# Patient Record
Sex: Male | Born: 1945
Health system: Southern US, Community
[De-identification: ages and names within clinical notes are randomized; demographics above are authoritative.]

## PROBLEM LIST (undated history)

## (undated) DIAGNOSIS — N200 Calculus of kidney: Secondary | ICD-10-CM

## (undated) DIAGNOSIS — N1330 Unspecified hydronephrosis: Secondary | ICD-10-CM

## (undated) DIAGNOSIS — M1990 Unspecified osteoarthritis, unspecified site: Secondary | ICD-10-CM

## (undated) DIAGNOSIS — I219 Acute myocardial infarction, unspecified: Secondary | ICD-10-CM

## (undated) DIAGNOSIS — J45909 Unspecified asthma, uncomplicated: Secondary | ICD-10-CM

## (undated) DIAGNOSIS — E119 Type 2 diabetes mellitus without complications: Secondary | ICD-10-CM

## (undated) DIAGNOSIS — Z965 Presence of tooth-root and mandibular implants: Secondary | ICD-10-CM

## (undated) DIAGNOSIS — I10 Essential (primary) hypertension: Secondary | ICD-10-CM

## (undated) DIAGNOSIS — I252 Old myocardial infarction: Secondary | ICD-10-CM

## (undated) DIAGNOSIS — G709 Myoneural disorder, unspecified: Secondary | ICD-10-CM

## (undated) DIAGNOSIS — R0602 Shortness of breath: Secondary | ICD-10-CM

## (undated) DIAGNOSIS — K219 Gastro-esophageal reflux disease without esophagitis: Secondary | ICD-10-CM

## (undated) DIAGNOSIS — C61 Malignant neoplasm of prostate: Secondary | ICD-10-CM

## (undated) DIAGNOSIS — I2510 Atherosclerotic heart disease of native coronary artery without angina pectoris: Secondary | ICD-10-CM

## (undated) HISTORY — PX: HERNIA REPAIR: SHX51

## (undated) HISTORY — PX: PROSTATE BIOPSY: SHX241

## (undated) HISTORY — PX: BUNIONECTOMY: SHX129

---

## 1997-12-18 ENCOUNTER — Emergency Department (HOSPITAL_COMMUNITY): Admission: EM | Admit: 1997-12-18 | Discharge: 1997-12-18 | Payer: Self-pay | Admitting: Emergency Medicine

## 1998-12-12 ENCOUNTER — Emergency Department (HOSPITAL_COMMUNITY): Admission: EM | Admit: 1998-12-12 | Discharge: 1998-12-12 | Payer: Self-pay | Admitting: Emergency Medicine

## 1998-12-12 ENCOUNTER — Encounter: Payer: Self-pay | Admitting: Emergency Medicine

## 1999-05-23 ENCOUNTER — Encounter: Payer: Self-pay | Admitting: Cardiology

## 1999-05-23 ENCOUNTER — Observation Stay (HOSPITAL_COMMUNITY): Admission: EM | Admit: 1999-05-23 | Discharge: 1999-05-25 | Payer: Self-pay | Admitting: Emergency Medicine

## 1999-05-23 ENCOUNTER — Encounter: Payer: Self-pay | Admitting: Emergency Medicine

## 1999-05-24 ENCOUNTER — Encounter: Payer: Self-pay | Admitting: Cardiology

## 2003-04-28 ENCOUNTER — Emergency Department (HOSPITAL_COMMUNITY): Admission: EM | Admit: 2003-04-28 | Discharge: 2003-04-28 | Payer: Self-pay | Admitting: Emergency Medicine

## 2003-12-05 ENCOUNTER — Ambulatory Visit (HOSPITAL_COMMUNITY): Admission: RE | Admit: 2003-12-05 | Discharge: 2003-12-05 | Payer: Self-pay | Admitting: *Deleted

## 2007-05-16 ENCOUNTER — Encounter: Admission: RE | Admit: 2007-05-16 | Discharge: 2007-05-16 | Payer: Self-pay | Admitting: Cardiology

## 2008-05-16 ENCOUNTER — Inpatient Hospital Stay (HOSPITAL_COMMUNITY): Admission: RE | Admit: 2008-05-16 | Discharge: 2008-05-19 | Payer: Self-pay | Admitting: Orthopedic Surgery

## 2008-05-16 HISTORY — PX: TOTAL KNEE ARTHROPLASTY: SHX125

## 2009-03-20 ENCOUNTER — Inpatient Hospital Stay (HOSPITAL_COMMUNITY): Admission: RE | Admit: 2009-03-20 | Discharge: 2009-03-23 | Payer: Self-pay | Admitting: Orthopedic Surgery

## 2009-03-20 HISTORY — PX: TOTAL HIP ARTHROPLASTY: SHX124

## 2009-09-01 ENCOUNTER — Ambulatory Visit: Payer: Self-pay | Admitting: Internal Medicine

## 2009-09-01 ENCOUNTER — Inpatient Hospital Stay (HOSPITAL_COMMUNITY): Admission: EM | Admit: 2009-09-01 | Discharge: 2009-09-05 | Payer: Self-pay | Admitting: Emergency Medicine

## 2009-09-02 ENCOUNTER — Ambulatory Visit: Payer: Self-pay | Admitting: Infectious Diseases

## 2009-09-04 ENCOUNTER — Encounter (INDEPENDENT_AMBULATORY_CARE_PROVIDER_SITE_OTHER): Payer: Self-pay | Admitting: Cardiology

## 2009-09-18 ENCOUNTER — Encounter: Admission: RE | Admit: 2009-09-18 | Discharge: 2009-09-18 | Payer: Self-pay | Admitting: Cardiology

## 2010-06-28 LAB — COMPREHENSIVE METABOLIC PANEL
ALT: 96 U/L — ABNORMAL HIGH (ref 0–53)
BUN: 15 mg/dL (ref 6–23)
CO2: 22 mEq/L (ref 19–32)
Calcium: 8.6 mg/dL (ref 8.4–10.5)
Creatinine, Ser: 1.39 mg/dL (ref 0.4–1.5)
GFR calc non Af Amer: 51 mL/min — ABNORMAL LOW (ref >60)
Glucose, Bld: 154 mg/dL — ABNORMAL HIGH (ref 70–99)
Total Protein: 7.6 g/dL (ref 6.0–8.3)

## 2010-06-28 LAB — CBC
HCT: 30.8 % — ABNORMAL LOW (ref 39.0–52.0)
HCT: 35.5 % — ABNORMAL LOW (ref 39.0–52.0)
Hemoglobin: 12.2 g/dL — ABNORMAL LOW (ref 13.0–17.0)
MCHC: 34.4 g/dL (ref 30.0–36.0)
MCV: 92.6 fL (ref 78.0–100.0)
MCV: 94 fL (ref 78.0–100.0)
Platelets: 342 10*3/uL (ref 150–400)
RBC: 3.78 MIL/uL — ABNORMAL LOW (ref 4.22–5.81)
RDW: 14 % (ref 11.5–15.5)
RDW: 14.4 % (ref 11.5–15.5)

## 2010-06-28 LAB — CULTURE, BLOOD (ROUTINE X 2)
Culture: NO GROWTH
Culture: NO GROWTH

## 2010-06-28 LAB — BASIC METABOLIC PANEL
BUN: 13 mg/dL (ref 6–23)
BUN: 15 mg/dL (ref 6–23)
Chloride: 102 mEq/L (ref 96–112)
Chloride: 103 mEq/L (ref 96–112)
Creatinine, Ser: 1.32 mg/dL (ref 0.4–1.5)
GFR calc non Af Amer: 55 mL/min — ABNORMAL LOW (ref >60)
Glucose, Bld: 141 mg/dL — ABNORMAL HIGH (ref 70–99)
Glucose, Bld: 156 mg/dL — ABNORMAL HIGH (ref 70–99)
Potassium: 3.5 mEq/L (ref 3.5–5.1)
Potassium: 3.6 mEq/L (ref 3.5–5.1)

## 2010-06-28 LAB — URINALYSIS, ROUTINE W REFLEX MICROSCOPIC
Bilirubin Urine: NEGATIVE
Hgb urine dipstick: NEGATIVE
Nitrite: NEGATIVE
Protein, ur: 100 mg/dL — AB
Specific Gravity, Urine: 1.022 (ref 1.005–1.030)
Urobilinogen, UA: 1 mg/dL (ref 0.0–1.0)

## 2010-06-28 LAB — CARDIAC PANEL(CRET KIN+CKTOT+MB+TROPI)
CK, MB: 0.3 ng/mL (ref 0.3–4.0)
CK, MB: 0.7 ng/mL (ref 0.3–4.0)
CK, MB: 1 ng/mL (ref 0.3–4.0)
Total CK: 68 U/L (ref 7–232)

## 2010-06-28 LAB — HEMOGLOBIN A1C: Hgb A1c MFr Bld: 7.8 % — ABNORMAL HIGH (ref <5.7)

## 2010-06-28 LAB — GLUCOSE, CAPILLARY
Glucose-Capillary: 144 mg/dL — ABNORMAL HIGH (ref 70–99)
Glucose-Capillary: 145 mg/dL — ABNORMAL HIGH (ref 70–99)
Glucose-Capillary: 163 mg/dL — ABNORMAL HIGH (ref 70–99)
Glucose-Capillary: 164 mg/dL — ABNORMAL HIGH (ref 70–99)
Glucose-Capillary: 173 mg/dL — ABNORMAL HIGH (ref 70–99)
Glucose-Capillary: 177 mg/dL — ABNORMAL HIGH (ref 70–99)
Glucose-Capillary: 187 mg/dL — ABNORMAL HIGH (ref 70–99)
Glucose-Capillary: 197 mg/dL — ABNORMAL HIGH (ref 70–99)
Glucose-Capillary: 226 mg/dL — ABNORMAL HIGH (ref 70–99)

## 2010-06-28 LAB — IRON AND TIBC
Saturation Ratios: 17 % — ABNORMAL LOW (ref 20–55)
TIBC: 184 ug/dL — ABNORMAL LOW (ref 215–435)

## 2010-06-28 LAB — URINE MICROSCOPIC-ADD ON

## 2010-06-28 LAB — DIFFERENTIAL
Eosinophils Absolute: 0 10*3/uL (ref 0.0–0.7)
Lymphocytes Relative: 11 % — ABNORMAL LOW (ref 12–46)
Lymphs Abs: 0.9 10*3/uL (ref 0.7–4.0)
Monocytes Relative: 10 % (ref 3–12)
Neutro Abs: 6.5 10*3/uL (ref 1.7–7.7)
Neutrophils Relative %: 80 % — ABNORMAL HIGH (ref 43–77)

## 2010-06-28 LAB — PROTIME-INR
INR: 1.02 (ref 0.00–1.49)
INR: 1.08 (ref 0.00–1.49)
Prothrombin Time: 14.4 seconds (ref 11.6–15.2)

## 2010-06-28 LAB — FERRITIN: Ferritin: 948 ng/mL — ABNORMAL HIGH (ref 22–322)

## 2010-06-28 LAB — RETICULOCYTES: Retic Ct Pct: 0.4 % (ref 0.4–3.1)

## 2010-07-13 LAB — COMPREHENSIVE METABOLIC PANEL
ALT: 18 U/L (ref 0–53)
AST: 19 U/L (ref 0–37)
Albumin: 4.1 g/dL (ref 3.5–5.2)
CO2: 28 mEq/L (ref 19–32)
Calcium: 9.2 mg/dL (ref 8.4–10.5)
GFR calc Af Amer: >60 mL/min (ref >60)
Sodium: 140 mEq/L (ref 135–145)
Total Protein: 7.6 g/dL (ref 6.0–8.3)

## 2010-07-13 LAB — GLUCOSE, CAPILLARY
Glucose-Capillary: 113 mg/dL — ABNORMAL HIGH (ref 70–99)
Glucose-Capillary: 117 mg/dL — ABNORMAL HIGH (ref 70–99)
Glucose-Capillary: 126 mg/dL — ABNORMAL HIGH (ref 70–99)
Glucose-Capillary: 133 mg/dL — ABNORMAL HIGH (ref 70–99)
Glucose-Capillary: 135 mg/dL — ABNORMAL HIGH (ref 70–99)
Glucose-Capillary: 136 mg/dL — ABNORMAL HIGH (ref 70–99)
Glucose-Capillary: 137 mg/dL — ABNORMAL HIGH (ref 70–99)
Glucose-Capillary: 145 mg/dL — ABNORMAL HIGH (ref 70–99)
Glucose-Capillary: 146 mg/dL — ABNORMAL HIGH (ref 70–99)
Glucose-Capillary: 148 mg/dL — ABNORMAL HIGH (ref 70–99)
Glucose-Capillary: 159 mg/dL — ABNORMAL HIGH (ref 70–99)
Glucose-Capillary: 164 mg/dL — ABNORMAL HIGH (ref 70–99)

## 2010-07-13 LAB — TYPE AND SCREEN: ABO/RH(D): A POS

## 2010-07-13 LAB — BASIC METABOLIC PANEL WITH GFR
BUN: 5 mg/dL — ABNORMAL LOW (ref 6–23)
CO2: 30 meq/L (ref 19–32)
Calcium: 8.5 mg/dL (ref 8.4–10.5)
Chloride: 101 meq/L (ref 96–112)
Creatinine, Ser: 0.74 mg/dL (ref 0.4–1.5)
GFR calc non Af Amer: >60 mL/min
Glucose, Bld: 162 mg/dL — ABNORMAL HIGH (ref 70–99)
Potassium: 3.7 meq/L (ref 3.5–5.1)
Sodium: 136 meq/L (ref 135–145)

## 2010-07-13 LAB — PROTIME-INR
INR: 1.46 (ref 0.00–1.49)
Prothrombin Time: 17.6 s — ABNORMAL HIGH (ref 11.6–15.2)

## 2010-07-13 LAB — CBC
HCT: 27.5 % — ABNORMAL LOW (ref 39.0–52.0)
HCT: 29.7 % — ABNORMAL LOW (ref 39.0–52.0)
HCT: 30.5 % — ABNORMAL LOW (ref 39.0–52.0)
Hemoglobin: 10 g/dL — ABNORMAL LOW (ref 13.0–17.0)
Hemoglobin: 10.4 g/dL — ABNORMAL LOW (ref 13.0–17.0)
MCHC: 33.7 g/dL (ref 30.0–36.0)
MCHC: 33.8 g/dL (ref 30.0–36.0)
MCHC: 34.1 g/dL (ref 30.0–36.0)
MCV: 96.3 fL (ref 78.0–100.0)
MCV: 96.4 fL (ref 78.0–100.0)
MCV: 96.4 fL (ref 78.0–100.0)
Platelets: 330 10*3/uL (ref 150–400)
Platelets: 361 K/uL (ref 150–400)
Platelets: 447 10*3/uL — ABNORMAL HIGH (ref 150–400)
RBC: 3.09 MIL/uL — ABNORMAL LOW (ref 4.22–5.81)
RBC: 3.85 MIL/uL — ABNORMAL LOW (ref 4.22–5.81)
RDW: 13.2 % (ref 11.5–15.5)
RDW: 13.3 % (ref 11.5–15.5)
RDW: 13.3 % (ref 11.5–15.5)
WBC: 5.1 K/uL (ref 4.0–10.5)
WBC: 5.7 10*3/uL (ref 4.0–10.5)

## 2010-07-13 LAB — URINALYSIS, ROUTINE W REFLEX MICROSCOPIC
Bilirubin Urine: NEGATIVE
Glucose, UA: NEGATIVE mg/dL
Hgb urine dipstick: NEGATIVE
Ketones, ur: NEGATIVE mg/dL
Nitrite: NEGATIVE
Protein, ur: NEGATIVE mg/dL
Specific Gravity, Urine: 1.023 (ref 1.005–1.030)
Urobilinogen, UA: 1 mg/dL (ref 0.0–1.0)
pH: 6 (ref 5.0–8.0)

## 2010-07-13 LAB — BASIC METABOLIC PANEL
BUN: 5 mg/dL — ABNORMAL LOW (ref 6–23)
CO2: 29 mEq/L (ref 19–32)
Chloride: 102 mEq/L (ref 96–112)
Glucose, Bld: 149 mg/dL — ABNORMAL HIGH (ref 70–99)
Potassium: 3.5 mEq/L (ref 3.5–5.1)

## 2010-07-13 LAB — APTT: aPTT: 27 s (ref 24–37)

## 2010-07-27 LAB — COMPREHENSIVE METABOLIC PANEL
ALT: 33 U/L (ref 0–53)
AST: 32 U/L (ref 0–37)
Albumin: 4 g/dL (ref 3.5–5.2)
Alkaline Phosphatase: 77 U/L (ref 39–117)
Chloride: 106 mEq/L (ref 96–112)
GFR calc Af Amer: >60 mL/min (ref >60)
Potassium: 3.4 mEq/L — ABNORMAL LOW (ref 3.5–5.1)
Total Bilirubin: 0.7 mg/dL (ref 0.3–1.2)

## 2010-07-27 LAB — BASIC METABOLIC PANEL
BUN: 4 mg/dL — ABNORMAL LOW (ref 6–23)
CO2: 27 mEq/L (ref 19–32)
CO2: 28 mEq/L (ref 19–32)
Calcium: 8.5 mg/dL (ref 8.4–10.5)
Chloride: 99 mEq/L (ref 96–112)
Chloride: 98 mEq/L (ref 96–112)
Creatinine, Ser: 0.76 mg/dL (ref 0.4–1.5)
GFR calc Af Amer: >60 mL/min (ref >60)
Glucose, Bld: 137 mg/dL — ABNORMAL HIGH (ref 70–99)
Sodium: 135 mEq/L (ref 135–145)

## 2010-07-27 LAB — URINALYSIS, ROUTINE W REFLEX MICROSCOPIC
Nitrite: NEGATIVE
Specific Gravity, Urine: 1.019 (ref 1.005–1.030)
pH: 5.5 (ref 5.0–8.0)

## 2010-07-27 LAB — CBC
HCT: 41 % (ref 39.0–52.0)
HCT: 32.9 % — ABNORMAL LOW (ref 39.0–52.0)
Hemoglobin: 11.5 g/dL — ABNORMAL LOW (ref 13.0–17.0)
MCHC: 34.5 g/dL (ref 30.0–36.0)
MCHC: 34.8 g/dL (ref 30.0–36.0)
MCV: 94.9 fL (ref 78.0–100.0)
Platelets: 377 10*3/uL (ref 150–400)
Platelets: 329 10*3/uL (ref 150–400)
Platelets: 348 10*3/uL (ref 150–400)
RBC: 3.14 MIL/uL — ABNORMAL LOW (ref 4.22–5.81)
RBC: 3.47 MIL/uL — ABNORMAL LOW (ref 4.22–5.81)
RDW: 13.2 % (ref 11.5–15.5)
WBC: 3.8 10*3/uL — ABNORMAL LOW (ref 4.0–10.5)
WBC: 5.8 10*3/uL (ref 4.0–10.5)
WBC: 6.5 10*3/uL (ref 4.0–10.5)

## 2010-07-27 LAB — GLUCOSE, CAPILLARY
Glucose-Capillary: 126 mg/dL — ABNORMAL HIGH (ref 70–99)
Glucose-Capillary: 140 mg/dL — ABNORMAL HIGH (ref 70–99)
Glucose-Capillary: 137 mg/dL — ABNORMAL HIGH (ref 70–99)
Glucose-Capillary: 138 mg/dL — ABNORMAL HIGH (ref 70–99)
Glucose-Capillary: 158 mg/dL — ABNORMAL HIGH (ref 70–99)
Glucose-Capillary: 162 mg/dL — ABNORMAL HIGH (ref 70–99)

## 2010-07-27 LAB — PROTIME-INR
INR: 1.2 (ref 0.00–1.49)
Prothrombin Time: 19.8 seconds — ABNORMAL HIGH (ref 11.6–15.2)

## 2010-07-27 LAB — TYPE AND SCREEN

## 2010-08-24 NOTE — Op Note (Signed)
NAMEPRESTON, [name] NO.:  [id number]   MEDICAL RECORD NO.:  [id number]          PATIENT TYPE:  INP   LOCATION:  0003                         FACILITY:  [facility]   PHYSICIAN:  Ollen Gross, M.D.    DATE OF BIRTH:  1946/03/27   DATE OF PROCEDURE:  05/16/2008  DATE OF DISCHARGE:                               OPERATIVE REPORT   PREOPERATIVE DIAGNOSIS:  Osteoarthritis left knee.   POSTOPERATIVE DIAGNOSIS:  Osteoarthritis left knee.   PROCEDURE:  Left total knee arthroplasty.   SURGEON:  Ollen Gross, M.D.   ASSISTANT:  Avel Peace PA-C.   ANESTHESIA:  Spinal.   ESTIMATED BLOOD LOSS:  Minimal.   DRAIN:  None.   TOURNIQUET TIME:  Forty minutes at 300 mmHg.   COMPLICATIONS:  None.   CONDITION:  Stable to recovery.   BRIEF CLINICAL NOTE:  [name] is a 65 year old male with end-stage  arthritis of the left knee with progressively worsening pain and  dysfunction.  He has failed nonoperative management and presents now for  left total knee arthroplasty.   PROCEDURE IN DETAIL:  After the successful administration of spinal  anesthetic, a tourniquet is placed on his left thigh and his left lower  extremity is prepped and draped in the usual sterile fashion.  The  extremity is wrapped in Esmarch, knee flexed, tourniquet inflated to 300  mmHg.  A midline incision is made with a 10-blade through subcutaneous  tissue to the level of the extensor mechanism.  A fresh blade is used  make a medial parapatellar arthrotomy.  Soft tissue over the proximal  and medial tibia is subperiosteally elevated to the joint line with the  knife and into the semimembranosus bursa with a Cobb elevator.  Soft  tissue laterally is elevated with attention being paid to avoiding the  patellar tendon on the tibial tubercle.  Patella is subluxed laterally,  knee flexed 90 degrees in ACL and PCL and removed.  A drill was used to  create a starting hole in the distal femur and the canal  was thoroughly  irrigated.  The 5 degree left valgus alignment guide is placed and  referencing off the posterior condyle rotation is marked with a black  pen to remove 10-mm off the distal femur.  Distal femoral resection is  made an oscillating saw.  A sizing block is placed, size 4 is most  appropriate.  Rotations marked off the epicondylar axis.  A size 4  cutting block is placed and the anterior, posterior and chamfer cuts  made.   Tibia subluxed forward and menisci removed.  Extramedullary tibial  alignment guide is placed referencing proximally at the medial aspect of  the tibial tubercle and distally along the second metatarsal axis and  tibial crest.  Blocks pinned to remove 10-mm off the non-deficient  lateral side.  Tibial resection is made with an oscillating saw.  The  size 4 is the most appropriate tibial component and the proximal tibia  is prepared with the modular drill and keel punch for the size 4.  Femoral preparation is completed with the intercondylar  cut for the 4.   A size 4 mobile bearing tibial trial, size 4 posterior stabilized  femoral trial and a 10-mm posterior stabilized rotating platform insert  trial are placed.  With a 10, there was slight hyperextension, with a  12.5 which allowed for full extension with excellent varus-valgus  anterior and posterior balance throughout full range of motion.  Patella  was everted, thickness measured to be 25-mm.  Freehand resection was  taken to 13-mm, a 41 template is placed, lug holes are drilled, trial  patella is placed and it tracks normally.  Osteophytes removed off of  the posterior femur with the trial in place.  All trials were removed  and a cut bone surface is prepared with pulsatile lavage.  Cement is  mixed and once ready for implantation the size 4 mobile-bearing tibia,  size 4 posterior stabilized femur and 41 patella are cemented into  place, patella was held with a clamp.  Trial 12.5-mm inserts  placed,  knee held in full extension and all extruded cement removed.  When the  cement was fully hardened, then the permanent 12.5-mm posterior  stabilized rotating platform insert is placed into the tibial tray care.  The wound was copiously irrigated with saline solution.  FloSeal was  injected on the posterior capsule in medial and lateral gutters and  suprapatellar area.  Moist sponge is placed and tourniquet released with  a total time of 4 minutes.  Sponge is held for 2 minutes then removed.  Minimal bleeding is encountered.  The bleeding edges encountered stopped  by electrocautery.  The wound is again irrigated and the arthrotomy  closed with interrupted #1 PDS.  Flexion against gravity is 135 degrees.  Subcu is closed with interrupted 2-0 Vicryl and subcuticular with  running 4-0 Monocryl.  The incision is then cleaned and dried and Steri-  Strips and a bulky sterile dressing are applied.  He is then placed into  a knee immobilizer, awakened and transported to recovery in stable  condition.      Ollen Gross, M.D.  Electronically Signed     FA/MEDQ  D:  05/16/2008  T:  05/16/2008  Job:  [id number]

## 2010-08-27 NOTE — Discharge Summary (Signed)
NAMETAREEK, SABO              ACCOUNT NO.:  [id number]   MEDICAL RECORD NO.:  [id number]          PATIENT TYPE:  INP   LOCATION:  1616                         FACILITY:  [facility]   PHYSICIAN:  Ollen Gross, M.D.    DATE OF BIRTH:  February 25, 1946   DATE OF ADMISSION:  05/16/2008  DATE OF DISCHARGE:  05/19/2008                               DISCHARGE SUMMARY   ADMISSION DIAGNOSES:  1. Osteoarthritis left knee.  2. Hypertension.  3. Coronary arterial disease.  4. Hypercholesterolemia.  5. History of myocardial infarction.  6. Diabetes.  7. Arthritis.   DISCHARGE DIAGNOSES:  1. Osteoarthritis left knee status post left total knee replacement      arthroplasty.  2. Mild postoperative acute blood loss anemia.  3. Mild hyponatremia.  4. Hypertension.  5. Coronary arterial disease.  6. Hypercholesterolemia.  7. History of myocardial infarction.  8. Diabetes.  9. Arthritis.   PROCEDURE:  May 16, 2008, left total knee.   SURGEON:  Ollen Gross, M.D.   ASSISTANT:  Alexzandrew L. Perkins, P.A.C.   ANESTHESIA:  Spinal anesthesia with Duramorph added.  Tourniquet time 40  minutes.   CONSULTS:  None.   BRIEF HISTORY:  Mr. Dusenbery is a 65 year old male with end-stage arthritis  of the left knee, progressive worsening pain and dysfunction, failed  operative management and now presents for a total knee arthroplasty.   LABORATORY DATA:  Preoperative CBC:  Hemoglobin 13.9, hematocrit 41.0,  white cell count 3.8, platelets 377, hemoglobin 11.5 and drifted down to  11.4.  Last known H and H 10.3 and 30.0.  PT/PTT preoperative 13.2 and  25 respectively.  INR 1.0.  Serial pro-times followed.  Last known  PT/INR 23.6 and 2.0.  Chem panel on admission:  Minimally low potassium  at 3.4, but did come back up to a normal level of 4.  Remaining chem  panel within normal limits.  Sodium did drop from 135 to 132.  Preop UA  positive glucose, otherwise negative.  Blood group type A+.   DIAGNOSTICS:  Previous EKG on the chart dated March 17, 2008:  Normal  sinus rhythm within normal limits.  History of coronary arterial  disease, confirmed.   HOSPITAL COURSE:  The patient was admitted to [facility],  taken to the OR and underwent the above stated procedure without  complication.  The patient tolerated the procedure well.  Later  transferred to the recovery room on the orthopedic floor.  Started on  PCA and p.o. analgesic for pain control following surgery.  Given 24  hours postoperative IV antibiotics. Given Coumadin for DVT prophylaxis.  Actually the patient did very well and was sitting up on the side of  bed.  On day 1, started walking about 160 feet.  Weaned over to p.o.  meds by day 2.  PCA was discontinued.  Dressing changed and incision  looked good.  Hemoglobin was stable.  No complaints.  By day 3, the  patient had met all of his goals, walking over 145 feet.  Weaned over to  p.o. meds.  Hemoglobin was stable at 10.3, tolerating  his meds and  discharged home.   DISPOSITION:  The patient was discharged home on May 19, 2008.   DISCHARGE MEDICATIONS:  1. Percocet.  2. Robaxin.  3. Coumadin.   DIET:  Heart-healthy cardiac diet.   ACTIVITY:  Weightbearing as tolerated total knee protocol.  Home health  PT and home health nursing.   FOLLOW UP:  Followup in 2 weeks.   DISPOSITION:  Home.   CONDITION ON DISCHARGE:  Improved.      Alexzandrew L. Perkins, P.A.C.      Ollen Gross, M.D.  Electronically Signed    ALP/MEDQ  D:  06/19/2008  T:  06/19/2008  Job:  [id number]   cc:   Ollen Gross, M.D.  Fax: [phone]   Lindaann Slough, MD   Karie Soda Joseph Art, M.D.  Fax: [phone]

## 2010-08-27 NOTE — H&P (Signed)
NAMEPARV, MANTHEY NO.:  [id number]   MEDICAL RECORD NO.:  [id number]          PATIENT TYPE:  INP   LOCATION:  0003                         FACILITY:  [facility]   PHYSICIAN:  Ollen Gross, M.D.    DATE OF BIRTH:  1945/04/20   DATE OF ADMISSION:  05/16/2008  DATE OF DISCHARGE:                              HISTORY & PHYSICAL   CHIEF COMPLAINT:  Left knee pain.   HISTORY OF PRESENT ILLNESS:  The patient is a 65 year old male who has  been seeing Dr. Lequita Halt for ongoing left knee pain.  He has been treated  conservatively in the past for significant arthritis, had multiple  cortisone injections, also treated by viscosupplementation;  unfortunately he has not gotten good benefit.  He is known to have  advanced arthritis and felt he has reached the point where he could  benefit from undergoing surgical intervention.  Risks and benefits have  been discussed, he elected to proceed with surgery.  He has been seen  preoperatively by Dr. Nile Riggs, recommended that we check his blood  sugars and watch his blood pressure but otherwise cleared for surgery.   ALLERGIES:  NO KNOWN DOCUMENTED ALLERGIES.   CURRENT MEDICATIONS:  Caduet, carvedilol, Avalide, metformin,  diclofenac.   PAST MEDICAL HISTORY:  1. Hypertension.  2. Coronary arterial disease/heart disease.  3. Hypercholesterolemia.  4. History of myocardial infarction.  5. Diabetes.  6. Arthritis.   PAST SURGICAL HISTORY:  Undergone bypass surgery and bunion extraction.   FAMILY HISTORY:  Father with history of cancer, heart disease, diabetes,  congestive heart failure.  Mother with history of cancer and also broken  hip.   SOCIAL HISTORY:  Widowed, retired, current smoker, 12 ounces of beer a  day, lives alone.   REVIEW OF SYSTEMS:  GENERAL:  No fevers, chills (although he does have  occasional night sweats).  NEURO:  Sometimes ringing in his ears, no  seizures, syncope or paralysis.  RESPIRATORY:  A little  bit of shortness  of breath on exertion but no shortness of breath at rest.  No productive  cough or hemoptysis.  CARDIOVASCULAR:  No chest pain or orthopnea.  GI:  No nausea, vomiting or constipation.  GU:  A little bit of nocturia, no  dysuria or hematuria.  MUSCULOSKELETAL:  Joint pain and stiffness.   PHYSICAL EXAMINATION:  VITAL SIGNS:  Pulse 72, respirations 12, blood  pressure 160/80.  GENERAL:  65 year old African American male, tall frame, well-nourished,  well-developed, no acute distress.  HEENT:  Normocephalic/atraumatic, pupils are round and reactive, EOMs  intact, noted to wear full upper dentures.  NECK:  Supple.  CHEST:  Clear.  HEART:  Regular rhythm, mo murmur, S1-S2 noted.  ABDOMEN:  Soft, nontender, bowel sounds present.  Rectal, breasts, genitalia not done, not pertinent to present illness.  EXTREMITIES:  Left knee marked crepitus, range of motion 5 to 125, no  instability.   IMPRESSION:  Osteoarthritis left knee.   PLAN:  The patient admitted to [facility] to undergo a left  total knee replacement arthroplasty.  Surgery will be performed by Dr.  Homero Fellers Aluisio.      Alexzandrew L. Perkins, P.A.C.      Ollen Gross, M.D.  Electronically Signed    ALP/MEDQ  D:  05/15/2008  T:  05/16/2008  Job:  [id number]   cc:   Dr. Ralph Dowdy. Joseph Art, M.D.  Fax: [phone]   Ollen Gross, M.D.  Fax: [phone]

## 2011-10-10 ENCOUNTER — Encounter (HOSPITAL_COMMUNITY): Payer: Self-pay | Admitting: *Deleted

## 2011-10-10 ENCOUNTER — Emergency Department (HOSPITAL_COMMUNITY)
Admission: EM | Admit: 2011-10-10 | Discharge: 2011-10-10 | Disposition: A | Discharge status: Home / Self Care | Payer: Medicare PPO | Attending: Emergency Medicine | Admitting: Emergency Medicine

## 2011-10-10 DIAGNOSIS — E119 Type 2 diabetes mellitus without complications: Secondary | ICD-10-CM | POA: Insufficient documentation

## 2011-10-10 DIAGNOSIS — I10 Essential (primary) hypertension: Secondary | ICD-10-CM | POA: Insufficient documentation

## 2011-10-10 DIAGNOSIS — L290 Pruritus ani: Secondary | ICD-10-CM | POA: Insufficient documentation

## 2011-10-10 HISTORY — DX: Essential (primary) hypertension: I10

## 2011-10-10 HISTORY — DX: Unspecified osteoarthritis, unspecified site: M19.90

## 2011-10-10 MED ORDER — MEBENDAZOLE 100 MG PO CHEW: 100.0000 mg | CHEWABLE_TABLET | Freq: Once | ORAL | Status: AC

## 2011-10-10 MED ORDER — HYDROCORTISONE ACETATE 25 MG RE SUPP: 25.0000 mg | Freq: Two times a day (BID) | RECTAL | Status: AC

## 2011-10-10 NOTE — ED Provider Notes (Signed)
History     CSN: [id number]  Arrival date & time 10/10/11  1648   First MD Initiated Contact with Patient 10/10/11 1759      Chief Complaint  Patient presents with  . Anal Itching    (Consider location/radiation/quality/duration/timing/severity/associated sxs/prior treatment) Patient is a 66 y.o. male presenting with vaginal itching. The history is provided by the patient.  Vaginal Itching The current episode started more than 1 month ago. The problem occurs daily. The problem has been unchanged. Pertinent negatives include no abdominal pain, change in bowel habit, nausea or rash. The treatment provided no relief.   66 y/o male INAD cc/o rectal itching x1 month. Denies pain with defecation, and change in bowel habits, or any blood in stool or dark stool. Itching is significantly worse at night. Pt say PCP  Today who referred him to GI for colonoscopy but provided no symptomatic relief.  Pt States PCP performed DRE and stated that Pt's hemorrhoids are swollen.   Past Medical History  Diagnosis Date  . Hypertension   . Diabetes mellitus   . Arthritis     Past Surgical History  Procedure Date  . Total hip arthroplasty   . Total knee arthroplasty     No family history on file.  History  Substance Use Topics  . Smoking status: Never Smoker   . Smokeless tobacco: Not on file  . Alcohol Use: Yes     socially      Review of Systems  Gastrointestinal: Negative for nausea, abdominal pain, diarrhea, constipation, blood in stool, anal bleeding, rectal pain and change in bowel habit.  Skin: Negative for rash.  All other systems reviewed and are negative.    Allergies  Review of patient's allergies indicates no known allergies.  Home Medications   Current Outpatient Rx  Name Route Sig Dispense Refill  . AMLODIPINE BESYLATE 10 MG PO TABS Oral Take 10 mg by mouth daily.    . ATORVASTATIN CALCIUM 20 MG PO TABS Oral Take 20 mg by mouth daily.    Marland Kitchen CARVEDILOL 25 MG PO TABS  Oral Take 25 mg by mouth 2 (two) times daily with a meal.    . GLIMEPIRIDE 4 MG PO TABS Oral Take 4 mg by mouth daily before breakfast.    . METFORMIN HCL 500 MG PO TABS Oral Take 1,000 mg by mouth daily.    Marland Kitchen HYDROCORTISONE ACETATE 25 MG RE SUPP Rectal Place 1 suppository (25 mg total) rectally 2 (two) times daily. 12 suppository 0  . MEBENDAZOLE 100 MG PO CHEW Oral Chew 1 tablet (100 mg total) by mouth once. You make take the second tablet in 14 days if itching dose not subside. 1 tablet 1    BP 166/79  Pulse 79  Temp 98.4 F (36.9 C) (Oral)  Resp 24  SpO2 98%  Physical Exam  Vitals reviewed. Constitutional: He is oriented to person, place, and time. He appears well-developed and well-nourished. No distress.  HENT:  Head: Normocephalic.  Eyes: Conjunctivae and EOM are normal.  Cardiovascular: Normal rate, regular rhythm and normal heart sounds.   Pulmonary/Chest: Effort normal.  Abdominal: Soft. Bowel sounds are normal.  Musculoskeletal: Normal range of motion.  Neurological: He is alert and oriented to person, place, and time.  Psychiatric: He has a normal mood and affect.    ED Course  Procedures (including critical care time)  Labs Reviewed - No data to display No results found.   1. Rectal itching  MDM  66 y/o with rectal itching x4 weeks, significantly worse at night. Pt has h/o hemorrhoids. Denies and rectal pain, change in bowel habits or blood in stool. Pt had colonoscopy scheduled for August but would like symptomatic relief. Will treat for both pinworm and hemorrhoids. Explained that Dx is not proven and Pt opted for treatment for both.  Pt verbalized understanding and agrees with care plan. Outpatient follow-up and return precautions given.          Wynetta Emery, PA-C 10/10/11 1832

## 2011-10-10 NOTE — Discharge Instructions (Signed)
Follow with Dr. Lona Kettle for colonoscopy in August.   Anal Itching Itching around the anus is a common problem. It is usually not dangerous. It often is caused by skin irritation from stool, moisture, soaps, or clothing. Other causes are pinworms, especially if the itching is worse at night. In adults, the itching may be due to hemorrhoids. In some cases, the cause is unknown. Itching usually can be controlled by keeping the anal area clean and dry. CAUSES   Loose or sticky stool from diarrhea or rectal leakage.   Hemorrhoids. They allow stool to stick to the rectal area.   Certain foods. Be sure to discuss your diet with your caregiver.   Dry skin or skin diseases.   Infections such as a local yeast infection or certain sexually transmitted diseases (STDs).   Worms (parasites).   Diseases of the anus. These include abscesses, fissures, fistulas or cancer.   Sometimes a cause cannot be found.  DIAGNOSIS   Your caregiver will take your history and examine you. A careful exam of the anus is important. Your caregiver will inspect the outer area of your anus and will do a rectal exam.   Sometimes your caregiver will need to look inside the anus. This is a simple procedure that may be a little uncomfortable but usually does not require anesthesia.   If abnormalities are found, a biopsy might be done or you may be referred to a specialist.  TREATMENT  The treatment of your condition will depend on the cause.   Your caregiver will advise you on treatment of any disease found.   If you have rectal leakage or loose stools, a diet high in fiber or a fiber supplement should improve your condition.   Avoid foods or substances that might be causing your itching.   Gentle care of your anal area is important to avoid worsening the irritation.  HOME CARE INSTRUCTIONS  Do not rub or scratch the area. This makes the itching worse. It could worsen conditions such as parasite infections.   After  every bowel movement and at bedtime, gently clean the anal area. Bathe or use moistened tissue or soft wash cloth. You also may use pre-moistened anal cleansing pads or tissues made for cleaning up babies. Do not use soap. Gently pat the area dry.   Wear underwear made of cotton or with a cotton crotch. Do not wear tight fitting clothes or underwear that keep moisture in.   Avoid foods and beverages that may cause anal itching. Examples are beer, tea, coffee, milk, cola, tomatoes, citrus fruits, nuts, chocolate, and spicy foods.   Be sure you have enough fiber in your diet.   Do not use products that may irritate the anal skin. These include perfumed or colored toilet paper, deodorant sprays, and perfumed soaps.   Do not use any medication on the anal area unless advised. Some products may make itching worse.   It may take a few weeks for things to fully improve.  SEEK MEDICAL CARE IF:   The itching is not better in 3 to 4 days or is getting worse.   The skin around the anus becomes red or tender. This may be a sign of infection.   You have pain in the anus, especially with bowel movement.  SEEK IMMEDIATE MEDICAL CARE IF:  You have increasing pain in the anus or in the abdomen.   You have blood coming from the anus.   You have pus or other discharge  from the anus.   You develop a fever.  Document Released: 03/25/2000 Document Revised: 03/17/2011 Document Reviewed: 04/01/2008 [facility] Patient Information 2012 [place], [place].

## 2011-10-10 NOTE — Progress Notes (Signed)
Pt confirms [name] as pcp updated EPIC

## 2011-10-10 NOTE — ED Notes (Signed)
Pharm D (Mia) called because Vermox is no longer available at [facility] or any other pharmacy she called.  I spoke with Benn Moulder PA and she changed the order to Albenza 400 mg tab x 1 dose and then repeat in 2 weeks.  This was called back to Mia at [facility] at [phone]

## 2011-10-10 NOTE — ED Notes (Signed)
Pt reports anal itching x1 month. States he saw pcp today, told pt he could possibly have hemorrhoids or pin-worms. States he has used powder to treat itching, thought rectum was "chapped".  Denies pain

## 2011-10-12 NOTE — ED Provider Notes (Signed)
Medical screening examination/treatment/procedure(s) were performed by non-physician practitioner and as supervising physician I was immediately available for consultation/collaboration.  Suzi Roots, MD 10/12/11 [phone]

## 2012-02-23 ENCOUNTER — Other Ambulatory Visit: Payer: Self-pay | Admitting: Orthopedic Surgery

## 2012-02-23 MED ORDER — DEXAMETHASONE SODIUM PHOSPHATE 10 MG/ML IJ SOLN: 10.0000 mg | Freq: Once | INTRAMUSCULAR | Status: DC

## 2012-02-23 MED ORDER — BUPIVACAINE LIPOSOME 1.3 % IJ SUSP
20.0000 mL | Freq: Once | INTRAMUSCULAR | Status: DC
Start: 2012-02-23 — End: 2012-02-23

## 2012-02-23 NOTE — Progress Notes (Signed)
Preoperative surgical orders have been place into the Epic hospital system for BEAUDEN TREMONT on 02/23/2012, 11:05 AM  by Patrica Duel for surgery on 04/09/12.  Preop Total Hip orders including Experel Injecion, IV Tylenol, and IV Decadron as long as there are no contraindications to the above medications. Avel Peace, PA-C

## 2012-04-02 ENCOUNTER — Encounter (HOSPITAL_COMMUNITY)
Admission: RE | Admit: 2012-04-02 | Discharge: 2012-04-02 | Disposition: A | Discharge status: Home / Self Care | Payer: Medicare PPO | Source: Ambulatory Visit | Attending: Orthopedic Surgery | Admitting: Orthopedic Surgery

## 2012-04-02 ENCOUNTER — Ambulatory Visit (HOSPITAL_COMMUNITY)
Admission: RE | Admit: 2012-04-02 | Discharge: 2012-04-02 | Disposition: A | Discharge status: Home / Self Care | Payer: Medicare PPO | Source: Ambulatory Visit | Attending: Orthopedic Surgery | Admitting: Orthopedic Surgery

## 2012-04-02 ENCOUNTER — Encounter (HOSPITAL_COMMUNITY): Payer: Self-pay

## 2012-04-02 DIAGNOSIS — Z01818 Encounter for other preprocedural examination: Secondary | ICD-10-CM | POA: Insufficient documentation

## 2012-04-02 HISTORY — DX: Acute myocardial infarction, unspecified: I21.9

## 2012-04-02 HISTORY — DX: Shortness of breath: R06.02

## 2012-04-02 HISTORY — DX: Myoneural disorder, unspecified: G70.9

## 2012-04-02 LAB — COMPREHENSIVE METABOLIC PANEL
ALT: 18 U/L (ref 0–53)
AST: 20 U/L (ref 0–37)
Albumin: 3.9 g/dL (ref 3.5–5.2)
Alkaline Phosphatase: 76 U/L (ref 39–117)
BUN: 14 mg/dL (ref 6–23)
CO2: 25 mEq/L (ref 19–32)
Calcium: 9.2 mg/dL (ref 8.4–10.5)
Chloride: 103 mEq/L (ref 96–112)
Creatinine, Ser: 0.95 mg/dL (ref 0.50–1.35)
GFR calc Af Amer: >90 mL/min (ref >90)
GFR calc non Af Amer: 85 mL/min — ABNORMAL LOW (ref >90)
Glucose, Bld: 94 mg/dL (ref 70–99)
Potassium: 4 mEq/L (ref 3.5–5.1)
Sodium: 137 mEq/L (ref 135–145)
Total Bilirubin: 0.3 mg/dL (ref 0.3–1.2)
Total Protein: 7.8 g/dL (ref 6.0–8.3)

## 2012-04-02 LAB — URINALYSIS, ROUTINE W REFLEX MICROSCOPIC
Bilirubin Urine: NEGATIVE
Glucose, UA: NEGATIVE mg/dL
Hgb urine dipstick: NEGATIVE
Ketones, ur: NEGATIVE mg/dL
Leukocytes, UA: NEGATIVE
Nitrite: NEGATIVE
Protein, ur: NEGATIVE mg/dL
Specific Gravity, Urine: 1.018 (ref 1.005–1.030)
Urobilinogen, UA: 1 mg/dL (ref 0.0–1.0)
pH: 7.5 (ref 5.0–8.0)

## 2012-04-02 LAB — PROTIME-INR
INR: 0.94 (ref 0.00–1.49)
Prothrombin Time: 12.5 seconds (ref 11.6–15.2)

## 2012-04-02 LAB — CBC
Platelets: 413 10*3/uL — ABNORMAL HIGH (ref 150–400)
RBC: 4.16 MIL/uL — ABNORMAL LOW (ref 4.22–5.81)
RDW: 14 % (ref 11.5–15.5)
WBC: 5.1 10*3/uL (ref 4.0–10.5)

## 2012-04-02 LAB — APTT: aPTT: 29 seconds (ref 24–37)

## 2012-04-02 NOTE — Patient Instructions (Addendum)
JAILEN COWARD  04/02/2012                           YOUR PROCEDURE IS SCHEDULED ON: 04/09/12               PLEASE REPORT TO SHORT STAY CENTER AT :  1:15 PM               CALL THIS NUMBER IF ANY PROBLEMS THE DAY OF SURGERY :               [phone]                      REMEMBER:   Do not eat food or drink liquids AFTER MIDNIGHT  May have clear liquids UNTIL 6 HOURS BEFORE SURGERY (9:45 AM)  Clear liquids include soda, tea, black coffee, apple or grape juice, broth.  Take these medicines the morning of surgery with A SIP OF WATER:  AMLODIPINE / LIPITOR / CARVEDILOL / OXYCODONE IF NEEDED   Do not wear jewelry, make-up   Do not wear lotions, powders, or perfumes.   Do not shave legs or underarms 12 hrs. before surgery (men may shave face)  Do not bring valuables to the hospital.  Contacts, dentures or bridgework may not be worn into surgery.  Leave suitcase in the car. After surgery it may be brought to your room.  For patients admitted to the hospital more than one night, checkout time is 11:00                          The day of discharge.   Patients discharged the day of surgery will not be allowed to drive home                             If going home same day of surgery, must have someone stay with you first                           24 hrs at home and arrange for some one to drive you home from hospital.    Special Instructions:   Please read over the following fact sheets that you were given:               1. MRSA  INFORMATION                      2. [place] PREPARING FOR SURGERY SHEET               3. INCENTIVE SPIROMETER                                                X_____________________________________________________________________        Failure to follow these instructions may result in cancellation of your surgery

## 2012-04-02 NOTE — Progress Notes (Signed)
I spoke with Dr. Simonne Come concerning pts elevated WBC / temp  / pulse Dr. Simonne Come request repeat WBC to be done day of surgery - CBC order placed into EPIC

## 2012-04-08 ENCOUNTER — Other Ambulatory Visit: Payer: Self-pay | Admitting: Orthopedic Surgery

## 2012-04-08 NOTE — H&P (Signed)
[name]  DOB: 10/03/1945 Widowed / Language: English / Race: Black or African American Male  Date of Admission:  04/09/2012  Chief Complaint:  Right Hip Pain  History of Present Illness The patient is a 66 year old male who comes in for a preoperative History and Physical. The patient is scheduled for a right total hip arthroplasty to be performed by Dr. Frank V. Aluisio, MD at [facility] on 04/09/2012. He right hip which has known arthritis. He feels like the right hip is getting a lot worse. It's becoming more activity limiting. Generally he does not have much pain at rest but has a lot of pain with any weight bearing or any activity. He feels like the left hip and left knee are doing extremely well. The pain from his right hip does travel down the leg at times.  He is ready to get the right hip fixed. They have been treated conservatively in the past for the above stated problem and despite conservative measures, they continue to have progressive pain and severe functional limitations and dysfunction. They have failed non-operative management including home exercise, medications. It is felt that they would benefit from undergoing total joint replacement. Risks and benefits of the procedure have been discussed with the patient and they elect to proceed with surgery. There are no active contraindications to surgery such as ongoing infection or rapidly progressive neurological disease.   Problem List Osteoarthritis, Hip (715.35)  Allergies No Known Drug Allergies   Family History Heart disease in male family member before age 65 Heart Disease. father and sister Hypertension. mother, father, sister and brother Heart disease in male family member before age 55 Congestive Heart Failure. father Cerebrovascular Accident. child Drug / Alcohol Addiction. brother Diabetes Mellitus. father Osteoarthritis. mother and father Cancer. mother and  father   Social History Exercise. Exercises daily; does running / walking and other Drug/Alcohol Rehab (Previously). no Living situation. live alone Illicit drug use. no Children. 4 Alcohol use. current drinker; drinks hard liquor; less than 5 per week Drug/Alcohol Rehab (Currently). no Current work status. retired Tobacco use. former smoker Number of flights of stairs before winded. 1 Marital status. widowed Tobacco / smoke exposure. yes outdoors only Pain Contract. no   Medication History AmLODIPine Besylate (10MG Tablet, Oral) Active. MetFORMIN HCl (500MG Tablet, Oral) Active. Glimepiride ( Oral) Specific dose unknown - Active. Diovan HCT (320-12.5MG Tablet, Oral) Active. Atorvastatin Calcium ( Oral) Specific dose unknown - Active. Carvedilol (12.5MG Tablet, Oral) Active.   Past Surgical History Total Hip Replacement. left Total Knee Replacement. left   Medical History High blood pressure Diabetes Mellitus, Type II Myocardial infarction Hypercholesterolemia   Review of Systems General:Not Present- Chills, Fever, Night Sweats, Fatigue, Weight Gain, Weight Loss and Memory Loss. Skin:Not Present- Hives, Itching, Rash, Eczema and Lesions. HEENT:Not Present- Tinnitus, Headache, Double Vision, Visual Loss, Hearing Loss and Dentures. Respiratory:Not Present- Shortness of breath with exertion, Shortness of breath at rest, Allergies, Coughing up blood and Chronic Cough. Cardiovascular:Not Present- Chest Pain, Racing/skipping heartbeats, Difficulty Breathing Lying Down, Murmur, Swelling and Palpitations. Gastrointestinal:Not Present- Bloody Stool, Heartburn, Abdominal Pain, Vomiting, Nausea, Constipation, Diarrhea, Difficulty Swallowing, Jaundice and Loss of appetitie. Male Genitourinary:Not Present- Urinary frequency, Blood in Urine, Weak urinary stream, Discharge, Flank Pain, Incontinence, Painful Urination, Urgency, Urinary Retention and  Urinating at Night. Musculoskeletal:Not Present- Muscle Weakness, Muscle Pain, Joint Swelling, Joint Pain, Back Pain, Morning Stiffness and Spasms. Neurological:Not Present- Tremor, Dizziness, Blackout spells, Paralysis, Difficulty with balance and Weakness. Psychiatric:Not   Present- Insomnia.   Vitals Weight: 230 lb Height: 71 in Body Surface Area: 2.29 m Body Mass Index: 32.08 kg/m Pulse: 76 (Regular) Resp.: 12 (Unlabored) BP: 138/62 (Sitting, Right Arm, Standard)    Physical Exam The physical exam findings are as follows:  Note: Patient is a 66 year old male with continued hip pain.   General Mental Status - Alert, cooperative and good historian. General Appearance- pleasant. Not in acute distress. Orientation- Oriented X3. Build & Nutrition- Well nourished and Well developed.   Head and Neck Head- normocephalic, atraumatic . Neck Global Assessment- supple. no bruit auscultated on the right and no bruit auscultated on the left.   Eye Vision- Wears corrective lenses. Pupil- Bilateral- Regular and Round. Motion- Bilateral- EOMI.   Chest and Lung Exam Auscultation: Breath sounds:- clear at anterior chest wall and - clear at posterior chest wall. Adventitious sounds:- No Adventitious sounds.   Cardiovascular Auscultation:Rhythm- Regular rate and rhythm. Heart Sounds- S1 WNL and S2 WNL. Murmurs & Other Heart Sounds:Auscultation of the heart reveals - No Murmurs.   Abdomen Inspection:Contour- Generalized mild distention. Palpation/Percussion:Tenderness- Abdomen is non-tender to palpation. Rigidity (guarding)- Abdomen is soft. Auscultation:Auscultation of the abdomen reveals - Bowel sounds normal.   Male Genitourinary Not done, not pertinent to present illness  Musculoskeletal On exam, he's alert and oriented, in no apparent distress. His left hip flexion is 120, rotation in 30, out 40, abduction 40 without  discomfort. Right hip flexion is to about 90. No internal rotation, about 5 external rotation, 5 abduction. Pulses, sensation and motor are intact, both lower extremities. His left knee looks fantastic. Range is 0-125. No swelling, tenderness or instability.  RADIOGRAPHS: AP of both knees, lateral of the left show the prosthesis on the left in excellent position with no periprosthetic abnormalities. There are some degenerative changes on the right but not end stage. Left total hip is doing well in regards to that. AP pelvis, AP and lateral show the prosthesis in excellent position with no abnormalities.  Assessment & Plan Osteoarthritis, Hip (715.35) Impression: Right Hip  Note: Plan is for a Right Total Hip Replacement by Dr. Aluisio.  Plan is to go home.  PCP - Dr. Spruill  Signed electronically by DREW L Josejuan Hoaglin, PA-C 

## 2012-04-09 ENCOUNTER — Inpatient Hospital Stay (HOSPITAL_COMMUNITY): Payer: Medicare PPO

## 2012-04-09 ENCOUNTER — Encounter (HOSPITAL_COMMUNITY)
Admission: RE | Disposition: A | Discharge status: Home Health | Payer: Self-pay | Source: Ambulatory Visit | Attending: Orthopedic Surgery

## 2012-04-09 ENCOUNTER — Encounter (HOSPITAL_COMMUNITY): Payer: Self-pay | Admitting: *Deleted

## 2012-04-09 ENCOUNTER — Encounter (HOSPITAL_COMMUNITY): Payer: Self-pay | Admitting: Anesthesiology

## 2012-04-09 ENCOUNTER — Inpatient Hospital Stay (HOSPITAL_COMMUNITY)
Admission: RE | Admit: 2012-04-09 | Discharge: 2012-04-11 | DRG: 470 | Disposition: A | Discharge status: Home Health | Payer: Medicare PPO | Source: Ambulatory Visit | Attending: Orthopedic Surgery | Admitting: Orthopedic Surgery

## 2012-04-09 ENCOUNTER — Inpatient Hospital Stay (HOSPITAL_COMMUNITY): Payer: Medicare PPO | Admitting: Anesthesiology

## 2012-04-09 DIAGNOSIS — E7800 Pure hypercholesterolemia, unspecified: Secondary | ICD-10-CM | POA: Diagnosis present

## 2012-04-09 DIAGNOSIS — M1610 Unilateral primary osteoarthritis, unspecified hip: Principal | ICD-10-CM | POA: Diagnosis present

## 2012-04-09 DIAGNOSIS — E871 Hypo-osmolality and hyponatremia: Secondary | ICD-10-CM

## 2012-04-09 DIAGNOSIS — I252 Old myocardial infarction: Secondary | ICD-10-CM

## 2012-04-09 DIAGNOSIS — D62 Acute posthemorrhagic anemia: Secondary | ICD-10-CM

## 2012-04-09 DIAGNOSIS — M169 Osteoarthritis of hip, unspecified: Secondary | ICD-10-CM | POA: Diagnosis present

## 2012-04-09 DIAGNOSIS — I10 Essential (primary) hypertension: Secondary | ICD-10-CM | POA: Diagnosis present

## 2012-04-09 DIAGNOSIS — E119 Type 2 diabetes mellitus without complications: Secondary | ICD-10-CM | POA: Diagnosis present

## 2012-04-09 HISTORY — PX: TOTAL HIP ARTHROPLASTY: SHX124

## 2012-04-09 LAB — CBC
HCT: 36.8 % — ABNORMAL LOW (ref 39.0–52.0)
Hemoglobin: 13.1 g/dL (ref 13.0–17.0)
Hemoglobin: 11.5 g/dL — ABNORMAL LOW (ref 13.0–17.0)
MCH: 31.4 pg (ref 26.0–34.0)
MCV: 88.3 fL (ref 78.0–100.0)
Platelets: 345 10*3/uL (ref 150–400)
RBC: 4.17 MIL/uL — ABNORMAL LOW (ref 4.22–5.81)
RBC: 3.66 MIL/uL — ABNORMAL LOW (ref 4.22–5.81)
WBC: 8.4 10*3/uL (ref 4.0–10.5)

## 2012-04-09 LAB — GLUCOSE, CAPILLARY
Glucose-Capillary: 100 mg/dL — ABNORMAL HIGH (ref 70–99)
Glucose-Capillary: 102 mg/dL — ABNORMAL HIGH (ref 70–99)
Glucose-Capillary: 169 mg/dL — ABNORMAL HIGH (ref 70–99)
Glucose-Capillary: 181 mg/dL — ABNORMAL HIGH (ref 70–99)

## 2012-04-09 LAB — CREATININE, SERUM
Creatinine, Ser: 0.87 mg/dL (ref 0.50–1.35)
GFR calc Af Amer: >90 mL/min (ref >90)

## 2012-04-09 LAB — TYPE AND SCREEN
ABO/RH(D): A POS
Antibody Screen: NEGATIVE

## 2012-04-09 SURGERY — ARTHROPLASTY, HIP, TOTAL,POSTERIOR APPROACH
Anesthesia: General | Site: Hip | Laterality: Right | Wound class: Clean

## 2012-04-09 MED ORDER — SODIUM CHLORIDE 0.9 % IJ SOLN
INTRAMUSCULAR | Status: DC | PRN
  Administered 2012-04-09: 50 mL

## 2012-04-09 MED ORDER — GLYCOPYRROLATE 0.2 MG/ML IJ SOLN
INTRAMUSCULAR | Status: DC | PRN
  Administered 2012-04-09: .8 mg via INTRAVENOUS

## 2012-04-09 MED ORDER — FLEET ENEMA 7-19 GM/118ML RE ENEM: 1.0000 | ENEMA | Freq: Once | RECTAL | Status: AC | PRN

## 2012-04-09 MED ORDER — OXYCODONE HCL 5 MG PO TABS
5.0000 mg | ORAL_TABLET | ORAL | Status: DC | PRN
  Administered 2012-04-09 – 2012-04-11 (×10): 10 mg via ORAL
  Filled 2012-04-09 (×10): qty 2

## 2012-04-09 MED ORDER — CEFAZOLIN SODIUM-DEXTROSE 2-3 GM-% IV SOLR
2.0000 g | Freq: Four times a day (QID) | INTRAVENOUS | Status: AC
  Administered 2012-04-09 – 2012-04-10 (×2): 2 g via INTRAVENOUS
  Filled 2012-04-09 (×2): qty 50

## 2012-04-09 MED ORDER — LIDOCAINE HCL (CARDIAC) 20 MG/ML IV SOLN
INTRAVENOUS | Status: DC | PRN
  Administered 2012-04-09: 80 mg via INTRAVENOUS

## 2012-04-09 MED ORDER — ROCURONIUM BROMIDE 100 MG/10ML IV SOLN
INTRAVENOUS | Status: DC | PRN
  Administered 2012-04-09: 50 mg via INTRAVENOUS

## 2012-04-09 MED ORDER — METHOCARBAMOL 100 MG/ML IJ SOLN
500.0000 mg | Freq: Four times a day (QID) | INTRAVENOUS | Status: DC | PRN
  Administered 2012-04-09: 500 mg via INTRAVENOUS
  Filled 2012-04-09: qty 5

## 2012-04-09 MED ORDER — 0.9 % SODIUM CHLORIDE (POUR BTL) OPTIME
TOPICAL | Status: DC | PRN
  Administered 2012-04-09: 1000 mL

## 2012-04-09 MED ORDER — IRBESARTAN 300 MG PO TABS
300.0000 mg | ORAL_TABLET | Freq: Every day | ORAL | Status: DC
  Administered 2012-04-10 – 2012-04-11 (×2): 300 mg via ORAL
  Filled 2012-04-09 (×2): qty 1

## 2012-04-09 MED ORDER — CHLORHEXIDINE GLUCONATE 4 % EX LIQD: 60.0000 mL | Freq: Once | CUTANEOUS | Status: DC

## 2012-04-09 MED ORDER — HYDROMORPHONE HCL PF 1 MG/ML IJ SOLN
INTRAMUSCULAR | Status: DC | PRN
  Administered 2012-04-09: 1 mg via INTRAVENOUS

## 2012-04-09 MED ORDER — BUPIVACAINE LIPOSOME 1.3 % IJ SUSP
INTRAMUSCULAR | Status: DC | PRN
  Administered 2012-04-09: 20 mL

## 2012-04-09 MED ORDER — RIVAROXABAN 10 MG PO TABS
10.0000 mg | ORAL_TABLET | Freq: Every day | ORAL | Status: DC
  Administered 2012-04-10 – 2012-04-11 (×2): 10 mg via ORAL
  Filled 2012-04-09 (×4): qty 1

## 2012-04-09 MED ORDER — HYDROCHLOROTHIAZIDE 12.5 MG PO CAPS
12.5000 mg | ORAL_CAPSULE | Freq: Every day | ORAL | Status: DC
  Administered 2012-04-10 – 2012-04-11 (×2): 12.5 mg via ORAL
  Filled 2012-04-09 (×2): qty 1

## 2012-04-09 MED ORDER — TRAMADOL HCL 50 MG PO TABS: 50.0000 mg | ORAL_TABLET | Freq: Four times a day (QID) | ORAL | Status: DC | PRN

## 2012-04-09 MED ORDER — INSULIN ASPART 100 UNIT/ML ~~LOC~~ SOLN
0.0000 [IU] | Freq: Three times a day (TID) | SUBCUTANEOUS | Status: DC
  Administered 2012-04-09: 3 [IU] via SUBCUTANEOUS

## 2012-04-09 MED ORDER — CARVEDILOL 25 MG PO TABS
25.0000 mg | ORAL_TABLET | Freq: Two times a day (BID) | ORAL | Status: DC
  Administered 2012-04-10 – 2012-04-11 (×3): 25 mg via ORAL
  Filled 2012-04-09 (×6): qty 1

## 2012-04-09 MED ORDER — HYDROMORPHONE HCL PF 1 MG/ML IJ SOLN
0.2500 mg | INTRAMUSCULAR | Status: DC | PRN
  Administered 2012-04-09 (×3): 0.5 mg via INTRAVENOUS

## 2012-04-09 MED ORDER — ACETAMINOPHEN 325 MG PO TABS: 650.0000 mg | ORAL_TABLET | Freq: Four times a day (QID) | ORAL | Status: DC | PRN

## 2012-04-09 MED ORDER — NEOSTIGMINE METHYLSULFATE 1 MG/ML IJ SOLN
INTRAMUSCULAR | Status: DC | PRN
  Administered 2012-04-09: 5 mg via INTRAVENOUS

## 2012-04-09 MED ORDER — GLIMEPIRIDE 4 MG PO TABS
4.0000 mg | ORAL_TABLET | Freq: Every day | ORAL | Status: DC
  Administered 2012-04-10 – 2012-04-11 (×2): 4 mg via ORAL
  Filled 2012-04-09 (×5): qty 1

## 2012-04-09 MED ORDER — ACETAMINOPHEN 10 MG/ML IV SOLN
1000.0000 mg | Freq: Once | INTRAVENOUS | Status: AC
  Administered 2012-04-09: 1000 mg via INTRAVENOUS

## 2012-04-09 MED ORDER — METHOCARBAMOL 500 MG PO TABS
500.0000 mg | ORAL_TABLET | Freq: Four times a day (QID) | ORAL | Status: DC | PRN
  Administered 2012-04-10 – 2012-04-11 (×2): 500 mg via ORAL
  Filled 2012-04-09 (×2): qty 1

## 2012-04-09 MED ORDER — ONDANSETRON HCL 4 MG/2ML IJ SOLN: 4.0000 mg | Freq: Four times a day (QID) | INTRAMUSCULAR | Status: DC | PRN

## 2012-04-09 MED ORDER — ONDANSETRON HCL 4 MG PO TABS: 4.0000 mg | ORAL_TABLET | Freq: Four times a day (QID) | ORAL | Status: DC | PRN

## 2012-04-09 MED ORDER — METFORMIN HCL 500 MG PO TABS
500.0000 mg | ORAL_TABLET | Freq: Three times a day (TID) | ORAL | Status: DC
  Filled 2012-04-09 (×5): qty 1

## 2012-04-09 MED ORDER — MORPHINE SULFATE 2 MG/ML IJ SOLN
1.0000 mg | INTRAMUSCULAR | Status: DC | PRN
  Administered 2012-04-09: 1 mg via INTRAVENOUS
  Administered 2012-04-09 – 2012-04-10 (×2): 2 mg via INTRAVENOUS
  Filled 2012-04-09 (×3): qty 1

## 2012-04-09 MED ORDER — ACETAMINOPHEN 10 MG/ML IV SOLN
1000.0000 mg | Freq: Four times a day (QID) | INTRAVENOUS | Status: AC
  Administered 2012-04-09 – 2012-04-10 (×4): 1000 mg via INTRAVENOUS
  Filled 2012-04-09 (×7): qty 100

## 2012-04-09 MED ORDER — POLYETHYLENE GLYCOL 3350 17 G PO PACK
17.0000 g | PACK | Freq: Every day | ORAL | Status: DC | PRN
  Administered 2012-04-10: 17 g via ORAL

## 2012-04-09 MED ORDER — BUPIVACAINE LIPOSOME 1.3 % IJ SUSP
20.0000 mL | Freq: Once | INTRAMUSCULAR | Status: DC
  Filled 2012-04-09: qty 20

## 2012-04-09 MED ORDER — SODIUM CHLORIDE 0.9 % IV SOLN
INTRAVENOUS | Status: DC
  Administered 2012-04-09: 18:00:00 via INTRAVENOUS

## 2012-04-09 MED ORDER — STERILE WATER FOR IRRIGATION IR SOLN
Status: DC | PRN
  Administered 2012-04-09: 3000 mL

## 2012-04-09 MED ORDER — DOCUSATE SODIUM 100 MG PO CAPS
100.0000 mg | ORAL_CAPSULE | Freq: Two times a day (BID) | ORAL | Status: DC
  Administered 2012-04-09 – 2012-04-11 (×4): 100 mg via ORAL

## 2012-04-09 MED ORDER — MENTHOL 3 MG MT LOZG
1.0000 | LOZENGE | OROMUCOSAL | Status: DC | PRN
  Filled 2012-04-09: qty 9

## 2012-04-09 MED ORDER — PROPOFOL 10 MG/ML IV BOLUS
INTRAVENOUS | Status: DC | PRN
  Administered 2012-04-09: 180 mg via INTRAVENOUS

## 2012-04-09 MED ORDER — METOCLOPRAMIDE HCL 10 MG PO TABS: 5.0000 mg | ORAL_TABLET | Freq: Three times a day (TID) | ORAL | Status: DC | PRN

## 2012-04-09 MED ORDER — ATORVASTATIN CALCIUM 20 MG PO TABS
20.0000 mg | ORAL_TABLET | Freq: Every day | ORAL | Status: DC
  Administered 2012-04-10 – 2012-04-11 (×2): 20 mg via ORAL
  Filled 2012-04-09 (×2): qty 1

## 2012-04-09 MED ORDER — SODIUM CHLORIDE 0.9 % IV SOLN: INTRAVENOUS | Status: DC

## 2012-04-09 MED ORDER — LACTATED RINGERS IV SOLN
INTRAVENOUS | Status: DC
  Administered 2012-04-09: 1000 mL via INTRAVENOUS

## 2012-04-09 MED ORDER — ONDANSETRON HCL 4 MG/2ML IJ SOLN
INTRAMUSCULAR | Status: DC | PRN
  Administered 2012-04-09: 4 mg via INTRAVENOUS

## 2012-04-09 MED ORDER — PHENOL 1.4 % MT LIQD
1.0000 | OROMUCOSAL | Status: DC | PRN
  Filled 2012-04-09: qty 177

## 2012-04-09 MED ORDER — DEXAMETHASONE SODIUM PHOSPHATE 10 MG/ML IJ SOLN: 10.0000 mg | Freq: Once | INTRAMUSCULAR | Status: AC

## 2012-04-09 MED ORDER — ACETAMINOPHEN 650 MG RE SUPP: 650.0000 mg | Freq: Four times a day (QID) | RECTAL | Status: DC | PRN

## 2012-04-09 MED ORDER — CEFAZOLIN SODIUM-DEXTROSE 2-3 GM-% IV SOLR
2.0000 g | INTRAVENOUS | Status: AC
  Administered 2012-04-09: 2 g via INTRAVENOUS

## 2012-04-09 MED ORDER — PROMETHAZINE HCL 25 MG/ML IJ SOLN: 6.2500 mg | INTRAMUSCULAR | Status: DC | PRN

## 2012-04-09 MED ORDER — BISACODYL 10 MG RE SUPP: 10.0000 mg | Freq: Every day | RECTAL | Status: DC | PRN

## 2012-04-09 MED ORDER — METOCLOPRAMIDE HCL 5 MG/ML IJ SOLN: 5.0000 mg | Freq: Three times a day (TID) | INTRAMUSCULAR | Status: DC | PRN

## 2012-04-09 MED ORDER — INSULIN ASPART 100 UNIT/ML ~~LOC~~ SOLN: 0.0000 [IU] | Freq: Three times a day (TID) | SUBCUTANEOUS | Status: DC

## 2012-04-09 MED ORDER — MIDAZOLAM HCL 5 MG/5ML IJ SOLN
INTRAMUSCULAR | Status: DC | PRN
  Administered 2012-04-09: 2 mg via INTRAVENOUS

## 2012-04-09 MED ORDER — FENTANYL CITRATE 0.05 MG/ML IJ SOLN
INTRAMUSCULAR | Status: DC | PRN
  Administered 2012-04-09 (×5): 50 ug via INTRAVENOUS

## 2012-04-09 MED ORDER — DIPHENHYDRAMINE HCL 12.5 MG/5ML PO ELIX: 12.5000 mg | ORAL_SOLUTION | ORAL | Status: DC | PRN

## 2012-04-09 MED ORDER — VALSARTAN-HYDROCHLOROTHIAZIDE 320-12.5 MG PO TABS: 1.0000 | ORAL_TABLET | Freq: Every morning | ORAL | Status: DC

## 2012-04-09 MED ORDER — DEXAMETHASONE 4 MG PO TABS
10.0000 mg | ORAL_TABLET | Freq: Once | ORAL | Status: AC
  Administered 2012-04-10: 10 mg via ORAL
  Filled 2012-04-09: qty 2.5

## 2012-04-09 MED ORDER — AMLODIPINE BESYLATE 10 MG PO TABS
10.0000 mg | ORAL_TABLET | Freq: Every day | ORAL | Status: DC
  Administered 2012-04-10 – 2012-04-11 (×2): 10 mg via ORAL
  Filled 2012-04-09 (×2): qty 1

## 2012-04-09 SURGICAL SUPPLY — 50 items
BAG ZIPLOCK 12X15 (MISCELLANEOUS) ×2 IMPLANT
BIT DRILL 2.8X128 (BIT) ×2 IMPLANT
BLADE EXTENDED COATED 6.5IN (ELECTRODE) ×2 IMPLANT
BLADE SAW SAG 73X25 THK (BLADE) ×1
BLADE SAW SGTL 73X25 THK (BLADE) ×1 IMPLANT
CLOTH BEACON ORANGE TIMEOUT ST (SAFETY) ×2 IMPLANT
DECANTER SPIKE VIAL GLASS SM (MISCELLANEOUS) IMPLANT
DRAPE INCISE IOBAN 66X45 STRL (DRAPES) ×2 IMPLANT
DRAPE ORTHO SPLIT 77X108 STRL (DRAPES) ×2
DRAPE POUCH INSTRU U-SHP 10X18 (DRAPES) ×2 IMPLANT
DRAPE SURG ORHT 6 SPLT 77X108 (DRAPES) ×2 IMPLANT
DRAPE U-SHAPE 47X51 STRL (DRAPES) ×2 IMPLANT
DRSG ADAPTIC 3X8 NADH LF (GAUZE/BANDAGES/DRESSINGS) IMPLANT
DRSG MEPILEX BORDER 4X4 (GAUZE/BANDAGES/DRESSINGS) IMPLANT
DRSG MEPILEX BORDER 4X8 (GAUZE/BANDAGES/DRESSINGS) IMPLANT
DURAPREP 26ML APPLICATOR (WOUND CARE) ×2 IMPLANT
ELECT REM PT RETURN 9FT ADLT (ELECTROSURGICAL) ×2
ELECTRODE REM PT RTRN 9FT ADLT (ELECTROSURGICAL) ×1 IMPLANT
EVACUATOR 1/8 PVC DRAIN (DRAIN) ×2 IMPLANT
FACESHIELD LNG OPTICON STERILE (SAFETY) ×8 IMPLANT
GAUZE SPONGE 4X4 16PLY XRAY LF (GAUZE/BANDAGES/DRESSINGS) ×2 IMPLANT
GLOVE BIO SURGEON STRL SZ8 (GLOVE) ×2 IMPLANT
GLOVE BIOGEL PI IND STRL 8 (GLOVE) ×2 IMPLANT
GLOVE BIOGEL PI INDICATOR 8 (GLOVE) ×2
GLOVE ECLIPSE 8.0 STRL XLNG CF (GLOVE) ×2 IMPLANT
GLOVE SURG SS PI 6.5 STRL IVOR (GLOVE) ×4 IMPLANT
GOWN STRL NON-REIN LRG LVL3 (GOWN DISPOSABLE) ×6 IMPLANT
GOWN STRL REIN XL XLG (GOWN DISPOSABLE) ×2 IMPLANT
IMMOBILIZER KNEE 20 (SOFTGOODS) ×2
IMMOBILIZER KNEE 20 THIGH 36 (SOFTGOODS) ×1 IMPLANT
KIT BASIN OR (CUSTOM PROCEDURE TRAY) ×2 IMPLANT
MANIFOLD NEPTUNE II (INSTRUMENTS) ×2 IMPLANT
NDL SAFETY ECLIPSE 18X1.5 (NEEDLE) ×1 IMPLANT
NEEDLE HYPO 18GX1.5 SHARP (NEEDLE) ×1
NS IRRIG 1000ML POUR BTL (IV SOLUTION) ×2 IMPLANT
PACK TOTAL JOINT (CUSTOM PROCEDURE TRAY) ×2 IMPLANT
PASSER SUT SWANSON 36MM LOOP (INSTRUMENTS) ×2 IMPLANT
POSITIONER SURGICAL ARM (MISCELLANEOUS) ×2 IMPLANT
SPONGE GAUZE 4X4 12PLY (GAUZE/BANDAGES/DRESSINGS) ×2 IMPLANT
STRIP CLOSURE SKIN 1/2X4 (GAUZE/BANDAGES/DRESSINGS) ×4 IMPLANT
SUT ETHIBOND NAB CT1 #1 30IN (SUTURE) ×4 IMPLANT
SUT MNCRL AB 4-0 PS2 18 (SUTURE) ×2 IMPLANT
SUT VIC AB 2-0 CT1 27 (SUTURE) ×3
SUT VIC AB 2-0 CT1 TAPERPNT 27 (SUTURE) ×3 IMPLANT
SUT VLOC 180 0 24IN GS25 (SUTURE) ×4 IMPLANT
SYR 50ML LL SCALE MARK (SYRINGE) ×2 IMPLANT
TOWEL OR 17X26 10 PK STRL BLUE (TOWEL DISPOSABLE) ×4 IMPLANT
TOWEL OR NON WOVEN STRL DISP B (DISPOSABLE) ×2 IMPLANT
TRAY FOLEY CATH 14FRSI W/METER (CATHETERS) ×2 IMPLANT
WATER STERILE IRR 1500ML POUR (IV SOLUTION) ×4 IMPLANT

## 2012-04-09 NOTE — Plan of Care (Signed)
Problem: Consults Goal: Diagnosis- Total Joint Replacement Outcome: Completed/Met Date Met:  04/09/12 Primary Total Hip

## 2012-04-09 NOTE — Op Note (Signed)
Pre-operative diagnosis- Osteoarthritis Right hip  Post-operative diagnosis- Osteoarthritis  Right hip  Procedure-  RightTotal Hip Arthroplasty  Surgeon- Aaron Rankin. Gunnard Dorrance, MD  Assistant- Dimitri Ped, PA-C   Anesthesia  General  EBL- 300   Drain Hemovac   Complication- None  Condition-PACU - hemodynamically stable.   Brief Clinical Note- [name] is a 66 y.o. male with end stage arthritis of his right hip with progressively worsening pain and dysfunction. Pain occurs with activity and rest including pain at night. He has tried analgesics, protected weight bearing and rest without benefit. Pain is too severe to attempt physical therapy. Radiographs demonstrate bone on bone arthritis with subchondral cyst formation. He presents now for right THA.  Procedure in detail-   The patient is brought into the operating room and placed on the operating table. After successful administration of General  anesthesia, the patient is placed in the  Left lateral decubitus position with the  Right side up and held in place with the hip positioner. The lower extremity is isolated from the perineum with plastic drapes and time-out is performed by the surgical team. The lower extremity is then prepped and draped in the usual sterile fashion. A short posterolateral incision is made with a ten blade through the subcutaneous tissue to the level of the fascia lata which is incised in line with the skin incision. The sciatic nerve is palpated and protected and the short external rotators and capsule are isolated from the femur. The hip is then dislocated and the center of the femoral head is marked. A trial prosthesis is placed such that the trial head corresponds to the center of the patients' native femoral head. The resection level is marked on the femoral neck and the resection is made with an oscillating saw. The femoral head is removed and femoral retractors placed to gain access to the femoral  canal.      The canal finder is passed into the femoral canal and the canal is thoroughly irrigated with sterile saline to remove the fatty contents. Axial reaming is performed to 15.5  mm, proximal reaming to 33F  and the sleeve machined to a large. A 33F large trial sleeve is placed into the proximal femur.      The femur is then retracted anteriorly to gain acetabular exposure. Acetabular retractors are placed and the labrum and osteophytes are removed, Acetabular reaming is performed to 57  mm and a 58  mm Pinnacle acetabular shell is placed in anatomic position with excellent purchase. Additional dome screws were not needed. An apex hole eliminator is placed and the permanent 36 mm neutral + 4 Marathon liner is placed into the acetabular shell.      The trial femur is then placed into the femoral canal. The size is 20 x 15  stem with a 36 + 8  neck and a 36 + 3 head with the neck version matching  the patients' native anteversion. The hip is reduced with excellent stability with full extension and full external rotation, 70 degrees flexion with 40 degrees adduction and 90 degrees internal rotation and 90 degrees of flexion with 70 degrees of internal rotation. The operative leg is placed on top of the non-operative leg and the leg lengths are found to be equal. The trials are then removed and the permanent implant of the same size is impacted into the femoral canal. The  Ceramic femoral head of the same size as the trial is placed and the hip  is reduced with the same stability parameters. The operative leg is again placed on top of the non-operative leg and the leg lengths are found to be equal.      The wound is then copiously irrigated with saline solution and the capsule and short external rotators are re-attached to the femur through drill holes with Ethibond suture. The fascia lata is closed over a hemovac drain with #1 vicryl suture and the fascia lata, gluteal muscles and subcutaneous tissues are  injected with Exparel 20ml diluted with saline 50ml. The subcutaneous tissues are closed with #1 and2-0 vicryl and the subcuticular layer closed with running 4-0 Monocryl. The drain is hooked to suction, incision cleaned and dried, and steri-srips and a bulky sterile dressing applied. The limb is placed into a knee immobilizer and the patient is awakened and transported to recovery in stable condition.      Please note that a surgical assistant was a medical necessity for this procedure in order to perform it in a safe and expeditious manner. The assistant was necessary to provide retraction to the vital neurovascular structures and to retract and position the limb to allow for anatomic placement of the prosthetic components.  Aaron Rankin Melaney Tellefsen, MD    04/09/2012, 4:19 PM

## 2012-04-09 NOTE — H&P (View-Only) (Signed)
[name]  DOB: Mar 04, 1946 Widowed / Language: English / Race: Black or African American Male  Date of Admission:  04/09/2012  Chief Complaint:  Right Hip Pain  History of Present Illness The patient is a 66 year old male who comes in for a preoperative History and Physical. The patient is scheduled for a right total hip arthroplasty to be performed by Dr. Gus Rankin. Aluisio, MD at [facility] on 04/09/2012. He right hip which has known arthritis. He feels like the right hip is getting a lot worse. It's becoming more activity limiting. Generally he does not have much pain at rest but has a lot of pain with any weight bearing or any activity. He feels like the left hip and left knee are doing extremely well. The pain from his right hip does travel down the leg at times.  He is ready to get the right hip fixed. They have been treated conservatively in the past for the above stated problem and despite conservative measures, they continue to have progressive pain and severe functional limitations and dysfunction. They have failed non-operative management including home exercise, medications. It is felt that they would benefit from undergoing total joint replacement. Risks and benefits of the procedure have been discussed with the patient and they elect to proceed with surgery. There are no active contraindications to surgery such as ongoing infection or rapidly progressive neurological disease.   Problem List Osteoarthritis, Hip (715.35)  Allergies No Known Drug Allergies   Family History Heart disease in male family member before age 71 Heart Disease. father and sister Hypertension. mother, father, sister and brother Heart disease in male family member before age 101 Congestive Heart Failure. father Cerebrovascular Accident. child Drug / Alcohol Addiction. brother Diabetes Mellitus. father Osteoarthritis. mother and father Cancer. mother and  father   Social History Exercise. Exercises daily; does running / walking and other Drug/Alcohol Rehab (Previously). no Living situation. live alone Illicit drug use. no Children. 4 Alcohol use. current drinker; drinks hard liquor; less than 5 per week Drug/Alcohol Rehab (Currently). no Current work status. retired Tobacco use. former smoker Number of flights of stairs before winded. 1 Marital status. widowed Tobacco / smoke exposure. yes outdoors only Pain Contract. no   Medication History AmLODIPine Besylate (10MG  Tablet, Oral) Active. MetFORMIN HCl (500MG  Tablet, Oral) Active. Glimepiride ( Oral) Specific dose unknown - Active. Diovan HCT (320-12.5MG  Tablet, Oral) Active. Atorvastatin Calcium ( Oral) Specific dose unknown - Active. Carvedilol (12.5MG  Tablet, Oral) Active.   Past Surgical History Total Hip Replacement. left Total Knee Replacement. left   Medical History High blood pressure Diabetes Mellitus, Type II Myocardial infarction Hypercholesterolemia   Review of Systems General:Not Present- Chills, Fever, Night Sweats, Fatigue, Weight Gain, Weight Loss and Memory Loss. Skin:Not Present- Hives, Itching, Rash, Eczema and Lesions. HEENT:Not Present- Tinnitus, Headache, Double Vision, Visual Loss, Hearing Loss and Dentures. Respiratory:Not Present- Shortness of breath with exertion, Shortness of breath at rest, Allergies, Coughing up blood and Chronic Cough. Cardiovascular:Not Present- Chest Pain, Racing/skipping heartbeats, Difficulty Breathing Lying Down, Murmur, Swelling and Palpitations. Gastrointestinal:Not Present- Bloody Stool, Heartburn, Abdominal Pain, Vomiting, Nausea, Constipation, Diarrhea, Difficulty Swallowing, Jaundice and Loss of appetitie. Male Genitourinary:Not Present- Urinary frequency, Blood in Urine, Weak urinary stream, Discharge, Flank Pain, Incontinence, Painful Urination, Urgency, Urinary Retention and  Urinating at Night. Musculoskeletal:Not Present- Muscle Weakness, Muscle Pain, Joint Swelling, Joint Pain, Back Pain, Morning Stiffness and Spasms. Neurological:Not Present- Tremor, Dizziness, Blackout spells, Paralysis, Difficulty with balance and Weakness. Psychiatric:Not  Present- Insomnia.   Vitals Weight: 230 lb Height: 71 in Body Surface Area: 2.29 m Body Mass Index: 32.08 kg/m Pulse: 76 (Regular) Resp.: 12 (Unlabored) BP: 138/62 (Sitting, Right Arm, Standard)    Physical Exam The physical exam findings are as follows:  Note: Patient is a 66 year old male with continued hip pain.   General Mental Status - Alert, cooperative and good historian. General Appearance- pleasant. Not in acute distress. Orientation- Oriented X3. Build & Nutrition- Well nourished and Well developed.   Head and Neck Head- normocephalic, atraumatic . Neck Global Assessment- supple. no bruit auscultated on the right and no bruit auscultated on the left.   Eye Vision- Wears corrective lenses. Pupil- Bilateral- Regular and Round. Motion- Bilateral- EOMI.   Chest and Lung Exam Auscultation: Breath sounds:- clear at anterior chest wall and - clear at posterior chest wall. Adventitious sounds:- No Adventitious sounds.   Cardiovascular Auscultation:Rhythm- Regular rate and rhythm. Heart Sounds- S1 WNL and S2 WNL. Murmurs & Other Heart Sounds:Auscultation of the heart reveals - No Murmurs.   Abdomen Inspection:Contour- Generalized mild distention. Palpation/Percussion:Tenderness- Abdomen is non-tender to palpation. Rigidity (guarding)- Abdomen is soft. Auscultation:Auscultation of the abdomen reveals - Bowel sounds normal.   Male Genitourinary Not done, not pertinent to present illness  Musculoskeletal On exam, he's alert and oriented, in no apparent distress. His left hip flexion is 120, rotation in 30, out 40, abduction 40 without  discomfort. Right hip flexion is to about 90. No internal rotation, about 5 external rotation, 5 abduction. Pulses, sensation and motor are intact, both lower extremities. His left knee looks fantastic. Range is 0-125. No swelling, tenderness or instability.  RADIOGRAPHS: AP of both knees, lateral of the left show the prosthesis on the left in excellent position with no periprosthetic abnormalities. There are some degenerative changes on the right but not end stage. Left total hip is doing well in regards to that. AP pelvis, AP and lateral show the prosthesis in excellent position with no abnormalities.  Assessment & Plan Osteoarthritis, Hip (715.35) Impression: Right Hip  Note: Plan is for a Right Total Hip Replacement by Dr. Lequita Halt.  Plan is to go home.  PCP - Dr. Shana Chute  Signed electronically by Roberts Gaudy, PA-C

## 2012-04-09 NOTE — Anesthesia Preprocedure Evaluation (Addendum)
Anesthesia Evaluation  Patient identified by MRN, date of birth, ID band Patient awake    Reviewed: Allergy & Precautions, H&P , NPO status , Patient's Chart, lab work & pertinent test results  History of Anesthesia Complications (+) AWARENESS UNDER ANESTHESIA  Airway Mallampati: II TM Distance: >3 FB Neck ROM: Full    Dental No notable dental hx.    Pulmonary neg pulmonary ROS,  breath sounds clear to auscultation  Pulmonary exam normal       Cardiovascular hypertension, Pt. on medications + Past MI Rhythm:Regular Rate:Normal     Neuro/Psych negative neurological ROS  negative psych ROS   GI/Hepatic negative GI ROS, Neg liver ROS,   Endo/Other  diabetes, Oral Hypoglycemic Agents  Renal/GU negative Renal ROS  negative genitourinary   Musculoskeletal negative musculoskeletal ROS (+)   Abdominal   Peds negative pediatric ROS (+)  Hematology negative hematology ROS (+)   Anesthesia Other Findings   Reproductive/Obstetrics negative OB ROS                          Anesthesia Physical Anesthesia Plan  ASA: III  Anesthesia Plan: General   Post-op Pain Management:    Induction: Intravenous  Airway Management Planned: Oral ETT  Additional Equipment:   Intra-op Plan:   Post-operative Plan: Extubation in OR  Informed Consent: I have reviewed the patients History and Physical, chart, labs and discussed the procedure including the risks, benefits and alternatives for the proposed anesthesia with the patient or authorized representative who has indicated his/her understanding and acceptance.   Dental advisory given  Plan Discussed with: CRNA and Surgeon  Anesthesia Plan Comments:         Anesthesia Quick Evaluation

## 2012-04-09 NOTE — Progress Notes (Signed)
Received report from Rayfield Citizen stated she will complete admission hx. Iokepa Geffre RN

## 2012-04-09 NOTE — Anesthesia Postprocedure Evaluation (Signed)
  Anesthesia Post-op Note  Patient: [name]  Procedure(s) Performed: Procedure(s) (LRB): TOTAL HIP ARTHROPLASTY (Right)  Patient Location: PACU  Anesthesia Type: General  Level of Consciousness: awake and alert   Airway and Oxygen Therapy: Patient Spontanous Breathing  Post-op Pain: mild  Post-op Assessment: Post-op Vital signs reviewed, Patient's Cardiovascular Status Stable, Respiratory Function Stable, Patent Airway and No signs of Nausea or vomiting  Last Vitals: There were no vitals filed for this visit.  Post-op Vital Signs: stable   Complications: No apparent anesthesia complications

## 2012-04-09 NOTE — Interval H&P Note (Signed)
History and Physical Interval Note:  04/09/2012 2:56 PM  [name]  has presented today for surgery, with the diagnosis of osteoarthritis of the Right Hip  The various methods of treatment have been discussed with the patient and family. After consideration of risks, benefits and other options for treatment, the patient has consented to  Procedure(s) (LRB) with comments: TOTAL HIP ARTHROPLASTY (Right) as a surgical intervention .  The patient's history has been reviewed, patient examined, no change in status, stable for surgery.  I have reviewed the patient's chart and labs.  Questions were answered to the patient's satisfaction.     Loanne Drilling

## 2012-04-09 NOTE — Progress Notes (Signed)
Dr. Okey Dupre was informed that patient ate a slice of cheese at 0830

## 2012-04-09 NOTE — Transfer of Care (Signed)
Immediate Anesthesia Transfer of Care Note  Patient: [name]  Procedure(s) Performed: Procedure(s) (LRB) with comments: TOTAL HIP ARTHROPLASTY (Right)  Patient Location: PACU  Anesthesia Type:General  Level of Consciousness: awake, alert , oriented and patient cooperative  Airway & Oxygen Therapy: Patient Spontanous Breathing and Patient connected to face mask oxygen  Post-op Assessment: Report given to PACU RN, Post -op Vital signs reviewed and stable and Patient moving all extremities  Post vital signs: Reviewed and stable  Complications: No apparent anesthesia complications

## 2012-04-09 NOTE — Preoperative (Signed)
Beta Blockers   Reason not to administer Beta Blockers:coreg taken at 0730 04-09-12

## 2012-04-10 ENCOUNTER — Encounter (HOSPITAL_COMMUNITY): Payer: Self-pay | Admitting: Orthopedic Surgery

## 2012-04-10 LAB — BASIC METABOLIC PANEL
CO2: 23 mEq/L (ref 19–32)
Calcium: 8.4 mg/dL (ref 8.4–10.5)
Chloride: 101 mEq/L (ref 96–112)
Glucose, Bld: 182 mg/dL — ABNORMAL HIGH (ref 70–99)
Potassium: 3.9 mEq/L (ref 3.5–5.1)
Sodium: 134 mEq/L — ABNORMAL LOW (ref 135–145)

## 2012-04-10 LAB — GLUCOSE, CAPILLARY: Glucose-Capillary: 173 mg/dL — ABNORMAL HIGH (ref 70–99)

## 2012-04-10 LAB — CBC
Hemoglobin: 10.7 g/dL — ABNORMAL LOW (ref 13.0–17.0)
MCH: 30.8 pg (ref 26.0–34.0)
Platelets: 309 10*3/uL (ref 150–400)
RBC: 3.47 MIL/uL — ABNORMAL LOW (ref 4.22–5.81)
WBC: 6.9 10*3/uL (ref 4.0–10.5)

## 2012-04-10 MED ORDER — INSULIN ASPART 100 UNIT/ML ~~LOC~~ SOLN
0.0000 [IU] | Freq: Three times a day (TID) | SUBCUTANEOUS | Status: DC
  Administered 2012-04-10: 2 [IU] via SUBCUTANEOUS
  Administered 2012-04-10 (×2): 3 [IU] via SUBCUTANEOUS
  Administered 2012-04-11: 08:00:00 via SUBCUTANEOUS

## 2012-04-10 NOTE — Care Management Note (Unsigned)
    Page 1 of 2   04/10/2012     4:31:15 PM   CARE MANAGEMENT NOTE 04/10/2012  Patient:  [name], [name]   Account Number:  [id number]  Date Initiated:  04/10/2012  Documentation initiated by:  Colleen Can  Subjective/Objective Assessment:   DX OSTEOARTHRITIS RIGHT HIP; TOTAL HIP REPLACEMNT     Action/Plan:   CM SPOKE WITH PATIENT. pLANS ARE FOR PATIENT TO RETURN TO HIS HOME IN [place] WHERE HE WILL HAVE CAREGIVERS. ALREADY HAS DME WHICH INCLUDES RW. WANTS HH AGENCY THAT IS IN NETWORK.   Anticipated DC Date:  04/13/2011   Anticipated DC Plan:  HOME W HOME HEALTH SERVICES  In-house referral  NA      DC Planning Services  CM consult      [facility] Choice  HOME HEALTH   Choice offered to / List presented to:  C-1 Patient   DME arranged  NA      DME agency  NA        Status of service:  In process, will continue to follow Medicare Important Message given?   (If response is "NO", the following Medicare IM given date fields will be blank) Date Medicare IM given:   Date Additional Medicare IM given:    Discharge Disposition:    Per UR Regulation:    If discussed at Long Length of Stay Meetings, dates discussed:    Comments:  04/10/2012 [name] bsn rn ccm [phone] 4:00 PT HAD HH SERVICES IN 2010 BUT CAN NOT REMEMBER NAME OF AGENCY. PER RECORD RESEARCH PT WAS SET UP WITH [facility]. TCT CALL TO [facility]-PER ANSWERING SERVICE SHE WILL HAVE ON CALL PERSON CALL BACK. cm will await call back.

## 2012-04-10 NOTE — Progress Notes (Signed)
   Subjective: 1 Day Post-Op Procedure(s) (LRB): TOTAL HIP ARTHROPLASTY (Right) Patient reports pain as mild.   Patient seen in rounds with Dr. Lequita Halt. Patient is well, and has had no acute complaints or problems We will start therapy today.  Plan is to go Home after hospital stay.  Objective: Vital signs in last 24 hours: Temp:  [98 F (36.7 C)-98.7 F (37.1 C)] 98.3 F (36.8 C) (12/31 0533) Pulse Rate:  [61-88] 78  (12/31 0533) Resp:  [12-18] 16  (12/31 0533) BP: (121-174)/(67-91) 172/80 mmHg (12/31 0533) SpO2:  [94 %-100 %] 95 % (12/31 0533) Weight:  [110.224 kg (243 lb)] 110.224 kg (243 lb) (12/30 1815)  Intake/Output from previous day:  Intake/Output Summary (Last 24 hours) at 04/10/12 0939 Last data filed at 04/10/12 4782  Gross per 24 hour  Intake   3805 ml  Output   2100 ml  Net   1705 ml    Intake/Output this shift:    Labs:  Basename 04/10/12 0443 04/09/12 1857 04/09/12 1240  HGB 10.7* 11.5* 13.1    Basename 04/10/12 0443 04/09/12 1857  WBC 6.9 8.4  RBC 3.47* 3.66*  HCT 31.1* 32.3*  PLT 309 345    Basename 04/10/12 0443 04/09/12 1857  NA 134* --  K 3.9 --  CL 101 --  CO2 23 --  BUN 16 --  CREATININE 0.95 0.87  GLUCOSE 182* --  CALCIUM 8.4 --   No results found for this basename: LABPT:2,INR:2 in the last 72 hours  EXAM General - Patient is Alert, Appropriate and Oriented Extremity - Neurovascular intact Sensation intact distally Dorsiflexion/Plantar flexion intact Dressing - dressing C/D/I Motor Function - intact, moving foot and toes well on exam.  Hemovac pulled without difficulty.  Past Medical History  Diagnosis Date  . Hypertension   . Diabetes mellitus   . Arthritis   . Myocardial infarction 1985    FOLLOWED BY DR. Shana Chute  . Shortness of breath     WITH EXERTION  . Neuromuscular disorder     TINGLING IN BOTH HANDS    Assessment/Plan: 1 Day Post-Op Procedure(s) (LRB): TOTAL HIP ARTHROPLASTY (Right) Principal  Problem:  *OA (osteoarthritis) of hip  Estimated Body mass index is 32.06 kg/(m^2) as calculated from the following:   Height as of this encounter: 6\' 1" (1.854 m).   Weight as of this encounter: 243 lb(110.224 kg). Advance diet Up with therapy Plan for discharge tomorrow Discharge home with home health  DVT Prophylaxis - Xarelto Weight Bearing As Tolerated right Leg D/C Knee Immobilizer Hemovac Pulled Begin Therapy Hip Preacutions No vaccines.  [name] 04/10/2012, 9:39 AM

## 2012-04-10 NOTE — Progress Notes (Signed)
Utilization review completed.  

## 2012-04-10 NOTE — Progress Notes (Signed)
Physical Therapy Treatment Patient Details Name: [name] MRN: [id number] DOB: 1945-04-23 Today's Date: 04/10/2012 Time: 4098-1191 PT Time Calculation (min): 26 min  PT Assessment / Plan / Recommendation Comments on Treatment Session  Progressing well. Plan is for d/c home tomorrow. Will plan to practice steps.     Follow Up Recommendations  Home health PT;Supervision/Assistance - 24 hour     Does the patient have the potential to tolerate intense rehabilitation     Barriers to Discharge        Equipment Recommendations  None recommended by PT    Recommendations for Other Services OT consult  Frequency 7X/week   Plan Discharge plan remains appropriate    Precautions / Restrictions Precautions Precautions: Posterior Hip Restrictions Weight Bearing Restrictions: No RLE Weight Bearing: Weight bearing as tolerated   Pertinent Vitals/Pain 8/10 R hip with activity; 0/10 at rest    Mobility  Bed Mobility Bed Mobility: Not assessed Transfers Transfers: Sit to Stand;Stand to Sit Sit to Stand: 4: Min assist;From chair/3-in-1;With armrests;With upper extremity assist Stand to Sit: 4: Min assist;To chair/3-in-1;With armrests;With upper extremity assist Details for Transfer Assistance: Assist to rise. min vcs for ue/le placement  Ambulation/Gait Ambulation/Gait Assistance: 4: Min guard Ambulation Distance (Feet): 110 Feet Assistive device: Rolling walker Ambulation/Gait Assistance Details: VCs safety, technique, sequence. Slow gait speed Gait Pattern: Step-to pattern;Decreased stride length;Decreased step length - right;Antalgic;Trunk flexed    Exercises Total Joint Exercises Ankle Circles/Pumps: AROM;Both;10 reps;Seated Quad Sets: AROM;Both;10 reps;Seated Heel Slides: AAROM;Right;10 reps;Supine Hip ABduction/ADduction: AAROM;Right;10 reps;Supine   PT Diagnosis:    PT Problem List:   PT Treatment Interventions:     PT Goals Acute Rehab PT Goals Pt will go Sit  to Stand: with supervision PT Goal: Sit to Stand - Progress: Progressing toward goal Pt will Ambulate: 51 - 150 feet;with supervision;with rolling walker PT Goal: Ambulate - Progress: Progressing toward goal Pt will Perform Home Exercise Program: with supervision, verbal cues required/provided PT Goal: Perform Home Exercise Program - Progress: Progressing toward goal  Visit Information  Last PT Received On: 04/10/12 Assistance Needed: +1    Subjective Data  Subjective: "I'm in no hurry to get back in that bed" Patient Stated Goal: home   Cognition  Overall Cognitive Status: Appears within functional limits for tasks assessed/performed Arousal/Alertness: Awake/alert Orientation Level: Appears intact for tasks assessed Behavior During Session: [name] for tasks performed    Balance     End of Session PT - End of Session Equipment Utilized During Treatment: Gait belt Activity Tolerance: Patient tolerated treatment well Patient left: in chair;with call bell/phone within reach   GP     Rebeca Alert [place] 04/10/2012, 2:27 PM [phone]

## 2012-04-10 NOTE — Evaluation (Signed)
Occupational Therapy Evaluation Patient Details Name: [name] MRN: [id number] DOB: [birth year] Today's Date: 04/10/2012 Time: 4098-1191 and 1223 - 1233 OT Time Calculation (min):21 min  OT Assessment / Plan / Recommendation Clinical Impression  This 66 year old man was admitted for R THA.  He has posterior THPs.  He will benefit from skilled OT to increase safety and independence with adls.  Goals in acute are min A level.      OT Assessment  Patient needs continued OT Services    Follow Up Recommendations  No OT follow up (likely);Supervision/Assistance - 24 hour    Barriers to Discharge      Equipment Recommendations  None recommended by OT    Recommendations for Other Services    Frequency  Min 2X/week    Precautions / Restrictions Precautions Precautions: Fall;Posterior Hip Precaution Comments: Verbally reviewed  and demonstrated post precautions and WB status Restrictions Weight Bearing Restrictions: No RLE Weight Bearing: Weight bearing as tolerated   Pertinent Vitals/Pain No pain sitting, 8/10 R hip with weightbearing    ADL  Lower Body Bathing: Simulated;Minimal assistance (mod A for sit to stand, min A bathing with reacher) Where Assessed - Lower Body Bathing: Supported sit to stand Lower Body Dressing: Simulated;Moderate assistance (pants min A with reacher, mod A sit to stand) Toilet Transfer: Performed (mod A sit to stand, min A steps) Toilet Transfer Method: Stand pivot Acupuncturist: Bedside commode Toileting - Clothing Manipulation and Hygiene: Simulated;Minimal assistance (mod sit to stand) Transfers/Ambulation Related to ADLs: SPT only.  Saw pt in two sessions due to pain.  After premedication, he was still 8/10 R hip with weightbearing ADL Comments: Reviewed AE and ADLs  Pt states he will have 24/7 with family    OT Diagnosis: Generalized weakness  OT Problem List: Decreased activity tolerance;Pain;Decreased knowledge of use of DME  or AE;Decreased knowledge of precautions;Decreased strength OT Treatment Interventions: Self-care/ADL training;DME and/or AE instruction;Patient/family education   OT Goals Acute Rehab OT Goals OT Goal Formulation: With patient Time For Goal Achievement: 04/17/12 Potential to Achieve Goals: Good ADL Goals Pt Will Perform Lower Body Bathing: with min assist;Sit to stand from chair;with adaptive equipment (min guard, sit to stand min A) ADL Goal: Lower Body Bathing - Progress: Goal set today Pt Will Perform Lower Body Dressing: with min assist;Sit to stand from chair;with adaptive equipment (pants only) ADL Goal: Lower Body Dressing - Progress: Goal set today Pt Will Transfer to Toilet: with min assist;3-in-1;Maintaining hip precautions;Ambulation (min A sit to stand, min guard steps) ADL Goal: Toilet Transfer - Progress: Goal set today Pt Will Perform Toileting - Hygiene: with supervision;Standing at 3-in-1/toilet (and clothes management) ADL Goal: Toileting - Hygiene - Progress: Goal set today Pt Will Perform Tub/Shower Transfer: Shower transfer;with min assist;Ambulation (min guard walking, min A sit to stand) ADL Goal: Tub/Shower Transfer - Progress: Goal set today Miscellaneous OT Goals Miscellaneous OT Goal #1: Pt will recall 3/3 thps OT Goal: Miscellaneous Goal #1 - Progress: Goal set today  Visit Information  Last OT Received On: 04/10/12 Assistance Needed: +1    Subjective Data  Subjective: I'm starting to have some pain   Prior Functioning     Home Living Home Layout: Two level;Bed/bath upstairs Bathroom Shower/Tub: Health visitor: Standard Home Adaptive Equipment: Walker - rolling;Bedside commode/3-in-1;Crutches;Shower chair without back Additional Comments: has reacher Prior Function Level of Independence: Independent Able to Take Stairs?: Yes Driving: Yes 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) Communication Communication: No  difficulties         Vision/Perception     Cognition  Overall Cognitive Status: Appears within functional limits for tasks assessed/performed Arousal/Alertness: Awake/alert Orientation Level: Appears intact for tasks assessed Behavior During Session: [facility] for tasks performed    Extremity/Trunk Assessment Right Upper Extremity Assessment RUE ROM/Strength/Tone: Within functional levels Left Upper Extremity Assessment LUE ROM/Strength/Tone: Within functional levels l     Mobility Mod A for sit to stand from 3:1 and recliner, min cues for ue/le placement     Shoulder Instructions     Exercise     Balance     End of Session OT - End of Session Activity Tolerance: Patient limited by pain Patient left: in chair;with call bell/phone within reach  GO     [name] 04/10/2012, 1:18 PM Marica Otter, OTR/L [phone] 04/10/2012

## 2012-04-10 NOTE — Evaluation (Signed)
Physical Therapy Evaluation Patient Details Name: [name] MRN: [id number] DOB: 08-22-1945 Today's Date: 04/10/2012 Time: 1191-4782 PT Time Calculation (min): 24 min  PT Assessment / Plan / Recommendation Clinical Impression  66 yo male s/p R THA. Mobilizing slowly but fairly well. Pt plans to d/c home-lives alone but states he has plenty of family/friends to assist as needed. Recommend HHPT and 24 hour supervision/assist initially at discharge.     PT Assessment  Patient needs continued PT services    Follow Up Recommendations  Home health PT;Supervision/Assistance - 24 hour    Does the patient have the potential to tolerate intense rehabilitation      Barriers to Discharge        Equipment Recommendations  None recommended by PT    Recommendations for Other Services OT consult   Frequency 7X/week    Precautions / Restrictions Precautions Precautions: Fall;Posterior Hip Precaution Comments: Verbally reviewed  and demonstrated post precautions and WB status Restrictions Weight Bearing Restrictions: No RLE Weight Bearing: Weight bearing as tolerated   Pertinent Vitals/Pain R hip-unrated "sore...only when I move it"      Mobility  Bed Mobility Bed Mobility: Supine to Sit Supine to Sit: 4: Min assist Details for Bed Mobility Assistance: Assist for R LE off bed. VCs safety, technique, hand placement.  Transfers Transfers: Sit to Stand;Stand to Sit Sit to Stand: 3: Mod assist;From bed;From elevated surface;With upper extremity assist Stand to Sit: 4: Min assist;To chair/3-in-1;With armrests;With upper extremity assist Details for Transfer Assistance: VCs safety, technique, hand placement. Assist to rise, stabilize, control descent.  Ambulation/Gait Ambulation/Gait Assistance: 4: Min guard Ambulation Distance (Feet): 50 Feet Assistive device: Rolling walker Ambulation/Gait Assistance Details: VCs safety, technique, sequence. Slow gait speed. Fatigues fairly  easily.  Gait Pattern: Step-to pattern;Decreased stride length;Decreased step length - right;Antalgic;Trunk flexed    Shoulder Instructions     Exercises     PT Diagnosis: Difficulty walking;Abnormality of gait;Acute pain  PT Problem List: Decreased strength;Decreased range of motion;Decreased activity tolerance;Decreased mobility;Pain;Decreased knowledge of precautions;Decreased knowledge of use of DME PT Treatment Interventions: DME instruction;Gait training;Stair training;Functional mobility training;Therapeutic activities;Therapeutic exercise   PT Goals Acute Rehab PT Goals PT Goal Formulation: With patient Time For Goal Achievement: 04/17/12 Potential to Achieve Goals: Good Pt will go Supine/Side to Sit: with supervision PT Goal: Supine/Side to Sit - Progress: Goal set today Pt will go Sit to Supine/Side: with supervision PT Goal: Sit to Supine/Side - Progress: Goal set today Pt will go Sit to Stand: with supervision PT Goal: Sit to Stand - Progress: Goal set today Pt will Ambulate: 51 - 150 feet;with supervision;with rolling walker PT Goal: Ambulate - Progress: Goal set today Pt will Go Up / Down Stairs: 6-9 stairs;with min assist;with rail(s);with least restrictive assistive device PT Goal: Up/Down Stairs - Progress: Goal set today Pt will Perform Home Exercise Program: with supervision, verbal cues required/provided PT Goal: Perform Home Exercise Program - Progress: Goal set today  Visit Information  Last PT Received On: 04/10/12 Assistance Needed: +1    Subjective Data  Subjective: "It (leg) doesn't want to go" Patient Stated Goal: Home   Prior Functioning  Home Living Lives With: Alone Available Help at Discharge: Family Type of Home: House Home Access: Stairs to enter Secretary/administrator of Steps: 3 Entrance Stairs-Rails: None Home Layout: Two level;Bed/bath upstairs Alternate Level Stairs-Number of Steps: 1 flight Alternate Level Stairs-Rails:  Left Bathroom Shower/Tub: Health visitor: Standard Home Adaptive Equipment: Walker - rolling;Bedside commode/3-in-1;Crutches Prior  Function Level of Independence: Independent Able to Take Stairs?: Yes Driving: Yes Communication Communication: No difficulties    Cognition  Overall Cognitive Status: Appears within functional limits for tasks assessed/performed Arousal/Alertness: Awake/alert Orientation Level: Appears intact for tasks assessed Behavior During Session: [facility] for tasks performed    Extremity/Trunk Assessment Right Lower Extremity Assessment RLE ROM/Strength/Tone: Deficits RLE ROM/Strength/Tone Deficits: hip flex 2/5, moves ankle well, hip adduction 2/5 RLE Sensation: WFL - Light Touch Left Lower Extremity Assessment LLE ROM/Strength/Tone: WFL for tasks assessed Trunk Assessment Trunk Assessment: Normal   Balance    End of Session PT - End of Session Equipment Utilized During Treatment: Gait belt Activity Tolerance: Patient limited by fatigue Patient left: in chair;with call bell/phone within reach  GP     Rebeca Alert [facility] 04/10/2012, 9:25 AM [phone]

## 2012-04-11 LAB — CBC
Hemoglobin: 10.4 g/dL — ABNORMAL LOW (ref 13.0–17.0)
MCHC: 34.7 g/dL (ref 30.0–36.0)
Platelets: 291 10*3/uL (ref 150–400)
RDW: 13.9 % (ref 11.5–15.5)

## 2012-04-11 LAB — BASIC METABOLIC PANEL
GFR calc Af Amer: >90 mL/min (ref >90)
GFR calc non Af Amer: 84 mL/min — ABNORMAL LOW (ref >90)
Glucose, Bld: 171 mg/dL — ABNORMAL HIGH (ref 70–99)
Potassium: 4.2 mEq/L (ref 3.5–5.1)
Sodium: 130 mEq/L — ABNORMAL LOW (ref 135–145)

## 2012-04-11 MED ORDER — METHOCARBAMOL 500 MG PO TABS: 500.0000 mg | ORAL_TABLET | Freq: Four times a day (QID) | ORAL | Status: DC | PRN

## 2012-04-11 MED ORDER — RIVAROXABAN 10 MG PO TABS: 10.0000 mg | ORAL_TABLET | Freq: Every day | ORAL | Status: DC

## 2012-04-11 MED ORDER — OXYCODONE-ACETAMINOPHEN 5-325 MG PO TABS: 1.0000 | ORAL_TABLET | ORAL | Status: DC | PRN

## 2012-04-11 NOTE — Progress Notes (Signed)
Occupational Therapy Treatment Patient Details Name: [name] MRN: [id number] DOB: 03-09-1946 Today's Date: 04/11/2012 Time: 7829-5621 OT Time Calculation (min): 12 min  OT Assessment / Plan / Recommendation Comments on Treatment Session Pt is doing well Educated on shower transfer and reviewed THPs. Pt supposed to d/c today.    Follow Up Recommendations  No OT follow up;Supervision/Assistance - 24 hour    Barriers to Discharge       Equipment Recommendations  None recommended by OT    Recommendations for Other Services    Frequency Min 2X/week   Plan Discharge plan remains appropriate    Precautions / Restrictions Precautions Precautions: Posterior Hip Precaution Comments: pt able to state 2/3 precautions. Reviewed all with pt Restrictions Weight Bearing Restrictions: No RLE Weight Bearing: Weight bearing as tolerated        ADL  Tub/Shower Transfer: Simulated;Minimal assistance Equipment Used: Rolling walker ADL Comments: Pt states he has AE for LB dressing and he doesnt feel he needs to practice further. He states someone will be able to assist with showering PRN. Discussed safety with placement of shower chair and use of RW to step in and out. Also discussed caregiver stabilizing RW as she steps in and out of shower. Reviewed THPs and pt diong well.     OT Diagnosis:    OT Problem List:   OT Treatment Interventions:     OT Goals ADL Goals ADL Goal: Tub/Shower Transfer - Progress: Met Miscellaneous OT Goals OT Goal: Miscellaneous Goal #1 - Progress: Progressing toward goals  Visit Information  Last OT Received On: 04/11/12 Assistance Needed: +1    Subjective Data  Subjective: hey Patient Stated Goal: none stated. pt agreeable to work with OT for d/c today   Prior Functioning       Cognition  Overall Cognitive Status: Appears within functional limits for tasks assessed/performed Arousal/Alertness: Awake/alert Orientation Level: Appears intact for  tasks assessed Behavior During Session: [facility] for tasks performed    Mobility  Shoulder Instructions Transfers Transfers: Sit to Stand;Stand to Sit Sit to Stand: 4: Min guard;With upper extremity assist;From chair/3-in-1 Stand to Sit: 4: Min guard;With upper extremity assist;To chair/3-in-1 Details for Transfer Assistance: min verbal cues for THPs       Exercises      Balance     End of Session OT - End of Session Activity Tolerance: Patient tolerated treatment well Patient left: in chair;with call bell/phone within reach  GO     [name] [phone] 04/11/2012, 9:44 AM

## 2012-04-11 NOTE — Progress Notes (Signed)
   Subjective: 2 Days Post-Op Procedure(s) (LRB): TOTAL HIP ARTHROPLASTY (Right) Patient reports pain as mild.   Patient is doing well and ready to go home Plan is to go Home after hospital stay.  Objective: Vital signs in last 24 hours: Temp:  [97.6 F (36.4 C)-98.9 F (37.2 C)] 97.6 F (36.4 C) (01/01 0637) Pulse Rate:  [75-80] 76  (01/01 0637) Resp:  [16] 16  (01/01 0637) BP: (115-164)/(66-77) 164/77 mmHg (01/01 0637) SpO2:  [94 %-96 %] 96 % (01/01 0637)  Intake/Output from previous day:  Intake/Output Summary (Last 24 hours) at 04/11/12 0758 Last data filed at 04/11/12 1610  Gross per 24 hour  Intake   1120 ml  Output   1675 ml  Net   -555 ml    Intake/Output this shift:    Labs:  Basename 04/11/12 0435 04/10/12 0443 04/09/12 1857 04/09/12 1240  HGB 10.4* 10.7* 11.5* 13.1    Basename 04/11/12 0435 04/10/12 0443  WBC 7.3 6.9  RBC 3.35* 3.47*  HCT 30.0* 31.1*  PLT 291 309    Basename 04/11/12 0435 04/10/12 0443  NA 130* 134*  K 4.2 3.9  CL 98 101  CO2 23 23  BUN 21 16  CREATININE 0.97 0.95  GLUCOSE 171* 182*  CALCIUM 8.6 8.4   No results found for this basename: LABPT:2,INR:2 in the last 72 hours  EXAM General - Patient is Alert, Appropriate and Oriented Extremity - Neurologically intact Neurovascular intact Incision: dressing C/D/I No cellulitis present Compartment soft Dressing/Incision - clean, dry, no drainage Motor Function - intact, moving foot and toes well on exam.   Past Medical History  Diagnosis Date  . Hypertension   . Diabetes mellitus   . Arthritis   . Myocardial infarction 1985    FOLLOWED BY DR. Shana Chute  . Shortness of breath     WITH EXERTION  . Neuromuscular disorder     TINGLING IN BOTH HANDS    Assessment/Plan: 2 Days Post-Op Procedure(s) (LRB): TOTAL HIP ARTHROPLASTY (Right) Principal Problem:  *OA (osteoarthritis) of hip   D/C IV fluids Discharge home with home health  DVT Prophylaxis - Xarelto Weight  Bearing As Tolerated right Leg  Carlissa Pesola V 04/11/2012, 7:58 AM

## 2012-04-11 NOTE — Progress Notes (Signed)
Pt to d/c home with home health. AVS reviewed and "My Chart" discussed with pt. Pt capable of verbalizing medications and follow-up appointments with Dr. Aluisio. Remains hemodynamically stable. No signs and symptoms of distress. Educated pt to return to ER in the case of SOB, dizziness, or chest pain.  

## 2012-04-11 NOTE — Progress Notes (Signed)
Physical Therapy Treatment Patient Details Name: [name] MRN: [id number] DOB: 09-Apr-1946 Today's Date: 04/11/2012 Time: 4098-1191 PT Time Calculation (min): 23 min  PT Assessment / Plan / Recommendation Comments on Treatment Session  Progressing well. Will practice steps this pm. plan is to d/c home today.    Follow Up Recommendations  Home health PT;Supervision/Assistance - 24 hour (initially)     Does the patient have the potential to tolerate intense rehabilitation     Barriers to Discharge        Equipment Recommendations  None recommended by PT    Recommendations for Other Services OT consult  Frequency 7X/week   Plan Discharge plan remains appropriate    Precautions / Restrictions Precautions Precautions: Posterior Hip Precaution Comments: Reviewed hip precautions. Pt able to recall 1/3.  Restrictions Weight Bearing Restrictions: No RLE Weight Bearing: Weight bearing as tolerated   Pertinent Vitals/Pain 0/10 pain     Mobility  Bed Mobility Bed Mobility: Not assessed Transfers Transfers: Sit to Stand;Stand to Sit Sit to Stand: 4: Min guard;From chair/3-in-1;With armrests;With upper extremity assist Stand to Sit: 4: Min guard;To chair/3-in-1;With armrests;With upper extremity assist Details for Transfer Assistance: VCs safety, technique, hand placement.  Ambulation/Gait Ambulation/Gait Assistance: 4: Min guard Ambulation Distance (Feet): 130 Feet Assistive device: Rolling walker Ambulation/Gait Assistance Details: VCs safety, posture, sequence, technique. Slow gait speed.  Gait Pattern: Step-to pattern;Trunk flexed;Decreased stride length;Decreased step length - right    Exercises Total Joint Exercises Ankle Circles/Pumps: AROM;Both;10 reps;Seated Quad Sets: AROM;Both;10 reps;Seated Heel Slides: AAROM;Right;10 reps;Supine Hip ABduction/ADduction: AAROM;Right;10 reps;Seated   PT Diagnosis:    PT Problem List:   PT Treatment Interventions:     PT  Goals Acute Rehab PT Goals Pt will go Sit to Stand: with supervision PT Goal: Sit to Stand - Progress: Progressing toward goal Pt will Ambulate: 51 - 150 feet;with supervision;with rolling walker PT Goal: Ambulate - Progress: Progressing toward goal Pt will Perform Home Exercise Program: with supervision, verbal cues required/provided PT Goal: Perform Home Exercise Program - Progress: Progressing toward goal  Visit Information  Last PT Received On: 04/11/12 Assistance Needed: +1    Subjective Data  Subjective: "I'm ready" Patient Stated Goal: home today   Cognition  Overall Cognitive Status: Appears within functional limits for tasks assessed/performed Arousal/Alertness: Awake/[name] Orientation Level: Appears intact for tasks assessed Behavior During Session: [facility] for tasks performed    Balance     End of Session PT - End of Session Equipment Utilized During Treatment: Gait belt Activity Tolerance: Patient tolerated treatment well Patient left: in chair;with call bell/phone within reach   GP     [name] [name] 04/11/2012, 10:43 AM [phone]

## 2012-04-11 NOTE — Progress Notes (Signed)
Physical Therapy Treatment Patient Details Name: [name] MRN: [id number] DOB: Jan 29, 1946 Today's Date: 04/11/2012 Time: 1012-1030 PT Time Calculation (min): 18 min  PT Assessment / Plan / Recommendation Comments on Treatment Session  2nd session. Practiced steps and reviewed car transfer. No further questions from pt. All education completed. ready to d/c home.     Follow Up Recommendations  Home health PT;Supervision/Assistance - 24 hour     Does the patient have the potential to tolerate intense rehabilitation     Barriers to Discharge        Equipment Recommendations  None recommended by PT    Recommendations for Other Services OT consult  Frequency 7X/week   Plan Discharge plan remains appropriate    Precautions / Restrictions Precautions Precautions: Posterior Hip Precaution Comments: Reviewed hip precautions. Pt able to recall 1/3.  Restrictions Weight Bearing Restrictions: No RLE Weight Bearing: Weight bearing as tolerated   Pertinent Vitals/Pain 10/10 R hip-RN made aware    Mobility  Bed Mobility Bed Mobility: Not assessed Transfers Transfers: Sit to Stand;Stand to Sit Sit to Stand: 4: Min guard;From chair/3-in-1;With armrests;With upper extremity assist Stand to Sit: 4: Min guard;To chair/3-in-1;With armrests;With upper extremity assist Details for Transfer Assistance: VCs safety, technique, hand placement.  Ambulation/Gait Stairs: Yes Stairs Assistance: 4: Min assist Stairs Assistance Details (indicate cue type and reason): VCs safety, technique, sequence. Practiced steps x 2-once with RW (to enter home), and once with 1crutch, 1rail (to get up to bedroom). Pt has flight up to bedroom but did not feel the need to practice -"Ive done this before." Stair Management Technique: No rails;With walker;Forwards;Backwards;Step to pattern;One rail Left Number of Stairs: 4     Exercises    PT Diagnosis:    PT Problem List:   PT Treatment Interventions:      PT Goals Acute Rehab PT Goals Pt will go Sit to Stand: with supervision PT Goal: Sit to Stand - Progress: Progressing toward goal Pt will Ambulate: 51 - 150 feet;with supervision;with rolling walker PT Goal: Ambulate - Progress: Progressing toward goal Pt will Go Up / Down Stairs: 6-9 stairs;with min assist;with rail(s) PT Goal: Up/Down Stairs - Progress: Partly met Pt will Perform Home Exercise Program: with supervision, verbal cues required/provided PT Goal: Perform Home Exercise Program - Progress: Progressing toward goal  Visit Information  Last PT Received On: 04/11/12 Assistance Needed: +1    Subjective Data  Subjective: "Ive done this before....many times" Patient Stated Goal: home   Cognition  Overall Cognitive Status: Appears within functional limits for tasks assessed/performed Arousal/Alertness: Awake/alert Orientation Level: Appears intact for tasks assessed Behavior During Session: [facility] for tasks performed    Balance     End of Session PT - End of Session Equipment Utilized During Treatment: Gait belt Activity Tolerance: Patient tolerated treatment well Patient left: in chair;with call bell/phone within reach   GP     Rebeca Alert [facility] 04/11/2012, 10:49 AM [phone]

## 2012-04-12 NOTE — Progress Notes (Signed)
CARE MANAGEMENT NOTE 04/12/2012  Patient:  HRISTOPHER, MISSILDINE   Account Number:  [id number]  Date Initiated:  04/10/2012  Documentation initiated by:  Colleen Can  Subjective/Objective Assessment:   DX OSTEOARTHRITIS RIGHT HIP; TOTAL HIP REPLACEMNT     Action/Plan:   CM SPOKE WITH PATIENT. pLANS ARE FOR PATIENT TO RETURN TO HIS HOME IN [place] WHERE HE WILL HAVE CAREGIVERS. ALREADY HAS DME WHICH INCLUDES RW. WANTS HH AGENCY THAT IS IN NETWORK.   Anticipated DC Date:  04/13/2011   Anticipated DC Plan:  HOME W HOME HEALTH SERVICES  In-house referral  NA      DC Planning Services  CM consult      [facility] Choice  HOME HEALTH   Choice offered to / List presented to:  C-1 Patient   DME arranged  NA      DME agency  NA     HH arranged  HH-2 PT      HH agency  [facility]   Status of service:  Completed, signed off Medicare Important Message given?  NA - LOS <3 / Initial given by admissions (If response is "NO", the following Medicare IM given date fields will be blank) Date Medicare IM given:   Date Additional Medicare IM given:    Discharge Disposition:  HOME W HOME HEALTH SERVICES  Per UR Regulation:    If discussed at Long Length of Stay Meetings, dates discussed:    Comments:  04/12/2012 Damaris Schooner RN CCM [facility] will provide HHpt services with start date 04/13/2012 . HHorders, face sheet, op note, H&P. PT notes faxed to [phone] with confirmation

## 2012-04-16 DIAGNOSIS — E871 Hypo-osmolality and hyponatremia: Secondary | ICD-10-CM

## 2012-04-16 DIAGNOSIS — D62 Acute posthemorrhagic anemia: Secondary | ICD-10-CM

## 2012-04-16 NOTE — Discharge Summary (Signed)
Physician Discharge Summary   Patient ID: [name] MRN: [id number] DOB/AGE: 1945-06-05 67 y.o.  Admit date: 04/09/2012 Discharge date: 04/16/2012  Primary Diagnosis: Osteoarthritis Right hip   Admission Diagnoses:  Past Medical History  Diagnosis Date  . Hypertension   . Diabetes mellitus   . Arthritis   . Myocardial infarction 1985    FOLLOWED BY DR. Shana Chute  . Shortness of breath     WITH EXERTION  . Neuromuscular disorder     TINGLING IN BOTH HANDS   Discharge Diagnoses:   Principal Problem:  *OA (osteoarthritis) of hip Active Problems:  Postop Hyponatremia  Postop Acute blood loss anemia  Estimated Body mass index is 32.06 kg/(m^2) as calculated from the following:   Height as of this encounter: 6\' 1" (1.854 m).   Weight as of this encounter: 243 lb(110.224 kg).  Classification of overweight in adults according to BMI (WHO, 1998)   Procedure: Procedure(s) (LRB): TOTAL HIP ARTHROPLASTY (Right)   Consults: None  HPI: [name] is a 67 y.o. male with end stage arthritis of his right hip with progressively worsening pain and dysfunction. Pain occurs with activity and rest including pain at night. He has tried analgesics, protected weight bearing and rest without benefit. Pain is too severe to attempt physical therapy. Radiographs demonstrate bone on bone arthritis with subchondral cyst formation. He presents now for right THA.  Laboratory Data: Admission on 04/09/2012, Discharged on 04/11/2012  Component Date Value Range Status  . WBC 04/09/2012 4.8  4.0 - 10.5 K/uL Final  . RBC 04/09/2012 4.17* 4.22 - 5.81 MIL/uL Final  . Hemoglobin 04/09/2012 13.1  13.0 - 17.0 g/dL Final  . HCT 57/84/6962 36.8* 39.0 - 52.0 % Final  . MCV 04/09/2012 88.2  78.0 - 100.0 fL Final  . MCH 04/09/2012 31.4  26.0 - 34.0 pg Final  . MCHC 04/09/2012 35.6  30.0 - 36.0 g/dL Final  . RDW 95/28/4132 13.7  11.5 - 15.5 % Final  . Platelets 04/09/2012 372  150 - 400 K/uL Final  .  ABO/RH(D) 04/09/2012 A POS   Final  . Antibody Screen 04/09/2012 NEG   Final  . Sample Expiration 04/09/2012 04/12/2012   Final  . Glucose-Capillary 04/09/2012 100* 70 - 99 mg/dL Final  . Comment 1 44/04/270 Documented in Chart   Final  . Glucose-Capillary 04/09/2012 102* 70 - 99 mg/dL Final  . Comment 1 53/66/4403 Documented in Chart   Final  . Comment 2 04/09/2012 Notify RN   Final  . Glucose-Capillary 04/09/2012 142* 70 - 99 mg/dL Final  . Comment 1 47/42/5956 Documented in Chart   Final  . WBC 04/10/2012 6.9  4.0 - 10.5 K/uL Final  . RBC 04/10/2012 3.47* 4.22 - 5.81 MIL/uL Final  . Hemoglobin 04/10/2012 10.7* 13.0 - 17.0 g/dL Final  . HCT 38/75/6433 31.1* 39.0 - 52.0 % Final  . MCV 04/10/2012 89.6  78.0 - 100.0 fL Final  . MCH 04/10/2012 30.8  26.0 - 34.0 pg Final  . MCHC 04/10/2012 34.4  30.0 - 36.0 g/dL Final  . RDW 29/51/8841 13.9  11.5 - 15.5 % Final  . Platelets 04/10/2012 309  150 - 400 K/uL Final  . Sodium 04/10/2012 134* 135 - 145 mEq/L Final  . Potassium 04/10/2012 3.9  3.5 - 5.1 mEq/L Final  . Chloride 04/10/2012 101  96 - 112 mEq/L Final  . CO2 04/10/2012 23  19 - 32 mEq/L Final  . Glucose, Bld 04/10/2012 182* 70 - 99 mg/dL  Final  . BUN 04/10/2012 16  6 - 23 mg/dL Final  . Creatinine, Ser 04/10/2012 0.95  0.50 - 1.35 mg/dL Final  . Calcium 16/01/9603 8.4  8.4 - 10.5 mg/dL Final  . GFR calc non Af Amer 04/10/2012 85* >90 mL/min Final  . GFR calc Af Amer 04/10/2012 >90  >90 mL/min Final   Comment:                                 The eGFR has been calculated                          using the CKD EPI equation.                          This calculation has not been                          validated in all clinical                          situations.                          eGFR's persistently                          <90 mL/min signify                          possible Chronic Kidney Disease.  . WBC 04/09/2012 8.4  4.0 - 10.5 K/uL Final  . RBC 04/09/2012 3.66*  4.22 - 5.81 MIL/uL Final  . Hemoglobin 04/09/2012 11.5* 13.0 - 17.0 g/dL Final  . HCT 54/12/8117 32.3* 39.0 - 52.0 % Final  . MCV 04/09/2012 88.3  78.0 - 100.0 fL Final  . MCH 04/09/2012 31.4  26.0 - 34.0 pg Final  . MCHC 04/09/2012 35.6  30.0 - 36.0 g/dL Final  . RDW 14/78/2956 13.7  11.5 - 15.5 % Final  . Platelets 04/09/2012 345  150 - 400 K/uL Final  . Creatinine, Ser 04/09/2012 0.87  0.50 - 1.35 mg/dL Final  . GFR calc non Af Amer 04/09/2012 88* >90 mL/min Final  . GFR calc Af Amer 04/09/2012 >90  >90 mL/min Final   Comment:                                 The eGFR has been calculated                          using the CKD EPI equation.                          This calculation has not been                          validated in all clinical                          situations.  eGFR's persistently                          <90 mL/min signify                          possible Chronic Kidney Disease.  . Glucose-Capillary 04/09/2012 169* 70 - 99 mg/dL Final  . Comment 1 14/78/2956 Notify RN   Final  . Comment 2 04/09/2012 Documented in Chart   Final  . Glucose-Capillary 04/09/2012 181* 70 - 99 mg/dL Final  . Glucose-Capillary 04/10/2012 147* 70 - 99 mg/dL Final  . Comment 1 21/30/8657 Notify RN   Final  . Comment 2 04/10/2012 Documented in Chart   Final  . Glucose-Capillary 04/10/2012 156* 70 - 99 mg/dL Final  . Comment 1 84/69/6295 Notify RN   Final  . Comment 2 04/10/2012 Documented in Chart   Final  . WBC 04/11/2012 7.3  4.0 - 10.5 K/uL Final  . RBC 04/11/2012 3.35* 4.22 - 5.81 MIL/uL Final  . Hemoglobin 04/11/2012 10.4* 13.0 - 17.0 g/dL Final  . HCT 28/41/3244 30.0* 39.0 - 52.0 % Final  . MCV 04/11/2012 89.6  78.0 - 100.0 fL Final  . MCH 04/11/2012 31.0  26.0 - 34.0 pg Final  . MCHC 04/11/2012 34.7  30.0 - 36.0 g/dL Final  . RDW 04/13/7251 13.9  11.5 - 15.5 % Final  . Platelets 04/11/2012 291  150 - 400 K/uL Final  . Sodium 04/11/2012 130* 135 - 145  mEq/L Final  . Potassium 04/11/2012 4.2  3.5 - 5.1 mEq/L Final  . Chloride 04/11/2012 98  96 - 112 mEq/L Final  . CO2 04/11/2012 23  19 - 32 mEq/L Final  . Glucose, Bld 04/11/2012 171* 70 - 99 mg/dL Final  . BUN 66/44/0347 21  6 - 23 mg/dL Final  . Creatinine, Ser 04/11/2012 0.97  0.50 - 1.35 mg/dL Final  . Calcium 42/59/5638 8.6  8.4 - 10.5 mg/dL Final  . GFR calc non Af Amer 04/11/2012 84* >90 mL/min Final  . GFR calc Af Amer 04/11/2012 >90  >90 mL/min Final   Comment:                                 The eGFR has been calculated                          using the CKD EPI equation.                          This calculation has not been                          validated in all clinical                          situations.                          eGFR's persistently                          <90 mL/min signify  possible Chronic Kidney Disease.  . Glucose-Capillary 04/10/2012 173* 70 - 99 mg/dL Final  . Comment 1 16/01/9603 Notify RN   Final  . Comment 2 04/10/2012 Documented in Chart   Final  . Glucose-Capillary 04/10/2012 196* 70 - 99 mg/dL Final  . Glucose-Capillary 04/11/2012 158* 70 - 99 mg/dL Final  . Comment 1 54/12/8117 Notify RN   Final  Hospital Outpatient Visit on 04/02/2012  Component Date Value Range Status  . MRSA, PCR 04/02/2012 NEGATIVE  NEGATIVE Final  . Staphylococcus aureus 04/02/2012 NEGATIVE  NEGATIVE Final   Comment:                                 The Xpert SA Assay (FDA                          approved for NASAL specimens                          in patients over 52 years of age),                          is one component of                          a comprehensive surveillance                          program.  Test performance has                          been validated by [employer] for patients greater                          than or equal to 26 year old.                          It is not intended                            to diagnose infection nor to                          guide or monitor treatment.  Marland Kitchen aPTT 04/02/2012 29  24 - 37 seconds Final  . WBC 04/02/2012 5.1  4.0 - 10.5 K/uL Final  . RBC 04/02/2012 4.16* 4.22 - 5.81 MIL/uL Final  . Hemoglobin 04/02/2012 12.9* 13.0 - 17.0 g/dL Final  . HCT 14/78/2956 37.1* 39.0 - 52.0 % Final  . MCV 04/02/2012 89.2  78.0 - 100.0 fL Final  . MCH 04/02/2012 31.0  26.0 - 34.0 pg Final  . MCHC 04/02/2012 34.8  30.0 - 36.0 g/dL Final  . RDW 21/30/8657 14.0  11.5 - 15.5 % Final  . Platelets 04/02/2012 413* 150 - 400 K/uL Final  . Sodium 04/02/2012 137  135 - 145 mEq/L Final  . Potassium 04/02/2012 4.0  3.5 - 5.1 mEq/L Final  . Chloride 04/02/2012 103  96 - 112  mEq/L Final  . CO2 04/02/2012 25  19 - 32 mEq/L Final  . Glucose, Bld 04/02/2012 94  70 - 99 mg/dL Final  . BUN 16/01/9603 14  6 - 23 mg/dL Final  . Creatinine, Ser 04/02/2012 0.95  0.50 - 1.35 mg/dL Final  . Calcium 54/12/8117 9.2  8.4 - 10.5 mg/dL Final  . Total Protein 04/02/2012 7.8  6.0 - 8.3 g/dL Final  . Albumin 14/78/2956 3.9  3.5 - 5.2 g/dL Final  . AST 21/30/8657 20  0 - 37 U/L Final  . ALT 04/02/2012 18  0 - 53 U/L Final  . Alkaline Phosphatase 04/02/2012 76  39 - 117 U/L Final  . Total Bilirubin 04/02/2012 0.3  0.3 - 1.2 mg/dL Final  . GFR calc non Af Amer 04/02/2012 85* >90 mL/min Final  . GFR calc Af Amer 04/02/2012 >90  >90 mL/min Final   Comment:                                 The eGFR has been calculated                          using the CKD EPI equation.                          This calculation has not been                          validated in all clinical                          situations.                          eGFR's persistently                          <90 mL/min signify                          possible Chronic Kidney Disease.  Marland Kitchen Prothrombin Time 04/02/2012 12.5  11.6 - 15.2 seconds Final  . INR 04/02/2012 0.94  0.00 - 1.49 Final  . Color, Urine  04/02/2012 YELLOW  YELLOW Final  . APPearance 04/02/2012 CLEAR  CLEAR Final  . Specific Gravity, Urine 04/02/2012 1.018  1.005 - 1.030 Final  . pH 04/02/2012 7.5  5.0 - 8.0 Final  . Glucose, UA 04/02/2012 NEGATIVE  NEGATIVE mg/dL Final  . Hgb urine dipstick 04/02/2012 NEGATIVE  NEGATIVE Final  . Bilirubin Urine 04/02/2012 NEGATIVE  NEGATIVE Final  . Ketones, ur 04/02/2012 NEGATIVE  NEGATIVE mg/dL Final  . Protein, ur 84/69/6295 NEGATIVE  NEGATIVE mg/dL Final  . Urobilinogen, UA 04/02/2012 1.0  0.0 - 1.0 mg/dL Final  . Nitrite 28/41/3244 NEGATIVE  NEGATIVE Final  . Leukocytes, UA 04/02/2012 NEGATIVE  NEGATIVE Final   MICROSCOPIC NOT DONE ON URINES WITH NEGATIVE PROTEIN, BLOOD, LEUKOCYTES, NITRITE, OR GLUCOSE <1000 mg/dL.     X-Rays:Dg Chest 2 View  04/02/2012  *RADIOLOGY REPORT*  Clinical Data: Preop hip replacement surgery  CHEST - 2 VIEW  Comparison: 09/18/2009  Findings: Spurring in the lower of thoracic spine. Lungs clear. Heart size and pulmonary vascularity normal.  No effusion.  IMPRESSION: No acute  disease   Original Report Authenticated By: D. Andria Rhein, MD    Dg Hip Complete Right  04/02/2012  *RADIOLOGY REPORT*  Clinical Data: Preop hip replacement  RIGHT HIP - COMPLETE 2+ VIEW  Comparison: 03/20/2009  Findings: Components of left hip arthroplasty are partially seen. There is advanced narrowing of the articular cartilage in the right hip with near bone on bone apposition at its cephalad margin. There is associated subchondral sclerosis and small subchondral cysts or geodes in the femoral head and superior acetabulum.  There is spurring from the femoral head and superior margin of the acetabulum.  Negative for fracture, dislocation, or other acute bony abnormality.  Degenerative spurring noted in the visualized lumbar spine.  IMPRESSION:  1.  Advanced right hip degenerative changes as above.   Original Report Authenticated By: D. Andria Rhein, MD    Dg Pelvis Portable  04/09/2012   *RADIOLOGY REPORT*  Clinical Data: Postop right total hip  PORTABLE PELVIS  Comparison: 04/02/2012  Findings: Interval right total hip arthroplasty in satisfactory position. Associated surgical drain.  Stable left total hip arthroplasty.  No fracture or dislocation is seen.  IMPRESSION: Interval right total hip arthroplasty in satisfactory position.  Stable left total hip arthroplasty.  No fracture or dislocation is seen.   Original Report Authenticated By: Charline Bills, M.D.    Dg Hip Portable 1 View Right  04/09/2012  *RADIOLOGY REPORT*  Clinical Data: Postop right hip  PORTABLE RIGHT HIP - 1 VIEW  Comparison: Right hip radiographs dated 04/02/2012  Findings: Right total hip arthroplasty in satisfactory position.  Mild irregularity of the greater trochanter, possibly degenerative. No definite fracture is seen.  Associated surgical drain.  IMPRESSION: Right total hip arthroplasty in satisfactory position.  Mild irregularity of the greater trochanter, possibly degenerative. No definite fracture is seen.   Original Report Authenticated By: Charline Bills, M.D.     EKG: Orders placed during the hospital encounter of 04/09/12  . EKG     Hospital Course: Patient was admitted to [facility] and taken to the OR and underwent the above state procedure without complications.  Patient tolerated the procedure well and was later transferred to the recovery room and then to the orthopaedic floor for postoperative care.  They were given PO and IV analgesics for pain control following their surgery.  They were given 24 hours of postoperative antibiotics of      Anti-infectives     Start     Dose/Rate Route Frequency Ordered Stop   04/09/12 2100   ceFAZolin (ANCEF) IVPB 2 g/50 mL premix        2 g 100 mL/hr over 30 Minutes Intravenous Every 6 hours 04/09/12 1821 04/10/12 0330   04/09/12 1409   ceFAZolin (ANCEF) IVPB 2 g/50 mL premix        2 g 100 mL/hr over 30 Minutes Intravenous 60 min  pre-op 04/09/12 1409 04/09/12 1512         and started on DVT prophylaxis in the form of Xarelto.   PT and OT were ordered for total hip protocol.  The patient was allowed to be WBAT with therapy. Discharge planning was consulted to help with postop disposition and equipment needs.  Patient had a decent night on the evening of surgery and started to get up OOB with therapy on day one and walked over 100 feet.  Hemovac drain was pulled without difficulty.  The knee immobilizer was removed and discontinued.  Continued to work with therapy into  day two.  Dressing was changed on day two and the incision was healing well. Patient was seen in rounds and was ready to go home.   Discharge Medications: Prior to Admission medications   Medication Sig Start Date End Date Taking? Authorizing Provider  amLODipine (NORVASC) 10 MG tablet Take 10 mg by mouth daily.   Yes Historical Provider, MD  atorvastatin (LIPITOR) 20 MG tablet Take 20 mg by mouth daily.   Yes Historical Provider, MD  carvedilol (COREG) 25 MG tablet Take 25 mg by mouth 2 (two) times daily with a meal.   Yes Historical Provider, MD  glimepiride (AMARYL) 4 MG tablet Take 4 mg by mouth daily before breakfast.   Yes Historical Provider, MD  metFORMIN (GLUCOPHAGE) 500 MG tablet Take 500 mg by mouth 3 (three) times daily. Pt takes 1 tablet twice daily and 1/2 tablet at lunch   Yes Historical Provider, MD  valsartan-hydrochlorothiazide (DIOVAN-HCT) 320-12.5 MG per tablet Take 1 tablet by mouth every morning.   Yes Historical Provider, MD  methocarbamol (ROBAXIN) 500 MG tablet Take 1 tablet (500 mg total) by mouth every 6 (six) hours as needed. 04/11/12   Loanne Drilling, MD  oxyCODONE-acetaminophen (ROXICET) 5-325 MG per tablet Take 1-2 tablets by mouth every 4 (four) hours as needed for pain. 04/11/12   Loanne Drilling, MD  rivaroxaban (XARELTO) 10 MG TABS tablet Take 1 tablet (10 mg total) by mouth daily with breakfast. 04/11/12   Loanne Drilling, MD     Diet: Cardiac diet and Diabetic diet Activity:WBAT No bending hip over 90 degrees- A "L" Angle Do not cross legs Do not let foot roll inward When turning these patients a pillow should be placed between the patient's legs to prevent crossing. Patients should have the affected knee fully extended when trying to sit or stand from all surfaces to prevent excessive hip flexion. When ambulating and turning toward the affected side the affected leg should have the toes turned out prior to moving the walker and the rest of patient's body as to prevent internal rotation/ turning in of the leg. Abduction pillows are the most effective way to prevent a patient from not crossing legs or turning toes in at rest. If an abduction pillow is not ordered placing a regular pillow length wise between the patient's legs is also an effective reminder. It is imperative that these precautions be maintained so that the surgical hip does not dislocate. Follow-up:in 2 weeks Disposition - Home Discharged Condition: good      Medication List     As of 04/16/2012  8:43 AM    STOP taking these medications         OXYCODONE HCL PO      TAKE these medications         amLODipine 10 MG tablet   Commonly known as: NORVASC   Take 10 mg by mouth daily.      atorvastatin 20 MG tablet   Commonly known as: LIPITOR   Take 20 mg by mouth daily.      carvedilol 25 MG tablet   Commonly known as: COREG   Take 25 mg by mouth 2 (two) times daily with a meal.      glimepiride 4 MG tablet   Commonly known as: AMARYL   Take 4 mg by mouth daily before breakfast.      metFORMIN 500 MG tablet   Commonly known as: GLUCOPHAGE   Take 500 mg by mouth 3 (three)  times daily. Pt takes 1 tablet twice daily and 1/2 tablet at lunch      methocarbamol 500 MG tablet   Commonly known as: ROBAXIN   Take 1 tablet (500 mg total) by mouth every 6 (six) hours as needed.      oxyCODONE-acetaminophen 5-325 MG per tablet   Commonly  known as: PERCOCET/ROXICET   Take 1-2 tablets by mouth every 4 (four) hours as needed for pain.      rivaroxaban 10 MG Tabs tablet   Commonly known as: XARELTO   Take 1 tablet (10 mg total) by mouth daily with breakfast.      valsartan-hydrochlorothiazide 320-12.5 MG per tablet   Commonly known as: DIOVAN-HCT   Take 1 tablet by mouth every morning.        Follow-up Information    Follow up with Loanne Drilling, MD. Schedule an appointment as soon as possible for a visit on 04/24/2012. (Call [phone] tomorrow to make the appointment)    Contact information:   [address], [address] [place] [address] [phone]          Signed: Patrica Duel 04/16/2012, 8:43 AM

## 2012-12-01 ENCOUNTER — Emergency Department (HOSPITAL_COMMUNITY)
Admission: EM | Admit: 2012-12-01 | Discharge: 2012-12-02 | Disposition: A | Discharge status: Home / Self Care | Payer: Medicare PPO | Attending: Emergency Medicine | Admitting: Emergency Medicine

## 2012-12-01 DIAGNOSIS — R748 Abnormal levels of other serum enzymes: Secondary | ICD-10-CM | POA: Insufficient documentation

## 2012-12-01 DIAGNOSIS — R109 Unspecified abdominal pain: Secondary | ICD-10-CM | POA: Insufficient documentation

## 2012-12-01 DIAGNOSIS — R112 Nausea with vomiting, unspecified: Secondary | ICD-10-CM | POA: Insufficient documentation

## 2012-12-01 DIAGNOSIS — R7989 Other specified abnormal findings of blood chemistry: Secondary | ICD-10-CM

## 2012-12-01 DIAGNOSIS — Z79899 Other long term (current) drug therapy: Secondary | ICD-10-CM | POA: Insufficient documentation

## 2012-12-01 DIAGNOSIS — Z87891 Personal history of nicotine dependence: Secondary | ICD-10-CM | POA: Insufficient documentation

## 2012-12-01 DIAGNOSIS — R630 Anorexia: Secondary | ICD-10-CM | POA: Insufficient documentation

## 2012-12-01 DIAGNOSIS — R142 Eructation: Secondary | ICD-10-CM | POA: Insufficient documentation

## 2012-12-01 DIAGNOSIS — I10 Essential (primary) hypertension: Secondary | ICD-10-CM | POA: Insufficient documentation

## 2012-12-01 DIAGNOSIS — R141 Gas pain: Secondary | ICD-10-CM | POA: Insufficient documentation

## 2012-12-01 DIAGNOSIS — N200 Calculus of kidney: Secondary | ICD-10-CM

## 2012-12-01 DIAGNOSIS — E119 Type 2 diabetes mellitus without complications: Secondary | ICD-10-CM | POA: Insufficient documentation

## 2012-12-01 NOTE — ED Notes (Signed)
Pt states that he feels bloated and nausea. Feels as if he can throw up but all he does is gag. Pt reports trying to go to the bathroom and nothing will not come out. Last bowel movement was today and was normal. Pt reports an increase in belching.

## 2012-12-02 ENCOUNTER — Emergency Department (HOSPITAL_COMMUNITY): Payer: Medicare PPO

## 2012-12-02 ENCOUNTER — Encounter (HOSPITAL_COMMUNITY): Payer: Self-pay

## 2012-12-02 LAB — CBC WITH DIFFERENTIAL/PLATELET
Basophils Absolute: 0 10*3/uL (ref 0.0–0.1)
Basophils Relative: 0 % (ref 0–1)
Eosinophils Relative: 1 % (ref 0–5)
HCT: 33.5 % — ABNORMAL LOW (ref 39.0–52.0)
MCH: 31.5 pg (ref 26.0–34.0)
MCHC: 35.5 g/dL (ref 30.0–36.0)
MCV: 88.6 fL (ref 78.0–100.0)
Monocytes Absolute: 0.4 10*3/uL (ref 0.1–1.0)
Monocytes Relative: 5 % (ref 3–12)
RDW: 14.2 % (ref 11.5–15.5)

## 2012-12-02 LAB — URINALYSIS, ROUTINE W REFLEX MICROSCOPIC
Glucose, UA: >1000 mg/dL — AB
Ketones, ur: NEGATIVE mg/dL
Leukocytes, UA: NEGATIVE
Nitrite: NEGATIVE
Protein, ur: NEGATIVE mg/dL
pH: 5 (ref 5.0–8.0)

## 2012-12-02 LAB — COMPREHENSIVE METABOLIC PANEL
AST: 15 U/L (ref 0–37)
Albumin: 3.8 g/dL (ref 3.5–5.2)
BUN: 22 mg/dL (ref 6–23)
Calcium: 8.9 mg/dL (ref 8.4–10.5)
Creatinine, Ser: 1.71 mg/dL — ABNORMAL HIGH (ref 0.50–1.35)
GFR calc non Af Amer: 40 mL/min — ABNORMAL LOW (ref >90)

## 2012-12-02 LAB — LIPASE, BLOOD: Lipase: 20 U/L (ref 11–59)

## 2012-12-02 LAB — URINE MICROSCOPIC-ADD ON

## 2012-12-02 MED ORDER — ONDANSETRON HCL 4 MG/2ML IJ SOLN
4.0000 mg | Freq: Once | INTRAMUSCULAR | Status: AC
  Administered 2012-12-02: 4 mg via INTRAVENOUS
  Filled 2012-12-02: qty 2

## 2012-12-02 MED ORDER — TAMSULOSIN HCL 0.4 MG PO CAPS: 0.4000 mg | ORAL_CAPSULE | Freq: Every day | ORAL | Status: DC

## 2012-12-02 MED ORDER — SODIUM CHLORIDE 0.9 % IV SOLN
1000.0000 mL | INTRAVENOUS | Status: DC
  Administered 2012-12-02: 1000 mL via INTRAVENOUS

## 2012-12-02 MED ORDER — HYDROMORPHONE HCL PF 1 MG/ML IJ SOLN
1.0000 mg | Freq: Once | INTRAMUSCULAR | Status: AC
  Administered 2012-12-02: 1 mg via INTRAVENOUS
  Filled 2012-12-02: qty 1

## 2012-12-02 MED ORDER — HYDROCODONE-ACETAMINOPHEN 5-325 MG PO TABS: 1.0000 | ORAL_TABLET | Freq: Four times a day (QID) | ORAL | Status: DC | PRN

## 2012-12-02 MED ORDER — IOHEXOL 300 MG/ML  SOLN
50.0000 mL | Freq: Once | INTRAMUSCULAR | Status: AC | PRN
  Administered 2012-12-02: 50 mL via ORAL

## 2012-12-02 MED ORDER — FAMOTIDINE IN NACL 20-0.9 MG/50ML-% IV SOLN
20.0000 mg | Freq: Once | INTRAVENOUS | Status: AC
  Administered 2012-12-02: 20 mg via INTRAVENOUS
  Filled 2012-12-02: qty 50

## 2012-12-02 NOTE — ED Provider Notes (Signed)
CSN: [id number]     Arrival date & time 12/01/12  2337 History     First MD Initiated Contact with Patient 12/02/12 [phone]     Chief Complaint  Patient presents with  . Abdominal Pain  . Bloated   (Consider location/radiation/quality/duration/timing/severity/associated sxs/prior Treatment) HPI Patient presents with 24 hours of abdominal distention, discomfort, nausea, anorexia, postprandial emesis. Prior to 24 hours ago the patient was in his usual state of health.  Since onset symptoms were initially severe, then improved spontaneously, and again became severe after the patient attempted to eat in the hours prior to ED arrival.  He notes that that second attempt to eat had an immediate postprandial emesis. There is no concurrent fevers, chills, chest pain, dyspnea. Patient is on antibiotics for a toe infection. He has notable history of abdominal surgery for hernia repair in the distant past.   Past Medical History  Diagnosis Date  . Hypertension   . Diabetes mellitus   . Arthritis   . Myocardial infarction 1985    FOLLOWED BY DR. Shana Chute  . Shortness of breath     WITH EXERTION  . 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 Total hip arthroplasty    . Total knee arthroplasty    . Bunionectomy    . Hernia repair    . Total hip arthroplasty  04/09/2012    Procedure: TOTAL HIP ARTHROPLASTY;  Surgeon: Loanne Drilling, MD;  Location: [facility];  Service: Orthopedics;  Laterality: Right;   History reviewed. No pertinent family history. History  Substance Use Topics  . Smoking status: Former Games developer  . Smokeless tobacco: Former Neurosurgeon    Quit date: 04/02/1985  . Alcohol Use: Yes     Comment: RARE    Review of Systems  Constitutional:       Per HPI, otherwise negative  HENT:       Per HPI, otherwise negative  Respiratory:       Per HPI, otherwise negative  Cardiovascular:       Per HPI, otherwise negative   Gastrointestinal: Positive for nausea, vomiting, abdominal pain and abdominal distention.       Last bowel movement earlier this morning  Endocrine:       Negative aside from HPI  Genitourinary:       Neg aside from HPI   Musculoskeletal:       Per HPI, otherwise negative  Skin: Negative.   Neurological: Negative for syncope.    Allergies  Review of patient's allergies indicates no known allergies.  Home Medications   Current Outpatient Rx  Name  Route  Sig  Dispense  Refill  . amLODipine (NORVASC) 10 MG tablet   Oral   Take 10 mg by mouth daily.         Marland Kitchen atorvastatin (LIPITOR) 20 MG tablet   Oral   Take 20 mg by mouth daily.         . carvedilol (COREG) 25 MG tablet   Oral   Take 25 mg by mouth 2 (two) times daily with a meal.         . glimepiride (AMARYL) 4 MG tablet   Oral   Take 4 mg by mouth daily before breakfast.         . metFORMIN (GLUCOPHAGE) 500 MG tablet   Oral   Take 500 mg by mouth 3 (three) times daily. Pt takes 1 tablet twice  daily and 1/2 tablet at lunch         . methocarbamol (ROBAXIN) 500 MG tablet   Oral   Take 1 tablet (500 mg total) by mouth every 6 (six) hours as needed.   60 tablet   1   . oxyCODONE-acetaminophen (ROXICET) 5-325 MG per tablet   Oral   Take 1-2 tablets by mouth every 4 (four) hours as needed for pain.   80 tablet   0   . rivaroxaban (XARELTO) 10 MG TABS tablet   Oral   Take 1 tablet (10 mg total) by mouth daily with breakfast.   18 tablet   0   . valsartan-hydrochlorothiazide (DIOVAN-HCT) 320-12.5 MG per tablet   Oral   Take 1 tablet by mouth every morning.          BP 150/68  Pulse 61  Temp(Src) 98.8 F (37.1 C) (Oral)  Resp 20  Wt 240 lb (108.863 kg)  BMI 31.67 kg/m2  SpO2 98% Physical Exam  Nursing note and vitals reviewed. Constitutional: He is oriented to person, place, and time. He appears well-developed. No distress.  HENT:  Head: Normocephalic and atraumatic.  Eyes: Conjunctivae  and EOM are normal.  Cardiovascular: Normal rate and regular rhythm.   Pulmonary/Chest: Effort normal. No stridor. No respiratory distress.  Abdominal: He exhibits no distension. There is tenderness.  Abdomen is soft, large, with no guarding, minimal tenderness to palpation  Musculoskeletal: He exhibits no edema.  Neurological: He is alert and oriented to person, place, and time.  Skin: Skin is warm and dry.  Psychiatric: He has a normal mood and affect.    ED Course   Procedures (including critical care time)  Labs Reviewed  CBC WITH DIFFERENTIAL  COMPREHENSIVE METABOLIC PANEL  LIPASE, BLOOD  URINALYSIS, ROUTINE W REFLEX MICROSCOPIC   No results found. No diagnosis found.  Update: Patient ambulatory.  No notable vital sign changes, no new complaints.  02, 99% room air normal   MDM  Patient presents with 2 days of abdominal bloating, pain, nausea.  With his history of prior surgery, or some suspicion for SBO.  Patient's evaluation is most consistent with kidney stone, obstructive.  Per the patient's elevated creatinine, he has had similar elevations previously.  There is no ongoing evidence of concurrent infection.  Patient was discharged with analgesics, Flomax, prompt urology followup.  Gerhard Munch, MD 12/02/12 [phone]

## 2013-07-25 ENCOUNTER — Emergency Department (HOSPITAL_COMMUNITY)
Admission: EM | Admit: 2013-07-25 | Discharge: 2013-07-25 | Disposition: A | Discharge status: Home / Self Care | Payer: Medicare PPO | Attending: Emergency Medicine | Admitting: Emergency Medicine

## 2013-07-25 ENCOUNTER — Emergency Department (HOSPITAL_COMMUNITY): Payer: Medicare PPO

## 2013-07-25 ENCOUNTER — Encounter (HOSPITAL_COMMUNITY): Payer: Self-pay | Admitting: Emergency Medicine

## 2013-07-25 DIAGNOSIS — N200 Calculus of kidney: Secondary | ICD-10-CM

## 2013-07-25 DIAGNOSIS — Z87891 Personal history of nicotine dependence: Secondary | ICD-10-CM | POA: Insufficient documentation

## 2013-07-25 DIAGNOSIS — I252 Old myocardial infarction: Secondary | ICD-10-CM | POA: Insufficient documentation

## 2013-07-25 DIAGNOSIS — N189 Chronic kidney disease, unspecified: Secondary | ICD-10-CM

## 2013-07-25 DIAGNOSIS — N1330 Unspecified hydronephrosis: Secondary | ICD-10-CM

## 2013-07-25 DIAGNOSIS — I129 Hypertensive chronic kidney disease with stage 1 through stage 4 chronic kidney disease, or unspecified chronic kidney disease: Secondary | ICD-10-CM | POA: Insufficient documentation

## 2013-07-25 DIAGNOSIS — R109 Unspecified abdominal pain: Secondary | ICD-10-CM

## 2013-07-25 DIAGNOSIS — Z8669 Personal history of other diseases of the nervous system and sense organs: Secondary | ICD-10-CM | POA: Insufficient documentation

## 2013-07-25 DIAGNOSIS — Z79899 Other long term (current) drug therapy: Secondary | ICD-10-CM | POA: Insufficient documentation

## 2013-07-25 DIAGNOSIS — E119 Type 2 diabetes mellitus without complications: Secondary | ICD-10-CM | POA: Insufficient documentation

## 2013-07-25 DIAGNOSIS — Z7982 Long term (current) use of aspirin: Secondary | ICD-10-CM | POA: Insufficient documentation

## 2013-07-25 DIAGNOSIS — M129 Arthropathy, unspecified: Secondary | ICD-10-CM | POA: Insufficient documentation

## 2013-07-25 HISTORY — DX: Unspecified asthma, uncomplicated: J45.909

## 2013-07-25 LAB — CBC WITH DIFFERENTIAL/PLATELET
BASOS PCT: 0 % (ref 0–1)
Basophils Absolute: 0 10*3/uL (ref 0.0–0.1)
EOS ABS: 0 10*3/uL (ref 0.0–0.7)
EOS PCT: 0 % (ref 0–5)
HEMATOCRIT: 34.1 % — AB (ref 39.0–52.0)
HEMOGLOBIN: 12.1 g/dL — AB (ref 13.0–17.0)
LYMPHS ABS: 0.9 10*3/uL (ref 0.7–4.0)
Lymphocytes Relative: 12 % (ref 12–46)
MCH: 31.3 pg (ref 26.0–34.0)
MCHC: 35.5 g/dL (ref 30.0–36.0)
MCV: 88.1 fL (ref 78.0–100.0)
MONO ABS: 0.8 10*3/uL (ref 0.1–1.0)
MONOS PCT: 11 % (ref 3–12)
Neutro Abs: 5.7 10*3/uL (ref 1.7–7.7)
Neutrophils Relative %: 77 % (ref 43–77)
Platelets: 351 10*3/uL (ref 150–400)
RBC: 3.87 MIL/uL — ABNORMAL LOW (ref 4.22–5.81)
RDW: 14.1 % (ref 11.5–15.5)
WBC: 7.4 10*3/uL (ref 4.0–10.5)

## 2013-07-25 LAB — URINALYSIS, ROUTINE W REFLEX MICROSCOPIC
BILIRUBIN URINE: NEGATIVE
Glucose, UA: >1000 mg/dL — AB
KETONES UR: NEGATIVE mg/dL
Leukocytes, UA: NEGATIVE
Nitrite: NEGATIVE
PH: 5 (ref 5.0–8.0)
Protein, ur: NEGATIVE mg/dL
SPECIFIC GRAVITY, URINE: 1.022 (ref 1.005–1.030)
Urobilinogen, UA: 0.2 mg/dL (ref 0.0–1.0)

## 2013-07-25 LAB — LIPASE, BLOOD: LIPASE: 21 U/L (ref 11–59)

## 2013-07-25 LAB — COMPREHENSIVE METABOLIC PANEL
ALBUMIN: 3.5 g/dL (ref 3.5–5.2)
ALT: 12 U/L (ref 0–53)
AST: 13 U/L (ref 0–37)
Alkaline Phosphatase: 74 U/L (ref 39–117)
BUN: 28 mg/dL — AB (ref 6–23)
CO2: 22 mEq/L (ref 19–32)
CREATININE: 1.73 mg/dL — AB (ref 0.50–1.35)
Calcium: 9 mg/dL (ref 8.4–10.5)
Chloride: 98 mEq/L (ref 96–112)
GFR calc non Af Amer: 39 mL/min — ABNORMAL LOW (ref >90)
GFR, EST AFRICAN AMERICAN: 45 mL/min — AB (ref >90)
GLUCOSE: 142 mg/dL — AB (ref 70–99)
Potassium: 3.8 mEq/L (ref 3.7–5.3)
Sodium: 134 mEq/L — ABNORMAL LOW (ref 137–147)
TOTAL PROTEIN: 7.5 g/dL (ref 6.0–8.3)
Total Bilirubin: 0.4 mg/dL (ref 0.3–1.2)

## 2013-07-25 LAB — URINE MICROSCOPIC-ADD ON

## 2013-07-25 LAB — I-STAT CG4 LACTIC ACID, ED: LACTIC ACID, VENOUS: 2.03 mmol/L (ref 0.5–2.2)

## 2013-07-25 MED ORDER — HYDROMORPHONE HCL PF 1 MG/ML IJ SOLN
1.0000 mg | Freq: Once | INTRAMUSCULAR | Status: AC
Start: 2013-07-25 — End: 2013-07-25
  Administered 2013-07-25: 1 mg via INTRAVENOUS
  Filled 2013-07-25: qty 1

## 2013-07-25 MED ORDER — ONDANSETRON HCL 4 MG/2ML IJ SOLN
4.0000 mg | Freq: Once | INTRAMUSCULAR | Status: AC
  Administered 2013-07-25: 4 mg via INTRAVENOUS
  Filled 2013-07-25: qty 2

## 2013-07-25 MED ORDER — SODIUM CHLORIDE 0.9 % IV BOLUS (SEPSIS)
500.0000 mL | Freq: Once | INTRAVENOUS | Status: AC
  Administered 2013-07-25: 500 mL via INTRAVENOUS

## 2013-07-25 MED ORDER — IOHEXOL 300 MG/ML  SOLN
100.0000 mL | Freq: Once | INTRAMUSCULAR | Status: AC | PRN
  Administered 2013-07-25: 100 mL via INTRAVENOUS

## 2013-07-25 MED ORDER — HYDROCODONE-ACETAMINOPHEN 5-325 MG PO TABS: 2.0000 | ORAL_TABLET | ORAL | Status: DC | PRN

## 2013-07-25 MED ORDER — ONDANSETRON 4 MG PO TBDP: ORAL_TABLET | ORAL | Status: DC

## 2013-07-25 MED ORDER — HYDROCODONE-ACETAMINOPHEN 5-325 MG PO TABS
2.0000 | ORAL_TABLET | Freq: Once | ORAL | Status: AC
Start: 2013-07-25 — End: 2013-07-25
  Administered 2013-07-25: 2 via ORAL
  Filled 2013-07-25: qty 2

## 2013-07-25 MED ORDER — IOHEXOL 300 MG/ML  SOLN
50.0000 mL | Freq: Once | INTRAMUSCULAR | Status: AC | PRN
  Administered 2013-07-25: 50 mL via ORAL

## 2013-07-25 MED ORDER — FENTANYL CITRATE 0.05 MG/ML IJ SOLN
50.0000 ug | INTRAMUSCULAR | Status: DC | PRN
  Administered 2013-07-25 (×2): 50 ug via INTRAVENOUS
  Filled 2013-07-25 (×2): qty 2

## 2013-07-25 NOTE — Discharge Instructions (Signed)
For severe pain take norco or vicodin however realize they have the potential for addiction and it can make you sleepy and has tylenol in it.  No operating machinery while taking. If you were given medicines take as directed.  If you are on coumadin or contraceptives realize their levels and effectiveness is altered by many different medicines.  If you have any reaction (rash, tongues swelling, other) to the medicines stop taking and see a physician.   Please follow up as directed and return to the ER or see a physician for new or worsening symptoms.  Thank you. Filed Vitals:   07/25/13 0759 07/25/13 1040 07/25/13 1145  BP: 146/64 152/65   Pulse: 81 61 59  Temp: 98 F (36.7 C) 98.4 F (36.9 C)   TempSrc: Oral Oral   Resp: 20 16   SpO2: 96% 97% 93%

## 2013-07-25 NOTE — ED Notes (Signed)
Pt reports intermittent abdominal pain for 2 days with vomiting up until yesterday. Pt denies diarrhea. Pt denies dysuria or other symptoms. Pt reports last BM on Monday. Bowel sounds audible and pt able to pass gas. Pt denies pain at present time.

## 2013-07-25 NOTE — ED Notes (Addendum)
Pt c/o of intermittent abd pain x 2 days. States he has vomited last episode yesterday afternoon, states he took milk of magnesia and that stopped the vomiting. Denies pain at the moment.

## 2013-07-25 NOTE — ED Notes (Signed)
Pt transported to CT ?

## 2013-07-25 NOTE — ED Notes (Signed)
Pt returned for CT. Pt reports severe nausea and pain in LLQ 4/10. Zavitz MD notified.

## 2013-07-25 NOTE — ED Provider Notes (Signed)
CSN: [id number]     Arrival date & time 07/25/13  5361 History   First MD Initiated Contact with Patient 07/25/13 [phone]     Chief Complaint  Patient presents with  . Abdominal Pain     (Consider location/radiation/quality/duration/timing/severity/associated sxs/prior Treatment) HPI Comments: 68 year old male with anemia, arthritis, gallstone history presents with recurrent abdominal pain.  Pain is intermittent, sharp an ache sensation, initially started after eating cake Tuesday.  Patient has had nausea and did not eat anything yesterday.  Patient feels slightly similar to gallstone history however pain is located bilateral lower abdomen.  No atrial fibrillation history. No GI bleeding or fevers.  No abdominal surgery history. Pain improves with time. Patient has not passed gas recently. In denies history of bowel obstruction.  Patient is a 68 y.o. male presenting with abdominal pain. The history is provided by the patient.  Abdominal Pain Associated symptoms: nausea and vomiting   Associated symptoms: no chest pain, no chills, no dysuria, no fever and no shortness of breath     Past Medical History  Diagnosis Date  . Hypertension   . Diabetes mellitus   . Arthritis   . Myocardial infarction 1985    FOLLOWED BY DR. Montez Morita  . Shortness of breath     WITH EXERTION  . 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 Total hip arthroplasty    . Total knee arthroplasty    . Bunionectomy    . Hernia repair    . Total hip arthroplasty  04/09/2012    Procedure: TOTAL HIP ARTHROPLASTY;  Surgeon: [name], MD;  Location: [facility];  Service: Orthopedics;  Laterality: Right;   No family history on file. History  Substance Use Topics  . Smoking status: Former Research scientist (life sciences)  . Smokeless tobacco: Former Systems developer    Quit date: 04/02/1985  . Alcohol Use: Yes     Comment: RARE    Review of Systems  Constitutional: Positive for appetite  change. Negative for fever and chills.  HENT: Negative for congestion.   Eyes: Negative for visual disturbance.  Respiratory: Negative for shortness of breath.   Cardiovascular: Negative for chest pain.  Gastrointestinal: Positive for nausea, vomiting and abdominal pain. Negative for blood in stool.  Genitourinary: Negative for dysuria and flank pain.  Musculoskeletal: Positive for back pain. Negative for neck pain and neck stiffness.  Skin: Negative for rash.  Neurological: Negative for light-headedness and headaches.      Allergies  Review of patient's allergies indicates no known allergies.  Home Medications   Prior to Admission medications   Medication Sig Start Date End Date Taking? Authorizing Provider  amLODipine (NORVASC) 10 MG tablet Take 10 mg by mouth daily.    Historical Provider, MD  aspirin 325 MG tablet Take 325 mg by mouth daily.    Historical Provider, MD  atorvastatin (LIPITOR) 20 MG tablet Take 20 mg by mouth daily.    Historical Provider, MD  carvedilol (COREG) 25 MG tablet Take 25 mg by mouth 2 (two) times daily with a meal.    Historical Provider, MD  glimepiride (AMARYL) 4 MG tablet Take 4 mg by mouth daily before breakfast.    Historical Provider, MD  HYDROcodone-acetaminophen (NORCO/VICODIN) 5-325 MG per tablet Take 1 tablet by mouth every 6 (six) hours as needed for pain. 12/02/12   Carmin Muskrat, MD  metFORMIN (GLUCOPHAGE) 500 MG tablet Take 500 mg by mouth 3 (three)  times daily. Pt takes 1 tablet twice daily and 1/2 tablet at lunch    Historical Provider, MD  tamsulosin (FLOMAX) 0.4 MG CAPS capsule Take 1 capsule (0.4 mg total) by mouth daily. 12/02/12   Carmin Muskrat, MD  valsartan-hydrochlorothiazide (DIOVAN-HCT) 320-12.5 MG per tablet Take 1 tablet by mouth every morning.    Historical Provider, MD   BP 146/64  Pulse 81  Temp(Src) 98 F (36.7 C) (Oral)  Resp 20  SpO2 96% Physical Exam  Nursing note and vitals reviewed. Constitutional: He is  oriented to person, place, and time. He appears well-developed and well-nourished.  HENT:  Head: Normocephalic and atraumatic.  Mild dry mm  Eyes: Conjunctivae are normal. Right eye exhibits no discharge. Left eye exhibits no discharge.  Neck: Normal range of motion. Neck supple. No tracheal deviation present.  Cardiovascular: Normal rate and regular rhythm.   Pulmonary/Chest: Effort normal and breath sounds normal.  Abdominal: Soft. He exhibits distension (tympanic BS). There is tenderness (mild lower bilateral). There is no guarding.  Musculoskeletal: He exhibits no edema.  Neurological: He is alert and oriented to person, place, and time.  Skin: Skin is warm. No rash noted.  Psychiatric: He has a normal mood and affect.    ED Course  Procedures (including critical care time)  EMERGENCY DEPARTMENT BILIARY ULTRASOUND INTERPRETATION "Study: Limited Abdominal Ultrasound of the gallbladder and common bile duct."  INDICATIONS: Abdominal pain, Nausea and Back pain Indication: Multiple views of the gallbladder and common bile duct were obtained in real-time with a Multi-frequency probe." PERFORMED BY:  Myself IMAGES ARCHIVED?: Yes FINDINGS: Gallstones absent, Gallbladder wall normal in thickness and Sonographic Murphy's sign absent LIMITATIONS: Body Habitus and Bowel Gas INTERPRETATION: Normal   Labs Review Labs Reviewed  CBC WITH DIFFERENTIAL - Abnormal; Notable for the following:    RBC 3.87 (*)    Hemoglobin 12.1 (*)    HCT 34.1 (*)    All other components within normal limits  COMPREHENSIVE METABOLIC PANEL - Abnormal; Notable for the following:    Sodium 134 (*)    Glucose, Bld 142 (*)    BUN 28 (*)    Creatinine, Ser 1.73 (*)    GFR calc non Af Amer 39 (*)    GFR calc Af Amer 45 (*)    All other components within normal limits  URINALYSIS, ROUTINE W REFLEX MICROSCOPIC - Abnormal; Notable for the following:    Glucose, UA >1000 (*)    Hgb urine dipstick LARGE (*)    All  other components within normal limits  URINE MICROSCOPIC-ADD ON - Abnormal; Notable for the following:    Crystals CA OXALATE CRYSTALS (*)    All other components within normal limits  LIPASE, BLOOD  I-STAT CG4 LACTIC ACID, ED    Imaging Review Ct Abdomen Pelvis W Contrast  07/25/2013   CLINICAL DATA:  Left lower quadrant pain.  EXAM: CT ABDOMEN AND PELVIS WITH CONTRAST  TECHNIQUE: Multidetector CT imaging of the abdomen and pelvis was performed using the standard protocol following bolus administration of intravenous contrast.  CONTRAST:  168mL OMNIPAQUE IOHEXOL 300 MG/ML  SOLN  COMPARISON:  12/02/2012  FINDINGS: Bilateral hip prostheses obscure visualization of the inferior pelvis.  There is mild left hydronephrosis with left perinephric stranding. The left ureter is mildly dilated proximally to its midportion. Near the aortic bifurcation, there is inflammatory type change which lies along the anterior margins of the common iliac arteries, more on the left, and continues along the left pelvic sidewall  and anterior to the left external iliac vessels. Small densities are noted in the lower pole of the left kidney consistent with nonobstructing stones. These were present previously, and are without significant change. No other intrarenal stones. No right intrarenal stones. No right hydronephrosis. No renal masses. Enhancement of and excretion from the left kidney is delayed compared to the right. Left hydronephrosis is similar to the prior exam as is left perinephric stranding. Inflammatory type change adjacent to the mid to distal left ureter at iliac vessels is new.  There is no ureteral stone that the distal ureter is not well seen due to the hip prosthesis artifact. Bladder is also partly obscured. No bladder mass or stone is seen.  Clear lung bases.  Heart is normal in size.  Normal liver. There is a low-density area in the spleen measuring 2 cm. This is without significant change. This is likely a  cyst or hemangioma. Spleen otherwise unremarkable. Normal gallbladder. Normal pancreas. No bile duct dilation. No adrenal masses. No pathologically enlarged lymph nodes. No abnormal fluid collections. Bowel is unremarkable. A normal appendix is visualized.  Bilateral hip prostheses are partly imaged. There are well-seated and aligned. There are degenerative changes throughout the visualized spine.  IMPRESSION: 1. Left hydronephrosis and hydroureter. No ureteral stone is seen. The degree of obstruction is similar to what was present on the prior exam. There is new inflammatory change along the lower retroperitoneum, which is likely causing the partial obstruction. This could reflect retroperitoneal fibrosis. 2. Two small nonobstructing stones in the lower pole of the left kidney, relatively stable from the prior study. 3. No other acute abnormality. 4. Normal appendix visualized.   Electronically Signed   By: Lajean Manes M.D.   On: 07/25/2013 10:01     EKG Interpretation None      MDM   Final diagnoses:  Left nephrolithiasis  Left flank pain  CRF (chronic renal failure)  Hydronephrosis, left   With age and worsening abdominal pain broad differential including bowel obstruction, diverticulitis, vascular related, gallstones, kidney stones, other. Bedside ultrasound done to assess diameter of abdominal aorta however minimal views to to body habitus and abdominal distention.  I was able to view the gallbladder and no gallbladder wall thickening or obvious gallstone seen. Plan for labs, pain meds, fluids and CT abdomen for further delineation of pain.  CT scan reviewed showing left hydronephrosis and hydroureter with no kidney stone in the ureter. Mild nonspecific inflammation. Pain improved in the ER. Plan for close followup with urology and primary Dr.  Results and differential diagnosis were discussed with the patient. Close follow up outpatient was discussed, patient comfortable with the  plan.   Filed Vitals:   07/25/13 0759 07/25/13 1040  BP: 146/64 152/65  Pulse: 81 61  Temp: 98 F (36.7 C) 98.4 F (36.9 C)  TempSrc: Oral Oral  Resp: 20 16  SpO2: 96% 97%         Mariea Clonts, MD 07/25/13 1232

## 2013-07-26 LAB — URINE CULTURE
COLONY COUNT: NO GROWTH
Culture: NO GROWTH

## 2014-02-27 ENCOUNTER — Other Ambulatory Visit: Payer: Self-pay | Admitting: Urology

## 2014-03-05 ENCOUNTER — Encounter (HOSPITAL_BASED_OUTPATIENT_CLINIC_OR_DEPARTMENT_OTHER): Payer: Self-pay | Admitting: *Deleted

## 2014-03-10 ENCOUNTER — Encounter (HOSPITAL_BASED_OUTPATIENT_CLINIC_OR_DEPARTMENT_OTHER): Payer: Self-pay | Admitting: *Deleted

## 2014-03-10 NOTE — Progress Notes (Signed)
Tried unsuccessfully to obtain most recent ekg, last office note from cardiologist.  The office is closed .  Will try tomorrow.

## 2014-03-10 NOTE — Progress Notes (Signed)
Pt instructed npo p mn 12/3 x norvasc, and coreg w sip of water. To [facility] 12/4 @ 1045.  Needs istat, ekg on arrival.  cxr in chart. Marland Kitchen

## 2014-03-14 ENCOUNTER — Other Ambulatory Visit: Payer: Self-pay

## 2014-03-14 ENCOUNTER — Ambulatory Visit (HOSPITAL_BASED_OUTPATIENT_CLINIC_OR_DEPARTMENT_OTHER): Payer: Medicare PPO | Admitting: Anesthesiology

## 2014-03-14 ENCOUNTER — Encounter (HOSPITAL_BASED_OUTPATIENT_CLINIC_OR_DEPARTMENT_OTHER)
Admission: RE | Disposition: A | Discharge status: Home / Self Care | Payer: Self-pay | Source: Ambulatory Visit | Attending: Urology

## 2014-03-14 ENCOUNTER — Encounter (HOSPITAL_BASED_OUTPATIENT_CLINIC_OR_DEPARTMENT_OTHER): Payer: Self-pay | Admitting: *Deleted

## 2014-03-14 ENCOUNTER — Ambulatory Visit (HOSPITAL_BASED_OUTPATIENT_CLINIC_OR_DEPARTMENT_OTHER)
Admission: RE | Admit: 2014-03-14 | Discharge: 2014-03-14 | Disposition: A | Discharge status: Home / Self Care | Payer: Medicare PPO | Source: Ambulatory Visit | Attending: Urology | Admitting: Urology

## 2014-03-14 DIAGNOSIS — F1590 Other stimulant use, unspecified, uncomplicated: Secondary | ICD-10-CM | POA: Diagnosis not present

## 2014-03-14 DIAGNOSIS — E119 Type 2 diabetes mellitus without complications: Secondary | ICD-10-CM | POA: Insufficient documentation

## 2014-03-14 DIAGNOSIS — M1990 Unspecified osteoarthritis, unspecified site: Secondary | ICD-10-CM | POA: Insufficient documentation

## 2014-03-14 DIAGNOSIS — Z809 Family history of malignant neoplasm, unspecified: Secondary | ICD-10-CM | POA: Diagnosis not present

## 2014-03-14 DIAGNOSIS — I10 Essential (primary) hypertension: Secondary | ICD-10-CM | POA: Diagnosis not present

## 2014-03-14 DIAGNOSIS — Z91013 Allergy to seafood: Secondary | ICD-10-CM | POA: Insufficient documentation

## 2014-03-14 DIAGNOSIS — N529 Male erectile dysfunction, unspecified: Secondary | ICD-10-CM | POA: Diagnosis not present

## 2014-03-14 DIAGNOSIS — J45909 Unspecified asthma, uncomplicated: Secondary | ICD-10-CM | POA: Insufficient documentation

## 2014-03-14 DIAGNOSIS — F1099 Alcohol use, unspecified with unspecified alcohol-induced disorder: Secondary | ICD-10-CM | POA: Diagnosis not present

## 2014-03-14 DIAGNOSIS — Z96659 Presence of unspecified artificial knee joint: Secondary | ICD-10-CM | POA: Insufficient documentation

## 2014-03-14 DIAGNOSIS — K219 Gastro-esophageal reflux disease without esophagitis: Secondary | ICD-10-CM | POA: Insufficient documentation

## 2014-03-14 DIAGNOSIS — N132 Hydronephrosis with renal and ureteral calculous obstruction: Secondary | ICD-10-CM | POA: Diagnosis present

## 2014-03-14 DIAGNOSIS — Z8249 Family history of ischemic heart disease and other diseases of the circulatory system: Secondary | ICD-10-CM | POA: Diagnosis not present

## 2014-03-14 DIAGNOSIS — E669 Obesity, unspecified: Secondary | ICD-10-CM | POA: Diagnosis not present

## 2014-03-14 DIAGNOSIS — Z87891 Personal history of nicotine dependence: Secondary | ICD-10-CM | POA: Diagnosis not present

## 2014-03-14 DIAGNOSIS — Z6831 Body mass index (BMI) 31.0-31.9, adult: Secondary | ICD-10-CM | POA: Insufficient documentation

## 2014-03-14 DIAGNOSIS — Z8709 Personal history of other diseases of the respiratory system: Secondary | ICD-10-CM | POA: Insufficient documentation

## 2014-03-14 DIAGNOSIS — Z794 Long term (current) use of insulin: Secondary | ICD-10-CM | POA: Diagnosis not present

## 2014-03-14 DIAGNOSIS — Z8639 Personal history of other endocrine, nutritional and metabolic disease: Secondary | ICD-10-CM | POA: Diagnosis not present

## 2014-03-14 DIAGNOSIS — Z8679 Personal history of other diseases of the circulatory system: Secondary | ICD-10-CM | POA: Diagnosis not present

## 2014-03-14 DIAGNOSIS — I252 Old myocardial infarction: Secondary | ICD-10-CM | POA: Diagnosis not present

## 2014-03-14 DIAGNOSIS — Z96649 Presence of unspecified artificial hip joint: Secondary | ICD-10-CM | POA: Insufficient documentation

## 2014-03-14 DIAGNOSIS — E291 Testicular hypofunction: Secondary | ICD-10-CM | POA: Diagnosis not present

## 2014-03-14 HISTORY — DX: Atherosclerotic heart disease of native coronary artery without angina pectoris: I25.10

## 2014-03-14 HISTORY — DX: Unspecified hydronephrosis: N13.30

## 2014-03-14 HISTORY — DX: Presence of tooth-root and mandibular implants: Z96.5

## 2014-03-14 HISTORY — DX: Old myocardial infarction: I25.2

## 2014-03-14 HISTORY — DX: Calculus of kidney: N20.0

## 2014-03-14 HISTORY — DX: Gastro-esophageal reflux disease without esophagitis: K21.9

## 2014-03-14 HISTORY — PX: CYSTOSCOPY W/ RETROGRADES: SHX1426

## 2014-03-14 HISTORY — DX: Type 2 diabetes mellitus without complications: E11.9

## 2014-03-14 LAB — GLUCOSE, CAPILLARY: Glucose-Capillary: 87 mg/dL (ref 70–99)

## 2014-03-14 SURGERY — CYSTOSCOPY, WITH RETROGRADE PYELOGRAM
Anesthesia: General | Site: Ureter | Laterality: Left

## 2014-03-14 MED ORDER — MIDAZOLAM HCL 5 MG/5ML IJ SOLN
INTRAMUSCULAR | Status: DC | PRN
  Administered 2014-03-14: 2 mg via INTRAVENOUS

## 2014-03-14 MED ORDER — PROPOFOL INFUSION 10 MG/ML OPTIME
INTRAVENOUS | Status: DC | PRN
  Administered 2014-03-14: 200 mL via INTRAVENOUS

## 2014-03-14 MED ORDER — IOHEXOL 350 MG/ML SOLN
INTRAVENOUS | Status: DC | PRN
  Administered 2014-03-14: 28 mL

## 2014-03-14 MED ORDER — LIDOCAINE HCL (CARDIAC) 20 MG/ML IV SOLN
INTRAVENOUS | Status: DC | PRN
  Administered 2014-03-14: 80 mg via INTRAVENOUS

## 2014-03-14 MED ORDER — FENTANYL CITRATE 0.05 MG/ML IJ SOLN
INTRAMUSCULAR | Status: DC | PRN
  Administered 2014-03-14: 50 ug via INTRAVENOUS

## 2014-03-14 MED ORDER — FENTANYL CITRATE 0.05 MG/ML IJ SOLN
25.0000 ug | INTRAMUSCULAR | Status: DC | PRN
  Filled 2014-03-14: qty 1

## 2014-03-14 MED ORDER — ONDANSETRON HCL 4 MG/2ML IJ SOLN
INTRAMUSCULAR | Status: DC | PRN
Start: 2014-03-14 — End: 2014-03-14
  Administered 2014-03-14: 4 mg via INTRAVENOUS

## 2014-03-14 MED ORDER — PHENYLEPHRINE HCL 10 MG/ML IJ SOLN
INTRAMUSCULAR | Status: DC | PRN
  Administered 2014-03-14: 80 ug via INTRAVENOUS

## 2014-03-14 MED ORDER — MIDAZOLAM HCL 2 MG/2ML IJ SOLN
INTRAMUSCULAR | Status: AC
  Filled 2014-03-14: qty 2

## 2014-03-14 MED ORDER — LACTATED RINGERS IV SOLN
INTRAVENOUS | Status: DC
  Administered 2014-03-14 (×2): via INTRAVENOUS
  Filled 2014-03-14: qty 1000

## 2014-03-14 MED ORDER — DEXAMETHASONE SODIUM PHOSPHATE 10 MG/ML IJ SOLN
INTRAMUSCULAR | Status: DC | PRN
  Administered 2014-03-14: 10 mg via INTRAVENOUS

## 2014-03-14 MED ORDER — FENTANYL CITRATE 0.05 MG/ML IJ SOLN
INTRAMUSCULAR | Status: AC
  Filled 2014-03-14: qty 4

## 2014-03-14 MED ORDER — PROMETHAZINE HCL 25 MG/ML IJ SOLN
6.2500 mg | INTRAMUSCULAR | Status: DC | PRN
  Filled 2014-03-14: qty 1

## 2014-03-14 MED ORDER — CEFAZOLIN SODIUM-DEXTROSE 2-3 GM-% IV SOLR
2.0000 g | INTRAVENOUS | Status: AC
  Administered 2014-03-14: 2 g via INTRAVENOUS
  Filled 2014-03-14: qty 50

## 2014-03-14 MED ORDER — CEFAZOLIN SODIUM 1-5 GM-% IV SOLN
1.0000 g | INTRAVENOUS | Status: DC
  Filled 2014-03-14: qty 50

## 2014-03-14 MED ORDER — SODIUM CHLORIDE 0.9 % IR SOLN
Status: DC | PRN
  Administered 2014-03-14: 3000 mL via INTRAVESICAL

## 2014-03-14 MED ORDER — CEFAZOLIN SODIUM-DEXTROSE 2-3 GM-% IV SOLR
INTRAVENOUS | Status: AC
  Filled 2014-03-14: qty 50

## 2014-03-14 SURGICAL SUPPLY — 18 items
BAG DRAIN URO-CYSTO SKYTR STRL (DRAIN) ×3 IMPLANT
CANISTER SUCT LVC 12 LTR MEDI- (MISCELLANEOUS) ×3 IMPLANT
CATH INTERMIT  6FR 70CM (CATHETERS) ×3 IMPLANT
CATH URET 5FR 28IN CONE TIP (BALLOONS) ×2
CATH URET 5FR 70CM CONE TIP (BALLOONS) ×1 IMPLANT
CLOTH BEACON ORANGE TIMEOUT ST (SAFETY) ×3 IMPLANT
DRAPE CAMERA CLOSED 9X96 (DRAPES) ×3 IMPLANT
GLOVE BIO SURGEON STRL SZ7 (GLOVE) ×3 IMPLANT
GLOVE BIOGEL PI IND STRL 6.5 (GLOVE) ×2 IMPLANT
GLOVE BIOGEL PI INDICATOR 6.5 (GLOVE) ×4
GOWN STRL REUS W/TWL LRG LVL3 (GOWN DISPOSABLE) ×6 IMPLANT
GOWN XL W/COTTON TOWEL STD (GOWNS) ×3 IMPLANT
GUIDEWIRE ANG ZIPWIRE 038X150 (WIRE) ×3 IMPLANT
GUIDEWIRE STR DUAL SENSOR (WIRE) ×3 IMPLANT
NS IRRIG 500ML POUR BTL (IV SOLUTION) ×3 IMPLANT
PACK CYSTO (CUSTOM PROCEDURE TRAY) ×3 IMPLANT
STENT URET 6FRX24 CONTOUR (STENTS) ×3 IMPLANT
SYRINGE 10CC LL (SYRINGE) ×3 IMPLANT

## 2014-03-14 NOTE — Discharge Instructions (Signed)

## 2014-03-14 NOTE — Anesthesia Preprocedure Evaluation (Addendum)
Anesthesia Evaluation  Patient identified by MRN, date of birth, ID band Patient awake    Reviewed: Allergy & Precautions, NPO status , Patient's Chart, lab work & pertinent test results  Airway Mallampati: II  TM Distance: >3 FB Neck ROM: Full    Dental no notable dental hx.    Pulmonary shortness of breath, asthma , former smoker,  breath sounds clear to auscultation  Pulmonary exam normal       Cardiovascular Exercise Tolerance: Good hypertension, Pt. on medications and Pt. on home beta blockers + CAD and + Past MI Rhythm:Regular Rate:Normal  Saw Dr. Montez Morita 3 months ago. No current cardiopulmonary symptoms.   Neuro/Psych  Neuromuscular disease negative psych ROS   GI/Hepatic Neg liver ROS, GERD-  ,  Endo/Other  diabetes, Type 2, Oral Hypoglycemic Agents  Renal/GU Renal InsufficiencyRenal disease  negative genitourinary   Musculoskeletal  (+) Arthritis -,   Abdominal (+) + obese,   Peds negative pediatric ROS (+)  Hematology  (+) anemia ,   Anesthesia Other Findings   Reproductive/Obstetrics negative OB ROS                            Anesthesia Physical Anesthesia Plan  ASA: III  Anesthesia Plan: General   Post-op Pain Management:    Induction: Intravenous  Airway Management Planned: LMA  Additional Equipment:   Intra-op Plan:   Post-operative Plan: Extubation in OR  Informed Consent: I have reviewed the patients History and Physical, chart, labs and discussed the procedure including the risks, benefits and alternatives for the proposed anesthesia with the patient or authorized representative who has indicated his/her understanding and acceptance.   Dental advisory given  Plan Discussed with: CRNA  Anesthesia Plan Comments:         Anesthesia Quick Evaluation

## 2014-03-14 NOTE — Transfer of Care (Signed)
Immediate Anesthesia Transfer of Care Note  Patient: [name]  Procedure(s) Performed: Procedure(s): CYSTOSCOPY WITH LEFT RETROGRADE PYELOGRAM, Left Ureteroscopy, Lweft ureteral Stent No string (Left)  Patient Location: PACU  Anesthesia Type:General  Level of Consciousness: awake, alert , oriented and patient cooperative  Airway & Oxygen Therapy: Patient Spontanous Breathing and Patient connected to nasal cannula oxygen  Post-op Assessment: Report given to PACU RN and Post -op Vital signs reviewed and stable  Post vital signs: Reviewed and stable  Complications: No apparent anesthesia complications

## 2014-03-14 NOTE — H&P (Signed)
History of Present Illness     [name] returns for follow-up. He has non obstructing left renal calculi, moderate left hydronephrosis. Those findings were also present on the previous CT of August 2014 and are unchanged. There are some inflammatory changes of the mid and distal  left 1  ureter that could represent retroperitoneal fibrosis. There are no changes in his bowel habits. Repeat serum testosterone was 321 in May. FSH, LH, Prolactin levels are normal. His PSA was 3.19 in April.    Past Medical History Problems  1. History of Arthritis 2. History of asthma (Z87.09) 3. History of cardiac disorder (Z86.79) 4. History of cardiac murmur (Z86.79) 5. History of diabetes mellitus (Z86.39) 6. History of hypertension (Z86.79) 7. History of myocardial infarction (I25.2)  Surgical History Problems  1. History of Foot Surgery 2. History of Inguinal Hernia Repair 3. History of Knee Replacement 4. History of Total Hip Replacement  Current Meds 1. AmLODIPine Besylate 10 MG Oral Tablet;  Therapy: (Recorded:29May2015) to Recorded 2. Aspirin 325 MG Oral Tablet;  Therapy: (Recorded:10Jul2015) to Recorded 3. Atorvastatin Calcium 20 MG Oral Tablet;  Therapy: (Recorded:29May2015) to Recorded 4. Carvedilol 25 MG Oral Tablet;  Therapy: (Recorded:29May2015) to Recorded 5. Diovan HCT 320-12.5 MG Oral Tablet;  Therapy: (Recorded:29May2015) to Recorded 6. Glimepiride 4 MG Oral Tablet;  Therapy: (Recorded:29May2015) to Recorded 7. MetFORMIN HCl - 500 MG Oral Tablet;  Therapy: (Recorded:29May2015) to Recorded  Allergies Medication  1. No Known Drug Allergies  Family History Problems  1. Family history of Cancer : Mother 2. Family history of Congestive Heart Failure : Father 3. Family history of Death In The Family Child Daughter : Daughter   MS 4. Family history of Death In The Family Father : Father   CHFAge 23 5. Family history of Death In The Family Mother : Mother   CancerWas  in her 42's 6. Family history of Family Health Status Number Of Children   1 son4 daughters ( one deceased ) 30. Family history of Multiple Sclerosis : Daughter  Social History Problems  1. Alcohol Use 2. Caffeine Use   3 per day 3. Former smoker [phone]) 4. Four children 5. Marital History - Widowed 6. Retired From Work 7. Tobacco Use   Dips snuff  Review of Systems Genitourinary, constitutional, skin, eye, otolaryngeal, hematologic/lymphatic, cardiovascular, pulmonary, endocrine, musculoskeletal, gastrointestinal, neurological and psychiatric system(s) were reviewed and pertinent findings if present are noted and are otherwise negative.  Genitourinary: nocturia and erectile dysfunction.    Vitals Vital Signs [Data Includes: Last 1 Day]  Recorded: [id number] 03:09PM  Weight: 243 lb  BMI Calculated: 31.2 BSA Calculated: 2.36 Blood Pressure: 162 / 71 Heart Rate: 76 Respiration: 18  Physical Exam Constitutional: Well nourished and well developed . No acute distress.  ENT:. The ears and nose are normal in appearance.  Neck: The appearance of the neck is normal and no neck mass is present.  Pulmonary: No respiratory distress and normal respiratory rhythm and effort.  Cardiovascular: Heart rate and rhythm are normal . No peripheral edema.  Abdomen: The abdomen is soft and nontender. No masses are palpated. No CVA tenderness. No hernias are palpable. No hepatosplenomegaly noted.  Rectal: Rectal exam demonstrates normal sphincter tone, no tenderness and no masses. Estimated prostate size is 2+. The prostate has no nodularity and is not tender. The left seminal vesicle is nonpalpable. The right seminal vesicle is nonpalpable. The perineum is normal on inspection.  Genitourinary: Examination of the penis demonstrates no discharge, no masses,  no lesions and a normal meatus. The scrotum is without lesions. The right epididymis is palpably normal and non-tender. The left epididymis is  palpably normal and non-tender. The right testis is non-tender and without masses. The left testis is non-tender and without masses.  Lymphatics: The femoral and inguinal nodes are not enlarged or tender.  Skin: Normal skin turgor, no visible rash and no visible skin lesions.  Neuro/Psych:. Mood and affect are appropriate.    Results/Data Urine [Data Includes: Last 1 Day]   [id number]  COLOR YELLOW   APPEARANCE CLEAR   SPECIFIC GRAVITY 1.025   pH 5.5   GLUCOSE NEG mg/dL  BILIRUBIN NEG   KETONE NEG mg/dL  BLOOD NEG   PROTEIN NEG mg/dL  UROBILINOGEN 0.2 mg/dL  NITRITE NEG   LEUKOCYTE ESTERASE NEG    Assessment Assessed  1. Hypogonadism 2. Erectile dysfunction (N52.9) 3. Hydronephrosis, left (N13.30)  Plan Erectile dysfunction  1. Follow-up Schedule Surgery Office  Follow-up  Status: Complete  Done: [id number] 2. TESTOSTERONE PANEL; Status:In Progress - Specimen/Data Collected;   Done:  [id number] 3. VENIPUNCTURE; Status:Complete;   Done: [id number] Health Maintenance  4. UA With REFLEX; [Do Not Release]; Status:Complete;   Done: [id number] 02:36PM  He needs cystoscopy, left retrograde pyelogram for further evaluation. The procedure, risks, benefits were explained to the patient. The risks include but are not limited to hemorrhage, infection, ureteral injury. He understands and wishes to proceed.

## 2014-03-14 NOTE — Anesthesia Procedure Notes (Signed)
Procedure Name: LMA Insertion Date/Time: 03/14/2014 2:16 PM Performed by: Wanita Chamberlain Pre-anesthesia Checklist: Patient identified, Timeout performed, Emergency Drugs available, Suction available and Patient being monitored Patient Re-evaluated:Patient Re-evaluated prior to inductionOxygen Delivery Method: Circle system utilized Preoxygenation: Pre-oxygenation with 100% oxygen Intubation Type: IV induction Ventilation: Mask ventilation without difficulty LMA: LMA inserted LMA Size: 5.0 Number of attempts: 1 Placement Confirmation: CO2 detector and breath sounds checked- equal and bilateral Tube secured with: Tape Dental Injury: Teeth and Oropharynx as per pre-operative assessment

## 2014-03-14 NOTE — Op Note (Signed)
[name] is a 68 y.o.   03/14/2014  General  Preop diagnosis: Left renal calculi, left hydronephrosis, possible retroperitoneal fibrosis.  Postop diagnosis: Same  Procedure done: Cystoscopy, left retrograde pyelogram, ureteroscopy, insertion of double-J stent  Surgeon: Charlene Brooke. Ritchard Paragas  Anesthesia: Gen.  Indication: Patient is a 68 years old male was found on CT scan to have nonobstructing left renal calculi and moderate left hydronephrosis. There are also some inflammatory changes of the mid and distal left ureter suggestive of retroperitoneal fibrosis. He is scheduled today for cystoscopy, retrograde pyelogram.  Procedure: Patient was identified by his wrist band and proper timeout was taken.  Under general anesthesia he was prepped and draped and placed in the dorsolithotomy position. A panendoscope was inserted in the bladder. The anterior urethra is normal. He has moderate prostatic hypertrophy. The bladder mucosa is normal. There is no stone or tumor in the bladder. The ureteral orifices are in normal position and shape area  Left retrograde pyelogram:  A cone-tip catheter was passed through the cystoscope and the left ureteral orifice. Contrast was then injected through the cone-tip catheter. There is a J home deformity of the distal ureter. Otherwise appears normal. I could not fill the mid ureter with contrast. The cone-tip catheter was removed. I passed a sensor wire through a #6 [place] open-ended catheter. The sensor wire and open-ended catheter were passed through the cystoscope. I placed the end of the open-ended catheter at the ureteral orifice but the sensor wire would not go up the ureter. The sensor wire was removed and replaced with a glide wire. The Glidewire advanced without difficulty into the proximal ureter. I then passed the open-ended catheter over the Glidewire into the distal ureter. I injected contrast to the open-ended catheter. There is an area of narrowing of  the mid ureter. There is no filling defect in the ureter. The ureter proximal to the area of narrowing is moderately dilated. The open-ended catheter was advanced into the proximal ureter and contrast was injected. The proximal ureter and the calyces are moderately dilated. The sensor wire was then passed through the open-ended catheter into the renal pelvis and the open-ended catheter was removed.  I left the sensor wire in place as a safety wire. I removed the cystoscope. A semirigid ureteroscope was passed in the bladder and the ureter. There is no stone or tumor in the ureter. I was able to pass the ureteroscope into the proximal ureter without any difficulty. The ureteroscope was then removed. The sensor wire was then backloaded into the cystoscope and a #6 French-[id number] double-J stent was passed over the sensor wire. The proximal curl of the double-J stent is in the collecting system. The distal curl is in the bladder. The bladder was then emptied and the cystoscope and sensor wire were removed.  Patient tolerated the procedure well and left the OR in satisfactory condition to postanesthesia care unit

## 2014-03-14 NOTE — Anesthesia Postprocedure Evaluation (Signed)
  Anesthesia Post-op Note  Patient: [name]  Procedure(s) Performed: Procedure(s) (LRB): CYSTOSCOPY WITH LEFT RETROGRADE PYELOGRAM, Left Ureteroscopy, Lweft ureteral Stent No string (Left)  Patient Location: PACU  Anesthesia Type: General  Level of Consciousness: awake and alert   Airway and Oxygen Therapy: Patient Spontanous Breathing  Post-op Pain: mild  Post-op Assessment: Post-op Vital signs reviewed, Patient's Cardiovascular Status Stable, Respiratory Function Stable, Patent Airway and No signs of Nausea or vomiting  Last Vitals:  Filed Vitals:   03/14/14 1515  BP: 175/77  Pulse: 59  Temp:   Resp: 15    Post-op Vital Signs: stable   Complications: No apparent anesthesia complications

## 2014-03-17 ENCOUNTER — Encounter (HOSPITAL_BASED_OUTPATIENT_CLINIC_OR_DEPARTMENT_OTHER): Payer: Self-pay | Admitting: Urology

## 2014-03-17 LAB — POCT I-STAT 4, (NA,K, GLUC, HGB,HCT)
Glucose, Bld: 115 mg/dL — ABNORMAL HIGH (ref 70–99)
HCT: 38 % — ABNORMAL LOW (ref 39.0–52.0)
HEMOGLOBIN: 12.9 g/dL — AB (ref 13.0–17.0)
Potassium: 4.4 mEq/L (ref 3.7–5.3)
Sodium: 140 mEq/L (ref 137–147)

## 2014-10-21 ENCOUNTER — Other Ambulatory Visit (HOSPITAL_COMMUNITY): Payer: Self-pay | Admitting: Respiratory Therapy

## 2014-10-21 DIAGNOSIS — R0600 Dyspnea, unspecified: Secondary | ICD-10-CM

## 2014-10-21 DIAGNOSIS — J441 Chronic obstructive pulmonary disease with (acute) exacerbation: Secondary | ICD-10-CM

## 2014-10-24 ENCOUNTER — Other Ambulatory Visit (HOSPITAL_COMMUNITY)
Admission: RE | Admit: 2014-10-24 | Discharge: 2014-10-24 | Disposition: A | Discharge status: Home / Self Care | Payer: Medicare HMO | Source: Ambulatory Visit | Attending: Cardiology | Admitting: Cardiology

## 2014-10-24 ENCOUNTER — Ambulatory Visit (HOSPITAL_COMMUNITY)
Admission: RE | Admit: 2014-10-24 | Discharge: 2014-10-24 | Disposition: A | Discharge status: Home / Self Care | Payer: Medicare HMO | Source: Ambulatory Visit | Attending: Cardiology | Admitting: Cardiology

## 2014-10-24 ENCOUNTER — Encounter (HOSPITAL_COMMUNITY): Payer: Medicare PPO

## 2014-10-24 DIAGNOSIS — J441 Chronic obstructive pulmonary disease with (acute) exacerbation: Secondary | ICD-10-CM | POA: Diagnosis not present

## 2014-10-24 LAB — URINALYSIS, ROUTINE W REFLEX MICROSCOPIC
Bilirubin Urine: NEGATIVE
Glucose, UA: NEGATIVE mg/dL
Hgb urine dipstick: NEGATIVE
KETONES UR: NEGATIVE mg/dL
Leukocytes, UA: NEGATIVE
NITRITE: NEGATIVE
PH: 5 (ref 5.0–8.0)
Protein, ur: NEGATIVE mg/dL
Specific Gravity, Urine: 1.021 (ref 1.005–1.030)
Urobilinogen, UA: 0.2 mg/dL (ref 0.0–1.0)

## 2014-10-24 LAB — LIPID PANEL
Cholesterol: 141 mg/dL (ref 0–200)
HDL: 31 mg/dL — ABNORMAL LOW (ref >40)
LDL CALC: 69 mg/dL (ref 0–99)
Total CHOL/HDL Ratio: 4.5 RATIO
Triglycerides: 205 mg/dL — ABNORMAL HIGH (ref <150)
VLDL: 41 mg/dL — ABNORMAL HIGH (ref 0–40)

## 2014-10-24 LAB — CBC
HEMATOCRIT: 35.5 % — AB (ref 39.0–52.0)
Hemoglobin: 12.1 g/dL — ABNORMAL LOW (ref 13.0–17.0)
MCH: 30.9 pg (ref 26.0–34.0)
MCHC: 34.1 g/dL (ref 30.0–36.0)
MCV: 90.6 fL (ref 78.0–100.0)
PLATELETS: 362 10*3/uL (ref 150–400)
RBC: 3.92 MIL/uL — ABNORMAL LOW (ref 4.22–5.81)
RDW: 13.5 % (ref 11.5–15.5)
WBC: 4.4 10*3/uL (ref 4.0–10.5)

## 2014-10-24 LAB — COMPREHENSIVE METABOLIC PANEL
ALBUMIN: 4.2 g/dL (ref 3.5–5.0)
ALT: 16 U/L — ABNORMAL LOW (ref 17–63)
AST: 14 U/L — AB (ref 15–41)
Alkaline Phosphatase: 76 U/L (ref 38–126)
Anion gap: 4 — ABNORMAL LOW (ref 5–15)
BUN: 25 mg/dL — ABNORMAL HIGH (ref 6–20)
CALCIUM: 9 mg/dL (ref 8.9–10.3)
CHLORIDE: 112 mmol/L — AB (ref 101–111)
CO2: 23 mmol/L (ref 22–32)
Creatinine, Ser: 1.17 mg/dL (ref 0.61–1.24)
GFR calc Af Amer: >60 mL/min (ref >60)
GLUCOSE: 107 mg/dL — AB (ref 65–99)
POTASSIUM: 4.8 mmol/L (ref 3.5–5.1)
SODIUM: 139 mmol/L (ref 135–145)
Total Bilirubin: 0.4 mg/dL (ref 0.3–1.2)
Total Protein: 8.3 g/dL — ABNORMAL HIGH (ref 6.5–8.1)

## 2014-10-25 LAB — MICROALBUMIN, URINE: MICROALB UR: 5.4 ug/mL — AB

## 2014-10-25 LAB — HEMOGLOBIN A1C
Hgb A1c MFr Bld: 7.2 % — ABNORMAL HIGH (ref 4.8–5.6)
MEAN PLASMA GLUCOSE: 160 mg/dL

## 2014-10-27 LAB — PULMONARY FUNCTION TEST
DL/VA % PRED: 89 %
DL/VA: 4.29 ml/min/mmHg/L
DLCO UNC: 28.51 ml/min/mmHg
DLCO unc % pred: 75 %
FEF 25-75 Pre: 2.86 L/sec
FEF2575-%Pred-Pre: 98 %
FEV1-%Pred-Pre: 91 %
FEV1-PRE: 3.1 L
FEV1FVC-%Pred-Pre: 102 %
FEV6-%Pred-Pre: 89 %
FEV6-PRE: 3.83 L
FEV6FVC-%Pred-Pre: 102 %
FVC-%Pred-Pre: 87 %
FVC-Pre: 3.91 L
Pre FEV1/FVC ratio: 79 %
Pre FEV6/FVC Ratio: 98 %
RV % pred: 130 %
RV: 3.46 L
TLC % pred: 94 %
TLC: 7.38 L

## 2015-03-02 ENCOUNTER — Ambulatory Visit: Payer: Medicare HMO | Admitting: Podiatry

## 2015-04-11 ENCOUNTER — Emergency Department (HOSPITAL_COMMUNITY)
Admission: EM | Admit: 2015-04-11 | Discharge: 2015-04-11 | Disposition: A | Discharge status: Home / Self Care | Payer: Medicare HMO | Attending: Emergency Medicine | Admitting: Emergency Medicine

## 2015-04-11 ENCOUNTER — Encounter (HOSPITAL_COMMUNITY): Payer: Self-pay

## 2015-04-11 ENCOUNTER — Emergency Department (HOSPITAL_COMMUNITY): Payer: Medicare HMO

## 2015-04-11 DIAGNOSIS — Z8719 Personal history of other diseases of the digestive system: Secondary | ICD-10-CM | POA: Insufficient documentation

## 2015-04-11 DIAGNOSIS — Z79899 Other long term (current) drug therapy: Secondary | ICD-10-CM | POA: Diagnosis not present

## 2015-04-11 DIAGNOSIS — Y9289 Other specified places as the place of occurrence of the external cause: Secondary | ICD-10-CM | POA: Diagnosis not present

## 2015-04-11 DIAGNOSIS — Y998 Other external cause status: Secondary | ICD-10-CM | POA: Diagnosis not present

## 2015-04-11 DIAGNOSIS — Z87891 Personal history of nicotine dependence: Secondary | ICD-10-CM | POA: Insufficient documentation

## 2015-04-11 DIAGNOSIS — Z23 Encounter for immunization: Secondary | ICD-10-CM | POA: Diagnosis not present

## 2015-04-11 DIAGNOSIS — I2510 Atherosclerotic heart disease of native coronary artery without angina pectoris: Secondary | ICD-10-CM | POA: Insufficient documentation

## 2015-04-11 DIAGNOSIS — S62635B Displaced fracture of distal phalanx of left ring finger, initial encounter for open fracture: Secondary | ICD-10-CM | POA: Insufficient documentation

## 2015-04-11 DIAGNOSIS — S61319A Laceration without foreign body of unspecified finger with damage to nail, initial encounter: Secondary | ICD-10-CM

## 2015-04-11 DIAGNOSIS — I252 Old myocardial infarction: Secondary | ICD-10-CM | POA: Diagnosis not present

## 2015-04-11 DIAGNOSIS — Y9389 Activity, other specified: Secondary | ICD-10-CM | POA: Insufficient documentation

## 2015-04-11 DIAGNOSIS — E119 Type 2 diabetes mellitus without complications: Secondary | ICD-10-CM | POA: Insufficient documentation

## 2015-04-11 DIAGNOSIS — M1990 Unspecified osteoarthritis, unspecified site: Secondary | ICD-10-CM | POA: Diagnosis not present

## 2015-04-11 DIAGNOSIS — I10 Essential (primary) hypertension: Secondary | ICD-10-CM | POA: Insufficient documentation

## 2015-04-11 DIAGNOSIS — IMO0001 Reserved for inherently not codable concepts without codable children: Secondary | ICD-10-CM

## 2015-04-11 DIAGNOSIS — Z8669 Personal history of other diseases of the nervous system and sense organs: Secondary | ICD-10-CM | POA: Diagnosis not present

## 2015-04-11 DIAGNOSIS — S6992XA Unspecified injury of left wrist, hand and finger(s), initial encounter: Secondary | ICD-10-CM | POA: Diagnosis present

## 2015-04-11 DIAGNOSIS — J45909 Unspecified asthma, uncomplicated: Secondary | ICD-10-CM | POA: Insufficient documentation

## 2015-04-11 DIAGNOSIS — Z792 Long term (current) use of antibiotics: Secondary | ICD-10-CM | POA: Insufficient documentation

## 2015-04-11 DIAGNOSIS — W231XXA Caught, crushed, jammed, or pinched between stationary objects, initial encounter: Secondary | ICD-10-CM | POA: Insufficient documentation

## 2015-04-11 DIAGNOSIS — Z87448 Personal history of other diseases of urinary system: Secondary | ICD-10-CM | POA: Diagnosis not present

## 2015-04-11 DIAGNOSIS — Z7982 Long term (current) use of aspirin: Secondary | ICD-10-CM | POA: Diagnosis not present

## 2015-04-11 DIAGNOSIS — Z87442 Personal history of urinary calculi: Secondary | ICD-10-CM | POA: Diagnosis not present

## 2015-04-11 MED ORDER — HYDROCODONE-ACETAMINOPHEN 5-325 MG PO TABS: 1.0000 | ORAL_TABLET | ORAL | Status: DC | PRN

## 2015-04-11 MED ORDER — TETANUS-DIPHTH-ACELL PERTUSSIS 5-2.5-18.5 LF-MCG/0.5 IM SUSP
0.5000 mL | Freq: Once | INTRAMUSCULAR | Status: AC
  Administered 2015-04-11: 0.5 mL via INTRAMUSCULAR
  Filled 2015-04-11: qty 0.5

## 2015-04-11 MED ORDER — CEPHALEXIN 500 MG PO CAPS: 500.0000 mg | ORAL_CAPSULE | Freq: Four times a day (QID) | ORAL | Status: DC

## 2015-04-11 MED ORDER — ACETAMINOPHEN 325 MG PO TABS
650.0000 mg | ORAL_TABLET | Freq: Once | ORAL | Status: AC
  Administered 2015-04-11: 650 mg via ORAL
  Filled 2015-04-11: qty 2

## 2015-04-11 MED ORDER — LIDOCAINE HCL (PF) 1 % IJ SOLN
30.0000 mL | Freq: Once | INTRAMUSCULAR | Status: DC
  Filled 2015-04-11: qty 30

## 2015-04-11 NOTE — ED Notes (Signed)
Pt smashed left ring finger between wood and wood cutter.  Finger nail loose and laceration to left lateral ring finger. Bleeding moderate.  Bandage applied.  Pt on daily 325 mg aspirin

## 2015-04-11 NOTE — ED Provider Notes (Signed)
CSN: [id number]     Arrival date & time 04/11/15  1250 History  By signing my name below, I, Eustaquio Maize, attest that this documentation has been prepared under the direction and in the presence of Gloriann Loan, PA-C. Electronically Signed: Eustaquio Maize, ED Scribe. 04/11/2015. 1:49 PM.   Chief Complaint  Patient presents with  . Finger Injury   The history is provided by the patient. No language interpreter was used.     HPI Comments: [name] is a 69 y.o. male who presents to the Emergency Department complaining of sudden onset, constant, mild, left 4th digit pain after smashing his finger in between wood and a wood cutter just PTA. There is a laceration to the lateral aspect of the finger. Pt also complains of mild intermittent tingling to the finger. Bleeding is controlled. Denies numbness, weakness, or any other associated symptoms. Pt is on 325 mg Aspirin every day.   Past Medical History  Diagnosis Date  . Hypertension   . Arthritis   . Shortness of breath     WITH EXERTION  . Neuromuscular disorder (HCC)     TINGLING IN BOTH HANDS  . Asthma     no inhaler  . History of MI (myocardial infarction)     1985  . Type 2 diabetes mellitus ([place])   . Hydronephrosis, left   . Nephrolithiasis     left  . Myocardial infarction ([place])   . GERD (gastroesophageal reflux disease)   . Presence of tooth-root and mandibular implants     lower dental implants  . Coronary artery disease     cardiologist-  dr spruill; last visit 3 mos ago per pt   Past Surgical History  Procedure Laterality Date  . Total hip arthroplasty Left 03-20-2009  . Total knee arthroplasty Left 05-16-2008  . Bunionectomy    . Hernia repair    . Total hip arthroplasty  04/09/2012    Procedure: TOTAL HIP ARTHROPLASTY;  Surgeon: [name], MD;  Location: [facility];  Service: Orthopedics;  Laterality: Right;  . Cystoscopy w/ retrogrades Left 03/14/2014    Procedure: CYSTOSCOPY WITH LEFT RETROGRADE  PYELOGRAM, Left Ureteroscopy, Lweft ureteral Stent No string;  Surgeon: [name], MD;  Location: [facility];  Service: Urology;  Laterality: Left;   History reviewed. No pertinent family history. Social History  Substance Use Topics  . Smoking status: Former Research scientist (life sciences)  . Smokeless tobacco: Former Systems developer    Quit date: 04/02/1985  . Alcohol Use: Yes     Comment: RARE    Review of Systems  Skin: Positive for wound.  Neurological: Negative for weakness and numbness.       + paresthesia to left 4th finger  All other systems reviewed and are negative.  Allergies  Shellfish allergy  Home Medications   Prior to Admission medications   Medication Sig Start Date End Date Taking? Authorizing Provider  amLODipine (NORVASC) 10 MG tablet Take 10 mg by mouth daily.   Yes Historical Provider, MD  aspirin 325 MG tablet Take 325 mg by mouth daily.   Yes Historical Provider, MD  atorvastatin (LIPITOR) 20 MG tablet Take 20 mg by mouth daily.   Yes Historical Provider, MD  carvedilol (COREG) 25 MG tablet Take 25 mg by mouth 2 (two) times daily with a meal.   Yes Historical Provider, MD  colchicine 0.6 MG tablet Take 0.6 mg by mouth 2 (two) times daily. 02/27/15  Yes Historical Provider, MD  glimepiride (  AMARYL) 4 MG tablet Take 4 mg by mouth daily before breakfast.   Yes Historical Provider, MD  metFORMIN (GLUCOPHAGE) 500 MG tablet Take 500 mg by mouth 3 (three) times daily. Pt takes 1 tablet twice daily and 1/2 tablet at lunch   Yes Historical Provider, MD  ofloxacin (OCUFLOX) 0.3 % ophthalmic solution Place 1 drop into both eyes 2 (two) times daily. 03/11/15  Yes Historical Provider, MD  PROLENSA 0.07 % SOLN Place 1 drop into both eyes every morning. 03/19/15  Yes Historical Provider, MD  valsartan-hydrochlorothiazide (DIOVAN-HCT) 320-12.5 MG per tablet Take 1 tablet by mouth every morning.   Yes Historical Provider, MD  cephALEXin (KEFLEX) 500 MG capsule Take 1 capsule (500 mg total) by  mouth 4 (four) times daily. Take 1 capsule (500 mg total) PO QID x 10 days. 04/11/15   Gloriann Loan, PA-C  HYDROcodone-acetaminophen (NORCO/VICODIN) 5-325 MG tablet Take 1-2 tablets by mouth every 4 (four) hours as needed. 04/11/15   Gloriann Loan, PA-C   Triage Vitals: BP 140/73 mmHg  Pulse 80  Temp(Src) 97.7 F (36.5 C) (Oral)  Resp 18  SpO2 97%   Physical Exam  Constitutional: He is oriented to person, place, and time. He appears well-developed and well-nourished. No distress.  HENT:  Head: Normocephalic and atraumatic.  Eyes: Conjunctivae and EOM are normal.  Neck: Neck supple. No tracheal deviation present.  Cardiovascular: Normal rate.   Capillary refill less than 3 seconds.   Pulmonary/Chest: Effort normal. No respiratory distress.  Musculoskeletal: Normal range of motion.  Neurological: He is alert and oriented to person, place, and time.  Strength and sensation intact distal to injury.  Skin: Skin is warm and dry. Laceration noted.  Laceration to lateral left ring finger extending into nail bed. Nail intact.   See photos below.   Psychiatric: He has a normal mood and affect. His behavior is normal.  Nursing note and vitals reviewed.       ED Course  Procedures (including critical care time)  DIAGNOSTIC STUDIES: Oxygen Saturation is 97% on RA, normal by my interpretation.    COORDINATION OF CARE: 1:46 PM-Discussed treatment plan which includes consulting with attending physician with pt at bedside and pt agreed to plan.   Labs Review Labs Reviewed - No data to display  Imaging Review Dg Finger Ring Left  04/11/2015  CLINICAL DATA:  Initial encounter for Pt states his finger was smashed between wood and a wood chipper. *Pt unable to remove ring from finger due to cast previously applied. EXAM: LEFT RING FINGER 2+V COMPARISON:  None. FINDINGS: overlying bandage, obscuring bony detail. Comminuted fracture of the tuft of the ring finger. No intra-articular extension. No  radiopaque foreign object. IMPRESSION: Comminuted fracture of the tuft of the fourth digit. Electronically Signed   By: Abigail Miyamoto M.D.   On: 04/11/2015 13:38   I have personally reviewed and evaluated these images as part of my medical decision-making.   EKG Interpretation None      MDM   Final diagnoses:  Open fracture of distal phalanx of fourth finger of left hand, initial encounter  Laceration of nail bed of finger, initial encounter   Patient presents with laceration to right ring finger just PTA.  Tetanus status unknown.  VSS, NAD.  On exam, NVI.  Laceration to lateral aspect of left ring finger that appears to involve nail bed, concerning for nail bed laceration.  Nail intact.  Plain films show comminuted fracture of the tuft of the 4th  digit.  Concern for open fracture with nail bed laceration.  Will consult hand.  3:30 PM: Dr. Amedeo Plenty will perform repair.   5:20 PM: Dr. Amedeo Plenty completed procedure.  Per Dr. Amedeo Plenty, will d/c home with keflex QID x 10 days and Norco 5-325 mg Q4H (40 tabs).  Follow up per his instructions.  Evaluation does not show pathology requiring ongoing emergent intervention or admission. Pt is hemodynamically stable and mentating appropriately. Discussed findings/results and plan with patient/guardian, who agrees with plan. All questions answered. Return precautions discussed and outpatient follow up given.   Case has been discussed with and3 seen by Dr. Rex Kras who agrees with the above plan for discharge.  I personally performed the services described in this documentation, which was scribed in my presence. The recorded information has been reviewed and is accurate.      Gloriann Loan, PA-C 04/11/15 [place], MD 04/27/15 [phone]

## 2015-04-11 NOTE — Consult Note (Signed)
Reason for Consult: Left ring finger wood splitting injury Referring Physician: ER physician  [name] is an 69 y.o. male.  HPI: 69 year old male status post wood splitting injury with open fracture nailbed and nail plate injury. The a patient has significant pain. He was seen and triaged by the emergency room department and I was asked to take over his care.  He denies other injury. He denies neck back or chest pain. This was an isolated event. He is here today with his family. I reviewed his chart at length.  Past Medical History  Diagnosis Date  . Hypertension   . Arthritis   . Shortness of breath     WITH EXERTION  . Neuromuscular disorder (HCC)     TINGLING IN BOTH HANDS  . Asthma     no inhaler  . History of MI (myocardial infarction)     1985  . Type 2 diabetes mellitus ([place])   . Hydronephrosis, left   . Nephrolithiasis     left  . Myocardial infarction ([place])   . GERD (gastroesophageal reflux disease)   . Presence of tooth-root and mandibular implants     lower dental implants  . Coronary artery disease     cardiologist-  dr spruill; last visit 3 mos ago per pt    Past Surgical History  Procedure Laterality Date  . Total hip arthroplasty Left 03-20-2009  . Total knee arthroplasty Left 05-16-2008  . Bunionectomy    . Hernia repair    . Total hip arthroplasty  04/09/2012    Procedure: TOTAL HIP ARTHROPLASTY;  Surgeon: [name], MD;  Location: [facility];  Service: Orthopedics;  Laterality: Right;  . Cystoscopy w/ retrogrades Left 03/14/2014    Procedure: CYSTOSCOPY WITH LEFT RETROGRADE PYELOGRAM, Left Ureteroscopy, [place] ureteral Stent No string;  Surgeon: [name], MD;  Location: [facility];  Service: Urology;  Laterality: Left;    History reviewed. No pertinent family history.  Social History:  reports that he has quit smoking. He quit smokeless tobacco use about 30 years ago. He reports that he drinks alcohol. He reports that he does  not use illicit drugs.  Allergies:  Allergies  Allergen Reactions  . Shellfish Allergy Rash    Medications: I have reviewed the patient's current medications.  No results found for this or any previous visit (from the past 48 hour(s)).  Dg Finger Ring Left  04/11/2015  CLINICAL DATA:  Initial encounter for Pt states his finger was smashed between wood and a wood chipper. *Pt unable to remove ring from finger due to cast previously applied. EXAM: LEFT RING FINGER 2+V COMPARISON:  None. FINDINGS: overlying bandage, obscuring bony detail. Comminuted fracture of the tuft of the ring finger. No intra-articular extension. No radiopaque foreign object. IMPRESSION: Comminuted fracture of the tuft of the fourth digit. Electronically Signed   By: Abigail Miyamoto M.D.   On: 04/11/2015 13:38    Review of Systems  Respiratory: Negative.   Gastrointestinal: Negative.   Genitourinary: Negative.   Musculoskeletal:       History of joint replacements by Dr. Elmyra Ricks  Neurological: Negative.   Psychiatric/Behavioral: Negative.    Blood pressure 140/73, pulse 80, temperature 97.7 F (36.5 C), temperature source Oral, resp. rate 18, SpO2 97 %. Physical Exam Open fracture left ring finger with the 3 fracture nailbed and nail plate disarray. He has open fracture and contamination.  He notes no other injury to the hand. Neck and back are  nontender his elbow wrist and forearm on the left side are nontender the adjacent digits are nontender and stable.  I reviewed his x-rays which show a comminuted fracture of the distal phalanx.  The patient is alert and oriented in no acute distress. The patient complains of pain in the affected upper extremity.  The patient is noted to have a normal HEENT exam. Lung fields show equal chest expansion and no shortness of breath. Abdomen exam is nontender without distention. Lower extremity examination does not show any fracture dislocation or blood clot symptoms. Pelvis  is stable and the neck and back are stable and nontender. Assessment/Plan: Nailbed injury with associated open fracture left ring finger after a wood splitting injury. I have consented the patient for repair reconstruction. We are planning surgery for your upper extremity. The risk and benefits of surgery to include risk of bleeding, infection, anesthesia,  damage to normal structures and failure of the surgery to accomplish its intended goals of relieving symptoms and restoring function have been discussed in detail. With this in mind we plan to proceed. I have specifically discussed with the patient the pre-and postoperative regime and the dos and don'ts and risk and benefits in great detail. Risk and benefits of surgery also include risk of dystrophy(CRPS), chronic nerve pain, failure of the healing process to go onto completion and other inherent risks of surgery The relavent the pathophysiology of the disease/injury process, as well as the alternatives for treatment and postoperative course of action has been discussed in great detail with the patient who desires to proceed.  We will do everything in our power to help you (the patient) restore function to the upper extremity. It is a pleasure to see this patient today.    04/11/2015  5:26 PM  PATIENT:  [name]    PRE-OPERATIVE DIAGNOSIS:  Left ring finger open fracture with NBI  POST-OPERATIVE DIAGNOSIS:  Same  PROCEDURE:  I&D,Open treatment P3 Fx, NBR, NPR, soft tissue reconstruction  SURGEON:  Paulene Floor, MD  PHYSICIAN ASSISTANT: none  ANESTHESIA:   block  PREOPERATIVE INDICATIONS:  [name] is a  69 y.o. male with a diagnosis of   The risks benefits and alternatives were discussed with the patient preoperatively including but not limited to the risks of infection, bleeding, nerve injury, cardiopulmonary complications, the need for revision surgery, among others, and the patient was willing to  proceed.   OPERATIVE PROCEDURE: Patient was seen sterilely prepped and draped in the sterile fashion after a intermetacarpal block was performed with lidocaine without epinephrine. Patient then had nail plate removed with Desiree Hane. Following this the patient went irrigation debridement and open fracture about the distal phalanx. This was an excisional debridement of a open fracture performed with curette knife blade and scissor. Following this patient underwent setting technique of the distal phalanx with open treatment of distal phalanx fracture. Following this the patient underwent meticulous repair of the nail bed with 4-0 loupe magnification and 6-0 chromic suture for the complex repair. Next the lateral nail fold was repaired. Following this Adaptic was placed under the eponychial fold to prevent nailbed adherent and the patient was dressed sterilely.  Thus the patient underwent irrigation and debridement of an open fracture no plate removal, nailbed repair, open treatment of distal phalanx fracture.     Patient will be on Keflex 500 mg 1 by mouth 4 times a day 10 days. He will follow-up in my office in 10 days. We  will carefully remove his bandage and make a fingertip protector for him.  I discussed with patient Norco use when necessary pain dispensed #40 one to 2 tablets every 4 hours  He understands all these and does. I verbalized all instructions with the family.  We recommend that you to take vitamin C 1000 mg a day to promote healing. We also recommend that if you require  pain medicine that you take a stool softener to prevent constipation as most pain medicines will have constipation side effects. We recommend either Peri-Colace or Senokot and recommend that you also consider adding MiraLAX to prevent the constipation affects from pain medicine if you are required to use them. These medicines are over the counter and maybe purchased at a local pharmacy. A cup of  yogurt and a probiotic can also be helpful during the recovery process as the medicines can disrupt your intestinal environment. Keep bandage clean and dry.  Call for any problems.  No smoking.  Criteria for driving a car: you should be off your pain medicine for 7-8 hours, able to drive one handed(confident), thinking clearly and feeling able in your judgement to drive. Continue elevation as it will decrease swelling.  If instructed by MD move your fingers within the confines of the bandage/splint.  Use ice if instructed by your MD. Call immediately for any sudden loss of feeling in your hand/arm or change in functional abilities of the extremity.  Paulene Floor 04/11/2015, 5:23 PM

## 2015-04-11 NOTE — ED Notes (Signed)
Bed: WA27 Expected date:  Expected time:  Means of arrival:  Comments: TR 2

## 2015-04-11 NOTE — Discharge Instructions (Signed)
Finger Fracture Fractures of fingers are breaks in the bones of the fingers. There are many types of fractures. There are different ways of treating these fractures. Your health care provider will discuss the best way to treat your fracture. CAUSES Traumatic injury is the main cause of broken fingers. These include:  Injuries while playing sports.  Workplace injuries.  Falls. RISK FACTORS Activities that can increase your risk of finger fractures include:  Sports.  Workplace activities that involve machinery.  A condition called osteoporosis, which can make your bones less dense and cause them to fracture more easily. SIGNS AND SYMPTOMS The main symptoms of a broken finger are pain and swelling within 15 minutes after the injury. Other symptoms include:  Bruising of your finger.  Stiffness of your finger.  Numbness of your finger.  Exposed bones (compound fracture) if the fracture is severe. DIAGNOSIS  The best way to diagnose a broken bone is with X-ray imaging. Additionally, your health care provider will use this X-ray image to evaluate the position of the broken finger bones.  TREATMENT  Finger fractures can be treated with:   Nonreduction--This means the bones are in place. The finger is splinted without changing the positions of the bone pieces. The splint is usually left on for about a week to 10 days. This will depend on your fracture and what your health care provider thinks.  Closed reduction--The bones are put back into position without using surgery. The finger is then splinted.  Open reduction and internal fixation--The fracture site is opened. Then the bone pieces are fixed into place with pins or some type of hardware. This is seldom required. It depends on the severity of the fracture. HOME CARE INSTRUCTIONS   Follow your health care provider's instructions regarding activities, exercises, and physical therapy.  Only take over-the-counter or prescription  medicines for pain, discomfort, or fever as directed by your health care provider. SEEK MEDICAL CARE IF: You have pain or swelling that limits the motion or use of your fingers. SEEK IMMEDIATE MEDICAL CARE IF:  Your finger becomes numb. MAKE SURE YOU:   Understand these instructions.  Will watch your condition.  Will get help right away if you are not doing well or get worse.   This information is not intended to replace advice given to you by your health care provider. Make sure you discuss any questions you have with your health care provider.   Document Released: 07/10/2000 Document Revised: 01/16/2013 Document Reviewed: 11/07/2012 Elsevier Interactive Patient Education 2016 [place].  Nail Bed Injury  The nail bed is the soft tissue under a fingernail or toenail that is the origin for new nail growth. Various types of injuries can occur at the nail bed. These injuries may involve bruising or bleeding under the nail, cuts (lacerations) in the nail or nail bed, or loss of a part of the nail or the whole nail (avulsion). In some cases, a nail bed injury accompanies another injury, such as a break (fracture) of the bone at the tip of the finger or toe. Nail bed injuries are common in people who have jobs that require performing manual tasks with their hands, such as carpenters and landscapers.  The nail bed includes the growth center of the nail. If this growth center is damaged, the injured nail may not grow back normally if at all. The regrown nail might have an abnormal shape or appearance. It can take several months for a damaged or torn-off nail to regrow.  Depending on the nature and extent of the nail bed injury, there may be a permanent disruption of normal nail growth.  CAUSES  Damage to the nail bed area is usually caused by crushing, pinching, cutting, or tearing injuries of the fingertip or toe. For example, these injuries may occur when a fingertip gets caught in a door, hit by  a hammer, or damaged in accidents involving electrical tools or power machinery.  SYMPTOMS  Symptoms vary depending on the nature of the injury. Symptoms may include:  Pain in the injured area.  Bleeding.  Swelling.  Discoloration.  Collection of blood under the nail (hematoma).  Deformed or split nail.  Loose nail (not stuck to the nail bed).  Loss of all or part of the nail. DIAGNOSIS  Your caregiver will take a medical history and examine the injured area. You will be asked to describe how the injury occurred. X-rays may be done to see if you have a fracture. Your caregiver might also check for conditions that may affect healing, such as diabetes, nerve problems, or poor circulation.  TREATMENT  Treatment depends on the type of injury.  The injury may not require any special treatment other than keeping the area clean and free of infection.  Your caregiver may drain the collection of blood from under the nail. This can be done by making a small hole in the nail.  Your caregiver may remove all or part of your nail. This might be necessary to stitch (suture) any laceration in the nail bed. Before doing this, the caregiver will likely give you medication to numb the nail area (local anesthetic). In some cases, the caregiver may choose to numb the entire finger or toe (digital nerve block). Depending on the location and size of the nail bed injury, an avulsed nail is sometimes stitched back in place to provide temporary protection to the nail bed until the new nail grows in.  Your caregiver may apply bandages (dressings) or splints to the area.  You might be prescribed antibiotic medication to help prevent infection.  For certain injuries, your caregiver may direct you to see a hand or foot specialist.  You may need a tetanus shot if:  You cannot remember when you had your last tetanus shot.  You have never had a tetanus shot.  The injury broke your skin. If you get a tetanus shot, your arm  may swell, get red, and feel warm to the touch. This is common and not a problem. If you need a tetanus shot and you choose not to have one, there is a rare chance of getting tetanus. Sickness from tetanus can be serious.  HOME CARE INSTRUCTIONS  Keep your hand or foot raised (elevated) to relieve pain and swelling.  For an injured toenail, lie in bed or on a couch with your leg on pillows. You can also sit in a recliner with your leg up. Avoid walking or letting your leg dangle. When you walk, wear an open-toe shoe.  For an injured fingernail, keep your hand above the level of your heart. Use pillows on a table or on the arm of your chair while sitting. Use them on your bed while sleeping.  Keep your injury protected with dressings or splints as directed by your caregiver.  Keep any dressings clean and dry. Change or remove your dressings as directed by your caregiver.  Only take over-the-counter or prescription medications as directed by your caregiver. If you were prescribed antibiotics, take  them as directed. Finish them even if you start to feel better.  Follow up with your caregiver as directed.  SEEK MEDICAL CARE IF:  You have pain that is not controlled with medication.  You have any problems caring for your injury.  SEEK IMMEDIATE MEDICAL CARE IF:  You have increased pain, drainage, or bleeding in the injured area.  You have redness, soreness, and swelling (inflammation) in the injured area.  You have a fever or persistent symptoms for more than 2-3 days.  You have a fever and your symptoms suddenly get worse.  You have swelling that spreads from your finger into your hand or from your toe into your foot.  MAKE SURE YOU:  Understand these instructions.  Will watch your condition.  Will get help right away if you are not doing well or get worse. This information is not intended to replace advice given to you by your health care provider. Make sure you discuss any questions you have with  your health care provider.  Document Released: 05/05/2004 Document Revised: 07/23/2012 Document Reviewed: 04/19/2012  Elsevier Interactive Patient Education 2016 [place] bandage clean and dry.  Call for any problems.  No smoking.  Criteria for driving a car: you should be off your pain medicine for 7-8 hours, able to drive one handed(confident), thinking clearly and feeling able in your judgement to drive. Continue elevation as it will decrease swelling.  If instructed by MD move your fingers within the confines of the bandage/splint.  Use ice if instructed by your MD. Call immediately for any sudden loss of feeling in your hand/arm or change in functional abilities of the extremity.We recommend that you to take vitamin C 1000 mg a day to promote healing. We also recommend that if you require  pain medicine that you take a stool softener to prevent constipation as most pain medicines will have constipation side effects. We recommend either Peri-Colace or Senokot and recommend that you also consider adding MiraLAX to prevent the constipation affects from pain medicine if you are required to use them. These medicines are over the counter and maybe purchased at a local pharmacy. A cup of yogurt and a probiotic can also be helpful during the recovery process as the medicines can disrupt your intestinal environment.

## 2015-06-18 ENCOUNTER — Other Ambulatory Visit (HOSPITAL_COMMUNITY)
Admission: RE | Admit: 2015-06-18 | Discharge: 2015-06-18 | Disposition: A | Discharge status: Home / Self Care | Payer: Medicare HMO | Source: Ambulatory Visit | Attending: Cardiology | Admitting: Cardiology

## 2015-06-18 DIAGNOSIS — E119 Type 2 diabetes mellitus without complications: Secondary | ICD-10-CM | POA: Insufficient documentation

## 2015-06-18 LAB — URINALYSIS, ROUTINE W REFLEX MICROSCOPIC
Bilirubin Urine: NEGATIVE
GLUCOSE, UA: 250 mg/dL — AB
HGB URINE DIPSTICK: NEGATIVE
Ketones, ur: NEGATIVE mg/dL
LEUKOCYTES UA: NEGATIVE
Nitrite: NEGATIVE
Protein, ur: NEGATIVE mg/dL
Specific Gravity, Urine: 1.018 (ref 1.005–1.030)
pH: 6.5 (ref 5.0–8.0)

## 2015-06-18 LAB — CBC
HEMATOCRIT: 38.7 % — AB (ref 39.0–52.0)
Hemoglobin: 12.9 g/dL — ABNORMAL LOW (ref 13.0–17.0)
MCH: 31.1 pg (ref 26.0–34.0)
MCHC: 33.3 g/dL (ref 30.0–36.0)
MCV: 93.3 fL (ref 78.0–100.0)
Platelets: 388 10*3/uL (ref 150–400)
RBC: 4.15 MIL/uL — ABNORMAL LOW (ref 4.22–5.81)
RDW: 14.3 % (ref 11.5–15.5)
WBC: 4.5 10*3/uL (ref 4.0–10.5)

## 2015-06-18 LAB — LIPID PANEL
Cholesterol: 155 mg/dL (ref 0–200)
HDL: 35 mg/dL — AB (ref >40)
LDL CALC: 88 mg/dL (ref 0–99)
Total CHOL/HDL Ratio: 4.4 RATIO
Triglycerides: 160 mg/dL — ABNORMAL HIGH (ref <150)
VLDL: 32 mg/dL (ref 0–40)

## 2015-06-18 LAB — COMPREHENSIVE METABOLIC PANEL
ALT: 18 U/L (ref 17–63)
AST: 15 U/L (ref 15–41)
Albumin: 4.7 g/dL (ref 3.5–5.0)
Alkaline Phosphatase: 81 U/L (ref 38–126)
Anion gap: 9 (ref 5–15)
BILIRUBIN TOTAL: 0.7 mg/dL (ref 0.3–1.2)
BUN: 18 mg/dL (ref 6–20)
CO2: 26 mmol/L (ref 22–32)
Calcium: 9.5 mg/dL (ref 8.9–10.3)
Chloride: 107 mmol/L (ref 101–111)
Creatinine, Ser: 1.09 mg/dL (ref 0.61–1.24)
GFR calc Af Amer: >60 mL/min (ref >60)
GFR calc non Af Amer: >60 mL/min (ref >60)
GLUCOSE: 151 mg/dL — AB (ref 65–99)
POTASSIUM: 4.4 mmol/L (ref 3.5–5.1)
SODIUM: 142 mmol/L (ref 135–145)
TOTAL PROTEIN: 8.7 g/dL — AB (ref 6.5–8.1)

## 2015-06-18 LAB — IRON AND TIBC
Iron: 102 ug/dL (ref 45–182)
Saturation Ratios: 31 % (ref 17.9–39.5)
TIBC: 326 ug/dL (ref 250–450)
UIBC: 224 ug/dL

## 2015-06-19 LAB — MICROALBUMIN, URINE: MICROALB UR: 41.3 ug/mL — AB

## 2015-06-19 LAB — HEMOGLOBIN A1C
HEMOGLOBIN A1C: 7.9 % — AB (ref 4.8–5.6)
MEAN PLASMA GLUCOSE: 180 mg/dL

## 2015-06-26 ENCOUNTER — Ambulatory Visit (INDEPENDENT_AMBULATORY_CARE_PROVIDER_SITE_OTHER): Payer: Medicare HMO | Admitting: Podiatry

## 2015-06-26 ENCOUNTER — Encounter: Payer: Self-pay | Admitting: Podiatry

## 2015-06-26 ENCOUNTER — Ambulatory Visit (INDEPENDENT_AMBULATORY_CARE_PROVIDER_SITE_OTHER): Payer: Medicare HMO

## 2015-06-26 VITALS — BP 135/67 | HR 71 | Resp 16 | Ht 74.0 in | Wt 240.0 lb

## 2015-06-26 DIAGNOSIS — M21619 Bunion of unspecified foot: Secondary | ICD-10-CM | POA: Diagnosis not present

## 2015-06-26 DIAGNOSIS — M779 Enthesopathy, unspecified: Secondary | ICD-10-CM

## 2015-06-26 DIAGNOSIS — M205X1 Other deformities of toe(s) (acquired), right foot: Secondary | ICD-10-CM | POA: Diagnosis not present

## 2015-06-26 MED ORDER — TRIAMCINOLONE ACETONIDE 10 MG/ML IJ SUSP
10.0000 mg | Freq: Once | INTRAMUSCULAR | Status: AC
  Administered 2015-06-26: 10 mg

## 2015-06-26 NOTE — Progress Notes (Signed)
   Subjective:    Patient ID: [name], male    DOB: [birth year], 70 y.o.   MRN: [id number]  HPI Patient presents with foot pain in their right foot; bunion. Pt stated, "Was told had gout from his primary care doctor last year; no lab work was taken".  Patient diabetic type 2; sugar=did not take today; A1C=7.9  Review of Systems  HENT: Positive for sinus pressure.   All other systems reviewed and are negative.      Objective:   Physical Exam        Assessment & Plan:

## 2015-06-26 NOTE — Progress Notes (Signed)
Subjective:     Patient ID: [name], male   DOB: 1945-06-07, 70 y.o.   MRN: [id number]  HPI patient stated that she he had his left bunion fixed years ago by another physician and the right one has been worse and he admits she should've done this along time ago. It's tender at different times and at this time it is very inflamed   Review of Systems  All other systems reviewed and are negative.      Objective:   Physical Exam  Constitutional: He is oriented to person, place, and time.  Cardiovascular: Intact distal pulses.   Musculoskeletal: Normal range of motion.  Neurological: He is oriented to person, place, and time.  Skin: Skin is warm.  Nursing note and vitals reviewed.  neurovascular status intact muscle strength adequate range of motion within normal limits with patient found to have quite a bit of swelling around the first MPJ right foot with fluid buildup around the joint and states that it hurts him occasionally and seems to flareup at different times well-healed surgical site left first metatarsal with good range of motion and no range of motion of the first MPJ right noted. Patient also has good digital perfusion well oriented 3     Assessment:     Severe hallux limitus deformity right with bad structural bunion and inflammatory capsulitis    Plan:     H&P and x-rays reviewed with patient. Today I went ahead and I injected around the joint surface 3 mg Kenalog 5 mill grams Xylocaine and instructed on physical therapy. Reappoint for [place] to recheck again require hallux metatarsal fusion which I reviewed with him and caregiver and explained would be a difficult surgery  X-ray report indicate severe arthritis of the first MPJ right with severe structural bunion deformity and inflamed tissue

## 2015-10-22 ENCOUNTER — Other Ambulatory Visit: Payer: Self-pay | Admitting: Cardiology

## 2015-10-22 ENCOUNTER — Ambulatory Visit
Admission: RE | Admit: 2015-10-22 | Discharge: 2015-10-22 | Disposition: A | Discharge status: Home / Self Care | Payer: Medicare HMO | Source: Ambulatory Visit | Attending: Cardiology | Admitting: Cardiology

## 2015-10-22 DIAGNOSIS — M79672 Pain in left foot: Secondary | ICD-10-CM

## 2015-12-08 ENCOUNTER — Telehealth: Payer: Self-pay | Admitting: Radiation Oncology

## 2015-12-08 NOTE — Telephone Encounter (Signed)
Originally patient scheduled for a consult appointment with Dr. Tammi Klippel on Thursday, August 31. Per physician order instructed to reschedule appointment. Appointment moved to September 18th, at 0930. Arrive at [facility]. Phoned patient's home and mobile number. Left message on mobile phone for patient. Phoned patient's daughter, Wilburn Cornelia. Confirmed Sept 18th appointment with her.

## 2015-12-10 ENCOUNTER — Ambulatory Visit: Payer: Medicare HMO

## 2015-12-10 ENCOUNTER — Ambulatory Visit: Payer: Medicare HMO | Admitting: Radiation Oncology

## 2015-12-28 ENCOUNTER — Ambulatory Visit
Admission: RE | Admit: 2015-12-28 | Discharge: 2015-12-28 | Disposition: A | Discharge status: Home / Self Care | Payer: Medicare HMO | Source: Ambulatory Visit | Attending: Radiation Oncology | Admitting: Radiation Oncology

## 2015-12-28 ENCOUNTER — Encounter: Payer: Self-pay | Admitting: Radiation Oncology

## 2015-12-28 VITALS — BP 144/73 | HR 66 | Resp 16 | Ht 74.0 in | Wt 246.4 lb

## 2015-12-28 DIAGNOSIS — Z87891 Personal history of nicotine dependence: Secondary | ICD-10-CM | POA: Diagnosis not present

## 2015-12-28 DIAGNOSIS — C61 Malignant neoplasm of prostate: Secondary | ICD-10-CM | POA: Diagnosis present

## 2015-12-28 DIAGNOSIS — M1990 Unspecified osteoarthritis, unspecified site: Secondary | ICD-10-CM | POA: Insufficient documentation

## 2015-12-28 DIAGNOSIS — G709 Myoneural disorder, unspecified: Secondary | ICD-10-CM | POA: Diagnosis not present

## 2015-12-28 DIAGNOSIS — Z510 Encounter for antineoplastic radiation therapy: Secondary | ICD-10-CM | POA: Diagnosis not present

## 2015-12-28 DIAGNOSIS — K219 Gastro-esophageal reflux disease without esophagitis: Secondary | ICD-10-CM | POA: Insufficient documentation

## 2015-12-28 DIAGNOSIS — I10 Essential (primary) hypertension: Secondary | ICD-10-CM | POA: Diagnosis not present

## 2015-12-28 DIAGNOSIS — I252 Old myocardial infarction: Secondary | ICD-10-CM | POA: Insufficient documentation

## 2015-12-28 DIAGNOSIS — N1330 Unspecified hydronephrosis: Secondary | ICD-10-CM | POA: Insufficient documentation

## 2015-12-28 DIAGNOSIS — Z96652 Presence of left artificial knee joint: Secondary | ICD-10-CM | POA: Insufficient documentation

## 2015-12-28 DIAGNOSIS — Z96642 Presence of left artificial hip joint: Secondary | ICD-10-CM | POA: Insufficient documentation

## 2015-12-28 DIAGNOSIS — J45909 Unspecified asthma, uncomplicated: Secondary | ICD-10-CM | POA: Diagnosis not present

## 2015-12-28 DIAGNOSIS — I2510 Atherosclerotic heart disease of native coronary artery without angina pectoris: Secondary | ICD-10-CM | POA: Diagnosis not present

## 2015-12-28 HISTORY — DX: Malignant neoplasm of prostate: C61

## 2015-12-28 NOTE — Progress Notes (Signed)
Radiation Oncology         [phone] ________________________________  Initial outpatient Consultation  Name: [name] MRN: [id number]  Date: 12/28/2015  DOB: [birth year]  [id number] Jenetta Downer, MD  Festus Aloe, MD   REFERRING PHYSICIAN: Festus Aloe, MD  DIAGNOSIS: The encounter diagnosis was Malignant neoplasm of prostate [facility]).    ICD-9-CM ICD-10-CM   1. Malignant neoplasm of prostate ([place]) 185 C61     HISTORY OF PRESENT ILLNESS: [name] is a 70 y.o. male seen at the request of Dr. Junious Silk for a new diagnosis of prostate cancer. He was noted to have an elevated PSA of 4.42 in February 2016 by his primary care physician, Dr. Montez Morita and he was seen by Dr. Janice Norrie at that time.  His PSA was rechecked and was 9.18 in May 2017. Accordingly, he was seen by Dr. Junious Silk on 08/11/15,  digital rectal examination was performed at that time revealing no prostatic nodules.  The patient proceeded to transrectal ultrasound with 12 biopsies of the prostate on 10/28/15.  The prostate volume measured 45.92 cc.  Out of 12 core biopsies,6 were positive. The maximum Gleason score was 3+4, and this was seen in the right mid base.  The patient reviewed the biopsy results with his urologist and he has kindly been referred today for discussion of potential radiation treatment options.   PREVIOUS RADIATION THERAPY: No  PAST MEDICAL HISTORY:  Past Medical History:  Diagnosis Date  . Arthritis   . Asthma    no inhaler  . Coronary artery disease    cardiologist-  dr spruill; last visit 3 mos ago per pt  . GERD (gastroesophageal reflux disease)   . History of MI (myocardial infarction)    1985  . Hydronephrosis, left   . Hypertension   . Myocardial infarction ([place])   . Nephrolithiasis    left  . 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 Presence of tooth-root and mandibular implants    lower dental implants  . Prostate cancer ([place])   . Shortness of breath     WITH EXERTION  . Type 2 diabetes mellitus ([place])       PAST SURGICAL HISTORY: Past Surgical History:  Procedure Laterality Date  . BUNIONECTOMY    . CYSTOSCOPY W/ RETROGRADES Left 03/14/2014   Procedure: CYSTOSCOPY WITH LEFT RETROGRADE PYELOGRAM, Left Ureteroscopy, [place] ureteral Stent No string;  Surgeon: [name], MD;  Location: [facility];  Service: Urology;  Laterality: Left;  . HERNIA REPAIR    . PROSTATE BIOPSY    . TOTAL HIP ARTHROPLASTY Left 03-20-2009  . TOTAL HIP ARTHROPLASTY  04/09/2012   Procedure: TOTAL HIP ARTHROPLASTY;  Surgeon: [name], MD;  Location: [facility];  Service: Orthopedics;  Laterality: Right;  . TOTAL KNEE ARTHROPLASTY Left 05-16-2008    FAMILY HISTORY:  Family History  Problem Relation Age of Onset  . Cancer Mother     breast  . Multiple sclerosis Daughter   . Cancer Father     prostate  . Cancer Maternal Uncle     bone    SOCIAL HISTORY:  Social History   Social History  . Marital status: Widowed    Spouse name: N/A  . Number of children: N/A  . Years of education: N/A   Occupational History  . Not on file.   Social History Main Topics  . Smoking status: Former Smoker    Packs/day: 1.00    Years:  25.00    Types: Cigarettes    Quit date: 04/11/1984  . Smokeless tobacco: Former Systems developer    Quit date: 04/02/1985  . Alcohol use Yes     Comment: RARE  . Drug use: No  . Sexual activity: Not Currently   Other Topics Concern  . Not on file   Social History Narrative  . No narrative on file    ALLERGIES: Shellfish allergy  MEDICATIONS:  Current Outpatient Prescriptions  Medication Sig Dispense Refill  . amLODipine (NORVASC) 10 MG tablet Take 10 mg by mouth daily.    Marland Kitchen aspirin 325 MG tablet Take 325 mg by mouth daily.    Marland Kitchen atorvastatin (LIPITOR) 20 MG tablet Take 20 mg by mouth daily.    . carvedilol (COREG) 25 MG tablet Take 25 mg by mouth 2 (two) times daily with a meal.    . FLUZONE HIGH-DOSE 0.5 ML SUSY  TO BE ADMINISTERED BY PHARMACIST FOR IMMUNIZATION  0  . glimepiride (AMARYL) 4 MG tablet Take 4 mg by mouth daily before breakfast.    . metFORMIN (GLUCOPHAGE) 500 MG tablet Take 500 mg by mouth 3 (three) times daily. Pt takes 1 tablet twice daily and 1/2 tablet at lunch    . ofloxacin (OCUFLOX) 0.3 % ophthalmic solution Place 1 drop into both eyes 2 (two) times daily.  0  . valsartan-hydrochlorothiazide (DIOVAN-HCT) 320-12.5 MG per tablet Take 1 tablet by mouth every morning.    . colchicine 0.6 MG tablet Take 0.6 mg by mouth 2 (two) times daily.  6  . PROLENSA 0.07 % SOLN Place 1 drop into both eyes every morning.  1   No current facility-administered medications for this encounter.     REVIEW OF SYSTEMS:  On review of systems, the patient reports that he is doing well overall. He denies any chest pain, shortness of breath, cough, fevers, chills,  unintended weight changes. He experiences night sweats at times. He has difficulty postponing urination. He has nocturia x 4, and ED, and his IPSS is 5, with 4 points represent nocturia. He indicated difficulty with He denies any bowel disturbances, and denies abdominal pain, nausea or vomiting. He denies any new musculoskeletal or joint aches or pains. A complete review of systems is obtained and is otherwise negative.    PHYSICAL EXAM:  height is 6\' 2"  (1.88 m) and weight is 246 lb 6.4 oz (111.8 kg). His blood pressure is 144/73 (abnormal) and his pulse is 66. His respiration is 16 and oxygen saturation is 100%.   Pain Scale 0/10 In general this is a well appearing african [place] male in no acute distress. He is alert and oriented x4 and appropriate throughout the examination. HEENT reveals that the patient is normocephalic, atraumatic. EOMs are intact. PERRLA. Skin is intact without any evidence of gross lesions. Cardiovascular exam reveals a regular rate and rhythm, no clicks rubs or murmurs are auscultated. Chest is clear to auscultation  bilaterally. Lymphatic assessment is performed and does not reveal any adenopathy in the cervical, supraclavicular, axillary, or inguinal chains. Abdomen has active bowel sounds in all quadrants and is intact. Patient has a small palpable umbilical hernia that easily reduces, it is about 1 cm. The abdomen is soft, non tender, non distended. Lower extremities are negative for pretibial pitting edema, deep calf tenderness, cyanosis or clubbing.   KPS = 100  100 - Normal; no complaints; no evidence of disease. 90   - Able to carry on normal activity; minor signs or  symptoms of disease. 80   - Normal activity with effort; some signs or symptoms of disease. 66   - Cares for self; unable to carry on normal activity or to do active work. 60   - Requires occasional assistance, but is able to care for most of his personal needs. 50   - Requires considerable assistance and frequent medical care. 11   - Disabled; requires special care and assistance. 42   - Severely disabled; hospital admission is indicated although death not imminent. 7   - Very sick; hospital admission necessary; active supportive treatment necessary. 10   - Moribund; fatal processes progressing rapidly. 0     - Dead  Karnofsky DA, Abelmann Orrtanna, Craver LS and Burchenal [facility] [phone]) The use of the nitrogen mustards in the palliative treatment of carcinoma: with particular reference to bronchogenic carcinoma Cancer 1 634-56  LABORATORY DATA:  Lab Results  Component Value Date   WBC 4.5 06/18/2015   HGB 12.9 (L) 06/18/2015   HCT 38.7 (L) 06/18/2015   MCV 93.3 06/18/2015   PLT 388 06/18/2015   Lab Results  Component Value Date   NA 142 06/18/2015   K 4.4 06/18/2015   CL 107 06/18/2015   CO2 26 06/18/2015   Lab Results  Component Value Date   ALT 18 06/18/2015   AST 15 06/18/2015   ALKPHOS 81 06/18/2015   BILITOT 0.7 06/18/2015     RADIOGRAPHY: No results found.    IMPRESSION/PLAN: 1. 70 y.o. gentleman with intermediate  risk T1c adenocarcinoma of the prostate with a Gleason Score of 3+4, and a PSA of 9.18. Dr. Tammi Klippel discusses the findings from the patient's pathology and PSAs to date. He reviews the options for treatment of intermediate risk cancer. Given the patient's gland size he would be a candidate for brachytherapy versus external radiotherapy. We discussed the logistics and delivery as well as the risks, benefits, short and long term effects of radiotherapy and contrasted these over each modality of radiation. He is not interested in prostatectomy at this time, as well. The patient expressed interest in external radiotherapy. We will set him up for an evaluation with Dr. Junious Silk for gold fiducial markers, and see him back at least 3 days after placement to move forward with simulation. He states agreement and understanding of this plan. 2. Possible genetic predisposition for prostate cancer. Given his personal and family history we will refer him for genetic counseling.   The above documentation reflects my direct findings during this shared patient visit. Please see the separate note by Dr. Tammi Klippel on this date for the remainder of the patient's plan of care.   Carola Rhine, PAC  This document serves as a record of services personally performed by Tyler Pita, MD. It was created on his behalf by Bethann Humble, a trained medical scribe. The creation of this record is based on the scribe's personal observations and the provider's statements to them. This document has been checked and approved by the attending provider.

## 2015-12-28 NOTE — Progress Notes (Signed)
See progress note under physician encounter. 

## 2015-12-28 NOTE — Progress Notes (Signed)
GU Location of Tumor / Histology: prostatic adenocarcinoma  If Prostate Cancer, Gleason Score is (3 + 4) and PSA is (9.18)  [name] was formerly seen by Dr. Janice Norrie for management of left hydronephrosis, low testosterone, and ED. Patient presented to Dr. Junious Silk 08/11/15 to follow up about elevated PSA. Patient had a PSA of 0.8 the, 1.67 and 4.42 (september 2016).   Biopsies of prostate (if applicable) revealed:    Past/Anticipated interventions by urology, if any: biopsy, discussion about treatment options to include ADT, referral to Dr. Tammi Klippel to discuss IMRT or brachytherapy  Past/Anticipated interventions by medical oncology, if any: no  Weight changes, if any: no  Bowel/Bladder complaints, if any: IPSS 5 with frequency and nocturia x 4. Denies leakage or incontinence. Denies dysuria or hematuria. Denies any bowel complaints. Reports scrotal pain intermittently since biopsy.  Nausea/Vomiting, if any: no  Pain issues, if any:  no  SAFETY ISSUES:  Prior radiation? no  Pacemaker/ICD? no  Possible current pregnancy? no  Is the patient on methotrexate? no  Current Complaints / other details:  70 year old male. Widowed with three children (1 child passed away). Retired from work. Prostate volume 45.92 grams. In addition, patient reports night sweats, leg swelling and SOB.

## 2016-01-18 ENCOUNTER — Telehealth: Payer: Self-pay | Admitting: *Deleted

## 2016-01-18 NOTE — Telephone Encounter (Signed)
CALLED PATIENT TO INFORM OF GOLD SEED PLACEMENT ON 02/02/16 - ARRIVAL TIME - 3 PM @ DR. Lyndal Rainbow OFFICE AND HIS SIM ON 02-05-16 @ 2 PM @ DR. MANNING'S OFFICE, LVM FOR A RETURN CALL

## 2016-01-29 NOTE — Progress Notes (Signed)
  Radiation Oncology         [phone] ________________________________  Name: [name] MRN: [id number]  Date: 02/05/2016  DOB: [birth year]  SIMULATION AND TREATMENT PLANNING NOTE    ICD-9-CM ICD-10-CM   1. Malignant neoplasm of prostate ([place]) 185 C61     DIAGNOSIS:  70 y.o. gentleman with intermediate risk T1c adenocarcinoma of the prostate with a Gleason Score of 3+4, and a PSA of 9.18  NARRATIVE:  The patient was brought to the [facility].  Identity was confirmed.  All relevant records and images related to the planned course of therapy were reviewed.  The patient freely provided informed written consent to proceed with treatment after reviewing the details related to the planned course of therapy. The consent form was witnessed and verified by the simulation staff.  Then, the patient was set-up in a stable reproducible supine position for radiation therapy.  A vacuum lock pillow device was custom fabricated to position his legs in a reproducible immobilized position.  Then, I performed a urethrogram under sterile conditions to identify the prostatic apex.  CT images were obtained.  Surface markings were placed.  The CT images were loaded into the planning software.  Then the prostate target and avoidance structures including the rectum, bladder, bowel and hips were contoured.  Treatment planning then occurred.  The radiation prescription was entered and confirmed.  A total of one complex treatment device was fabricated. I have requested : Intensity Modulated Radiotherapy (IMRT) is medically necessary for this case for the following reason:  Rectal sparing.Marland Kitchen  PLAN:  The patient will receive 78 Gy in 40 fractions.  ________________________________  Sheral Apley Tammi Klippel, M.D.

## 2016-02-02 ENCOUNTER — Other Ambulatory Visit: Payer: Medicare HMO

## 2016-02-02 ENCOUNTER — Encounter: Payer: Medicare HMO | Admitting: Genetic Counselor

## 2016-02-05 ENCOUNTER — Ambulatory Visit
Admission: RE | Admit: 2016-02-05 | Discharge: 2016-02-05 | Disposition: A | Discharge status: Home / Self Care | Payer: Medicare HMO | Source: Ambulatory Visit | Attending: Radiation Oncology | Admitting: Radiation Oncology

## 2016-02-05 DIAGNOSIS — C61 Malignant neoplasm of prostate: Secondary | ICD-10-CM

## 2016-02-05 DIAGNOSIS — Z510 Encounter for antineoplastic radiation therapy: Secondary | ICD-10-CM | POA: Diagnosis not present

## 2016-02-11 DIAGNOSIS — Z510 Encounter for antineoplastic radiation therapy: Secondary | ICD-10-CM | POA: Diagnosis not present

## 2016-02-15 DIAGNOSIS — Z510 Encounter for antineoplastic radiation therapy: Secondary | ICD-10-CM | POA: Diagnosis not present

## 2016-02-16 ENCOUNTER — Ambulatory Visit
Admission: RE | Admit: 2016-02-16 | Discharge: 2016-02-16 | Disposition: A | Discharge status: Home / Self Care | Payer: Medicare HMO | Source: Ambulatory Visit | Attending: Radiation Oncology | Admitting: Radiation Oncology

## 2016-02-16 ENCOUNTER — Encounter: Payer: Self-pay | Admitting: Medical Oncology

## 2016-02-16 DIAGNOSIS — Z510 Encounter for antineoplastic radiation therapy: Secondary | ICD-10-CM | POA: Diagnosis not present

## 2016-02-17 ENCOUNTER — Ambulatory Visit
Admission: RE | Admit: 2016-02-17 | Discharge: 2016-02-17 | Disposition: A | Discharge status: Home / Self Care | Payer: Medicare HMO | Source: Ambulatory Visit | Attending: Radiation Oncology | Admitting: Radiation Oncology

## 2016-02-17 DIAGNOSIS — Z510 Encounter for antineoplastic radiation therapy: Secondary | ICD-10-CM | POA: Diagnosis not present

## 2016-02-18 ENCOUNTER — Ambulatory Visit
Admission: RE | Admit: 2016-02-18 | Discharge: 2016-02-18 | Disposition: A | Discharge status: Home / Self Care | Payer: Medicare HMO | Source: Ambulatory Visit | Attending: Radiation Oncology | Admitting: Radiation Oncology

## 2016-02-18 DIAGNOSIS — Z510 Encounter for antineoplastic radiation therapy: Secondary | ICD-10-CM | POA: Diagnosis not present

## 2016-02-19 ENCOUNTER — Ambulatory Visit
Admission: RE | Admit: 2016-02-19 | Discharge: 2016-02-19 | Disposition: A | Discharge status: Home / Self Care | Payer: Medicare HMO | Source: Ambulatory Visit | Attending: Radiation Oncology | Admitting: Radiation Oncology

## 2016-02-19 VITALS — BP 146/79 | HR 64 | Wt 244.8 lb

## 2016-02-19 DIAGNOSIS — C61 Malignant neoplasm of prostate: Secondary | ICD-10-CM

## 2016-02-19 DIAGNOSIS — Z510 Encounter for antineoplastic radiation therapy: Secondary | ICD-10-CM | POA: Diagnosis not present

## 2016-02-19 NOTE — Progress Notes (Signed)
Weight and vitals stable. Denies pain. Reports nocturia x 4. Denies dysuria or hematuria. Describes his urine stream as strong and steady with rare difficulty emptying his bladder. Denies urgency or frequency. Reports on rare occasions he leak urine if he hold his bladder too long. Denies any bowel complaints. Reports mild fatigue.   BP (!) 146/79 (BP Location: Left Arm, Patient Position: Sitting, Cuff Size: Large)   Pulse 64   Wt 244 lb 12.8 oz (111 kg)   BMI 31.43 kg/m  Wt Readings from Last 3 Encounters:  02/19/16 244 lb 12.8 oz (111 kg)  12/28/15 246 lb 6.4 oz (111.8 kg)  06/26/15 240 lb (108.9 kg)

## 2016-02-19 NOTE — Progress Notes (Signed)
  Radiation Oncology         [phone]   Name: [name] MRN: [id number]   Date: 02/19/2016  DOB: 1945/12/18   Weekly Radiation Therapy Management    ICD-9-CM ICD-10-CM   1. Malignant neoplasm of prostate ([facility]) 185 C61     Current Dose: 7.8 Gy  Planned Dose:  78 Gy  Narrative The patient presents for routine under treatment assessment.  Denies pain. Reports nocturia x 4. Denies dysuria or hematuria. Describes his urine stream as strong and steady with rare difficulty emptying his bladder. Denies urgency or frequency. Reports on rare occasions he leak urine if he hold his bladder too long. Denies any bowel complaints. Reports mild fatigue. He reports lower back and leg pain when he gets off the treatment table. He reports some mild testicular pain.  Set-up films were reviewed. The chart was checked.  Physical Findings  weight is 244 lb 12.8 oz (111 kg). His blood pressure is 146/79 (abnormal) and his pulse is 64. . Weight essentially stable.  No significant changes.  Impression The patient is tolerating radiation. I advised the patient to take Tylenol for his pain.  Plan Continue treatment as planned.         Sheral Apley Tammi Klippel, M.D.  This document serves as a record of services personally performed by Tyler Pita, MD. It was created on his behalf by Darcus Austin, a trained medical scribe. The creation of this record is based on the scribe's personal observations and the provider's statements to them. This document has been checked and approved by the attending provider.

## 2016-02-22 ENCOUNTER — Ambulatory Visit
Admission: RE | Admit: 2016-02-22 | Discharge: 2016-02-22 | Disposition: A | Discharge status: Home / Self Care | Payer: Medicare HMO | Source: Ambulatory Visit | Attending: Radiation Oncology | Admitting: Radiation Oncology

## 2016-02-22 DIAGNOSIS — Z510 Encounter for antineoplastic radiation therapy: Secondary | ICD-10-CM | POA: Diagnosis not present

## 2016-02-23 ENCOUNTER — Ambulatory Visit
Admission: RE | Admit: 2016-02-23 | Discharge: 2016-02-23 | Disposition: A | Discharge status: Home / Self Care | Payer: Medicare HMO | Source: Ambulatory Visit | Attending: Radiation Oncology | Admitting: Radiation Oncology

## 2016-02-23 DIAGNOSIS — Z510 Encounter for antineoplastic radiation therapy: Secondary | ICD-10-CM | POA: Diagnosis not present

## 2016-02-24 ENCOUNTER — Ambulatory Visit
Admission: RE | Admit: 2016-02-24 | Discharge: 2016-02-24 | Disposition: A | Discharge status: Home / Self Care | Payer: Medicare HMO | Source: Ambulatory Visit | Attending: Radiation Oncology | Admitting: Radiation Oncology

## 2016-02-24 DIAGNOSIS — Z510 Encounter for antineoplastic radiation therapy: Secondary | ICD-10-CM | POA: Diagnosis not present

## 2016-02-25 ENCOUNTER — Ambulatory Visit
Admission: RE | Admit: 2016-02-25 | Discharge: 2016-02-25 | Disposition: A | Discharge status: Home / Self Care | Payer: Medicare HMO | Source: Ambulatory Visit | Attending: Radiation Oncology | Admitting: Radiation Oncology

## 2016-02-25 ENCOUNTER — Encounter: Payer: Self-pay | Admitting: Radiation Oncology

## 2016-02-25 VITALS — BP 150/80 | HR 64 | Temp 98.6°F | Ht 74.0 in | Wt 254.5 lb

## 2016-02-25 DIAGNOSIS — Z510 Encounter for antineoplastic radiation therapy: Secondary | ICD-10-CM | POA: Diagnosis not present

## 2016-02-25 DIAGNOSIS — C61 Malignant neoplasm of prostate: Secondary | ICD-10-CM

## 2016-02-25 NOTE — Progress Notes (Signed)
  Radiation Oncology         [phone]   Name: [name] MRN: [id number]   Date: 02/25/2016  DOB: 02/25/1946   Weekly Radiation Therapy Management    ICD-9-CM ICD-10-CM   1. Malignant neoplasm of prostate ([facility]) 185 C61     Current Dose: 15.6 Gy  Planned Dose:  78 Gy  Narrative The patient presents for routine under treatment assessment.  [name] presents for his 8th fraction of radiation to his prostate. He denies pain, hematuria, or dysuria. He reports fatigue at times after radiation treatment, urinary frequency which has not changed since starting radiation, nocturia x5-6 which has increased since beginning radiation, and daily normal bowel movements.  Set-up films were reviewed. The chart was checked.  Physical Findings  height is 6\' 2"  (1.88 m) and weight is 254 lb 8 oz (115.4 kg). His temperature is 98.6 F (37 C). His blood pressure is 150/80 (abnormal) and his pulse is 64. His oxygen saturation is 99%. . The patient has gained 10 lbs since 02/19/16. No significant changes.  Impression The patient is tolerating radiation.  Plan Continue treatment as planned. I advised the patient to discontinue drinking water in the evening to reduce nocturia.         Sheral Apley Tammi Klippel, M.D.  This document serves as a record of services personally performed by Tyler Pita, MD. It was created on his behalf by Darcus Austin, a trained medical scribe. The creation of this record is based on the scribe's personal observations and the provider's statements to them. This document has been checked and approved by the attending provider.

## 2016-02-25 NOTE — Progress Notes (Signed)
[name] presents for his 8th fraction of radiation to his Prostate. He denies pain. He reports fatigue at times after radiation treatment. He denies hematuria or burning with urination. He reports urinary frequency which has not changed since starting radiation. He reports urinating 5-6 times during the night, which has increased since beginning radiation. He reports daily normal bowel movements.   BP (!) 150/80   Pulse 64   Temp 98.6 F (37 C)   Ht 6\' 2"  (1.88 m)   Wt 254 lb 8 oz (115.4 kg)   SpO2 99% Comment: room air  BMI 32.68 kg/m    Wt Readings from Last 3 Encounters:  02/25/16 254 lb 8 oz (115.4 kg)  02/19/16 244 lb 12.8 oz (111 kg)  12/28/15 246 lb 6.4 oz (111.8 kg)

## 2016-02-26 ENCOUNTER — Ambulatory Visit
Admission: RE | Admit: 2016-02-26 | Discharge: 2016-02-26 | Disposition: A | Discharge status: Home / Self Care | Payer: Medicare HMO | Source: Ambulatory Visit | Attending: Radiation Oncology | Admitting: Radiation Oncology

## 2016-02-26 DIAGNOSIS — Z510 Encounter for antineoplastic radiation therapy: Secondary | ICD-10-CM | POA: Diagnosis not present

## 2016-02-28 ENCOUNTER — Ambulatory Visit
Admission: RE | Admit: 2016-02-28 | Discharge: 2016-02-28 | Disposition: A | Discharge status: Home / Self Care | Payer: Medicare HMO | Source: Ambulatory Visit | Attending: Radiation Oncology | Admitting: Radiation Oncology

## 2016-02-28 DIAGNOSIS — Z510 Encounter for antineoplastic radiation therapy: Secondary | ICD-10-CM | POA: Diagnosis not present

## 2016-02-29 ENCOUNTER — Ambulatory Visit
Admission: RE | Admit: 2016-02-29 | Discharge: 2016-02-29 | Disposition: A | Discharge status: Home / Self Care | Payer: Medicare HMO | Source: Ambulatory Visit | Attending: Radiation Oncology | Admitting: Radiation Oncology

## 2016-02-29 DIAGNOSIS — Z510 Encounter for antineoplastic radiation therapy: Secondary | ICD-10-CM | POA: Diagnosis not present

## 2016-03-01 ENCOUNTER — Ambulatory Visit
Admission: RE | Admit: 2016-03-01 | Discharge: 2016-03-01 | Disposition: A | Discharge status: Home / Self Care | Payer: Medicare HMO | Source: Ambulatory Visit | Attending: Radiation Oncology | Admitting: Radiation Oncology

## 2016-03-01 DIAGNOSIS — Z510 Encounter for antineoplastic radiation therapy: Secondary | ICD-10-CM | POA: Diagnosis not present

## 2016-03-02 ENCOUNTER — Encounter: Payer: Self-pay | Admitting: Radiation Oncology

## 2016-03-02 ENCOUNTER — Ambulatory Visit
Admission: RE | Admit: 2016-03-02 | Discharge: 2016-03-02 | Disposition: A | Discharge status: Home / Self Care | Payer: Medicare HMO | Source: Ambulatory Visit | Attending: Radiation Oncology | Admitting: Radiation Oncology

## 2016-03-02 VITALS — BP 153/67 | HR 66 | Temp 97.9°F | Ht 74.0 in | Wt 244.0 lb

## 2016-03-02 DIAGNOSIS — C61 Malignant neoplasm of prostate: Secondary | ICD-10-CM

## 2016-03-02 DIAGNOSIS — Z510 Encounter for antineoplastic radiation therapy: Secondary | ICD-10-CM | POA: Diagnosis not present

## 2016-03-02 NOTE — Progress Notes (Signed)
  Radiation Oncology         [phone]   Name: [name] MRN: [id number]   Date: 03/02/2016  DOB: Apr 17, 1945   Weekly Radiation Therapy Management    ICD-9-CM ICD-10-CM   1. Malignant neoplasm of prostate ([facility]) 185 C61     Current Dose: 25.35 Gy  Planned Dose:  78 Gy  Narrative The patient presents for routine under treatment assessment.  [name] has completed 13 fractions to his prostate.  He denies having pain, dysuria orhematuria,   He reports his urinary stream is "slower" and "weaker."  He said he has nocturia 5-7 times.  He reports having occasional diarrhea and said it depends on what he eats.  He reports having fatigue  Set-up films were reviewed. The chart was checked.  Physical Findings  height is 6\' 2"  (1.88 m) and weight is 244 lb (110.7 kg). His oral temperature is 97.9 F (36.6 C). His blood pressure is 153/67 (abnormal) and his pulse is 66. His oxygen saturation is 98%. .  No significant changes.  Impression The patient is tolerating radiation.  Plan Continue treatment as planned.         Sheral Apley Tammi Klippel, M.D.  This document serves as a record of services personally performed by Tyler Pita, MD. It was created on his behalf by Arlyce Harman, a trained medical scribe. The creation of this record is based on the scribe's personal observations and the provider's statements to them. This document has been checked and approved by the attending provider.

## 2016-03-02 NOTE — Progress Notes (Addendum)
[name] has completed 13 fractions to his prostate.  He denies having pain, dysuria orhematuria,   He reports his urinary stream is "slower" and "weaker."  He said he has nocturia 5-7 times.  He reports having occasional diarrhea and said it depends on what he eats.  He reports having fatigue.  BP (!) 153/67 (BP Location: Right Arm, Patient Position: Sitting)   Pulse 66   Temp 97.9 F (36.6 C) (Oral)   Ht 6\' 2"  (1.88 m)   Wt 244 lb (110.7 kg)   SpO2 98%   BMI 31.33 kg/m    Wt Readings from Last 3 Encounters:  03/02/16 244 lb (110.7 kg)  02/25/16 254 lb 8 oz (115.4 kg)  02/19/16 244 lb 12.8 oz (111 kg)

## 2016-03-04 ENCOUNTER — Ambulatory Visit: Payer: Medicare HMO

## 2016-03-07 ENCOUNTER — Ambulatory Visit
Admission: RE | Admit: 2016-03-07 | Discharge: 2016-03-07 | Disposition: A | Discharge status: Home / Self Care | Payer: Medicare HMO | Source: Ambulatory Visit | Attending: Radiation Oncology | Admitting: Radiation Oncology

## 2016-03-07 DIAGNOSIS — Z510 Encounter for antineoplastic radiation therapy: Secondary | ICD-10-CM | POA: Diagnosis not present

## 2016-03-08 ENCOUNTER — Ambulatory Visit
Admission: RE | Admit: 2016-03-08 | Discharge: 2016-03-08 | Disposition: A | Discharge status: Home / Self Care | Payer: Medicare HMO | Source: Ambulatory Visit | Attending: Radiation Oncology | Admitting: Radiation Oncology

## 2016-03-08 DIAGNOSIS — Z510 Encounter for antineoplastic radiation therapy: Secondary | ICD-10-CM | POA: Diagnosis not present

## 2016-03-09 ENCOUNTER — Ambulatory Visit
Admission: RE | Admit: 2016-03-09 | Discharge: 2016-03-09 | Disposition: A | Discharge status: Home / Self Care | Payer: Medicare HMO | Source: Ambulatory Visit | Attending: Radiation Oncology | Admitting: Radiation Oncology

## 2016-03-09 DIAGNOSIS — Z510 Encounter for antineoplastic radiation therapy: Secondary | ICD-10-CM | POA: Diagnosis not present

## 2016-03-10 ENCOUNTER — Ambulatory Visit
Admission: RE | Admit: 2016-03-10 | Discharge: 2016-03-10 | Disposition: A | Discharge status: Home / Self Care | Payer: Medicare HMO | Source: Ambulatory Visit | Attending: Radiation Oncology | Admitting: Radiation Oncology

## 2016-03-10 DIAGNOSIS — Z510 Encounter for antineoplastic radiation therapy: Secondary | ICD-10-CM | POA: Diagnosis not present

## 2016-03-11 ENCOUNTER — Ambulatory Visit
Admission: RE | Admit: 2016-03-11 | Discharge: 2016-03-11 | Disposition: A | Discharge status: Home / Self Care | Payer: Medicare HMO | Source: Ambulatory Visit | Attending: Radiation Oncology | Admitting: Radiation Oncology

## 2016-03-11 ENCOUNTER — Encounter: Payer: Self-pay | Admitting: Radiation Oncology

## 2016-03-11 VITALS — BP 124/72 | HR 73 | Temp 98.1°F | Resp 20 | Wt 245.8 lb

## 2016-03-11 DIAGNOSIS — Z510 Encounter for antineoplastic radiation therapy: Secondary | ICD-10-CM | POA: Diagnosis not present

## 2016-03-11 DIAGNOSIS — C61 Malignant neoplasm of prostate: Secondary | ICD-10-CM

## 2016-03-11 NOTE — Progress Notes (Signed)
  Radiation Oncology         [phone]   Name: [name] MRN: [id number]   Date: 03/11/2016  DOB: [birth year]   Weekly Radiation Therapy Management    ICD-9-CM ICD-10-CM   1. Malignant neoplasm of prostate ([facility]) 185 C61     Current Dose: 35.1 Gy  Planned Dose:  78 Gy  Narrative The patient presents for routine under treatment assessment.  Weekly rad txs prostate 18/40 completed. Denies hematuria or dysuria. Some urgency at times. Has a full stream occasionally, but doesn't feel like he empties completely. Nocturia x4-5, 1-2 bowel movements daily. The bowel movements are regular. Good appetite. Mild fatigue. C/o left side leg/hip aching.  Set-up films were reviewed. The chart was checked.  Physical Findings  weight is 245 lb 12.8 oz (111.5 kg). His oral temperature is 98.1 F (36.7 C). His blood pressure is 124/72 and his pulse is 73. His respiration is 20. Marland Kitchen  No significant changes.  Impression The patient is tolerating radiation.  Plan Continue treatment as planned.         Sheral Apley Tammi Klippel, M.D.  This document serves as a record of services personally performed by Aaron Pita, MD. It was created on his behalf by Darcus Austin, a trained medical scribe. The creation of this record is based on the scribe's personal observations and the provider's statements to them. This document has been checked and approved by the attending provider.

## 2016-03-11 NOTE — Progress Notes (Signed)
Weekly rad txs prostate 18/40 completed,  Denies hematuria,  Comes with full bladder, some urgency at times, no dysuria, has full stream occasionally doesn't feel like he empty's completely,  Nocturia 4-5x, 1-2 bowel movements  Daily, regular , appetite good, mild fatigue, c/o left side leg/hip aching ,  BP 124/72 (BP Location: Left Arm, Patient Position: Sitting, Cuff Size: Normal)   Pulse 73   Temp 98.1 F (36.7 C) (Oral)   Resp 20   Wt 245 lb 12.8 oz (111.5 kg)   BMI 31.56 kg/m   Wt Readings from Last 3 Encounters:  03/11/16 245 lb 12.8 oz (111.5 kg)  03/02/16 244 lb (110.7 kg)  02/25/16 254 lb 8 oz (115.4 kg)

## 2016-03-14 ENCOUNTER — Ambulatory Visit
Admission: RE | Admit: 2016-03-14 | Discharge: 2016-03-14 | Disposition: A | Discharge status: Home / Self Care | Payer: Medicare HMO | Source: Ambulatory Visit | Attending: Radiation Oncology | Admitting: Radiation Oncology

## 2016-03-14 DIAGNOSIS — Z510 Encounter for antineoplastic radiation therapy: Secondary | ICD-10-CM | POA: Diagnosis not present

## 2016-03-15 ENCOUNTER — Ambulatory Visit
Admission: RE | Admit: 2016-03-15 | Discharge: 2016-03-15 | Disposition: A | Discharge status: Home / Self Care | Payer: Medicare HMO | Source: Ambulatory Visit | Attending: Radiation Oncology | Admitting: Radiation Oncology

## 2016-03-15 DIAGNOSIS — G709 Myoneural disorder, unspecified: Secondary | ICD-10-CM | POA: Diagnosis not present

## 2016-03-15 DIAGNOSIS — J45909 Unspecified asthma, uncomplicated: Secondary | ICD-10-CM | POA: Diagnosis not present

## 2016-03-15 DIAGNOSIS — Z87891 Personal history of nicotine dependence: Secondary | ICD-10-CM | POA: Diagnosis not present

## 2016-03-15 DIAGNOSIS — C61 Malignant neoplasm of prostate: Secondary | ICD-10-CM | POA: Diagnosis present

## 2016-03-15 DIAGNOSIS — M1990 Unspecified osteoarthritis, unspecified site: Secondary | ICD-10-CM | POA: Diagnosis not present

## 2016-03-15 DIAGNOSIS — I10 Essential (primary) hypertension: Secondary | ICD-10-CM | POA: Diagnosis not present

## 2016-03-15 DIAGNOSIS — N1330 Unspecified hydronephrosis: Secondary | ICD-10-CM | POA: Diagnosis not present

## 2016-03-15 DIAGNOSIS — K219 Gastro-esophageal reflux disease without esophagitis: Secondary | ICD-10-CM | POA: Diagnosis not present

## 2016-03-15 DIAGNOSIS — Z96652 Presence of left artificial knee joint: Secondary | ICD-10-CM | POA: Diagnosis not present

## 2016-03-15 DIAGNOSIS — I2510 Atherosclerotic heart disease of native coronary artery without angina pectoris: Secondary | ICD-10-CM | POA: Diagnosis not present

## 2016-03-15 DIAGNOSIS — Z510 Encounter for antineoplastic radiation therapy: Secondary | ICD-10-CM | POA: Diagnosis not present

## 2016-03-15 DIAGNOSIS — Z96642 Presence of left artificial hip joint: Secondary | ICD-10-CM | POA: Diagnosis not present

## 2016-03-15 DIAGNOSIS — I252 Old myocardial infarction: Secondary | ICD-10-CM | POA: Diagnosis not present

## 2016-03-16 ENCOUNTER — Ambulatory Visit
Admission: RE | Admit: 2016-03-16 | Discharge: 2016-03-16 | Disposition: A | Discharge status: Home / Self Care | Payer: Medicare HMO | Source: Ambulatory Visit | Attending: Radiation Oncology | Admitting: Radiation Oncology

## 2016-03-16 DIAGNOSIS — Z510 Encounter for antineoplastic radiation therapy: Secondary | ICD-10-CM | POA: Diagnosis not present

## 2016-03-17 ENCOUNTER — Ambulatory Visit
Admission: RE | Admit: 2016-03-17 | Discharge: 2016-03-17 | Disposition: A | Discharge status: Home / Self Care | Payer: Medicare HMO | Source: Ambulatory Visit | Attending: Radiation Oncology | Admitting: Radiation Oncology

## 2016-03-17 DIAGNOSIS — Z510 Encounter for antineoplastic radiation therapy: Secondary | ICD-10-CM | POA: Diagnosis not present

## 2016-03-18 ENCOUNTER — Ambulatory Visit
Admission: RE | Admit: 2016-03-18 | Discharge: 2016-03-18 | Disposition: A | Discharge status: Home / Self Care | Payer: Medicare HMO | Source: Ambulatory Visit | Attending: Radiation Oncology | Admitting: Radiation Oncology

## 2016-03-18 VITALS — BP 157/62 | HR 79 | Resp 18 | Wt 249.2 lb

## 2016-03-18 DIAGNOSIS — Z510 Encounter for antineoplastic radiation therapy: Secondary | ICD-10-CM | POA: Diagnosis not present

## 2016-03-18 DIAGNOSIS — C61 Malignant neoplasm of prostate: Secondary | ICD-10-CM

## 2016-03-18 NOTE — Progress Notes (Signed)
  Radiation Oncology         [phone]   Name: [name] MRN: [id number]   Date: 03/18/2016  DOB: Oct 12, 1945   Weekly Radiation Therapy Management    ICD-9-CM ICD-10-CM   1. Malignant neoplasm of prostate ([facility]) 185 C61     Current Dose: 44.85 Gy  Planned Dose:  78 Gy  Narrative The patient presents for routine under treatment assessment.  Denies pain, hematuria, or dysuria. Reports intermittent urgency continues, a strong steady stream with occasional difficulty emptying, nocturia x 5, fatigue, and on occasion he will experience a a burning sensation in his penis during the night. Reports diarrhea only after drinking egg nog. The patient complains of hot flashes and he is on hormone therapy.  Set-up films were reviewed. The chart was checked.  Physical Findings  weight is 249 lb 3.2 oz (113 kg). His blood pressure is 157/62 (abnormal) and his pulse is 79. His respiration is 18 and oxygen saturation is 100%. .  No significant changes.  Impression The patient is tolerating radiation. The burning sensation of his penis could be from irritation from the radiation and should resolve after radiation is complete.  Plan Continue treatment as planned.         Sheral Apley Tammi Klippel, M.D.  This document serves as a record of services personally performed by Tyler Pita, MD. It was created on his behalf by Darcus Austin, a trained medical scribe. The creation of this record is based on the scribe's personal observations and the provider's statements to them. This document has been checked and approved by the attending provider.

## 2016-03-18 NOTE — Progress Notes (Signed)
Weight and vitals stable. Denies pain. Reports intermittent urgency continues. Denies hematuria. Describes a strong steady stream with occasional difficulty emptying. Reports nocturia x 5. Denies dysuria. Reports on occasion he will experience a during in his penis during the night. Repolrts diarrhea only after drinking egg nog.Reports fatigue.   BP (!) 157/62 (BP Location: Left Arm, Patient Position: Sitting, Cuff Size: Normal)   Pulse 79   Resp 18   Wt 249 lb 3.2 oz (113 kg)   SpO2 100%   BMI 32.00 kg/m  Wt Readings from Last 3 Encounters:  03/18/16 249 lb 3.2 oz (113 kg)  03/11/16 245 lb 12.8 oz (111.5 kg)  03/02/16 244 lb (110.7 kg)

## 2016-03-21 ENCOUNTER — Ambulatory Visit
Admission: RE | Admit: 2016-03-21 | Discharge: 2016-03-21 | Disposition: A | Discharge status: Home / Self Care | Payer: Medicare HMO | Source: Ambulatory Visit | Attending: Radiation Oncology | Admitting: Radiation Oncology

## 2016-03-21 DIAGNOSIS — Z510 Encounter for antineoplastic radiation therapy: Secondary | ICD-10-CM | POA: Diagnosis not present

## 2016-03-22 ENCOUNTER — Ambulatory Visit
Admission: RE | Admit: 2016-03-22 | Discharge: 2016-03-22 | Disposition: A | Discharge status: Home / Self Care | Payer: Medicare HMO | Source: Ambulatory Visit | Attending: Radiation Oncology | Admitting: Radiation Oncology

## 2016-03-22 DIAGNOSIS — Z510 Encounter for antineoplastic radiation therapy: Secondary | ICD-10-CM | POA: Diagnosis not present

## 2016-03-23 ENCOUNTER — Ambulatory Visit
Admission: RE | Admit: 2016-03-23 | Discharge: 2016-03-23 | Disposition: A | Discharge status: Home / Self Care | Payer: Medicare HMO | Source: Ambulatory Visit | Attending: Radiation Oncology | Admitting: Radiation Oncology

## 2016-03-23 DIAGNOSIS — Z510 Encounter for antineoplastic radiation therapy: Secondary | ICD-10-CM | POA: Diagnosis not present

## 2016-03-24 ENCOUNTER — Ambulatory Visit
Admission: RE | Admit: 2016-03-24 | Discharge: 2016-03-24 | Disposition: A | Discharge status: Home / Self Care | Payer: Medicare HMO | Source: Ambulatory Visit | Attending: Radiation Oncology | Admitting: Radiation Oncology

## 2016-03-24 DIAGNOSIS — Z510 Encounter for antineoplastic radiation therapy: Secondary | ICD-10-CM | POA: Diagnosis not present

## 2016-03-25 ENCOUNTER — Encounter: Payer: Self-pay | Admitting: Radiation Oncology

## 2016-03-25 ENCOUNTER — Ambulatory Visit
Admission: RE | Admit: 2016-03-25 | Discharge: 2016-03-25 | Disposition: A | Discharge status: Home / Self Care | Payer: Medicare HMO | Source: Ambulatory Visit | Attending: Radiation Oncology | Admitting: Radiation Oncology

## 2016-03-25 VITALS — BP 147/76 | HR 71 | Temp 98.4°F | Resp 20 | Ht 74.0 in | Wt 243.8 lb

## 2016-03-25 DIAGNOSIS — Z510 Encounter for antineoplastic radiation therapy: Secondary | ICD-10-CM | POA: Diagnosis not present

## 2016-03-25 DIAGNOSIS — C61 Malignant neoplasm of prostate: Secondary | ICD-10-CM

## 2016-03-25 NOTE — Progress Notes (Signed)
  Radiation Oncology         [phone]   Name: [name] MRN: [id number]   Date: 03/25/2016  DOB: 10/21/1945   Weekly Radiation Therapy Management    ICD-9-CM ICD-10-CM   1. Malignant neoplasm of prostate (HCC) 185 C61     Current Dose: 54.6 Gy  Planned Dose:  78 Gy  Narrative The patient presents for routine under treatment assessment.  Weight and vitals stable. Denies pain, hematuria, or dysuria. Reports intermittent urgency continues. Describes a strong steady stream with difficulty emptying bladder completely. Reports nocturia x 5-6. Reports fatigue and hot flashes. Reports that he has to urinate every time he has hot flashes.  Set-up films were reviewed. The chart was checked.  Physical Findings  height is 6\' 2"  (1.88 m) and weight is 243 lb 12.8 oz (110.6 kg). His oral temperature is 98.4 F (36.9 C). His blood pressure is 147/76 (abnormal) and his pulse is 71. His respiration is 20 and oxygen saturation is 99%. .  No significant changes.  Impression The patient is tolerating radiation.  Plan Continue treatment as planned.         Sheral Apley Tammi Klippel, M.D.  This document serves as a record of services personally performed by Tyler Pita, MD. It was created on his behalf by Darcus Austin, a trained medical scribe. The creation of this record is based on the scribe's personal observations and the provider's statements to them. This document has been checked and approved by the attending provider.

## 2016-03-25 NOTE — Progress Notes (Signed)
Weight and vitals stable. Denies pain. Reports intermittent urgency continues. Denies hematuria. Describes a strong steady stream with difficulty emptying bladder completly. Reports nocturia x 5-6. Denies dysuria.  Reports fatigue and hot flashes. Wt Readings from Last 3 Encounters:  03/25/16 243 lb 12.8 oz (110.6 kg)  03/18/16 249 lb 3.2 oz (113 kg)  03/11/16 245 lb 12.8 oz (111.5 kg)  BP (!) 147/76   Pulse 71   Temp 98.4 F (36.9 C) (Oral)   Resp 20   Ht 6\' 2"  (1.88 m)   Wt 243 lb 12.8 oz (110.6 kg)   SpO2 99%   BMI 31.30 kg/m

## 2016-03-28 ENCOUNTER — Ambulatory Visit
Admission: RE | Admit: 2016-03-28 | Discharge: 2016-03-28 | Disposition: A | Discharge status: Home / Self Care | Payer: Medicare HMO | Source: Ambulatory Visit | Attending: Radiation Oncology | Admitting: Radiation Oncology

## 2016-03-28 DIAGNOSIS — R351 Nocturia: Secondary | ICD-10-CM | POA: Insufficient documentation

## 2016-03-28 DIAGNOSIS — R3915 Urgency of urination: Secondary | ICD-10-CM | POA: Diagnosis not present

## 2016-03-28 DIAGNOSIS — Z510 Encounter for antineoplastic radiation therapy: Secondary | ICD-10-CM | POA: Diagnosis not present

## 2016-03-28 DIAGNOSIS — C61 Malignant neoplasm of prostate: Secondary | ICD-10-CM | POA: Insufficient documentation

## 2016-03-29 ENCOUNTER — Ambulatory Visit
Admission: RE | Admit: 2016-03-29 | Discharge: 2016-03-29 | Disposition: A | Discharge status: Home / Self Care | Payer: Medicare HMO | Source: Ambulatory Visit | Attending: Radiation Oncology | Admitting: Radiation Oncology

## 2016-03-29 DIAGNOSIS — Z510 Encounter for antineoplastic radiation therapy: Secondary | ICD-10-CM | POA: Diagnosis not present

## 2016-03-30 ENCOUNTER — Ambulatory Visit
Admission: RE | Admit: 2016-03-30 | Discharge: 2016-03-30 | Disposition: A | Discharge status: Home / Self Care | Payer: Medicare HMO | Source: Ambulatory Visit | Attending: Radiation Oncology | Admitting: Radiation Oncology

## 2016-03-30 DIAGNOSIS — Z510 Encounter for antineoplastic radiation therapy: Secondary | ICD-10-CM | POA: Diagnosis not present

## 2016-03-31 ENCOUNTER — Ambulatory Visit
Admission: RE | Admit: 2016-03-31 | Discharge: 2016-03-31 | Disposition: A | Discharge status: Home / Self Care | Payer: Medicare HMO | Source: Ambulatory Visit | Attending: Radiation Oncology | Admitting: Radiation Oncology

## 2016-03-31 DIAGNOSIS — Z510 Encounter for antineoplastic radiation therapy: Secondary | ICD-10-CM | POA: Diagnosis not present

## 2016-04-01 ENCOUNTER — Ambulatory Visit
Admission: RE | Admit: 2016-04-01 | Discharge: 2016-04-01 | Disposition: A | Discharge status: Home / Self Care | Payer: Medicare HMO | Source: Ambulatory Visit | Attending: Radiation Oncology | Admitting: Radiation Oncology

## 2016-04-01 VITALS — BP 141/71 | HR 68 | Resp 18 | Wt 247.0 lb

## 2016-04-01 DIAGNOSIS — Z510 Encounter for antineoplastic radiation therapy: Secondary | ICD-10-CM | POA: Diagnosis not present

## 2016-04-01 DIAGNOSIS — C61 Malignant neoplasm of prostate: Secondary | ICD-10-CM

## 2016-04-01 NOTE — Progress Notes (Signed)
  Radiation Oncology         [phone]   Name: [name] MRN: [id number]   Date: 04/01/2016  DOB: 18-Nov-1945   Weekly Radiation Therapy Management    ICD-9-CM ICD-10-CM   1. Malignant neoplasm of prostate (HCC) 185 C61     Current Dose: 64.35 Gy  Planned Dose:  78 Gy  Narrative The patient presents for routine under treatment assessment.  Weight and vitals stable. Denies pain. Reports hot flashes related to ADT continue. Reports fatigue. Reports occasional urinary urgency. Describes a strong steady urine stream without difficulty emptying his bladder. Denies leakage or incontinence. Reports nocturia x 5. Denies hematuria or dysuria.  Set-up films were reviewed. The chart was checked.  Physical Findings  weight is 247 lb (112 kg). His blood pressure is 141/71 (abnormal) and his pulse is 68. His respiration is 18. .  No significant changes.  Impression The patient is tolerating radiation.  Plan Continue treatment as planned.         Sheral Apley Tammi Klippel, M.D.  This document serves as a record of services personally performed by Tyler Pita, MD and Shona Simpson, PA. It was created on his behalf by Maryla Morrow, a trained medical scribe. The creation of this record is based on the scribe's personal observations and the provider's statements to them. This document has been checked and approved by the attending provider.

## 2016-04-01 NOTE — Progress Notes (Signed)
Weight and vitals stable. Denies pain. Reports hot flashes related to ADT continue. Reports fatigue. Reports occasional urinary urgency. Describes a strong steady urine stream without difficulty emptying his bladder. Denies leakage or incontinence. Reports nocturia x 5. Denies hematuria or dysuria.  BP (!) 141/71   Pulse 68   Resp 18   Wt 247 lb (112 kg)   BMI 31.71 kg/m  Wt Readings from Last 3 Encounters:  04/01/16 247 lb (112 kg)  03/25/16 243 lb 12.8 oz (110.6 kg)  03/18/16 249 lb 3.2 oz (113 kg)

## 2016-04-05 ENCOUNTER — Ambulatory Visit
Admission: RE | Admit: 2016-04-05 | Discharge: 2016-04-05 | Disposition: A | Discharge status: Home / Self Care | Payer: Medicare HMO | Source: Ambulatory Visit | Attending: Radiation Oncology | Admitting: Radiation Oncology

## 2016-04-05 DIAGNOSIS — Z510 Encounter for antineoplastic radiation therapy: Secondary | ICD-10-CM | POA: Diagnosis not present

## 2016-04-06 ENCOUNTER — Ambulatory Visit
Admission: RE | Admit: 2016-04-06 | Discharge: 2016-04-06 | Disposition: A | Discharge status: Home / Self Care | Payer: Medicare HMO | Source: Ambulatory Visit | Attending: Radiation Oncology | Admitting: Radiation Oncology

## 2016-04-06 DIAGNOSIS — Z510 Encounter for antineoplastic radiation therapy: Secondary | ICD-10-CM | POA: Diagnosis not present

## 2016-04-07 ENCOUNTER — Ambulatory Visit
Admission: RE | Admit: 2016-04-07 | Discharge: 2016-04-07 | Disposition: A | Discharge status: Home / Self Care | Payer: Medicare HMO | Source: Ambulatory Visit | Attending: Radiation Oncology | Admitting: Radiation Oncology

## 2016-04-07 DIAGNOSIS — Z510 Encounter for antineoplastic radiation therapy: Secondary | ICD-10-CM | POA: Diagnosis not present

## 2016-04-08 ENCOUNTER — Ambulatory Visit
Admission: RE | Admit: 2016-04-08 | Discharge: 2016-04-08 | Disposition: A | Discharge status: Home / Self Care | Payer: Medicare HMO | Source: Ambulatory Visit | Attending: Radiation Oncology | Admitting: Radiation Oncology

## 2016-04-08 VITALS — BP 144/55 | HR 66 | Resp 18 | Wt 252.8 lb

## 2016-04-08 DIAGNOSIS — C61 Malignant neoplasm of prostate: Secondary | ICD-10-CM

## 2016-04-08 DIAGNOSIS — Z510 Encounter for antineoplastic radiation therapy: Secondary | ICD-10-CM | POA: Diagnosis not present

## 2016-04-08 NOTE — Progress Notes (Signed)
  Radiation Oncology         [phone]   Name: [name] MRN: [id number]   Date: 04/08/2016  DOB: November 09, 1945   Weekly Radiation Therapy Management    ICD-9-CM ICD-10-CM   1. Malignant neoplasm of prostate (HCC) 185 C61     Current Dose: 72.15 Gy  Planned Dose:  78 Gy  Narrative The patient presents for routine under treatment assessment.  Weight and vitals stable. Denies pain. Reports increased nocturia. Reports nocturia x 10 last night. Reports urine stream seems weaker. Denies dysuria, hematuria, leakage or incontinence. Reports continued urinary urgency. Reports continues hot flashes related to effects of hot flashes. Denies any bowel complaints.   Set-up films were reviewed. The chart was checked.  Physical Findings  weight is 252 lb 12.8 oz (114.7 kg). His blood pressure is 144/55 (abnormal) and his pulse is 66. His respiration is 18 and oxygen saturation is 100%. .  No significant changes.  Impression The patient is tolerating radiation.  Plan Continue treatment as planned. The patient will return for follow up in 1 month. He knows to contact us in the interim with any questions or concerns that may arise.        ------------------------------------------------  Jodelle Gross, MD, PhD  This document serves as a record of services personally performed by Kyung Rudd, MD. It was created on his behalf by Maryla Morrow, a trained medical scribe. The creation of this record is based on the scribe's personal observations and the provider's statements to them. This document has been checked and approved by the attending provider.

## 2016-04-08 NOTE — Progress Notes (Addendum)
Weight and vitals stable. Denies pain. Reports increased nocturia. Reports nocturia x 10 last night. Reports urine stream seems weaker. Denies dysuria, hematuria, leakage or incontinence. Reports continued urinary urgency. Reports continues hot flashes related to effects of hot flashes. Denies any bowel complaints. One moth follow up appointment card given.  BP (!) 144/55 (BP Location: Right Arm, Patient Position: Sitting, Cuff Size: Large)   Pulse 66   Resp 18   Wt 252 lb 12.8 oz (114.7 kg)   SpO2 100%   BMI 32.46 kg/m  Wt Readings from Last 3 Encounters:  04/08/16 252 lb 12.8 oz (114.7 kg)  04/01/16 247 lb (112 kg)  03/25/16 243 lb 12.8 oz (110.6 kg)

## 2016-04-12 ENCOUNTER — Ambulatory Visit
Admission: RE | Admit: 2016-04-12 | Discharge: 2016-04-12 | Disposition: A | Discharge status: Home / Self Care | Payer: Medicare HMO | Source: Ambulatory Visit | Attending: Radiation Oncology | Admitting: Radiation Oncology

## 2016-04-12 DIAGNOSIS — Z510 Encounter for antineoplastic radiation therapy: Secondary | ICD-10-CM | POA: Diagnosis not present

## 2016-04-12 DIAGNOSIS — R351 Nocturia: Secondary | ICD-10-CM | POA: Diagnosis not present

## 2016-04-12 DIAGNOSIS — C61 Malignant neoplasm of prostate: Secondary | ICD-10-CM | POA: Diagnosis not present

## 2016-04-12 DIAGNOSIS — R3915 Urgency of urination: Secondary | ICD-10-CM | POA: Diagnosis not present

## 2016-04-13 ENCOUNTER — Ambulatory Visit
Admission: RE | Admit: 2016-04-13 | Discharge: 2016-04-13 | Disposition: A | Discharge status: Home / Self Care | Payer: Medicare HMO | Source: Ambulatory Visit | Attending: Radiation Oncology | Admitting: Radiation Oncology

## 2016-04-13 DIAGNOSIS — Z510 Encounter for antineoplastic radiation therapy: Secondary | ICD-10-CM | POA: Diagnosis not present

## 2016-04-14 ENCOUNTER — Ambulatory Visit
Admission: RE | Admit: 2016-04-14 | Discharge: 2016-04-14 | Disposition: A | Discharge status: Home / Self Care | Payer: Medicare HMO | Source: Ambulatory Visit | Attending: Radiation Oncology | Admitting: Radiation Oncology

## 2016-04-14 ENCOUNTER — Encounter: Payer: Self-pay | Admitting: Medical Oncology

## 2016-04-14 DIAGNOSIS — Z510 Encounter for antineoplastic radiation therapy: Secondary | ICD-10-CM | POA: Diagnosis not present

## 2016-04-15 ENCOUNTER — Encounter: Payer: Self-pay | Admitting: Radiation Oncology

## 2016-04-15 NOTE — Progress Notes (Signed)
  Radiation Oncology         ([phone]) [phone] ________________________________  Name: [name] MRN: [id number]  Date: 04/15/2016  DOB: [birth year]  End of Treatment Note  Diagnosis:   Malignant neoplasm of prostate     Indication for treatment:  Curative, Definitive Radiotherapy       Radiation treatment dates:   02/16/16 - 04/14/16  Site/dose:   The prostate was treated to 78 Gy in 40 fractions of 1.95 Gy  Beams/energy:   The patient was treated with IMRT using volumetric arc therapy delivering 6 MV X-rays to clockwise and counterclockwise circumferential arcs with a 90 degree collimator offset to avoid dose scalloping.  Image guidance was performed with daily cone beam CT prior to each fraction to align to gold markers in the prostate and assure proper bladder and rectal fill volumes.  Immobilization was achieved with BodyFix custom mold.  Narrative: The patient tolerated radiation treatment relatively well.   The patient experienced some minor urinary irritation and modest fatigue. He also experienced hot flashes due to related effects of hormone injections.  Plan: The patient has completed radiation treatment. He will return to radiation oncology clinic for routine followup in one month. I advised him to call or return sooner if he has any questions or concerns related to his recovery or treatment. ________________________________  Sheral Apley. Tammi Klippel, M.D.  This document serves as a record of services personally performed by Tyler Pita, MD. It was created on his behalf by Maryla Morrow, a trained medical scribe. The creation of this record is based on the scribe's personal observations and the provider's statements to them. This document has been checked and approved by the attending provider.

## 2016-06-09 NOTE — Progress Notes (Signed)
[name] 71 y.o. man with Malignant neoplasm of prostate radiation completed 04-14-16 one month FU.  Pain: He is currently     URINARY: He denies reports urinary frequency,urinary urgency,having incontinence and dribbling,hematuria, it is hard to postpone urination,has a stop and start pattern,hesistency, urinary retention.  Reports having incomplete emptying of his bladder or stress incontinence. Pt states he gets up to urinate    times per night. BOWEL: He reports a  bowel movement everyday/everyother day  diarrhea, normal bowel movements Fatigue: Appetite: Weight: Urologist: Lupron injection every  months: Last  dose                                                              PSA:

## 2016-06-13 ENCOUNTER — Ambulatory Visit: Payer: Medicare HMO | Admitting: Radiation Oncology

## 2016-06-13 ENCOUNTER — Ambulatory Visit
Admission: RE | Admit: 2016-06-13 | Discharge: 2016-06-13 | Disposition: A | Discharge status: Home / Self Care | Payer: Medicare HMO | Source: Ambulatory Visit | Attending: Radiation Oncology | Admitting: Radiation Oncology

## 2016-06-13 ENCOUNTER — Encounter: Payer: Self-pay | Admitting: Radiation Oncology

## 2016-06-13 VITALS — BP 160/89 | HR 74 | Temp 99.6°F | Resp 20 | Ht 74.0 in | Wt 254.6 lb

## 2016-06-13 DIAGNOSIS — Z79899 Other long term (current) drug therapy: Secondary | ICD-10-CM | POA: Diagnosis not present

## 2016-06-13 DIAGNOSIS — Z7982 Long term (current) use of aspirin: Secondary | ICD-10-CM | POA: Diagnosis not present

## 2016-06-13 DIAGNOSIS — Z91013 Allergy to seafood: Secondary | ICD-10-CM | POA: Diagnosis not present

## 2016-06-13 DIAGNOSIS — Z923 Personal history of irradiation: Secondary | ICD-10-CM | POA: Diagnosis not present

## 2016-06-13 DIAGNOSIS — C61 Malignant neoplasm of prostate: Secondary | ICD-10-CM | POA: Diagnosis not present

## 2016-06-13 DIAGNOSIS — Z7984 Long term (current) use of oral hypoglycemic drugs: Secondary | ICD-10-CM | POA: Diagnosis not present

## 2016-06-13 NOTE — Progress Notes (Addendum)
[name] 71 y.o. man with Malignant neoplasm of prostate radiation completed 04-14-16 one month FU.  Pain: He is currently denies having pain.    URINARY: He denies reports urinary frequency,urinary.  Denies hematuria.    Reports he is emptying his bladder. Pt states he gets up to urinate 4-6  times per night. BOWEL: He reports a  bowel movement everyday normal bowel movements.  Denies diarrhea. Fatigue:Having fatigue with SOB with hot flashes at times. Appetite:Good eating three meals per day. Weight:  Wt Readings from Last 3 Encounters:  06/13/16 254 lb 9.6 oz (115.5 kg)  04/08/16 252 lb 12.8 oz (114.7 kg)  04/01/16 247 lb (112 kg)  : Urologist:Dr. Junious Silk Lupron injection every  months: Last  dose   Had on injection after biopsy 12-04-15                                                           PSA:  BP (!) 182/84   Pulse 72   Temp 99.6 F (37.6 C) (Oral)   Resp 20   Ht 6\' 2"  (1.88 m)   Wt 254 lb 9.6 oz (115.5 kg)   SpO2 98%   BMI 32.69 kg/m Right arm BP (!) 160/89   Pulse 74   Temp 99.6 F (37.6 C) (Oral)   Resp 20   Ht 6\' 2"  (1.88 m)   Wt 254 lb 9.6 oz (115.5 kg)   SpO2 98%   BMI 32.69 kg/m  Right arm

## 2016-06-14 ENCOUNTER — Ambulatory Visit
Admission: RE | Admit: 2016-06-14 | Discharge: 2016-06-14 | Disposition: A | Discharge status: Home / Self Care | Payer: Medicare HMO | Source: Ambulatory Visit | Attending: Radiation Oncology | Admitting: Radiation Oncology

## 2016-06-14 ENCOUNTER — Ambulatory Visit: Admission: RE | Admit: 2016-06-14 | Payer: Medicare HMO | Source: Ambulatory Visit | Admitting: Radiation Oncology

## 2016-06-16 NOTE — Progress Notes (Signed)
Radiation Oncology         (336) [phone] ________________________________  Name: [name] MRN: [id number]  Date: 06/13/2016  DOB: 03-31-1946  Post Treatment Note  CC: Patricia Nettle, MD  Wallene Huh, MD  Diagnosis:  71 y.o. gentleman with intermediate risk T1c adenocarcinoma of the prostate with a Gleason Score of 3+4, and a PSA of 9.18.  Interval Since Last Radiation:  8 weeks   02/16/16 - 04/14/16: The prostate was treated to 78 Gy in 40 fractions of 1.95 Gy  Narrative:  The patient returns today for routine follow-up.  The patient has done well in tolerating radiotherapy without significant complaints. He received his last Lupron injection in August 2017.                             On review of systems, the patient states he is doing well. He continues to note episodes of nocturia and occasional frequency. He does note hot flashes and sweats particularly at night. He is not interested in taking any medication for this at this time. No other complaints are noted.  ALLERGIES:  is allergic to shellfish allergy.  Meds: Current Outpatient Prescriptions  Medication Sig Dispense Refill  . amLODipine (NORVASC) 10 MG tablet Take 10 mg by mouth daily.    Marland Kitchen aspirin 325 MG tablet Take 325 mg by mouth daily.    Marland Kitchen atorvastatin (LIPITOR) 20 MG tablet Take 20 mg by mouth daily.    . carvedilol (COREG) 25 MG tablet Take 25 mg by mouth 2 (two) times daily with a meal.    . colchicine 0.6 MG tablet Take 0.6 mg by mouth 2 (two) times daily.  6  . glimepiride (AMARYL) 4 MG tablet Take 4 mg by mouth daily before breakfast.    . metFORMIN (GLUCOPHAGE) 500 MG tablet Take 500 mg by mouth 3 (three) times daily. Pt takes 1 tablet twice daily and 1/2 tablet at lunch    . valsartan-hydrochlorothiazide (DIOVAN-HCT) 320-12.5 MG per tablet Take 1 tablet by mouth every morning.    Marland Kitchen ofloxacin (OCUFLOX) 0.3 % ophthalmic solution Place 1 drop into both eyes 2 (two) times daily.  0  . PROLENSA 0.07 %  SOLN Place 1 drop into both eyes every morning.  1   No current facility-administered medications for this encounter.     Physical Findings:  height is 6\' 2"  (1.88 m) and weight is 254 lb 9.6 oz (115.5 kg). His oral temperature is 99.6 F (37.6 C). His blood pressure is 160/89 (abnormal) and his pulse is 74. His respiration is 20 and oxygen saturation is 98%.  Pain Assessment Pain Score: 0-No pain/10 In general this is a well appearing African American male in no acute distress. He's alert and oriented x4 and appropriate throughout the examination. Cardiopulmonary assessment is negative for acute distress and he exhibits normal effort.   Lab Findings: Lab Results  Component Value Date   WBC 4.5 06/18/2015   HGB 12.9 (L) 06/18/2015   HCT 38.7 (L) 06/18/2015   MCV 93.3 06/18/2015   PLT 388 06/18/2015     Radiographic Findings: No results found.  Impression/Plan: 1. 71 y.o. gentleman with intermediate risk T1c adenocarcinoma of the prostate with a Gleason Score of 3+4, and a PSA of 9.18. The patient appears to be doing well overall since completing his radiotherapy. He does have some anticipated side effects of ADT. He will follow up with Dr. Junious Silk in  about 1 month, and discuss this further. Hopefully he does not require any additional ADT based on his original staging. We would be happy to see him back if he has any questions or concerns, but will continue surveillance following treatment with urology.     Carola Rhine, PAC

## 2016-06-24 ENCOUNTER — Emergency Department (HOSPITAL_COMMUNITY): Payer: Medicare HMO

## 2016-06-24 ENCOUNTER — Observation Stay (HOSPITAL_COMMUNITY)
Admission: EM | Admit: 2016-06-24 | Discharge: 2016-06-26 | Disposition: A | Discharge status: Home / Self Care | Payer: Medicare HMO | Attending: Internal Medicine | Admitting: Internal Medicine

## 2016-06-24 ENCOUNTER — Encounter (HOSPITAL_COMMUNITY): Payer: Self-pay | Admitting: Emergency Medicine

## 2016-06-24 ENCOUNTER — Observation Stay (HOSPITAL_COMMUNITY): Payer: Medicare HMO

## 2016-06-24 DIAGNOSIS — M1990 Unspecified osteoarthritis, unspecified site: Secondary | ICD-10-CM | POA: Diagnosis not present

## 2016-06-24 DIAGNOSIS — Z7982 Long term (current) use of aspirin: Secondary | ICD-10-CM | POA: Diagnosis not present

## 2016-06-24 DIAGNOSIS — Z96643 Presence of artificial hip joint, bilateral: Secondary | ICD-10-CM | POA: Diagnosis not present

## 2016-06-24 DIAGNOSIS — E1169 Type 2 diabetes mellitus with other specified complication: Secondary | ICD-10-CM

## 2016-06-24 DIAGNOSIS — Z91013 Allergy to seafood: Secondary | ICD-10-CM | POA: Diagnosis not present

## 2016-06-24 DIAGNOSIS — E1165 Type 2 diabetes mellitus with hyperglycemia: Secondary | ICD-10-CM | POA: Diagnosis not present

## 2016-06-24 DIAGNOSIS — D500 Iron deficiency anemia secondary to blood loss (chronic): Secondary | ICD-10-CM | POA: Diagnosis not present

## 2016-06-24 DIAGNOSIS — I10 Essential (primary) hypertension: Secondary | ICD-10-CM

## 2016-06-24 DIAGNOSIS — J45909 Unspecified asthma, uncomplicated: Secondary | ICD-10-CM

## 2016-06-24 DIAGNOSIS — I25118 Atherosclerotic heart disease of native coronary artery with other forms of angina pectoris: Principal | ICD-10-CM | POA: Insufficient documentation

## 2016-06-24 DIAGNOSIS — R0789 Other chest pain: Secondary | ICD-10-CM | POA: Diagnosis not present

## 2016-06-24 DIAGNOSIS — E119 Type 2 diabetes mellitus without complications: Secondary | ICD-10-CM

## 2016-06-24 DIAGNOSIS — I209 Angina pectoris, unspecified: Secondary | ICD-10-CM

## 2016-06-24 DIAGNOSIS — I252 Old myocardial infarction: Secondary | ICD-10-CM | POA: Insufficient documentation

## 2016-06-24 DIAGNOSIS — E1122 Type 2 diabetes mellitus with diabetic chronic kidney disease: Secondary | ICD-10-CM

## 2016-06-24 DIAGNOSIS — R079 Chest pain, unspecified: Secondary | ICD-10-CM | POA: Diagnosis present

## 2016-06-24 DIAGNOSIS — K219 Gastro-esophageal reflux disease without esophagitis: Secondary | ICD-10-CM | POA: Insufficient documentation

## 2016-06-24 DIAGNOSIS — Z8546 Personal history of malignant neoplasm of prostate: Secondary | ICD-10-CM | POA: Insufficient documentation

## 2016-06-24 DIAGNOSIS — Z96652 Presence of left artificial knee joint: Secondary | ICD-10-CM | POA: Diagnosis not present

## 2016-06-24 DIAGNOSIS — Z79899 Other long term (current) drug therapy: Secondary | ICD-10-CM | POA: Diagnosis not present

## 2016-06-24 DIAGNOSIS — E785 Hyperlipidemia, unspecified: Secondary | ICD-10-CM | POA: Diagnosis not present

## 2016-06-24 DIAGNOSIS — I2510 Atherosclerotic heart disease of native coronary artery without angina pectoris: Secondary | ICD-10-CM

## 2016-06-24 DIAGNOSIS — Z923 Personal history of irradiation: Secondary | ICD-10-CM | POA: Insufficient documentation

## 2016-06-24 DIAGNOSIS — Z7984 Long term (current) use of oral hypoglycemic drugs: Secondary | ICD-10-CM | POA: Insufficient documentation

## 2016-06-24 DIAGNOSIS — Z794 Long term (current) use of insulin: Secondary | ICD-10-CM

## 2016-06-24 DIAGNOSIS — Z87891 Personal history of nicotine dependence: Secondary | ICD-10-CM | POA: Diagnosis not present

## 2016-06-24 DIAGNOSIS — R42 Dizziness and giddiness: Secondary | ICD-10-CM | POA: Diagnosis not present

## 2016-06-24 DIAGNOSIS — C61 Malignant neoplasm of prostate: Secondary | ICD-10-CM | POA: Diagnosis present

## 2016-06-24 DIAGNOSIS — E11649 Type 2 diabetes mellitus with hypoglycemia without coma: Secondary | ICD-10-CM

## 2016-06-24 LAB — URINALYSIS, ROUTINE W REFLEX MICROSCOPIC
Bacteria, UA: NONE SEEN
Bilirubin Urine: NEGATIVE
Glucose, UA: >=500 mg/dL — AB
Hgb urine dipstick: NEGATIVE
Ketones, ur: NEGATIVE mg/dL
Leukocytes, UA: NEGATIVE
Nitrite: NEGATIVE
Protein, ur: NEGATIVE mg/dL
Specific Gravity, Urine: 1.016 (ref 1.005–1.030)
Squamous Epithelial / LPF: NONE SEEN
pH: 6 (ref 5.0–8.0)

## 2016-06-24 LAB — CBC WITH DIFFERENTIAL/PLATELET
Basophils Absolute: 0 10*3/uL (ref 0.0–0.1)
Basophils Relative: 1 %
Eosinophils Absolute: 0.2 10*3/uL (ref 0.0–0.7)
Eosinophils Relative: 6 %
HCT: 25.6 % — ABNORMAL LOW (ref 39.0–52.0)
Hemoglobin: 8.9 g/dL — ABNORMAL LOW (ref 13.0–17.0)
Lymphocytes Relative: 22 %
Lymphs Abs: 0.9 10*3/uL (ref 0.7–4.0)
MCH: 31.3 pg (ref 26.0–34.0)
MCHC: 34.8 g/dL (ref 30.0–36.0)
MCV: 90.1 fL (ref 78.0–100.0)
Monocytes Absolute: 0.4 10*3/uL (ref 0.1–1.0)
Monocytes Relative: 9 %
Neutro Abs: 2.5 10*3/uL (ref 1.7–7.7)
Neutrophils Relative %: 62 %
Platelets: 283 10*3/uL (ref 150–400)
RBC: 2.84 MIL/uL — ABNORMAL LOW (ref 4.22–5.81)
RDW: 13.4 % (ref 11.5–15.5)
WBC: 4 10*3/uL (ref 4.0–10.5)

## 2016-06-24 LAB — IRON AND TIBC
Iron: 87 ug/dL (ref 45–182)
SATURATION RATIOS: 33 % (ref 17.9–39.5)
TIBC: 262 ug/dL (ref 250–450)
UIBC: 175 ug/dL

## 2016-06-24 LAB — BASIC METABOLIC PANEL
Anion gap: 6 (ref 5–15)
BUN: 23 mg/dL — ABNORMAL HIGH (ref 6–20)
CO2: 26 mmol/L (ref 22–32)
Calcium: 9.3 mg/dL (ref 8.9–10.3)
Chloride: 106 mmol/L (ref 101–111)
Creatinine, Ser: 1.08 mg/dL (ref 0.61–1.24)
GFR calc Af Amer: >60 mL/min (ref >60)
GFR calc non Af Amer: >60 mL/min (ref >60)
Glucose, Bld: 269 mg/dL — ABNORMAL HIGH (ref 65–99)
Potassium: 4.6 mmol/L (ref 3.5–5.1)
Sodium: 138 mmol/L (ref 135–145)

## 2016-06-24 LAB — TROPONIN I: Troponin I: 0.03 ng/mL (ref <0.03)

## 2016-06-24 LAB — RETICULOCYTES
RBC.: 2.78 MIL/uL — AB (ref 4.22–5.81)
RETIC CT PCT: 1.9 % (ref 0.4–3.1)
Retic Count, Absolute: 52.8 10*3/uL (ref 19.0–186.0)

## 2016-06-24 LAB — MRSA PCR SCREENING: MRSA by PCR: NEGATIVE

## 2016-06-24 LAB — VITAMIN B12: VITAMIN B 12: 325 pg/mL (ref 180–914)

## 2016-06-24 LAB — HEPARIN LEVEL (UNFRACTIONATED): Heparin Unfractionated: 0.31 IU/mL (ref 0.30–0.70)

## 2016-06-24 LAB — TSH: TSH: 1.307 u[IU]/mL (ref 0.350–4.500)

## 2016-06-24 LAB — FERRITIN: Ferritin: 131 ng/mL (ref 24–336)

## 2016-06-24 LAB — FOLATE: Folate: 23.5 ng/mL (ref >5.9)

## 2016-06-24 MED ORDER — GI COCKTAIL ~~LOC~~
30.0000 mL | Freq: Three times a day (TID) | ORAL | Status: DC | PRN
  Administered 2016-06-24: 30 mL via ORAL
  Filled 2016-06-24: qty 30

## 2016-06-24 MED ORDER — ASPIRIN EC 81 MG PO TBEC
81.0000 mg | DELAYED_RELEASE_TABLET | Freq: Every day | ORAL | Status: DC
  Administered 2016-06-24 – 2016-06-26 (×2): 81 mg via ORAL
  Filled 2016-06-24 (×2): qty 1

## 2016-06-24 MED ORDER — SODIUM CHLORIDE 0.9 % IV SOLN
INTRAVENOUS | Status: DC
  Administered 2016-06-24 – 2016-06-25 (×3): via INTRAVENOUS

## 2016-06-24 MED ORDER — HEPARIN (PORCINE) IN NACL 100-0.45 UNIT/ML-% IJ SOLN
1300.0000 [IU]/h | INTRAMUSCULAR | Status: DC
  Administered 2016-06-24: 1300 [IU]/h via INTRAVENOUS
  Filled 2016-06-24: qty 250

## 2016-06-24 MED ORDER — ATORVASTATIN CALCIUM 10 MG PO TABS
20.0000 mg | ORAL_TABLET | Freq: Every day | ORAL | Status: DC
  Administered 2016-06-24 – 2016-06-25 (×2): 20 mg via ORAL
  Filled 2016-06-24 (×2): qty 2

## 2016-06-24 MED ORDER — CARVEDILOL 12.5 MG PO TABS
25.0000 mg | ORAL_TABLET | Freq: Two times a day (BID) | ORAL | Status: DC
  Administered 2016-06-24 – 2016-06-26 (×3): 25 mg via ORAL
  Filled 2016-06-24 (×3): qty 2

## 2016-06-24 MED ORDER — PANTOPRAZOLE SODIUM 40 MG PO TBEC
40.0000 mg | DELAYED_RELEASE_TABLET | Freq: Every day | ORAL | Status: DC
  Administered 2016-06-24: 40 mg via ORAL
  Filled 2016-06-24: qty 1

## 2016-06-24 MED ORDER — HEPARIN (PORCINE) IN NACL 100-0.45 UNIT/ML-% IJ SOLN
1550.0000 [IU]/h | INTRAMUSCULAR | Status: DC
  Administered 2016-06-25: 1350 [IU]/h via INTRAVENOUS
  Filled 2016-06-24 (×3): qty 250

## 2016-06-24 MED ORDER — ENOXAPARIN SODIUM 40 MG/0.4ML ~~LOC~~ SOLN: 40.0000 mg | SUBCUTANEOUS | Status: DC

## 2016-06-24 MED ORDER — ASPIRIN 325 MG PO TABS: 325.0000 mg | ORAL_TABLET | Freq: Every day | ORAL | Status: DC

## 2016-06-24 MED ORDER — SODIUM CHLORIDE 0.9% FLUSH
3.0000 mL | Freq: Two times a day (BID) | INTRAVENOUS | Status: DC
  Administered 2016-06-24 – 2016-06-25 (×2): 3 mL via INTRAVENOUS
  Administered 2016-06-26: 10 mL via INTRAVENOUS

## 2016-06-24 MED ORDER — NITROGLYCERIN IN D5W 200-5 MCG/ML-% IV SOLN
2.0000 ug/min | INTRAVENOUS | Status: DC
  Administered 2016-06-24: 5 ug/min via INTRAVENOUS
  Filled 2016-06-24: qty 250

## 2016-06-24 MED ORDER — HEPARIN BOLUS VIA INFUSION
4000.0000 [IU] | Freq: Once | INTRAVENOUS | Status: AC
  Administered 2016-06-24: 4000 [IU] via INTRAVENOUS
  Filled 2016-06-24: qty 4000

## 2016-06-24 NOTE — ED Triage Notes (Signed)
Patient reports that this morning while he was up getting ready for the day he got dizzy,. Patient reports that he gets indigestion when he goes up stairs and gets a lot of gas.

## 2016-06-24 NOTE — Progress Notes (Signed)
  Echocardiogram 2D Echocardiogram has been performed.  Tresa Res 06/24/2016, 12:45 PM

## 2016-06-24 NOTE — Consult Note (Signed)
Reason for Consult:chest pain Referring Physician: [facility] hospitalist  [name] is an 71 y.o. male.  JQB:HALPFXT is 71 year old male with past medical history significant for coronary artery disease, history of MI in 1985, hypertension, diabetesmellitus,hyperlipidemia, GERD, history of bronchial asthma, history of carcinoma of prostate, was admitted earlier this morning because of retrosternal burning chest pain with exertion.  States while climbing steps or exercising and [employer] develops burning chest pain associated with shortness of breath, relieved with rest, off and on for the last 2 weeks.  States also had an episode of diaphoresis 3 days ago. Complains of occasional dizziness Denies any palpitation, lightheadedness or syncope.  Denies PND, orthopnea leg swelling.  Denies any recent cardiac workup.  Patient states he has been taking Indocin for gout last 2 months.  EKG done in the ED showed nonspecific ST-T wave changes in inferior leads and was noted to have minimally elevated troponin I of 0.03 and significant drop in hemoglobin to 8.9 from prior hemoglobin.  Past Medical History:  Diagnosis Date  . Arthritis   . Asthma    no inhaler  . Coronary artery disease    cardiologist-  dr spruill; last visit 3 mos ago per pt  . GERD (gastroesophageal reflux disease)   . History of MI (myocardial infarction)    1985  . Hydronephrosis, left   . Hypertension   . Myocardial infarction   . Nephrolithiasis    left  . 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 Presence of tooth-root and mandibular implants    lower dental implants  . Prostate cancer ([place])   . Shortness of breath    WITH EXERTION  . Type 2 diabetes mellitus ([place])     Past Surgical History:  Procedure Laterality Date  . BUNIONECTOMY    . CYSTOSCOPY W/ RETROGRADES Left 03/14/2014   Procedure: CYSTOSCOPY WITH LEFT RETROGRADE PYELOGRAM, Left Ureteroscopy, [place] ureteral Stent No string;  Surgeon: [name], MD;  Location: [facility];  Service: Urology;  Laterality: Left;  . HERNIA REPAIR    . PROSTATE BIOPSY    . TOTAL HIP ARTHROPLASTY Left 03-20-2009  . TOTAL HIP ARTHROPLASTY  04/09/2012   Procedure: TOTAL HIP ARTHROPLASTY;  Surgeon: [name], MD;  Location: [facility];  Service: Orthopedics;  Laterality: Right;  . TOTAL KNEE ARTHROPLASTY Left 05-16-2008    Family History  Problem Relation Age of Onset  . Cancer Mother     breast  . Multiple sclerosis Daughter   . Cancer Father     prostate  . Cancer Maternal Uncle     bone    Social History:  reports that he quit smoking about 32 years ago. His smoking use included Cigarettes. He has a 25.00 pack-year smoking history. He quit smokeless tobacco use about 31 years ago. He reports that he drinks alcohol. He reports that he does not use drugs.  Allergies:  Allergies  Allergen Reactions  . Shellfish Allergy Rash    Medications: I have reviewed the patient's current medications.  Results for orders placed or performed during the hospital encounter of 06/24/16 (from the past 48 hour(s))  Basic metabolic panel     Status: Abnormal   Collection Time: 06/24/16  8:10 AM  Result Value Ref Range   Sodium 138 135 - 145 mmol/L   Potassium 4.6 3.5 - 5.1 mmol/L   Chloride 106 101 - 111 mmol/L   CO2 26 22 - 32 mmol/L  Glucose, Bld 269 (H) 65 - 99 mg/dL   BUN 23 (H) 6 - 20 mg/dL   Creatinine, Ser 1.08 0.61 - 1.24 mg/dL   Calcium 9.3 8.9 - 10.3 mg/dL   GFR calc non Af Amer >60 >60 mL/min   GFR calc Af Amer >60 >60 mL/min    Comment: (NOTE) The eGFR has been calculated using the CKD EPI equation. This calculation has not been validated in all clinical situations. eGFR's persistently <60 mL/min signify possible Chronic Kidney Disease.    Anion gap 6 5 - 15  CBC with Differential     Status: Abnormal   Collection Time: 06/24/16  8:10 AM  Result Value Ref Range   WBC 4.0 4.0 - 10.5 K/uL   RBC 2.84 (L) 4.22 -  5.81 MIL/uL   Hemoglobin 8.9 (L) 13.0 - 17.0 g/dL   HCT 25.6 (L) 39.0 - 52.0 %   MCV 90.1 78.0 - 100.0 fL   MCH 31.3 26.0 - 34.0 pg   MCHC 34.8 30.0 - 36.0 g/dL   RDW 13.4 11.5 - 15.5 %   Platelets 283 150 - 400 K/uL   Neutrophils Relative % 62 %   Neutro Abs 2.5 1.7 - 7.7 K/uL   Lymphocytes Relative 22 %   Lymphs Abs 0.9 0.7 - 4.0 K/uL   Monocytes Relative 9 %   Monocytes Absolute 0.4 0.1 - 1.0 K/uL   Eosinophils Relative 6 %   Eosinophils Absolute 0.2 0.0 - 0.7 K/uL   Basophils Relative 1 %   Basophils Absolute 0.0 0.0 - 0.1 K/uL  Troponin I     Status: Abnormal   Collection Time: 06/24/16  8:10 AM  Result Value Ref Range   Troponin I 0.03 (HH) <0.03 ng/mL    Comment: CRITICAL RESULT CALLED TO, READ BACK BY AND VERIFIED WITH: KALLAM,L. RN AT 0177 06/24/16 MULLINS,T   Vitamin B12     Status: None   Collection Time: 06/24/16  8:10 AM  Result Value Ref Range   Vitamin B-12 325 180 - 914 pg/mL    Comment: (NOTE) This assay is not validated for testing neonatal or myeloproliferative syndrome specimens for Vitamin B12 levels. Performed at [facility], [place] [address], [place], [place] [address]   Iron and TIBC     Status: None   Collection Time: 06/24/16  8:10 AM  Result Value Ref Range   Iron 87 45 - 182 ug/dL   TIBC 262 250 - 450 ug/dL   Saturation Ratios 33 17.9 - 39.5 %   UIBC 175 ug/dL    Comment: Performed at [facility], [place] [address] [place], [place] [address]  Ferritin     Status: None   Collection Time: 06/24/16  8:10 AM  Result Value Ref Range   Ferritin 131 24 - 336 ng/mL    Comment: Performed at [facility], [place] [address], [place], [place] [address]  Reticulocytes     Status: Abnormal   Collection Time: 06/24/16  8:10 AM  Result Value Ref Range   Retic Ct Pct 1.9 0.4 - 3.1 %   RBC. 2.78 (L) 4.22 - 5.81 MIL/uL   Retic Count, Manual 52.8 19.0 - 186.0 K/uL  Folate     Status: None   Collection Time: 06/24/16  8:10 AM  Result Value  Ref Range   Folate 23.5 >5.9 ng/mL    Comment: Performed at [facility], [place] [address], [place], [place] [address]  Urinalysis, Routine w reflex microscopic     Status: Abnormal   Collection Time: 06/24/16  9:15 AM  Result Value Ref Range   Color, Urine STRAW (A) YELLOW   APPearance CLEAR CLEAR   Specific Gravity, Urine 1.016 1.005 - 1.030   pH 6.0 5.0 - 8.0   Glucose, UA >=500 (A) NEGATIVE mg/dL   Hgb urine dipstick NEGATIVE NEGATIVE   Bilirubin Urine NEGATIVE NEGATIVE   Ketones, ur NEGATIVE NEGATIVE mg/dL   Protein, ur NEGATIVE NEGATIVE mg/dL   Nitrite NEGATIVE NEGATIVE   Leukocytes, UA NEGATIVE NEGATIVE   RBC / HPF 0-5 0 - 5 RBC/hpf   WBC, UA 0-5 0 - 5 WBC/hpf   Bacteria, UA NONE SEEN NONE SEEN   Squamous Epithelial / LPF NONE SEEN NONE SEEN   Mucous PRESENT    Hyaline Casts, UA PRESENT     Dg Chest 2 View  Result Date: 06/24/2016 CLINICAL DATA:  Short of breath and cough for 2 weeks.  Indigestion. EXAM: CHEST  2 VIEW COMPARISON:  10/02/2012 FINDINGS: The heart is moderately enlarged. Normal vascularity. Clear lungs. No pneumothorax or pleural effusion. IMPRESSION: No active cardiopulmonary disease. Electronically Signed   By: Marybelle Killings M.D.   On: 06/24/2016 08:59    Review of Systems  Constitutional: Positive for diaphoresis. Negative for fever.  HENT: Negative for hearing loss and nosebleeds.   Eyes: Negative for blurred vision.  Respiratory: Positive for shortness of breath. Negative for cough.   Cardiovascular: Positive for chest pain. Negative for palpitations and claudication.  Gastrointestinal: Negative for abdominal pain, blood in stool, nausea and vomiting.  Genitourinary: Negative for dysuria.  Neurological: Positive for dizziness.   Blood pressure (!) 154/79, pulse 74, temperature 98.2 F (36.8 C), temperature source Oral, resp. rate 20, height '6\' 2"'  (1.88 m), weight 251 lb 5.2 oz (114 kg), SpO2 95 %. Physical Exam  Constitutional: He is oriented to  person, place, and time.  HENT:  Head: Normocephalic and atraumatic.  Eyes: Conjunctivae are normal. Pupils are equal, round, and reactive to light. Left eye exhibits no discharge. No scleral icterus.  Neck: Normal range of motion. Neck supple. No JVD present. No tracheal deviation present. No thyromegaly present.  Cardiovascular: Normal rate and regular rhythm.   Murmur (soft systolic murmur noted.  No S3 gallop) heard. Respiratory: Effort normal. No respiratory distress. He has no wheezes. He has no rales.  GI: Soft. Bowel sounds are normal. He exhibits no distension. There is tenderness (Mild epigastric tenderness noted). There is no rebound and no guarding.  Musculoskeletal: He exhibits no edema, tenderness or deformity.  Neurological: He is alert and oriented to person, place, and time.    Assessment/Plan: Acute coronary syndrome. Coronary artery disease, history of MI in the past. Hypertension. Diabetes mellitus. Hyperlipidemia. Degenerative joint disease. Acute on chronic anemia, rule out GI loss. GERD. History of carcinoma of prostate. History of bronchial asthma. Plan Check serial enzymes and EKG, lipid panel Check 2-D echo Reduce aspirin to 81 mg daily. Anemia workup per primary team. Check labs in a.m. Discussed with patient regarding various options of treatment, I.e., invasive versus noninvasive stress testing first.  His risk and benefits and agrees for nuclear stress testing first.  Charolette Forward 06/24/2016, 1:42 PM

## 2016-06-24 NOTE — Progress Notes (Signed)
Pharmacy - IV heparin  Assessment:    Please see note from Arlyn Dunning, PharmD earlier today for full details.  Briefly, 71 y.o. male on IV heparin for ACS   Most recent heparin level therapeutic at 0.31 on 1300 units/hr  Noted level timed for 1800 was drawn at 1630  Plan:   Increase heparin to 1350 units/hr to ensure remains therapeutic  Recheck heparin level with AM labs  Reuel Boom, PharmD, BCPS Pager: [phone] 06/24/2016, 5:17 PM

## 2016-06-24 NOTE — ED Notes (Signed)
Critical lab value given to PA Gerald Stabs)

## 2016-06-24 NOTE — ED Notes (Addendum)
This writer unable to provide enough blood for tube to run test. RN have been made aware

## 2016-06-24 NOTE — H&P (Signed)
History and Physical    [name] [id number]:[id number] DOB: 22-Nov-1945 DOA: 06/24/2016  PCP: Patricia Nettle, MD  Patient coming from: Home.   I have personally briefly reviewed patient's old medical records in [place]  Chief Complaint: burning sensation in the chest since two weeks.   HPI: [name] is a 71 y.o. male with medical history significant of hypertension, MI in 1980's, CAD, GERD,  Asthma, type 2 DM, presents to ED from home with complaints of substernal burning on exertion since two weeks. Patient reports when he goes up the stairs or does exertion like walking, he has burning sensation in the substernal area, and resolves after resting. He denies any chest pain , sob or cough, fever or chills,. He reports some nausea and diaphoresis but not sure if it comes with exertion. He also reports symptoms first started when he had some spaghetti. Currently he is chest pain free. He denies PND or orthopnea, or syncopal episodes. He denies headache, blurry vision. He reports some dizziness earlier this morning .  On arrival to ED, he underwent lab work significant for hemoglobin of 8.9 and troponin of 0.03. EKG does nto show any significant ischemic changes. He was referred to medical service for admission.   Review of Systems: As per HPI otherwise 10 point review of systems negative.    Past Medical History:  Diagnosis Date  . Arthritis   . Asthma    no inhaler  . Coronary artery disease    cardiologist-  dr spruill; last visit 3 mos ago per pt  . GERD (gastroesophageal reflux disease)   . History of MI (myocardial infarction)    1985  . Hydronephrosis, left   . Hypertension   . Myocardial infarction   . Nephrolithiasis    left  . 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 Presence of tooth-root and mandibular implants    lower dental implants  . Prostate cancer ([place])   . Shortness of breath    WITH EXERTION  . Type 2 diabetes mellitus ([place])      Past Surgical History:  Procedure Laterality Date  . BUNIONECTOMY    . CYSTOSCOPY W/ RETROGRADES Left 03/14/2014   Procedure: CYSTOSCOPY WITH LEFT RETROGRADE PYELOGRAM, Left Ureteroscopy, [place] ureteral Stent No string;  Surgeon: [name], MD;  Location: [facility];  Service: Urology;  Laterality: Left;  . HERNIA REPAIR    . PROSTATE BIOPSY    . TOTAL HIP ARTHROPLASTY Left 03-20-2009  . TOTAL HIP ARTHROPLASTY  04/09/2012   Procedure: TOTAL HIP ARTHROPLASTY;  Surgeon: [name], MD;  Location: [facility];  Service: Orthopedics;  Laterality: Right;  . TOTAL KNEE ARTHROPLASTY Left 05-16-2008     reports that he quit smoking about 32 years ago. His smoking use included Cigarettes. He has a 25.00 pack-year smoking history. He quit smokeless tobacco use about 31 years ago. He reports that he drinks alcohol. He reports that he does not use drugs.  Allergies  Allergen Reactions  . Shellfish Allergy Rash    Family History  Problem Relation Age of Onset  . Cancer Mother     breast  . Multiple sclerosis Daughter   . Cancer Father     prostate  . Cancer Maternal Uncle     bone   reviewed.   Prior to Admission medications   Medication Sig Start Date End Date Taking? Authorizing Provider  amLODipine (NORVASC) 10 MG tablet Take  10 mg by mouth daily.   Yes Historical Provider, MD  aspirin 325 MG tablet Take 325 mg by mouth daily.   Yes Historical Provider, MD  atorvastatin (LIPITOR) 20 MG tablet Take 20 mg by mouth daily.   Yes Historical Provider, MD  calcium-vitamin D (OSCAL WITH D) 500-200 MG-UNIT tablet Take 1 tablet by mouth daily with breakfast.   Yes Historical Provider, MD  carvedilol (COREG) 25 MG tablet Take 25 mg by mouth 2 (two) times daily with a meal.   Yes Historical Provider, MD  co-enzyme Q-10 30 MG capsule Take 30 mg by mouth daily.   Yes Historical Provider, MD  glimepiride (AMARYL) 4 MG tablet Take 4 mg by mouth daily before breakfast.   Yes  Historical Provider, MD  indomethacin (INDOCIN) 25 MG capsule Take 25 mg by mouth 2 (two) times daily with a meal.   Yes Historical Provider, MD  metFORMIN (GLUCOPHAGE) 500 MG tablet Take 250-500 mg by mouth 3 (three) times daily. Pt takes 1 tablet in the morning 1/2 at lunch and 1 tablet in the afternoon   Yes Historical Provider, MD  Multiple Vitamin (MULTIVITAMIN WITH MINERALS) TABS tablet Take 1 tablet by mouth daily.   Yes Historical Provider, MD  valsartan-hydrochlorothiazide (DIOVAN-HCT) 320-12.5 MG per tablet Take 1 tablet by mouth every morning.   Yes Historical Provider, MD    Physical Exam: Vitals:   06/24/16 0719 06/24/16 0721 06/24/16 1007  BP:  130/68 (!) 115/59  Pulse: 75 78 72  Resp:  16 16  Temp:  97.8 F (36.6 C)   TempSrc:  Oral   SpO2:  100% 98%    Constitutional: NAD, calm, comfortable Vitals:   06/24/16 0719 06/24/16 0721 06/24/16 1007  BP:  130/68 (!) 115/59  Pulse: 75 78 72  Resp:  16 16  Temp:  97.8 F (36.6 C)   TempSrc:  Oral   SpO2:  100% 98%   Eyes: PERRL, lids and conjunctivae normal ENMT: Mucous membranes are moist. Posterior pharynx clear of any exudate or lesions.Normal dentition.  Neck: normal, supple, no masses, no thyromegaly Respiratory: clear to auscultation bilaterally, no wheezing, no crackles. Normal respiratory effort. No accessory muscle use.  Cardiovascular: Regular rate and rhythm, no murmurs / rubs / gallops. No extremity edema. 2+ pedal pulses. No carotid bruits.  Abdomen: no tenderness, no masses palpated. No hepatosplenomegaly. Bowel sounds positive.  Musculoskeletal: no clubbing / cyanosis. No joint deformity upper and lower extremities. Good ROM, no contractures. Normal muscle tone.  Skin: no rashes, lesions, ulcers. No induration Neurologic: CN 2-12 grossly intact. Sensation intact, DTR normal. Strength 5/5 in all 4.  Psychiatric: Normal judgment and insight. Alert and oriented x 3. Normal mood.     Labs on Admission: I  have personally reviewed following labs and imaging studies  CBC:  Recent Labs Lab 06/24/16 0810  WBC 4.0  NEUTROABS 2.5  HGB 8.9*  HCT 25.6*  MCV 90.1  PLT 627   Basic Metabolic Panel:  Recent Labs Lab 06/24/16 0810  NA 138  K 4.6  CL 106  CO2 26  GLUCOSE 269*  BUN 23*  CREATININE 1.08  CALCIUM 9.3   GFR: Estimated Creatinine Clearance: 86 mL/min (by C-G formula based on SCr of 1.08 mg/dL). Liver Function Tests: No results for input(s): AST, ALT, ALKPHOS, BILITOT, PROT, ALBUMIN in the last 168 hours. No results for input(s): LIPASE, AMYLASE in the last 168 hours. No results for input(s): AMMONIA in the last 168 hours. Coagulation  Profile: No results for input(s): INR, PROTIME in the last 168 hours. Cardiac Enzymes:  Recent Labs Lab 06/24/16 0810  TROPONINI 0.03*   BNP (last 3 results) No results for input(s): PROBNP in the last 8760 hours. HbA1C: No results for input(s): HGBA1C in the last 72 hours. CBG: No results for input(s): GLUCAP in the last 168 hours. Lipid Profile: No results for input(s): CHOL, HDL, LDLCALC, TRIG, CHOLHDL, LDLDIRECT in the last 72 hours. Thyroid Function Tests: No results for input(s): TSH, T4TOTAL, FREET4, T3FREE, THYROIDAB in the last 72 hours. Anemia Panel: No results for input(s): VITAMINB12, FOLATE, FERRITIN, TIBC, IRON, RETICCTPCT in the last 72 hours. Urine analysis:    Component Value Date/Time   COLORURINE STRAW (A) 06/24/2016 0915   APPEARANCEUR CLEAR 06/24/2016 0915   LABSPEC 1.016 06/24/2016 0915   PHURINE 6.0 06/24/2016 0915   GLUCOSEU >=500 (A) 06/24/2016 0915   HGBUR NEGATIVE 06/24/2016 0915   BILIRUBINUR NEGATIVE 06/24/2016 0915   KETONESUR NEGATIVE 06/24/2016 0915   PROTEINUR NEGATIVE 06/24/2016 0915   UROBILINOGEN 0.2 10/24/2014 1035   NITRITE NEGATIVE 06/24/2016 0915   LEUKOCYTESUR NEGATIVE 06/24/2016 0915    Radiological Exams on Admission: Dg Chest 2 View  Result Date: 06/24/2016 CLINICAL DATA:   Short of breath and cough for 2 weeks.  Indigestion. EXAM: CHEST  2 VIEW COMPARISON:  10/02/2012 FINDINGS: The heart is moderately enlarged. Normal vascularity. Clear lungs. No pneumothorax or pleural effusion. IMPRESSION: No active cardiopulmonary disease. Electronically Signed   By: Marybelle Killings M.D.   On: 06/24/2016 08:59    EKG: Independently reviewed.   Assessment/Plan Active Problems:   Chest pain   Chest pain: has both typical and atypical symptoms.  Admit to telemetry to rule out ACS.  Serial troponins and repeat EKG in am.  Echocardiogram ordered. Cardiology consulted  And Dr [name] will see the patient in consult.  Heparin and nitro gtt ordered.  Meanwhile, gi cocktail and oral protonix ordered.   Prostate Cancer: completed radiation treatment, follows up with Dr Junious Silk.   Diabetes mellitus:  hgba1c ordered. SSI.  Holding oral meds at this time.    Hypertension:  Sub optimal. Resume coreg.   H/O CAD;  Get lipid panel. Resume aspirin 325 mg daily.   Anemia: normocytic. Baseline hemoglobin around 12. His hemoglobin is 8.9 today.  Anemia panel will be sent and stool for occult blood will be ordered.  Monitor counts.     DVT prophylaxis: heparin gtt Code Status: full code.  Family Communication: family at bedside.  Disposition Plan: pending further eval.  Consults called: Dr [name] Admission status: obs tele   Shaliah Wann MD [facility] Pager [phone]   If 7PM-7AM, please contact night-coverage [email or web address] Password [facility]  06/24/2016, 10:34 AM

## 2016-06-24 NOTE — Progress Notes (Signed)
ANTICOAGULATION CONSULT NOTE - Initial Consult  Pharmacy Consult for Heparin Indication: chest pain/ACS  Allergies  Allergen Reactions  . Shellfish Allergy Rash    Patient Measurements:   Heparin Dosing Weight: 106.6 kg  Vital Signs: Temp: 97.8 F (36.6 C) (03/16 0721) Temp Source: Oral (03/16 0721) BP: 115/59 (03/16 1007) Pulse Rate: 72 (03/16 1007)  Labs:  Recent Labs  06/24/16 0810  HGB 8.9*  HCT 25.6*  PLT 283  CREATININE 1.08  TROPONINI 0.03*    Estimated Creatinine Clearance: 86 mL/min (by C-G formula based on SCr of 1.08 mg/dL).   Medical History: Past Medical History:  Diagnosis Date  . Arthritis   . Asthma    no inhaler  . Coronary artery disease    cardiologist-  dr spruill; last visit 3 mos ago per pt  . GERD (gastroesophageal reflux disease)   . History of MI (myocardial infarction)    1985  . Hydronephrosis, left   . Hypertension   . Myocardial infarction   . Nephrolithiasis    left  . 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 Presence of tooth-root and mandibular implants    lower dental implants  . Prostate cancer ([place])   . Shortness of breath    WITH EXERTION  . Type 2 diabetes mellitus (HCC)     Medications:  Scheduled:  . pantoprazole  40 mg Oral Q0600   Infusions:  . sodium chloride    . nitroGLYCERIN      Assessment: 71 yo male presented to ER with burning in chest to start IV heparin per pharmacy dosing for possible ACS. Baseline labs drawn.   Goal of Therapy:  Heparin level 0.3-0.7 units/ml Monitor platelets by anticoagulation protocol: Yes   Plan:  1) IV heparin 4000 unit bolus then 2) IV heparin infusion rate of 1300 units/hr 3) Check heparin level 6 hours after start of IV heparin 4) Daily heparin level and CBC   Adrian Saran, PharmD, BCPS Pager [phone] 06/24/2016 11:08 AM

## 2016-06-25 ENCOUNTER — Ambulatory Visit (HOSPITAL_COMMUNITY)
Admit: 2016-06-25 | Discharge: 2016-06-25 | Disposition: A | Discharge status: Home / Self Care | Payer: Medicare HMO | Attending: Cardiology | Admitting: Cardiology

## 2016-06-25 DIAGNOSIS — E119 Type 2 diabetes mellitus without complications: Secondary | ICD-10-CM | POA: Diagnosis not present

## 2016-06-25 DIAGNOSIS — Z794 Long term (current) use of insulin: Secondary | ICD-10-CM

## 2016-06-25 DIAGNOSIS — R079 Chest pain, unspecified: Secondary | ICD-10-CM

## 2016-06-25 DIAGNOSIS — I10 Essential (primary) hypertension: Secondary | ICD-10-CM | POA: Diagnosis not present

## 2016-06-25 LAB — LIPID PANEL
Cholesterol: 186 mg/dL (ref 0–200)
HDL: 36 mg/dL — AB (ref >40)
LDL Cholesterol: 82 mg/dL (ref 0–99)
Total CHOL/HDL Ratio: 5.2 RATIO
Triglycerides: 338 mg/dL — ABNORMAL HIGH (ref <150)
VLDL: 68 mg/dL — ABNORMAL HIGH (ref 0–40)

## 2016-06-25 LAB — CBC
HEMATOCRIT: 23.5 % — AB (ref 39.0–52.0)
Hemoglobin: 8.4 g/dL — ABNORMAL LOW (ref 13.0–17.0)
MCH: 32.9 pg (ref 26.0–34.0)
MCHC: 35.7 g/dL (ref 30.0–36.0)
MCV: 92.2 fL (ref 78.0–100.0)
Platelets: 262 10*3/uL (ref 150–400)
RBC: 2.55 MIL/uL — AB (ref 4.22–5.81)
RDW: 13.5 % (ref 11.5–15.5)
WBC: 4.6 10*3/uL (ref 4.0–10.5)

## 2016-06-25 LAB — HEPARIN LEVEL (UNFRACTIONATED): Heparin Unfractionated: 0.21 IU/mL — ABNORMAL LOW (ref 0.30–0.70)

## 2016-06-25 LAB — TROPONIN I: Troponin I: <0.03 ng/mL (ref <0.03)

## 2016-06-25 LAB — GLUCOSE, CAPILLARY
GLUCOSE-CAPILLARY: 171 mg/dL — AB (ref 65–99)
Glucose-Capillary: 148 mg/dL — ABNORMAL HIGH (ref 65–99)

## 2016-06-25 LAB — HEMOGLOBIN A1C
Hgb A1c MFr Bld: 8.7 % — ABNORMAL HIGH (ref 4.8–5.6)
Mean Plasma Glucose: 203 mg/dL

## 2016-06-25 LAB — ECHOCARDIOGRAM COMPLETE

## 2016-06-25 MED ORDER — INSULIN ASPART 100 UNIT/ML ~~LOC~~ SOLN
0.0000 [IU] | Freq: Three times a day (TID) | SUBCUTANEOUS | Status: DC
  Administered 2016-06-25: 2 [IU] via SUBCUTANEOUS
  Administered 2016-06-25: 1 [IU] via SUBCUTANEOUS
  Administered 2016-06-26 (×2): 2 [IU] via SUBCUTANEOUS

## 2016-06-25 MED ORDER — NITROGLYCERIN 0.4 MG/HR TD PT24
0.4000 mg | MEDICATED_PATCH | Freq: Every day | TRANSDERMAL | Status: DC
  Administered 2016-06-25 – 2016-06-26 (×2): 0.4 mg via TRANSDERMAL
  Filled 2016-06-25 (×2): qty 1

## 2016-06-25 MED ORDER — TECHNETIUM TC 99M TETROFOSMIN IV KIT
30.0000 | PACK | Freq: Once | INTRAVENOUS | Status: AC | PRN
  Administered 2016-06-25: 30 via INTRAVENOUS

## 2016-06-25 MED ORDER — REGADENOSON 0.4 MG/5ML IV SOLN
INTRAVENOUS | Status: AC
  Filled 2016-06-25: qty 5

## 2016-06-25 MED ORDER — AMLODIPINE BESYLATE 5 MG PO TABS
5.0000 mg | ORAL_TABLET | Freq: Every day | ORAL | Status: DC
  Administered 2016-06-26: 5 mg via ORAL
  Filled 2016-06-25: qty 1

## 2016-06-25 MED ORDER — REGADENOSON 0.4 MG/5ML IV SOLN
0.4000 mg | Freq: Once | INTRAVENOUS | Status: AC
  Administered 2016-06-25: 0.4 mg via INTRAVENOUS

## 2016-06-25 MED ORDER — TECHNETIUM TC 99M TETROFOSMIN IV KIT
10.0000 | PACK | Freq: Once | INTRAVENOUS | Status: AC | PRN
  Administered 2016-06-25: 10 via INTRAVENOUS

## 2016-06-25 MED ORDER — PANTOPRAZOLE SODIUM 40 MG IV SOLR
40.0000 mg | Freq: Two times a day (BID) | INTRAVENOUS | Status: DC
  Administered 2016-06-25 – 2016-06-26 (×2): 40 mg via INTRAVENOUS
  Filled 2016-06-25 (×2): qty 40

## 2016-06-25 NOTE — Care Management Obs Status (Signed)
[facility] NOTIFICATION   Patient Details  Name: MONTAY VANVOORHIS MRN: [id number] Date of Birth: 1945/12/01   Medicare Observation Status Notification Given:  Yes    Erenest Rasher, RN 06/25/2016, 6:22 PM

## 2016-06-25 NOTE — Progress Notes (Signed)
[facility] PROGRESS NOTE  [name] YYQ:[id number] DOB: 03-Feb-1946 DOA: 06/24/2016 PCP: Patricia Nettle, MD  Assessment/Plan: 71 y/o male with PMH of HTN, CAD MI (1985), HPL, DM presented with substernal chest pain for two weeks.   Chest pain r/o ACS unstable angina. On iv heparin, NTG, aspirin, BB, statin,  scheduled for stress test per cardiology.   HTN. Resume coreg, amlodipine, prn hydralazine  Anemia. No s/s of acute bleeding. Check iron profile. Check stool occult blood  DM. ha1c-8.7. On metformin, glimepiride at home. monitor on ISS for now GERD. Started PPI  Code Status: full Family Communication: d/w patient, rn (indicate person spoken with, relationship, and if by phone, the number) Disposition Plan: home 24-48 hrs. Pend further evaluation    Consultants:  Cardiology   Procedures:  Pend echo, stress test   Antibiotics:  none (indicate start date, and stop date if known)  HPI/Subjective: Alert, no acute pains   Objective: Vitals:   06/25/16 0730 06/25/16 0800  BP: (!) 154/66   Pulse: 70 62  Resp: (!) 22 19  Temp:      Intake/Output Summary (Last 24 hours) at 06/25/16 0808 Last data filed at 06/25/16 0800  Gross per 24 hour  Intake          1670.38 ml  Output              850 ml  Net           820.38 ml   Filed Weights   06/24/16 1322  Weight: 114 kg (251 lb 5.2 oz)    Exam:   General:  No distress  Cardiovascular: s1,s2 rrr  Respiratory: CTA BL  Abdomen: soft, nt, nd   Musculoskeletal: no leg edema   Data Reviewed: Basic Metabolic Panel:  Recent Labs Lab 06/24/16 0810  NA 138  K 4.6  CL 106  CO2 26  GLUCOSE 269*  BUN 23*  CREATININE 1.08  CALCIUM 9.3   Liver Function Tests: No results for input(s): AST, ALT, ALKPHOS, BILITOT, PROT, ALBUMIN in the last 168 hours. No results for input(s): LIPASE, AMYLASE in the last 168 hours. No results for input(s): AMMONIA in the last 168 hours. CBC:  Recent Labs Lab  06/24/16 0810 06/25/16 0201  WBC 4.0 4.6  NEUTROABS 2.5  --   HGB 8.9* 8.4*  HCT 25.6* 23.5*  MCV 90.1 92.2  PLT 283 262   Cardiac Enzymes:  Recent Labs Lab 06/24/16 0810 06/24/16 1404 06/24/16 2022 06/25/16 0201  TROPONINI 0.03* <0.03 <0.03 <0.03   BNP (last 3 results) No results for input(s): BNP in the last 8760 hours.  ProBNP (last 3 results) No results for input(s): PROBNP in the last 8760 hours.  CBG: No results for input(s): GLUCAP in the last 168 hours.  Recent Results (from the past 240 hour(s))  MRSA PCR Screening     Status: None   Collection Time: 06/24/16  7:14 AM  Result Value Ref Range Status   MRSA by PCR NEGATIVE NEGATIVE Final    Comment:        The GeneXpert MRSA Assay (FDA approved for NASAL specimens only), is one component of a comprehensive MRSA colonization surveillance program. It is not intended to diagnose MRSA infection nor to guide or monitor treatment for MRSA infections.      Studies: Dg Chest 2 View  Result Date: 06/24/2016 CLINICAL DATA:  Short of breath and cough for 2 weeks.  Indigestion. EXAM: CHEST  2 VIEW COMPARISON:  10/02/2012 FINDINGS: The heart is moderately enlarged. Normal vascularity. Clear lungs. No pneumothorax or pleural effusion. IMPRESSION: No active cardiopulmonary disease. Electronically Signed   By: Marybelle Killings M.D.   On: 06/24/2016 08:59    Scheduled Meds: . aspirin EC  81 mg Oral Daily  . atorvastatin  20 mg Oral q1800  . carvedilol  25 mg Oral BID WC  . pantoprazole  40 mg Oral Q0600  . sodium chloride flush  3 mL Intravenous Q12H   Continuous Infusions: . sodium chloride 75 mL/hr at 06/25/16 0800  . heparin 1,550 Units/hr (06/25/16 0800)  . nitroGLYCERIN 10 mcg/min (06/25/16 0800)    Active Problems:   Malignant neoplasm of prostate (HCC)   Chest pain   Type 2 diabetes mellitus without complication, with long-term current use of insulin (HCC)   Essential hypertension   CAD (coronary artery  disease)   Asthma    Time spent: >35 minutes     Kinnie Feil  [facility] Pager [phone]. If 7PM-7AM, please contact night-coverage at [email or web address], password [facility] 06/25/2016, 8:08 AM  LOS: 0 days

## 2016-06-25 NOTE — Progress Notes (Signed)
Subjective:  Patient denies any further chest pain or burning. Denies shortness of breath. 3 sets of cardiac enzymes are negative. Tolerated stress portion of the nuclear study okay Lexiscan Myoview results are pending  Objective:  Vital Signs in the last 24 hours: Temp:  [98.2 F (36.8 C)-98.6 F (37 C)] 98.2 F (36.8 C) (03/16 2000) Pulse Rate:  [57-83] 75 (03/17 1056) Resp:  [0-25] 18 (03/17 0815) BP: (119-172)/(45-114) 172/85 (03/17 1056) SpO2:  [92 %-99 %] 96 % (03/17 0815) Weight:  [251 lb 5.2 oz (114 kg)] 251 lb 5.2 oz (114 kg) (03/16 1322)  Intake/Output from previous day: 03/16 0701 - 03/17 0700 In: 1387.5 [I.V.:1387.5] Out: 850 [Urine:850] Intake/Output from this shift: Total I/O In: 532.4 [I.V.:532.4] Out: 300 [Urine:300]  Physical Exam: Neck: no adenopathy, no carotid bruit, no JVD and supple, symmetrical, trachea midline Lungs: clear to auscultation bilaterally Heart: regular rate and rhythm, S1, S2 normal and Soft systolic murmur noted Abdomen: soft, non-tender; bowel sounds normal; no masses,  no organomegaly Extremities: extremities normal, atraumatic, no cyanosis or edema  Lab Results:  Recent Labs  06/24/16 0810 06/25/16 0201  WBC 4.0 4.6  HGB 8.9* 8.4*  PLT 283 262    Recent Labs  06/24/16 0810  NA 138  K 4.6  CL 106  CO2 26  GLUCOSE 269*  BUN 23*  CREATININE 1.08    Recent Labs  06/25/16 0201 06/25/16 0745  TROPONINI <0.03 <0.03   Hepatic Function Panel No results for input(s): PROT, ALBUMIN, AST, ALT, ALKPHOS, BILITOT, BILIDIR, IBILI in the last 72 hours.  Recent Labs  06/25/16 0201  CHOL 186   No results for input(s): PROTIME in the last 72 hours.  Imaging: Imaging results have been reviewed and Dg Chest 2 View  Result Date: 06/24/2016 CLINICAL DATA:  Short of breath and cough for 2 weeks.  Indigestion. EXAM: CHEST  2 VIEW COMPARISON:  10/02/2012 FINDINGS: The heart is moderately enlarged. Normal vascularity. Clear lungs.  No pneumothorax or pleural effusion. IMPRESSION: No active cardiopulmonary disease. Electronically Signed   By: Marybelle Killings M.D.   On: 06/24/2016 08:59    Cardiac Studies:  Assessment/Plan:  Stable angina MI ruled out Coronary artery disease, history of MI in the past. Hypertension. Diabetes mellitus. Hyperlipidemia. Degenerative joint disease. Acute on chronic anemia, rule out GI loss. GERD. History of carcinoma of prostate. History of bronchial asthma. Plan DC heparin change IV nitroglycerin to Nitro-Dur patch as per orders Check Lexiscan Myoview result Avoid NSAIDs Consider GI evaluation  LOS: 0 days    Charolette Forward 06/25/2016, 11:34 AM

## 2016-06-25 NOTE — Discharge Instructions (Signed)
Food Choices for Gastroesophageal Reflux Disease, Adult When you have gastroesophageal reflux disease (GERD), the foods you eat and your eating habits are very important. Choosing the right foods can help ease the discomfort of GERD. Consider working with a diet and nutrition specialist (dietitian) to help you make healthy food choices. What general guidelines should I follow? Eating plan  Choose healthy foods low in fat, such as fruits, vegetables, whole grains, low-fat dairy products, and lean meat, fish, and poultry.  Eat frequent, small meals instead of three large meals each day. Eat your meals slowly, in a relaxed setting. Avoid bending over or lying down until 2-3 hours after eating.  Limit high-fat foods such as fatty meats or fried foods.  Limit your intake of oils, butter, and shortening to less than 8 teaspoons each day.  Avoid the following: ? Foods that cause symptoms. These may be different for different people. Keep a food diary to keep track of foods that cause symptoms. ? Alcohol. ? Drinking large amounts of liquid with meals. ? Eating meals during the 2-3 hours before bed.  Cook foods using methods other than frying. This may include baking, grilling, or broiling. Lifestyle   Maintain a healthy weight. Ask your health care provider what weight is healthy for you. If you need to lose weight, work with your health care provider to do so safely.  Exercise for at least 30 minutes on 5 or more days each week, or as told by your health care provider.  Avoid wearing clothes that fit tightly around your waist and chest.  Do not use any products that contain nicotine or tobacco, such as cigarettes and e-cigarettes. If you need help quitting, ask your health care provider.  Sleep with the head of your bed raised. Use a wedge under the mattress or blocks under the bed frame to raise the head of the bed. What foods are not recommended? The items listed may not be a complete  list. Talk with your dietitian about what dietary choices are best for you. Grains Pastries or quick breads with added fat. French toast. Vegetables Deep fried vegetables. French fries. Any vegetables prepared with added fat. Any vegetables that cause symptoms. For some people this may include tomatoes and tomato products, chili peppers, onions and garlic, and horseradish. Fruits Any fruits prepared with added fat. Any fruits that cause symptoms. For some people this may include citrus fruits, such as oranges, grapefruit, pineapple, and lemons. Meats and other protein foods High-fat meats, such as fatty beef or pork, hot dogs, ribs, ham, sausage, salami and bacon. Fried meat or protein, including fried fish and fried chicken. Nuts and nut butters. Dairy Whole milk and chocolate milk. Sour cream. Cream. Ice cream. Cream cheese. Milk shakes. Beverages Coffee and tea, with or without caffeine. Carbonated beverages. Sodas. Energy drinks. Fruit juice made with acidic fruits (such as orange or grapefruit). Tomato juice. Alcoholic drinks. Fats and oils Butter. Margarine. Shortening. Ghee. Sweets and desserts Chocolate and cocoa. Donuts. Seasoning and other foods Pepper. Peppermint and spearmint. Any condiments, herbs, or seasonings that cause symptoms. For some people, this may include curry, hot sauce, or vinegar-based salad dressings. Summary  When you have gastroesophageal reflux disease (GERD), food and lifestyle choices are very important to help ease the discomfort of GERD.  Eat frequent, small meals instead of three large meals each day. Eat your meals slowly, in a relaxed setting. Avoid bending over or lying down until 2-3 hours after eating.  Limit high-fat   foods such as fatty meat or fried foods. This information is not intended to replace advice given to you by your health care provider. Make sure you discuss any questions you have with your health care provider. Document Released:  03/28/2005 Document Revised: 03/29/2016 Document Reviewed: 03/29/2016 Elsevier Interactive Patient Education  2017 [employer]  

## 2016-06-25 NOTE — Progress Notes (Signed)
ANTICOAGULATION CONSULT NOTE - Follow Up Consult  Pharmacy Consult for Heparin Indication: ACS  Allergies  Allergen Reactions  . Shellfish Allergy Rash    Patient Measurements: Height: 6\' 2"  (188 cm) Weight: 251 lb 5.2 oz (114 kg) IBW/kg (Calculated) : 82.2 Heparin Dosing Weight:   Vital Signs: Temp: 98.2 F (36.8 C) (03/16 2000) Temp Source: Oral (03/16 2000) BP: 148/58 (03/17 0200) Pulse Rate: 65 (03/17 0200)  Labs:  Recent Labs  06/24/16 0810 06/24/16 1404 06/24/16 1632 06/24/16 2022 06/25/16 0201  HGB 8.9*  --   --   --  8.4*  HCT 25.6*  --   --   --  23.5*  PLT 283  --   --   --  262  HEPARINUNFRC  --   --  0.31  --  0.21*  CREATININE 1.08  --   --   --   --   TROPONINI 0.03* <0.03  --  <0.03 <0.03    Estimated Creatinine Clearance: 85.4 mL/min (by C-G formula based on SCr of 1.08 mg/dL).   Medications:  Infusions:  . sodium chloride 75 mL/hr at 06/25/16 0359  . heparin 1,350 Units/hr (06/25/16 0359)  . nitroGLYCERIN 10 mcg/min (06/25/16 0102)    Assessment: Patient with low heparin level.  No heparin issues per RN.  Goal of Therapy:  Heparin level 0.3-0.7 units/ml Monitor platelets by anticoagulation protocol: Yes   Plan:  Increase heparin to 1550 units/hr Recheck level at [address] [place] 06/25/2016,5:32 AM

## 2016-06-26 DIAGNOSIS — E785 Hyperlipidemia, unspecified: Secondary | ICD-10-CM | POA: Diagnosis not present

## 2016-06-26 DIAGNOSIS — I10 Essential (primary) hypertension: Secondary | ICD-10-CM

## 2016-06-26 DIAGNOSIS — D500 Iron deficiency anemia secondary to blood loss (chronic): Secondary | ICD-10-CM

## 2016-06-26 DIAGNOSIS — I209 Angina pectoris, unspecified: Secondary | ICD-10-CM | POA: Diagnosis not present

## 2016-06-26 DIAGNOSIS — E1165 Type 2 diabetes mellitus with hyperglycemia: Secondary | ICD-10-CM

## 2016-06-26 DIAGNOSIS — E1169 Type 2 diabetes mellitus with other specified complication: Secondary | ICD-10-CM

## 2016-06-26 LAB — CBC
HEMATOCRIT: 24.4 % — AB (ref 39.0–52.0)
Hemoglobin: 8.5 g/dL — ABNORMAL LOW (ref 13.0–17.0)
MCH: 32.2 pg (ref 26.0–34.0)
MCHC: 34.8 g/dL (ref 30.0–36.0)
MCV: 92.4 fL (ref 78.0–100.0)
PLATELETS: 285 10*3/uL (ref 150–400)
RBC: 2.64 MIL/uL — ABNORMAL LOW (ref 4.22–5.81)
RDW: 13.5 % (ref 11.5–15.5)
WBC: 4.7 10*3/uL (ref 4.0–10.5)

## 2016-06-26 LAB — GLUCOSE, CAPILLARY
GLUCOSE-CAPILLARY: 180 mg/dL — AB (ref 65–99)
Glucose-Capillary: 167 mg/dL — ABNORMAL HIGH (ref 65–99)

## 2016-06-26 MED ORDER — ATORVASTATIN CALCIUM 40 MG PO TABS: 40.0000 mg | ORAL_TABLET | Freq: Every day | ORAL | 0 refills | Status: DC

## 2016-06-26 MED ORDER — METFORMIN HCL 1000 MG PO TABS: 1000.0000 mg | ORAL_TABLET | Freq: Two times a day (BID) | ORAL | 0 refills | Status: DC

## 2016-06-26 MED ORDER — OMEPRAZOLE 40 MG PO CPDR: 40.0000 mg | DELAYED_RELEASE_CAPSULE | Freq: Two times a day (BID) | ORAL | 1 refills | Status: DC

## 2016-06-26 MED ORDER — SITAGLIPTIN PHOSPHATE 50 MG PO TABS: 50.0000 mg | ORAL_TABLET | Freq: Every day | ORAL | 0 refills | Status: DC

## 2016-06-26 NOTE — Discharge Summary (Signed)
Physician Discharge Summary  [name] OBS:[id number] DOB: Mar 15, 1946 DOA: 06/24/2016  PCP: Patricia Nettle, MD  Admit date: 06/24/2016 Discharge date: 06/26/2016  Admitted From: Home Disposition:  Home  Recommendations for Outpatient Follow-up:  1. Follow up with PCP in 1-2 weeks 2. Please obtain BMP/CBC in one week 3. Please follow up on the following pending results:  Home Health: No Equipment/Devices:N/A  Discharge Condition: Stable CODE STATUS: FULL Diet recommendation: Heart Healthy / Carb Modified   Brief/Interim Summary: 71 year old male with a history of hypertension, diabetes mellitus, GERD presented with 2 weeks of intermittent substernal chest burning. He states that he had worsening of his chest discomfort walking up steps and while carrying heavy loads. There was concern for unstable angina at the time of admission, and the patient was started on intravenous heparin and nitroglycerin. Cardiology saw the patient and ordered nuclear medicine stress test which was intermediate risk, but negative for reversible ischemia. EF was 51%. Echocardiogram showed EF 50-55%, grade 1 DD, inadequate motion assessment. Intravenous heparin was discontinued. IV nitroglycerin was transitioned to nitroglycerin patch. The patient was also noted to have had a drop of his hemoglobin from 12.9 to 8.9 from 1 year ago. He endorsed using Aleve and indomethacin extensively in the recent past. However he did not have any hematochezia or melena. His hemoglobin remained stable throughout the hospitalization. He remained hemodynamically stable. There were no overt signs of active bleeding. He stated that he has had a previous colonoscopy performed by Dr. Wilford Corner about  1 and 1/2 years ago which only showed polyps, but was lost to followup before having an EGD done.  The patient's chest discomfort resolved. He remained hemodynamically stable throughout the hospitalization. He will need to follow  up with GI, [name] for EGD in outpt setting.  He was started on PPI bid x 1 month, then once daily.  He was instructed to avoid NSAIDS  Discharge Diagnoses:  Angina -Patient cardiology evaluation -Nuclear medicine stress test--EF 51%, intermediate risk, but no reversible ischemia -Continue aspirin -Continue carvedilol 25 mg twice a day -LDL 82 -increase lipitor to 40 mg daily   hypertension  -Increase amlodipine to 10 mg daily  -Continue carvedilol 25 mg twice a day  -Restart valsartan/HCTZ    diabetes mellitus type 2, poorly controlled -The patient was poorly compliant with his medications -06/24/2016 hemoglobin A1c 8.7 -Continue Amaryl 4 mg daily, metformin -Increase metformin to 1000 mg twice a day -add sitagliptin  Chronic Blood loss anemia -Likely secondary to peptic ulcer disease and chronic NSAID use  -discussed discontinuation of all NSAIDs with patient -Follow up with GI, [name], outpt for EGD -PPI bid x 1 month, then q day   Hyperlipidemia  -LDL 82  -Increase Lipitor to 40 times daily    Discharge Instructions  Discharge Instructions    Diet - low sodium heart healthy    Complete by:  As directed    Increase activity slowly    Complete by:  As directed      Allergies as of 06/26/2016      Reactions   Shellfish Allergy Rash      Medication List    STOP taking these medications   indomethacin 25 MG capsule Commonly known as:  INDOCIN     TAKE these medications   amLODipine 10 MG tablet Commonly known as:  NORVASC Take 10 mg by mouth daily.   aspirin 325 MG tablet Take 325 mg by mouth daily.   atorvastatin 40  MG tablet Commonly known as:  LIPITOR Take 1 tablet (40 mg total) by mouth daily. What changed:  medication strength  how much to take   calcium-vitamin D 500-200 MG-UNIT tablet Commonly known as:  OSCAL WITH D Take 1 tablet by mouth daily with breakfast.   carvedilol 25 MG tablet Commonly known as:  COREG Take 25 mg  by mouth 2 (two) times daily with a meal.   co-enzyme Q-10 30 MG capsule Take 30 mg by mouth daily.   glimepiride 4 MG tablet Commonly known as:  AMARYL Take 4 mg by mouth daily before breakfast.   metFORMIN 1000 MG tablet Commonly known as:  GLUCOPHAGE Take 1 tablet (1,000 mg total) by mouth 2 (two) times daily with a meal. What changed:  medication strength  how much to take  when to take this  additional instructions   multivitamin with minerals Tabs tablet Take 1 tablet by mouth daily.   omeprazole 40 MG capsule Commonly known as:  PRILOSEC Take 1 capsule (40 mg total) by mouth 2 (two) times daily. X 1 month, then once daily   sitaGLIPtin 50 MG tablet Commonly known as:  JANUVIA Take 1 tablet (50 mg total) by mouth daily.   valsartan-hydrochlorothiazide 320-12.5 MG tablet Commonly known as:  DIOVAN-HCT Take 1 tablet by mouth every morning.       Allergies  Allergen Reactions  . Shellfish Allergy Rash    Consultations:  Cardiology--Harwani   Procedures/Studies: Dg Chest 2 View  Result Date: 06/24/2016 CLINICAL DATA:  Short of breath and cough for 2 weeks.  Indigestion. EXAM: CHEST  2 VIEW COMPARISON:  10/02/2012 FINDINGS: The heart is moderately enlarged. Normal vascularity. Clear lungs. No pneumothorax or pleural effusion. IMPRESSION: No active cardiopulmonary disease. Electronically Signed   By: Marybelle Killings M.D.   On: 06/24/2016 08:59   Nm Myocar Multi W/spect W/wall Motion / Ef  Result Date: 06/25/2016 CLINICAL DATA:  71 year old male with chest pain EXAM: MYOCARDIAL IMAGING WITH SPECT (REST AND PHARMACOLOGIC-STRESS) GATED LEFT VENTRICULAR WALL MOTION STUDY LEFT VENTRICULAR EJECTION FRACTION TECHNIQUE: Standard myocardial SPECT imaging was performed after resting intravenous injection of 10 mCi Tc-45m tetrofosmin. Subsequently, intravenous infusion of Lexiscan was performed under the supervision of the Cardiology staff. At peak effect of the drug, 30  mCi Tc-79m tetrofosmin was injected intravenously and standard myocardial SPECT imaging was performed. Quantitative gated imaging was also performed to evaluate left ventricular wall motion, and estimate left ventricular ejection fraction. COMPARISON:  Chest x-ray 06/24/2016 FINDINGS: Perfusion: Moderately large fixed defect in the inferior wall extending from the ventricular base to the apex with associated mild hypokinesis. Findings are most consistent with a region of prior infarct/ scarring. Wall Motion: Mild left ventricular dilatation and inferior wall hypokinesis. Left Ventricular Ejection Fraction: 51 % End diastolic volume 161 ml End systolic volume 77 ml IMPRESSION: 1. No evidence of reversible ischemia. There is a moderately large fixed defect along the inferior wall from the ventricular base to the apex with associated mild hypokinesis consistent with a region of prior infarct/ scarring. 2. Mild left ventricular dilatation and inferior wall hypokinesis. 3. Left ventricular ejection fraction 51% 4. Non invasive risk stratification*: Intermediate *2012 Appropriate Use Criteria for Coronary Revascularization Focused Update: J Am Coll Cardiol. 0960;45(4):098-119. [email or web address] Electronically Signed   By: Jacqulynn Cadet M.D.   On: 06/25/2016 14:13        Discharge Exam: Vitals:   06/26/16 0600 06/26/16 0800  BP: (!) 166/68 (!) 144/65  Pulse: 63  72  Resp: 14 19  Temp:     Vitals:   06/26/16 0400 06/26/16 0500 06/26/16 0600 06/26/16 0800  BP: (!) 153/73  (!) 166/68 (!) 144/65  Pulse: (!) 56 60 63 72  Resp: 12 13 14 19   Temp: 98.6 F (37 C)     TempSrc: Oral     SpO2: 95% 95% 98% 98%  Weight:      Height:        General: Pt is alert, awake, not in acute distress Cardiovascular: RRR, S1/S2 +, no rubs, no gallops Respiratory: CTA bilaterally, no wheezing, no rhonchi Abdominal: Soft, NT, ND, bowel sounds + Extremities: no edema, no  cyanosis   The results of significant diagnostics from this hospitalization (including imaging, microbiology, ancillary and laboratory) are listed below for reference.    Significant Diagnostic Studies: Dg Chest 2 View  Result Date: 06/24/2016 CLINICAL DATA:  Short of breath and cough for 2 weeks.  Indigestion. EXAM: CHEST  2 VIEW COMPARISON:  10/02/2012 FINDINGS: The heart is moderately enlarged. Normal vascularity. Clear lungs. No pneumothorax or pleural effusion. IMPRESSION: No active cardiopulmonary disease. Electronically Signed   By: Marybelle Killings M.D.   On: 06/24/2016 08:59   Nm Myocar Multi W/spect W/wall Motion / Ef  Result Date: 06/25/2016 CLINICAL DATA:  71 year old male with chest pain EXAM: MYOCARDIAL IMAGING WITH SPECT (REST AND PHARMACOLOGIC-STRESS) GATED LEFT VENTRICULAR WALL MOTION STUDY LEFT VENTRICULAR EJECTION FRACTION TECHNIQUE: Standard myocardial SPECT imaging was performed after resting intravenous injection of 10 mCi Tc-64m tetrofosmin. Subsequently, intravenous infusion of Lexiscan was performed under the supervision of the Cardiology staff. At peak effect of the drug, 30 mCi Tc-10m tetrofosmin was injected intravenously and standard myocardial SPECT imaging was performed. Quantitative gated imaging was also performed to evaluate left ventricular wall motion, and estimate left ventricular ejection fraction. COMPARISON:  Chest x-ray 06/24/2016 FINDINGS: Perfusion: Moderately large fixed defect in the inferior wall extending from the ventricular base to the apex with associated mild hypokinesis. Findings are most consistent with a region of prior infarct/ scarring. Wall Motion: Mild left ventricular dilatation and inferior wall hypokinesis. Left Ventricular Ejection Fraction: 51 % End diastolic volume 433 ml End systolic volume 77 ml IMPRESSION: 1. No evidence of reversible ischemia. There is a moderately large fixed defect along the inferior wall from the ventricular base to the  apex with associated mild hypokinesis consistent with a region of prior infarct/ scarring. 2. Mild left ventricular dilatation and inferior wall hypokinesis. 3. Left ventricular ejection fraction 51% 4. Non invasive risk stratification*: Intermediate *2012 Appropriate Use Criteria for Coronary Revascularization Focused Update: J Am Coll Cardiol. 2951;88(4):166-063. http://[email or web address] Electronically Signed   By: Jacqulynn Cadet M.D.   On: 06/25/2016 14:13     Microbiology: Recent Results (from the past 240 hour(s))  MRSA PCR Screening     Status: None   Collection Time: 06/24/16  7:14 AM  Result Value Ref Range Status   MRSA by PCR NEGATIVE NEGATIVE Final    Comment:        The GeneXpert MRSA Assay (FDA approved for NASAL specimens only), is one component of a comprehensive MRSA colonization surveillance program. It is not intended to diagnose MRSA infection nor to guide or monitor treatment for MRSA infections.      Labs: Basic Metabolic Panel:  Recent Labs Lab 06/24/16 0810  NA 138  K 4.6  CL 106  CO2 26  GLUCOSE 269*  BUN 23*  CREATININE 1.08  CALCIUM 9.3  Liver Function Tests: No results for input(s): AST, ALT, ALKPHOS, BILITOT, PROT, ALBUMIN in the last 168 hours. No results for input(s): LIPASE, AMYLASE in the last 168 hours. No results for input(s): AMMONIA in the last 168 hours. CBC:  Recent Labs Lab 06/24/16 0810 06/25/16 0201 06/26/16 0307  WBC 4.0 4.6 4.7  NEUTROABS 2.5  --   --   HGB 8.9* 8.4* 8.5*  HCT 25.6* 23.5* 24.4*  MCV 90.1 92.2 92.4  PLT 283 262 285   Cardiac Enzymes:  Recent Labs Lab 06/24/16 0810 06/24/16 1404 06/24/16 2022 06/25/16 0201 06/25/16 0745  TROPONINI 0.03* <0.03 <0.03 <0.03 <0.03   BNP: Invalid input(s): POCBNP CBG:  Recent Labs Lab 06/25/16 1240 06/25/16 1614 06/26/16 0856 06/26/16 1122  GLUCAP 171* 148* 167* 180*    Time coordinating discharge:  Greater than  30 minutes  Signed:  Iverna Hammac, DO [facility] Pager: [phone] 06/26/2016, 12:25 PM

## 2016-06-26 NOTE — Progress Notes (Signed)
Subjective:  Doing well denies any further chest pain or burning. Nuclear stress test showed no evidence of reversible ischemia with inferior wall scarring EF of 51 percent.  Objective:  Vital Signs in the last 24 hours: Temp:  [98 F (36.7 C)-98.7 F (37.1 C)] 98.6 F (37 C) (03/18 0400) Pulse Rate:  [56-73] 72 (03/18 0800) Resp:  [12-22] 19 (03/18 0800) BP: (135-178)/(54-84) 144/65 (03/18 0800) SpO2:  [90 %-99 %] 98 % (03/18 0800)  Intake/Output from previous day: 03/17 0701 - 03/18 0700 In: 1882.4 [I.V.:1882.4] Out: 2150 [Urine:2150] Intake/Output from this shift: Total I/O In: 375 [I.V.:375] Out: 350 [Urine:350]  Physical Exam: Neck: no adenopathy, no carotid bruit, no JVD and supple, symmetrical, trachea midline Lungs: clear to auscultation bilaterally Heart: regular rate and rhythm, S1, S2 normal and Soft systolic murmur noted Abdomen: soft, non-tender; bowel sounds normal; no masses,  no organomegaly Extremities: extremities normal, atraumatic, no cyanosis or edema  Lab Results:  Recent Labs  06/25/16 0201 06/26/16 0307  WBC 4.6 4.7  HGB 8.4* 8.5*  PLT 262 285    Recent Labs  06/24/16 0810  NA 138  K 4.6  CL 106  CO2 26  GLUCOSE 269*  BUN 23*  CREATININE 1.08    Recent Labs  06/25/16 0201 06/25/16 0745  TROPONINI <0.03 <0.03   Hepatic Function Panel No results for input(s): PROT, ALBUMIN, AST, ALT, ALKPHOS, BILITOT, BILIDIR, IBILI in the last 72 hours.  Recent Labs  06/25/16 0201  CHOL 186   No results for input(s): PROTIME in the last 72 hours.  Imaging: Imaging results have been reviewed and Nm Myocar Multi W/spect W/wall Motion / Ef  Result Date: 06/25/2016 CLINICAL DATA:  71 year old male with chest pain EXAM: MYOCARDIAL IMAGING WITH SPECT (REST AND PHARMACOLOGIC-STRESS) GATED LEFT VENTRICULAR WALL MOTION STUDY LEFT VENTRICULAR EJECTION FRACTION TECHNIQUE: Standard myocardial SPECT imaging was performed after resting intravenous  injection of 10 mCi Tc-76m tetrofosmin. Subsequently, intravenous infusion of Lexiscan was performed under the supervision of the Cardiology staff. At peak effect of the drug, 30 mCi Tc-47m tetrofosmin was injected intravenously and standard myocardial SPECT imaging was performed. Quantitative gated imaging was also performed to evaluate left ventricular wall motion, and estimate left ventricular ejection fraction. COMPARISON:  Chest x-ray 06/24/2016 FINDINGS: Perfusion: Moderately large fixed defect in the inferior wall extending from the ventricular base to the apex with associated mild hypokinesis. Findings are most consistent with a region of prior infarct/ scarring. Wall Motion: Mild left ventricular dilatation and inferior wall hypokinesis. Left Ventricular Ejection Fraction: 51 % End diastolic volume 147 ml End systolic volume 77 ml IMPRESSION: 1. No evidence of reversible ischemia. There is a moderately large fixed defect along the inferior wall from the ventricular base to the apex with associated mild hypokinesis consistent with a region of prior infarct/ scarring. 2. Mild left ventricular dilatation and inferior wall hypokinesis. 3. Left ventricular ejection fraction 51% 4. Non invasive risk stratification*: Intermediate *2012 Appropriate Use Criteria for Coronary Revascularization Focused Update: J Am Coll Cardiol. 8295;62(1):308-657. http://[email or web address] Electronically Signed   By: Jacqulynn Cadet M.D.   On: 06/25/2016 14:13    Cardiac Studies:  Assessment/Plan:  Stable angina MI ruled out negative nuclear stress test Coronary artery disease, history of MI in the past. Hypertension. Diabetes mellitus. Hyperlipidemia. Degenerative joint disease. Acute on chronic anemia, rule out GI loss. GERD. History of carcinoma of prostate. History of bronchial asthma. Plan Okay to discharge from cardiac point of view Follow-up with Dr. Montez Morita  in  2 weeks   LOS: 0 days    Charolette Forward 06/26/2016, 11:09 AM

## 2016-06-27 NOTE — ED Provider Notes (Signed)
[facility] DEPT Provider Note   CSN: [id number] Arrival date & time: 06/24/16  0705     History   Chief Complaint Chief Complaint  Patient presents with  . Dizziness  . Heartburn    HPI [name] is a 71 y.o. male.  HPI Patient presents to the emergency department with chest discomfort on exertion over the last few weeks.  Patient states today he got up and felt dizzy.  Patient states that he has had a cardiac history.  He does have diabetes.  The patient states that other than exertion.  Nothing seemed to make the condition worse. The patient denies shortness of breath, headache,blurred vision, neck pain, fever, cough, weakness, numbness, anorexia, edema, abdominal pain, nausea, vomiting, diarrhea, rash, back pain, dysuria, hematemesis, bloody stool, near syncope, or syncope. Past Medical History:  Diagnosis Date  . Arthritis   . Asthma    no inhaler  . Coronary artery disease    cardiologist-  dr spruill; last visit 3 mos ago per pt  . GERD (gastroesophageal reflux disease)   . History of MI (myocardial infarction)    1985  . Hydronephrosis, left   . Hypertension   . Myocardial infarction   . Nephrolithiasis    left  . 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 Presence of tooth-root and mandibular implants    lower dental implants  . Prostate cancer ([place])   . Shortness of breath    WITH EXERTION  . Type 2 diabetes mellitus [facility])     Patient Active Problem List   Diagnosis Date Noted  . Uncontrolled type 2 diabetes mellitus with hyperglycemia, without long-term current use of insulin ([place]) 06/26/2016  . Hyperlipidemia with target LDL less than 70 06/26/2016  . Anemia due to blood loss, chronic 06/26/2016  . Angina pectoris ([place]) 06/26/2016  . Chest pain 06/24/2016  . Type 2 diabetes mellitus without complication, with long-term current use of insulin ([place]) 06/24/2016  . Essential hypertension 06/24/2016  . CAD (coronary artery  disease) 06/24/2016  . Asthma 06/24/2016  . Malignant neoplasm of prostate ([place]) 12/28/2015  . Postop Hyponatremia 04/16/2012  . Postop Acute blood loss anemia 04/16/2012  . OA (osteoarthritis) of hip 04/09/2012    Past Surgical History:  Procedure Laterality Date  . BUNIONECTOMY    . CYSTOSCOPY W/ RETROGRADES Left 03/14/2014   Procedure: CYSTOSCOPY WITH LEFT RETROGRADE PYELOGRAM, Left Ureteroscopy, Lweft ureteral Stent No string;  Surgeon: [name], MD;  Location: [facility];  Service: Urology;  Laterality: Left;  . HERNIA REPAIR    . PROSTATE BIOPSY    . TOTAL HIP ARTHROPLASTY Left 03-20-2009  . TOTAL HIP ARTHROPLASTY  04/09/2012   Procedure: TOTAL HIP ARTHROPLASTY;  Surgeon: [name], MD;  Location: [facility];  Service: Orthopedics;  Laterality: Right;  . TOTAL KNEE ARTHROPLASTY Left 05-16-2008       Home Medications    Prior to Admission medications   Medication Sig Start Date End Date Taking? Authorizing Provider  amLODipine (NORVASC) 10 MG tablet Take 10 mg by mouth daily.   Yes Historical Provider, MD  aspirin 325 MG tablet Take 325 mg by mouth daily.   Yes Historical Provider, MD  calcium-vitamin D (OSCAL WITH D) 500-200 MG-UNIT tablet Take 1 tablet by mouth daily with breakfast.   Yes Historical Provider, MD  carvedilol (COREG) 25 MG tablet Take 25 mg by mouth 2 (two) times daily with a meal.   Yes  Historical Provider, MD  co-enzyme Q-10 30 MG capsule Take 30 mg by mouth daily.   Yes Historical Provider, MD  glimepiride (AMARYL) 4 MG tablet Take 4 mg by mouth daily before breakfast.   Yes Historical Provider, MD  Multiple Vitamin (MULTIVITAMIN WITH MINERALS) TABS tablet Take 1 tablet by mouth daily.   Yes Historical Provider, MD  valsartan-hydrochlorothiazide (DIOVAN-HCT) 320-12.5 MG per tablet Take 1 tablet by mouth every morning.   Yes Historical Provider, MD  atorvastatin (LIPITOR) 40 MG tablet Take 1 tablet (40 mg total) by mouth daily. 06/26/16    Orson Eva, MD  metFORMIN (GLUCOPHAGE) 1000 MG tablet Take 1 tablet (1,000 mg total) by mouth 2 (two) times daily with a meal. 06/26/16   Orson Eva, MD  omeprazole (PRILOSEC) 40 MG capsule Take 1 capsule (40 mg total) by mouth 2 (two) times daily. X 1 month, then once daily 06/26/16   Orson Eva, MD  sitaGLIPtin (JANUVIA) 50 MG tablet Take 1 tablet (50 mg total) by mouth daily. 06/26/16   Orson Eva, MD    Family History Family History  Problem Relation Age of Onset  . Cancer Mother     breast  . Multiple sclerosis Daughter   . Cancer Father     prostate  . Cancer Maternal Uncle     bone    Social History Social History  Substance Use Topics  . Smoking status: Former Smoker    Packs/day: 1.00    Years: 25.00    Types: Cigarettes    Quit date: 04/11/1984  . Smokeless tobacco: Former Systems developer    Quit date: 04/02/1985  . Alcohol use Yes     Comment: RARE     Allergies   Shellfish allergy   Review of Systems Review of Systems All other systems negative except as documented in the HPI. All pertinent positives and negatives as reviewed in the HPI.  Physical Exam Updated Vital Signs BP (!) 144/65 (BP Location: Right Arm)   Pulse 72   Temp 98.6 F (37 C) (Oral)   Resp 19   Ht 6\' 2"  (1.88 m)   Wt 114 kg   SpO2 98%   BMI 32.27 kg/m   Physical Exam  Constitutional: He is oriented to person, place, and time. He appears well-developed and well-nourished. No distress.  HENT:  Head: Normocephalic and atraumatic.  Mouth/Throat: Oropharynx is clear and moist.  Eyes: Pupils are equal, round, and reactive to light.  Neck: Normal range of motion. Neck supple.  Cardiovascular: Normal rate, regular rhythm and normal heart sounds.  Exam reveals no gallop and no friction rub.   No murmur heard. Pulmonary/Chest: Effort normal and breath sounds normal. No respiratory distress. He has no wheezes.  Abdominal: Soft. Bowel sounds are normal. He exhibits no distension. There is no tenderness.    Neurological: He is alert and oriented to person, place, and time. He exhibits normal muscle tone. Coordination normal.  Skin: Skin is warm and dry. Capillary refill takes less than 2 seconds. No rash noted. No erythema.  Psychiatric: He has a normal mood and affect. His behavior is normal.  Nursing note and vitals reviewed.    ED Treatments / Results  Labs (all labs ordered are listed, but only abnormal results are displayed) Labs Reviewed  BASIC METABOLIC PANEL - Abnormal; Notable for the following:       Result Value   Glucose, Bld 269 (*)    BUN 23 (*)    All other components within  normal limits  CBC WITH DIFFERENTIAL/PLATELET - Abnormal; Notable for the following:    RBC 2.84 (*)    Hemoglobin 8.9 (*)    HCT 25.6 (*)    All other components within normal limits  URINALYSIS, ROUTINE W REFLEX MICROSCOPIC - Abnormal; Notable for the following:    Color, Urine STRAW (*)    Glucose, UA >=500 (*)    All other components within normal limits  TROPONIN I - Abnormal; Notable for the following:    Troponin I 0.03 (*)    All other components within normal limits  RETICULOCYTES - Abnormal; Notable for the following:    RBC. 2.78 (*)    All other components within normal limits  HEMOGLOBIN A1C - Abnormal; Notable for the following:    Hgb A1c MFr Bld 8.7 (*)    All other components within normal limits  LIPID PANEL - Abnormal; Notable for the following:    Triglycerides 338 (*)    HDL 36 (*)    VLDL 68 (*)    All other components within normal limits  CBC - Abnormal; Notable for the following:    RBC 2.55 (*)    Hemoglobin 8.4 (*)    HCT 23.5 (*)    All other components within normal limits  HEPARIN LEVEL (UNFRACTIONATED) - Abnormal; Notable for the following:    Heparin Unfractionated 0.21 (*)    All other components within normal limits  GLUCOSE, CAPILLARY - Abnormal; Notable for the following:    Glucose-Capillary 171 (*)    All other components within normal limits   GLUCOSE, CAPILLARY - Abnormal; Notable for the following:    Glucose-Capillary 148 (*)    All other components within normal limits  CBC - Abnormal; Notable for the following:    RBC 2.64 (*)    Hemoglobin 8.5 (*)    HCT 24.4 (*)    All other components within normal limits  GLUCOSE, CAPILLARY - Abnormal; Notable for the following:    Glucose-Capillary 167 (*)    All other components within normal limits  GLUCOSE, CAPILLARY - Abnormal; Notable for the following:    Glucose-Capillary 180 (*)    All other components within normal limits  MRSA PCR SCREENING  TROPONIN I  VITAMIN B12  IRON AND TIBC  FERRITIN  FOLATE  HEPARIN LEVEL (UNFRACTIONATED)  TSH  TROPONIN I  TROPONIN I  TROPONIN I    EKG  EKG Interpretation  Date/Time:  Friday June 24 2016 08:18:50 EDT Ventricular Rate:  66 PR Interval:    QRS Duration: 98 QT Interval:  410 QTC Calculation: 430 R Axis:   32 Text Interpretation:  Sinus rhythm Prolonged PR interval Borderline T abnormalities, inferior leads NO STEMI Confirmed by LONG MD, JOSHUA ([id number]) on 06/26/2016 3:24:45 PM       Radiology No results found.  Procedures Procedures (including critical care time)  Medications Ordered in ED Medications  heparin bolus via infusion 4,000 Units (4,000 Units Intravenous Bolus from Bag 06/24/16 1210)     Initial Impression / Assessment and Plan / ED Course  I have reviewed the triage vital signs and the nursing notes.  Pertinent labs & imaging results that were available during my care of the patient were reviewed by me and considered in my medical decision making (see chart for details).     Patient has an elevated troponin with his exertional symptoms of feeling these be admitted to the hospital and evaluated further for this chest discomfort.  Patient is  advised plan and all questions were answered  Final Clinical Impressions(s) / ED Diagnoses   Final diagnoses:  Other chest pain    New  Prescriptions Discharge Medication List as of 06/26/2016  1:01 PM    START taking these medications   Details  omeprazole (PRILOSEC) 40 MG capsule Take 1 capsule (40 mg total) by mouth 2 (two) times daily. X 1 month, then once daily, Starting Sun 06/26/2016, Normal    sitaGLIPtin (JANUVIA) 50 MG tablet Take 1 tablet (50 mg total) by mouth daily., Starting Sun 06/26/2016, Normal         Dalia Heading, PA-C 06/27/16 [place], MD 06/28/16 [phone]

## 2016-08-11 DIAGNOSIS — Z961 Presence of intraocular lens: Secondary | ICD-10-CM | POA: Insufficient documentation

## 2016-11-15 DIAGNOSIS — D649 Anemia, unspecified: Secondary | ICD-10-CM | POA: Diagnosis not present

## 2016-11-15 DIAGNOSIS — I10 Essential (primary) hypertension: Secondary | ICD-10-CM | POA: Diagnosis not present

## 2016-11-15 DIAGNOSIS — J45909 Unspecified asthma, uncomplicated: Secondary | ICD-10-CM | POA: Diagnosis not present

## 2016-11-15 DIAGNOSIS — E119 Type 2 diabetes mellitus without complications: Secondary | ICD-10-CM | POA: Diagnosis not present

## 2016-11-15 DIAGNOSIS — E785 Hyperlipidemia, unspecified: Secondary | ICD-10-CM | POA: Diagnosis not present

## 2016-11-15 DIAGNOSIS — I208 Other forms of angina pectoris: Secondary | ICD-10-CM | POA: Diagnosis not present

## 2016-11-22 ENCOUNTER — Encounter (HOSPITAL_COMMUNITY): Payer: Self-pay | Admitting: Emergency Medicine

## 2016-11-22 ENCOUNTER — Emergency Department (HOSPITAL_COMMUNITY): Payer: Medicare HMO

## 2016-11-22 ENCOUNTER — Emergency Department (HOSPITAL_COMMUNITY)
Admission: EM | Admit: 2016-11-22 | Discharge: 2016-11-22 | Disposition: A | Discharge status: Home / Self Care | Payer: Medicare HMO | Attending: Emergency Medicine | Admitting: Emergency Medicine

## 2016-11-22 DIAGNOSIS — S40012A Contusion of left shoulder, initial encounter: Secondary | ICD-10-CM | POA: Diagnosis not present

## 2016-11-22 DIAGNOSIS — W109XXA Fall (on) (from) unspecified stairs and steps, initial encounter: Secondary | ICD-10-CM | POA: Insufficient documentation

## 2016-11-22 DIAGNOSIS — M25512 Pain in left shoulder: Secondary | ICD-10-CM | POA: Diagnosis not present

## 2016-11-22 DIAGNOSIS — M7502 Adhesive capsulitis of left shoulder: Secondary | ICD-10-CM | POA: Diagnosis not present

## 2016-11-22 DIAGNOSIS — Y929 Unspecified place or not applicable: Secondary | ICD-10-CM | POA: Diagnosis not present

## 2016-11-22 DIAGNOSIS — E119 Type 2 diabetes mellitus without complications: Secondary | ICD-10-CM | POA: Diagnosis not present

## 2016-11-22 DIAGNOSIS — I2510 Atherosclerotic heart disease of native coronary artery without angina pectoris: Secondary | ICD-10-CM | POA: Insufficient documentation

## 2016-11-22 DIAGNOSIS — J45909 Unspecified asthma, uncomplicated: Secondary | ICD-10-CM | POA: Diagnosis not present

## 2016-11-22 DIAGNOSIS — Z7982 Long term (current) use of aspirin: Secondary | ICD-10-CM | POA: Insufficient documentation

## 2016-11-22 DIAGNOSIS — Z87891 Personal history of nicotine dependence: Secondary | ICD-10-CM | POA: Insufficient documentation

## 2016-11-22 DIAGNOSIS — Y9301 Activity, walking, marching and hiking: Secondary | ICD-10-CM | POA: Diagnosis not present

## 2016-11-22 DIAGNOSIS — Y999 Unspecified external cause status: Secondary | ICD-10-CM | POA: Diagnosis not present

## 2016-11-22 DIAGNOSIS — I10 Essential (primary) hypertension: Secondary | ICD-10-CM | POA: Insufficient documentation

## 2016-11-22 DIAGNOSIS — Z96643 Presence of artificial hip joint, bilateral: Secondary | ICD-10-CM | POA: Diagnosis not present

## 2016-11-22 DIAGNOSIS — Z7984 Long term (current) use of oral hypoglycemic drugs: Secondary | ICD-10-CM | POA: Insufficient documentation

## 2016-11-22 DIAGNOSIS — S4992XA Unspecified injury of left shoulder and upper arm, initial encounter: Secondary | ICD-10-CM | POA: Diagnosis not present

## 2016-11-22 NOTE — ED Provider Notes (Signed)
[facility] DEPT Provider Note   CSN: [id number] Arrival date & time: 11/22/16  0413     History   Chief Complaint Chief Complaint  Patient presents with  . Shoulder Injury    HPI VANDER KUEKER is a 71 y.o. male.  The history is provided by the patient.  5 days ago, he tripped going up some stairs, landing on his left shoulder. He has had pain in the shoulder since then. Pain is worse when he abducts his arm, but is not present with forward flexion and backward flexion or internal and external rotation. He rates pain at 5/10. He has applied ice, but tried no other treatments. He denies other injury.  Past Medical History:  Diagnosis Date  . Arthritis   . Asthma    no inhaler  . Coronary artery disease    cardiologist-  dr spruill; last visit 3 mos ago per pt  . GERD (gastroesophageal reflux disease)   . History of MI (myocardial infarction)    1985  . Hydronephrosis, left   . Hypertension   . Myocardial infarction ([place])   . Nephrolithiasis    left  . 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 Presence of tooth-root and mandibular implants    lower dental implants  . Prostate cancer ([place])   . Shortness of breath    WITH EXERTION  . Type 2 diabetes mellitus [facility])     Patient Active Problem List   Diagnosis Date Noted  . Uncontrolled type 2 diabetes mellitus with hyperglycemia, without long-term current use of insulin ([place]) 06/26/2016  . Hyperlipidemia with target LDL less than 70 06/26/2016  . Anemia due to blood loss, chronic 06/26/2016  . Angina pectoris ([place]) 06/26/2016  . Chest pain 06/24/2016  . Type 2 diabetes mellitus without complication, with long-term current use of insulin ([place]) 06/24/2016  . Essential hypertension 06/24/2016  . CAD (coronary artery disease) 06/24/2016  . Asthma 06/24/2016  . Malignant neoplasm of prostate ([place]) 12/28/2015  . Postop Hyponatremia 04/16/2012  . Postop Acute blood loss anemia 04/16/2012  . OA  (osteoarthritis) of hip 04/09/2012    Past Surgical History:  Procedure Laterality Date  . BUNIONECTOMY    . CYSTOSCOPY W/ RETROGRADES Left 03/14/2014   Procedure: CYSTOSCOPY WITH LEFT RETROGRADE PYELOGRAM, Left Ureteroscopy, Lweft ureteral Stent No string;  Surgeon: [name], MD;  Location: [facility];  Service: Urology;  Laterality: Left;  . HERNIA REPAIR    . PROSTATE BIOPSY    . TOTAL HIP ARTHROPLASTY Left 03-20-2009  . TOTAL HIP ARTHROPLASTY  04/09/2012   Procedure: TOTAL HIP ARTHROPLASTY;  Surgeon: [name], MD;  Location: [facility];  Service: Orthopedics;  Laterality: Right;  . TOTAL KNEE ARTHROPLASTY Left 05-16-2008       Home Medications    Prior to Admission medications   Medication Sig Start Date End Date Taking? Authorizing Provider  amLODipine (NORVASC) 10 MG tablet Take 10 mg by mouth daily.    [provider]  aspirin 325 MG tablet Take 325 mg by mouth daily.    [provider]  atorvastatin (LIPITOR) 40 MG tablet Take 1 tablet (40 mg total) by mouth daily. 06/26/16   Orson Eva, MD  calcium-vitamin D (OSCAL WITH D) 500-200 MG-UNIT tablet Take 1 tablet by mouth daily with breakfast.    [provider]  carvedilol (COREG) 25 MG tablet Take 25 mg by mouth 2 (two) times daily with a meal.  [provider]  co-enzyme Q-10 30 MG capsule Take 30 mg by mouth daily.    [provider]  glimepiride (AMARYL) 4 MG tablet Take 4 mg by mouth daily before breakfast.    [provider]  metFORMIN (GLUCOPHAGE) 1000 MG tablet Take 1 tablet (1,000 mg total) by mouth 2 (two) times daily with a meal. 06/26/16   Tat, Shanon Brow, MD  Multiple Vitamin (MULTIVITAMIN WITH MINERALS) TABS tablet Take 1 tablet by mouth daily.    [provider]  omeprazole (PRILOSEC) 40 MG capsule Take 1 capsule (40 mg total) by mouth 2 (two) times daily. X 1 month, then once daily 06/26/16   Tat, Shanon Brow, MD  sitaGLIPtin (JANUVIA) 50 MG  tablet Take 1 tablet (50 mg total) by mouth daily. 06/26/16   Orson Eva, MD  valsartan-hydrochlorothiazide (DIOVAN-HCT) 320-12.5 MG per tablet Take 1 tablet by mouth every morning.    [provider]    Family History Family History  Problem Relation Age of Onset  . Cancer Mother        breast  . Multiple sclerosis Daughter   . Cancer Father        prostate  . Cancer Maternal Uncle        bone    Social History Social History  Substance Use Topics  . Smoking status: Former Smoker    Packs/day: 1.00    Years: 25.00    Types: Cigarettes    Quit date: 04/11/1984  . Smokeless tobacco: Former Systems developer    Quit date: 04/02/1985  . Alcohol use Yes     Comment: RARE     Allergies   Shellfish allergy   Review of Systems Review of Systems  All other systems reviewed and are negative.    Physical Exam Updated Vital Signs BP (!) 143/83 (BP Location: Right Arm)   Pulse 80   Temp 97.8 F (36.6 C) (Oral)   Resp 16   Ht 6' (1.829 m)   Wt 113.4 kg (250 lb)   SpO2 96%   BMI 33.91 kg/m   Physical Exam  Nursing note and vitals reviewed.  71 year old male, resting comfortably and in no acute distress. Vital signs are significant for borderline hypertension. Oxygen saturation is 96%, which is normal. Head is normocephalic and atraumatic. PERRLA, EOMI. Oropharynx is clear. Neck is nontender and supple without adenopathy or JVD. Back is nontender and there is no CVA tenderness. Lungs are clear without rales, wheezes, or rhonchi. Chest is nontender. Heart has regular rate and rhythm without murmur. Abdomen is soft, flat, nontender without masses or hepatosplenomegaly and peristalsis is normoactive. Extremities: There is no swelling or deformity. There is minimal tenderness to palpation over the superior aspect of the left shoulder. Range of motion of the left shoulder is restricted to about 45 of abduction. There is normal forward flexion, backward flexion, internal  rotation, external rotation.. Skin is warm and dry without rash. Neurologic: Mental status is normal, cranial nerves are intact, there are no motor or sensory deficits.  ED Treatments / Results   Radiology Dg Shoulder Left  Result Date: 11/22/2016 CLINICAL DATA:  Status post fall on stairs, with left shoulder pain. Initial encounter. EXAM: LEFT SHOULDER - 2+ VIEW COMPARISON:  None. FINDINGS: There is no evidence of fracture or dislocation. The left humeral head is seated within the glenoid fossa. Degenerative change is noted at the left acromioclavicular joint. No significant soft tissue abnormalities are seen. The visualized portions of the left  lung are clear. IMPRESSION: No evidence of fracture or dislocation. Electronically Signed   By: Garald Balding M.D.   On: 11/22/2016 04:46    Procedures Procedures (including critical care time)  Medications Ordered in ED Medications - No data to display   Initial Impression / Assessment and Plan / ED Course  I have reviewed the triage vital signs and the nursing notes.  Pertinent imaging results that were available during my care of the patient were reviewed by me and considered in my medical decision making (see chart for details).  Left shoulder pain following fall. Physical findings strongly suggestive of adhesive capsulitis. This is explained to the patient. He is discharged in by [facility] over-the-counter NSAIDs, follow up with orthopedics for further evaluation.  Final Clinical Impressions(s) / ED Diagnoses   Final diagnoses:  Fall on or from stairs or steps, initial encounter  Contusion of left shoulder, initial encounter  Adhesive capsulitis of left shoulder    New Prescriptions New Prescriptions   No medications on file     Delora Fuel, MD 35/82/51 [phone]

## 2016-11-22 NOTE — ED Triage Notes (Signed)
Pt reports falling on 11/17/16 and landed on left shoulder. Pt denies any LOC or head injury. Pt reports worsening pain over the last several days. Pt able to lift left arm up to chest level before experiencing pain.

## 2016-11-22 NOTE — Discharge Instructions (Signed)
Apply ice several times a day. Take ibuprofen or naproxen for pain. Please follow up with the orthopedic surgeon - you may benefit from a course of physical therapy.

## 2016-12-21 DIAGNOSIS — E785 Hyperlipidemia, unspecified: Secondary | ICD-10-CM | POA: Diagnosis not present

## 2016-12-21 DIAGNOSIS — E119 Type 2 diabetes mellitus without complications: Secondary | ICD-10-CM | POA: Diagnosis not present

## 2016-12-21 DIAGNOSIS — C61 Malignant neoplasm of prostate: Secondary | ICD-10-CM | POA: Diagnosis not present

## 2016-12-21 DIAGNOSIS — I10 Essential (primary) hypertension: Secondary | ICD-10-CM | POA: Diagnosis not present

## 2016-12-28 DIAGNOSIS — C61 Malignant neoplasm of prostate: Secondary | ICD-10-CM | POA: Diagnosis not present

## 2017-01-13 DIAGNOSIS — J45909 Unspecified asthma, uncomplicated: Secondary | ICD-10-CM | POA: Diagnosis not present

## 2017-01-13 DIAGNOSIS — E119 Type 2 diabetes mellitus without complications: Secondary | ICD-10-CM | POA: Diagnosis not present

## 2017-01-13 DIAGNOSIS — I252 Old myocardial infarction: Secondary | ICD-10-CM | POA: Diagnosis not present

## 2017-01-13 DIAGNOSIS — I25118 Atherosclerotic heart disease of native coronary artery with other forms of angina pectoris: Secondary | ICD-10-CM | POA: Diagnosis not present

## 2017-01-13 DIAGNOSIS — E785 Hyperlipidemia, unspecified: Secondary | ICD-10-CM | POA: Diagnosis not present

## 2017-01-13 DIAGNOSIS — D649 Anemia, unspecified: Secondary | ICD-10-CM | POA: Diagnosis not present

## 2017-01-13 DIAGNOSIS — I10 Essential (primary) hypertension: Secondary | ICD-10-CM | POA: Diagnosis not present

## 2017-01-18 DIAGNOSIS — I252 Old myocardial infarction: Secondary | ICD-10-CM | POA: Diagnosis not present

## 2017-01-18 DIAGNOSIS — J45909 Unspecified asthma, uncomplicated: Secondary | ICD-10-CM | POA: Diagnosis not present

## 2017-01-18 DIAGNOSIS — E119 Type 2 diabetes mellitus without complications: Secondary | ICD-10-CM | POA: Diagnosis not present

## 2017-01-18 DIAGNOSIS — I10 Essential (primary) hypertension: Secondary | ICD-10-CM | POA: Diagnosis not present

## 2017-01-18 DIAGNOSIS — I25118 Atherosclerotic heart disease of native coronary artery with other forms of angina pectoris: Secondary | ICD-10-CM | POA: Diagnosis not present

## 2017-01-18 DIAGNOSIS — E785 Hyperlipidemia, unspecified: Secondary | ICD-10-CM | POA: Diagnosis not present

## 2017-01-18 DIAGNOSIS — D649 Anemia, unspecified: Secondary | ICD-10-CM | POA: Diagnosis not present

## 2017-02-03 DIAGNOSIS — D649 Anemia, unspecified: Secondary | ICD-10-CM | POA: Diagnosis not present

## 2017-02-03 DIAGNOSIS — I10 Essential (primary) hypertension: Secondary | ICD-10-CM | POA: Diagnosis not present

## 2017-03-07 DIAGNOSIS — M25562 Pain in left knee: Secondary | ICD-10-CM | POA: Diagnosis not present

## 2017-03-07 DIAGNOSIS — M25552 Pain in left hip: Secondary | ICD-10-CM | POA: Diagnosis not present

## 2017-04-17 DIAGNOSIS — E785 Hyperlipidemia, unspecified: Secondary | ICD-10-CM | POA: Diagnosis not present

## 2017-04-17 DIAGNOSIS — D649 Anemia, unspecified: Secondary | ICD-10-CM | POA: Diagnosis not present

## 2017-04-17 DIAGNOSIS — E119 Type 2 diabetes mellitus without complications: Secondary | ICD-10-CM | POA: Diagnosis not present

## 2017-04-17 DIAGNOSIS — I10 Essential (primary) hypertension: Secondary | ICD-10-CM | POA: Diagnosis not present

## 2017-04-17 DIAGNOSIS — I25118 Atherosclerotic heart disease of native coronary artery with other forms of angina pectoris: Secondary | ICD-10-CM | POA: Diagnosis not present

## 2017-04-17 DIAGNOSIS — I252 Old myocardial infarction: Secondary | ICD-10-CM | POA: Diagnosis not present

## 2017-04-17 DIAGNOSIS — J45909 Unspecified asthma, uncomplicated: Secondary | ICD-10-CM | POA: Diagnosis not present

## 2017-05-31 ENCOUNTER — Ambulatory Visit: Payer: Medicare HMO | Admitting: Podiatry

## 2017-05-31 ENCOUNTER — Encounter: Payer: Self-pay | Admitting: Podiatry

## 2017-05-31 DIAGNOSIS — M205X1 Other deformities of toe(s) (acquired), right foot: Secondary | ICD-10-CM

## 2017-05-31 DIAGNOSIS — L600 Ingrowing nail: Secondary | ICD-10-CM

## 2017-05-31 NOTE — Patient Instructions (Addendum)

## 2017-06-02 NOTE — Progress Notes (Signed)
Subjective:   Patient ID: [name], male   DOB: 72 y.o.   MRN: [id number]   HPI Patient presents stating he has had a very painful ingrown toenail of his left big toe and is not noted drainage or redness but it sore and he tries to cut on himself and is worried the skin to cause a problem with his diabetes.  Patient states that it makes it hard to sleep at night and she is touching it or irritated and also has problems with his big toe joint right even though that is been under relative control.   ROS      Objective:  Physical Exam  Neurovascular status intact was noted with patient found to have good digital perfusion well perfused feet.  His sugar has been reasonably stable even though his last A1c was 9 but he does state he has been doing a better job of controlling it over the last few months and he presents with an incurvated left hallux medial border that is painful when pressed and diminished motion of the first MPJ right with inflammation around the joint surface     Assessment:  Ingrown toenail deformity left hallux medial border with pain along with hallux limitus deformity right     Plan:  H&P condition reviewed and discussed.  At this point I do think the nail needs to be corrected due to the pain he is in and I explained the risk of procedure and the risk of healing with his diabetes.  Patient wants procedure is willing to accept risk and signed consent form and today I infiltrated the left hallux 60 mg Xylocaine Marcaine mixture and after sterile prep of the toe using sterile instrumentation I removed the medial border.  I exposed matrix and applied phenol 3 applications 30 seconds followed by alcohol lavage and gave instructions on soaks.  I discussed that if it were to start throbbing to take the dressing off later today but if it remains pain-free to leave the dressing on for 24 hours.  Patient was instructed on soaks and will be seen back to recheck and I also discussed  the right foot and do not recommend injection given that we may need to do this in the near future

## 2017-06-12 DIAGNOSIS — C61 Malignant neoplasm of prostate: Secondary | ICD-10-CM | POA: Diagnosis not present

## 2017-06-20 DIAGNOSIS — N5201 Erectile dysfunction due to arterial insufficiency: Secondary | ICD-10-CM | POA: Diagnosis not present

## 2017-06-20 DIAGNOSIS — C61 Malignant neoplasm of prostate: Secondary | ICD-10-CM | POA: Diagnosis not present

## 2017-07-17 DIAGNOSIS — E119 Type 2 diabetes mellitus without complications: Secondary | ICD-10-CM | POA: Diagnosis not present

## 2017-07-17 DIAGNOSIS — E785 Hyperlipidemia, unspecified: Secondary | ICD-10-CM | POA: Diagnosis not present

## 2017-07-17 DIAGNOSIS — I10 Essential (primary) hypertension: Secondary | ICD-10-CM | POA: Diagnosis not present

## 2017-07-17 DIAGNOSIS — I252 Old myocardial infarction: Secondary | ICD-10-CM | POA: Diagnosis not present

## 2017-07-17 DIAGNOSIS — D649 Anemia, unspecified: Secondary | ICD-10-CM | POA: Diagnosis not present

## 2017-07-17 DIAGNOSIS — J45909 Unspecified asthma, uncomplicated: Secondary | ICD-10-CM | POA: Diagnosis not present

## 2017-07-17 DIAGNOSIS — I25118 Atherosclerotic heart disease of native coronary artery with other forms of angina pectoris: Secondary | ICD-10-CM | POA: Diagnosis not present

## 2017-07-18 DIAGNOSIS — I10 Essential (primary) hypertension: Secondary | ICD-10-CM | POA: Diagnosis not present

## 2017-07-18 DIAGNOSIS — E119 Type 2 diabetes mellitus without complications: Secondary | ICD-10-CM | POA: Diagnosis not present

## 2017-07-18 DIAGNOSIS — I2510 Atherosclerotic heart disease of native coronary artery without angina pectoris: Secondary | ICD-10-CM | POA: Diagnosis not present

## 2017-07-18 DIAGNOSIS — E785 Hyperlipidemia, unspecified: Secondary | ICD-10-CM | POA: Diagnosis not present

## 2017-08-17 DIAGNOSIS — H524 Presbyopia: Secondary | ICD-10-CM | POA: Diagnosis not present

## 2017-08-17 DIAGNOSIS — Z961 Presence of intraocular lens: Secondary | ICD-10-CM | POA: Diagnosis not present

## 2017-08-17 DIAGNOSIS — E119 Type 2 diabetes mellitus without complications: Secondary | ICD-10-CM | POA: Diagnosis not present

## 2017-10-16 DIAGNOSIS — I252 Old myocardial infarction: Secondary | ICD-10-CM | POA: Diagnosis not present

## 2017-10-16 DIAGNOSIS — J45909 Unspecified asthma, uncomplicated: Secondary | ICD-10-CM | POA: Diagnosis not present

## 2017-10-16 DIAGNOSIS — E119 Type 2 diabetes mellitus without complications: Secondary | ICD-10-CM | POA: Diagnosis not present

## 2017-10-16 DIAGNOSIS — D649 Anemia, unspecified: Secondary | ICD-10-CM | POA: Diagnosis not present

## 2017-10-16 DIAGNOSIS — I25118 Atherosclerotic heart disease of native coronary artery with other forms of angina pectoris: Secondary | ICD-10-CM | POA: Diagnosis not present

## 2017-10-16 DIAGNOSIS — E785 Hyperlipidemia, unspecified: Secondary | ICD-10-CM | POA: Diagnosis not present

## 2017-10-16 DIAGNOSIS — I10 Essential (primary) hypertension: Secondary | ICD-10-CM | POA: Diagnosis not present

## 2017-10-26 ENCOUNTER — Encounter: Payer: Self-pay | Admitting: Podiatry

## 2017-10-26 ENCOUNTER — Ambulatory Visit: Payer: Medicare HMO | Admitting: Podiatry

## 2017-10-26 DIAGNOSIS — L03032 Cellulitis of left toe: Secondary | ICD-10-CM | POA: Diagnosis not present

## 2017-10-27 NOTE — Progress Notes (Signed)
Subjective:   Patient ID: [name], male   DOB: 72 y.o.   MRN: [id number]   HPI Patient presents stating that he feels like his toenail may have gotten infected and he just wants to get it checked   ROS      Objective:  Physical Exam  Neurovascular status unchanged with patient found to have slight changes in the left hallux nail with crusted tissue slight amount of redness but localized with no proximal edema erythema or drainage noted     Assessment:  Possibility for a very mild paronychia infection but it appears to be localized to the area     Plan:  Cleaned out crusted tissue with sterile limitations instructed on continued soaks bandage usage and it should heal uneventfully and let us know if any redness were to occur drainage or increase in swelling

## 2017-12-19 DIAGNOSIS — I252 Old myocardial infarction: Secondary | ICD-10-CM | POA: Diagnosis not present

## 2017-12-19 DIAGNOSIS — E119 Type 2 diabetes mellitus without complications: Secondary | ICD-10-CM | POA: Diagnosis not present

## 2017-12-19 DIAGNOSIS — E785 Hyperlipidemia, unspecified: Secondary | ICD-10-CM | POA: Diagnosis not present

## 2017-12-19 DIAGNOSIS — I25118 Atherosclerotic heart disease of native coronary artery with other forms of angina pectoris: Secondary | ICD-10-CM | POA: Diagnosis not present

## 2017-12-19 DIAGNOSIS — I10 Essential (primary) hypertension: Secondary | ICD-10-CM | POA: Diagnosis not present

## 2017-12-19 DIAGNOSIS — J45909 Unspecified asthma, uncomplicated: Secondary | ICD-10-CM | POA: Diagnosis not present

## 2017-12-19 DIAGNOSIS — D649 Anemia, unspecified: Secondary | ICD-10-CM | POA: Diagnosis not present

## 2018-01-26 ENCOUNTER — Emergency Department (HOSPITAL_COMMUNITY)
Admission: EM | Admit: 2018-01-26 | Discharge: 2018-01-26 | Disposition: A | Discharge status: Home / Self Care | Payer: Medicare HMO | Attending: Emergency Medicine | Admitting: Emergency Medicine

## 2018-01-26 ENCOUNTER — Emergency Department (HOSPITAL_COMMUNITY): Payer: Medicare HMO

## 2018-01-26 DIAGNOSIS — E119 Type 2 diabetes mellitus without complications: Secondary | ICD-10-CM | POA: Diagnosis not present

## 2018-01-26 DIAGNOSIS — R059 Cough, unspecified: Secondary | ICD-10-CM

## 2018-01-26 DIAGNOSIS — Z79899 Other long term (current) drug therapy: Secondary | ICD-10-CM | POA: Insufficient documentation

## 2018-01-26 DIAGNOSIS — I10 Essential (primary) hypertension: Secondary | ICD-10-CM | POA: Diagnosis not present

## 2018-01-26 DIAGNOSIS — R05 Cough: Secondary | ICD-10-CM

## 2018-01-26 DIAGNOSIS — I2510 Atherosclerotic heart disease of native coronary artery without angina pectoris: Secondary | ICD-10-CM | POA: Insufficient documentation

## 2018-01-26 DIAGNOSIS — Z87891 Personal history of nicotine dependence: Secondary | ICD-10-CM | POA: Diagnosis not present

## 2018-01-26 DIAGNOSIS — Z7984 Long term (current) use of oral hypoglycemic drugs: Secondary | ICD-10-CM | POA: Insufficient documentation

## 2018-01-26 DIAGNOSIS — I252 Old myocardial infarction: Secondary | ICD-10-CM | POA: Diagnosis not present

## 2018-01-26 DIAGNOSIS — J40 Bronchitis, not specified as acute or chronic: Secondary | ICD-10-CM | POA: Insufficient documentation

## 2018-01-26 MED ORDER — AEROCHAMBER PLUS FLO-VU MEDIUM MISC
1.0000 | Freq: Once | Status: AC
  Administered 2018-01-26: 1
  Filled 2018-01-26: qty 1

## 2018-01-26 MED ORDER — PREDNISONE 20 MG PO TABS: 20.0000 mg | ORAL_TABLET | Freq: Every day | ORAL | 0 refills | Status: DC

## 2018-01-26 MED ORDER — GUAIFENESIN ER 1200 MG PO TB12: 1.0000 | ORAL_TABLET | Freq: Two times a day (BID) | ORAL | 0 refills | Status: DC

## 2018-01-26 MED ORDER — IPRATROPIUM-ALBUTEROL 0.5-2.5 (3) MG/3ML IN SOLN
3.0000 mL | Freq: Once | RESPIRATORY_TRACT | Status: AC
  Administered 2018-01-26: 3 mL via RESPIRATORY_TRACT
  Filled 2018-01-26: qty 3

## 2018-01-26 MED ORDER — ALBUTEROL SULFATE HFA 108 (90 BASE) MCG/ACT IN AERS
2.0000 | INHALATION_SPRAY | RESPIRATORY_TRACT | Status: DC | PRN
  Administered 2018-01-26: 2 via RESPIRATORY_TRACT
  Filled 2018-01-26: qty 6.7

## 2018-01-26 MED ORDER — BENZONATATE 100 MG PO CAPS: 100.0000 mg | ORAL_CAPSULE | Freq: Three times a day (TID) | ORAL | 0 refills | Status: DC

## 2018-01-26 NOTE — ED Triage Notes (Signed)
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 Pt took antibiotics about a week ago for this. Pt states symptoms are worse in the morning and cough is productive with yellow/clear sputum. Pt is afebrile and is not tachycardic. pt has clear lungs

## 2018-01-26 NOTE — Discharge Instructions (Signed)
Return here as needed. Follow up with your doctor. Your x-ray was normal no signs of pneumonia. Increase your water intake.

## 2018-01-26 NOTE — ED Notes (Signed)
Respiratory at bedside to do duoneb

## 2018-01-26 NOTE — ED Provider Notes (Signed)
[facility] DEPT Provider Note   CSN: [id number] Arrival date & time: 01/26/18  5284     History   Chief Complaint Chief Complaint  Patient presents with  . Cough    HPI [name] is a 72 y.o. male.  HPI Patient presents to the emergency department with cough that is been ongoing since then of August.  The patient states that he does landscaping work and he was out cutting a bunch of brushing debris and since it is been so dry out the material was very dusty.  Patient states that later that evening in the next day started having more cough.  He states cough is been persistent.  He states he was placed on a 7-day course of antibiotics last week per his doctor.  Patient states he did not take any other medications other than cough drops for his symptoms.  The patient denies chest pain,  headache,blurred vision, neck pain, fever, cough, weakness, numbness, dizziness, anorexia, edema, abdominal pain, nausea, vomiting, diarrhea, rash, back pain, dysuria, hematemesis, bloody stool, near syncope, or syncope. Past Medical History:  Diagnosis Date  . Arthritis   . Asthma    no inhaler  . Coronary artery disease    cardiologist-  dr spruill; last visit 3 mos ago per pt  . GERD (gastroesophageal reflux disease)   . History of MI (myocardial infarction)    1985  . Hydronephrosis, left   . Hypertension   . Myocardial infarction ([place])   . Nephrolithiasis    left  . 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 Presence of tooth-root and mandibular implants    lower dental implants  . Prostate cancer ([place])   . Shortness of breath    WITH EXERTION  . Type 2 diabetes mellitus [facility])     Patient Active Problem List   Diagnosis Date Noted  . Pseudophakia 08/11/2016  . Uncontrolled type 2 diabetes mellitus with hyperglycemia, without long-term current use of insulin ([place]) 06/26/2016  . Hyperlipidemia with target LDL less than 70  06/26/2016  . Anemia due to blood loss, chronic 06/26/2016  . Angina pectoris ([place]) 06/26/2016  . Chest pain 06/24/2016  . Type 2 diabetes mellitus without complication, with long-term current use of insulin ([place]) 06/24/2016  . Essential hypertension 06/24/2016  . CAD (coronary artery disease) 06/24/2016  . Asthma 06/24/2016  . Malignant neoplasm of prostate ([place]) 12/28/2015  . Postop Hyponatremia 04/16/2012  . Postop Acute blood loss anemia 04/16/2012  . OA (osteoarthritis) of hip 04/09/2012    Past Surgical History:  Procedure Laterality Date  . BUNIONECTOMY    . CYSTOSCOPY W/ RETROGRADES Left 03/14/2014   Procedure: CYSTOSCOPY WITH LEFT RETROGRADE PYELOGRAM, Left Ureteroscopy, Lweft ureteral Stent No string;  Surgeon: [name], MD;  Location: [facility];  Service: Urology;  Laterality: Left;  . HERNIA REPAIR    . PROSTATE BIOPSY    . TOTAL HIP ARTHROPLASTY Left 03-20-2009  . TOTAL HIP ARTHROPLASTY  04/09/2012   Procedure: TOTAL HIP ARTHROPLASTY;  Surgeon: [name], MD;  Location: [facility];  Service: Orthopedics;  Laterality: Right;  . TOTAL KNEE ARTHROPLASTY Left 05-16-2008        Home Medications    Prior to Admission medications   Medication Sig Start Date End Date Taking? Authorizing Provider  amLODipine (NORVASC) 10 MG tablet Take 10 mg by mouth daily.   Yes [provider]  aspirin 81 MG chewable tablet  Chew 81 mg by mouth daily.   Yes [provider]  atorvastatin (LIPITOR) 40 MG tablet Take 1 tablet (40 mg total) by mouth daily. 06/26/16  Yes Tat, Shanon Brow, MD  carvedilol (COREG) 25 MG tablet Take 25 mg by mouth 2 (two) times daily with a meal.   Yes [provider]  co-enzyme Q-10 30 MG capsule Take 30 mg by mouth daily.   Yes [provider]  glimepiride (AMARYL) 4 MG tablet Take 4 mg by mouth daily before breakfast.   Yes [provider]  IRON PO Take 65 mg by mouth 2 (two) times daily.    Yes  [provider]  losartan (COZAAR) 100 MG tablet Take 100 mg by mouth daily.  05/13/17  Yes [provider]  omeprazole (PRILOSEC) 40 MG capsule Take 1 capsule (40 mg total) by mouth 2 (two) times daily. X 1 month, then once daily 06/26/16  Yes Tat, David, MD  sitaGLIPtin (JANUVIA) 50 MG tablet Take 1 tablet (50 mg total) by mouth daily. 06/26/16  Yes Tat, Shanon Brow, MD  spironolactone (ALDACTONE) 25 MG tablet Take 25 mg by mouth daily.   Yes [provider]  metFORMIN (GLUCOPHAGE) 1000 MG tablet Take 1 tablet (1,000 mg total) by mouth 2 (two) times daily with a meal. Patient not taking: Reported on 01/26/2018 06/26/16   Orson Eva, MD    Family History Family History  Problem Relation Age of Onset  . Cancer Mother        breast  . Multiple sclerosis Daughter   . Cancer Father        prostate  . Cancer Maternal Uncle        bone    Social History Social History   Tobacco Use  . Smoking status: Former Smoker    Packs/day: 1.00    Years: 25.00    Pack years: 25.00    Types: Cigarettes    Last attempt to quit: 04/11/1984    Years since quitting: 33.8  . Smokeless tobacco: Former Systems developer    Quit date: 04/02/1985  Substance Use Topics  . Alcohol use: Yes    Comment: RARE  . Drug use: No     Allergies   Shellfish allergy   Review of Systems Review of Systems All other systems negative except as documented in the HPI. All pertinent positives and negatives as reviewed in the HPI.  Physical Exam Updated Vital Signs BP (!) 181/95 (BP Location: Left Arm)   Pulse 75   Temp 98 F (36.7 C) (Oral)   Resp 16   SpO2 97%   Physical Exam  Constitutional: He is oriented to person, place, and time. He appears well-developed and well-nourished. No distress.  HENT:  Head: Normocephalic and atraumatic.  Mouth/Throat: Oropharynx is clear and moist.  Eyes: Pupils are equal, round, and reactive to light.  Neck: Normal range of motion. Neck supple.  Cardiovascular:  Normal rate, regular rhythm and normal heart sounds. Exam reveals no gallop and no friction rub.  No murmur heard. Pulmonary/Chest: Effort normal. No stridor. No respiratory distress. He has wheezes in the right upper field, the right middle field, the left upper field and the left middle field. He has no rhonchi.  Abdominal: Soft. Bowel sounds are normal. He exhibits no distension. There is no tenderness.  Neurological: He is alert and oriented to person, place, and time. He exhibits normal muscle tone. Coordination normal.  Skin: Skin is warm and dry. Capillary refill takes less  than 2 seconds. No rash noted. No erythema.  Psychiatric: He has a normal mood and affect. His behavior is normal.  Nursing note and vitals reviewed.    ED Treatments / Results  Labs (all labs ordered are listed, but only abnormal results are displayed) Labs Reviewed - No data to display  EKG None  Radiology Dg Chest 2 View  Result Date: 01/26/2018 CLINICAL DATA:  Cough for 2 months. EXAM: CHEST - 2 VIEW COMPARISON:  Radiograph 06/24/2016 FINDINGS: The cardiomediastinal contours are unchanged with stable cardiomegaly. The lungs are clear. Pulmonary vasculature is normal. No consolidation, pleural effusion, or pneumothorax. No acute osseous abnormalities are seen. Degenerative change in the spine. IMPRESSION: Stable cardiomegaly without acute abnormality. Electronically Signed   By: Keith Rake M.D.   On: 01/26/2018 06:38    Procedures Procedures (including critical care time)  Medications Ordered in ED Medications  ipratropium-albuterol (DUONEB) 0.5-2.5 (3) MG/3ML nebulizer solution 3 mL (3 mLs Nebulization Given 01/26/18 0657)     Initial Impression / Assessment and Plan / ED Course  I have reviewed the triage vital signs and the nursing notes.  Pertinent labs & imaging results that were available during my care of the patient were reviewed by me and considered in my medical decision making (see  chart for details).     Patient does have a history of some asthma in the past and I feel like this is been exacerbated by the irritant cutting the debris.  I feel that this is a bronchitis/bronchial irritation.  Have advised the patient of the plan and all questions were answered.  The patient is maintaining good oxygenation here in the emergency room.  Final Clinical Impressions(s) / ED Diagnoses   Final diagnoses:  None    ED Discharge Orders    None       Dalia Heading, PA-C 01/26/18 [place]    Jola Schmidt, MD 01/26/18 [phone]

## 2018-02-07 DIAGNOSIS — J45909 Unspecified asthma, uncomplicated: Secondary | ICD-10-CM | POA: Diagnosis not present

## 2018-02-07 DIAGNOSIS — I25118 Atherosclerotic heart disease of native coronary artery with other forms of angina pectoris: Secondary | ICD-10-CM | POA: Diagnosis not present

## 2018-02-07 DIAGNOSIS — I10 Essential (primary) hypertension: Secondary | ICD-10-CM | POA: Diagnosis not present

## 2018-02-07 DIAGNOSIS — E119 Type 2 diabetes mellitus without complications: Secondary | ICD-10-CM | POA: Diagnosis not present

## 2018-02-20 DIAGNOSIS — F17210 Nicotine dependence, cigarettes, uncomplicated: Secondary | ICD-10-CM | POA: Diagnosis not present

## 2018-02-20 DIAGNOSIS — J410 Simple chronic bronchitis: Secondary | ICD-10-CM | POA: Diagnosis not present

## 2018-02-20 DIAGNOSIS — E119 Type 2 diabetes mellitus without complications: Secondary | ICD-10-CM | POA: Diagnosis not present

## 2018-02-20 DIAGNOSIS — J4520 Mild intermittent asthma, uncomplicated: Secondary | ICD-10-CM | POA: Diagnosis not present

## 2018-05-07 DIAGNOSIS — I10 Essential (primary) hypertension: Secondary | ICD-10-CM | POA: Diagnosis not present

## 2018-05-07 DIAGNOSIS — E785 Hyperlipidemia, unspecified: Secondary | ICD-10-CM | POA: Diagnosis not present

## 2018-05-07 DIAGNOSIS — J45909 Unspecified asthma, uncomplicated: Secondary | ICD-10-CM | POA: Diagnosis not present

## 2018-05-07 DIAGNOSIS — I25118 Atherosclerotic heart disease of native coronary artery with other forms of angina pectoris: Secondary | ICD-10-CM | POA: Diagnosis not present

## 2018-05-07 DIAGNOSIS — I252 Old myocardial infarction: Secondary | ICD-10-CM | POA: Diagnosis not present

## 2018-05-07 DIAGNOSIS — E119 Type 2 diabetes mellitus without complications: Secondary | ICD-10-CM | POA: Diagnosis not present

## 2018-05-11 DIAGNOSIS — I10 Essential (primary) hypertension: Secondary | ICD-10-CM | POA: Diagnosis not present

## 2018-05-11 DIAGNOSIS — E785 Hyperlipidemia, unspecified: Secondary | ICD-10-CM | POA: Diagnosis not present

## 2018-05-11 DIAGNOSIS — E119 Type 2 diabetes mellitus without complications: Secondary | ICD-10-CM | POA: Diagnosis not present

## 2018-08-06 DIAGNOSIS — I10 Essential (primary) hypertension: Secondary | ICD-10-CM | POA: Diagnosis not present

## 2018-08-06 DIAGNOSIS — J45909 Unspecified asthma, uncomplicated: Secondary | ICD-10-CM | POA: Diagnosis not present

## 2018-08-06 DIAGNOSIS — E119 Type 2 diabetes mellitus without complications: Secondary | ICD-10-CM | POA: Diagnosis not present

## 2018-08-06 DIAGNOSIS — I25118 Atherosclerotic heart disease of native coronary artery with other forms of angina pectoris: Secondary | ICD-10-CM | POA: Diagnosis not present

## 2018-08-20 DIAGNOSIS — E119 Type 2 diabetes mellitus without complications: Secondary | ICD-10-CM | POA: Diagnosis not present

## 2018-08-20 DIAGNOSIS — Z961 Presence of intraocular lens: Secondary | ICD-10-CM | POA: Diagnosis not present

## 2018-08-20 DIAGNOSIS — Z7984 Long term (current) use of oral hypoglycemic drugs: Secondary | ICD-10-CM | POA: Diagnosis not present

## 2018-08-20 DIAGNOSIS — H52203 Unspecified astigmatism, bilateral: Secondary | ICD-10-CM | POA: Diagnosis not present

## 2018-08-20 DIAGNOSIS — H5212 Myopia, left eye: Secondary | ICD-10-CM | POA: Diagnosis not present

## 2018-08-20 DIAGNOSIS — H524 Presbyopia: Secondary | ICD-10-CM | POA: Diagnosis not present

## 2018-08-27 ENCOUNTER — Other Ambulatory Visit: Payer: Self-pay

## 2018-08-27 NOTE — Patient Outreach (Signed)
  [place] [facility]) Care Management Chronic Special Needs Program  08/27/2018  Name: [name] DOB: 1946/03/24  MRN: [id number]  [name] is enrolled in a chronic special needs plan for Diabetes. Chronic Care Management Coordinator telephoned client to review health risk assessment and to develop individualized care plan.  Introduced the chronic care management program, importance of client participation, and taking their care plan to all provider appointments and inpatient facilities.  Reviewed the transition of care process and possible referral to community care management.  Subjective: client reports history of DM, HD,MI, HTN. Client reports he has been seeing Dr. [name] cardiologist for primary care because he provider retired. And states Dr. [name] has been managing his diabetes. Client has an upcoming appointment in July, with Dr. Luetta Nutting, but has not established care with him yet. Client expressed concern that he may fall into the "donut hole" with being on Januvia. Client is receptive to pharmacy referral to discuss. Checks blood sugar monthly, but states he will start checking more often. He states his blood sugar does not fall below 70. Client reports he will follow up with healthcare concierge regarding glucose meter benefit questions.   Goals Addressed            This Visit's Progress   .  Acknowledge receipt of Programme researcher, broadcasting/film/video      . Advanced Care Planning complete as directed by client within the next 6 months.      . Client understands the importance of follow-up with providers by attending scheduled visits      . Client will report no worsening of symptoms related to heart disease within the next 6 months.      . Client will use Assistive Devices as needed and verbalize understanding of device use      . Client will verbalize knowledge of self management of Hypertension as evidences by BP reading of 140/90 or less; or as defined by  provider      . HEMOGLOBIN A1C < 7.0      . Maintain timely refills of diabetic medication as prescribed within the year .      Marland Kitchen Obtain annual  Lipid Profile, LDL-C      . COMPLETED: Obtain Annual Eye (retinal)  Exam        Completed 08/20/2018.    Marland Kitchen Obtain Annual Foot Exam      . Obtain annual screen for micro albuminuria (urine) , nephropathy (kidney problems)      . Obtain Hemoglobin A1C at least 2 times per year      . Visit Primary Care Provider or Endocrinologist at least 2 times per year         Covid 19 precautions discussed. Client encouraged to call 24 hour nurse advice line if needed. Client also encouraged to call Healthcare concierge for benefits questions. Client encouraged to call RNCM as needed.  Plan:  Send successful outreach letter with a copy of their individualized care plan, Send individual care plan to provider and Send educational material. Chronic care management coordination- will outreach in: 3 months after client establishes care with primary care. Will refer client to:  Pharmacy  Thea Silversmith, RN, MSN, [place] [phone]

## 2018-08-29 ENCOUNTER — Other Ambulatory Visit: Payer: Self-pay | Admitting: Pharmacist

## 2018-08-29 NOTE — Patient Outreach (Signed)
[place] [facility]) Care Management  08/29/2018  [name] [birth year] [id number]   Patient was called regarding medication assistance per referral. Unfortunately, the patient did not answer the phone. HIPAA compliant message was left on the patient's voicemail.   Plan: Call patient back in 1-2 weeks due to upcoming [place].   Elayne Guerin, PharmD, [place] Clinical Pharmacist [phone]

## 2018-09-04 ENCOUNTER — Ambulatory Visit: Payer: Self-pay | Admitting: Pharmacist

## 2018-09-04 ENCOUNTER — Other Ambulatory Visit: Payer: Self-pay | Admitting: Pharmacist

## 2018-09-04 NOTE — Patient Outreach (Signed)
[place] [facility]) Care Management  09/04/2018  ANDONI BUSCH [birth year] [id number]  Called patient regarding CSNP medication review and medication assistance evaluation. Unfortunately, he did not answer his phone. The phone rang >20 times with out a voice mail. A message could not be left.  Unsuccessful contact letter was sent 08/29/2018.  Plan: Call patient back in 1 month.   Elayne Guerin, PharmD, [place] Clinical Pharmacist [phone]

## 2018-09-28 ENCOUNTER — Other Ambulatory Visit: Payer: Self-pay | Admitting: Pharmacist

## 2018-09-28 NOTE — Patient Outreach (Signed)
[name]) Care Management  09/28/2018  [name] [birth year] [id number]  Patient was called regarding CSNP med review and medication assistance evaluation. HIPAA identifiers were obtained. Patient said he was on his way out at the time of my call and asked me to call him back next week.  Plan:  Patient will be called back next week.   Elayne Guerin, PharmD, [place] Clinical Pharmacist [phone]

## 2018-10-01 ENCOUNTER — Ambulatory Visit: Payer: Self-pay | Admitting: Pharmacist

## 2018-10-05 ENCOUNTER — Ambulatory Visit: Payer: Self-pay | Admitting: Pharmacist

## 2018-10-05 ENCOUNTER — Other Ambulatory Visit: Payer: Self-pay | Admitting: Pharmacist

## 2018-10-05 NOTE — Patient Outreach (Addendum)
[place] [facility]) Care Management  10/05/2018  GARRELL FLAGG 05/24/1945 [id number]   Patient was called regarding medication assistance and review as he is part of HTA CSNP.  Unfortunately, he did not answer the phone. After > 30 rings, the phone disconnected. Today's call was the 4th unsuccessful phone call.  Plan: Call patient back in 1 month.   Elayne Guerin, PharmD, [place] Clinical Pharmacist [phone]

## 2018-10-23 ENCOUNTER — Ambulatory Visit (INDEPENDENT_AMBULATORY_CARE_PROVIDER_SITE_OTHER): Payer: HMO | Admitting: Family Medicine

## 2018-10-23 VITALS — BP 142/76 | HR 75 | Temp 98.3°F | Resp 18 | Ht 72.0 in | Wt 246.0 lb

## 2018-10-23 DIAGNOSIS — D500 Iron deficiency anemia secondary to blood loss (chronic): Secondary | ICD-10-CM | POA: Diagnosis not present

## 2018-10-23 DIAGNOSIS — K219 Gastro-esophageal reflux disease without esophagitis: Secondary | ICD-10-CM | POA: Diagnosis not present

## 2018-10-23 DIAGNOSIS — E1165 Type 2 diabetes mellitus with hyperglycemia: Secondary | ICD-10-CM

## 2018-10-23 DIAGNOSIS — E119 Type 2 diabetes mellitus without complications: Secondary | ICD-10-CM

## 2018-10-23 DIAGNOSIS — J4520 Mild intermittent asthma, uncomplicated: Secondary | ICD-10-CM

## 2018-10-23 DIAGNOSIS — I2510 Atherosclerotic heart disease of native coronary artery without angina pectoris: Secondary | ICD-10-CM

## 2018-10-23 DIAGNOSIS — C61 Malignant neoplasm of prostate: Secondary | ICD-10-CM

## 2018-10-23 DIAGNOSIS — Z23 Encounter for immunization: Secondary | ICD-10-CM

## 2018-10-23 DIAGNOSIS — I119 Hypertensive heart disease without heart failure: Secondary | ICD-10-CM

## 2018-10-23 DIAGNOSIS — E785 Hyperlipidemia, unspecified: Secondary | ICD-10-CM

## 2018-10-23 DIAGNOSIS — Z1159 Encounter for screening for other viral diseases: Secondary | ICD-10-CM

## 2018-10-23 DIAGNOSIS — Z794 Long term (current) use of insulin: Secondary | ICD-10-CM | POA: Diagnosis not present

## 2018-10-23 DIAGNOSIS — I10 Essential (primary) hypertension: Secondary | ICD-10-CM

## 2018-10-23 LAB — LIPID PANEL
Cholesterol: 144 mg/dL (ref 0–200)
HDL: 29.5 mg/dL — ABNORMAL LOW (ref >39.00)
LDL Cholesterol: 80 mg/dL (ref 0–99)
NonHDL: 114.27
Total CHOL/HDL Ratio: 5
Triglycerides: 173 mg/dL — ABNORMAL HIGH (ref 0.0–149.0)
VLDL: 34.6 mg/dL (ref 0.0–40.0)

## 2018-10-23 LAB — COMPREHENSIVE METABOLIC PANEL
ALT: 8 U/L (ref 0–53)
AST: 9 U/L (ref 0–37)
Albumin: 4.2 g/dL (ref 3.5–5.2)
Alkaline Phosphatase: 76 U/L (ref 39–117)
BUN: 24 mg/dL — ABNORMAL HIGH (ref 6–23)
CO2: 21 mEq/L (ref 19–32)
Calcium: 8.6 mg/dL (ref 8.4–10.5)
Chloride: 110 mEq/L (ref 96–112)
Creatinine, Ser: 1.33 mg/dL (ref 0.40–1.50)
GFR: 63.74 mL/min (ref >60.00)
Glucose, Bld: 150 mg/dL — ABNORMAL HIGH (ref 70–99)
Potassium: 4.7 mEq/L (ref 3.5–5.1)
Sodium: 139 mEq/L (ref 135–145)
Total Bilirubin: 0.4 mg/dL (ref 0.2–1.2)
Total Protein: 7.3 g/dL (ref 6.0–8.3)

## 2018-10-23 LAB — CBC WITH DIFFERENTIAL/PLATELET
Basophils Absolute: 0 10*3/uL (ref 0.0–0.1)
Basophils Relative: 1 % (ref 0.0–3.0)
Eosinophils Absolute: 0.3 10*3/uL (ref 0.0–0.7)
Eosinophils Relative: 6.4 % — ABNORMAL HIGH (ref 0.0–5.0)
HCT: 31.6 % — ABNORMAL LOW (ref 39.0–52.0)
Hemoglobin: 10.9 g/dL — ABNORMAL LOW (ref 13.0–17.0)
Lymphocytes Relative: 23.1 % (ref 12.0–46.0)
Lymphs Abs: 1 10*3/uL (ref 0.7–4.0)
MCHC: 34.4 g/dL (ref 30.0–36.0)
MCV: 94.6 fl (ref 78.0–100.0)
Monocytes Absolute: 0.5 10*3/uL (ref 0.1–1.0)
Monocytes Relative: 11.9 % (ref 3.0–12.0)
Neutro Abs: 2.4 10*3/uL (ref 1.4–7.7)
Neutrophils Relative %: 57.6 % (ref 43.0–77.0)
Platelets: 337 10*3/uL (ref 150.0–400.0)
RBC: 3.34 Mil/uL — ABNORMAL LOW (ref 4.22–5.81)
RDW: 13.4 % (ref 11.5–15.5)
WBC: 4.1 10*3/uL (ref 4.0–10.5)

## 2018-10-23 LAB — TSH: TSH: 1.72 u[IU]/mL (ref 0.35–4.50)

## 2018-10-23 LAB — MICROALBUMIN / CREATININE URINE RATIO
Creatinine,U: 114.1 mg/dL
Microalb Creat Ratio: 3.7 mg/g (ref 0.0–30.0)
Microalb, Ur: 4.2 mg/dL — ABNORMAL HIGH (ref 0.0–1.9)

## 2018-10-23 LAB — PSA: PSA: 0.14 ng/mL (ref 0.10–4.00)

## 2018-10-23 LAB — HEMOGLOBIN A1C: Hgb A1c MFr Bld: 7.1 % — ABNORMAL HIGH (ref 4.6–6.5)

## 2018-10-23 MED ORDER — ALBUTEROL SULFATE HFA 108 (90 BASE) MCG/ACT IN AERS: 2.0000 | INHALATION_SPRAY | Freq: Four times a day (QID) | RESPIRATORY_TRACT | 2 refills | Status: DC | PRN

## 2018-10-23 NOTE — Assessment & Plan Note (Signed)
-  Denies anginal symptoms at this time.  He will continue to follow with Dr. [name] for this.

## 2018-10-23 NOTE — Progress Notes (Signed)
[name] - 73 y.o. male MRN [id number]  Date of birth: [birth year]  Subjective Chief Complaint  Patient presents with  . Establish Care    NP - DM/HTN/Anemia/Asthma    HPI [name] is a 73 y.o. male wit history of CAD s/p MI in 1985, HTN, asthma, prostate cancer, T2DM here today for initial visit with new pcp.    -HTN/CAD: Current treatment with amlodipine, coreg, aldactone, losartan and carvedilol.  He follows with cardiology as well.  BP has been fairly well controlled with current medications.  He remains fairly active.  Denies added salt or high salt foods.  He denies anginal symptoms, headache, palpitations or vision changes.   -T2DM: Current treatment with januvia and glimepiride.  He was previously on metformin however this was discontinued due to concerns regarding his kidneys.  He is unsure of last a1c.  He does check blood sugars at home but doesn't like to due to continuing need stick fingers.    -GERD:  Current treatment with omeprazole.  This is working well for him and he denies any symptoms at this time.   -Asthma:  Reports intermittent wheezing and/or shortness of breath when in dusty environments.  Has used albuterol previously with relief but current inhaler is expired. Denies increased symptoms or night time cough.    -Prostate cancer:  History of prostate cancer.  He is unsure of the type of treatment he had for this.  He is unsure if he has had a recent PSA.  He denies any urinary symptoms at this time.    ROS:  A comprehensive ROS was completed and negative except as noted per HPI  Allergies  Allergen Reactions  . Shellfish Allergy Rash    Past Medical History:  Diagnosis Date  . Arthritis   . Asthma    no inhaler  . Coronary artery disease    cardiologist-  dr spruill; last visit 3 mos ago per pt  . GERD (gastroesophageal reflux disease)   . History of MI (myocardial infarction)    1985  . Hydronephrosis, left   . Hypertension   .  Myocardial infarction ([place])   . Nephrolithiasis    left  . 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 Presence of tooth-root and mandibular implants    lower dental implants  . Prostate cancer ([place])   . Shortness of breath    WITH EXERTION  . Type 2 diabetes mellitus ([place])     Past Surgical History:  Procedure Laterality Date  . BUNIONECTOMY    . CYSTOSCOPY W/ RETROGRADES Left 03/14/2014   Procedure: CYSTOSCOPY WITH LEFT RETROGRADE PYELOGRAM, Left Ureteroscopy, Lweft ureteral Stent No string;  Surgeon: [name], MD;  Location: [facility];  Service: Urology;  Laterality: Left;  . HERNIA REPAIR    . PROSTATE BIOPSY    . TOTAL HIP ARTHROPLASTY Left 03-20-2009  . TOTAL HIP ARTHROPLASTY  04/09/2012   Procedure: TOTAL HIP ARTHROPLASTY;  Surgeon: [name], MD;  Location: [facility];  Service: Orthopedics;  Laterality: Right;  . TOTAL KNEE ARTHROPLASTY Left 05-16-2008    Social History   Socioeconomic History  . Marital status: Widowed    Spouse name: Not on file  . Number of children: Not on file  . Years of education: Not on file  . Highest education level: Not on file  Occupational History  . Not on file  Social Needs  . Financial resource strain: Not  hard at all  . Food insecurity    Worry: Never true    Inability: Never true  . Transportation needs    Medical: No    Non-medical: No  Tobacco Use  . Smoking status: Former Smoker    Packs/day: 1.00    Years: 25.00    Pack years: 25.00    Types: Cigarettes    Quit date: 04/11/1984    Years since quitting: 34.5  . Smokeless tobacco: Former Systems developer    Quit date: 04/02/1985  Substance and Sexual Activity  . Alcohol use: Yes    Comment: RARE  . Drug use: No  . Sexual activity: Not Currently  Lifestyle  . Physical activity    Days per week: Not on file    Minutes per session: Not on file  . Stress: To some extent  Relationships  . Social Herbalist on phone: Not on file     Gets together: Not on file    Attends religious service: Not on file    Active member of club or organization: Not on file    Attends meetings of clubs or organizations: Not on file    Relationship status: Not on file  Other Topics Concern  . Not on file  Social History Narrative  . Not on file    Family History  Problem Relation Age of Onset  . Cancer Mother        breast  . Multiple sclerosis Daughter   . Cancer Father        prostate  . Cancer Maternal Uncle        bone    Health Maintenance  Topic Date Due  . Hepatitis C Screening  [birth year]  . FOOT EXAM  08/06/1955  . OPHTHALMOLOGY EXAM  08/06/1955  . COLONOSCOPY  08/06/1995  . PNA vac Low Risk Adult (1 of 2 - PCV13) 08/06/2010  . HEMOGLOBIN A1C  12/25/2016  . INFLUENZA VACCINE  11/10/2018  . TETANUS/TDAP  04/10/2025    ----------------------------------------------------------------------------------------------------------------------------------------------------------------------------------------------------------------- Physical Exam BP (!) 142/76   Pulse 75   Temp 98.3 F (36.8 C) (Oral)   Resp 18   Ht 6' (1.829 m)   Wt 246 lb (111.6 kg)   SpO2 97%   BMI 33.36 kg/m   Physical Exam Constitutional:      Appearance: Normal appearance.  HENT:     Head: Normocephalic and atraumatic.     Mouth/Throat:     Mouth: Mucous membranes are moist.  Eyes:     General: No scleral icterus. Neck:     Musculoskeletal: Neck supple.  Cardiovascular:     Rate and Rhythm: Normal rate and regular rhythm.  Pulmonary:     Effort: Pulmonary effort is normal.     Breath sounds: Normal breath sounds.  Musculoskeletal:     Right lower leg: No edema.     Left lower leg: No edema.  Skin:    General: Skin is warm and dry.  Neurological:     General: No focal deficit present.     Mental Status: He is alert.  Psychiatric:        Mood and Affect: Mood normal.        Behavior: Behavior normal.      ------------------------------------------------------------------------------------------------------------------------------------------------------------------------------------------------------------------- Assessment and Plan  Essential hypertension -BP fairly well controlled at this time.   -Recommend continuing current medications.  -Follow low salt diet.   CAD (coronary artery disease) -Denies anginal symptoms at this time.  He will  continue to follow with Dr. [name] for this.   Asthma Mild, intermittent symptoms.  He will continue albuterol as needed, rx renewed.   Uncontrolled type 2 diabetes mellitus with hyperglycemia, without long-term current use of insulin (HCC) -Update a1c -Recommend low carb diet.  -He will continue glimepiride and januvia for now.  Will make adjustment if needed once labs return.  -Prevnar 13 vaccine ordered today however pneumovax 23 was administered.  Will plan to give prevnar next year.      Malignant neoplasm of prostate ([place]) -Update psa  Anemia due to blood loss, chronic Update cbc today.   GERD (gastroesophageal reflux disease) -GERD is stable with omeprazole.

## 2018-10-23 NOTE — Assessment & Plan Note (Addendum)
-  Update a1c -Recommend low carb diet.  -He will continue glimepiride and januvia for now.  Will make adjustment if needed once labs return.  -Prevnar 13 vaccine ordered today however pneumovax 23 was administered.  Will plan to give prevnar next year.

## 2018-10-23 NOTE — Assessment & Plan Note (Signed)
Mild, intermittent symptoms.  He will continue albuterol as needed, rx renewed.

## 2018-10-23 NOTE — Assessment & Plan Note (Signed)
-  BP fairly well controlled at this time.   -Recommend continuing current medications.  -Follow low salt diet.

## 2018-10-23 NOTE — Assessment & Plan Note (Signed)
-  GERD is stable with omeprazole.

## 2018-10-23 NOTE — Assessment & Plan Note (Signed)
-  Update psa

## 2018-10-23 NOTE — Assessment & Plan Note (Signed)
Update cbc today.

## 2018-10-23 NOTE — Patient Instructions (Signed)
Great to meet you! Continue current medications for now We'll be in touch with lab results.

## 2018-10-24 ENCOUNTER — Telehealth: Payer: Self-pay | Admitting: Family Medicine

## 2018-10-24 LAB — HEPATITIS C ANTIBODY
Hepatitis C Ab: NONREACTIVE
SIGNAL TO CUT-OFF: 0.01 (ref <1.00)

## 2018-10-24 NOTE — Telephone Encounter (Signed)
Called patient x2 to inform him that he received pneumovax 23 instead of prevnar 13 which was ordered for him yesterday.  Will continue to try and reach him.

## 2018-10-26 ENCOUNTER — Other Ambulatory Visit: Payer: Self-pay | Admitting: Family Medicine

## 2018-10-26 ENCOUNTER — Telehealth: Payer: Self-pay | Admitting: Family Medicine

## 2018-10-26 MED ORDER — VENTOLIN HFA 108 (90 BASE) MCG/ACT IN AERS: 2.0000 | INHALATION_SPRAY | Freq: Four times a day (QID) | RESPIRATORY_TRACT | 2 refills | Status: DC | PRN

## 2018-10-26 NOTE — Telephone Encounter (Signed)
Called x3 on 7/16 with no answer and x2 on 7/17 without answer and no option for VM.  Will mail letter to have him contact our office.

## 2018-10-26 NOTE — Telephone Encounter (Signed)
Updated to Ventolin HFA, written DAW

## 2018-10-26 NOTE — Telephone Encounter (Signed)
[facility] called in stating that Rx came through as pro-air instead of VENTOLIN HFA. They are stating that the Rx need to be updated to Ventolin and also pt insurance requires a 90 day supply. They are asking how many inhalers to dispense to pt?   CBLoma Sousa : [phone] - secure vm   If leaving a vm please be sure to leave First and Last name

## 2018-10-28 NOTE — Progress Notes (Signed)
-  A1c looks ok, recommend that he continue current diabetes medications -Continue current cholesterol medications.  -He does have some mild anemia but has improved from previous reading we have on file from 2 years ago.   -PSA is normal.

## 2018-11-05 DIAGNOSIS — E119 Type 2 diabetes mellitus without complications: Secondary | ICD-10-CM | POA: Diagnosis not present

## 2018-11-05 DIAGNOSIS — I25118 Atherosclerotic heart disease of native coronary artery with other forms of angina pectoris: Secondary | ICD-10-CM | POA: Diagnosis not present

## 2018-11-05 DIAGNOSIS — I10 Essential (primary) hypertension: Secondary | ICD-10-CM | POA: Diagnosis not present

## 2018-11-05 DIAGNOSIS — E785 Hyperlipidemia, unspecified: Secondary | ICD-10-CM | POA: Diagnosis not present

## 2018-11-06 ENCOUNTER — Other Ambulatory Visit: Payer: Self-pay | Admitting: Pharmacist

## 2018-11-06 ENCOUNTER — Ambulatory Visit: Payer: Self-pay | Admitting: Pharmacist

## 2018-11-06 NOTE — Patient Outreach (Signed)
[place] [facility]) Care Management  11/06/2018  QUINLIN CONANT 08-19-1945 [id number]  Patient was called regarding medication assistance and review as he is part of [facility] CSNP.  Unfortunately, he did not answer the phone after >20 rings.  Today's call was the 5th unsuccessful phone call.  Patient was referred as part of CSNP. He has an appointment with his CSNP Nurse 11/12/2018.  Plan: Call patient back in 3 months.   Elayne Guerin, PharmD, [place] Clinical Pharmacist [phone]

## 2018-11-07 ENCOUNTER — Other Ambulatory Visit: Payer: Self-pay | Admitting: Family Medicine

## 2018-11-09 ENCOUNTER — Other Ambulatory Visit: Payer: Self-pay | Admitting: Family Medicine

## 2018-11-09 ENCOUNTER — Telehealth: Payer: Self-pay

## 2018-11-09 MED ORDER — PROAIR HFA 108 (90 BASE) MCG/ACT IN AERS: 2.0000 | INHALATION_SPRAY | Freq: Four times a day (QID) | RESPIRATORY_TRACT | 3 refills | Status: DC | PRN

## 2018-11-09 NOTE — Telephone Encounter (Signed)
Received PA denial on Ventolin PA. Insurance will cover Proair or [place].

## 2018-11-09 NOTE — Telephone Encounter (Signed)
Ok, will send in as proair again although they had called and requested it be changed to ventolin.

## 2018-11-12 ENCOUNTER — Ambulatory Visit: Payer: Self-pay

## 2018-11-16 ENCOUNTER — Other Ambulatory Visit: Payer: Self-pay

## 2018-11-16 DIAGNOSIS — K640 First degree hemorrhoids: Secondary | ICD-10-CM | POA: Diagnosis not present

## 2018-11-16 DIAGNOSIS — Z86010 Personal history of colonic polyps: Secondary | ICD-10-CM | POA: Diagnosis not present

## 2018-11-16 DIAGNOSIS — K5730 Diverticulosis of large intestine without perforation or abscess without bleeding: Secondary | ICD-10-CM | POA: Diagnosis not present

## 2018-11-16 NOTE — Patient Outreach (Signed)
  [place] [facility]) Care Management Chronic Special Needs Program    11/16/2018  Name: [name], DOB: 1946/02/13  MRN: [id number]   [name] is enrolled in a chronic special needs plan for Diabetes. RNCM called to follow up. No answer. Unable to leave message.  Plan: RNCM will outreach in 1-2 weeks.  Thea Silversmith, RN, MSN, [place] [phone]

## 2018-11-19 ENCOUNTER — Telehealth: Payer: Self-pay | Admitting: Family Medicine

## 2018-11-19 NOTE — Telephone Encounter (Signed)
Pt call stating his insurance change when he was in the office of 10/23/2018 with Dr. Zigmund Daniel. Pt was wondering if we can change the insurance, bill his lab/ov for that day again. Please give pt a call once this is done or with any questions. Cell phone # [phone]  Pt has [employer]age ID [id number] Group # [phone]

## 2018-11-20 ENCOUNTER — Other Ambulatory Visit: Payer: Self-pay

## 2018-11-20 NOTE — Patient Outreach (Signed)
  [place] [facility]) Care Management Chronic Special Needs Program    11/20/2018  Name: [name], DOB: Dec 01, 1945  MRN: [id number]   [name] is enrolled in a chronic special needs plan for Diabetes. RNCM called to follow up and review individualized care plan. No answer. Unable to leave message. 2nd outreach attempt.  Plan: RNCM will outreach within 2 weeks.  Thea Silversmith, RN, MSN, [place] [phone]

## 2018-11-20 NOTE — Telephone Encounter (Signed)
I called and left message on patient voicemail that insurance has been updated to [facility] Advantage and they can contact billing if they receive a bill to resend to [facility].

## 2018-11-23 DIAGNOSIS — H04123 Dry eye syndrome of bilateral lacrimal glands: Secondary | ICD-10-CM | POA: Diagnosis not present

## 2018-11-23 DIAGNOSIS — Z961 Presence of intraocular lens: Secondary | ICD-10-CM | POA: Diagnosis not present

## 2019-01-17 ENCOUNTER — Other Ambulatory Visit: Payer: Self-pay

## 2019-01-17 NOTE — Patient Outreach (Addendum)
  [place] [facility]) Care Management Chronic Special Needs Program  01/17/2019  Name: [name] DOB: 1946/01/25  MRN: [id number]  Mr. Savan Carreau is enrolled in a chronic special needs plan.  RNCM called to follow up. Person answering phone states client was not in. HIPAA compliant message left. Client  Called back prior to [facility] completing note.   Subjective: Client states he has established care with primary care and has an upcoming appointment next week. Client reports he is in the donut hole and is unable to afford Januvia. He states he has samples from his provider. Last A1C 7.1 on 10/23/2018. Client with questions regarding glucose meter covered by plan. Client also has questions regarding a bill he received.    Reviewed and updated care plan.   Goals Addressed            This Visit's Progress   .  Acknowledge receipt of Advanced Directive package   On track   . Advanced Care Planning complete as directed by client within the next 6 months.   On track   . COMPLETED: Client understands the importance of follow-up with providers by attending scheduled visits       Voiced importance of following up with providers.    . COMPLETED: Client will report no worsening of symptoms related to heart disease within the next 6 months.       Denies any worsening symptoms.    . COMPLETED: Client will use Assistive Devices as needed and verbalize understanding of device use       Please take glucose meter to provider office so they can assist with recommendations regarding your blood sugar.     . Client will verbalize knowledge of self management of Hypertension as evidences by BP reading of 140/90 or less; or as defined by provider   On track   . Maintain timely refills of diabetic medication as prescribed within the year .   Not on track   . COMPLETED: Obtain annual  Lipid Profile, LDL-C       Done 10/23/2018    . Obtain Annual Foot Exam   On track   . Obtain annual  screen for micro albuminuria (urine) , nephropathy (kidney problems)   On track   . Obtain Hemoglobin A1C at least 2 times per year   On track    Done 10/23/2018    . Visit Primary Care Provider or Endocrinologist at least 2 times per year    On track     Client warm transferred to healthcare concierge regarding benefits questions. Client encouraged to call RNCM as needed.   RNCM will follow up with client within the next 1-2 months. RNCM will update [facility] pharmacist.  Thea Silversmith, RN, MSN, [place] [phone]  Addendum: client expressed difficulty with checking blood sugar stating it is hard for him to get the blood out of his finger. RNCM reviewed techniques for checking blood sugar including running warm water over fingers. Checking setting on lancet device for depth, holding fingers down to allow blood to flow into fingers. Client encouraged to take meter into provider at next appointment(next week). Also encouraged client to get updated meter. Educational material sent.  Thea Silversmith, RN, MSN, [place] [phone]     .

## 2019-01-18 ENCOUNTER — Telehealth: Payer: Self-pay | Admitting: Pharmacist

## 2019-01-18 NOTE — Patient Outreach (Addendum)
[place] [facility]) Care Management  [place]   01/18/2019  [name] 1945/06/26 [id number]  Reason for referral: medication assistane   Referral source: CSNP Nurse Referral medication(s): Januvia Current insurance:HTA /SCNP  HPI:  Patient is a 73 year old male with a medical history significant for:  Anemia, asthma, CAD, GERD, hyperlipidemia, osteoarthritis of hip, and type 2 diabetes.  Objective: Allergies  Allergen Reactions  . Shellfish Allergy Rash    Medications Reviewed Today    Reviewed by Elayne Guerin, [facility] (Pharmacist) on 01/18/19 at 1345  Med List Status: <None>  Medication Order Taking? Sig Documenting Provider Last Dose Status Informant  amLODipine (NORVASC) 10 MG tablet [id number] Yes Take 10 mg by mouth daily. [provider] Taking Active Self  aspirin 81 MG chewable tablet [id number] Yes Chew 81 mg by mouth daily. [provider] Taking Active Self           Med Note Anselm Lis Aug 27, 2018  5:23 PM) Client reports enteric coated.  atorvastatin (LIPITOR) 40 MG tablet [id number] Yes Take 1 tablet (40 mg total) by mouth daily. Orson Eva, MD Taking Active Self  carvedilol (COREG) 25 MG tablet [id number] Yes Take 25 mg by mouth 2 (two) times daily with a meal. [provider] Taking Active Self           Med Note (MASSIE, AMANDA R   Thu Jul 25, 2013  8:45 AM) .  co-enzyme Q-10 30 MG capsule [id number] Yes Take 30 mg by mouth daily. [provider] Taking Active Self  glimepiride (AMARYL) 4 MG tablet [id number] Yes Take 4 mg by mouth daily before breakfast. [provider] Taking Active Self        Discontinued 01/18/19 1343 (Completed Course)   hydrochlorothiazide (HYDRODIURIL) 25 MG tablet [id number] Yes Take 25 mg by mouth daily.  [provider] Taking Active   IRON PO [id number] Yes Take 65 mg by mouth daily.  [provider] Taking Active Self  losartan (COZAAR) 100 MG  tablet [id number] Yes Take 100 mg by mouth daily.  [provider] Taking Active Self  omeprazole (PRILOSEC) 40 MG capsule [id number] Yes Take 1 capsule (40 mg total) by mouth 2 (two) times daily. X 1 month, then once daily Tat, David, MD Taking Active Self  PROAIR HFA [phone] Base) MCG/ACT inhaler [id number] Yes Inhale 2 puffs into the lungs every 6 (six) hours as needed for wheezing or shortness of breath. Luetta Nutting, DO Taking Active   sitaGLIPtin (JANUVIA) 50 MG tablet [id number] Yes Take 1 tablet (50 mg total) by mouth daily. Orson Eva, MD Taking Active Self  spironolactone (ALDACTONE) 25 MG tablet [id number] Yes Take 25 mg by mouth daily. [provider] Taking Active Self          Assessment:  Drugs sorted by system:  Cardiovascular: Amlodipine, Aspirin, Atorvastatin, Carvedilol, HCTZ, Losartan, Spironolactone  Pulmonary/Allergy: ProAir HFA  Gastrointestinal: Omeprazole  Endocrine: Glimepiride. Stiagliptin  Vitamins/Minerals/Supplements: CoEnzyme Q-10, Iron  Medication Review Findings:  . HgA1c -7.1% - On statin (Atorvastatin last filled 12/26/2018 for a 90 day supply) o LDL 80--10/23/18  Medication Assistance Findings:  Medication assistance needs identified: Januvia   Over Income for LIS/Extra Help  Patient reported being right on the edge of the income cut off for [facility] Patient Assistance Program. He said he will consult his most recent tax return but was driving at the time of our call  Patient will  be sent a [employer] Application just in case.   Additional medication assistance options reviewed with patient as warranted:  No other options identified  Plan: I will route patient assistance letter to [place] technician who will coordinate patient assistance program application process for medications listed above.  [facility] pharmacy technician will assist with obtaining all required documents from both patient and provider(s) and submit  application(s) once completed.   Follow up with the patient in 2 weeks about income.  Elayne Guerin, PharmD, [place] Clinical Pharmacist [phone]

## 2019-01-21 ENCOUNTER — Telehealth: Payer: Self-pay | Admitting: Family Medicine

## 2019-01-21 NOTE — Telephone Encounter (Signed)

## 2019-01-22 ENCOUNTER — Encounter: Payer: Self-pay | Admitting: Family Medicine

## 2019-01-22 ENCOUNTER — Ambulatory Visit (INDEPENDENT_AMBULATORY_CARE_PROVIDER_SITE_OTHER): Payer: HMO | Admitting: Family Medicine

## 2019-01-22 ENCOUNTER — Other Ambulatory Visit: Payer: Self-pay

## 2019-01-22 ENCOUNTER — Other Ambulatory Visit: Payer: Self-pay | Admitting: Pharmacy Technician

## 2019-01-22 VITALS — BP 140/72 | HR 68 | Temp 98.2°F | Ht 72.0 in | Wt 241.6 lb

## 2019-01-22 DIAGNOSIS — E1165 Type 2 diabetes mellitus with hyperglycemia: Secondary | ICD-10-CM | POA: Diagnosis not present

## 2019-01-22 DIAGNOSIS — I2510 Atherosclerotic heart disease of native coronary artery without angina pectoris: Secondary | ICD-10-CM

## 2019-01-22 DIAGNOSIS — I739 Peripheral vascular disease, unspecified: Secondary | ICD-10-CM

## 2019-01-22 DIAGNOSIS — I10 Essential (primary) hypertension: Secondary | ICD-10-CM

## 2019-01-22 DIAGNOSIS — Z23 Encounter for immunization: Secondary | ICD-10-CM | POA: Diagnosis not present

## 2019-01-22 LAB — BASIC METABOLIC PANEL
BUN: 28 mg/dL — ABNORMAL HIGH (ref 6–23)
CO2: 22 mEq/L (ref 19–32)
Calcium: 8.9 mg/dL (ref 8.4–10.5)
Chloride: 109 mEq/L (ref 96–112)
Creatinine, Ser: 1.3 mg/dL (ref 0.40–1.50)
GFR: 65.39 mL/min (ref >60.00)
Glucose, Bld: 139 mg/dL — ABNORMAL HIGH (ref 70–99)
Potassium: 5 mEq/L (ref 3.5–5.1)
Sodium: 138 mEq/L (ref 135–145)

## 2019-01-22 LAB — HEMOGLOBIN A1C: Hgb A1c MFr Bld: 7.1 % — ABNORMAL HIGH (ref 4.6–6.5)

## 2019-01-22 NOTE — Patient Instructions (Signed)
Diabetes Mellitus and Exercise Exercising regularly is important for your overall health, especially when you have diabetes (diabetes mellitus). Exercising is not only about losing weight. It has many other health benefits, such as increasing muscle strength and bone density and reducing body fat and stress. This leads to improved fitness, flexibility, and endurance, all of which result in better overall health. Exercise has additional benefits for people with diabetes, including:  Reducing appetite.  Helping to lower and control blood glucose.  Lowering blood pressure.  Helping to control amounts of fatty substances (lipids) in the blood, such as cholesterol and triglycerides.  Helping the body to respond better to insulin (improving insulin sensitivity).  Reducing how much insulin the body needs.  Decreasing the risk for heart disease by: ? Lowering cholesterol and triglyceride levels. ? Increasing the levels of good cholesterol. ? Lowering blood glucose levels. What is my activity plan? Your health care provider or certified diabetes educator can help you make a plan for the type and frequency of exercise (activity plan) that works for you. Make sure that you:  Do at least 150 minutes of moderate-intensity or vigorous-intensity exercise each week. This could be brisk walking, biking, or water aerobics. ? Do stretching and strength exercises, such as yoga or weightlifting, at least 2 times a week. ? Spread out your activity over at least 3 days of the week.  Get some form of physical activity every day. ? Do not go more than 2 days in a row without some kind of physical activity. ? Avoid being inactive for more than 30 minutes at a time. Take frequent breaks to walk or stretch.  Choose a type of exercise or activity that you enjoy, and set realistic goals.  Start slowly, and gradually increase the intensity of your exercise over time. What do I need to know about managing my  diabetes?   Check your blood glucose before and after exercising. ? If your blood glucose is 240 mg/dL (13.3 mmol/L) or higher before you exercise, check your urine for ketones. If you have ketones in your urine, do not exercise until your blood glucose returns to normal. ? If your blood glucose is 100 mg/dL (5.6 mmol/L) or lower, eat a snack containing 15-20 grams of carbohydrate. Check your blood glucose 15 minutes after the snack to make sure that your level is above 100 mg/dL (5.6 mmol/L) before you start your exercise.  Know the symptoms of low blood glucose (hypoglycemia) and how to treat it. Your risk for hypoglycemia increases during and after exercise. Common symptoms of hypoglycemia can include: ? Hunger. ? Anxiety. ? Sweating and feeling clammy. ? Confusion. ? Dizziness or feeling light-headed. ? Increased heart rate or palpitations. ? Blurry vision. ? Tingling or numbness around the mouth, lips, or tongue. ? Tremors or shakes. ? Irritability.  Keep a rapid-acting carbohydrate snack available before, during, and after exercise to help prevent or treat hypoglycemia.  Avoid injecting insulin into areas of the body that are going to be exercised. For example, avoid injecting insulin into: ? The arms, when playing tennis. ? The legs, when jogging.  Keep records of your exercise habits. Doing this can help you and your health care provider adjust your diabetes management plan as needed. Write down: ? Food that you eat before and after you exercise. ? Blood glucose levels before and after you exercise. ? The type and amount of exercise you have done. ? When your insulin is expected to peak, if you use   insulin. Avoid exercising at times when your insulin is peaking.  When you start a new exercise or activity, work with your health care provider to make sure the activity is safe for you, and to adjust your insulin, medicines, or food intake as needed.  Drink plenty of water while  you exercise to prevent dehydration or heat stroke. Drink enough fluid to keep your urine clear or pale yellow. Summary  Exercising regularly is important for your overall health, especially when you have diabetes (diabetes mellitus).  Exercising has many health benefits, such as increasing muscle strength and bone density and reducing body fat and stress.  Your health care provider or certified diabetes educator can help you make a plan for the type and frequency of exercise (activity plan) that works for you.  When you start a new exercise or activity, work with your health care provider to make sure the activity is safe for you, and to adjust your insulin, medicines, or food intake as needed. This information is not intended to replace advice given to you by your health care provider. Make sure you discuss any questions you have with your health care provider. Document Released: 06/18/2003 Document Revised: 10/20/2016 Document Reviewed: 09/07/2015 Elsevier Patient Education  2020 Elsevier Inc.  

## 2019-01-22 NOTE — Assessment & Plan Note (Signed)
-  BP is well controlled at this time, continue current medications.

## 2019-01-22 NOTE — Patient Outreach (Signed)
[facility]) Care Management  01/22/2019  RUBEL BLONDIN [birth year] [id number]                                       Medication Assistance Referral  Referral From: [place]  Medication/Company: Celesta Gentile / [employer]ck Patient application portion:  Education officer, museum portion: Magazine features editor to [employer] verified via: [employer]    Follow up:  Will follow up with patient in 5-10 business days to confirm application(s) have been received.  Terissa Haffey P. Willo Yoon, [place] Management [phone]

## 2019-01-22 NOTE — Assessment & Plan Note (Signed)
-  Stable, denies anginal symptoms.  He continues to follow with Dr. [name].

## 2019-01-22 NOTE — Assessment & Plan Note (Signed)
-  ABI ordered for bilateral LE.   -encouraged to walk as much as possible.

## 2019-01-22 NOTE — Progress Notes (Signed)
[name] - 73 y.o. male MRN [id number]  Date of birth: 1945-11-28  Subjective Chief Complaint  Patient presents with  . Follow-up    Follow up on DM,HTN, & HDL. Pt says both of his legs are weak and always feel tired.    HPI [name] is a 73 y.o. male with history of HTN, HLD, CAD, and T2DM here today for follow up.   -T2DM:  Current management with [place] and amaryl.  Last a1c of 7.1%.  Reports he is doing well at this time.  Blood sugars are consistently between 115-150.  He denies symptoms of hypoglycemia.    -HTN/HLD/CAD:  He is followed by Dr. [name] for monitoring of CAD.  His BP has remained well controlled with current medications.  He denies side effects including symptoms of hypotension.  He is compliant with atorvastatin and denies myalgias with this.  He had not had any anginal symptoms. He does reports increased fatigue feeling and cramping sensation in legs, especially after walking for a long period of time.  He denies low back pain, numbness or tingling.  He reports good sensation in feet.    ROS:  A comprehensive ROS was completed and negative except as noted per HPI  Allergies  Allergen Reactions  . Shellfish Allergy Rash    Past Medical History:  Diagnosis Date  . Arthritis   . Asthma    no inhaler  . Coronary artery disease    cardiologist-  dr spruill; last visit 3 mos ago per pt  . GERD (gastroesophageal reflux disease)   . History of MI (myocardial infarction)    1985  . Hydronephrosis, left   . Hypertension   . Myocardial infarction ([place])   . Nephrolithiasis    left  . 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 Presence of tooth-root and mandibular implants    lower dental implants  . Prostate cancer ([place])   . Shortness of breath    WITH EXERTION  . Type 2 diabetes mellitus ([place])     Past Surgical History:  Procedure Laterality Date  . BUNIONECTOMY    . CYSTOSCOPY W/ RETROGRADES Left 03/14/2014   Procedure:  CYSTOSCOPY WITH LEFT RETROGRADE PYELOGRAM, Left Ureteroscopy, [place] ureteral Stent No string;  Surgeon: [name], MD;  Location: [facility];  Service: Urology;  Laterality: Left;  . HERNIA REPAIR    . PROSTATE BIOPSY    . TOTAL HIP ARTHROPLASTY Left 03-20-2009  . TOTAL HIP ARTHROPLASTY  04/09/2012   Procedure: TOTAL HIP ARTHROPLASTY;  Surgeon: [name], MD;  Location: [facility];  Service: Orthopedics;  Laterality: Right;  . TOTAL KNEE ARTHROPLASTY Left 05-16-2008    Social History   Socioeconomic History  . Marital status: Widowed    Spouse name: Not on file  . Number of children: Not on file  . Years of education: Not on file  . Highest education level: Not on file  Occupational History  . Not on file  Social Needs  . Financial resource strain: Not hard at all  . Food insecurity    Worry: Never true    Inability: Never true  . Transportation needs    Medical: No    Non-medical: No  Tobacco Use  . Smoking status: Former Smoker    Packs/day: 1.00    Years: 25.00    Pack years: 25.00    Types: Cigarettes    Quit date: 04/11/1984  Years since quitting: 34.8  . Smokeless tobacco: Former Systems developer    Quit date: 04/02/1985  Substance and Sexual Activity  . Alcohol use: Yes    Comment: RARE  . Drug use: No  . Sexual activity: Not Currently  Lifestyle  . Physical activity    Days per week: Not on file    Minutes per session: Not on file  . Stress: To some extent  Relationships  . Social Herbalist on phone: Not on file    Gets together: Not on file    Attends religious service: Not on file    Active member of club or organization: Not on file    Attends meetings of clubs or organizations: Not on file    Relationship status: Not on file  Other Topics Concern  . Not on file  Social History Narrative  . Not on file    Family History  Problem Relation Age of Onset  . Cancer Mother        breast  . Multiple sclerosis Daughter   . Cancer  Father        prostate  . Cancer Maternal Uncle        bone    Health Maintenance  Topic Date Due  . FOOT EXAM  08/06/1955  . OPHTHALMOLOGY EXAM  08/06/1955  . COLONOSCOPY  08/06/1995  . INFLUENZA VACCINE  11/10/2018  . HEMOGLOBIN A1C  04/25/2019  . PNA vac Low Risk Adult (2 of 2 - PCV13) 10/23/2019  . TETANUS/TDAP  04/10/2025  . Hepatitis C Screening  Completed    ----------------------------------------------------------------------------------------------------------------------------------------------------------------------------------------------------------------- Physical Exam BP 140/72   Pulse 68   Temp 98.2 F (36.8 C)   Ht 6' (1.829 m)   Wt 241 lb 9.6 oz (109.6 kg)   SpO2 99%   BMI 32.77 kg/m   Physical Exam Constitutional:      Appearance: Normal appearance.  HENT:     Head: Normocephalic and atraumatic.     Mouth/Throat:     Mouth: Mucous membranes are moist.  Eyes:     General: No scleral icterus. Neck:     Musculoskeletal: Neck supple.  Cardiovascular:     Rate and Rhythm: Normal rate and regular rhythm.     Comments: Diminished PT pulses bilaterally.   Pulmonary:     Effort: Pulmonary effort is normal.     Breath sounds: Normal breath sounds.  Musculoskeletal:     Right lower leg: No edema.     Left lower leg: No edema.  Skin:    General: Skin is warm and dry.  Neurological:     General: No focal deficit present.     Mental Status: He is alert.  Psychiatric:        Mood and Affect: Mood normal.        Behavior: Behavior normal.    Diabetic Foot Exam - Simple   Simple Foot Form Diabetic Foot exam was performed with the following findings: Yes 01/22/2019  9:05 AM  Visual Inspection See comments: Yes Sensation Testing Intact to touch and monofilament testing bilaterally: Yes Pulse Check See comments: Yes Comments Feet dry without ulcerations or significant calluses.   Diminished PT pulse b/l       ------------------------------------------------------------------------------------------------------------------------------------------------------------------------------------------------------------------- Assessment and Plan  Essential hypertension -BP is well controlled at this time, continue current medications.  CAD (coronary artery disease) -Stable, denies anginal symptoms.  He continues to follow with Dr. [name].    Uncontrolled type 2 diabetes mellitus with  hyperglycemia, without long-term current use of insulin (HCC) -Last a1c improved.  Will update today and make adjustments to medications as needed.  -Recommend that he follow a low carb diet with regular exercise.   Intermittent claudication (HCC) -ABI ordered for bilateral LE.   -encouraged to walk as much as possible.    Discussed with him that he received incorrect vaccine at previous appt.  Should have received prevnar but received pneumovax.  Discussed that no harm should come of this but should be aware that he should receive prevnar next year instead of pneumovax.  He expresses understanding and has no questions.   >45 minutes spent with patient with 50% of time spent providing counseling and coordination of care as outlined above.

## 2019-01-22 NOTE — Assessment & Plan Note (Signed)
-  Last a1c improved.  Will update today and make adjustments to medications as needed.  -Recommend that he follow a low carb diet with regular exercise.

## 2019-01-29 NOTE — Progress Notes (Signed)
A1c is stable.  I recommend he continue current medications.

## 2019-01-30 ENCOUNTER — Other Ambulatory Visit: Payer: Self-pay | Admitting: Pharmacy Technician

## 2019-01-30 NOTE — Patient Outreach (Signed)
[place] [facility]) Care Management  01/30/2019  DAEKWON FOUGHT 1945/04/19 [id number]    Successful outreach call placed to patient in regards to [place] application for [place].  Spoke to patient, HIPAA identifers verified.  Patient informed he thinks he has received the application. He has not filled it out as he informed he will not qualify as he makes more the patient assistance company requirement for the program.  He inquired as to how the doughnut hole and processes work. Discussed with patient the 2020 coverage for Medicare D enrollees and the different coverage stages. Patient also informed he may need to change insurances for 2021. Informed patient that Medicare D open enrollment ha started. Provided patient the [facility] resource with the [employer] as a means to discuss his different options for Medicare D. Patient informed he would outreach his provider for samples to get him through the remainder of the year. Patient verbalized understanding and was thankful for the information provided.  Will route note to [place] that patient has decided not to participate in the patient assistance process. Will remove myself form care team.  [name]. Teegan Guinther, [place] Management [phone]

## 2019-02-01 ENCOUNTER — Telehealth (HOSPITAL_COMMUNITY): Payer: Self-pay

## 2019-02-01 NOTE — Telephone Encounter (Signed)

## 2019-02-04 ENCOUNTER — Ambulatory Visit (HOSPITAL_COMMUNITY)
Admission: RE | Admit: 2019-02-04 | Discharge: 2019-02-04 | Disposition: A | Discharge status: Home / Self Care | Payer: HMO | Source: Ambulatory Visit | Attending: Family | Admitting: Family

## 2019-02-04 ENCOUNTER — Encounter: Payer: Self-pay | Admitting: Pharmacist

## 2019-02-04 ENCOUNTER — Other Ambulatory Visit: Payer: Self-pay

## 2019-02-04 DIAGNOSIS — I739 Peripheral vascular disease, unspecified: Secondary | ICD-10-CM | POA: Diagnosis not present

## 2019-02-04 DIAGNOSIS — I10 Essential (primary) hypertension: Secondary | ICD-10-CM | POA: Diagnosis not present

## 2019-02-04 DIAGNOSIS — I25119 Atherosclerotic heart disease of native coronary artery with unspecified angina pectoris: Secondary | ICD-10-CM | POA: Diagnosis not present

## 2019-02-04 DIAGNOSIS — E119 Type 2 diabetes mellitus without complications: Secondary | ICD-10-CM | POA: Diagnosis not present

## 2019-02-04 DIAGNOSIS — E785 Hyperlipidemia, unspecified: Secondary | ICD-10-CM | POA: Diagnosis not present

## 2019-02-05 ENCOUNTER — Ambulatory Visit: Payer: Self-pay | Admitting: Pharmacist

## 2019-02-28 ENCOUNTER — Other Ambulatory Visit: Payer: Self-pay

## 2019-02-28 NOTE — Patient Outreach (Signed)
  [facility]) Care Management Chronic Special Needs Program  02/28/2019  Name: [name] DOB: [birth year]  MRN: [id number]  [name] is enrolled in a chronic special needs plan. RNCM called to follow up.   Subjective: client reports he has his medications and is taking his medications as prescribed. Client states he does not have a glucose meter and is interested in a meter that you don't have to stick yourself as much. Client states he will call his primary care to request. Last A1C 7.1 on 01/22/2019.   Goals Addressed            This Visit's Progress   .  Acknowledge receipt of Advanced Directive package   On track   . Advanced Care Planning complete as directed by client within the next 6 months.   On track   . Client will verbalize knowledge of self management of Hypertension as evidences by BP reading of 140/90 or less; or as defined by provider   On track   . Maintain timely refills of diabetic medication as prescribed within the year .   On track   . COMPLETED: Obtain Annual Foot Exam       Done 01/22/2019    . COMPLETED: Obtain annual screen for micro albuminuria (urine) , nephropathy (kidney problems)       Done 10/23/2018    . COMPLETED: Obtain Hemoglobin A1C at least 2 times per year       Done 10/23/2018 and 01/22/2019    . Visit Primary Care Provider or Endocrinologist at least 2 times per year    On track     Covid 19 precautions reviewed. Reinforced availability of the 24 hour nurse advice line and health care concierge for benefits questions. RNCM encouraged client to call as needed.  RNCM discussed importance of checking his blood sugar. Client verbalized understanding.   Plan: RNCM will send updated care plan to client and primary care provider. RNCM will follow up with client within the next 3-4 months.   Thea Silversmith, RN, MSN, [place] [phone]   .

## 2019-03-13 ENCOUNTER — Ambulatory Visit: Payer: Self-pay | Admitting: Pharmacist

## 2019-04-23 ENCOUNTER — Telehealth: Payer: Self-pay

## 2019-04-23 NOTE — Telephone Encounter (Signed)
Questions for Screening COVID-19    Travel or Contacts: no  During this illness, did/does the patient experience any of the following symptoms? Fever >100.45F []   Yes [x]   No []   Unknown Subjective fever (felt feverish) []   Yes [x]   No []   Unknown Chills []   Yes [x]   No []   Unknown Muscle aches (myalgia) []   Yes [x]   No []   Unknown Runny nose (rhinorrhea) []   Yes [x]   No []   Unknown Sore throat []   Yes [x]   No []   Unknown Cough (new onset or worsening of chronic cough) []   Yes [x]   No []   Unknown Shortness of breath (dyspnea) []   Yes [x]   No []   Unknown Nausea or vomiting []   Yes [x]   No []   Unknown Headache []   Yes [x]   No []   Unknown Abdominal pain  []   Yes [x]   No []   Unknown Diarrhea (?3 loose/looser than normal stools/24hr period) []   Yes [x]   No []   Unknown

## 2019-04-24 ENCOUNTER — Ambulatory Visit (INDEPENDENT_AMBULATORY_CARE_PROVIDER_SITE_OTHER): Payer: HMO | Admitting: Family Medicine

## 2019-04-24 ENCOUNTER — Encounter: Payer: Self-pay | Admitting: Family Medicine

## 2019-04-24 ENCOUNTER — Other Ambulatory Visit: Payer: Self-pay

## 2019-04-24 VITALS — BP 138/64 | HR 73 | Temp 97.8°F | Ht 73.0 in | Wt 239.6 lb

## 2019-04-24 DIAGNOSIS — E1165 Type 2 diabetes mellitus with hyperglycemia: Secondary | ICD-10-CM

## 2019-04-24 DIAGNOSIS — I10 Essential (primary) hypertension: Secondary | ICD-10-CM | POA: Diagnosis not present

## 2019-04-24 DIAGNOSIS — Z23 Encounter for immunization: Secondary | ICD-10-CM

## 2019-04-24 DIAGNOSIS — Z1211 Encounter for screening for malignant neoplasm of colon: Secondary | ICD-10-CM | POA: Diagnosis not present

## 2019-04-24 DIAGNOSIS — J4520 Mild intermittent asthma, uncomplicated: Secondary | ICD-10-CM

## 2019-04-24 DIAGNOSIS — I2510 Atherosclerotic heart disease of native coronary artery without angina pectoris: Secondary | ICD-10-CM | POA: Diagnosis not present

## 2019-04-24 NOTE — Patient Instructions (Signed)
COVID-19 Vaccine Information can be found at: [email or web address] For questions related to vaccine distribution or appointments, please email [email or web address] or call [phone].   Follow up with me in about 3 months.

## 2019-04-25 LAB — HEMOGLOBIN A1C: Hgb A1c MFr Bld: 7 % — ABNORMAL HIGH (ref 4.6–6.5)

## 2019-04-26 ENCOUNTER — Telehealth: Payer: Self-pay | Admitting: Family Medicine

## 2019-04-26 NOTE — Telephone Encounter (Signed)
Gregary Signs is calling and stated that she received a medical release form and needed to know how far they need to go back for records. CB is [phone]

## 2019-04-26 NOTE — Telephone Encounter (Signed)
Returned Julia's call and informed her of our time frame that we usually request. She understood and states she will get these faxed to [place]. Nothing further is needed

## 2019-04-28 ENCOUNTER — Encounter: Payer: Self-pay | Admitting: Family Medicine

## 2019-04-28 DIAGNOSIS — Z1211 Encounter for screening for malignant neoplasm of colon: Secondary | ICD-10-CM | POA: Insufficient documentation

## 2019-04-28 NOTE — Assessment & Plan Note (Signed)
Stable, continue pro-air prn.

## 2019-04-28 NOTE — Assessment & Plan Note (Signed)
BP is well controlled at this time, continue current medications.  Recommend low salt diet.

## 2019-04-28 NOTE — Assessment & Plan Note (Signed)
It appear that he was seen by Dr. Nelida Meuse 11/2018 however note is not available through epic.  Will have him sign release for records.

## 2019-04-28 NOTE — Assessment & Plan Note (Signed)
Blood sugars at home are well controlled, a1c at 7.0%.  Continue current medications.

## 2019-04-28 NOTE — Assessment & Plan Note (Signed)
Denies anginal symptoms at this time.  Continue statin and asa.  Keep blood sugar and blood pressure under good control.

## 2019-04-28 NOTE — Progress Notes (Signed)
[name] - 74 y.o. male MRN [id number]  Date of birth: 04/11/1946  Subjective Chief Complaint  Patient presents with  . Follow-up    3 month follow up on diabetes, pt would like to discuss his colonoscopy with you, states that he is due for second pneumonia vaccine.     HPI [name] is a 74 y.o. male with history of T2DM, HTN and CAD here today for routine follow up.   -HTN/CAD:  History of remote MI and HTN.  He is doing well with current medications.  He is compliant with asa and statin.  Last LDL 80 in 10/2018.   He denies anginal symptoms, shortness of breath, palpitations, headache or vision changes.    -T2DM:  Current management with [place] and amaryl.  Last a1c 7.0%.  She denies symptoms of hypoglycemia.  He is not having increased thirst or urination. States that he feels well.  He does limit carbohydrate intake.   -Asthma:  Pro-air as needed.  Denies any worsening of respiratory symptoms.   -Colon cancer screening: He thinks he may be due for updated colon cancer screening based on results of previous exam.  His last test was [facility] at [name].   ROS:  A comprehensive ROS was completed and negative except as noted per HPI    Allergies  Allergen Reactions  . Shellfish Allergy Rash    Past Medical History:  Diagnosis Date  . Arthritis   . Asthma    no inhaler  . Coronary artery disease    cardiologist-  dr spruill; last visit 3 mos ago per pt  . GERD (gastroesophageal reflux disease)   . History of MI (myocardial infarction)    1985  . Hydronephrosis, left   . Hypertension   . Myocardial infarction ([place])   . Nephrolithiasis    left  . 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 Presence of tooth-root and mandibular implants    lower dental implants  . Prostate cancer ([place])   . Shortness of breath    WITH EXERTION  . Type 2 diabetes mellitus ([place])     Past Surgical History:  Procedure Laterality Date  .  BUNIONECTOMY    . CYSTOSCOPY W/ RETROGRADES Left 03/14/2014   Procedure: CYSTOSCOPY WITH LEFT RETROGRADE PYELOGRAM, Left Ureteroscopy, [place] ureteral Stent No string;  Surgeon: Aaron Persons, MD;  Location: [name];  Service: Urology;  Laterality: Left;  . HERNIA REPAIR    . PROSTATE BIOPSY    . TOTAL HIP ARTHROPLASTY Left 03-20-2009  . TOTAL HIP ARTHROPLASTY  04/09/2012   Procedure: TOTAL HIP ARTHROPLASTY;  Surgeon: Aaron Alf, MD;  Location: [facility];  Service: Orthopedics;  Laterality: Right;  . TOTAL KNEE ARTHROPLASTY Left 05-16-2008    Social History   Socioeconomic History  . Marital status: Widowed    Spouse name: Not on file  . Number of children: Not on file  . Years of education: Not on file  . Highest education level: Not on file  Occupational History  . Not on file  Tobacco Use  . Smoking status: Former Smoker    Packs/day: 1.00    Years: 25.00    Pack years: 25.00    Types: Cigarettes    Quit date: 04/11/1984    Years since quitting: 35.0  . Smokeless tobacco: Former Systems developer    Quit date: 04/02/1985  Substance and Sexual Activity  . Alcohol use: Yes  Comment: RARE  . Drug use: No  . Sexual activity: Not Currently  Other Topics Concern  . Not on file  Social History Narrative  . Not on file   Social Determinants of Health   Financial Resource Strain: Low Risk   . Difficulty of Paying Living Expenses: Not hard at all  Food Insecurity: No Food Insecurity  . Worried About Charity fundraiser in the Last Year: Never true  . Ran Out of Food in the Last Year: Never true  Transportation Needs: No Transportation Needs  . Lack of Transportation (Medical): No  . Lack of Transportation (Non-Medical): No  Physical Activity:   . Days of Exercise per Week: Not on file  . Minutes of Exercise per Session: Not on file  Stress: Stress Concern Present  . Feeling of Stress : To some extent  Social Connections:   . Frequency of Communication with  Friends and Family: Not on file  . Frequency of Social Gatherings with Friends and Family: Not on file  . Attends Religious Services: Not on file  . Active Member of Clubs or Organizations: Not on file  . Attends Archivist Meetings: Not on file  . Marital Status: Not on file    Family History  Problem Relation Age of Onset  . Cancer Mother        breast  . Multiple sclerosis Daughter   . Cancer Father        prostate  . Cancer Maternal Uncle        bone    Health Maintenance  Topic Date Due  . OPHTHALMOLOGY EXAM  08/06/1955  . COLONOSCOPY  08/06/1995  . HEMOGLOBIN A1C  10/22/2019  . FOOT EXAM  01/22/2020  . TETANUS/TDAP  04/10/2025  . INFLUENZA VACCINE  Completed  . Hepatitis C Screening  Completed  . PNA vac Low Risk Adult  Completed    ----------------------------------------------------------------------------------------------------------------------------------------------------------------------------------------------------------------- Physical Exam BP 138/64   Pulse 73   Temp 97.8 F (36.6 C) (Tympanic)   Ht 6\' 1"  (1.854 m)   Wt 239 lb 9.6 oz (108.7 kg)   SpO2 97%   BMI 31.61 kg/m   Physical Exam Constitutional:      Appearance: Normal appearance.  Eyes:     General: No scleral icterus. Cardiovascular:     Rate and Rhythm: Normal rate and regular rhythm.  Pulmonary:     Effort: Pulmonary effort is normal.     Breath sounds: Normal breath sounds.  Musculoskeletal:     Cervical back: Neck supple.  Skin:    General: Skin is warm and dry.  Neurological:     General: No focal deficit present.     Mental Status: He is alert.  Psychiatric:        Mood and Affect: Mood normal.        Behavior: Behavior normal.     ------------------------------------------------------------------------------------------------------------------------------------------------------------------------------------------------------------------- Assessment and  Plan  Essential hypertension BP is well controlled at this time, continue current medications.  Recommend low salt diet.   CAD (coronary artery disease) Denies anginal symptoms at this time.  Continue statin and asa.  Keep blood sugar and blood pressure under good control.    Asthma Stable, continue pro-air prn.   Uncontrolled type 2 diabetes mellitus with hyperglycemia, without long-term current use of insulin (HCC) Blood sugars at home are well controlled, a1c at 7.0%.  Continue current medications.   Colon cancer screening It appear that he was seen by Dr. Nelida Meuse 11/2018 however note  is not available through epic.  Will have him sign release for records.     This visit occurred during the SARS-CoV-2 public health emergency.  Safety protocols were in place, including screening questions prior to the visit, additional usage of staff PPE, and extensive cleaning of exam room while observing appropriate contact time as indicated for disinfecting solutions.

## 2019-05-02 ENCOUNTER — Ambulatory Visit: Payer: Self-pay | Admitting: Pharmacist

## 2019-05-02 ENCOUNTER — Other Ambulatory Visit: Payer: Self-pay | Admitting: Pharmacist

## 2019-05-02 NOTE — Patient Outreach (Addendum)
[facility]) Care Management  05/02/2019  [name] 1946/01/28 [id number]   Patient was called for [place] Follow up. Unfortunately, patient did not answer his phone. HIPAA compliant message was left on his voicemail.  Patient declined medication assistance last year.  Plan: Send patient an unsuccessful outreach letter. Call patient back in 3-4 months.  ADDENDUM   Patient called me back.  HIPAA identifiers were obtained.  Patient is a 74 year old male with multiple medical conditions including but not limited to:  CAD, hypertension, GERD, hyperlipidemia, intermittent claudication, osteoarthritis of the hip, and type 2 diabetes.  Patient has HTA-CSNP.  He opted out of patient assistance last year but said he would be interested this year because he made less money last year.  Reviewed patient's medications over the phone: Medications Reviewed Today    Reviewed by Elayne Guerin, [facility] (Pharmacist) on 05/02/19 at 1330  Med List Status: <None>  Medication Order Taking? Sig Documenting Provider Last Dose Status Informant  amLODipine (NORVASC) 10 MG tablet [id number] Yes Take 10 mg by mouth daily. [provider] Taking Active Self  aspirin 81 MG chewable tablet [id number] Yes Chew 81 mg by mouth daily. [provider] Taking Active Self           Med Note Anselm Lis Aug 27, 2018  5:23 PM) Client reports enteric coated.  atorvastatin (LIPITOR) 40 MG tablet [id number] Yes Take 1 tablet (40 mg total) by mouth daily. Orson Eva, MD Taking Active Self  carvedilol (COREG) 25 MG tablet [id number] Yes Take 25 mg by mouth 2 (two) times daily with a meal. [provider] Taking Active Self           Med Note (MASSIE, AMANDA R   Thu Jul 25, 2013  8:45 AM) .  co-enzyme Q-10 30 MG capsule [id number] Yes Take 30 mg by mouth daily. [provider] Taking Active Self  glimepiride (AMARYL) 4 MG tablet [id number] Yes Take 4 mg by mouth  daily before breakfast. [provider] Taking Active Self  hydrochlorothiazide (HYDRODIURIL) 25 MG tablet [id number] Yes Take 25 mg by mouth daily.  [provider] Taking Active   IRON PO [id number] Yes Take 65 mg by mouth daily.  [provider] Taking Active Self  losartan (COZAAR) 100 MG tablet [id number] Yes Take 100 mg by mouth daily.  [provider] Taking Active Self  omeprazole (PRILOSEC) 40 MG capsule [id number] No Take 1 capsule (40 mg total) by mouth 2 (two) times daily. X 1 month, then once daily Tat, David, MD Unknown Active Self  PROAIR HFA 108 [phone] Base) MCG/ACT inhaler [id number] Yes Inhale 2 puffs into the lungs every 6 (six) hours as needed for wheezing or shortness of breath. Luetta Nutting, DO Taking Active   sitaGLIPtin (JANUVIA) 50 MG tablet [id number] Yes Take 1 tablet (50 mg total) by mouth daily. Orson Eva, MD Taking Active Self  spironolactone (ALDACTONE) 25 MG tablet [id number] Yes Take 25 mg by mouth daily. [provider] Taking Active Self          Medication Findings: HgA1c- 7%  On statin-atorvastatin Admitted to not taking Januvia all the time due to cost. Patient said he does not check his blood sugar because he does not like to prick his finger. He wondered if he could get a [employer]. The patient's insurance will pay for a FreeStyle.  (note will be sent to patient's provider)  Medication  Assistance: Patient is over-income for LIS/Extra Help Will send application for Januvia and Proventil HFA   Plan: I will route patient assistance letter to [place] technician who will coordinate patient assistance program application process for medications listed above.  [facility] pharmacy technician will assist with obtaining all required documents from both patient and provider(s) and submit application(s) once completed.    Contact Dr. Zigmund Daniel about FreeStyle Libre  Follow up with the patient in 4-6  weeks.  Elayne Guerin, PharmD, BCACP [facility] Clinical Pharmacist [phone]   Elayne Guerin, PharmD, [facility] Clinical Pharmacist [phone]

## 2019-05-06 DIAGNOSIS — I10 Essential (primary) hypertension: Secondary | ICD-10-CM | POA: Diagnosis not present

## 2019-05-06 DIAGNOSIS — I25118 Atherosclerotic heart disease of native coronary artery with other forms of angina pectoris: Secondary | ICD-10-CM | POA: Diagnosis not present

## 2019-05-06 DIAGNOSIS — E119 Type 2 diabetes mellitus without complications: Secondary | ICD-10-CM | POA: Diagnosis not present

## 2019-05-06 DIAGNOSIS — E785 Hyperlipidemia, unspecified: Secondary | ICD-10-CM | POA: Diagnosis not present

## 2019-05-08 ENCOUNTER — Ambulatory Visit: Payer: Self-pay

## 2019-05-17 ENCOUNTER — Other Ambulatory Visit: Payer: Self-pay

## 2019-05-17 NOTE — Patient Outreach (Signed)
[facility]) Care Management Chronic Special Needs Program  05/17/2019  Name: [name] DOB: [birth year]  MRN: [id number]  [name] is enrolled in a chronic special needs plan for Diabetes. Chronic Care Management Coordinator telephoned client to review health risk assessment and to develop individualized care plan.  Introduced the chronic care management program, importance of client participation, and taking their care plan to all provider appointments and inpatient facilities.    Subjective: client reports he is doing well. He states he states he does not have a blood glucose meter and spoke with primary care at last visit on 04/24/2019 about it. Client states he is interested in getting "a meter where you don't have to stick yourself". 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 Client denies any medication needs at this time and states he has all his medications. But voices that he does not always have to money to pay for medications. He reports he will get samples of Januvia from his doctor. Client denies any issues with transportation.  Goals Addressed            This Visit's Progress   .  Acknowledge receipt of Advanced Directive package   On track    Mailed advanced directive packet. Mailed education article:"Advanced directives". Please review and call if you have any questions.    . COMPLETED: Advanced Care Planning complete as directed by client within the next 6 months.       Client reports has not completed, unsure of where packet is. Is receptive to receiving another packet to be filled out per his time-frame.    . Client verbalizes knowledge of Heart Attack self management skills within the next 6 months.   On track    Mailed education article-"Heart AttacK" Please review and call if you have any questions. Take medications as prescribed Follow up with providers as scheduled.       . Client will report no worsening of  symptoms related to heart disease within the next 6 months.   On track    Attend provider visits as scheduled Education Mailed:"Heart disease in diabetics" Please review and call if you have any questions.    . Client will verbalize knowledge of self management of Hypertension as evidences by BP reading of 140/90 or less; or as defined by provider   On track    Client reports blood pressure less than 140/90. Per primary care note on blood pressure controlled.  Follow up with your doctor as scheduled. Ask you doctor, "what is my target blood pressure range?" Continue to take your medications as prescribed. Mailed high blood pressure education article:"Diabetes and blood pressure". Please review and call if you have any questions.      . Maintain timely refills of diabetic medication as prescribed within the year .   On track    Client reports he has medication changes due to cost, but states he has the insulin he needs and Januvia.  Continue to follow up with [facility] network pharmacist for medication assistance needs. It is important to notify your doctor, the [facility] pharmacist that has been helping you with medication assistance and/or your RN nurse care manager if you are not able to get your medications.    . Obtain annual  Lipid Profile, LDL-C   On track    Done 10/23/2018    . Obtain Annual Eye (retinal)  Exam    On track  Completed 08/20/2018.    [place] Kitchen Obtain Annual Foot Exam   On track    Done 01/22/2019    . Obtain annual screen for micro albuminuria (urine) , nephropathy (kidney problems)   On track    Done 10/23/2018    . Obtain Hemoglobin A1C at least 2 times per year   On track    Done 10/23/2018 and 01/22/2019    . Patient Stated: obtain glucose meter and begin checking blood sugar within the next 3 months.       Follow up with your provider your request for blood sugar meter. Discuss with your doctor how often he would like for you to check  your blood sugar. RN case manager called primary care to request glucose meter. Mailed education: "Diabetes: why check blood sugar?".    . Visit Primary Care Provider or Endocrinologist at least 2 times per year    On track    Primary care seen 01/22/2019;  Last seen 04/24/2019 It is important for you to follow up with your providers as scheduled to get your recommended labs/procedures.      Covid 19 precautions discussed. RNCM encouraged client to call health care concierge for benefits questions. RNCM encouraged client to call RNCM as needed and confirmed client has the contact number.  Care coordination-spoke with [facility] pharmacist, K. Luciana Axe regarding client's request for [employer]. She reports she has sent this request in her notes to primary care. RNCM called provider office, but provider not currently at office. RNCM will follow up next week to request glucose meter.  Plan: RNCM will send care plan to client; send care plan to primary care; send educational material; continue care coordination with Abbey Chatters, RPh as indicated. Chronic care management coordination will follow up in 1-2 months to see if client has obtained glucose meter.     Thea Silversmith, RN, MSN, [place] [phone]

## 2019-05-23 ENCOUNTER — Other Ambulatory Visit: Payer: Self-pay

## 2019-05-23 ENCOUNTER — Other Ambulatory Visit: Payer: Self-pay | Admitting: Family Medicine

## 2019-05-23 MED ORDER — FREESTYLE LIBRE 14 DAY SENSOR MISC: 1.0000 | 1 refills | Status: DC

## 2019-05-23 MED ORDER — FREESTYLE LIBRE 14 DAY READER DEVI: 1.0000 | Freq: Once | 0 refills | Status: AC

## 2019-05-23 NOTE — Patient Outreach (Signed)
  [facility]) Care Management Chronic Special Needs Program    05/23/2019  Name: [name], DOB: Feb 10, 1946  MRN: [id number]   [name] is enrolled in a chronic special needs plan.   Care Coordination: RNCM sent communication to primary requesting Freestyle Libre prescription.  Plan: RNCM will follow up as scheduled.  Thea Silversmith, RN, MSN, [place] [phone]

## 2019-06-06 ENCOUNTER — Ambulatory Visit: Payer: Self-pay | Admitting: Pharmacist

## 2019-06-06 ENCOUNTER — Encounter: Payer: Self-pay | Admitting: Pharmacist

## 2019-06-07 ENCOUNTER — Other Ambulatory Visit: Payer: Self-pay | Admitting: Pharmacy Technician

## 2019-06-07 NOTE — Patient Outreach (Signed)
[place] [facility]) Care Management  06/07/2019  YARO THONE [birth year] [id number]                                        Medication Assistance Referral  Referral From: [place]  Medication/Company: Celesta Gentile and Proventil HFA / [employer] Patient application portion:  Education officer, museum portion: Interoffice Mailed to Dr. Luetta Nutting Provider address/fax verified via: Office website    Follow up:  Will follow up with patient in 5-15 business days to confirm application(s) have been received.  Annelle Behrendt P. Kamalani Mastro, [place] Management [phone]

## 2019-06-18 ENCOUNTER — Other Ambulatory Visit: Payer: Self-pay | Admitting: Family Medicine

## 2019-06-19 ENCOUNTER — Other Ambulatory Visit: Payer: Self-pay

## 2019-06-19 NOTE — Patient Outreach (Signed)
  [facility]) Care Management Chronic Special Needs Program  06/19/2019  Name: [name] DOB: 1946/01/30  MRN: [id number]  [name] is enrolled in a chronic special needs plan for Diabetes. Reviewed and updated care plan.  Subjective: client reports he has picked up his meter(Freestyle libre), "but I gotta learn how to work it". Client states his granddaughter works in pharmacy and is going to show him how to work it". Client states he should have an upcoming appointment with primary care in April, but will have to call to get on his schedule. He also reports he has an appointment with his cardiologist in April. Client states has not been checking his blood sugar because he did not want to stick himself, but states the [employer] is less sticks and he will use the [employer].  Goals Addressed            This Visit's Progress   . Patient Stated: obtain glucose meter and begin checking blood sugar within the next 3 months.       If you have access to a computer or a smart phone, you can view you-tube videos for instructions for how to use.  Your pharmacy may also be able to help you get started using your [facility]. Follow up with your provider if you need additional assistance.       . Visit Primary Care Provider or Endocrinologist at least 2 times per year    On track    It is important for you to follow up with your providers as scheduled to get your recommended labs/procedures. Please call to schedule your next appointment with your primary care provider.      RNCM encouraged client to call as needed.  Plan: RNCM will outreach per Tier level in 6 months.  Send updated care plan to client; send updated care plan to primary care provider.   Thea Silversmith, RN, MSN, [place] [phone]

## 2019-06-28 ENCOUNTER — Other Ambulatory Visit: Payer: Self-pay | Admitting: Pharmacy Technician

## 2019-06-28 ENCOUNTER — Other Ambulatory Visit: Payer: Self-pay | Admitting: Pharmacist

## 2019-06-28 NOTE — Patient Outreach (Signed)
[place] [facility]) Care Management  06/28/2019  [name] [birth year] [id number]   Successful call placed to patient regarding patient assistance application(s) for Januvia and Proventil HFA with [employer] , HIPAA identifiers verified.   Patient informed he was not interested in applying and would not be sending back the application.  Follow up:  Will route note to [place] that patient assistance case is being closed and will remove myself from care team.  [name]. [name], [place]  [phone]

## 2019-06-28 NOTE — Patient Outreach (Signed)
[facility]) Care Management  06/28/2019  [name] [birth year] [id number]   Patient's case will be closed as he told [name], he was not interested in completing the patient assistance process.  Elayne Guerin, PharmD, [place] Clinical Pharmacist [phone]

## 2019-07-08 ENCOUNTER — Ambulatory Visit: Payer: Self-pay | Admitting: Pharmacist

## 2019-07-18 ENCOUNTER — Ambulatory Visit: Payer: Self-pay | Admitting: Pharmacist

## 2019-07-22 ENCOUNTER — Ambulatory Visit (INDEPENDENT_AMBULATORY_CARE_PROVIDER_SITE_OTHER): Payer: HMO | Admitting: Family Medicine

## 2019-07-22 ENCOUNTER — Other Ambulatory Visit: Payer: Self-pay

## 2019-07-22 ENCOUNTER — Encounter: Payer: Self-pay | Admitting: Family Medicine

## 2019-07-22 DIAGNOSIS — I10 Essential (primary) hypertension: Secondary | ICD-10-CM | POA: Diagnosis not present

## 2019-07-22 DIAGNOSIS — E785 Hyperlipidemia, unspecified: Secondary | ICD-10-CM | POA: Diagnosis not present

## 2019-07-22 DIAGNOSIS — E1165 Type 2 diabetes mellitus with hyperglycemia: Secondary | ICD-10-CM

## 2019-07-22 NOTE — Patient Instructions (Signed)
Great to see you today! Continue current medications. See me again in about 3 months.

## 2019-07-22 NOTE — Assessment & Plan Note (Signed)
Blood pressure is at goal at for age and co-morbidities.  I recommend that he continue current medications.  In addition they were instructed to follow a low sodium diet with regular exercise to help to maintain adequate control of blood pressure.   

## 2019-07-22 NOTE — Assessment & Plan Note (Signed)
Tolerating atorvastatin well, continue at current strength.   

## 2019-07-22 NOTE — Assessment & Plan Note (Signed)
Diabetes has been much better controlled recently.  Freestyle libre should help him monitor glucose more closely as well.  Continue current medications.

## 2019-07-22 NOTE — Progress Notes (Signed)
[name] - 74 y.o. male MRN [id number]  Date of birth: [birth year]  Subjective Chief Complaint  Patient presents with  . Diabetes    HPI [name] is a 74 y.o. male with history of HTN, T2DM, CAD, HLD here today for follow up visit.   -T2DM:  Recently picked up freestyle libre system to monitor glucose but has not tried yet.  Instructed on use and sensor placed today.  He reports that he continues to do well with glimepiride and [place].  He denies any symptoms of hypoglycemia.    -HTN:  Current management with amlodipine, aldactone, losartan, coreg and hctz.  BP has been well controlled at home.  He is followed by Dr. [name] for history of CAD.  He denies anginal symptoms, headache, or vision changes.  -HLD:  He is doing well with atorvastatin.  He denies myalgias.   Lab Results  Component Value Date   LDLCALC 80 10/23/2018    Allergies  Allergen Reactions  . Shellfish Allergy Rash    Past Medical History:  Diagnosis Date  . Arthritis   . Asthma    no inhaler  . Coronary artery disease    cardiologist-  dr spruill; last visit 3 mos ago per pt  . GERD (gastroesophageal reflux disease)   . History of MI (myocardial infarction)    1985  . Hydronephrosis, left   . Hypertension   . Myocardial infarction ([place])   . Nephrolithiasis    left  . 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 Presence of tooth-root and mandibular implants    lower dental implants  . Prostate cancer ([place])   . Shortness of breath    WITH EXERTION  . Type 2 diabetes mellitus ([place])     Past Surgical History:  Procedure Laterality Date  . BUNIONECTOMY    . CYSTOSCOPY W/ RETROGRADES Left 03/14/2014   Procedure: CYSTOSCOPY WITH LEFT RETROGRADE PYELOGRAM, Left Ureteroscopy, Lweft ureteral Stent No string;  Surgeon: [name], MD;  Location: [facility];  Service: Urology;  Laterality: Left;  . HERNIA REPAIR    . PROSTATE BIOPSY    . TOTAL HIP  ARTHROPLASTY Left 03-20-2009  . TOTAL HIP ARTHROPLASTY  04/09/2012   Procedure: TOTAL HIP ARTHROPLASTY;  Surgeon: [name], MD;  Location: [facility];  Service: Orthopedics;  Laterality: Right;  . TOTAL KNEE ARTHROPLASTY Left 05-16-2008    Social History   Socioeconomic History  . Marital status: Widowed    Spouse name: Not on file  . Number of children: Not on file  . Years of education: Not on file  . Highest education level: Not on file  Occupational History  . Not on file  Tobacco Use  . Smoking status: Former Smoker    Packs/day: 1.00    Years: 25.00    Pack years: 25.00    Types: Cigarettes    Quit date: 04/11/1984    Years since quitting: 35.3  . Smokeless tobacco: Former Systems developer    Quit date: 04/02/1985  Substance and Sexual Activity  . Alcohol use: Yes    Comment: RARE  . Drug use: No  . Sexual activity: Not Currently  Other Topics Concern  . Not on file  Social History Narrative  . Not on file   Social Determinants of Health   Financial Resource Strain: Low Risk   . Difficulty of Paying Living Expenses: Not hard at all  Food Insecurity: No Food  Insecurity  . Worried About Charity fundraiser in the Last Year: Never true  . Ran Out of Food in the Last Year: Never true  Transportation Needs: No Transportation Needs  . Lack of Transportation (Medical): No  . Lack of Transportation (Non-Medical): No  Physical Activity:   . Days of Exercise per Week:   . Minutes of Exercise per Session:   Stress: Stress Concern Present  . Feeling of Stress : To some extent  Social Connections:   . Frequency of Communication with Friends and Family:   . Frequency of Social Gatherings with Friends and Family:   . Attends Religious Services:   . Active Member of Clubs or Organizations:   . Attends Archivist Meetings:   Marland Kitchen Marital Status:     Family History  Problem Relation Age of Onset  . Cancer Mother        breast  . Multiple sclerosis Daughter   . Cancer  Father        prostate  . Cancer Maternal Uncle        bone    Health Maintenance  Topic Date Due  . OPHTHALMOLOGY EXAM  Never done  . HEMOGLOBIN A1C  10/22/2019  . INFLUENZA VACCINE  11/10/2019  . FOOT EXAM  01/22/2020  . TETANUS/TDAP  04/10/2025  . COLONOSCOPY  11/15/2028  . Hepatitis C Screening  Completed  . PNA vac Low Risk Adult  Completed     ----------------------------------------------------------------------------------------------------------------------------------------------------------------------------------------------------------------- Physical Exam BP (!) 160/48   Pulse 76   Temp 98.6 F (37 C) (Oral)   Ht 6\' 1"  (1.854 m)   Wt 244 lb (110.7 kg)   BMI 32.19 kg/m   Physical Exam Constitutional:      Appearance: Normal appearance.  HENT:     Head: Normocephalic and atraumatic.  Cardiovascular:     Rate and Rhythm: Normal rate and regular rhythm.  Pulmonary:     Effort: Pulmonary effort is normal.     Breath sounds: Normal breath sounds.  Skin:    General: Skin is warm and dry.  Neurological:     General: No focal deficit present.     Mental Status: He is alert.  Psychiatric:        Mood and Affect: Mood normal.        Behavior: Behavior normal.     ------------------------------------------------------------------------------------------------------------------------------------------------------------------------------------------------------------------- Assessment and Plan  Essential hypertension Blood pressure is at goal at for age and co-morbidities.  I recommend that he continue current medications.  In addition they were instructed to follow a low sodium diet with regular exercise to help to maintain adequate control of blood pressure.    Uncontrolled type 2 diabetes mellitus with hyperglycemia, without long-term current use of insulin ([place]) Diabetes has been much better controlled recently.  Freestyle libre should help him monitor  glucose more closely as well.  Continue current medications.    Hyperlipidemia with target LDL less than 70 Tolerating atorvastatin well, continue at current strength.    No orders of the defined types were placed in this encounter.   Return in about 3 months (around 10/21/2019) for DM/HTN.    This visit occurred during the SARS-CoV-2 public health emergency.  Safety protocols were in place, including screening questions prior to the visit, additional usage of staff PPE, and extensive cleaning of exam room while observing appropriate contact time as indicated for disinfecting solutions.

## 2019-08-05 DIAGNOSIS — E785 Hyperlipidemia, unspecified: Secondary | ICD-10-CM | POA: Diagnosis not present

## 2019-08-05 DIAGNOSIS — I25118 Atherosclerotic heart disease of native coronary artery with other forms of angina pectoris: Secondary | ICD-10-CM | POA: Diagnosis not present

## 2019-08-05 DIAGNOSIS — E119 Type 2 diabetes mellitus without complications: Secondary | ICD-10-CM | POA: Diagnosis not present

## 2019-08-05 DIAGNOSIS — I10 Essential (primary) hypertension: Secondary | ICD-10-CM | POA: Diagnosis not present

## 2019-08-07 ENCOUNTER — Telehealth: Payer: Self-pay

## 2019-08-07 NOTE — Telephone Encounter (Signed)
Changed inhaler from Ventolin to ProAir due to insurance.

## 2019-08-09 ENCOUNTER — Other Ambulatory Visit: Payer: Self-pay

## 2019-08-09 ENCOUNTER — Telehealth: Payer: Self-pay

## 2019-08-09 DIAGNOSIS — J4520 Mild intermittent asthma, uncomplicated: Secondary | ICD-10-CM

## 2019-08-09 MED ORDER — ALBUTEROL SULFATE HFA 108 (90 BASE) MCG/ACT IN AERS: 2.0000 | INHALATION_SPRAY | Freq: Four times a day (QID) | RESPIRATORY_TRACT | 6 refills | Status: DC | PRN

## 2019-08-09 NOTE — Telephone Encounter (Signed)
Changed Rx for inhaler due to insurance.

## 2019-09-23 DIAGNOSIS — H524 Presbyopia: Secondary | ICD-10-CM | POA: Diagnosis not present

## 2019-09-23 DIAGNOSIS — E119 Type 2 diabetes mellitus without complications: Secondary | ICD-10-CM | POA: Diagnosis not present

## 2019-09-23 DIAGNOSIS — H52203 Unspecified astigmatism, bilateral: Secondary | ICD-10-CM | POA: Diagnosis not present

## 2019-09-23 DIAGNOSIS — Z961 Presence of intraocular lens: Secondary | ICD-10-CM | POA: Diagnosis not present

## 2019-09-23 DIAGNOSIS — H04123 Dry eye syndrome of bilateral lacrimal glands: Secondary | ICD-10-CM | POA: Diagnosis not present

## 2019-09-23 LAB — HM DIABETES EYE EXAM

## 2019-10-01 DIAGNOSIS — E785 Hyperlipidemia, unspecified: Secondary | ICD-10-CM | POA: Diagnosis not present

## 2019-10-01 DIAGNOSIS — I10 Essential (primary) hypertension: Secondary | ICD-10-CM | POA: Diagnosis not present

## 2019-10-01 DIAGNOSIS — E119 Type 2 diabetes mellitus without complications: Secondary | ICD-10-CM | POA: Diagnosis not present

## 2019-10-01 LAB — HEPATIC FUNCTION PANEL
ALT: 19 (ref 10–40)
AST: 12 — AB (ref 14–40)
Bilirubin, Direct: 0.2 (ref 0.01–0.4)
Bilirubin, Total: 0.4

## 2019-10-01 LAB — COMPREHENSIVE METABOLIC PANEL
Albumin: 4.1 (ref 3.5–5.0)
Calcium: 8.9 (ref 8.7–10.7)
GFR calc Af Amer: 54
GFR calc non Af Amer: 46
Globulin: 3.4

## 2019-10-01 LAB — BASIC METABOLIC PANEL
BUN: 27 — AB (ref 4–21)
CO2: 22 (ref 13–22)
Chloride: 109 — AB (ref 99–108)
Creatinine: 1.5 — AB (ref 0.6–1.3)
Glucose: 145
Potassium: 4.7 (ref 3.4–5.3)
Sodium: 138 (ref 137–147)

## 2019-10-01 LAB — LIPID PANEL
Cholesterol: 154 (ref 0–200)
HDL: 34 — AB (ref 35–70)
LDL Cholesterol: 99
Triglycerides: 116 (ref 40–160)

## 2019-10-01 LAB — HEMOGLOBIN A1C: Hemoglobin A1C: 7.2

## 2019-10-09 ENCOUNTER — Encounter: Payer: Self-pay | Admitting: Family Medicine

## 2019-10-21 ENCOUNTER — Ambulatory Visit (INDEPENDENT_AMBULATORY_CARE_PROVIDER_SITE_OTHER): Payer: HMO | Admitting: Family Medicine

## 2019-10-21 ENCOUNTER — Encounter: Payer: Self-pay | Admitting: Family Medicine

## 2019-10-21 VITALS — BP 138/57 | HR 70 | Ht 72.84 in | Wt 246.7 lb

## 2019-10-21 DIAGNOSIS — E785 Hyperlipidemia, unspecified: Secondary | ICD-10-CM

## 2019-10-21 DIAGNOSIS — I10 Essential (primary) hypertension: Secondary | ICD-10-CM

## 2019-10-21 DIAGNOSIS — E1165 Type 2 diabetes mellitus with hyperglycemia: Secondary | ICD-10-CM

## 2019-10-21 DIAGNOSIS — I739 Peripheral vascular disease, unspecified: Secondary | ICD-10-CM | POA: Diagnosis not present

## 2019-10-21 MED ORDER — GABAPENTIN 300 MG PO CAPS
300.0000 mg | ORAL_CAPSULE | Freq: Three times a day (TID) | ORAL | 3 refills | Status: DC
Start: 2019-10-21 — End: 2020-06-15

## 2019-10-21 NOTE — Assessment & Plan Note (Signed)
Previous ABI normal. I think likely neurogenic claudication. Will provide trial of gabapentin.  Consider MRI if symptoms persist.

## 2019-10-21 NOTE — Assessment & Plan Note (Signed)
Blood sugars fairly well controlled at home.  Having some difficulty with freestyle libre, recommend that he bring device in so that we can be sure he is applying correctly.   Update a1c today.

## 2019-10-21 NOTE — Progress Notes (Signed)
[name] - 74 y.o. male MRN [id number]  Date of birth: 09-01-1945  Subjective Chief Complaint  Patient presents with  . Hypertension  . Diabetes    HPI [name] is a 74 y.o. male here today for follow up of HTN and T2DM.    -HTN:  Current management with amlodipine, losartan, hctz, coreg and aldactone.  He reports that he is compliant with current medications.  He denies chest pain, shortness of breath, palpitations, headache or vision changes.   -T2DM:  Managed with [place] and amaryl.  He has maintained pretty good control with this.  He denies episodes of hypoglycemia.  He does have libre CGM but has had problems with getting it to stay attached.  He thinks he is applying correctly.    -Leg pain:  Continues to have bilateral leg pain.  Worse when walking longer distances.  Better with rest.  ABI's completed previously were normal.  He does have som chronic low back pain.  He denies numbness/tingling.    ROS:  A comprehensive ROS was completed and negative except as noted per HPI  Allergies  Allergen Reactions  . Shellfish Allergy Rash    Past Medical History:  Diagnosis Date  . Arthritis   . Asthma    no inhaler  . Coronary artery disease    cardiologist-  dr spruill; last visit 3 mos ago per pt  . GERD (gastroesophageal reflux disease)   . History of MI (myocardial infarction)    1985  . Hydronephrosis, left   . Hypertension   . Myocardial infarction ([place])   . Nephrolithiasis    left  . 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 Presence of tooth-root and mandibular implants    lower dental implants  . Prostate cancer ([place])   . Shortness of breath    WITH EXERTION  . Type 2 diabetes mellitus ([place])     Past Surgical History:  Procedure Laterality Date  . BUNIONECTOMY    . CYSTOSCOPY W/ RETROGRADES Left 03/14/2014   Procedure: CYSTOSCOPY WITH LEFT RETROGRADE PYELOGRAM, Left Ureteroscopy, Lweft ureteral Stent No string;  Surgeon:  [name], MD;  Location: [facility];  Service: Urology;  Laterality: Left;  . HERNIA REPAIR    . PROSTATE BIOPSY    . TOTAL HIP ARTHROPLASTY Left 03-20-2009  . TOTAL HIP ARTHROPLASTY  04/09/2012   Procedure: TOTAL HIP ARTHROPLASTY;  Surgeon: [name], MD;  Location: [facility];  Service: Orthopedics;  Laterality: Right;  . TOTAL KNEE ARTHROPLASTY Left 05-16-2008    Social History   Socioeconomic History  . Marital status: Widowed    Spouse name: Not on file  . Number of children: Not on file  . Years of education: Not on file  . Highest education level: Not on file  Occupational History  . Not on file  Tobacco Use  . Smoking status: Former Smoker    Packs/day: 1.00    Years: 25.00    Pack years: 25.00    Types: Cigarettes    Quit date: 04/11/1984    Years since quitting: 35.5  . Smokeless tobacco: Former Systems developer    Quit date: 04/02/1985  Vaping Use  . Vaping Use: Never used  Substance and Sexual Activity  . Alcohol use: Yes    Comment: RARE  . Drug use: No  . Sexual activity: Not Currently  Other Topics Concern  . Not on file  Social History Narrative  .  Not on file   Social Determinants of Health   Financial Resource Strain: Low Risk   . Difficulty of Paying Living Expenses: Not hard at all  Food Insecurity: No Food Insecurity  . Worried About Charity fundraiser in the Last Year: Never true  . Ran Out of Food in the Last Year: Never true  Transportation Needs: No Transportation Needs  . Lack of Transportation (Medical): No  . Lack of Transportation (Non-Medical): No  Physical Activity:   . Days of Exercise per Week:   . Minutes of Exercise per Session:   Stress: Stress Concern Present  . Feeling of Stress : To some extent  Social Connections:   . Frequency of Communication with Friends and Family:   . Frequency of Social Gatherings with Friends and Family:   . Attends Religious Services:   . Active Member of Clubs or Organizations:   .  Attends Archivist Meetings:   Marland Kitchen Marital Status:     Family History  Problem Relation Age of Onset  . Cancer Mother        breast  . Multiple sclerosis Daughter   . Cancer Father        prostate  . Cancer Maternal Uncle        bone    Health Maintenance  Topic Date Due  . COVID-19 Vaccine (1) Never done  . HEMOGLOBIN A1C  10/22/2019  . INFLUENZA VACCINE  11/10/2019  . FOOT EXAM  01/22/2020  . OPHTHALMOLOGY EXAM  09/22/2020  . TETANUS/TDAP  04/10/2025  . COLONOSCOPY  11/15/2028  . Hepatitis C Screening  Completed  . PNA vac Low Risk Adult  Completed     ----------------------------------------------------------------------------------------------------------------------------------------------------------------------------------------------------------------- Physical Exam BP (!) 138/57 (BP Location: Left Arm, Patient Position: Sitting, Cuff Size: Large)   Pulse 70   Ht 6' 0.84" (1.85 m)   Wt 246 lb 11.2 oz (111.9 kg)   SpO2 96%   BMI 32.70 kg/m   Physical Exam Constitutional:      Appearance: Normal appearance.  Eyes:     General: No scleral icterus. Cardiovascular:     Rate and Rhythm: Normal rate and regular rhythm.  Pulmonary:     Effort: Pulmonary effort is normal.     Breath sounds: Normal breath sounds.  Musculoskeletal:     Cervical back: Neck supple.  Skin:    General: Skin is warm and dry.  Neurological:     General: No focal deficit present.     Mental Status: He is alert.  Psychiatric:        Mood and Affect: Mood normal.        Behavior: Behavior normal.     ------------------------------------------------------------------------------------------------------------------------------------------------------------------------------------------------------------------- Assessment and Plan  Intermittent claudication (HCC) Previous ABI normal. I think likely neurogenic claudication. Will provide trial of gabapentin.  Consider MRI if  symptoms persist.   Uncontrolled type 2 diabetes mellitus with hyperglycemia, without long-term current use of insulin (HCC) Blood sugars fairly well controlled at home.  Having some difficulty with freestyle libre, recommend that he bring device in so that we can be sure he is applying correctly.   Update a1c today.   Essential hypertension Blood pressure is at goal at for age and co-morbidities.  I recommend he continue current medications.  In addition they were instructed to follow a low sodium diet with regular exercise to help to maintain adequate control of blood pressure.     Meds ordered this encounter  Medications  . gabapentin (NEURONTIN)  300 MG capsule    Sig: Take 1 capsule (300 mg total) by mouth 3 (three) times daily. Start 300mg  at bedtime x1 week then may increase to three times per day.    Dispense:  90 capsule    Refill:  3    Return in about 3 months (around 01/21/2020) for HTN/DM.    This visit occurred during the SARS-CoV-2 public health emergency.  Safety protocols were in place, including screening questions prior to the visit, additional usage of staff PPE, and extensive cleaning of exam room while observing appropriate contact time as indicated for disinfecting solutions.

## 2019-10-21 NOTE — Patient Instructions (Signed)
Nice to see you today! Have labs completed.  Let's try gabapentin for pain in legs.  See me again in 3 months.

## 2019-10-21 NOTE — Assessment & Plan Note (Signed)
Blood pressure is at goal at for age and co-morbidities.  I recommend he continue current medications.  In addition they were instructed to follow a low sodium diet with regular exercise to help to maintain adequate control of blood pressure.   

## 2019-10-24 ENCOUNTER — Telehealth: Payer: Self-pay

## 2019-10-24 DIAGNOSIS — E785 Hyperlipidemia, unspecified: Secondary | ICD-10-CM | POA: Diagnosis not present

## 2019-10-24 DIAGNOSIS — E1165 Type 2 diabetes mellitus with hyperglycemia: Secondary | ICD-10-CM | POA: Diagnosis not present

## 2019-10-24 NOTE — Telephone Encounter (Signed)
Received phone call from [facility] stating [name] had Hgb A1C labs drawn 3 weeks prior by Dr. [name].   [facility] has contacted the office of Dr. [name] and they are to fax results to Dr. Zigmund Daniel.

## 2019-10-25 LAB — COMPLETE METABOLIC PANEL WITH GFR
AG Ratio: 1.2 (calc) (ref 1.0–2.5)
ALT: 14 U/L (ref 9–46)
AST: 10 U/L (ref 10–35)
Albumin: 4.1 g/dL (ref 3.6–5.1)
Alkaline phosphatase (APISO): 87 U/L (ref 35–144)
BUN/Creatinine Ratio: 20 (calc) (ref 6–22)
BUN: 33 mg/dL — ABNORMAL HIGH (ref 7–25)
CO2: 18 mmol/L — ABNORMAL LOW (ref 20–32)
Calcium: 9 mg/dL (ref 8.6–10.3)
Chloride: 109 mmol/L (ref 98–110)
Creat: 1.62 mg/dL — ABNORMAL HIGH (ref 0.70–1.18)
GFR, Est African American: 48 mL/min/{1.73_m2} — ABNORMAL LOW (ref >=60)
GFR, Est Non African American: 41 mL/min/{1.73_m2} — ABNORMAL LOW (ref >=60)
Globulin: 3.3 g/dL (calc) (ref 1.9–3.7)
Glucose, Bld: 145 mg/dL — ABNORMAL HIGH (ref 65–99)
Potassium: 4.8 mmol/L (ref 3.5–5.3)
Sodium: 135 mmol/L (ref 135–146)
Total Bilirubin: 0.2 mg/dL (ref 0.2–1.2)
Total Protein: 7.4 g/dL (ref 6.1–8.1)

## 2019-10-25 LAB — LDL CHOLESTEROL, DIRECT: Direct LDL: 132 mg/dL — ABNORMAL HIGH (ref <100)

## 2019-10-30 ENCOUNTER — Encounter: Payer: Self-pay | Admitting: Family Medicine

## 2019-11-11 DIAGNOSIS — I25118 Atherosclerotic heart disease of native coronary artery with other forms of angina pectoris: Secondary | ICD-10-CM | POA: Diagnosis not present

## 2019-11-11 DIAGNOSIS — I10 Essential (primary) hypertension: Secondary | ICD-10-CM | POA: Diagnosis not present

## 2019-11-11 DIAGNOSIS — E785 Hyperlipidemia, unspecified: Secondary | ICD-10-CM | POA: Diagnosis not present

## 2019-11-11 DIAGNOSIS — J45909 Unspecified asthma, uncomplicated: Secondary | ICD-10-CM | POA: Diagnosis not present

## 2019-12-05 ENCOUNTER — Other Ambulatory Visit: Payer: Self-pay

## 2019-12-05 ENCOUNTER — Ambulatory Visit (INDEPENDENT_AMBULATORY_CARE_PROVIDER_SITE_OTHER): Payer: HMO

## 2019-12-05 ENCOUNTER — Encounter: Payer: Self-pay | Admitting: Medical-Surgical

## 2019-12-05 ENCOUNTER — Ambulatory Visit (INDEPENDENT_AMBULATORY_CARE_PROVIDER_SITE_OTHER): Payer: HMO | Admitting: Medical-Surgical

## 2019-12-05 VITALS — BP 117/73 | HR 67 | Temp 98.1°F | Ht 73.0 in | Wt 241.9 lb

## 2019-12-05 DIAGNOSIS — R109 Unspecified abdominal pain: Secondary | ICD-10-CM | POA: Diagnosis not present

## 2019-12-05 DIAGNOSIS — R1031 Right lower quadrant pain: Secondary | ICD-10-CM

## 2019-12-05 DIAGNOSIS — N1832 Chronic kidney disease, stage 3b: Secondary | ICD-10-CM

## 2019-12-05 NOTE — Patient Outreach (Signed)
  [facility]) Care Management Chronic Special Needs Program    12/05/2019  Name: [name], DOB: 09/04/1945  MRN: [id number]   [name] is enrolled in a chronic special needs plan. RNCM called to follow up and update care plan. Client answered, two client identifiers confirmed. Client states this is not a good time to talk and request return call next week.  Plan: RNCM rescheduled call for next week.  Thea Silversmith, RN, MSN, [place] [phone]

## 2019-12-05 NOTE — Progress Notes (Signed)
Subjective:    CC: abdominal pain  HPI: Pleasant 74 year old male presenting today with complaints of abdominal pain.  Reports his daughter brought him [employer] approximately 6 days ago.  He ate the hamburger without a problem.  Later on that day he ate part of the chili, and finished the rest approximately 1 hour later.  He woke the next morning with nausea, vomiting, and diarrhea accompanied by abdominal cramping.  The next day he awoke feeling hot as if he had a fever with a continuation of symptoms.  Today, he reports the nausea, vomiting, and diarrhea have resolved but he is left with a residual right lower quadrant abdominal pain radiating to the right flank as well as some gassiness.  He has also noticed some dizziness, most notable first thing in the morning when getting out of bed.  Dizziness lasts for approximately 2 minutes.  He also had one dizzy spell after working outside for several hours in the heat and then sitting down at the table.  Admits to drinking quite a bit of ginger beer lately as it is his favorite but when the right lower quadrant pain started, he began increasing his water and decreasing the ginger beer intake.  Stools have returned to normal, no melena or hematochezia.  No further fever or chills.  Tried taking alliance OTC which helps some with bloating but has not helped his abdominal pain.  Describes the pain as throbbing, mild, not interfering with regular activities.  Has found that drinking more water tends to help resolve the pain.  Expresses concern regarding his kidney function today.  I reviewed the past medical history, family history, social history, surgical history, and allergies today and no changes were needed.  Please see the problem list section below in epic for further details.  Past Medical History: Past Medical History:  Diagnosis Date  . Arthritis   . Asthma    no inhaler  . Coronary artery disease    cardiologist-  dr spruill; last visit 3 mos  ago per pt  . GERD (gastroesophageal reflux disease)   . History of MI (myocardial infarction)    1985  . Hydronephrosis, left   . Hypertension   . Myocardial infarction ([place])   . Nephrolithiasis    left  . 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 Presence of tooth-root and mandibular implants    lower dental implants  . Prostate cancer ([place])   . Shortness of breath    WITH EXERTION  . Type 2 diabetes mellitus ([place])    Past Surgical History: Past Surgical History:  Procedure Laterality Date  . BUNIONECTOMY    . CYSTOSCOPY W/ RETROGRADES Left 03/14/2014   Procedure: CYSTOSCOPY WITH LEFT RETROGRADE PYELOGRAM, Left Ureteroscopy, Lweft ureteral Stent No string;  Surgeon: [name], MD;  Location[facility];  Service: Urology;  Laterality: Left;  . HERNIA REPAIR    . PROSTATE BIOPSY    . TOTAL HIP ARTHROPLASTY Left 03-20-2009  . TOTAL HIP ARTHROPLASTY  04/09/2012   Procedure: TOTAL HIP ARTHROPLASTY;  Surgeon: [name], MD;  Location: [facility];  Service: Orthopedics;  Laterality: Right;  . TOTAL KNEE ARTHROPLASTY Left 05-16-2008   Social History: Social History   Socioeconomic History  . Marital status: Widowed    Spouse name: Not on file  . Number of children: Not on file  . Years of education: Not on file  . Highest education level: Not on file  Occupational History  . Not on file  Tobacco Use  . Smoking status: Former Smoker    Packs/day: 1.00    Years: 25.00    Pack years: 25.00    Types: Cigarettes    Quit date: 04/11/1984    Years since quitting: 35.6  . Smokeless tobacco: Former Systems developer    Quit date: 04/02/1985  Vaping Use  . Vaping Use: Never used  Substance and Sexual Activity  . Alcohol use: Yes    Comment: RARE  . Drug use: No  . Sexual activity: Not Currently  Other Topics Concern  . Not on file  Social History Narrative  . Not on file   Social Determinants of Health   Financial Resource Strain:   . Difficulty  of Paying Living Expenses: Not on file  Food Insecurity: No Food Insecurity  . Worried About Charity fundraiser in the Last Year: Never true  . Ran Out of Food in the Last Year: Never true  Transportation Needs: No Transportation Needs  . Lack of Transportation (Medical): No  . Lack of Transportation (Non-Medical): No  Physical Activity:   . Days of Exercise per Week: Not on file  . Minutes of Exercise per Session: Not on file  Stress:   . Feeling of Stress : Not on file  Social Connections:   . Frequency of Communication with Friends and Family: Not on file  . Frequency of Social Gatherings with Friends and Family: Not on file  . Attends Religious Services: Not on file  . Active Member of Clubs or Organizations: Not on file  . Attends Archivist Meetings: Not on file  . Marital Status: Not on file   Family History: Family History  Problem Relation Age of Onset  . Cancer Mother        breast  . Multiple sclerosis Daughter   . Cancer Father        prostate  . Cancer Maternal Uncle        bone   Allergies: Allergies  Allergen Reactions  . Shellfish Allergy Rash   Medications: See med rec.  Review of Systems: No fevers, chills, night sweats, weight loss, chest pain, or shortness of breath.   Objective:    General: Well Developed, well nourished, and in no acute distress.  Neuro: Alert and oriented x3.  HEENT: Normocephalic, atraumatic.  Skin: Warm and dry. Cardiac: Regular rate and rhythm, no murmurs rubs or gallops, no lower extremity edema.  Respiratory: Clear to auscultation bilaterally. Not using accessory muscles, speaking in full sentences. Abdomen: Soft, rounded, nondistended, nontender.  Unable to reproduce pain/discomfort in office.  Bowel sounds mildly hyperactive x4 quadrants.  No HSM appreciated.    Impression and Recommendations:    1. Right lower quadrant abdominal pain/flank discomfort Rechecking CBC with differential and CMP after severe  nausea/vomiting/diarrhea for couple of days.  No known history of diverticulitis/diverticulosis but low suspicion at this time.  We will go ahead and get a KUB to make sure there are no overt abnormalities.  Advised patient to increase rest, focus on drinking no less than 64 ounces of water per day, limit ginger beer intake.  Suspect dizziness first thing in the morning may be related to dehydration in conjunction with very quick rising from lying to standing.  Advised patient to rise slowly from bed to allow his body to adjust to the position changes. - CBC w/Diff/Platelet - COMPLETE METABOLIC PANEL WITH GFR - DG Abd 1 View; Future  Return if symptoms worsen or fail to improve. ___________________________________________ Clearnce Sorrel, DNP, APRN, FNP-BC [facility] and [place]

## 2019-12-06 DIAGNOSIS — R1031 Right lower quadrant pain: Secondary | ICD-10-CM | POA: Diagnosis not present

## 2019-12-06 DIAGNOSIS — R109 Unspecified abdominal pain: Secondary | ICD-10-CM | POA: Diagnosis not present

## 2019-12-07 LAB — COMPLETE METABOLIC PANEL WITH GFR
AG Ratio: 1.3 (calc) (ref 1.0–2.5)
ALT: 9 U/L (ref 9–46)
AST: 9 U/L — ABNORMAL LOW (ref 10–35)
Albumin: 4.4 g/dL (ref 3.6–5.1)
Alkaline phosphatase (APISO): 82 U/L (ref 35–144)
BUN/Creatinine Ratio: 21 (calc) (ref 6–22)
BUN: 43 mg/dL — ABNORMAL HIGH (ref 7–25)
CO2: 21 mmol/L (ref 20–32)
Calcium: 9 mg/dL (ref 8.6–10.3)
Chloride: 109 mmol/L (ref 98–110)
Creat: 2.02 mg/dL — ABNORMAL HIGH (ref 0.70–1.18)
GFR, Est African American: 37 mL/min/{1.73_m2} — ABNORMAL LOW (ref >=60)
GFR, Est Non African American: 32 mL/min/{1.73_m2} — ABNORMAL LOW (ref >=60)
Globulin: 3.5 g/dL (calc) (ref 1.9–3.7)
Glucose, Bld: 152 mg/dL — ABNORMAL HIGH (ref 65–99)
Potassium: 5.4 mmol/L — ABNORMAL HIGH (ref 3.5–5.3)
Sodium: 137 mmol/L (ref 135–146)
Total Bilirubin: 0.3 mg/dL (ref 0.2–1.2)
Total Protein: 7.9 g/dL (ref 6.1–8.1)

## 2019-12-07 LAB — CBC WITH DIFFERENTIAL/PLATELET
Absolute Monocytes: 576 cells/uL (ref 200–950)
Basophils Absolute: 32 cells/uL (ref 0–200)
Basophils Relative: 0.7 %
Eosinophils Absolute: 297 cells/uL (ref 15–500)
Eosinophils Relative: 6.6 %
HCT: 31.9 % — ABNORMAL LOW (ref 38.5–50.0)
Hemoglobin: 10.7 g/dL — ABNORMAL LOW (ref 13.2–17.1)
Lymphs Abs: 972 cells/uL (ref 850–3900)
MCH: 31.7 pg (ref 27.0–33.0)
MCHC: 33.5 g/dL (ref 32.0–36.0)
MCV: 94.4 fL (ref 80.0–100.0)
MPV: 9.3 fL (ref 7.5–12.5)
Monocytes Relative: 12.8 %
Neutro Abs: 2624 cells/uL (ref 1500–7800)
Neutrophils Relative %: 58.3 %
Platelets: 350 10*3/uL (ref 140–400)
RBC: 3.38 10*6/uL — ABNORMAL LOW (ref 4.20–5.80)
RDW: 13.4 % (ref 11.0–15.0)
Total Lymphocyte: 21.6 %
WBC: 4.5 10*3/uL (ref 3.8–10.8)

## 2019-12-09 NOTE — Addendum Note (Signed)
Addended bySamuel Bouche on: 12/09/2019 07:47 AM   Modules accepted: Orders

## 2019-12-10 ENCOUNTER — Other Ambulatory Visit: Payer: Self-pay

## 2019-12-10 NOTE — Patient Outreach (Signed)
  [facility]) Care Management Chronic Special Needs Program    12/10/2019  Name: [name], DOB: 28-Feb-1946  MRN: [id number]   [name] is enrolled in a chronic special needs plan for Diabetes. RNCM called to follow up and review individualized care plan. No answer. HIPAA compliant message left. 2nd outreach attempt.  Plan: Chronic care management coordinator will attempt outreach within 1-2 weeks.  Thea Silversmith, RN, MSN, [place] [phone]

## 2019-12-10 NOTE — Patient Outreach (Signed)
  [facility]) Care Management Chronic Special Needs Program    12/10/2019  Name: [name], DOB: Jan 11, 1946  MRN: [id number]   Mr. Beacher Every is enrolled in a chronic special needs plan. RNCM returned call to client. No answer. HIPPA complaint message left.  Plan: await return call and attempt outreach within the next 1-2 weeks.  Thea Silversmith, RN, MSN, [place] [phone]

## 2019-12-12 ENCOUNTER — Other Ambulatory Visit: Payer: Self-pay

## 2019-12-12 NOTE — Patient Outreach (Signed)
[facility]) Care Management Chronic Special Needs Program  12/12/2019  Name: [name] DOB: February 17, 1946  MRN: [id number]  [name] is enrolled in a chronic special needs plan for.  RNCM called to follow up, assess and review updated care plan.  Subjective: Client reports he is doing well. Client states he now has a glucose meter, Freestyle libre. Client denies any questions regarding use of meter. Client reports blood sugar this morning was 135. Last A1C 7.2 on 10/01/19. Client states he attends provider visits as scheduled and reports he has all prescribed medications and takes medicines as prescribed. Last office visit with provider on 12/05/19. Client states, although he has slowed down on his outside yard work, he remains active.   Goals Addressed            This Visit's Progress   . COMPLETED:  Acknowledge receipt of Advanced Directive package       Request another copy    . COMPLETED: Client verbalizes knowledge of Heart Attack self management skills within the next 6 months.       Client reports he continues to take medications as prescribed, attend provider visits and remains active. Client is without signs/symptoms.       . COMPLETED: Client will report no worsening of symptoms related to heart disease within the next 6 months.       Client reports no issues or concerns.    . COMPLETED: Client will verbalize knowledge of self management of Hypertension as evidences by BP reading of 140/90 or less; or as defined by provider       12/10/19 Blood pressure 117/73; 10/21/19 Blood Pressure 138/57; 07/22/19 Blood Pressure 138/78.   Continue to: Follow up with your doctor as scheduled. Continue to take your medications as prescribed. Take your blood pressure periodically and call your doctor with any questions or concerns.      . COMPLETED: Maintain timely refills of diabetic medication as prescribed within the year .       Client denies any  difficulty obtaining medications. It is important to notify your doctor and/or your RN nurse care coordinator if you are not able to get your medications.    . COMPLETED: Obtain annual  Lipid Profile, LDL-C       Done 10/24/19    . COMPLETED: Obtain Annual Eye (retinal)  Exam        Completed 09/23/2019.    . Obtain Annual Foot Exam   On track    Last noted documentation 01/22/2019  Diabetes can affect the nerves inyour feet, causing decreased feeling or numbness. Emmi education provided, "Foot care for diabetics". Please review and call if you have any questions.     . Obtain annual screen for micro albuminuria (urine) , nephropathy (kidney problems)   On track    Per chart, last noted done on 10/23/2018. Diabetes can affect your kidneys. This test shows how your kidneys are working. Emmi education provided, "urine albumin test". Please review and call if you have any questions.    . COMPLETED: Obtain Hemoglobin A1C at least 2 times per year       Done 10/01/19 A1C 7.2 and 04/24/19 A1C 7.0.    . COMPLETED: Patient Stated: obtain glucose meter and begin checking blood sugar within the next 3 months.       Reports he has received Freestyle libre glucometer and is using meter. Client denies any questions concerning meter at this time.      Marland Kitchen  COMPLETED: Visit Primary Care Provider or Endocrinologist at least 2 times per year        Provider seen 12/05/19; 10/21/19, 07/22/19 and 04/24/19.      Plan: Client with a question involving a claim-warm transfer to health care concierge completed. Updated care plan send to client; updated care plan send to primary care provider. Education provided. Next outreach per tier level within 6 months.     Thea Silversmith, RN, MSN, [place] [phone]

## 2019-12-20 DIAGNOSIS — E1165 Type 2 diabetes mellitus with hyperglycemia: Secondary | ICD-10-CM | POA: Diagnosis not present

## 2019-12-20 DIAGNOSIS — E785 Hyperlipidemia, unspecified: Secondary | ICD-10-CM | POA: Diagnosis not present

## 2019-12-21 LAB — HEMOGLOBIN A1C
Hgb A1c MFr Bld: 7.1 % of total Hgb — ABNORMAL HIGH (ref <5.7)
Mean Plasma Glucose: 157 (calc)
eAG (mmol/L): 8.7 (calc)

## 2020-01-02 ENCOUNTER — Other Ambulatory Visit: Payer: Self-pay

## 2020-01-02 DIAGNOSIS — N1832 Chronic kidney disease, stage 3b: Secondary | ICD-10-CM

## 2020-01-06 DIAGNOSIS — N1832 Chronic kidney disease, stage 3b: Secondary | ICD-10-CM | POA: Diagnosis not present

## 2020-01-06 LAB — BASIC METABOLIC PANEL WITH GFR
BUN/Creatinine Ratio: 18 (calc) (ref 6–22)
BUN: 55 mg/dL — ABNORMAL HIGH (ref 7–25)
CO2: 20 mmol/L (ref 20–32)
Calcium: 9 mg/dL (ref 8.6–10.3)
Chloride: 108 mmol/L (ref 98–110)
Creat: 3.11 mg/dL — ABNORMAL HIGH (ref 0.70–1.18)
GFR, Est African American: 22 mL/min/{1.73_m2} — ABNORMAL LOW (ref >=60)
GFR, Est Non African American: 19 mL/min/{1.73_m2} — ABNORMAL LOW (ref >=60)
Glucose, Bld: 107 mg/dL — ABNORMAL HIGH (ref 65–99)
Potassium: 5.3 mmol/L (ref 3.5–5.3)
Sodium: 136 mmol/L (ref 135–146)

## 2020-01-07 ENCOUNTER — Other Ambulatory Visit: Payer: Self-pay | Admitting: Medical-Surgical

## 2020-01-07 DIAGNOSIS — N1832 Chronic kidney disease, stage 3b: Secondary | ICD-10-CM

## 2020-01-07 DIAGNOSIS — R944 Abnormal results of kidney function studies: Secondary | ICD-10-CM

## 2020-01-07 DIAGNOSIS — C61 Malignant neoplasm of prostate: Secondary | ICD-10-CM

## 2020-01-07 DIAGNOSIS — R7989 Other specified abnormal findings of blood chemistry: Secondary | ICD-10-CM

## 2020-01-13 DIAGNOSIS — R944 Abnormal results of kidney function studies: Secondary | ICD-10-CM | POA: Diagnosis not present

## 2020-01-13 DIAGNOSIS — R7989 Other specified abnormal findings of blood chemistry: Secondary | ICD-10-CM | POA: Diagnosis not present

## 2020-01-13 DIAGNOSIS — N1832 Chronic kidney disease, stage 3b: Secondary | ICD-10-CM | POA: Diagnosis not present

## 2020-01-14 ENCOUNTER — Inpatient Hospital Stay (HOSPITAL_COMMUNITY)
Admission: EM | Admit: 2020-01-14 | Discharge: 2020-01-17 | DRG: 660 | Disposition: A | Discharge status: Home / Self Care | Payer: HMO | Source: Ambulatory Visit | Attending: Internal Medicine | Admitting: Internal Medicine

## 2020-01-14 ENCOUNTER — Encounter (HOSPITAL_COMMUNITY): Payer: Self-pay

## 2020-01-14 ENCOUNTER — Inpatient Hospital Stay (HOSPITAL_COMMUNITY): Payer: HMO

## 2020-01-14 ENCOUNTER — Emergency Department (HOSPITAL_COMMUNITY): Payer: HMO

## 2020-01-14 ENCOUNTER — Other Ambulatory Visit: Payer: Self-pay

## 2020-01-14 DIAGNOSIS — I10 Essential (primary) hypertension: Secondary | ICD-10-CM | POA: Diagnosis present

## 2020-01-14 DIAGNOSIS — E785 Hyperlipidemia, unspecified: Secondary | ICD-10-CM | POA: Diagnosis present

## 2020-01-14 DIAGNOSIS — I252 Old myocardial infarction: Secondary | ICD-10-CM

## 2020-01-14 DIAGNOSIS — N132 Hydronephrosis with renal and ureteral calculous obstruction: Secondary | ICD-10-CM | POA: Diagnosis not present

## 2020-01-14 DIAGNOSIS — Z803 Family history of malignant neoplasm of breast: Secondary | ICD-10-CM

## 2020-01-14 DIAGNOSIS — M1990 Unspecified osteoarthritis, unspecified site: Secondary | ICD-10-CM | POA: Diagnosis not present

## 2020-01-14 DIAGNOSIS — E871 Hypo-osmolality and hyponatremia: Secondary | ICD-10-CM | POA: Diagnosis not present

## 2020-01-14 DIAGNOSIS — E1169 Type 2 diabetes mellitus with other specified complication: Secondary | ICD-10-CM | POA: Diagnosis present

## 2020-01-14 DIAGNOSIS — Z79899 Other long term (current) drug therapy: Secondary | ICD-10-CM

## 2020-01-14 DIAGNOSIS — Z96652 Presence of left artificial knee joint: Secondary | ICD-10-CM | POA: Diagnosis not present

## 2020-01-14 DIAGNOSIS — N179 Acute kidney failure, unspecified: Secondary | ICD-10-CM | POA: Diagnosis not present

## 2020-01-14 DIAGNOSIS — Z7982 Long term (current) use of aspirin: Secondary | ICD-10-CM

## 2020-01-14 DIAGNOSIS — Z87891 Personal history of nicotine dependence: Secondary | ICD-10-CM | POA: Diagnosis not present

## 2020-01-14 DIAGNOSIS — E1122 Type 2 diabetes mellitus with diabetic chronic kidney disease: Secondary | ICD-10-CM

## 2020-01-14 DIAGNOSIS — C61 Malignant neoplasm of prostate: Secondary | ICD-10-CM | POA: Diagnosis present

## 2020-01-14 DIAGNOSIS — Z96642 Presence of left artificial hip joint: Secondary | ICD-10-CM | POA: Diagnosis present

## 2020-01-14 DIAGNOSIS — N1330 Unspecified hydronephrosis: Secondary | ICD-10-CM | POA: Diagnosis not present

## 2020-01-14 DIAGNOSIS — Z808 Family history of malignant neoplasm of other organs or systems: Secondary | ICD-10-CM | POA: Diagnosis not present

## 2020-01-14 DIAGNOSIS — I2510 Atherosclerotic heart disease of native coronary artery without angina pectoris: Secondary | ICD-10-CM | POA: Diagnosis present

## 2020-01-14 DIAGNOSIS — I129 Hypertensive chronic kidney disease with stage 1 through stage 4 chronic kidney disease, or unspecified chronic kidney disease: Secondary | ICD-10-CM | POA: Diagnosis not present

## 2020-01-14 DIAGNOSIS — K5900 Constipation, unspecified: Secondary | ICD-10-CM | POA: Diagnosis present

## 2020-01-14 DIAGNOSIS — Z820 Family history of epilepsy and other diseases of the nervous system: Secondary | ICD-10-CM | POA: Diagnosis not present

## 2020-01-14 DIAGNOSIS — N1831 Chronic kidney disease, stage 3a: Secondary | ICD-10-CM | POA: Diagnosis not present

## 2020-01-14 DIAGNOSIS — E860 Dehydration: Secondary | ICD-10-CM | POA: Diagnosis present

## 2020-01-14 DIAGNOSIS — K219 Gastro-esophageal reflux disease without esophagitis: Secondary | ICD-10-CM | POA: Diagnosis not present

## 2020-01-14 DIAGNOSIS — Z923 Personal history of irradiation: Secondary | ICD-10-CM

## 2020-01-14 DIAGNOSIS — E875 Hyperkalemia: Secondary | ICD-10-CM | POA: Diagnosis not present

## 2020-01-14 DIAGNOSIS — E11649 Type 2 diabetes mellitus with hypoglycemia without coma: Secondary | ICD-10-CM | POA: Diagnosis present

## 2020-01-14 DIAGNOSIS — Z20822 Contact with and (suspected) exposure to covid-19: Secondary | ICD-10-CM | POA: Diagnosis not present

## 2020-01-14 DIAGNOSIS — D631 Anemia in chronic kidney disease: Secondary | ICD-10-CM | POA: Diagnosis present

## 2020-01-14 DIAGNOSIS — Z8546 Personal history of malignant neoplasm of prostate: Secondary | ICD-10-CM | POA: Diagnosis not present

## 2020-01-14 DIAGNOSIS — Z8042 Family history of malignant neoplasm of prostate: Secondary | ICD-10-CM

## 2020-01-14 DIAGNOSIS — Z7984 Long term (current) use of oral hypoglycemic drugs: Secondary | ICD-10-CM

## 2020-01-14 DIAGNOSIS — Z91013 Allergy to seafood: Secondary | ICD-10-CM | POA: Diagnosis not present

## 2020-01-14 DIAGNOSIS — I700 Atherosclerosis of aorta: Secondary | ICD-10-CM | POA: Diagnosis not present

## 2020-01-14 DIAGNOSIS — J45909 Unspecified asthma, uncomplicated: Secondary | ICD-10-CM | POA: Diagnosis present

## 2020-01-14 DIAGNOSIS — R202 Paresthesia of skin: Secondary | ICD-10-CM | POA: Diagnosis present

## 2020-01-14 LAB — COMPREHENSIVE METABOLIC PANEL
ALT: 12 U/L (ref 0–44)
AST: 10 U/L — ABNORMAL LOW (ref 15–41)
Albumin: 4 g/dL (ref 3.5–5.0)
Alkaline Phosphatase: 70 U/L (ref 38–126)
Anion gap: 12 (ref 5–15)
BUN: 55 mg/dL — ABNORMAL HIGH (ref 8–23)
CO2: 16 mmol/L — ABNORMAL LOW (ref 22–32)
Calcium: 8.8 mg/dL — ABNORMAL LOW (ref 8.9–10.3)
Chloride: 111 mmol/L (ref 98–111)
Creatinine, Ser: 3.2 mg/dL — ABNORMAL HIGH (ref 0.61–1.24)
GFR calc non Af Amer: 18 mL/min — ABNORMAL LOW (ref >60)
Glucose, Bld: 61 mg/dL — ABNORMAL LOW (ref 70–99)
Potassium: 6.3 mmol/L (ref 3.5–5.1)
Sodium: 139 mmol/L (ref 135–145)
Total Bilirubin: 0.6 mg/dL (ref 0.3–1.2)
Total Protein: 8.9 g/dL — ABNORMAL HIGH (ref 6.5–8.1)

## 2020-01-14 LAB — CBC
HCT: 26 % — ABNORMAL LOW (ref 39.0–52.0)
Hemoglobin: 8.7 g/dL — ABNORMAL LOW (ref 13.0–17.0)
MCH: 31.4 pg (ref 26.0–34.0)
MCHC: 33.5 g/dL (ref 30.0–36.0)
MCV: 93.9 fL (ref 80.0–100.0)
Platelets: 379 10*3/uL (ref 150–400)
RBC: 2.77 MIL/uL — ABNORMAL LOW (ref 4.22–5.81)
RDW: 12.9 % (ref 11.5–15.5)
WBC: 7.8 10*3/uL (ref 4.0–10.5)
nRBC: 0 % (ref 0.0–0.2)

## 2020-01-14 LAB — BASIC METABOLIC PANEL
Anion gap: 9 (ref 5–15)
BUN: 50 mg/dL — ABNORMAL HIGH (ref 8–23)
CO2: 13 mmol/L — ABNORMAL LOW (ref 22–32)
Calcium: 8.2 mg/dL — ABNORMAL LOW (ref 8.9–10.3)
Chloride: 110 mmol/L (ref 98–111)
Creatinine, Ser: 2.85 mg/dL — ABNORMAL HIGH (ref 0.61–1.24)
GFR calc non Af Amer: 21 mL/min — ABNORMAL LOW (ref >60)
Glucose, Bld: 171 mg/dL — ABNORMAL HIGH (ref 70–99)
Potassium: 5.5 mmol/L — ABNORMAL HIGH (ref 3.5–5.1)
Sodium: 132 mmol/L — ABNORMAL LOW (ref 135–145)

## 2020-01-14 LAB — BASIC METABOLIC PANEL WITH GFR
BUN/Creatinine Ratio: 16 (calc) (ref 6–22)
BUN: 52 mg/dL — ABNORMAL HIGH (ref 7–25)
CO2: 19 mmol/L — ABNORMAL LOW (ref 20–32)
Calcium: 8.9 mg/dL (ref 8.6–10.3)
Chloride: 110 mmol/L (ref 98–110)
Creat: 3.31 mg/dL — ABNORMAL HIGH (ref 0.70–1.18)
GFR, Est African American: 20 mL/min/{1.73_m2} — ABNORMAL LOW (ref >=60)
GFR, Est Non African American: 17 mL/min/{1.73_m2} — ABNORMAL LOW (ref >=60)
Glucose, Bld: 58 mg/dL — ABNORMAL LOW (ref 65–99)
Potassium: 6.4 mmol/L (ref 3.5–5.3)
Sodium: 136 mmol/L (ref 135–146)

## 2020-01-14 LAB — CBC WITH DIFFERENTIAL/PLATELET
Abs Immature Granulocytes: 0.03 10*3/uL (ref 0.00–0.07)
Basophils Absolute: 0 10*3/uL (ref 0.0–0.1)
Basophils Relative: 0 %
Eosinophils Absolute: 0.3 10*3/uL (ref 0.0–0.5)
Eosinophils Relative: 3 %
HCT: 28.6 % — ABNORMAL LOW (ref 39.0–52.0)
Hemoglobin: 9.6 g/dL — ABNORMAL LOW (ref 13.0–17.0)
Immature Granulocytes: 0 %
Lymphocytes Relative: 11 %
Lymphs Abs: 1 10*3/uL (ref 0.7–4.0)
MCH: 31.7 pg (ref 26.0–34.0)
MCHC: 33.6 g/dL (ref 30.0–36.0)
MCV: 94.4 fL (ref 80.0–100.0)
Monocytes Absolute: 0.7 10*3/uL (ref 0.1–1.0)
Monocytes Relative: 8 %
Neutro Abs: 7.3 10*3/uL (ref 1.7–7.7)
Neutrophils Relative %: 78 %
Platelets: 453 10*3/uL — ABNORMAL HIGH (ref 150–400)
RBC: 3.03 MIL/uL — ABNORMAL LOW (ref 4.22–5.81)
RDW: 13.1 % (ref 11.5–15.5)
WBC: 9.4 10*3/uL (ref 4.0–10.5)
nRBC: 0 % (ref 0.0–0.2)

## 2020-01-14 LAB — URINALYSIS, ROUTINE W REFLEX MICROSCOPIC
Bilirubin Urine: NEGATIVE
Glucose, UA: NEGATIVE mg/dL
Hgb urine dipstick: NEGATIVE
Ketones, ur: NEGATIVE mg/dL
Leukocytes,Ua: NEGATIVE
Nitrite: NEGATIVE
Protein, ur: NEGATIVE mg/dL
Specific Gravity, Urine: 1.013 (ref 1.005–1.030)
pH: 5 (ref 5.0–8.0)

## 2020-01-14 LAB — RESPIRATORY PANEL BY RT PCR (FLU A&B, COVID)
Influenza A by PCR: NEGATIVE
Influenza B by PCR: NEGATIVE
SARS Coronavirus 2 by RT PCR: NEGATIVE

## 2020-01-14 LAB — CBG MONITORING, ED
Glucose-Capillary: 52 mg/dL — ABNORMAL LOW (ref 70–99)
Glucose-Capillary: 162 mg/dL — ABNORMAL HIGH (ref 70–99)

## 2020-01-14 MED ORDER — DEXTROSE 50 % IV SOLN
1.0000 | Freq: Once | INTRAVENOUS | Status: AC
  Administered 2020-01-14: 50 mL via INTRAVENOUS
  Filled 2020-01-14: qty 50

## 2020-01-14 MED ORDER — SODIUM CHLORIDE 0.9% FLUSH
3.0000 mL | Freq: Two times a day (BID) | INTRAVENOUS | Status: DC
  Administered 2020-01-15 (×2): 3 mL via INTRAVENOUS

## 2020-01-14 MED ORDER — IOHEXOL 9 MG/ML PO SOLN
ORAL | Status: AC
  Administered 2020-01-14: 500 mL via ORAL
  Filled 2020-01-14: qty 1000

## 2020-01-14 MED ORDER — SODIUM CHLORIDE 0.9 % IV SOLN: INTRAVENOUS | Status: DC

## 2020-01-14 MED ORDER — ACETAMINOPHEN 325 MG PO TABS: 650.0000 mg | ORAL_TABLET | Freq: Four times a day (QID) | ORAL | Status: DC | PRN

## 2020-01-14 MED ORDER — IOHEXOL 9 MG/ML PO SOLN
500.0000 mL | ORAL | Status: AC
  Administered 2020-01-14: 500 mL via ORAL

## 2020-01-14 MED ORDER — AMLODIPINE BESYLATE 10 MG PO TABS
10.0000 mg | ORAL_TABLET | Freq: Every day | ORAL | Status: DC
  Administered 2020-01-14 – 2020-01-17 (×4): 10 mg via ORAL
  Filled 2020-01-14: qty 2
  Filled 2020-01-14: qty 1
  Filled 2020-01-14 (×2): qty 2
  Filled 2020-01-14: qty 1

## 2020-01-14 MED ORDER — ASPIRIN 81 MG PO CHEW
81.0000 mg | CHEWABLE_TABLET | Freq: Every day | ORAL | Status: DC
  Administered 2020-01-15 – 2020-01-17 (×3): 81 mg via ORAL
  Filled 2020-01-14 (×2): qty 1

## 2020-01-14 MED ORDER — HEPARIN SODIUM (PORCINE) 5000 UNIT/ML IJ SOLN
5000.0000 [IU] | Freq: Three times a day (TID) | INTRAMUSCULAR | Status: DC
  Administered 2020-01-14 – 2020-01-17 (×8): 5000 [IU] via SUBCUTANEOUS
  Filled 2020-01-14 (×8): qty 1

## 2020-01-14 MED ORDER — CALCIUM GLUCONATE-NACL 1-0.675 GM/50ML-% IV SOLN
1.0000 g | Freq: Once | INTRAVENOUS | Status: AC
  Administered 2020-01-14: 1000 mg via INTRAVENOUS
  Filled 2020-01-14: qty 50

## 2020-01-14 MED ORDER — POLYETHYLENE GLYCOL 3350 17 G PO PACK: 17.0000 g | PACK | Freq: Every day | ORAL | Status: DC | PRN

## 2020-01-14 MED ORDER — INSULIN ASPART 100 UNIT/ML ~~LOC~~ SOLN
0.0000 [IU] | Freq: Three times a day (TID) | SUBCUTANEOUS | Status: DC
  Administered 2020-01-15 – 2020-01-16 (×3): 1 [IU] via SUBCUTANEOUS
  Administered 2020-01-16: 2 [IU] via SUBCUTANEOUS
  Administered 2020-01-16 – 2020-01-17 (×3): 1 [IU] via SUBCUTANEOUS
  Filled 2020-01-14: qty 0.09

## 2020-01-14 MED ORDER — DOCUSATE SODIUM 100 MG PO CAPS: 100.0000 mg | ORAL_CAPSULE | Freq: Every day | ORAL | Status: DC | PRN

## 2020-01-14 MED ORDER — CARVEDILOL 25 MG PO TABS
25.0000 mg | ORAL_TABLET | Freq: Two times a day (BID) | ORAL | Status: DC
  Administered 2020-01-14 – 2020-01-17 (×6): 25 mg via ORAL
  Filled 2020-01-14: qty 1
  Filled 2020-01-14: qty 2
  Filled 2020-01-14 (×3): qty 1

## 2020-01-14 MED ORDER — HYDRALAZINE HCL 20 MG/ML IJ SOLN: 5.0000 mg | Freq: Four times a day (QID) | INTRAMUSCULAR | Status: DC | PRN

## 2020-01-14 MED ORDER — CARVEDILOL 12.5 MG PO TABS
25.0000 mg | ORAL_TABLET | Freq: Two times a day (BID) | ORAL | Status: DC
  Filled 2020-01-14: qty 2

## 2020-01-14 MED ORDER — SODIUM ZIRCONIUM CYCLOSILICATE 10 G PO PACK
10.0000 g | PACK | Freq: Three times a day (TID) | ORAL | Status: AC
  Administered 2020-01-14 – 2020-01-15 (×3): 10 g via ORAL
  Filled 2020-01-14 (×3): qty 1

## 2020-01-14 MED ORDER — INSULIN ASPART 100 UNIT/ML ~~LOC~~ SOLN
5.0000 [IU] | Freq: Once | SUBCUTANEOUS | Status: AC
  Administered 2020-01-14: 5 [IU] via INTRAVENOUS
  Filled 2020-01-14: qty 0.05

## 2020-01-14 MED ORDER — ACETAMINOPHEN 650 MG RE SUPP: 650.0000 mg | Freq: Four times a day (QID) | RECTAL | Status: DC | PRN

## 2020-01-14 MED ORDER — SODIUM ZIRCONIUM CYCLOSILICATE 10 G PO PACK
10.0000 g | PACK | Freq: Once | ORAL | Status: AC
  Administered 2020-01-14: 10 g via ORAL
  Filled 2020-01-14: qty 1

## 2020-01-14 MED ORDER — FERROUS SULFATE 325 (65 FE) MG PO TABS
325.0000 mg | ORAL_TABLET | Freq: Every day | ORAL | Status: DC
  Administered 2020-01-15 – 2020-01-17 (×3): 325 mg via ORAL
  Filled 2020-01-14 (×3): qty 1

## 2020-01-14 MED ORDER — HYDROCODONE-ACETAMINOPHEN 5-325 MG PO TABS: 1.0000 | ORAL_TABLET | Freq: Four times a day (QID) | ORAL | Status: DC | PRN

## 2020-01-14 MED ORDER — ATORVASTATIN CALCIUM 40 MG PO TABS
40.0000 mg | ORAL_TABLET | Freq: Every day | ORAL | Status: DC
  Administered 2020-01-14 – 2020-01-17 (×4): 40 mg via ORAL
  Filled 2020-01-14 (×4): qty 1

## 2020-01-14 MED ORDER — AMLODIPINE BESYLATE 5 MG PO TABS: 10.0000 mg | ORAL_TABLET | Freq: Every day | ORAL | Status: DC

## 2020-01-14 MED ORDER — HYDROMORPHONE HCL 1 MG/ML IJ SOLN: 0.5000 mg | INTRAMUSCULAR | Status: DC | PRN

## 2020-01-14 NOTE — ED Notes (Signed)
PA made aware of CBG.  Patient given OJ, sandwich, and crackers.

## 2020-01-14 NOTE — Consult Note (Addendum)
Renal Service Consult Note [facility]  [name] 01/14/2020 [place] Requesting Physician:  Dr Neysa Bonito, Lenna Sciara.   Reason for Consult:  Renal failure HPI: The patient is a 74 y.o. year-old w/ hx of DM2, prostate cancer, MI, HTN, CAD, hx left hydronephrosis. Pt presented to ED sent by his PCP for abnormal renal labs w/ K + 6.4, BUN 52 and creat 3.31.  EKG showed slight peaking of T"waves. He rec'd IV insulin, Ca gluc and lokelma. Abd CT showed severe L hydro and no hydro on the R.  Baseline creat was 1.3- 1.6. We are called for renal failure.   Pt seen in ED, denies hx of kidney problems/ kidney failure, heart failure or liver disease. +DM and HTN.  No voiding issues. +hx prostate cancer.    ROS  denies CP  no joint pain   no HA  no blurry vision  no rash  no diarrhea  no nausea/ vomiting  no dysuria  no difficulty voiding  no change in urine color    Past Medical History  Past Medical History:  Diagnosis Date   Arthritis    Asthma    no inhaler   Coronary artery disease    cardiologist-  dr spruill; last visit 3 mos ago per pt   GERD (gastroesophageal reflux disease)    History of MI (myocardial infarction)    1985   Hydronephrosis, left    Hypertension    Myocardial infarction ([place])    Nephrolithiasis    left   Neuromuscular disorder (HCC)    TINGLING IN BOTH HANDS   Presence of tooth-root and mandibular implants    lower dental implants   Prostate cancer ([place])    Shortness of breath    WITH EXERTION   Type 2 diabetes mellitus ([place])    Past Surgical History  Past Surgical History:  Procedure Laterality Date   BUNIONECTOMY     CYSTOSCOPY W/ RETROGRADES Left 03/14/2014   Procedure: CYSTOSCOPY WITH LEFT RETROGRADE PYELOGRAM, Left Ureteroscopy, Lweft ureteral Stent No string;  Surgeon: [name], MD;  Location: [facility];  Service: Urology;  Laterality: Left;   HERNIA REPAIR     PROSTATE BIOPSY      TOTAL HIP ARTHROPLASTY Left 03-20-2009   TOTAL HIP ARTHROPLASTY  04/09/2012   Procedure: TOTAL HIP ARTHROPLASTY;  Surgeon: [name], MD;  Location: [facility];  Service: Orthopedics;  Laterality: Right;   TOTAL KNEE ARTHROPLASTY Left 05-16-2008   Family History  Family History  Problem Relation Age of Onset   Cancer Mother        breast   Multiple sclerosis Daughter    Cancer Father        prostate   Cancer Maternal Uncle        bone   Social History  reports that he quit smoking about 35 years ago. His smoking use included cigarettes. He has a 25.00 pack-year smoking history. He quit smokeless tobacco use about 34 years ago. He reports current alcohol use. He reports that he does not use drugs. Allergies  Allergies  Allergen Reactions   Shellfish Allergy Rash   Home medications Prior to Admission medications   Medication Sig Start Date End Date Taking? Authorizing Provider  albuterol (VENTOLIN HFA) 108 (90 Base) MCG/ACT inhaler Inhale 2 puffs into the lungs every 6 (six) hours as needed for wheezing or shortness of breath. 08/09/19 01/14/20 Yes Luetta Nutting, DO  amLODipine (NORVASC) 10 MG tablet  Take 10 mg by mouth daily.   Yes [provider]  Aspirin 81 MG CAPS Take 81 mg by mouth daily.    Yes [provider]  atorvastatin (LIPITOR) 40 MG tablet Take 1 tablet (40 mg total) by mouth daily. 06/26/16  Yes Tat, Shanon Brow, MD  carvedilol (COREG) 25 MG tablet Take 25 mg by mouth 2 (two) times daily with a meal.   Yes [provider]  co-enzyme Q-10 30 MG capsule Take 30 mg by mouth daily.   Yes [provider]  cycloSPORINE (RESTASIS) 0.05 % ophthalmic emulsion Place 1 drop into both eyes 2 (two) times daily.  09/23/19  Yes [provider]  docusate sodium (COLACE) 100 MG capsule Take 100 mg by mouth daily as needed for mild constipation.   Yes [provider]  ferrous sulfate 325 (65 FE) MG tablet Take 325 mg by mouth daily  with breakfast.   Yes [provider]  gabapentin (NEURONTIN) 300 MG capsule Take 1 capsule (300 mg total) by mouth 3 (three) times daily. Start 329m at bedtime x1 week then may increase to three times per day. Patient taking differently: Take 300 mg by mouth See admin instructions. 3081mat bedtime  30062mid prn neuropathy 10/21/19  Yes MatZigmund Danielody, DO  glimepiride (AMARYL) 4 MG tablet Take 4 mg by mouth daily before breakfast.   Yes [provider]  hydrochlorothiazide (HYDRODIURIL) 25 MG tablet Take 25 mg by mouth daily.  10/09/18  Yes [provider]  losartan (COZAAR) 100 MG tablet Take 100 mg by mouth daily.  05/13/17  Yes [provider]  omeprazole (PRILOSEC) 40 MG capsule Take 1 capsule (40 mg total) by mouth 2 (two) times daily. X 1 month, then once daily Patient taking differently: Take 40 mg by mouth daily.  06/26/16  Yes Tat, DavShanon BrowD  sitaGLIPtin (JANUVIA) 50 MG tablet Take 1 tablet (50 mg total) by mouth daily. 06/26/16  Yes Tat, DavShanon BrowD  spironolactone (ALDACTONE) 25 MG tablet Take 25 mg by mouth daily.   Yes [provider]  Continuous Blood Gluc Receiver (FREESTYLE LIBRE 14 DAY READER) DEVBrunie admin instructions. 05/23/19   [provider]  Continuous Blood Gluc Sensor (FREESTYLE LIBRE 14 DAY SENSOR) MISC 1 Device by Does not apply route every 14 (fourteen) days. 05/23/19   MatLuetta NuttingO     Vitals:   01/14/20 1515 01/14/20 1603 01/14/20 1610 01/14/20 1630  BP: (!) 161/61     Pulse: 64 65 63 61  Resp: _0 Temp:      TempSrc:      SpO2: 100% 100% 100% 100%  Weight:      Height:       Exam Gen WD WN in no distress No rash, cyanosis or gangrene, dry skin in LE's Sclera anicteric, throat clear No jvd or bruits Chest clear bilat to bases no rales or wheezin RRR no MRG, distant HS Abd soft ntnd no mass or ascites +bs GU normal male MS no joint effusions or deformity Ext no leg or UE edema, no wounds  or ulcers Neuro is alert, Ox 3 , nf, no asterixis      Date  Creat  eGFR  2010- 2013 0.74- 0.97  11/2012 1.71  2015  1.73  2016  1.17  >60  2017  1.09  2018  1.08  10/2018 1.33    01/2019 1.30  10/01/19 1.5  54  10/24/19 1.62  48  12/06/19 2.02  37  9/27/ 21 3.11  37  01/13/20 3.31  22  10/05 /21  3.21  20   Home meds:  - norvasc 10/ asa 81/ lipitor 40/ coreg 25 bid/ hctz 25 qd/ losartan 100 qd/ aldactone 25 qd  - januvia 50 qd/ glimepiride 4 am  - neurontin 300 tid/ prilosec 40 qd  - prn's/ vitamins/ supplements   UA 10/5 - negative   Abd CT 01/14/20 - The left kidney demonstrates moderate to marked hydroureteronephrosis down to an obstructing 7.5 mm calculus in the distal left ureter along the upper margin of the left acetabulum. No right-sided ureteral calculi or hydronephrosis. No obvious bladder calculi or mass without contrast. The bladder is moderately obscured by artifact from bilateral hip prostheses.  Assessment/ Plan: 1. AoCKD 3a - b/l creat from 2020 and early 2021 is 1.3- 1.6, eGFR 48- 4m/min.  Creat worsening over last few mos to 2.0 in Aug , 3.1 in Sept and 3.3 here now. UA is negative. CT abd shows severe L hydro w/ nonobstructing 579mstones in bilat lower poles. Both kidneys by CT are somewhat atrophied. Being seen by urology, may need procedure per the patient. Is not uremic. Will start IVF"s, get renal USKoreand urine lytes.  Has room for volume. Will follow. 2. Hyperkalemia - tid lokelma and renal diet, holding aldactone/ ARB appropriately  3. Hx prostate cancer - in 2018 rx w/ radiation 4. HTN - taking ARB, BB and norvasc at home. Hold ARB for now, can use others as needed.  5. DM2 - per pmd      RoKelly SplinterMD 01/14/2020, 4:52 PM  Recent Labs  Lab 01/14/20 1027  WBC 9.4  HGB 9.6*   Recent Labs  Lab 01/13/20 0716 01/14/20 1027  K 6.4* 6.3*  BUN 52* 55*  CREATININE 3.31* 3.20*  CALCIUM 8.9 8.8*

## 2020-01-14 NOTE — Consult Note (Signed)
Reason for Consult: Left Distal Ureteral Stone, Acute Renal Failure with Hyperkalemia, Moderate Risk Prostate Cancer  Referring Physician: Marva Panda MD  [name] is an 74 y.o. male.   HPI:   1 - Left Distal Ureteral Stone - 36mm left distal stone with moderate hydro and bilateral lower pole about 32mm non-obstructing stones by ER CT on eval acute renal failure. H/o one prior episde years ago manged by ureteroscopy by [place].  2 - Acute Renal Failure with Hyperkalemia - Cr 3's with K 6.4 by ER labs 10/5. Baseline Cr <1.5 x several. Given kayexalate and havig copious BM's.    3 -  Moderate Risk Prostate Cancer- s/p radiation 2018 for moderate risk cancer. PSA most recently 0.2.   PMH sig for DM2 (a1c 7s).  Today " [name] " is seen in consultation for above.   Past Medical History:  Diagnosis Date  . Arthritis   . Asthma    no inhaler  . Coronary artery disease    cardiologist-  dr spruill; last visit 3 mos ago per pt  . GERD (gastroesophageal reflux disease)   . History of MI (myocardial infarction)    1985  . Hydronephrosis, left   . Hypertension   . Myocardial infarction ([place])   . Nephrolithiasis    left  . 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 Presence of tooth-root and mandibular implants    lower dental implants  . Prostate cancer ([place])   . Shortness of breath    WITH EXERTION  . Type 2 diabetes mellitus ([place])     Past Surgical History:  Procedure Laterality Date  . BUNIONECTOMY    . CYSTOSCOPY W/ RETROGRADES Left 03/14/2014   Procedure: CYSTOSCOPY WITH LEFT RETROGRADE PYELOGRAM, Left Ureteroscopy, Lweft ureteral Stent No string;  Surgeon: [name], MD;  Location: [facility];  Service: Urology;  Laterality: Left;  . HERNIA REPAIR    . PROSTATE BIOPSY    . TOTAL HIP ARTHROPLASTY Left 03-20-2009  . TOTAL HIP ARTHROPLASTY  04/09/2012   Procedure: TOTAL HIP ARTHROPLASTY;  Surgeon: [name], MD;  [facility];   Service: Orthopedics;  Laterality: Right;  . TOTAL KNEE ARTHROPLASTY Left 05-16-2008    Family History  Problem Relation Age of Onset  . Cancer Mother        breast  . Multiple sclerosis Daughter   . Cancer Father        prostate  . Cancer Maternal Uncle        bone    Social History:  reports that he quit smoking about 35 years ago. His smoking use included cigarettes. He has a 25.00 pack-year smoking history. He quit smokeless tobacco use about 34 years ago. He reports current alcohol use. He reports that he does not use drugs.  Allergies:  Allergies  Allergen Reactions  . Shellfish Allergy Rash    Medications: I have reviewed the patient's current medications.  Results for orders placed or performed during the hospital encounter of 01/14/20 (from the past 48 hour(s))  CBC with Differential     Status: Abnormal   Collection Time: 01/14/20 10:27 AM  Result Value Ref Range   WBC 9.4 4.0 - 10.5 K/uL   RBC 3.03 (L) 4.22 - 5.81 MIL/uL   Hemoglobin 9.6 (L) 13.0 - 17.0 g/dL   HCT 28.6 (L) 39 - 52 %   MCV 94.4 80.0 - 100.0 fL   MCH 31.7 26.0 -  34.0 pg   MCHC 33.6 30.0 - 36.0 g/dL   RDW 13.1 11.5 - 15.5 %   Platelets 453 (H) 150 - 400 K/uL   nRBC 0.0 0.0 - 0.2 %   Neutrophils Relative % 78 %   Neutro Abs 7.3 1.7 - 7.7 K/uL   Lymphocytes Relative 11 %   Lymphs Abs 1.0 0.7 - 4.0 K/uL   Monocytes Relative 8 %   Monocytes Absolute 0.7 0 - 1 K/uL   Eosinophils Relative 3 %   Eosinophils Absolute 0.3 0 - 0 K/uL   Basophils Relative 0 %   Basophils Absolute 0.0 0 - 0 K/uL   Immature Granulocytes 0 %   Abs Immature Granulocytes 0.03 0.00 - 0.07 K/uL    Comment: Performed at [facility], [place] [address], [place], [place] [address]  Comprehensive metabolic panel     Status: Abnormal   Collection Time: 01/14/20 10:27 AM  Result Value Ref Range   Sodium 139 135 - 145 mmol/L   Potassium 6.3 (HH) 3.5 - 5.1 mmol/L    Comment: CRITICAL RESULT CALLED TO, READ BACK  BY AND VERIFIED WITH: HODGES, K. 10.05.2021 @ 1120 by MECIAL J. NO VISIBLE HEMOLYSIS    Chloride 111 98 - 111 mmol/L   CO2 16 (L) 22 - 32 mmol/L   Glucose, Bld 61 (L) 70 - 99 mg/dL    Comment: Glucose reference range applies only to samples taken after fasting for at least 8 hours.   BUN 55 (H) 8 - 23 mg/dL   Creatinine, Ser 3.20 (H) 0.61 - 1.24 mg/dL   Calcium 8.8 (L) 8.9 - 10.3 mg/dL   Total Protein 8.9 (H) 6.5 - 8.1 g/dL   Albumin 4.0 3.5 - 5.0 g/dL   AST 10 (L) 15 - 41 U/L   ALT 12 0 - 44 U/L   Alkaline Phosphatase 70 38 - 126 U/L   Total Bilirubin 0.6 0.3 - 1.2 mg/dL   GFR calc non Af Amer 18 (L) >60 mL/min   Anion gap 12 5 - 15    Comment: Performed at [facility], [place] [address] [place], [place] [address]  Respiratory Panel by RT PCR (Flu A&B, Covid) - Nasopharyngeal Swab     Status: None   Collection Time: 01/14/20 11:10 AM   Specimen: Nasopharyngeal Swab  Result Value Ref Range   SARS Coronavirus 2 by RT PCR NEGATIVE NEGATIVE    Comment: (NOTE) SARS-CoV-2 target nucleic acids are NOT DETECTED.  The SARS-CoV-2 RNA is generally detectable in upper respiratoy specimens during the acute phase of infection. The lowest concentration of SARS-CoV-2 viral copies this assay can detect is 131 copies/mL. A negative result does not preclude SARS-Cov-2 infection and should not be used as the sole basis for treatment or other patient management decisions. A negative result may occur with  improper specimen collection/handling, submission of specimen other than nasopharyngeal swab, presence of viral mutation(s) within the areas targeted by this assay, and inadequate number of viral copies (<131 copies/mL). A negative result must be combined with clinical observations, patient history, and epidemiological information. The expected result is Negative.  Fact Sheet for Patients:  PinkCheek.be  Fact Sheet for Healthcare Providers:   GravelBags.it  This test is no t yet approved or cleared by the [place] FDA and  has been authorized for detection and/or diagnosis of SARS-CoV-2 by FDA under an Emergency Use Authorization (EUA). This EUA will remain  in effect (meaning this test  can be used) for the duration of the COVID-19 declaration under Section 564(b)(1) of the Act, 21 U.S.C. section 360bbb-3(b)(1), unless the authorization is terminated or revoked sooner.     Influenza A by PCR NEGATIVE NEGATIVE   Influenza B by PCR NEGATIVE NEGATIVE    Comment: (NOTE) The Xpert Xpress SARS-CoV-2/FLU/RSV assay is intended as an aid in  the diagnosis of influenza from Nasopharyngeal swab specimens and  should not be used as a sole basis for treatment. Nasal washings and  aspirates are unacceptable for Xpert Xpress SARS-CoV-2/FLU/RSV  testing.  Fact Sheet for Patients: PinkCheek.be  Fact Sheet for Healthcare Providers: GravelBags.it  This test is not yet approved or cleared by the [place] FDA and  has been authorized for detection and/or diagnosis of SARS-CoV-2 by  FDA under an Emergency Use Authorization (EUA). This EUA will remain  in effect (meaning this test can be used) for the duration of the  Covid-19 declaration under Section 564(b)(1) of the Act, 21  U.S.C. section 360bbb-3(b)(1), unless the authorization is  terminated or revoked. Performed at [facility], [place] [address], [place], [place] [address]   Urinalysis, Routine w reflex microscopic Urine, Clean Catch     Status: Abnormal   Collection Time: 01/14/20  3:44 PM  Result Value Ref Range   Color, Urine STRAW (A) YELLOW   APPearance CLEAR CLEAR   Specific Gravity, Urine 1.013 1.005 - 1.030   pH 5.0 5.0 - 8.0   Glucose, UA NEGATIVE NEGATIVE mg/dL   Hgb urine dipstick NEGATIVE NEGATIVE   Bilirubin Urine NEGATIVE NEGATIVE   Ketones, ur  NEGATIVE NEGATIVE mg/dL   Protein, ur NEGATIVE NEGATIVE mg/dL   Nitrite NEGATIVE NEGATIVE   Leukocytes,Ua NEGATIVE NEGATIVE    Comment: Performed at [place] [address], [place], [place] [address]  POC CBG, ED     Status: Abnormal   Collection Time: 01/14/20  3:56 PM  Result Value Ref Range   Glucose-Capillary 52 (L) 70 - 99 mg/dL    Comment: Glucose reference range applies only to samples taken after fasting for at least 8 hours.    CT ABDOMEN PELVIS WO CONTRAST  Result Date: 01/14/2020 CLINICAL DATA:  Abdominal distension.  Elevated BUN and creatinine. EXAM: CT ABDOMEN AND PELVIS WITHOUT CONTRAST TECHNIQUE: Multidetector CT imaging of the abdomen and pelvis was performed following the standard protocol without IV contrast. COMPARISON:  07/25/2013 FINDINGS: Lower chest: The lung bases are clear of an acute process. No pleural effusion or pulmonary lesions. The heart is within normal limits in size for age. No pericardial effusion. Scattered aortic and coronary artery calcifications are noted. The distal esophagus is grossly normal. Hepatobiliary: No hepatic lesions are identified without contrast. The gallbladder appears normal. No common bile duct dilatation. Pancreas: No mass, inflammation or ductal dilatation. Spleen: Normal size. There is a stable low-attenuation lesion consistent with a benign cyst. Adrenals/Urinary Tract: The adrenal glands are unremarkable. Bilateral renal calculi are noted. The left kidney demonstrates moderate to marked hydroureteronephrosis down to an obstructing 7.5 mm calculus in the distal left ureter along the upper margin of the left acetabulum. No right-sided ureteral calculi or hydronephrosis. No obvious bladder calculi or mass without contrast. The bladder is moderately obscured by artifact from bilateral hip prostheses. Stomach/Bowel: The stomach, duodenum, small bowel and colon are unremarkable. No acute inflammatory changes, mass  lesions or obstructive findings. Vascular/Lymphatic: Advanced vascular calcifications but no aneurysm. No mesenteric or retroperitoneal mass or adenopathy. Reproductive: The  prostate gland and seminal vesicles are grossly normal. There appear to be scattered brachytherapy seeds in the prostate gland. Other: Left inguinal hernia containing fat is noted. No inguinal adenopathy. Small periumbilical abdominal wall hernia containing fat. Musculoskeletal: No significant bony findings. IMPRESSION: 1. 7.5 mm distal left ureteral calculus causing moderate to marked left-sided hydroureteronephrosis. 2. Bilateral renal calculi. 3. No other significant abdominal/pelvic findings, mass lesions or adenopathy. 4. Advanced vascular calcifications. 5. Aortic atherosclerosis. Aortic Atherosclerosis (ICD10-I70.0). Electronically Signed   By: Marijo Sanes M.D.   On: 01/14/2020 15:37    Review of Systems  Constitutional: Negative for chills and fever.  Genitourinary: Negative for difficulty urinating and flank pain.  All other systems reviewed and are negative.  Blood pressure (!) 161/61, pulse 63, temperature 98.2 F (36.8 C), temperature source Oral, resp. rate 14, height 6\' 1"  (1.854 m), weight 108.9 kg, SpO2 100 %. Physical Exam Vitals reviewed.  Constitutional:      Comments: Family at bedside.   HENT:     Head: Normocephalic.     Nose: Nose normal.  Eyes:     Pupils: Pupils are equal, round, and reactive to light.  Cardiovascular:     Rate and Rhythm: Normal rate.  Pulmonary:     Effort: Pulmonary effort is normal.  Abdominal:     Comments: Moderate truncal obesity.   Genitourinary:    Comments: No CVAT Neurological:     General: No focal deficit present.     Mental Status: He is alert.  Psychiatric:        Mood and Affect: Mood normal.     Assessment/Plan:   1 - Left Distal Ureteral Stone - likely part of factor driving ARF. Rec maneuvers to address hyperkalemia medically (in progress), then  bilateral stent placement tomorrow (should be safer as not anuric now and be best when K trending down), then bilateral ureteroscopy in elective setting in few weeks.  NPO p MN for likely surgery for bilateral stetns tomorrow afternoon.   2 - Acute Renal Failure with Hyperkalemia - from unilateral obstruction + poor PO intake. Appreciate hospitalist and nephrology comanagement. Plan for renal decompression as per above.   3 -  Moderate Risk Prostate Cancer- great biochemical control.   Alexis Frock 01/14/2020, 4:38 PM

## 2020-01-14 NOTE — ED Triage Notes (Signed)
Patient states he was called by his PCP office today for abnormal lab. K+-6.4, BUN-52, and Creat-3.31

## 2020-01-14 NOTE — ED Provider Notes (Addendum)
Care handoff received from [facility] PA-C at shift change please see previous provider note for full details of visit.  In short 74 year old male presented for hyperkalemia and AKI from PCPs office.  Reports abdominal distention but no urinary symptoms.  Creatinine 3.2, BUN 35, potassium 6.3.  EKG shows some slight peaking of T waves.  He was treated with insulin, calcium gluconate, Lokelma.  Nephrology was consulted, previous team spoke with Dr. Jonnie Finner who will see patient in consult.  Plan of care is to await CT abdomen pelvis and then call hospitalist for admission. Physical Exam  BP (!) 165/65   Pulse 67   Temp 98.2 F (36.8 C) (Oral)   Resp 18   Ht 6\' 1"  (1.854 m)   Wt 108.9 kg   SpO2 100%   BMI 31.66 kg/m   Physical Exam Constitutional:      General: He is not in acute distress.    Appearance: Normal appearance. He is well-developed. He is not ill-appearing or diaphoretic.  HENT:     Head: Normocephalic and atraumatic.  Eyes:     General: Vision grossly intact. Gaze aligned appropriately.     Pupils: Pupils are equal, round, and reactive to light.  Neck:     Trachea: Trachea and phonation normal.  Pulmonary:     Effort: Pulmonary effort is normal. No respiratory distress.  Musculoskeletal:        General: Normal range of motion.     Cervical back: Normal range of motion.  Skin:    General: Skin is warm and dry.  Neurological:     Mental Status: He is alert.     GCS: GCS eye subscore is 4. GCS verbal subscore is 5. GCS motor subscore is 6.     Comments: Speech is clear and goal oriented, follows commands Major Cranial nerves without deficit, no facial droop Moves extremities without ataxia, coordination intact  Psychiatric:        Behavior: Behavior normal.     ED Course/Procedures   Clinical Course as of Jan 14 1612  Tue Jan 14, 2020  1542 Dr. Tresa Moore   [BM]  1551 Dr. Raiford Noble   [BM]    Clinical Course User Index [BM] Nuala Alpha A, PA-C     Procedures  MDM  CT AP:  IMPRESSION:  1. 7.5 mm distal left ureteral calculus causing moderate to marked  left-sided hydroureteronephrosis.  2. Bilateral renal calculi.  3. No other significant abdominal/pelvic findings, mass lesions or  adenopathy.  4. Advanced vascular calcifications.  5. Aortic atherosclerosis.    Aortic Atherosclerosis (ICD10-I70.0).  - 3:39 PM: Patient reevaluated he is resting comfortably in bed no acute distress reports he is feeling well.  He was surprised to learn he had a left-sided kidney stone today.  He is agreeable for admission.  Urinalysis collected and in process. - 3:42 PM: Discussed case with urologist Dr. Tresa Moore, advises they will be by to see patient later on tonight, possible intervention tomorrow. - 3:51 PM: Discussed case with Dr. Neysa Bonito, patient accepted to hospitalist service for admission. - CBG rechecked, 52.  Patient reassessed he is asymptomatic, normal mentation.  Patient given orange juice and an amp D50 ordered.  RN to recheck.  Case was discussed with Dr. Melina Copa during this visit who agrees with treatment and admission.  Note: Portions of this report may have been transcribed using voice recognition software. Every effort was made to ensure accuracy; however, inadvertent computerized transcription errors may still be present.  Deliah Boston, PA-C 01/14/20 1616    Deliah Boston, PA-C 01/14/20 1616    Hayden Rasmussen, MD 01/15/20 1053

## 2020-01-14 NOTE — ED Notes (Addendum)
Date and time results received: 01/14/20 11:29  Test: Potassium Critical Value: 6.3   Schlossman, MD aware

## 2020-01-14 NOTE — ED Provider Notes (Signed)
[facility] DEPT Provider Note   CSN: [id number] Arrival date & time: 01/14/20  1443     History Chief Complaint  Patient presents with  . Abnormal Lab    [name] is a 74 y.o. male with a past medical history of hypertension, diabetes, CAD presenting to the ED for abnormal lab work.  Patient was sent to have blood work done yesterday by his PCP who he was told is checking his kidney function.  He was told to come immediately to the ER due to elevation in potassium.  Dates that over the past few weeks has been feeling bloated.  He was given a new medication several weeks ago for leg pain but does not member the name of, has not been taking it recently.  Denies any chest pain, shortness of breath, vomiting, hemoptysis.  Has been having intermittent constipation which improved with the laxative.  Denies any urinary symptoms.  HPI     Past Medical History:  Diagnosis Date  . Arthritis   . Asthma    no inhaler  . Coronary artery disease    cardiologist-  dr spruill; last visit 3 mos ago per pt  . GERD (gastroesophageal reflux disease)   . History of MI (myocardial infarction)    1985  . Hydronephrosis, left   . Hypertension   . Myocardial infarction ([place])   . Nephrolithiasis    left  . 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 Presence of tooth-root and mandibular implants    lower dental implants  . Prostate cancer ([place])   . Shortness of breath    WITH EXERTION  . Type 2 diabetes mellitus [facility])     Patient Active Problem List   Diagnosis Date Noted  . Colon cancer screening 04/28/2019  . Intermittent claudication ([place]) 01/22/2019  . GERD (gastroesophageal reflux disease) 10/23/2018  . Pseudophakia 08/11/2016  . Uncontrolled type 2 diabetes mellitus with hyperglycemia, without long-term current use of insulin ([place]) 06/26/2016  . Hyperlipidemia with target LDL less than 70 06/26/2016  . Angina pectoris ([place])  06/26/2016  . Essential hypertension 06/24/2016  . CAD (coronary artery disease) 06/24/2016  . Asthma 06/24/2016  . Malignant neoplasm of prostate ([place]) 12/28/2015  . OA (osteoarthritis) of hip 04/09/2012    Past Surgical History:  Procedure Laterality Date  . BUNIONECTOMY    . CYSTOSCOPY W/ RETROGRADES Left 03/14/2014   Procedure: CYSTOSCOPY WITH LEFT RETROGRADE PYELOGRAM, Left Ureteroscopy, [place] ureteral Stent No string;  Surgeon: [name], MD;  Location: [facility];  Service: Urology;  Laterality: Left;  . HERNIA REPAIR    . PROSTATE BIOPSY    . TOTAL HIP ARTHROPLASTY Left 03-20-2009  . TOTAL HIP ARTHROPLASTY  04/09/2012   Procedure: TOTAL HIP ARTHROPLASTY;  Surgeon: [name], MD;  Location: [facility];  Service: Orthopedics;  Laterality: Right;  . TOTAL KNEE ARTHROPLASTY Left 05-16-2008       Family History  Problem Relation Age of Onset  . Cancer Mother        breast  . Multiple sclerosis Daughter   . Cancer Father        prostate  . Cancer Maternal Uncle        bone    Social History   Tobacco Use  . Smoking status: Former Smoker    Packs/day: 1.00    Years: 25.00    Pack years: 25.00    Types: Cigarettes  Quit date: 04/11/1984    Years since quitting: 35.7  . Smokeless tobacco: Former Systems developer    Quit date: 04/02/1985  Vaping Use  . Vaping Use: Never used  Substance Use Topics  . Alcohol use: Yes    Comment: RARE  . Drug use: No    Home Medications Prior to Admission medications   Medication Sig Start Date End Date Taking? Authorizing Provider  albuterol (VENTOLIN HFA) 108 (90 Base) MCG/ACT inhaler Inhale 2 puffs into the lungs every 6 (six) hours as needed for wheezing or shortness of breath. 08/09/19 01/14/20 Yes Luetta Nutting, DO  amLODipine (NORVASC) 10 MG tablet Take 10 mg by mouth daily.   Yes [provider]  Aspirin 81 MG CAPS Take 81 mg by mouth daily.    Yes [provider]  atorvastatin (LIPITOR) 40 MG  tablet Take 1 tablet (40 mg total) by mouth daily. 06/26/16  Yes Tat, Shanon Brow, MD  carvedilol (COREG) 25 MG tablet Take 25 mg by mouth 2 (two) times daily with a meal.   Yes [provider]  co-enzyme Q-10 30 MG capsule Take 30 mg by mouth daily.   Yes [provider]  cycloSPORINE (RESTASIS) 0.05 % ophthalmic emulsion Place 1 drop into both eyes 2 (two) times daily.  09/23/19  Yes [provider]  docusate sodium (COLACE) 100 MG capsule Take 100 mg by mouth daily as needed for mild constipation.   Yes [provider]  ferrous sulfate 325 (65 FE) MG tablet Take 325 mg by mouth daily with breakfast.   Yes [provider]  gabapentin (NEURONTIN) 300 MG capsule Take 1 capsule (300 mg total) by mouth 3 (three) times daily. Start 300mg  at bedtime x1 week then may increase to three times per day. Patient taking differently: Take 300 mg by mouth See admin instructions. 300mg  at bedtime  300mg  bid prn neuropathy 10/21/19  Yes Luetta Nutting, DO  glimepiride (AMARYL) 4 MG tablet Take 4 mg by mouth daily before breakfast.   Yes [provider]  hydrochlorothiazide (HYDRODIURIL) 25 MG tablet Take 25 mg by mouth daily.  10/09/18  Yes [provider]  losartan (COZAAR) 100 MG tablet Take 100 mg by mouth daily.  05/13/17  Yes [provider]  omeprazole (PRILOSEC) 40 MG capsule Take 1 capsule (40 mg total) by mouth 2 (two) times daily. X 1 month, then once daily Patient taking differently: Take 40 mg by mouth daily.  06/26/16  Yes Tat, Shanon Brow, MD  sitaGLIPtin (JANUVIA) 50 MG tablet Take 1 tablet (50 mg total) by mouth daily. 06/26/16  Yes Tat, Shanon Brow, MD  spironolactone (ALDACTONE) 25 MG tablet Take 25 mg by mouth daily.   Yes [provider]  Continuous Blood Gluc Receiver (FREESTYLE LIBRE 14 DAY READER) [place] See admin instructions. 05/23/19   [provider]  Continuous Blood Gluc Sensor (FREESTYLE LIBRE 14 DAY SENSOR) MISC 1 Device by  Does not apply route every 14 (fourteen) days. 05/23/19   Luetta Nutting, DO    Allergies    Shellfish allergy  Review of Systems   Review of Systems  Constitutional: Negative for appetite change, chills and fever.  HENT: Negative for ear pain, rhinorrhea, sneezing and sore throat.   Eyes: Negative for photophobia and visual disturbance.  Respiratory: Negative for cough, chest tightness, shortness of breath and wheezing.   Cardiovascular: Negative for chest pain and palpitations.  Gastrointestinal: Positive for constipation. Negative for abdominal pain, blood in stool, diarrhea, nausea and vomiting.  Genitourinary: Negative for dysuria, hematuria and urgency.  Musculoskeletal: Negative for myalgias.  Skin: Negative for rash.  Neurological: Negative for dizziness, weakness and light-headedness.    Physical Exam Updated Vital Signs BP (!) 165/65   Pulse 67   Temp 98.2 F (36.8 C) (Oral)   Resp 18   Ht 6\' 1"  (1.854 m)   Wt 108.9 kg   SpO2 100%   BMI 31.66 kg/m   Physical Exam Vitals and nursing note reviewed.  Constitutional:      General: He is not in acute distress.    Appearance: He is well-developed. He is obese.  HENT:     Head: Normocephalic and atraumatic.     Nose: Nose normal.  Eyes:     General: No scleral icterus.       Right eye: No discharge.        Left eye: No discharge.     Conjunctiva/sclera: Conjunctivae normal.  Cardiovascular:     Rate and Rhythm: Normal rate and regular rhythm.     Heart sounds: Normal heart sounds. No murmur heard.  No friction rub. No gallop.   Pulmonary:     Effort: Pulmonary effort is normal. No respiratory distress.     Breath sounds: Normal breath sounds.  Abdominal:     General: Bowel sounds are normal. There is distension.     Palpations: Abdomen is soft.     Tenderness: There is no abdominal tenderness. There is no guarding.  Musculoskeletal:        General: Normal range of motion.     Cervical back: Normal range of  motion and neck supple.  Skin:    General: Skin is warm and dry.     Findings: No rash.  Neurological:     Mental Status: He is alert.     Motor: No abnormal muscle tone.     Coordination: Coordination normal.     ED Results / Procedures / Treatments   Labs (all labs ordered are listed, but only abnormal results are displayed) Labs Reviewed  CBC WITH DIFFERENTIAL/PLATELET - Abnormal; Notable for the following components:      Result Value   RBC 3.03 (*)    Hemoglobin 9.6 (*)    HCT 28.6 (*)    Platelets 453 (*)    All other components within normal limits  COMPREHENSIVE METABOLIC PANEL - Abnormal; Notable for the following components:   Potassium 6.3 (*)    CO2 16 (*)    Glucose, Bld 61 (*)    BUN 55 (*)    Creatinine, Ser 3.20 (*)    Calcium 8.8 (*)    Total Protein 8.9 (*)    AST 10 (*)    GFR calc non Af Amer 18 (*)    All other components within normal limits  RESPIRATORY PANEL BY RT PCR (FLU A&B, COVID)  URINE CULTURE  URINALYSIS, ROUTINE W REFLEX MICROSCOPIC    EKG EKG Interpretation  Date/Time:  Tuesday January 14 2020 10:27:34 EDT Ventricular Rate:  65 PR Interval:    QRS Duration: 98 QT Interval:  398 QTC Calculation: 414 R Axis:   41 Text Interpretation: Sinus rhythm Prolonged PR interval Abnormal R-wave progression, early transition Nonspecific TW changes, slightly more peaked TW in comparison to prior Confirmed by Gareth Morgan [phone]) on 01/14/2020 10:45:27 AM   Radiology No results found.  Procedures .Critical Care Performed by: Delia Heady, PA-C Authorized by: Delia Heady, PA-C   Critical care provider statement:  Critical care time (minutes):  35   Critical care was necessary to treat or prevent imminent or life-threatening deterioration of the following conditions:  Metabolic crisis and renal failure   Critical care was time spent personally by me on the following activities:  Development of treatment plan with patient or surrogate,  obtaining history from patient or surrogate, discussions with consultants, evaluation of patient's response to treatment, examination of patient, ordering and performing treatments and interventions, pulse oximetry, re-evaluation of patient's condition, review of old charts and ordering and review of laboratory studies   I assumed direction of critical care for this patient from another provider in my specialty: no     (including critical care time)  Medications Ordered in ED Medications  calcium gluconate 1 g/ 50 mL sodium chloride IVPB (0 g Intravenous Stopped 01/14/20 1257)  insulin aspart (novoLOG) injection 5 Units (5 Units Intravenous Given 01/14/20 1115)  dextrose 50 % solution 50 mL (50 mLs Intravenous Given 01/14/20 1110)  sodium zirconium cyclosilicate (LOKELMA) packet 10 g (10 g Oral Given 01/14/20 1158)  iohexol (OMNIPAQUE) 9 MG/ML oral solution 500 mL (500 mLs Oral Contrast Given 01/14/20 1512)    ED Course  I have reviewed the triage vital signs and the nursing notes.  Pertinent labs & imaging results that were available during my care of the patient were reviewed by me and considered in my medical decision making (see chart for details).    MDM Rules/Calculators/A&P                          74 year old male presenting to the ED for abnormal lab work.  Was sent by his PCP for hyperkalemia and AKI.  Chart review shows that back in August, he had slight elevation in creatinine to 2.  This was thought to be secondary to recent GI illness and dehydration.  He had repeat blood work done which showed creatinine of 3.  Yesterday potassium was elevated at 6.4.  Reports abdominal distention but denies any urinary symptoms.  Abdomen is distended on today's exam.  Recheck of lab work does show creatinine of 3.2, BUN of 55 and hyperkalemia of 6.3.  CBC with hemoglobin at baseline.  EKG with slightly more peaked T waves than prior.  Patient given insulin, calcium gluconate and Lokelma to help with  elevated potassium.   Will need CT of the abdomen pelvis without contrast to evaluate for cause of distention versus obstructive cause.  Spoke to Dr. Jonnie Finner, nephrology who will see the patient in consult.  3:18 PM Care handed off to oncoming team pending CT, UA. Patient will need admission for his hyperkalemia and AKI at the very least. Patient discussed with and seen by the attending, Dr. Billy Fischer.  Portions of this note were generated with [employer]. Dictation errors may occur despite best attempts at proofreading.  Final Clinical Impression(s) / ED Diagnoses Final diagnoses:  AKI (acute kidney injury) ([place])  Hyperkalemia    Rx / DC Orders ED Discharge Orders    None       Delia Heady, PA-C 01/14/20 1521    Gareth Morgan, MD 02/20/20 [phone]

## 2020-01-14 NOTE — H&P (Signed)
History and Physical        Hospital Admission Note Date: 01/14/2020  Patient name: [name] Medical record number: [id number] Date of birth: 16-Oct-1945 Age: 74 y.o. Gender: male  PCP: Luetta Nutting, DO  Patient coming from: Home Lives with: Wife At baseline, ambulates: Independently  Chief Complaint    Chief Complaint  Patient presents with  . Abnormal Lab      HPI:   This is a 74 year old male with past medical history of hypertension, diabetes, CAD, MI who presented to the ED with abnormal lab work.  He had lab work done yesterday by his PCP to follow-up on his renal function.  Upon chart review the patient had elevated creatinine to 2 in August which was thought to be secondary to a recent GI illness and dehydration however repeat blood work since that time showed an increased creatinine of 3.  He was told to immediately come to the ER due to elevation in potassium.  He has been feeling bloated over the past several weeks and constipated for 2 weeks.  Per ED provider he was given a new medication for leg pain but does not know the name of it and has not been taking it recently.  No shortness of breath, chest pain, vomiting, hemoptysis.  No other issues.  ED Course: Afebrile, hypertensive and otherwise stable on room air.  Notable labs: K6.3, CO2 16, glucose 61, BUN 55, creatinine 3.2, Hb 9.6, COVID-19 negative, UA unremarkable.  CT abdomen pelvis ordered due to abdominal distention: 7.5 mm distal left ureteral calculus causing moderate to marked left-sided hydro with bilateral renal calculi and advanced vascular calcifications.  He was given NovoLog 5 units, Lokelma, calcium gluconate and 1 amp of D50.  EKG showed first-degree AV block with slight peaked T waves on personal read.  ED physician consulted nephrology and urology.  Vitals:   01/14/20 1610 01/14/20 1630  BP:     Pulse: 63 61  Resp: 14 19  Temp:    SpO2: 100% 100%     Review of Systems:  Review of Systems  Constitutional: Negative for chills and fever.  Respiratory: Negative for shortness of breath and wheezing.   Cardiovascular: Negative for chest pain and palpitations.  Gastrointestinal: Positive for constipation. Negative for nausea and vomiting.  Genitourinary: Negative for dysuria.  Musculoskeletal: Negative for myalgias.       Chronic low back pain  Neurological: Negative.   Psychiatric/Behavioral: Negative.   All other systems reviewed and are negative.   Medical/Social/Family History   Past Medical History: Past Medical History:  Diagnosis Date  . Arthritis   . Asthma    no inhaler  . Coronary artery disease    cardiologist-  dr spruill; last visit 3 mos ago per pt  . GERD (gastroesophageal reflux disease)   . History of MI (myocardial infarction)    1985  . Hydronephrosis, left   . Hypertension   . Myocardial infarction ([place])   . Nephrolithiasis    left  . 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 Presence of tooth-root and mandibular implants    lower dental implants  . Prostate cancer ([place])   . Shortness  of breath    WITH EXERTION  . Type 2 diabetes mellitus ([place])     Past Surgical History:  Procedure Laterality Date  . BUNIONECTOMY    . CYSTOSCOPY W/ RETROGRADES Left 03/14/2014   Procedure: CYSTOSCOPY WITH LEFT RETROGRADE PYELOGRAM, Left Ureteroscopy, [place] ureteral Stent No string;  Surgeon: [name], MD;  Location: [facility];  Service: Urology;  Laterality: Left;  . HERNIA REPAIR    . PROSTATE BIOPSY    . TOTAL HIP ARTHROPLASTY Left 03-20-2009  . TOTAL HIP ARTHROPLASTY  04/09/2012   Procedure: TOTAL HIP ARTHROPLASTY;  Surgeon: [name], MD;  Location: [facility];  Service: Orthopedics;  Laterality: Right;  . TOTAL KNEE ARTHROPLASTY Left 05-16-2008    Medications: Prior to Admission medications   Medication  Sig Start Date End Date Taking? Authorizing Provider  albuterol (VENTOLIN HFA) 108 (90 Base) MCG/ACT inhaler Inhale 2 puffs into the lungs every 6 (six) hours as needed for wheezing or shortness of breath. 08/09/19 01/14/20 Yes Luetta Nutting, DO  amLODipine (NORVASC) 10 MG tablet Take 10 mg by mouth daily.   Yes [provider]  Aspirin 81 MG CAPS Take 81 mg by mouth daily.    Yes [provider]  atorvastatin (LIPITOR) 40 MG tablet Take 1 tablet (40 mg total) by mouth daily. 06/26/16  Yes Tat, Shanon Brow, MD  carvedilol (COREG) 25 MG tablet Take 25 mg by mouth 2 (two) times daily with a meal.   Yes [provider]  co-enzyme Q-10 30 MG capsule Take 30 mg by mouth daily.   Yes [provider]  cycloSPORINE (RESTASIS) 0.05 % ophthalmic emulsion Place 1 drop into both eyes 2 (two) times daily.  09/23/19  Yes [provider]  docusate sodium (COLACE) 100 MG capsule Take 100 mg by mouth daily as needed for mild constipation.   Yes [provider]  ferrous sulfate 325 (65 FE) MG tablet Take 325 mg by mouth daily with breakfast.   Yes [provider]  gabapentin (NEURONTIN) 300 MG capsule Take 1 capsule (300 mg total) by mouth 3 (three) times daily. Start 300mg  at bedtime x1 week then may increase to three times per day. Patient taking differently: Take 300 mg by mouth See admin instructions. 300mg  at bedtime  300mg  bid prn neuropathy 10/21/19  Yes Zigmund Daniel, Cody, DO  glimepiride (AMARYL) 4 MG tablet Take 4 mg by mouth daily before breakfast.   Yes [provider]  hydrochlorothiazide (HYDRODIURIL) 25 MG tablet Take 25 mg by mouth daily.  10/09/18  Yes [provider]  losartan (COZAAR) 100 MG tablet Take 100 mg by mouth daily.  05/13/17  Yes [provider]  omeprazole (PRILOSEC) 40 MG capsule Take 1 capsule (40 mg total) by mouth 2 (two) times daily. X 1 month, then once daily Patient taking differently: Take 40 mg by mouth  daily.  06/26/16  Yes Tat, Shanon Brow, MD  sitaGLIPtin (JANUVIA) 50 MG tablet Take 1 tablet (50 mg total) by mouth daily. 06/26/16  Yes Tat, Shanon Brow, MD  spironolactone (ALDACTONE) 25 MG tablet Take 25 mg by mouth daily.   Yes [provider]  Continuous Blood Gluc Receiver (FREESTYLE LIBRE 14 DAY READER) [place] See admin instructions. 05/23/19   [provider]  Continuous Blood Gluc Sensor (FREESTYLE LIBRE 14 DAY SENSOR) MISC 1 Device by Does not apply route every 14 (fourteen) days. 05/23/19   Luetta Nutting, DO    Allergies:   Allergies  Allergen Reactions  .  Shellfish Allergy Rash    Social History:  reports that he quit smoking about 35 years ago. His smoking use included cigarettes. He has a 25.00 pack-year smoking history. He quit smokeless tobacco use about 34 years ago. He reports current alcohol use. He reports that he does not use drugs.  Family History: Family History  Problem Relation Age of Onset  . Cancer Mother        breast  . Multiple sclerosis Daughter   . Cancer Father        prostate  . Cancer Maternal Uncle        bone     Objective   Physical Exam: Blood pressure (!) 161/61, pulse 61, temperature 98.2 F (36.8 C), temperature source Oral, resp. rate 19, height 6\' 1"  (1.854 m), weight 108.9 kg, SpO2 100 %.  Physical Exam Vitals and nursing note reviewed.  Constitutional:      Appearance: Normal appearance.  HENT:     Head: Normocephalic and atraumatic.  Eyes:     Conjunctiva/sclera: Conjunctivae normal.  Cardiovascular:     Rate and Rhythm: Normal rate and regular rhythm.  Pulmonary:     Effort: Pulmonary effort is normal.     Breath sounds: Normal breath sounds.  Abdominal:     General: There is distension.     Tenderness: There is no abdominal tenderness.  Musculoskeletal:        General: No swelling or tenderness.     Comments: Negative CVA tenderness  Skin:    Coloration: Skin is not jaundiced or pale.  Neurological:     Mental  Status: He is alert. Mental status is at baseline.  Psychiatric:        Mood and Affect: Mood normal.        Behavior: Behavior normal.     LABS on Admission: I have personally reviewed all the labs and imaging below    Basic Metabolic Panel: Recent Labs  Lab 01/13/20 0716 01/14/20 1027  NA 136 139  K 6.4* 6.3*  CL 110 111  CO2 19* 16*  GLUCOSE 58* 61*  BUN 52* 55*  CREATININE 3.31* 3.20*  CALCIUM 8.9 8.8*   Liver Function Tests: Recent Labs  Lab 01/14/20 1027  AST 10*  ALT 12  ALKPHOS 70  BILITOT 0.6  PROT 8.9*  ALBUMIN 4.0   No results for input(s): LIPASE, AMYLASE in the last 168 hours. No results for input(s): AMMONIA in the last 168 hours. CBC: Recent Labs  Lab 01/14/20 1027  WBC 9.4  NEUTROABS 7.3  HGB 9.6*  HCT 28.6*  MCV 94.4  PLT 453*   Cardiac Enzymes: No results for input(s): CKTOTAL, CKMB, CKMBINDEX, TROPONINI in the last 168 hours. BNP: Invalid input(s): POCBNP CBG: Recent Labs  Lab 01/14/20 1556 01/14/20 1656  GLUCAP 52* 162*    Radiological Exams on Admission:  CT ABDOMEN PELVIS WO CONTRAST  Result Date: 01/14/2020 CLINICAL DATA:  Abdominal distension.  Elevated BUN and creatinine. EXAM: CT ABDOMEN AND PELVIS WITHOUT CONTRAST TECHNIQUE: Multidetector CT imaging of the abdomen and pelvis was performed following the standard protocol without IV contrast. COMPARISON:  07/25/2013 FINDINGS: Lower chest: The lung bases are clear of an acute process. No pleural effusion or pulmonary lesions. The heart is within normal limits in size for age. No pericardial effusion. Scattered aortic and coronary artery calcifications are noted. The distal esophagus is grossly normal. Hepatobiliary: No hepatic lesions are identified without contrast. The gallbladder appears normal. No common bile duct dilatation.  Pancreas: No mass, inflammation or ductal dilatation. Spleen: Normal size. There is a stable low-attenuation lesion consistent with a benign cyst.  Adrenals/Urinary Tract: The adrenal glands are unremarkable. Bilateral renal calculi are noted. The left kidney demonstrates moderate to marked hydroureteronephrosis down to an obstructing 7.5 mm calculus in the distal left ureter along the upper margin of the left acetabulum. No right-sided ureteral calculi or hydronephrosis. No obvious bladder calculi or mass without contrast. The bladder is moderately obscured by artifact from bilateral hip prostheses. Stomach/Bowel: The stomach, duodenum, small bowel and colon are unremarkable. No acute inflammatory changes, mass lesions or obstructive findings. Vascular/Lymphatic: Advanced vascular calcifications but no aneurysm. No mesenteric or retroperitoneal mass or adenopathy. Reproductive: The prostate gland and seminal vesicles are grossly normal. There appear to be scattered brachytherapy seeds in the prostate gland. Other: Left inguinal hernia containing fat is noted. No inguinal adenopathy. Small periumbilical abdominal wall hernia containing fat. Musculoskeletal: No significant bony findings. IMPRESSION: 1. 7.5 mm distal left ureteral calculus causing moderate to marked left-sided hydroureteronephrosis. 2. Bilateral renal calculi. 3. No other significant abdominal/pelvic findings, mass lesions or adenopathy. 4. Advanced vascular calcifications. 5. Aortic atherosclerosis. Aortic Atherosclerosis (ICD10-I70.0). Electronically Signed   By: Marijo Sanes M.D.   On: 01/14/2020 15:37      EKG: Independently reviewed.    A & P   Principal Problem:   Hyperkalemia Active Problems:   Type 2 diabetes mellitus with hypoglycemia without coma (HCC)   Essential hypertension   CAD (coronary artery disease)   Hyperlipidemia with target LDL less than 70   AKI (acute kidney injury) (HCC)   Hydroureteronephrosis   1. Asymptomatic hyperkalemia a. Had slight peaked T waves on personal read of EKG b. given NovoLog 5 units, Lokelma, calcium gluconate c. Holding  spironolactone, losartan and HCTZ d. Repeat BMP this evening e. Nephrology consulted  2. 7.5 mm left ureteral calculus with moderate to marked left hydroureteronephrosis a. Urology consulted, discussed with Dr. Tresa Moore at bedside b. Plan for urologic procedure tomorrow, n.p.o. after midnight  3. AKI on CKD 3 a a. Baseline creatinine from 2020 in early 2021 is 1.3-1.6 and has been worsening over the past several months, currently 3.3 b. UA negative c. Severe left hydro as above d. Holding spironolactone, losartan and HCTZ as above e. Nephrology consulted  4. Hypoglycemia with diabetes a. Glucose in the 60s on admission b. Received D50 in the ED after getting insulin for hyperkalemia and hypoglycemia resolved c. Holding home glimepiride and sitagliptin d. Sensitive sliding scale in setting of renal dysfunction  5. Hypertension a. Continue home amlodipine and carvedilol and holding meds as above b. As needed hydralazine  6. Hyperlipidemia  CAD a. Continue home beta-blocker, aspirin and statin  7. Constipation a. Laxatives   DVT prophylaxis: Heparin   Code Status: Prior  Diet: Renal diet/carb modified, n.p.o. after midnight Family Communication: Admission, patients condition and plan of care including tests being ordered have been discussed with the patient who indicates understanding and agrees with the plan and Code Status. Patient's Wife l was updated  Disposition Plan: The appropriate patient status for this patient is INPATIENT. Inpatient status is judged to be reasonable and necessary in order to provide the required intensity of service to ensure the patient's safety. The patient's presenting symptoms, physical exam findings, and initial radiographic and laboratory data in the context of their chronic comorbidities is felt to place them at high risk for further clinical deterioration. Furthermore, it is not anticipated that the  patient will be medically stable for discharge from  the hospital within 2 midnights of admission. The following factors support the patient status of inpatient.   " The patient's presenting symptoms include abnormal labs. " The worrisome physical exam findings include abdominal bloating. " The initial radiographic and laboratory data are worrisome because of left ureteral calculus with hydroureteronephrosis, hyperkalemia, AKI, hyperglycemia. " The chronic co-morbidities include CAD and MI, hypertension, hyperlipidemia.   * I certify that at the point of admission it is my clinical judgment that the patient will require inpatient hospital care spanning beyond 2 midnights from the point of admission due to high intensity of service, high risk for further deterioration and high frequency of surveillance required.*   Status is: Inpatient  Remains inpatient appropriate because:Persistent severe electrolyte disturbances, IV treatments appropriate due to intensity of illness or inability to take PO and Inpatient level of care appropriate due to severity of illness   Dispo: The patient is from: Home              Anticipated d/c is to: Home              Anticipated d/c date is: 2 days              Patient currently is not medically stable to d/c.         The medical decision making on this patient was of high complexity and the patient is at high risk for clinical deterioration, therefore this is a level 3  admission.  Consultants  . Urology . Nephrology  Procedures  . None  Time Spent on Admission: 75 minutes    Harold Hedge, DO [facility]  01/14/2020, 5:34 PM

## 2020-01-15 ENCOUNTER — Inpatient Hospital Stay (HOSPITAL_COMMUNITY): Payer: HMO | Admitting: Anesthesiology

## 2020-01-15 ENCOUNTER — Inpatient Hospital Stay (HOSPITAL_COMMUNITY): Payer: HMO

## 2020-01-15 ENCOUNTER — Encounter (HOSPITAL_COMMUNITY): Payer: Self-pay | Admitting: Internal Medicine

## 2020-01-15 ENCOUNTER — Encounter (HOSPITAL_COMMUNITY)
Admission: EM | Disposition: A | Discharge status: Home / Self Care | Payer: Self-pay | Source: Ambulatory Visit | Attending: Internal Medicine

## 2020-01-15 HISTORY — PX: CYSTOSCOPY W/ URETERAL STENT PLACEMENT: SHX1429

## 2020-01-15 LAB — CBC
HCT: 24.9 % — ABNORMAL LOW (ref 39.0–52.0)
Hemoglobin: 8.3 g/dL — ABNORMAL LOW (ref 13.0–17.0)
MCH: 31.3 pg (ref 26.0–34.0)
MCHC: 33.3 g/dL (ref 30.0–36.0)
MCV: 94 fL (ref 80.0–100.0)
Platelets: 363 10*3/uL (ref 150–400)
RBC: 2.65 MIL/uL — ABNORMAL LOW (ref 4.22–5.81)
RDW: 12.9 % (ref 11.5–15.5)
WBC: 7 10*3/uL (ref 4.0–10.5)
nRBC: 0 % (ref 0.0–0.2)

## 2020-01-15 LAB — URINE CULTURE: Culture: NO GROWTH

## 2020-01-15 LAB — GLUCOSE, CAPILLARY
Glucose-Capillary: 85 mg/dL (ref 70–99)
Glucose-Capillary: 97 mg/dL (ref 70–99)

## 2020-01-15 LAB — RENAL FUNCTION PANEL
Albumin: 3.3 g/dL — ABNORMAL LOW (ref 3.5–5.0)
Anion gap: 4 — ABNORMAL LOW (ref 5–15)
BUN: 49 mg/dL — ABNORMAL HIGH (ref 8–23)
CO2: 18 mmol/L — ABNORMAL LOW (ref 22–32)
Calcium: 8.3 mg/dL — ABNORMAL LOW (ref 8.9–10.3)
Chloride: 109 mmol/L (ref 98–111)
Creatinine, Ser: 2.63 mg/dL — ABNORMAL HIGH (ref 0.61–1.24)
GFR calc non Af Amer: 23 mL/min — ABNORMAL LOW (ref >60)
Glucose, Bld: 157 mg/dL — ABNORMAL HIGH (ref 70–99)
Phosphorus: 4.3 mg/dL (ref 2.5–4.6)
Potassium: 6.2 mmol/L — ABNORMAL HIGH (ref 3.5–5.1)
Sodium: 131 mmol/L — ABNORMAL LOW (ref 135–145)

## 2020-01-15 LAB — CREATININE, URINE, RANDOM: Creatinine, Urine: 119.16 mg/dL

## 2020-01-15 LAB — CBG MONITORING, ED
Glucose-Capillary: 137 mg/dL — ABNORMAL HIGH (ref 70–99)
Glucose-Capillary: 132 mg/dL — ABNORMAL HIGH (ref 70–99)

## 2020-01-15 LAB — SODIUM, URINE, RANDOM: Sodium, Ur: 121 mmol/L

## 2020-01-15 SURGERY — CYSTOSCOPY, WITH RETROGRADE PYELOGRAM AND URETERAL STENT INSERTION
Anesthesia: General | Laterality: Bilateral

## 2020-01-15 MED ORDER — FENTANYL CITRATE (PF) 100 MCG/2ML IJ SOLN
INTRAMUSCULAR | Status: DC | PRN
  Administered 2020-01-15 (×2): 50 ug via INTRAVENOUS

## 2020-01-15 MED ORDER — ONDANSETRON HCL 4 MG/2ML IJ SOLN
INTRAMUSCULAR | Status: DC | PRN
  Administered 2020-01-15: 4 mg via INTRAVENOUS

## 2020-01-15 MED ORDER — PHENYLEPHRINE HCL (PRESSORS) 10 MG/ML IV SOLN
INTRAVENOUS | Status: AC
  Filled 2020-01-15: qty 1

## 2020-01-15 MED ORDER — FENTANYL CITRATE (PF) 100 MCG/2ML IJ SOLN
INTRAMUSCULAR | Status: AC
  Filled 2020-01-15: qty 2

## 2020-01-15 MED ORDER — FENTANYL CITRATE (PF) 100 MCG/2ML IJ SOLN: 25.0000 ug | INTRAMUSCULAR | Status: DC | PRN

## 2020-01-15 MED ORDER — PROPOFOL 10 MG/ML IV BOLUS
INTRAVENOUS | Status: AC
  Filled 2020-01-15: qty 20

## 2020-01-15 MED ORDER — DEXTROSE 50 % IV SOLN
1.0000 | Freq: Once | INTRAVENOUS | Status: AC
  Administered 2020-01-15: 50 mL via INTRAVENOUS
  Filled 2020-01-15: qty 50

## 2020-01-15 MED ORDER — IOHEXOL 300 MG/ML  SOLN
INTRAMUSCULAR | Status: DC | PRN
  Administered 2020-01-15: 15 mL via URETHRAL

## 2020-01-15 MED ORDER — SUGAMMADEX SODIUM 500 MG/5ML IV SOLN
INTRAVENOUS | Status: DC | PRN
  Administered 2020-01-15: 400 mg via INTRAVENOUS
  Administered 2020-01-15: 300 mg via INTRAVENOUS

## 2020-01-15 MED ORDER — CHLORHEXIDINE GLUCONATE CLOTH 2 % EX PADS
6.0000 | MEDICATED_PAD | Freq: Every day | CUTANEOUS | Status: DC
  Administered 2020-01-15 – 2020-01-17 (×3): 6 via TOPICAL

## 2020-01-15 MED ORDER — DEXAMETHASONE SODIUM PHOSPHATE 10 MG/ML IJ SOLN
INTRAMUSCULAR | Status: DC | PRN
  Administered 2020-01-15: 5 mg via INTRAVENOUS

## 2020-01-15 MED ORDER — ONDANSETRON HCL 4 MG/2ML IJ SOLN
INTRAMUSCULAR | Status: AC
  Filled 2020-01-15: qty 2

## 2020-01-15 MED ORDER — INSULIN ASPART 100 UNIT/ML ~~LOC~~ SOLN
5.0000 [IU] | Freq: Once | SUBCUTANEOUS | Status: AC
  Administered 2020-01-15: 5 [IU] via INTRAVENOUS
  Filled 2020-01-15: qty 0.05

## 2020-01-15 MED ORDER — CEFAZOLIN SODIUM-DEXTROSE 2-4 GM/100ML-% IV SOLN
INTRAVENOUS | Status: AC
  Filled 2020-01-15: qty 100

## 2020-01-15 MED ORDER — CEFAZOLIN SODIUM-DEXTROSE 2-3 GM-%(50ML) IV SOLR
INTRAVENOUS | Status: DC | PRN
  Administered 2020-01-15: 2 g via INTRAVENOUS

## 2020-01-15 MED ORDER — SODIUM CHLORIDE 0.9 % IV SOLN: INTRAVENOUS | Status: DC | PRN

## 2020-01-15 MED ORDER — PHENOL 1.4 % MT LIQD: 1.0000 | OROMUCOSAL | Status: DC | PRN

## 2020-01-15 MED ORDER — SODIUM CHLORIDE 0.9 % IR SOLN
Status: DC | PRN
  Administered 2020-01-15: 3000 mL via INTRAVESICAL

## 2020-01-15 MED ORDER — LIDOCAINE 2% (20 MG/ML) 5 ML SYRINGE
INTRAMUSCULAR | Status: AC
  Filled 2020-01-15: qty 5

## 2020-01-15 MED ORDER — LACTATED RINGERS IV SOLN: INTRAVENOUS | Status: DC

## 2020-01-15 MED ORDER — LIDOCAINE 2% (20 MG/ML) 5 ML SYRINGE
INTRAMUSCULAR | Status: DC | PRN
  Administered 2020-01-15: 40 mg via INTRAVENOUS

## 2020-01-15 MED ORDER — ROCURONIUM BROMIDE 10 MG/ML (PF) SYRINGE
PREFILLED_SYRINGE | INTRAVENOUS | Status: DC | PRN
  Administered 2020-01-15: 70 mg via INTRAVENOUS

## 2020-01-15 MED ORDER — DEXAMETHASONE SODIUM PHOSPHATE 10 MG/ML IJ SOLN
INTRAMUSCULAR | Status: AC
  Filled 2020-01-15: qty 1

## 2020-01-15 MED ORDER — PROPOFOL 10 MG/ML IV BOLUS
INTRAVENOUS | Status: DC | PRN
  Administered 2020-01-15: 30 mg via INTRAVENOUS
  Administered 2020-01-15: 140 mg via INTRAVENOUS
  Administered 2020-01-15: 30 mg via INTRAVENOUS

## 2020-01-15 SURGICAL SUPPLY — 15 items
BAG URO CATCHER STRL LF (MISCELLANEOUS) ×3 IMPLANT
BASKET ZERO TIP NITINOL 2.4FR (BASKET) IMPLANT
CATH INTERMIT  6FR 70CM (CATHETERS) ×3 IMPLANT
CLOTH BEACON ORANGE TIMEOUT ST (SAFETY) ×3 IMPLANT
GLOVE BIOGEL M STRL SZ7.5 (GLOVE) ×9 IMPLANT
GOWN STRL REUS W/TWL LRG LVL3 (GOWN DISPOSABLE) ×6 IMPLANT
GUIDEWIRE ANG ZIPWIRE 038X150 (WIRE) ×3 IMPLANT
GUIDEWIRE STR DUAL SENSOR (WIRE) IMPLANT
KIT TURNOVER KIT A (KITS) ×3 IMPLANT
MANIFOLD NEPTUNE II (INSTRUMENTS) ×3 IMPLANT
PACK CYSTO (CUSTOM PROCEDURE TRAY) ×3 IMPLANT
STENT POLARIS 5FRX26 (STENTS) ×6 IMPLANT
TUBING CONNECTING 10 (TUBING) ×2 IMPLANT
TUBING CONNECTING 10' (TUBING) ×1
TUBING UROLOGY SET (TUBING) IMPLANT

## 2020-01-15 NOTE — Op Note (Signed)
NAME: [name], [name] MEDICAL RECORD [id number] ACCOUNT [id number] DATE OF BIRTH:[birth year] FACILITY: [facility] LOCATION: [facility] PHYSICIAN:Tauriel Scronce Tresa Moore, MD  OPERATIVE REPORT  DATE OF PROCEDURE:  01/15/2020  SURGEON:  Alexis Frock, MD  PREOPERATIVE DIAGNOSES:   1.  Left ureteral stone.   2.  Hyperkalemia with acute renal failure.  PROCEDURE: 1.  Cystoscopy, bilateral retrograde pyelograms, interpretation. 2.  Insertion of bilateral ureteral stents, 5 x 26 Polaris, no tether.  ESTIMATED BLOOD LOSS:  Nil.  COMPLICATIONS:  None.  SPECIMENS:  None.  FINDINGS: 1.  Moderate left hydronephrosis with left distal ureteral stone. 2.  Successful placement of left ureteral stent, proximal end in the renal pelvis, distal end in urinary bladder with copious urine flow after stenting. 3.  Successful placement of right ureteral stent, proximal end in renal pelvis, distal end in urinary bladder. 4.  Unremarkable retrograde pyelogram.  INDICATIONS:  The patient is a pleasant 74 year old man with a remote history of urolithiasis years ago.  He was found on workup of malaise, fatigue and acute renal failure to have significant hyperkalemia greater than 6, as well as a left distal  ureteral stone that was essentially asymptomatic.  He did have some significant hydronephrosis on the left side too, as well as bilateral intrarenal stones.  He initially underwent a trial of aggressive medical management with insulin, glucose and  Kayexalate to lower his potassium in preparation for renal decompression and this was marginally successful.  He was reevaluated today.  Potassium was only mildly trending down and it was clearly felt that continued path of renal decompression would be  warranted to correct the underlying cause with stenting being preferred, addressing stones in a staged fashion ____ some renal recovery and improvement of his metabolic derangement and he wished to proceed.  Informed consent  was obtained and placed in  the medical record.  DESCRIPTION OF PROCEDURE:  The patient being identified, the procedure being bilateral ureteral stent placement was confirmed.  Procedure timeout was performed.  Intravenous antibiotics administered.  General anesthesia induced.  The patient was placed  into a low lithotomy position.  A sterile field was created, prepped and draped base of the penis, perineum and proximal thighs using iodine.  Cystourethroscopy was performed using 21-French rigid cystoscope with offset lens.  Inspection of the anterior  and posterior urethra unremarkable.  Inspection of bladder revealed no diverticula, calcifications, papillary lesions.  There was some pallor of the prostatic urethra and bladder neck, consistent with prior radiation.  There was mild trabeculation.  The  left ureteral orifice was cannulated with a 6-French renal catheter and left retrograde pyelogram was obtained.  Left retrograde pyelogram demonstrated a single left ureter, single system left kidney.  There was a filling defect in distal ureter consistent with known stone.  There was moderate hydronephrosis above this.  A 0.03 ZIPwire was advanced to lower pole  and set aside, over which a new 5 x 26 Polaris-type stent was placed using fluoroscopic guidance.  Good proximal and distal planes were noted.  Repeat cystoscopic exam confirmed good placement and there was efflux of copious hydronephrotic drip  of urine  around and through the distal end of the stent.  This was quite proteinaceous appearing.  Next, the right ureteral orifice was cannulated with a 6-French renal catheter and right retrograde pyelogram was obtained.  Right retrograde pyelogram demonstrated a single right ureter, single system right kidney.  No filling defects, narrowing or hydronephrosis noted.  The ZIPwire was once again advanced to  the upper pole and a separate 5 x 26 Polaris-type stent was placed  using fluoroscopic guidance.   Good proximal and distal planes were noted.  Given his acute renal failure with persistent hyperkalemia and to ensure maximal renal drainage, a 16-French Foley catheter was placed free to straight drain, 10 mL around the  balloon and the procedure was terminated.  The patient tolerated the procedure well.  No immediate periprocedural complications.  The patient was taken to postanesthesia care unit in stable condition with plan for continued inpatient admission and  metabolic monitoring.  VN/NUANCE  D:01/15/2020 T:01/15/2020 JOB:[id number]

## 2020-01-15 NOTE — Transfer of Care (Signed)
Immediate Anesthesia Transfer of Care Note  Patient: CAID RADIN  Procedure(s) Performed: CYSTOSCOPY WITH RETROGRADE PYELOGRAM/URETERAL STENT PLACEMENT (Bilateral )  Patient Location: PACU  Anesthesia Type:General  Level of Consciousness: awake and alert   Airway & Oxygen Therapy: Patient Spontanous Breathing and Patient connected to face mask oxygen  Post-op Assessment: Report given to RN and Post -op Vital signs reviewed and stable  Post vital signs: Reviewed and stable  Last Vitals:  Vitals Value Taken Time  BP 147/100 01/15/20 1700  Temp    Pulse 70 01/15/20 1705  Resp 19 01/15/20 1705  SpO2 100 % 01/15/20 1705  Vitals shown include unvalidated device data.  Last Pain:  Vitals:   01/14/20 0913  TempSrc: Oral  PainSc:          Complications: No complications documented.

## 2020-01-15 NOTE — Progress Notes (Signed)
[facility] Progress Note  Subjective: seen in preop, no c/o. 800 cc uop recorded yest. 1.2 L in. No sob, abd pain or CP.   Vitals:   01/15/20 1007 01/15/20 1324 01/15/20 1400 01/15/20 1506  BP: (!) 161/58 (!) 146/75 (!) 154/65 (!) 159/84  Pulse: 67 67 (!) 59 72  Resp: '18 18  16  ' Temp:      TempSrc:      SpO2: 99% 99% 100% 100%  Weight:      Height:        Exam: Gen WD WN in no distress No rash, cyanosis or gangrene, dry skin in LE's Sclera anicteric, throat clear No jvd or bruits Chest clear bilat to bases no rales or wheezin RRR no MRG, distant HS Abd soft ntnd no mass or ascites +bs GU normal male MS no joint effusions or deformity Ext no leg or UE edema, no wounds or ulcers Neuro is alert, Ox 3 , nf, no asterixis      Date                Creat               eGFR  2010- 2013     0.74- 0.97  11/2012            1.71  2015               1.73  2016               1.17                 >60  2017               1.09  2018               1.08  10/2018            1.33                   01/2019          1.30  10/01/19           1.5                   54  10/24/19           1.62                 48  12/06/19           2.02                 37  9/27/ 21          3.11                 37  01/13/20         3.31                 22  10/05 /21        3.21                 20   Home meds:  - norvasc 10/ asa 81/ lipitor 40/ coreg 25 bid/ hctz 25 qd/ losartan 100 qd/ aldactone 25 qd  - januvia 50 qd/ glimepiride 4 am  - neurontin 300 tid/ prilosec 40 qd  - prn's/ vitamins/ supplements   UA 10/5 - negative   Abd CT 01/14/20 - The left kidney demonstrates moderate to marked hydroureteronephrosis  down to an obstructing 7.5 mm calculus in the distal left ureter along the upper margin of the left acetabulum. No right-sided ureteral calculi or hydronephrosis. No obvious bladder calculi or mass without contrast. The bladder is moderately obscured by artifact from bilateral hip  prostheses.  Assessment/ Plan: 1. AoCKD 3a - b/l creat 1.3- 1.6, eGFR 48- 16m/min.  Creat worsening over last few mos to 2.0 in Aug , 3.1 in Sept, up to 3.3 on admit here. UA negative. CT > severe L hydro w/ nonobstructing 524mstones in bilat lower poles. Both kidneys by CT are somewhat atrophied. Creat here 3.3 > 2.6 today, good UOP.  Pt is not uremic. Getting IVF"s and going to OR today for possible stenting.  Will follow.  2. Hyperkalemia - tid lokelma and renal diet, holding aldactone/ ARB appropriately 3. Hx prostate cancer - in 2018 rx w/ radiation 4. HTN - taking ARB, BB and norvasc at home. Holding losartan/ ARB for now, can use others as needed.  5. DM2 - per pmd       Rob Ilyaas Musto 01/15/2020, 4:03 PM   Recent Labs  Lab 01/14/20 2020 01/15/20 0448  K 5.5* 6.2*  BUN 50* 49*  CREATININE 2.85* 2.63*  CALCIUM 8.2* 8.3*  PHOS  --  4.3  HGB 8.7* 8.3*   Inpatient medications: . [MAR Hold] amLODipine  10 mg Oral Daily  . [MAR Hold] aspirin  81 mg Oral Daily  . [MAR Hold] atorvastatin  40 mg Oral Daily  . [MAR Hold] carvedilol  25 mg Oral BID WC  . [MAR Hold] ferrous sulfate  325 mg Oral Q breakfast  . [MAR Hold] heparin  5,000 Units Subcutaneous Q8H  . [MAR Hold] insulin aspart  0-9 Units Subcutaneous TID WC  . [MAR Hold] sodium chloride flush  3 mL Intravenous Q12H   . sodium chloride 75 mL/hr at 01/15/20 0930  . ceFAZolin    . lactated ringers Stopped (01/15/20 1531)   [MAR Hold] acetaminophen **OR** [MAR Hold] acetaminophen, [MAR Hold] docusate sodium, [MAR Hold] hydrALAZINE, [MAR Hold] HYDROcodone-acetaminophen, [MAR Hold]  HYDROmorphone (DILAUDID) injection, [MAR Hold] polyethylene glycol, sodium chloride irrigation

## 2020-01-15 NOTE — Brief Op Note (Signed)
01/15/2020  4:37 PM  PATIENT:  [name]  74 y.o. male  PRE-OPERATIVE DIAGNOSIS:  bilateral renal stones and renal failure  POST-OPERATIVE DIAGNOSIS:  bilateral renal stones and renal failure  PROCEDURE:  Procedure(s): CYSTOSCOPY WITH RETROGRADE PYELOGRAM/URETERAL STENT PLACEMENT (Bilateral)  SURGEON:  Surgeon(s) and Role:    * Alexis Frock, MD - Primary  PHYSICIAN ASSISTANT:   ASSISTANTS: none   ANESTHESIA:   general  EBL:  0 mL   BLOOD ADMINISTERED:none  DRAINS: foley to gravity   LOCAL MEDICATIONS USED:  NONE  SPECIMEN:  No Specimen  DISPOSITION OF SPECIMEN:  N/A  COUNTS:  YES  TOURNIQUET:  * No tourniquets in log *  DICTATION: .Other Dictation: Dictation Number [id number]  PLAN OF CARE: Admit to inpatient   PATIENT DISPOSITION:  PACU - hemodynamically stable.   Delay start of Pharmacological VTE agent (>24hrs) due to surgical blood loss or risk of bleeding: not applicable

## 2020-01-15 NOTE — ED Notes (Signed)
Placed on hospital bed

## 2020-01-15 NOTE — Anesthesia Preprocedure Evaluation (Addendum)
Anesthesia Evaluation  Patient identified by MRN, date of birth, ID band Patient awake    Reviewed: Allergy & Precautions, NPO status , Patient's Chart, lab work & pertinent test results  Airway Mallampati: I  TM Distance: >3 FB Neck ROM: Full    Dental  (+) Edentulous Upper, Partial Lower   Pulmonary asthma , former smoker,     + decreased breath sounds      Cardiovascular hypertension, Pt. on medications and Pt. on home beta blockers + CAD   Rhythm:Regular Rate:Normal     Neuro/Psych negative psych ROS   GI/Hepatic Neg liver ROS, GERD  Medicated,  Endo/Other  diabetes, Type 2, Oral Hypoglycemic Agents  Renal/GU CRFRenal disease     Musculoskeletal   Abdominal (+) + obese,   Peds  Hematology   Anesthesia Other Findings   Reproductive/Obstetrics                            Anesthesia Physical Anesthesia Plan  ASA: III and emergent  Anesthesia Plan: General   Post-op Pain Management:    Induction: Intravenous, Rapid sequence and Cricoid pressure planned  PONV Risk Score and Plan: 3 and Ondansetron and Dexamethasone  Airway Management Planned: Oral ETT  Additional Equipment: None  Intra-op Plan:   Post-operative Plan: Extubation in OR  Informed Consent: I have reviewed the patients History and Physical, chart, labs and discussed the procedure including the risks, benefits and alternatives for the proposed anesthesia with the patient or authorized representative who has indicated his/her understanding and acceptance.     Dental advisory given  Plan Discussed with: CRNA  Anesthesia Plan Comments:       Anesthesia Quick Evaluation

## 2020-01-15 NOTE — Anesthesia Procedure Notes (Signed)
Procedure Name: Intubation Date/Time: 01/15/2020 4:18 PM Performed by: Cynda Familia, CRNA Pre-anesthesia Checklist: Patient identified, Emergency Drugs available, Suction available and Patient being monitored Patient Re-evaluated:Patient Re-evaluated prior to induction Oxygen Delivery Method: Circle System Utilized Preoxygenation: Pre-oxygenation with 100% oxygen Induction Type: IV induction Ventilation: Mask ventilation without difficulty Laryngoscope Size: Miller and 2 Grade View: Grade I Tube type: Oral Tube size: 7.5 mm Number of attempts: 1 Airway Equipment and Method: Stylet Placement Confirmation: ETT inserted through vocal cords under direct vision,  positive ETCO2 and breath sounds checked- equal and bilateral Secured at: 22 cm Tube secured with: Tape Dental Injury: Teeth and Oropharynx as per pre-operative assessment  Comments: Smooth induction Hollis-- intubation AM CRNA atraumatic  - pt with no teeth on top and 2 on bottom-- unchanged with laryngoscopy-- bilat BS

## 2020-01-15 NOTE — Progress Notes (Signed)
Subjective/Chief Complaint:  1 - Left Distal Ureteral Stone - 59mm left distal stone with moderate hydro and bilateral lower pole about 6mm non-obstructing stones by ER CT on eval acute renal failure. H/o one prior episde years ago manged by ureteroscopy by [facility].  2 - Acute Renal Failure with Hyperkalemia - Cr 3's with K 6.4 by ER labs 10/5. Baseline Cr <1.5 x several. Given kayexalate and havig copious BM's.  Holidng ARB and aldactone.   3 -  Moderate Risk Prostate Cancer- s/p radiation 2018 for moderate risk cancer. PSA most recently 0.2.   PMH sig for DM2 (a1c 7s), HTN.   Today " [name] " is stable K stil high but trending down some. GFR slightly improved with hydration.    Objective: Vital signs in last 24 hours: Temp:  [98.2 F (36.8 C)-98.3 F (36.8 C)] 98.2 F (36.8 C) (10/05 0913) Pulse Rate:  [59-85] 65 (10/06 0553) Resp:  [12-22] 15 (10/06 0553) BP: (138-182)/(61-135) 153/74 (10/06 0553) SpO2:  [95 %-100 %] 96 % (10/06 0553) Weight:  [108.9 kg] 108.9 kg (10/05 0844)    Intake/Output from previous day: 10/05 0701 - 10/06 0700 In: 44.8 [IV Piggyback:44.8] Out: -  Intake/Output this shift: No intake/output data recorded.  Constitutional:      Comments: NAD, very pleasant  HENT:     Head: Normocephalic.     Nose: Nose normal.  Eyes:     Pupils: Pupils are equal, round, and reactive to light.  Cardiovascular:     Rate and Rhythm: Normal rate.  Pulmonary:     Effort: Pulmonary effort is normal.  Abdominal:     Comments: Moderate truncal obesity.   Genitourinary:    Comments: No CVAT Neurological:     General: No focal deficit present.     Mental Status: He is alert.  Psychiatric:        Mood and Affect: Mood normal.    Lab Results:  Recent Labs    01/14/20 2020 01/15/20 0448  WBC 7.8 7.0  HGB 8.7* 8.3*  HCT 26.0* 24.9*  PLT 379 363   BMET Recent Labs    01/14/20 2020 01/15/20 0448  NA 132* 131*  K 5.5* 6.2*  CL 110 109  CO2 13*  18*  GLUCOSE 171* 157*  BUN 50* 49*  CREATININE 2.85* 2.63*  CALCIUM 8.2* 8.3*   PT/INR No results for input(s): LABPROT, INR in the last 72 hours. ABG No results for input(s): PHART, HCO3 in the last 72 hours.  Invalid input(s): PCO2, PO2  Studies/Results: CT ABDOMEN PELVIS WO CONTRAST  Result Date: 01/14/2020 CLINICAL DATA:  Abdominal distension.  Elevated BUN and creatinine. EXAM: CT ABDOMEN AND PELVIS WITHOUT CONTRAST TECHNIQUE: Multidetector CT imaging of the abdomen and pelvis was performed following the standard protocol without IV contrast. COMPARISON:  07/25/2013 FINDINGS: Lower chest: The lung bases are clear of an acute process. No pleural effusion or pulmonary lesions. The heart is within normal limits in size for age. No pericardial effusion. Scattered aortic and coronary artery calcifications are noted. The distal esophagus is grossly normal. Hepatobiliary: No hepatic lesions are identified without contrast. The gallbladder appears normal. No common bile duct dilatation. Pancreas: No mass, inflammation or ductal dilatation. Spleen: Normal size. There is a stable low-attenuation lesion consistent with a benign cyst. Adrenals/Urinary Tract: The adrenal glands are unremarkable. Bilateral renal calculi are noted. The left kidney demonstrates moderate to marked hydroureteronephrosis down to an obstructing 7.5 mm calculus in the distal left ureter  along the upper margin of the left acetabulum. No right-sided ureteral calculi or hydronephrosis. No obvious bladder calculi or mass without contrast. The bladder is moderately obscured by artifact from bilateral hip prostheses. Stomach/Bowel: The stomach, duodenum, small bowel and colon are unremarkable. No acute inflammatory changes, mass lesions or obstructive findings. Vascular/Lymphatic: Advanced vascular calcifications but no aneurysm. No mesenteric or retroperitoneal mass or adenopathy. Reproductive: The prostate gland and seminal vesicles  are grossly normal. There appear to be scattered brachytherapy seeds in the prostate gland. Other: Left inguinal hernia containing fat is noted. No inguinal adenopathy. Small periumbilical abdominal wall hernia containing fat. Musculoskeletal: No significant bony findings. IMPRESSION: 1. 7.5 mm distal left ureteral calculus causing moderate to marked left-sided hydroureteronephrosis. 2. Bilateral renal calculi. 3. No other significant abdominal/pelvic findings, mass lesions or adenopathy. 4. Advanced vascular calcifications. 5. Aortic atherosclerosis. Aortic Atherosclerosis (ICD10-I70.0). Electronically Signed   By: Marijo Sanes M.D.   On: 01/14/2020 15:37   US RENAL  Result Date: 01/14/2020 CLINICAL DATA:  Initial evaluation for acute on chronic renal failure. EXAM: RENAL / URINARY TRACT ULTRASOUND COMPLETE COMPARISON:  Prior CT from 01/14/2020. FINDINGS: Right Kidney: Renal measurements: 11.0 x 6.4 x 6.4 cm = volume: 232.1 mL. Increased echogenicity seen within the renal parenchyma. No visible nephrolithiasis evident by sonography. No hydronephrosis. No focal renal mass. Left Kidney: Renal measurements: 13.4 x 6.3 x 7.3 cm = volume: 321.3 mL. Mildly increased echogenicity seen within the renal parenchyma. 6 mm nonobstructive calculus present at the lower pole. Moderate hydronephrosis, corresponding with abnormality seen on prior CT. No focal renal mass. Bladder: Appears normal for degree of bladder distention. Neither ureteral jet visualized. Other: None. IMPRESSION: 1. Moderate left-sided hydronephrosis, corresponding with abnormality seen on prior CT. 2. Additional 6 mm nonobstructive left renal nephrolithiasis. 3. Mildly increased echogenicity within the renal parenchyma, suggesting underlying medical renal disease. Electronically Signed   By: Jeannine Boga M.D.   On: 01/14/2020 18:42    Anti-infectives: Anti-infectives (From admission, onward)   None      Assessment/Plan:  1 - Left  Distal Ureteral Stone - likely part of factor driving ARF. Proceed as planned today with BILATERAL stent placement, then likely bilateral URS in elective setting once over metabolic derangement. He understands there is some risk of surgery with his hyperkalemia, though, the goal is maneuvers to correct underlying problem. This is certainly urgent / necessary.   2 - Acute Renal Failure with Hyperkalemia - from unilateral obstruction + poor PO intake. Appreciate hospitalist and nephrology comanagement. Plan for renal decompression as per above.   3 -  Moderate Risk Prostate Cancer- great biochemical control.   Alexis Frock 01/15/2020

## 2020-01-15 NOTE — Progress Notes (Signed)
PROGRESS NOTE    [name]  QGB:[id number] DOB: May 08, 1945 DOA: 01/14/2020 PCP: Luetta Nutting, DO     Brief Narrative:  [name] is a 74 year old male with past medical history of hypertension, diabetes, CAD, MI who presented to the ED with abnormal lab work.  He had lab work done yesterday by his PCP to follow-up on his renal function.  Upon chart review the patient had elevated creatinine to 2 in August which was thought to be secondary to a recent GI illness and dehydration however repeat blood work since that time showed an increased creatinine of 3.  He was told to immediately come to the ER due to elevation in potassium.  He has been feeling bloated over the past several weeks and constipated for 2 weeks.  Per ED provider he was given a new medication for leg pain but does not know the name of it and has not been taking it recently.  No shortness of breath, chest pain, vomiting, hemoptysis.  No other issues.  CT abdomen pelvis revealed 7.5 mm distal left ureteral calculus causing moderate to marked left-sided hydronephrosis with bilateral renal calculi and advanced vascular calcifications.  Patient was treated for hyperkalemia.  Urology and nephrology were consulted.  New events last 24 hours / Subjective: Patient seen in the emergency department.  He had no complaints on examination.  Awaiting OR plan for this afternoon for bilateral stent placement.  Assessment & Plan:   Principal Problem:   Hyperkalemia Active Problems:   Type 2 diabetes mellitus with hypoglycemia without coma (HCC)   Essential hypertension   CAD (coronary artery disease)   Hyperlipidemia with target LDL less than 70   AKI (acute kidney injury) ([place])   Hydroureteronephrosis   AKI on CKD stage III -Baseline creatinine 1.3-1.6 -Secondary to severe left hydroureteronephrosis -Nephrology following -IV fluid  Hyperkalemia -Patient was given NovoLog/dextrose, Lokelma, calcium gluconate -Continue  to hold spironolactone, losartan -Nephrology following  Left ureteral calculus with moderate to marked left hydroureteronephrosis -Urology following, planning for OR this afternoon  Diabetes mellitus type 2 -Continue to hold home glimepiride, sitagliptin -Sliding scale insulin  Essential hypertension -Continue amlodipine, Coreg  Hyperlipidemia -Continue Lipitor  CAD -Continue aspirin, Coreg   DVT prophylaxis:  heparin injection 5,000 Units Start: 01/14/20 2200  Code Status: DNI Family Communication: None at bedside Disposition Plan:  Status is: Inpatient  Remains inpatient appropriate because:Persistent severe electrolyte disturbances and Ongoing diagnostic testing needed not appropriate for outpatient work up   Dispo: The patient is from: Home              Anticipated d/c is to: Home              Anticipated d/c date is: 2 days              Patient currently is not medically stable to d/c.  Undergoing bilateral stent placement in the OR this afternoon   Consultants:   Nephrology  Urology   Antimicrobials:  Anti-infectives (From admission, onward)   Start     Dose/Rate Route Frequency Ordered Stop   01/15/20 1448  ceFAZolin (ANCEF) 2-4 GM/100ML-% IVPB       Note to Pharmacy: Charmayne Sheer   : cabinet override      01/15/20 1448 01/16/20 0259        Objective: Vitals:   01/15/20 1007 01/15/20 1324 01/15/20 1400 01/15/20 1506  BP: (!) 161/58 (!) 146/75 (!) 154/65 (!) 159/84  Pulse: 67 67 (!)  59 72  Resp: 18 18  16   Temp:      TempSrc:      SpO2: 99% 99% 100% 100%  Weight:      Height:        Intake/Output Summary (Last 24 hours) at 01/15/2020 1537 Last data filed at 01/15/2020 0930 Gross per 24 hour  Intake 1145.31 ml  Output 800 ml  Net 345.31 ml   Filed Weights   01/14/20 0844  Weight: 108.9 kg    Examination:  General exam: Appears calm and comfortable  Respiratory system: Clear to auscultation. Respiratory effort normal. No  respiratory distress. No conversational dyspnea.  Cardiovascular system: S1 & S2 heard, RRR. No murmurs. No pedal edema. Gastrointestinal system: Abdomen is mildly distended, soft and nontender. Normal bowel sounds heard. Central nervous system: Alert and oriented. No focal neurological deficits. Speech clear.  Extremities: Symmetric in appearance  Skin: No rashes, lesions or ulcers on exposed skin  Psychiatry: Judgement and insight appear normal. Mood & affect appropriate.   Data Reviewed: I have personally reviewed following labs and imaging studies  CBC: Recent Labs  Lab 01/14/20 1027 01/14/20 2020 01/15/20 0448  WBC 9.4 7.8 7.0  NEUTROABS 7.3  --   --   HGB 9.6* 8.7* 8.3*  HCT 28.6* 26.0* 24.9*  MCV 94.4 93.9 94.0  PLT 453* 379 867   Basic Metabolic Panel: Recent Labs  Lab 01/13/20 0716 01/14/20 1027 01/14/20 2020 01/15/20 0448  NA 136 139 132* 131*  K 6.4* 6.3* 5.5* 6.2*  CL 110 111 110 109  CO2 19* 16* 13* 18*  GLUCOSE 58* 61* 171* 157*  BUN 52* 55* 50* 49*  CREATININE 3.31* 3.20* 2.85* 2.63*  CALCIUM 8.9 8.8* 8.2* 8.3*  PHOS  --   --   --  4.3   GFR: Estimated Creatinine Clearance: 31.9 mL/min (A) (by C-G formula based on SCr of 2.63 mg/dL (H)). Liver Function Tests: Recent Labs  Lab 01/14/20 1027 01/15/20 0448  AST 10*  --   ALT 12  --   ALKPHOS 70  --   BILITOT 0.6  --   PROT 8.9*  --   ALBUMIN 4.0 3.3*   No results for input(s): LIPASE, AMYLASE in the last 168 hours. No results for input(s): AMMONIA in the last 168 hours. Coagulation Profile: No results for input(s): INR, PROTIME in the last 168 hours. Cardiac Enzymes: No results for input(s): CKTOTAL, CKMB, CKMBINDEX, TROPONINI in the last 168 hours. BNP (last 3 results) No results for input(s): PROBNP in the last 8760 hours. HbA1C: No results for input(s): HGBA1C in the last 72 hours. CBG: Recent Labs  Lab 01/14/20 1556 01/14/20 1656 01/15/20 0748 01/15/20 1239 01/15/20 1507  GLUCAP  52* 162* 137* 132* 85   Lipid Profile: No results for input(s): CHOL, HDL, LDLCALC, TRIG, CHOLHDL, LDLDIRECT in the last 72 hours. Thyroid Function Tests: No results for input(s): TSH, T4TOTAL, FREET4, T3FREE, THYROIDAB in the last 72 hours. Anemia Panel: No results for input(s): VITAMINB12, FOLATE, FERRITIN, TIBC, IRON, RETICCTPCT in the last 72 hours. Sepsis Labs: No results for input(s): PROCALCITON, LATICACIDVEN in the last 168 hours.  Recent Results (from the past 240 hour(s))  Respiratory Panel by RT PCR (Flu A&B, Covid) - Nasopharyngeal Swab     Status: None   Collection Time: 01/14/20 11:10 AM   Specimen: Nasopharyngeal Swab  Result Value Ref Range Status   SARS Coronavirus 2 by RT PCR NEGATIVE NEGATIVE Final    Comment: (NOTE) SARS-CoV-2  target nucleic acids are NOT DETECTED.  The SARS-CoV-2 RNA is generally detectable in upper respiratoy specimens during the acute phase of infection. The lowest concentration of SARS-CoV-2 viral copies this assay can detect is 131 copies/mL. A negative result does not preclude SARS-Cov-2 infection and should not be used as the sole basis for treatment or other patient management decisions. A negative result may occur with  improper specimen collection/handling, submission of specimen other than nasopharyngeal swab, presence of viral mutation(s) within the areas targeted by this assay, and inadequate number of viral copies (<131 copies/mL). A negative result must be combined with clinical observations, patient history, and epidemiological information. The expected result is Negative.  Fact Sheet for Patients:  PinkCheek.be  Fact Sheet for Healthcare Providers:  GravelBags.it  This test is no t yet approved or cleared by the [place] FDA and  has been authorized for detection and/or diagnosis of SARS-CoV-2 by FDA under an Emergency Use Authorization (EUA). This EUA will  remain  in effect (meaning this test can be used) for the duration of the COVID-19 declaration under Section 564(b)(1) of the Act, 21 U.S.C. section 360bbb-3(b)(1), unless the authorization is terminated or revoked sooner.     Influenza A by PCR NEGATIVE NEGATIVE Final   Influenza B by PCR NEGATIVE NEGATIVE Final    Comment: (NOTE) The Xpert Xpress SARS-CoV-2/FLU/RSV assay is intended as an aid in  the diagnosis of influenza from Nasopharyngeal swab specimens and  should not be used as a sole basis for treatment. Nasal washings and  aspirates are unacceptable for Xpert Xpress SARS-CoV-2/FLU/RSV  testing.  Fact Sheet for Patients: PinkCheek.be  Fact Sheet for Healthcare Providers: GravelBags.it  This test is not yet approved or cleared by the [place] FDA and  has been authorized for detection and/or diagnosis of SARS-CoV-2 by  FDA under an Emergency Use Authorization (EUA). This EUA will remain  in effect (meaning this test can be used) for the duration of the  Covid-19 declaration under Section 564(b)(1) of the Act, 21  U.S.C. section 360bbb-3(b)(1), unless the authorization is  terminated or revoked. Performed at [facility], [place] [address], [place], [place] [address]   Urine culture     Status: None   Collection Time: 01/14/20  3:44 PM   Specimen: Urine, Random  Result Value Ref Range Status   Specimen Description   Final    URINE, RANDOM Performed at [facility] [address], [place], [place] [address]    Special Requests   Final    NONE Performed at [facility], [place] [address], [place], [place] [address]    Culture   Final    NO GROWTH Performed at [facility], [place] [address] [place], [place] [address]    Report Status 01/15/2020 FINAL  Final      Radiology Studies: CT ABDOMEN PELVIS WO CONTRAST  Result Date:  01/14/2020 CLINICAL DATA:  Abdominal distension.  Elevated BUN and creatinine. EXAM: CT ABDOMEN AND PELVIS WITHOUT CONTRAST TECHNIQUE: Multidetector CT imaging of the abdomen and pelvis was performed following the standard protocol without IV contrast. COMPARISON:  07/25/2013 FINDINGS: Lower chest: The lung bases are clear of an acute process. No pleural effusion or pulmonary lesions. The heart is within normal limits in size for age. No pericardial effusion. Scattered aortic and coronary artery calcifications are noted. The distal esophagus is grossly normal. Hepatobiliary: No hepatic lesions are identified without contrast. The gallbladder appears normal. No  common bile duct dilatation. Pancreas: No mass, inflammation or ductal dilatation. Spleen: Normal size. There is a stable low-attenuation lesion consistent with a benign cyst. Adrenals/Urinary Tract: The adrenal glands are unremarkable. Bilateral renal calculi are noted. The left kidney demonstrates moderate to marked hydroureteronephrosis down to an obstructing 7.5 mm calculus in the distal left ureter along the upper margin of the left acetabulum. No right-sided ureteral calculi or hydronephrosis. No obvious bladder calculi or mass without contrast. The bladder is moderately obscured by artifact from bilateral hip prostheses. Stomach/Bowel: The stomach, duodenum, small bowel and colon are unremarkable. No acute inflammatory changes, mass lesions or obstructive findings. Vascular/Lymphatic: Advanced vascular calcifications but no aneurysm. No mesenteric or retroperitoneal mass or adenopathy. Reproductive: The prostate gland and seminal vesicles are grossly normal. There appear to be scattered brachytherapy seeds in the prostate gland. Other: Left inguinal hernia containing fat is noted. No inguinal adenopathy. Small periumbilical abdominal wall hernia containing fat. Musculoskeletal: No significant bony findings. IMPRESSION: 1. 7.5 mm distal left ureteral  calculus causing moderate to marked left-sided hydroureteronephrosis. 2. Bilateral renal calculi. 3. No other significant abdominal/pelvic findings, mass lesions or adenopathy. 4. Advanced vascular calcifications. 5. Aortic atherosclerosis. Aortic Atherosclerosis (ICD10-I70.0). Electronically Signed   By: Marijo Sanes M.D.   On: 01/14/2020 15:37   US RENAL  Result Date: 01/14/2020 CLINICAL DATA:  Initial evaluation for acute on chronic renal failure. EXAM: RENAL / URINARY TRACT ULTRASOUND COMPLETE COMPARISON:  Prior CT from 01/14/2020. FINDINGS: Right Kidney: Renal measurements: 11.0 x 6.4 x 6.4 cm = volume: 232.1 mL. Increased echogenicity seen within the renal parenchyma. No visible nephrolithiasis evident by sonography. No hydronephrosis. No focal renal mass. Left Kidney: Renal measurements: 13.4 x 6.3 x 7.3 cm = volume: 321.3 mL. Mildly increased echogenicity seen within the renal parenchyma. 6 mm nonobstructive calculus present at the lower pole. Moderate hydronephrosis, corresponding with abnormality seen on prior CT. No focal renal mass. Bladder: Appears normal for degree of bladder distention. Neither ureteral jet visualized. Other: None. IMPRESSION: 1. Moderate left-sided hydronephrosis, corresponding with abnormality seen on prior CT. 2. Additional 6 mm nonobstructive left renal nephrolithiasis. 3. Mildly increased echogenicity within the renal parenchyma, suggesting underlying medical renal disease. Electronically Signed   By: Jeannine Boga M.D.   On: 01/14/2020 18:42      Scheduled Meds:  [MAR Hold] amLODipine  10 mg Oral Daily   [MAR Hold] aspirin  81 mg Oral Daily   [MAR Hold] atorvastatin  40 mg Oral Daily   [MAR Hold] carvedilol  25 mg Oral BID WC   [MAR Hold] ferrous sulfate  325 mg Oral Q breakfast   [MAR Hold] heparin  5,000 Units Subcutaneous Q8H   [MAR Hold] insulin aspart  0-9 Units Subcutaneous TID WC   [MAR Hold] sodium chloride flush  3 mL Intravenous Q12H    Continuous Infusions:  sodium chloride 75 mL/hr at 01/15/20 0930   ceFAZolin     lactated ringers 200 mL/hr at 01/15/20 1531     LOS: 1 day      Time spent: 40 minutes   Dessa Phi, DO [facility] 01/15/2020, 3:37 PM   Available via Epic secure chat 7am-7pm After these hours, please refer to coverage provider listed on [email or web address]

## 2020-01-15 NOTE — Anesthesia Procedure Notes (Signed)
Date/Time: 01/15/2020 4:50 PM Performed by: Cynda Familia, CRNA Oxygen Delivery Method: Simple face mask Placement Confirmation: breath sounds checked- equal and bilateral and positive ETCO2 Dental Injury: Teeth and Oropharynx as per pre-operative assessment

## 2020-01-16 ENCOUNTER — Other Ambulatory Visit: Payer: Self-pay

## 2020-01-16 ENCOUNTER — Encounter (HOSPITAL_COMMUNITY): Payer: Self-pay | Admitting: Urology

## 2020-01-16 LAB — CBC
HCT: 27.8 % — ABNORMAL LOW (ref 39.0–52.0)
Hemoglobin: 9.2 g/dL — ABNORMAL LOW (ref 13.0–17.0)
MCH: 31.5 pg (ref 26.0–34.0)
MCHC: 33.1 g/dL (ref 30.0–36.0)
MCV: 95.2 fL (ref 80.0–100.0)
Platelets: 422 10*3/uL — ABNORMAL HIGH (ref 150–400)
RBC: 2.92 MIL/uL — ABNORMAL LOW (ref 4.22–5.81)
RDW: 13 % (ref 11.5–15.5)
WBC: 8.1 10*3/uL (ref 4.0–10.5)
nRBC: 0 % (ref 0.0–0.2)

## 2020-01-16 LAB — RENAL FUNCTION PANEL
Albumin: 3.6 g/dL (ref 3.5–5.0)
Anion gap: 8 (ref 5–15)
BUN: 47 mg/dL — ABNORMAL HIGH (ref 8–23)
CO2: 17 mmol/L — ABNORMAL LOW (ref 22–32)
Calcium: 8.9 mg/dL (ref 8.9–10.3)
Chloride: 113 mmol/L — ABNORMAL HIGH (ref 98–111)
Creatinine, Ser: 2.38 mg/dL — ABNORMAL HIGH (ref 0.61–1.24)
GFR calc non Af Amer: 26 mL/min — ABNORMAL LOW (ref >60)
Glucose, Bld: 171 mg/dL — ABNORMAL HIGH (ref 70–99)
Phosphorus: 3.8 mg/dL (ref 2.5–4.6)
Potassium: 5.6 mmol/L — ABNORMAL HIGH (ref 3.5–5.1)
Sodium: 138 mmol/L (ref 135–145)

## 2020-01-16 LAB — GLUCOSE, CAPILLARY
Glucose-Capillary: 143 mg/dL — ABNORMAL HIGH (ref 70–99)
Glucose-Capillary: 157 mg/dL — ABNORMAL HIGH (ref 70–99)
Glucose-Capillary: 149 mg/dL — ABNORMAL HIGH (ref 70–99)
Glucose-Capillary: 122 mg/dL — ABNORMAL HIGH (ref 70–99)

## 2020-01-16 MED ORDER — SODIUM ZIRCONIUM CYCLOSILICATE 10 G PO PACK
10.0000 g | PACK | Freq: Once | ORAL | Status: AC
  Administered 2020-01-16: 10 g via ORAL
  Filled 2020-01-16: qty 1

## 2020-01-16 NOTE — Patient Outreach (Signed)
  [facility]) Care Management Chronic Special Needs Program    01/16/2020  Name: [name], DOB: March 15, 1946  MRN: [id number]   [name] is enrolled in a chronic special needs plan for Diabetes.   RNCM received notification that client was admitted to hospital due to Acute kidney injury, hyperkalemia. Care Plan sent to [facility] health utilization management team.  Plan: [facility] Case Management Team will follow member moving forward with assessments, care plans and documentation.   Thea Silversmith, RN, MSN, [place] [phone]

## 2020-01-16 NOTE — Progress Notes (Signed)
[facility] Progress Note  Subjective: bilat ureteral stents placed, hydro was on the L only, ureteral stone not removed yet. 3 L UOP yest. Creat down 2.3 today.   Vitals:   01/15/20 1749 01/15/20 2134 01/16/20 0547 01/16/20 1216  BP: (!) 159/63 (!) 170/77 (!) 152/69 (!) 149/69  Pulse: 64 67 66 69  Resp: (!) '22 18 18 18  ' Temp: 98.1 F (36.7 C) 98.2 F (36.8 C) 98.4 F (36.9 C) 98.2 F (36.8 C)  TempSrc: Oral Oral    SpO2: 99% 94% 99% 98%  Weight: 108.9 kg     Height: '6\' 2"'  (1.88 m)       Exam: Gen WD WN in no distress No rash, cyanosis or gangrene, dry skin in LE's Sclera anicteric, throat clear No jvd or bruits Chest clear bilat to bases no rales or wheezin RRR no MRG, distant HS Abd soft ntnd no mass or ascites +bs GU normal male MS no joint effusions or deformity Ext no leg or UE edema, no wounds or ulcers Neuro is alert, Ox 3 , nf, no asterixis      Date                Creat               eGFR  2010- 2013     0.74- 0.97  11/2012            1.71  2015               1.73  2016               1.17                 >60  2017               1.09  2018               1.08  10/2018            1.33                   01/2019          1.30  10/01/19           1.5                   54  10/24/19           1.62                 48  12/06/19           2.02                 37  9/27/ 21          3.11                 37  01/13/20         3.31                 22  10/05 /21        3.21                 20   Home meds:  - norvasc 10/ asa 81/ lipitor 40/ coreg 25 bid/ hctz 25 qd/ losartan 100 qd/ aldactone 25 qd  - januvia 50 qd/ glimepiride 4 am  - neurontin 300 tid/ prilosec 40 qd  - prn's/ vitamins/ supplements  UA 10/5 - negative   Abd CT 01/14/20 - The left kidney demonstrates moderate to marked hydroureteronephrosis down to an obstructing 7.5 mm calculus in the distal left ureter along the upper margin of the left acetabulum. No right-sided ureteral calculi or  hydronephrosis. No obvious bladder calculi or mass without contrast. The bladder is moderately obscured by artifact from bilateral hip prostheses.  Assessment/ Plan: 1. AoCKD 3a - b/l creat 1.3- 1.6, eGFR 48- 61m/min.  Creat worsening over last few mos. UA negative. CT > severe L hydro w/ nonobstructing 571mstones in bilat lower poles. Underwent bilat ureteral stenting, L ureteral stone was obstructing but not removed yet. Creat down 3.3 > 2.8 > 2.5 > 2.3 today. Pt doing well, euvolemic, BP's okay.  Expect further improvement. Would avoid aldactone and acei/ ARB for 1-2 mos. Will sign off.  2. Hyperkalemia - dc lokelma and renal diet when K+ < 5.0.   3. Hx prostate cancer - in 2018 rx w/ radiation 4. HTN - taking ARB, BB and norvasc at home. Holding losartan/ ARB for now, can use others as needed.  5. DM2 - per pmd       Rob Grisel Blumenstock 01/16/2020, 3:46 PM   Recent Labs  Lab 01/15/20 0448 01/16/20 0434  K 6.2* 5.6*  BUN 49* 47*  CREATININE 2.63* 2.38*  CALCIUM 8.3* 8.9  PHOS 4.3 3.8  HGB 8.3* 9.2*   Inpatient medications: . amLODipine  10 mg Oral Daily  . aspirin  81 mg Oral Daily  . atorvastatin  40 mg Oral Daily  . carvedilol  25 mg Oral BID WC  . Chlorhexidine Gluconate Cloth  6 each Topical Daily  . ferrous sulfate  325 mg Oral Q breakfast  . heparin  5,000 Units Subcutaneous Q8H  . insulin aspart  0-9 Units Subcutaneous TID WC  . sodium chloride flush  3 mL Intravenous Q12H   . sodium chloride 75 mL/hr at 01/16/20 1011   acetaminophen **OR** acetaminophen, docusate sodium, hydrALAZINE, HYDROcodone-acetaminophen, HYDROmorphone (DILAUDID) injection, phenol, polyethylene glycol

## 2020-01-16 NOTE — Progress Notes (Addendum)
PROGRESS NOTE    [name]  NKN:[id number] DOB: 1945/05/14 DOA: 01/14/2020 PCP: Luetta Nutting, DO     Brief Narrative:  [name] is a 74 year old male with past medical history of hypertension, diabetes, CAD, MI who presented to the ED with abnormal lab work.  He had lab work done yesterday by his PCP to follow-up on his renal function.  Upon chart review the patient had elevated creatinine to 2 in August which was thought to be secondary to a recent GI illness and dehydration however repeat blood work since that time showed an increased creatinine of 3.  He was told to immediately come to the ER due to elevation in potassium.  He has been feeling bloated over the past several weeks and constipated for 2 weeks.  Per ED provider he was given a new medication for leg pain but does not know the name of it and has not been taking it recently.  No shortness of breath, chest pain, vomiting, hemoptysis.  No other issues.  CT abdomen pelvis revealed 7.5 mm distal left ureteral calculus causing moderate to marked left-sided hydronephrosis with bilateral renal calculi and advanced vascular calcifications.  Patient was treated for hyperkalemia.  Urology and nephrology were consulted.  Patient underwent cystoscopy, bilateral ureteral stent placement by Dr. Tresa Moore 10/6.  New events last 24 hours / Subjective: Doing well overall, able to get up to the bathroom using a walker.  Foley catheter remains in place  Assessment & Plan:   Principal Problem:   Hyperkalemia Active Problems:   Type 2 diabetes mellitus with hypoglycemia without coma (HCC)   Essential hypertension   CAD (coronary artery disease)   Hyperlipidemia with target LDL less than 70   AKI (acute kidney injury) ([place])   Hydroureteronephrosis   AKI on CKD stage III -Baseline creatinine 1.3-1.6 -Secondary to severe left hydroureteronephrosis -Nephrology following -IV fluid -Slowly improving  Hyperkalemia -Patient was given  NovoLog/dextrose, Lokelma, calcium gluconate -Continue to hold spironolactone, losartan -Nephrology following -Give another dose of Lokelma today  Left ureteral calculus with moderate to marked left hydroureteronephrosis -Status post cystoscopy, bilateral ureteral stent placement by Dr. Tresa Moore 10/6  Diabetes mellitus type 2 -Continue to hold home glimepiride, sitagliptin -Sliding scale insulin  Essential hypertension -Continue amlodipine, Coreg  Hyperlipidemia -Continue Lipitor  CAD -Continue aspirin, Coreg  Mild hyponatremia -Monitor    DVT prophylaxis:  heparin injection 5,000 Units Start: 01/14/20 2200  Code Status: DNI Family Communication: None at bedside Disposition Plan:  Status is: Inpatient  Remains inpatient appropriate because:Persistent severe electrolyte disturbances and Ongoing diagnostic testing needed not appropriate for outpatient work up   Dispo: The patient is from: Home              Anticipated d/c is to: Home              Anticipated d/c date is: 1 day              Patient currently is not medically stable to d/c.  Continue to monitor hypokalemia and AKI closely.   Consultants:  Nephrology Urology   Antimicrobials:  Anti-infectives (From admission, onward)    Start     Dose/Rate Route Frequency Ordered Stop   01/15/20 1448  ceFAZolin (ANCEF) 2-4 GM/100ML-% IVPB       Note to Pharmacy: Charmayne Sheer   : cabinet override      01/15/20 1448 01/16/20 0259        Objective: Vitals:   01/15/20 1730  01/15/20 1749 01/15/20 2134 01/16/20 0547  BP: (!) 160/78 (!) 159/63 (!) 170/77 (!) 152/69  Pulse: 66 64 67 66  Resp: 14 (!) 22 18 18   Temp: 98 F (36.7 C) 98.1 F (36.7 C) 98.2 F (36.8 C) 98.4 F (36.9 C)  TempSrc:  Oral Oral   SpO2: 96% 99% 94% 99%  Weight:  108.9 kg    Height:  6\' 2"  (1.88 m)      Intake/Output Summary (Last 24 hours) at 01/16/2020 1158 Last data filed at 01/16/2020 0851 Gross per 24 hour  Intake 2003.14 ml    Output 3100 ml  Net -1096.86 ml   Filed Weights   01/14/20 0844 01/15/20 1749  Weight: 108.9 kg 108.9 kg    Examination: General exam: Appears calm and comfortable  Respiratory system: Clear to auscultation. Respiratory effort normal. Cardiovascular system: S1 & S2 heard, RRR. No pedal edema. Gastrointestinal system: Abdomen is nondistended, soft and nontender. Normal bowel sounds heard. GU: Foley catheter draining bloody urine Central nervous system: Alert and oriented. Non focal exam. Speech clear  Extremities: Symmetric in appearance bilaterally  Skin: No rashes, lesions or ulcers on exposed skin  Psychiatry: Judgement and insight appear stable. Mood & affect appropriate.    Data Reviewed: I have personally reviewed following labs and imaging studies  CBC: Recent Labs  Lab 01/14/20 1027 01/14/20 2020 01/15/20 0448 01/16/20 0434  WBC 9.4 7.8 7.0 8.1  NEUTROABS 7.3  --   --   --   HGB 9.6* 8.7* 8.3* 9.2*  HCT 28.6* 26.0* 24.9* 27.8*  MCV 94.4 93.9 94.0 95.2  PLT 453* 379 363 127*   Basic Metabolic Panel: Recent Labs  Lab 01/13/20 0716 01/14/20 1027 01/14/20 2020 01/15/20 0448 01/16/20 0434  NA 136 139 132* 131* 138  K 6.4* 6.3* 5.5* 6.2* 5.6*  CL 110 111 110 109 113*  CO2 19* 16* 13* 18* 17*  GLUCOSE 58* 61* 171* 157* 171*  BUN 52* 55* 50* 49* 47*  CREATININE 3.31* 3.20* 2.85* 2.63* 2.38*  CALCIUM 8.9 8.8* 8.2* 8.3* 8.9  PHOS  --   --   --  4.3 3.8   GFR: Estimated Creatinine Clearance: 35.8 mL/min (A) (by C-G formula based on SCr of 2.38 mg/dL (H)). Liver Function Tests: Recent Labs  Lab 01/14/20 1027 01/15/20 0448 01/16/20 0434  AST 10*  --   --   ALT 12  --   --   ALKPHOS 70  --   --   BILITOT 0.6  --   --   PROT 8.9*  --   --   ALBUMIN 4.0 3.3* 3.6   No results for input(s): LIPASE, AMYLASE in the last 168 hours. No results for input(s): AMMONIA in the last 168 hours. Coagulation Profile: No results for input(s): INR, PROTIME in the last  168 hours. Cardiac Enzymes: No results for input(s): CKTOTAL, CKMB, CKMBINDEX, TROPONINI in the last 168 hours. BNP (last 3 results) No results for input(s): PROBNP in the last 8760 hours. HbA1C: No results for input(s): HGBA1C in the last 72 hours. CBG: Recent Labs  Lab 01/15/20 0748 01/15/20 1239 01/15/20 1507 01/15/20 1730 01/16/20 0745  GLUCAP 137* 132* 85 97 143*   Lipid Profile: No results for input(s): CHOL, HDL, LDLCALC, TRIG, CHOLHDL, LDLDIRECT in the last 72 hours. Thyroid Function Tests: No results for input(s): TSH, T4TOTAL, FREET4, T3FREE, THYROIDAB in the last 72 hours. Anemia Panel: No results for input(s): VITAMINB12, FOLATE, FERRITIN, TIBC, IRON, RETICCTPCT in the  last 72 hours. Sepsis Labs: No results for input(s): PROCALCITON, LATICACIDVEN in the last 168 hours.  Recent Results (from the past 240 hour(s))  Respiratory Panel by RT PCR (Flu A&B, Covid) - Nasopharyngeal Swab     Status: None   Collection Time: 01/14/20 11:10 AM   Specimen: Nasopharyngeal Swab  Result Value Ref Range Status   SARS Coronavirus 2 by RT PCR NEGATIVE NEGATIVE Final    Comment: (NOTE) SARS-CoV-2 target nucleic acids are NOT DETECTED.  The SARS-CoV-2 RNA is generally detectable in upper respiratoy specimens during the acute phase of infection. The lowest concentration of SARS-CoV-2 viral copies this assay can detect is 131 copies/mL. A negative result does not preclude SARS-Cov-2 infection and should not be used as the sole basis for treatment or other patient management decisions. A negative result may occur with  improper specimen collection/handling, submission of specimen other than nasopharyngeal swab, presence of viral mutation(s) within the areas targeted by this assay, and inadequate number of viral copies (<131 copies/mL). A negative result must be combined with clinical observations, patient history, and epidemiological information. The expected result is  Negative.  Fact Sheet for Patients:  PinkCheek.be  Fact Sheet for Healthcare Providers:  GravelBags.it  This test is no t yet approved or cleared by the [place] FDA and  has been authorized for detection and/or diagnosis of SARS-CoV-2 by FDA under an Emergency Use Authorization (EUA). This EUA will remain  in effect (meaning this test can be used) for the duration of the COVID-19 declaration under Section 564(b)(1) of the Act, 21 U.S.C. section 360bbb-3(b)(1), unless the authorization is terminated or revoked sooner.     Influenza A by PCR NEGATIVE NEGATIVE Final   Influenza B by PCR NEGATIVE NEGATIVE Final    Comment: (NOTE) The Xpert Xpress SARS-CoV-2/FLU/RSV assay is intended as an aid in  the diagnosis of influenza from Nasopharyngeal swab specimens and  should not be used as a sole basis for treatment. Nasal washings and  aspirates are unacceptable for Xpert Xpress SARS-CoV-2/FLU/RSV  testing.  Fact Sheet for Patients: PinkCheek.be  Fact Sheet for Healthcare Providers: GravelBags.it  This test is not yet approved or cleared by the [place] FDA and  has been authorized for detection and/or diagnosis of SARS-CoV-2 by  FDA under an Emergency Use Authorization (EUA). This EUA will remain  in effect (meaning this test can be used) for the duration of the  Covid-19 declaration under Section 564(b)(1) of the Act, 21  U.S.C. section 360bbb-3(b)(1), unless the authorization is  terminated or revoked. Performed at [facility], [place] [address] [place], [place] [address]   Urine culture     Status: None   Collection Time: 01/14/20  3:44 PM   Specimen: Urine, Random  Result Value Ref Range Status   Specimen Description   Final    URINE, RANDOM Performed at [facility] [address], [place], [place] [address]     Special Requests   Final    NONE Performed at [facility], [place] [address] [place], [place] [address]    Culture   Final    NO GROWTH Performed at [facility], [place] [address] [place], [place] [address]    Report Status 01/15/2020 FINAL  Final      Radiology Studies: CT ABDOMEN PELVIS WO CONTRAST  Result Date: 01/14/2020 CLINICAL DATA:  Abdominal distension.  Elevated BUN and creatinine. EXAM: CT ABDOMEN AND PELVIS WITHOUT CONTRAST TECHNIQUE: Multidetector  CT imaging of the abdomen and pelvis was performed following the standard protocol without IV contrast. COMPARISON:  07/25/2013 FINDINGS: Lower chest: The lung bases are clear of an acute process. No pleural effusion or pulmonary lesions. The heart is within normal limits in size for age. No pericardial effusion. Scattered aortic and coronary artery calcifications are noted. The distal esophagus is grossly normal. Hepatobiliary: No hepatic lesions are identified without contrast. The gallbladder appears normal. No common bile duct dilatation. Pancreas: No mass, inflammation or ductal dilatation. Spleen: Normal size. There is a stable low-attenuation lesion consistent with a benign cyst. Adrenals/Urinary Tract: The adrenal glands are unremarkable. Bilateral renal calculi are noted. The left kidney demonstrates moderate to marked hydroureteronephrosis down to an obstructing 7.5 mm calculus in the distal left ureter along the upper margin of the left acetabulum. No right-sided ureteral calculi or hydronephrosis. No obvious bladder calculi or mass without contrast. The bladder is moderately obscured by artifact from bilateral hip prostheses. Stomach/Bowel: The stomach, duodenum, small bowel and colon are unremarkable. No acute inflammatory changes, mass lesions or obstructive findings. Vascular/Lymphatic: Advanced vascular calcifications but no aneurysm. No mesenteric or retroperitoneal mass or adenopathy. Reproductive: The  prostate gland and seminal vesicles are grossly normal. There appear to be scattered brachytherapy seeds in the prostate gland. Other: Left inguinal hernia containing fat is noted. No inguinal adenopathy. Small periumbilical abdominal wall hernia containing fat. Musculoskeletal: No significant bony findings. IMPRESSION: 1. 7.5 mm distal left ureteral calculus causing moderate to marked left-sided hydroureteronephrosis. 2. Bilateral renal calculi. 3. No other significant abdominal/pelvic findings, mass lesions or adenopathy. 4. Advanced vascular calcifications. 5. Aortic atherosclerosis. Aortic Atherosclerosis (ICD10-I70.0). Electronically Signed   By: Marijo Sanes M.D.   On: 01/14/2020 15:37   US RENAL  Result Date: 01/14/2020 CLINICAL DATA:  Initial evaluation for acute on chronic renal failure. EXAM: RENAL / URINARY TRACT ULTRASOUND COMPLETE COMPARISON:  Prior CT from 01/14/2020. FINDINGS: Right Kidney: Renal measurements: 11.0 x 6.4 x 6.4 cm = volume: 232.1 mL. Increased echogenicity seen within the renal parenchyma. No visible nephrolithiasis evident by sonography. No hydronephrosis. No focal renal mass. Left Kidney: Renal measurements: 13.4 x 6.3 x 7.3 cm = volume: 321.3 mL. Mildly increased echogenicity seen within the renal parenchyma. 6 mm nonobstructive calculus present at the lower pole. Moderate hydronephrosis, corresponding with abnormality seen on prior CT. No focal renal mass. Bladder: Appears normal for degree of bladder distention. Neither ureteral jet visualized. Other: None. IMPRESSION: 1. Moderate left-sided hydronephrosis, corresponding with abnormality seen on prior CT. 2. Additional 6 mm nonobstructive left renal nephrolithiasis. 3. Mildly increased echogenicity within the renal parenchyma, suggesting underlying medical renal disease. Electronically Signed   By: Jeannine Boga M.D.   On: 01/14/2020 18:42   DG C-Arm 1-60 Min-No Report  Result Date: 01/15/2020 Fluoroscopy was  utilized by the requesting physician.  No radiographic interpretation.      Scheduled Meds:  amLODipine  10 mg Oral Daily   aspirin  81 mg Oral Daily   atorvastatin  40 mg Oral Daily   carvedilol  25 mg Oral BID WC   Chlorhexidine Gluconate Cloth  6 each Topical Daily   ferrous sulfate  325 mg Oral Q breakfast   heparin  5,000 Units Subcutaneous Q8H   insulin aspart  0-9 Units Subcutaneous TID WC   sodium chloride flush  3 mL Intravenous Q12H   Continuous Infusions:  sodium chloride 75 mL/hr at 01/16/20 1011     LOS: 2 days  Time spent: 25 minutes   Dessa Phi, DO [facility] 01/16/2020, 11:58 AM   Available via Epic secure chat 7am-7pm After these hours, please refer to coverage provider listed on [email or web address]

## 2020-01-17 LAB — RENAL FUNCTION PANEL
Albumin: 3.8 g/dL (ref 3.5–5.0)
Anion gap: 8 (ref 5–15)
BUN: 49 mg/dL — ABNORMAL HIGH (ref 8–23)
CO2: 17 mmol/L — ABNORMAL LOW (ref 22–32)
Calcium: 8.9 mg/dL (ref 8.9–10.3)
Chloride: 114 mmol/L — ABNORMAL HIGH (ref 98–111)
Creatinine, Ser: 1.92 mg/dL — ABNORMAL HIGH (ref 0.61–1.24)
GFR calc non Af Amer: 34 mL/min — ABNORMAL LOW (ref >60)
Glucose, Bld: 146 mg/dL — ABNORMAL HIGH (ref 70–99)
Phosphorus: 3.4 mg/dL (ref 2.5–4.6)
Potassium: 4.8 mmol/L (ref 3.5–5.1)
Sodium: 139 mmol/L (ref 135–145)

## 2020-01-17 LAB — CBC
HCT: 27 % — ABNORMAL LOW (ref 39.0–52.0)
Hemoglobin: 8.7 g/dL — ABNORMAL LOW (ref 13.0–17.0)
MCH: 31 pg (ref 26.0–34.0)
MCHC: 32.2 g/dL (ref 30.0–36.0)
MCV: 96.1 fL (ref 80.0–100.0)
Platelets: 402 10*3/uL — ABNORMAL HIGH (ref 150–400)
RBC: 2.81 MIL/uL — ABNORMAL LOW (ref 4.22–5.81)
RDW: 13 % (ref 11.5–15.5)
WBC: 8.6 10*3/uL (ref 4.0–10.5)
nRBC: 0 % (ref 0.0–0.2)

## 2020-01-17 LAB — GLUCOSE, CAPILLARY
Glucose-Capillary: 140 mg/dL — ABNORMAL HIGH (ref 70–99)
Glucose-Capillary: 128 mg/dL — ABNORMAL HIGH (ref 70–99)

## 2020-01-17 NOTE — Care Management Important Message (Signed)
Important Message  Patient Details IM Letter given to the Patient Name: [name] MRN: [id number] Date of Birth: 09/23/1945   Medicare Important Message Given:  Yes     Kerin Salen 01/17/2020, 11:13 AM

## 2020-01-17 NOTE — Anesthesia Postprocedure Evaluation (Addendum)
Anesthesia Post Note  Patient: [name]  Procedure(s) Performed: CYSTOSCOPY WITH RETROGRADE PYELOGRAM/URETERAL STENT PLACEMENT (Bilateral )     Patient location during evaluation: PACU Anesthesia Type: General Level of consciousness: awake and alert Pain management: pain level controlled Vital Signs Assessment: post-procedure vital signs reviewed and stable Respiratory status: spontaneous breathing, nonlabored ventilation, respiratory function stable and patient connected to nasal cannula oxygen Cardiovascular status: blood pressure returned to baseline and stable Postop Assessment: no apparent nausea or vomiting Anesthetic complications: no   No complications documented.               Effie Berkshire

## 2020-01-17 NOTE — Discharge Summary (Signed)
Physician Discharge Summary  [name] XTG:[id number] DOB: 06-20-1945 DOA: 01/14/2020  PCP: Luetta Nutting, DO  Admit date: 01/14/2020 Discharge date: 01/17/2020  Admitted From: Home Disposition:  Home  Recommendations for Outpatient Follow-up:  1. Follow up with PCP in 1 week 2. Follow up with Urology  3. Please obtain BMP in 1 week to check kidney function. AKI improving during hospitalization.  Discharge Condition: Stable CODE STATUS: Partial code, DNI  Diet recommendation:  Diet Orders (From admission, onward)    Start     Ordered   01/14/20 1921  Diet renal/carb modified with fluid restriction Diet-HS Snack? Nothing; Fluid restriction: 1200 mL Fluid; Room service appropriate? Yes; Fluid consistency: Thin  Diet effective now       Question Answer Comment  Diet-HS Snack? Nothing   Fluid restriction: 1200 mL Fluid   Room service appropriate? Yes   Fluid consistency: Thin      01/14/20 1921         Brief/Interim Summary: [name] is a 74 year old male with past medical history of hypertension, diabetes, CAD, MIwho presented to the ED with abnormal lab work. He had lab work done yesterday by his PCP to follow-up onhis renal function. Upon chart review the patient had elevated creatinine to 2 in August which was thought to be secondary to a recent GI illness and dehydration however repeat blood work since that time showed an increased creatinine of 3. He was told to immediately come to the ER due to elevation in potassium. He has been feeling bloated over the past several weeks and constipated for 2 weeks. Per ED provider hewas given a new medication for leg pain but does not know the name of it and has not been taking it recently. No shortness of breath, chest pain, vomiting, hemoptysis.No other issues.  CT abdomen pelvis revealed 7.5 mm distal left ureteral calculus causing moderate to marked left-sided hydronephrosis with bilateral renal calculi and advanced  vascular calcifications.  Patient was treated for hyperkalemia.  Urology and nephrology were consulted.  Patient underwent cystoscopy, bilateral ureteral stent placement by Dr. Tresa Moore 10/6.  Discharge Diagnoses:  Principal Problem:   Hyperkalemia Active Problems:   Type 2 diabetes mellitus with hypoglycemia without coma (HCC)   Essential hypertension   CAD (coronary artery disease)   Hyperlipidemia with target LDL less than 70   AKI (acute kidney injury) ([place])   Hydroureteronephrosis   AKI on CKD stage III -Baseline creatinine 1.3-1.6 -Secondary to severe left hydroureteronephrosis -Appreciate Nephrology, AKI continues to improve  -Continue to hold spironolactone, losartan for 1-2 months and resume as indicated per PCP   Hyperkalemia -Patient was given NovoLog/dextrose, Lokelma, calcium gluconate -Continue to hold spironolactone, losartan -Resolved   Left ureteral calculus with moderate to marked left hydroureteronephrosis -Status post cystoscopy, bilateral ureteral stent placement by Dr. Tresa Moore 10/6  Diabetes mellitus type 2 -SSI during hospitalization   Essential hypertension -Continue amlodipine, Coreg  Hyperlipidemia -Continue Lipitor  CAD -Continue aspirin, Coreg  Mild hyponatremia -Resolved     Discharge Instructions  Discharge Instructions    Call MD for:  difficulty breathing, headache or visual disturbances   Complete by: As directed    Call MD for:  extreme fatigue   Complete by: As directed    Call MD for:  persistant dizziness or light-headedness   Complete by: As directed    Call MD for:  persistant nausea and vomiting   Complete by: As directed    Call MD for:  severe  uncontrolled pain   Complete by: As directed    Call MD for:  temperature >100.4   Complete by: As directed    Discharge instructions   Complete by: As directed    You were cared for by a hospitalist during your hospital stay. If you have any questions about your discharge  medications or the care you received while you were in the hospital after you are discharged, you can call the unit and ask to speak with the hospitalist on call if the hospitalist that took care of you is not available. Once you are discharged, your primary care physician will handle any further medical issues. Please note that NO REFILLS for any discharge medications will be authorized once you are discharged, as it is imperative that you return to your primary care physician (or establish a relationship with a primary care physician if you do not have one) for your aftercare needs so that they can reassess your need for medications and monitor your lab values.   Increase activity slowly   Complete by: As directed      Allergies as of 01/17/2020      Reactions   Shellfish Allergy Rash      Medication List    STOP taking these medications   hydrochlorothiazide 25 MG tablet Commonly known as: HYDRODIURIL   losartan 100 MG tablet Commonly known as: COZAAR   spironolactone 25 MG tablet Commonly known as: ALDACTONE     TAKE these medications   albuterol 108 (90 Base) MCG/ACT inhaler Commonly known as: VENTOLIN HFA Inhale 2 puffs into the lungs every 6 (six) hours as needed for wheezing or shortness of breath.   amLODipine 10 MG tablet Commonly known as: NORVASC Take 10 mg by mouth daily.   Aspirin 81 MG Caps Take 81 mg by mouth daily.   atorvastatin 40 MG tablet Commonly known as: LIPITOR Take 1 tablet (40 mg total) by mouth daily.   carvedilol 25 MG tablet Commonly known as: COREG Take 25 mg by mouth 2 (two) times daily with a meal.   co-enzyme Q-10 30 MG capsule Take 30 mg by mouth daily.   cycloSPORINE 0.05 % ophthalmic emulsion Commonly known as: RESTASIS Place 1 drop into both eyes 2 (two) times daily.   docusate sodium 100 MG capsule Commonly known as: COLACE Take 100 mg by mouth daily as needed for mild constipation.   ferrous sulfate 325 (65 FE) MG  tablet Take 325 mg by mouth daily with breakfast.   FreeStyle Libre 14 Day Reader Kerrin Mo See admin instructions.   FreeStyle Libre 14 Day Sensor Misc 1 Device by Does not apply route every 14 (fourteen) days.   gabapentin 300 MG capsule Commonly known as: NEURONTIN Take 1 capsule (300 mg total) by mouth 3 (three) times daily. Start 300mg  at bedtime x1 week then may increase to three times per day. What changed:   when to take this  additional instructions   glimepiride 4 MG tablet Commonly known as: AMARYL Take 4 mg by mouth daily before breakfast.   omeprazole 40 MG capsule Commonly known as: PriLOSEC Take 1 capsule (40 mg total) by mouth 2 (two) times daily. X 1 month, then once daily What changed:   when to take this  additional instructions   sitaGLIPtin 50 MG tablet Commonly known as: JANUVIA Take 1 tablet (50 mg total) by mouth daily.       Follow-up Information    Luetta Nutting, DO. Go on 01/21/2020.  Specialty: Family Medicine Contact information: [place] [address] [phone]        Alexis Frock, MD. Call.   Specialty: Urology Contact information: [address] [place] [address] [phone]              Allergies  Allergen Reactions  . Shellfish Allergy Rash    Consultations:  Urology  Nephrology    Procedures/Studies: CT ABDOMEN PELVIS WO CONTRAST  Result Date: 01/14/2020 CLINICAL DATA:  Abdominal distension.  Elevated BUN and creatinine. EXAM: CT ABDOMEN AND PELVIS WITHOUT CONTRAST TECHNIQUE: Multidetector CT imaging of the abdomen and pelvis was performed following the standard protocol without IV contrast. COMPARISON:  07/25/2013 FINDINGS: Lower chest: The lung bases are clear of an acute process. No pleural effusion or pulmonary lesions. The heart is within normal limits in size for age. No pericardial effusion. Scattered aortic and coronary artery calcifications are noted. The distal  esophagus is grossly normal. Hepatobiliary: No hepatic lesions are identified without contrast. The gallbladder appears normal. No common bile duct dilatation. Pancreas: No mass, inflammation or ductal dilatation. Spleen: Normal size. There is a stable low-attenuation lesion consistent with a benign cyst. Adrenals/Urinary Tract: The adrenal glands are unremarkable. Bilateral renal calculi are noted. The left kidney demonstrates moderate to marked hydroureteronephrosis down to an obstructing 7.5 mm calculus in the distal left ureter along the upper margin of the left acetabulum. No right-sided ureteral calculi or hydronephrosis. No obvious bladder calculi or mass without contrast. The bladder is moderately obscured by artifact from bilateral hip prostheses. Stomach/Bowel: The stomach, duodenum, small bowel and colon are unremarkable. No acute inflammatory changes, mass lesions or obstructive findings. Vascular/Lymphatic: Advanced vascular calcifications but no aneurysm. No mesenteric or retroperitoneal mass or adenopathy. Reproductive: The prostate gland and seminal vesicles are grossly normal. There appear to be scattered brachytherapy seeds in the prostate gland. Other: Left inguinal hernia containing fat is noted. No inguinal adenopathy. Small periumbilical abdominal wall hernia containing fat. Musculoskeletal: No significant bony findings. IMPRESSION: 1. 7.5 mm distal left ureteral calculus causing moderate to marked left-sided hydroureteronephrosis. 2. Bilateral renal calculi. 3. No other significant abdominal/pelvic findings, mass lesions or adenopathy. 4. Advanced vascular calcifications. 5. Aortic atherosclerosis. Aortic Atherosclerosis (ICD10-I70.0). Electronically Signed   By: Marijo Sanes M.D.   On: 01/14/2020 15:37   US RENAL  Result Date: 01/14/2020 CLINICAL DATA:  Initial evaluation for acute on chronic renal failure. EXAM: RENAL / URINARY TRACT ULTRASOUND COMPLETE COMPARISON:  Prior CT from  01/14/2020. FINDINGS: Right Kidney: Renal measurements: 11.0 x 6.4 x 6.4 cm = volume: 232.1 mL. Increased echogenicity seen within the renal parenchyma. No visible nephrolithiasis evident by sonography. No hydronephrosis. No focal renal mass. Left Kidney: Renal measurements: 13.4 x 6.3 x 7.3 cm = volume: 321.3 mL. Mildly increased echogenicity seen within the renal parenchyma. 6 mm nonobstructive calculus present at the lower pole. Moderate hydronephrosis, corresponding with abnormality seen on prior CT. No focal renal mass. Bladder: Appears normal for degree of bladder distention. Neither ureteral jet visualized. Other: None. IMPRESSION: 1. Moderate left-sided hydronephrosis, corresponding with abnormality seen on prior CT. 2. Additional 6 mm nonobstructive left renal nephrolithiasis. 3. Mildly increased echogenicity within the renal parenchyma, suggesting underlying medical renal disease. Electronically Signed   By: Jeannine Boga M.D.   On: 01/14/2020 18:42   DG C-Arm 1-60 Min-No Report  Result Date: 01/15/2020 Fluoroscopy was utilized by the requesting physician.  No radiographic interpretation.       Discharge Exam: Vitals:  01/16/20 2030 01/17/20 0541  BP: 130/64 127/70  Pulse: 64 71  Resp: 17 17  Temp: 98.1 F (36.7 C) 98.2 F (36.8 C)  SpO2: 99% 99%    General: Pt is alert, awake, not in acute distress Cardiovascular: RRR, S1/S2 +, no edema Respiratory: CTA bilaterally, no wheezing, no rhonchi, no respiratory distress, no conversational dyspnea  Abdominal: Soft, NT, ND, bowel sounds + Extremities: no edema, no cyanosis Psych: Normal mood and affect, stable judgement and insight     The results of significant diagnostics from this hospitalization (including imaging, microbiology, ancillary and laboratory) are listed below for reference.     Microbiology: Recent Results (from the past 240 hour(s))  Respiratory Panel by RT PCR (Flu A&B, Covid) - Nasopharyngeal Swab      Status: None   Collection Time: 01/14/20 11:10 AM   Specimen: Nasopharyngeal Swab  Result Value Ref Range Status   SARS Coronavirus 2 by RT PCR NEGATIVE NEGATIVE Final    Comment: (NOTE) SARS-CoV-2 target nucleic acids are NOT DETECTED.  The SARS-CoV-2 RNA is generally detectable in upper respiratoy specimens during the acute phase of infection. The lowest concentration of SARS-CoV-2 viral copies this assay can detect is 131 copies/mL. A negative result does not preclude SARS-Cov-2 infection and should not be used as the sole basis for treatment or other patient management decisions. A negative result may occur with  improper specimen collection/handling, submission of specimen other than nasopharyngeal swab, presence of viral mutation(s) within the areas targeted by this assay, and inadequate number of viral copies (<131 copies/mL). A negative result must be combined with clinical observations, patient history, and epidemiological information. The expected result is Negative.  Fact Sheet for Patients:  PinkCheek.be  Fact Sheet for Healthcare Providers:  GravelBags.it  This test is no t yet approved or cleared by the [place] FDA and  has been authorized for detection and/or diagnosis of SARS-CoV-2 by FDA under an Emergency Use Authorization (EUA). This EUA will remain  in effect (meaning this test can be used) for the duration of the COVID-19 declaration under Section 564(b)(1) of the Act, 21 U.S.C. section 360bbb-3(b)(1), unless the authorization is terminated or revoked sooner.     Influenza A by PCR NEGATIVE NEGATIVE Final   Influenza B by PCR NEGATIVE NEGATIVE Final    Comment: (NOTE) The Xpert Xpress SARS-CoV-2/FLU/RSV assay is intended as an aid in  the diagnosis of influenza from Nasopharyngeal swab specimens and  should not be used as a sole basis for treatment. Nasal washings and  aspirates are  unacceptable for Xpert Xpress SARS-CoV-2/FLU/RSV  testing.  Fact Sheet for Patients: PinkCheek.be  Fact Sheet for Healthcare Providers: GravelBags.it  This test is not yet approved or cleared by the [place] FDA and  has been authorized for detection and/or diagnosis of SARS-CoV-2 by  FDA under an Emergency Use Authorization (EUA). This EUA will remain  in effect (meaning this test can be used) for the duration of the  Covid-19 declaration under Section 564(b)(1) of the Act, 21  U.S.C. section 360bbb-3(b)(1), unless the authorization is  terminated or revoked. Performed at [facility], [place] [address], [place], [place] [address]   Urine culture     Status: None   Collection Time: 01/14/20  3:44 PM   Specimen: Urine, Random  Result Value Ref Range Status   Specimen Description   Final    URINE, RANDOM Performed at [facility] [address] [place], [place] [address]  Special Requests   Final    NONE Performed at [facility], [place] [address], [place], [place] [address]    Culture   Final    NO GROWTH Performed at [facility], [place] [address], [place], [place] [address]    Report Status 01/15/2020 FINAL  Final     Labs: BNP (last 3 results) No results for input(s): BNP in the last 8760 hours. Basic Metabolic Panel: Recent Labs  Lab 01/14/20 1027 01/14/20 2020 01/15/20 0448 01/16/20 0434 01/17/20 0444  NA 139 132* 131* 138 139  K 6.3* 5.5* 6.2* 5.6* 4.8  CL 111 110 109 113* 114*  CO2 16* 13* 18* 17* 17*  GLUCOSE 61* 171* 157* 171* 146*  BUN 55* 50* 49* 47* 49*  CREATININE 3.20* 2.85* 2.63* 2.38* 1.92*  CALCIUM 8.8* 8.2* 8.3* 8.9 8.9  PHOS  --   --  4.3 3.8 3.4   Liver Function Tests: Recent Labs  Lab 01/14/20 1027 01/15/20 0448 01/16/20 0434 01/17/20 0444  AST 10*  --   --   --   ALT 12  --   --   --   ALKPHOS 70  --   --    --   BILITOT 0.6  --   --   --   PROT 8.9*  --   --   --   ALBUMIN 4.0 3.3* 3.6 3.8   No results for input(s): LIPASE, AMYLASE in the last 168 hours. No results for input(s): AMMONIA in the last 168 hours. CBC: Recent Labs  Lab 01/14/20 1027 01/14/20 2020 01/15/20 0448 01/16/20 0434 01/17/20 0444  WBC 9.4 7.8 7.0 8.1 8.6  NEUTROABS 7.3  --   --   --   --   HGB 9.6* 8.7* 8.3* 9.2* 8.7*  HCT 28.6* 26.0* 24.9* 27.8* 27.0*  MCV 94.4 93.9 94.0 95.2 96.1  PLT 453* 379 363 422* 402*   Cardiac Enzymes: No results for input(s): CKTOTAL, CKMB, CKMBINDEX, TROPONINI in the last 168 hours. BNP: Invalid input(s): POCBNP CBG: Recent Labs  Lab 01/16/20 0745 01/16/20 1214 01/16/20 1639 01/16/20 2028 01/17/20 0726  GLUCAP 143* 157* 149* 122* 140*   D-Dimer No results for input(s): DDIMER in the last 72 hours. Hgb A1c No results for input(s): HGBA1C in the last 72 hours. Lipid Profile No results for input(s): CHOL, HDL, LDLCALC, TRIG, CHOLHDL, LDLDIRECT in the last 72 hours. Thyroid function studies No results for input(s): TSH, T4TOTAL, T3FREE, THYROIDAB in the last 72 hours.  Invalid input(s): FREET3 Anemia work up No results for input(s): VITAMINB12, FOLATE, FERRITIN, TIBC, IRON, RETICCTPCT in the last 72 hours. Urinalysis    Component Value Date/Time   COLORURINE STRAW (A) 01/14/2020 1544   APPEARANCEUR CLEAR 01/14/2020 1544   LABSPEC 1.013 01/14/2020 1544   PHURINE 5.0 01/14/2020 1544   GLUCOSEU NEGATIVE 01/14/2020 1544   HGBUR NEGATIVE 01/14/2020 [place] 01/14/2020 1544   KETONESUR NEGATIVE 01/14/2020 1544   PROTEINUR NEGATIVE 01/14/2020 1544   UROBILINOGEN 0.2 10/24/2014 1035   NITRITE NEGATIVE 01/14/2020 1544   LEUKOCYTESUR NEGATIVE 01/14/2020 1544   Sepsis Labs Invalid input(s): PROCALCITONIN,  WBC,  LACTICIDVEN Microbiology Recent Results (from the past 240 hour(s))  Respiratory Panel by RT PCR (Flu A&B, Covid) - Nasopharyngeal Swab      Status: None   Collection Time: 01/14/20 11:10 AM   Specimen: Nasopharyngeal Swab  Result Value Ref Range Status   SARS Coronavirus 2 by RT PCR NEGATIVE NEGATIVE  Final    Comment: (NOTE) SARS-CoV-2 target nucleic acids are NOT DETECTED.  The SARS-CoV-2 RNA is generally detectable in upper respiratoy specimens during the acute phase of infection. The lowest concentration of SARS-CoV-2 viral copies this assay can detect is 131 copies/mL. A negative result does not preclude SARS-Cov-2 infection and should not be used as the sole basis for treatment or other patient management decisions. A negative result may occur with  improper specimen collection/handling, submission of specimen other than nasopharyngeal swab, presence of viral mutation(s) within the areas targeted by this assay, and inadequate number of viral copies (<131 copies/mL). A negative result must be combined with clinical observations, patient history, and epidemiological information. The expected result is Negative.  Fact Sheet for Patients:  PinkCheek.be  Fact Sheet for Healthcare Providers:  GravelBags.it  This test is no t yet approved or cleared by the [place] FDA and  has been authorized for detection and/or diagnosis of SARS-CoV-2 by FDA under an Emergency Use Authorization (EUA). This EUA will remain  in effect (meaning this test can be used) for the duration of the COVID-19 declaration under Section 564(b)(1) of the Act, 21 U.S.C. section 360bbb-3(b)(1), unless the authorization is terminated or revoked sooner.     Influenza A by PCR NEGATIVE NEGATIVE Final   Influenza B by PCR NEGATIVE NEGATIVE Final    Comment: (NOTE) The Xpert Xpress SARS-CoV-2/FLU/RSV assay is intended as an aid in  the diagnosis of influenza from Nasopharyngeal swab specimens and  should not be used as a sole basis for treatment. Nasal washings and  aspirates are  unacceptable for Xpert Xpress SARS-CoV-2/FLU/RSV  testing.  Fact Sheet for Patients: PinkCheek.be  Fact Sheet for Healthcare Providers: GravelBags.it  This test is not yet approved or cleared by the [place] FDA and  has been authorized for detection and/or diagnosis of SARS-CoV-2 by  FDA under an Emergency Use Authorization (EUA). This EUA will remain  in effect (meaning this test can be used) for the duration of the  Covid-19 declaration under Section 564(b)(1) of the Act, 21  U.S.C. section 360bbb-3(b)(1), unless the authorization is  terminated or revoked. Performed at [facility], [place] [address], [place], [place] [address]   Urine culture     Status: None   Collection Time: 01/14/20  3:44 PM   Specimen: Urine, Random  Result Value Ref Range Status   Specimen Description   Final    URINE, RANDOM Performed at [facility] [address], [place], [place] [address]    Special Requests   Final    NONE Performed at [facility], [place] [address] [place], [place] [address]    Culture   Final    NO GROWTH Performed at [facility], [place] [address], [place],  [address]    Report Status 01/15/2020 FINAL  Final     Patient was seen and examined on the day of discharge and was found to be in stable condition. Time coordinating discharge: 25 minutes including assessment and coordination of care, as well as examination of the patient.   SIGNED:  Dessa Phi, DO [facility] 01/17/2020, 8:57 AM

## 2020-01-20 ENCOUNTER — Other Ambulatory Visit: Payer: Self-pay

## 2020-01-20 NOTE — Patient Outreach (Addendum)
°  [facility]) Care Management Chronic Special Needs Program    01/20/2020  Name: [name], DOB: 03-Dec-1945  MRN: [id number]   Mr. Mattheu Brodersen is enrolled in a chronic special needs plan for Diabetes. RNCM received notification that client discharged to home on 01/17/2020. Transition of care will be completed by [facility] care management team.  Goals Addressed            This Visit's Progress    General - Client will not be readmitted within 30 days (C-SNP)-discharged 01-17-20   On track    Please follow discharge instructions and call provider if you have any questions. Please attend all follow up appointments as scheduled. Please take your medications as prescribed. Please call 24 hour nurse advice line as needed. [phone]).        Plan: send updated care plan to client, send updated care plan to primary care provider.  [facility] Case Management Team will follow member moving forward with assessments, care plans and documentation.  Thea Silversmith, RN, MSN, [facility] [phone]

## 2020-01-21 ENCOUNTER — Ambulatory Visit (INDEPENDENT_AMBULATORY_CARE_PROVIDER_SITE_OTHER): Payer: HMO | Admitting: Family Medicine

## 2020-01-21 ENCOUNTER — Encounter: Payer: Self-pay | Admitting: Family Medicine

## 2020-01-21 ENCOUNTER — Other Ambulatory Visit: Payer: Self-pay

## 2020-01-21 VITALS — BP 154/74 | HR 75 | Wt 230.5 lb

## 2020-01-21 DIAGNOSIS — N1330 Unspecified hydronephrosis: Secondary | ICD-10-CM | POA: Diagnosis not present

## 2020-01-21 DIAGNOSIS — N179 Acute kidney failure, unspecified: Secondary | ICD-10-CM

## 2020-01-21 DIAGNOSIS — I10 Essential (primary) hypertension: Secondary | ICD-10-CM

## 2020-01-21 DIAGNOSIS — Z23 Encounter for immunization: Secondary | ICD-10-CM

## 2020-01-21 DIAGNOSIS — E1165 Type 2 diabetes mellitus with hyperglycemia: Secondary | ICD-10-CM

## 2020-01-21 DIAGNOSIS — E875 Hyperkalemia: Secondary | ICD-10-CM

## 2020-01-21 NOTE — Assessment & Plan Note (Signed)
Due to obstruction from ureteral stone.  Stents placed and has had improvement in renal function.  Holding nephrotoxic medications and will update renal function and potassium today.

## 2020-01-21 NOTE — Assessment & Plan Note (Signed)
Lab Results  Component Value Date   HGBA1C 7.1 (H) 12/20/2019  Blood sugars have been pretty well controlled.  Continue current medications.

## 2020-01-21 NOTE — Assessment & Plan Note (Signed)
BP elevated today but has not taken medications.  Readings at home have looked ok.   Continue current medications. F/u in 6-8 weeks if renal function remains stable we can consider adding additional antihypertensives on as needed.

## 2020-01-21 NOTE — Assessment & Plan Note (Signed)
2/2 to ureteral stone.  S/p b/l ureteral stents, has f/u with urology in 1-2 weeks.

## 2020-01-21 NOTE — Patient Instructions (Signed)
Great to see you! Have labs completed today.  Continue to hold losartan, spironolactone and hctz.   See me again in 6-8 weeks.

## 2020-01-21 NOTE — Progress Notes (Signed)
CROSS JORGE - 74 y.o. male MRN [id number]  Date of birth: [birth year]  Subjective No chief complaint on file.   HPI RAYQUON USELMAN is a 74 y.o. male here today for hospital follow up.  He was recently hospitalized due to AKI with hyperkalemia.  Obstructing ureteral stone found on CT with severe hydroureteronephrosis.  He had bilateral ureteral stent placement with Dr. Tresa Moore.  Aldactone, hctz  and losartan held.  Creatinine trending down and improved to 1.92 at time of discharge.  Potassium normalized at discharge.  Today he reports he is doing ok today.  He has follow up with urology to discuss laser lithotripsy in a week or so.    His blood pressure has been pretty well controlled at home.  He has not taken medications today because he though he needed to fast for lab work.  He denies chest pain, shortness of breath, chest pain, headache or vision changes.    Blood sugars remain pretty well controlled with current medications.  He was on SSI while in the hospital.    ROS:  A comprehensive ROS was completed and negative except as noted per HPI  Allergies  Allergen Reactions  . Shellfish Allergy Rash    Past Medical History:  Diagnosis Date  . Arthritis   . Asthma    no inhaler  . Coronary artery disease    cardiologist-  dr spruill; last visit 3 mos ago per pt  . GERD (gastroesophageal reflux disease)   . History of MI (myocardial infarction)    1985  . Hydronephrosis, left   . Hypertension   . Myocardial infarction ([place])   . Nephrolithiasis    left  . 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 Presence of tooth-root and mandibular implants    lower dental implants  . Prostate cancer ([place])   . Shortness of breath    WITH EXERTION  . Type 2 diabetes mellitus ([place])     Past Surgical History:  Procedure Laterality Date  . BUNIONECTOMY    . CYSTOSCOPY W/ RETROGRADES Left 03/14/2014   Procedure: CYSTOSCOPY WITH LEFT RETROGRADE PYELOGRAM, Left  Ureteroscopy, Lweft ureteral Stent No string;  Surgeon: [name], MD;  Location: [facility];  Service: Urology;  Laterality: Left;  . CYSTOSCOPY W/ URETERAL STENT PLACEMENT Bilateral 01/15/2020   Procedure: CYSTOSCOPY WITH RETROGRADE PYELOGRAM/URETERAL STENT PLACEMENT;  Surgeon: Alexis Frock, MD;  Location: [facility];  Service: Urology;  Laterality: Bilateral;  . HERNIA REPAIR    . PROSTATE BIOPSY    . TOTAL HIP ARTHROPLASTY Left 03-20-2009  . TOTAL HIP ARTHROPLASTY  04/09/2012   Procedure: TOTAL HIP ARTHROPLASTY;  Surgeon: [name], MD;  Location: [facility];  Service: Orthopedics;  Laterality: Right;  . TOTAL KNEE ARTHROPLASTY Left 05-16-2008    Social History   Socioeconomic History  . Marital status: Widowed    Spouse name: Not on file  . Number of children: Not on file  . Years of education: Not on file  . Highest education level: Not on file  Occupational History  . Not on file  Tobacco Use  . Smoking status: Former Smoker    Packs/day: 1.00    Years: 25.00    Pack years: 25.00    Types: Cigarettes    Quit date: 04/11/1984    Years since quitting: 35.8  . Smokeless tobacco: Former Systems developer    Quit date: 04/02/1985  Vaping Use  . Vaping Use: Never  used  Substance and Sexual Activity  . Alcohol use: Yes    Comment: RARE  . Drug use: No  . Sexual activity: Not Currently  Other Topics Concern  . Not on file  Social History Narrative  . Not on file   Social Determinants of Health   Financial Resource Strain:   . Difficulty of Paying Living Expenses: Not on file  Food Insecurity: No Food Insecurity  . Worried About Charity fundraiser in the Last Year: Never true  . Ran Out of Food in the Last Year: Never true  Transportation Needs: No Transportation Needs  . Lack of Transportation (Medical): No  . Lack of Transportation (Non-Medical): No  Physical Activity:   . Days of Exercise per Week: Not on file  . Minutes of Exercise per Session: Not on file   Stress:   . Feeling of Stress : Not on file  Social Connections:   . Frequency of Communication with Friends and Family: Not on file  . Frequency of Social Gatherings with Friends and Family: Not on file  . Attends Religious Services: Not on file  . Active Member of Clubs or Organizations: Not on file  . Attends Archivist Meetings: Not on file  . Marital Status: Not on file    Family History  Problem Relation Age of Onset  . Cancer Mother        breast  . Multiple sclerosis Daughter   . Cancer Father        prostate  . Cancer Maternal Uncle        bone    Health Maintenance  Topic Date Due  . COVID-19 Vaccine (1) Never done  . URINE MICROALBUMIN  10/23/2019  . INFLUENZA VACCINE  03/06/2020 (Originally 11/10/2019)  . FOOT EXAM  01/22/2020  . HEMOGLOBIN A1C  06/18/2020  . OPHTHALMOLOGY EXAM  09/22/2020  . TETANUS/TDAP  04/10/2025  . COLONOSCOPY  11/15/2028  . Hepatitis C Screening  Completed  . PNA vac Low Risk Adult  Completed     ----------------------------------------------------------------------------------------------------------------------------------------------------------------------------------------------------------------- Physical Exam BP (!) 154/74 (BP Location: Left Arm, Patient Position: Sitting, Cuff Size: Large)   Pulse 75   Wt 230 lb 8 oz (104.6 kg)   SpO2 97%   BMI 29.59 kg/m   Physical Exam Constitutional:      Appearance: Normal appearance.  HENT:     Head: Normocephalic and atraumatic.  Eyes:     General: No scleral icterus. Cardiovascular:     Rate and Rhythm: Normal rate and regular rhythm.  Pulmonary:     Effort: Pulmonary effort is normal.     Breath sounds: Normal breath sounds.  Abdominal:     Tenderness: There is no right CVA tenderness or left CVA tenderness.  Skin:    General: Skin is warm and dry.  Neurological:     General: No focal deficit present.     Mental Status: He is alert.  Psychiatric:        Mood  and Affect: Mood normal.        Behavior: Behavior normal.     ------------------------------------------------------------------------------------------------------------------------------------------------------------------------------------------------------------------- Assessment and Plan  Essential hypertension BP elevated today but has not taken medications.  Readings at home have looked ok.   Continue current medications. F/u in 6-8 weeks if renal function remains stable we can consider adding additional antihypertensives on as needed.   Uncontrolled type 2 diabetes mellitus with hyperglycemia, without long-term current use of insulin (HCC) Lab Results  Component Value  Date   HGBA1C 7.1 (H) 12/20/2019  Blood sugars have been pretty well controlled.  Continue current medications.   AKI (acute kidney injury) ([place]) Due to obstruction from ureteral stone.  Stents placed and has had improvement in renal function.  Holding nephrotoxic medications and will update renal function and potassium today.   Hydroureteronephrosis 2/2 to ureteral stone.  S/p b/l ureteral stents, has f/u with urology in 1-2 weeks.    No orders of the defined types were placed in this encounter.   No follow-ups on file.   35 minutes spent including pre visit preparation, review of prior notes and labs, encounter with patient via video visit and same day documentation.  This visit occurred during the SARS-CoV-2 public health emergency.  Safety protocols were in place, including screening questions prior to the visit, additional usage of staff PPE, and extensive cleaning of exam room while observing appropriate contact time as indicated for disinfecting solutions.

## 2020-01-22 ENCOUNTER — Telehealth: Payer: Self-pay

## 2020-01-22 LAB — BASIC METABOLIC PANEL
BUN/Creatinine Ratio: 18 (calc) (ref 6–22)
BUN: 29 mg/dL — ABNORMAL HIGH (ref 7–25)
CO2: 21 mmol/L (ref 20–32)
Calcium: 8.8 mg/dL (ref 8.6–10.3)
Chloride: 113 mmol/L — ABNORMAL HIGH (ref 98–110)
Creat: 1.63 mg/dL — ABNORMAL HIGH (ref 0.70–1.18)
Glucose, Bld: 117 mg/dL — ABNORMAL HIGH (ref 65–99)
Potassium: 5.1 mmol/L (ref 3.5–5.3)
Sodium: 142 mmol/L (ref 135–146)

## 2020-01-22 NOTE — Telephone Encounter (Signed)
Pt called stating if he can get his teeth cleaned by his dentist. Pt is due to have surgery in a few days and wanted to make sure it was okay for him to have dental cleaning. Pls advise, thanks.

## 2020-01-22 NOTE — Telephone Encounter (Signed)
Dental cleaning should be fine.

## 2020-01-31 NOTE — Telephone Encounter (Signed)
Left a vm msg for pt regarding provider's note. Direct call back info provided.

## 2020-02-06 DIAGNOSIS — I2510 Atherosclerotic heart disease of native coronary artery without angina pectoris: Secondary | ICD-10-CM | POA: Diagnosis not present

## 2020-02-06 DIAGNOSIS — N200 Calculus of kidney: Secondary | ICD-10-CM | POA: Diagnosis not present

## 2020-02-06 DIAGNOSIS — I252 Old myocardial infarction: Secondary | ICD-10-CM | POA: Diagnosis not present

## 2020-02-06 DIAGNOSIS — N1330 Unspecified hydronephrosis: Secondary | ICD-10-CM | POA: Diagnosis not present

## 2020-02-06 DIAGNOSIS — C61 Malignant neoplasm of prostate: Secondary | ICD-10-CM | POA: Diagnosis not present

## 2020-02-06 DIAGNOSIS — N1832 Chronic kidney disease, stage 3b: Secondary | ICD-10-CM | POA: Diagnosis not present

## 2020-02-06 DIAGNOSIS — E1122 Type 2 diabetes mellitus with diabetic chronic kidney disease: Secondary | ICD-10-CM | POA: Diagnosis not present

## 2020-02-06 DIAGNOSIS — N1831 Chronic kidney disease, stage 3a: Secondary | ICD-10-CM | POA: Diagnosis not present

## 2020-02-06 DIAGNOSIS — I129 Hypertensive chronic kidney disease with stage 1 through stage 4 chronic kidney disease, or unspecified chronic kidney disease: Secondary | ICD-10-CM | POA: Diagnosis not present

## 2020-02-06 DIAGNOSIS — M1990 Unspecified osteoarthritis, unspecified site: Secondary | ICD-10-CM | POA: Diagnosis not present

## 2020-02-10 DIAGNOSIS — E785 Hyperlipidemia, unspecified: Secondary | ICD-10-CM | POA: Diagnosis not present

## 2020-02-10 DIAGNOSIS — I10 Essential (primary) hypertension: Secondary | ICD-10-CM | POA: Diagnosis not present

## 2020-02-10 DIAGNOSIS — I25118 Atherosclerotic heart disease of native coronary artery with other forms of angina pectoris: Secondary | ICD-10-CM | POA: Diagnosis not present

## 2020-02-10 DIAGNOSIS — E119 Type 2 diabetes mellitus without complications: Secondary | ICD-10-CM | POA: Diagnosis not present

## 2020-02-18 ENCOUNTER — Other Ambulatory Visit: Payer: Self-pay

## 2020-02-18 NOTE — Patient Outreach (Signed)
  [facility]) Care Management Chronic Special Needs Program  02/18/2020  Name: [name] DOB: 1946-02-19  MRN: [id number]  [name] is enrolled in a chronic special needs plan for Diabetes. RNCM called to follow up, reviewed and updated care plan.  Subjective: client reports he is feeling, "a lot better".  He states he has followed up with primary care provider and has an upcoming appointment scheduled with urologist. Client reports blood sugar today was 125. He states blood sugar ranges 105-150. Client denies any questions or concerns at this time.  Goals Addressed            This Visit's Progress   . COMPLETED: General - Client will not be readmitted within 30 days (C-SNP)-discharged 01-17-20       No readmissions     . HEMOGLOBIN A1C < 7       A1C 7.1 on 12/20/19; 7.2 on 10/01/19 and 7.0 on 04/24/19.  Continue Diabetes self management actions:  Glucose monitoring per provider recommendations  Eat Healthy: eat low carbohydrate and low salt meals, watch portion sizes and avoid sugar sweetened drinks.  Visit provider every 3-6 months as directed  Hbg A1C level every 3-6 months.  Take Medications as prescribed.  Attend provider visits as prescribed.  Medications reviewed.    . Obtain Annual Foot Exam   On track    Last noted documentation 01/22/2019. Client reports will be done at upcoming "House visit".  Diabetes can affect the nerves inyour feet, causing decreased feeling or numbness. Please see provider as schedule for recommended annual exam.      . COMPLETED: Obtain annual screen for micro albuminuria (urine) , nephropathy (kidney problems)       Urine sodium/urine creatinine done 01/14/2020       Plan: send updated care plan to client; send updated care plan to primary care provider. [facility] [employer] Care Management Team will outreach to client per Tier level within the next 6 months. [facility]  will continue to provide services for this member through 04/10/2020. The [facility] Care Management Team will assume care 04/11/2020.   Thea Silversmith, RN, MSN, [facility] [phone]

## 2020-02-21 ENCOUNTER — Encounter: Payer: Self-pay | Admitting: Family Medicine

## 2020-02-21 ENCOUNTER — Other Ambulatory Visit: Payer: Self-pay

## 2020-02-21 ENCOUNTER — Ambulatory Visit (INDEPENDENT_AMBULATORY_CARE_PROVIDER_SITE_OTHER): Payer: HMO | Admitting: Family Medicine

## 2020-02-21 VITALS — BP 146/86 | HR 87 | Temp 98.3°F | Wt 243.2 lb

## 2020-02-21 DIAGNOSIS — N1330 Unspecified hydronephrosis: Secondary | ICD-10-CM

## 2020-02-21 DIAGNOSIS — R350 Frequency of micturition: Secondary | ICD-10-CM | POA: Diagnosis not present

## 2020-02-21 DIAGNOSIS — R8290 Unspecified abnormal findings in urine: Secondary | ICD-10-CM | POA: Diagnosis not present

## 2020-02-21 DIAGNOSIS — M10041 Idiopathic gout, right hand: Secondary | ICD-10-CM

## 2020-02-21 LAB — POCT URINALYSIS DIP (CLINITEK)
Bilirubin, UA: NEGATIVE
Glucose, UA: 100 mg/dL — AB
Ketones, POC UA: NEGATIVE mg/dL
Nitrite, UA: NEGATIVE
POC PROTEIN,UA: 100 — AB
Spec Grav, UA: 1.025 (ref 1.010–1.025)
Urobilinogen, UA: 0.2 E.U./dL
pH, UA: 5.5 (ref 5.0–8.0)

## 2020-02-21 MED ORDER — PREDNISONE 50 MG PO TABS: ORAL_TABLET | ORAL | 0 refills | Status: DC

## 2020-02-21 NOTE — Patient Instructions (Signed)

## 2020-02-23 DIAGNOSIS — M109 Gout, unspecified: Secondary | ICD-10-CM | POA: Insufficient documentation

## 2020-02-23 LAB — URINE CULTURE
MICRO NUMBER:: 11199685
SPECIMEN QUALITY:: ADEQUATE

## 2020-02-23 NOTE — Assessment & Plan Note (Addendum)
Will avoid NSAIDS and colchicine due to current renal function and obstruction .  Start prednisone 50mg  daily for up to 10 days.  Given handout on low purine diet.  Will plant to check uric acid once resolved.   Instructed to follow up if not improving.

## 2020-02-23 NOTE — Assessment & Plan Note (Signed)
Has upcoming appt with urology.  Continues to have urinary frequency.  UA with blood, sent for culture.

## 2020-02-23 NOTE — Progress Notes (Signed)
[name] - 74 y.o. male MRN [id number]  Date of birth: [birth year]  Subjective Chief Complaint  Patient presents with  . Gout    HPI [name] is a 74 y.o. male here today with complaint of R hand pain and swelling.  He has had similar episodes with gout flares.  There is some warmth that accompanies the swelling.  He denies fever or chills.   He has also had some urinary frequency/urgency.  He has appt with urology for follow up of stone/hydronephrosis next week but wants to have urine checked.  He denies dysuria or hematuria.    ROS:  A comprehensive ROS was completed and negative except as noted per HPI  Allergies  Allergen Reactions  . Shellfish Allergy Rash    Past Medical History:  Diagnosis Date  . Arthritis   . Asthma    no inhaler  . Coronary artery disease    cardiologist-  dr spruill; last visit 3 mos ago per pt  . GERD (gastroesophageal reflux disease)   . History of MI (myocardial infarction)    1985  . Hydronephrosis, left   . Hypertension   . Myocardial infarction ([place])   . Nephrolithiasis    left  . 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 Presence of tooth-root and mandibular implants    lower dental implants  . Prostate cancer ([place])   . Shortness of breath    WITH EXERTION  . Type 2 diabetes mellitus ([place])     Past Surgical History:  Procedure Laterality Date  . BUNIONECTOMY    . CYSTOSCOPY W/ RETROGRADES Left 03/14/2014   Procedure: CYSTOSCOPY WITH LEFT RETROGRADE PYELOGRAM, Left Ureteroscopy, Lweft ureteral Stent No string;  Surgeon: [name], MD;  Location: [facility];  Service: Urology;  Laterality: Left;  . CYSTOSCOPY W/ URETERAL STENT PLACEMENT Bilateral 01/15/2020   Procedure: CYSTOSCOPY WITH RETROGRADE PYELOGRAM/URETERAL STENT PLACEMENT;  Surgeon: Alexis Frock, MD;  Location: [facility];  Service: Urology;  Laterality: Bilateral;  . HERNIA REPAIR    . PROSTATE BIOPSY    . TOTAL HIP  ARTHROPLASTY Left 03-20-2009  . TOTAL HIP ARTHROPLASTY  04/09/2012   Procedure: TOTAL HIP ARTHROPLASTY;  Surgeon: [name], MD;  Location: [facility];  Service: Orthopedics;  Laterality: Right;  . TOTAL KNEE ARTHROPLASTY Left 05-16-2008    Social History   Socioeconomic History  . Marital status: Widowed    Spouse name: Not on file  . Number of children: Not on file  . Years of education: Not on file  . Highest education level: Not on file  Occupational History  . Not on file  Tobacco Use  . Smoking status: Former Smoker    Packs/day: 1.00    Years: 25.00    Pack years: 25.00    Types: Cigarettes    Quit date: 04/11/1984    Years since quitting: 35.8  . Smokeless tobacco: Former Systems developer    Quit date: 04/02/1985  Vaping Use  . Vaping Use: Never used  Substance and Sexual Activity  . Alcohol use: Yes    Comment: RARE  . Drug use: No  . Sexual activity: Not Currently  Other Topics Concern  . Not on file  Social History Narrative  . Not on file   Social Determinants of Health   Financial Resource Strain:   . Difficulty of Paying Living Expenses: Not on file  Food Insecurity: No Food Insecurity  . Worried About  Running Out of Food in the Last Year: Never true  . Ran Out of Food in the Last Year: Never true  Transportation Needs: No Transportation Needs  . Lack of Transportation (Medical): No  . Lack of Transportation (Non-Medical): No  Physical Activity:   . Days of Exercise per Week: Not on file  . Minutes of Exercise per Session: Not on file  Stress:   . Feeling of Stress : Not on file  Social Connections:   . Frequency of Communication with Friends and Family: Not on file  . Frequency of Social Gatherings with Friends and Family: Not on file  . Attends Religious Services: Not on file  . Active Member of Clubs or Organizations: Not on file  . Attends Archivist Meetings: Not on file  . Marital Status: Not on file    Family History  Problem Relation  Age of Onset  . Cancer Mother        breast  . Multiple sclerosis Daughter   . Cancer Father        prostate  . Cancer Maternal Uncle        bone    Health Maintenance  Topic Date Due  . COVID-19 Vaccine (2 - Moderna 2-dose series) 07/20/2019  . URINE MICROALBUMIN  10/23/2019  . FOOT EXAM  01/22/2020  . HEMOGLOBIN A1C  06/18/2020  . OPHTHALMOLOGY EXAM  09/22/2020  . TETANUS/TDAP  04/10/2025  . COLONOSCOPY  11/15/2028  . INFLUENZA VACCINE  Completed  . Hepatitis C Screening  Completed  . PNA vac Low Risk Adult  Completed     ----------------------------------------------------------------------------------------------------------------------------------------------------------------------------------------------------------------- Physical Exam BP (!) 146/86 (BP Location: Left Arm, Patient Position: Sitting, Cuff Size: Large)   Pulse 87   Temp 98.3 F (36.8 C)   Wt 243 lb 3.2 oz (110.3 kg)   SpO2 100%   BMI 31.23 kg/m   Physical Exam Constitutional:      Appearance: Normal appearance.  Musculoskeletal:     Comments: R hand with swelling, mild redness and warmth. Joints of hands tender.   Neurological:     General: No focal deficit present.     Mental Status: He is alert.  Psychiatric:        Mood and Affect: Mood normal.        Behavior: Behavior normal.     ------------------------------------------------------------------------------------------------------------------------------------------------------------------------------------------------------------------- Assessment and Plan  Hydroureteronephrosis Has upcoming appt with urology.  Continues to have urinary frequency.  UA with blood, sent for culture.   Gout of right hand Will avoid NSAIDS and colchicine due to current renal function and obstruction .  Start prednisone 50mg  daily for up to 10 days.  Given handout on low purine diet.  Will plant to check uric acid once resolved.   Instructed to  follow up if not improving.     Meds ordered this encounter  Medications  . predniSONE (DELTASONE) 50 MG tablet    Sig: Take daily until resolution of gout symptoms.    Dispense:  10 tablet    Refill:  0    No follow-ups on file.    This visit occurred during the SARS-CoV-2 public health emergency.  Safety protocols were in place, including screening questions prior to the visit, additional usage of staff PPE, and extensive cleaning of exam room while observing appropriate contact time as indicated for disinfecting solutions.

## 2020-02-24 DIAGNOSIS — Z8546 Personal history of malignant neoplasm of prostate: Secondary | ICD-10-CM | POA: Diagnosis not present

## 2020-02-24 DIAGNOSIS — N200 Calculus of kidney: Secondary | ICD-10-CM | POA: Diagnosis not present

## 2020-03-01 ENCOUNTER — Emergency Department (HOSPITAL_COMMUNITY)
Admission: EM | Admit: 2020-03-01 | Discharge: 2020-03-01 | Disposition: A | Discharge status: Home / Self Care | Payer: HMO | Source: Home / Self Care | Attending: Emergency Medicine | Admitting: Emergency Medicine

## 2020-03-01 ENCOUNTER — Emergency Department (HOSPITAL_COMMUNITY): Payer: HMO

## 2020-03-01 ENCOUNTER — Other Ambulatory Visit: Payer: Self-pay

## 2020-03-01 ENCOUNTER — Encounter (HOSPITAL_COMMUNITY): Payer: Self-pay | Admitting: Emergency Medicine

## 2020-03-01 DIAGNOSIS — C61 Malignant neoplasm of prostate: Secondary | ICD-10-CM | POA: Diagnosis not present

## 2020-03-01 DIAGNOSIS — E11649 Type 2 diabetes mellitus with hypoglycemia without coma: Secondary | ICD-10-CM | POA: Diagnosis not present

## 2020-03-01 DIAGNOSIS — R600 Localized edema: Secondary | ICD-10-CM | POA: Diagnosis not present

## 2020-03-01 DIAGNOSIS — Y831 Surgical operation with implant of artificial internal device as the cause of abnormal reaction of the patient, or of later complication, without mention of misadventure at the time of the procedure: Secondary | ICD-10-CM | POA: Diagnosis present

## 2020-03-01 DIAGNOSIS — Z820 Family history of epilepsy and other diseases of the nervous system: Secondary | ICD-10-CM | POA: Diagnosis not present

## 2020-03-01 DIAGNOSIS — I4892 Unspecified atrial flutter: Secondary | ICD-10-CM

## 2020-03-01 DIAGNOSIS — E1165 Type 2 diabetes mellitus with hyperglycemia: Secondary | ICD-10-CM | POA: Diagnosis not present

## 2020-03-01 DIAGNOSIS — T8389XA Other specified complication of genitourinary prosthetic devices, implants and grafts, initial encounter: Secondary | ICD-10-CM | POA: Diagnosis not present

## 2020-03-01 DIAGNOSIS — K567 Ileus, unspecified: Secondary | ICD-10-CM | POA: Diagnosis not present

## 2020-03-01 DIAGNOSIS — M7989 Other specified soft tissue disorders: Secondary | ICD-10-CM | POA: Diagnosis not present

## 2020-03-01 DIAGNOSIS — T8459XA Infection and inflammatory reaction due to other internal joint prosthesis, initial encounter: Secondary | ICD-10-CM | POA: Diagnosis not present

## 2020-03-01 DIAGNOSIS — K3189 Other diseases of stomach and duodenum: Secondary | ICD-10-CM | POA: Diagnosis not present

## 2020-03-01 DIAGNOSIS — R7881 Bacteremia: Secondary | ICD-10-CM | POA: Diagnosis not present

## 2020-03-01 DIAGNOSIS — Z96652 Presence of left artificial knee joint: Secondary | ICD-10-CM | POA: Insufficient documentation

## 2020-03-01 DIAGNOSIS — K219 Gastro-esophageal reflux disease without esophagitis: Secondary | ICD-10-CM | POA: Diagnosis present

## 2020-03-01 DIAGNOSIS — Z96643 Presence of artificial hip joint, bilateral: Secondary | ICD-10-CM | POA: Diagnosis present

## 2020-03-01 DIAGNOSIS — R609 Edema, unspecified: Secondary | ICD-10-CM | POA: Diagnosis not present

## 2020-03-01 DIAGNOSIS — Z7409 Other reduced mobility: Secondary | ICD-10-CM | POA: Diagnosis not present

## 2020-03-01 DIAGNOSIS — M25452 Effusion, left hip: Secondary | ICD-10-CM | POA: Diagnosis not present

## 2020-03-01 DIAGNOSIS — Z87891 Personal history of nicotine dependence: Secondary | ICD-10-CM | POA: Insufficient documentation

## 2020-03-01 DIAGNOSIS — N201 Calculus of ureter: Secondary | ICD-10-CM | POA: Diagnosis present

## 2020-03-01 DIAGNOSIS — N10 Acute tubulo-interstitial nephritis: Secondary | ICD-10-CM | POA: Diagnosis not present

## 2020-03-01 DIAGNOSIS — K429 Umbilical hernia without obstruction or gangrene: Secondary | ICD-10-CM | POA: Diagnosis not present

## 2020-03-01 DIAGNOSIS — M25559 Pain in unspecified hip: Secondary | ICD-10-CM | POA: Diagnosis not present

## 2020-03-01 DIAGNOSIS — Z7401 Bed confinement status: Secondary | ICD-10-CM | POA: Diagnosis not present

## 2020-03-01 DIAGNOSIS — N1832 Chronic kidney disease, stage 3b: Secondary | ICD-10-CM | POA: Diagnosis not present

## 2020-03-01 DIAGNOSIS — D500 Iron deficiency anemia secondary to blood loss (chronic): Secondary | ICD-10-CM | POA: Diagnosis not present

## 2020-03-01 DIAGNOSIS — Z87442 Personal history of urinary calculi: Secondary | ICD-10-CM | POA: Diagnosis not present

## 2020-03-01 DIAGNOSIS — N179 Acute kidney failure, unspecified: Secondary | ICD-10-CM | POA: Insufficient documentation

## 2020-03-01 DIAGNOSIS — E1122 Type 2 diabetes mellitus with diabetic chronic kidney disease: Secondary | ICD-10-CM | POA: Diagnosis not present

## 2020-03-01 DIAGNOSIS — M169 Osteoarthritis of hip, unspecified: Secondary | ICD-10-CM | POA: Diagnosis not present

## 2020-03-01 DIAGNOSIS — I480 Paroxysmal atrial fibrillation: Secondary | ICD-10-CM | POA: Diagnosis not present

## 2020-03-01 DIAGNOSIS — K6389 Other specified diseases of intestine: Secondary | ICD-10-CM | POA: Diagnosis not present

## 2020-03-01 DIAGNOSIS — Z452 Encounter for adjustment and management of vascular access device: Secondary | ICD-10-CM | POA: Diagnosis not present

## 2020-03-01 DIAGNOSIS — R278 Other lack of coordination: Secondary | ICD-10-CM | POA: Diagnosis not present

## 2020-03-01 DIAGNOSIS — I2510 Atherosclerotic heart disease of native coronary artery without angina pectoris: Secondary | ICD-10-CM | POA: Diagnosis not present

## 2020-03-01 DIAGNOSIS — Z20822 Contact with and (suspected) exposure to covid-19: Secondary | ICD-10-CM | POA: Insufficient documentation

## 2020-03-01 DIAGNOSIS — Z978 Presence of other specified devices: Secondary | ICD-10-CM | POA: Diagnosis not present

## 2020-03-01 DIAGNOSIS — Z96649 Presence of unspecified artificial hip joint: Secondary | ICD-10-CM | POA: Diagnosis not present

## 2020-03-01 DIAGNOSIS — J45909 Unspecified asthma, uncomplicated: Secondary | ICD-10-CM | POA: Insufficient documentation

## 2020-03-01 DIAGNOSIS — R2689 Other abnormalities of gait and mobility: Secondary | ICD-10-CM | POA: Diagnosis not present

## 2020-03-01 DIAGNOSIS — E872 Acidosis: Secondary | ICD-10-CM | POA: Diagnosis not present

## 2020-03-01 DIAGNOSIS — N132 Hydronephrosis with renal and ureteral calculous obstruction: Secondary | ICD-10-CM | POA: Diagnosis not present

## 2020-03-01 DIAGNOSIS — I10 Essential (primary) hypertension: Secondary | ICD-10-CM | POA: Diagnosis not present

## 2020-03-01 DIAGNOSIS — M25552 Pain in left hip: Secondary | ICD-10-CM | POA: Diagnosis present

## 2020-03-01 DIAGNOSIS — R5381 Other malaise: Secondary | ICD-10-CM | POA: Diagnosis not present

## 2020-03-01 DIAGNOSIS — Z7984 Long term (current) use of oral hypoglycemic drugs: Secondary | ICD-10-CM | POA: Diagnosis not present

## 2020-03-01 DIAGNOSIS — K56609 Unspecified intestinal obstruction, unspecified as to partial versus complete obstruction: Secondary | ICD-10-CM | POA: Diagnosis not present

## 2020-03-01 DIAGNOSIS — Z4682 Encounter for fitting and adjustment of non-vascular catheter: Secondary | ICD-10-CM | POA: Diagnosis not present

## 2020-03-01 DIAGNOSIS — T8451XA Infection and inflammatory reaction due to internal right hip prosthesis, initial encounter: Secondary | ICD-10-CM | POA: Diagnosis not present

## 2020-03-01 DIAGNOSIS — I4891 Unspecified atrial fibrillation: Secondary | ICD-10-CM | POA: Diagnosis not present

## 2020-03-01 DIAGNOSIS — I252 Old myocardial infarction: Secondary | ICD-10-CM | POA: Diagnosis not present

## 2020-03-01 DIAGNOSIS — R1111 Vomiting without nausea: Secondary | ICD-10-CM | POA: Diagnosis not present

## 2020-03-01 DIAGNOSIS — T8452XA Infection and inflammatory reaction due to internal left hip prosthesis, initial encounter: Secondary | ICD-10-CM | POA: Diagnosis present

## 2020-03-01 DIAGNOSIS — Z96642 Presence of left artificial hip joint: Secondary | ICD-10-CM | POA: Diagnosis not present

## 2020-03-01 DIAGNOSIS — M00852 Arthritis due to other bacteria, left hip: Secondary | ICD-10-CM | POA: Diagnosis not present

## 2020-03-01 DIAGNOSIS — N3000 Acute cystitis without hematuria: Secondary | ICD-10-CM | POA: Diagnosis not present

## 2020-03-01 DIAGNOSIS — Z79899 Other long term (current) drug therapy: Secondary | ICD-10-CM | POA: Insufficient documentation

## 2020-03-01 DIAGNOSIS — Z7982 Long term (current) use of aspirin: Secondary | ICD-10-CM | POA: Insufficient documentation

## 2020-03-01 DIAGNOSIS — Z8042 Family history of malignant neoplasm of prostate: Secondary | ICD-10-CM | POA: Diagnosis not present

## 2020-03-01 DIAGNOSIS — N183 Chronic kidney disease, stage 3 unspecified: Secondary | ICD-10-CM | POA: Diagnosis not present

## 2020-03-01 DIAGNOSIS — E785 Hyperlipidemia, unspecified: Secondary | ICD-10-CM | POA: Diagnosis present

## 2020-03-01 DIAGNOSIS — Z8546 Personal history of malignant neoplasm of prostate: Secondary | ICD-10-CM | POA: Diagnosis not present

## 2020-03-01 DIAGNOSIS — Z803 Family history of malignant neoplasm of breast: Secondary | ICD-10-CM | POA: Diagnosis not present

## 2020-03-01 DIAGNOSIS — E119 Type 2 diabetes mellitus without complications: Secondary | ICD-10-CM | POA: Diagnosis not present

## 2020-03-01 DIAGNOSIS — S7012XD Contusion of left thigh, subsequent encounter: Secondary | ICD-10-CM | POA: Diagnosis not present

## 2020-03-01 DIAGNOSIS — Z471 Aftercare following joint replacement surgery: Secondary | ICD-10-CM | POA: Diagnosis not present

## 2020-03-01 DIAGNOSIS — M47816 Spondylosis without myelopathy or radiculopathy, lumbar region: Secondary | ICD-10-CM | POA: Diagnosis not present

## 2020-03-01 DIAGNOSIS — K5669 Other partial intestinal obstruction: Secondary | ICD-10-CM | POA: Diagnosis not present

## 2020-03-01 DIAGNOSIS — T8459XD Infection and inflammatory reaction due to other internal joint prosthesis, subsequent encounter: Secondary | ICD-10-CM | POA: Diagnosis not present

## 2020-03-01 DIAGNOSIS — M2550 Pain in unspecified joint: Secondary | ICD-10-CM | POA: Diagnosis not present

## 2020-03-01 DIAGNOSIS — Z01818 Encounter for other preprocedural examination: Secondary | ICD-10-CM | POA: Diagnosis not present

## 2020-03-01 DIAGNOSIS — B9689 Other specified bacterial agents as the cause of diseases classified elsewhere: Secondary | ICD-10-CM | POA: Diagnosis not present

## 2020-03-01 DIAGNOSIS — D62 Acute posthemorrhagic anemia: Secondary | ICD-10-CM | POA: Diagnosis not present

## 2020-03-01 DIAGNOSIS — L02419 Cutaneous abscess of limb, unspecified: Secondary | ICD-10-CM | POA: Diagnosis not present

## 2020-03-01 DIAGNOSIS — B952 Enterococcus as the cause of diseases classified elsewhere: Secondary | ICD-10-CM | POA: Diagnosis not present

## 2020-03-01 DIAGNOSIS — L7632 Postprocedural hematoma of skin and subcutaneous tissue following other procedure: Secondary | ICD-10-CM | POA: Diagnosis not present

## 2020-03-01 DIAGNOSIS — R2242 Localized swelling, mass and lump, left lower limb: Secondary | ICD-10-CM | POA: Diagnosis not present

## 2020-03-01 DIAGNOSIS — Y839 Surgical procedure, unspecified as the cause of abnormal reaction of the patient, or of later complication, without mention of misadventure at the time of the procedure: Secondary | ICD-10-CM | POA: Diagnosis not present

## 2020-03-01 DIAGNOSIS — I129 Hypertensive chronic kidney disease with stage 1 through stage 4 chronic kidney disease, or unspecified chronic kidney disease: Secondary | ICD-10-CM | POA: Diagnosis present

## 2020-03-01 DIAGNOSIS — L02416 Cutaneous abscess of left lower limb: Secondary | ICD-10-CM | POA: Diagnosis present

## 2020-03-01 DIAGNOSIS — Z741 Need for assistance with personal care: Secondary | ICD-10-CM | POA: Diagnosis not present

## 2020-03-01 DIAGNOSIS — R140 Abdominal distension (gaseous): Secondary | ICD-10-CM | POA: Diagnosis not present

## 2020-03-01 DIAGNOSIS — M109 Gout, unspecified: Secondary | ICD-10-CM | POA: Diagnosis present

## 2020-03-01 DIAGNOSIS — K469 Unspecified abdominal hernia without obstruction or gangrene: Secondary | ICD-10-CM | POA: Diagnosis not present

## 2020-03-01 LAB — CBC WITH DIFFERENTIAL/PLATELET
Abs Immature Granulocytes: 0.08 10*3/uL — ABNORMAL HIGH (ref 0.00–0.07)
Basophils Absolute: 0 10*3/uL (ref 0.0–0.1)
Basophils Relative: 0 %
Eosinophils Absolute: 0 10*3/uL (ref 0.0–0.5)
Eosinophils Relative: 0 %
HCT: 26.3 % — ABNORMAL LOW (ref 39.0–52.0)
Hemoglobin: 8.6 g/dL — ABNORMAL LOW (ref 13.0–17.0)
Immature Granulocytes: 1 %
Lymphocytes Relative: 5 %
Lymphs Abs: 0.5 10*3/uL — ABNORMAL LOW (ref 0.7–4.0)
MCH: 30.4 pg (ref 26.0–34.0)
MCHC: 32.7 g/dL (ref 30.0–36.0)
MCV: 92.9 fL (ref 80.0–100.0)
Monocytes Absolute: 1.1 10*3/uL — ABNORMAL HIGH (ref 0.1–1.0)
Monocytes Relative: 11 %
Neutro Abs: 8.5 10*3/uL — ABNORMAL HIGH (ref 1.7–7.7)
Neutrophils Relative %: 83 %
Platelets: 414 10*3/uL — ABNORMAL HIGH (ref 150–400)
RBC: 2.83 MIL/uL — ABNORMAL LOW (ref 4.22–5.81)
RDW: 14.1 % (ref 11.5–15.5)
WBC: 10.1 10*3/uL (ref 4.0–10.5)
nRBC: 0 % (ref 0.0–0.2)

## 2020-03-01 LAB — RESP PANEL BY RT-PCR (FLU A&B, COVID) ARPGX2
Influenza A by PCR: NEGATIVE
Influenza B by PCR: NEGATIVE
SARS Coronavirus 2 by RT PCR: NEGATIVE

## 2020-03-01 LAB — BASIC METABOLIC PANEL
Anion gap: 9 (ref 5–15)
BUN: 59 mg/dL — ABNORMAL HIGH (ref 8–23)
CO2: 18 mmol/L — ABNORMAL LOW (ref 22–32)
Calcium: 7.9 mg/dL — ABNORMAL LOW (ref 8.9–10.3)
Chloride: 106 mmol/L (ref 98–111)
Creatinine, Ser: 2.17 mg/dL — ABNORMAL HIGH (ref 0.61–1.24)
GFR, Estimated: 31 mL/min — ABNORMAL LOW (ref >60)
Glucose, Bld: 209 mg/dL — ABNORMAL HIGH (ref 70–99)
Potassium: 4.1 mmol/L (ref 3.5–5.1)
Sodium: 133 mmol/L — ABNORMAL LOW (ref 135–145)

## 2020-03-01 LAB — SEDIMENTATION RATE: Sed Rate: >140 mm/hr — ABNORMAL HIGH (ref 0–16)

## 2020-03-01 LAB — C-REACTIVE PROTEIN: CRP: 21.3 mg/dL — ABNORMAL HIGH (ref <1.0)

## 2020-03-01 MED ORDER — KETOROLAC TROMETHAMINE 15 MG/ML IJ SOLN
15.0000 mg | Freq: Once | INTRAMUSCULAR | Status: AC
  Administered 2020-03-01: 15 mg via INTRAMUSCULAR
  Filled 2020-03-01: qty 1

## 2020-03-01 MED ORDER — ACETAMINOPHEN 500 MG PO TABS
1000.0000 mg | ORAL_TABLET | Freq: Once | ORAL | Status: AC
  Administered 2020-03-01: 1000 mg via ORAL
  Filled 2020-03-01: qty 2

## 2020-03-01 MED ORDER — DICLOFENAC SODIUM 1 % EX GEL: 4.0000 g | Freq: Four times a day (QID) | CUTANEOUS | 0 refills | Status: DC

## 2020-03-01 MED ORDER — OXYCODONE HCL 5 MG PO TABS
5.0000 mg | ORAL_TABLET | Freq: Once | ORAL | Status: AC
  Administered 2020-03-01: 5 mg via ORAL
  Filled 2020-03-01: qty 1

## 2020-03-01 MED ORDER — MORPHINE SULFATE 15 MG PO TABS
7.5000 mg | ORAL_TABLET | ORAL | 0 refills | Status: DC | PRN
Start: 2020-03-01 — End: 2020-03-21

## 2020-03-01 NOTE — ED Triage Notes (Signed)
Patient here from home reporting bilateral hip pain radiating down into knee that started Monday of this week. Denies injury.

## 2020-03-01 NOTE — ED Notes (Signed)
Pt noted to be in a-flutter on cardiac monitor, EDP Tyrone Nine made aware.  Verbal order for EKG given.

## 2020-03-01 NOTE — ED Provider Notes (Addendum)
[name] DEPT Provider Note   CSN: [id number] Arrival date & time: 03/01/20  0747     History Chief Complaint  Patient presents with  . Hip Pain  . Knee Pain    [name] is a 74 y.o. male.  74 yo M with a cc of L hip pain.  Going on for the past week.  Denies trauma.  Denies back pain, denies numbness on tingling to the leg.  Prior hip replacement.   The history is provided by the patient.  Hip Pain This is a new problem. The current episode started more than 2 days ago. The problem occurs constantly. The problem has not changed since onset.Pertinent negatives include no chest pain, no abdominal pain, no headaches and no shortness of breath. The symptoms are aggravated by bending and twisting. Nothing relieves the symptoms. He has tried nothing for the symptoms. The treatment provided no relief.  Knee Pain Associated symptoms: no fever        Past Medical History:  Diagnosis Date  . Arthritis   . Asthma    no inhaler  . Coronary artery disease    cardiologist-  dr spruill; last visit 3 mos ago per pt  . GERD (gastroesophageal reflux disease)   . History of MI (myocardial infarction)    1985  . Hydronephrosis, left   . Hypertension   . Myocardial infarction ([place])   . Nephrolithiasis    left  . 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 Presence of tooth-root and mandibular implants    lower dental implants  . Prostate cancer ([place])   . Shortness of breath    WITH EXERTION  . Type 2 diabetes mellitus [facility])     Patient Active Problem List   Diagnosis Date Noted  . Gout of right hand 02/23/2020  . AKI (acute kidney injury) ([place]) 01/14/2020  . Hyperkalemia 01/14/2020  . Hydroureteronephrosis 01/14/2020  . Colon cancer screening 04/28/2019  . Intermittent claudication ([place]) 01/22/2019  . GERD (gastroesophageal reflux disease) 10/23/2018  . Pseudophakia 08/11/2016  . Uncontrolled type 2 diabetes mellitus  with hyperglycemia, without long-term current use of insulin ([place]) 06/26/2016  . Hyperlipidemia with target LDL less than 70 06/26/2016  . Angina pectoris ([place]) 06/26/2016  . Type 2 diabetes mellitus with hypoglycemia without coma ([place]) 06/24/2016  . Essential hypertension 06/24/2016  . CAD (coronary artery disease) 06/24/2016  . Asthma 06/24/2016  . Malignant neoplasm of prostate ([place]) 12/28/2015  . OA (osteoarthritis) of hip 04/09/2012    Past Surgical History:  Procedure Laterality Date  . BUNIONECTOMY    . CYSTOSCOPY W/ RETROGRADES Left 03/14/2014   Procedure: CYSTOSCOPY WITH LEFT RETROGRADE PYELOGRAM, Left Ureteroscopy, [place] ureteral Stent No string;  Surgeon: [name], MD;  [facility];  Service: Urology;  Laterality: Left;  . CYSTOSCOPY W/ URETERAL STENT PLACEMENT Bilateral 01/15/2020   Procedure: CYSTOSCOPY WITH RETROGRADE PYELOGRAM/URETERAL STENT PLACEMENT;  Surgeon: Alexis Frock, MD;  Location: [facility];  Service: Urology;  Laterality: Bilateral;  . HERNIA REPAIR    . PROSTATE BIOPSY    . TOTAL HIP ARTHROPLASTY Left 03-20-2009  . TOTAL HIP ARTHROPLASTY  04/09/2012   Procedure: TOTAL HIP ARTHROPLASTY;  Surgeon: [name], MD;  Location: [facility];  Service: Orthopedics;  Laterality: Right;  . TOTAL KNEE ARTHROPLASTY Left 05-16-2008       Family History  Problem Relation Age of Onset  . Cancer Mother  breast  . Multiple sclerosis Daughter   . Cancer Father        prostate  . Cancer Maternal Uncle        bone    Social History   Tobacco Use  . Smoking status: Former Smoker    Packs/day: 1.00    Years: 25.00    Pack years: 25.00    Types: Cigarettes    Quit date: 04/11/1984    Years since quitting: 35.9  . Smokeless tobacco: Former Systems developer    Quit date: 04/02/1985  Vaping Use  . Vaping Use: Never used  Substance Use Topics  . Alcohol use: Yes    Comment: RARE  . Drug use: No    Home Medications Prior to Admission  medications   Medication Sig Start Date End Date Taking? Authorizing Provider  albuterol (VENTOLIN HFA) 108 (90 Base) MCG/ACT inhaler Inhale 2 puffs into the lungs every 6 (six) hours as needed for wheezing or shortness of breath. 08/09/19 03/01/20 Yes Luetta Nutting, DO  amLODipine (NORVASC) 10 MG tablet Take 10 mg by mouth daily.   Yes [provider]  Aspirin 81 MG CAPS Take 81 mg by mouth daily.    Yes [provider]  atorvastatin (LIPITOR) 40 MG tablet Take 1 tablet (40 mg total) by mouth daily. 06/26/16  Yes Tat, Shanon Brow, MD  carvedilol (COREG) 25 MG tablet Take 25 mg by mouth 2 (two) times daily with a meal.   Yes [provider]  co-enzyme Q-10 30 MG capsule Take 30 mg by mouth daily.   Yes [provider]  cycloSPORINE (RESTASIS) 0.05 % ophthalmic emulsion Place 1 drop into both eyes 2 (two) times daily.  09/23/19  Yes [provider]  docusate sodium (COLACE) 100 MG capsule Take 100 mg by mouth daily as needed for mild constipation.   Yes [provider]  ferrous sulfate 325 (65 FE) MG tablet Take 325 mg by mouth daily with breakfast.   Yes [provider]  gabapentin (NEURONTIN) 300 MG capsule Take 1 capsule (300 mg total) by mouth 3 (three) times daily. Start 300mg  at bedtime x1 week then may increase to three times per day. Patient taking differently: Take 300 mg by mouth See admin instructions. 300mg  at bedtime  300mg  bid prn neuropathy 10/21/19  Yes Luetta Nutting, DO  glimepiride (AMARYL) 4 MG tablet Take 4 mg by mouth daily before breakfast.   Yes [provider]  sitaGLIPtin (JANUVIA) 50 MG tablet Take 1 tablet (50 mg total) by mouth daily. 06/26/16  Yes Tat, Shanon Brow, MD  Continuous Blood Gluc Receiver (FREESTYLE LIBRE 14 DAY READER) [place] See admin instructions. 05/23/19   [provider]  Continuous Blood Gluc Sensor (FREESTYLE LIBRE 14 DAY SENSOR) MISC 1 Device by Does not apply route every 14 (fourteen)  days. 05/23/19   Luetta Nutting, DO  diclofenac Sodium (VOLTAREN) 1 % GEL Apply 4 g topically 4 (four) times daily. 03/01/20   Deno Etienne, DO  morphine (MSIR) 15 MG tablet Take 0.5 tablets (7.5 mg total) by mouth every 4 (four) hours as needed for severe pain. 03/01/20   Deno Etienne, DO  omeprazole (PRILOSEC) 40 MG capsule Take 1 capsule (40 mg total) by mouth 2 (two) times daily. X 1 month, then once daily Patient taking differently: Take 40 mg by mouth daily.  06/26/16   Orson Eva, MD  predniSONE (DELTASONE) 50 MG tablet Take daily until resolution of gout symptoms. 02/21/20   Luetta Nutting, DO  Allergies    Shellfish allergy  Review of Systems   Review of Systems  Constitutional: Negative for chills and fever.  HENT: Negative for congestion and facial swelling.   Eyes: Negative for discharge and visual disturbance.  Respiratory: Negative for cough and shortness of breath.   Cardiovascular: Negative for chest pain and palpitations.  Gastrointestinal: Negative for abdominal pain, diarrhea and vomiting.  Musculoskeletal: Negative for arthralgias and myalgias.  Skin: Negative for color change and rash.  Neurological: Negative for tremors, syncope and headaches.  Psychiatric/Behavioral: Negative for confusion and dysphoric mood.    Physical Exam Updated Vital Signs BP 124/64 (BP Location: Left Arm)   Pulse 96   Temp 98 F (36.7 C) (Oral)   Resp 15   SpO2 99%   Physical Exam Vitals and nursing note reviewed.  Constitutional:      Appearance: He is well-developed.  HENT:     Head: Normocephalic and atraumatic.  Eyes:     Pupils: Pupils are equal, round, and reactive to light.  Neck:     Vascular: No JVD.  Cardiovascular:     Rate and Rhythm: Normal rate and regular rhythm.     Heart sounds: No murmur heard.  No friction rub. No gallop.   Pulmonary:     Effort: No respiratory distress.     Breath sounds: No wheezing.  Abdominal:     General: There is no distension.      Tenderness: There is no abdominal tenderness. There is no guarding or rebound.  Musculoskeletal:        General: Swelling and tenderness present. Normal range of motion.     Cervical back: Normal range of motion and neck supple.     Comments: Tenderness and swelling to the L greater trochanter.    Pulse and motor intact distally.  No sensation along the lateral aspect of the foot.   Skin:    Coloration: Skin is not pale.     Findings: No rash.  Neurological:     Mental Status: He is alert and oriented to person, place, and time.  Psychiatric:        Behavior: Behavior normal.     ED Results / Procedures / Treatments   Labs (all labs ordered are listed, but only abnormal results are displayed) Labs Reviewed  CBC WITH DIFFERENTIAL/PLATELET - Abnormal; Notable for the following components:      Result Value   RBC 2.83 (*)    Hemoglobin 8.6 (*)    HCT 26.3 (*)    Platelets 414 (*)    Neutro Abs 8.5 (*)    Lymphs Abs 0.5 (*)    Monocytes Absolute 1.1 (*)    Abs Immature Granulocytes 0.08 (*)    All other components within normal limits  BASIC METABOLIC PANEL - Abnormal; Notable for the following components:   Sodium 133 (*)    CO2 18 (*)    Glucose, Bld 209 (*)    BUN 59 (*)    Creatinine, Ser 2.17 (*)    Calcium 7.9 (*)    GFR, Estimated 31 (*)    All other components within normal limits  SEDIMENTATION RATE - Abnormal; Notable for the following components:   Sed Rate >140 (*)    All other components within normal limits  C-REACTIVE PROTEIN - Abnormal; Notable for the following components:   CRP 21.3 (*)    All other components within normal limits  RESP PANEL BY RT-PCR (FLU A&B, COVID) ARPGX2  MISC  [facility] TEST (SEND OUT)  MISC [facility] TEST (SEND OUT)  I-STAT CHEM 8, ED    EKG EKG Interpretation  Date/Time:  Sunday March 01 2020 12:25:33 EST Ventricular Rate:  79 PR Interval:    QRS Duration: 77 QT Interval:  377 QTC Calculation: 433 R Axis:   55 Text  Interpretation: Atrial flutter with predominant 4:1 AV block Ventricular bigeminy ST elevation, consider inferior injury Otherwise no significant change Confirmed by Deno Etienne [phone]) on 03/01/2020 12:33:55 PM   Radiology CT HIP LEFT WO CONTRAST  Result Date: 03/01/2020 CLINICAL DATA:  Left hip pain. EXAM: CT OF THE LEFT HIP WITHOUT CONTRAST TECHNIQUE: Multidetector CT imaging of the left hip was performed according to the standard protocol. Multiplanar CT image reconstructions were also generated. COMPARISON:  X-ray 03/01/2020 FINDINGS: Bones/Joint/Cartilage Status post left total hip arthroplasty. Arthroplasty components are in their expected alignment. No evidence of periprosthetic fracture. Large fluid and air containing collection surrounding the proximal femoral component measuring approximately 8.7 cm in AP dimension (series 4, image 61) by 6.7 cm in craniocaudal dimension (series 9, image 94). Largest fluid component is along the lateral aspect of the proximal femur. Air and fluid track medially within the adductor muscle compartment extending to the level of the left pubic tubercle (series 4, images 74-79). Possible erosion along the anterior aspect of the proximal native femur with air seen within bone (series 3, image 68). Periprosthetic lucency along the posterior cortex of the proximal femoral stem extending distally by approximately 7.5 cm (series 8, image 59). Ligaments Suboptimally assessed by CT. Muscles and Tendons Poorly defined fluid and air collection within the left adductor muscular compartment. Soft tissues Remaining soft tissues within normal limits. IMPRESSION: 1. Status post left total hip arthroplasty. Large fluid and air containing collection surrounding the proximal femoral component measuring up to 8.7 cm, concerning for infection/abscess. Fluid and air collection track within the left adductor muscle compartment extending towards the symphysis. Orthopedic consultation and  arthrocentesis recommended. 2. Periprosthetic lucency along the posterior cortex of the proximal femoral stem extending distally by approximately 7.5 cm, concerning for septic loosening. 3. Additional area of lucency along the anterior aspect of the proximal native femur with air seen within bone, concerning for osteomyelitis. Electronically Signed   By: Davina Poke D.O.   On: 03/01/2020 11:10   DG Hip Unilat W or Wo Pelvis 2-3 Views Left  Result Date: 03/01/2020 CLINICAL DATA:  Left hip pain radiating down left leg to knee 1 week. EXAM: DG HIP (WITH OR WITHOUT PELVIS) 2-3V LEFT COMPARISON:  Abdominal films 12/05/2019 FINDINGS: Examination demonstrates bilateral hip arthroplasties intact and unchanged. Minimal air in the soft tissues lateral to the left hip. Known bilateral ureteral stents are present as inferior aspect of the stents come together in the midline of the pelvis extending into the region of the trigone. Moderate degenerate change of the spine. No acute fracture or dislocation. IMPRESSION: 1. Bilateral hip arthroplasties intact and unchanged. No acute fracture or dislocation. Suggestion of mild air over the soft tissues lateral to the left hip as recommend clinical correlation with recent intervention. Soft tissue infection could produce this appearance. 2. Known bilateral ureteral stents as described. Electronically Signed   By: Marin Olp M.D.   On: 03/01/2020 09:21    Procedures Procedures (including critical care time)  Medications Ordered in ED Medications  acetaminophen (TYLENOL) tablet 1,000 mg (1,000 mg Oral Given 03/01/20 0829)  ketorolac (TORADOL) 15 MG/ML injection 15 mg (15 mg Intramuscular Given  03/01/20 0829)  oxyCODONE (Oxy IR/ROXICODONE) immediate release tablet 5 mg (5 mg Oral Given 03/01/20 3532)    ED Course  I have reviewed the triage vital signs and the nursing notes.  Pertinent labs & imaging results that were available during my care of the patient  were reviewed by me and considered in my medical decision making (see chart for details).    MDM Rules/Calculators/A&P                          74 yo M with a cc of L hip pain.  No trauma.  Hip replacement prior.  Pain and swelling, will start with xray, pain meds.  Reassess.   Plain film viewed by me without dislocation or periprosthetic fracture. There was some irregularity to the soft tissue laterally, read by radiology as possible signs of soft tissue infection. CT scan consistent with a fairly large fluid collection. Will discuss with Ortho.  Discussed with Dr. 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  Recommended obtaining a serum chromium and cobalt level.  Recent a/c in sep 7.1.  Recommended following up in the office tomorrow.  incidentally noted to be in aflutter.  Asymptomatic, rate controlled.  On coreg.  Have follow up in afib clinic.   CHA2DS2/VAS Stroke Risk Points      N/A >= 2 Points: High Risk  1 - 1.99 Points: Medium Risk  0 Points: Low Risk    Last Change: N/A      This score determines the patient's risk of having a stroke if the  patient has atrial fibrillation.      This score is not applicable to this patient. Components are not  calculated.     1:38 PM:  I have discussed the diagnosis/risks/treatment options with the patient and believe the pt to be eligible for discharge home to follow-up with Ortho. We also discussed returning to the ED immediately if new or worsening sx occur. We discussed the sx which are most concerning (e.g., sudden worsening pain, fever, inability to tolerate by mouth) that necessitate immediate return. Medications administered to the patient during their visit and any new prescriptions provided to the patient are listed below.  Medications given during this visit Medications  acetaminophen (TYLENOL) tablet 1,000 mg (1,000 mg Oral Given 03/01/20 0829)  ketorolac (TORADOL) 15 MG/ML injection 15 mg  (15 mg Intramuscular Given 03/01/20 0829)  oxyCODONE (Oxy IR/ROXICODONE) immediate release tablet 5 mg (5 mg Oral Given 03/01/20 9924)     The patient appears reasonably screen and/or stabilized for discharge and I doubt any other medical condition or other [facility] requiring further screening, evaluation, or treatment in the ED at this time prior to discharge.     Final Clinical Impression(s) / ED Diagnoses Final diagnoses:  Effusion of left hip  Atrial flutter, unspecified type ([place])    Rx / DC Orders ED Discharge Orders         Ordered    diclofenac Sodium (VOLTAREN) 1 % GEL  4 times daily        03/01/20 1323    morphine (MSIR) 15 MG tablet  Every 4 hours PRN        03/01/20 1323           Deno Etienne, DO 03/01/20 [place], [place], DO 03/01/20 1338

## 2020-03-01 NOTE — Discharge Instructions (Signed)
Take tylenol 1000mg(2 extra strength) four times a day.  ° °Then take the pain medicine if you feel like you need it. Narcotics do not help with the pain, they only make you care about it less.  You can become addicted to this, people may break into your house to steal it.  It will constipate you.  If you drive under the influence of this medicine you can get a DUI.   ° °

## 2020-03-02 ENCOUNTER — Telehealth (HOSPITAL_COMMUNITY): Payer: Self-pay | Admitting: Nurse Practitioner

## 2020-03-02 NOTE — Telephone Encounter (Signed)
We received a Referral as patient was seen at [facility] on 03/01/20 with new diagnosis of aflutter.  Called patient to schedule appt and he states he is a patient of Dr. [name].  Pt declined appt at [facility] and states he will call Dr. [name] today to schedule an appt.

## 2020-03-03 ENCOUNTER — Inpatient Hospital Stay (HOSPITAL_COMMUNITY)
Admission: AD | Admit: 2020-03-03 | Discharge: 2020-03-21 | DRG: 467 | Disposition: A | Discharge status: Skilled Nursing Facility | Payer: HMO | Source: Ambulatory Visit | Attending: Internal Medicine | Admitting: Internal Medicine

## 2020-03-03 ENCOUNTER — Other Ambulatory Visit: Payer: Self-pay

## 2020-03-03 ENCOUNTER — Observation Stay (HOSPITAL_COMMUNITY): Payer: HMO

## 2020-03-03 ENCOUNTER — Encounter (HOSPITAL_COMMUNITY): Payer: Self-pay | Admitting: Internal Medicine

## 2020-03-03 DIAGNOSIS — Z808 Family history of malignant neoplasm of other organs or systems: Secondary | ICD-10-CM

## 2020-03-03 DIAGNOSIS — I4892 Unspecified atrial flutter: Secondary | ICD-10-CM | POA: Diagnosis present

## 2020-03-03 DIAGNOSIS — D62 Acute posthemorrhagic anemia: Secondary | ICD-10-CM | POA: Diagnosis not present

## 2020-03-03 DIAGNOSIS — Z8042 Family history of malignant neoplasm of prostate: Secondary | ICD-10-CM

## 2020-03-03 DIAGNOSIS — Z96642 Presence of left artificial hip joint: Secondary | ICD-10-CM | POA: Diagnosis not present

## 2020-03-03 DIAGNOSIS — B9689 Other specified bacterial agents as the cause of diseases classified elsewhere: Secondary | ICD-10-CM | POA: Diagnosis present

## 2020-03-03 DIAGNOSIS — L7632 Postprocedural hematoma of skin and subcutaneous tissue following other procedure: Secondary | ICD-10-CM | POA: Diagnosis not present

## 2020-03-03 DIAGNOSIS — E11649 Type 2 diabetes mellitus with hypoglycemia without coma: Secondary | ICD-10-CM | POA: Diagnosis present

## 2020-03-03 DIAGNOSIS — Z8546 Personal history of malignant neoplasm of prostate: Secondary | ICD-10-CM

## 2020-03-03 DIAGNOSIS — E872 Acidosis: Secondary | ICD-10-CM | POA: Diagnosis not present

## 2020-03-03 DIAGNOSIS — I252 Old myocardial infarction: Secondary | ICD-10-CM

## 2020-03-03 DIAGNOSIS — I480 Paroxysmal atrial fibrillation: Secondary | ICD-10-CM | POA: Diagnosis present

## 2020-03-03 DIAGNOSIS — T8452XA Infection and inflammatory reaction due to internal left hip prosthesis, initial encounter: Principal | ICD-10-CM | POA: Diagnosis present

## 2020-03-03 DIAGNOSIS — E669 Obesity, unspecified: Secondary | ICD-10-CM | POA: Diagnosis present

## 2020-03-03 DIAGNOSIS — M25452 Effusion, left hip: Secondary | ICD-10-CM

## 2020-03-03 DIAGNOSIS — Y839 Surgical procedure, unspecified as the cause of abnormal reaction of the patient, or of later complication, without mention of misadventure at the time of the procedure: Secondary | ICD-10-CM | POA: Diagnosis not present

## 2020-03-03 DIAGNOSIS — Z87442 Personal history of urinary calculi: Secondary | ICD-10-CM

## 2020-03-03 DIAGNOSIS — M169 Osteoarthritis of hip, unspecified: Secondary | ICD-10-CM | POA: Diagnosis present

## 2020-03-03 DIAGNOSIS — Z4659 Encounter for fitting and adjustment of other gastrointestinal appliance and device: Secondary | ICD-10-CM

## 2020-03-03 DIAGNOSIS — I10 Essential (primary) hypertension: Secondary | ICD-10-CM | POA: Diagnosis present

## 2020-03-03 DIAGNOSIS — I129 Hypertensive chronic kidney disease with stage 1 through stage 4 chronic kidney disease, or unspecified chronic kidney disease: Secondary | ICD-10-CM | POA: Diagnosis present

## 2020-03-03 DIAGNOSIS — R140 Abdominal distension (gaseous): Secondary | ICD-10-CM | POA: Diagnosis not present

## 2020-03-03 DIAGNOSIS — E1169 Type 2 diabetes mellitus with other specified complication: Secondary | ICD-10-CM | POA: Diagnosis present

## 2020-03-03 DIAGNOSIS — E785 Hyperlipidemia, unspecified: Secondary | ICD-10-CM | POA: Diagnosis present

## 2020-03-03 DIAGNOSIS — Z6834 Body mass index (BMI) 34.0-34.9, adult: Secondary | ICD-10-CM

## 2020-03-03 DIAGNOSIS — K567 Ileus, unspecified: Secondary | ICD-10-CM | POA: Diagnosis not present

## 2020-03-03 DIAGNOSIS — N179 Acute kidney failure, unspecified: Secondary | ICD-10-CM | POA: Diagnosis not present

## 2020-03-03 DIAGNOSIS — L02416 Cutaneous abscess of left lower limb: Secondary | ICD-10-CM | POA: Diagnosis present

## 2020-03-03 DIAGNOSIS — E1165 Type 2 diabetes mellitus with hyperglycemia: Secondary | ICD-10-CM | POA: Diagnosis not present

## 2020-03-03 DIAGNOSIS — N1832 Chronic kidney disease, stage 3b: Secondary | ICD-10-CM | POA: Diagnosis present

## 2020-03-03 DIAGNOSIS — Z803 Family history of malignant neoplasm of breast: Secondary | ICD-10-CM

## 2020-03-03 DIAGNOSIS — N201 Calculus of ureter: Secondary | ICD-10-CM | POA: Diagnosis present

## 2020-03-03 DIAGNOSIS — Z87891 Personal history of nicotine dependence: Secondary | ICD-10-CM

## 2020-03-03 DIAGNOSIS — Z923 Personal history of irradiation: Secondary | ICD-10-CM

## 2020-03-03 DIAGNOSIS — Z91013 Allergy to seafood: Secondary | ICD-10-CM

## 2020-03-03 DIAGNOSIS — Z96643 Presence of artificial hip joint, bilateral: Secondary | ICD-10-CM | POA: Diagnosis present

## 2020-03-03 DIAGNOSIS — K219 Gastro-esophageal reflux disease without esophagitis: Secondary | ICD-10-CM | POA: Diagnosis present

## 2020-03-03 DIAGNOSIS — E1122 Type 2 diabetes mellitus with diabetic chronic kidney disease: Secondary | ICD-10-CM | POA: Diagnosis present

## 2020-03-03 DIAGNOSIS — K56609 Unspecified intestinal obstruction, unspecified as to partial versus complete obstruction: Secondary | ICD-10-CM

## 2020-03-03 DIAGNOSIS — Y831 Surgical operation with implant of artificial internal device as the cause of abnormal reaction of the patient, or of later complication, without mention of misadventure at the time of the procedure: Secondary | ICD-10-CM | POA: Diagnosis present

## 2020-03-03 DIAGNOSIS — Z01810 Encounter for preprocedural cardiovascular examination: Secondary | ICD-10-CM

## 2020-03-03 DIAGNOSIS — M25559 Pain in unspecified hip: Secondary | ICD-10-CM | POA: Diagnosis present

## 2020-03-03 DIAGNOSIS — Z96652 Presence of left artificial knee joint: Secondary | ICD-10-CM | POA: Diagnosis present

## 2020-03-03 DIAGNOSIS — Z7984 Long term (current) use of oral hypoglycemic drugs: Secondary | ICD-10-CM

## 2020-03-03 DIAGNOSIS — I2510 Atherosclerotic heart disease of native coronary artery without angina pectoris: Secondary | ICD-10-CM | POA: Diagnosis present

## 2020-03-03 DIAGNOSIS — R7881 Bacteremia: Secondary | ICD-10-CM

## 2020-03-03 DIAGNOSIS — E876 Hypokalemia: Secondary | ICD-10-CM | POA: Diagnosis not present

## 2020-03-03 DIAGNOSIS — Z20822 Contact with and (suspected) exposure to covid-19: Secondary | ICD-10-CM | POA: Diagnosis present

## 2020-03-03 DIAGNOSIS — M109 Gout, unspecified: Secondary | ICD-10-CM | POA: Diagnosis present

## 2020-03-03 DIAGNOSIS — Z79899 Other long term (current) drug therapy: Secondary | ICD-10-CM

## 2020-03-03 DIAGNOSIS — L02419 Cutaneous abscess of limb, unspecified: Secondary | ICD-10-CM

## 2020-03-03 DIAGNOSIS — Z820 Family history of epilepsy and other diseases of the nervous system: Secondary | ICD-10-CM

## 2020-03-03 DIAGNOSIS — Z7982 Long term (current) use of aspirin: Secondary | ICD-10-CM

## 2020-03-03 DIAGNOSIS — D631 Anemia in chronic kidney disease: Secondary | ICD-10-CM | POA: Diagnosis present

## 2020-03-03 LAB — GLUCOSE, CAPILLARY: Glucose-Capillary: 225 mg/dL — ABNORMAL HIGH (ref 70–99)

## 2020-03-03 LAB — MISC LABCORP TEST (SEND OUT): Labcorp test code: 71522

## 2020-03-03 MED ORDER — GABAPENTIN 300 MG PO CAPS
300.0000 mg | ORAL_CAPSULE | Freq: Three times a day (TID) | ORAL | Status: DC
  Administered 2020-03-03 – 2020-03-20 (×40): 300 mg via ORAL
  Filled 2020-03-03 (×40): qty 1

## 2020-03-03 MED ORDER — ATORVASTATIN CALCIUM 40 MG PO TABS
40.0000 mg | ORAL_TABLET | Freq: Every day | ORAL | Status: DC
  Administered 2020-03-07 – 2020-03-20 (×13): 40 mg via ORAL
  Filled 2020-03-03 (×14): qty 1

## 2020-03-03 MED ORDER — MORPHINE SULFATE 15 MG PO TABS
7.5000 mg | ORAL_TABLET | ORAL | Status: DC | PRN
  Administered 2020-03-03 – 2020-03-08 (×5): 7.5 mg via ORAL
  Filled 2020-03-03 (×5): qty 1

## 2020-03-03 MED ORDER — HEPARIN SODIUM (PORCINE) 5000 UNIT/ML IJ SOLN
5000.0000 [IU] | Freq: Three times a day (TID) | INTRAMUSCULAR | Status: DC
  Administered 2020-03-03 – 2020-03-04 (×2): 5000 [IU] via SUBCUTANEOUS
  Filled 2020-03-03 (×2): qty 1

## 2020-03-03 MED ORDER — INSULIN ASPART 100 UNIT/ML ~~LOC~~ SOLN
0.0000 [IU] | Freq: Every day | SUBCUTANEOUS | Status: DC
  Administered 2020-03-03: 2 [IU] via SUBCUTANEOUS
  Administered 2020-03-18: 3 [IU] via SUBCUTANEOUS

## 2020-03-03 MED ORDER — INSULIN ASPART 100 UNIT/ML ~~LOC~~ SOLN
0.0000 [IU] | Freq: Three times a day (TID) | SUBCUTANEOUS | Status: DC
  Administered 2020-03-04: 3 [IU] via SUBCUTANEOUS
  Administered 2020-03-04: 5 [IU] via SUBCUTANEOUS
  Administered 2020-03-04 – 2020-03-05 (×4): 3 [IU] via SUBCUTANEOUS
  Administered 2020-03-06: 2 [IU] via SUBCUTANEOUS
  Administered 2020-03-06 (×2): 3 [IU] via SUBCUTANEOUS
  Administered 2020-03-07: 2 [IU] via SUBCUTANEOUS
  Administered 2020-03-07: 5 [IU] via SUBCUTANEOUS
  Administered 2020-03-07 – 2020-03-08 (×2): 3 [IU] via SUBCUTANEOUS
  Administered 2020-03-08: 2 [IU] via SUBCUTANEOUS
  Administered 2020-03-08: 3 [IU] via SUBCUTANEOUS
  Administered 2020-03-10: 5 [IU] via SUBCUTANEOUS
  Administered 2020-03-10 – 2020-03-11 (×5): 3 [IU] via SUBCUTANEOUS
  Administered 2020-03-12: 2 [IU] via SUBCUTANEOUS
  Administered 2020-03-12: 3 [IU] via SUBCUTANEOUS
  Administered 2020-03-12 – 2020-03-13 (×2): 2 [IU] via SUBCUTANEOUS
  Administered 2020-03-13 – 2020-03-14 (×2): 3 [IU] via SUBCUTANEOUS
  Administered 2020-03-14 – 2020-03-15 (×2): 2 [IU] via SUBCUTANEOUS
  Administered 2020-03-15 – 2020-03-16 (×2): 3 [IU] via SUBCUTANEOUS
  Administered 2020-03-18: 5 [IU] via SUBCUTANEOUS
  Administered 2020-03-18 (×2): 3 [IU] via SUBCUTANEOUS
  Administered 2020-03-19 (×2): 2 [IU] via SUBCUTANEOUS
  Administered 2020-03-19 – 2020-03-20 (×3): 3 [IU] via SUBCUTANEOUS
  Administered 2020-03-20: 2 [IU] via SUBCUTANEOUS

## 2020-03-03 MED ORDER — CARVEDILOL 25 MG PO TABS
25.0000 mg | ORAL_TABLET | Freq: Two times a day (BID) | ORAL | Status: DC
  Administered 2020-03-04: 25 mg via ORAL
  Filled 2020-03-03: qty 1

## 2020-03-03 MED ORDER — DOCUSATE SODIUM 100 MG PO CAPS
100.0000 mg | ORAL_CAPSULE | Freq: Every day | ORAL | Status: DC | PRN
  Administered 2020-03-03: 100 mg via ORAL
  Filled 2020-03-03: qty 1

## 2020-03-03 MED ORDER — ALBUTEROL SULFATE HFA 108 (90 BASE) MCG/ACT IN AERS: 2.0000 | INHALATION_SPRAY | Freq: Four times a day (QID) | RESPIRATORY_TRACT | Status: DC | PRN

## 2020-03-03 MED ORDER — DICLOFENAC SODIUM 1 % EX GEL
4.0000 g | Freq: Four times a day (QID) | CUTANEOUS | Status: DC
  Administered 2020-03-03 – 2020-03-09 (×22): 4 g via TOPICAL
  Filled 2020-03-03: qty 100

## 2020-03-03 MED ORDER — AMLODIPINE BESYLATE 10 MG PO TABS
10.0000 mg | ORAL_TABLET | Freq: Every day | ORAL | Status: DC
  Filled 2020-03-03: qty 1

## 2020-03-03 MED ORDER — FERROUS SULFATE 325 (65 FE) MG PO TABS
325.0000 mg | ORAL_TABLET | Freq: Every day | ORAL | Status: DC
  Administered 2020-03-08 – 2020-03-15 (×7): 325 mg via ORAL
  Filled 2020-03-03 (×8): qty 1

## 2020-03-03 MED ORDER — PANTOPRAZOLE SODIUM 40 MG PO TBEC
40.0000 mg | DELAYED_RELEASE_TABLET | Freq: Every day | ORAL | Status: DC
  Administered 2020-03-03 – 2020-03-20 (×14): 40 mg via ORAL
  Filled 2020-03-03 (×15): qty 1

## 2020-03-03 NOTE — Patient Outreach (Signed)
  [facility]) Care Management Chronic Special Needs Program    03/03/2020  Name: [name], DOB: 1945-07-05  MRN: [id number]   [name] is enrolled in a chronic special needs plan. RNCM received notification that client was admitted to the hospital 03/03/20. Per chart, Client with new diagnosis of atrial flutter on 03/01/20 at Emergency room visit. Care Plan sent to [facility] Utilization management team.  Plan: care coordinate with [facility] Liaison and inpatient case management as indicated. Continue to follow.  Thea Silversmith, RN, MSN, [place] [phone]

## 2020-03-03 NOTE — Plan of Care (Signed)
  Problem: Education: Goal: Knowledge of General Education information will improve Description: Including pain rating scale, medication(s)/side effects and non-pharmacologic comfort measures Outcome: Progressing   Problem: Clinical Measurements: Goal: Will remain free from infection Outcome: Progressing Goal: Diagnostic test results will improve Outcome: Progressing   Problem: Activity: Goal: Risk for activity intolerance will decrease Outcome: Progressing   Problem: Nutrition: Goal: Adequate nutrition will be maintained Outcome: Progressing   Problem: Pain Managment: Goal: General experience of comfort will improve Outcome: Progressing   Problem: Safety: Goal: Ability to remain free from injury will improve Outcome: Progressing   Problem: Skin Integrity: Goal: Risk for impaired skin integrity will decrease Outcome: Progressing

## 2020-03-03 NOTE — Plan of Care (Signed)
Initiated

## 2020-03-03 NOTE — H&P (Signed)
History and Physical    [name] EUM:[id number] DOB: 1945/06/18 DOA: 03/03/2020  PCP: Luetta Nutting, DO   Patient coming from: Home    Chief Complaint: Pain in the left hip  HPI: [name] is a 74 y.o. male with medical history significant of coronary artery disease, diabetes type 2, hypertension, anemia, CKD stage IIIb, osteoarthritis status post left hip arthroplasty, renal stone/hydronephrosis status post stents, prostate cancer ,paroxysmal atrial flutter not on anticoagulation who was sent for direct admission to [facility],Dr Aluisio.  Patient was seen in the emergency department on 03/01/2020 for left hip pain.  There was no report of injury.  He had CT left hip done which showed  fluid collection.  He was found to have elevated ESR and CRP.  He was discharged from the emergency department to follow-up with orthopedics as per orthopedics recommendation.   In the orthopedics office, he reported chills, nausea/diarrhea and increased left hip pain.  He was hardly able to walk.  He was afebrile.  Patient was requested for direct admission by orthopedics team for drainage or intervention of the left hip fluid collection.  Lab work also showed elevated ESR and CRP. Patient seen and examined at the bedside this evening.  During my evaluation, he was hemodynamically stable and overall comfortable.  He was hypertensive.  He complained of severe pain on his left hip and the left ear was found to be tender.  He denied any fever, chest pain, shortness of breath, cough, abdomen pain, nausea, vomiting, dysuria, hematochezia or melena.  He ives alone.   Review of Systems: As per HPI otherwise 10 point review of systems negative.    Past Medical History:  Diagnosis Date  . Arthritis   . Asthma    no inhaler  . Coronary artery disease    cardiologist-  dr spruill; last visit 3 mos ago per pt  . GERD (gastroesophageal reflux disease)   .  History of MI (myocardial infarction)    1985  . Hydronephrosis, left   . Hypertension   . Myocardial infarction ([place])   . Nephrolithiasis    left  . 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 Presence of tooth-root and mandibular implants    lower dental implants  . Prostate cancer ([place])   . Shortness of breath    WITH EXERTION  . Type 2 diabetes mellitus ([place])     Past Surgical History:  Procedure Laterality Date  . BUNIONECTOMY    . CYSTOSCOPY W/ RETROGRADES Left 03/14/2014   Procedure: CYSTOSCOPY WITH LEFT RETROGRADE PYELOGRAM, Left Ureteroscopy, [place] ureteral Stent No string;  Surgeon: [name], MD;  Location: [facility];  Service: Urology;  Laterality: Left;  . CYSTOSCOPY W/ URETERAL STENT PLACEMENT Bilateral 01/15/2020   Procedure: CYSTOSCOPY WITH RETROGRADE PYELOGRAM/URETERAL STENT PLACEMENT;  Surgeon: Alexis Frock, MD;  Location: [facility];  Service: Urology;  Laterality: Bilateral;  . HERNIA REPAIR    . PROSTATE BIOPSY    . TOTAL HIP ARTHROPLASTY Left 03-20-2009  . TOTAL HIP ARTHROPLASTY  04/09/2012   Procedure: TOTAL HIP ARTHROPLASTY;  Surgeon: [name], MD;  Location: [facility];  Service: Orthopedics;  Laterality: Right;  . TOTAL KNEE ARTHROPLASTY Left 05-16-2008     reports that he quit smoking about 35 years ago. His smoking use included cigarettes. He has a 25.00 pack-year smoking history. He quit smokeless tobacco use about 34 years ago. He reports  current alcohol use. He reports that he does not use drugs.  Allergies  Allergen Reactions  . Shellfish Allergy Rash    Family History  Problem Relation Age of Onset  . Cancer Mother        breast  . Multiple sclerosis Daughter   . Cancer Father        prostate  . Cancer Maternal Uncle        bone     Prior to Admission medications   Medication Sig Start Date End Date Taking? Authorizing Provider  albuterol (VENTOLIN HFA) 108 (90 Base) MCG/ACT inhaler Inhale 2 puffs  into the lungs every 6 (six) hours as needed for wheezing or shortness of breath. 08/09/19 03/01/20  Luetta Nutting, DO  amLODipine (NORVASC) 10 MG tablet Take 10 mg by mouth daily.    [provider]  Aspirin 81 MG CAPS Take 81 mg by mouth daily.     [provider]  atorvastatin (LIPITOR) 40 MG tablet Take 1 tablet (40 mg total) by mouth daily. 06/26/16   Orson Eva, MD  carvedilol (COREG) 25 MG tablet Take 25 mg by mouth 2 (two) times daily with a meal.    [provider]  co-enzyme Q-10 30 MG capsule Take 30 mg by mouth daily.    [provider]  Continuous Blood Gluc Receiver (FREESTYLE LIBRE 14 DAY READER) [place] See admin instructions. 05/23/19   [provider]  Continuous Blood Gluc Sensor (FREESTYLE LIBRE 14 DAY SENSOR) MISC 1 Device by Does not apply route every 14 (fourteen) days. 05/23/19   Luetta Nutting, DO  cycloSPORINE (RESTASIS) 0.05 % ophthalmic emulsion Place 1 drop into both eyes 2 (two) times daily.  09/23/19   [provider]  diclofenac Sodium (VOLTAREN) 1 % GEL Apply 4 g topically 4 (four) times daily. 03/01/20   Deno Etienne, DO  docusate sodium (COLACE) 100 MG capsule Take 100 mg by mouth daily as needed for mild constipation.    [provider]  ferrous sulfate 325 (65 FE) MG tablet Take 325 mg by mouth daily with breakfast.    [provider]  gabapentin (NEURONTIN) 300 MG capsule Take 1 capsule (300 mg total) by mouth 3 (three) times daily. Start 341m at bedtime x1 week then may increase to three times per day. Patient taking differently: Take 300 mg by mouth See admin instructions. 3034mat bedtime  30055mid prn neuropathy 10/21/19   MatLuetta NuttingO  glimepiride (AMARYL) 4 MG tablet Take 4 mg by mouth daily before breakfast.    [provider]  morphine (MSIR) 15 MG tablet Take 0.5 tablets (7.5 mg total) by mouth every 4 (four) hours as needed for severe pain. 03/01/20   FloDeno EtienneO   omeprazole (PRILOSEC) 40 MG capsule Take 1 capsule (40 mg total) by mouth 2 (two) times daily. X 1 month, then once daily Patient taking differently: Take 40 mg by mouth daily.  06/26/16   TatOrson EvaD  predniSONE (DELTASONE) 50 MG tablet Take daily until resolution of gout symptoms. 02/21/20   MatLuetta NuttingO  sitaGLIPtin (JANUVIA) 50 MG tablet Take 1 tablet (50 mg total) by mouth daily. 06/26/16   TatOrson EvaD    Physical Exam: Vitals:   03/03/20 1740  BP: (!) 162/136  Pulse: 79  Resp: (!) 23  Temp: 97.7 F (36.5 C)  TempSrc: Oral  SpO2: 96%    Constitutional: Not in distress, deconditioned/debilitated male Vitals:   03/03/20 1740  BP: (!) 162/136  Pulse: 79  Resp: (!) 23  Temp: 97.7 F (36.5 C)  TempSrc: Oral  SpO2: 96%   Eyes: PERRL, lids and conjunctivae normal ENMT: Mucous membranes are moist.  Neck: normal, supple, no masses, no thyromegaly Respiratory: clear to auscultation bilaterally, no wheezing, no crackles. Normal respiratory effort. No accessory muscle use.  Cardiovascular: Irregularly irregular rhythm, no murmurs / rubs / gallops. No extremity edema.  Abdomen: no tenderness, no masses palpated. No hepatosplenomegaly. Bowel sounds positive.  Distended abdomen Musculoskeletal: no clubbing / cyanosis. No joint deformity upper and lower extremities.  Severe tenderness on the left hip, no ulcers or drainage Skin: no rashes, lesions, ulcers. No induration Neurologic: CN 2-12 grossly intact.  Strength 5/5 in all 4.  Psychiatric: Normal judgment and insight. Alert and oriented x 3. Normal mood.   Foley Catheter:None  Labs on Admission: I have personally reviewed following labs and imaging studies  CBC: Recent Labs  Lab 03/01/20 0925  WBC 10.1  NEUTROABS 8.5*  HGB 8.6*  HCT 26.3*  MCV 92.9  PLT 716*   Basic Metabolic Panel: Recent Labs  Lab 03/01/20 0925  NA 133*  K 4.1  CL 106  CO2 18*  GLUCOSE 209*  BUN 59*  CREATININE 2.17*  CALCIUM  7.9*   GFR: Estimated Creatinine Clearance: 39.5 mL/min (A) (by C-G formula based on SCr of 2.17 mg/dL (H)). Liver Function Tests: No results for input(s): AST, ALT, ALKPHOS, BILITOT, PROT, ALBUMIN in the last 168 hours. No results for input(s): LIPASE, AMYLASE in the last 168 hours. No results for input(s): AMMONIA in the last 168 hours. Coagulation Profile: No results for input(s): INR, PROTIME in the last 168 hours. Cardiac Enzymes: No results for input(s): CKTOTAL, CKMB, CKMBINDEX, TROPONINI in the last 168 hours. BNP (last 3 results) No results for input(s): PROBNP in the last 8760 hours. HbA1C: No results for input(s): HGBA1C in the last 72 hours. CBG: No results for input(s): GLUCAP in the last 168 hours. Lipid Profile: No results for input(s): CHOL, HDL, LDLCALC, TRIG, CHOLHDL, LDLDIRECT in the last 72 hours. Thyroid Function Tests: No results for input(s): TSH, T4TOTAL, FREET4, T3FREE, THYROIDAB in the last 72 hours. Anemia Panel: No results for input(s): VITAMINB12, FOLATE, FERRITIN, TIBC, IRON, RETICCTPCT in the last 72 hours. Urine analysis:    Component Value Date/Time   COLORURINE STRAW (A) 01/14/2020 1544   APPEARANCEUR CLEAR 01/14/2020 1544   LABSPEC 1.013 01/14/2020 1544   PHURINE 5.0 01/14/2020 1544   GLUCOSEU NEGATIVE 01/14/2020 1544   HGBUR NEGATIVE 01/14/2020 1544   BILIRUBINUR negative 02/21/2020 1132   KETONESUR negative 02/21/2020 [place] 01/14/2020 1544   PROTEINUR NEGATIVE 01/14/2020 1544   UROBILINOGEN 0.2 02/21/2020 1132   UROBILINOGEN 0.2 10/24/2014 1035   NITRITE Negative 02/21/2020 1132   NITRITE NEGATIVE 01/14/2020 1544   LEUKOCYTESUR Moderate (2+) (A) 02/21/2020 1132   LEUKOCYTESUR NEGATIVE 01/14/2020 1544    Radiological Exams on Admission: No results found.   Assessment/Plan Active Problems:   OA (osteoarthritis) of hip   Type 2 diabetes mellitus with hypoglycemia without coma (HCC)   Essential hypertension    Hyperlipidemia with target LDL less than 70   Hip pain   Left hip collection/ pain: CT left hip showed  large fluid and air containing collection surrounding the proximal femoral component measuring up to 8.7 cm, concerning for infection/abscess. Fluid and air collection track within the left adductor muscle compartment extending towards the symphysis. Periprosthetic lucency along the  posterior cortex of the proximal femoral stem extending distally by approximately 7.5 cm, concerning for septic loosening,additional area of lucency along the anterior aspect of the proximal native femur with air seen within bone, concerning for osteomyelitis. Orthopedics aware about his patient's presence.  Planning for left CT hip aspiration tomorrow. We will get blood cultures, cultures from the left hip aspiration.  We will start antibiotics only from tomorrow .  Patient is currently not septic.Discussed the case with Dr Wynelle Link.  Paroxysmal A. fib/atrial flutter: Follows with Dr [name].  Not on any anticoagulation at home because of anemia.  Was on anticoagulation in the past..  On Coreg for rate control.  Currently rate is controlled.  Diabetes type 2: Monitor blood sugars.  Continue sliding scale insulin while during hospitalization.  He takes Januvia, glimepiride at home.  Hemoglobin A1c of 7.1 as per 9/21.  CKD stage IIIb: His kidney function has fluctuated over several months from 1.5-3.  Continue to monitor kidney function.  Avoid nephrotoxins.  He needs to follow-up with nephrology as an outpatient.  History of severe left hydroureteronephrosis/prostate cancer: Follows with urology.  He is status post bilateral ureteral stent placement by Dr. [name] 10/6.  Currently prostate cancer is in remission.  Normocytic anemia: Most likely associated  with chronic kidney disease, chronic medical problems.  Hemoglobin currently stable in the range of 8.  No report of hematochezia or melena.  Coronary artery  disease: Takes aspirin.  Currently held for tomorrow's procedure. Denies any chest pain.    Hypertension: Hypertensive during my evaluation.  Will restart his home medications amlodipine, carvedilol .monitor blood pressure  Hyperlipidemia: Takes Lipitor.  GERD: Continue Protonix  Left hip pain/debility: We will request for PT/OT evaluation after procedure.   Severity of Illness: The appropriate patient status for this patient is obs  DVT prophylaxis: Heparin subcu Code Status: Full code Family Communication: None at the bedside Consults called: Orthopedics.     Shelly Coss MD [facility]  03/03/2020, 6:02 PM

## 2020-03-03 NOTE — Progress Notes (Signed)
Pt direct admit to new unit, Dr. Tawanna Solo paged. States will be up to see pt shortly. Pt situated and placed on tele and stable at this time. Will continue to monitor.

## 2020-03-04 ENCOUNTER — Observation Stay (HOSPITAL_COMMUNITY): Payer: HMO

## 2020-03-04 ENCOUNTER — Inpatient Hospital Stay (HOSPITAL_COMMUNITY): Payer: HMO

## 2020-03-04 ENCOUNTER — Encounter (HOSPITAL_COMMUNITY): Payer: Self-pay | Admitting: Internal Medicine

## 2020-03-04 DIAGNOSIS — T8452XA Infection and inflammatory reaction due to internal left hip prosthesis, initial encounter: Secondary | ICD-10-CM | POA: Diagnosis present

## 2020-03-04 DIAGNOSIS — K219 Gastro-esophageal reflux disease without esophagitis: Secondary | ICD-10-CM | POA: Diagnosis present

## 2020-03-04 DIAGNOSIS — R609 Edema, unspecified: Secondary | ICD-10-CM | POA: Diagnosis not present

## 2020-03-04 DIAGNOSIS — I4891 Unspecified atrial fibrillation: Secondary | ICD-10-CM | POA: Diagnosis not present

## 2020-03-04 DIAGNOSIS — Y831 Surgical operation with implant of artificial internal device as the cause of abnormal reaction of the patient, or of later complication, without mention of misadventure at the time of the procedure: Secondary | ICD-10-CM | POA: Diagnosis present

## 2020-03-04 DIAGNOSIS — E11649 Type 2 diabetes mellitus with hypoglycemia without coma: Secondary | ICD-10-CM | POA: Diagnosis not present

## 2020-03-04 DIAGNOSIS — Y839 Surgical procedure, unspecified as the cause of abnormal reaction of the patient, or of later complication, without mention of misadventure at the time of the procedure: Secondary | ICD-10-CM | POA: Diagnosis not present

## 2020-03-04 DIAGNOSIS — L7632 Postprocedural hematoma of skin and subcutaneous tissue following other procedure: Secondary | ICD-10-CM | POA: Diagnosis not present

## 2020-03-04 DIAGNOSIS — Z20822 Contact with and (suspected) exposure to covid-19: Secondary | ICD-10-CM | POA: Diagnosis present

## 2020-03-04 DIAGNOSIS — Z96642 Presence of left artificial hip joint: Secondary | ICD-10-CM | POA: Diagnosis not present

## 2020-03-04 DIAGNOSIS — E872 Acidosis: Secondary | ICD-10-CM | POA: Diagnosis not present

## 2020-03-04 DIAGNOSIS — N201 Calculus of ureter: Secondary | ICD-10-CM | POA: Diagnosis present

## 2020-03-04 DIAGNOSIS — B9689 Other specified bacterial agents as the cause of diseases classified elsewhere: Secondary | ICD-10-CM | POA: Diagnosis not present

## 2020-03-04 DIAGNOSIS — Z820 Family history of epilepsy and other diseases of the nervous system: Secondary | ICD-10-CM | POA: Diagnosis not present

## 2020-03-04 DIAGNOSIS — K6389 Other specified diseases of intestine: Secondary | ICD-10-CM | POA: Diagnosis not present

## 2020-03-04 DIAGNOSIS — Z803 Family history of malignant neoplasm of breast: Secondary | ICD-10-CM | POA: Diagnosis not present

## 2020-03-04 DIAGNOSIS — M00852 Arthritis due to other bacteria, left hip: Secondary | ICD-10-CM | POA: Diagnosis not present

## 2020-03-04 DIAGNOSIS — M25552 Pain in left hip: Secondary | ICD-10-CM | POA: Diagnosis present

## 2020-03-04 DIAGNOSIS — M25452 Effusion, left hip: Secondary | ICD-10-CM | POA: Diagnosis not present

## 2020-03-04 DIAGNOSIS — K56609 Unspecified intestinal obstruction, unspecified as to partial versus complete obstruction: Secondary | ICD-10-CM | POA: Diagnosis not present

## 2020-03-04 DIAGNOSIS — I129 Hypertensive chronic kidney disease with stage 1 through stage 4 chronic kidney disease, or unspecified chronic kidney disease: Secondary | ICD-10-CM | POA: Diagnosis present

## 2020-03-04 DIAGNOSIS — Z87442 Personal history of urinary calculi: Secondary | ICD-10-CM | POA: Diagnosis not present

## 2020-03-04 DIAGNOSIS — Z96652 Presence of left artificial knee joint: Secondary | ICD-10-CM | POA: Diagnosis present

## 2020-03-04 DIAGNOSIS — N179 Acute kidney failure, unspecified: Secondary | ICD-10-CM | POA: Diagnosis not present

## 2020-03-04 DIAGNOSIS — E785 Hyperlipidemia, unspecified: Secondary | ICD-10-CM | POA: Diagnosis present

## 2020-03-04 DIAGNOSIS — L02416 Cutaneous abscess of left lower limb: Secondary | ICD-10-CM | POA: Diagnosis present

## 2020-03-04 DIAGNOSIS — R140 Abdominal distension (gaseous): Secondary | ICD-10-CM | POA: Diagnosis not present

## 2020-03-04 DIAGNOSIS — N3000 Acute cystitis without hematuria: Secondary | ICD-10-CM | POA: Diagnosis not present

## 2020-03-04 DIAGNOSIS — I252 Old myocardial infarction: Secondary | ICD-10-CM | POA: Diagnosis not present

## 2020-03-04 DIAGNOSIS — T8459XD Infection and inflammatory reaction due to other internal joint prosthesis, subsequent encounter: Secondary | ICD-10-CM | POA: Diagnosis not present

## 2020-03-04 DIAGNOSIS — M109 Gout, unspecified: Secondary | ICD-10-CM | POA: Diagnosis present

## 2020-03-04 DIAGNOSIS — I10 Essential (primary) hypertension: Secondary | ICD-10-CM | POA: Diagnosis not present

## 2020-03-04 DIAGNOSIS — K3189 Other diseases of stomach and duodenum: Secondary | ICD-10-CM | POA: Diagnosis not present

## 2020-03-04 DIAGNOSIS — M7989 Other specified soft tissue disorders: Secondary | ICD-10-CM | POA: Diagnosis not present

## 2020-03-04 DIAGNOSIS — B952 Enterococcus as the cause of diseases classified elsewhere: Secondary | ICD-10-CM | POA: Diagnosis not present

## 2020-03-04 DIAGNOSIS — N183 Chronic kidney disease, stage 3 unspecified: Secondary | ICD-10-CM | POA: Diagnosis not present

## 2020-03-04 DIAGNOSIS — E1122 Type 2 diabetes mellitus with diabetic chronic kidney disease: Secondary | ICD-10-CM | POA: Diagnosis present

## 2020-03-04 DIAGNOSIS — D62 Acute posthemorrhagic anemia: Secondary | ICD-10-CM | POA: Diagnosis not present

## 2020-03-04 DIAGNOSIS — I4892 Unspecified atrial flutter: Secondary | ICD-10-CM | POA: Diagnosis present

## 2020-03-04 DIAGNOSIS — T8451XA Infection and inflammatory reaction due to internal right hip prosthesis, initial encounter: Secondary | ICD-10-CM | POA: Diagnosis not present

## 2020-03-04 DIAGNOSIS — Z96643 Presence of artificial hip joint, bilateral: Secondary | ICD-10-CM | POA: Diagnosis present

## 2020-03-04 DIAGNOSIS — Z8546 Personal history of malignant neoplasm of prostate: Secondary | ICD-10-CM | POA: Diagnosis not present

## 2020-03-04 DIAGNOSIS — Z8042 Family history of malignant neoplasm of prostate: Secondary | ICD-10-CM | POA: Diagnosis not present

## 2020-03-04 DIAGNOSIS — R7881 Bacteremia: Secondary | ICD-10-CM | POA: Diagnosis not present

## 2020-03-04 DIAGNOSIS — I2510 Atherosclerotic heart disease of native coronary artery without angina pectoris: Secondary | ICD-10-CM | POA: Diagnosis present

## 2020-03-04 DIAGNOSIS — K567 Ileus, unspecified: Secondary | ICD-10-CM | POA: Diagnosis not present

## 2020-03-04 LAB — CBC
HCT: 30.9 % — ABNORMAL LOW (ref 39.0–52.0)
Hemoglobin: 9.8 g/dL — ABNORMAL LOW (ref 13.0–17.0)
MCH: 29.4 pg (ref 26.0–34.0)
MCHC: 31.7 g/dL (ref 30.0–36.0)
MCV: 92.8 fL (ref 80.0–100.0)
Platelets: 596 10*3/uL — ABNORMAL HIGH (ref 150–400)
RBC: 3.33 MIL/uL — ABNORMAL LOW (ref 4.22–5.81)
RDW: 14.5 % (ref 11.5–15.5)
WBC: 8.2 10*3/uL (ref 4.0–10.5)
nRBC: 0 % (ref 0.0–0.2)

## 2020-03-04 LAB — BASIC METABOLIC PANEL
Anion gap: 14 (ref 5–15)
BUN: 91 mg/dL — ABNORMAL HIGH (ref 8–23)
CO2: 18 mmol/L — ABNORMAL LOW (ref 22–32)
Calcium: 8.6 mg/dL — ABNORMAL LOW (ref 8.9–10.3)
Chloride: 103 mmol/L (ref 98–111)
Creatinine, Ser: 2.2 mg/dL — ABNORMAL HIGH (ref 0.61–1.24)
GFR, Estimated: 31 mL/min — ABNORMAL LOW (ref >60)
Glucose, Bld: 198 mg/dL — ABNORMAL HIGH (ref 70–99)
Potassium: 4.7 mmol/L (ref 3.5–5.1)
Sodium: 135 mmol/L (ref 135–145)

## 2020-03-04 LAB — GLUCOSE, CAPILLARY
Glucose-Capillary: 176 mg/dL — ABNORMAL HIGH (ref 70–99)
Glucose-Capillary: 213 mg/dL — ABNORMAL HIGH (ref 70–99)
Glucose-Capillary: 172 mg/dL — ABNORMAL HIGH (ref 70–99)
Glucose-Capillary: 144 mg/dL — ABNORMAL HIGH (ref 70–99)

## 2020-03-04 LAB — PROTIME-INR
INR: 1.1 (ref 0.8–1.2)
Prothrombin Time: 13.9 seconds (ref 11.4–15.2)

## 2020-03-04 MED ORDER — FENTANYL CITRATE (PF) 100 MCG/2ML IJ SOLN
INTRAMUSCULAR | Status: AC | PRN
  Administered 2020-03-04 (×2): 50 ug via INTRAVENOUS

## 2020-03-04 MED ORDER — FENTANYL CITRATE (PF) 100 MCG/2ML IJ SOLN
INTRAMUSCULAR | Status: AC
  Filled 2020-03-04: qty 2

## 2020-03-04 MED ORDER — ONDANSETRON HCL 4 MG/2ML IJ SOLN: 4.0000 mg | Freq: Four times a day (QID) | INTRAMUSCULAR | Status: DC | PRN

## 2020-03-04 MED ORDER — LABETALOL HCL 5 MG/ML IV SOLN: 10.0000 mg | INTRAVENOUS | Status: DC | PRN

## 2020-03-04 MED ORDER — LIDOCAINE-EPINEPHRINE 1 %-1:100000 IJ SOLN
INTRAMUSCULAR | Status: AC | PRN
  Administered 2020-03-04: 10 mL

## 2020-03-04 MED ORDER — LIDOCAINE-EPINEPHRINE (PF) 2 %-1:200000 IJ SOLN
INTRAMUSCULAR | Status: AC
  Filled 2020-03-04: qty 20

## 2020-03-04 MED ORDER — LACTATED RINGERS IV SOLN: INTRAVENOUS | Status: AC

## 2020-03-04 MED ORDER — MIDAZOLAM HCL 2 MG/2ML IJ SOLN
INTRAMUSCULAR | Status: AC
  Filled 2020-03-04: qty 4

## 2020-03-04 MED ORDER — PROMETHAZINE HCL 25 MG/ML IJ SOLN
12.5000 mg | Freq: Once | INTRAMUSCULAR | Status: AC
  Administered 2020-03-04: 12.5 mg via INTRAVENOUS
  Filled 2020-03-04: qty 1

## 2020-03-04 MED ORDER — HEPARIN SODIUM (PORCINE) 5000 UNIT/ML IJ SOLN
5000.0000 [IU] | Freq: Three times a day (TID) | INTRAMUSCULAR | Status: DC
  Administered 2020-03-04 – 2020-03-05 (×3): 5000 [IU] via SUBCUTANEOUS
  Filled 2020-03-04 (×3): qty 1

## 2020-03-04 MED ORDER — MIDAZOLAM HCL 2 MG/2ML IJ SOLN
INTRAMUSCULAR | Status: AC | PRN
  Administered 2020-03-04 (×2): 1 mg via INTRAVENOUS

## 2020-03-04 MED ORDER — METOPROLOL TARTRATE 5 MG/5ML IV SOLN
5.0000 mg | Freq: Four times a day (QID) | INTRAVENOUS | Status: DC
  Administered 2020-03-05 (×4): 5 mg via INTRAVENOUS
  Filled 2020-03-04 (×4): qty 5

## 2020-03-04 MED ORDER — DIATRIZOATE MEGLUMINE & SODIUM 66-10 % PO SOLN
90.0000 mL | Freq: Once | ORAL | Status: AC
  Administered 2020-03-05: 90 mL via NASOGASTRIC
  Filled 2020-03-04: qty 90

## 2020-03-04 NOTE — Consult Note (Signed)
[name] 1945-11-26  [id number].    Requesting MD: Dr. Florene Glen Chief Complaint/Reason for Consult: bowel obstruction with umbilical hernia  HPI:  [name] is a 74 yo male with a history of CKD, CAD, DM, and prior left hip arthroplasty who was admitted for a left hip abscess. He underwent percutaneous drain placement by IR early today. During his hospitalization he has had worsening abdominal distension, nausea and vomiting, for which an NG tube was placed last night. He has continued to have distension today. He endorses flatus but has not had a bowel movement in a week. He denies abdominal pain at the time of my exam. A CT scan was done and showed dilated proximal small bowel as well as a small umbilical hernia. General surgery was consulted.  Patient reports a history of right inguinal hernia repair but has not had any other abdominal surgeries.  ROS: Review of Systems  Constitutional: Negative for chills and fever.  HENT: Negative for hearing loss.   Respiratory: Negative for cough.   Cardiovascular: Negative for chest pain.  Gastrointestinal: Positive for abdominal pain, constipation and nausea.  Musculoskeletal:       Left hip abscess  Neurological: Negative for seizures and weakness.    Family History  Problem Relation Age of Onset  . Cancer Mother        breast  . Multiple sclerosis Daughter   . Cancer Father        prostate  . Cancer Maternal Uncle        bone    Past Medical History:  Diagnosis Date  . Arthritis   . Asthma    no inhaler  . Coronary artery disease    cardiologist-  dr spruill; last visit 3 mos ago per pt  . GERD (gastroesophageal reflux disease)   . History of MI (myocardial infarction)    1985  . Hydronephrosis, left   . Hypertension   . Myocardial infarction ([place])   . Nephrolithiasis    left  . 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 Presence of tooth-root and mandibular implants    lower dental implants    . Prostate cancer ([place])   . Shortness of breath    WITH EXERTION  . Type 2 diabetes mellitus ([place])     Past Surgical History:  Procedure Laterality Date  . BUNIONECTOMY    . CYSTOSCOPY W/ RETROGRADES Left 03/14/2014   Procedure: CYSTOSCOPY WITH LEFT RETROGRADE PYELOGRAM, Left Ureteroscopy, [place] ureteral Stent No string;  Surgeon: [name], MD;  Location: [facility];  Service: Urology;  Laterality: Left;  . CYSTOSCOPY W/ URETERAL STENT PLACEMENT Bilateral 01/15/2020   Procedure: CYSTOSCOPY WITH RETROGRADE PYELOGRAM/URETERAL STENT PLACEMENT;  Surgeon: Alexis Frock, MD;  Location: [facility];  Service: Urology;  Laterality: Bilateral;  . HERNIA REPAIR    . PROSTATE BIOPSY    . TOTAL HIP ARTHROPLASTY Left 03-20-2009  . TOTAL HIP ARTHROPLASTY  04/09/2012   Procedure: TOTAL HIP ARTHROPLASTY;  Surgeon: [name], MD;  Location: [facility];  Service: Orthopedics;  Laterality: Right;  . TOTAL KNEE ARTHROPLASTY Left 05-16-2008    Social History:  reports that he quit smoking about 35 years ago. His smoking use included cigarettes. He has a 25.00 pack-year smoking history. He quit smokeless tobacco use about 34 years ago. He reports current alcohol use. He reports that he does not use drugs.  Allergies:  Allergies  Allergen Reactions  .  Shellfish Allergy Rash    Medications Prior to Admission  Medication Sig Dispense Refill  . albuterol (VENTOLIN HFA) 108 (90 Base) MCG/ACT inhaler Inhale 2 puffs into the lungs every 6 (six) hours as needed for wheezing or shortness of breath. 6.7 g 6  . amLODipine (NORVASC) 10 MG tablet Take 10 mg by mouth daily.    . Aspirin 81 MG CAPS Take 81 mg by mouth daily.     Marland Kitchen atorvastatin (LIPITOR) 40 MG tablet Take 1 tablet (40 mg total) by mouth daily. 30 tablet 0  . carvedilol (COREG) 25 MG tablet Take 25 mg by mouth 2 (two) times daily with a meal.    . co-enzyme Q-10 30 MG capsule Take 30 mg by mouth daily.    . cycloSPORINE (RESTASIS)  0.05 % ophthalmic emulsion Place 1 drop into both eyes 2 (two) times daily.     . diclofenac Sodium (VOLTAREN) 1 % GEL Apply 4 g topically 4 (four) times daily. 100 g 0  . docusate sodium (COLACE) 100 MG capsule Take 100 mg by mouth daily as needed for mild constipation.    . ferrous sulfate 325 (65 FE) MG tablet Take 325 mg by mouth daily with breakfast.    . gabapentin (NEURONTIN) 300 MG capsule Take 1 capsule (300 mg total) by mouth 3 (three) times daily. Start 300mg  at bedtime x1 week then may increase to three times per day. (Patient taking differently: Take 300 mg by mouth in the morning, at noon, and at bedtime. ) 90 capsule 3  . glimepiride (AMARYL) 4 MG tablet Take 4 mg by mouth daily before breakfast.    . hydrochlorothiazide (HYDRODIURIL) 25 MG tablet Take 25 mg by mouth daily.    Marland Kitchen morphine (MSIR) 15 MG tablet Take 0.5 tablets (7.5 mg total) by mouth every 4 (four) hours as needed for severe pain. 5 tablet 0  . predniSONE (DELTASONE) 50 MG tablet Take daily until resolution of gout symptoms. (Patient taking differently: Take 50 mg by mouth daily. Take daily until resolution of gout symptoms.) 10 tablet 0  . sitaGLIPtin (JANUVIA) 50 MG tablet Take 1 tablet (50 mg total) by mouth daily. 30 tablet 0  . spironolactone (ALDACTONE) 25 MG tablet Take 25 mg by mouth daily.    . Continuous Blood Gluc Receiver (FREESTYLE LIBRE 14 DAY READER) DEVI See admin instructions.    . Continuous Blood Gluc Sensor (FREESTYLE LIBRE 14 DAY SENSOR) MISC 1 Device by Does not apply route every 14 (fourteen) days. 6 each 1  . omeprazole (PRILOSEC) 40 MG capsule Take 1 capsule (40 mg total) by mouth 2 (two) times daily. X 1 month, then once daily (Patient not taking: Reported on 03/03/2020) 60 capsule 1     Physical Exam: Blood pressure (!) 155/83, pulse 98, temperature 98.1 F (36.7 C), temperature source Oral, resp. rate 18, height 6\' 2"  (1.88 m), weight 110.3 kg, SpO2 98 %. General: resting comfortably,  appears stated age, no apparent distress Neurological: alert and oriented, no focal deficits, cranial nerves grossly in tact HEENT: normocephalic, no scleral icterus, NG in place draining gastric contents CV: extremities warm and well-perfused Respiratory: normal work of breathing, symmetric chest wall expansion Abdomen: Distended but soft and nontender to palpation. Small umbilical hernia, easily reducible and nontender to palpation with no overlying skin changes. Extremities: warm and well-perfused, no deformities, moving all extremities spontaneously Psychiatric: normal mood and affect Skin: warm and dry, no jaundice, no rashes or lesions   Results for  orders placed or performed during the hospital encounter of 03/03/20 (from the past 48 hour(s))  Culture, blood (routine x 2)     Status: None (Preliminary result)   Collection Time: 03/03/20  7:04 PM   Specimen: BLOOD LEFT HAND  Result Value Ref Range   Specimen Description      BLOOD LEFT HAND Performed at [facility] [address], [place], [place] [address]    Special Requests      BOTTLES DRAWN AEROBIC ONLY Blood Culture adequate volume Performed at [facility] [address], [place], [place] [address]    Culture      NO GROWTH < 12 HOURS Performed at [facility] [address] [place], [place] [address]    Report Status PENDING   Culture, blood (routine x 2)     Status: None (Preliminary result)   Collection Time: 03/03/20  7:06 PM   Specimen: BLOOD RIGHT HAND  Result Value Ref Range   Specimen Description      BLOOD RIGHT HAND Performed at [facility] [address] [place], [place] [address]    Special Requests      BOTTLES DRAWN AEROBIC ONLY Blood Culture adequate volume Performed at [facility] [address] [place], [place] [address]    Culture      NO GROWTH < 12 HOURS Performed at [facility]  [address], [place], [place] [address]    Report Status PENDING   Glucose, capillary     Status: Abnormal   Collection Time: 03/03/20  9:30 PM  Result Value Ref Range   Glucose-Capillary 225 (H) 70 - 99 mg/dL    Comment: Glucose reference range applies only to samples taken after fasting for at least 8 hours.  Basic metabolic panel     Status: Abnormal   Collection Time: 03/04/20  5:37 AM  Result Value Ref Range   Sodium 135 135 - 145 mmol/L   Potassium 4.7 3.5 - 5.1 mmol/L   Chloride 103 98 - 111 mmol/L   CO2 18 (L) 22 - 32 mmol/L   Glucose, Bld 198 (H) 70 - 99 mg/dL    Comment: Glucose reference range applies only to samples taken after fasting for at least 8 hours.   BUN 91 (H) 8 - 23 mg/dL   Creatinine, Ser 2.20 (H) 0.61 - 1.24 mg/dL   Calcium 8.6 (L) 8.9 - 10.3 mg/dL   GFR, Estimated 31 (L) >60 mL/min    Comment: (NOTE) Calculated using the CKD-EPI Creatinine Equation (2021)    Anion gap 14 5 - 15    Comment: Performed at [facility], [place] [address] [place], [place] [address]  CBC     Status: Abnormal   Collection Time: 03/04/20  5:37 AM  Result Value Ref Range   WBC 8.2 4.0 - 10.5 K/uL   RBC 3.33 (L) 4.22 - 5.81 MIL/uL   Hemoglobin 9.8 (L) 13.0 - 17.0 g/dL   HCT 30.9 (L) 39 - 52 %   MCV 92.8 80.0 - 100.0 fL   MCH 29.4 26.0 - 34.0 pg   MCHC 31.7 30.0 - 36.0 g/dL   RDW 14.5 11.5 - 15.5 %   Platelets 596 (H) 150 - 400 K/uL   nRBC 0.0 0.0 - 0.2 %    Comment: Performed at [facility], [place] [address] [place], [place] [address]  Glucose, capillary  Status: Abnormal   Collection Time: 03/04/20  7:27 AM  Result Value Ref Range   Glucose-Capillary 176 (H) 70 - 99 mg/dL    Comment: Glucose reference range applies only to samples taken after fasting for at least 8 hours.  Glucose, capillary     Status: Abnormal   Collection Time: 03/04/20 11:39 AM  Result Value Ref Range   Glucose-Capillary 213 (H) 70 - 99 mg/dL    Comment: Glucose  reference range applies only to samples taken after fasting for at least 8 hours.  Protime-INR     Status: None   Collection Time: 03/04/20 12:52 PM  Result Value Ref Range   Prothrombin Time 13.9 11.4 - 15.2 seconds   INR 1.1 0.8 - 1.2    Comment: (NOTE) INR goal varies based on device and disease states. Performed at [facility], [place] [address] [place], [place] [address]   Glucose, capillary     Status: Abnormal   Collection Time: 03/04/20  4:03 PM  Result Value Ref Range   Glucose-Capillary 172 (H) 70 - 99 mg/dL    Comment: Glucose reference range applies only to samples taken after fasting for at least 8 hours.  Glucose, capillary     Status: Abnormal   Collection Time: 03/04/20 10:10 PM  Result Value Ref Range   Glucose-Capillary 144 (H) 70 - 99 mg/dL    Comment: Glucose reference range applies only to samples taken after fasting for at least 8 hours.   CT ABDOMEN PELVIS WO CONTRAST  Result Date: 03/04/2020 CLINICAL DATA:  Nausea vomiting. EXAM: CT ABDOMEN AND PELVIS WITHOUT CONTRAST TECHNIQUE: Multidetector CT imaging of the abdomen and pelvis was performed following the standard protocol without IV contrast. COMPARISON:  CT abdomen pelvis 01/14/2020 FINDINGS: Lines and tubes: Enteric tube noted with tip within the gastric lumen and side port at the gastroesophageal junction. Bilateral ureteral stents with proximal pigtails in a superior pole calyx and distal pigtails not visualized due to streak artifact originating from bilateral femoral surgical hardware. Distal pigtails of the ureteral stents terminate in the expected region of the urinary bladder. Lower chest: Coronary artery calcifications. Trace pericardial effusion. No acute abnormality. Hepatobiliary: No focal liver abnormality. No gallstones, gallbladder wall thickening, or pericholecystic fluid. No biliary dilatation. Pancreas: No focal lesion. Normal pancreatic contour. No surrounding inflammatory  changes. No main pancreatic ductal dilatation. Spleen: Normal in size. There is a 1.6 cm fluid density lesion within the spleen likely represents a simple cyst. Otherwise no focal abnormality. Adrenals/Urinary Tract: No adrenal nodule bilaterally. Couple of tiny foci of gas noted within the collecting systems bilaterally. No nephrolithiasis, no hydronephrosis, and no contour-deforming renal mass. No ureterolithiasis or hydroureter. The urinary bladder is unremarkable. Stomach/Bowel: Stomach is within normal limits. The proximal mid small bowel is mildly distended with fluid of to 3.2 cm in caliber. No associated pneumatosis. There is suggestion of a possible transition point within the anterior abdomen at the level of the umbilicus. The distal small bowel is collapsed. No evidence of large bowel wall thickening or dilatation. Appendix appears normal. Vascular/Lymphatic: No abdominal aorta or iliac aneurysm. Severe atherosclerotic plaque of the aorta and its branches. No abdominal, pelvic, or inguinal lymphadenopathy. Reproductive: Not visualized due to streak artifact originating from bilateral its femoral surgical hardware. Other: No intraperitoneal free fluid. No intraperitoneal free gas. No organized fluid collection. Musculoskeletal: Tiny Richter hernia containing the anti mesenteric wall of a very short segment of small bowel (where the possible transition point  is identified). No suspicious lytic or blastic osseous lesions. No acute displaced fracture. Multilevel degenerative changes of the spine. Partially visualized bilateral total hip arthroplasties. IMPRESSION: 1. Tiny Richter hernia containing a very short loop of small bowel with an associated early/partial small bowel obstruction. 2. Couple of tiny foci of gas noted within the collecting systems bilaterally. Findings could be related to recent ureteral stent placement versus could represent emphysematous pyelitis. Recommend correlation with urinalysis  for urinary tract infection. 3. Enteric tube with tip within the gastric lumen and side port at the gastroesophageal junction. Consider advancing by 3-5cm. 4.  Aortic Atherosclerosis (ICD10-I70.0). Electronically Signed   By: Iven Finn M.D.   On: 03/04/2020 21:09   DG Abd 1 View  Result Date: 03/04/2020 CLINICAL DATA:  NG tube placement EXAM: ABDOMEN - 1 VIEW COMPARISON:  03/03/2020 FINDINGS: An enteric tube is present with tip in the left upper quadrant consistent with location in the upper stomach although the proximal side hole projects over the distal esophagus. Mild gaseous distention of the stomach. Gaseous distention of small bowel is demonstrated in the upper abdomen. IMPRESSION: Enteric tube tip projects over the upper stomach with proximal side hole over the distal esophagus. Electronically Signed   By: Lucienne Capers M.D.   On: 03/04/2020 02:02   DG Abd 1 View  Result Date: 03/03/2020 CLINICAL DATA:  Abdominal distension EXAM: ABDOMEN - 1 VIEW COMPARISON:  02/24/2020 FINDINGS: Diffusely gas distended bowel, including a loop of dilated small bowel in the left mid abdomen. Bilateral nephroureteral stents. IMPRESSION: Diffusely gas distended bowel, including a loop of dilated small bowel in the left mid abdomen. Electronically Signed   By: Ulyses Jarred M.D.   On: 03/03/2020 23:21   [place] FNA SOFT TISSUE  Result Date: 03/04/2020 INDICATION: History of left total hip replacement, now with concern for infection. Please perform ultrasound-guided aspiration and/or drainage catheter placement for infection source control purposes. EXAM: ULTRASOUND-GUIDED DRAINAGE CATHETER PLACEMENT INTO DOMINANT FLUID COLLECTION INVOLVING THE PROXIMAL LATERAL ASPECT THE LEFT THIGH COMPARISON:  Left hip CT-03/01/2020 MEDICATIONS: The patient is currently admitted to the hospital and receiving intravenous antibiotics. The antibiotics were administered within an appropriate time frame prior to the initiation of  the procedure. ANESTHESIA/SEDATION: Moderate (conscious) sedation was employed during this procedure. A total of Versed 2 mg and Fentanyl 100 mcg was administered intravenously. Moderate Sedation Time: 18 minutes. The patient's level of consciousness and vital signs were monitored continuously by radiology nursing throughout the procedure under my direct supervision. CONTRAST:  None COMPLICATIONS: None immediate. PROCEDURE: Informed written consent was obtained from the patient after a discussion of the risks, benefits and alternatives to treatment. The patient was placed supine on his hospital bed with ultrasound imaging demonstrating a large (at least 7.6 x 1.6 cm complex fluid collection with the subcutaneous tissues involving the lateral aspect of the proximal left thigh (representative image 2). The procedure was planned. A timeout was performed prior to the initiation of the procedure. The skin overlying the lateral aspect the left thigh was prepped and draped in the usual sterile fashion. The overlying soft tissues were anesthetized with 1% lidocaine with epinephrine. Utilizing a caudal to cranial approach, an 18 gauge trocar needle was advanced through near the entirety of the abscess/fluid collection within the left lateral thigh and a short Amplatz super stiff wire was coiled within the cranial component of the collection. Appropriate positioning was confirmed ultrasound imaging. The tract was serially dilated allowing placement of a 12 [place]  all-purpose drainage catheter. Appropriate positioning was confirmed with a limited postprocedural CT scan. Next, approximately 50 cc of purulent fluid was aspirated. The tube was connected to a JP bulb and sutured in place. A dressing was placed. The patient tolerated the procedure well without immediate post procedural complication. IMPRESSION: Successful CT guided placement of a 30 French all purpose drain catheter into the complex fluid collection involving the  proximal lateral aspect the left thigh with aspiration of 50 cc of purulent fluid. Samples were sent to the laboratory as requested by the ordering clinical team. Electronically Signed   By: Sandi Mariscal M.D.   On: 03/04/2020 15:43      Assessment/Plan 74 yo male admitted with a left hip abscess, with abdominal distension and small bowel distension. I reviewed his CT scan - there is dilation of the proximal small bowel with decompressed distal small bowel. There is a small loop of bowel within the umbilical hernia but this was present on a prior CT in Oct 2021 and the hernia is easily reducible on exam. He is nontoxic without abdominal tenderness so my suspicion for strangulated bowel is very low, and his obstruction is not related to the hernia. He endorses flatus so this seems to be a partial obstruction. Etiology unclear as his only prior surgery is an inguinal hernia repair, so adhesions are unlikely.  - Begin SBO protocol. Patient has had about 24 hours of NG decompression, proceed with oral contrast administration and obtain plain film in 8 hours. - Clamp NG for 1 hour after oral contrast, otherwise continue decompression with low intermittent suction - IV fluid hydration - General surgery will continue to follow  Michaelle Birks, [facility], Hepatobiliary and Pancreatic Surgery 03/04/20 10:30 PM

## 2020-03-04 NOTE — Consult Note (Signed)
   [facility] Inpatient Consult   03/04/2020  PRATT BRESS [birth year] [id number]   [facility] [ACO] Patient:  [employer] C-SNP member  Patient is currently active in the [facility] C-SNP program for chronic care management services.  Patient has been engaged by a [place] Coordinator.  Our community based plan of care has focused on disease management and community resource support.    Plan: Will follow for progress and post hospital disposition and needs and give updates to [facility] RN Chronic Care Coordinator. Inpatient Transition Of Care [TOC] team member has been made aware that patient is active with [place] Coordinator and following for progress.  Of note, [facility] Care Management services does not replace or interfere with any services that are needed or arranged by inpatient [facility] care management team.    For additional questions or referrals please contact:  Natividad Brood, RN BSN [facility] Liaison  [phone] business mobile phone Toll free office [phone]  Fax number: [phone] [email or web address]

## 2020-03-04 NOTE — Consult Note (Signed)
Patient ID: [name] MRN: [id number] DOB/AGE: [birth year] 74 y.o.  Admit date: 03/03/2020  Chief Complaint: Left hip pain  Subjective: [name], 74 year old male, presented to [facility] on 03/03/2020 following discharge from [facility] Emergency Department two days prior. Past medical history is significant for left total hip arthroplasty (2001), CKD, CAD and diabetes mellitus. Left hip pain began around a week prior while ambulating, prompting a trip to the ED. Workup was significnat for ESR > 140 and CRP of 21.3. Cobalt/chromimum results are still pending. WBC count was within normal limits. CT scan obtained at that time showed a large fluid collection around the femoral component with a fluid air level. There was potential loseening of the femoral component and questionable osteomyelitis and abscess.  His left hip pain worsened following discharge, at his office visit yesterday, he reported chills, diarrhea and occasional hot flashes. No history of fevers. Denies any recent infection. It was determined due to his multiple cardiac and renal issues, new onset diarrhea and the extent of his pain that he should be admitted to [facility]. He was admitted last night by the hospitalist service, with orthopedics consulting for management of the left hip.  [name] is resting comfortably in bed this AM. Reports his hip pain is much improved (on morphine 7.5 mg PO). Any movement of the left hip is painful.   Allergies: Allergies  Allergen Reactions  . Shellfish Allergy Rash     Medications: Medications Prior to Admission  Medication Sig Dispense Refill Last Dose  . albuterol (VENTOLIN HFA) 108 (90 Base) MCG/ACT inhaler Inhale 2 puffs into the lungs every 6 (six) hours as needed for wheezing or shortness of breath. 6.7 g 6 Past Month at Unknown time  . amLODipine (NORVASC) 10 MG tablet Take 10 mg by mouth daily.   03/03/2020 at Unknown time  . Aspirin 81 MG CAPS Take 81 mg  by mouth daily.    03/03/2020 at Unknown time  . atorvastatin (LIPITOR) 40 MG tablet Take 1 tablet (40 mg total) by mouth daily. 30 tablet 0 03/03/2020 at Unknown time  . carvedilol (COREG) 25 MG tablet Take 25 mg by mouth 2 (two) times daily with a meal.   03/03/2020 at 12 noon  . co-enzyme Q-10 30 MG capsule Take 30 mg by mouth daily.   03/03/2020 at Unknown time  . cycloSPORINE (RESTASIS) 0.05 % ophthalmic emulsion Place 1 drop into both eyes 2 (two) times daily.    Past Month at Unknown time  . diclofenac Sodium (VOLTAREN) 1 % GEL Apply 4 g topically 4 (four) times daily. 100 g 0 03/03/2020 at Unknown time  . docusate sodium (COLACE) 100 MG capsule Take 100 mg by mouth daily as needed for mild constipation.   02/18/2020  . ferrous sulfate 325 (65 FE) MG tablet Take 325 mg by mouth daily with breakfast.   03/03/2020 at Unknown time  . gabapentin (NEURONTIN) 300 MG capsule Take 1 capsule (300 mg total) by mouth 3 (three) times daily. Start 373m at bedtime x1 week then may increase to three times per day. (Patient taking differently: Take 300 mg by mouth in the morning, at noon, and at bedtime. ) 90 capsule 3 03/03/2020 at Unknown time  . glimepiride (AMARYL) 4 MG tablet Take 4 mg by mouth daily before breakfast.   03/03/2020 at Unknown time  . hydrochlorothiazide (HYDRODIURIL) 25 MG tablet Take 25 mg by mouth daily.   03/03/2020 at Unknown time  .  morphine (MSIR) 15 MG tablet Take 0.5 tablets (7.5 mg total) by mouth every 4 (four) hours as needed for severe pain. 5 tablet 0 03/03/2020 at Unknown time  . predniSONE (DELTASONE) 50 MG tablet Take daily until resolution of gout symptoms. (Patient taking differently: Take 50 mg by mouth daily. Take daily until resolution of gout symptoms.) 10 tablet 0 03/03/2020 at Unknown time  . sitaGLIPtin (JANUVIA) 50 MG tablet Take 1 tablet (50 mg total) by mouth daily. 30 tablet 0 03/03/2020 at Unknown time  . spironolactone (ALDACTONE) 25 MG tablet Take 25 mg by  mouth daily.   03/03/2020 at Unknown time  . Continuous Blood Gluc Receiver (FREESTYLE LIBRE 14 DAY READER) DEVI See admin instructions.     . Continuous Blood Gluc Sensor (FREESTYLE LIBRE 14 DAY SENSOR) MISC 1 Device by Does not apply route every 14 (fourteen) days. 6 each 1   . omeprazole (PRILOSEC) 40 MG capsule Take 1 capsule (40 mg total) by mouth 2 (two) times daily. X 1 month, then once daily (Patient not taking: Reported on 03/03/2020) 60 capsule 1 Not Taking at Unknown time    Past Medical History: Past Medical History:  Diagnosis Date  . Arthritis   . Asthma    no inhaler  . Coronary artery disease    cardiologist-  dr spruill; last visit 3 mos ago per pt  . GERD (gastroesophageal reflux disease)   . History of MI (myocardial infarction)    1985  . Hydronephrosis, left   . Hypertension   . Myocardial infarction ([place])   . Nephrolithiasis    left  . 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 Presence of tooth-root and mandibular implants    lower dental implants  . Prostate cancer ([place])   . Shortness of breath    WITH EXERTION  . Type 2 diabetes mellitus ([place])      Past Surgical History: Past Surgical History:  Procedure Laterality Date  . BUNIONECTOMY    . CYSTOSCOPY W/ RETROGRADES Left 03/14/2014   Procedure: CYSTOSCOPY WITH LEFT RETROGRADE PYELOGRAM, Left Ureteroscopy, [place] ureteral Stent No string;  Surgeon: [name], MD;  Location: [facility];  Service: Urology;  Laterality: Left;  . CYSTOSCOPY W/ URETERAL STENT PLACEMENT Bilateral 01/15/2020   Procedure: CYSTOSCOPY WITH RETROGRADE PYELOGRAM/URETERAL STENT PLACEMENT;  Surgeon: Alexis Frock, MD;  Location: [facility];  Service: Urology;  Laterality: Bilateral;  . HERNIA REPAIR    . PROSTATE BIOPSY    . TOTAL HIP ARTHROPLASTY Left 03-20-2009  . TOTAL HIP ARTHROPLASTY  04/09/2012   Procedure: TOTAL HIP ARTHROPLASTY;  Surgeon: [name], MD;  Location: [facility];  Service:  Orthopedics;  Laterality: Right;  . TOTAL KNEE ARTHROPLASTY Left 05-16-2008     Family History: Family History  Problem Relation Age of Onset  . Cancer Mother        breast  . Multiple sclerosis Daughter   . Cancer Father        prostate  . Cancer Maternal Uncle        bone    Social History: Social History   Tobacco Use  . Smoking status: Former Smoker    Packs/day: 1.00    Years: 25.00    Pack years: 25.00    Types: Cigarettes    Quit date: 04/11/1984    Years since quitting: 35.9  . Smokeless tobacco: Former Systems developer    Quit date: 04/02/1985  Substance Use Topics  .  Alcohol use: Yes    Comment: RARE     Review of Systems Constitutional: positive for chills Eyes: negative Respiratory: negative Cardiovascular: negative Gastrointestinal: positive for diarrhea Musculoskeletal:positive for left hip pain Neurological: negative Behavioral/Psych: negative  Physical Exam:  General: alert and no distress HENT:Head: Normocephalic, no lesions, without obvious abnormality. Neck:supple Chest:clear to auscultation, no wheezes, rales or rhonchi, symmetric air entry Heart:S1, S2 normal, no murmur, rub or gallop, regular rate and rhythm Abdomen: Distended Rectal/Breast/Genitalia: Not done, not pertinent to present illness.  Musculoskeletal:Neurologically intact Neurovascular intact Sensation intact distally Intact pulses distally No cellulitis present  Pain with any attempted passive movement of the left hip.    LABS: Results for orders placed or performed during the hospital encounter of 03/03/20  Culture, blood (routine x 2)   Specimen: BLOOD LEFT HAND  Result Value Ref Range   Specimen Description      BLOOD LEFT HAND Performed at [facility] [address], [place], [place] [address]    Special Requests      BOTTLES DRAWN AEROBIC ONLY Blood Culture adequate volume Performed at [facility] [address]  [place], [place] [address]    Culture      NO GROWTH < 12 HOURS Performed at [facility] [address], [place], [place] [address]    Report Status PENDING   Culture, blood (routine x 2)   Specimen: BLOOD RIGHT HAND  Result Value Ref Range   Specimen Description      BLOOD RIGHT HAND Performed at [facility] [address] [place], [place] [address]    Special Requests      BOTTLES DRAWN AEROBIC ONLY Blood Culture adequate volume Performed at [facility] [address], [place], [place] [address]    Culture      NO GROWTH < 12 HOURS Performed at [facility] [address], [place], [place] [address]    Report Status PENDING   Basic metabolic panel  Result Value Ref Range   Sodium 135 135 - 145 mmol/L   Potassium 4.7 3.5 - 5.1 mmol/L   Chloride 103 98 - 111 mmol/L   CO2 18 (L) 22 - 32 mmol/L   Glucose, Bld 198 (H) 70 - 99 mg/dL   BUN 91 (H) 8 - 23 mg/dL   Creatinine, Ser 2.20 (H) 0.61 - 1.24 mg/dL   Calcium 8.6 (L) 8.9 - 10.3 mg/dL   GFR, Estimated 31 (L) >60 mL/min   Anion gap 14 5 - 15  CBC  Result Value Ref Range   WBC 8.2 4.0 - 10.5 K/uL   RBC 3.33 (L) 4.22 - 5.81 MIL/uL   Hemoglobin 9.8 (L) 13.0 - 17.0 g/dL   HCT 30.9 (L) 39 - 52 %   MCV 92.8 80.0 - 100.0 fL   MCH 29.4 26.0 - 34.0 pg   MCHC 31.7 30.0 - 36.0 g/dL   RDW 14.5 11.5 - 15.5 %   Platelets 596 (H) 150 - 400 K/uL   nRBC 0.0 0.0 - 0.2 %  Glucose, capillary  Result Value Ref Range   Glucose-Capillary 225 (H) 70 - 99 mg/dL  Glucose, capillary  Result Value Ref Range   Glucose-Capillary 176 (H) 70 - 99 mg/dL   Recent Labs    03/01/20 0925 03/04/20 0537  HGB 8.6* 9.8*   Recent Labs    03/01/20 0925 03/04/20 0537  WBC 10.1 8.2  RBC 2.83* 3.33*  HCT 26.3* 30.9*  PLT 414* 596*   Recent Labs    03/01/20 0925 03/04/20 0537  NA 133* 135  K 4.1 4.7  CL 106 103  CO2 18* 18*  BUN 59* 91*  CREATININE 2.17* 2.20*  GLUCOSE 209* 198*    CALCIUM 7.9* 8.6*   No results for input(s): LABPT, INR in the last 72 hours.  Assessment/Plan: 1. Status post left total hip arthroplasty 2. Fluid collection, left hip   Plan for CT guided aspiration of the left hip and percutaneous catheter placement today with cell count, culture, and gram stain of the fluid. If positive, may warrant surgical intervention. This was discussed with the patient today and he elects to proceed with aspiration.   Theresa Duty, PA-C Orthopedic Surgery [facility] [place]

## 2020-03-04 NOTE — Progress Notes (Signed)
Pharmacy Brief Note - Post-IR Procedure Consult:  Pharmacy consulted to review orders for anticoagulants/antiplatelets and re-time appropriately post-IR procedure.   Assessment/Plan: -Procedure: CT guided placement of drainage catheter -Bleed risk: Standard -Anticoagulant orders: Heparin 5000 units subQ q8h -Timing of next dose: D0; at least 6 hour after procedure or at next standard dosing interval   Heparin 5000 units subQ q8h currently ordered to resume at 2200 this evening by IR. No changes needed.   Pharmacy to sign off.   Lenis Noon, PharmD 03/04/20 3:49 PM

## 2020-03-04 NOTE — Procedures (Signed)
Pre procedural Dx: Infection of left total hip replacement.  Post procedural Dx: Same  Technically successful CT guided placed of a 10 Fr drainage catheter placement into the abscess involving the lateral aspect of the left thight yielding 50 cc of purulent appearing fluid.   A representative aspirated sample was capped and sent to the laboratory for analysis.    EBL: Trace Complications: None immediate  Aaron Bacon, MD Pager #: [phone]

## 2020-03-04 NOTE — Progress Notes (Signed)
PROGRESS NOTE    [name]  AQT:[id number] DOB: [birth year] DOA: 03/03/2020 PCP: Aaron Nutting, DO   No chief complaint on file.  Brief Narrative:  [name] is [name] 74 y.o. male with medical history significant of coronary artery disease, diabetes type 2, hypertension, anemia, CKD stage IIIb, osteoarthritis status post left hip arthroplasty, renal stone/hydronephrosis status post stents, prostate cancer ,paroxysmal atrial flutter not on anticoagulation who was sent for direct admission to [facility] from [facility] office,Aaron [name].  Patient was seen in the emergency department on 03/01/2020 for left hip pain.  There was no report of injury.  He had CT left hip done which showed  fluid collection.  He was found to have elevated ESR and CRP.  He was discharged from the emergency department to follow-up with orthopedics as per orthopedics recommendation.   In the orthopedics office, he reported chills, nausea/diarrhea and increased left hip pain.  He was hardly able to walk.  He was afebrile.  Patient was requested for direct admission by orthopedics team for drainage or intervention of the left hip fluid collection.  Lab work also showed elevated ESR and CRP. Patient seen and examined at the bedside this evening.  During my evaluation, he was hemodynamically stable and overall comfortable.  He was hypertensive.  He complained of severe pain on his left hip and the left ear was found to be tender.  He denied any fever, chest pain, shortness of breath, cough, abdomen pain, nausea, vomiting, dysuria, hematochezia or melena.  He ives alone.  Assessment & Plan:   Active Problems:   OA (osteoarthritis) of hip   Type 2 diabetes mellitus with hypoglycemia without coma (HCC)   Essential hypertension   Hyperlipidemia with target LDL less than 70   Hip pain   Left hip collection  Pain  Concern for infected hardware: CT left hip showed large fluid and air containing  collection surroudning the proximal femoral component measuring up to 8.7 cm, concerning for infection/abscess.  Fluid and air collection track within the L adductor muscle compartment extending towards the symphysis.  Periprosthetic lucency along the posterior cortex of the proximal femoral stem extending distally by approximately 7.5 cm, concerning for septic loosening.  Area of lucency along the anterior aspect of the proximal native femur with air seen within bone, concerning for osteo. Orthopedic c/s, appreciate recs S/p CT guided placed 10 F drainage catheter placement  May need to start abx, ID consult pending cell count/cx results  Abdominal Distention: s/p NG tube placement for diffusely gas distended bowel Follow CT abdomen pelvis May need general surgery c/s Continue NGT to LIS NPO for now  Paroxysmal [name]. fib/atrial flutter: Follows with [name].  Not on any anticoagulation at home because of anemia.  Was on anticoagulation in the past..  On Coreg for rate control.  Currently rate is controlled.  Diabetes type 2:Hemoglobin A1c of 7.1 as per 9/21. SSI - hold home amaryl and januvia Adjust prn  CKD stage IIIb: His kidney function has fluctuated over several months from 1.5-3.  Continue to monitor kidney function.  Avoid nephrotoxins.  He needs to follow-up with nephrology as an outpatient.  History of severe left hydroureteronephrosis/prostate cancer: Follows with urology.  He is status post bilateral ureteral stent placement by Aaron. Jannette [name] 10/6.  Currently prostate cancer is in remission.  Normocytic anemia: Most likely associated  with chronic kidney disease, chronic medical problems.  Hemoglobin currently stable .  No report of hematochezia  or melena.  Coronary artery disease: Takes aspirin.  Currently held for tomorrow's procedure. Denies any chest pain.    Hypertension:  Oral meds on hold with above.  Prn labetalol.  Hyperlipidemia: Takes Lipitor.  GERD: Continue  Protonix  Left hip pain/debility: We will request for PT/OT evaluation after procedure.  DVT prophylaxis: heparin Code Status: full  Family Communication: none at bedside Disposition:   Status is: Observation  The patient will require care spanning > 2 midnights and should be moved to inpatient because: Inpatient level of care appropriate due to severity of illness  Dispo: The patient is from: Home              Anticipated d/c is to: pending              Anticipated d/c date is: > 3 days              Patient currently is not medically stable to d/c.   Consultants:   IR  Ortho  Procedures: IR IMPRESSION: Successful CT guided placement of [name] 12 French all purpose drain catheter into the complex fluid collection involving the proximal lateral aspect the left thigh with aspiration of 50 cc of purulent fluid. Samples were sent to the laboratory as requested by the ordering clinical team.  Antimicrobials: Anti-infectives (From admission, onward)   None      Subjective: No new complaints  Objective: Vitals:   03/04/20 1426 03/04/20 1435 03/04/20 1440 03/04/20 2023  BP: (!) 154/81 (!) 155/70 136/79 (!) 155/83  Pulse: (!) 115 (!) 117 (!) 115 98  Resp: _0 Temp:    98.1 F (36.7 C)  TempSrc:    Oral  SpO2: 100% 100% 100% 98%  Weight:      Height:        Intake/Output Summary (Last 24 hours) at 03/04/2020 2026 Last data filed at 03/04/2020 0956 Gross per 24 hour  Intake 120 ml  Output 900 ml  Net -780 ml   Filed Weights   03/03/20 1855  Weight: 110.3 kg    Examination:  General exam: Appears calm and comfortable  Respiratory system: Clear to auscultation. Respiratory effort normal. Cardiovascular system: S1 & S2 heard, RRR. Gastrointestinal system: Abdomen is distended, nontender, NGT in place Central nervous system: Alert and oriented. No focal neurological deficits. Extremities:LLE edema Skin: No rashes, lesions or ulcers Psychiatry:  Judgement and insight appear normal. Mood & affect appropriate.     Data Reviewed: I have personally reviewed following labs and imaging studies  CBC: Recent Labs  Lab 03/01/20 0925 03/04/20 0537  WBC 10.1 8.2  NEUTROABS 8.5*  --   HGB 8.6* 9.8*  HCT 26.3* 30.9*  MCV 92.9 92.8  PLT 414* 596*    Basic Metabolic Panel: Recent Labs  Lab 03/01/20 0925 03/04/20 0537  NA 133* 135  K 4.1 4.7  CL 106 103  CO2 18* 18*  GLUCOSE 209* 198*  BUN 59* 91*  CREATININE 2.17* 2.20*  CALCIUM 7.9* 8.6*    GFR: Estimated Creatinine Clearance: 38.9 mL/min (Kynzie Polgar) (by C-G formula based on SCr of 2.2 mg/dL (H)).  Liver Function Tests: No results for input(s): AST, ALT, ALKPHOS, BILITOT, PROT, ALBUMIN in the last 168 hours.  CBG: Recent Labs  Lab 03/03/20 2130 03/04/20 0727 03/04/20 1139 03/04/20 1603  GLUCAP 225* 176* 213* 172*     Recent Results (from the past 240 hour(s))  Resp Panel by RT-PCR (Flu Mckade Gurka&B, Covid) Nasopharyngeal Swab  Status: None   Collection Time: 03/01/20 12:20 PM   Specimen: Nasopharyngeal Swab; Nasopharyngeal(NP) swabs in vial transport medium  Result Value Ref Range Status   SARS Coronavirus 2 by RT PCR NEGATIVE NEGATIVE Final    Comment: (NOTE) SARS-CoV-2 target nucleic acids are NOT DETECTED.  The SARS-CoV-2 RNA is generally detectable in upper respiratory specimens during the acute phase of infection. The lowest concentration of SARS-CoV-2 viral copies this assay can detect is 138 copies/mL. Zakiah Beckerman negative result does not preclude SARS-Cov-2 infection and should not be used as the sole basis for treatment or other patient management decisions. Kamauri Kathol negative result may occur with  improper specimen collection/handling, submission of specimen other than nasopharyngeal swab, presence of viral mutation(s) within the areas targeted by this assay, and inadequate number of viral copies(<138 copies/mL). Harrol Novello negative result must be combined with clinical  observations, patient history, and epidemiological information. The expected result is Negative.  Fact Sheet for Patients:  EntrepreneurPulse.com.au  Fact Sheet for Healthcare Providers:  IncredibleEmployment.be  This test is no t yet approved or cleared by the [place] FDA and  has been authorized for detection and/or diagnosis of SARS-CoV-2 by FDA under an Emergency Use Authorization (EUA). This EUA will remain  in effect (meaning this test can be used) for the duration of the COVID-19 declaration under Section 564(b)(1) of the Act, 21 U.S.C.section 360bbb-3(b)(1), unless the authorization is terminated  or revoked sooner.       Influenza Anndrea Mihelich by PCR NEGATIVE NEGATIVE Final   Influenza B by PCR NEGATIVE NEGATIVE Final    Comment: (NOTE) The Xpert Xpress SARS-CoV-2/FLU/RSV plus assay is intended as an aid in the diagnosis of influenza from Nasopharyngeal swab specimens and should not be used as Crockett Rallo sole basis for treatment. Nasal washings and aspirates are unacceptable for Xpert Xpress SARS-CoV-2/FLU/RSV testing.  Fact Sheet for Patients: EntrepreneurPulse.com.au  Fact Sheet for Healthcare Providers: IncredibleEmployment.be  This test is not yet approved or cleared by the [place] FDA and has been authorized for detection and/or diagnosis of SARS-CoV-2 by FDA under an Emergency Use Authorization (EUA). This EUA will remain in effect (meaning this test can be used) for the duration of the COVID-19 declaration under Section 564(b)(1) of the Act, 21 U.S.C. section 360bbb-3(b)(1), unless the authorization is terminated or revoked.  Performed at [facility], [place] [address], [place], [place] [address]   Culture, blood (routine x 2)     Status: None (Preliminary result)   Collection Time: 03/03/20  7:04 PM   Specimen: BLOOD LEFT HAND  Result Value Ref Range Status   Specimen  Description   Final    BLOOD LEFT HAND Performed at [facility] [address] [place], [place] [address]    Special Requests   Final    BOTTLES DRAWN AEROBIC ONLY Blood Culture adequate volume Performed at [facility] [address], [place], [place] [address]    Culture   Final    NO GROWTH < 12 HOURS Performed at [facility] [address], [place], [place] [address]    Report Status PENDING  Incomplete  Culture, blood (routine x 2)     Status: None (Preliminary result)   Collection Time: 03/03/20  7:06 PM   Specimen: BLOOD RIGHT HAND  Result Value Ref Range Status   Specimen Description   Final    BLOOD RIGHT HAND Performed at [facility] [address] [place], [place] [address]  Special Requests   Final    BOTTLES DRAWN AEROBIC ONLY Blood Culture adequate volume Performed at [facility] [address] [place], [place] [address]    Culture   Final    NO GROWTH < 12 HOURS Performed at [facility] [address] [place], [place] [address]    Report Status PENDING  Incomplete         Radiology Studies: DG Abd 1 View  Result Date: 03/04/2020 CLINICAL DATA:  NG tube placement EXAM: ABDOMEN - 1 VIEW COMPARISON:  03/03/2020 FINDINGS: An enteric tube is present with tip in the left upper quadrant consistent with location in the upper stomach although the proximal side hole projects over the distal esophagus. Mild gaseous distention of the stomach. Gaseous distention of small bowel is demonstrated in the upper abdomen. IMPRESSION: Enteric tube tip projects over the upper stomach with proximal side hole over the distal esophagus. Electronically Signed   By: Lucienne Capers M.D.   On: 03/04/2020 02:02   DG Abd 1 View  Result Date: 03/03/2020 CLINICAL DATA:  Abdominal distension EXAM: ABDOMEN - 1 VIEW COMPARISON:  02/24/2020 FINDINGS: Diffusely gas distended bowel,  including Hussien Greenblatt loop of dilated small bowel in the left mid abdomen. Bilateral nephroureteral stents. IMPRESSION: Diffusely gas distended bowel, including Rosalena Mccorry loop of dilated small bowel in the left mid abdomen. Electronically Signed   By: Ulyses Jarred M.D.   On: 03/03/2020 23:21   [place] FNA SOFT TISSUE  Result Date: 03/04/2020 INDICATION: History of left total hip replacement, now with concern for infection. Please perform ultrasound-guided aspiration and/or drainage catheter placement for infection source control purposes. EXAM: ULTRASOUND-GUIDED DRAINAGE CATHETER PLACEMENT INTO DOMINANT FLUID COLLECTION INVOLVING THE PROXIMAL LATERAL ASPECT THE LEFT THIGH COMPARISON:  Left hip CT-03/01/2020 MEDICATIONS: The patient is currently admitted to the hospital and receiving intravenous antibiotics. The antibiotics were administered within an appropriate time frame prior to the initiation of the procedure. ANESTHESIA/SEDATION: Moderate (conscious) sedation was employed during this procedure. Overland Jon total of Versed 2 mg and Fentanyl 100 mcg was administered intravenously. Moderate Sedation Time: 18 minutes. The patient's level of consciousness and vital signs were monitored continuously by radiology nursing throughout the procedure under my direct supervision. CONTRAST:  None COMPLICATIONS: None immediate. PROCEDURE: Informed written consent was obtained from the patient after Amica Harron discussion of the risks, benefits and alternatives to treatment. The patient was placed supine on his hospital bed with ultrasound imaging demonstrating Yesennia Hirota large (at least 7.6 x 1.6 cm complex fluid collection with the subcutaneous tissues involving the lateral aspect of the proximal left thigh (representative image 2). The procedure was planned. Leinani Lisbon timeout was performed prior to the initiation of the procedure. The skin overlying the lateral aspect the left thigh was prepped and draped in the usual sterile fashion. The overlying soft tissues were  anesthetized with 1% lidocaine with epinephrine. Utilizing Lawren Sexson caudal to cranial approach, an 18 gauge trocar needle was advanced through near the entirety of the abscess/fluid collection within the left lateral thigh and [place] short Amplatz super stiff wire was coiled within the cranial component of the collection. Appropriate positioning was confirmed ultrasound imaging. The tract was serially dilated allowing placement of Luster Hechler 12 [place] all-purpose drainage catheter. Appropriate positioning was confirmed with Susanne Baumgarner limited postprocedural CT scan. Next, approximately 50 cc of purulent fluid was aspirated. The tube was connected to Merlen Gurry JP bulb and sutured in place. Kristol Almanzar dressing was placed. The patient tolerated the procedure well without immediate  post procedural complication. IMPRESSION: Successful CT guided placement of Ericia Moxley 68 French all purpose drain catheter into the complex fluid collection involving the proximal lateral aspect the left thigh with aspiration of 50 cc of purulent fluid. Samples were sent to the laboratory as requested by the ordering clinical team. Electronically Signed   By: Sandi Mariscal M.D.   On: 03/04/2020 15:43        Scheduled Meds: . amLODipine  10 mg Oral Daily  . atorvastatin  40 mg Oral Daily  . carvedilol  25 mg Oral BID WC  . diclofenac Sodium  4 g Topical QID  . fentaNYL      . ferrous sulfate  325 mg Oral Q breakfast  . gabapentin  300 mg Oral TID  . heparin  5,000 Units Subcutaneous Q8H  . insulin aspart  0-15 Units Subcutaneous TID WC  . insulin aspart  0-5 Units Subcutaneous QHS  . lidocaine-EPINEPHrine      . midazolam      . pantoprazole  40 mg Oral Daily   Continuous Infusions:   LOS: 1 day    Time spent: over 30 min    Fayrene Helper, MD [facility]   To contact the attending provider between 7A-7P or the covering provider during after hours 7P-7A, please log into the web site [email or web address] and access using universal [place] password for that web  site. If you do not have the password, please call the hospital operator.  03/04/2020, 8:26 PM

## 2020-03-04 NOTE — Progress Notes (Signed)
Advancement of NG tube about 3 cm done per Dr. Florene Glen order.

## 2020-03-04 NOTE — Plan of Care (Signed)
  Problem: Education: Goal: Knowledge of General Education information will improve Description: Including pain rating scale, medication(s)/side effects and non-pharmacologic comfort measures Outcome: Progressing   Problem: Clinical Measurements: Goal: Will remain free from infection Outcome: Progressing Goal: Diagnostic test results will improve Outcome: Progressing   Problem: Activity: Goal: Risk for activity intolerance will decrease Outcome: Progressing   Problem: Nutrition: Goal: Adequate nutrition will be maintained Outcome: Progressing   Problem: Pain Managment: Goal: General experience of comfort will improve Outcome: Progressing   Problem: Safety: Goal: Ability to remain free from injury will improve Outcome: Progressing   Problem: Skin Integrity: Goal: Risk for impaired skin integrity will decrease Outcome: Progressing

## 2020-03-04 NOTE — Progress Notes (Signed)
    BRIEF OVERNIGHT PROGRESS REPORT  SUBJECTIVE: Patient c/o abdominal pain associated with nausea and vomiting. Per RN, pt described symptoms as gas pains. Noted with clear vomitus approximately 200 cc.  OBJECTIVE: On exam, he was afebrile with blood pressure 152/74 mm Hg and pulse rate 81 beats/min. There were no focal neurological deficits; he was alert and oriented x4. Abdomen diffusely distended with hypoactive bowel sounds.    ASSESSMENT AND PLAN:  Abdominal pain -STAT KUB obtained and showed Diffusely gas distended bowel, including a loop of dilated small bowel in the left mid abdomen. -NGT placed for decompression ILS -Keep NPO -IVFS -Follow up with CT abdomen if with worsening symptoms to evaluated ileus/bowel obstruction. General surgery consult if appropriate or with worsening symptoms.     Rufina Falco, DNP, CCRN, FNP-C [facility] Nurse Practitioner Between 7pm to 7am - Pager [phone]  After 7am go to [email or web address] - password:[email or web address] select [facility]  [facility] [employer]  [phone]

## 2020-03-04 NOTE — Consult Note (Signed)
Chief Complaint: Patient was seen in consultation today for US guided aspiration/possible drainage of left hip fluid collection  Referring Physician(s): Aluisio,F  Supervising Physician: Sandi Mariscal  Patient Status: [facility] - In-pt  History of Present Illness: [name] is a 74 y.o. male with past medical history of osteoarthritis, anemia,  asthma, coronary artery disease with prior MI, GERD, nephrolithiasis, hypertension, PAF, chronic kidney disease, prostate cancer, diabetes and left total hip arthroplasty in 2001 who was admitted to [facility] 11/23  with persistent left hip pain, occasional chills, hot flashes.  He was seen in the ER at [facility] on 11/21 for left hip pain with CT showing large air/ fluid collection in region. He was also found to have elevated sed rate and CRP.  He was subsequently discharged from the ED, followed up with Ortho and subsequently referred back to [facility] ED for admission .  Imaging also revealed periprosthetic lucency along the posterior cortex of the proximal femoral stem extending distally approximately 7.5 cm concerning for septic loosening.  Also there was additional area of lucency along the anterior aspect of the proximal right femur with air seen within the bone concerning for osteomyelitis.  Request now received from Ortho for ultrasound-guided aspiration and possible drainage of the left hip air/fluid collection.  Past Medical History:  Diagnosis Date  . Arthritis   . Asthma    no inhaler  . Coronary artery disease    cardiologist-  dr spruill; last visit 3 mos ago per pt  . GERD (gastroesophageal reflux disease)   . History of MI (myocardial infarction)    1985  . Hydronephrosis, left   . Hypertension   . Myocardial infarction ([place])   . Nephrolithiasis    left  . 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 Presence of tooth-root and mandibular implants    lower dental implants  . Prostate cancer  ([place])   . Shortness of breath    WITH EXERTION  . Type 2 diabetes mellitus ([place])     Past Surgical History:  Procedure Laterality Date  . BUNIONECTOMY    . CYSTOSCOPY W/ RETROGRADES Left 03/14/2014   Procedure: CYSTOSCOPY WITH LEFT RETROGRADE PYELOGRAM, Left Ureteroscopy, [place] ureteral Stent No string;  Surgeon: [name], MD;  Location: [facility];  Service: Urology;  Laterality: Left;  . CYSTOSCOPY W/ URETERAL STENT PLACEMENT Bilateral 01/15/2020   Procedure: CYSTOSCOPY WITH RETROGRADE PYELOGRAM/URETERAL STENT PLACEMENT;  Surgeon: Alexis Frock, MD;  Location: [facility];  Service: Urology;  Laterality: Bilateral;  . HERNIA REPAIR    . PROSTATE BIOPSY    . TOTAL HIP ARTHROPLASTY Left 03-20-2009  . TOTAL HIP ARTHROPLASTY  04/09/2012   Procedure: TOTAL HIP ARTHROPLASTY;  Surgeon: [name], MD;  Location: [facility];  Service: Orthopedics;  Laterality: Right;  . TOTAL KNEE ARTHROPLASTY Left 05-16-2008    Allergies: Shellfish allergy  Medications: Prior to Admission medications   Medication Sig Start Date End Date Taking? Authorizing Provider  albuterol (VENTOLIN HFA) 108 (90 Base) MCG/ACT inhaler Inhale 2 puffs into the lungs every 6 (six) hours as needed for wheezing or shortness of breath. 08/09/19 03/03/20 Yes Luetta Nutting, DO  amLODipine (NORVASC) 10 MG tablet Take 10 mg by mouth daily.   Yes [provider]  Aspirin 81 MG CAPS Take 81 mg by mouth daily.    Yes [provider]  atorvastatin (LIPITOR) 40 MG tablet Take 1 tablet (40 mg total)  by mouth daily. 06/26/16  Yes Tat, Shanon Brow, MD  carvedilol (COREG) 25 MG tablet Take 25 mg by mouth 2 (two) times daily with a meal.   Yes [provider]  co-enzyme Q-10 30 MG capsule Take 30 mg by mouth daily.   Yes [provider]  cycloSPORINE (RESTASIS) 0.05 % ophthalmic emulsion Place 1 drop into both eyes 2 (two) times daily.  09/23/19  Yes [provider]  diclofenac Sodium  (VOLTAREN) 1 % GEL Apply 4 g topically 4 (four) times daily. 03/01/20  Yes Deno Etienne, DO  docusate sodium (COLACE) 100 MG capsule Take 100 mg by mouth daily as needed for mild constipation.   Yes [provider]  ferrous sulfate 325 (65 FE) MG tablet Take 325 mg by mouth daily with breakfast.   Yes [provider]  gabapentin (NEURONTIN) 300 MG capsule Take 1 capsule (300 mg total) by mouth 3 (three) times daily. Start 300mg  at bedtime x1 week then may increase to three times per day. Patient taking differently: Take 300 mg by mouth in the morning, at noon, and at bedtime.  10/21/19  Yes Luetta Nutting, DO  glimepiride (AMARYL) 4 MG tablet Take 4 mg by mouth daily before breakfast.   Yes [provider]  hydrochlorothiazide (HYDRODIURIL) 25 MG tablet Take 25 mg by mouth daily. 02/28/20  Yes [provider]  morphine (MSIR) 15 MG tablet Take 0.5 tablets (7.5 mg total) by mouth every 4 (four) hours as needed for severe pain. 03/01/20  Yes Deno Etienne, DO  predniSONE (DELTASONE) 50 MG tablet Take daily until resolution of gout symptoms. Patient taking differently: Take 50 mg by mouth daily. Take daily until resolution of gout symptoms. 02/21/20  Yes Luetta Nutting, DO  sitaGLIPtin (JANUVIA) 50 MG tablet Take 1 tablet (50 mg total) by mouth daily. 06/26/16  Yes Tat, Shanon Brow, MD  spironolactone (ALDACTONE) 25 MG tablet Take 25 mg by mouth daily. 02/28/20  Yes [provider]  Continuous Blood Gluc Receiver (FREESTYLE LIBRE 14 DAY READER) [place] See admin instructions. 05/23/19   [provider]  Continuous Blood Gluc Sensor (FREESTYLE LIBRE 14 DAY SENSOR) MISC 1 Device by Does not apply route every 14 (fourteen) days. 05/23/19   Luetta Nutting, DO  omeprazole (PRILOSEC) 40 MG capsule Take 1 capsule (40 mg total) by mouth 2 (two) times daily. X 1 month, then once daily Patient not taking: Reported on 03/03/2020 06/26/16   Orson Eva, MD     Family History    Problem Relation Age of Onset  . Cancer Mother        breast  . Multiple sclerosis Daughter   . Cancer Father        prostate  . Cancer Maternal Uncle        bone    Social History   Socioeconomic History  . Marital status: Widowed    Spouse name: Not on file  . Number of children: Not on file  . Years of education: Not on file  . Highest education level: Not on file  Occupational History  . Not on file  Tobacco Use  . Smoking status: Former Smoker    Packs/day: 1.00    Years: 25.00    Pack years: 25.00    Types: Cigarettes    Quit date: 04/11/1984    Years since quitting: 35.9  . Smokeless tobacco: Former Systems developer    Quit date: 04/02/1985  Vaping Use  . Vaping Use: Never used  Substance  and Sexual Activity  . Alcohol use: Yes    Comment: RARE  . Drug use: No  . Sexual activity: Not Currently  Other Topics Concern  . Not on file  Social History Narrative  . Not on file   Social Determinants of Health   Financial Resource Strain:   . Difficulty of Paying Living Expenses: Not on file  Food Insecurity: No Food Insecurity  . Worried About Charity fundraiser in the Last Year: Never true  . Ran Out of Food in the Last Year: Never true  Transportation Needs: No Transportation Needs  . Lack of Transportation (Medical): No  . Lack of Transportation (Non-Medical): No  Physical Activity:   . Days of Exercise per Week: Not on file  . Minutes of Exercise per Session: Not on file  Stress:   . Feeling of Stress : Not on file  Social Connections:   . Frequency of Communication with Friends and Family: Not on file  . Frequency of Social Gatherings with Friends and Family: Not on file  . Attends Religious Services: Not on file  . Active Member of Clubs or Organizations: Not on file  . Attends Archivist Meetings: Not on file  . Marital Status: Not on file      Review of Systems see above; currently denies fever, headache, chest pain, dyspnea, cough, abdominal  pain, nausea or bleeding.  Continues to have left hip pain and difficulty swallowing pills with NG in place  Vital Signs: BP (!) 154/67 (BP Location: Right Arm)   Pulse (!) 57   Temp (!) 97.5 F (36.4 C) (Oral)   Resp (!) 22   Ht 6\' 2"  (1.88 m)   Wt 243 lb 2.7 oz (110.3 kg)   SpO2 99%   BMI 31.22 kg/m   Physical Exam awake, alert.  NG tube in place.  Chest clear to auscultation bilaterally.  Heart with normal rate, irregular rhythm.  Abdomen distended, few bowel sounds, nontender.  No lower extremity edema.  Palpable tenderness left hip region.  Imaging: DG Abd 1 View  Result Date: 03/04/2020 CLINICAL DATA:  NG tube placement EXAM: ABDOMEN - 1 VIEW COMPARISON:  03/03/2020 FINDINGS: An enteric tube is present with tip in the left upper quadrant consistent with location in the upper stomach although the proximal side hole projects over the distal esophagus. Mild gaseous distention of the stomach. Gaseous distention of small bowel is demonstrated in the upper abdomen. IMPRESSION: Enteric tube tip projects over the upper stomach with proximal side hole over the distal esophagus. Electronically Signed   By: Lucienne Capers M.D.   On: 03/04/2020 02:02   DG Abd 1 View  Result Date: 03/03/2020 CLINICAL DATA:  Abdominal distension EXAM: ABDOMEN - 1 VIEW COMPARISON:  02/24/2020 FINDINGS: Diffusely gas distended bowel, including a loop of dilated small bowel in the left mid abdomen. Bilateral nephroureteral stents. IMPRESSION: Diffusely gas distended bowel, including a loop of dilated small bowel in the left mid abdomen. Electronically Signed   By: Ulyses Jarred M.D.   On: 03/03/2020 23:21   CT HIP LEFT WO CONTRAST  Result Date: 03/01/2020 CLINICAL DATA:  Left hip pain. EXAM: CT OF THE LEFT HIP WITHOUT CONTRAST TECHNIQUE: Multidetector CT imaging of the left hip was performed according to the standard protocol. Multiplanar CT image reconstructions were also generated. COMPARISON:  X-ray  03/01/2020 FINDINGS: Bones/Joint/Cartilage Status post left total hip arthroplasty. Arthroplasty components are in their expected alignment. No evidence of periprosthetic fracture.  Large fluid and air containing collection surrounding the proximal femoral component measuring approximately 8.7 cm in AP dimension (series 4, image 61) by 6.7 cm in craniocaudal dimension (series 9, image 94). Largest fluid component is along the lateral aspect of the proximal femur. Air and fluid track medially within the adductor muscle compartment extending to the level of the left pubic tubercle (series 4, images 74-79). Possible erosion along the anterior aspect of the proximal native femur with air seen within bone (series 3, image 68). Periprosthetic lucency along the posterior cortex of the proximal femoral stem extending distally by approximately 7.5 cm (series 8, image 59). Ligaments Suboptimally assessed by CT. Muscles and Tendons Poorly defined fluid and air collection within the left adductor muscular compartment. Soft tissues Remaining soft tissues within normal limits. IMPRESSION: 1. Status post left total hip arthroplasty. Large fluid and air containing collection surrounding the proximal femoral component measuring up to 8.7 cm, concerning for infection/abscess. Fluid and air collection track within the left adductor muscle compartment extending towards the symphysis. Orthopedic consultation and arthrocentesis recommended. 2. Periprosthetic lucency along the posterior cortex of the proximal femoral stem extending distally by approximately 7.5 cm, concerning for septic loosening. 3. Additional area of lucency along the anterior aspect of the proximal native femur with air seen within bone, concerning for osteomyelitis. Electronically Signed   By: Davina Poke D.O.   On: 03/01/2020 11:10   DG Hip Unilat W or Wo Pelvis 2-3 Views Left  Result Date: 03/01/2020 CLINICAL DATA:  Left hip pain radiating down left leg to  knee 1 week. EXAM: DG HIP (WITH OR WITHOUT PELVIS) 2-3V LEFT COMPARISON:  Abdominal films 12/05/2019 FINDINGS: Examination demonstrates bilateral hip arthroplasties intact and unchanged. Minimal air in the soft tissues lateral to the left hip. Known bilateral ureteral stents are present as inferior aspect of the stents come together in the midline of the pelvis extending into the region of the trigone. Moderate degenerate change of the spine. No acute fracture or dislocation. IMPRESSION: 1. Bilateral hip arthroplasties intact and unchanged. No acute fracture or dislocation. Suggestion of mild air over the soft tissues lateral to the left hip as recommend clinical correlation with recent intervention. Soft tissue infection could produce this appearance. 2. Known bilateral ureteral stents as described. Electronically Signed   By: Marin Olp M.D.   On: 03/01/2020 09:21    Labs:  CBC: Recent Labs    01/16/20 0434 01/17/20 0444 03/01/20 0925 03/04/20 0537  WBC 8.1 8.6 10.1 8.2  HGB 9.2* 8.7* 8.6* 9.8*  HCT 27.8* 27.0* 26.3* 30.9*  PLT 422* 402* 414* 596*    COAGS: No results for input(s): INR, APTT in the last 8760 hours.  BMP: Recent Labs    10/24/19 0807 10/24/19 0807 12/06/19 0736 12/06/19 0736 01/06/20 0751 01/06/20 0751 01/13/20 0716 01/14/20 1027 01/16/20 0434 01/16/20 0434 01/17/20 0444 01/21/20 0953 03/01/20 0925 03/04/20 0537  NA 135   < > 137   < > 136   < > 136   < > 138   < > 139 142 133* 135  K 4.8   < > 5.4*   < > 5.3   < > 6.4*   < > 5.6*   < > 4.8 5.1 4.1 4.7  CL 109   < > 109   < > 108   < > 110   < > 113*   < > 114* 113* 106 103  CO2 18*   < > 21   < >  20   < > 19*   < > 17*   < > 17* 21 18* 18*  GLUCOSE 145*   < > 152*   < > 107*   < > 58*   < > 171*   < > 146* 117* 209* 198*  BUN 33*   < > 43*   < > 55*   < > 52*   < > 47*   < > 49* 29* 59* 91*  CALCIUM 9.0   < > 9.0   < > 9.0   < > 8.9   < > 8.9   < > 8.9 8.8 7.9* 8.6*  CREATININE 1.62*   < > 2.02*   <  > 3.11*   < > 3.31*   < > 2.38*   < > 1.92* 1.63* 2.17* 2.20*  GFRNONAA 41*   < > 32*   < > 19*   < > 17*   < > 26*  --  34*  --  31* 31*  GFRAA 48*  --  37*  --  22*  --  20*  --   --   --   --   --   --   --    < > = values in this interval not displayed.    LIVER FUNCTION TESTS: Recent Labs    10/01/19 0803 10/01/19 0803 10/24/19 0807 12/06/19 0736 01/14/20 1027 01/15/20 0448 01/16/20 0434 01/17/20 0444  BILITOT  --   --  0.2 0.3 0.6  --   --   --   AST 12*  --  10 9* 10*  --   --   --   ALT 19  --  14 9 12   --   --   --   ALKPHOS  --   --   --   --  70  --   --   --   PROT  --   --  7.4 7.9 8.9*  --   --   --   ALBUMIN 4.1   < >  --   --  4.0 3.3* 3.6 3.8   < > = values in this interval not displayed.    TUMOR MARKERS: No results for input(s): AFPTM, CEA, CA199, CHROMGRNA in the last 8760 hours.  Assessment and Plan: 74 y.o. male with past medical history of osteoarthritis, anemia,  asthma, coronary artery disease with prior MI, GERD, nephrolithiasis, hypertension, PAF, chronic kidney disease, prostate cancer, diabetes and left total hip arthroplasty in 2001 who was admitted to [facility] 11/23  with persistent left hip pain, occasional chills, hot flashes.  He was seen in the ER at [facility] on 11/21 for left hip pain with CT showing large air/ fluid collection in region. He was also found to have elevated sed rate and CRP.  He was subsequently discharged from the ED, followed up with Ortho and subsequently referred back to [facility] ED for admission .  Imaging also revealed periprosthetic lucency along the posterior cortex of the proximal femoral stem extending distally approximately 7.5 cm concerning for septic loosening.  Also there was additional area of lucency along the anterior aspect of the proximal right femur with air seen within the bone concerning for osteomyelitis.  Request now received from Ortho for ultrasound-guided aspiration and possible drainage of  the left hip air/fluid collection.  WBC normal, hemoglobin 9.8, plts 596k.  Imaging studies were reviewed by Dr. Pascal Lux.  Details/risks of procedure,  including but not limited to, internal bleeding, infection, injury to adjacent structures, need for possible surgery discussed with patient with his understanding and consent.  Procedure is scheduled for today.   Thank you for this interesting consult.  I greatly enjoyed meeting THOMES BURAK and look forward to participating in their care.  A copy of this report was sent to the requesting provider on this date.  Electronically Signed: D. Rowe Robert, PA-C 03/04/2020, 12:12 PM   I spent a total of 30 minutes   in face to face in clinical consultation, greater than 50% of which was counseling/coordinating care for ultrasound-guided aspiration/possible drainage of left hip air/fluid collection

## 2020-03-04 NOTE — Progress Notes (Addendum)
03/03/20 abdomen distended,hypoactive bowel sounds on assessment, as per pt he has been having bloating and gases at home, C/o constipation x 1 week. Informed hospitalist, ordered KUB. 11/24: on NPO p MN,vomited twice clear,thick 200-300 ml, informed hospitalist E. Ouma NP, result of KUB showed * Diffusely gas distended bowel, including a loop of dilated small bowel in the left mid abdomen.Ordered NG tube for decompression,pt educated and consented the procedure, NG tube placed,noted gastric contents on insertion,KUB ordered to verify placement.   0230: KUB result in, hospitalist on call E.Ouma NP ordered to advance NG tube 2cm more and to start LIS, noted 300 ml of output upon starting the decompression.

## 2020-03-05 ENCOUNTER — Inpatient Hospital Stay (HOSPITAL_COMMUNITY): Payer: HMO

## 2020-03-05 DIAGNOSIS — E1122 Type 2 diabetes mellitus with diabetic chronic kidney disease: Secondary | ICD-10-CM

## 2020-03-05 DIAGNOSIS — M00852 Arthritis due to other bacteria, left hip: Secondary | ICD-10-CM

## 2020-03-05 DIAGNOSIS — I129 Hypertensive chronic kidney disease with stage 1 through stage 4 chronic kidney disease, or unspecified chronic kidney disease: Secondary | ICD-10-CM

## 2020-03-05 DIAGNOSIS — R140 Abdominal distension (gaseous): Secondary | ICD-10-CM | POA: Diagnosis not present

## 2020-03-05 DIAGNOSIS — I2510 Atherosclerotic heart disease of native coronary artery without angina pectoris: Secondary | ICD-10-CM

## 2020-03-05 DIAGNOSIS — N183 Chronic kidney disease, stage 3 unspecified: Secondary | ICD-10-CM

## 2020-03-05 LAB — CBC WITH DIFFERENTIAL/PLATELET
Abs Immature Granulocytes: 0 10*3/uL (ref 0.00–0.07)
Band Neutrophils: 9 %
Basophils Absolute: 0 10*3/uL (ref 0.0–0.1)
Basophils Relative: 0 %
Eosinophils Absolute: 0 10*3/uL (ref 0.0–0.5)
Eosinophils Relative: 0 %
HCT: 32.1 % — ABNORMAL LOW (ref 39.0–52.0)
Hemoglobin: 10.3 g/dL — ABNORMAL LOW (ref 13.0–17.0)
Lymphocytes Relative: 7 %
Lymphs Abs: 0.5 10*3/uL — ABNORMAL LOW (ref 0.7–4.0)
MCH: 29.8 pg (ref 26.0–34.0)
MCHC: 32.1 g/dL (ref 30.0–36.0)
MCV: 92.8 fL (ref 80.0–100.0)
Monocytes Absolute: 0.5 10*3/uL (ref 0.1–1.0)
Monocytes Relative: 7 %
Neutro Abs: 6.4 10*3/uL (ref 1.7–7.7)
Neutrophils Relative %: 77 %
Platelets: 601 10*3/uL — ABNORMAL HIGH (ref 150–400)
RBC: 3.46 MIL/uL — ABNORMAL LOW (ref 4.22–5.81)
RDW: 14.6 % (ref 11.5–15.5)
WBC: 7.4 10*3/uL (ref 4.0–10.5)
nRBC: 0 % (ref 0.0–0.2)

## 2020-03-05 LAB — GLUCOSE, CAPILLARY
Glucose-Capillary: 154 mg/dL — ABNORMAL HIGH (ref 70–99)
Glucose-Capillary: 154 mg/dL — ABNORMAL HIGH (ref 70–99)
Glucose-Capillary: 165 mg/dL — ABNORMAL HIGH (ref 70–99)
Glucose-Capillary: 164 mg/dL — ABNORMAL HIGH (ref 70–99)

## 2020-03-05 LAB — URINALYSIS, ROUTINE W REFLEX MICROSCOPIC
Bilirubin Urine: NEGATIVE
Glucose, UA: NEGATIVE mg/dL
Ketones, ur: NEGATIVE mg/dL
Nitrite: NEGATIVE
Protein, ur: 100 mg/dL — AB
Specific Gravity, Urine: 1.016 (ref 1.005–1.030)
pH: 5 (ref 5.0–8.0)

## 2020-03-05 LAB — COMPREHENSIVE METABOLIC PANEL
ALT: 21 U/L (ref 0–44)
AST: 14 U/L — ABNORMAL LOW (ref 15–41)
Albumin: 2.6 g/dL — ABNORMAL LOW (ref 3.5–5.0)
Alkaline Phosphatase: 157 U/L — ABNORMAL HIGH (ref 38–126)
Anion gap: 15 (ref 5–15)
BUN: 117 mg/dL — ABNORMAL HIGH (ref 8–23)
CO2: 18 mmol/L — ABNORMAL LOW (ref 22–32)
Calcium: 8.6 mg/dL — ABNORMAL LOW (ref 8.9–10.3)
Chloride: 105 mmol/L (ref 98–111)
Creatinine, Ser: 2.91 mg/dL — ABNORMAL HIGH (ref 0.61–1.24)
GFR, Estimated: 22 mL/min — ABNORMAL LOW (ref >60)
Glucose, Bld: 186 mg/dL — ABNORMAL HIGH (ref 70–99)
Potassium: 4.6 mmol/L (ref 3.5–5.1)
Sodium: 138 mmol/L (ref 135–145)
Total Bilirubin: 0.8 mg/dL (ref 0.3–1.2)
Total Protein: 8.4 g/dL — ABNORMAL HIGH (ref 6.5–8.1)

## 2020-03-05 LAB — MAGNESIUM: Magnesium: 3.4 mg/dL — ABNORMAL HIGH (ref 1.7–2.4)

## 2020-03-05 LAB — PHOSPHORUS: Phosphorus: 6.5 mg/dL — ABNORMAL HIGH (ref 2.5–4.6)

## 2020-03-05 LAB — C-REACTIVE PROTEIN: CRP: 23.3 mg/dL — ABNORMAL HIGH (ref <1.0)

## 2020-03-05 MED ORDER — MENTHOL 3 MG MT LOZG: 1.0000 | LOZENGE | OROMUCOSAL | Status: DC | PRN

## 2020-03-05 MED ORDER — HEPARIN (PORCINE) 25000 UT/250ML-% IV SOLN
1200.0000 [IU]/h | INTRAVENOUS | Status: DC
  Administered 2020-03-05: 1500 [IU]/h via INTRAVENOUS
  Filled 2020-03-05: qty 250

## 2020-03-05 MED ORDER — CHLORHEXIDINE GLUCONATE CLOTH 2 % EX PADS
6.0000 | MEDICATED_PAD | Freq: Every day | CUTANEOUS | Status: DC
  Administered 2020-03-05 – 2020-03-19 (×15): 6 via TOPICAL

## 2020-03-05 MED ORDER — HYDROMORPHONE HCL 1 MG/ML IJ SOLN
0.5000 mg | INTRAMUSCULAR | Status: DC | PRN
  Administered 2020-03-05 – 2020-03-06 (×5): 0.5 mg via INTRAVENOUS
  Filled 2020-03-05 (×5): qty 0.5

## 2020-03-05 MED ORDER — MAGIC MOUTHWASH
15.0000 mL | Freq: Four times a day (QID) | ORAL | Status: DC | PRN
  Filled 2020-03-05: qty 15

## 2020-03-05 MED ORDER — PIPERACILLIN-TAZOBACTAM 3.375 G IVPB
3.3750 g | Freq: Three times a day (TID) | INTRAVENOUS | Status: DC
  Administered 2020-03-05 – 2020-03-06 (×4): 3.375 g via INTRAVENOUS
  Filled 2020-03-05 (×4): qty 50

## 2020-03-05 MED ORDER — PHENOL 1.4 % MT LIQD
2.0000 | OROMUCOSAL | Status: DC | PRN
  Administered 2020-03-05: 2 via OROMUCOSAL
  Filled 2020-03-05: qty 177

## 2020-03-05 MED ORDER — ALUM & MAG HYDROXIDE-SIMETH 200-200-20 MG/5ML PO SUSP
30.0000 mL | Freq: Four times a day (QID) | ORAL | Status: DC | PRN
  Administered 2020-03-15 – 2020-03-20 (×3): 30 mL via ORAL
  Filled 2020-03-05 (×3): qty 30

## 2020-03-05 MED ORDER — METOPROLOL TARTRATE 5 MG/5ML IV SOLN: 5.0000 mg | Freq: Four times a day (QID) | INTRAVENOUS | Status: DC | PRN

## 2020-03-05 MED ORDER — DILTIAZEM HCL-DEXTROSE 125-5 MG/125ML-% IV SOLN (PREMIX)
5.0000 mg/h | INTRAVENOUS | Status: DC
  Administered 2020-03-05: 5 mg/h via INTRAVENOUS
  Administered 2020-03-06 – 2020-03-11 (×11): 15 mg/h via INTRAVENOUS
  Filled 2020-03-05 (×14): qty 125

## 2020-03-05 MED ORDER — BISACODYL 10 MG RE SUPP
10.0000 mg | Freq: Every day | RECTAL | Status: DC
  Administered 2020-03-06 – 2020-03-11 (×5): 10 mg via RECTAL
  Filled 2020-03-05 (×6): qty 1

## 2020-03-05 MED ORDER — LIP MEDEX EX OINT
1.0000 "application " | TOPICAL_OINTMENT | Freq: Two times a day (BID) | CUTANEOUS | Status: DC
  Administered 2020-03-05 – 2020-03-20 (×32): 1 via TOPICAL
  Filled 2020-03-05 (×4): qty 7

## 2020-03-05 NOTE — Progress Notes (Signed)
PHARMACY NOTE -  [facility] has been assisting with dosing of Zosyn for hip prosthetic joint infection/osteo with possible GI source; GNR in thigh abscess.  Initiate at 3.375 g IV q8 hr and will follow peripherally for increasing SCr  ID to see  Pharmacy will sign off, following peripherally for culture results or dose adjustments. Please reconsult if a change in clinical status warrants re-evaluation of dosage.  Reuel Boom, PharmD, BCPS [phone] 03/05/2020, 9:00 AM

## 2020-03-05 NOTE — Progress Notes (Addendum)
PROGRESS NOTE    TACOMA MERIDA  QQP:[id number] DOB: [birth year] DOA: 03/03/2020 PCP: Luetta Nutting, DO   No chief complaint on file.  Brief Narrative:  [name] is [name] 74 y.o. male with medical history significant of coronary artery disease, diabetes type 2, hypertension, anemia, CKD stage IIIb, osteoarthritis status post left hip arthroplasty, renal stone/hydronephrosis status post stents, prostate cancer ,paroxysmal atrial flutter not on anticoagulation who was sent for direct admission to [facility] from [facility] orth[facility] office,Dr Aluisio.  Patient was seen in the emergency department on 03/01/2020 for left hip pain.  There was no report of injury.  He had CT left hip done which showed  fluid collection.  He was found to have elevated ESR and CRP.  He was discharged from the emergency department to follow-up with orthopedics as per orthopedics recommendation.   In the orthopedics office, he reported chills, nausea/diarrhea and increased left hip pain.  He was hardly able to walk.  He was afebrile.  Patient was requested for direct admission by orthopedics team for drainage or intervention of the left hip fluid collection.  Lab work also showed elevated ESR and CRP. Patient seen and examined at the bedside this evening.  During my evaluation, he was hemodynamically stable and overall comfortable.  He was hypertensive.  He complained of severe pain on his left hip and the left ear was found to be tender.  He denied any fever, chest pain, shortness of breath, cough, abdomen pain, nausea, vomiting, dysuria, hematochezia or melena.  He ives alone.  Assessment & Plan:   Active Problems:   OA (osteoarthritis) of hip   Type 2 diabetes mellitus with hypoglycemia without coma (HCC)   Essential hypertension   Hyperlipidemia with target LDL less than 70   Hip pain   Effusion of hip joint, left  Left hip collection  Pain  Concern for infected hardware: CT left hip showed large  fluid and air containing collection surroudning the proximal femoral component measuring up to 8.7 cm, concerning for infection/abscess.  Fluid and air collection track within the L adductor muscle compartment extending towards the symphysis.  Periprosthetic lucency along the posterior cortex of the proximal femoral stem extending distally by approximately 7.5 cm, concerning for septic loosening.  Area of lucency along the anterior aspect of the proximal native femur with air seen within bone, concerning for osteo. Orthopedic c/s, appreciate recs S/p CT guided placed 10 F drainage catheter placement  Culture with moderate gram negative rods  Ortho planning for surgery on Monday Zosyn 11/25 - present  Partial SBO: s/p NG tube placement for diffusely gas distended bowel Follow CT abdomen pelvis -> tiny richter hernia containing Griff Badley very short loop of small bowel with an associated early/partial SBO. Surgery c/s, appreciate recs Continue NGT to LIS NPO for now  Paroxysmal Wynne Jury. fib/atrial flutter: Follows with Dr [name].  Not on any anticoagulation at home because of anemia.  Was on anticoagulation in the past - will look into this, consider starting anticoagulation - discussed with pt.  On Coreg for rate control (on hold while NPO).  IV metoprolol q6.  Some intermittent RVR chadvasc at leat 3. Discussed with pt, will start heparin, will c/s Dr. [name] tomorrow. Transition to dilt gtt, use metop prn Follow repeat echo  Diabetes type 2:Hemoglobin A1c of 7.1 as per 9/21. SSI - hold home amaryl and januvia Adjust prn  AKI on CKD stage IIIb: His kidney function has fluctuated over several months from  1.5-3.  Continue to monitor kidney function.  Avoid nephrotoxins.  He needs to follow-up with nephrology as an outpatient. IVF  History of severe left hydroureteronephrosis/prostate cancer: Follows with urology.  He is status post bilateral ureteral stent placement by Dr. [name] 10/6.  Currently  prostate cancer is in remission.  Normocytic anemia: Most likely associated  with chronic kidney disease, chronic medical problems.  Hemoglobin currently stable .  No report of hematochezia or melena.  Coronary artery disease: Takes aspirin.  Currently held for tomorrow's procedure. Denies any chest pain.    Hypertension:  Oral meds on hold with above.  Prn labetalol.  Hyperlipidemia: Takes Lipitor.  GERD: Continue Protonix  Left hip pain/debility: We will request for PT/OT evaluation after procedure.  DVT prophylaxis: heparin Code Status: full  Family Communication: none at bedside Disposition:   Status is: Observation  The patient will require care spanning > 2 midnights and should be moved to inpatient because: Inpatient level of care appropriate due to severity of illness  Dispo: The patient is from: Home              Anticipated d/c is to: pending              Anticipated d/c date is: > 3 days              Patient currently is not medically stable to d/c.   Consultants:   IR  Ortho  Procedures: IR IMPRESSION: Successful CT guided placement of Vuk Skillern 12 French all purpose drain catheter into the complex fluid collection involving the proximal lateral aspect the left thigh with aspiration of 50 cc of purulent fluid. Samples were sent to the laboratory as requested by the ordering clinical team.  Antimicrobials: Anti-infectives (From admission, onward)   Start     Dose/Rate Route Frequency Ordered Stop   03/05/20 1000  piperacillin-tazobactam (ZOSYN) IVPB 3.375 g        3.375 g 12.5 mL/hr over 240 Minutes Intravenous Every 8 hours 03/05/20 0904        Subjective: No new complaints Asking for something to drink  Objective: Vitals:   03/04/20 2023 03/05/20 0031 03/05/20 0554 03/05/20 1515  BP: (!) 155/83 (!) 156/74 (!) 143/82 (!) 151/82  Pulse: 98 (!) 108 (!) 110 77  Resp: '18 20 20 20  ' Temp: 98.1 F (36.7 C) 97.8 F (36.6 C) 97.9 F (36.6 C) 97.8 F  (36.6 C)  TempSrc: Oral Oral Oral Oral  SpO2: 98% 97% 98% 97%  Weight:      Height:        Intake/Output Summary (Last 24 hours) at 03/05/2020 1537 Last data filed at 03/05/2020 1521 Gross per 24 hour  Intake 504.6 ml  Output 2425 ml  Net -1920.4 ml   Filed Weights   03/03/20 1855  Weight: 110.3 kg    Examination:  General: No acute distress. Cardiovascular: irregularly irregular Lungs: Clear to auscultation bilaterally  Abdomen: Soft, nontender, distended - NG in place Neurological: Alert and oriented 3. Moves all extremities 4. Cranial nerves II through XII grossly intact. Skin: Warm and dry. No rashes or lesions. Extremities: No clubbing or cyanosis. No edema.   Data Reviewed: I have personally reviewed following labs and imaging studies  CBC: Recent Labs  Lab 03/01/20 0925 03/04/20 0537 03/05/20 0428  WBC 10.1 8.2 7.4  NEUTROABS 8.5*  --  6.4  HGB 8.6* 9.8* 10.3*  HCT 26.3* 30.9* 32.1*  MCV 92.9 92.8 92.8  PLT 414* 596*  601*    Basic Metabolic Panel: Recent Labs  Lab 03/01/20 0925 03/04/20 0537 03/05/20 0428  NA 133* 135 138  K 4.1 4.7 4.6  CL 106 103 105  CO2 18* 18* 18*  GLUCOSE 209* 198* 186*  BUN 59* 91* 117*  CREATININE 2.17* 2.20* 2.91*  CALCIUM 7.9* 8.6* 8.6*  MG  --   --  3.4*  PHOS  --   --  6.5*    GFR: Estimated Creatinine Clearance: 29.4 mL/min (Anavey Coombes) (by C-G formula based on SCr of 2.91 mg/dL (H)).  Liver Function Tests: Recent Labs  Lab 03/05/20 0428  AST 14*  ALT 21  ALKPHOS 157*  BILITOT 0.8  PROT 8.4*  ALBUMIN 2.6*    CBG: Recent Labs  Lab 03/04/20 1139 03/04/20 1603 03/04/20 2210 03/05/20 0751 03/05/20 1116  GLUCAP 213* 172* 144* 154* 154*     Recent Results (from the past 240 hour(s))  Resp Panel by RT-PCR (Flu Mily Malecki&B, Covid) Nasopharyngeal Swab     Status: None   Collection Time: 03/01/20 12:20 PM   Specimen: Nasopharyngeal Swab; Nasopharyngeal(NP) swabs in vial transport medium  Result Value Ref Range  Status   SARS Coronavirus 2 by RT PCR NEGATIVE NEGATIVE Final    Comment: (NOTE) SARS-CoV-2 target nucleic acids are NOT DETECTED.  The SARS-CoV-2 RNA is generally detectable in upper respiratory specimens during the acute phase of infection. The lowest concentration of SARS-CoV-2 viral copies this assay can detect is 138 copies/mL. Jansen Goodpasture negative result does not preclude SARS-Cov-2 infection and should not be used as the sole basis for treatment or other patient management decisions. Irineo Gaulin negative result may occur with  improper specimen collection/handling, submission of specimen other than nasopharyngeal swab, presence of viral mutation(s) within the areas targeted by this assay, and inadequate number of viral copies(<138 copies/mL). Kyrillos Adams negative result must be combined with clinical observations, patient history, and epidemiological information. The expected result is Negative.  Fact Sheet for Patients:  EntrepreneurPulse.com.au  Fact Sheet for Healthcare Providers:  IncredibleEmployment.be  This test is no t yet approved or cleared by the [place] FDA and  has been authorized for detection and/or diagnosis of SARS-CoV-2 by FDA under an Emergency Use Authorization (EUA). This EUA will remain  in effect (meaning this test can be used) for the duration of the COVID-19 declaration under Section 564(b)(1) of the Act, 21 U.S.C.section 360bbb-3(b)(1), unless the authorization is terminated  or revoked sooner.       Influenza Rakeisha Nyce by PCR NEGATIVE NEGATIVE Final   Influenza B by PCR NEGATIVE NEGATIVE Final    Comment: (NOTE) The Xpert Xpress SARS-CoV-2/FLU/RSV plus assay is intended as an aid in the diagnosis of influenza from Nasopharyngeal swab specimens and should not be used as Fransheska Willingham sole basis for treatment. Nasal washings and aspirates are unacceptable for Xpert Xpress SARS-CoV-2/FLU/RSV testing.  Fact Sheet for  Patients: EntrepreneurPulse.com.au  Fact Sheet for Healthcare Providers: IncredibleEmployment.be  This test is not yet approved or cleared by the [place] FDA and has been authorized for detection and/or diagnosis of SARS-CoV-2 by FDA under an Emergency Use Authorization (EUA). This EUA will remain in effect (meaning this test can be used) for the duration of the COVID-19 declaration under Section 564(b)(1) of the Act, 21 U.S.C. section 360bbb-3(b)(1), unless the authorization is terminated or revoked.  Performed at [facility], [place] [address], [place], [place] [address]   Culture, blood (routine x 2)     Status: None (Preliminary result)  Collection Time: 03/03/20  7:04 PM   Specimen: BLOOD LEFT HAND  Result Value Ref Range Status   Specimen Description   Final    BLOOD LEFT HAND Performed at [place] [address] [place], [place] [address]    Special Requests   Final    BOTTLES DRAWN AEROBIC ONLY Blood Culture adequate volume Performed at [facility] [address], [place], [place] [address]    Culture   Final    NO GROWTH 2 DAYS Performed at [facility] [address], [place], [place] [address]    Report Status PENDING  Incomplete  Culture, blood (routine x 2)     Status: None (Preliminary result)   Collection Time: 03/03/20  7:06 PM   Specimen: BLOOD RIGHT HAND  Result Value Ref Range Status   Specimen Description   Final    BLOOD RIGHT HAND Performed at [facility] [address] [place], [place] [address]    Special Requests   Final    BOTTLES DRAWN AEROBIC ONLY Blood Culture adequate volume Performed at [facility] [address], [place], [place] [address]    Culture   Final    NO GROWTH 2 DAYS Performed at [facility] [address], [place], [place] [address]    Report Status PENDING   Incomplete  Aerobic/Anaerobic Culture (surgical/deep wound)     Status: None (Preliminary result)   Collection Time: 03/04/20  3:38 PM   Specimen: Abscess  Result Value Ref Range Status   Specimen Description   Final    ABSCESS DRAIN LEFT LATERAL THIGH Performed at [facility] [address], [place], [place] [address]    Special Requests   Final    NONE Performed at [facility], [place] [address] [place], [place] [address]    Gram Stain   Final    ABUNDANT WBC PRESENT,BOTH PMN AND MONONUCLEAR ABUNDANT GRAM NEGATIVE RODS    Culture   Final    MODERATE GRAM NEGATIVE RODS SUSCEPTIBILITIES TO FOLLOW Performed at [facility], [place] [address], [place], [place] [address]    Report Status PENDING  Incomplete         Radiology Studies: CT ABDOMEN PELVIS WO CONTRAST  Result Date: 03/04/2020 CLINICAL DATA:  Nausea vomiting. EXAM: CT ABDOMEN AND PELVIS WITHOUT CONTRAST TECHNIQUE: Multidetector CT imaging of the abdomen and pelvis was performed following the standard protocol without IV contrast. COMPARISON:  CT abdomen pelvis 01/14/2020 FINDINGS: Lines and tubes: Enteric tube noted with tip within the gastric lumen and side port at the gastroesophageal junction. Bilateral ureteral stents with proximal pigtails in Chrislynn Mosely superior pole calyx and distal pigtails not visualized due to streak artifact originating from bilateral femoral surgical hardware. Distal pigtails of the ureteral stents terminate in the expected region of the urinary bladder. Lower chest: Coronary artery calcifications. Trace pericardial effusion. No acute abnormality. Hepatobiliary: No focal liver abnormality. No gallstones, gallbladder wall thickening, or pericholecystic fluid. No biliary dilatation. Pancreas: No focal lesion. Normal pancreatic contour. No surrounding inflammatory changes. No main pancreatic ductal dilatation. Spleen: Normal in size. There is Navpreet Szczygiel 1.6 cm fluid density  lesion within the spleen likely represents Micaella Gitto simple cyst. Otherwise no focal abnormality. Adrenals/Urinary Tract: No adrenal nodule bilaterally. Couple of tiny foci of gas noted within the collecting systems bilaterally. No nephrolithiasis, no hydronephrosis, and no contour-deforming renal mass. No ureterolithiasis or hydroureter. The urinary bladder  is unremarkable. Stomach/Bowel: Stomach is within normal limits. The proximal mid small bowel is mildly distended with fluid of to 3.2 cm in caliber. No associated pneumatosis. There is suggestion of Skii Cleland possible transition point within the anterior abdomen at the level of the umbilicus. The distal small bowel is collapsed. No evidence of large bowel wall thickening or dilatation. Appendix appears normal. Vascular/Lymphatic: No abdominal aorta or iliac aneurysm. Severe atherosclerotic plaque of the aorta and its branches. No abdominal, pelvic, or inguinal lymphadenopathy. Reproductive: Not visualized due to streak artifact originating from bilateral its femoral surgical hardware. Other: No intraperitoneal free fluid. No intraperitoneal free gas. No organized fluid collection. Musculoskeletal: Tiny Richter hernia containing the anti mesenteric wall of Vuk Skillern very short segment of small bowel (where the possible transition point is identified). No suspicious lytic or blastic osseous lesions. No acute displaced fracture. Multilevel degenerative changes of the spine. Partially visualized bilateral total hip arthroplasties. IMPRESSION: 1. Tiny Richter hernia containing Jeremaih Klima very short loop of small bowel with an associated early/partial small bowel obstruction. 2. Couple of tiny foci of gas noted within the collecting systems bilaterally. Findings could be related to recent ureteral stent placement versus could represent emphysematous pyelitis. Recommend correlation with urinalysis for urinary tract infection. 3. Enteric tube with tip within the gastric lumen and side port at the  gastroesophageal junction. Consider advancing by 3-5cm. 4.  Aortic Atherosclerosis (ICD10-I70.0). Electronically Signed   By: Iven Finn M.D.   On: 03/04/2020 21:09   DG Abd 1 View  Result Date: 03/04/2020 CLINICAL DATA:  NG tube placement EXAM: ABDOMEN - 1 VIEW COMPARISON:  03/04/2020 FINDINGS: Esophageal tube tip projects over the mid gastric region. Dilated loops of small bowel suspicious for obstruction. Partially visualized ureteral stents IMPRESSION: Esophageal tube tip overlies the mid stomach. Electronically Signed   By: Donavan Foil M.D.   On: 03/04/2020 22:46   DG Abd 1 View  Result Date: 03/04/2020 CLINICAL DATA:  NG tube placement EXAM: ABDOMEN - 1 VIEW COMPARISON:  03/03/2020 FINDINGS: An enteric tube is present with tip in the left upper quadrant consistent with location in the upper stomach although the proximal side hole projects over the distal esophagus. Mild gaseous distention of the stomach. Gaseous distention of small bowel is demonstrated in the upper abdomen. IMPRESSION: Enteric tube tip projects over the upper stomach with proximal side hole over the distal esophagus. Electronically Signed   By: Lucienne Capers M.D.   On: 03/04/2020 02:02   DG Abd 1 View  Result Date: 03/03/2020 CLINICAL DATA:  Abdominal distension EXAM: ABDOMEN - 1 VIEW COMPARISON:  02/24/2020 FINDINGS: Diffusely gas distended bowel, including Shakedra Beam loop of dilated small bowel in the left mid abdomen. Bilateral nephroureteral stents. IMPRESSION: Diffusely gas distended bowel, including Edmonia Gonser loop of dilated small bowel in the left mid abdomen. Electronically Signed   By: Ulyses Jarred M.D.   On: 03/03/2020 23:21   DG Abd Portable 1V-Small Bowel Obstruction Protocol-initial, 8 hr delay  Result Date: 03/05/2020 CLINICAL DATA:  SBO EXAM: PORTABLE ABDOMEN - 1 VIEW COMPARISON:  March 04, 2020 FINDINGS: Persistent gaseous dilation of loops of small bowel. Small bowel loop measures up to 5.2 cm, similar in  comparison to prior. There is enteric contrast within Shahab Polhamus dilated loop of proximal small bowel. Enteric tube is coiled within the stomach with tip terminating over the distal stomach. Bilateral double-J stents. LEFT-sided surgical drain overlying the LEFT lateral leg. Soft tissue air of the LEFT lateral leg. Status post bilateral  hip arthroplasty. Degenerative changes of the lower lumbar spine. IMPRESSION: Persistent gaseous dilation of loops of small bowel, similar in comparison to prior. Enteric contrast is within the dilated proximal small bowel. Electronically Signed   By: Valentino Saxon MD   On: 03/05/2020 11:12   [place] FNA SOFT TISSUE  Result Date: 03/04/2020 INDICATION: History of left total hip replacement, now with concern for infection. Please perform ultrasound-guided aspiration and/or drainage catheter placement for infection source control purposes. EXAM: ULTRASOUND-GUIDED DRAINAGE CATHETER PLACEMENT INTO DOMINANT FLUID COLLECTION INVOLVING THE PROXIMAL LATERAL ASPECT THE LEFT THIGH COMPARISON:  Left hip CT-03/01/2020 MEDICATIONS: The patient is currently admitted to the hospital and receiving intravenous antibiotics. The antibiotics were administered within an appropriate time frame prior to the initiation of the procedure. ANESTHESIA/SEDATION: Moderate (conscious) sedation was employed during this procedure. Anya Murphey total of Versed 2 mg and Fentanyl 100 mcg was administered intravenously. Moderate Sedation Time: 18 minutes. The patient's level of consciousness and vital signs were monitored continuously by radiology nursing throughout the procedure under my direct supervision. CONTRAST:  None COMPLICATIONS: None immediate. PROCEDURE: Informed written consent was obtained from the patient after Elyce Zollinger discussion of the risks, benefits and alternatives to treatment. The patient was placed supine on his hospital bed with ultrasound imaging demonstrating Zemira Zehring large (at least 7.6 x 1.6 cm complex fluid collection  with the subcutaneous tissues involving the lateral aspect of the proximal left thigh (representative image 2). The procedure was planned. Erskine Steinfeldt timeout was performed prior to the initiation of the procedure. The skin overlying the lateral aspect the left thigh was prepped and draped in the usual sterile fashion. The overlying soft tissues were anesthetized with 1% lidocaine with epinephrine. Utilizing Anija Brickner caudal to cranial approach, an 18 gauge trocar needle was advanced through near the entirety of the abscess/fluid collection within the left lateral thigh and Duke Weisensel short Amplatz super stiff wire was coiled within the cranial component of the collection. Appropriate positioning was confirmed ultrasound imaging. The tract was serially dilated allowing placement of Joella Saefong 12 [place] all-purpose drainage catheter. Appropriate positioning was confirmed with Drexler Maland limited postprocedural CT scan. Next, approximately 50 cc of purulent fluid was aspirated. The tube was connected to Cortasia Screws JP bulb and sutured in place. Jayne Peckenpaugh dressing was placed. The patient tolerated the procedure well without immediate post procedural complication. IMPRESSION: Successful CT guided placement of Khaleed Holan 75 French all purpose drain catheter into the complex fluid collection involving the proximal lateral aspect the left thigh with aspiration of 50 cc of purulent fluid. Samples were sent to the laboratory as requested by the ordering clinical team. Electronically Signed   By: Sandi Mariscal M.D.   On: 03/04/2020 15:43        Scheduled Meds: . amLODipine  10 mg Oral Daily  . atorvastatin  40 mg Oral Daily  . bisacodyl  10 mg Rectal Daily  . Chlorhexidine Gluconate Cloth  6 each Topical Daily  . diclofenac Sodium  4 g Topical QID  . ferrous sulfate  325 mg Oral Q breakfast  . gabapentin  300 mg Oral TID  . heparin  5,000 Units Subcutaneous Q8H  . insulin aspart  0-15 Units Subcutaneous TID WC  . insulin aspart  0-5 Units Subcutaneous QHS  . lip balm  1 application  Topical BID  . metoprolol tartrate  5 mg Intravenous Q6H  . pantoprazole  40 mg Oral Daily   Continuous Infusions: . lactated ringers 75 mL/hr at 03/05/20 0854  . piperacillin-tazobactam (ZOSYN)  IV 3.375 g (03/05/20 1043)     LOS: 2 days    Time spent: over 30 min    Fayrene Helper, MD [facility]   To contact the attending provider between 7A-7P or the covering provider during after hours 7P-7A, please log into the web site [email or web address] and access using universal [place] password for that web site. If you do not have the password, please call the hospital operator.  03/05/2020, 3:37 PM

## 2020-03-05 NOTE — Progress Notes (Signed)
Subjective: [name] says his hip feels a little better this AM after the drainage yesterday.    Objective: Vital signs in last 24 hours: Temp:  [97.5 F (36.4 C)-98.1 F (36.7 C)] 97.9 F (36.6 C) (11/25 0554) Pulse Rate:  [57-117] 110 (11/25 0554) Resp:  [17-22] 20 (11/25 0554) BP: (136-156)/(67-83) 143/82 (11/25 0554) SpO2:  [97 %-100 %] 98 % (11/25 0554)  Intake/Output from previous day: 11/24 0701 - 11/25 0700 In: 444.6 [I.V.:384.6; NG/GT:60] Out: 2500 [Urine:900; Emesis/NG output:1600] Intake/Output this shift: No intake/output data recorded.  Recent Labs    03/04/20 0537 03/05/20 0428  HGB 9.8* 10.3*   Recent Labs    03/04/20 0537 03/05/20 0428  WBC 8.2 7.4  RBC 3.33* 3.46*  HCT 30.9* 32.1*  PLT 596* 601*   Recent Labs    03/04/20 0537 03/05/20 0428  NA 135 138  K 4.7 4.6  CL 103 105  CO2 18* 18*  BUN 91* 117*  CREATININE 2.20* 2.91*  GLUCOSE 198* 186*  CALCIUM 8.6* 8.6*   Recent Labs    03/04/20 1252  INR 1.1    Neurologically intact No cellulitis present Compartment soft Drain in place left hip. No significant swelling. No warmth or erythema   Gram stain left hip fluid with gram negative rods  Assessment/Plan: Left hip pain- He has septic arthritis of the hip. Early results show gram negative rods so this  possibly represents seeding from his urinary tract (He currently has stents in place). Regardless of etiology this represents a difficult situation. He will need an irrigation and debridement but currently has an ileus and worsening renal function which need improvement prior to taking him to surgery. Decompressing the area with the IR drainage procedure and indwelling drain will help temporarily and should prevent sepsis but he will need further intervention if/when medically capable. The most predictable means of resolving the infection would be a resection arthroplasty with 2 stage revision but I do not think he could tolerate this from a  medical standpoint. Given the acuity of the infection I will attempt a debridement and implant retention followed by intravenous antibiotics for 6 weeks plus 6 months to lifelong suppressive antibiotics. If successful he will be able to retain his prosthesis. If not, then he may require a 2 stage revision, which he may not be able to tolerate from a medical standpoint. Plan for now should be to start IV antibiotics under direction of infectious disease based on gram stain/culture results and allow a few days for his acute medical issues to improve/resolve prior to surgery for the hip. I will tentatively plan on doing an irrigation and debridement on Monday 11/29 if he is medically capable of undergoing surgery. My partners will see him over the weekend.      Pilar Plate Krishang Reading 03/05/2020, 8:44 AM

## 2020-03-05 NOTE — Progress Notes (Signed)
[facility] for heparin Indication: atrial fibrillation  Allergies  Allergen Reactions  . Shellfish Allergy Rash    Patient Measurements: Height: 6\' 2"  (188 cm) Weight: 110.3 kg (243 lb 2.7 oz) IBW/kg (Calculated) : 82.2 Heparin Dosing Weight: 105 kg  Vital Signs: Temp: 97.8 F (36.6 C) (11/25 1515) Temp Source: Oral (11/25 1515) BP: 151/82 (11/25 1515) Pulse Rate: 77 (11/25 1515)  Labs: Recent Labs    03/04/20 0537 03/04/20 1252 03/05/20 0428  HGB 9.8*  --  10.3*  HCT 30.9*  --  32.1*  PLT 596*  --  601*  LABPROT  --  13.9  --   INR  --  1.1  --   CREATININE 2.20*  --  2.91*    Estimated Creatinine Clearance: 29.4 mL/min (A) (by C-G formula based on SCr of 2.91 mg/dL (H)).   Assessment: Patient is a 74 y.o M with hx afib (not on anticoag. PTA) presented to the ED from [facility] for management of septic left hip with plan for I&D on 03/09/20.  He was found to be in afib. Pharmacy is consulted to start heparin for afib.  - 11/24: IR placement of drainage cath into hip abscess  Goal of Therapy:  Heparin level 0.3-0.7 units/ml Monitor platelets by anticoagulation protocol: Yes   Plan:  - d/c heparin SQ - heparin drip at 1500 units/hr (no bolus d/t recent IR procedure and recent adm of heparin SQ dose) - check 8 hr heparin level - monitor for s/sx bleeding  [name] 03/05/2020,6:33 PM

## 2020-03-05 NOTE — Consult Note (Signed)
[facility] for Infectious Disease    Date of Admission:  03/03/2020      Current antibiotics: Day 1 Pip Tazo   Reason for Consult: Septic arthritis    Referring Physician: Dr Florene Glen  ASSESSMENT AND PLAN:    [name] is a 74 y.o. male with PMHx of CAD, DM2, HTN, CKD, OA, s/p left hip arthoplasty (2001), prostate cancer, PAF, left ureteral stone s/p stenting (01/2020) who was sent from ortho clinic on 11/23 by Dr Wynelle Link.  Found to have left hip abscess s/p drainage and planning for DAIR with orthopedics surgery on Monday.  # Left hip septic arthritis/prosthetic joint infection, abscess: this likely represents a urinary source in light of GNR in culture and recent urologic procedure in October 2021 # History of left hip arthroplasty # ? Pyelitis on CT scan  # CKD  -- follow up drainage cultures -- okay to continue Pip tazo for now renally dosed -- will follow   HPI:    [name] is a 74 y.o. male with PMHx of CAD, DM2, HTN, CKD, OA, s/p left hip arthoplasty (2001), prostate cancer, PAF, left ureteral stone s/p stenting (01/2020) who was sent from ortho clinic on 11/23 by Dr Wynelle Link.  Patient was seen in the ED 11/21 for hip pain x 1 week.  CT scan obtained at that time showed a large fluid collection around the femoral component with a fluid air level. There was potential loseening of the femoral component and questionable osteomyelitis and abscess. ESR/CRP elevated at >140/ 21.3 respectively. WBC normal.  He was discharged to follow up with ortho.  While in the clinic he reported chills, n/v/d, and increased left hip pain.  He had no fevers.  Sent to the hospital for surgical intervention.  IR placed a 10 Fr drainage catheter into the abscess involving the lateral aspect of the left thight yielding 50 cc of purulent appearing fluid on 11/24.  Cultures of this have grown GNRs  He was also found to have pSBO vs ileus.  Seen by general surgery and followed on SB  protocol.   Orthopedics is planning on DAIR procedure Monday pending optimization of other medical issues.      Past Medical History:  Diagnosis Date  . Arthritis   . Asthma    no inhaler  . Coronary artery disease    cardiologist-  dr spruill; last visit 3 mos ago per pt  . GERD (gastroesophageal reflux disease)   . History of MI (myocardial infarction)    1985  . Hydronephrosis, left   . Hypertension   . Myocardial infarction ([place])   . Nephrolithiasis    left  . 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 Presence of tooth-root and mandibular implants    lower dental implants  . Prostate cancer ([place])   . Shortness of breath    WITH EXERTION  . Type 2 diabetes mellitus (HCC)     Social History   Tobacco Use  . Smoking status: Former Smoker    Packs/day: 1.00    Years: 25.00    Pack years: 25.00    Types: Cigarettes    Quit date: 04/11/1984    Years since quitting: 35.9  . Smokeless tobacco: Former Systems developer    Quit date: 04/02/1985  Vaping Use  . Vaping Use: Never used  Substance Use Topics  . Alcohol use: Yes    Comment: RARE  . Drug use: No  Family History  Problem Relation Age of Onset  . Cancer Mother        breast  . Multiple sclerosis Daughter   . Cancer Father        prostate  . Cancer Maternal Uncle        bone    Allergies  Allergen Reactions  . Shellfish Allergy Rash    Review of Systems  Constitutional: Negative for chills and fever.  HENT: Negative.   Respiratory: Negative.   Cardiovascular: Negative.   Gastrointestinal: Positive for abdominal pain, nausea and vomiting.  Genitourinary: Negative.   Musculoskeletal: Positive for joint pain.  Skin: Negative.   Neurological: Negative.   Psychiatric/Behavioral: Negative.     All other systems reviewed and are negative.  OBJECTIVE:   Blood pressure (!) 143/82, pulse (!) 110, temperature 97.9 F (36.6 C), temperature source Oral, resp. rate 20, height '6\' 2"'  (1.88 m),  weight 110.3 kg, SpO2 98 %. Body mass index is 31.22 kg/m.  Physical Exam Constitutional:      General: He is not in acute distress.    Appearance: Normal appearance.  Cardiovascular:     Rate and Rhythm: Normal rate and regular rhythm.  Pulmonary:     Effort: Pulmonary effort is normal. No respiratory distress.     Breath sounds: Normal breath sounds.  Abdominal:     General: There is distension.     Palpations: Abdomen is soft.     Tenderness: There is abdominal tenderness. There is no guarding or rebound.  Musculoskeletal:     Comments: Drain in place left hip.  Neurological:     General: No focal deficit present.     Mental Status: He is alert and oriented to person, place, and time.  Psychiatric:        Mood and Affect: Mood normal.        Behavior: Behavior normal.      Lab Results & Microbiology Lab Results  Component Value Date   WBC 7.4 03/05/2020   HGB 10.3 (L) 03/05/2020   HCT 32.1 (L) 03/05/2020   MCV 92.8 03/05/2020   PLT 601 (H) 03/05/2020    Lab Results  Component Value Date   NA 138 03/05/2020   K 4.6 03/05/2020   CO2 18 (L) 03/05/2020   GLUCOSE 186 (H) 03/05/2020   BUN 117 (H) 03/05/2020   CREATININE 2.91 (H) 03/05/2020   CALCIUM 8.6 (L) 03/05/2020   GFRNONAA 22 (L) 03/05/2020   GFRAA 20 (L) 01/13/2020    Lab Results  Component Value Date   ALT 21 03/05/2020   AST 14 (L) 03/05/2020   ALKPHOS 157 (H) 03/05/2020   BILITOT 0.8 03/05/2020     I have reviewed the micro and lab results in Epic.  Imaging CT ABDOMEN PELVIS WO CONTRAST  Result Date: 03/04/2020 CLINICAL DATA:  Nausea vomiting. EXAM: CT ABDOMEN AND PELVIS WITHOUT CONTRAST TECHNIQUE: Multidetector CT imaging of the abdomen and pelvis was performed following the standard protocol without IV contrast. COMPARISON:  CT abdomen pelvis 01/14/2020 FINDINGS: Lines and tubes: Enteric tube noted with tip within the gastric lumen and side port at the gastroesophageal junction. Bilateral  ureteral stents with proximal pigtails in a superior pole calyx and distal pigtails not visualized due to streak artifact originating from bilateral femoral surgical hardware. Distal pigtails of the ureteral stents terminate in the expected region of the urinary bladder. Lower chest: Coronary artery calcifications. Trace pericardial effusion. No acute abnormality. Hepatobiliary: No focal liver abnormality. No gallstones,  gallbladder wall thickening, or pericholecystic fluid. No biliary dilatation. Pancreas: No focal lesion. Normal pancreatic contour. No surrounding inflammatory changes. No main pancreatic ductal dilatation. Spleen: Normal in size. There is a 1.6 cm fluid density lesion within the spleen likely represents a simple cyst. Otherwise no focal abnormality. Adrenals/Urinary Tract: No adrenal nodule bilaterally. Couple of tiny foci of gas noted within the collecting systems bilaterally. No nephrolithiasis, no hydronephrosis, and no contour-deforming renal mass. No ureterolithiasis or hydroureter. The urinary bladder is unremarkable. Stomach/Bowel: Stomach is within normal limits. The proximal mid small bowel is mildly distended with fluid of to 3.2 cm in caliber. No associated pneumatosis. There is suggestion of a possible transition point within the anterior abdomen at the level of the umbilicus. The distal small bowel is collapsed. No evidence of large bowel wall thickening or dilatation. Appendix appears normal. Vascular/Lymphatic: No abdominal aorta or iliac aneurysm. Severe atherosclerotic plaque of the aorta and its branches. No abdominal, pelvic, or inguinal lymphadenopathy. Reproductive: Not visualized due to streak artifact originating from bilateral its femoral surgical hardware. Other: No intraperitoneal free fluid. No intraperitoneal free gas. No organized fluid collection. Musculoskeletal: Tiny Richter hernia containing the anti mesenteric wall of a very short segment of small bowel (where the  possible transition point is identified). No suspicious lytic or blastic osseous lesions. No acute displaced fracture. Multilevel degenerative changes of the spine. Partially visualized bilateral total hip arthroplasties. IMPRESSION: 1. Tiny Richter hernia containing a very short loop of small bowel with an associated early/partial small bowel obstruction. 2. Couple of tiny foci of gas noted within the collecting systems bilaterally. Findings could be related to recent ureteral stent placement versus could represent emphysematous pyelitis. Recommend correlation with urinalysis for urinary tract infection. 3. Enteric tube with tip within the gastric lumen and side port at the gastroesophageal junction. Consider advancing by 3-5cm. 4.  Aortic Atherosclerosis (ICD10-I70.0). Electronically Signed   By: Iven Finn M.D.   On: 03/04/2020 21:09   DG Abd 1 View  Result Date: 03/04/2020 CLINICAL DATA:  NG tube placement EXAM: ABDOMEN - 1 VIEW COMPARISON:  03/04/2020 FINDINGS: Esophageal tube tip projects over the mid gastric region. Dilated loops of small bowel suspicious for obstruction. Partially visualized ureteral stents IMPRESSION: Esophageal tube tip overlies the mid stomach. Electronically Signed   By: Donavan Foil M.D.   On: 03/04/2020 22:46   DG Abd 1 View  Result Date: 03/04/2020 CLINICAL DATA:  NG tube placement EXAM: ABDOMEN - 1 VIEW COMPARISON:  03/03/2020 FINDINGS: An enteric tube is present with tip in the left upper quadrant consistent with location in the upper stomach although the proximal side hole projects over the distal esophagus. Mild gaseous distention of the stomach. Gaseous distention of small bowel is demonstrated in the upper abdomen. IMPRESSION: Enteric tube tip projects over the upper stomach with proximal side hole over the distal esophagus. Electronically Signed   By: Lucienne Capers M.D.   On: 03/04/2020 02:02   DG Abd 1 View  Result Date: 03/03/2020 CLINICAL DATA:   Abdominal distension EXAM: ABDOMEN - 1 VIEW COMPARISON:  02/24/2020 FINDINGS: Diffusely gas distended bowel, including a loop of dilated small bowel in the left mid abdomen. Bilateral nephroureteral stents. IMPRESSION: Diffusely gas distended bowel, including a loop of dilated small bowel in the left mid abdomen. Electronically Signed   By: Ulyses Jarred M.D.   On: 03/03/2020 23:21   DG Abd Portable 1V-Small Bowel Obstruction Protocol-initial, 8 hr delay  Result Date: 03/05/2020 CLINICAL DATA:  SBO EXAM: PORTABLE ABDOMEN - 1 VIEW COMPARISON:  March 04, 2020 FINDINGS: Persistent gaseous dilation of loops of small bowel. Small bowel loop measures up to 5.2 cm, similar in comparison to prior. There is enteric contrast within a dilated loop of proximal small bowel. Enteric tube is coiled within the stomach with tip terminating over the distal stomach. Bilateral double-J stents. LEFT-sided surgical drain overlying the LEFT lateral leg. Soft tissue air of the LEFT lateral leg. Status post bilateral hip arthroplasty. Degenerative changes of the lower lumbar spine. IMPRESSION: Persistent gaseous dilation of loops of small bowel, similar in comparison to prior. Enteric contrast is within the dilated proximal small bowel. Electronically Signed   By: Valentino Saxon MD   On: 03/05/2020 11:12   [place] FNA SOFT TISSUE  Result Date: 03/04/2020 INDICATION: History of left total hip replacement, now with concern for infection. Please perform ultrasound-guided aspiration and/or drainage catheter placement for infection source control purposes. EXAM: ULTRASOUND-GUIDED DRAINAGE CATHETER PLACEMENT INTO DOMINANT FLUID COLLECTION INVOLVING THE PROXIMAL LATERAL ASPECT THE LEFT THIGH COMPARISON:  Left hip CT-03/01/2020 MEDICATIONS: The patient is currently admitted to the hospital and receiving intravenous antibiotics. The antibiotics were administered within an appropriate time frame prior to the initiation of the procedure.  ANESTHESIA/SEDATION: Moderate (conscious) sedation was employed during this procedure. A total of Versed 2 mg and Fentanyl 100 mcg was administered intravenously. Moderate Sedation Time: 18 minutes. The patient's level of consciousness and vital signs were monitored continuously by radiology nursing throughout the procedure under my direct supervision. CONTRAST:  None COMPLICATIONS: None immediate. PROCEDURE: Informed written consent was obtained from the patient after a discussion of the risks, benefits and alternatives to treatment. The patient was placed supine on his hospital bed with ultrasound imaging demonstrating a large (at least 7.6 x 1.6 cm complex fluid collection with the subcutaneous tissues involving the lateral aspect of the proximal left thigh (representative image 2). The procedure was planned. A timeout was performed prior to the initiation of the procedure. The skin overlying the lateral aspect the left thigh was prepped and draped in the usual sterile fashion. The overlying soft tissues were anesthetized with 1% lidocaine with epinephrine. Utilizing a caudal to cranial approach, an 18 gauge trocar needle was advanced through near the entirety of the abscess/fluid collection within the left lateral thigh and a short Amplatz super stiff wire was coiled within the cranial component of the collection. Appropriate positioning was confirmed ultrasound imaging. The tract was serially dilated allowing placement of a 12 [place] all-purpose drainage catheter. Appropriate positioning was confirmed with a limited postprocedural CT scan. Next, approximately 50 cc of purulent fluid was aspirated. The tube was connected to a JP bulb and sutured in place. A dressing was placed. The patient tolerated the procedure well without immediate post procedural complication. IMPRESSION: Successful CT guided placement of a 49 French all purpose drain catheter into the complex fluid collection involving the proximal lateral  aspect the left thigh with aspiration of 50 cc of purulent fluid. Samples were sent to the laboratory as requested by the ordering clinical team. Electronically Signed   By: Sandi Mariscal M.D.   On: 03/04/2020 15:43        [facility] [phone] pager 03/05/2020, 2:05 PM

## 2020-03-05 NOTE — Progress Notes (Signed)
Gastrografin 90 ml given via NG tube, Pt tolerated well no N/V, NG clamped for 1 hr and back to low suction intermittent.

## 2020-03-05 NOTE — Plan of Care (Signed)
  Problem: Education: Goal: Knowledge of General Education information will improve Description: Including pain rating scale, medication(s)/side effects and non-pharmacologic comfort measures Outcome: Progressing   Problem: Clinical Measurements: Goal: Will remain free from infection Outcome: Progressing Goal: Diagnostic test results will improve Outcome: Progressing   Problem: Activity: Goal: Risk for activity intolerance will decrease Outcome: Progressing   Problem: Nutrition: Goal: Adequate nutrition will be maintained Outcome: Progressing   Problem: Pain Managment: Goal: General experience of comfort will improve Outcome: Progressing   Problem: Safety: Goal: Ability to remain free from injury will improve Outcome: Progressing   Problem: Skin Integrity: Goal: Risk for impaired skin integrity will decrease Outcome: Progressing

## 2020-03-05 NOTE — Progress Notes (Signed)
Subjective/Chief Complaint: Reports minimal abdominal pain Passing flatus   Objective: Vital signs in last 24 hours: Temp:  [97.5 F (36.4 C)-98.1 F (36.7 C)] 97.9 F (36.6 C) (11/25 0554) Pulse Rate:  [57-117] 110 (11/25 0554) Resp:  [17-22] 20 (11/25 0554) BP: (136-156)/(67-83) 143/82 (11/25 0554) SpO2:  [97 %-100 %] 98 % (11/25 0554) Last BM Date: 02/25/20  Intake/Output from previous day: 11/24 0701 - 11/25 0700 In: 444.6 [I.V.:384.6; NG/GT:60] Out: 2500 [Urine:900; Emesis/NG output:1600] Intake/Output this shift: No intake/output data recorded.  Exam: Awake and alert NG in place Abdomen distended, no peritoneal signs Small fascial defect at the umbilicus with nothing incarcerated in it  Lab Results:  Recent Labs    03/04/20 0537 03/05/20 0428  WBC 8.2 7.4  HGB 9.8* 10.3*  HCT 30.9* 32.1*  PLT 596* 601*   BMET Recent Labs    03/04/20 0537 03/05/20 0428  NA 135 138  K 4.7 4.6  CL 103 105  CO2 18* 18*  GLUCOSE 198* 186*  BUN 91* 117*  CREATININE 2.20* 2.91*  CALCIUM 8.6* 8.6*   PT/INR Recent Labs    03/04/20 1252  LABPROT 13.9  INR 1.1   ABG No results for input(s): PHART, HCO3 in the last 72 hours.  Invalid input(s): PCO2, PO2  Studies/Results: CT ABDOMEN PELVIS WO CONTRAST  Result Date: 03/04/2020 CLINICAL DATA:  Nausea vomiting. EXAM: CT ABDOMEN AND PELVIS WITHOUT CONTRAST TECHNIQUE: Multidetector CT imaging of the abdomen and pelvis was performed following the standard protocol without IV contrast. COMPARISON:  CT abdomen pelvis 01/14/2020 FINDINGS: Lines and tubes: Enteric tube noted with tip within the gastric lumen and side port at the gastroesophageal junction. Bilateral ureteral stents with proximal pigtails in a superior pole calyx and distal pigtails not visualized due to streak artifact originating from bilateral femoral surgical hardware. Distal pigtails of the ureteral stents terminate in the expected region of the urinary  bladder. Lower chest: Coronary artery calcifications. Trace pericardial effusion. No acute abnormality. Hepatobiliary: No focal liver abnormality. No gallstones, gallbladder wall thickening, or pericholecystic fluid. No biliary dilatation. Pancreas: No focal lesion. Normal pancreatic contour. No surrounding inflammatory changes. No main pancreatic ductal dilatation. Spleen: Normal in size. There is a 1.6 cm fluid density lesion within the spleen likely represents a simple cyst. Otherwise no focal abnormality. Adrenals/Urinary Tract: No adrenal nodule bilaterally. Couple of tiny foci of gas noted within the collecting systems bilaterally. No nephrolithiasis, no hydronephrosis, and no contour-deforming renal mass. No ureterolithiasis or hydroureter. The urinary bladder is unremarkable. Stomach/Bowel: Stomach is within normal limits. The proximal mid small bowel is mildly distended with fluid of to 3.2 cm in caliber. No associated pneumatosis. There is suggestion of a possible transition point within the anterior abdomen at the level of the umbilicus. The distal small bowel is collapsed. No evidence of large bowel wall thickening or dilatation. Appendix appears normal. Vascular/Lymphatic: No abdominal aorta or iliac aneurysm. Severe atherosclerotic plaque of the aorta and its branches. No abdominal, pelvic, or inguinal lymphadenopathy. Reproductive: Not visualized due to streak artifact originating from bilateral its femoral surgical hardware. Other: No intraperitoneal free fluid. No intraperitoneal free gas. No organized fluid collection. Musculoskeletal: Tiny Richter hernia containing the anti mesenteric wall of a very short segment of small bowel (where the possible transition point is identified). No suspicious lytic or blastic osseous lesions. No acute displaced fracture. Multilevel degenerative changes of the spine. Partially visualized bilateral total hip arthroplasties. IMPRESSION: 1. Tiny Richter hernia  containing a very  short loop of small bowel with an associated early/partial small bowel obstruction. 2. Couple of tiny foci of gas noted within the collecting systems bilaterally. Findings could be related to recent ureteral stent placement versus could represent emphysematous pyelitis. Recommend correlation with urinalysis for urinary tract infection. 3. Enteric tube with tip within the gastric lumen and side port at the gastroesophageal junction. Consider advancing by 3-5cm. 4.  Aortic Atherosclerosis (ICD10-I70.0). Electronically Signed   By: Iven Finn M.D.   On: 03/04/2020 21:09   DG Abd 1 View  Result Date: 03/04/2020 CLINICAL DATA:  NG tube placement EXAM: ABDOMEN - 1 VIEW COMPARISON:  03/04/2020 FINDINGS: Esophageal tube tip projects over the mid gastric region. Dilated loops of small bowel suspicious for obstruction. Partially visualized ureteral stents IMPRESSION: Esophageal tube tip overlies the mid stomach. Electronically Signed   By: Donavan Foil M.D.   On: 03/04/2020 22:46   DG Abd 1 View  Result Date: 03/04/2020 CLINICAL DATA:  NG tube placement EXAM: ABDOMEN - 1 VIEW COMPARISON:  03/03/2020 FINDINGS: An enteric tube is present with tip in the left upper quadrant consistent with location in the upper stomach although the proximal side hole projects over the distal esophagus. Mild gaseous distention of the stomach. Gaseous distention of small bowel is demonstrated in the upper abdomen. IMPRESSION: Enteric tube tip projects over the upper stomach with proximal side hole over the distal esophagus. Electronically Signed   By: Lucienne Capers M.D.   On: 03/04/2020 02:02   DG Abd 1 View  Result Date: 03/03/2020 CLINICAL DATA:  Abdominal distension EXAM: ABDOMEN - 1 VIEW COMPARISON:  02/24/2020 FINDINGS: Diffusely gas distended bowel, including a loop of dilated small bowel in the left mid abdomen. Bilateral nephroureteral stents. IMPRESSION: Diffusely gas distended bowel, including a  loop of dilated small bowel in the left mid abdomen. Electronically Signed   By: Ulyses Jarred M.D.   On: 03/03/2020 23:21   [place] FNA SOFT TISSUE  Result Date: 03/04/2020 INDICATION: History of left total hip replacement, now with concern for infection. Please perform ultrasound-guided aspiration and/or drainage catheter placement for infection source control purposes. EXAM: ULTRASOUND-GUIDED DRAINAGE CATHETER PLACEMENT INTO DOMINANT FLUID COLLECTION INVOLVING THE PROXIMAL LATERAL ASPECT THE LEFT THIGH COMPARISON:  Left hip CT-03/01/2020 MEDICATIONS: The patient is currently admitted to the hospital and receiving intravenous antibiotics. The antibiotics were administered within an appropriate time frame prior to the initiation of the procedure. ANESTHESIA/SEDATION: Moderate (conscious) sedation was employed during this procedure. A total of Versed 2 mg and Fentanyl 100 mcg was administered intravenously. Moderate Sedation Time: 18 minutes. The patient's level of consciousness and vital signs were monitored continuously by radiology nursing throughout the procedure under my direct supervision. CONTRAST:  None COMPLICATIONS: None immediate. PROCEDURE: Informed written consent was obtained from the patient after a discussion of the risks, benefits and alternatives to treatment. The patient was placed supine on his hospital bed with ultrasound imaging demonstrating a large (at least 7.6 x 1.6 cm complex fluid collection with the subcutaneous tissues involving the lateral aspect of the proximal left thigh (representative image 2). The procedure was planned. A timeout was performed prior to the initiation of the procedure. The skin overlying the lateral aspect the left thigh was prepped and draped in the usual sterile fashion. The overlying soft tissues were anesthetized with 1% lidocaine with epinephrine. Utilizing a caudal to cranial approach, an 18 gauge trocar needle was advanced through near the entirety of the  abscess/fluid collection within the left  lateral thigh and a short Amplatz super stiff wire was coiled within the cranial component of the collection. Appropriate positioning was confirmed ultrasound imaging. The tract was serially dilated allowing placement of a 12 [place] all-purpose drainage catheter. Appropriate positioning was confirmed with a limited postprocedural CT scan. Next, approximately 50 cc of purulent fluid was aspirated. The tube was connected to a JP bulb and sutured in place. A dressing was placed. The patient tolerated the procedure well without immediate post procedural complication. IMPRESSION: Successful CT guided placement of a 47 French all purpose drain catheter into the complex fluid collection involving the proximal lateral aspect the left thigh with aspiration of 50 cc of purulent fluid. Samples were sent to the laboratory as requested by the ordering clinical team. Electronically Signed   By: Sandi Mariscal M.D.   On: 03/04/2020 15:43    Anti-infectives: Anti-infectives (From admission, onward)   None      Assessment/Plan: SBO vs ileus  Small bowel protocol films pending Continue NG to suction  LOS: 2 days    Coralie Keens MD 03/05/2020

## 2020-03-05 NOTE — Plan of Care (Signed)
  Problem: Education: Goal: Knowledge of General Education information will improve Description: Including pain rating scale, medication(s)/side effects and non-pharmacologic comfort measures Outcome: Progressing   Problem: Clinical Measurements: Goal: Diagnostic test results will improve Outcome: Progressing   Problem: Activity: Goal: Risk for activity intolerance will decrease Outcome: Progressing   Problem: Nutrition: Goal: Adequate nutrition will be maintained Outcome: Progressing   Problem: Pain Managment: Goal: General experience of comfort will improve Outcome: Progressing   Problem: Safety: Goal: Ability to remain free from injury will improve Outcome: Progressing   Problem: Skin Integrity: Goal: Risk for impaired skin integrity will decrease Outcome: Progressing

## 2020-03-06 ENCOUNTER — Inpatient Hospital Stay (HOSPITAL_COMMUNITY): Payer: HMO

## 2020-03-06 DIAGNOSIS — B9689 Other specified bacterial agents as the cause of diseases classified elsewhere: Secondary | ICD-10-CM

## 2020-03-06 DIAGNOSIS — I4891 Unspecified atrial fibrillation: Secondary | ICD-10-CM | POA: Diagnosis not present

## 2020-03-06 DIAGNOSIS — M25452 Effusion, left hip: Secondary | ICD-10-CM | POA: Diagnosis not present

## 2020-03-06 LAB — BLOOD CULTURE ID PANEL (REFLEXED) - BCID2

## 2020-03-06 LAB — COMPREHENSIVE METABOLIC PANEL
ALT: 19 U/L (ref 0–44)
AST: 15 U/L (ref 15–41)
Albumin: 2.6 g/dL — ABNORMAL LOW (ref 3.5–5.0)
Alkaline Phosphatase: 143 U/L — ABNORMAL HIGH (ref 38–126)
Anion gap: 16 — ABNORMAL HIGH (ref 5–15)
BUN: 132 mg/dL — ABNORMAL HIGH (ref 8–23)
CO2: 16 mmol/L — ABNORMAL LOW (ref 22–32)
Calcium: 8.4 mg/dL — ABNORMAL LOW (ref 8.9–10.3)
Chloride: 106 mmol/L (ref 98–111)
Creatinine, Ser: 2.7 mg/dL — ABNORMAL HIGH (ref 0.61–1.24)
GFR, Estimated: 24 mL/min — ABNORMAL LOW (ref >60)
Glucose, Bld: 191 mg/dL — ABNORMAL HIGH (ref 70–99)
Potassium: 4.3 mmol/L (ref 3.5–5.1)
Sodium: 138 mmol/L (ref 135–145)
Total Bilirubin: 0.8 mg/dL (ref 0.3–1.2)
Total Protein: 8.1 g/dL (ref 6.5–8.1)

## 2020-03-06 LAB — CBC WITH DIFFERENTIAL/PLATELET
Abs Immature Granulocytes: 0.04 10*3/uL (ref 0.00–0.07)
Basophils Absolute: 0 10*3/uL (ref 0.0–0.1)
Basophils Relative: 0 %
Eosinophils Absolute: 0.1 10*3/uL (ref 0.0–0.5)
Eosinophils Relative: 1 %
HCT: 30.2 % — ABNORMAL LOW (ref 39.0–52.0)
Hemoglobin: 9.9 g/dL — ABNORMAL LOW (ref 13.0–17.0)
Immature Granulocytes: 0 %
Lymphocytes Relative: 6 %
Lymphs Abs: 0.6 10*3/uL — ABNORMAL LOW (ref 0.7–4.0)
MCH: 30.9 pg (ref 26.0–34.0)
MCHC: 32.8 g/dL (ref 30.0–36.0)
MCV: 94.4 fL (ref 80.0–100.0)
Monocytes Absolute: 0.8 10*3/uL (ref 0.1–1.0)
Monocytes Relative: 8 %
Neutro Abs: 8.3 10*3/uL — ABNORMAL HIGH (ref 1.7–7.7)
Neutrophils Relative %: 85 %
Platelets: 605 10*3/uL — ABNORMAL HIGH (ref 150–400)
RBC: 3.2 MIL/uL — ABNORMAL LOW (ref 4.22–5.81)
RDW: 14.8 % (ref 11.5–15.5)
WBC: 9.9 10*3/uL (ref 4.0–10.5)
nRBC: 0 % (ref 0.0–0.2)

## 2020-03-06 LAB — ECHOCARDIOGRAM COMPLETE
Area-P 1/2: 4.6 cm2
Calc EF: 57.3 %
Height: 74 in
S' Lateral: 2.8 cm
Single Plane A2C EF: 50 %
Single Plane A4C EF: 61.4 %
Weight: 3890.68 oz

## 2020-03-06 LAB — GLUCOSE, CAPILLARY
Glucose-Capillary: 164 mg/dL — ABNORMAL HIGH (ref 70–99)
Glucose-Capillary: 182 mg/dL — ABNORMAL HIGH (ref 70–99)
Glucose-Capillary: 150 mg/dL — ABNORMAL HIGH (ref 70–99)
Glucose-Capillary: 169 mg/dL — ABNORMAL HIGH (ref 70–99)

## 2020-03-06 LAB — HEPARIN LEVEL (UNFRACTIONATED)
Heparin Unfractionated: 0.94 IU/mL — ABNORMAL HIGH (ref 0.30–0.70)
Heparin Unfractionated: 1.02 IU/mL — ABNORMAL HIGH (ref 0.30–0.70)
Heparin Unfractionated: 0.63 IU/mL (ref 0.30–0.70)

## 2020-03-06 LAB — MAGNESIUM: Magnesium: 3.3 mg/dL — ABNORMAL HIGH (ref 1.7–2.4)

## 2020-03-06 LAB — PHOSPHORUS: Phosphorus: 6.3 mg/dL — ABNORMAL HIGH (ref 2.5–4.6)

## 2020-03-06 MED ORDER — HYDROMORPHONE HCL 1 MG/ML IJ SOLN
1.0000 mg | Freq: Once | INTRAMUSCULAR | Status: AC
  Administered 2020-03-06: 1 mg via INTRAVENOUS
  Filled 2020-03-06: qty 1

## 2020-03-06 MED ORDER — METOPROLOL TARTRATE 25 MG PO TABS
12.5000 mg | ORAL_TABLET | Freq: Two times a day (BID) | ORAL | Status: DC
  Administered 2020-03-07 – 2020-03-11 (×7): 12.5 mg via ORAL
  Filled 2020-03-06 (×7): qty 1

## 2020-03-06 MED ORDER — SODIUM CHLORIDE 0.9% FLUSH
5.0000 mL | Freq: Three times a day (TID) | INTRAVENOUS | Status: DC
  Administered 2020-03-06 – 2020-03-20 (×26): 5 mL

## 2020-03-06 MED ORDER — SODIUM CHLORIDE 0.9 % IV SOLN
2.0000 g | Freq: Two times a day (BID) | INTRAVENOUS | Status: DC
  Administered 2020-03-06 – 2020-03-14 (×15): 2 g via INTRAVENOUS
  Filled 2020-03-06 (×17): qty 2

## 2020-03-06 MED ORDER — SODIUM BICARBONATE 8.4 % IV SOLN
INTRAVENOUS | Status: DC
  Filled 2020-03-06 (×2): qty 150
  Filled 2020-03-06: qty 850

## 2020-03-06 MED ORDER — HYDROMORPHONE HCL 1 MG/ML IJ SOLN
1.0000 mg | INTRAMUSCULAR | Status: DC | PRN
  Administered 2020-03-06 – 2020-03-12 (×10): 1 mg via INTRAVENOUS
  Filled 2020-03-06 (×10): qty 1

## 2020-03-06 MED ORDER — HEPARIN (PORCINE) 25000 UT/250ML-% IV SOLN
900.0000 [IU]/h | INTRAVENOUS | Status: DC
  Administered 2020-03-06 – 2020-03-07 (×2): 900 [IU]/h via INTRAVENOUS
  Filled 2020-03-06 (×2): qty 250

## 2020-03-06 NOTE — Progress Notes (Signed)
[place] for heparin Indication: atrial fibrillation  Allergies  Allergen Reactions  . Shellfish Allergy Rash    Patient Measurements: Height: 6\' 2"  (188 cm) Weight: 110.3 kg (243 lb 2.7 oz) IBW/kg (Calculated) : 82.2 Heparin Dosing Weight: 105 kg  Vital Signs: Temp: 97.9 F (36.6 C) (11/26 0400) Temp Source: Oral (11/26 0400) BP: 171/78 (11/26 1200) Pulse Rate: 100 (11/26 1200)  Labs: Recent Labs    03/04/20 0537 03/04/20 0537 03/04/20 1252 03/05/20 0428 03/06/20 0325  HGB 9.8*   < >  --  10.3* 9.9*  HCT 30.9*  --   --  32.1* 30.2*  PLT 596*  --   --  601* 605*  LABPROT  --   --  13.9  --   --   INR  --   --  1.1  --   --   HEPARINUNFRC  --   --   --   --  0.94*  CREATININE 2.20*  --   --  2.91* 2.70*   < > = values in this interval not displayed.    Estimated Creatinine Clearance: 31.7 mL/min (A) (by C-G formula based on SCr of 2.7 mg/dL (H)).   Assessment: Patient is a 74 y.o M with hx afib (not on anticoag. PTA) presented to the ED from [facility] for management of septic left hip with plan for I&D on 03/09/20.  He was found to be in afib. Pharmacy is consulted to start heparin for afib.  Significant Events: - 11/24: IR placement of drainage cath into hip abscess  Today, 03/06/2020: - Heparin level is supra-therapeutic at 1.02 with heparin drip running at  at 1200 units/hr - CBC: Hg low/stable, pltc elevated/stable - Scr remains elevated at 2.7 - RN reports no bleeding or infusion related issues  Goal of Therapy:  Heparin level 0.3-0.7 units/ml Monitor platelets by anticoagulation protocol: Yes   Plan:  - hold heparin drip for 1 hr, then resume back at 900 units/hr  - Recheck heparin level 8h after rate change - monitor for s/sx bleeding - please advise when heparin drip needs to held for OR procedure on 11/29  Lynelle Doctor PharmD, BCPS 03/06/2020,1:01 PM

## 2020-03-06 NOTE — Progress Notes (Signed)
Pharmacy Antibiotic Note  [name] is a 74 y.o. male presented to the ED on 03/03/2020 from [facility] for management of septic left hip with plan for I&D on 03/09/20. He underwent drainage cath placement into the hip abscess on 11/24 with culture growing out enterobacter cloacae.  Blood culture collected on 11/23 is also positive for enterobacter cloacae.  Patient was started on zosyn on 11/25. Pharmacy is consulted on 11/26 to change abx to cefepime.  Today, 03/06/2020: - Day #2 abx   - afeb, wbc wnl - scr 2.70 (crcl~32)--> on sodium bicarb drip  Plan: - cefepime 2gm IV q12h - monitor renal function closely ______________________________________  Height: 6\' 2"  (188 cm) Weight: 110.3 kg (243 lb 2.7 oz) IBW/kg (Calculated) : 82.2  Temp (24hrs), Avg:98.1 F (36.7 C), Min:97.8 F (36.6 C), Max:98.2 F (36.8 C)  Recent Labs  Lab 03/01/20 0925 03/04/20 0537 03/05/20 0428 03/06/20 0325  WBC 10.1 8.2 7.4 9.9  CREATININE 2.17* 2.20* 2.91* 2.70*    Estimated Creatinine Clearance: 31.7 mL/min (A) (by C-G formula based on SCr of 2.7 mg/dL (H)).    Allergies  Allergen Reactions  . Shellfish Allergy Rash    Antimicrobials this admission:  11/25 zosyn>>11/26 11/26 cefepime>>  Microbiology results:  11/25 UCx: >100K GNR 11/24 left thigh abscess: enterobacter cloacae (R= ancef) FINAL 11/23 bcx x2: 1/2 GNR (BCID= Enterobacter cloacae complex) Thank you for allowing pharmacy to be a part of this patient's care.  Lynelle Doctor 03/06/2020 1:15 PM

## 2020-03-06 NOTE — Progress Notes (Signed)
  Echocardiogram 2D Echocardiogram has been performed.  [name] 03/06/2020, 2:11 PM

## 2020-03-06 NOTE — Progress Notes (Signed)
PHARMACY - PHYSICIAN COMMUNICATION CRITICAL VALUE ALERT - BLOOD CULTURE IDENTIFICATION (BCID)  [name] is an 74 y.o. male who presented to [facility] on 03/03/2020 with a chief complaint of hip/knee pain (history of prior hip replacement)  Assessment: Septic arthritis of his hip, Patient will need I&D of L hip once medically stable.  Tentative plan to OR on Monday.  ID is consulted. Urine culture with >100k GNR, Abscess culture with moderate GNR. Blood cultures: only 2 aerobic bottles obtained.  1/2 bottles with Enterobacter cloacae complex.  Name of physician (or Provider) Contacted: Dr Florene Glen  Current antibiotics: Zosyn 3.375g IV q8h  Changes to prescribed antibiotics recommended:  Continue Zosyn.  Results for orders placed or performed during the hospital encounter of 03/03/20  Blood Culture ID Panel (Reflexed) (Collected: 03/03/2020  7:04 PM)  Result Value Ref Range   Enterococcus faecalis NOT DETECTED NOT DETECTED   Enterococcus Faecium NOT DETECTED NOT DETECTED   Listeria monocytogenes NOT DETECTED NOT DETECTED   Staphylococcus species NOT DETECTED NOT DETECTED   Staphylococcus aureus (BCID) NOT DETECTED NOT DETECTED   Staphylococcus epidermidis NOT DETECTED NOT DETECTED   Staphylococcus lugdunensis NOT DETECTED NOT DETECTED   Streptococcus species NOT DETECTED NOT DETECTED   Streptococcus agalactiae NOT DETECTED NOT DETECTED   Streptococcus pneumoniae NOT DETECTED NOT DETECTED   Streptococcus pyogenes NOT DETECTED NOT DETECTED   A.calcoaceticus-baumannii NOT DETECTED NOT DETECTED   Bacteroides fragilis NOT DETECTED NOT DETECTED   Enterobacterales DETECTED (A) NOT DETECTED   Enterobacter cloacae complex DETECTED (A) NOT DETECTED   Escherichia coli NOT DETECTED NOT DETECTED   Klebsiella aerogenes NOT DETECTED NOT DETECTED   Klebsiella oxytoca NOT DETECTED NOT DETECTED   Klebsiella pneumoniae NOT DETECTED NOT DETECTED   Proteus species NOT DETECTED NOT DETECTED    Salmonella species NOT DETECTED NOT DETECTED   Serratia marcescens NOT DETECTED NOT DETECTED   Haemophilus influenzae NOT DETECTED NOT DETECTED   Neisseria meningitidis NOT DETECTED NOT DETECTED   Pseudomonas aeruginosa NOT DETECTED NOT DETECTED   Stenotrophomonas maltophilia NOT DETECTED NOT DETECTED   Candida albicans NOT DETECTED NOT DETECTED   Candida auris NOT DETECTED NOT DETECTED   Candida glabrata NOT DETECTED NOT DETECTED   Candida krusei NOT DETECTED NOT DETECTED   Candida parapsilosis NOT DETECTED NOT DETECTED   Candida tropicalis NOT DETECTED NOT DETECTED   Cryptococcus neoformans/gattii NOT DETECTED NOT DETECTED   CTX-M ESBL NOT DETECTED NOT DETECTED   Carbapenem resistance IMP NOT DETECTED NOT DETECTED   Carbapenem resistance KPC NOT DETECTED NOT DETECTED   Carbapenem resistance NDM NOT DETECTED NOT DETECTED   Carbapenem resist OXA 48 LIKE NOT DETECTED NOT DETECTED   Carbapenem resistance VIM NOT DETECTED NOT DETECTED      Gretta Arab PharmD, BCPS Clinical Pharmacist [facility] main pharmacy [phone] 03/06/2020 9:19 AM

## 2020-03-06 NOTE — Progress Notes (Signed)
  Echocardiogram 2D Echocardiogram has been performed.  Bobbye Charleston 03/06/2020, 2:11 PM

## 2020-03-06 NOTE — Progress Notes (Signed)
Subjective:     Patient reports that he feels a little better today compared to yesterday.  Resting comfortably in chair.  Objective:   VITALS:  Temp:  [97.8 F (36.6 C)-98.2 F (36.8 C)] 97.9 F (36.6 C) (11/26 0400) Pulse Rate:  [49-118] 102 (11/26 0900) Resp:  [13-20] 15 (11/26 0900) BP: (137-170)/(57-94) 147/82 (11/26 0900) SpO2:  [96 %-100 %] 96 % (11/26 0900)  General: WDWN patient in NAD. Psych:  Appropriate mood and affect. Neuro:  A&O x 3, Moving all extremities, sensation intact to light touch HEENT:  EOMs intact Chest:  Even non-labored respirations Skin:   C/D/I, no rashes or lesions Extremities: warm/dry, mild edema/erythema to L hip.  No lymphadenopathy. Pulses: Popliteus 2+ MSK:  ROM: TKE, MMT: able to perform quad set   LABS Recent Labs    03/04/20 0537 03/04/20 0537 03/05/20 0428 03/06/20 0325  HGB 9.8*  --  10.3* 9.9*  WBC 8.2   < > 7.4 9.9  PLT 596*   < > 601* 605*   < > = values in this interval not displayed.   Recent Labs    03/05/20 0428 03/06/20 0325  NA 138 138  K 4.6 4.3  CL 105 106  CO2 18* 16*  BUN 117* 132*  CREATININE 2.91* 2.70*  GLUCOSE 186* 191*   Recent Labs    03/04/20 1252  INR 1.1     Assessment/Plan:    Septic arthritis of L hip:  Patient seen in rounds for Dr. Wynelle Link Patient will need I&D of L hip once medically stable Tentative plan to OR with Dr. Wynelle Link on Monday, if cleared by Medicine team. ABX per ID  Mechele Claude PA-C [facility] Office:  [phone]

## 2020-03-06 NOTE — Progress Notes (Signed)
Referring Physician(s): Aluisio,F  Supervising Physician: Jacqulynn Cadet  Patient Status:  [name] - In-pt  Chief Complaint:  Left hip pain/abscess  Subjective: Pt still with left hip/upper lat thigh discomfort; denies CP, worsening dysnea   Allergies: Shellfish allergy  Medications: Prior to Admission medications   Medication Sig Start Date End Date Taking? Authorizing Provider  albuterol (VENTOLIN HFA) 108 (90 Base) MCG/ACT inhaler Inhale 2 puffs into the lungs every 6 (six) hours as needed for wheezing or shortness of breath. 08/09/19 03/03/20 Yes Luetta Nutting, DO  amLODipine (NORVASC) 10 MG tablet Take 10 mg by mouth daily.   Yes [provider]  Aspirin 81 MG CAPS Take 81 mg by mouth daily.    Yes [provider]  atorvastatin (LIPITOR) 40 MG tablet Take 1 tablet (40 mg total) by mouth daily. 06/26/16  Yes Tat, Shanon Brow, MD  carvedilol (COREG) 25 MG tablet Take 25 mg by mouth 2 (two) times daily with a meal.   Yes [provider]  co-enzyme Q-10 30 MG capsule Take 30 mg by mouth daily.   Yes [provider]  cycloSPORINE (RESTASIS) 0.05 % ophthalmic emulsion Place 1 drop into both eyes 2 (two) times daily.  09/23/19  Yes [provider]  diclofenac Sodium (VOLTAREN) 1 % GEL Apply 4 g topically 4 (four) times daily. 03/01/20  Yes Deno Etienne, DO  docusate sodium (COLACE) 100 MG capsule Take 100 mg by mouth daily as needed for mild constipation.   Yes [provider]  ferrous sulfate 325 (65 FE) MG tablet Take 325 mg by mouth daily with breakfast.   Yes [provider]  gabapentin (NEURONTIN) 300 MG capsule Take 1 capsule (300 mg total) by mouth 3 (three) times daily. Start 300mg  at bedtime x1 week then may increase to three times per day. Patient taking differently: Take 300 mg by mouth in the morning, at noon, and at bedtime.  10/21/19  Yes Luetta Nutting, DO  glimepiride (AMARYL) 4 MG tablet Take 4 mg by mouth daily  before breakfast.   Yes [provider]  hydrochlorothiazide (HYDRODIURIL) 25 MG tablet Take 25 mg by mouth daily. 02/28/20  Yes [provider]  morphine (MSIR) 15 MG tablet Take 0.5 tablets (7.5 mg total) by mouth every 4 (four) hours as needed for severe pain. 03/01/20  Yes Deno Etienne, DO  predniSONE (DELTASONE) 50 MG tablet Take daily until resolution of gout symptoms. Patient taking differently: Take 50 mg by mouth daily. Take daily until resolution of gout symptoms. 02/21/20  Yes Luetta Nutting, DO  sitaGLIPtin (JANUVIA) 50 MG tablet Take 1 tablet (50 mg total) by mouth daily. 06/26/16  Yes Tat, Shanon Brow, MD  spironolactone (ALDACTONE) 25 MG tablet Take 25 mg by mouth daily. 02/28/20  Yes [provider]  Continuous Blood Gluc Receiver (FREESTYLE LIBRE 14 DAY READER) [place] See admin instructions. 05/23/19   [provider]  Continuous Blood Gluc Sensor (FREESTYLE LIBRE 14 DAY SENSOR) MISC 1 Device by Does not apply route every 14 (fourteen) days. 05/23/19   Luetta Nutting, DO  omeprazole (PRILOSEC) 40 MG capsule Take 1 capsule (40 mg total) by mouth 2 (two) times daily. X 1 month, then once daily Patient not taking: Reported on 03/03/2020 06/26/16   Orson Eva, MD     Vital Signs: BP (!) 171/78   Pulse 100   Temp 97.9 F (36.6 C) (Oral)   Resp 19   Ht 6\' 2"  (1.88 m)   Wt 243  lb 2.7 oz (110.3 kg)   SpO2 94%   BMI 31.22 kg/m   Physical Exam awake, answers questions ok; left lat thigh drain intact, insertion site ok, mildly tender, OP 15 cc brown fluid  Imaging: CT ABDOMEN PELVIS WO CONTRAST  Result Date: 03/04/2020 CLINICAL DATA:  Nausea vomiting. EXAM: CT ABDOMEN AND PELVIS WITHOUT CONTRAST TECHNIQUE: Multidetector CT imaging of the abdomen and pelvis was performed following the standard protocol without IV contrast. COMPARISON:  CT abdomen pelvis 01/14/2020 FINDINGS: Lines and tubes: Enteric tube noted with tip within the gastric lumen and side port  at the gastroesophageal junction. Bilateral ureteral stents with proximal pigtails in a superior pole calyx and distal pigtails not visualized due to streak artifact originating from bilateral femoral surgical hardware. Distal pigtails of the ureteral stents terminate in the expected region of the urinary bladder. Lower chest: Coronary artery calcifications. Trace pericardial effusion. No acute abnormality. Hepatobiliary: No focal liver abnormality. No gallstones, gallbladder wall thickening, or pericholecystic fluid. No biliary dilatation. Pancreas: No focal lesion. Normal pancreatic contour. No surrounding inflammatory changes. No main pancreatic ductal dilatation. Spleen: Normal in size. There is a 1.6 cm fluid density lesion within the spleen likely represents a simple cyst. Otherwise no focal abnormality. Adrenals/Urinary Tract: No adrenal nodule bilaterally. Couple of tiny foci of gas noted within the collecting systems bilaterally. No nephrolithiasis, no hydronephrosis, and no contour-deforming renal mass. No ureterolithiasis or hydroureter. The urinary bladder is unremarkable. Stomach/Bowel: Stomach is within normal limits. The proximal mid small bowel is mildly distended with fluid of to 3.2 cm in caliber. No associated pneumatosis. There is suggestion of a possible transition point within the anterior abdomen at the level of the umbilicus. The distal small bowel is collapsed. No evidence of large bowel wall thickening or dilatation. Appendix appears normal. Vascular/Lymphatic: No abdominal aorta or iliac aneurysm. Severe atherosclerotic plaque of the aorta and its branches. No abdominal, pelvic, or inguinal lymphadenopathy. Reproductive: Not visualized due to streak artifact originating from bilateral its femoral surgical hardware. Other: No intraperitoneal free fluid. No intraperitoneal free gas. No organized fluid collection. Musculoskeletal: Tiny Richter hernia containing the anti mesenteric wall of a  very short segment of small bowel (where the possible transition point is identified). No suspicious lytic or blastic osseous lesions. No acute displaced fracture. Multilevel degenerative changes of the spine. Partially visualized bilateral total hip arthroplasties. IMPRESSION: 1. Tiny Richter hernia containing a very short loop of small bowel with an associated early/partial small bowel obstruction. 2. Couple of tiny foci of gas noted within the collecting systems bilaterally. Findings could be related to recent ureteral stent placement versus could represent emphysematous pyelitis. Recommend correlation with urinalysis for urinary tract infection. 3. Enteric tube with tip within the gastric lumen and side port at the gastroesophageal junction. Consider advancing by 3-5cm. 4.  Aortic Atherosclerosis (ICD10-I70.0). Electronically Signed   By: Iven Finn M.D.   On: 03/04/2020 21:09   DG Abd 1 View  Result Date: 03/04/2020 CLINICAL DATA:  NG tube placement EXAM: ABDOMEN - 1 VIEW COMPARISON:  03/04/2020 FINDINGS: Esophageal tube tip projects over the mid gastric region. Dilated loops of small bowel suspicious for obstruction. Partially visualized ureteral stents IMPRESSION: Esophageal tube tip overlies the mid stomach. Electronically Signed   By: Donavan Foil M.D.   On: 03/04/2020 22:46   DG Abd 1 View  Result Date: 03/04/2020 CLINICAL DATA:  NG tube placement EXAM: ABDOMEN - 1 VIEW COMPARISON:  03/03/2020 FINDINGS: An enteric tube is present with  tip in the left upper quadrant consistent with location in the upper stomach although the proximal side hole projects over the distal esophagus. Mild gaseous distention of the stomach. Gaseous distention of small bowel is demonstrated in the upper abdomen. IMPRESSION: Enteric tube tip projects over the upper stomach with proximal side hole over the distal esophagus. Electronically Signed   By: Lucienne Capers M.D.   On: 03/04/2020 02:02   DG Abd 1  View  Result Date: 03/03/2020 CLINICAL DATA:  Abdominal distension EXAM: ABDOMEN - 1 VIEW COMPARISON:  02/24/2020 FINDINGS: Diffusely gas distended bowel, including a loop of dilated small bowel in the left mid abdomen. Bilateral nephroureteral stents. IMPRESSION: Diffusely gas distended bowel, including a loop of dilated small bowel in the left mid abdomen. Electronically Signed   By: Ulyses Jarred M.D.   On: 03/03/2020 23:21   DG Abd Portable 1V  Result Date: 03/06/2020 CLINICAL DATA:  Small-bowel obstruction. EXAM: PORTABLE ABDOMEN - 1 VIEW COMPARISON:  03/05/2020 abdominal radiographs and prior. FINDINGS: Indwelling enteric tube with tip and side hole overlying the gastric body. Increased conspicuity of gas-filled mildly dilated small bowel loops. Residual enteric contrast opacifies nondilated ascending colon. Bilateral double-J ureteral stents are unchanged. Sequela of bilateral hip arthroplasties. Partially imaged left hip surgical drain. IMPRESSION: Mildly dilated small bowel loops, more conspicuous than prior exam. Indwelling enteric tube and bilateral ureteral stents, unchanged. Electronically Signed   By: Primitivo Gauze M.D.   On: 03/06/2020 10:02   DG Abd Portable 1V-Small Bowel Obstruction Protocol-initial, 8 hr delay  Result Date: 03/05/2020 CLINICAL DATA:  SBO EXAM: PORTABLE ABDOMEN - 1 VIEW COMPARISON:  March 04, 2020 FINDINGS: Persistent gaseous dilation of loops of small bowel. Small bowel loop measures up to 5.2 cm, similar in comparison to prior. There is enteric contrast within a dilated loop of proximal small bowel. Enteric tube is coiled within the stomach with tip terminating over the distal stomach. Bilateral double-J stents. LEFT-sided surgical drain overlying the LEFT lateral leg. Soft tissue air of the LEFT lateral leg. Status post bilateral hip arthroplasty. Degenerative changes of the lower lumbar spine. IMPRESSION: Persistent gaseous dilation of loops of small  bowel, similar in comparison to prior. Enteric contrast is within the dilated proximal small bowel. Electronically Signed   By: Valentino Saxon MD   On: 03/05/2020 11:12   [place] FNA SOFT TISSUE  Result Date: 03/04/2020 INDICATION: History of left total hip replacement, now with concern for infection. Please perform ultrasound-guided aspiration and/or drainage catheter placement for infection source control purposes. EXAM: ULTRASOUND-GUIDED DRAINAGE CATHETER PLACEMENT INTO DOMINANT FLUID COLLECTION INVOLVING THE PROXIMAL LATERAL ASPECT THE LEFT THIGH COMPARISON:  Left hip CT-03/01/2020 MEDICATIONS: The patient is currently admitted to the hospital and receiving intravenous antibiotics. The antibiotics were administered within an appropriate time frame prior to the initiation of the procedure. ANESTHESIA/SEDATION: Moderate (conscious) sedation was employed during this procedure. A total of Versed 2 mg and Fentanyl 100 mcg was administered intravenously. Moderate Sedation Time: 18 minutes. The patient's level of consciousness and vital signs were monitored continuously by radiology nursing throughout the procedure under my direct supervision. CONTRAST:  None COMPLICATIONS: None immediate. PROCEDURE: Informed written consent was obtained from the patient after a discussion of the risks, benefits and alternatives to treatment. The patient was placed supine on his hospital bed with ultrasound imaging demonstrating a large (at least 7.6 x 1.6 cm complex fluid collection with the subcutaneous tissues involving the lateral aspect of the proximal left thigh (representative image  2). The procedure was planned. A timeout was performed prior to the initiation of the procedure. The skin overlying the lateral aspect the left thigh was prepped and draped in the usual sterile fashion. The overlying soft tissues were anesthetized with 1% lidocaine with epinephrine. Utilizing a caudal to cranial approach, an 18 gauge trocar  needle was advanced through near the entirety of the abscess/fluid collection within the left lateral thigh and a short Amplatz super stiff wire was coiled within the cranial component of the collection. Appropriate positioning was confirmed ultrasound imaging. The tract was serially dilated allowing placement of a 12 [place] all-purpose drainage catheter. Appropriate positioning was confirmed with a limited postprocedural CT scan. Next, approximately 50 cc of purulent fluid was aspirated. The tube was connected to a JP bulb and sutured in place. A dressing was placed. The patient tolerated the procedure well without immediate post procedural complication. IMPRESSION: Successful CT guided placement of a 7 French all purpose drain catheter into the complex fluid collection involving the proximal lateral aspect the left thigh with aspiration of 50 cc of purulent fluid. Samples were sent to the laboratory as requested by the ordering clinical team. Electronically Signed   By: Sandi Mariscal M.D.   On: 03/04/2020 15:43    Labs:  CBC: Recent Labs    03/01/20 0925 03/04/20 0537 03/05/20 0428 03/06/20 0325  WBC 10.1 8.2 7.4 9.9  HGB 8.6* 9.8* 10.3* 9.9*  HCT 26.3* 30.9* 32.1* 30.2*  PLT 414* 596* 601* 605*    COAGS: Recent Labs    03/04/20 1252  INR 1.1    BMP: Recent Labs    10/24/19 0807 10/24/19 0807 12/06/19 0736 12/06/19 0736 01/06/20 0751 01/06/20 0751 01/13/20 0716 01/14/20 1027 03/01/20 0925 03/04/20 0537 03/05/20 0428 03/06/20 0325  NA 135   < > 137   < > 136   < > 136   < > 133* 135 138 138  K 4.8   < > 5.4*   < > 5.3   < > 6.4*   < > 4.1 4.7 4.6 4.3  CL 109   < > 109   < > 108   < > 110   < > 106 103 105 106  CO2 18*   < > 21   < > 20   < > 19*   < > 18* 18* 18* 16*  GLUCOSE 145*   < > 152*   < > 107*   < > 58*   < > 209* 198* 186* 191*  BUN 33*   < > 43*   < > 55*   < > 52*   < > 59* 91* 117* 132*  CALCIUM 9.0   < > 9.0   < > 9.0   < > 8.9   < > 7.9* 8.6* 8.6* 8.4*   CREATININE 1.62*   < > 2.02*   < > 3.11*   < > 3.31*   < > 2.17* 2.20* 2.91* 2.70*  GFRNONAA 41*   < > 32*   < > 19*   < > 17*   < > 31* 31* 22* 24*  GFRAA 48*  --  37*  --  22*  --  20*  --   --   --   --   --    < > = values in this interval not displayed.    LIVER FUNCTION TESTS: Recent Labs    10/01/19 0803 12/06/19 0736 01/14/20 1027 01/15/20 0448  01/16/20 0434 01/17/20 0444 03/05/20 0428 03/06/20 0325  BILITOT  --  0.3 0.6  --   --   --  0.8 0.8  AST  --  9* 10*  --   --   --  14* 15  ALT  --  9 12  --   --   --  21 19  ALKPHOS  --   --  70  --   --   --  157* 143*  PROT  --  7.9 8.9*  --   --   --  8.4* 8.1  ALBUMIN   < >  --  4.0   < > 3.6 3.8 2.6* 2.6*   < > = values in this interval not displayed.    Assessment and Plan: Pt with septic arthritis left hip (prior hip replacement), assoc left thigh abscess, s/p perc drain placement 11/24; afebrile, WBC nl; hgb 9.9(10.3), creat 2.70; drain fluid cx- enterobacter; cont drain irrigation/antbx; tent plan for OR on 11/29 per ortho if cleared by medicine team   Electronically Signed: D. Rowe Robert, PA-C 03/06/2020, 12:57 PM   I spent a total of 15 minutes at the the patient's bedside AND on the patient's hospital floor or unit, greater than 50% of which was counseling/coordinating care for left thigh abscess drain    Patient ID: [name], male   DOB: 1945-07-05, 74 y.o.   MRN: [id number]

## 2020-03-06 NOTE — Progress Notes (Signed)
Subjective/Chief Complaint: Still with moderate NG output Denies abdominal pain Passing more flatus No BM   Objective: Vital signs in last 24 hours: Temp:  [97.8 F (36.6 C)-98.2 F (36.8 C)] 97.9 F (36.6 C) (11/26 0400) Pulse Rate:  [49-118] 107 (11/26 0700) Resp:  [13-20] 19 (11/26 0700) BP: (137-170)/(57-94) 139/62 (11/26 0700) SpO2:  [96 %-100 %] 97 % (11/26 0700) Last BM Date: 02/25/20  Intake/Output from previous day: 11/25 0701 - 11/26 0700 In: 660.5 [P.O.:120; I.V.:490.5; IV Piggyback:50] Out: 6270 [Urine:1175; Emesis/NG output:1250; Drains:15] Intake/Output this shift: No intake/output data recorded.  Exam: Awake and alert Abdomen remains distended, non-tender, small fascial defect at umbilicus without incarceration  Lab Results:  Recent Labs    03/05/20 0428 03/06/20 0325  WBC 7.4 9.9  HGB 10.3* 9.9*  HCT 32.1* 30.2*  PLT 601* 605*   BMET Recent Labs    03/05/20 0428 03/06/20 0325  NA 138 138  K 4.6 4.3  CL 105 106  CO2 18* 16*  GLUCOSE 186* 191*  BUN 117* 132*  CREATININE 2.91* 2.70*  CALCIUM 8.6* 8.4*   PT/INR Recent Labs    03/04/20 1252  LABPROT 13.9  INR 1.1   ABG No results for input(s): PHART, HCO3 in the last 72 hours.  Invalid input(s): PCO2, PO2  Studies/Results: CT ABDOMEN PELVIS WO CONTRAST  Result Date: 03/04/2020 CLINICAL DATA:  Nausea vomiting. EXAM: CT ABDOMEN AND PELVIS WITHOUT CONTRAST TECHNIQUE: Multidetector CT imaging of the abdomen and pelvis was performed following the standard protocol without IV contrast. COMPARISON:  CT abdomen pelvis 01/14/2020 FINDINGS: Lines and tubes: Enteric tube noted with tip within the gastric lumen and side port at the gastroesophageal junction. Bilateral ureteral stents with proximal pigtails in a superior pole calyx and distal pigtails not visualized due to streak artifact originating from bilateral femoral surgical hardware. Distal pigtails of the ureteral stents terminate in  the expected region of the urinary bladder. Lower chest: Coronary artery calcifications. Trace pericardial effusion. No acute abnormality. Hepatobiliary: No focal liver abnormality. No gallstones, gallbladder wall thickening, or pericholecystic fluid. No biliary dilatation. Pancreas: No focal lesion. Normal pancreatic contour. No surrounding inflammatory changes. No main pancreatic ductal dilatation. Spleen: Normal in size. There is a 1.6 cm fluid density lesion within the spleen likely represents a simple cyst. Otherwise no focal abnormality. Adrenals/Urinary Tract: No adrenal nodule bilaterally. Couple of tiny foci of gas noted within the collecting systems bilaterally. No nephrolithiasis, no hydronephrosis, and no contour-deforming renal mass. No ureterolithiasis or hydroureter. The urinary bladder is unremarkable. Stomach/Bowel: Stomach is within normal limits. The proximal mid small bowel is mildly distended with fluid of to 3.2 cm in caliber. No associated pneumatosis. There is suggestion of a possible transition point within the anterior abdomen at the level of the umbilicus. The distal small bowel is collapsed. No evidence of large bowel wall thickening or dilatation. Appendix appears normal. Vascular/Lymphatic: No abdominal aorta or iliac aneurysm. Severe atherosclerotic plaque of the aorta and its branches. No abdominal, pelvic, or inguinal lymphadenopathy. Reproductive: Not visualized due to streak artifact originating from bilateral its femoral surgical hardware. Other: No intraperitoneal free fluid. No intraperitoneal free gas. No organized fluid collection. Musculoskeletal: Tiny Richter hernia containing the anti mesenteric wall of a very short segment of small bowel (where the possible transition point is identified). No suspicious lytic or blastic osseous lesions. No acute displaced fracture. Multilevel degenerative changes of the spine. Partially visualized bilateral total hip arthroplasties.  IMPRESSION: 1. Tiny Richter hernia containing  a very short loop of small bowel with an associated early/partial small bowel obstruction. 2. Couple of tiny foci of gas noted within the collecting systems bilaterally. Findings could be related to recent ureteral stent placement versus could represent emphysematous pyelitis. Recommend correlation with urinalysis for urinary tract infection. 3. Enteric tube with tip within the gastric lumen and side port at the gastroesophageal junction. Consider advancing by 3-5cm. 4.  Aortic Atherosclerosis (ICD10-I70.0). Electronically Signed   By: Iven Finn M.D.   On: 03/04/2020 21:09   DG Abd 1 View  Result Date: 03/04/2020 CLINICAL DATA:  NG tube placement EXAM: ABDOMEN - 1 VIEW COMPARISON:  03/04/2020 FINDINGS: Esophageal tube tip projects over the mid gastric region. Dilated loops of small bowel suspicious for obstruction. Partially visualized ureteral stents IMPRESSION: Esophageal tube tip overlies the mid stomach. Electronically Signed   By: Donavan Foil M.D.   On: 03/04/2020 22:46   DG Abd Portable 1V-Small Bowel Obstruction Protocol-initial, 8 hr delay  Result Date: 03/05/2020 CLINICAL DATA:  SBO EXAM: PORTABLE ABDOMEN - 1 VIEW COMPARISON:  March 04, 2020 FINDINGS: Persistent gaseous dilation of loops of small bowel. Small bowel loop measures up to 5.2 cm, similar in comparison to prior. There is enteric contrast within a dilated loop of proximal small bowel. Enteric tube is coiled within the stomach with tip terminating over the distal stomach. Bilateral double-J stents. LEFT-sided surgical drain overlying the LEFT lateral leg. Soft tissue air of the LEFT lateral leg. Status post bilateral hip arthroplasty. Degenerative changes of the lower lumbar spine. IMPRESSION: Persistent gaseous dilation of loops of small bowel, similar in comparison to prior. Enteric contrast is within the dilated proximal small bowel. Electronically Signed   By: Valentino Saxon MD   On: 03/05/2020 11:12   [place] FNA SOFT TISSUE  Result Date: 03/04/2020 INDICATION: History of left total hip replacement, now with concern for infection. Please perform ultrasound-guided aspiration and/or drainage catheter placement for infection source control purposes. EXAM: ULTRASOUND-GUIDED DRAINAGE CATHETER PLACEMENT INTO DOMINANT FLUID COLLECTION INVOLVING THE PROXIMAL LATERAL ASPECT THE LEFT THIGH COMPARISON:  Left hip CT-03/01/2020 MEDICATIONS: The patient is currently admitted to the hospital and receiving intravenous antibiotics. The antibiotics were administered within an appropriate time frame prior to the initiation of the procedure. ANESTHESIA/SEDATION: Moderate (conscious) sedation was employed during this procedure. A total of Versed 2 mg and Fentanyl 100 mcg was administered intravenously. Moderate Sedation Time: 18 minutes. The patient's level of consciousness and vital signs were monitored continuously by radiology nursing throughout the procedure under my direct supervision. CONTRAST:  None COMPLICATIONS: None immediate. PROCEDURE: Informed written consent was obtained from the patient after a discussion of the risks, benefits and alternatives to treatment. The patient was placed supine on his hospital bed with ultrasound imaging demonstrating a large (at least 7.6 x 1.6 cm complex fluid collection with the subcutaneous tissues involving the lateral aspect of the proximal left thigh (representative image 2). The procedure was planned. A timeout was performed prior to the initiation of the procedure. The skin overlying the lateral aspect the left thigh was prepped and draped in the usual sterile fashion. The overlying soft tissues were anesthetized with 1% lidocaine with epinephrine. Utilizing a caudal to cranial approach, an 18 gauge trocar needle was advanced through near the entirety of the abscess/fluid collection within the left lateral thigh and a short Amplatz super stiff wire  was coiled within the cranial component of the collection. Appropriate positioning was confirmed ultrasound imaging. The tract was  serially dilated allowing placement of a 12 [place] all-purpose drainage catheter. Appropriate positioning was confirmed with a limited postprocedural CT scan. Next, approximately 50 cc of purulent fluid was aspirated. The tube was connected to a JP bulb and sutured in place. A dressing was placed. The patient tolerated the procedure well without immediate post procedural complication. IMPRESSION: Successful CT guided placement of a 6 French all purpose drain catheter into the complex fluid collection involving the proximal lateral aspect the left thigh with aspiration of 50 cc of purulent fluid. Samples were sent to the laboratory as requested by the ordering clinical team. Electronically Signed   By: Sandi Mariscal M.D.   On: 03/04/2020 15:43    Anti-infectives: Anti-infectives (From admission, onward)   Start     Dose/Rate Route Frequency Ordered Stop   03/05/20 1000  piperacillin-tazobactam (ZOSYN) IVPB 3.375 g        3.375 g 12.5 mL/hr over 240 Minutes Intravenous Every 8 hours 03/05/20 8453        Assessment/Plan: SBO vs ileus Septic arthritis of the hip  Creatinine better today Abdominal xray pending Continue NPO and NG Will need to consider PICC and TNA if he does not resolve soon   LOS: 3 days    Coralie Keens MD 03/06/2020

## 2020-03-06 NOTE — Plan of Care (Signed)
  Problem: Education: Goal: Knowledge of General Education information will improve Description: Including pain rating scale, medication(s)/side effects and non-pharmacologic comfort measures Outcome: Progressing   Problem: Clinical Measurements: Goal: Will remain free from infection Outcome: Progressing Goal: Diagnostic test results will improve Outcome: Progressing   Problem: Activity: Goal: Risk for activity intolerance will decrease Outcome: Progressing   Problem: Nutrition: Goal: Adequate nutrition will be maintained Outcome: Progressing   Problem: Pain Managment: Goal: General experience of comfort will improve Outcome: Progressing   Problem: Safety: Goal: Ability to remain free from injury will improve Outcome: Progressing   Problem: Skin Integrity: Goal: Risk for impaired skin integrity will decrease Outcome: Progressing

## 2020-03-06 NOTE — Consult Note (Signed)
Reason for Consult: Atrial fibrillation with moderate ventricular response Referring Physician: [facility] hospitalist  [name] is an 74 y.o. male.  HPI: Patient is 74 year old male with past medical history significant for coronary artery disease history of MI in remote past, hypertension, type 2 diabetes mellitus, chronic kidney disease stage IV, anemia of chronic disease, chronic atrial fibrillation chads vas score of 3 not on anticoagulants due to chronic anemia, history of left hip arthroplasty in remote past, history of nephrolithiasis status post stents, prostate surgery, was admitted on 1123 because of severe left hip pain and was noted to have septic arthritis.  Patient also complains of vague abdominal pain associated with nausea vomiting and was noted to have partial small bowel obstruction patient is being treated conservatively with low Gomco suction and broad-spectrum antibiotics.  Cardiology consultation is called because of A. fib with moderate ventricular response and frequent PVCs and couplets on the monitor.  Patient denies any chest pain or shortness of breath.  Patient is on heparin which he is tolerating it well hemoglobin has remained stable.  Patient had nuclear stress test approximately 3 years ago which showed no evidence of ischemia.  2D echo done in the past showed mild diastolic dysfunction with good LV systolic function.  Past Medical History:  Diagnosis Date  . Arthritis   . Asthma    no inhaler  . Coronary artery disease    cardiologist-  dr spruill; last visit 3 mos ago per pt  . GERD (gastroesophageal reflux disease)   . History of MI (myocardial infarction)    1985  . Hydronephrosis, left   . Hypertension   . Myocardial infarction ([place])   . Nephrolithiasis    left  . 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 Presence of tooth-root and mandibular implants    lower dental implants  . Prostate cancer ([place])   . Shortness of breath    WITH  EXERTION  . Type 2 diabetes mellitus ([place])     Past Surgical History:  Procedure Laterality Date  . BUNIONECTOMY    . CYSTOSCOPY W/ RETROGRADES Left 03/14/2014   Procedure: CYSTOSCOPY WITH LEFT RETROGRADE PYELOGRAM, Left Ureteroscopy, [place] ureteral Stent No string;  Surgeon: [name], MD;  Location: [facility];  Service: Urology;  Laterality: Left;  . CYSTOSCOPY W/ URETERAL STENT PLACEMENT Bilateral 01/15/2020   Procedure: CYSTOSCOPY WITH RETROGRADE PYELOGRAM/URETERAL STENT PLACEMENT;  Surgeon: Alexis Frock, MD;  Location: [facility];  Service: Urology;  Laterality: Bilateral;  . HERNIA REPAIR    . PROSTATE BIOPSY    . TOTAL HIP ARTHROPLASTY Left 03-20-2009  . TOTAL HIP ARTHROPLASTY  04/09/2012   Procedure: TOTAL HIP ARTHROPLASTY;  Surgeon: [name], MD;  Location: [facility];  Service: Orthopedics;  Laterality: Right;  . TOTAL KNEE ARTHROPLASTY Left 05-16-2008    Family History  Problem Relation Age of Onset  . Cancer Mother        breast  . Multiple sclerosis Daughter   . Cancer Father        prostate  . Cancer Maternal Uncle        bone    Social History:  reports that he quit smoking about 35 years ago. His smoking use included cigarettes. He has a 25.00 pack-year smoking history. He quit smokeless tobacco use about 34 years ago. He reports current alcohol use. He reports that he does not use drugs.  Allergies:  Allergies  Allergen Reactions  .  Shellfish Allergy Rash    Medications: I have reviewed the patient's current medications.  Results for orders placed or performed during the hospital encounter of 03/03/20 (from the past 48 hour(s))  Glucose, capillary     Status: Abnormal   Collection Time: 03/04/20 11:39 AM  Result Value Ref Range   Glucose-Capillary 213 (H) 70 - 99 mg/dL    Comment: Glucose reference range applies only to samples taken after fasting for at least 8 hours.  Protime-INR     Status: None   Collection Time: 03/04/20 12:52 PM   Result Value Ref Range   Prothrombin Time 13.9 11.4 - 15.2 seconds   INR 1.1 0.8 - 1.2    Comment: (NOTE) INR goal varies based on device and disease states. Performed at [facility], [place] [address], [place], [place] [address]   Aerobic/Anaerobic Culture (surgical/deep wound)     Status: None (Preliminary result)   Collection Time: 03/04/20  3:38 PM   Specimen: Abscess  Result Value Ref Range   Specimen Description      ABSCESS DRAIN LEFT LATERAL THIGH Performed at [facility] [address] [place], [place] [address]    Special Requests      NONE Performed at [facility], [place] [address], [place], [place] [address]    Gram Stain      ABUNDANT WBC PRESENT,BOTH PMN AND MONONUCLEAR ABUNDANT GRAM NEGATIVE RODS Performed at [facility], [place] [address] [place], [place] [address]    Culture ABUNDANT ENTEROBACTER CLOACAE    Report Status PENDING    Organism ID, Bacteria ENTEROBACTER CLOACAE       Susceptibility   Enterobacter cloacae - MIC*    CEFAZOLIN >=64 RESISTANT Resistant     CEFEPIME <=0.12 SENSITIVE Sensitive     CEFTAZIDIME <=1 SENSITIVE Sensitive     CIPROFLOXACIN <=0.25 SENSITIVE Sensitive     GENTAMICIN <=1 SENSITIVE Sensitive     IMIPENEM 0.5 SENSITIVE Sensitive     TRIMETH/SULFA <=20 SENSITIVE Sensitive     PIP/TAZO <=4 SENSITIVE Sensitive     * ABUNDANT ENTEROBACTER CLOACAE  Glucose, capillary     Status: Abnormal   Collection Time: 03/04/20  4:03 PM  Result Value Ref Range   Glucose-Capillary 172 (H) 70 - 99 mg/dL    Comment: Glucose reference range applies only to samples taken after fasting for at least 8 hours.  Glucose, capillary     Status: Abnormal   Collection Time: 03/04/20 10:10 PM  Result Value Ref Range   Glucose-Capillary 144 (H) 70 - 99 mg/dL    Comment: Glucose reference range applies only to samples taken after fasting for at least 8 hours.  Urinalysis, Routine w reflex  microscopic Urine, Clean Catch     Status: Abnormal   Collection Time: 03/05/20  1:00 AM  Result Value Ref Range   Color, Urine YELLOW YELLOW   APPearance TURBID (A) CLEAR   Specific Gravity, Urine 1.016 1.005 - 1.030   pH 5.0 5.0 - 8.0   Glucose, UA NEGATIVE NEGATIVE mg/dL   Hgb urine dipstick LARGE (A) NEGATIVE   Bilirubin Urine NEGATIVE NEGATIVE   Ketones, ur NEGATIVE NEGATIVE mg/dL   Protein, ur 100 (A) NEGATIVE mg/dL   Nitrite NEGATIVE NEGATIVE   Leukocytes,Ua MODERATE (A) NEGATIVE   RBC / HPF 11-20 0 - 5 RBC/hpf   WBC, UA 21-50 0 - 5 WBC/hpf   Bacteria, UA MANY (A) NONE SEEN   Squamous Epithelial /  LPF 0-5 0 - 5   Amorphous Crystal PRESENT     Comment: Performed at [facility], [place] [address], [place], [place] [address]  CBC with Differential/Platelet     Status: Abnormal   Collection Time: 03/05/20  4:28 AM  Result Value Ref Range   WBC 7.4 4.0 - 10.5 K/uL    Comment: WHITE COUNT CONFIRMED ON SMEAR   RBC 3.46 (L) 4.22 - 5.81 MIL/uL   Hemoglobin 10.3 (L) 13.0 - 17.0 g/dL   HCT 32.1 (L) 39 - 52 %   MCV 92.8 80.0 - 100.0 fL   MCH 29.8 26.0 - 34.0 pg   MCHC 32.1 30.0 - 36.0 g/dL   RDW 14.6 11.5 - 15.5 %   Platelets 601 (H) 150 - 400 K/uL   nRBC 0.0 0.0 - 0.2 %   Neutrophils Relative % 77 %   Neutro Abs 6.4 1.7 - 7.7 K/uL   Band Neutrophils 9 %   Lymphocytes Relative 7 %   Lymphs Abs 0.5 (L) 0.7 - 4.0 K/uL   Monocytes Relative 7 %   Monocytes Absolute 0.5 0.1 - 1.0 K/uL   Eosinophils Relative 0 %   Eosinophils Absolute 0.0 0.0 - 0.5 K/uL   Basophils Relative 0 %   Basophils Absolute 0.0 0.0 - 0.1 K/uL   WBC Morphology MILD LEFT SHIFT (1-5% METAS, OCC MYELO, OCC BANDS)    Abs Immature Granulocytes 0.00 0.00 - 0.07 K/uL    Comment: Performed at [facility], [place] [address], [place], [place] [address]  Comprehensive metabolic panel     Status: Abnormal   Collection Time: 03/05/20  4:28 AM  Result Value Ref Range   Sodium 138 135  - 145 mmol/L   Potassium 4.6 3.5 - 5.1 mmol/L   Chloride 105 98 - 111 mmol/L   CO2 18 (L) 22 - 32 mmol/L   Glucose, Bld 186 (H) 70 - 99 mg/dL    Comment: Glucose reference range applies only to samples taken after fasting for at least 8 hours.   BUN 117 (H) 8 - 23 mg/dL    Comment: RESULTS CONFIRMED BY MANUAL DILUTION   Creatinine, Ser 2.91 (H) 0.61 - 1.24 mg/dL   Calcium 8.6 (L) 8.9 - 10.3 mg/dL   Total Protein 8.4 (H) 6.5 - 8.1 g/dL   Albumin 2.6 (L) 3.5 - 5.0 g/dL   AST 14 (L) 15 - 41 U/L   ALT 21 0 - 44 U/L   Alkaline Phosphatase 157 (H) 38 - 126 U/L   Total Bilirubin 0.8 0.3 - 1.2 mg/dL   GFR, Estimated 22 (L) >60 mL/min    Comment: (NOTE) Calculated using the CKD-EPI Creatinine Equation (2021)    Anion gap 15 5 - 15    Comment: Performed at [facility], [place] [address], [place], [place] [address]  Magnesium     Status: Abnormal   Collection Time: 03/05/20  4:28 AM  Result Value Ref Range   Magnesium 3.4 (H) 1.7 - 2.4 mg/dL    Comment: Performed at [facility], [place] [address], [place],  [address]  Phosphorus     Status: Abnormal   Collection Time: 03/05/20  4:28 AM  Result Value Ref Range   Phosphorus 6.5 (H) 2.5 - 4.6 mg/dL    Comment: Performed at [facility], [place] [address], [place],  [address]  C-reactive protein     Status: Abnormal   Collection Time: 03/05/20  4:28 AM  Result Value Ref Range   CRP 23.3 (H) <1.0 mg/dL    Comment: Performed at [facility], [place] [address], [place], [place] [address]  Glucose, capillary     Status: Abnormal   Collection Time: 03/05/20  7:51 AM  Result Value Ref Range   Glucose-Capillary 154 (H) 70 - 99 mg/dL    Comment: Glucose reference range applies only to samples taken after fasting for at least 8 hours.  Culture, Urine     Status: Abnormal (Preliminary result)   Collection Time: 03/05/20  8:10 AM   Specimen: Urine, Random  Result Value  Ref Range   Specimen Description      URINE, RANDOM Performed at [facility] [address], [place], [place] [address]    Special Requests      NONE Performed at [facility], [place] [address] [place], [place] [address]    Culture (A)     >=100,000 COLONIES/mL GRAM NEGATIVE RODS SUSCEPTIBILITIES TO FOLLOW Performed at [facility], [place] [address], [place], [place] [address]    Report Status PENDING   Glucose, capillary     Status: Abnormal   Collection Time: 03/05/20 11:16 AM  Result Value Ref Range   Glucose-Capillary 154 (H) 70 - 99 mg/dL    Comment: Glucose reference range applies only to samples taken after fasting for at least 8 hours.  Glucose, capillary     Status: Abnormal   Collection Time: 03/05/20  5:08 PM  Result Value Ref Range   Glucose-Capillary 165 (H) 70 - 99 mg/dL    Comment: Glucose reference range applies only to samples taken after fasting for at least 8 hours.  Glucose, capillary     Status: Abnormal   Collection Time: 03/05/20  9:20 PM  Result Value Ref Range   Glucose-Capillary 164 (H) 70 - 99 mg/dL    Comment: Glucose reference range applies only to samples taken after fasting for at least 8 hours.  CBC with Differential/Platelet     Status: Abnormal   Collection Time: 03/06/20  3:25 AM  Result Value Ref Range   WBC 9.9 4.0 - 10.5 K/uL   RBC 3.20 (L) 4.22 - 5.81 MIL/uL   Hemoglobin 9.9 (L) 13.0 - 17.0 g/dL   HCT 30.2 (L) 39 - 52 %   MCV 94.4 80.0 - 100.0 fL   MCH 30.9 26.0 - 34.0 pg   MCHC 32.8 30.0 - 36.0 g/dL   RDW 14.8 11.5 - 15.5 %   Platelets 605 (H) 150 - 400 K/uL   nRBC 0.0 0.0 - 0.2 %   Neutrophils Relative % 85 %   Neutro Abs 8.3 (H) 1.7 - 7.7 K/uL   Lymphocytes Relative 6 %   Lymphs Abs 0.6 (L) 0.7 - 4.0 K/uL   Monocytes Relative 8 %   Monocytes Absolute 0.8 0.1 - 1.0 K/uL   Eosinophils Relative 1 %   Eosinophils Absolute 0.1 0.0 - 0.5 K/uL   Basophils Relative 0 %   Basophils Absolute  0.0 0.0 - 0.1 K/uL   Immature Granulocytes 0 %   Abs Immature Granulocytes 0.04 0.00 - 0.07 K/uL    Comment: Performed at [facility], [place] [address], [place], [place] [address]  Comprehensive metabolic panel     Status: Abnormal   Collection Time: 03/06/20  3:25 AM  Result Value Ref Range   Sodium 138 135 - 145 mmol/L   Potassium 4.3 3.5 -  5.1 mmol/L   Chloride 106 98 - 111 mmol/L   CO2 16 (L) 22 - 32 mmol/L   Glucose, Bld 191 (H) 70 - 99 mg/dL    Comment: Glucose reference range applies only to samples taken after fasting for at least 8 hours.   BUN 132 (H) 8 - 23 mg/dL    Comment: RESULTS CONFIRMED BY MANUAL DILUTION   Creatinine, Ser 2.70 (H) 0.61 - 1.24 mg/dL   Calcium 8.4 (L) 8.9 - 10.3 mg/dL   Total Protein 8.1 6.5 - 8.1 g/dL   Albumin 2.6 (L) 3.5 - 5.0 g/dL   AST 15 15 - 41 U/L   ALT 19 0 - 44 U/L   Alkaline Phosphatase 143 (H) 38 - 126 U/L   Total Bilirubin 0.8 0.3 - 1.2 mg/dL   GFR, Estimated 24 (L) >60 mL/min    Comment: (NOTE) Calculated using the CKD-EPI Creatinine Equation (2021)    Anion gap 16 (H) 5 - 15    Comment: Performed at [facility], [place] [address], [place], [place] [address]  Magnesium     Status: Abnormal   Collection Time: 03/06/20  3:25 AM  Result Value Ref Range   Magnesium 3.3 (H) 1.7 - 2.4 mg/dL    Comment: Performed at [facility], [place] [address] [place], [place] [address]  Phosphorus     Status: Abnormal   Collection Time: 03/06/20  3:25 AM  Result Value Ref Range   Phosphorus 6.3 (H) 2.5 - 4.6 mg/dL    Comment: Performed at [facility], [place] [address] [place], [place] [address]  Heparin level (unfractionated)     Status: Abnormal   Collection Time: 03/06/20  3:25 AM  Result Value Ref Range   Heparin Unfractionated 0.94 (H) 0.30 - 0.70 IU/mL    Comment: (NOTE) If heparin results are below expected values, and patient dosage has  been confirmed, suggest follow  up testing of antithrombin III levels. Performed at [facility], [place] [address] [place], [place] [address]   Glucose, capillary     Status: Abnormal   Collection Time: 03/06/20  7:25 AM  Result Value Ref Range   Glucose-Capillary 164 (H) 70 - 99 mg/dL    Comment: Glucose reference range applies only to samples taken after fasting for at least 8 hours.    CT ABDOMEN PELVIS WO CONTRAST  Result Date: 03/04/2020 CLINICAL DATA:  Nausea vomiting. EXAM: CT ABDOMEN AND PELVIS WITHOUT CONTRAST TECHNIQUE: Multidetector CT imaging of the abdomen and pelvis was performed following the standard protocol without IV contrast. COMPARISON:  CT abdomen pelvis 01/14/2020 FINDINGS: Lines and tubes: Enteric tube noted with tip within the gastric lumen and side port at the gastroesophageal junction. Bilateral ureteral stents with proximal pigtails in a superior pole calyx and distal pigtails not visualized due to streak artifact originating from bilateral femoral surgical hardware. Distal pigtails of the ureteral stents terminate in the expected region of the urinary bladder. Lower chest: Coronary artery calcifications. Trace pericardial effusion. No acute abnormality. Hepatobiliary: No focal liver abnormality. No gallstones, gallbladder wall thickening, or pericholecystic fluid. No biliary dilatation. Pancreas: No focal lesion. Normal pancreatic contour. No surrounding inflammatory changes. No main pancreatic ductal dilatation. Spleen: Normal in size. There is a 1.6 cm fluid density lesion within the spleen likely represents a simple cyst. Otherwise no focal abnormality. Adrenals/Urinary Tract: No adrenal nodule bilaterally. Couple of tiny foci of gas noted within the collecting systems bilaterally. No nephrolithiasis,  no hydronephrosis, and no contour-deforming renal mass. No ureterolithiasis or hydroureter. The urinary bladder is unremarkable. Stomach/Bowel: Stomach is within normal limits. The  proximal mid small bowel is mildly distended with fluid of to 3.2 cm in caliber. No associated pneumatosis. There is suggestion of a possible transition point within the anterior abdomen at the level of the umbilicus. The distal small bowel is collapsed. No evidence of large bowel wall thickening or dilatation. Appendix appears normal. Vascular/Lymphatic: No abdominal aorta or iliac aneurysm. Severe atherosclerotic plaque of the aorta and its branches. No abdominal, pelvic, or inguinal lymphadenopathy. Reproductive: Not visualized due to streak artifact originating from bilateral its femoral surgical hardware. Other: No intraperitoneal free fluid. No intraperitoneal free gas. No organized fluid collection. Musculoskeletal: Tiny Richter hernia containing the anti mesenteric wall of a very short segment of small bowel (where the possible transition point is identified). No suspicious lytic or blastic osseous lesions. No acute displaced fracture. Multilevel degenerative changes of the spine. Partially visualized bilateral total hip arthroplasties. IMPRESSION: 1. Tiny Richter hernia containing a very short loop of small bowel with an associated early/partial small bowel obstruction. 2. Couple of tiny foci of gas noted within the collecting systems bilaterally. Findings could be related to recent ureteral stent placement versus could represent emphysematous pyelitis. Recommend correlation with urinalysis for urinary tract infection. 3. Enteric tube with tip within the gastric lumen and side port at the gastroesophageal junction. Consider advancing by 3-5cm. 4.  Aortic Atherosclerosis (ICD10-I70.0). Electronically Signed   By: Iven Finn M.D.   On: 03/04/2020 21:09   DG Abd 1 View  Result Date: 03/04/2020 CLINICAL DATA:  NG tube placement EXAM: ABDOMEN - 1 VIEW COMPARISON:  03/04/2020 FINDINGS: Esophageal tube tip projects over the mid gastric region. Dilated loops of small bowel suspicious for obstruction.  Partially visualized ureteral stents IMPRESSION: Esophageal tube tip overlies the mid stomach. Electronically Signed   By: Donavan Foil M.D.   On: 03/04/2020 22:46   DG Abd Portable 1V  Result Date: 03/06/2020 CLINICAL DATA:  Small-bowel obstruction. EXAM: PORTABLE ABDOMEN - 1 VIEW COMPARISON:  03/05/2020 abdominal radiographs and prior. FINDINGS: Indwelling enteric tube with tip and side hole overlying the gastric body. Increased conspicuity of gas-filled mildly dilated small bowel loops. Residual enteric contrast opacifies nondilated ascending colon. Bilateral double-J ureteral stents are unchanged. Sequela of bilateral hip arthroplasties. Partially imaged left hip surgical drain. IMPRESSION: Mildly dilated small bowel loops, more conspicuous than prior exam. Indwelling enteric tube and bilateral ureteral stents, unchanged. Electronically Signed   By: Primitivo Gauze M.D.   On: 03/06/2020 10:02   DG Abd Portable 1V-Small Bowel Obstruction Protocol-initial, 8 hr delay  Result Date: 03/05/2020 CLINICAL DATA:  SBO EXAM: PORTABLE ABDOMEN - 1 VIEW COMPARISON:  March 04, 2020 FINDINGS: Persistent gaseous dilation of loops of small bowel. Small bowel loop measures up to 5.2 cm, similar in comparison to prior. There is enteric contrast within a dilated loop of proximal small bowel. Enteric tube is coiled within the stomach with tip terminating over the distal stomach. Bilateral double-J stents. LEFT-sided surgical drain overlying the LEFT lateral leg. Soft tissue air of the LEFT lateral leg. Status post bilateral hip arthroplasty. Degenerative changes of the lower lumbar spine. IMPRESSION: Persistent gaseous dilation of loops of small bowel, similar in comparison to prior. Enteric contrast is within the dilated proximal small bowel. Electronically Signed   By: Valentino Saxon MD   On: 03/05/2020 11:12   [place] FNA SOFT TISSUE  Result Date:  03/04/2020 INDICATION: History of left total hip replacement,  now with concern for infection. Please perform ultrasound-guided aspiration and/or drainage catheter placement for infection source control purposes. EXAM: ULTRASOUND-GUIDED DRAINAGE CATHETER PLACEMENT INTO DOMINANT FLUID COLLECTION INVOLVING THE PROXIMAL LATERAL ASPECT THE LEFT THIGH COMPARISON:  Left hip CT-03/01/2020 MEDICATIONS: The patient is currently admitted to the hospital and receiving intravenous antibiotics. The antibiotics were administered within an appropriate time frame prior to the initiation of the procedure. ANESTHESIA/SEDATION: Moderate (conscious) sedation was employed during this procedure. A total of Versed 2 mg and Fentanyl 100 mcg was administered intravenously. Moderate Sedation Time: 18 minutes. The patient's level of consciousness and vital signs were monitored continuously by radiology nursing throughout the procedure under my direct supervision. CONTRAST:  None COMPLICATIONS: None immediate. PROCEDURE: Informed written consent was obtained from the patient after a discussion of the risks, benefits and alternatives to treatment. The patient was placed supine on his hospital bed with ultrasound imaging demonstrating a large (at least 7.6 x 1.6 cm complex fluid collection with the subcutaneous tissues involving the lateral aspect of the proximal left thigh (representative image 2). The procedure was planned. A timeout was performed prior to the initiation of the procedure. The skin overlying the lateral aspect the left thigh was prepped and draped in the usual sterile fashion. The overlying soft tissues were anesthetized with 1% lidocaine with epinephrine. Utilizing a caudal to cranial approach, an 18 gauge trocar needle was advanced through near the entirety of the abscess/fluid collection within the left lateral thigh and a short Amplatz super stiff wire was coiled within the cranial component of the collection. Appropriate positioning was confirmed ultrasound imaging. The tract was  serially dilated allowing placement of a 12 [place] all-purpose drainage catheter. Appropriate positioning was confirmed with a limited postprocedural CT scan. Next, approximately 50 cc of purulent fluid was aspirated. The tube was connected to a JP bulb and sutured in place. A dressing was placed. The patient tolerated the procedure well without immediate post procedural complication. IMPRESSION: Successful CT guided placement of a 47 French all purpose drain catheter into the complex fluid collection involving the proximal lateral aspect the left thigh with aspiration of 50 cc of purulent fluid. Samples were sent to the laboratory as requested by the ordering clinical team. Electronically Signed   By: Sandi Mariscal M.D.   On: 03/04/2020 15:43    Review of Systems  Constitutional: Negative for diaphoresis and fever.  HENT: Negative for sore throat.   Eyes: Negative for discharge.  Respiratory: Negative for cough and shortness of breath.   Cardiovascular: Negative for chest pain, palpitations and leg swelling.  Gastrointestinal: Positive for abdominal pain. Negative for blood in stool.  Genitourinary: Negative for difficulty urinating.  Musculoskeletal: Positive for arthralgias and gait problem.  Neurological: Negative for dizziness.   Blood pressure (!) 147/82, pulse (!) 102, temperature 97.9 F (36.6 C), temperature source Oral, resp. rate 15, height 6\' 2"  (1.88 m), weight 110.3 kg, SpO2 96 %. Physical Exam Constitutional:      Appearance: Normal appearance.  HENT:     Head: Normocephalic and atraumatic.  Eyes:     Extraocular Movements: Extraocular movements intact.     Pupils: Pupils are equal, round, and reactive to light.  Neck:     Vascular: No carotid bruit.  Cardiovascular:     Rate and Rhythm: Tachycardia present. Rhythm irregular.     Heart sounds: Murmur (Soft systolic murmur noted no S3 gallop) heard.   Pulmonary:  Effort: Pulmonary effort is normal.     Breath sounds:  Normal breath sounds.  Abdominal:     General: Bowel sounds are normal.     Palpations: Abdomen is soft.  Musculoskeletal:        General: No swelling or tenderness.     Cervical back: Normal range of motion and neck supple. No tenderness.  Skin:    General: Skin is warm and dry.  Neurological:     General: No focal deficit present.     Mental Status: He is alert and oriented to person, place, and time.     Assessment/Plan: A. fib flutter with moderate ventricular response chads Vascor of 3 on chronic anticoagulation now Coronary artery disease history of MI in remote past stable Left hip septic arthritis Hypertension Diabetes mellitus Hyperlipidemia Degenerative joint disease History of carcinoma of the prostate History of nephrolithiasis status post bilateral ureteric stents Chronic kidney disease stage IV Anemia of chronic disease Resolving small bowel obstruction Plan Continue present management Add low-dose beta-blockers as per orders Check 2D echo Dr. Doylene Canard on call for me for weekend  Charolette Forward 03/06/2020, 10:46 AM

## 2020-03-06 NOTE — Progress Notes (Addendum)
PROGRESS NOTE    [name]  NWG:[id number] DOB: 11-12-1945 DOA: 03/03/2020 PCP: Luetta Nutting, DO   No chief complaint on file.  Brief Narrative:  [name] is [name] 74 y.o. male with medical history significant of coronary artery disease, diabetes type 2, hypertension, anemia, CKD stage IIIb, osteoarthritis status post left hip arthroplasty, renal stone/hydronephrosis status post stents, prostate cancer ,paroxysmal atrial flutter not on anticoagulation who was sent for direct admission to [facility] from [facility],Dr Aluisio.  Patient was seen in the emergency department on 03/01/2020 for left hip pain.  There was no report of injury.  He had CT left hip done which showed  fluid collection.  He was found to have elevated ESR and CRP.  He was discharged from the emergency department to follow-up with orthopedics as per orthopedics recommendation.   In the orthopedics office, he reported chills, nausea/diarrhea and increased left hip pain.  He was hardly able to walk.  He was afebrile.  Patient was requested for direct admission by orthopedics team for drainage or intervention of the left hip fluid collection.  Lab work also showed elevated ESR and CRP. Patient seen and examined at the bedside this evening.  During my evaluation, he was hemodynamically stable and overall comfortable.  He was hypertensive.  He complained of severe pain on his left hip and the left ear was found to be tender.  He denied any fever, chest pain, shortness of breath, cough, abdomen pain, nausea, vomiting, dysuria, hematochezia or melena.  He ives alone.  Assessment & Plan:   Active Problems:   OA (osteoarthritis) of hip   Type 2 diabetes mellitus with hypoglycemia without coma (HCC)   Essential hypertension   Hyperlipidemia with target LDL less than 70   Hip pain   Effusion of hip joint, left   Abdominal distension  Enterobacter Cloacae Bacteremia  Emphysematous Pyelitis  Left  Hip Septic Arthritis/Prosthetic Joint Infection CT left hip showed large fluid and air containing collection surroudning the proximal femoral component measuring up to 8.7 cm, concerning for infection/abscess.  Fluid and air collection track within the L adductor muscle compartment extending towards the symphysis.  Periprosthetic lucency along the posterior cortex of the proximal femoral stem extending distally by approximately 7.5 cm, concerning for septic loosening.  Area of lucency along the anterior aspect of the proximal native femur with air seen within bone, concerning for osteo. Orthopedic c/s, appreciate recs S/p CT guided placed 10 F drainage catheter placement  Culture with abundant enterobacter cloacae (resistant to cefazolin) Blood culture with 1 of 2 sets positive for enterobacter cloacae on BCID, pending final cx results Urine cx with >100,000 gram negative rods -> pending final cx Ortho planning for surgery on Monday ID consult, appreciate recs -> recommending transition to cefepime, follow cultures, discuss with urology management of stents in light of bacteremia Discussed with urology who will comment in chart  Partial SBO: s/p NG tube placement for diffusely gas distended bowel Follow CT abdomen pelvis -> tiny richter hernia containing [name] very short loop of small bowel with an associated early/partial SBO. KUB 11/26 with mildly dilated small bowel loops Surgery c/s, appreciate recs - consider PICC and TNA if he doesn't resolve soon Continue NGT to LIS NPO for now  Paroxysmal Emeterio [name]. fib/atrial flutter: Follows with Dr [name].  Not on any anticoagulation at home because of anemia.  Was on anticoagulation in the past - will look into this, consider starting anticoagulation - discussed  with pt.  On Coreg for rate control (on hold while NPO).  IV metoprolol q6.  Some intermittent RVR chadvasc at leat 3. Appreciate cardiology assistance Transition to dilt gtt, use metop prn  Follow  repeat echo - EF 55-60%, no RWMA (see report)  Diabetes type 2:Hemoglobin A1c of 7.1 as per 9/21. SSI - hold home amaryl and januvia Adjust prn  AKI on CKD stage IIIb  Anion Gap Metabolic Acidosis: His kidney function has fluctuated over several months from 1.5-3, unclear baseline.  Continue to monitor kidney function.  Avoid nephrotoxins.  He needs to follow-up with nephrology as an outpatient. Creatinine up to 2.91 on 11/25, follow with IVF (improving today) Transition to bicarb gtt, suspect AGMA 2/2 AKI on CKD increase rate to keep up with NG losses and UOP. continue to monitor  History of severe left hydroureteronephrosis/prostate cancer: Follows with urology.  He is status post bilateral ureteral stent placement by Dr. [name] 10/6.  Currently prostate cancer is in remission.  Normocytic anemia: Most likely associated  with chronic kidney disease, chronic medical problems.  Hemoglobin currently stable .  No report of hematochezia or melena.  Coronary artery disease: Takes aspirin.  Currently held for tomorrow's procedure. Denies any chest pain.    Hypertension:  Oral meds on hold with above.  Prn labetalol.  Hyperlipidemia: Takes Lipitor.  GERD: Continue Protonix  Left hip pain/debility: We will request for PT/OT evaluation after procedure.  DVT prophylaxis: heparin Code Status: full  Family Communication: none at bedside Disposition:   Status is: inpatient  The patient will require care spanning > 2 midnights and should be moved to inpatient because: Inpatient level of care appropriate due to severity of illness  Dispo: The patient is from: Home              Anticipated d/c is to: pending              Anticipated d/c date is: > 3 days              Patient currently is not medically stable to d/c.   Consultants:   IR  Ortho  Procedures: IR IMPRESSION: Successful CT guided placement of Kirstin Kugler 12 French all purpose drain catheter into the complex fluid  collection involving the proximal lateral aspect the left thigh with aspiration of 50 cc of purulent fluid. Samples were sent to the laboratory as requested by the ordering clinical team.  Echo IMPRESSIONS    1. Left ventricular ejection fraction, by estimation, is 55 to 60%. The  left ventricle has normal function. The left ventricle has no regional  wall motion abnormalities. There is mild concentric left ventricular  hypertrophy. Left ventricular diastolic  function could not be evaluated.  2. Right ventricular systolic function is normal. The right ventricular  size is normal.  3. Left atrial size was mildly dilated.  4. The pericardial effusion is circumferential.  5. The mitral valve is normal in structure. Mild mitral valve  regurgitation. No evidence of mitral stenosis.  6. The aortic valve is normal in structure. Aortic valve regurgitation is  not visualized. Mild aortic valve sclerosis is present, with no evidence  of aortic valve stenosis.  7. The inferior vena cava is normal in size with greater than 50%  respiratory variability, suggesting right atrial pressure of 3 mmHg.   Comparison(s): No significant change from prior study. Prior images  reviewed side by side.  Antimicrobials: Anti-infectives (From admission, onward)   Start  Dose/Rate Route Frequency Ordered Stop   03/06/20 1700  ceFEPIme (MAXIPIME) 2 g in sodium chloride 0.9 % 100 mL IVPB        2 g 200 mL/hr over 30 Minutes Intravenous Every 12 hours 03/06/20 1340     03/05/20 1000  piperacillin-tazobactam (ZOSYN) IVPB 3.375 g  Status:  Discontinued        3.375 g 12.5 mL/hr over 240 Minutes Intravenous Every 8 hours 03/05/20 0904 03/06/20 1311      Subjective: Asking for something to drink  No new complaints  Objective: Vitals:   03/06/20 0600 03/06/20 0700 03/06/20 0900 03/06/20 1200  BP: (!) 137/57 139/62 (!) 147/82 (!) 171/78  Pulse: 62 (!) 107 (!) 102 100  Resp: '15 19 15 19  ' Temp:       TempSrc:      SpO2: 96% 97% 96% 94%  Weight:      Height:        Intake/Output Summary (Last 24 hours) at 03/06/2020 1717 Last data filed at 03/06/2020 1500 Gross per 24 hour  Intake 600.52 ml  Output 2690 ml  Net -2089.48 ml   Filed Weights   03/03/20 1855  Weight: 110.3 kg    Examination:  General: No acute distress. Cardiovascular: irregularly irregular Lungs: Clear to auscultation bilaterally Abdomen: distended, nontender Neurological: Alert and oriented 3. Moves all extremities 4. Cranial nerves II through XII grossly intact. Skin: Warm and dry. No rashes or lesions. Extremities: LLE edema, drain in place   Data Reviewed: I have personally reviewed following labs and imaging studies  CBC: Recent Labs  Lab 03/01/20 0925 03/04/20 0537 03/05/20 0428 03/06/20 0325  WBC 10.1 8.2 7.4 9.9  NEUTROABS 8.5*  --  6.4 8.3*  HGB 8.6* 9.8* 10.3* 9.9*  HCT 26.3* 30.9* 32.1* 30.2*  MCV 92.9 92.8 92.8 94.4  PLT 414* 596* 601* 605*    Basic Metabolic Panel: Recent Labs  Lab 03/01/20 0925 03/04/20 0537 03/05/20 0428 03/06/20 0325  NA 133* 135 138 138  K 4.1 4.7 4.6 4.3  CL 106 103 105 106  CO2 18* 18* 18* 16*  GLUCOSE 209* 198* 186* 191*  BUN 59* 91* 117* 132*  CREATININE 2.17* 2.20* 2.91* 2.70*  CALCIUM 7.9* 8.6* 8.6* 8.4*  MG  --   --  3.4* 3.3*  PHOS  --   --  6.5* 6.3*    GFR: Estimated Creatinine Clearance: 31.7 mL/min (Denson Niccoli) (by C-G formula based on SCr of 2.7 mg/dL (H)).  Liver Function Tests: Recent Labs  Lab 03/05/20 0428 03/06/20 0325  AST 14* 15  ALT 21 19  ALKPHOS 157* 143*  BILITOT 0.8 0.8  PROT 8.4* 8.1  ALBUMIN 2.6* 2.6*    CBG: Recent Labs  Lab 03/05/20 1116 03/05/20 1708 03/05/20 2120 03/06/20 0725 03/06/20 1154  GLUCAP 154* 165* 164* 164* 182*     Recent Results (from the past 240 hour(s))  Resp Panel by RT-PCR (Flu Cheyrl Buley&B, Covid) Nasopharyngeal Swab     Status: None   Collection Time: 03/01/20 12:20 PM   Specimen:  Nasopharyngeal Swab; Nasopharyngeal(NP) swabs in vial transport medium  Result Value Ref Range Status   SARS Coronavirus 2 by RT PCR NEGATIVE NEGATIVE Final    Comment: (NOTE) SARS-CoV-2 target nucleic acids are NOT DETECTED.  The SARS-CoV-2 RNA is generally detectable in upper respiratory specimens during the acute phase of infection. The lowest concentration of SARS-CoV-2 viral copies this assay can detect is 138 copies/mL. Jd Mccaster negative result does not  preclude SARS-Cov-2 infection and should not be used as the sole basis for treatment or other patient management decisions. Takyia Sindt negative result may occur with  improper specimen collection/handling, submission of specimen other than nasopharyngeal swab, presence of viral mutation(s) within the areas targeted by this assay, and inadequate number of viral copies(<138 copies/mL). Krista Godsil negative result must be combined with clinical observations, patient history, and epidemiological information. The expected result is Negative.  Fact Sheet for Patients:  EntrepreneurPulse.com.au  Fact Sheet for Healthcare Providers:  IncredibleEmployment.be  This test is no t yet approved or cleared by the [place] FDA and  has been authorized for detection and/or diagnosis of SARS-CoV-2 by FDA under an Emergency Use Authorization (EUA). This EUA will remain  in effect (meaning this test can be used) for the duration of the COVID-19 declaration under Section 564(b)(1) of the Act, 21 U.S.C.section 360bbb-3(b)(1), unless the authorization is terminated  or revoked sooner.       Influenza Jerson Furukawa by PCR NEGATIVE NEGATIVE Final   Influenza B by PCR NEGATIVE NEGATIVE Final    Comment: (NOTE) The Xpert Xpress SARS-CoV-2/FLU/RSV plus assay is intended as an aid in the diagnosis of influenza from Nasopharyngeal swab specimens and should not be used as Lavetta Geier sole basis for treatment. Nasal washings and aspirates are unacceptable for  Xpert Xpress SARS-CoV-2/FLU/RSV testing.  Fact Sheet for Patients: EntrepreneurPulse.com.au  Fact Sheet for Healthcare Providers: IncredibleEmployment.be  This test is not yet approved or cleared by the [place] FDA and has been authorized for detection and/or diagnosis of SARS-CoV-2 by FDA under an Emergency Use Authorization (EUA). This EUA will remain in effect (meaning this test can be used) for the duration of the COVID-19 declaration under Section 564(b)(1) of the Act, 21 U.S.C. section 360bbb-3(b)(1), unless the authorization is terminated or revoked.  Performed at [facility], [place] [address], [place], [place] [address]   Culture, blood (routine x 2)     Status: None (Preliminary result)   Collection Time: 03/03/20  7:04 PM   Specimen: BLOOD LEFT HAND  Result Value Ref Range Status   Specimen Description   Final    BLOOD LEFT HAND Performed at [place] [address] [place], [place] [address]    Special Requests   Final    BOTTLES DRAWN AEROBIC ONLY Blood Culture adequate volume Performed at [facility] [address] [place], [place] [address]    Culture  Setup Time   Final    AEROBIC BOTTLE ONLY GRAM NEGATIVE RODS Organism ID to follow CRITICAL RESULT CALLED TO, READ BACK BY AND VERIFIED WITH: Ulice Bold PharmD 9:20 03/06/20 ([email or web address])    Culture   Final    NO GROWTH 3 DAYS Performed at [facility], [place] [address], [place], [place] [address]    Report Status PENDING  Incomplete  Blood Culture ID Panel (Reflexed)     Status: Abnormal   Collection Time: 03/03/20  7:04 PM  Result Value Ref Range Status   Enterococcus faecalis NOT DETECTED NOT DETECTED Final   Enterococcus Faecium NOT DETECTED NOT DETECTED Final   Listeria monocytogenes NOT DETECTED NOT DETECTED Final   Staphylococcus species NOT DETECTED NOT DETECTED Final   Staphylococcus aureus  (BCID) NOT DETECTED NOT DETECTED Final   Staphylococcus epidermidis NOT DETECTED NOT DETECTED Final   Staphylococcus lugdunensis NOT DETECTED NOT DETECTED Final   Streptococcus species NOT DETECTED NOT DETECTED Final   Streptococcus agalactiae NOT DETECTED NOT DETECTED  Final   Streptococcus pneumoniae NOT DETECTED NOT DETECTED Final   Streptococcus pyogenes NOT DETECTED NOT DETECTED Final   Eleonore Shippee.calcoaceticus-baumannii NOT DETECTED NOT DETECTED Final   Bacteroides fragilis NOT DETECTED NOT DETECTED Final   Enterobacterales DETECTED (Austina Constantin) NOT DETECTED Final    Comment: Enterobacterales represent Desta Bujak large order of gram negative bacteria, not Xian Apostol single organism. CRITICAL RESULT CALLED TO, READ BACK BY AND VERIFIED WITH: Ulice Bold PharmD 9:20 03/06/20 ([email or web address])    Enterobacter cloacae complex DETECTED (Kaylean Tupou) NOT DETECTED Final    Comment: CRITICAL RESULT CALLED TO, READ BACK BY AND VERIFIED WITH: Ulice Bold PharmD 9:20 03/06/20 ([email or web address])    Escherichia coli NOT DETECTED NOT DETECTED Final   Klebsiella aerogenes NOT DETECTED NOT DETECTED Final   Klebsiella oxytoca NOT DETECTED NOT DETECTED Final   Klebsiella pneumoniae NOT DETECTED NOT DETECTED Final   Proteus species NOT DETECTED NOT DETECTED Final   Salmonella species NOT DETECTED NOT DETECTED Final   Serratia marcescens NOT DETECTED NOT DETECTED Final   Haemophilus influenzae NOT DETECTED NOT DETECTED Final   Neisseria meningitidis NOT DETECTED NOT DETECTED Final   Pseudomonas aeruginosa NOT DETECTED NOT DETECTED Final   Stenotrophomonas maltophilia NOT DETECTED NOT DETECTED Final   Candida albicans NOT DETECTED NOT DETECTED Final   Candida auris NOT DETECTED NOT DETECTED Final   Candida glabrata NOT DETECTED NOT DETECTED Final   Candida krusei NOT DETECTED NOT DETECTED Final   Candida parapsilosis NOT DETECTED NOT DETECTED Final   Candida tropicalis NOT DETECTED NOT DETECTED Final   Cryptococcus neoformans/gattii NOT DETECTED NOT DETECTED Final     CTX-M ESBL NOT DETECTED NOT DETECTED Final   Carbapenem resistance IMP NOT DETECTED NOT DETECTED Final   Carbapenem resistance KPC NOT DETECTED NOT DETECTED Final   Carbapenem resistance NDM NOT DETECTED NOT DETECTED Final   Carbapenem resist OXA 48 LIKE NOT DETECTED NOT DETECTED Final   Carbapenem resistance VIM NOT DETECTED NOT DETECTED Final    Comment: Performed at [facility], [address], [place], [place] [address]  Culture, blood (routine x 2)     Status: None (Preliminary result)   Collection Time: 03/03/20  7:06 PM   Specimen: BLOOD RIGHT HAND  Result Value Ref Range Status   Specimen Description   Final    BLOOD RIGHT HAND Performed at [facility] [address], [place], [place] [address]    Special Requests   Final    BOTTLES DRAWN AEROBIC ONLY Blood Culture adequate volume Performed at [facility] [address], [place], [place] [address]    Culture   Final    NO GROWTH 3 DAYS Performed at [facility], [place] [address] [place], [place] [address]    Report Status PENDING  Incomplete  Aerobic/Anaerobic Culture (surgical/deep wound)     Status: None (Preliminary result)   Collection Time: 03/04/20  3:38 PM   Specimen: Abscess  Result Value Ref Range Status   Specimen Description   Final    ABSCESS DRAIN LEFT LATERAL THIGH Performed at [facility] [address] [place], [place] [address]    Special Requests   Final    NONE Performed at [facility], [place] [address] [place], [place] [address]    Gram Stain   Final    ABUNDANT WBC PRESENT,BOTH PMN AND MONONUCLEAR ABUNDANT GRAM NEGATIVE RODS Performed at [facility], [place] [address], [place], [place] [address]    Culture  Final    ABUNDANT ENTEROBACTER CLOACAE NO ANAEROBES ISOLATED; CULTURE IN PROGRESS FOR 5 DAYS    Report Status PENDING  Incomplete   Organism ID, Bacteria ENTEROBACTER CLOACAE  Final       Susceptibility   Enterobacter cloacae - MIC*    CEFAZOLIN >=64 RESISTANT Resistant     CEFEPIME <=0.12 SENSITIVE Sensitive     CEFTAZIDIME <=1 SENSITIVE Sensitive     CIPROFLOXACIN <=0.25 SENSITIVE Sensitive     GENTAMICIN <=1 SENSITIVE Sensitive     IMIPENEM 0.5 SENSITIVE Sensitive     TRIMETH/SULFA <=20 SENSITIVE Sensitive     PIP/TAZO <=4 SENSITIVE Sensitive     * ABUNDANT ENTEROBACTER CLOACAE  Culture, Urine     Status: Abnormal (Preliminary result)   Collection Time: 03/05/20  8:10 AM   Specimen: Urine, Random  Result Value Ref Range Status   Specimen Description   Final    URINE, RANDOM Performed at [facility], [place] [address] [place], [place] [address]    Special Requests   Final    NONE Performed at [facility], [place] [address] [place], [place] [address]    Culture (Maytal Mijangos)  Final    >=100,000 COLONIES/mL GRAM NEGATIVE RODS SUSCEPTIBILITIES TO FOLLOW Performed at [facility], [place] [address], [place], [place] [address]    Report Status PENDING  Incomplete         Radiology Studies: CT ABDOMEN PELVIS WO CONTRAST  Result Date: 03/04/2020 CLINICAL DATA:  Nausea vomiting. EXAM: CT ABDOMEN AND PELVIS WITHOUT CONTRAST TECHNIQUE: Multidetector CT imaging of the abdomen and pelvis was performed following the standard protocol without IV contrast. COMPARISON:  CT abdomen pelvis 01/14/2020 FINDINGS: Lines and tubes: Enteric tube noted with tip within the gastric lumen and side port at the gastroesophageal junction. Bilateral ureteral stents with proximal pigtails in Carmel Garfield superior pole calyx and distal pigtails not visualized due to streak artifact originating from bilateral femoral surgical hardware. Distal pigtails of the ureteral stents terminate in the expected region of the urinary bladder. Lower chest: Coronary artery calcifications. Trace pericardial effusion. No acute abnormality. Hepatobiliary: No focal liver abnormality. No  gallstones, gallbladder wall thickening, or pericholecystic fluid. No biliary dilatation. Pancreas: No focal lesion. Normal pancreatic contour. No surrounding inflammatory changes. No main pancreatic ductal dilatation. Spleen: Normal in size. There is Sadiel Mota 1.6 cm fluid density lesion within the spleen likely represents Roosevelt Bisher simple cyst. Otherwise no focal abnormality. Adrenals/Urinary Tract: No adrenal nodule bilaterally. Couple of tiny foci of gas noted within the collecting systems bilaterally. No nephrolithiasis, no hydronephrosis, and no contour-deforming renal mass. No ureterolithiasis or hydroureter. The urinary bladder is unremarkable. Stomach/Bowel: Stomach is within normal limits. The proximal mid small bowel is mildly distended with fluid of to 3.2 cm in caliber. No associated pneumatosis. There is suggestion of Mairim Bade possible transition point within the anterior abdomen at the level of the umbilicus. The distal small bowel is collapsed. No evidence of large bowel wall thickening or dilatation. Appendix appears normal. Vascular/Lymphatic: No abdominal aorta or iliac aneurysm. Severe atherosclerotic plaque of the aorta and its branches. No abdominal, pelvic, or inguinal lymphadenopathy. Reproductive: Not visualized due to streak artifact originating from bilateral its femoral surgical hardware. Other: No intraperitoneal free fluid. No intraperitoneal free gas. No organized fluid collection. Musculoskeletal: Tiny Richter hernia containing the anti mesenteric wall of Kiev Labrosse very short segment of small bowel (where the possible transition point is identified). No suspicious lytic or blastic osseous lesions. No acute displaced  fracture. Multilevel degenerative changes of the spine. Partially visualized bilateral total hip arthroplasties. IMPRESSION: 1. Tiny Richter hernia containing Meliyah Simon very short loop of small bowel with an associated early/partial small bowel obstruction. 2. Couple of tiny foci of gas noted within the  collecting systems bilaterally. Findings could be related to recent ureteral stent placement versus could represent emphysematous pyelitis. Recommend correlation with urinalysis for urinary tract infection. 3. Enteric tube with tip within the gastric lumen and side port at the gastroesophageal junction. Consider advancing by 3-5cm. 4.  Aortic Atherosclerosis (ICD10-I70.0). Electronically Signed   By: Iven Finn M.D.   On: 03/04/2020 21:09   DG Abd 1 View  Result Date: 03/04/2020 CLINICAL DATA:  NG tube placement EXAM: ABDOMEN - 1 VIEW COMPARISON:  03/04/2020 FINDINGS: Esophageal tube tip projects over the mid gastric region. Dilated loops of small bowel suspicious for obstruction. Partially visualized ureteral stents IMPRESSION: Esophageal tube tip overlies the mid stomach. Electronically Signed   By: Donavan Foil M.D.   On: 03/04/2020 22:46   DG Abd Portable 1V  Result Date: 03/06/2020 CLINICAL DATA:  Small-bowel obstruction. EXAM: PORTABLE ABDOMEN - 1 VIEW COMPARISON:  03/05/2020 abdominal radiographs and prior. FINDINGS: Indwelling enteric tube with tip and side hole overlying the gastric body. Increased conspicuity of gas-filled mildly dilated small bowel loops. Residual enteric contrast opacifies nondilated ascending colon. Bilateral double-J ureteral stents are unchanged. Sequela of bilateral hip arthroplasties. Partially imaged left hip surgical drain. IMPRESSION: Mildly dilated small bowel loops, more conspicuous than prior exam. Indwelling enteric tube and bilateral ureteral stents, unchanged. Electronically Signed   By: Primitivo Gauze M.D.   On: 03/06/2020 10:02   DG Abd Portable 1V-Small Bowel Obstruction Protocol-initial, 8 hr delay  Result Date: 03/05/2020 CLINICAL DATA:  SBO EXAM: PORTABLE ABDOMEN - 1 VIEW COMPARISON:  March 04, 2020 FINDINGS: Persistent gaseous dilation of loops of small bowel. Small bowel loop measures up to 5.2 cm, similar in comparison to prior. There  is enteric contrast within Marcus Groll dilated loop of proximal small bowel. Enteric tube is coiled within the stomach with tip terminating over the distal stomach. Bilateral double-J stents. LEFT-sided surgical drain overlying the LEFT lateral leg. Soft tissue air of the LEFT lateral leg. Status post bilateral hip arthroplasty. Degenerative changes of the lower lumbar spine. IMPRESSION: Persistent gaseous dilation of loops of small bowel, similar in comparison to prior. Enteric contrast is within the dilated proximal small bowel. Electronically Signed   By: Valentino Saxon MD   On: 03/05/2020 11:12   ECHOCARDIOGRAM COMPLETE  Result Date: 03/06/2020    ECHOCARDIOGRAM REPORT   Patient Name:   [name] Date of Exam: 03/06/2020 Medical Rec #:  [id number]        Height:       74.0 in Accession #:    [id number]       Weight:       243.2 lb Date of Birth:  02-22-1946        BSA:          2.362 m Patient Age:    4 years         BP:           171/78 mmHg Patient Gender: M                HR:           86 bpm. Exam Location:  Inpatient Procedure: 2D Echo, Cardiac Doppler and Color Doppler Indications:    I48.91* Unspeicified  atrial fibrillation  History:        Patient has prior history of Echocardiogram examinations, most                 recent 06/24/2016. Previous Myocardial Infarction and CAD,                 Abnormal ECG, Arrythmias:Atrial Fibrillation,                 Signs/Symptoms:Chest Pain; Risk Factors:Diabetes, Former Smoker,                 Hypertension and Dyslipidemia. Cancer.  Sonographer:    Roseanna Rainbow RDCS Referring Phys: [phone] Staceyann Knouff CALDWELL POWELL JR  Sonographer Comments: Suboptimal parasternal window and suboptimal subcostal window. IMPRESSIONS  1. Left ventricular ejection fraction, by estimation, is 55 to 60%. The left ventricle has normal function. The left ventricle has no regional wall motion abnormalities. There is mild concentric left ventricular hypertrophy. Left ventricular diastolic function  could not be evaluated.  2. Right ventricular systolic function is normal. The right ventricular size is normal.  3. Left atrial size was mildly dilated.  4. The pericardial effusion is circumferential.  5. The mitral valve is normal in structure. Mild mitral valve regurgitation. No evidence of mitral stenosis.  6. The aortic valve is normal in structure. Aortic valve regurgitation is not visualized. Mild aortic valve sclerosis is present, with no evidence of aortic valve stenosis.  7. The inferior vena cava is normal in size with greater than 50% respiratory variability, suggesting right atrial pressure of 3 mmHg. Comparison(s): No significant change from prior study. Prior images reviewed side by side. FINDINGS  Left Ventricle: Left ventricular ejection fraction, by estimation, is 55 to 60%. The left ventricle has normal function. The left ventricle has no regional wall motion abnormalities. The left ventricular internal cavity size was normal in size. There is  mild concentric left ventricular hypertrophy. Left ventricular diastolic function could not be evaluated due to atrial fibrillation. Left ventricular diastolic function could not be evaluated. Right Ventricle: The right ventricular size is normal. No increase in right ventricular wall thickness. Right ventricular systolic function is normal. Left Atrium: Left atrial size was mildly dilated. Right Atrium: Right atrial size was normal in size. Pericardium: Trivial pericardial effusion is present. The pericardial effusion is circumferential. Mitral Valve: The mitral valve is normal in structure. Mild mitral annular calcification. Mild mitral valve regurgitation, with centrally-directed jet. No evidence of mitral valve stenosis. Tricuspid Valve: The tricuspid valve is normal in structure. Tricuspid valve regurgitation is not demonstrated. No evidence of tricuspid stenosis. Aortic Valve: The aortic valve is normal in structure. Aortic valve regurgitation is not  visualized. Mild aortic valve sclerosis is present, with no evidence of aortic valve stenosis. Pulmonic Valve: The pulmonic valve was not well visualized. Pulmonic valve regurgitation is not visualized. No evidence of pulmonic stenosis. Aorta: The aortic root is normal in size and structure. Venous: The inferior vena cava is normal in size with greater than 50% respiratory variability, suggesting right atrial pressure of 3 mmHg. IAS/Shunts: No atrial level shunt detected by color flow Doppler.  LEFT VENTRICLE PLAX 2D LVIDd:         4.00 cm      Diastology LVIDs:         2.80 cm      LV e' medial:    13.50 cm/s LV PW:         1.60 cm  LV E/e' medial:  6.0 LV IVS:        1.50 cm      LV e' lateral:   17.20 cm/s LVOT diam:     1.90 cm      LV E/e' lateral: 4.7 LV SV:         54 LV SV Index:   23 LVOT Area:     2.84 cm  LV Volumes (MOD) LV vol d, MOD A2C: 58.0 ml LV vol d, MOD A4C: 107.0 ml LV vol s, MOD A2C: 29.0 ml LV vol s, MOD A4C: 41.2 ml LV SV MOD A2C:     29.0 ml LV SV MOD A4C:     107.0 ml LV SV MOD BP:      47.0 ml RIGHT VENTRICLE         IVC TAPSE (M-mode): 2.7 cm  IVC diam: 1.70 cm LEFT ATRIUM             Index       RIGHT ATRIUM           Index LA diam:        3.70 cm 1.57 cm/m  RA Area:     15.30 cm LA Vol (A2C):   39.4 ml 16.68 ml/m RA Volume:   37.00 ml  15.67 ml/m LA Vol (A4C):   58.0 ml 24.56 ml/m LA Biplane Vol: 49.4 ml 20.92 ml/m  AORTIC VALVE LVOT Vmax:   110.00 cm/s LVOT Vmean:  75.100 cm/s LVOT VTI:    0.192 m  AORTA Ao Root diam: 3.30 cm MITRAL VALVE MV Area (PHT): 4.60 cm    SHUNTS MV Decel Time: 165 msec    Systemic VTI:  0.19 m MV E velocity: 81.40 cm/s  Systemic Diam: 1.90 cm Dani Gobble Croitoru MD Electronically signed by Sanda Klein MD Signature Date/Time: 03/06/2020/2:21:39 PM    Final         Scheduled Meds: . atorvastatin  40 mg Oral Daily  . bisacodyl  10 mg Rectal Daily  . Chlorhexidine Gluconate Cloth  6 each Topical Daily  . diclofenac Sodium  4 g Topical QID  .  ferrous sulfate  325 mg Oral Q breakfast  . gabapentin  300 mg Oral TID  . insulin aspart  0-15 Units Subcutaneous TID WC  . insulin aspart  0-5 Units Subcutaneous QHS  . lip balm  1 application Topical BID  . metoprolol tartrate  12.5 mg Oral BID  . pantoprazole  40 mg Oral Daily  . sodium chloride flush  5 mL Intracatheter Q8H   Continuous Infusions: . ceFEPime (MAXIPIME) IV    . diltiazem (CARDIZEM) infusion 15 mg/hr (03/06/20 1212)  . heparin 900 Units/hr (03/06/20 1550)  . sodium bicarbonate (isotonic) 150 mEq in D5W 1000 mL infusion 75 mL/hr at 03/06/20 1049     LOS: 3 days    Time spent: over 30 min    Fayrene Helper, MD [facility]   To contact the attending provider between 7A-7P or the covering provider during after hours 7P-7A, please log into the web site [email or web address] and access using universal [place] password for that web site. If you do not have the password, please call the hospital operator.  03/06/2020, 5:17 PM

## 2020-03-06 NOTE — Progress Notes (Signed)
[name] for heparin Indication: atrial fibrillation  Allergies  Allergen Reactions  . Shellfish Allergy Rash    Patient Measurements: Height: 6\' 2"  (188 cm) Weight: 110.3 kg (243 lb 2.7 oz) IBW/kg (Calculated) : 82.2 Heparin Dosing Weight: 105 kg  Vital Signs: Temp: 98.2 F (36.8 C) (11/26 0100) Temp Source: Axillary (11/26 0100) BP: 152/72 (11/26 0300) Pulse Rate: 49 (11/26 0300)  Labs: Recent Labs    03/04/20 0537 03/04/20 0537 03/04/20 1252 03/05/20 0428 03/06/20 0325  HGB 9.8*   < >  --  10.3* 9.9*  HCT 30.9*  --   --  32.1* 30.2*  PLT 596*  --   --  601* 605*  LABPROT  --   --  13.9  --   --   INR  --   --  1.1  --   --   HEPARINUNFRC  --   --   --   --  0.94*  CREATININE 2.20*  --   --  2.91*  --    < > = values in this interval not displayed.    Estimated Creatinine Clearance: 29.4 mL/min (A) (by C-G formula based on SCr of 2.91 mg/dL (H)).   Assessment: Patient is a 74 y.o M with hx afib (not on anticoag. PTA) presented to the ED from [facility] for management of septic left hip with plan for I&D on 03/09/20.  He was found to be in afib. Pharmacy is consulted to start heparin for afib.  Significant Events: - 11/24: IR placement of drainage cath into hip abscess  03/06/2020: - Heparin level 0.94- supra-therapeutic on heparin at 1500 units/hr - CBC: Hg low/stable, pltc elevated/stable - Scr remains elevated at 2.7 - RN reports no bleeding or infusion related issues  Goal of Therapy:  Heparin level 0.3-0.7 units/ml Monitor platelets by anticoagulation protocol: Yes   Plan:  - Decrease heparin infusion to 1200 units/hr (no bolus d/t recent IR procedure and recent adm of heparin SQ dose) - Recheck heparin level 8h after rate change - monitor for s/sx bleeding  Netta Cedars PharmD, BCPS 03/06/2020,3:59 AM

## 2020-03-06 NOTE — Progress Notes (Signed)
[name]  Date of Admission:  03/03/2020            Current antibiotics: Day 2 pip tazo  Reason for visit: Follow up on septic arthritis, bacteremia   ASSESSMENT AND PLAN:   [name] is a 74 y.o. male with PMHx of CAD, DM2, HTN, CKD, OA, s/p left hip arthoplasty (2001), prostate cancer, PAF, left ureteral stone s/p stenting (01/2020) who was sent from ortho clinic on 11/23 by Dr Wynelle Link.  Found to have left hip abscess s/p drainage and planning for DAIR with orthopedics surgery on Monday.  Cultures from hip growing E cloacae as are his blood cultures.  His urine culture similarly has a GNR.  # E. Cloacae bacteremia # Left hip septic arthritis/prosthetic joint infection, abscess: this likely represents a urinary source in light of GNR in urine culture and recent urologic procedure in October 2021.  E cloacae in both hip fluid and blood cultures # History of left hip arthroplasty # CKD  -- change to cefepime renally dosed due to concern for inducible chromosomal ampC resistance and Enterobacter infection -- follow up cultures -- surgical plan pending -- would ask urology re: further management regarding stents in light of bacteremia  I am available over the weekend, otherwise Dr Tommy Medal will be back on Monday   SUBJECTIVE:   Urine cx with GNR Blood cx with E cloacae Left hip abscess cx with E cloacae Afebrile No WBC  No new complaints today.  Feels about the same.  Pain is manageable but present in left hip.  No abd pain.  Tolerating rx.  No n/v/d.   Review of Systems: As noted above.  All other systems reviewed and are negative.   OBJECTIVE:   Allergies  Allergen Reactions  . Shellfish Allergy Rash    Blood pressure (!) 171/78, pulse 100, temperature 97.9 F (36.6 C), temperature source Oral, resp. rate 19, height 6\' 2"  (1.88 m), weight 110.3 kg, SpO2 94 %. Body mass index is 31.22 kg/m.  Physical  Exam Constitutional:      General: He is not in acute distress.    Appearance: Normal appearance.  HENT:     Head: Normocephalic and atraumatic.  Abdominal:     General: There is distension.     Palpations: Abdomen is soft.  Musculoskeletal:     Comments: Left hip drain in place.  Skin:    General: Skin is warm and dry.  Neurological:     General: No focal deficit present.     Mental Status: He is alert and oriented to person, place, and time.  Psychiatric:        Mood and Affect: Mood normal.        Behavior: Behavior normal.      Lab Results & Microbiology Lab Results  Component Value Date   WBC 9.9 03/06/2020   HGB 9.9 (L) 03/06/2020   HCT 30.2 (L) 03/06/2020   MCV 94.4 03/06/2020   PLT 605 (H) 03/06/2020    Lab Results  Component Value Date   NA 138 03/06/2020   K 4.3 03/06/2020   CO2 16 (L) 03/06/2020   GLUCOSE 191 (H) 03/06/2020   BUN 132 (H) 03/06/2020   CREATININE 2.70 (H) 03/06/2020   CALCIUM 8.4 (L) 03/06/2020   GFRNONAA 24 (L) 03/06/2020   GFRAA 20 (L) 01/13/2020    Lab Results  Component Value Date   ALT 19 03/06/2020   AST  15 03/06/2020   ALKPHOS 143 (H) 03/06/2020   BILITOT 0.8 03/06/2020     I have reviewed the micro and lab results in Epic.  Imaging CT ABDOMEN PELVIS WO CONTRAST  Result Date: 03/04/2020 CLINICAL DATA:  Nausea vomiting. EXAM: CT ABDOMEN AND PELVIS WITHOUT CONTRAST TECHNIQUE: Multidetector CT imaging of the abdomen and pelvis was performed following the standard protocol without IV contrast. COMPARISON:  CT abdomen pelvis 01/14/2020 FINDINGS: Lines and tubes: Enteric tube noted with tip within the gastric lumen and side port at the gastroesophageal junction. Bilateral ureteral stents with proximal pigtails in a superior pole calyx and distal pigtails not visualized due to streak artifact originating from bilateral femoral surgical hardware. Distal pigtails of the ureteral stents terminate in the expected region of the urinary  bladder. Lower chest: Coronary artery calcifications. Trace pericardial effusion. No acute abnormality. Hepatobiliary: No focal liver abnormality. No gallstones, gallbladder wall thickening, or pericholecystic fluid. No biliary dilatation. Pancreas: No focal lesion. Normal pancreatic contour. No surrounding inflammatory changes. No main pancreatic ductal dilatation. Spleen: Normal in size. There is a 1.6 cm fluid density lesion within the spleen likely represents a simple cyst. Otherwise no focal abnormality. Adrenals/Urinary Tract: No adrenal nodule bilaterally. Couple of tiny foci of gas noted within the collecting systems bilaterally. No nephrolithiasis, no hydronephrosis, and no contour-deforming renal mass. No ureterolithiasis or hydroureter. The urinary bladder is unremarkable. Stomach/Bowel: Stomach is within normal limits. The proximal mid small bowel is mildly distended with fluid of to 3.2 cm in caliber. No associated pneumatosis. There is suggestion of a possible transition point within the anterior abdomen at the level of the umbilicus. The distal small bowel is collapsed. No evidence of large bowel wall thickening or dilatation. Appendix appears normal. Vascular/Lymphatic: No abdominal aorta or iliac aneurysm. Severe atherosclerotic plaque of the aorta and its branches. No abdominal, pelvic, or inguinal lymphadenopathy. Reproductive: Not visualized due to streak artifact originating from bilateral its femoral surgical hardware. Other: No intraperitoneal free fluid. No intraperitoneal free gas. No organized fluid collection. Musculoskeletal: Tiny Richter hernia containing the anti mesenteric wall of a very short segment of small bowel (where the possible transition point is identified). No suspicious lytic or blastic osseous lesions. No acute displaced fracture. Multilevel degenerative changes of the spine. Partially visualized bilateral total hip arthroplasties. IMPRESSION: 1. Tiny Richter hernia  containing a very short loop of small bowel with an associated early/partial small bowel obstruction. 2. Couple of tiny foci of gas noted within the collecting systems bilaterally. Findings could be related to recent ureteral stent placement versus could represent emphysematous pyelitis. Recommend correlation with urinalysis for urinary tract infection. 3. Enteric tube with tip within the gastric lumen and side port at the gastroesophageal junction. Consider advancing by 3-5cm. 4.  Aortic Atherosclerosis (ICD10-I70.0). Electronically Signed   By: Iven Finn M.D.   On: 03/04/2020 21:09   DG Abd 1 View  Result Date: 03/04/2020 CLINICAL DATA:  NG tube placement EXAM: ABDOMEN - 1 VIEW COMPARISON:  03/04/2020 FINDINGS: Esophageal tube tip projects over the mid gastric region. Dilated loops of small bowel suspicious for obstruction. Partially visualized ureteral stents IMPRESSION: Esophageal tube tip overlies the mid stomach. Electronically Signed   By: Donavan Foil M.D.   On: 03/04/2020 22:46   DG Abd Portable 1V  Result Date: 03/06/2020 CLINICAL DATA:  Small-bowel obstruction. EXAM: PORTABLE ABDOMEN - 1 VIEW COMPARISON:  03/05/2020 abdominal radiographs and prior. FINDINGS: Indwelling enteric tube with tip and side hole overlying the gastric body.  Increased conspicuity of gas-filled mildly dilated small bowel loops. Residual enteric contrast opacifies nondilated ascending colon. Bilateral double-J ureteral stents are unchanged. Sequela of bilateral hip arthroplasties. Partially imaged left hip surgical drain. IMPRESSION: Mildly dilated small bowel loops, more conspicuous than prior exam. Indwelling enteric tube and bilateral ureteral stents, unchanged. Electronically Signed   By: Primitivo Gauze M.D.   On: 03/06/2020 10:02   DG Abd Portable 1V-Small Bowel Obstruction Protocol-initial, 8 hr delay  Result Date: 03/05/2020 CLINICAL DATA:  SBO EXAM: PORTABLE ABDOMEN - 1 VIEW COMPARISON:  March 04, 2020 FINDINGS: Persistent gaseous dilation of loops of small bowel. Small bowel loop measures up to 5.2 cm, similar in comparison to prior. There is enteric contrast within a dilated loop of proximal small bowel. Enteric tube is coiled within the stomach with tip terminating over the distal stomach. Bilateral double-J stents. LEFT-sided surgical drain overlying the LEFT lateral leg. Soft tissue air of the LEFT lateral leg. Status post bilateral hip arthroplasty. Degenerative changes of the lower lumbar spine. IMPRESSION: Persistent gaseous dilation of loops of small bowel, similar in comparison to prior. Enteric contrast is within the dilated proximal small bowel. Electronically Signed   By: Valentino Saxon MD   On: 03/05/2020 11:12   [place] FNA SOFT TISSUE  Result Date: 03/04/2020 INDICATION: History of left total hip replacement, now with concern for infection. Please perform ultrasound-guided aspiration and/or drainage catheter placement for infection source control purposes. EXAM: ULTRASOUND-GUIDED DRAINAGE CATHETER PLACEMENT INTO DOMINANT FLUID COLLECTION INVOLVING THE PROXIMAL LATERAL ASPECT THE LEFT THIGH COMPARISON:  Left hip CT-03/01/2020 MEDICATIONS: The patient is currently admitted to the hospital and receiving intravenous antibiotics. The antibiotics were administered within an appropriate time frame prior to the initiation of the procedure. ANESTHESIA/SEDATION: Moderate (conscious) sedation was employed during this procedure. A total of Versed 2 mg and Fentanyl 100 mcg was administered intravenously. Moderate Sedation Time: 18 minutes. The patient's level of consciousness and vital signs were monitored continuously by radiology nursing throughout the procedure under my direct supervision. CONTRAST:  None COMPLICATIONS: None immediate. PROCEDURE: Informed written consent was obtained from the patient after a discussion of the risks, benefits and alternatives to treatment. The patient was placed  supine on his hospital bed with ultrasound imaging demonstrating a large (at least 7.6 x 1.6 cm complex fluid collection with the subcutaneous tissues involving the lateral aspect of the proximal left thigh (representative image 2). The procedure was planned. A timeout was performed prior to the initiation of the procedure. The skin overlying the lateral aspect the left thigh was prepped and draped in the usual sterile fashion. The overlying soft tissues were anesthetized with 1% lidocaine with epinephrine. Utilizing a caudal to cranial approach, an 18 gauge trocar needle was advanced through near the entirety of the abscess/fluid collection within the left lateral thigh and a short Amplatz super stiff wire was coiled within the cranial component of the collection. Appropriate positioning was confirmed ultrasound imaging. The tract was serially dilated allowing placement of a 12 [place] all-purpose drainage catheter. Appropriate positioning was confirmed with a limited postprocedural CT scan. Next, approximately 50 cc of purulent fluid was aspirated. The tube was connected to a JP bulb and sutured in place. A dressing was placed. The patient tolerated the procedure well without immediate post procedural complication. IMPRESSION: Successful CT guided placement of a 26 French all purpose drain catheter into the complex fluid collection involving the proximal lateral aspect the left thigh with aspiration of 50 cc of purulent  fluid. Samples were sent to the laboratory as requested by the ordering clinical team. Electronically Signed   By: Sandi Mariscal M.D.   On: 03/04/2020 15:43       [facility] [place] [facility] [phone] pager 03/06/2020, 12:58 PM

## 2020-03-06 NOTE — Consult Note (Signed)
Urology Consult  Referring physician: Dr. 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 He presented to the ER on 11/23 with new left hip pain and bilateral flank pain which had been present for 2 days and was worsening. He underwent CT which showed an infected left hip joint and left emphysematous pyelitis with stents in good position. Foley was placed and continues to drain purulent urine. Patient is afebrile currently and flank pain has improved since admission. Creatinine 2.7 from baseline of 1.6. No other associated symptoms. No exacerbating/alleviating events  Past Medical History:  Diagnosis Date  . Arthritis   . Asthma    no inhaler  . Coronary artery disease    cardiologist-  dr spruill; last visit 3 mos ago per pt  . GERD (gastroesophageal reflux disease)   . History of MI (myocardial infarction)    1985  . Hydronephrosis, left   . Hypertension   . Myocardial infarction ([place])   . Nephrolithiasis    left  . 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 Presence of tooth-root and mandibular implants    lower dental implants  . Prostate cancer ([place])   . Shortness of breath    WITH EXERTION  . Type 2 diabetes mellitus ([place])    Past Surgical History:  Procedure Laterality Date  . BUNIONECTOMY    . CYSTOSCOPY W/ RETROGRADES Left 03/14/2014   Procedure: CYSTOSCOPY WITH LEFT RETROGRADE PYELOGRAM, Left Ureteroscopy, [place] ureteral Stent No string;  Surgeon: [name], MD;  Location: [facility];  Service: Urology;  Laterality: Left;  . CYSTOSCOPY W/ URETERAL STENT PLACEMENT Bilateral 01/15/2020   Procedure: CYSTOSCOPY WITH RETROGRADE PYELOGRAM/URETERAL STENT PLACEMENT;  Surgeon: Alexis Frock, MD;  Location: [facility];  Service: Urology;  Laterality:  Bilateral;  . HERNIA REPAIR    . PROSTATE BIOPSY    . TOTAL HIP ARTHROPLASTY Left 03-20-2009  . TOTAL HIP ARTHROPLASTY  04/09/2012   Procedure: TOTAL HIP ARTHROPLASTY;  Surgeon: [name], MD;  Location: [facility];  Service: Orthopedics;  Laterality: Right;  . TOTAL KNEE ARTHROPLASTY Left 05-16-2008    Medications: I have reviewed the patient's current medications. Allergies:  Allergies  Allergen Reactions  . Shellfish Allergy Rash    Family History  Problem Relation Age of Onset  . Cancer Mother        breast  . Multiple sclerosis Daughter   . Cancer Father        prostate  . Cancer Maternal Uncle        bone   Social History:  reports that he quit smoking about 35 years ago. His smoking use included cigarettes. He has a 25.00 pack-year smoking history. He quit smokeless tobacco use about 34 years ago. He reports current alcohol use. He reports that he does not use drugs.  Review of Systems  Gastrointestinal: Positive for abdominal distention and abdominal pain.  Genitourinary: Positive for flank pain and hematuria.  All other systems reviewed and are negative.   Physical Exam:  Vital signs in last 24 hours: Temp:  [97.9 F (36.6 C)-98.2 F (36.8 C)] 97.9 F (36.6 C) (11/26 0400) Pulse Rate:  [49-118] 100 (11/26 1200) Resp:  [13-20] 19 (11/26 1200) BP: (137-171)/(57-94) 171/78 (11/26 1200) SpO2:  [94 %-100 %] 94 % (11/26 1200) Physical Exam  Vitals reviewed.  Constitutional:      Appearance: Normal appearance.  HENT:     Head: Normocephalic and atraumatic.     Nose: Nose normal.     Mouth/Throat:     Mouth: Mucous membranes are dry.  Eyes:     Extraocular Movements: Extraocular movements intact.     Pupils: Pupils are equal, round, and reactive to light.  Cardiovascular:     Rate and Rhythm: Normal rate and regular rhythm.  Pulmonary:     Effort: Pulmonary effort is normal. No respiratory distress.  Abdominal:     General: There is distension.      Tenderness: There is abdominal tenderness.  Musculoskeletal:        General: Normal range of motion.     Cervical back: Normal range of motion and neck supple.  Skin:    General: Skin is warm and dry.  Neurological:     General: No focal deficit present.     Mental Status: He is alert and oriented to person, place, and time.  Psychiatric:        Mood and Affect: Mood normal.        Behavior: Behavior normal.        Thought Content: Thought content normal.        Judgment: Judgment normal.     Laboratory Data:  Results for orders placed or performed during the hospital encounter of 03/03/20 (from the past 72 hour(s))  Glucose, capillary     Status: Abnormal   Collection Time: 03/03/20  9:30 PM  Result Value Ref Range   Glucose-Capillary 225 (H) 70 - 99 mg/dL    Comment: Glucose reference range applies only to samples taken after fasting for at least 8 hours.  Basic metabolic panel     Status: Abnormal   Collection Time: 03/04/20  5:37 AM  Result Value Ref Range   Sodium 135 135 - 145 mmol/L   Potassium 4.7 3.5 - 5.1 mmol/L   Chloride 103 98 - 111 mmol/L   CO2 18 (L) 22 - 32 mmol/L   Glucose, Bld 198 (H) 70 - 99 mg/dL    Comment: Glucose reference range applies only to samples taken after fasting for at least 8 hours.   BUN 91 (H) 8 - 23 mg/dL   Creatinine, Ser 2.20 (H) 0.61 - 1.24 mg/dL   Calcium 8.6 (L) 8.9 - 10.3 mg/dL   GFR, Estimated 31 (L) >60 mL/min    Comment: (NOTE) Calculated using the CKD-EPI Creatinine Equation (2021)    Anion gap 14 5 - 15    Comment: Performed at [facility], [place] [address] [place], [place] [address]  CBC     Status: Abnormal   Collection Time: 03/04/20  5:37 AM  Result Value Ref Range   WBC 8.2 4.0 - 10.5 K/uL   RBC 3.33 (L) 4.22 - 5.81 MIL/uL   Hemoglobin 9.8 (L) 13.0 - 17.0 g/dL   HCT 30.9 (L) 39 - 52 %   MCV 92.8 80.0 - 100.0 fL   MCH 29.4 26.0 - 34.0 pg   MCHC 31.7 30.0 - 36.0 g/dL   RDW 14.5 11.5 - 15.5 %    Platelets 596 (H) 150 - 400 K/uL   nRBC 0.0 0.0 - 0.2 %    Comment: Performed at [facility], [place] [address], [place], [place] [address]  Glucose, capillary     Status: Abnormal   Collection Time: 03/04/20  7:27 AM  Result Value Ref Range   Glucose-Capillary 176 (H) 70 - 99 mg/dL    Comment: Glucose reference range applies only to samples taken after fasting for at least 8 hours.  Glucose, capillary     Status: Abnormal   Collection Time: 03/04/20 11:39 AM  Result Value Ref Range   Glucose-Capillary 213 (H) 70 - 99 mg/dL    Comment: Glucose reference range applies only to samples taken after fasting for at least 8 hours.  Protime-INR     Status: None   Collection Time: 03/04/20 12:52 PM  Result Value Ref Range   Prothrombin Time 13.9 11.4 - 15.2 seconds   INR 1.1 0.8 - 1.2    Comment: (NOTE) INR goal varies based on device and disease states. Performed at [facility], [place], [place] [address], [place], [place] [address]   Aerobic/Anaerobic Culture (surgical/deep wound)     Status: None (Preliminary result)   Collection Time: 03/04/20  3:38 PM   Specimen: Abscess  Result Value Ref Range   Specimen Description      ABSCESS DRAIN LEFT LATERAL THIGH Performed at [facility] [address] [place], [place] [address]    Special Requests      NONE Performed at [facility], [place] [address], [place], [place] [address]    Gram Stain      ABUNDANT WBC PRESENT,BOTH PMN AND MONONUCLEAR ABUNDANT GRAM NEGATIVE RODS Performed at [facility], [place] [address] [place], [place] [address]    Culture      ABUNDANT ENTEROBACTER CLOACAE NO ANAEROBES ISOLATED; CULTURE IN PROGRESS FOR 5 DAYS    Report Status PENDING    Organism ID, Bacteria ENTEROBACTER CLOACAE       Susceptibility   Enterobacter cloacae - MIC*    CEFAZOLIN >=64 RESISTANT Resistant     CEFEPIME <=0.12 SENSITIVE Sensitive     CEFTAZIDIME <=1  SENSITIVE Sensitive     CIPROFLOXACIN <=0.25 SENSITIVE Sensitive     GENTAMICIN <=1 SENSITIVE Sensitive     IMIPENEM 0.5 SENSITIVE Sensitive     TRIMETH/SULFA <=20 SENSITIVE Sensitive     PIP/TAZO <=4 SENSITIVE Sensitive     * ABUNDANT ENTEROBACTER CLOACAE  Glucose, capillary     Status: Abnormal   Collection Time: 03/04/20  4:03 PM  Result Value Ref Range   Glucose-Capillary 172 (H) 70 - 99 mg/dL    Comment: Glucose reference range applies only to samples taken after fasting for at least 8 hours.  Glucose, capillary     Status: Abnormal   Collection Time: 03/04/20 10:10 PM  Result Value Ref Range   Glucose-Capillary 144 (H) 70 - 99 mg/dL    Comment: Glucose reference range applies only to samples taken after fasting for at least 8 hours.  Urinalysis, Routine w reflex microscopic Urine, Clean Catch     Status: Abnormal   Collection Time: 03/05/20  1:00 AM  Result Value Ref Range   Color, Urine YELLOW YELLOW   APPearance TURBID (A) CLEAR   Specific Gravity, Urine 1.016 1.005 - 1.030   pH 5.0 5.0 - 8.0   Glucose, UA NEGATIVE NEGATIVE mg/dL   Hgb urine dipstick LARGE (A) NEGATIVE   Bilirubin Urine NEGATIVE NEGATIVE   Ketones, ur NEGATIVE NEGATIVE mg/dL   Protein, ur 100 (A) NEGATIVE mg/dL   Nitrite NEGATIVE NEGATIVE   Leukocytes,Ua MODERATE (A) NEGATIVE   RBC / HPF 11-20 0 - 5 RBC/hpf   WBC, UA 21-50 0 - 5 WBC/hpf  Bacteria, UA MANY (A) NONE SEEN   Squamous Epithelial / LPF 0-5 0 - 5   Amorphous Crystal PRESENT     Comment: Performed at [facility], [place] [address] [place], [place] [address]  CBC with Differential/Platelet     Status: Abnormal   Collection Time: 03/05/20  4:28 AM  Result Value Ref Range   WBC 7.4 4.0 - 10.5 K/uL    Comment: WHITE COUNT CONFIRMED ON SMEAR   RBC 3.46 (L) 4.22 - 5.81 MIL/uL   Hemoglobin 10.3 (L) 13.0 - 17.0 g/dL   HCT 32.1 (L) 39 - 52 %   MCV 92.8 80.0 - 100.0 fL   MCH 29.8 26.0 - 34.0 pg   MCHC 32.1 30.0 - 36.0 g/dL    RDW 14.6 11.5 - 15.5 %   Platelets 601 (H) 150 - 400 K/uL   nRBC 0.0 0.0 - 0.2 %   Neutrophils Relative % 77 %   Neutro Abs 6.4 1.7 - 7.7 K/uL   Band Neutrophils 9 %   Lymphocytes Relative 7 %   Lymphs Abs 0.5 (L) 0.7 - 4.0 K/uL   Monocytes Relative 7 %   Monocytes Absolute 0.5 0.1 - 1.0 K/uL   Eosinophils Relative 0 %   Eosinophils Absolute 0.0 0.0 - 0.5 K/uL   Basophils Relative 0 %   Basophils Absolute 0.0 0.0 - 0.1 K/uL   WBC Morphology MILD LEFT SHIFT (1-5% METAS, OCC MYELO, OCC BANDS)    Abs Immature Granulocytes 0.00 0.00 - 0.07 K/uL    Comment: Performed at [facility], [place] [address], [place], [place] [address]  Comprehensive metabolic panel     Status: Abnormal   Collection Time: 03/05/20  4:28 AM  Result Value Ref Range   Sodium 138 135 - 145 mmol/L   Potassium 4.6 3.5 - 5.1 mmol/L   Chloride 105 98 - 111 mmol/L   CO2 18 (L) 22 - 32 mmol/L   Glucose, Bld 186 (H) 70 - 99 mg/dL    Comment: Glucose reference range applies only to samples taken after fasting for at least 8 hours.   BUN 117 (H) 8 - 23 mg/dL    Comment: RESULTS CONFIRMED BY MANUAL DILUTION   Creatinine, Ser 2.91 (H) 0.61 - 1.24 mg/dL   Calcium 8.6 (L) 8.9 - 10.3 mg/dL   Total Protein 8.4 (H) 6.5 - 8.1 g/dL   Albumin 2.6 (L) 3.5 - 5.0 g/dL   AST 14 (L) 15 - 41 U/L   ALT 21 0 - 44 U/L   Alkaline Phosphatase 157 (H) 38 - 126 U/L   Total Bilirubin 0.8 0.3 - 1.2 mg/dL   GFR, Estimated 22 (L) >60 mL/min    Comment: (NOTE) Calculated using the CKD-EPI Creatinine Equation (2021)    Anion gap 15 5 - 15    Comment: Performed at [facility], [place] [address] [place], [place] [address]  Magnesium     Status: Abnormal   Collection Time: 03/05/20  4:28 AM  Result Value Ref Range   Magnesium 3.4 (H) 1.7 - 2.4 mg/dL    Comment: Performed at [facility], [place] [address], [place], [place] [address]  Phosphorus     Status: Abnormal   Collection Time: 03/05/20   4:28 AM  Result Value Ref Range   Phosphorus 6.5 (H) 2.5 - 4.6 mg/dL    Comment: Performed at [facility], [place] [address] [place],  [address]  C-reactive  protein     Status: Abnormal   Collection Time: 03/05/20  4:28 AM  Result Value Ref Range   CRP 23.3 (H) <1.0 mg/dL    Comment: Performed at [facility], [place] [address] [place], [place] [address]  Glucose, capillary     Status: Abnormal   Collection Time: 03/05/20  7:51 AM  Result Value Ref Range   Glucose-Capillary 154 (H) 70 - 99 mg/dL    Comment: Glucose reference range applies only to samples taken after fasting for at least 8 hours.  Culture, Urine     Status: Abnormal (Preliminary result)   Collection Time: 03/05/20  8:10 AM   Specimen: Urine, Random  Result Value Ref Range   Specimen Description      URINE, RANDOM Performed at [place] [address], [place], [place] [address]    Special Requests      NONE Performed at [facility], [place] [address], [place], [place] [address]    Culture (A)     >=100,000 COLONIES/mL GRAM NEGATIVE RODS SUSCEPTIBILITIES TO FOLLOW Performed at [facility], [place] [address], [place], [place] [address]    Report Status PENDING   Glucose, capillary     Status: Abnormal   Collection Time: 03/05/20 11:16 AM  Result Value Ref Range   Glucose-Capillary 154 (H) 70 - 99 mg/dL    Comment: Glucose reference range applies only to samples taken after fasting for at least 8 hours.  Glucose, capillary     Status: Abnormal   Collection Time: 03/05/20  5:08 PM  Result Value Ref Range   Glucose-Capillary 165 (H) 70 - 99 mg/dL    Comment: Glucose reference range applies only to samples taken after fasting for at least 8 hours.  Glucose, capillary     Status: Abnormal   Collection Time: 03/05/20  9:20 PM  Result Value Ref Range   Glucose-Capillary 164 (H) 70 - 99 mg/dL    Comment: Glucose reference range  applies only to samples taken after fasting for at least 8 hours.  CBC with Differential/Platelet     Status: Abnormal   Collection Time: 03/06/20  3:25 AM  Result Value Ref Range   WBC 9.9 4.0 - 10.5 K/uL   RBC 3.20 (L) 4.22 - 5.81 MIL/uL   Hemoglobin 9.9 (L) 13.0 - 17.0 g/dL   HCT 30.2 (L) 39 - 52 %   MCV 94.4 80.0 - 100.0 fL   MCH 30.9 26.0 - 34.0 pg   MCHC 32.8 30.0 - 36.0 g/dL   RDW 14.8 11.5 - 15.5 %   Platelets 605 (H) 150 - 400 K/uL   nRBC 0.0 0.0 - 0.2 %   Neutrophils Relative % 85 %   Neutro Abs 8.3 (H) 1.7 - 7.7 K/uL   Lymphocytes Relative 6 %   Lymphs Abs 0.6 (L) 0.7 - 4.0 K/uL   Monocytes Relative 8 %   Monocytes Absolute 0.8 0.1 - 1.0 K/uL   Eosinophils Relative 1 %   Eosinophils Absolute 0.1 0.0 - 0.5 K/uL   Basophils Relative 0 %   Basophils Absolute 0.0 0.0 - 0.1 K/uL   Immature Granulocytes 0 %   Abs Immature Granulocytes 0.04 0.00 - 0.07 K/uL    Comment: Performed at [facility], [place] [address], [place], [place] [address]  Comprehensive metabolic panel     Status: Abnormal   Collection Time: 03/06/20  3:25 AM  Result Value Ref Range  Sodium 138 135 - 145 mmol/L   Potassium 4.3 3.5 - 5.1 mmol/L   Chloride 106 98 - 111 mmol/L   CO2 16 (L) 22 - 32 mmol/L   Glucose, Bld 191 (H) 70 - 99 mg/dL    Comment: Glucose reference range applies only to samples taken after fasting for at least 8 hours.   BUN 132 (H) 8 - 23 mg/dL    Comment: RESULTS CONFIRMED BY MANUAL DILUTION   Creatinine, Ser 2.70 (H) 0.61 - 1.24 mg/dL   Calcium 8.4 (L) 8.9 - 10.3 mg/dL   Total Protein 8.1 6.5 - 8.1 g/dL   Albumin 2.6 (L) 3.5 - 5.0 g/dL   AST 15 15 - 41 U/L   ALT 19 0 - 44 U/L   Alkaline Phosphatase 143 (H) 38 - 126 U/L   Total Bilirubin 0.8 0.3 - 1.2 mg/dL   GFR, Estimated 24 (L) >60 mL/min    Comment: (NOTE) Calculated using the CKD-EPI Creatinine Equation (2021)    Anion gap 16 (H) 5 - 15    Comment: Performed at [facility], [place]  [address] [place], [place] [address]  Magnesium     Status: Abnormal   Collection Time: 03/06/20  3:25 AM  Result Value Ref Range   Magnesium 3.3 (H) 1.7 - 2.4 mg/dL    Comment: Performed at [facility], [place] [address], [place], [place] [address]  Phosphorus     Status: Abnormal   Collection Time: 03/06/20  3:25 AM  Result Value Ref Range   Phosphorus 6.3 (H) 2.5 - 4.6 mg/dL    Comment: Performed at [facility], [place] [address], [place], [place] [address]  Heparin level (unfractionated)     Status: Abnormal   Collection Time: 03/06/20  3:25 AM  Result Value Ref Range   Heparin Unfractionated 0.94 (H) 0.30 - 0.70 IU/mL    Comment: (NOTE) If heparin results are below expected values, and patient dosage has  been confirmed, suggest follow up testing of antithrombin III levels. Performed at [facility], [place] [address], [place], [place] [address]   Glucose, capillary     Status: Abnormal   Collection Time: 03/06/20  7:25 AM  Result Value Ref Range   Glucose-Capillary 164 (H) 70 - 99 mg/dL    Comment: Glucose reference range applies only to samples taken after fasting for at least 8 hours.  Glucose, capillary     Status: Abnormal   Collection Time: 03/06/20 11:54 AM  Result Value Ref Range   Glucose-Capillary 182 (H) 70 - 99 mg/dL    Comment: Glucose reference range applies only to samples taken after fasting for at least 8 hours.  Heparin level (unfractionated)     Status: Abnormal   Collection Time: 03/06/20 12:45 PM  Result Value Ref Range   Heparin Unfractionated 1.02 (H) 0.30 - 0.70 IU/mL    Comment: (NOTE) If heparin results are below expected values, and patient dosage has  been confirmed, suggest follow up testing of antithrombin III levels. Performed at [facility], [place] [address] [place], [place] [address]   Glucose, capillary     Status: Abnormal   Collection Time: 03/06/20  5:46 PM  Result  Value Ref Range   Glucose-Capillary 150 (H) 70 - 99 mg/dL    Comment: Glucose reference range applies only to samples taken after fasting for at least 8 hours.   Recent Results (from the past 240 hour(s))  Resp  Panel by RT-PCR (Flu A&B, Covid) Nasopharyngeal Swab     Status: None   Collection Time: 03/01/20 12:20 PM   Specimen: Nasopharyngeal Swab; Nasopharyngeal(NP) swabs in vial transport medium  Result Value Ref Range Status   SARS Coronavirus 2 by RT PCR NEGATIVE NEGATIVE Final    Comment: (NOTE) SARS-CoV-2 target nucleic acids are NOT DETECTED.  The SARS-CoV-2 RNA is generally detectable in upper respiratory specimens during the acute phase of infection. The lowest concentration of SARS-CoV-2 viral copies this assay can detect is 138 copies/mL. A negative result does not preclude SARS-Cov-2 infection and should not be used as the sole basis for treatment or other patient management decisions. A negative result may occur with  improper specimen collection/handling, submission of specimen other than nasopharyngeal swab, presence of viral mutation(s) within the areas targeted by this assay, and inadequate number of viral copies(<138 copies/mL). A negative result must be combined with clinical observations, patient history, and epidemiological information. The expected result is Negative.  Fact Sheet for Patients:  EntrepreneurPulse.com.au  Fact Sheet for Healthcare Providers:  IncredibleEmployment.be  This test is no t yet approved or cleared by the [place] FDA and  has been authorized for detection and/or diagnosis of SARS-CoV-2 by FDA under an Emergency Use Authorization (EUA). This EUA will remain  in effect (meaning this test can be used) for the duration of the COVID-19 declaration under Section 564(b)(1) of the Act, 21 U.S.C.section 360bbb-3(b)(1), unless the authorization is terminated  or revoked sooner.       Influenza A by  PCR NEGATIVE NEGATIVE Final   Influenza B by PCR NEGATIVE NEGATIVE Final    Comment: (NOTE) The Xpert Xpress SARS-CoV-2/FLU/RSV plus assay is intended as an aid in the diagnosis of influenza from Nasopharyngeal swab specimens and should not be used as a sole basis for treatment. Nasal washings and aspirates are unacceptable for Xpert Xpress SARS-CoV-2/FLU/RSV testing.  Fact Sheet for Patients: EntrepreneurPulse.com.au  Fact Sheet for Healthcare Providers: IncredibleEmployment.be  This test is not yet approved or cleared by the [place] FDA and has been authorized for detection and/or diagnosis of SARS-CoV-2 by FDA under an Emergency Use Authorization (EUA). This EUA will remain in effect (meaning this test can be used) for the duration of the COVID-19 declaration under Section 564(b)(1) of the Act, 21 U.S.C. section 360bbb-3(b)(1), unless the authorization is terminated or revoked.  Performed at [facility], [place] [address] [place], [place] [address]   Culture, blood (routine x 2)     Status: None (Preliminary result)   Collection Time: 03/03/20  7:04 PM   Specimen: BLOOD LEFT HAND  Result Value Ref Range Status   Specimen Description   Final    BLOOD LEFT HAND Performed at [facility] [address] [place], [place] [address]    Special Requests   Final    BOTTLES DRAWN AEROBIC ONLY Blood Culture adequate volume Performed at [facility] [address] [place], [place] [address]    Culture  Setup Time   Final    AEROBIC BOTTLE ONLY GRAM NEGATIVE RODS Organism ID to follow CRITICAL RESULT CALLED TO, READ BACK BY AND VERIFIED WITH: Ulice Bold PharmD 9:20 03/06/20 ([email or web address])    Culture   Final    NO GROWTH 3 DAYS Performed at [facility], [place] [address], [place], [place] [address]    Report Status PENDING  Incomplete  Blood Culture ID Panel (Reflexed)     Status:  Abnormal   Collection Time: 03/03/20  7:04 PM  Result Value Ref Range Status   Enterococcus faecalis NOT DETECTED NOT DETECTED Final   Enterococcus Faecium NOT DETECTED NOT DETECTED Final   Listeria monocytogenes NOT DETECTED NOT DETECTED Final   Staphylococcus species NOT DETECTED NOT DETECTED Final   Staphylococcus aureus (BCID) NOT DETECTED NOT DETECTED Final   Staphylococcus epidermidis NOT DETECTED NOT DETECTED Final   Staphylococcus lugdunensis NOT DETECTED NOT DETECTED Final   Streptococcus species NOT DETECTED NOT DETECTED Final   Streptococcus agalactiae NOT DETECTED NOT DETECTED Final   Streptococcus pneumoniae NOT DETECTED NOT DETECTED Final   Streptococcus pyogenes NOT DETECTED NOT DETECTED Final   A.calcoaceticus-baumannii NOT DETECTED NOT DETECTED Final   Bacteroides fragilis NOT DETECTED NOT DETECTED Final   Enterobacterales DETECTED (A) NOT DETECTED Final    Comment: Enterobacterales represent a large order of gram negative bacteria, not a single organism. CRITICAL RESULT CALLED TO, READ BACK BY AND VERIFIED WITH: Ulice Bold PharmD 9:20 03/06/20 ([email or web address])    Enterobacter cloacae complex DETECTED (A) NOT DETECTED Final    Comment: CRITICAL RESULT CALLED TO, READ BACK BY AND VERIFIED WITH: Ulice Bold PharmD 9:20 03/06/20 ([email or web address])    Escherichia coli NOT DETECTED NOT DETECTED Final   Klebsiella aerogenes NOT DETECTED NOT DETECTED Final   Klebsiella oxytoca NOT DETECTED NOT DETECTED Final   Klebsiella pneumoniae NOT DETECTED NOT DETECTED Final   Proteus species NOT DETECTED NOT DETECTED Final   Salmonella species NOT DETECTED NOT DETECTED Final   Serratia marcescens NOT DETECTED NOT DETECTED Final   Haemophilus influenzae NOT DETECTED NOT DETECTED Final   Neisseria meningitidis NOT DETECTED NOT DETECTED Final   Pseudomonas aeruginosa NOT DETECTED NOT DETECTED Final   Stenotrophomonas maltophilia NOT DETECTED NOT DETECTED Final   Candida albicans NOT DETECTED NOT DETECTED  Final   Candida auris NOT DETECTED NOT DETECTED Final   Candida glabrata NOT DETECTED NOT DETECTED Final   Candida krusei NOT DETECTED NOT DETECTED Final   Candida parapsilosis NOT DETECTED NOT DETECTED Final   Candida tropicalis NOT DETECTED NOT DETECTED Final   Cryptococcus neoformans/gattii NOT DETECTED NOT DETECTED Final   CTX-M ESBL NOT DETECTED NOT DETECTED Final   Carbapenem resistance IMP NOT DETECTED NOT DETECTED Final   Carbapenem resistance KPC NOT DETECTED NOT DETECTED Final   Carbapenem resistance NDM NOT DETECTED NOT DETECTED Final   Carbapenem resist OXA 48 LIKE NOT DETECTED NOT DETECTED Final   Carbapenem resistance VIM NOT DETECTED NOT DETECTED Final    Comment: Performed at [facility], [address], [place], [place] [address]  Culture, blood (routine x 2)     Status: None (Preliminary result)   Collection Time: 03/03/20  7:06 PM   Specimen: BLOOD RIGHT HAND  Result Value Ref Range Status   Specimen Description   Final    BLOOD RIGHT HAND Performed at [facility] [address] [place], [place] [address]    Special Requests   Final    BOTTLES DRAWN AEROBIC ONLY Blood Culture adequate volume Performed at [facility] [address], [place], [place] [address]    Culture   Final    NO GROWTH 3 DAYS Performed at [facility], [place] [address], [place], [place] [address]    Report Status PENDING  Incomplete  Aerobic/Anaerobic Culture (surgical/deep wound)     Status: None (Preliminary result)   Collection Time: 03/04/20  3:38 PM   Specimen: Abscess  Result  Value Ref Range Status   Specimen Description   Final    ABSCESS DRAIN LEFT LATERAL THIGH Performed at [facility] [address], [place], [place] [address]    Special Requests   Final    NONE Performed at [facility], [place] [address], [place], [place] [address]    Gram Stain   Final    ABUNDANT WBC PRESENT,BOTH  PMN AND MONONUCLEAR ABUNDANT GRAM NEGATIVE RODS Performed at [facility], [place] [address], [place], [place] [address]    Culture   Final    ABUNDANT ENTEROBACTER CLOACAE NO ANAEROBES ISOLATED; CULTURE IN PROGRESS FOR 5 DAYS    Report Status PENDING  Incomplete   Organism ID, Bacteria ENTEROBACTER CLOACAE  Final      Susceptibility   Enterobacter cloacae - MIC*    CEFAZOLIN >=64 RESISTANT Resistant     CEFEPIME <=0.12 SENSITIVE Sensitive     CEFTAZIDIME <=1 SENSITIVE Sensitive     CIPROFLOXACIN <=0.25 SENSITIVE Sensitive     GENTAMICIN <=1 SENSITIVE Sensitive     IMIPENEM 0.5 SENSITIVE Sensitive     TRIMETH/SULFA <=20 SENSITIVE Sensitive     PIP/TAZO <=4 SENSITIVE Sensitive     * ABUNDANT ENTEROBACTER CLOACAE  Culture, Urine     Status: Abnormal (Preliminary result)   Collection Time: 03/05/20  8:10 AM   Specimen: Urine, Random  Result Value Ref Range Status   Specimen Description   Final    URINE, RANDOM Performed at [facility], [place] [address] [place], [place] [address]    Special Requests   Final    NONE Performed at [facility], [place] [address] [place], [place] [address]    Culture (A)  Final    >=100,000 COLONIES/mL GRAM NEGATIVE RODS SUSCEPTIBILITIES TO FOLLOW Performed at [facility], [place] [address], [place],  [address]    Report Status PENDING  Incomplete   Creatinine: Recent Labs    03/01/20 0925 03/04/20 0537 03/05/20 0428 03/06/20 0325  CREATININE 2.17* 2.20* 2.91* 2.70*   Baseline Creatinine: 1.6  Impression/Assessment:  74yo with emphysematous pyelitis  Plan:  I discussed the management of emphysematous pyelitis with the patient. Currently he is producing urine with the stent and foley in place. The patient should be continued on broad spectrum antibiotics pending his urine culture. If he develops new onset fevers or decreased urine output/worsening creatinine he would benefit from  bilateral nephrostomy tube placement  Nicolette Bang 03/06/2020, 7:10 PM

## 2020-03-07 DIAGNOSIS — K56609 Unspecified intestinal obstruction, unspecified as to partial versus complete obstruction: Secondary | ICD-10-CM

## 2020-03-07 LAB — URINE CULTURE: Culture: >=100000 — AB

## 2020-03-07 LAB — CBC WITH DIFFERENTIAL/PLATELET
Abs Immature Granulocytes: 0.05 10*3/uL (ref 0.00–0.07)
Basophils Absolute: 0 10*3/uL (ref 0.0–0.1)
Basophils Relative: 0 %
Eosinophils Absolute: 0.1 10*3/uL (ref 0.0–0.5)
Eosinophils Relative: 1 %
HCT: 29.2 % — ABNORMAL LOW (ref 39.0–52.0)
Hemoglobin: 9.4 g/dL — ABNORMAL LOW (ref 13.0–17.0)
Immature Granulocytes: 1 %
Lymphocytes Relative: 8 %
Lymphs Abs: 0.6 10*3/uL — ABNORMAL LOW (ref 0.7–4.0)
MCH: 30.3 pg (ref 26.0–34.0)
MCHC: 32.2 g/dL (ref 30.0–36.0)
MCV: 94.2 fL (ref 80.0–100.0)
Monocytes Absolute: 0.7 10*3/uL (ref 0.1–1.0)
Monocytes Relative: 9 %
Neutro Abs: 6.4 10*3/uL (ref 1.7–7.7)
Neutrophils Relative %: 81 %
Platelets: 573 10*3/uL — ABNORMAL HIGH (ref 150–400)
RBC: 3.1 MIL/uL — ABNORMAL LOW (ref 4.22–5.81)
RDW: 14.8 % (ref 11.5–15.5)
WBC: 7.9 10*3/uL (ref 4.0–10.5)
nRBC: 0 % (ref 0.0–0.2)

## 2020-03-07 LAB — COMPREHENSIVE METABOLIC PANEL
ALT: 14 U/L (ref 0–44)
AST: 16 U/L (ref 15–41)
Albumin: 2.4 g/dL — ABNORMAL LOW (ref 3.5–5.0)
Alkaline Phosphatase: 116 U/L (ref 38–126)
Anion gap: 13 (ref 5–15)
BUN: 131 mg/dL — ABNORMAL HIGH (ref 8–23)
CO2: 25 mmol/L (ref 22–32)
Calcium: 8 mg/dL — ABNORMAL LOW (ref 8.9–10.3)
Chloride: 102 mmol/L (ref 98–111)
Creatinine, Ser: 2.55 mg/dL — ABNORMAL HIGH (ref 0.61–1.24)
GFR, Estimated: 26 mL/min — ABNORMAL LOW (ref >60)
Glucose, Bld: 241 mg/dL — ABNORMAL HIGH (ref 70–99)
Potassium: 3.9 mmol/L (ref 3.5–5.1)
Sodium: 140 mmol/L (ref 135–145)
Total Bilirubin: 0.6 mg/dL (ref 0.3–1.2)
Total Protein: 7.4 g/dL (ref 6.5–8.1)

## 2020-03-07 LAB — PHOSPHORUS: Phosphorus: 5 mg/dL — ABNORMAL HIGH (ref 2.5–4.6)

## 2020-03-07 LAB — HEPARIN LEVEL (UNFRACTIONATED): Heparin Unfractionated: 0.43 IU/mL (ref 0.30–0.70)

## 2020-03-07 LAB — GLUCOSE, CAPILLARY
Glucose-Capillary: 201 mg/dL — ABNORMAL HIGH (ref 70–99)
Glucose-Capillary: 156 mg/dL — ABNORMAL HIGH (ref 70–99)
Glucose-Capillary: 141 mg/dL — ABNORMAL HIGH (ref 70–99)
Glucose-Capillary: 149 mg/dL — ABNORMAL HIGH (ref 70–99)

## 2020-03-07 LAB — MAGNESIUM: Magnesium: 3.1 mg/dL — ABNORMAL HIGH (ref 1.7–2.4)

## 2020-03-07 MED ORDER — LACTATED RINGERS IV SOLN: INTRAVENOUS | Status: AC

## 2020-03-07 NOTE — Progress Notes (Signed)
[place] for heparin Indication: atrial fibrillation  Allergies  Allergen Reactions  . Shellfish Allergy Rash    Patient Measurements: Height: 6\' 2"  (188 cm) Weight: 110.3 kg (243 lb 2.7 oz) IBW/kg (Calculated) : 82.2 Heparin Dosing Weight: 105 kg  Vital Signs: Temp: 97.5 F (36.4 C) (11/26 2000) Temp Source: Oral (11/26 2000) BP: 127/67 (11/26 2000) Pulse Rate: 54 (11/26 2000)  Labs: Recent Labs    03/04/20 0537 03/04/20 0537 03/04/20 1252 03/05/20 0428 03/06/20 0325 03/06/20 1245 03/06/20 2257  HGB 9.8*   < >  --  10.3* 9.9*  --   --   HCT 30.9*  --   --  32.1* 30.2*  --   --   PLT 596*  --   --  601* 605*  --   --   LABPROT  --   --  13.9  --   --   --   --   INR  --   --  1.1  --   --   --   --   HEPARINUNFRC  --   --   --   --  0.94* 1.02* 0.63  CREATININE 2.20*  --   --  2.91* 2.70*  --   --    < > = values in this interval not displayed.    Estimated Creatinine Clearance: 31.7 mL/min (A) (by C-G formula based on SCr of 2.7 mg/dL (H)).   Assessment: Patient is a 74 y.o M with hx afib (not on anticoag. PTA) presented to the ED from [facility] for management of septic left hip with plan for I&D on 03/09/20.  He was found to be in afib. Pharmacy is consulted to start heparin for afib.  Significant Events: - 11/24: IR placement of drainage cath into hip abscess  Today, 03/07/2020: - Heparin level = 0.63 (therapeutic) heparin drip running at 900 units/hr - No bleeding or infusion related issues noted  Goal of Therapy:  Heparin level 0.3-0.7 units/ml Monitor platelets by anticoagulation protocol: Yes   Plan:  -Continue IV heparin gtt at 900 units/hr  - Recheck heparin level in 8hr to confirm therapeutic dose - monitor for s/sx bleeding - please advise when heparin drip needs to held for OR procedure on 11/29  Everette Rank PharmD    03/07/2020,12:11 AM

## 2020-03-07 NOTE — Consult Note (Signed)
Ref: Luetta Nutting, DO   Subjective:  A. Flutter/fib continues. HR now down to 80's to 100. Abdomen remains distended.  No chest pain. Creatinine is 2.55 mg.  Objective:  Vital Signs in the last 24 hours: Temp:  [97.5 F (36.4 C)-98.2 F (36.8 C)] 98 F (36.7 C) (11/27 0500) Pulse Rate:  [48-113] 113 (11/27 1040) Cardiac Rhythm: Atrial fibrillation;Atrial flutter (11/27 0900) Resp:  [11-29] 23 (11/27 1040) BP: (127-171)/(59-78) 153/70 (11/27 1040) SpO2:  [92 %-100 %] 99 % (11/27 1040) Weight:  [95.8 kg] 95.8 kg (11/27 0500)  Physical Exam: BP Readings from Last 1 Encounters:  03/07/20 (!) 153/70     Wt Readings from Last 1 Encounters:  03/07/20 95.8 kg    Weight change:  Body mass index is 27.12 kg/m. HEENT: [place]/AT, Eyes-Brown, Conjunctiva-Pale pink, Sclera-Non-icteric Neck: No JVD, No bruit, Trachea midline. Lungs:  Clear, Bilateral. Cardiac:  Regular rhythm, normal S1 and S2, no S3. II/VI systolic murmur. Abdomen:  Soft, distended, non-tender.  Extremities:  No edema present. No cyanosis. No clubbing. CNS: AxOx2, Cranial nerves grossly intact, moves all 4 extremities.  Skin: Warm and dry.   Intake/Output from previous day: 11/26 0701 - 11/27 0700 In: 2748.9 [I.V.:2548.9; IV Piggyback:200] Out: 1732.5 [Urine:1525; Emesis/NG output:200; Drains:7.5]    Lab Results: BMET    Component Value Date/Time   NA 140 03/07/2020 0659   NA 138 03/06/2020 0325   NA 138 03/05/2020 0428   NA 138 10/01/2019 0803   K 3.9 03/07/2020 0659   K 4.3 03/06/2020 0325   K 4.6 03/05/2020 0428   CL 102 03/07/2020 0659   CL 106 03/06/2020 0325   CL 105 03/05/2020 0428   CO2 25 03/07/2020 0659   CO2 16 (L) 03/06/2020 0325   CO2 18 (L) 03/05/2020 0428   GLUCOSE 241 (H) 03/07/2020 0659   GLUCOSE 191 (H) 03/06/2020 0325   GLUCOSE 186 (H) 03/05/2020 0428   BUN 131 (H) 03/07/2020 0659   BUN 132 (H) 03/06/2020 0325   BUN 117 (H) 03/05/2020 0428   BUN 27 (A) 10/01/2019 0803    CREATININE 2.55 (H) 03/07/2020 0659   CREATININE 2.70 (H) 03/06/2020 0325   CREATININE 2.91 (H) 03/05/2020 0428   CREATININE 1.63 (H) 01/21/2020 0953   CREATININE 3.31 (H) 01/13/2020 0716   CREATININE 3.11 (H) 01/06/2020 0751   CALCIUM 8.0 (L) 03/07/2020 0659   CALCIUM 8.4 (L) 03/06/2020 0325   CALCIUM 8.6 (L) 03/05/2020 0428   GFRNONAA 26 (L) 03/07/2020 0659   GFRNONAA 24 (L) 03/06/2020 0325   GFRNONAA 22 (L) 03/05/2020 0428   GFRNONAA 17 (L) 01/13/2020 0716   GFRNONAA 19 (L) 01/06/2020 0751   GFRNONAA 32 (L) 12/06/2019 0736   GFRAA 20 (L) 01/13/2020 0716   GFRAA 22 (L) 01/06/2020 0751   GFRAA 37 (L) 12/06/2019 0736   CBC    Component Value Date/Time   WBC 7.9 03/07/2020 0659   RBC 3.10 (L) 03/07/2020 0659   HGB 9.4 (L) 03/07/2020 0659   HCT 29.2 (L) 03/07/2020 0659   PLT 573 (H) 03/07/2020 0659   MCV 94.2 03/07/2020 0659   MCH 30.3 03/07/2020 0659   MCHC 32.2 03/07/2020 0659   RDW 14.8 03/07/2020 0659   LYMPHSABS 0.6 (L) 03/07/2020 0659   MONOABS 0.7 03/07/2020 0659   EOSABS 0.1 03/07/2020 0659   BASOSABS 0.0 03/07/2020 0659   HEPATIC Function Panel Recent Labs    03/05/20 0428 03/06/20 0325 03/07/20 0659  PROT 8.4* 8.1 7.4  HEMOGLOBIN A1C No components found for: HGA1C,  MPG CARDIAC ENZYMES Lab Results  Component Value Date   CKTOTAL 122 09/02/2009   CKMB 1.0 09/02/2009   TROPONINI <0.03 06/25/2016   TROPONINI <0.03 06/25/2016   TROPONINI <0.03 06/24/2016   BNP No results for input(s): PROBNP in the last 8760 hours. TSH No results for input(s): TSH in the last 8760 hours. CHOLESTEROL Recent Labs    10/01/19 0803  CHOL 154    Scheduled Meds: . atorvastatin  40 mg Oral Daily  . bisacodyl  10 mg Rectal Daily  . Chlorhexidine Gluconate Cloth  6 each Topical Daily  . diclofenac Sodium  4 g Topical QID  . ferrous sulfate  325 mg Oral Q breakfast  . gabapentin  300 mg Oral TID  . insulin aspart  0-15 Units Subcutaneous TID WC  . insulin aspart   0-5 Units Subcutaneous QHS  . lip balm  1 application Topical BID  . metoprolol tartrate  12.5 mg Oral BID  . pantoprazole  40 mg Oral Daily  . sodium chloride flush  5 mL Intracatheter Q8H   Continuous Infusions: . ceFEPime (MAXIPIME) IV 2 g (03/07/20 0446)  . diltiazem (CARDIZEM) infusion 15 mg/hr (03/07/20 0441)  . heparin 900 Units/hr (03/06/20 1550)  . lactated ringers 150 mL/hr at 03/07/20 0851   PRN Meds:.albuterol, alum & mag hydroxide-simeth, docusate sodium, HYDROmorphone (DILAUDID) injection, magic mouthwash, menthol-cetylpyridinium, metoprolol tartrate, morphine, ondansetron (ZOFRAN) IV, phenol  Assessment/Plan: Atrial flutter/fib with controlled ventricular response Acute small bowel obstruction Acute pyelitis CAD Old MI Left hip septic arthritis Acute on chronic kidney injury Type 2 Dm with hyperglycemia Hypertension S/P CA of prostate S/P bilateral ureteric stents Anemia of chronic disease  Continue low dose beta blocker. Continue IV diltiazem   LOS: 4 days   Time spent including chart review, lab review, examination, discussion with patient/nurse : 30 min   Dixie Dials  MD  03/07/2020, 11:29 AM

## 2020-03-07 NOTE — Plan of Care (Signed)
  Problem: Activity: Goal: Risk for activity intolerance will decrease Outcome: Progressing   Problem: Nutrition: Goal: Adequate nutrition will be maintained Outcome: Progressing   Problem: Pain Managment: Goal: General experience of comfort will improve Outcome: Progressing   Problem: Education: Goal: Knowledge of General Education information will improve Description: Including pain rating scale, medication(s)/side effects and non-pharmacologic comfort measures Outcome: Completed/Met   

## 2020-03-07 NOTE — Progress Notes (Signed)
Pt has tolerated clear liquids since NG tube has been clamped. Dr. Ninfa Linden notified and order was written if pt  tolerates clear liquids for 8 hrs tube may be discontinued.  NG tube discontinued as per order. Yamil Oelke, Laurel Dimmer, RN

## 2020-03-07 NOTE — Progress Notes (Signed)
[place] for heparin Indication: atrial fibrillation  Allergies  Allergen Reactions  . Shellfish Allergy Rash    Patient Measurements: Height: 6\' 2"  (188 cm) Weight: 95.8 kg (211 lb 3.2 oz) (bedscale) IBW/kg (Calculated) : 82.2 Heparin Dosing Weight: 105 kg  Vital Signs: Temp: 98 F (36.7 C) (11/27 0500) Temp Source: Oral (11/27 0500) BP: 147/69 (11/27 0600) Pulse Rate: 73 (11/27 0600)  Labs: Recent Labs    03/04/20 1252 03/05/20 0428 03/05/20 0428 03/06/20 0325 03/06/20 0325 03/06/20 1245 03/06/20 2257 03/07/20 0659  HGB  --  10.3*   < > 9.9*  --   --   --  9.4*  HCT  --  32.1*  --  30.2*  --   --   --  29.2*  PLT  --  601*  --  605*  --   --   --  573*  LABPROT 13.9  --   --   --   --   --   --   --   INR 1.1  --   --   --   --   --   --   --   HEPARINUNFRC  --   --   --  0.94*   < > 1.02* 0.63 0.43  CREATININE  --  2.91*  --  2.70*  --   --   --  2.55*   < > = values in this interval not displayed.    Estimated Creatinine Clearance: 29.5 mL/min (A) (by C-G formula based on SCr of 2.55 mg/dL (H)).   Assessment: Patient is a 73 y.o M with hx afib (not on anticoag. PTA) presented to the ED from [facility] for management of septic left hip with plan for I&D on 03/09/20.  He was found to be in afib. Pharmacy is consulted to start heparin for afib.  Significant Events: - 11/24: IR placement of drainage cath into hip abscess  Today, 03/07/2020: - Heparin level is therapeutic at 0.43 with heparin drip running at 900 units/hr - Hgb down 9.4, plts high - Scr remains elevated but trending down - no bleeding documented  Goal of Therapy:  Heparin level 0.3-0.7 units/ml Monitor platelets by anticoagulation protocol: Yes   Plan:  - Continue heparin drip at 900 units/hr  - daily heparin level - monitor for s/sx bleeding - please advise when heparin drip needs to held for OR procedure on 11/29  Dia Sitter P PharmD,  BCPS 03/07/2020,10:51 AM

## 2020-03-07 NOTE — Progress Notes (Addendum)
PROGRESS NOTE    [name]  ATF:[id number] DOB: 1945-09-21 DOA: 03/03/2020 PCP: Aaron Nutting, DO   No chief complaint on file.  Brief Narrative:  [name] is [name] 74 y.o. male with medical history significant of coronary artery disease, diabetes type 2, hypertension, anemia, CKD stage IIIb, osteoarthritis status post left hip arthroplasty, renal stone/hydronephrosis status post stents, prostate cancer ,paroxysmal atrial flutter not on anticoagulation who was sent for direct admission to [facility] from [facility],Dr Aluisio.  Patient was seen in the emergency department on 03/01/2020 for left hip pain.  There was no report of injury.  He had CT left hip done which showed  fluid collection.  He was found to have elevated ESR and CRP.  He was discharged from the emergency department to follow-up with orthopedics as per orthopedics recommendation.   In the orthopedics office, he reported chills, nausea/diarrhea and increased left hip pain.  He was hardly able to walk.  He was afebrile.  Patient was requested for direct admission by orthopedics team for drainage or intervention of the left hip fluid collection.  Lab work also showed elevated ESR and CRP. Patient seen and examined at the bedside this evening.  During my evaluation, he was hemodynamically stable and overall comfortable.  He was hypertensive.  He complained of severe pain on his left hip and the left ear was found to be tender.  He denied any fever, chest pain, shortness of breath, cough, abdomen pain, nausea, vomiting, dysuria, hematochezia or melena.  He ives alone.  Assessment & Plan:   Active Problems:   OA (osteoarthritis) of hip   Type 2 diabetes mellitus with hypoglycemia without coma (HCC)   Essential hypertension   Hyperlipidemia with target LDL less than 70   Hip pain   Effusion of hip joint, left   Abdominal distension  Enterobacter Cloacae Bacteremia  Emphysematous Pyelitis  Left  Hip Septic Arthritis/Prosthetic Joint Infection CT left hip showed large fluid and air containing collection surroudning the proximal femoral component measuring up to 8.7 cm, concerning for infection/abscess.  Fluid and air collection track within the L adductor muscle compartment extending towards the symphysis.  Periprosthetic lucency along the posterior cortex of the proximal femoral stem extending distally by approximately 7.5 cm, concerning for septic loosening.  Area of lucency along the anterior aspect of the proximal native femur with air seen within bone, concerning for osteo. Orthopedic c/s, appreciate recs S/p CT guided placed 10 F drainage catheter placement  Culture with abundant enterobacter cloacae (resistant to cefazolin) Blood culture with 1 of 2 sets positive for enterobacter cloacae pending susceptibility Urine cx with enterobacter species (resistant to cefazolin) Ortho planning for surgery on Monday ID consult, appreciate recs -> recommending transition to cefepime, follow cultures, discuss with urology management of stents in light of bacteremia urology recommending continue abx, consider nephrostomy tube if new onset fevers/decreased uop/worsening creatinine  Partial SBO: s/p NG tube placement for diffusely gas distended bowel Follow CT abdomen pelvis -> tiny richter hernia containing [name] very short loop of small bowel with an associated early/partial SBO. KUB 11/26 with mildly dilated small bowel loops Surgery c/s, appreciate recs - recommending clamp NG and start clears, follow  Paroxysmal [name]. fib/atrial flutter: Follows with Dr [name].  Not on any anticoagulation at home because of anemia.  Was on anticoagulation in the past - will look into this, consider starting anticoagulation - discussed with pt.  On Coreg for rate control (on hold while  NPO).  IV metoprolol q6.  Some intermittent RVR chadvasc at leat 3. Appreciate cardiology assistance Transition to dilt gtt, low dose  oral metop, IV metop prn  Follow repeat echo - EF 55-60%, no RWMA (see report)  Diabetes type 2:Hemoglobin A1c of 7.1 as per 9/21. SSI - hold home amaryl and januvia Adjust prn  AKI on CKD stage IIIb  Anion Gap Metabolic Acidosis: His kidney function has fluctuated over several months from 1.5-3, unclear baseline.  Continue to monitor kidney function.  Avoid nephrotoxins.  He needs to follow-up with nephrology as an outpatient. Creatinine up to 2.91 on 11/25, follow with IVF (improving today) Acidosis resolved, transition to LR, continue to monitor  History of severe left hydroureteronephrosis/prostate cancer: Follows with urology.  He is status post bilateral ureteral stent placement by Dr. [name] 10/6.  Currently prostate cancer is in remission.  Normocytic anemia: Most likely associated  with chronic kidney disease, chronic medical problems.  Hemoglobin currently stable .  No report of hematochezia or melena.  Coronary artery disease: Takes aspirin.  Currently held for tomorrow's procedure. Denies any chest pain.    Hypertension:  Oral meds on hold with above.  Prn labetalol.  Hyperlipidemia: Takes Lipitor.  GERD: Continue Protonix  Left hip pain/debility: We will request for PT/OT evaluation after procedure.  DVT prophylaxis: heparin Code Status: full  Family Communication: none at bedside - daughter over phone Disposition:   Status is: inpatient  The patient will require care spanning > 2 midnights and should be moved to inpatient because: Inpatient level of care appropriate due to severity of illness  Dispo: The patient is from: Home              Anticipated d/c is to: pending              Anticipated d/c date is: > 3 days              Patient currently is not medically stable to d/c.   Consultants:   IR  Ortho  Procedures: IR IMPRESSION: Successful CT guided placement of [name] 12 French all purpose drain catheter into the complex fluid collection  involving the proximal lateral aspect the left thigh with aspiration of 50 cc of purulent fluid. Samples were sent to the laboratory as requested by the ordering clinical team.  Echo IMPRESSIONS    1. Left ventricular ejection fraction, by estimation, is 55 to 60%. The  left ventricle has normal function. The left ventricle has no regional  wall motion abnormalities. There is mild concentric left ventricular  hypertrophy. Left ventricular diastolic  function could not be evaluated.  2. Right ventricular systolic function is normal. The right ventricular  size is normal.  3. Left atrial size was mildly dilated.  4. The pericardial effusion is circumferential.  5. The mitral valve is normal in structure. Mild mitral valve  regurgitation. No evidence of mitral stenosis.  6. The aortic valve is normal in structure. Aortic valve regurgitation is  not visualized. Mild aortic valve sclerosis is present, with no evidence  of aortic valve stenosis.  7. The inferior vena cava is normal in size with greater than 50%  respiratory variability, suggesting right atrial pressure of 3 mmHg.   Comparison(s): No significant change from prior study. Prior images  reviewed side by side.  Antimicrobials: Anti-infectives (From admission, onward)   Start     Dose/Rate Route Frequency Ordered Stop   03/06/20 1700  ceFEPIme (MAXIPIME) 2 g in sodium chloride  0.9 % 100 mL IVPB        2 g 200 mL/hr over 30 Minutes Intravenous Every 12 hours 03/06/20 1340     03/05/20 1000  piperacillin-tazobactam (ZOSYN) IVPB 3.375 g  Status:  Discontinued        3.375 g 12.5 mL/hr over 240 Minutes Intravenous Every 8 hours 03/05/20 0904 03/06/20 1311      Subjective: Continued hip pain, didn't like ginger ale  Objective: Vitals:   03/07/20 0500 03/07/20 0600 03/07/20 1040 03/07/20 1300  BP: (!) 145/71 (!) 147/69 (!) 153/70 (!) 143/69  Pulse: 72 73 (!) 113 82  Resp: 17 14 (!) 23 14  Temp: 98 F (36.7 C)    97.8 F (36.6 C)  TempSrc: Oral   Oral  SpO2: 96% 92% 99%   Weight: 95.8 kg     Height:        Intake/Output Summary (Last 24 hours) at 03/07/2020 1523 Last data filed at 03/07/2020 1400 Gross per 24 hour  Intake 4635.21 ml  Output 1807.5 ml  Net 2827.71 ml   Filed Weights   03/03/20 1855 03/07/20 0500  Weight: 110.3 kg 95.8 kg    Examination:  General: No acute distress. Cardiovascular: RRR Lungs: unlabored Abdomen: distended, nontender, NG in place Neurological: Alert and oriented 3. Moves all extremities 4. Cranial nerves II through XII grossly intact. Skin: Warm and dry. No rashes or lesions. Extremities: LLE with drain in place   Data Reviewed: I have personally reviewed following labs and imaging studies  CBC: Recent Labs  Lab 03/01/20 0925 03/04/20 0537 03/05/20 0428 03/06/20 0325 03/07/20 0659  WBC 10.1 8.2 7.4 9.9 7.9  NEUTROABS 8.5*  --  6.4 8.3* 6.4  HGB 8.6* 9.8* 10.3* 9.9* 9.4*  HCT 26.3* 30.9* 32.1* 30.2* 29.2*  MCV 92.9 92.8 92.8 94.4 94.2  PLT 414* 596* 601* 605* 573*    Basic Metabolic Panel: Recent Labs  Lab 03/01/20 0925 03/04/20 0537 03/05/20 0428 03/06/20 0325 03/07/20 0659  NA 133* 135 138 138 140  K 4.1 4.7 4.6 4.3 3.9  CL 106 103 105 106 102  CO2 18* 18* 18* 16* 25  GLUCOSE 209* 198* 186* 191* 241*  BUN 59* 91* 117* 132* 131*  CREATININE 2.17* 2.20* 2.91* 2.70* 2.55*  CALCIUM 7.9* 8.6* 8.6* 8.4* 8.0*  MG  --   --  3.4* 3.3* 3.1*  PHOS  --   --  6.5* 6.3* 5.0*    GFR: Estimated Creatinine Clearance: 29.5 mL/min ([name]) (by C-G formula based on SCr of 2.55 mg/dL (H)).  Liver Function Tests: Recent Labs  Lab 03/05/20 0428 03/06/20 0325 03/07/20 0659  AST 14* 15 16  ALT '21 19 14  ' ALKPHOS 157* 143* 116  BILITOT 0.8 0.8 0.6  PROT 8.4* 8.1 7.4  ALBUMIN 2.6* 2.6* 2.4*    CBG: Recent Labs  Lab 03/06/20 1154 03/06/20 1746 03/06/20 2103 03/07/20 0744 03/07/20 1137  GLUCAP 182* 150* 169* 201* 156*     Recent  Results (from the past 240 hour(s))  Resp Panel by RT-PCR (Flu Jaala Bohle&B, Covid) Nasopharyngeal Swab     Status: None   Collection Time: 03/01/20 12:20 PM   Specimen: Nasopharyngeal Swab; Nasopharyngeal(NP) swabs in vial transport medium  Result Value Ref Range Status   SARS Coronavirus 2 by RT PCR NEGATIVE NEGATIVE Final    Comment: (NOTE) SARS-CoV-2 target nucleic acids are NOT DETECTED.  The SARS-CoV-2 RNA is generally detectable in upper respiratory specimens during the acute phase of  infection. The lowest concentration of SARS-CoV-2 viral copies this assay can detect is 138 copies/mL. [name] negative result does not preclude SARS-Cov-2 infection and should not be used as the sole basis for treatment or other patient management decisions. [name] negative result may occur with  improper specimen collection/handling, submission of specimen other than nasopharyngeal swab, presence of viral mutation(s) within the areas targeted by this assay, and inadequate number of viral copies(<138 copies/mL). [name] negative result must be combined with clinical observations, patient history, and epidemiological information. The expected result is Negative.  Fact Sheet for Patients:  EntrepreneurPulse.com.au  Fact Sheet for Healthcare Providers:  IncredibleEmployment.be  This test is no t yet approved or cleared by the [place] FDA and  has been authorized for detection and/or diagnosis of SARS-CoV-2 by FDA under an Emergency Use Authorization (EUA). This EUA will remain  in effect (meaning this test can be used) for the duration of the COVID-19 declaration under Section 564(b)(1) of the Act, 21 U.S.C.section 360bbb-3(b)(1), unless the authorization is terminated  or revoked sooner.       Influenza [name] by PCR NEGATIVE NEGATIVE Final   Influenza B by PCR NEGATIVE NEGATIVE Final    Comment: (NOTE) The Xpert Xpress SARS-CoV-2/FLU/RSV plus assay is intended as an aid in the  diagnosis of influenza from Nasopharyngeal swab specimens and should not be used as Symantha Steeber sole basis for treatment. Nasal washings and aspirates are unacceptable for Xpert Xpress SARS-CoV-2/FLU/RSV testing.  Fact Sheet for Patients: EntrepreneurPulse.com.au  Fact Sheet for Healthcare Providers: IncredibleEmployment.be  This test is not yet approved or cleared by the [place] FDA and has been authorized for detection and/or diagnosis of SARS-CoV-2 by FDA under an Emergency Use Authorization (EUA). This EUA will remain in effect (meaning this test can be used) for the duration of the COVID-19 declaration under Section 564(b)(1) of the Act, 21 U.S.C. section 360bbb-3(b)(1), unless the authorization is terminated or revoked.  Performed at [facility], [place] [address], [place], [place] [address]   Culture, blood (routine x 2)     Status: Abnormal (Preliminary result)   Collection Time: 03/03/20  7:04 PM   Specimen: BLOOD LEFT HAND  Result Value Ref Range Status   Specimen Description   Final    BLOOD LEFT HAND Performed at [place] [address] [place], [place] [address]    Special Requests   Final    BOTTLES DRAWN AEROBIC ONLY Blood Culture adequate volume Performed at [place] [address], [place], [place] [address]    Culture  Setup Time   Final    AEROBIC BOTTLE ONLY GRAM NEGATIVE RODS CRITICAL RESULT CALLED TO, READ BACK BY AND VERIFIED WITH: [name] PharmD 9:20 03/06/20 ([email or web address])    Culture (Ysmael Hires)  Final    ENTEROBACTER CLOACAE SUSCEPTIBILITIES TO FOLLOW Performed at [facility], [place] [address] [place], [place] [address]    Report Status PENDING  Incomplete  Blood Culture ID Panel (Reflexed)     Status: Abnormal   Collection Time: 03/03/20  7:04 PM  Result Value Ref Range Status   Enterococcus faecalis NOT DETECTED NOT DETECTED Final   Enterococcus  Faecium NOT DETECTED NOT DETECTED Final   Listeria monocytogenes NOT DETECTED NOT DETECTED Final   Staphylococcus species NOT DETECTED NOT DETECTED Final   Staphylococcus aureus (BCID) NOT DETECTED NOT DETECTED Final   Staphylococcus epidermidis NOT DETECTED NOT DETECTED Final   Staphylococcus lugdunensis NOT DETECTED NOT DETECTED Final  Streptococcus species NOT DETECTED NOT DETECTED Final   Streptococcus agalactiae NOT DETECTED NOT DETECTED Final   Streptococcus pneumoniae NOT DETECTED NOT DETECTED Final   Streptococcus pyogenes NOT DETECTED NOT DETECTED Final   Courtland Coppa.calcoaceticus-baumannii NOT DETECTED NOT DETECTED Final   Bacteroides fragilis NOT DETECTED NOT DETECTED Final   Enterobacterales DETECTED ([name]) NOT DETECTED Final    Comment: Enterobacterales represent Kaelem Brach large order of gram negative bacteria, not Adren Dollins single organism. CRITICAL RESULT CALLED TO, READ BACK BY AND VERIFIED WITH: [name] PharmD 9:20 03/06/20 ([email or web address])    Enterobacter cloacae complex DETECTED ([name]) NOT DETECTED Final    Comment: CRITICAL RESULT CALLED TO, READ BACK BY AND VERIFIED WITH: [name] PharmD 9:20 03/06/20 ([email or web address])    Escherichia coli NOT DETECTED NOT DETECTED Final   Klebsiella aerogenes NOT DETECTED NOT DETECTED Final   Klebsiella oxytoca NOT DETECTED NOT DETECTED Final   Klebsiella pneumoniae NOT DETECTED NOT DETECTED Final   Proteus species NOT DETECTED NOT DETECTED Final   Salmonella species NOT DETECTED NOT DETECTED Final   Serratia marcescens NOT DETECTED NOT DETECTED Final   Haemophilus influenzae NOT DETECTED NOT DETECTED Final   Neisseria meningitidis NOT DETECTED NOT DETECTED Final   Pseudomonas aeruginosa NOT DETECTED NOT DETECTED Final   Stenotrophomonas maltophilia NOT DETECTED NOT DETECTED Final   Candida albicans NOT DETECTED NOT DETECTED Final   Candida auris NOT DETECTED NOT DETECTED Final   Candida glabrata NOT DETECTED NOT DETECTED Final   Candida krusei NOT DETECTED NOT DETECTED  Final   Candida parapsilosis NOT DETECTED NOT DETECTED Final   Candida tropicalis NOT DETECTED NOT DETECTED Final   Cryptococcus neoformans/gattii NOT DETECTED NOT DETECTED Final   CTX-M ESBL NOT DETECTED NOT DETECTED Final   Carbapenem resistance IMP NOT DETECTED NOT DETECTED Final   Carbapenem resistance KPC NOT DETECTED NOT DETECTED Final   Carbapenem resistance NDM NOT DETECTED NOT DETECTED Final   Carbapenem resist OXA 48 LIKE NOT DETECTED NOT DETECTED Final   Carbapenem resistance VIM NOT DETECTED NOT DETECTED Final    Comment: Performed at [facility], [address] [place], [place] [address]  Culture, blood (routine x 2)     Status: None (Preliminary result)   Collection Time: 03/03/20  7:06 PM   Specimen: BLOOD RIGHT HAND  Result Value Ref Range Status   Specimen Description   Final    BLOOD RIGHT HAND Performed at [place] [address] [place], [place] [address]    Special Requests   Final    BOTTLES DRAWN AEROBIC ONLY Blood Culture adequate volume Performed at [place] [address], [place], [place] [address]    Culture   Final    NO GROWTH 4 DAYS Performed at [facility], [place] [address], [place], [place] [address]    Report Status PENDING  Incomplete  Aerobic/Anaerobic Culture (surgical/deep wound)     Status: None (Preliminary result)   Collection Time: 03/04/20  3:38 PM   Specimen: Abscess  Result Value Ref Range Status   Specimen Description   Final    ABSCESS DRAIN LEFT LATERAL THIGH Performed at [place] [address], [place], [place] [address]    Special Requests   Final    NONE Performed at [facility], [place] [address], [place], [place] [address]    Gram Stain   Final    ABUNDANT WBC PRESENT,BOTH PMN AND MONONUCLEAR ABUNDANT GRAM NEGATIVE RODS Performed at [facility], [place] [address], [place], [place] [address]    Culture   Final     ABUNDANT ENTEROBACTER CLOACAE NO ANAEROBES ISOLATED; CULTURE IN PROGRESS FOR 5 DAYS    Report Status PENDING  Incomplete   Organism ID, Bacteria ENTEROBACTER CLOACAE  Final      Susceptibility   Enterobacter cloacae - MIC*    CEFAZOLIN >=64 RESISTANT Resistant     CEFEPIME <=0.12 SENSITIVE Sensitive     CEFTAZIDIME <=1 SENSITIVE Sensitive     CIPROFLOXACIN <=0.25 SENSITIVE Sensitive     GENTAMICIN <=1 SENSITIVE Sensitive     IMIPENEM 0.5 SENSITIVE Sensitive     TRIMETH/SULFA <=20 SENSITIVE Sensitive     PIP/TAZO <=4 SENSITIVE Sensitive     * ABUNDANT ENTEROBACTER CLOACAE  Culture, Urine     Status: Abnormal   Collection Time: 03/05/20  8:10 AM   Specimen: Urine, Random  Result Value Ref Range Status   Specimen Description   Final    URINE, RANDOM Performed at [facility], [place] [address] [place], [place] [address]    Special Requests   Final    NONE Performed at [facility], [place] [address], [place], [place] [address]    Culture >=100,000 COLONIES/mL ENTEROBACTER SPECIES ([name])  Final   Report Status 03/07/2020 FINAL  Final   Organism ID, Bacteria ENTEROBACTER SPECIES ([name])  Final      Susceptibility   Enterobacter species - MIC*    CEFAZOLIN >=64 RESISTANT Resistant     CEFEPIME <=0.12 SENSITIVE Sensitive     CEFTRIAXONE <=0.25 SENSITIVE Sensitive     CIPROFLOXACIN <=0.25 SENSITIVE Sensitive     GENTAMICIN <=1 SENSITIVE Sensitive     IMIPENEM 0.5 SENSITIVE Sensitive     NITROFURANTOIN 32 SENSITIVE Sensitive     TRIMETH/SULFA <=20 SENSITIVE Sensitive     PIP/TAZO <=4 SENSITIVE Sensitive     * >=100,000 COLONIES/mL ENTEROBACTER SPECIES         Radiology Studies: DG Abd Portable 1V  Result Date: 03/06/2020 CLINICAL DATA:  Small-bowel obstruction. EXAM: PORTABLE ABDOMEN - 1 VIEW COMPARISON:  03/05/2020 abdominal radiographs and prior. FINDINGS: Indwelling enteric tube with tip and side hole overlying the gastric body. Increased  conspicuity of gas-filled mildly dilated small bowel loops. Residual enteric contrast opacifies nondilated ascending colon. Bilateral double-J ureteral stents are unchanged. Sequela of bilateral hip arthroplasties. Partially imaged left hip surgical drain. IMPRESSION: Mildly dilated small bowel loops, more conspicuous than prior exam. Indwelling enteric tube and bilateral ureteral stents, unchanged. Electronically Signed   By: Primitivo Gauze M.D.   On: 03/06/2020 10:02   ECHOCARDIOGRAM COMPLETE  Result Date: 03/06/2020    ECHOCARDIOGRAM REPORT   Patient Name:   JESUS NEVILLS Date of Exam: 03/06/2020 Medical Rec #:  [id number]        Height:       74.0 in Accession #:    [id number]       Weight:       243.2 lb Date of Birth:  [birth year]        BSA:          2.362 m Patient Age:    55 years         BP:           171/78 mmHg Patient Gender: M                HR:           86 bpm. Exam  Location:  Inpatient Procedure: 2D Echo, Cardiac Doppler and Color Doppler Indications:    I48.91* Unspeicified atrial fibrillation  History:        Patient has prior history of Echocardiogram examinations, most                 recent 06/24/2016. Previous Myocardial Infarction and CAD,                 Abnormal ECG, Arrythmias:Atrial Fibrillation,                 Signs/Symptoms:Chest Pain; Risk Factors:Diabetes, Former Smoker,                 Hypertension and Dyslipidemia. Cancer.  Sonographer:    Roseanna Rainbow RDCS Referring Phys: [phone] Padme Arriaga CALDWELL POWELL JR  Sonographer Comments: Suboptimal parasternal window and suboptimal subcostal window. IMPRESSIONS  1. Left ventricular ejection fraction, by estimation, is 55 to 60%. The left ventricle has normal function. The left ventricle has no regional wall motion abnormalities. There is mild concentric left ventricular hypertrophy. Left ventricular diastolic function could not be evaluated.  2. Right ventricular systolic function is normal. The right ventricular size is normal.  3. Left  atrial size was mildly dilated.  4. The pericardial effusion is circumferential.  5. The mitral valve is normal in structure. Mild mitral valve regurgitation. No evidence of mitral stenosis.  6. The aortic valve is normal in structure. Aortic valve regurgitation is not visualized. Mild aortic valve sclerosis is present, with no evidence of aortic valve stenosis.  7. The inferior vena cava is normal in size with greater than 50% respiratory variability, suggesting right atrial pressure of 3 mmHg. Comparison(s): No significant change from prior study. Prior images reviewed side by side. FINDINGS  Left Ventricle: Left ventricular ejection fraction, by estimation, is 55 to 60%. The left ventricle has normal function. The left ventricle has no regional wall motion abnormalities. The left ventricular internal cavity size was normal in size. There is  mild concentric left ventricular hypertrophy. Left ventricular diastolic function could not be evaluated due to atrial fibrillation. Left ventricular diastolic function could not be evaluated. Right Ventricle: The right ventricular size is normal. No increase in right ventricular wall thickness. Right ventricular systolic function is normal. Left Atrium: Left atrial size was mildly dilated. Right Atrium: Right atrial size was normal in size. Pericardium: Trivial pericardial effusion is present. The pericardial effusion is circumferential. Mitral Valve: The mitral valve is normal in structure. Mild mitral annular calcification. Mild mitral valve regurgitation, with centrally-directed jet. No evidence of mitral valve stenosis. Tricuspid Valve: The tricuspid valve is normal in structure. Tricuspid valve regurgitation is not demonstrated. No evidence of tricuspid stenosis. Aortic Valve: The aortic valve is normal in structure. Aortic valve regurgitation is not visualized. Mild aortic valve sclerosis is present, with no evidence of aortic valve stenosis. Pulmonic Valve: The  pulmonic valve was not well visualized. Pulmonic valve regurgitation is not visualized. No evidence of pulmonic stenosis. Aorta: The aortic root is normal in size and structure. Venous: The inferior vena cava is normal in size with greater than 50% respiratory variability, suggesting right atrial pressure of 3 mmHg. IAS/Shunts: No atrial level shunt detected by color flow Doppler.  LEFT VENTRICLE PLAX 2D LVIDd:         4.00 cm      Diastology LVIDs:         2.80 cm      LV e' medial:    13.50 cm/s  LV PW:         1.60 cm      LV E/e' medial:  6.0 LV IVS:        1.50 cm      LV e' lateral:   17.20 cm/s LVOT diam:     1.90 cm      LV E/e' lateral: 4.7 LV SV:         54 LV SV Index:   23 LVOT Area:     2.84 cm  LV Volumes (MOD) LV vol d, MOD A2C: 58.0 ml LV vol d, MOD A4C: 107.0 ml LV vol s, MOD A2C: 29.0 ml LV vol s, MOD A4C: 41.2 ml LV SV MOD A2C:     29.0 ml LV SV MOD A4C:     107.0 ml LV SV MOD BP:      47.0 ml RIGHT VENTRICLE         IVC TAPSE (M-mode): 2.7 cm  IVC diam: 1.70 cm LEFT ATRIUM             Index       RIGHT ATRIUM           Index LA diam:        3.70 cm 1.57 cm/m  RA Area:     15.30 cm LA Vol (A2C):   39.4 ml 16.68 ml/m RA Volume:   37.00 ml  15.67 ml/m LA Vol (A4C):   58.0 ml 24.56 ml/m LA Biplane Vol: 49.4 ml 20.92 ml/m  AORTIC VALVE LVOT Vmax:   110.00 cm/s LVOT Vmean:  75.100 cm/s LVOT VTI:    0.192 m  AORTA Ao Root diam: 3.30 cm MITRAL VALVE MV Area (PHT): 4.60 cm    SHUNTS MV Decel Time: 165 msec    Systemic VTI:  0.19 m MV E velocity: 81.40 cm/s  Systemic Diam: 1.90 cm Dani Gobble Croitoru MD Electronically signed by Sanda Klein MD Signature Date/Time: 03/06/2020/2:21:39 PM    Final         Scheduled Meds: . atorvastatin  40 mg Oral Daily  . bisacodyl  10 mg Rectal Daily  . Chlorhexidine Gluconate Cloth  6 each Topical Daily  . diclofenac Sodium  4 g Topical QID  . ferrous sulfate  325 mg Oral Q breakfast  . gabapentin  300 mg Oral TID  . insulin aspart  0-15 Units  Subcutaneous TID WC  . insulin aspart  0-5 Units Subcutaneous QHS  . lip balm  1 application Topical BID  . metoprolol tartrate  12.5 mg Oral BID  . pantoprazole  40 mg Oral Daily  . sodium chloride flush  5 mL Intracatheter Q8H   Continuous Infusions: . ceFEPime (MAXIPIME) IV 2 g (03/07/20 0446)  . diltiazem (CARDIZEM) infusion 15 mg/hr (03/07/20 1213)  . heparin 900 Units/hr (03/06/20 1550)  . lactated ringers 150 mL/hr at 03/07/20 0851     LOS: 4 days    Time spent: over 30 min    Fayrene Helper, MD [facility]   To contact the attending provider between 7A-7P or the covering provider during after hours 7P-7A, please log into the web site [email or web address] and access using universal [place] password for that web site. If you do not have the password, please call the hospital operator.  03/07/2020, 3:23 PM

## 2020-03-07 NOTE — Progress Notes (Signed)
Subjective/Chief Complaint: Had bm and passing flatus   Objective: Vital signs in last 24 hours: Temp:  [97.5 F (36.4 C)-98.2 F (36.8 C)] 98 F (36.7 C) (11/27 0500) Pulse Rate:  [48-102] 73 (11/27 0600) Resp:  [11-29] 14 (11/27 0600) BP: (127-171)/(59-82) 147/69 (11/27 0600) SpO2:  [92 %-100 %] 92 % (11/27 0600) Weight:  [95.8 kg] 95.8 kg (11/27 0500) Last BM Date: 03/06/20  Intake/Output from previous day: 11/26 0701 - 11/27 0700 In: 2748.9 [I.V.:2548.9; IV Piggyback:200] Out: 1732.5 [Urine:1525; Emesis/NG output:200; Drains:7.5] Intake/Output this shift: No intake/output data recorded.  Exam: Awake and alert Abdomen less distended, softer, non-tender  Lab Results:  Recent Labs    03/06/20 0325 03/07/20 0659  WBC 9.9 7.9  HGB 9.9* 9.4*  HCT 30.2* 29.2*  PLT 605* 573*   BMET Recent Labs    03/06/20 0325 03/07/20 0659  NA 138 140  K 4.3 3.9  CL 106 102  CO2 16* 25  GLUCOSE 191* 241*  BUN 132* PENDING  CREATININE 2.70* 2.55*  CALCIUM 8.4* 8.0*   PT/INR Recent Labs    03/04/20 1252  LABPROT 13.9  INR 1.1   ABG No results for input(s): PHART, HCO3 in the last 72 hours.  Invalid input(s): PCO2, PO2  Studies/Results: DG Abd Portable 1V  Result Date: 03/06/2020 CLINICAL DATA:  Small-bowel obstruction. EXAM: PORTABLE ABDOMEN - 1 VIEW COMPARISON:  03/05/2020 abdominal radiographs and prior. FINDINGS: Indwelling enteric tube with tip and side hole overlying the gastric body. Increased conspicuity of gas-filled mildly dilated small bowel loops. Residual enteric contrast opacifies nondilated ascending colon. Bilateral double-J ureteral stents are unchanged. Sequela of bilateral hip arthroplasties. Partially imaged left hip surgical drain. IMPRESSION: Mildly dilated small bowel loops, more conspicuous than prior exam. Indwelling enteric tube and bilateral ureteral stents, unchanged. Electronically Signed   By: Primitivo Gauze M.D.   On: 03/06/2020  10:02   DG Abd Portable 1V-Small Bowel Obstruction Protocol-initial, 8 hr delay  Result Date: 03/05/2020 CLINICAL DATA:  SBO EXAM: PORTABLE ABDOMEN - 1 VIEW COMPARISON:  March 04, 2020 FINDINGS: Persistent gaseous dilation of loops of small bowel. Small bowel loop measures up to 5.2 cm, similar in comparison to prior. There is enteric contrast within a dilated loop of proximal small bowel. Enteric tube is coiled within the stomach with tip terminating over the distal stomach. Bilateral double-J stents. LEFT-sided surgical drain overlying the LEFT lateral leg. Soft tissue air of the LEFT lateral leg. Status post bilateral hip arthroplasty. Degenerative changes of the lower lumbar spine. IMPRESSION: Persistent gaseous dilation of loops of small bowel, similar in comparison to prior. Enteric contrast is within the dilated proximal small bowel. Electronically Signed   By: Valentino Saxon MD   On: 03/05/2020 11:12   ECHOCARDIOGRAM COMPLETE  Result Date: 03/06/2020    ECHOCARDIOGRAM REPORT   Patient Name:   [name] Date of Exam: 03/06/2020 Medical Rec #:  [id number]        Height:       74.0 in Accession #:    [id number]       Weight:       243.2 lb Date of Birth:  1945-12-15        BSA:          2.362 m Patient Age:    74 years         BP:           171/78 mmHg Patient Gender: M  HR:           86 bpm. Exam Location:  Inpatient Procedure: 2D Echo, Cardiac Doppler and Color Doppler Indications:    I48.91* Unspeicified atrial fibrillation  History:        Patient has prior history of Echocardiogram examinations, most                 recent 06/24/2016. Previous Myocardial Infarction and CAD,                 Abnormal ECG, Arrythmias:Atrial Fibrillation,                 Signs/Symptoms:Chest Pain; Risk Factors:Diabetes, Former Smoker,                 Hypertension and Dyslipidemia. Cancer.  Sonographer:    Roseanna Rainbow RDCS Referring Phys: [phone] A CALDWELL POWELL JR  Sonographer Comments:  Suboptimal parasternal window and suboptimal subcostal window. IMPRESSIONS  1. Left ventricular ejection fraction, by estimation, is 55 to 60%. The left ventricle has normal function. The left ventricle has no regional wall motion abnormalities. There is mild concentric left ventricular hypertrophy. Left ventricular diastolic function could not be evaluated.  2. Right ventricular systolic function is normal. The right ventricular size is normal.  3. Left atrial size was mildly dilated.  4. The pericardial effusion is circumferential.  5. The mitral valve is normal in structure. Mild mitral valve regurgitation. No evidence of mitral stenosis.  6. The aortic valve is normal in structure. Aortic valve regurgitation is not visualized. Mild aortic valve sclerosis is present, with no evidence of aortic valve stenosis.  7. The inferior vena cava is normal in size with greater than 50% respiratory variability, suggesting right atrial pressure of 3 mmHg. Comparison(s): No significant change from prior study. Prior images reviewed side by side. FINDINGS  Left Ventricle: Left ventricular ejection fraction, by estimation, is 55 to 60%. The left ventricle has normal function. The left ventricle has no regional wall motion abnormalities. The left ventricular internal cavity size was normal in size. There is  mild concentric left ventricular hypertrophy. Left ventricular diastolic function could not be evaluated due to atrial fibrillation. Left ventricular diastolic function could not be evaluated. Right Ventricle: The right ventricular size is normal. No increase in right ventricular wall thickness. Right ventricular systolic function is normal. Left Atrium: Left atrial size was mildly dilated. Right Atrium: Right atrial size was normal in size. Pericardium: Trivial pericardial effusion is present. The pericardial effusion is circumferential. Mitral Valve: The mitral valve is normal in structure. Mild mitral annular  calcification. Mild mitral valve regurgitation, with centrally-directed jet. No evidence of mitral valve stenosis. Tricuspid Valve: The tricuspid valve is normal in structure. Tricuspid valve regurgitation is not demonstrated. No evidence of tricuspid stenosis. Aortic Valve: The aortic valve is normal in structure. Aortic valve regurgitation is not visualized. Mild aortic valve sclerosis is present, with no evidence of aortic valve stenosis. Pulmonic Valve: The pulmonic valve was not well visualized. Pulmonic valve regurgitation is not visualized. No evidence of pulmonic stenosis. Aorta: The aortic root is normal in size and structure. Venous: The inferior vena cava is normal in size with greater than 50% respiratory variability, suggesting right atrial pressure of 3 mmHg. IAS/Shunts: No atrial level shunt detected by color flow Doppler.  LEFT VENTRICLE PLAX 2D LVIDd:         4.00 cm      Diastology LVIDs:         2.80  cm      LV e' medial:    13.50 cm/s LV PW:         1.60 cm      LV E/e' medial:  6.0 LV IVS:        1.50 cm      LV e' lateral:   17.20 cm/s LVOT diam:     1.90 cm      LV E/e' lateral: 4.7 LV SV:         54 LV SV Index:   23 LVOT Area:     2.84 cm  LV Volumes (MOD) LV vol d, MOD A2C: 58.0 ml LV vol d, MOD A4C: 107.0 ml LV vol s, MOD A2C: 29.0 ml LV vol s, MOD A4C: 41.2 ml LV SV MOD A2C:     29.0 ml LV SV MOD A4C:     107.0 ml LV SV MOD BP:      47.0 ml RIGHT VENTRICLE         IVC TAPSE (M-mode): 2.7 cm  IVC diam: 1.70 cm LEFT ATRIUM             Index       RIGHT ATRIUM           Index LA diam:        3.70 cm 1.57 cm/m  RA Area:     15.30 cm LA Vol (A2C):   39.4 ml 16.68 ml/m RA Volume:   37.00 ml  15.67 ml/m LA Vol (A4C):   58.0 ml 24.56 ml/m LA Biplane Vol: 49.4 ml 20.92 ml/m  AORTIC VALVE LVOT Vmax:   110.00 cm/s LVOT Vmean:  75.100 cm/s LVOT VTI:    0.192 m  AORTA Ao Root diam: 3.30 cm MITRAL VALVE MV Area (PHT): 4.60 cm    SHUNTS MV Decel Time: 165 msec    Systemic VTI:  0.19 m MV E  velocity: 81.40 cm/s  Systemic Diam: 1.90 cm Dani Gobble Croitoru MD Electronically signed by Sanda Klein MD Signature Date/Time: 03/06/2020/2:21:39 PM    Final     Anti-infectives: Anti-infectives (From admission, onward)   Start     Dose/Rate Route Frequency Ordered Stop   03/06/20 1700  ceFEPIme (MAXIPIME) 2 g in sodium chloride 0.9 % 100 mL IVPB        2 g 200 mL/hr over 30 Minutes Intravenous Every 12 hours 03/06/20 1340     03/05/20 1000  piperacillin-tazobactam (ZOSYN) IVPB 3.375 g  Status:  Discontinued        3.375 g 12.5 mL/hr over 240 Minutes Intravenous Every 8 hours 03/05/20 0904 03/06/20 1311      Assessment/Plan: Ileus  Small bowel protocol films now show contrast in the colon and pt passing flatus Will clamp NG and start clear liquids.  If tolerated, will d/c NG later today  LOS: 4 days    Coralie Keens 03/07/2020

## 2020-03-07 NOTE — Progress Notes (Signed)
Subjective:  Patient reports pain as moderate.  Denies N/V/CP/SOB. Wants to drink water  Objective:   VITALS:   Vitals:   03/07/20 0300 03/07/20 0400 03/07/20 0500 03/07/20 0600  BP: (!) 162/59 (!) 151/66 (!) 145/71 (!) 147/69  Pulse: (!) 48 99 72 73  Resp: (!) 29 (!) 23 17 14   Temp:   98 F (36.7 C)   TempSrc:   Oral   SpO2: 97% 99% 96% 92%  Weight:   95.8 kg   Height:        NAD ABD soft Sensation intact distally Intact pulses distally Dorsiflexion/Plantar flexion intact Incision: no drainage Compartment soft JP scant seropurulent drainage  Lab Results  Component Value Date   WBC 7.9 03/07/2020   HGB 9.4 (L) 03/07/2020   HCT 29.2 (L) 03/07/2020   MCV 94.2 03/07/2020   PLT 573 (H) 03/07/2020   BMET    Component Value Date/Time   NA 140 03/07/2020 0659   NA 138 10/01/2019 0803   K 3.9 03/07/2020 0659   CL 102 03/07/2020 0659   CO2 25 03/07/2020 0659   GLUCOSE 241 (H) 03/07/2020 0659   BUN PENDING 03/07/2020 0659   BUN 27 (A) 10/01/2019 0803   CREATININE 2.55 (H) 03/07/2020 0659   CREATININE 1.63 (H) 01/21/2020 0953   CALCIUM 8.0 (L) 03/07/2020 0659   GFRNONAA 26 (L) 03/07/2020 0659   GFRNONAA 17 (L) 01/13/2020 0716   GFRAA 20 (L) 01/13/2020 0716   Recent Results (from the past 240 hour(s))  Resp Panel by RT-PCR (Flu A&B, Covid) Nasopharyngeal Swab     Status: None   Collection Time: 03/01/20 12:20 PM   Specimen: Nasopharyngeal Swab; Nasopharyngeal(NP) swabs in vial transport medium  Result Value Ref Range Status   SARS Coronavirus 2 by RT PCR NEGATIVE NEGATIVE Final    Comment: (NOTE) SARS-CoV-2 target nucleic acids are NOT DETECTED.  The SARS-CoV-2 RNA is generally detectable in upper respiratory specimens during the acute phase of infection. The lowest concentration of SARS-CoV-2 viral copies this assay can detect is 138 copies/mL. A negative result does not preclude SARS-Cov-2 infection and should not be used as the sole basis for treatment  or other patient management decisions. A negative result may occur with  improper specimen collection/handling, submission of specimen other than nasopharyngeal swab, presence of viral mutation(s) within the areas targeted by this assay, and inadequate number of viral copies(<138 copies/mL). A negative result must be combined with clinical observations, patient history, and epidemiological information. The expected result is Negative.  Fact Sheet for Patients:  EntrepreneurPulse.com.au  Fact Sheet for Healthcare Providers:  IncredibleEmployment.be  This test is no t yet approved or cleared by the [place] FDA and  has been authorized for detection and/or diagnosis of SARS-CoV-2 by FDA under an Emergency Use Authorization (EUA). This EUA will remain  in effect (meaning this test can be used) for the duration of the COVID-19 declaration under Section 564(b)(1) of the Act, 21 U.S.C.section 360bbb-3(b)(1), unless the authorization is terminated  or revoked sooner.       Influenza A by PCR NEGATIVE NEGATIVE Final   Influenza B by PCR NEGATIVE NEGATIVE Final    Comment: (NOTE) The Xpert Xpress SARS-CoV-2/FLU/RSV plus assay is intended as an aid in the diagnosis of influenza from Nasopharyngeal swab specimens and should not be used as a sole basis for treatment. Nasal washings and aspirates are unacceptable for Xpert Xpress SARS-CoV-2/FLU/RSV testing.  Fact Sheet for Patients: EntrepreneurPulse.com.au  Fact Sheet  for Healthcare Providers: IncredibleEmployment.be  This test is not yet approved or cleared by the [place] and has been authorized for detection and/or diagnosis of SARS-CoV-2 by FDA under an Emergency Use Authorization (EUA). This EUA will remain in effect (meaning this test can be used) for the duration of the COVID-19 declaration under Section 564(b)(1) of the Act, 21 U.S.C. section  360bbb-3(b)(1), unless the authorization is terminated or revoked.  Performed at [facility], [place] [address], [place], [place] [address]   Culture, blood (routine x 2)     Status: None (Preliminary result)   Collection Time: 03/03/20  7:04 PM   Specimen: BLOOD LEFT HAND  Result Value Ref Range Status   Specimen Description   Final    BLOOD LEFT HAND Performed at [facility] [address] [place], [place] [address]    Special Requests   Final    BOTTLES DRAWN AEROBIC ONLY Blood Culture adequate volume Performed at [facility] [address] [place], [place] [address]    Culture  Setup Time   Final    AEROBIC BOTTLE ONLY GRAM NEGATIVE RODS CRITICAL RESULT CALLED TO, READ BACK BY AND VERIFIED WITH: Ulice Bold PharmD 9:20 03/06/20 ([email or web address])    Culture   Final    CULTURE REINCUBATED FOR BETTER GROWTH Performed at [facility], [place] [address], [place], [place] [address]    Report Status PENDING  Incomplete  Blood Culture ID Panel (Reflexed)     Status: Abnormal   Collection Time: 03/03/20  7:04 PM  Result Value Ref Range Status   Enterococcus faecalis NOT DETECTED NOT DETECTED Final   Enterococcus Faecium NOT DETECTED NOT DETECTED Final   Listeria monocytogenes NOT DETECTED NOT DETECTED Final   Staphylococcus species NOT DETECTED NOT DETECTED Final   Staphylococcus aureus (BCID) NOT DETECTED NOT DETECTED Final   Staphylococcus epidermidis NOT DETECTED NOT DETECTED Final   Staphylococcus lugdunensis NOT DETECTED NOT DETECTED Final   Streptococcus species NOT DETECTED NOT DETECTED Final   Streptococcus agalactiae NOT DETECTED NOT DETECTED Final   Streptococcus pneumoniae NOT DETECTED NOT DETECTED Final   Streptococcus pyogenes NOT DETECTED NOT DETECTED Final   A.calcoaceticus-baumannii NOT DETECTED NOT DETECTED Final   Bacteroides fragilis NOT DETECTED NOT DETECTED Final   Enterobacterales DETECTED (A) NOT  DETECTED Final    Comment: Enterobacterales represent a large order of gram negative bacteria, not a single organism. CRITICAL RESULT CALLED TO, READ BACK BY AND VERIFIED WITH: Ulice Bold PharmD 9:20 03/06/20 ([email or web address])    Enterobacter cloacae complex DETECTED (A) NOT DETECTED Final    Comment: CRITICAL RESULT CALLED TO, READ BACK BY AND VERIFIED WITH: Ulice Bold PharmD 9:20 03/06/20 ([email or web address])    Escherichia coli NOT DETECTED NOT DETECTED Final   Klebsiella aerogenes NOT DETECTED NOT DETECTED Final   Klebsiella oxytoca NOT DETECTED NOT DETECTED Final   Klebsiella pneumoniae NOT DETECTED NOT DETECTED Final   Proteus species NOT DETECTED NOT DETECTED Final   Salmonella species NOT DETECTED NOT DETECTED Final   Serratia marcescens NOT DETECTED NOT DETECTED Final   Haemophilus influenzae NOT DETECTED NOT DETECTED Final   Neisseria meningitidis NOT DETECTED NOT DETECTED Final   Pseudomonas aeruginosa NOT DETECTED NOT DETECTED Final   Stenotrophomonas maltophilia NOT DETECTED NOT DETECTED Final   Candida albicans NOT DETECTED NOT DETECTED Final   Candida auris NOT DETECTED NOT DETECTED Final   Candida glabrata NOT DETECTED NOT DETECTED Final   Candida krusei NOT DETECTED  NOT DETECTED Final   Candida parapsilosis NOT DETECTED NOT DETECTED Final   Candida tropicalis NOT DETECTED NOT DETECTED Final   Cryptococcus neoformans/gattii NOT DETECTED NOT DETECTED Final   CTX-M ESBL NOT DETECTED NOT DETECTED Final   Carbapenem resistance IMP NOT DETECTED NOT DETECTED Final   Carbapenem resistance KPC NOT DETECTED NOT DETECTED Final   Carbapenem resistance NDM NOT DETECTED NOT DETECTED Final   Carbapenem resist OXA 48 LIKE NOT DETECTED NOT DETECTED Final   Carbapenem resistance VIM NOT DETECTED NOT DETECTED Final    Comment: Performed at [facility], [place] [address], [place], [place] [address]  Culture, blood (routine x 2)     Status: None (Preliminary result)   Collection Time: 03/03/20  7:06 PM     Specimen: BLOOD RIGHT HAND  Result Value Ref Range Status   Specimen Description   Final    BLOOD RIGHT HAND Performed at [facility] [address] [place], [place] [address]    Special Requests   Final    BOTTLES DRAWN AEROBIC ONLY Blood Culture adequate volume Performed at [facility] [address], [place], [place] [address]    Culture   Final    NO GROWTH 4 DAYS Performed at [facility], [place] [address] [place], [place] [address]    Report Status PENDING  Incomplete  Aerobic/Anaerobic Culture (surgical/deep wound)     Status: None (Preliminary result)   Collection Time: 03/04/20  3:38 PM   Specimen: Abscess  Result Value Ref Range Status   Specimen Description   Final    ABSCESS DRAIN LEFT LATERAL THIGH Performed at [facility] [address], [place], [place] [address]    Special Requests   Final    NONE Performed at [facility], [place] [address] [place], [place] [address]    Gram Stain   Final    ABUNDANT WBC PRESENT,BOTH PMN AND MONONUCLEAR ABUNDANT GRAM NEGATIVE RODS Performed at [facility], [place] [address] [place], [place] [address]    Culture   Final    ABUNDANT ENTEROBACTER CLOACAE NO ANAEROBES ISOLATED; CULTURE IN PROGRESS FOR 5 DAYS    Report Status PENDING  Incomplete   Organism ID, Bacteria ENTEROBACTER CLOACAE  Final      Susceptibility   Enterobacter cloacae - MIC*    CEFAZOLIN >=64 RESISTANT Resistant     CEFEPIME <=0.12 SENSITIVE Sensitive     CEFTAZIDIME <=1 SENSITIVE Sensitive     CIPROFLOXACIN <=0.25 SENSITIVE Sensitive     GENTAMICIN <=1 SENSITIVE Sensitive     IMIPENEM 0.5 SENSITIVE Sensitive     TRIMETH/SULFA <=20 SENSITIVE Sensitive     PIP/TAZO <=4 SENSITIVE Sensitive     * ABUNDANT ENTEROBACTER CLOACAE  Culture, Urine     Status: Abnormal (Preliminary result)   Collection Time: 03/05/20  8:10 AM   Specimen: Urine, Random  Result Value  Ref Range Status   Specimen Description   Final    URINE, RANDOM Performed at [facility], [place] [address], [place], [place] [address]    Special Requests   Final    NONE Performed at [facility], [place] [address], [place], [place] [address]    Culture (A)  Final    >=100,000 COLONIES/mL GRAM NEGATIVE RODS SUSCEPTIBILITIES TO FOLLOW Performed at [facility], [place] [address] [place], [place] [address]    Report Status PENDING  Incomplete  Assessment/Plan:     Active Problems:   OA (osteoarthritis) of hip   Type 2 diabetes mellitus with hypoglycemia without coma (HCC)   Essential hypertension   Hyperlipidemia with target LDL less than 70   Hip pain   Effusion of hip joint, left   Abdominal distension   ABX per ID Cont perc drain Dispo: surgery Monday with Dr. Wynelle Link if medically clear   Hilton Cork Totiana Everson 03/07/2020, 8:42 AM   Rod Can, MD [phone] [place] is now [facility]  [place] [address] [place] [address], [place], [place] [address] Phone: [phone] [email or web address] Facebook  [employer]

## 2020-03-08 ENCOUNTER — Inpatient Hospital Stay (HOSPITAL_COMMUNITY): Payer: HMO

## 2020-03-08 DIAGNOSIS — R7881 Bacteremia: Secondary | ICD-10-CM | POA: Diagnosis not present

## 2020-03-08 DIAGNOSIS — M25452 Effusion, left hip: Secondary | ICD-10-CM | POA: Diagnosis not present

## 2020-03-08 LAB — CULTURE, BLOOD (ROUTINE X 2)
Culture: NO GROWTH
Special Requests: ADEQUATE
Special Requests: ADEQUATE

## 2020-03-08 LAB — COMPREHENSIVE METABOLIC PANEL
ALT: 17 U/L (ref 0–44)
AST: 22 U/L (ref 15–41)
Albumin: 2.4 g/dL — ABNORMAL LOW (ref 3.5–5.0)
Alkaline Phosphatase: 101 U/L (ref 38–126)
Anion gap: 13 (ref 5–15)
BUN: 87 mg/dL — ABNORMAL HIGH (ref 8–23)
CO2: 26 mmol/L (ref 22–32)
Calcium: 8.2 mg/dL — ABNORMAL LOW (ref 8.9–10.3)
Chloride: 101 mmol/L (ref 98–111)
Creatinine, Ser: 1.82 mg/dL — ABNORMAL HIGH (ref 0.61–1.24)
GFR, Estimated: 38 mL/min — ABNORMAL LOW (ref >60)
Glucose, Bld: 158 mg/dL — ABNORMAL HIGH (ref 70–99)
Potassium: 4.1 mmol/L (ref 3.5–5.1)
Sodium: 140 mmol/L (ref 135–145)
Total Bilirubin: 0.5 mg/dL (ref 0.3–1.2)
Total Protein: 7.3 g/dL (ref 6.5–8.1)

## 2020-03-08 LAB — CBC WITH DIFFERENTIAL/PLATELET
Abs Immature Granulocytes: 0.06 10*3/uL (ref 0.00–0.07)
Basophils Absolute: 0 10*3/uL (ref 0.0–0.1)
Basophils Relative: 0 %
Eosinophils Absolute: 0.1 10*3/uL (ref 0.0–0.5)
Eosinophils Relative: 1 %
HCT: 28.4 % — ABNORMAL LOW (ref 39.0–52.0)
Hemoglobin: 8.8 g/dL — ABNORMAL LOW (ref 13.0–17.0)
Immature Granulocytes: 1 %
Lymphocytes Relative: 10 %
Lymphs Abs: 0.8 10*3/uL (ref 0.7–4.0)
MCH: 29.8 pg (ref 26.0–34.0)
MCHC: 31 g/dL (ref 30.0–36.0)
MCV: 96.3 fL (ref 80.0–100.0)
Monocytes Absolute: 0.6 10*3/uL (ref 0.1–1.0)
Monocytes Relative: 8 %
Neutro Abs: 5.8 10*3/uL (ref 1.7–7.7)
Neutrophils Relative %: 80 %
Platelets: 467 10*3/uL — ABNORMAL HIGH (ref 150–400)
RBC: 2.95 MIL/uL — ABNORMAL LOW (ref 4.22–5.81)
RDW: 14.6 % (ref 11.5–15.5)
WBC: 7.3 10*3/uL (ref 4.0–10.5)
nRBC: 0 % (ref 0.0–0.2)

## 2020-03-08 LAB — HEPARIN LEVEL (UNFRACTIONATED)
Heparin Unfractionated: <0.1 IU/mL — ABNORMAL LOW (ref 0.30–0.70)
Heparin Unfractionated: 0.16 IU/mL — ABNORMAL LOW (ref 0.30–0.70)
Heparin Unfractionated: 0.25 IU/mL — ABNORMAL LOW (ref 0.30–0.70)

## 2020-03-08 LAB — GLUCOSE, CAPILLARY
Glucose-Capillary: 153 mg/dL — ABNORMAL HIGH (ref 70–99)
Glucose-Capillary: 175 mg/dL — ABNORMAL HIGH (ref 70–99)
Glucose-Capillary: 140 mg/dL — ABNORMAL HIGH (ref 70–99)
Glucose-Capillary: 158 mg/dL — ABNORMAL HIGH (ref 70–99)

## 2020-03-08 LAB — MAGNESIUM: Magnesium: 2.8 mg/dL — ABNORMAL HIGH (ref 1.7–2.4)

## 2020-03-08 LAB — PHOSPHORUS: Phosphorus: 3.4 mg/dL (ref 2.5–4.6)

## 2020-03-08 MED ORDER — HEPARIN (PORCINE) 25000 UT/250ML-% IV SOLN
1300.0000 [IU]/h | INTRAVENOUS | Status: DC
  Administered 2020-03-08 – 2020-03-09 (×2): 1300 [IU]/h via INTRAVENOUS
  Filled 2020-03-08: qty 250

## 2020-03-08 MED ORDER — HEPARIN (PORCINE) 25000 UT/250ML-% IV SOLN: 1100.0000 [IU]/h | INTRAVENOUS | Status: DC

## 2020-03-08 MED ORDER — HEPARIN (PORCINE) 25000 UT/250ML-% IV SOLN
1100.0000 [IU]/h | INTRAVENOUS | Status: DC
  Administered 2020-03-08: 1100 [IU]/h via INTRAVENOUS

## 2020-03-08 NOTE — Progress Notes (Addendum)
Subjective:    Patient reports pain as 4 on 0-10 scale.   Denies CP or SOB.  Voiding without difficulty. Positive flatus. Objective: Vital signs in last 24 hours: Temp:  [97.7 F (36.5 C)-98.1 F (36.7 C)] 97.7 F (36.5 C) (11/28 0600) Pulse Rate:  [61-113] 72 (11/28 0600) Resp:  [14-23] 22 (11/28 0600) BP: (143-158)/(65-76) 152/76 (11/28 0600) SpO2:  [94 %-100 %] 100 % (11/28 0600)  Intake/Output from previous day: 11/27 0701 - 11/28 0700 In: 5203.4 [P.O.:1540; I.V.:3503.4; NG/GT:60; IV Piggyback:100] Out: 2950 [Urine:2900; Emesis/NG output:50] Intake/Output this shift: No intake/output data recorded.  Recent Labs    03/06/20 0325 03/07/20 0659 03/08/20 0610  HGB 9.9* 9.4* 8.8*   Recent Labs    03/07/20 0659 03/08/20 0610  WBC 7.9 7.3  RBC 3.10* 2.95*  HCT 29.2* 28.4*  PLT 573* 467*   Recent Labs    03/07/20 0659 03/08/20 0610  NA 140 140  K 3.9 4.1  CL 102 101  CO2 25 26  BUN 131* 87*  CREATININE 2.55* 1.82*  GLUCOSE 241* 158*  CALCIUM 8.0* 8.2*   No results for input(s): LABPT, INR in the last 72 hours.  ABD soft Sensation intact distally Dorsiflexion/Plantar flexion intact Incision: dressing C/D/I and no drainage  Assessment/Plan:    N.p.o. following midnight anticipating clearance for I&D of left hip by Dr. Ricki Rodriguez tomorrow if medically cleared. Continue percutaneous drain. Continue antibiotics per ID. Creatinine improving.  Hemoglobin 8.8.   [name] 03/08/2020, 9:13 AM

## 2020-03-08 NOTE — Progress Notes (Signed)
PROGRESS NOTE    [name]  OMA:[id number] DOB: [birth year] DOA: 03/03/2020 PCP: Luetta Nutting, DO   No chief complaint on file.  Brief Narrative:  [name] is [name] 74 y.o. male with medical history significant of coronary artery disease, diabetes type 2, hypertension, anemia, CKD stage IIIb, osteoarthritis status post left hip arthroplasty, renal stone/hydronephrosis status post stents, prostate cancer ,paroxysmal atrial flutter not on anticoagulation who was sent for direct admission to [facility] from [facility],Dr Aluisio.  Patient was seen in the emergency department on 03/01/2020 for left hip pain.  There was no report of injury.  He had CT left hip done which showed  fluid collection.  He was found to have elevated ESR and CRP.  He was discharged from the emergency department to follow-up with orthopedics as per orthopedics recommendation.   In the orthopedics office, he reported chills, nausea/diarrhea and increased left hip pain.  He was hardly able to walk.  He was afebrile.  Patient was requested for direct admission by orthopedics team for drainage or intervention of the left hip fluid collection.  Lab work also showed elevated ESR and CRP. Patient seen and examined at the bedside this evening.  During my evaluation, he was hemodynamically stable and overall comfortable.  He was hypertensive.  He complained of severe pain on his left hip and the left ear was found to be tender.  He denied any fever, chest pain, shortness of breath, cough, abdomen pain, nausea, vomiting, dysuria, hematochezia or melena.  He ives alone.  Assessment & Plan:   Active Problems:   OA (osteoarthritis) of hip   Type 2 diabetes mellitus with hypoglycemia without coma (HCC)   Essential hypertension   Hyperlipidemia with target LDL less than 70   Hip pain   Effusion of hip joint, left   Abdominal distension   SBO (small bowel obstruction) (HCC)  Enterobacter Cloacae  Bacteremia  Emphysematous Pyelitis  Left Hip Septic Arthritis/Prosthetic Joint Infection CT left hip showed large fluid and air containing collection surroudning the proximal femoral component measuring up to 8.7 cm, concerning for infection/abscess.  Fluid and air collection track within the L adductor muscle compartment extending towards the symphysis.  Periprosthetic lucency along the posterior cortex of the proximal femoral stem extending distally by approximately 7.5 cm, concerning for septic loosening.  Area of lucency along the anterior aspect of the proximal native femur with air seen within bone, concerning for osteo. S/p CT guided placed 10 F drainage catheter placement  Culture with abundant enterobacter cloacae (resistant to cefazolin) Blood culture with 1 of 2 sets positive for enterobacter cloacae (resistant to cefazolin) Urine cx with enterobacter species (resistant to cefazolin) Ortho planning for surgery on Monday, appreciate recs - ok from medical standpoint for surgery, discussed increased risk given RCRI at least 2 (likely underestimates given his comorbidities).   ID consult, appreciate recs -> recommending transition to cefepime, follow cultures, discuss with urology management of stents in light of bacteremia urology recommending continue abx, consider nephrostomy tube if new onset fevers/decreased uop/worsening creatinine  Partial SBO vs Ileus: s/p NG tube placement for diffusely gas distended bowel Follow CT abdomen pelvis -> tiny richter hernia containing [name] very short loop of small bowel with an associated early/partial SBO. KUB 11/26 with mildly dilated small bowel loops Surgery c/s, appreciate recs - suspects this was ileus 2/2 medical issues that appears to be resolving -> full liquid diet, advance as tolerated  Paroxysmal [name]. fib/atrial flutter:  Follows with Dr [name].  Not on any anticoagulation at home because of anemia. chadvasc at leat 3. Rate controlled    Appreciate cardiology assistance Heparin gtt  Transition to dilt gtt, low dose oral metop, IV metop prn  Follow repeat echo - EF 55-60%, no RWMA (see report)  Diabetes type 2:Hemoglobin A1c of 7.1 as per 9/21. SSI - hold home amaryl and januvia Adjust prn  AKI on CKD stage IIIb  Anion Gap Metabolic Acidosis: His kidney function has fluctuated over several months from 1.5-3, unclear baseline.  Continue to monitor kidney function.  Avoid nephrotoxins.  He needs to follow-up with nephrology as an outpatient. Creatinine up to 2.91 on 11/25, continue IVF, creatinine improving Acidosis resolved  History of severe left hydroureteronephrosis/prostate cancer: Follows with urology.  He is status post bilateral ureteral stent placement by Dr. [name] 10/6.  Currently prostate cancer is in remission.  Normocytic anemia: Most likely associated  with chronic kidney disease, chronic medical problems.  Hemoglobin currently stable .  No report of hematochezia or melena.  Coronary artery disease: Takes aspirin.  continue to hold in preparation for procedure. Denies any chest pain.    Hypertension:  continue dilt and metop as noted above  Hyperlipidemia: Takes Lipitor.  GERD: Continue Protonix  Left hip pain/debility: We will request for PT/OT evaluation after procedure.  DVT prophylaxis: heparin Code Status: full  Family Communication: none at bedside - daughter over phone Disposition:   Status is: inpatient  The patient will require care spanning > 2 midnights and should be moved to inpatient because: Inpatient level of care appropriate due to severity of illness  Dispo: The patient is from: Home              Anticipated d/c is to: pending              Anticipated d/c date is: > 3 days              Patient currently is not medically stable to d/c.   Consultants:   IR  Ortho  Procedures: IR IMPRESSION: Successful CT guided placement of Aaron Simonis 12 French all purpose  drain catheter into the complex fluid collection involving the proximal lateral aspect the left thigh with aspiration of 50 cc of purulent fluid. Samples were sent to the laboratory as requested by the ordering clinical team.  Echo IMPRESSIONS    1. Left ventricular ejection fraction, by estimation, is 55 to 60%. The  left ventricle has normal function. The left ventricle has no regional  wall motion abnormalities. There is mild concentric left ventricular  hypertrophy. Left ventricular diastolic  function could not be evaluated.  2. Right ventricular systolic function is normal. The right ventricular  size is normal.  3. Left atrial size was mildly dilated.  4. The pericardial effusion is circumferential.  5. The mitral valve is normal in structure. Mild mitral valve  regurgitation. No evidence of mitral stenosis.  6. The aortic valve is normal in structure. Aortic valve regurgitation is  not visualized. Mild aortic valve sclerosis is present, with no evidence  of aortic valve stenosis.  7. The inferior vena cava is normal in size with greater than 50%  respiratory variability, suggesting right atrial pressure of 3 mmHg.   Comparison(s): No significant change from prior study. Prior images  reviewed side by side.  Antimicrobials: Anti-infectives (From admission, onward)   Start     Dose/Rate Route Frequency Ordered Stop   03/06/20 1700  ceFEPIme (MAXIPIME)  2 g in sodium chloride 0.9 % 100 mL IVPB        2 g 200 mL/hr over 30 Minutes Intravenous Every 12 hours 03/06/20 1340     03/05/20 1000  piperacillin-tazobactam (ZOSYN) IVPB 3.375 g  Status:  Discontinued        3.375 g 12.5 mL/hr over 240 Minutes Intravenous Every 8 hours 03/05/20 0904 03/06/20 1311      Subjective: No new complaints  Objective: Vitals:   03/07/20 1700 03/07/20 2100 03/08/20 0600 03/08/20 1400  BP: (!) 143/70 (!) 158/65 (!) 152/76 (!) 160/81  Pulse: 74 61 72 81  Resp: 20 18 (!) 22 16   Temp:  98.1 F (36.7 C) 97.7 F (36.5 C) 97.9 F (36.6 C)  TempSrc:  Oral Oral Oral  SpO2: 95% 94% 100% 97%  Weight:      Height:        Intake/Output Summary (Last 24 hours) at 03/08/2020 1612 Last data filed at 03/08/2020 0900 Gross per 24 hour  Intake 3317.09 ml  Output 2650 ml  Net 667.09 ml   Filed Weights   03/03/20 1855 03/07/20 0500  Weight: 110.3 kg 95.8 kg    Examination:  General: No acute distress. Cardiovascular: Heart sounds show Taren Toops regular rate, and rhythm. Lungs: Clear to auscultation bilaterally Abdomen: Soft, distended, but improving  Neurological: Alert and oriented 3. Moves all extremities 4. Cranial nerves II through XII grossly intact. Skin: Warm and dry. No rashes or lesions. Extremities: No clubbing or cyanosis. No edema.   Data Reviewed: I have personally reviewed following labs and imaging studies  CBC: Recent Labs  Lab 03/04/20 0537 03/05/20 0428 03/06/20 0325 03/07/20 0659 03/08/20 0610  WBC 8.2 7.4 9.9 7.9 7.3  NEUTROABS  --  6.4 8.3* 6.4 5.8  HGB 9.8* 10.3* 9.9* 9.4* 8.8*  HCT 30.9* 32.1* 30.2* 29.2* 28.4*  MCV 92.8 92.8 94.4 94.2 96.3  PLT 596* 601* 605* 573* 467*    Basic Metabolic Panel: Recent Labs  Lab 03/04/20 0537 03/05/20 0428 03/06/20 0325 03/07/20 0659 03/08/20 0610  NA 135 138 138 140 140  K 4.7 4.6 4.3 3.9 4.1  CL 103 105 106 102 101  CO2 18* 18* 16* 25 26  GLUCOSE 198* 186* 191* 241* 158*  BUN 91* 117* 132* 131* 87*  CREATININE 2.20* 2.91* 2.70* 2.55* 1.82*  CALCIUM 8.6* 8.6* 8.4* 8.0* 8.2*  MG  --  3.4* 3.3* 3.1* 2.8*  PHOS  --  6.5* 6.3* 5.0* 3.4    GFR: Estimated Creatinine Clearance: 41.4 mL/min (Jeffory Snelgrove) (by C-G formula based on SCr of 1.82 mg/dL (H)).  Liver Function Tests: Recent Labs  Lab 03/05/20 0428 03/06/20 0325 03/07/20 0659 03/08/20 0610  AST 14* _0 ALT _1 ALKPHOS 157* 143* 116 101  BILITOT 0.8 0.8 0.6 0.5  PROT 8.4* 8.1 7.4 7.3  ALBUMIN 2.6* 2.6* 2.4* 2.4*     CBG: Recent Labs  Lab 03/07/20 1137 03/07/20 1628 03/07/20 2039 03/08/20 0744 03/08/20 1130  GLUCAP 156* 141* 149* 153* 175*     Recent Results (from the past 240 hour(s))  Resp Panel by RT-PCR (Flu Temara Lanum&B, Covid) Nasopharyngeal Swab     Status: None   Collection Time: 03/01/20 12:20 PM   Specimen: Nasopharyngeal Swab; Nasopharyngeal(NP) swabs in vial transport medium  Result Value Ref Range Status   SARS Coronavirus 2 by RT PCR NEGATIVE NEGATIVE Final    Comment: (NOTE) SARS-CoV-2 target nucleic acids are NOT  DETECTED.  The SARS-CoV-2 RNA is generally detectable in upper respiratory specimens during the acute phase of infection. The lowest concentration of SARS-CoV-2 viral copies this assay can detect is 138 copies/mL. Marion Seese negative result does not preclude SARS-Cov-2 infection and should not be used as the sole basis for treatment or other patient management decisions. Mccrae Speciale negative result may occur with  improper specimen collection/handling, submission of specimen other than nasopharyngeal swab, presence of viral mutation(s) within the areas targeted by this assay, and inadequate number of viral copies(<138 copies/mL). Marisal Swarey negative result must be combined with clinical observations, patient history, and epidemiological information. The expected result is Negative.  Fact Sheet for Patients:  EntrepreneurPulse.com.au  Fact Sheet for Healthcare Providers:  IncredibleEmployment.be  This test is no t yet approved or cleared by the [place] FDA and  has been authorized for detection and/or diagnosis of SARS-CoV-2 by FDA under an Emergency Use Authorization (EUA). This EUA will remain  in effect (meaning this test can be used) for the duration of the COVID-19 declaration under Section 564(b)(1) of the Act, 21 U.S.C.section 360bbb-3(b)(1), unless the authorization is terminated  or revoked sooner.       Influenza Reia Viernes by PCR NEGATIVE  NEGATIVE Final   Influenza B by PCR NEGATIVE NEGATIVE Final    Comment: (NOTE) The Xpert Xpress SARS-CoV-2/FLU/RSV plus assay is intended as an aid in the diagnosis of influenza from Nasopharyngeal swab specimens and should not be used as Azaria Bartell sole basis for treatment. Nasal washings and aspirates are unacceptable for Xpert Xpress SARS-CoV-2/FLU/RSV testing.  Fact Sheet for Patients: EntrepreneurPulse.com.au  Fact Sheet for Healthcare Providers: IncredibleEmployment.be  This test is not yet approved or cleared by the [place] FDA and has been authorized for detection and/or diagnosis of SARS-CoV-2 by FDA under an Emergency Use Authorization (EUA). This EUA will remain in effect (meaning this test can be used) for the duration of the COVID-19 declaration under Section 564(b)(1) of the Act, 21 U.S.C. section 360bbb-3(b)(1), unless the authorization is terminated or revoked.  Performed at [facility], [place] [address] [place], [place] [address]   Culture, blood (routine x 2)     Status: Abnormal   Collection Time: 03/03/20  7:04 PM   Specimen: BLOOD LEFT HAND  Result Value Ref Range Status   Specimen Description   Final    BLOOD LEFT HAND Performed at [facility] [address], [place], [place] [address]    Special Requests   Final    BOTTLES DRAWN AEROBIC ONLY Blood Culture adequate volume Performed at [facility] [address] [place], [place] [address]    Culture  Setup Time   Final    AEROBIC BOTTLE ONLY GRAM NEGATIVE RODS CRITICAL RESULT CALLED TO, READ BACK BY AND VERIFIED WITH: Ulice Bold PharmD 9:20 03/06/20 ([email or web address]) Performed at [facility], [place] [address], [place], [place] [address]    Culture ENTEROBACTER CLOACAE (Artis Beggs)  Final   Report Status 03/08/2020 FINAL  Final   Organism ID, Bacteria ENTEROBACTER CLOACAE  Final      Susceptibility   Enterobacter cloacae  - MIC*    CEFAZOLIN >=64 RESISTANT Resistant     CEFEPIME <=0.12 SENSITIVE Sensitive     CEFTAZIDIME <=1 SENSITIVE Sensitive     CIPROFLOXACIN <=0.25 SENSITIVE Sensitive     GENTAMICIN <=1 SENSITIVE Sensitive     IMIPENEM <=0.25 SENSITIVE Sensitive     TRIMETH/SULFA <=20 SENSITIVE Sensitive     PIP/TAZO <=  4 SENSITIVE Sensitive     * ENTEROBACTER CLOACAE  Blood Culture ID Panel (Reflexed)     Status: Abnormal   Collection Time: 03/03/20  7:04 PM  Result Value Ref Range Status   Enterococcus faecalis NOT DETECTED NOT DETECTED Final   Enterococcus Faecium NOT DETECTED NOT DETECTED Final   Listeria monocytogenes NOT DETECTED NOT DETECTED Final   Staphylococcus species NOT DETECTED NOT DETECTED Final   Staphylococcus aureus (BCID) NOT DETECTED NOT DETECTED Final   Staphylococcus epidermidis NOT DETECTED NOT DETECTED Final   Staphylococcus lugdunensis NOT DETECTED NOT DETECTED Final   Streptococcus species NOT DETECTED NOT DETECTED Final   Streptococcus agalactiae NOT DETECTED NOT DETECTED Final   Streptococcus pneumoniae NOT DETECTED NOT DETECTED Final   Streptococcus pyogenes NOT DETECTED NOT DETECTED Final   Beckham Capistran.calcoaceticus-baumannii NOT DETECTED NOT DETECTED Final   Bacteroides fragilis NOT DETECTED NOT DETECTED Final   Enterobacterales DETECTED (Synthia Fairbank) NOT DETECTED Final    Comment: Enterobacterales represent Yesli Vanderhoff large order of gram negative bacteria, not Izabela Ow single organism. CRITICAL RESULT CALLED TO, READ BACK BY AND VERIFIED WITH: Ulice Bold PharmD 9:20 03/06/20 ([email or web address])    Enterobacter cloacae complex DETECTED (Smt. Loder) NOT DETECTED Final    Comment: CRITICAL RESULT CALLED TO, READ BACK BY AND VERIFIED WITH: Ulice Bold PharmD 9:20 03/06/20 ([email or web address])    Escherichia coli NOT DETECTED NOT DETECTED Final   Klebsiella aerogenes NOT DETECTED NOT DETECTED Final   Klebsiella oxytoca NOT DETECTED NOT DETECTED Final   Klebsiella pneumoniae NOT DETECTED NOT DETECTED Final   Proteus species NOT DETECTED  NOT DETECTED Final   Salmonella species NOT DETECTED NOT DETECTED Final   Serratia marcescens NOT DETECTED NOT DETECTED Final   Haemophilus influenzae NOT DETECTED NOT DETECTED Final   Neisseria meningitidis NOT DETECTED NOT DETECTED Final   Pseudomonas aeruginosa NOT DETECTED NOT DETECTED Final   Stenotrophomonas maltophilia NOT DETECTED NOT DETECTED Final   Candida albicans NOT DETECTED NOT DETECTED Final   Candida auris NOT DETECTED NOT DETECTED Final   Candida glabrata NOT DETECTED NOT DETECTED Final   Candida krusei NOT DETECTED NOT DETECTED Final   Candida parapsilosis NOT DETECTED NOT DETECTED Final   Candida tropicalis NOT DETECTED NOT DETECTED Final   Cryptococcus neoformans/gattii NOT DETECTED NOT DETECTED Final   CTX-M ESBL NOT DETECTED NOT DETECTED Final   Carbapenem resistance IMP NOT DETECTED NOT DETECTED Final   Carbapenem resistance KPC NOT DETECTED NOT DETECTED Final   Carbapenem resistance NDM NOT DETECTED NOT DETECTED Final   Carbapenem resist OXA 48 LIKE NOT DETECTED NOT DETECTED Final   Carbapenem resistance VIM NOT DETECTED NOT DETECTED Final    Comment: Performed at [facility], [address] [place] [address] [place], [place] [address]  Culture, blood (routine x 2)     Status: None   Collection Time: 03/03/20  7:06 PM   Specimen: BLOOD RIGHT HAND  Result Value Ref Range Status   Specimen Description   Final    BLOOD RIGHT HAND Performed at [facility] [address], [place], [place] [address]    Special Requests   Final    BOTTLES DRAWN AEROBIC ONLY Blood Culture adequate volume Performed at [facility] [address], [place], [place] [address]    Culture   Final    NO GROWTH 5 DAYS Performed at [facility], [place] [address] [place], [place] [address]    Report Status 03/08/2020 FINAL  Final  Aerobic/Anaerobic Culture (surgical/deep wound)  Status: None (Preliminary result)   Collection Time: 03/04/20  3:38 PM    Specimen: Abscess  Result Value Ref Range Status   Specimen Description   Final    ABSCESS DRAIN LEFT LATERAL THIGH Performed at [facility] [address], [place], [place] [address]    Special Requests   Final    NONE Performed at [facility], [place] [address], [place], [place] [address]    Gram Stain   Final    ABUNDANT WBC PRESENT,BOTH PMN AND MONONUCLEAR ABUNDANT GRAM NEGATIVE RODS Performed at [facility], [place] [address] [place], [place] [address]    Culture   Final    ABUNDANT ENTEROBACTER CLOACAE NO ANAEROBES ISOLATED; CULTURE IN PROGRESS FOR 5 DAYS    Report Status PENDING  Incomplete   Organism ID, Bacteria ENTEROBACTER CLOACAE  Final      Susceptibility   Enterobacter cloacae - MIC*    CEFAZOLIN >=64 RESISTANT Resistant     CEFEPIME <=0.12 SENSITIVE Sensitive     CEFTAZIDIME <=1 SENSITIVE Sensitive     CIPROFLOXACIN <=0.25 SENSITIVE Sensitive     GENTAMICIN <=1 SENSITIVE Sensitive     IMIPENEM 0.5 SENSITIVE Sensitive     TRIMETH/SULFA <=20 SENSITIVE Sensitive     PIP/TAZO <=4 SENSITIVE Sensitive     * ABUNDANT ENTEROBACTER CLOACAE  Culture, Urine     Status: Abnormal   Collection Time: 03/05/20  8:10 AM   Specimen: Urine, Random  Result Value Ref Range Status   Specimen Description   Final    URINE, RANDOM Performed at [facility], [place] [address], [place], [place] [address]    Special Requests   Final    NONE Performed at [facility], [place] [address], [place], [place] [address]    Culture >=100,000 COLONIES/mL ENTEROBACTER SPECIES (Gareld Obrecht)  Final   Report Status 03/07/2020 FINAL  Final   Organism ID, Bacteria ENTEROBACTER SPECIES (Morton Simson)  Final      Susceptibility   Enterobacter species - MIC*    CEFAZOLIN >=64 RESISTANT Resistant     CEFEPIME <=0.12 SENSITIVE Sensitive     CEFTRIAXONE <=0.25 SENSITIVE Sensitive     CIPROFLOXACIN <=0.25 SENSITIVE Sensitive     GENTAMICIN <=1  SENSITIVE Sensitive     IMIPENEM 0.5 SENSITIVE Sensitive     NITROFURANTOIN 32 SENSITIVE Sensitive     TRIMETH/SULFA <=20 SENSITIVE Sensitive     PIP/TAZO <=4 SENSITIVE Sensitive     * >=100,000 COLONIES/mL ENTEROBACTER SPECIES         Radiology Studies: No results found.      Scheduled Meds: . atorvastatin  40 mg Oral Daily  . bisacodyl  10 mg Rectal Daily  . Chlorhexidine Gluconate Cloth  6 each Topical Daily  . diclofenac Sodium  4 g Topical QID  . ferrous sulfate  325 mg Oral Q breakfast  . gabapentin  300 mg Oral TID  . insulin aspart  0-15 Units Subcutaneous TID WC  . insulin aspart  0-5 Units Subcutaneous QHS  . lip balm  1 application Topical BID  . metoprolol tartrate  12.5 mg Oral BID  . pantoprazole  40 mg Oral Daily  . sodium chloride flush  5 mL Intracatheter Q8H   Continuous Infusions: . ceFEPime (MAXIPIME) IV Stopped (03/08/20 0700)  . diltiazem (CARDIZEM) infusion 15 mg/hr (03/08/20 1411)  . heparin 1,100 Units/hr (03/08/20 0930)  . lactated ringers 125 mL/hr at 03/08/20 0925     LOS:  5 days    Time spent: over 30 min    Fayrene Helper, MD [facility]   To contact the attending provider between 7A-7P or the covering provider during after hours 7P-7A, please log into the web site [email or web address] and access using universal [place] password for that web site. If you do not have the password, please call the hospital operator.  03/08/2020, 4:12 PM

## 2020-03-08 NOTE — Progress Notes (Signed)
Subjective/Chief Complaint: No complaints Denies nausea Tolerated clears Had another small bm   Objective: Vital signs in last 24 hours: Temp:  [97.7 F (36.5 C)-98.1 F (36.7 C)] 97.7 F (36.5 C) (11/28 0600) Pulse Rate:  [61-113] 72 (11/28 0600) Resp:  [14-23] 22 (11/28 0600) BP: (143-158)/(65-76) 152/76 (11/28 0600) SpO2:  [94 %-100 %] 100 % (11/28 0600) Last BM Date: 03/06/20  Intake/Output from previous day: 11/27 0701 - 11/28 0700 In: 5203.4 [P.O.:1540; I.V.:3503.4; NG/GT:60; IV Piggyback:100] Out: 2950 [Urine:2900; Emesis/NG output:50] Intake/Output this shift: No intake/output data recorded.  Exam: Up in a chair Abdomen obese, soft, nontender  Lab Results:  Recent Labs    03/07/20 0659 03/08/20 0610  WBC 7.9 7.3  HGB 9.4* 8.8*  HCT 29.2* 28.4*  PLT 573* 467*   BMET Recent Labs    03/07/20 0659 03/08/20 0610  NA 140 140  K 3.9 4.1  CL 102 101  CO2 25 26  GLUCOSE 241* 158*  BUN 131* 87*  CREATININE 2.55* 1.82*  CALCIUM 8.0* 8.2*   PT/INR No results for input(s): LABPROT, INR in the last 72 hours. ABG No results for input(s): PHART, HCO3 in the last 72 hours.  Invalid input(s): PCO2, PO2  Studies/Results: DG Abd Portable 1V  Result Date: 03/06/2020 CLINICAL DATA:  Small-bowel obstruction. EXAM: PORTABLE ABDOMEN - 1 VIEW COMPARISON:  03/05/2020 abdominal radiographs and prior. FINDINGS: Indwelling enteric tube with tip and side hole overlying the gastric body. Increased conspicuity of gas-filled mildly dilated small bowel loops. Residual enteric contrast opacifies nondilated ascending colon. Bilateral double-J ureteral stents are unchanged. Sequela of bilateral hip arthroplasties. Partially imaged left hip surgical drain. IMPRESSION: Mildly dilated small bowel loops, more conspicuous than prior exam. Indwelling enteric tube and bilateral ureteral stents, unchanged. Electronically Signed   By: Primitivo Gauze M.D.   On: 03/06/2020 10:02    ECHOCARDIOGRAM COMPLETE  Result Date: 03/06/2020    ECHOCARDIOGRAM REPORT   Patient Name:   [name] Date of Exam: 03/06/2020 Medical Rec #:  [id number]        Height:       74.0 in Accession #:    [id number]       Weight:       243.2 lb Date of Birth:  Mar 08, 1946        BSA:          2.362 m Patient Age:    74 years         BP:           171/78 mmHg Patient Gender: M                HR:           86 bpm. Exam Location:  Inpatient Procedure: 2D Echo, Cardiac Doppler and Color Doppler Indications:    I48.91* Unspeicified atrial fibrillation  History:        Patient has prior history of Echocardiogram examinations, most                 recent 06/24/2016. Previous Myocardial Infarction and CAD,                 Abnormal ECG, Arrythmias:Atrial Fibrillation,                 Signs/Symptoms:Chest Pain; Risk Factors:Diabetes, Former Smoker,                 Hypertension and Dyslipidemia. Cancer.  Sonographer:    [place]  Referring Phys: [id number] CALDWELL POWELL JR  Sonographer Comments: Suboptimal parasternal window and suboptimal subcostal window. IMPRESSIONS  1. Left ventricular ejection fraction, by estimation, is 55 to 60%. The left ventricle has normal function. The left ventricle has no regional wall motion abnormalities. There is mild concentric left ventricular hypertrophy. Left ventricular diastolic function could not be evaluated.  2. Right ventricular systolic function is normal. The right ventricular size is normal.  3. Left atrial size was mildly dilated.  4. The pericardial effusion is circumferential.  5. The mitral valve is normal in structure. Mild mitral valve regurgitation. No evidence of mitral stenosis.  6. The aortic valve is normal in structure. Aortic valve regurgitation is not visualized. Mild aortic valve sclerosis is present, with no evidence of aortic valve stenosis.  7. The inferior vena cava is normal in size with greater than 50% respiratory variability, suggesting right atrial  pressure of 3 mmHg. Comparison(s): No significant change from prior study. Prior images reviewed side by side. FINDINGS  Left Ventricle: Left ventricular ejection fraction, by estimation, is 55 to 60%. The left ventricle has normal function. The left ventricle has no regional wall motion abnormalities. The left ventricular internal cavity size was normal in size. There is  mild concentric left ventricular hypertrophy. Left ventricular diastolic function could not be evaluated due to atrial fibrillation. Left ventricular diastolic function could not be evaluated. Right Ventricle: The right ventricular size is normal. No increase in right ventricular wall thickness. Right ventricular systolic function is normal. Left Atrium: Left atrial size was mildly dilated. Right Atrium: Right atrial size was normal in size. Pericardium: Trivial pericardial effusion is present. The pericardial effusion is circumferential. Mitral Valve: The mitral valve is normal in structure. Mild mitral annular calcification. Mild mitral valve regurgitation, with centrally-directed jet. No evidence of mitral valve stenosis. Tricuspid Valve: The tricuspid valve is normal in structure. Tricuspid valve regurgitation is not demonstrated. No evidence of tricuspid stenosis. Aortic Valve: The aortic valve is normal in structure. Aortic valve regurgitation is not visualized. Mild aortic valve sclerosis is present, with no evidence of aortic valve stenosis. Pulmonic Valve: The pulmonic valve was not well visualized. Pulmonic valve regurgitation is not visualized. No evidence of pulmonic stenosis. Aorta: The aortic root is normal in size and structure. Venous: The inferior vena cava is normal in size with greater than 50% respiratory variability, suggesting right atrial pressure of 3 mmHg. IAS/Shunts: No atrial level shunt detected by color flow Doppler.  LEFT VENTRICLE PLAX 2D LVIDd:         4.00 cm      Diastology LVIDs:         2.80 cm      LV e'  medial:    13.50 cm/s LV PW:         1.60 cm      LV E/e' medial:  6.0 LV IVS:        1.50 cm      LV e' lateral:   17.20 cm/s LVOT diam:     1.90 cm      LV E/e' lateral: 4.7 LV SV:         54 LV SV Index:   23 LVOT Area:     2.84 cm  LV Volumes (MOD) LV vol d, MOD A2C: 58.0 ml LV vol d, MOD A4C: 107.0 ml LV vol s, MOD A2C: 29.0 ml LV vol s, MOD A4C: 41.2 ml LV SV MOD A2C:     29.0 ml  LV SV MOD A4C:     107.0 ml LV SV MOD BP:      47.0 ml RIGHT VENTRICLE         IVC TAPSE (M-mode): 2.7 cm  IVC diam: 1.70 cm LEFT ATRIUM             Index       RIGHT ATRIUM           Index LA diam:        3.70 cm 1.57 cm/m  RA Area:     15.30 cm LA Vol (A2C):   39.4 ml 16.68 ml/m RA Volume:   37.00 ml  15.67 ml/m LA Vol (A4C):   58.0 ml 24.56 ml/m LA Biplane Vol: 49.4 ml 20.92 ml/m  AORTIC VALVE LVOT Vmax:   110.00 cm/s LVOT Vmean:  75.100 cm/s LVOT VTI:    0.192 m  AORTA Ao Root diam: 3.30 cm MITRAL VALVE MV Area (PHT): 4.60 cm    SHUNTS MV Decel Time: 165 msec    Systemic VTI:  0.19 m MV E velocity: 81.40 cm/s  Systemic Diam: 1.90 cm Dani Gobble Croitoru MD Electronically signed by Sanda Klein MD Signature Date/Time: 03/06/2020/2:21:39 PM    Final     Anti-infectives: Anti-infectives (From admission, onward)   Start     Dose/Rate Route Frequency Ordered Stop   03/06/20 1700  ceFEPIme (MAXIPIME) 2 g in sodium chloride 0.9 % 100 mL IVPB        2 g 200 mL/hr over 30 Minutes Intravenous Every 12 hours 03/06/20 1340     03/05/20 1000  piperacillin-tazobactam (ZOSYN) IVPB 3.375 g  Status:  Discontinued        3.375 g 12.5 mL/hr over 240 Minutes Intravenous Every 8 hours 03/05/20 0904 03/06/20 1311      Assessment/Plan: Ileus  Again, no evidence of sbo on small bowel protocol films Suspect ileus secondary to other medical issues and it appears to be resolving Will allow full liquids Can advance later as tolerated Surgery will sign off.  Call back if needed  LOS: 5 days    [name] 03/08/2020

## 2020-03-08 NOTE — Consult Note (Signed)
Ref: Luetta Nutting, DO   Subjective:  Awake.  Atrial flutter continues. Otherwise VS stable. HR in 80's Creatinine is 1.82 from 2.55 mg. No chest pain. Awaiting Left hip surgery for sepsis.  Objective:  Vital Signs in the last 24 hours: Temp:  [97.7 F (36.5 C)-98.1 F (36.7 C)] 97.9 F (36.6 C) (11/28 1400) Pulse Rate:  [61-81] 81 (11/28 1400) Cardiac Rhythm: Atrial flutter (11/28 0935) Resp:  [16-22] 16 (11/28 1400) BP: (152-160)/(65-81) 160/81 (11/28 1400) SpO2:  [94 %-100 %] 97 % (11/28 1400)  Physical Exam: BP Readings from Last 1 Encounters:  03/08/20 (!) 160/81     Wt Readings from Last 1 Encounters:  03/07/20 95.8 kg    Weight change:  Body mass index is 27.12 kg/m. HEENT: [place]/AT, Eyes-Brown, Conjunctiva-Pale pink, Sclera-Non-icteric Neck: No JVD, No bruit, Trachea midline. Lungs:  Clear, Bilateral. Cardiac:  Regular rhythm, normal S1 and S2, no S3. II/VI systolic murmur. Abdomen:  Soft, non-tender. BS present. Extremities:  No edema present. No cyanosis. No clubbing. CNS: AxOx2, Cranial nerves grossly intact, moves all 4 extremities.  Skin: Warm and dry.   Intake/Output from previous day: 11/27 0701 - 11/28 0700 In: 5203.4 [P.O.:1540; I.V.:3503.4; NG/GT:60; IV Piggyback:100] Out: 2950 [Urine:2900; Emesis/NG output:50]    Lab Results: BMET    Component Value Date/Time   NA 140 03/08/2020 0610   NA 140 03/07/2020 0659   NA 138 03/06/2020 0325   NA 138 10/01/2019 0803   K 4.1 03/08/2020 0610   K 3.9 03/07/2020 0659   K 4.3 03/06/2020 0325   CL 101 03/08/2020 0610   CL 102 03/07/2020 0659   CL 106 03/06/2020 0325   CO2 26 03/08/2020 0610   CO2 25 03/07/2020 0659   CO2 16 (L) 03/06/2020 0325   GLUCOSE 158 (H) 03/08/2020 0610   GLUCOSE 241 (H) 03/07/2020 0659   GLUCOSE 191 (H) 03/06/2020 0325   BUN 87 (H) 03/08/2020 0610   BUN 131 (H) 03/07/2020 0659   BUN 132 (H) 03/06/2020 0325   BUN 27 (A) 10/01/2019 0803   CREATININE 1.82 (H) 03/08/2020  0610   CREATININE 2.55 (H) 03/07/2020 0659   CREATININE 2.70 (H) 03/06/2020 0325   CREATININE 1.63 (H) 01/21/2020 0953   CREATININE 3.31 (H) 01/13/2020 0716   CREATININE 3.11 (H) 01/06/2020 0751   CALCIUM 8.2 (L) 03/08/2020 0610   CALCIUM 8.0 (L) 03/07/2020 0659   CALCIUM 8.4 (L) 03/06/2020 0325   GFRNONAA 38 (L) 03/08/2020 0610   GFRNONAA 26 (L) 03/07/2020 0659   GFRNONAA 24 (L) 03/06/2020 0325   GFRNONAA 17 (L) 01/13/2020 0716   GFRNONAA 19 (L) 01/06/2020 0751   GFRNONAA 32 (L) 12/06/2019 0736   GFRAA 20 (L) 01/13/2020 0716   GFRAA 22 (L) 01/06/2020 0751   GFRAA 37 (L) 12/06/2019 0736   CBC    Component Value Date/Time   WBC 7.3 03/08/2020 0610   RBC 2.95 (L) 03/08/2020 0610   HGB 8.8 (L) 03/08/2020 0610   HCT 28.4 (L) 03/08/2020 0610   PLT 467 (H) 03/08/2020 0610   MCV 96.3 03/08/2020 0610   MCH 29.8 03/08/2020 0610   MCHC 31.0 03/08/2020 0610   RDW 14.6 03/08/2020 0610   LYMPHSABS 0.8 03/08/2020 0610   MONOABS 0.6 03/08/2020 0610   EOSABS 0.1 03/08/2020 0610   BASOSABS 0.0 03/08/2020 0610   HEPATIC Function Panel Recent Labs    03/06/20 0325 03/07/20 0659 03/08/20 0610  PROT 8.1 7.4 7.3   HEMOGLOBIN A1C No components found  for: HGA1C,  MPG CARDIAC ENZYMES Lab Results  Component Value Date   CKTOTAL 122 09/02/2009   CKMB 1.0 09/02/2009   TROPONINI <0.03 06/25/2016   TROPONINI <0.03 06/25/2016   TROPONINI <0.03 06/24/2016   BNP No results for input(s): PROBNP in the last 8760 hours. TSH No results for input(s): TSH in the last 8760 hours. CHOLESTEROL Recent Labs    10/01/19 0803  CHOL 154    Scheduled Meds: . atorvastatin  40 mg Oral Daily  . bisacodyl  10 mg Rectal Daily  . Chlorhexidine Gluconate Cloth  6 each Topical Daily  . diclofenac Sodium  4 g Topical QID  . ferrous sulfate  325 mg Oral Q breakfast  . gabapentin  300 mg Oral TID  . insulin aspart  0-15 Units Subcutaneous TID WC  . insulin aspart  0-5 Units Subcutaneous QHS  . lip  balm  1 application Topical BID  . metoprolol tartrate  12.5 mg Oral BID  . pantoprazole  40 mg Oral Daily  . sodium chloride flush  5 mL Intracatheter Q8H   Continuous Infusions: . ceFEPime (MAXIPIME) IV 2 g (03/08/20 1624)  . diltiazem (CARDIZEM) infusion 15 mg/hr (03/08/20 1411)  . heparin 1,100 Units/hr (03/08/20 0930)  . lactated ringers 125 mL/hr at 03/08/20 0925   PRN Meds:.albuterol, alum & mag hydroxide-simeth, docusate sodium, HYDROmorphone (DILAUDID) injection, magic mouthwash, menthol-cetylpyridinium, metoprolol tartrate, morphine, ondansetron (ZOFRAN) IV, phenol  Assessment/Plan: Atrial flutter with controlled ventricular response Acute small bowel obstruction, improving Acute pyelitis Left hip septic arthritis Type 2 DM with hyperglycemia HTN CAD Old MI S/P CA of prostate S/P bilateral ureteric stents Anemia of chronic disease  Continue IV diltiazem for now.  May use IV metoprolol as needed only for heart rate control. May undergo hip surgery.   LOS: 5 days   Time spent including chart review, lab review, examination, discussion with patient/Nurse/Physician : 30 min   Dixie Dials  MD  03/08/2020, 5:09 PM

## 2020-03-08 NOTE — Progress Notes (Addendum)
[place] for heparin Indication: atrial fibrillation  Allergies  Allergen Reactions  . Shellfish Allergy Rash    Patient Measurements: Height: 6\' 2"  (188 cm) Weight: 95.8 kg (211 lb 3.2 oz) (bedscale) IBW/kg (Calculated) : 82.2 Heparin Dosing Weight: 105 kg  Vital Signs: Temp: 97.7 F (36.5 C) (11/28 0600) Temp Source: Oral (11/28 0600) BP: 152/76 (11/28 0600) Pulse Rate: 72 (11/28 0600)  Labs: Recent Labs    03/06/20 0325 03/06/20 1245 03/07/20 0659 03/08/20 0610 03/08/20 0829  HGB 9.9*  --  9.4* 8.8*  --   HCT 30.2*  --  29.2* 28.4*  --   PLT 605*  --  573* 467*  --   HEPARINUNFRC 0.94*   < > 0.43 <0.10* 0.16*  CREATININE 2.70*  --  2.55* 1.82*  --    < > = values in this interval not displayed.    Estimated Creatinine Clearance: 41.4 mL/min (A) (by C-G formula based on SCr of 1.82 mg/dL (H)).   Assessment: Patient is a 74 y.o M with hx afib (not on anticoag. PTA) presented to the ED from [facility] for management of septic left hip with plan for I&D on 03/09/20.  He was found to be in afib. Pharmacy is consulted to start heparin for afib.  Significant Events: - 11/24: IR placement of drainage cath into hip abscess  Today, 03/08/2020: - Heparin level at 0610 this morning was undetectable. Repeat heparin level at 0830 to verify came back subtherapeutic at 0.16. Per pt's RN, no issues with IV line. Heparin drip currently running at 900 units/hr - Hgb down 8.8, plts 167 - Scr remains elevated but trending down - no bleeding documented  Goal of Therapy:  Heparin level 0.3-0.7 units/ml Monitor platelets by anticoagulation protocol: Yes   Plan:  - Increase heparin drip to 1100 units/hr  - check 8 hr heparin level - monitor for s/sx bleeding - please advise when heparin drip needs to held for OR procedure on 11/29  Aleksander Edmiston P PharmD, BCPS 03/08/2020,9:19 AM _________________________________  Adden:  - Heparin  level now back as 0.25 after rate changed to 1100 units/hr earlier today. - increase heparin drip to 1300 units - check 8 hr heparin level after rate change  Dia Sitter, PharmD, BCPS 03/08/2020 6:38 PM

## 2020-03-08 NOTE — Progress Notes (Signed)
Spoke to [employer] from [place] re: when heparin drip needs to be held for OR procedure. She stated that ortho team will make the decision tomorrow (11/29) since patient is an add on and will likely be going to surgery in the afternoon.  Dia Sitter, PharmD, BCPS 03/08/2020 7:12 PM

## 2020-03-09 ENCOUNTER — Encounter (HOSPITAL_COMMUNITY): Payer: Self-pay | Admitting: Family Medicine

## 2020-03-09 ENCOUNTER — Inpatient Hospital Stay (HOSPITAL_COMMUNITY): Payer: HMO | Admitting: Anesthesiology

## 2020-03-09 ENCOUNTER — Encounter (HOSPITAL_COMMUNITY)
Admission: AD | Disposition: A | Discharge status: Skilled Nursing Facility | Payer: Self-pay | Source: Ambulatory Visit | Attending: Internal Medicine

## 2020-03-09 DIAGNOSIS — T8459XA Infection and inflammatory reaction due to other internal joint prosthesis, initial encounter: Secondary | ICD-10-CM

## 2020-03-09 DIAGNOSIS — B952 Enterococcus as the cause of diseases classified elsewhere: Secondary | ICD-10-CM

## 2020-03-09 DIAGNOSIS — E11649 Type 2 diabetes mellitus with hypoglycemia without coma: Secondary | ICD-10-CM | POA: Diagnosis not present

## 2020-03-09 DIAGNOSIS — M25452 Effusion, left hip: Secondary | ICD-10-CM | POA: Diagnosis not present

## 2020-03-09 DIAGNOSIS — R7881 Bacteremia: Secondary | ICD-10-CM | POA: Diagnosis not present

## 2020-03-09 HISTORY — PX: INCISION AND DRAINAGE: SHX5863

## 2020-03-09 LAB — AEROBIC/ANAEROBIC CULTURE W GRAM STAIN (SURGICAL/DEEP WOUND)

## 2020-03-09 LAB — GLUCOSE, CAPILLARY
Glucose-Capillary: 146 mg/dL — ABNORMAL HIGH (ref 70–99)
Glucose-Capillary: 139 mg/dL — ABNORMAL HIGH (ref 70–99)
Glucose-Capillary: 177 mg/dL — ABNORMAL HIGH (ref 70–99)
Glucose-Capillary: 178 mg/dL — ABNORMAL HIGH (ref 70–99)

## 2020-03-09 LAB — COMPREHENSIVE METABOLIC PANEL
ALT: 33 U/L (ref 0–44)
AST: 44 U/L — ABNORMAL HIGH (ref 15–41)
Albumin: 2.3 g/dL — ABNORMAL LOW (ref 3.5–5.0)
Alkaline Phosphatase: 94 U/L (ref 38–126)
Anion gap: 12 (ref 5–15)
BUN: 57 mg/dL — ABNORMAL HIGH (ref 8–23)
CO2: 25 mmol/L (ref 22–32)
Calcium: 8.3 mg/dL — ABNORMAL LOW (ref 8.9–10.3)
Chloride: 101 mmol/L (ref 98–111)
Creatinine, Ser: 1.44 mg/dL — ABNORMAL HIGH (ref 0.61–1.24)
GFR, Estimated: 51 mL/min — ABNORMAL LOW (ref >60)
Glucose, Bld: 148 mg/dL — ABNORMAL HIGH (ref 70–99)
Potassium: 4.4 mmol/L (ref 3.5–5.1)
Sodium: 138 mmol/L (ref 135–145)
Total Bilirubin: 0.6 mg/dL (ref 0.3–1.2)
Total Protein: 7.1 g/dL (ref 6.5–8.1)

## 2020-03-09 LAB — CBC WITH DIFFERENTIAL/PLATELET
Abs Immature Granulocytes: 0.1 10*3/uL — ABNORMAL HIGH (ref 0.00–0.07)
Basophils Absolute: 0 10*3/uL (ref 0.0–0.1)
Basophils Relative: 0 %
Eosinophils Absolute: 0.1 10*3/uL (ref 0.0–0.5)
Eosinophils Relative: 2 %
HCT: 28.6 % — ABNORMAL LOW (ref 39.0–52.0)
Hemoglobin: 9 g/dL — ABNORMAL LOW (ref 13.0–17.0)
Immature Granulocytes: 1 %
Lymphocytes Relative: 10 %
Lymphs Abs: 0.7 10*3/uL (ref 0.7–4.0)
MCH: 29.8 pg (ref 26.0–34.0)
MCHC: 31.5 g/dL (ref 30.0–36.0)
MCV: 94.7 fL (ref 80.0–100.0)
Monocytes Absolute: 0.6 10*3/uL (ref 0.1–1.0)
Monocytes Relative: 8 %
Neutro Abs: 6 10*3/uL (ref 1.7–7.7)
Neutrophils Relative %: 79 %
Platelets: 500 10*3/uL — ABNORMAL HIGH (ref 150–400)
RBC: 3.02 MIL/uL — ABNORMAL LOW (ref 4.22–5.81)
RDW: 14.1 % (ref 11.5–15.5)
WBC: 7.6 10*3/uL (ref 4.0–10.5)
nRBC: 0 % (ref 0.0–0.2)

## 2020-03-09 LAB — PHOSPHORUS: Phosphorus: 2.7 mg/dL (ref 2.5–4.6)

## 2020-03-09 LAB — MAGNESIUM: Magnesium: 2.3 mg/dL (ref 1.7–2.4)

## 2020-03-09 LAB — HEPARIN LEVEL (UNFRACTIONATED): Heparin Unfractionated: 0.24 IU/mL — ABNORMAL LOW (ref 0.30–0.70)

## 2020-03-09 LAB — PREPARE RBC (CROSSMATCH)

## 2020-03-09 SURGERY — INCISION AND DRAINAGE
Anesthesia: General | Laterality: Left

## 2020-03-09 MED ORDER — LACTATED RINGERS IV SOLN: INTRAVENOUS | Status: DC | PRN

## 2020-03-09 MED ORDER — RIVAROXABAN 10 MG PO TABS: 10.0000 mg | ORAL_TABLET | Freq: Every day | ORAL | Status: DC

## 2020-03-09 MED ORDER — PHENOL 1.4 % MT LIQD: 1.0000 | OROMUCOSAL | Status: DC | PRN

## 2020-03-09 MED ORDER — POLYETHYLENE GLYCOL 3350 17 G PO PACK
17.0000 g | PACK | Freq: Two times a day (BID) | ORAL | Status: DC
  Administered 2020-03-10 – 2020-03-11 (×3): 17 g via ORAL
  Filled 2020-03-09 (×3): qty 1

## 2020-03-09 MED ORDER — LACTATED RINGERS IV SOLN: INTRAVENOUS | Status: DC

## 2020-03-09 MED ORDER — LIDOCAINE HCL (PF) 2 % IJ SOLN
INTRAMUSCULAR | Status: AC
  Filled 2020-03-09: qty 5

## 2020-03-09 MED ORDER — DOCUSATE SODIUM 100 MG PO CAPS
100.0000 mg | ORAL_CAPSULE | Freq: Two times a day (BID) | ORAL | Status: DC
  Administered 2020-03-10 – 2020-03-12 (×5): 100 mg via ORAL
  Filled 2020-03-09 (×5): qty 1

## 2020-03-09 MED ORDER — FENTANYL CITRATE (PF) 100 MCG/2ML IJ SOLN
INTRAMUSCULAR | Status: DC | PRN
  Administered 2020-03-09 (×8): 100 ug via INTRAVENOUS

## 2020-03-09 MED ORDER — 0.9 % SODIUM CHLORIDE (POUR BTL) OPTIME
TOPICAL | Status: DC | PRN
  Administered 2020-03-09: 1000 mL

## 2020-03-09 MED ORDER — SUCCINYLCHOLINE CHLORIDE 200 MG/10ML IV SOSY
PREFILLED_SYRINGE | INTRAVENOUS | Status: DC | PRN
  Administered 2020-03-09: 100 mg via INTRAVENOUS

## 2020-03-09 MED ORDER — PHENYLEPHRINE 40 MCG/ML (10ML) SYRINGE FOR IV PUSH (FOR BLOOD PRESSURE SUPPORT)
PREFILLED_SYRINGE | INTRAVENOUS | Status: DC | PRN
  Administered 2020-03-09: 80 ug via INTRAVENOUS
  Administered 2020-03-09: 120 ug via INTRAVENOUS

## 2020-03-09 MED ORDER — FENTANYL CITRATE (PF) 100 MCG/2ML IJ SOLN
INTRAMUSCULAR | Status: AC
  Filled 2020-03-09: qty 2

## 2020-03-09 MED ORDER — SODIUM CHLORIDE 0.9 % IR SOLN
Status: DC | PRN
  Administered 2020-03-09 (×2): 3000 mL

## 2020-03-09 MED ORDER — SUCCINYLCHOLINE CHLORIDE 200 MG/10ML IV SOSY
PREFILLED_SYRINGE | INTRAVENOUS | Status: AC
  Filled 2020-03-09: qty 10

## 2020-03-09 MED ORDER — ROCURONIUM BROMIDE 10 MG/ML (PF) SYRINGE
PREFILLED_SYRINGE | INTRAVENOUS | Status: AC
  Filled 2020-03-09: qty 10

## 2020-03-09 MED ORDER — HEPARIN (PORCINE) 25000 UT/250ML-% IV SOLN
1500.0000 [IU]/h | INTRAVENOUS | Status: DC
  Administered 2020-03-09: 1500 [IU]/h via INTRAVENOUS

## 2020-03-09 MED ORDER — PHENYLEPHRINE HCL-NACL 10-0.9 MG/250ML-% IV SOLN
INTRAVENOUS | Status: DC | PRN
  Administered 2020-03-09: 25 ug/min via INTRAVENOUS
  Administered 2020-03-09: 15 ug/min via INTRAVENOUS

## 2020-03-09 MED ORDER — ROCURONIUM BROMIDE 10 MG/ML (PF) SYRINGE
PREFILLED_SYRINGE | INTRAVENOUS | Status: DC | PRN
  Administered 2020-03-09: 30 mg via INTRAVENOUS
  Administered 2020-03-09: 10 mg via INTRAVENOUS
  Administered 2020-03-09: 30 mg via INTRAVENOUS

## 2020-03-09 MED ORDER — POLYETHYLENE GLYCOL 3350 17 G PO PACK: 17.0000 g | PACK | Freq: Every day | ORAL | Status: DC | PRN

## 2020-03-09 MED ORDER — DEXAMETHASONE SODIUM PHOSPHATE 10 MG/ML IJ SOLN
INTRAMUSCULAR | Status: DC | PRN
  Administered 2020-03-09: 8 mg via INTRAVENOUS

## 2020-03-09 MED ORDER — DEXAMETHASONE SODIUM PHOSPHATE 10 MG/ML IJ SOLN
INTRAMUSCULAR | Status: AC
  Filled 2020-03-09: qty 1

## 2020-03-09 MED ORDER — HYDROMORPHONE HCL 1 MG/ML IJ SOLN
INTRAMUSCULAR | Status: DC | PRN
Start: 2020-03-09 — End: 2020-03-09
  Administered 2020-03-09: 1 mg via INTRAVENOUS

## 2020-03-09 MED ORDER — IRRISEPT - 450ML BOTTLE WITH 0.05% CHG IN STERILE WATER, USP 99.95% OPTIME
TOPICAL | Status: DC | PRN
  Administered 2020-03-09: 450 mL

## 2020-03-09 MED ORDER — HYDROMORPHONE HCL 2 MG/ML IJ SOLN
INTRAMUSCULAR | Status: AC
  Filled 2020-03-09: qty 1

## 2020-03-09 MED ORDER — SUGAMMADEX SODIUM 200 MG/2ML IV SOLN
INTRAVENOUS | Status: DC | PRN
  Administered 2020-03-09: 200 mg via INTRAVENOUS

## 2020-03-09 MED ORDER — ESMOLOL HCL 100 MG/10ML IV SOLN
INTRAVENOUS | Status: DC | PRN
  Administered 2020-03-09 (×3): 30 mg via INTRAVENOUS

## 2020-03-09 MED ORDER — FENTANYL CITRATE (PF) 250 MCG/5ML IJ SOLN
INTRAMUSCULAR | Status: AC
  Filled 2020-03-09: qty 5

## 2020-03-09 MED ORDER — HEPARIN (PORCINE) 25000 UT/250ML-% IV SOLN
1350.0000 [IU]/h | INTRAVENOUS | Status: DC
  Administered 2020-03-10 – 2020-03-11 (×3): 1500 [IU]/h via INTRAVENOUS
  Administered 2020-03-12: 1350 [IU]/h via INTRAVENOUS
  Filled 2020-03-09 (×4): qty 250

## 2020-03-09 MED ORDER — PROPOFOL 10 MG/ML IV BOLUS
INTRAVENOUS | Status: AC
  Filled 2020-03-09: qty 20

## 2020-03-09 MED ORDER — BISACODYL 10 MG RE SUPP: 10.0000 mg | Freq: Every day | RECTAL | Status: DC | PRN

## 2020-03-09 MED ORDER — MENTHOL 3 MG MT LOZG: 1.0000 | LOZENGE | OROMUCOSAL | Status: DC | PRN

## 2020-03-09 MED ORDER — SODIUM CHLORIDE 0.9 % IV SOLN: INTRAVENOUS | Status: DC | PRN

## 2020-03-09 MED ORDER — DEXAMETHASONE SODIUM PHOSPHATE 10 MG/ML IJ SOLN
10.0000 mg | Freq: Once | INTRAMUSCULAR | Status: AC
  Administered 2020-03-10: 10 mg via INTRAVENOUS
  Filled 2020-03-09: qty 1

## 2020-03-09 MED ORDER — PROPOFOL 10 MG/ML IV BOLUS
INTRAVENOUS | Status: DC | PRN
  Administered 2020-03-09: 150 mg via INTRAVENOUS

## 2020-03-09 MED ORDER — MORPHINE SULFATE (PF) 2 MG/ML IV SOLN
0.5000 mg | INTRAVENOUS | Status: DC | PRN
  Administered 2020-03-09 (×2): 1 mg via INTRAVENOUS
  Filled 2020-03-09 (×2): qty 1

## 2020-03-09 MED ORDER — MAGNESIUM CITRATE PO SOLN: 1.0000 | Freq: Once | ORAL | Status: DC | PRN

## 2020-03-09 MED ORDER — ONDANSETRON HCL 4 MG/2ML IJ SOLN
INTRAMUSCULAR | Status: DC | PRN
  Administered 2020-03-09: 4 mg via INTRAVENOUS

## 2020-03-09 MED ORDER — SODIUM CHLORIDE 0.9 % IV SOLN: INTRAVENOUS | Status: DC

## 2020-03-09 MED ORDER — LIDOCAINE 2% (20 MG/ML) 5 ML SYRINGE
INTRAMUSCULAR | Status: DC | PRN
  Administered 2020-03-09: 60 mg via INTRAVENOUS

## 2020-03-09 MED ORDER — STERILE WATER FOR IRRIGATION IR SOLN
Status: DC | PRN
  Administered 2020-03-09: 2000 mL

## 2020-03-09 MED ORDER — PHENYLEPHRINE HCL (PRESSORS) 10 MG/ML IV SOLN
INTRAVENOUS | Status: AC
  Filled 2020-03-09: qty 1

## 2020-03-09 MED ORDER — ONDANSETRON HCL 4 MG/2ML IJ SOLN
INTRAMUSCULAR | Status: AC
  Filled 2020-03-09: qty 2

## 2020-03-09 SURGICAL SUPPLY — 68 items
BAG ZIPLOCK 12X15 (MISCELLANEOUS) ×3 IMPLANT
BIT DRILL 2.8X128 (BIT) ×2 IMPLANT
BIT DRILL 2.8X128MM (BIT) ×1
BLADE EXTENDED COATED 6.5IN (ELECTRODE) ×3 IMPLANT
BNDG GAUZE ELAST 4 BULKY (GAUZE/BANDAGES/DRESSINGS) ×2 IMPLANT
CLOSURE WOUND 1/2 X4 (GAUZE/BANDAGES/DRESSINGS) ×2
COVER SURGICAL LIGHT HANDLE (MISCELLANEOUS) ×3 IMPLANT
COVER WAND RF STERILE (DRAPES) ×2 IMPLANT
DRAPE INCISE IOBAN 66X45 STRL (DRAPES) ×3 IMPLANT
DRAPE ORTHO SPLIT 77X108 STRL (DRAPES) ×6
DRAPE POUCH INSTRU U-SHP 10X18 (DRAPES) ×3 IMPLANT
DRAPE SURG ORHT 6 SPLT 77X108 (DRAPES) ×2 IMPLANT
DRAPE U-SHAPE 47X51 STRL (DRAPES) ×3 IMPLANT
DRSG AQUACEL AG ADV 3.5X10 (GAUZE/BANDAGES/DRESSINGS) ×2 IMPLANT
DRSG EMULSION OIL 3X16 NADH (GAUZE/BANDAGES/DRESSINGS) ×3 IMPLANT
DRSG MEPILEX BORDER 4X4 (GAUZE/BANDAGES/DRESSINGS) ×3 IMPLANT
DRSG MEPILEX BORDER 4X8 (GAUZE/BANDAGES/DRESSINGS) ×3 IMPLANT
DURAPREP 26ML APPLICATOR (WOUND CARE) ×3 IMPLANT
ELECT REM PT RETURN 15FT ADLT (MISCELLANEOUS) ×3 IMPLANT
EVACUATOR 1/8 PVC DRAIN (DRAIN) ×2 IMPLANT
FACESHIELD WRAPAROUND (MASK) ×12 IMPLANT
FACESHIELD WRAPAROUND OR TEAM (MASK) ×4 IMPLANT
GAUZE SPONGE 4X4 12PLY STRL (GAUZE/BANDAGES/DRESSINGS) ×3 IMPLANT
GLOVE BIO SURGEON STRL SZ7.5 (GLOVE) ×3 IMPLANT
GLOVE BIO SURGEON STRL SZ8 (GLOVE) ×3 IMPLANT
GLOVE BIOGEL PI IND STRL 7.0 (GLOVE) ×1 IMPLANT
GLOVE BIOGEL PI IND STRL 8 (GLOVE) ×1 IMPLANT
GLOVE BIOGEL PI INDICATOR 7.0 (GLOVE) ×2
GLOVE BIOGEL PI INDICATOR 8 (GLOVE) ×2
GLOVE SURG SS PI 7.0 STRL IVOR (GLOVE) ×3 IMPLANT
GOWN STRL REUS W/TWL LRG LVL3 (GOWN DISPOSABLE) ×9 IMPLANT
HANDPIECE INTERPULSE COAX TIP (DISPOSABLE) ×3
HEAD M SROM 36MM PLUS 3 (Hips) IMPLANT
IMMOBILIZER KNEE 20 (SOFTGOODS) IMPLANT
IMMOBILIZER KNEE 20 THIGH 36 (SOFTGOODS) IMPLANT
KIT BASIN OR (CUSTOM PROCEDURE TRAY) ×3 IMPLANT
KIT TURNOVER KIT A (KITS) IMPLANT
LINER MARATHON NEUT +4X58X36 (Hips) ×2 IMPLANT
MANIFOLD NEPTUNE II (INSTRUMENTS) ×3 IMPLANT
NDL SAFETY ECLIPSE 18X1.5 (NEEDLE) ×1 IMPLANT
NEEDLE HYPO 18GX1.5 SHARP (NEEDLE) ×3
NS IRRIG 1000ML POUR BTL (IV SOLUTION) ×6 IMPLANT
PACK TOTAL JOINT (CUSTOM PROCEDURE TRAY) ×3 IMPLANT
PADDING CAST COTTON 6X4 STRL (CAST SUPPLIES) ×6 IMPLANT
PASSER SUT SWANSON 36MM LOOP (INSTRUMENTS) ×3 IMPLANT
PENCIL SMOKE EVACUATOR (MISCELLANEOUS) ×2 IMPLANT
PROTECTOR NERVE ULNAR (MISCELLANEOUS) ×3 IMPLANT
SET HNDPC FAN SPRY TIP SCT (DISPOSABLE) IMPLANT
SPONGE LAP 18X18 RF (DISPOSABLE) ×2 IMPLANT
SROM M HEAD 36MM PLUS 3 (Hips) ×3 IMPLANT
STRIP CLOSURE SKIN 1/2X4 (GAUZE/BANDAGES/DRESSINGS) ×4 IMPLANT
SUCTION FRAZIER HANDLE 10FR (MISCELLANEOUS) ×3
SUCTION TUBE FRAZIER 10FR DISP (MISCELLANEOUS) ×1 IMPLANT
SUT ETHIBOND NAB CT1 #1 30IN (SUTURE) ×6 IMPLANT
SUT ETHILON 2 0 PS N (SUTURE) ×2 IMPLANT
SUT MNCRL AB 4-0 PS2 18 (SUTURE) ×3 IMPLANT
SUT VIC AB 1 CT1 27 (SUTURE) ×9
SUT VIC AB 1 CT1 27XBRD ANTBC (SUTURE) ×3 IMPLANT
SUT VIC AB 2-0 CT1 27 (SUTURE) ×9
SUT VIC AB 2-0 CT1 TAPERPNT 27 (SUTURE) ×3 IMPLANT
SUT VLOC 180 0 24IN GS25 (SUTURE) ×3 IMPLANT
SWAB COLLECTION DEVICE MRSA (MISCELLANEOUS) ×5 IMPLANT
SWAB CULTURE ESWAB REG 1ML (MISCELLANEOUS) ×5 IMPLANT
SYR 20ML LL LF (SYRINGE) ×3 IMPLANT
SYR 50ML LL SCALE MARK (SYRINGE) ×3 IMPLANT
TOWEL OR 17X26 10 PK STRL BLUE (TOWEL DISPOSABLE) ×6 IMPLANT
TRAY FOLEY MTR SLVR 16FR STAT (SET/KITS/TRAYS/PACK) ×3 IMPLANT
WATER STERILE IRR 1000ML POUR (IV SOLUTION) ×3 IMPLANT

## 2020-03-09 NOTE — Anesthesia Procedure Notes (Signed)
Procedure Name: Intubation Date/Time: 03/09/2020 4:24 PM Performed by: Lissa Morales, CRNA Pre-anesthesia Checklist: Patient identified, Emergency Drugs available, Suction available and Patient being monitored Patient Re-evaluated:Patient Re-evaluated prior to induction Oxygen Delivery Method: Circle system utilized Preoxygenation: Pre-oxygenation with 100% oxygen Induction Type: IV induction, Rapid sequence and Cricoid Pressure applied Ventilation: Mask ventilation without difficulty Laryngoscope Size: Mac and 4 Grade View: Grade I Tube type: Oral Number of attempts: 1 Airway Equipment and Method: Stylet and Oral airway Placement Confirmation: ETT inserted through vocal cords under direct vision,  positive ETCO2 and breath sounds checked- equal and bilateral Tube secured with: Tape Dental Injury: Teeth and Oropharynx as per pre-operative assessment

## 2020-03-09 NOTE — Progress Notes (Signed)
PROGRESS NOTE    CLARE CASTO  ZSM:[id number] DOB: 1945-09-10 DOA: 03/03/2020 PCP: Luetta Nutting, DO   No chief complaint on file.  Brief Narrative:  [name] is [name] 74 y.o. male with medical history significant of coronary artery disease, diabetes type 2, hypertension, anemia, CKD stage IIIb, osteoarthritis status post left hip arthroplasty, renal stone/hydronephrosis status post stents, prostate cancer ,paroxysmal atrial flutter not on anticoagulation who was sent for direct admission to [facility] from [facility],Dr Aluisio.  Patient was seen in the emergency department on 03/01/2020 for left hip pain.  There was no report of injury.  He had CT left hip done which showed  fluid collection.  He was found to have elevated ESR and CRP.  He was discharged from the emergency department to follow-up with orthopedics as per orthopedics recommendation.   In the orthopedics office, he reported chills, nausea/diarrhea and increased left hip pain.  He was hardly able to walk.  He was afebrile.  Patient was requested for direct admission by orthopedics team for drainage or intervention of the left hip fluid collection.  Lab work also showed elevated ESR and CRP. Patient seen and examined at the bedside this evening.  During my evaluation, he was hemodynamically stable and overall comfortable.  He was hypertensive.  He complained of severe pain on his left hip and the left ear was found to be tender.  He denied any fever, chest pain, shortness of breath, cough, abdomen pain, nausea, vomiting, dysuria, hematochezia or melena.  He ives alone.  Assessment & Plan:   Active Problems:   OA (osteoarthritis) of hip   Type 2 diabetes mellitus with hypoglycemia without coma (HCC)   Essential hypertension   Hyperlipidemia with target LDL less than 70   Hip pain   Effusion of hip joint, left   Abdominal distension   SBO (small bowel obstruction) (HCC)    Bacteremia  Enterobacter Cloacae Bacteremia  Emphysematous Pyelitis  Left Hip Septic Arthritis/Prosthetic Joint Infection CT left hip showed large fluid and air containing collection surroudning the proximal femoral component measuring up to 8.7 cm, concerning for infection/abscess.  Fluid and air collection track within the L adductor muscle compartment extending towards the symphysis.  Periprosthetic lucency along the posterior cortex of the proximal femoral stem extending distally by approximately 7.5 cm, concerning for septic loosening.  Area of lucency along the anterior aspect of the proximal native femur with air seen within bone, concerning for osteo. S/p CT guided placed 10 F drainage catheter placement  Culture with abundant enterobacter cloacae (resistant to cefazolin) Blood culture with 1 of 2 sets positive for enterobacter cloacae (resistant to cefazolin) Urine cx with enterobacter species (resistant to cefazolin) Ortho s/p I&D L hip with liner exchange on 11/29 ID consult, appreciate recs -> recommending transition to cefepime.  Repeat blood cx.  Planning for PICC, possibly tomorrow.  Will need 6 weeks IV abx, followed by oral abx. urology recommending continue abx, consider nephrostomy tube if new onset fevers/decreased uop/worsening creatinine  Partial SBO vs Ileus: s/p NG tube placement for diffusely gas distended bowel Follow CT abdomen pelvis -> tiny richter hernia containing Zaniah Titterington very short loop of small bowel with an associated early/partial SBO. KUB 11/26 with mildly dilated small bowel loops Surgery c/s, appreciate recs - suspects this was ileus 2/2 medical issues that appears to be resolving -> full liquid diet, advance as tolerated  Paroxysmal Janathan [name]. fib/atrial flutter: Follows with Dr [name].  Not on any  anticoagulation at home because of anemia. chadvasc at leat 3. Rate controlled  Appreciate cardiology assistance - plan for transition to oral dilt tomorrow Heparin gtt   Transition to dilt gtt, low dose oral metop, IV metop prn  Follow repeat echo - EF 55-60%, no RWMA (see report)  Diabetes type 2:Hemoglobin A1c of 7.1 as per 9/21. SSI - hold home amaryl and januvia Adjust prn  AKI on CKD stage IIIb  Anion Gap Metabolic Acidosis: His kidney function has fluctuated over several months from 1.5-3, unclear baseline.  Continue to monitor kidney function.  Avoid nephrotoxins.  He needs to follow-up with nephrology as an outpatient. Creatinine up to 2.91 on 11/25, continue IVF, creatinine improving Acidosis resolved  History of severe left hydroureteronephrosis/prostate cancer: Follows with urology.  He is status post bilateral ureteral stent placement by Dr. [name] 10/6.  Currently prostate cancer is in remission.  Normocytic anemia: Most likely associated  with chronic kidney disease, chronic medical problems.  Hemoglobin currently stable .  No report of hematochezia or melena.  Coronary artery disease: Takes aspirin.  continue to hold in preparation for procedure. Denies any chest pain.    Hypertension:  continue dilt and metop as noted above  Hyperlipidemia: Takes Lipitor.  GERD: Continue Protonix  Left hip pain/debility: We will request for PT/OT evaluation after procedure.  DVT prophylaxis: heparin Code Status: full  Family Communication: shelby at bedside Disposition:   Status is: inpatient  The patient will require care spanning > 2 midnights and should be moved to inpatient because: Inpatient level of care appropriate due to severity of illness  Dispo: The patient is from: Home              Anticipated d/c is to: pending              Anticipated d/c date is: > 3 days              Patient currently is not medically stable to d/c.   Consultants:   IR  Ortho  Procedures: IR IMPRESSION: Successful CT guided placement of Malaya Cagley 12 French all purpose drain catheter into the complex fluid collection involving the proximal lateral  aspect the left thigh with aspiration of 50 cc of purulent fluid. Samples were sent to the laboratory as requested by the ordering clinical team.  Echo IMPRESSIONS    1. Left ventricular ejection fraction, by estimation, is 55 to 60%. The  left ventricle has normal function. The left ventricle has no regional  wall motion abnormalities. There is mild concentric left ventricular  hypertrophy. Left ventricular diastolic  function could not be evaluated.  2. Right ventricular systolic function is normal. The right ventricular  size is normal.  3. Left atrial size was mildly dilated.  4. The pericardial effusion is circumferential.  5. The mitral valve is normal in structure. Mild mitral valve  regurgitation. No evidence of mitral stenosis.  6. The aortic valve is normal in structure. Aortic valve regurgitation is  not visualized. Mild aortic valve sclerosis is present, with no evidence  of aortic valve stenosis.  7. The inferior vena cava is normal in size with greater than 50%  respiratory variability, suggesting right atrial pressure of 3 mmHg.   Comparison(s): No significant change from prior study. Prior images  reviewed side by side.  Antimicrobials: Anti-infectives (From admission, onward)   Start     Dose/Rate Route Frequency Ordered Stop   03/06/20 1700  [MAR Hold]  ceFEPIme (MAXIPIME) 2 g  in sodium chloride 0.9 % 100 mL IVPB        (MAR Hold since Mon 03/09/2020 at 1302.Hold Reason: Transfer to [facility] Procedural area.)   2 g 200 mL/hr over 30 Minutes Intravenous Every 12 hours 03/06/20 1340     03/05/20 1000  piperacillin-tazobactam (ZOSYN) IVPB 3.375 g  Status:  Discontinued        3.375 g 12.5 mL/hr over 240 Minutes Intravenous Every 8 hours 03/05/20 0904 03/06/20 1311      Subjective: No new complaints  Objective: Vitals:   03/09/20 0300 03/09/20 0400 03/09/20 0430 03/09/20 1308  BP: 125/74 (!) 148/66  (!) 145/75  Pulse: 72 75  78  Resp: '16 14  18  ' Temp:   98.2 F (36.8 C)  99.1 F (37.3 C)  TempSrc:  Oral  Oral  SpO2: 97% 96%  94%  Weight:   101.1 kg   Height:   '5\' 9"'  (1.753 m)     Intake/Output Summary (Last 24 hours) at 03/09/2020 1738 Last data filed at 03/09/2020 1658 Gross per 24 hour  Intake 3705.57 ml  Output 2100 ml  Net 1605.57 ml   Filed Weights   03/03/20 1855 03/07/20 0500 03/09/20 0430  Weight: 110.3 kg 95.8 kg 101.1 kg    Examination:  General: No acute distress. Cardiovascular: Heart sounds show Saahil Herbster regular rate, and rhythm. Lungs: Clear to auscultation bilaterally  Abdomen: Soft, nontender, distended. Neurological: Alert and oriented 3. Moves all extremities 4. Cranial nerves II through XII grossly intact. Skin: Warm and dry. No rashes or lesions. Extremities: L hip with drain    Data Reviewed: I have personally reviewed following labs and imaging studies  CBC: Recent Labs  Lab 03/05/20 0428 03/06/20 0325 03/07/20 0659 03/08/20 0610 03/09/20 0419  WBC 7.4 9.9 7.9 7.3 7.6  NEUTROABS 6.4 8.3* 6.4 5.8 6.0  HGB 10.3* 9.9* 9.4* 8.8* 9.0*  HCT 32.1* 30.2* 29.2* 28.4* 28.6*  MCV 92.8 94.4 94.2 96.3 94.7  PLT 601* 605* 573* 467* 500*    Basic Metabolic Panel: Recent Labs  Lab 03/05/20 0428 03/06/20 0325 03/07/20 0659 03/08/20 0610 03/09/20 0419  NA 138 138 140 140 138  K 4.6 4.3 3.9 4.1 4.4  CL 105 106 102 101 101  CO2 18* 16* '25 26 25  ' GLUCOSE 186* 191* 241* 158* 148*  BUN 117* 132* 131* 87* 57*  CREATININE 2.91* 2.70* 2.55* 1.82* 1.44*  CALCIUM 8.6* 8.4* 8.0* 8.2* 8.3*  MG 3.4* 3.3* 3.1* 2.8* 2.3  PHOS 6.5* 6.3* 5.0* 3.4 2.7    GFR: Estimated Creatinine Clearance: 52.8 mL/min (Any Mcneice) (by C-G formula based on SCr of 1.44 mg/dL (H)).  Liver Function Tests: Recent Labs  Lab 03/05/20 0428 03/06/20 0325 03/07/20 0659 03/08/20 0610 03/09/20 0419  AST 14* '15 16 22 ' 44*  ALT '21 19 14 17 ' 33  ALKPHOS 157* 143* 116 101 94  BILITOT 0.8 0.8 0.6 0.5 0.6  PROT 8.4* 8.1 7.4 7.3 7.1  ALBUMIN  2.6* 2.6* 2.4* 2.4* 2.3*    CBG: Recent Labs  Lab 03/08/20 1130 03/08/20 1634 03/08/20 2118 03/09/20 0905 03/09/20 1202  GLUCAP 175* 140* 158* 146* 139*     Recent Results (from the past 240 hour(s))  Resp Panel by RT-PCR (Flu Dolce Sylvia&B, Covid) Nasopharyngeal Swab     Status: None   Collection Time: 03/01/20 12:20 PM   Specimen: Nasopharyngeal Swab; Nasopharyngeal(NP) swabs in vial transport medium  Result Value Ref Range Status   SARS Coronavirus 2 by RT  PCR NEGATIVE NEGATIVE Final    Comment: (NOTE) SARS-CoV-2 target nucleic acids are NOT DETECTED.  The SARS-CoV-2 RNA is generally detectable in upper respiratory specimens during the acute phase of infection. The lowest concentration of SARS-CoV-2 viral copies this assay can detect is 138 copies/mL. Brindley Madarang negative result does not preclude SARS-Cov-2 infection and should not be used as the sole basis for treatment or other patient management decisions. Darica Goren negative result may occur with  improper specimen collection/handling, submission of specimen other than nasopharyngeal swab, presence of viral mutation(s) within the areas targeted by this assay, and inadequate number of viral copies(<138 copies/mL). Lilymae Swiech negative result must be combined with clinical observations, patient history, and epidemiological information. The expected result is Negative.  Fact Sheet for Patients:  EntrepreneurPulse.com.au  Fact Sheet for Healthcare Providers:  IncredibleEmployment.be  This test is no t yet approved or cleared by the [place] FDA and  has been authorized for detection and/or diagnosis of SARS-CoV-2 by FDA under an Emergency Use Authorization (EUA). This EUA will remain  in effect (meaning this test can be used) for the duration of the COVID-19 declaration under Section 564(b)(1) of the Act, 21 U.S.C.section 360bbb-3(b)(1), unless the authorization is terminated  or revoked sooner.        Influenza Aleen Marston by PCR NEGATIVE NEGATIVE Final   Influenza B by PCR NEGATIVE NEGATIVE Final    Comment: (NOTE) The Xpert Xpress SARS-CoV-2/FLU/RSV plus assay is intended as an aid in the diagnosis of influenza from Nasopharyngeal swab specimens and should not be used as Erdine Hulen sole basis for treatment. Nasal washings and aspirates are unacceptable for Xpert Xpress SARS-CoV-2/FLU/RSV testing.  Fact Sheet for Patients: EntrepreneurPulse.com.au  Fact Sheet for Healthcare Providers: IncredibleEmployment.be  This test is not yet approved or cleared by the [place] FDA and has been authorized for detection and/or diagnosis of SARS-CoV-2 by FDA under an Emergency Use Authorization (EUA). This EUA will remain in effect (meaning this test can be used) for the duration of the COVID-19 declaration under Section 564(b)(1) of the Act, 21 U.S.C. section 360bbb-3(b)(1), unless the authorization is terminated or revoked.  Performed at [facility], [place] [address], [place], [place] [address]   Culture, blood (routine x 2)     Status: Abnormal   Collection Time: 03/03/20  7:04 PM   Specimen: BLOOD LEFT HAND  Result Value Ref Range Status   Specimen Description   Final    BLOOD LEFT HAND Performed at [place] [address], [place], [place] [address]    Special Requests   Final    BOTTLES DRAWN AEROBIC ONLY Blood Culture adequate volume Performed at [place] [address] [place], [place] [address]    Culture  Setup Time   Final    AEROBIC BOTTLE ONLY GRAM NEGATIVE RODS CRITICAL RESULT CALLED TO, READ BACK BY AND VERIFIED WITH: Ulice Bold PharmD 9:20 03/06/20 ([email or web address]) Performed at [facility], [place] [address], [place], [place] [address]    Culture ENTEROBACTER CLOACAE (Jaxx Huish)  Final   Report Status 03/08/2020 FINAL  Final   Organism ID, Bacteria ENTEROBACTER CLOACAE  Final       Susceptibility   Enterobacter cloacae - MIC*    CEFAZOLIN >=64 RESISTANT Resistant     CEFEPIME <=0.12 SENSITIVE Sensitive     CEFTAZIDIME <=1 SENSITIVE Sensitive     CIPROFLOXACIN <=0.25 SENSITIVE Sensitive     GENTAMICIN <=1 SENSITIVE Sensitive     IMIPENEM <=0.25  SENSITIVE Sensitive     TRIMETH/SULFA <=20 SENSITIVE Sensitive     PIP/TAZO <=4 SENSITIVE Sensitive     * ENTEROBACTER CLOACAE  Blood Culture ID Panel (Reflexed)     Status: Abnormal   Collection Time: 03/03/20  7:04 PM  Result Value Ref Range Status   Enterococcus faecalis NOT DETECTED NOT DETECTED Final   Enterococcus Faecium NOT DETECTED NOT DETECTED Final   Listeria monocytogenes NOT DETECTED NOT DETECTED Final   Staphylococcus species NOT DETECTED NOT DETECTED Final   Staphylococcus aureus (BCID) NOT DETECTED NOT DETECTED Final   Staphylococcus epidermidis NOT DETECTED NOT DETECTED Final   Staphylococcus lugdunensis NOT DETECTED NOT DETECTED Final   Streptococcus species NOT DETECTED NOT DETECTED Final   Streptococcus agalactiae NOT DETECTED NOT DETECTED Final   Streptococcus pneumoniae NOT DETECTED NOT DETECTED Final   Streptococcus pyogenes NOT DETECTED NOT DETECTED Final   Theodosia Bahena.calcoaceticus-baumannii NOT DETECTED NOT DETECTED Final   Bacteroides fragilis NOT DETECTED NOT DETECTED Final   Enterobacterales DETECTED (Jahvon Gosline) NOT DETECTED Final    Comment: Enterobacterales represent Takayla Baillie large order of gram negative bacteria, not Tina Gruner single organism. CRITICAL RESULT CALLED TO, READ BACK BY AND VERIFIED WITH: Ulice Bold PharmD 9:20 03/06/20 ([email or web address])    Enterobacter cloacae complex DETECTED (Flora Parks) NOT DETECTED Final    Comment: CRITICAL RESULT CALLED TO, READ BACK BY AND VERIFIED WITH: Ulice Bold PharmD 9:20 03/06/20 ([email or web address])    Escherichia coli NOT DETECTED NOT DETECTED Final   Klebsiella aerogenes NOT DETECTED NOT DETECTED Final   Klebsiella oxytoca NOT DETECTED NOT DETECTED Final   Klebsiella pneumoniae NOT DETECTED NOT  DETECTED Final   Proteus species NOT DETECTED NOT DETECTED Final   Salmonella species NOT DETECTED NOT DETECTED Final   Serratia marcescens NOT DETECTED NOT DETECTED Final   Haemophilus influenzae NOT DETECTED NOT DETECTED Final   Neisseria meningitidis NOT DETECTED NOT DETECTED Final   Pseudomonas aeruginosa NOT DETECTED NOT DETECTED Final   Stenotrophomonas maltophilia NOT DETECTED NOT DETECTED Final   Candida albicans NOT DETECTED NOT DETECTED Final   Candida auris NOT DETECTED NOT DETECTED Final   Candida glabrata NOT DETECTED NOT DETECTED Final   Candida krusei NOT DETECTED NOT DETECTED Final   Candida parapsilosis NOT DETECTED NOT DETECTED Final   Candida tropicalis NOT DETECTED NOT DETECTED Final   Cryptococcus neoformans/gattii NOT DETECTED NOT DETECTED Final   CTX-M ESBL NOT DETECTED NOT DETECTED Final   Carbapenem resistance IMP NOT DETECTED NOT DETECTED Final   Carbapenem resistance KPC NOT DETECTED NOT DETECTED Final   Carbapenem resistance NDM NOT DETECTED NOT DETECTED Final   Carbapenem resist OXA 48 LIKE NOT DETECTED NOT DETECTED Final   Carbapenem resistance VIM NOT DETECTED NOT DETECTED Final    Comment: Performed at [facility], [address] [place], [place] [address]  Culture, blood (routine x 2)     Status: None   Collection Time: 03/03/20  7:06 PM   Specimen: BLOOD RIGHT HAND  Result Value Ref Range Status   Specimen Description   Final    BLOOD RIGHT HAND Performed at [place] [address], [place], [place] [address]    Special Requests   Final    BOTTLES DRAWN AEROBIC ONLY Blood Culture adequate volume Performed at [place] [address] [place],  [address]    Culture   Final    NO GROWTH 5 DAYS Performed at [facility], [place] [address], [place],  [address]  Report Status 03/08/2020 FINAL  Final  Aerobic/Anaerobic Culture (surgical/deep wound)     Status: None    Collection Time: 03/04/20  3:38 PM   Specimen: Abscess  Result Value Ref Range Status   Specimen Description   Final    ABSCESS DRAIN LEFT LATERAL THIGH Performed at [place] [address], [place], [place] [address]    Special Requests   Final    NONE Performed at [facility], [place] [address] [place], [place] [address]    Gram Stain   Final    ABUNDANT WBC PRESENT,BOTH PMN AND MONONUCLEAR ABUNDANT GRAM NEGATIVE RODS    Culture   Final    ABUNDANT ENTEROBACTER CLOACAE NO ANAEROBES ISOLATED Performed at [facility], [place] [address] [place], [place] [address]    Report Status 03/09/2020 FINAL  Final   Organism ID, Bacteria ENTEROBACTER CLOACAE  Final      Susceptibility   Enterobacter cloacae - MIC*    CEFAZOLIN >=64 RESISTANT Resistant     CEFEPIME <=0.12 SENSITIVE Sensitive     CEFTAZIDIME <=1 SENSITIVE Sensitive     CIPROFLOXACIN <=0.25 SENSITIVE Sensitive     GENTAMICIN <=1 SENSITIVE Sensitive     IMIPENEM 0.5 SENSITIVE Sensitive     TRIMETH/SULFA <=20 SENSITIVE Sensitive     PIP/TAZO <=4 SENSITIVE Sensitive     * ABUNDANT ENTEROBACTER CLOACAE  Culture, Urine     Status: Abnormal   Collection Time: 03/05/20  8:10 AM   Specimen: Urine, Random  Result Value Ref Range Status   Specimen Description   Final    URINE, RANDOM Performed at [facility], [place] [address], [place], [place] [address]    Special Requests   Final    NONE Performed at [facility], [place] [address] [place], [place] [address]    Culture >=100,000 COLONIES/mL ENTEROBACTER SPECIES (Eliah Ozawa)  Final   Report Status 03/07/2020 FINAL  Final   Organism ID, Bacteria ENTEROBACTER SPECIES (Tajon Moring)  Final      Susceptibility   Enterobacter species - MIC*    CEFAZOLIN >=64 RESISTANT Resistant     CEFEPIME <=0.12 SENSITIVE Sensitive     CEFTRIAXONE <=0.25 SENSITIVE Sensitive     CIPROFLOXACIN <=0.25 SENSITIVE Sensitive      GENTAMICIN <=1 SENSITIVE Sensitive     IMIPENEM 0.5 SENSITIVE Sensitive     NITROFURANTOIN 32 SENSITIVE Sensitive     TRIMETH/SULFA <=20 SENSITIVE Sensitive     PIP/TAZO <=4 SENSITIVE Sensitive     * >=100,000 COLONIES/mL ENTEROBACTER SPECIES         Radiology Studies: DG CHEST PORT 1 VIEW  Result Date: 03/08/2020 CLINICAL DATA:  Preoperative assessment. EXAM: PORTABLE CHEST 1 VIEW COMPARISON:  Chest radiograph dated 01/26/2018. FINDINGS: The heart is enlarged. Both lungs are clear. Degenerative changes are seen in the spine. IMPRESSION: Cardiomegaly without acute cardiopulmonary process. Electronically Signed   By: Zerita Boers M.D.   On: 03/08/2020 17:34        Scheduled Meds: . [MAR Hold] atorvastatin  40 mg Oral Daily  . [MAR Hold] bisacodyl  10 mg Rectal Daily  . [MAR Hold] Chlorhexidine Gluconate Cloth  6 each Topical Daily  . [MAR Hold] diclofenac Sodium  4 g Topical QID  . [MAR Hold] ferrous sulfate  325 mg Oral Q breakfast  . [MAR Hold] gabapentin  300 mg Oral TID  . [MAR Hold] insulin aspart  0-15 Units Subcutaneous TID WC  . [facility] Hold]  insulin aspart  0-5 Units Subcutaneous QHS  . [MAR Hold] lip balm  1 application Topical BID  . [MAR Hold] metoprolol tartrate  12.5 mg Oral BID  . [MAR Hold] pantoprazole  40 mg Oral Daily  . [MAR Hold] sodium chloride flush  5 mL Intracatheter Q8H   Continuous Infusions: . [MAR Hold] ceFEPime (MAXIPIME) IV Stopped (03/09/20 0700)  . [MAR Hold] diltiazem (CARDIZEM) infusion 15 mg/hr (03/09/20 1701)  . lactated ringers 125 mL/hr at 03/09/20 0445  . lactated ringers Stopped (03/09/20 1500)     LOS: 5 days    Time spent: over 30 min    Fayrene Helper, MD [facility]   To contact the attending provider between 7A-7P or the covering provider during after hours 7P-7A, please log into the web site [email or web address] and access using universal [place] password for that web site. If you do not have the password, please  call the hospital operator.  03/09/2020, 5:38 PM

## 2020-03-09 NOTE — Progress Notes (Signed)
Pharmacy Antibiotic Note  [name] is a 74 y.o. male presented to the ED on 03/03/2020 from [facility] for management of septic left hip with plan for I&D on 03/09/20. He underwent drainage cath placement into the hip abscess on 11/24 with culture growing out enterobacter cloacae.  Blood culture collected on 11/23 is also positive for enterobacter cloacae.  Patient was started on zosyn on 11/25. Pharmacy consulted on 11/26 to change abx to cefepime.  Today, 03/09/2020: - Day #5 abx   - afeb, wbc wnl - scr down to 1.44 (crcl~53) - to OR today for Left hip  I&D and liner exchange  Plan: - continue cefepime 2gm IV q12h - monitor renal function closely ______________________________________  Height: 5\' 9"  (175.3 cm) Weight: 101.1 kg (222 lb 14.2 oz) IBW/kg (Calculated) : 70.7  Temp (24hrs), Avg:98.2 F (36.8 C), Min:97.9 F (36.6 C), Max:98.4 F (36.9 C)  Recent Labs  Lab 03/05/20 0428 03/06/20 0325 03/07/20 0659 03/08/20 0610 03/09/20 0419  WBC 7.4 9.9 7.9 7.3 7.6  CREATININE 2.91* 2.70* 2.55* 1.82* 1.44*    Estimated Creatinine Clearance: 52.8 mL/min (A) (by C-G formula based on SCr of 1.44 mg/dL (H)).    Allergies  Allergen Reactions  . Shellfish Allergy Rash   Antimicrobials this admission:  11/25 zosyn>>11/26 11/26 cefepime>> Dose adjustments this admission:   Microbiology results:  11/25 UCx: >100K enterobacter species (R= ancef) FINAL 11/24 left thigh abscess: enterobacter cloacae (R= ancef) FINAL 11/23 bcx x2: 1/2 GNR (BCID= Enterobacter cloacae complex); R= ancef  Thank you for allowing pharmacy to be a part of this patient's care.  Eudelia Bunch, Pharm.D 03/09/2020 8:54 AM

## 2020-03-09 NOTE — Progress Notes (Addendum)
Patient drank coffee and much later vomited coffee up once on day shift per day nurse.  No BM post suppository, pt passing gas.

## 2020-03-09 NOTE — Anesthesia Postprocedure Evaluation (Signed)
Anesthesia Post Note  Patient: [name]  Procedure(s) Performed: INCISION AND DRAINAGE LEFT HIP WITH LINER EXCHANGE (Left )     Patient location during evaluation: PACU Anesthesia Type: General Level of consciousness: awake Pain management: pain level controlled Vital Signs Assessment: post-procedure vital signs reviewed and stable Respiratory status: spontaneous breathing, nonlabored ventilation, respiratory function stable and patient connected to nasal cannula oxygen Cardiovascular status: blood pressure returned to baseline, stable and tachycardic Postop Assessment: no apparent nausea or vomiting Anesthetic complications: no Comments: Patient remained intermittently tachycardic despite max diltiazem drip.    No complications documented.  Last Vitals:  Vitals:   03/09/20 1815 03/09/20 1830  BP: (!) 185/78 (!) 165/90  Pulse: (!) 54 90  Resp: 18 20  Temp:    SpO2: 100% 97%    Last Pain:  Vitals:   03/09/20 1830  TempSrc:   PainSc: 0-No pain                 Yousif Edelson P Chelbi Herber

## 2020-03-09 NOTE — Progress Notes (Addendum)
Subjective: No new complaints   Antibiotics:  Anti-infectives (From admission, onward)   Start     Dose/Rate Route Frequency Ordered Stop   03/06/20 1700  [MAR Hold]  ceFEPIme (MAXIPIME) 2 g in sodium chloride 0.9 % 100 mL IVPB        (MAR Hold since Mon 03/09/2020 at 1302.Hold Reason: Transfer to a Procedural area.)   2 g 200 mL/hr over 30 Minutes Intravenous Every 12 hours 03/06/20 1340     03/05/20 1000  piperacillin-tazobactam (ZOSYN) IVPB 3.375 g  Status:  Discontinued        3.375 g 12.5 mL/hr over 240 Minutes Intravenous Every 8 hours 03/05/20 0904 03/06/20 1311      Medications: Scheduled Meds: . [MAR Hold] atorvastatin  40 mg Oral Daily  . [MAR Hold] bisacodyl  10 mg Rectal Daily  . [MAR Hold] Chlorhexidine Gluconate Cloth  6 each Topical Daily  . [MAR Hold] diclofenac Sodium  4 g Topical QID  . [MAR Hold] ferrous sulfate  325 mg Oral Q breakfast  . [MAR Hold] gabapentin  300 mg Oral TID  . [MAR Hold] insulin aspart  0-15 Units Subcutaneous TID WC  . [MAR Hold] insulin aspart  0-5 Units Subcutaneous QHS  . [MAR Hold] lip balm  1 application Topical BID  . [MAR Hold] metoprolol tartrate  12.5 mg Oral BID  . [MAR Hold] pantoprazole  40 mg Oral Daily  . [MAR Hold] sodium chloride flush  5 mL Intracatheter Q8H   Continuous Infusions: . [MAR Hold] ceFEPime (MAXIPIME) IV Stopped (03/09/20 0700)  . [MAR Hold] diltiazem (CARDIZEM) infusion 15 mg/hr (03/09/20 0537)  . lactated ringers 125 mL/hr at 03/09/20 0445  . lactated ringers 20 mL/hr at 03/09/20 1314   PRN Meds:.[MAR Hold] albuterol, [MAR Hold] alum & mag hydroxide-simeth, [MAR Hold] docusate sodium, [MAR Hold]  HYDROmorphone (DILAUDID) injection, [MAR Hold] magic mouthwash, [MAR Hold] menthol-cetylpyridinium, [MAR Hold] metoprolol tartrate, [MAR Hold] morphine, [MAR Hold] ondansetron (ZOFRAN) IV, [MAR Hold] phenol    Objective: Weight change:   Intake/Output Summary (Last 24 hours) at 03/09/2020  1342 Last data filed at 03/09/2020 0911 Gross per 24 hour  Intake 1579.18 ml  Output 2750 ml  Net -1170.82 ml   Blood pressure (!) 145/75, pulse 78, temperature 99.1 F (37.3 C), temperature source Oral, resp. rate 18, height 5\' 9"  (1.753 m), weight 101.1 kg, SpO2 94 %. Temp:  [97.9 F (36.6 C)-99.1 F (37.3 C)] 99.1 F (37.3 C) (11/29 1308) Pulse Rate:  [69-81] 78 (11/29 1308) Resp:  [14-21] 18 (11/29 1308) BP: (125-160)/(61-118) 145/75 (11/29 1308) SpO2:  [94 %-98 %] 94 % (11/29 1308) Weight:  [101.1 kg] 101.1 kg (11/29 0430)  Physical Exam: General: Alert and awake, oriented x3,  HEENT: anicteric sclera, EOMI CVS regular rate, normal  Chest: , no wheezing, no respiratory distress Abdomen: soft non-distended,   Skin: no rashes Neuro: nonfocal  CBC:    BMET Recent Labs    03/08/20 0610 03/09/20 0419  NA 140 138  K 4.1 4.4  CL 101 101  CO2 26 25  GLUCOSE 158* 148*  BUN 87* 57*  CREATININE 1.82* 1.44*  CALCIUM 8.2* 8.3*     Liver Panel  Recent Labs    03/08/20 0610 03/09/20 0419  PROT 7.3 7.1  ALBUMIN 2.4* 2.3*  AST 22 44*  ALT 17 33  ALKPHOS 101 94  BILITOT 0.5 0.6       Sedimentation Rate No results  for input(s): ESRSEDRATE in the last 72 hours. C-Reactive Protein No results for input(s): CRP in the last 72 hours.  Micro Results: Recent Results (from the past 720 hour(s))  Urine Culture     Status: None   Collection Time: 02/21/20 11:27 AM   Specimen: Urine  Result Value Ref Range Status   MICRO NUMBER: [id number]  Final   SPECIMEN QUALITY: Adequate  Final   Sample Source URINE, CLEAN CATCH  Final   STATUS: FINAL  Final   Result:   Final    Less than 10,000 CFU/mL of single Gram negative organism isolated. No further testing will be performed. If clinically indicated, recollection using a method to minimize contamination, with prompt transfer to Urine Culture Transport Tube, is recommended.  Resp Panel by RT-PCR (Flu A&B, Covid)  Nasopharyngeal Swab     Status: None   Collection Time: 03/01/20 12:20 PM   Specimen: Nasopharyngeal Swab; Nasopharyngeal(NP) swabs in vial transport medium  Result Value Ref Range Status   SARS Coronavirus 2 by RT PCR NEGATIVE NEGATIVE Final    Comment: (NOTE) SARS-CoV-2 target nucleic acids are NOT DETECTED.  The SARS-CoV-2 RNA is generally detectable in upper respiratory specimens during the acute phase of infection. The lowest concentration of SARS-CoV-2 viral copies this assay can detect is 138 copies/mL. A negative result does not preclude SARS-Cov-2 infection and should not be used as the sole basis for treatment or other patient management decisions. A negative result may occur with  improper specimen collection/handling, submission of specimen other than nasopharyngeal swab, presence of viral mutation(s) within the areas targeted by this assay, and inadequate number of viral copies(<138 copies/mL). A negative result must be combined with clinical observations, patient history, and epidemiological information. The expected result is Negative.  Fact Sheet for Patients:  EntrepreneurPulse.com.au  Fact Sheet for Healthcare Providers:  IncredibleEmployment.be  This test is no t yet approved or cleared by the [place] FDA and  has been authorized for detection and/or diagnosis of SARS-CoV-2 by FDA under an Emergency Use Authorization (EUA). This EUA will remain  in effect (meaning this test can be used) for the duration of the COVID-19 declaration under Section 564(b)(1) of the Act, 21 U.S.C.section 360bbb-3(b)(1), unless the authorization is terminated  or revoked sooner.       Influenza A by PCR NEGATIVE NEGATIVE Final   Influenza B by PCR NEGATIVE NEGATIVE Final    Comment: (NOTE) The Xpert Xpress SARS-CoV-2/FLU/RSV plus assay is intended as an aid in the diagnosis of influenza from Nasopharyngeal swab specimens and should not be  used as a sole basis for treatment. Nasal washings and aspirates are unacceptable for Xpert Xpress SARS-CoV-2/FLU/RSV testing.  Fact Sheet for Patients: EntrepreneurPulse.com.au  Fact Sheet for Healthcare Providers: IncredibleEmployment.be  This test is not yet approved or cleared by the [place] FDA and has been authorized for detection and/or diagnosis of SARS-CoV-2 by FDA under an Emergency Use Authorization (EUA). This EUA will remain in effect (meaning this test can be used) for the duration of the COVID-19 declaration under Section 564(b)(1) of the Act, 21 U.S.C. section 360bbb-3(b)(1), unless the authorization is terminated or revoked.  Performed at [facility], [place] [address], [place], [place] [address]   Culture, blood (routine x 2)     Status: Abnormal   Collection Time: 03/03/20  7:04 PM   Specimen: BLOOD LEFT HAND  Result Value Ref Range Status   Specimen Description   Final    BLOOD LEFT  HAND Performed at [facility], [place] [address], [place], [place] [address]    Special Requests   Final    BOTTLES DRAWN AEROBIC ONLY Blood Culture adequate volume Performed at [facility] [address] [place], [place] [address]    Culture  Setup Time   Final    AEROBIC BOTTLE ONLY GRAM NEGATIVE RODS CRITICAL RESULT CALLED TO, READ BACK BY AND VERIFIED WITH: Ulice Bold PharmD 9:20 03/06/20 ([email or web address]) Performed at [facility], [place] [address], [place], [place] [address]    Culture ENTEROBACTER CLOACAE (A)  Final   Report Status 03/08/2020 FINAL  Final   Organism ID, Bacteria ENTEROBACTER CLOACAE  Final      Susceptibility   Enterobacter cloacae - MIC*    CEFAZOLIN >=64 RESISTANT Resistant     CEFEPIME <=0.12 SENSITIVE Sensitive     CEFTAZIDIME <=1 SENSITIVE Sensitive     CIPROFLOXACIN <=0.25 SENSITIVE Sensitive     GENTAMICIN <=1 SENSITIVE Sensitive     IMIPENEM <=0.25  SENSITIVE Sensitive     TRIMETH/SULFA <=20 SENSITIVE Sensitive     PIP/TAZO <=4 SENSITIVE Sensitive     * ENTEROBACTER CLOACAE  Blood Culture ID Panel (Reflexed)     Status: Abnormal   Collection Time: 03/03/20  7:04 PM  Result Value Ref Range Status   Enterococcus faecalis NOT DETECTED NOT DETECTED Final   Enterococcus Faecium NOT DETECTED NOT DETECTED Final   Listeria monocytogenes NOT DETECTED NOT DETECTED Final   Staphylococcus species NOT DETECTED NOT DETECTED Final   Staphylococcus aureus (BCID) NOT DETECTED NOT DETECTED Final   Staphylococcus epidermidis NOT DETECTED NOT DETECTED Final   Staphylococcus lugdunensis NOT DETECTED NOT DETECTED Final   Streptococcus species NOT DETECTED NOT DETECTED Final   Streptococcus agalactiae NOT DETECTED NOT DETECTED Final   Streptococcus pneumoniae NOT DETECTED NOT DETECTED Final   Streptococcus pyogenes NOT DETECTED NOT DETECTED Final   A.calcoaceticus-baumannii NOT DETECTED NOT DETECTED Final   Bacteroides fragilis NOT DETECTED NOT DETECTED Final   Enterobacterales DETECTED (A) NOT DETECTED Final    Comment: Enterobacterales represent a large order of gram negative bacteria, not a single organism. CRITICAL RESULT CALLED TO, READ BACK BY AND VERIFIED WITH: Ulice Bold PharmD 9:20 03/06/20 ([email or web address])    Enterobacter cloacae complex DETECTED (A) NOT DETECTED Final    Comment: CRITICAL RESULT CALLED TO, READ BACK BY AND VERIFIED WITH: Ulice Bold PharmD 9:20 03/06/20 ([email or web address])    Escherichia coli NOT DETECTED NOT DETECTED Final   Klebsiella aerogenes NOT DETECTED NOT DETECTED Final   Klebsiella oxytoca NOT DETECTED NOT DETECTED Final   Klebsiella pneumoniae NOT DETECTED NOT DETECTED Final   Proteus species NOT DETECTED NOT DETECTED Final   Salmonella species NOT DETECTED NOT DETECTED Final   Serratia marcescens NOT DETECTED NOT DETECTED Final   Haemophilus influenzae NOT DETECTED NOT DETECTED Final   Neisseria meningitidis NOT DETECTED NOT  DETECTED Final   Pseudomonas aeruginosa NOT DETECTED NOT DETECTED Final   Stenotrophomonas maltophilia NOT DETECTED NOT DETECTED Final   Candida albicans NOT DETECTED NOT DETECTED Final   Candida auris NOT DETECTED NOT DETECTED Final   Candida glabrata NOT DETECTED NOT DETECTED Final   Candida krusei NOT DETECTED NOT DETECTED Final   Candida parapsilosis NOT DETECTED NOT DETECTED Final   Candida tropicalis NOT DETECTED NOT DETECTED Final   Cryptococcus neoformans/gattii NOT DETECTED NOT DETECTED Final   CTX-M ESBL NOT DETECTED NOT DETECTED Final   Carbapenem resistance IMP NOT DETECTED NOT DETECTED  Final   Carbapenem resistance KPC NOT DETECTED NOT DETECTED Final   Carbapenem resistance NDM NOT DETECTED NOT DETECTED Final   Carbapenem resist OXA 48 LIKE NOT DETECTED NOT DETECTED Final   Carbapenem resistance VIM NOT DETECTED NOT DETECTED Final    Comment: Performed at [facility], [place] [address], [place], [place] [address]  Culture, blood (routine x 2)     Status: None   Collection Time: 03/03/20  7:06 PM   Specimen: BLOOD RIGHT HAND  Result Value Ref Range Status   Specimen Description   Final    BLOOD RIGHT HAND Performed at [facility] [address], [place], [place] [address]    Special Requests   Final    BOTTLES DRAWN AEROBIC ONLY Blood Culture adequate volume Performed at [facility] [address], [place], [place] [address]    Culture   Final    NO GROWTH 5 DAYS Performed at [facility], [place] [address] [place], [place] [address]    Report Status 03/08/2020 FINAL  Final  Aerobic/Anaerobic Culture (surgical/deep wound)     Status: None   Collection Time: 03/04/20  3:38 PM   Specimen: Abscess  Result Value Ref Range Status   Specimen Description   Final    ABSCESS DRAIN LEFT LATERAL THIGH Performed at [facility] [address], [place], [place] [address]    Special Requests   Final     NONE Performed at [facility], [place] [address], [place], [place] [address]    Gram Stain   Final    ABUNDANT WBC PRESENT,BOTH PMN AND MONONUCLEAR ABUNDANT GRAM NEGATIVE RODS    Culture   Final    ABUNDANT ENTEROBACTER CLOACAE NO ANAEROBES ISOLATED Performed at [facility], [place] [address], [place], [place] [address]    Report Status 03/09/2020 FINAL  Final   Organism ID, Bacteria ENTEROBACTER CLOACAE  Final      Susceptibility   Enterobacter cloacae - MIC*    CEFAZOLIN >=64 RESISTANT Resistant     CEFEPIME <=0.12 SENSITIVE Sensitive     CEFTAZIDIME <=1 SENSITIVE Sensitive     CIPROFLOXACIN <=0.25 SENSITIVE Sensitive     GENTAMICIN <=1 SENSITIVE Sensitive     IMIPENEM 0.5 SENSITIVE Sensitive     TRIMETH/SULFA <=20 SENSITIVE Sensitive     PIP/TAZO <=4 SENSITIVE Sensitive     * ABUNDANT ENTEROBACTER CLOACAE  Culture, Urine     Status: Abnormal   Collection Time: 03/05/20  8:10 AM   Specimen: Urine, Random  Result Value Ref Range Status   Specimen Description   Final    URINE, RANDOM Performed at [facility], [place] [address] [place], [place] [address]    Special Requests   Final    NONE Performed at [facility], [place] [address], [place], [place] [address]    Culture >=100,000 COLONIES/mL ENTEROBACTER SPECIES (A)  Final   Report Status 03/07/2020 FINAL  Final   Organism ID, Bacteria ENTEROBACTER SPECIES (A)  Final      Susceptibility   Enterobacter species - MIC*    CEFAZOLIN >=64 RESISTANT Resistant     CEFEPIME <=0.12 SENSITIVE Sensitive     CEFTRIAXONE <=0.25 SENSITIVE Sensitive     CIPROFLOXACIN <=0.25 SENSITIVE Sensitive     GENTAMICIN <=1 SENSITIVE Sensitive     IMIPENEM 0.5 SENSITIVE Sensitive     NITROFURANTOIN 32 SENSITIVE Sensitive     TRIMETH/SULFA <=20  SENSITIVE Sensitive     PIP/TAZO <=4 SENSITIVE Sensitive     * >=100,000 COLONIES/mL ENTEROBACTER SPECIES    Studies/Results: DG CHEST PORT 1  VIEW  Result Date: 03/08/2020 CLINICAL DATA:  Preoperative assessment. EXAM: PORTABLE CHEST 1 VIEW COMPARISON:  Chest radiograph dated 01/26/2018. FINDINGS: The heart is enlarged. Both lungs are clear. Degenerative changes are seen in the spine. IMPRESSION: Cardiomegaly without acute cardiopulmonary process. Electronically Signed   By: Zerita Boers M.D.   On: 03/08/2020 17:34      Assessment/Plan:  INTERVAL HISTORY: going to OR today   Active Problems:   OA (osteoarthritis) of hip   Type 2 diabetes mellitus with hypoglycemia without coma (HCC)   Essential hypertension   Hyperlipidemia with target LDL less than 70   Hip pain   Effusion of hip joint, left   Abdominal distension   SBO (small bowel obstruction) ([place])   Bacteremia    [name] is a 74 y.o. male with  74 y.o.malewith PMHx of CAD, DM2, HTN, CKD, OA, s/p left hip arthoplasty (2001), prostate cancer, PAF, left ureteral stone s/p stenting (01/2020) who was sent from ortho clinic on 11/23 by Dr Wynelle Link. He is growing Enterobacter from blood, urine and hip aspirate. He is going for surgery today  #1 Enterobacter bacteremia: Check blood cultures to ensure clearing   #2 Prosthetic hip infection: to have single staged revision with I and D today  He will need 6 weeks of IV atnbiotics (cefepime given concern for AMP C with Enterobacter)  followed by oral antibiotics to get to 6 months if not longer  I will order PICC line tomorrow provided initial data from surveillance blood cultures are NG from today   #3 Kidney stones, ureteral stents. 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   I will put in OPAT orders for IV abx   LOS: 5 days   Alcide Evener 03/09/2020, 1:42 PM

## 2020-03-09 NOTE — Interval H&P Note (Signed)
History and Physical Interval Note:  03/09/2020 2:21 PM  [name]  has presented today for surgery, with the diagnosis of infected left hip.  The various methods of treatment have been discussed with the patient and family. After consideration of risks, benefits and other options for treatment, the patient has consented to  Procedure(s): INCISION AND DRAINAGE LEFT HIP WITH LINER EXCHANGE (Left) as a surgical intervention.  The patient's history has been reviewed, patient examined, no change in status, stable for surgery.  I have reviewed the patient's chart and labs.  Questions were answered to the patient's satisfaction.     Pilar Plate Tadeo Besecker

## 2020-03-09 NOTE — Care Management Important Message (Signed)
Important Message  Patient Details IM Letter given to the Patient. Name: [name] MRN: [id number] Date of Birth: Jun 10, 1945   Medicare Important Message Given:  Yes     Kerin Salen 03/09/2020, 9:46 AM

## 2020-03-09 NOTE — Brief Op Note (Signed)
03/03/2020 - 03/09/2020  4:43 PM  PATIENT:  [name]  74 y.o. male  PRE-OPERATIVE DIAGNOSIS:  infected left hip  POST-OPERATIVE DIAGNOSIS:  infected left hip  PROCEDURE:  Procedure(s): INCISION AND DRAINAGE LEFT HIP WITH LINER EXCHANGE (Left)  SURGEON:  Surgeon(s) and Role:    Gaynelle Arabian, MD - Primary  PHYSICIAN ASSISTANT:   ASSISTANTS: Molli Barrows, PA-C   ANESTHESIA:   general  EBL:  600 mL   DRAINS: (Medium) Hemovact drain(s) in the left hip with  Suction Open   LOCAL MEDICATIONS USED:  NONE  COUNTS:  YES  TOURNIQUET:  * No tourniquets in log *  DICTATION: .Other Dictation: Dictation Number [id number]  PLAN OF CARE: Admit to inpatient   PATIENT DISPOSITION:  PACU - hemodynamically stable.

## 2020-03-09 NOTE — Progress Notes (Addendum)
[place] for heparin Indication: atrial fibrillation  Allergies  Allergen Reactions  . Shellfish Allergy Rash    Patient Measurements: Height: 5\' 9"  (175.3 cm) Weight: 101.1 kg (222 lb 14.2 oz) IBW/kg (Calculated) : 70.7 Heparin Dosing Weight: 92 kg  Vital Signs: Temp: 98.2 F (36.8 C) (11/29 0400) Temp Source: Oral (11/29 0400) BP: 148/66 (11/29 0400) Pulse Rate: 75 (11/29 0400)  Labs: Recent Labs    03/07/20 0659 03/07/20 0659 03/08/20 0610 03/08/20 0610 03/08/20 0829 03/08/20 1753 03/09/20 0419  HGB 9.4*   < > 8.8*  --   --   --  9.0*  HCT 29.2*  --  28.4*  --   --   --  28.6*  PLT 573*  --  467*  --   --   --  500*  HEPARINUNFRC 0.43   < > <0.10*   < > 0.16* 0.25* 0.24*  CREATININE 2.55*  --  1.82*  --   --   --  1.44*   < > = values in this interval not displayed.    Estimated Creatinine Clearance: 52.8 mL/min (A) (by C-G formula based on SCr of 1.44 mg/dL (H)).   Assessment: Patient is a 74 y.o M with hx afib (not on anticoag. PTA) presented to the ED from [facility] for management of septic left hip with plan for I&D on 03/09/20.  He was found to be in afib. Pharmacy is consulted to start heparin for afib.  Significant Events: - 11/24: IR placement of drainage cath into hip abscess  Today, 03/09/2020: - Heparin level = 0.24 (subtherapeutic) with heparin gtt @ 1300 units/hr - Hgb low but stable - no complications of therapy noted  Goal of Therapy:  Heparin level 0.3-0.7 units/ml Monitor platelets by anticoagulation protocol: Yes   Plan:  - Increase heparin drip to 1500 units/hr  - check 8 hr heparin level - monitor for s/sx bleeding - please advise when heparin drip needs to held for OR procedure on 11/29  Everette Rank PharmD 03/09/2020,5:26 AM

## 2020-03-09 NOTE — Anesthesia Preprocedure Evaluation (Addendum)
Anesthesia Evaluation  Patient identified by MRN, date of birth, ID band Patient awake    Reviewed: Allergy & Precautions, NPO status , Patient's Chart, lab work & pertinent test results  Airway Mallampati: II  TM Distance: >3 FB Neck ROM: Full    Dental  (+) Edentulous Upper, Missing   Pulmonary asthma , former smoker,    Pulmonary exam normal breath sounds clear to auscultation       Cardiovascular hypertension, + CAD and + Past MI  + dysrhythmias Atrial Fibrillation  Rhythm:Irregular Rate:Normal  ECG: a-flutter, rate 113  ECHO: 1. Left ventricular ejection fraction, by estimation, is 55 to 60%. The left ventricle has normal function. The left ventricle has no regional wall motion abnormalities. There is mild concentric left ventricular hypertrophy. Left ventricular diastolic function could not be evaluated. 2. Right ventricular systolic function is normal. The right ventricular size is normal. 3. Left atrial size was mildly dilated. 4. The pericardial effusion is circumferential. 5. The mitral valve is normal in structure. Mild mitral valve regurgitation. No evidence of mitral stenosis. 6. The aortic valve is normal in structure. Aortic valve regurgitation is not visualized. Mild aortic valve sclerosis is present, with no evidence of aortic valve stenosis. 7. The inferior vena cava is normal in size with greater than 50% respiratory variability, suggesting right atrial pressure of 3 mmHg.   Neuro/Psych negative neurological ROS  negative psych ROS   GI/Hepatic Neg liver ROS, GERD  Medicated and Controlled,  Endo/Other  diabetes  Renal/GU CRFRenal disease     Musculoskeletal  (+) Arthritis ,   Abdominal (+) + obese,   Peds  Hematology  (+) anemia , HLD   Anesthesia Other Findings infected left hip  Reproductive/Obstetrics                            Anesthesia Physical Anesthesia  Plan  ASA: IV  Anesthesia Plan: General   Post-op Pain Management:    Induction: Intravenous  PONV Risk Score and Plan: 2 and Ondansetron, Dexamethasone and Treatment may vary due to age or medical condition  Airway Management Planned: Oral ETT  Additional Equipment:   Intra-op Plan:   Post-operative Plan: Extubation in OR  Informed Consent:     Dental advisory given  Plan Discussed with: CRNA  Anesthesia Plan Comments: (Diltiazem drip)       Anesthesia Quick Evaluation

## 2020-03-09 NOTE — Progress Notes (Signed)
Subjective:  Denies any chest pain or shortness of breath remains in atrial flutter with controlled ventricular response scheduled for left hip irrigation and debridement and liner exchange today Objective:  Vital Signs in the last 24 hours: Temp:  [97.9 F (36.6 C)-98.4 F (36.9 C)] 98.2 F (36.8 C) (11/29 0400) Pulse Rate:  [69-81] 75 (11/29 0400) Resp:  [14-21] 14 (11/29 0400) BP: (125-160)/(61-118) 148/66 (11/29 0400) SpO2:  [94 %-98 %] 96 % (11/29 0400) Weight:  [101.1 kg] 101.1 kg (11/29 0430)  Intake/Output from previous day: 11/28 0701 - 11/29 0700 In: 1914.2 [P.O.:360; I.V.:1454.2; IV Piggyback:100] Out: 2700 [Urine:2050; Emesis/NG output:600; Drains:50] Intake/Output from this shift: Total I/O In: 25 [Other:25] Out: 400 [Urine:400]  Physical Exam: Neck: no adenopathy, no carotid bruit, no JVD and supple, symmetrical, trachea midline Lungs: clear to auscultation bilaterally Heart: regularly irregular rhythm, S1, S2 normal and soft systolic murmur noted Abdomen: soft, non-tender; bowel sounds normal; no masses,  no organomegaly Extremities: extremities normal, atraumatic, no cyanosis or edema  Lab Results: Recent Labs    03/08/20 0610 03/09/20 0419  WBC 7.3 7.6  HGB 8.8* 9.0*  PLT 467* 500*   Recent Labs    03/08/20 0610 03/09/20 0419  NA 140 138  K 4.1 4.4  CL 101 101  CO2 26 25  GLUCOSE 158* 148*  BUN 87* 57*  CREATININE 1.82* 1.44*   No results for input(s): TROPONINI in the last 72 hours.  Invalid input(s): CK, MB Hepatic Function Panel Recent Labs    03/09/20 0419  PROT 7.1  ALBUMIN 2.3*  AST 44*  ALT 33  ALKPHOS 94  BILITOT 0.6   No results for input(s): CHOL in the last 72 hours. No results for input(s): PROTIME in the last 72 hours.  Imaging: Imaging results have been reviewed and DG CHEST PORT 1 VIEW  Result Date: 03/08/2020 CLINICAL DATA:  Preoperative assessment. EXAM: PORTABLE CHEST 1 VIEW COMPARISON:  Chest radiograph dated  01/26/2018. FINDINGS: The heart is enlarged. Both lungs are clear. Degenerative changes are seen in the spine. IMPRESSION: Cardiomegaly without acute cardiopulmonary process. Electronically Signed   By: Zerita Boers M.D.   On: 03/08/2020 17:34    Cardiac Studies:  Assessment/Plan:  Atrial flutter with controlled ventricular response chads Vasc score of 3 on chronic anticoagulation now Coronary artery disease history of MI in remote past stable Left hip septic arthritis Hypertension Diabetes mellitus Hyperlipidemia Degenerative joint disease History of carcinoma of the prostate History of nephrolithiasis status post bilateral ureteric stents Chronic kidney disease stage IV improved Anemia of chronic disease Plan Continue present management. Switch Cardizem IV to by mouth tomorrow.   LOS: 5 days    [name] 03/09/2020, 1:05 PM

## 2020-03-09 NOTE — Progress Notes (Signed)
PHARMACY CONSULT NOTE FOR:  OUTPATIENT  PARENTERAL ANTIBIOTIC THERAPY (OPAT)  Indication: Prosthetic joint infection of L hip Regimen: Cefepime 2g IV q12h End date: 04/20/2020  IV antibiotic discharge orders are pended. To discharging provider:  please sign these orders via discharge navigator,  Select New Orders & click on the button choice - Manage This Unsigned Work.     Thank you for allowing pharmacy to be a part of this patient's care.  [name] 03/09/2020, 1:58 PM

## 2020-03-09 NOTE — Progress Notes (Signed)
Pharmacy Consult Note - IV heparin for Afib  A/P: IV heparin was turned off at 0800 this AM for I&D of left hip with liner exchange. Spoke with ortho PA this evening, Leann Haus, and plan is to restart IV heparin tomorrow 11/30 at 0500 and then triad/cards to decide on long term plan for anticoagulation for Afib. Will restart IV heparin at previous rate 1500 units/hr as the rate was increased to that at 0535 this AM before the heparin was stopped at 0800 so we don't have a heparin level from that new rate yet. Will check a heparin level 8 hours after start of IV heparin at 0500.   Adrian Saran, PharmD, BCPS 03/09/2020 7:35 PM

## 2020-03-09 NOTE — Progress Notes (Signed)
Subjective: Mr Snavely feels a little better today. Still with hip pain   Objective: Vital signs in last 24 hours: Temp:  [97.9 F (36.6 C)-98.4 F (36.9 C)] 98.2 F (36.8 C) (11/29 0400) Pulse Rate:  [69-81] 75 (11/29 0400) Resp:  [14-21] 14 (11/29 0400) BP: (125-160)/(61-118) 148/66 (11/29 0400) SpO2:  [94 %-98 %] 96 % (11/29 0400) Weight:  [101.1 kg] 101.1 kg (11/29 0430)  Intake/Output from previous day: 11/28 0701 - 11/29 0700 In: 1914.2 [P.O.:360; I.V.:1454.2; IV Piggyback:100] Out: 2700 [Urine:2050; Emesis/NG output:600; Drains:50] Intake/Output this shift: No intake/output data recorded.  Recent Labs    03/07/20 0659 03/08/20 0610 03/09/20 0419  HGB 9.4* 8.8* 9.0*   Recent Labs    03/08/20 0610 03/09/20 0419  WBC 7.3 7.6  RBC 2.95* 3.02*  HCT 28.4* 28.6*  PLT 467* 500*   Recent Labs    03/08/20 0610 03/09/20 0419  NA 140 138  K 4.1 4.4  CL 101 101  CO2 26 25  BUN 87* 57*  CREATININE 1.82* 1.44*  GLUCOSE 158* 148*  CALCIUM 8.2* 8.3*   No results for input(s): LABPT, INR in the last 72 hours.  Neurovascular intact No cellulitis present Compartment soft    Assessment/Plan: Left hip septic arthritis- Plan irrigation and debridement and liner exchange today. Discussed with patient who elects to proceed. Stop heparin at 8 AM   Ally Knodel 03/09/2020, 7:13 AM

## 2020-03-09 NOTE — H&P (View-Only) (Signed)
Subjective: [name] feels a little better today. Still with hip pain   Objective: Vital signs in last 24 hours: Temp:  [97.9 F (36.6 C)-98.4 F (36.9 C)] 98.2 F (36.8 C) (11/29 0400) Pulse Rate:  [69-81] 75 (11/29 0400) Resp:  [14-21] 14 (11/29 0400) BP: (125-160)/(61-118) 148/66 (11/29 0400) SpO2:  [94 %-98 %] 96 % (11/29 0400) Weight:  [101.1 kg] 101.1 kg (11/29 0430)  Intake/Output from previous day: 11/28 0701 - 11/29 0700 In: 1914.2 [P.O.:360; I.V.:1454.2; IV Piggyback:100] Out: 2700 [Urine:2050; Emesis/NG output:600; Drains:50] Intake/Output this shift: No intake/output data recorded.  Recent Labs    03/07/20 0659 03/08/20 0610 03/09/20 0419  HGB 9.4* 8.8* 9.0*   Recent Labs    03/08/20 0610 03/09/20 0419  WBC 7.3 7.6  RBC 2.95* 3.02*  HCT 28.4* 28.6*  PLT 467* 500*   Recent Labs    03/08/20 0610 03/09/20 0419  NA 140 138  K 4.1 4.4  CL 101 101  CO2 26 25  BUN 87* 57*  CREATININE 1.82* 1.44*  GLUCOSE 158* 148*  CALCIUM 8.2* 8.3*   No results for input(s): LABPT, INR in the last 72 hours.  Neurovascular intact No cellulitis present Compartment soft    Assessment/Plan: Left hip septic arthritis- Plan irrigation and debridement and liner exchange today. Discussed with patient who elects to proceed. Stop heparin at 8 AM   [name] 03/09/2020, 7:13 AM

## 2020-03-09 NOTE — Transfer of Care (Signed)
Immediate Anesthesia Transfer of Care Note  Patient: [name]  Procedure(s) Performed: INCISION AND DRAINAGE LEFT HIP WITH LINER EXCHANGE (Left )  Patient Location: PACU  Anesthesia Type:General  Level of Consciousness: awake, alert , oriented and patient cooperative  Airway & Oxygen Therapy: Patient Spontanous Breathing and Patient connected to face mask oxygen  Post-op Assessment: Report given to RN, Post -op Vital signs reviewed and stable and Patient moving all extremities X 4  Post vital signs: stable  Last Vitals:  Vitals Value Taken Time  BP 186/86 03/09/20 1730  Temp    Pulse 70 03/09/20 1739  Resp 14 03/09/20 1742  SpO2 97 % 03/09/20 1739  Vitals shown include unvalidated device data.  Last Pain:  Vitals:   03/09/20 1308  TempSrc: Oral  PainSc:       Patients Stated Pain Goal: 5 (95/18/84 1660)  Complications: No complications documented.

## 2020-03-09 NOTE — Op Note (Signed)
NAME: [name], [name] MEDICAL RECORD [id number] ACCOUNT [id number] DATE OF BIRTH:[birth year] FACILITY: [facility] LOCATION: [facility] PHYSICIAN:Ilyanna Baillargeon Zella Ball, MD  OPERATIVE REPORT  DATE OF PROCEDURE:  03/09/2020  PREOPERATIVE DIAGNOSIS:  Periprosthetic joint infection, left hip.  POSTOPERATIVE DIAGNOSIS:  Periprosthetic joint infection, left hip.  PROCEDURE:  Irrigation and debridement, left hip, with bearing surface exchange.  SURGEON:  Gaynelle Arabian, MD  ASSISTANT:  Molli Barrows, PA-C  ANESTHESIA:  General.  ESTIMATED BLOOD LOSS:  600.  DRAIN:  Hemovac x1.  COMPLICATIONS:  None.  CONDITION:  Stable to recovery.  BRIEF CLINICAL NOTE:  The patient is a 74 year old male who presented approximately 6 days ago with significant left hip pain and questionable septic hip.  He had unstable medical problems including ileus and rapid atrial fibrillation as well as  urosepsis.  He has stabilized better clinically to the point where he can now have the irrigation and debridement of his left hip.  He had an aspiration done 5 days ago showing gram-negative rods consistent with the bacteria he had in his blood and in  his urine.  He presents today for irrigation, debridement and bearing surface exchange.  It was felt that a resection arthroplasty would not be tolerated from a medical standpoint.  PROCEDURE IN DETAIL:  After successful administration of general anesthetic, the patient was placed in the right lateral decubitus position with the left side up and held with a hip positioner.  Left lower extremity was isolated from his perineum with  plastic drapes and prepped and draped in the usual sterile fashion.  Previous posterolateral incision was utilized.  Skin cut with a 10 blade through subcutaneous tissue to the level of the fascia lata, which was incised in line with the skin incision.   A tremendous amount of purulent fluid was identified.  This was sent for stat Gram stain, culture and  sensitivity and aerobic and anaerobic cultures.  We evacuated all of the purulent fluid from the joint.  He had some necrotic tissue which was also  debrided.  The wound was then copiously irrigated with 3 liters of saline with pulsatile lavage.  The tissue had a much healthier appearance.  We then resected enough tissue around the capsule of the joint and resected the capsule to get into the joint.   We thoroughly irrigated this area also, then dislocated the hip.  He had a metal-on-metal hip but had no signs of metallosis.  The femoral head was removed and then the femur was retracted anteriorly to gain acetabular exposure.  The acetabular liner  was then removed from the acetabular shell.  The shell was in excellent position and is well fixed.  There was no damage to the shell.  We placed IrriSept into the wound and left there for 5 minutes.  We then irrigated again,  thoroughly cleansing the  acetabular shell and then placed a new 36 mm neutral +4 Marathon liner for a 58 cup.  He had a 40+3 head, so we placed a 36+3 metal head, reduced the hip with excellent stability.  At this point, it appeared that all of the abnormal tissue had already  been debrided back to normal-appearing tissue.  We irrigated with another 3 liters of saline with pulsatile lavage and finished off the last 500 mL of the IrriSept.  I was very satisfied with the appearance of the wound after the debridement.  We then  closed the fascia lata over Hemovac drain with running #1 Stratafix suture.  The drain  was sewn in with a nylon.  The subcu was then closed with interrupted 2-0 Vicryl and subcuticular running 4-0 Monocryl.  Incisions cleaned and dried and Steri-Strips  and sterile dressing applied.  He was awakened and transported to recovery in stable condition.  HN/NUANCE  D:03/09/2020 T:03/09/2020 JOB:[id number]

## 2020-03-10 ENCOUNTER — Inpatient Hospital Stay (HOSPITAL_COMMUNITY): Payer: HMO

## 2020-03-10 ENCOUNTER — Encounter (HOSPITAL_COMMUNITY): Payer: Self-pay | Admitting: Orthopedic Surgery

## 2020-03-10 DIAGNOSIS — N3000 Acute cystitis without hematuria: Secondary | ICD-10-CM | POA: Diagnosis not present

## 2020-03-10 DIAGNOSIS — M25452 Effusion, left hip: Secondary | ICD-10-CM | POA: Diagnosis not present

## 2020-03-10 DIAGNOSIS — R7881 Bacteremia: Secondary | ICD-10-CM | POA: Diagnosis not present

## 2020-03-10 DIAGNOSIS — E11649 Type 2 diabetes mellitus with hypoglycemia without coma: Secondary | ICD-10-CM | POA: Diagnosis not present

## 2020-03-10 HISTORY — PX: IR US GUIDE VASC ACCESS RIGHT: IMG2390

## 2020-03-10 HISTORY — PX: IR FLUORO GUIDE CV LINE RIGHT: IMG2283

## 2020-03-10 LAB — TYPE AND SCREEN
ABO/RH(D): A POS
Antibody Screen: NEGATIVE
Unit division: 0
Unit division: 0

## 2020-03-10 LAB — BASIC METABOLIC PANEL
Anion gap: 12 (ref 5–15)
BUN: 51 mg/dL — ABNORMAL HIGH (ref 8–23)
CO2: 22 mmol/L (ref 22–32)
Calcium: 8.1 mg/dL — ABNORMAL LOW (ref 8.9–10.3)
Chloride: 104 mmol/L (ref 98–111)
Creatinine, Ser: 1.69 mg/dL — ABNORMAL HIGH (ref 0.61–1.24)
GFR, Estimated: 42 mL/min — ABNORMAL LOW (ref >60)
Glucose, Bld: 194 mg/dL — ABNORMAL HIGH (ref 70–99)
Potassium: 5.1 mmol/L (ref 3.5–5.1)
Sodium: 138 mmol/L (ref 135–145)

## 2020-03-10 LAB — MISC LABCORP TEST (SEND OUT): Labcorp test code: 71506

## 2020-03-10 LAB — BPAM RBC
Blood Product Expiration Date: 202112142359
Blood Product Expiration Date: 202112152359
ISSUE DATE / TIME: 202111291606
ISSUE DATE / TIME: 202111291606
Unit Type and Rh: 6200
Unit Type and Rh: 6200

## 2020-03-10 LAB — CBC
HCT: 32.8 % — ABNORMAL LOW (ref 39.0–52.0)
Hemoglobin: 10.7 g/dL — ABNORMAL LOW (ref 13.0–17.0)
MCH: 30.1 pg (ref 26.0–34.0)
MCHC: 32.6 g/dL (ref 30.0–36.0)
MCV: 92.1 fL (ref 80.0–100.0)
Platelets: 449 10*3/uL — ABNORMAL HIGH (ref 150–400)
RBC: 3.56 MIL/uL — ABNORMAL LOW (ref 4.22–5.81)
RDW: 16.4 % — ABNORMAL HIGH (ref 11.5–15.5)
WBC: 13.3 10*3/uL — ABNORMAL HIGH (ref 4.0–10.5)
nRBC: 0 % (ref 0.0–0.2)

## 2020-03-10 LAB — HEPATIC FUNCTION PANEL
ALT: 67 U/L — ABNORMAL HIGH (ref 0–44)
AST: 76 U/L — ABNORMAL HIGH (ref 15–41)
Albumin: 2.4 g/dL — ABNORMAL LOW (ref 3.5–5.0)
Alkaline Phosphatase: 97 U/L (ref 38–126)
Bilirubin, Direct: 0.2 mg/dL (ref 0.0–0.2)
Indirect Bilirubin: 0.7 mg/dL (ref 0.3–0.9)
Total Bilirubin: 0.9 mg/dL (ref 0.3–1.2)
Total Protein: 6.9 g/dL (ref 6.5–8.1)

## 2020-03-10 LAB — GLUCOSE, CAPILLARY
Glucose-Capillary: 191 mg/dL — ABNORMAL HIGH (ref 70–99)
Glucose-Capillary: 209 mg/dL — ABNORMAL HIGH (ref 70–99)
Glucose-Capillary: 196 mg/dL — ABNORMAL HIGH (ref 70–99)
Glucose-Capillary: 197 mg/dL — ABNORMAL HIGH (ref 70–99)

## 2020-03-10 LAB — HEPARIN LEVEL (UNFRACTIONATED)
Heparin Unfractionated: 0.42 IU/mL (ref 0.30–0.70)
Heparin Unfractionated: 0.35 IU/mL (ref 0.30–0.70)

## 2020-03-10 MED ORDER — ONDANSETRON HCL 4 MG PO TABS: 4.0000 mg | ORAL_TABLET | Freq: Four times a day (QID) | ORAL | Status: DC | PRN

## 2020-03-10 MED ORDER — METOCLOPRAMIDE HCL 5 MG PO TABS: 5.0000 mg | ORAL_TABLET | Freq: Three times a day (TID) | ORAL | Status: DC | PRN

## 2020-03-10 MED ORDER — OXYCODONE HCL 5 MG PO TABS
10.0000 mg | ORAL_TABLET | ORAL | Status: DC | PRN
  Administered 2020-03-10 – 2020-03-13 (×9): 10 mg via ORAL
  Filled 2020-03-10 (×9): qty 2

## 2020-03-10 MED ORDER — METOCLOPRAMIDE HCL 5 MG/ML IJ SOLN: 5.0000 mg | Freq: Three times a day (TID) | INTRAMUSCULAR | Status: DC | PRN

## 2020-03-10 MED ORDER — LIDOCAINE HCL 1 % IJ SOLN
INTRAMUSCULAR | Status: AC | PRN
  Administered 2020-03-10: 10 mL via INTRADERMAL

## 2020-03-10 MED ORDER — ONDANSETRON HCL 4 MG/2ML IJ SOLN: 4.0000 mg | Freq: Four times a day (QID) | INTRAMUSCULAR | Status: DC | PRN

## 2020-03-10 MED ORDER — LIDOCAINE HCL 1 % IJ SOLN
INTRAMUSCULAR | Status: AC
  Filled 2020-03-10: qty 20

## 2020-03-10 MED ORDER — ACETAMINOPHEN 325 MG PO TABS: 325.0000 mg | ORAL_TABLET | Freq: Four times a day (QID) | ORAL | Status: DC | PRN

## 2020-03-10 MED ORDER — METHOCARBAMOL 500 MG PO TABS
500.0000 mg | ORAL_TABLET | Freq: Four times a day (QID) | ORAL | Status: DC | PRN
  Administered 2020-03-12: 500 mg via ORAL
  Filled 2020-03-10: qty 1

## 2020-03-10 MED ORDER — OXYCODONE HCL 5 MG PO TABS
5.0000 mg | ORAL_TABLET | ORAL | Status: DC | PRN
  Administered 2020-03-12: 5 mg via ORAL
  Filled 2020-03-10: qty 1

## 2020-03-10 MED ORDER — AMIODARONE HCL 200 MG PO TABS
200.0000 mg | ORAL_TABLET | Freq: Every day | ORAL | Status: DC
  Administered 2020-03-10 – 2020-03-20 (×11): 200 mg via ORAL
  Filled 2020-03-10 (×11): qty 1

## 2020-03-10 MED ORDER — METHOCARBAMOL 1000 MG/10ML IJ SOLN
500.0000 mg | Freq: Four times a day (QID) | INTRAVENOUS | Status: DC | PRN
  Filled 2020-03-10: qty 5

## 2020-03-10 MED ORDER — LACTATED RINGERS IV SOLN: INTRAVENOUS | Status: DC

## 2020-03-10 NOTE — Consult Note (Signed)
Chief Complaint: Left hip septic arthritis/ prosthetic joint infection. Request is for tunneled central line placement  Referring Physician(s): Dr. Tommy Medal  Supervising Physician: Corrie Mckusick  Patient Status: [facility] - In-pt  History of Present Illness: [name] is a 74 y.o. male History of OA, anemia, Asthma, CAD with prior MI, GERD, HTN, CKD, prostate cancer, diabetes. Total hp arthroplasty in 2013. Admitted to [facility] for hip pain found to have left hip septic arthritis/ prosthetic joint infection. IR placed a left thigh drain on 11.24.21. Drain was removed by surgery during I&D with left hip liner exchange on 11.29.21. Team is requesting a tunneled central line for ongoing antibiotics administration.   Currently without any significant complaints. Patient states he was just given some pain medication. Return precautions and treatment recommendations and follow-up discussed with the patient who is agreeable with the plan.     Past Medical History:  Diagnosis Date  . Arthritis   . Asthma    no inhaler  . Coronary artery disease    cardiologist-  dr spruill; last visit 3 mos ago per pt  . GERD (gastroesophageal reflux disease)   . History of MI (myocardial infarction)    1985  . Hydronephrosis, left   . Hypertension   . Myocardial infarction ([place])   . Nephrolithiasis    left  . 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 Presence of tooth-root and mandibular implants    lower dental implants  . Prostate cancer ([place])   . Shortness of breath    WITH EXERTION  . Type 2 diabetes mellitus ([place])     Past Surgical History:  Procedure Laterality Date  . BUNIONECTOMY    . CYSTOSCOPY W/ RETROGRADES Left 03/14/2014   Procedure: CYSTOSCOPY WITH LEFT RETROGRADE PYELOGRAM, Left Ureteroscopy, [place] ureteral Stent No string;  Surgeon: [name], MD;  Location: [facility];  Service: Urology;  Laterality: Left;  . CYSTOSCOPY W/ URETERAL STENT  PLACEMENT Bilateral 01/15/2020   Procedure: CYSTOSCOPY WITH RETROGRADE PYELOGRAM/URETERAL STENT PLACEMENT;  Surgeon: Alexis Frock, MD;  Location: [facility];  Service: Urology;  Laterality: Bilateral;  . HERNIA REPAIR    . INCISION AND DRAINAGE Left 03/09/2020   Procedure: INCISION AND DRAINAGE LEFT HIP WITH LINER EXCHANGE;  Surgeon: Gaynelle Arabian, MD;  Location: [facility];  Service: Orthopedics;  Laterality: Left;  . PROSTATE BIOPSY    . TOTAL HIP ARTHROPLASTY Left 03-20-2009  . TOTAL HIP ARTHROPLASTY  04/09/2012   Procedure: TOTAL HIP ARTHROPLASTY;  Surgeon: [name], MD;  Location: [facility];  Service: Orthopedics;  Laterality: Right;  . TOTAL KNEE ARTHROPLASTY Left 05-16-2008    Allergies: Shellfish allergy  Medications: Prior to Admission medications   Medication Sig Start Date End Date Taking? Authorizing Provider  albuterol (VENTOLIN HFA) 108 (90 Base) MCG/ACT inhaler Inhale 2 puffs into the lungs every 6 (six) hours as needed for wheezing or shortness of breath. 08/09/19 03/03/20 Yes Luetta Nutting, DO  amLODipine (NORVASC) 10 MG tablet Take 10 mg by mouth daily.   Yes [provider]  Aspirin 81 MG CAPS Take 81 mg by mouth daily.    Yes [provider]  atorvastatin (LIPITOR) 40 MG tablet Take 1 tablet (40 mg total) by mouth daily. 06/26/16  Yes Tat, Shanon Brow, MD  carvedilol (COREG) 25 MG tablet Take 25 mg by mouth 2 (two) times daily with a meal.   Yes [provider]  co-enzyme Q-10 30 MG capsule  Take 30 mg by mouth daily.   Yes [provider]  cycloSPORINE (RESTASIS) 0.05 % ophthalmic emulsion Place 1 drop into both eyes 2 (two) times daily.  09/23/19  Yes [provider]  diclofenac Sodium (VOLTAREN) 1 % GEL Apply 4 g topically 4 (four) times daily. 03/01/20  Yes Deno Etienne, DO  docusate sodium (COLACE) 100 MG capsule Take 100 mg by mouth daily as needed for mild constipation.   Yes [provider]  ferrous sulfate 325 (65 FE)  MG tablet Take 325 mg by mouth daily with breakfast.   Yes [provider]  gabapentin (NEURONTIN) 300 MG capsule Take 1 capsule (300 mg total) by mouth 3 (three) times daily. Start 300mg  at bedtime x1 week then may increase to three times per day. Patient taking differently: Take 300 mg by mouth in the morning, at noon, and at bedtime.  10/21/19  Yes Luetta Nutting, DO  glimepiride (AMARYL) 4 MG tablet Take 4 mg by mouth daily before breakfast.   Yes [provider]  hydrochlorothiazide (HYDRODIURIL) 25 MG tablet Take 25 mg by mouth daily. 02/28/20  Yes [provider]  morphine (MSIR) 15 MG tablet Take 0.5 tablets (7.5 mg total) by mouth every 4 (four) hours as needed for severe pain. 03/01/20  Yes Deno Etienne, DO  predniSONE (DELTASONE) 50 MG tablet Take daily until resolution of gout symptoms. Patient taking differently: Take 50 mg by mouth daily. Take daily until resolution of gout symptoms. 02/21/20  Yes Luetta Nutting, DO  sitaGLIPtin (JANUVIA) 50 MG tablet Take 1 tablet (50 mg total) by mouth daily. 06/26/16  Yes Tat, Shanon Brow, MD  spironolactone (ALDACTONE) 25 MG tablet Take 25 mg by mouth daily. 02/28/20  Yes [provider]  Continuous Blood Gluc Receiver (FREESTYLE LIBRE 14 DAY READER) [place] See admin instructions. 05/23/19   [provider]  Continuous Blood Gluc Sensor (FREESTYLE LIBRE 14 DAY SENSOR) MISC 1 Device by Does not apply route every 14 (fourteen) days. 05/23/19   Luetta Nutting, DO  omeprazole (PRILOSEC) 40 MG capsule Take 1 capsule (40 mg total) by mouth 2 (two) times daily. X 1 month, then once daily Patient not taking: Reported on 03/03/2020 06/26/16   Orson Eva, MD     Family History  Problem Relation Age of Onset  . Cancer Mother        breast  . Multiple sclerosis Daughter   . Cancer Father        prostate  . Cancer Maternal Uncle        bone    Social History   Socioeconomic History  . Marital status: Widowed    Spouse  name: Not on file  . Number of children: Not on file  . Years of education: Not on file  . Highest education level: Not on file  Occupational History  . Not on file  Tobacco Use  . Smoking status: Former Smoker    Packs/day: 1.00    Years: 25.00    Pack years: 25.00    Types: Cigarettes    Quit date: 04/11/1984    Years since quitting: 35.9  . Smokeless tobacco: Former Systems developer    Quit date: 04/02/1985  Vaping Use  . Vaping Use: Never used  Substance and Sexual Activity  . Alcohol use: Yes    Comment: RARE  . Drug use: No  . Sexual activity: Not Currently  Other Topics Concern  . Not on file  Social History Narrative  . Not  on file   Social Determinants of Health   Financial Resource Strain:   . Difficulty of Paying Living Expenses: Not on file  Food Insecurity: No Food Insecurity  . Worried About Charity fundraiser in the Last Year: Never true  . Ran Out of Food in the Last Year: Never true  Transportation Needs: No Transportation Needs  . Lack of Transportation (Medical): No  . Lack of Transportation (Non-Medical): No  Physical Activity:   . Days of Exercise per Week: Not on file  . Minutes of Exercise per Session: Not on file  Stress:   . Feeling of Stress : Not on file  Social Connections:   . Frequency of Communication with Friends and Family: Not on file  . Frequency of Social Gatherings with Friends and Family: Not on file  . Attends Religious Services: Not on file  . Active Member of Clubs or Organizations: Not on file  . Attends Archivist Meetings: Not on file  . Marital Status: Not on file    Review of Systems: A 12 point ROS discussed and pertinent positives are indicated in the HPI above.  All other systems are negative.  Review of Systems  Constitutional: Negative for fever.  HENT: Negative for congestion.   Respiratory: Negative for cough and shortness of breath.   Cardiovascular: Negative for chest pain.  Gastrointestinal: Negative for  abdominal pain.  Neurological: Negative for headaches.  Psychiatric/Behavioral: Negative for behavioral problems and confusion.    Vital Signs: BP (!) 157/81 (BP Location: Right Arm)   Pulse 95   Temp 98.9 F (37.2 C) (Oral)   Resp 15   Ht 5\' 9"  (1.753 m)   Wt 222 lb 14.2 oz (101.1 kg)   SpO2 99%   BMI 32.91 kg/m   Physical Exam Vitals and nursing note reviewed.  Constitutional:      Appearance: He is well-developed.  HENT:     Head: Normocephalic.  Cardiovascular:     Rate and Rhythm: Normal rate and regular rhythm.     Heart sounds: Normal heart sounds.  Pulmonary:     Effort: Pulmonary effort is normal.     Breath sounds: Normal breath sounds.  Musculoskeletal:     Cervical back: Normal range of motion.     Comments: Wound vac and surgical drain noted to left thigh  Skin:    General: Skin is dry.  Neurological:     Mental Status: He is alert and oriented to person, place, and time.     Imaging: CT ABDOMEN PELVIS WO CONTRAST  Result Date: 03/04/2020 CLINICAL DATA:  Nausea vomiting. EXAM: CT ABDOMEN AND PELVIS WITHOUT CONTRAST TECHNIQUE: Multidetector CT imaging of the abdomen and pelvis was performed following the standard protocol without IV contrast. COMPARISON:  CT abdomen pelvis 01/14/2020 FINDINGS: Lines and tubes: Enteric tube noted with tip within the gastric lumen and side port at the gastroesophageal junction. Bilateral ureteral stents with proximal pigtails in a superior pole calyx and distal pigtails not visualized due to streak artifact originating from bilateral femoral surgical hardware. Distal pigtails of the ureteral stents terminate in the expected region of the urinary bladder. Lower chest: Coronary artery calcifications. Trace pericardial effusion. No acute abnormality. Hepatobiliary: No focal liver abnormality. No gallstones, gallbladder wall thickening, or pericholecystic fluid. No biliary dilatation. Pancreas: No focal lesion. Normal pancreatic  contour. No surrounding inflammatory changes. No main pancreatic ductal dilatation. Spleen: Normal in size. There is a 1.6 cm fluid density lesion within the  spleen likely represents a simple cyst. Otherwise no focal abnormality. Adrenals/Urinary Tract: No adrenal nodule bilaterally. Couple of tiny foci of gas noted within the collecting systems bilaterally. No nephrolithiasis, no hydronephrosis, and no contour-deforming renal mass. No ureterolithiasis or hydroureter. The urinary bladder is unremarkable. Stomach/Bowel: Stomach is within normal limits. The proximal mid small bowel is mildly distended with fluid of to 3.2 cm in caliber. No associated pneumatosis. There is suggestion of a possible transition point within the anterior abdomen at the level of the umbilicus. The distal small bowel is collapsed. No evidence of large bowel wall thickening or dilatation. Appendix appears normal. Vascular/Lymphatic: No abdominal aorta or iliac aneurysm. Severe atherosclerotic plaque of the aorta and its branches. No abdominal, pelvic, or inguinal lymphadenopathy. Reproductive: Not visualized due to streak artifact originating from bilateral its femoral surgical hardware. Other: No intraperitoneal free fluid. No intraperitoneal free gas. No organized fluid collection. Musculoskeletal: Tiny Richter hernia containing the anti mesenteric wall of a very short segment of small bowel (where the possible transition point is identified). No suspicious lytic or blastic osseous lesions. No acute displaced fracture. Multilevel degenerative changes of the spine. Partially visualized bilateral total hip arthroplasties. IMPRESSION: 1. Tiny Richter hernia containing a very short loop of small bowel with an associated early/partial small bowel obstruction. 2. Couple of tiny foci of gas noted within the collecting systems bilaterally. Findings could be related to recent ureteral stent placement versus could represent emphysematous pyelitis.  Recommend correlation with urinalysis for urinary tract infection. 3. Enteric tube with tip within the gastric lumen and side port at the gastroesophageal junction. Consider advancing by 3-5cm. 4.  Aortic Atherosclerosis (ICD10-I70.0). Electronically Signed   By: Iven Finn M.D.   On: 03/04/2020 21:09   DG Abd 1 View  Result Date: 03/04/2020 CLINICAL DATA:  NG tube placement EXAM: ABDOMEN - 1 VIEW COMPARISON:  03/04/2020 FINDINGS: Esophageal tube tip projects over the mid gastric region. Dilated loops of small bowel suspicious for obstruction. Partially visualized ureteral stents IMPRESSION: Esophageal tube tip overlies the mid stomach. Electronically Signed   By: Donavan Foil M.D.   On: 03/04/2020 22:46   DG Abd 1 View  Result Date: 03/04/2020 CLINICAL DATA:  NG tube placement EXAM: ABDOMEN - 1 VIEW COMPARISON:  03/03/2020 FINDINGS: An enteric tube is present with tip in the left upper quadrant consistent with location in the upper stomach although the proximal side hole projects over the distal esophagus. Mild gaseous distention of the stomach. Gaseous distention of small bowel is demonstrated in the upper abdomen. IMPRESSION: Enteric tube tip projects over the upper stomach with proximal side hole over the distal esophagus. Electronically Signed   By: Lucienne Capers M.D.   On: 03/04/2020 02:02   DG Abd 1 View  Result Date: 03/03/2020 CLINICAL DATA:  Abdominal distension EXAM: ABDOMEN - 1 VIEW COMPARISON:  02/24/2020 FINDINGS: Diffusely gas distended bowel, including a loop of dilated small bowel in the left mid abdomen. Bilateral nephroureteral stents. IMPRESSION: Diffusely gas distended bowel, including a loop of dilated small bowel in the left mid abdomen. Electronically Signed   By: Ulyses Jarred M.D.   On: 03/03/2020 23:21   CT HIP LEFT WO CONTRAST  Result Date: 03/01/2020 CLINICAL DATA:  Left hip pain. EXAM: CT OF THE LEFT HIP WITHOUT CONTRAST TECHNIQUE: Multidetector CT imaging  of the left hip was performed according to the standard protocol. Multiplanar CT image reconstructions were also generated. COMPARISON:  X-ray 03/01/2020 FINDINGS: Bones/Joint/Cartilage Status post left total  hip arthroplasty. Arthroplasty components are in their expected alignment. No evidence of periprosthetic fracture. Large fluid and air containing collection surrounding the proximal femoral component measuring approximately 8.7 cm in AP dimension (series 4, image 61) by 6.7 cm in craniocaudal dimension (series 9, image 94). Largest fluid component is along the lateral aspect of the proximal femur. Air and fluid track medially within the adductor muscle compartment extending to the level of the left pubic tubercle (series 4, images 74-79). Possible erosion along the anterior aspect of the proximal native femur with air seen within bone (series 3, image 68). Periprosthetic lucency along the posterior cortex of the proximal femoral stem extending distally by approximately 7.5 cm (series 8, image 59). Ligaments Suboptimally assessed by CT. Muscles and Tendons Poorly defined fluid and air collection within the left adductor muscular compartment. Soft tissues Remaining soft tissues within normal limits. IMPRESSION: 1. Status post left total hip arthroplasty. Large fluid and air containing collection surrounding the proximal femoral component measuring up to 8.7 cm, concerning for infection/abscess. Fluid and air collection track within the left adductor muscle compartment extending towards the symphysis. Orthopedic consultation and arthrocentesis recommended. 2. Periprosthetic lucency along the posterior cortex of the proximal femoral stem extending distally by approximately 7.5 cm, concerning for septic loosening. 3. Additional area of lucency along the anterior aspect of the proximal native femur with air seen within bone, concerning for osteomyelitis. Electronically Signed   By: Davina Poke D.O.   On:  03/01/2020 11:10   DG CHEST PORT 1 VIEW  Result Date: 03/08/2020 CLINICAL DATA:  Preoperative assessment. EXAM: PORTABLE CHEST 1 VIEW COMPARISON:  Chest radiograph dated 01/26/2018. FINDINGS: The heart is enlarged. Both lungs are clear. Degenerative changes are seen in the spine. IMPRESSION: Cardiomegaly without acute cardiopulmonary process. Electronically Signed   By: Zerita Boers M.D.   On: 03/08/2020 17:34   DG Abd Portable 1V  Result Date: 03/06/2020 CLINICAL DATA:  Small-bowel obstruction. EXAM: PORTABLE ABDOMEN - 1 VIEW COMPARISON:  03/05/2020 abdominal radiographs and prior. FINDINGS: Indwelling enteric tube with tip and side hole overlying the gastric body. Increased conspicuity of gas-filled mildly dilated small bowel loops. Residual enteric contrast opacifies nondilated ascending colon. Bilateral double-J ureteral stents are unchanged. Sequela of bilateral hip arthroplasties. Partially imaged left hip surgical drain. IMPRESSION: Mildly dilated small bowel loops, more conspicuous than prior exam. Indwelling enteric tube and bilateral ureteral stents, unchanged. Electronically Signed   By: Primitivo Gauze M.D.   On: 03/06/2020 10:02   DG Abd Portable 1V-Small Bowel Obstruction Protocol-initial, 8 hr delay  Result Date: 03/05/2020 CLINICAL DATA:  SBO EXAM: PORTABLE ABDOMEN - 1 VIEW COMPARISON:  March 04, 2020 FINDINGS: Persistent gaseous dilation of loops of small bowel. Small bowel loop measures up to 5.2 cm, similar in comparison to prior. There is enteric contrast within a dilated loop of proximal small bowel. Enteric tube is coiled within the stomach with tip terminating over the distal stomach. Bilateral double-J stents. LEFT-sided surgical drain overlying the LEFT lateral leg. Soft tissue air of the LEFT lateral leg. Status post bilateral hip arthroplasty. Degenerative changes of the lower lumbar spine. IMPRESSION: Persistent gaseous dilation of loops of small bowel, similar in  comparison to prior. Enteric contrast is within the dilated proximal small bowel. Electronically Signed   By: Valentino Saxon MD   On: 03/05/2020 11:12   ECHOCARDIOGRAM COMPLETE  Result Date: 03/06/2020    ECHOCARDIOGRAM REPORT   Patient Name:   [name] Date of Exam: 03/06/2020  Medical Rec #:  [id number]        Height:       74.0 in Accession #:    [id number]       Weight:       243.2 lb Date of Birth:  10/28/1945        BSA:          2.362 m Patient Age:    87 years         BP:           171/78 mmHg Patient Gender: M                HR:           86 bpm. Exam Location:  Inpatient Procedure: 2D Echo, Cardiac Doppler and Color Doppler Indications:    I48.91* Unspeicified atrial fibrillation  History:        Patient has prior history of Echocardiogram examinations, most                 recent 06/24/2016. Previous Myocardial Infarction and CAD,                 Abnormal ECG, Arrythmias:Atrial Fibrillation,                 Signs/Symptoms:Chest Pain; Risk Factors:Diabetes, Former Smoker,                 Hypertension and Dyslipidemia. Cancer.  Sonographer:    Roseanna Rainbow RDCS Referring Phys: [phone] A CALDWELL POWELL JR  Sonographer Comments: Suboptimal parasternal window and suboptimal subcostal window. IMPRESSIONS  1. Left ventricular ejection fraction, by estimation, is 55 to 60%. The left ventricle has normal function. The left ventricle has no regional wall motion abnormalities. There is mild concentric left ventricular hypertrophy. Left ventricular diastolic function could not be evaluated.  2. Right ventricular systolic function is normal. The right ventricular size is normal.  3. Left atrial size was mildly dilated.  4. The pericardial effusion is circumferential.  5. The mitral valve is normal in structure. Mild mitral valve regurgitation. No evidence of mitral stenosis.  6. The aortic valve is normal in structure. Aortic valve regurgitation is not visualized. Mild aortic valve sclerosis is present,  with no evidence of aortic valve stenosis.  7. The inferior vena cava is normal in size with greater than 50% respiratory variability, suggesting right atrial pressure of 3 mmHg. Comparison(s): No significant change from prior study. Prior images reviewed side by side. FINDINGS  Left Ventricle: Left ventricular ejection fraction, by estimation, is 55 to 60%. The left ventricle has normal function. The left ventricle has no regional wall motion abnormalities. The left ventricular internal cavity size was normal in size. There is  mild concentric left ventricular hypertrophy. Left ventricular diastolic function could not be evaluated due to atrial fibrillation. Left ventricular diastolic function could not be evaluated. Right Ventricle: The right ventricular size is normal. No increase in right ventricular wall thickness. Right ventricular systolic function is normal. Left Atrium: Left atrial size was mildly dilated. Right Atrium: Right atrial size was normal in size. Pericardium: Trivial pericardial effusion is present. The pericardial effusion is circumferential. Mitral Valve: The mitral valve is normal in structure. Mild mitral annular calcification. Mild mitral valve regurgitation, with centrally-directed jet. No evidence of mitral valve stenosis. Tricuspid Valve: The tricuspid valve is normal in structure. Tricuspid valve regurgitation is not demonstrated. No evidence of tricuspid stenosis. Aortic Valve: The aortic valve is normal in structure. Aortic valve  regurgitation is not visualized. Mild aortic valve sclerosis is present, with no evidence of aortic valve stenosis. Pulmonic Valve: The pulmonic valve was not well visualized. Pulmonic valve regurgitation is not visualized. No evidence of pulmonic stenosis. Aorta: The aortic root is normal in size and structure. Venous: The inferior vena cava is normal in size with greater than 50% respiratory variability, suggesting right atrial pressure of 3 mmHg.  IAS/Shunts: No atrial level shunt detected by color flow Doppler.  LEFT VENTRICLE PLAX 2D LVIDd:         4.00 cm      Diastology LVIDs:         2.80 cm      LV e' medial:    13.50 cm/s LV PW:         1.60 cm      LV E/e' medial:  6.0 LV IVS:        1.50 cm      LV e' lateral:   17.20 cm/s LVOT diam:     1.90 cm      LV E/e' lateral: 4.7 LV SV:         54 LV SV Index:   23 LVOT Area:     2.84 cm  LV Volumes (MOD) LV vol d, MOD A2C: 58.0 ml LV vol d, MOD A4C: 107.0 ml LV vol s, MOD A2C: 29.0 ml LV vol s, MOD A4C: 41.2 ml LV SV MOD A2C:     29.0 ml LV SV MOD A4C:     107.0 ml LV SV MOD BP:      47.0 ml RIGHT VENTRICLE         IVC TAPSE (M-mode): 2.7 cm  IVC diam: 1.70 cm LEFT ATRIUM             Index       RIGHT ATRIUM           Index LA diam:        3.70 cm 1.57 cm/m  RA Area:     15.30 cm LA Vol (A2C):   39.4 ml 16.68 ml/m RA Volume:   37.00 ml  15.67 ml/m LA Vol (A4C):   58.0 ml 24.56 ml/m LA Biplane Vol: 49.4 ml 20.92 ml/m  AORTIC VALVE LVOT Vmax:   110.00 cm/s LVOT Vmean:  75.100 cm/s LVOT VTI:    0.192 m  AORTA Ao Root diam: 3.30 cm MITRAL VALVE MV Area (PHT): 4.60 cm    SHUNTS MV Decel Time: 165 msec    Systemic VTI:  0.19 m MV E velocity: 81.40 cm/s  Systemic Diam: 1.90 cm Dani Gobble Croitoru MD Electronically signed by Sanda Klein MD Signature Date/Time: 03/06/2020/2:21:39 PM    Final    [place] FNA SOFT TISSUE  Result Date: 03/04/2020 INDICATION: History of left total hip replacement, now with concern for infection. Please perform ultrasound-guided aspiration and/or drainage catheter placement for infection source control purposes. EXAM: ULTRASOUND-GUIDED DRAINAGE CATHETER PLACEMENT INTO DOMINANT FLUID COLLECTION INVOLVING THE PROXIMAL LATERAL ASPECT THE LEFT THIGH COMPARISON:  Left hip CT-03/01/2020 MEDICATIONS: The patient is currently admitted to the hospital and receiving intravenous antibiotics. The antibiotics were administered within an appropriate time frame prior to the initiation of the  procedure. ANESTHESIA/SEDATION: Moderate (conscious) sedation was employed during this procedure. A total of Versed 2 mg and Fentanyl 100 mcg was administered intravenously. Moderate Sedation Time: 18 minutes. The patient's level of consciousness and vital signs were monitored continuously by radiology nursing throughout the procedure under my direct  supervision. CONTRAST:  None COMPLICATIONS: None immediate. PROCEDURE: Informed written consent was obtained from the patient after a discussion of the risks, benefits and alternatives to treatment. The patient was placed supine on his hospital bed with ultrasound imaging demonstrating a large (at least 7.6 x 1.6 cm complex fluid collection with the subcutaneous tissues involving the lateral aspect of the proximal left thigh (representative image 2). The procedure was planned. A timeout was performed prior to the initiation of the procedure. The skin overlying the lateral aspect the left thigh was prepped and draped in the usual sterile fashion. The overlying soft tissues were anesthetized with 1% lidocaine with epinephrine. Utilizing a caudal to cranial approach, an 18 gauge trocar needle was advanced through near the entirety of the abscess/fluid collection within the left lateral thigh and a short Amplatz super stiff wire was coiled within the cranial component of the collection. Appropriate positioning was confirmed ultrasound imaging. The tract was serially dilated allowing placement of a 12 [place] all-purpose drainage catheter. Appropriate positioning was confirmed with a limited postprocedural CT scan. Next, approximately 50 cc of purulent fluid was aspirated. The tube was connected to a JP bulb and sutured in place. A dressing was placed. The patient tolerated the procedure well without immediate post procedural complication. IMPRESSION: Successful CT guided placement of a 5 French all purpose drain catheter into the complex fluid collection involving the  proximal lateral aspect the left thigh with aspiration of 50 cc of purulent fluid. Samples were sent to the laboratory as requested by the ordering clinical team. Electronically Signed   By: Sandi Mariscal M.D.   On: 03/04/2020 15:43   DG Hip Unilat W or Wo Pelvis 2-3 Views Left  Result Date: 03/01/2020 CLINICAL DATA:  Left hip pain radiating down left leg to knee 1 week. EXAM: DG HIP (WITH OR WITHOUT PELVIS) 2-3V LEFT COMPARISON:  Abdominal films 12/05/2019 FINDINGS: Examination demonstrates bilateral hip arthroplasties intact and unchanged. Minimal air in the soft tissues lateral to the left hip. Known bilateral ureteral stents are present as inferior aspect of the stents come together in the midline of the pelvis extending into the region of the trigone. Moderate degenerate change of the spine. No acute fracture or dislocation. IMPRESSION: 1. Bilateral hip arthroplasties intact and unchanged. No acute fracture or dislocation. Suggestion of mild air over the soft tissues lateral to the left hip as recommend clinical correlation with recent intervention. Soft tissue infection could produce this appearance. 2. Known bilateral ureteral stents as described. Electronically Signed   By: Marin Olp M.D.   On: 03/01/2020 09:21    Labs:  CBC: Recent Labs    03/07/20 0659 03/08/20 0610 03/09/20 0419 03/10/20 0633  WBC 7.9 7.3 7.6 13.3*  HGB 9.4* 8.8* 9.0* 10.7*  HCT 29.2* 28.4* 28.6* 32.8*  PLT 573* 467* 500* 449*    COAGS: Recent Labs    03/04/20 1252  INR 1.1    BMP: Recent Labs    10/24/19 0807 10/24/19 0807 12/06/19 0736 12/06/19 0736 01/06/20 0751 01/06/20 0751 01/13/20 0716 01/14/20 1027 03/07/20 0659 03/08/20 0610 03/09/20 0419 03/10/20 0633  NA 135   < > 137   < > 136   < > 136   < > 140 140 138 138  K 4.8   < > 5.4*   < > 5.3   < > 6.4*   < > 3.9 4.1 4.4 5.1  CL 109   < > 109   < > 108   < >  110   < > 102 101 101 104  CO2 18*   < > 21   < > 20   < > 19*   < > 25 26 25  22   GLUCOSE 145*   < > 152*   < > 107*   < > 58*   < > 241* 158* 148* 194*  BUN 33*   < > 43*   < > 55*   < > 52*   < > 131* 87* 57* 51*  CALCIUM 9.0   < > 9.0   < > 9.0   < > 8.9   < > 8.0* 8.2* 8.3* 8.1*  CREATININE 1.62*   < > 2.02*   < > 3.11*   < > 3.31*   < > 2.55* 1.82* 1.44* 1.69*  GFRNONAA 41*   < > 32*   < > 19*   < > 17*   < > 26* 38* 51* 42*  GFRAA 48*  --  37*  --  22*  --  20*  --   --   --   --   --    < > = values in this interval not displayed.    LIVER FUNCTION TESTS: Recent Labs    03/07/20 0659 03/08/20 0610 03/09/20 0419 03/10/20 0633  BILITOT 0.6 0.5 0.6 0.9  AST 16 22 44* 76*  ALT 14 17 33 67*  ALKPHOS 116 101 94 97  PROT 7.4 7.3 7.1 6.9  ALBUMIN 2.4* 2.4* 2.3* 2.4*    Assessment and Plan: 74 y.o. male inpatient. History of OA, anemia, Asthma, CAD with prior MI, GERD, HTN, CKD, prostate cancer, diabetes. Total hp arthroplasty in 2013. Admitted to [facility] for hip pain found to have left hip septic arthritis/ prosthetic joint infection. IR placed a left thigh drain on 11.24.21. Drain was removed by surgery during I&D with left hip liner exchange on 11.29.21. Team is requesting a tunneled central line for ongoing antibiotics administration.  WBC 13.3 Cr 1.69, BUN 51, AST 76, ALT 67 cultures pending. Patient is on a heparin gtt. Allergies include Shellfish. Patient last ate breakfast.   IR consulted for possible tunneled central line. Case has been reviewed and procedure approved by Dr. Earleen Newport.  Patient tentatively scheduled for 11.30.  Team instructed to: Keep Patient to be NPO after midnight RN will be instructed time of procedure to hold heparin gtt accordingly.  IR will call patient when ready.  Risks and benefits of image guided tunneled central line placement was discussed with the patient including, but not limited to bleeding, infection, pneumothorax, or fibrin sheath development and need for additional procedures.  All of the patient's questions were  answered, patient is agreeable to proceed. Consent signed and in chart.  Thank you for this interesting consult.  I greatly enjoyed meeting [name] and look forward to participating in their care.  A copy of this report was sent to the requesting provider on this date.  Electronically Signed: Jacqualine Mau, NP 03/10/2020, 2:10 PM   I spent a total of 40 Minutes    in face to face in clinical consultation, greater than 50% of which was counseling/coordinating care for tunneled central line placement.

## 2020-03-10 NOTE — Progress Notes (Signed)
Subjective:  Patient denies any chest pain or shortness of breath.  Denies any palpitations. Had few beats of nonsustained VT on the monitor, asymptomatic remains in atrial flutter with controlled ventricular response.  Tolerated incision and drainage left hip with liner exchange yesterday  Objective:  Vital Signs in the last 24 hours: Temp:  [97.9 F (36.6 C)-99 F (37.2 C)] 98.9 F (37.2 C) (11/30 1031) Pulse Rate:  [54-111] 95 (11/30 1031) Resp:  [12-20] 15 (11/30 1031) BP: (156-186)/(65-96) 157/81 (11/30 1031) SpO2:  [95 %-100 %] 99 % (11/30 1031)  Intake/Output from previous day: 11/29 0701 - 11/30 0700 In: 3751.4 [I.V.:2976.4; Blood:750] Out: 2050 [Urine:1000; Drains:450; Blood:600] Intake/Output from this shift: Total I/O In: 360 [P.O.:360] Out: -   Physical Exam: Neck: no adenopathy, no carotid bruit, no JVD and supple, symmetrical, trachea midline Lungs: clear to auscultation bilaterally Heart: regularly irregular rhythm, S1, S2 normal and soft systolic murmur noted Abdomen: soft, mildly distended.  Bowel sounds present Extremities: extremities normal, atraumatic, no cyanosis or edema  Lab Results: Recent Labs    03/09/20 0419 03/10/20 0633  WBC 7.6 13.3*  HGB 9.0* 10.7*  PLT 500* 449*   Recent Labs    03/09/20 0419 03/10/20 0633  NA 138 138  K 4.4 5.1  CL 101 104  CO2 25 22  GLUCOSE 148* 194*  BUN 57* 51*  CREATININE 1.44* 1.69*   No results for input(s): TROPONINI in the last 72 hours.  Invalid input(s): CK, MB Hepatic Function Panel Recent Labs    03/10/20 0633  PROT 6.9  ALBUMIN 2.4*  AST 76*  ALT 67*  ALKPHOS 97  BILITOT 0.9  BILIDIR 0.2  IBILI 0.7   No results for input(s): CHOL in the last 72 hours. No results for input(s): PROTIME in the last 72 hours.  Imaging: Imaging results have been reviewed and DG CHEST PORT 1 VIEW  Result Date: 03/08/2020 CLINICAL DATA:  Preoperative assessment. EXAM: PORTABLE CHEST 1 VIEW COMPARISON:   Chest radiograph dated 01/26/2018. FINDINGS: The heart is enlarged. Both lungs are clear. Degenerative changes are seen in the spine. IMPRESSION: Cardiomegaly without acute cardiopulmonary process. Electronically Signed   By: Zerita Boers M.D.   On: 03/08/2020 17:34    Cardiac Studies:  Assessment/Plan:  Atrial flutter with controlled ventricular response chads Vasc score of 3 on chronic anticoagulation now Nonsustained VT, asymptomatic Coronary artery disease history of MI in remote past stable Left hip septic arthritis Hypertension Diabetes mellitus Hyperlipidemia Degenerative joint disease History of carcinoma of the prostate History of nephrolithiasis status post bilateral ureteric stents Chronic kidney disease stage IV improved Anemia of chronic disease Plan And low-dose amiodarone 200 mg daily. Continue IV heparin and IV Cardizem for now.  Will switch upon discharge to by mouth Cardizem and eliquis.  Discussed with patient briefly regarding flutter ablation as an outpatient after all the infections have been cleared.  LOS: 6 days    Charolette Forward 03/10/2020, 1:29 PM

## 2020-03-10 NOTE — Procedures (Signed)
Interventional Radiology Procedure Note  Procedure: Placement of a right IJ approach tunneled double lumen CVC, cuffed.  Tip is positioned at the superior cavoatrial junction and catheter is ready for immediate use.  Complications: None Recommendations:  - Ok to use - Do not submerge - Routine line care   Signed,  Dulcy Fanny. Earleen Newport, DO

## 2020-03-10 NOTE — Progress Notes (Addendum)
Subjective: No new complaints   Antibiotics:  Anti-infectives (From admission, onward)   Start     Dose/Rate Route Frequency Ordered Stop   03/06/20 1700  ceFEPIme (MAXIPIME) 2 g in sodium chloride 0.9 % 100 mL IVPB        2 g 200 mL/hr over 30 Minutes Intravenous Every 12 hours 03/06/20 1340     03/05/20 1000  piperacillin-tazobactam (ZOSYN) IVPB 3.375 g  Status:  Discontinued        3.375 g 12.5 mL/hr over 240 Minutes Intravenous Every 8 hours 03/05/20 0904 03/06/20 1311      Medications: Scheduled Meds: . atorvastatin  40 mg Oral Daily  . bisacodyl  10 mg Rectal Daily  . Chlorhexidine Gluconate Cloth  6 each Topical Daily  . docusate sodium  100 mg Oral BID  . ferrous sulfate  325 mg Oral Q breakfast  . gabapentin  300 mg Oral TID  . insulin aspart  0-15 Units Subcutaneous TID WC  . insulin aspart  0-5 Units Subcutaneous QHS  . lip balm  1 application Topical BID  . metoprolol tartrate  12.5 mg Oral BID  . pantoprazole  40 mg Oral Daily  . polyethylene glycol  17 g Oral BID  . sodium chloride flush  5 mL Intracatheter Q8H   Continuous Infusions: . sodium chloride 75 mL/hr at 03/09/20 2110  . ceFEPime (MAXIPIME) IV 2 g (03/10/20 0533)  . diltiazem (CARDIZEM) infusion 15 mg/hr (03/10/20 0937)  . heparin 1,500 Units/hr (03/10/20 0531)  . methocarbamol (ROBAXIN) IV     PRN Meds:.acetaminophen, albuterol, alum & mag hydroxide-simeth, bisacodyl, HYDROmorphone (DILAUDID) injection, magic mouthwash, magnesium citrate, menthol-cetylpyridinium, menthol-cetylpyridinium **OR** phenol, methocarbamol **OR** methocarbamol (ROBAXIN) IV, metoCLOPramide **OR** metoCLOPramide (REGLAN) injection, metoprolol tartrate, morphine injection, ondansetron **OR** ondansetron (ZOFRAN) IV, oxyCODONE, oxyCODONE, phenol, polyethylene glycol    Objective: Weight change:   Intake/Output Summary (Last 24 hours) at 03/10/2020 1101 Last data filed at 03/10/2020 0419 Gross per 24 hour    Intake 3726.39 ml  Output 1650 ml  Net 2076.39 ml   Blood pressure (!) 157/81, pulse 95, temperature 98.9 F (37.2 C), temperature source Oral, resp. rate 15, height _0  (1.753 m), weight 101.1 kg, SpO2 99 %. Temp:  [97.9 F (36.6 C)-99.1 F (37.3 C)] 98.9 F (37.2 C) (11/30 1031) Pulse Rate:  [54-111] 95 (11/30 1031) Resp:  [12-20] 15 (11/30 1031) BP: (145-186)/(65-96) 157/81 (11/30 1031) SpO2:  [94 %-100 %] 99 % (11/30 1031)  Physical Exam: General: Alert and awake, oriented x3,  HEENT: anicteric sclera, EOMI CVS regular rate, normal  Chest: , no wheezing, no respiratory distress Abdomen: soft non-distended,  dressing in place Skin: no rashes Neuro: nonfocal  CBC:    BMET Recent Labs    03/09/20 0419 03/10/20 0633  NA 138 138  K 4.4 5.1  CL 101 104  CO2 25 22  GLUCOSE 148* 194*  BUN 57* 51*  CREATININE 1.44* 1.69*  CALCIUM 8.3* 8.1*     Liver Panel  Recent Labs    03/09/20 0419 03/10/20 0633  PROT 7.1 6.9  ALBUMIN 2.3* 2.4*  AST 44* 76*  ALT 33 67*  ALKPHOS 94 97  BILITOT 0.6 0.9  BILIDIR  --  0.2  IBILI  --  0.7       Sedimentation Rate No results for input(s): ESRSEDRATE in the last 72 hours. C-Reactive Protein No results for input(s): CRP in the last 72 hours.  Micro Results:  Recent Results (from the past 720 hour(s))  Urine Culture     Status: None   Collection Time: 02/21/20 11:27 AM   Specimen: Urine  Result Value Ref Range Status   MICRO NUMBER: [id number]  Final   SPECIMEN QUALITY: Adequate  Final   Sample Source URINE, CLEAN CATCH  Final   STATUS: FINAL  Final   Result:   Final    Less than 10,000 CFU/mL of single Gram negative organism isolated. No further testing will be performed. If clinically indicated, recollection using a method to minimize contamination, with prompt transfer to Urine Culture Transport Tube, is recommended.  Resp Panel by RT-PCR (Flu A&B, Covid) Nasopharyngeal Swab     Status: None   Collection  Time: 03/01/20 12:20 PM   Specimen: Nasopharyngeal Swab; Nasopharyngeal(NP) swabs in vial transport medium  Result Value Ref Range Status   SARS Coronavirus 2 by RT PCR NEGATIVE NEGATIVE Final    Comment: (NOTE) SARS-CoV-2 target nucleic acids are NOT DETECTED.  The SARS-CoV-2 RNA is generally detectable in upper respiratory specimens during the acute phase of infection. The lowest concentration of SARS-CoV-2 viral copies this assay can detect is 138 copies/mL. A negative result does not preclude SARS-Cov-2 infection and should not be used as the sole basis for treatment or other patient management decisions. A negative result may occur with  improper specimen collection/handling, submission of specimen other than nasopharyngeal swab, presence of viral mutation(s) within the areas targeted by this assay, and inadequate number of viral copies(<138 copies/mL). A negative result must be combined with clinical observations, patient history, and epidemiological information. The expected result is Negative.  Fact Sheet for Patients:  EntrepreneurPulse.com.au  Fact Sheet for Healthcare Providers:  IncredibleEmployment.be  This test is no t yet approved or cleared by the [place] FDA and  has been authorized for detection and/or diagnosis of SARS-CoV-2 by FDA under an Emergency Use Authorization (EUA). This EUA will remain  in effect (meaning this test can be used) for the duration of the COVID-19 declaration under Section 564(b)(1) of the Act, 21 U.S.C.section 360bbb-3(b)(1), unless the authorization is terminated  or revoked sooner.       Influenza A by PCR NEGATIVE NEGATIVE Final   Influenza B by PCR NEGATIVE NEGATIVE Final    Comment: (NOTE) The Xpert Xpress SARS-CoV-2/FLU/RSV plus assay is intended as an aid in the diagnosis of influenza from Nasopharyngeal swab specimens and should not be used as a sole basis for treatment. Nasal washings  and aspirates are unacceptable for Xpert Xpress SARS-CoV-2/FLU/RSV testing.  Fact Sheet for Patients: EntrepreneurPulse.com.au  Fact Sheet for Healthcare Providers: IncredibleEmployment.be  This test is not yet approved or cleared by the [place] FDA and has been authorized for detection and/or diagnosis of SARS-CoV-2 by FDA under an Emergency Use Authorization (EUA). This EUA will remain in effect (meaning this test can be used) for the duration of the COVID-19 declaration under Section 564(b)(1) of the Act, 21 U.S.C. section 360bbb-3(b)(1), unless the authorization is terminated or revoked.  Performed at [facility], [place] [address], [place], [place] [address]   Culture, blood (routine x 2)     Status: Abnormal   Collection Time: 03/03/20  7:04 PM   Specimen: BLOOD LEFT HAND  Result Value Ref Range Status   Specimen Description   Final    BLOOD LEFT HAND Performed at [facility] [address], [place],  [address]    Special Requests   Final  BOTTLES DRAWN AEROBIC ONLY Blood Culture adequate volume Performed at [facility] [address], [place], [place] [address]    Culture  Setup Time   Final    AEROBIC BOTTLE ONLY GRAM NEGATIVE RODS CRITICAL RESULT CALLED TO, READ BACK BY AND VERIFIED WITH: Ulice Bold PharmD 9:20 03/06/20 ([email or web address]) Performed at [facility], [place] [address], [place], [place] [address]    Culture ENTEROBACTER CLOACAE (A)  Final   Report Status 03/08/2020 FINAL  Final   Organism ID, Bacteria ENTEROBACTER CLOACAE  Final      Susceptibility   Enterobacter cloacae - MIC*    CEFAZOLIN >=64 RESISTANT Resistant     CEFEPIME <=0.12 SENSITIVE Sensitive     CEFTAZIDIME <=1 SENSITIVE Sensitive     CIPROFLOXACIN <=0.25 SENSITIVE Sensitive     GENTAMICIN <=1 SENSITIVE Sensitive     IMIPENEM <=0.25 SENSITIVE Sensitive     TRIMETH/SULFA <=20  SENSITIVE Sensitive     PIP/TAZO <=4 SENSITIVE Sensitive     * ENTEROBACTER CLOACAE  Blood Culture ID Panel (Reflexed)     Status: Abnormal   Collection Time: 03/03/20  7:04 PM  Result Value Ref Range Status   Enterococcus faecalis NOT DETECTED NOT DETECTED Final   Enterococcus Faecium NOT DETECTED NOT DETECTED Final   Listeria monocytogenes NOT DETECTED NOT DETECTED Final   Staphylococcus species NOT DETECTED NOT DETECTED Final   Staphylococcus aureus (BCID) NOT DETECTED NOT DETECTED Final   Staphylococcus epidermidis NOT DETECTED NOT DETECTED Final   Staphylococcus lugdunensis NOT DETECTED NOT DETECTED Final   Streptococcus species NOT DETECTED NOT DETECTED Final   Streptococcus agalactiae NOT DETECTED NOT DETECTED Final   Streptococcus pneumoniae NOT DETECTED NOT DETECTED Final   Streptococcus pyogenes NOT DETECTED NOT DETECTED Final   A.calcoaceticus-baumannii NOT DETECTED NOT DETECTED Final   Bacteroides fragilis NOT DETECTED NOT DETECTED Final   Enterobacterales DETECTED (A) NOT DETECTED Final    Comment: Enterobacterales represent a large order of gram negative bacteria, not a single organism. CRITICAL RESULT CALLED TO, READ BACK BY AND VERIFIED WITH: Ulice Bold PharmD 9:20 03/06/20 ([email or web address])    Enterobacter cloacae complex DETECTED (A) NOT DETECTED Final    Comment: CRITICAL RESULT CALLED TO, READ BACK BY AND VERIFIED WITH: Ulice Bold PharmD 9:20 03/06/20 ([email or web address])    Escherichia coli NOT DETECTED NOT DETECTED Final   Klebsiella aerogenes NOT DETECTED NOT DETECTED Final   Klebsiella oxytoca NOT DETECTED NOT DETECTED Final   Klebsiella pneumoniae NOT DETECTED NOT DETECTED Final   Proteus species NOT DETECTED NOT DETECTED Final   Salmonella species NOT DETECTED NOT DETECTED Final   Serratia marcescens NOT DETECTED NOT DETECTED Final   Haemophilus influenzae NOT DETECTED NOT DETECTED Final   Neisseria meningitidis NOT DETECTED NOT DETECTED Final   Pseudomonas aeruginosa NOT  DETECTED NOT DETECTED Final   Stenotrophomonas maltophilia NOT DETECTED NOT DETECTED Final   Candida albicans NOT DETECTED NOT DETECTED Final   Candida auris NOT DETECTED NOT DETECTED Final   Candida glabrata NOT DETECTED NOT DETECTED Final   Candida krusei NOT DETECTED NOT DETECTED Final   Candida parapsilosis NOT DETECTED NOT DETECTED Final   Candida tropicalis NOT DETECTED NOT DETECTED Final   Cryptococcus neoformans/gattii NOT DETECTED NOT DETECTED Final   CTX-M ESBL NOT DETECTED NOT DETECTED Final   Carbapenem resistance IMP NOT DETECTED NOT DETECTED Final   Carbapenem resistance KPC NOT DETECTED NOT DETECTED Final   Carbapenem resistance NDM NOT DETECTED NOT DETECTED Final   Carbapenem resist  OXA 48 LIKE NOT DETECTED NOT DETECTED Final   Carbapenem resistance VIM NOT DETECTED NOT DETECTED Final    Comment: Performed at [facility], [place] [address], [place], [place] [address]  Culture, blood (routine x 2)     Status: None   Collection Time: 03/03/20  7:06 PM   Specimen: BLOOD RIGHT HAND  Result Value Ref Range Status   Specimen Description   Final    BLOOD RIGHT HAND Performed at [facility] [address], [place], [place] [address]    Special Requests   Final    BOTTLES DRAWN AEROBIC ONLY Blood Culture adequate volume Performed at [facility] [address], [place], [place] [address]    Culture   Final    NO GROWTH 5 DAYS Performed at [facility], [place] [address], [place], [place] [address]    Report Status 03/08/2020 FINAL  Final  Aerobic/Anaerobic Culture (surgical/deep wound)     Status: None   Collection Time: 03/04/20  3:38 PM   Specimen: Abscess  Result Value Ref Range Status   Specimen Description   Final    ABSCESS DRAIN LEFT LATERAL THIGH Performed at [facility] [address] [place], [place] [address]    Special Requests   Final    NONE Performed at [facility], [place] [address] [place], [place] [address]    Gram Stain   Final    ABUNDANT WBC PRESENT,BOTH PMN AND MONONUCLEAR ABUNDANT GRAM NEGATIVE RODS    Culture   Final    ABUNDANT ENTEROBACTER CLOACAE NO ANAEROBES ISOLATED Performed at [facility], [place] [address] [place], [place] [address]    Report Status 03/09/2020 FINAL  Final   Organism ID, Bacteria ENTEROBACTER CLOACAE  Final      Susceptibility   Enterobacter cloacae - MIC*    CEFAZOLIN >=64 RESISTANT Resistant     CEFEPIME <=0.12 SENSITIVE Sensitive     CEFTAZIDIME <=1 SENSITIVE Sensitive     CIPROFLOXACIN <=0.25 SENSITIVE Sensitive     GENTAMICIN <=1 SENSITIVE Sensitive     IMIPENEM 0.5 SENSITIVE Sensitive     TRIMETH/SULFA <=20 SENSITIVE Sensitive     PIP/TAZO <=4 SENSITIVE Sensitive     * ABUNDANT ENTEROBACTER CLOACAE  Culture, Urine     Status: Abnormal   Collection Time: 03/05/20  8:10 AM   Specimen: Urine, Random  Result Value Ref Range Status   Specimen Description   Final    URINE, RANDOM Performed at [facility], [place] [address] [place], [place] [address]    Special Requests   Final    NONE Performed at [facility], [place] [address] [place], [place] [address]    Culture >=100,000 COLONIES/mL ENTEROBACTER SPECIES (A)  Final   Report Status 03/07/2020 FINAL  Final   Organism ID, Bacteria ENTEROBACTER SPECIES (A)  Final      Susceptibility   Enterobacter species - MIC*    CEFAZOLIN >=64 RESISTANT Resistant     CEFEPIME <=0.12 SENSITIVE Sensitive     CEFTRIAXONE <=0.25 SENSITIVE Sensitive     CIPROFLOXACIN <=0.25 SENSITIVE Sensitive     GENTAMICIN <=1 SENSITIVE Sensitive     IMIPENEM 0.5 SENSITIVE Sensitive     NITROFURANTOIN 32 SENSITIVE Sensitive     TRIMETH/SULFA <=20 SENSITIVE Sensitive     PIP/TAZO <=4 SENSITIVE Sensitive     * >=100,000 COLONIES/mL ENTEROBACTER SPECIES  Culture, blood (Routine X 2)  w Reflex to ID Panel     Status: None (Preliminary  result)   Collection Time: 03/09/20 12:50 PM   Specimen: BLOOD  Result Value Ref Range Status   Specimen Description   Final    BLOOD RIGHT WRIST Performed at [facility] [address], [place], [place] [address]    Special Requests   Final    BOTTLES DRAWN AEROBIC ONLY Blood Culture adequate volume Performed at [facility] [address], [place], [place] [address]    Culture   Final    NO GROWTH < 24 HOURS Performed at [facility] [address], [place], [place] [address]    Report Status PENDING  Incomplete  Culture, blood (Routine X 2) w Reflex to ID Panel     Status: None (Preliminary result)   Collection Time: 03/09/20 12:50 PM   Specimen: BLOOD RIGHT HAND  Result Value Ref Range Status   Specimen Description   Final    BLOOD RIGHT HAND Performed at [facility] [address] [place], [place] [address]    Special Requests   Final    BOTTLES DRAWN AEROBIC AND ANAEROBIC Blood Culture adequate volume Performed at [facility] [address], [place], [place] [address]    Culture   Final    NO GROWTH < 24 HOURS Performed at [facility] [address], [place], [place] [address]    Report Status PENDING  Incomplete  Aerobic/Anaerobic Culture (surgical/deep wound)     Status: None (Preliminary result)   Collection Time: 03/09/20  3:53 PM   Specimen: PATH Other; Tissue  Result Value Ref Range Status   Specimen Description   Final    ABSCESS RIGHT HIP Performed at [facility] [address], [place], [place] [address]    Special Requests   Final    NONE Performed at [facility], [place] [address] [place], [place] [address]    Gram Stain   Final    FEW WBC PRESENT,BOTH PMN AND MONONUCLEAR FEW GRAM NEGATIVE RODS RARE GRAM POSITIVE COCCI    Culture   Final    TOO YOUNG TO READ Performed at [facility], [place] [address]  [place], [place] [address]    Report Status PENDING  Incomplete    Studies/Results: DG CHEST PORT 1 VIEW  Result Date: 03/08/2020 CLINICAL DATA:  Preoperative assessment. EXAM: PORTABLE CHEST 1 VIEW COMPARISON:  Chest radiograph dated 01/26/2018. FINDINGS: The heart is enlarged. Both lungs are clear. Degenerative changes are seen in the spine. IMPRESSION: Cardiomegaly without acute cardiopulmonary process. Electronically Signed   By: Zerita Boers M.D.   On: 03/08/2020 17:34      Assessment/Plan:  INTERVAL HISTORY: Had surgery yesterday Gram stain showed gram-negative rods which we expected to see but also a few rare gram-positive cocci  Active Problems:   OA (osteoarthritis) of hip   Type 2 diabetes mellitus with hypoglycemia without coma ([place])   Essential hypertension   Hyperlipidemia with target LDL less than 70   Hip pain   Effusion of hip joint, left   Abdominal distension   SBO (small bowel obstruction) ([facility])   Bacteremia    [name] is a 74 y.o. male with  74 y.o.malewith PMHx of CAD, DM2, HTN, CKD, OA, s/p left hip arthoplasty (2001), prostate cancer, PAF, left ureteral stone s/p stenting (01/2020) who was sent from ortho clinic on 11/23 by  Dr Wynelle Link. He is growing Enterobacter from blood, urine and hip aspirate. He is going for surgery today  #1 Enterobacter bacteremia: Check blood cultures to ensure clearing   #2 Prosthetic hip infection: sp staged revision with I and D yesterday  GS with expected GNR and also GPC (worry latter could be a contaminant but may feel obliged to treat if so I will stay away from vancomycin if this is a MR COAG neg staph or MRSA)  He will need 6 weeks of IV atnbiotics (cefepime given concern for AMP C with Enterobacter)  followed by oral antibiotics to get to 6 months if not longer  Central line ordered   #3 Kidney stones, ureteral stents. 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    Diagnosis: Prosthetic hip  infection  Culture Result: Enterobacter and some intraoperative culture data pending  Allergies  Allergen Reactions  . Shellfish Allergy Rash    OPAT Orders Discharge antibiotics:  Cefepime   Duration:  6-weeks  End Date:  April 20, 2020  Central line care Per Protocol:    Labs  weekly while on IV antibiotics: _x_ CBC with differential _x_ BMP w GFR/CMP _x_ CRP _x_ ESR  __ CK  _x_ Please have radiology pull central line completion of IV antibiotics __ Please leave PIC in place until doctor has seen patient or been notified  Fax weekly labs to [phone]   JAECION DEMPSTER has an appointment on 04/13/2020 with Dr. Tommy Medal at 3:30 PM  The [facility] for Infectious Disease is located in the [facility] at  [place] in [place].  [address], which is located to the left of the elevators.  Phone: [phone]  Fax: [phone]  [email or web address]  He should arrive at least 52mnutes prior to the appointment.    LOS: 6 days   Aaron Evener11/30/2021, 11:01 AM

## 2020-03-10 NOTE — Progress Notes (Signed)
Subjective: 1 Day Post-Op Procedure(s) (LRB): INCISION AND DRAINAGE LEFT HIP WITH LINER EXCHANGE (Left) Patient reports pain as mild.   Patient seen in rounds by Dr. Wynelle Link. Patient is feeling better this morning following irrigation/debridement, with bearing surface exchange of his left hip yesterday afternoon. Patient resting comfortably in bed with hemovac drain in place. No acute overnight events. Patient denies SOB, chest pain, or calf pain.  We will begin physical therapy today while following posterior hip precautions.   Objective: Vital signs in last 24 hours: Temp:  [98.2 F (36.8 C)-99.1 F (37.3 C)] 98.2 F (36.8 C) (11/30 0400) Pulse Rate:  [54-111] 75 (11/30 0400) Resp:  [12-20] 14 (11/30 0400) BP: (145-186)/(65-96) 179/89 (11/30 0400) SpO2:  [94 %-100 %] 96 % (11/30 0400)  Intake/Output from previous day:  Intake/Output Summary (Last 24 hours) at 03/10/2020 0708 Last data filed at 03/10/2020 0419 Gross per 24 hour  Intake 3751.39 ml  Output 2050 ml  Net 1701.39 ml     Intake/Output this shift: No intake/output data recorded.  Labs: Recent Labs    03/08/20 0610 03/09/20 0419 03/10/20 0633  HGB 8.8* 9.0* 10.7*   Recent Labs    03/09/20 0419 03/10/20 0633  WBC 7.6 13.3*  RBC 3.02* 3.56*  HCT 28.6* 32.8*  PLT 500* 449*   Recent Labs    03/08/20 0610 03/09/20 0419  NA 140 138  K 4.1 4.4  CL 101 101  CO2 26 25  BUN 87* 57*  CREATININE 1.82* 1.44*  GLUCOSE 158* 148*  CALCIUM 8.2* 8.3*   No results for input(s): LABPT, INR in the last 72 hours.  Exam: General - Patient is Alert and Oriented Extremity - Neurologically intact Neurovascular intact Sensation intact distally Dressing - scant drainage at site of drain output. Aquacel dressing C/D/I. Motor Function - intact, moving foot and toes well on exam.   Past Medical History:  Diagnosis Date  . Arthritis   . Asthma    no inhaler  . Coronary artery disease    cardiologist-  dr  spruill; last visit 3 mos ago per pt  . GERD (gastroesophageal reflux disease)   . History of MI (myocardial infarction)    1985  . Hydronephrosis, left   . Hypertension   . Myocardial infarction ([facility])   . Nephrolithiasis    left  . 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 Presence of tooth-root and mandibular implants    lower dental implants  . Prostate cancer ([place])   . Shortness of breath    WITH EXERTION  . Type 2 diabetes mellitus ([facility])     Assessment/Plan: 1 Day Post-Op Procedure(s) (LRB): INCISION AND DRAINAGE LEFT HIP WITH LINER EXCHANGE (Left) Active Problems:   OA (osteoarthritis) of hip   Type 2 diabetes mellitus with hypoglycemia without coma (H[facility])   Essential hypertension   Hyperlipidemia with target LDL less than 70   Hip pain   Effusion of hip joint, left   Abdominal distension   SBO (small bowel obstruction) (H[facility])   Bacteremia  Estimated body mass index is 32.91 kg/m as calculated from the following:   Height as of this encounter: 5\' 9"  (1.753 m).   Weight as of this encounter: 101.1 kg. Up with therapy  DVT Prophylaxis - Restart IV Heparin today per hospitalist service.  Posterior hip precautions in place following surgery yesterday.  To be seen/evaluated by physical therapy today for gait training and hip exercises with posterior hip  precautions.  Plan is to go Home after hospital stay.  Fenton Foy, [place], PA-C Orthopedic Surgery 03/10/2020, 7:08 AM

## 2020-03-10 NOTE — Plan of Care (Signed)
  Problem: Clinical Measurements: Goal: Will remain free from infection Outcome: Progressing Goal: Diagnostic test results will improve Outcome: Progressing   Problem: Activity: Goal: Risk for activity intolerance will decrease Outcome: Progressing   Problem: Nutrition: Goal: Adequate nutrition will be maintained Outcome: Progressing   Problem: Pain Managment: Goal: General experience of comfort will improve Outcome: Progressing   Problem: Safety: Goal: Ability to remain free from injury will improve Outcome: Progressing   Problem: Skin Integrity: Goal: Risk for impaired skin integrity will decrease Outcome: Progressing   

## 2020-03-10 NOTE — Progress Notes (Signed)
[facility] for heparin Indication: atrial fibrillation  Allergies  Allergen Reactions  . Shellfish Allergy Rash    Patient Measurements: Height: 5\' 9"  (175.3 cm) Weight: 101.1 kg (222 lb 14.2 oz) IBW/kg (Calculated) : 70.7 Heparin Dosing Weight: 92 kg  Vital Signs: Temp: 98.9 F (37.2 C) (11/30 1031) Temp Source: Oral (11/30 1031) BP: 157/81 (11/30 1031) Pulse Rate: 95 (11/30 1031)  Labs: Recent Labs    03/08/20 0610 03/08/20 0610 03/08/20 0829 03/08/20 1753 03/09/20 0419 03/10/20 0633 03/10/20 1319  HGB 8.8*   < >  --   --  9.0* 10.7*  --   HCT 28.4*  --   --   --  28.6* 32.8*  --   PLT 467*  --   --   --  500* 449*  --   HEPARINUNFRC <0.10*  --    < > 0.25* 0.24*  --  0.42  CREATININE 1.82*  --   --   --  1.44* 1.69*  --    < > = values in this interval not displayed.    Estimated Creatinine Clearance: 45 mL/min (A) (by C-G formula based on SCr of 1.69 mg/dL (H)).   Assessment: Patient is a 74 y.o M with hx afib (not on anticoag. PTA) presented to the ED from [facility] for management of septic left hip with plan for I&D on 03/09/20.  He was found to be in afib. Pharmacy is consulted to start heparin for afib.  Significant Events: - 11/24: IR placement of drainage cath into hip abscess  - 11/29 I&D hip abscess  Today, 03/10/2020: - Heparin resumed this morning at 0530 am Heparin  level = 0.42 therapeutic) on heparin gtt @ 1550 units/hr - Hgb up to 10.7 s/p 2 U PRBC 11/29 - no bleeding reported - SCr 1.69  Goal of Therapy:  Heparin level 0.3-0.7 units/ml Monitor platelets by anticoagulation protocol: Yes   Plan:  - continue heparin drip at 1500 units/hr  - check 8 hr confirmatory heparin level - monitor for s/sx bleeding\ - f/u transition to oral agent for Afib> cards note rec [place], Pharm.D 03/10/2020 2:17 PM

## 2020-03-10 NOTE — Progress Notes (Signed)
PROGRESS NOTE    [name]  [name] DOB: 20-Jul-1945 DOA: 03/03/2020 PCP: Luetta Nutting, DO   No chief complaint on file.  Brief Narrative:  [name] is [name] 74 y.o. male with medical history significant of coronary artery disease, diabetes type 2, hypertension, anemia, CKD stage IIIb, osteoarthritis status post left hip arthroplasty, renal stone/hydronephrosis status post stents, prostate cancer ,paroxysmal atrial flutter not on anticoagulation who was sent for direct admission to [facility] from [facility],Dr Aluisio.  Patient was seen in the emergency department on 03/01/2020 for left hip pain.  There was no report of injury.  He had CT left hip done which showed  fluid collection.  He was found to have elevated ESR and CRP.  He was discharged from the emergency department to follow-up with orthopedics as per orthopedics recommendation.   In the orthopedics office, he reported chills, nausea/diarrhea and increased left hip pain.  He was hardly able to walk.  He was afebrile.  Patient was requested for direct admission by orthopedics team for drainage or intervention of the left hip fluid collection.  Lab work also showed elevated ESR and CRP. Patient seen and examined at the bedside this evening.  During my evaluation, he was hemodynamically stable and overall comfortable.  He was hypertensive.  He complained of severe pain on his left hip and the left ear was found to be tender.  He denied any fever, chest pain, shortness of breath, cough, abdomen pain, nausea, vomiting, dysuria, hematochezia or melena.  He ives alone.  Assessment & Plan:   Active Problems:   OA (osteoarthritis) of hip   Type 2 diabetes mellitus with hypoglycemia without coma (HCC)   Essential hypertension   Hyperlipidemia with target LDL less than 70   Hip pain   Effusion of hip joint, left   Abdominal distension   SBO (small bowel obstruction) (HCC)    Bacteremia  Enterobacter Cloacae Bacteremia  Emphysematous Pyelitis  Left Hip Septic Arthritis/Prosthetic Joint Infection CT left hip showed large fluid and air containing collection surroudning the proximal femoral component measuring up to 8.7 cm, concerning for infection/abscess.  Fluid and air collection track within the L adductor muscle compartment extending towards the symphysis.  Periprosthetic lucency along the posterior cortex of the proximal femoral stem extending distally by approximately 7.5 cm, concerning for septic loosening.  Area of lucency along the anterior aspect of the proximal native femur with air seen within bone, concerning for osteo. S/p CT guided placed 10 F drainage catheter placement  Culture with abundant enterobacter cloacae (resistant to cefazolin) Blood culture with 1 of 2 sets positive for enterobacter cloacae (resistant to cefazolin) Urine cx with enterobacter species (resistant to cefazolin) Repeat blood cx NG < 24 hr 11/29 Ortho s/p I&D L hip with liner exchange on 11/29 -> cx with few gram negative rods and rare gram positive cocci  ID consult, appreciate recs -> recommending transition to cefepime.  Repeat blood cx 11/29 pending.  S/p tunneled line on 11/30 by IR.  Will need 6 weeks IV abx, followed by oral abx - follow recs regarding GPC's in operative cx. urology recommending continue abx, consider nephrostomy tube if new onset fevers/decreased uop/worsening creatinine  Partial SBO vs Ileus: s/p NG tube placement for diffusely gas distended bowel Follow CT abdomen pelvis -> tiny richter hernia containing [name] very short loop of small bowel with an associated early/partial SBO. KUB 11/26 with mildly dilated small bowel loops Surgery c/s, appreciate recs -  suspects this was ileus 2/2 medical issues that appears to be resolving Worsening distension today, 11/30 KUB with progressive gaseous distention of the small bowel, c/w worsening obstruction or ileus - will  keep NPO, will ask surgery to follow back up with him  Paroxysmal [name]. fib/atrial flutter: Follows with Dr [name].  Not on any anticoagulation at home because of anemia. chadvasc at leat 3. Rate controlled  Appreciate cardiology assistance - amiodarone, continue IV heparin and cardizem per cards for now Heparin gtt  Transition to dilt gtt, low dose oral metop, IV metop prn  Follow repeat echo - EF 55-60%, no RWMA (see report)  Diabetes type 2:Hemoglobin A1c of 7.1 as per 9/21. SSI - hold home amaryl and januvia Adjust prn  AKI on CKD stage IIIb  Anion Gap Metabolic Acidosis: His kidney function has fluctuated over several months from 1.5-3, unclear baseline.  Continue to monitor kidney function.  Avoid nephrotoxins.  He needs to follow-up with nephrology as an outpatient. Creatinine up to 2.91 on 11/25, continue IVF Acidosis resolved  History of severe left hydroureteronephrosis/prostate cancer: Follows with urology.  He is status post bilateral ureteral stent placement by Dr. [name] 10/6.  Currently prostate cancer is in remission.  Normocytic anemia: Most likely associated  with chronic kidney disease, chronic medical problems.  Hemoglobin currently stable .  No report of hematochezia or melena.  Coronary artery disease: Takes aspirin.  continue to hold in preparation for procedure. Denies any chest pain.    Hypertension:  continue dilt and metop as noted above  Hyperlipidemia: Takes Lipitor.  GERD: Continue Protonix  Left hip pain/debility: We will request for PT/OT evaluation after procedure.  DVT prophylaxis: heparin Code Status: full  Family Communication: shelby at bedside Disposition:   Status is: inpatient  The patient will require care spanning > 2 midnights and should be moved to inpatient because: Inpatient level of care appropriate due to severity of illness  Dispo: The patient is from: Home              Anticipated d/c is to: pending               Anticipated d/c date is: > 3 days              Patient currently is not medically stable to d/c.   Consultants:   IR  Ortho  Procedures: IR IMPRESSION: Successful CT guided placement of Jessicia Napolitano 12 French all purpose drain catheter into the complex fluid collection involving the proximal lateral aspect the left thigh with aspiration of 50 cc of purulent fluid. Samples were sent to the laboratory as requested by the ordering clinical team.  Echo IMPRESSIONS    1. Left ventricular ejection fraction, by estimation, is 55 to 60%. The  left ventricle has normal function. The left ventricle has no regional  wall motion abnormalities. There is mild concentric left ventricular  hypertrophy. Left ventricular diastolic  function could not be evaluated.  2. Right ventricular systolic function is normal. The right ventricular  size is normal.  3. Left atrial size was mildly dilated.  4. The pericardial effusion is circumferential.  5. The mitral valve is normal in structure. Mild mitral valve  regurgitation. No evidence of mitral stenosis.  6. The aortic valve is normal in structure. Aortic valve regurgitation is  not visualized. Mild aortic valve sclerosis is present, with no evidence  of aortic valve stenosis.  7. The inferior vena cava is normal in size with greater than  50%  respiratory variability, suggesting right atrial pressure of 3 mmHg.   Comparison(s): No significant change from prior study. Prior images  reviewed side by side.  Antimicrobials: Anti-infectives (From admission, onward)   Start     Dose/Rate Route Frequency Ordered Stop   03/06/20 1700  ceFEPIme (MAXIPIME) 2 g in sodium chloride 0.9 % 100 mL IVPB        2 g 200 mL/hr over 30 Minutes Intravenous Every 12 hours 03/06/20 1340     03/05/20 1000  piperacillin-tazobactam (ZOSYN) IVPB 3.375 g  Status:  Discontinued        3.375 g 12.5 mL/hr over 240 Minutes Intravenous Every 8 hours 03/05/20 0904 03/06/20 1311       Subjective: No new complaints  Objective: Vitals:   03/10/20 0843 03/10/20 1031 03/10/20 1502 03/10/20 1715  BP:  (!) 157/81 (!) 176/71 (!) 154/80  Pulse:  95 85 (!) 101  Resp:  _0 Temp: 97.9 F (36.6 C) 98.9 F (37.2 C) 98.3 F (36.8 C) 98.1 F (36.7 C)  TempSrc: Oral Oral Oral Oral  SpO2:  99% 98%   Weight:      Height:        Intake/Output Summary (Last 24 hours) at 03/10/2020 1905 Last data filed at 03/10/2020 1849 Gross per 24 hour  Intake 2095.1 ml  Output 565 ml  Net 1530.1 ml   Filed Weights   03/03/20 1855 03/07/20 0500 03/09/20 0430  Weight: 110.3 kg 95.8 kg 101.1 kg    Examination:  General: No acute distress. Cardiovascular: irregularly irregular  Lungs: Clear to auscultation bilaterally Abdomen: Soft, nontender, distended Neurological: Alert and oriented 3. Moves all extremities 4 . Cranial nerves II through XII grossly intact. Skin: Warm and dry. No rashes or lesions. Extremities: L hip with intact dressing, drain    Data Reviewed: I have personally reviewed following labs and imaging studies  CBC: Recent Labs  Lab 03/05/20 0428 03/05/20 0428 03/06/20 0325 03/07/20 0659 03/08/20 0610 03/09/20 0419 03/10/20 0633  WBC 7.4   < > 9.9 7.9 7.3 7.6 13.3*  NEUTROABS 6.4  --  8.3* 6.4 5.8 6.0  --   HGB 10.3*   < > 9.9* 9.4* 8.8* 9.0* 10.7*  HCT 32.1*   < > 30.2* 29.2* 28.4* 28.6* 32.8*  MCV 92.8   < > 94.4 94.2 96.3 94.7 92.1  PLT 601*   < > 605* 573* 467* 500* 449*   < > = values in this interval not displayed.    Basic Metabolic Panel: Recent Labs  Lab 03/05/20 0428 03/05/20 0428 03/06/20 0325 03/07/20 0659 03/08/20 0610 03/09/20 0419 03/10/20 0633  NA 138   < > 138 140 140 138 138  K 4.6   < > 4.3 3.9 4.1 4.4 5.1  CL 105   < > 106 102 101 101 104  CO2 18*   < > 16* _1 GLUCOSE 186*   < > 191* 241* 158* 148* 194*  BUN 117*   < > 132* 131* 87* 57* 51*  CREATININE 2.91*   < > 2.70* 2.55* 1.82* 1.44* 1.69*   CALCIUM 8.6*   < > 8.4* 8.0* 8.2* 8.3* 8.1*  MG 3.4*  --  3.3* 3.1* 2.8* 2.3  --   PHOS 6.5*  --  6.3* 5.0* 3.4 2.7  --    < > = values in this interval not displayed.    GFR: Estimated Creatinine Clearance: 45 mL/min (Kathye Cipriani) (  by C-G formula based on SCr of 1.69 mg/dL (H)).  Liver Function Tests: Recent Labs  Lab 03/06/20 0325 03/07/20 0659 03/08/20 0610 03/09/20 0419 03/10/20 0633  AST _0 44* 76*  ALT _1 33 67*  ALKPHOS 143* 116 101 94 97  BILITOT 0.8 0.6 0.5 0.6 0.9  PROT 8.1 7.4 7.3 7.1 6.9  ALBUMIN 2.6* 2.4* 2.4* 2.3* 2.4*    CBG: Recent Labs  Lab 03/09/20 1800 03/09/20 2046 03/10/20 0737 03/10/20 1202 03/10/20 1708  GLUCAP 177* 178* 191* 209* 196*     Recent Results (from the past 240 hour(s))  Resp Panel by RT-PCR (Flu Onyinyechi Huante&B, Covid) Nasopharyngeal Swab     Status: None   Collection Time: 03/01/20 12:20 PM   Specimen: Nasopharyngeal Swab; Nasopharyngeal(NP) swabs in vial transport medium  Result Value Ref Range Status   SARS Coronavirus 2 by RT PCR NEGATIVE NEGATIVE Final    Comment: (NOTE) SARS-CoV-2 target nucleic acids are NOT DETECTED.  The SARS-CoV-2 RNA is generally detectable in upper respiratory specimens during the acute phase of infection. The lowest concentration of SARS-CoV-2 viral copies this assay can detect is 138 copies/mL. Kassy Mcenroe negative result does not preclude SARS-Cov-2 infection and should not be used as the sole basis for treatment or other patient management decisions. Timothey Dahlstrom negative result may occur with  improper specimen collection/handling, submission of specimen other than nasopharyngeal swab, presence of viral mutation(s) within the areas targeted by this assay, and inadequate number of viral copies(<138 copies/mL). Rehema Muffley negative result must be combined with clinical observations, patient history, and epidemiological information. The expected result is Negative.  Fact Sheet for Patients:   EntrepreneurPulse.com.au  Fact Sheet for Healthcare Providers:  IncredibleEmployment.be  This test is no t yet approved or cleared by the [place] FDA and  has been authorized for detection and/or diagnosis of SARS-CoV-2 by FDA under an Emergency Use Authorization (EUA). This EUA will remain  in effect (meaning this test can be used) for the duration of the COVID-19 declaration under Section 564(b)(1) of the Act, 21 U.S.C.section 360bbb-3(b)(1), unless the authorization is terminated  or revoked sooner.       Influenza Krisa Blattner by PCR NEGATIVE NEGATIVE Final   Influenza B by PCR NEGATIVE NEGATIVE Final    Comment: (NOTE) The Xpert Xpress SARS-CoV-2/FLU/RSV plus assay is intended as an aid in the diagnosis of influenza from Nasopharyngeal swab specimens and should not be used as Auriah Hollings sole basis for treatment. Nasal washings and aspirates are unacceptable for Xpert Xpress SARS-CoV-2/FLU/RSV testing.  Fact Sheet for Patients: EntrepreneurPulse.com.au  Fact Sheet for Healthcare Providers: IncredibleEmployment.be  This test is not yet approved or cleared by the [place] FDA and has been authorized for detection and/or diagnosis of SARS-CoV-2 by FDA under an Emergency Use Authorization (EUA). This EUA will remain in effect (meaning this test can be used) for the duration of the COVID-19 declaration under Section 564(b)(1) of the Act, 21 U.S.C. section 360bbb-3(b)(1), unless the authorization is terminated or revoked.  Performed at [facility], [place] [address] [place], [place] [address]   Culture, blood (routine x 2)     Status: Abnormal   Collection Time: 03/03/20  7:04 PM   Specimen: BLOOD LEFT HAND  Result Value Ref Range Status   Specimen Description   Final    BLOOD LEFT HAND Performed at [place] [address] [place], [place] [address]    Special  Requests   Final  BOTTLES DRAWN AEROBIC ONLY Blood Culture adequate volume Performed at [facility] [address], [place], [place] [address]    Culture  Setup Time   Final    AEROBIC BOTTLE ONLY GRAM NEGATIVE RODS CRITICAL RESULT CALLED TO, READ BACK BY AND VERIFIED WITH: Ulice Bold PharmD 9:20 03/06/20 ([email or web address]) Performed at [facility], [place] [address] [place],  [address]    Culture ENTEROBACTER CLOACAE (Heinrich Fertig)  Final   Report Status 03/08/2020 FINAL  Final   Organism ID, Bacteria ENTEROBACTER CLOACAE  Final      Susceptibility   Enterobacter cloacae - MIC*    CEFAZOLIN >=64 RESISTANT Resistant     CEFEPIME <=0.12 SENSITIVE Sensitive     CEFTAZIDIME <=1 SENSITIVE Sensitive     CIPROFLOXACIN <=0.25 SENSITIVE Sensitive     GENTAMICIN <=1 SENSITIVE Sensitive     IMIPENEM <=0.25 SENSITIVE Sensitive     TRIMETH/SULFA <=20 SENSITIVE Sensitive     PIP/TAZO <=4 SENSITIVE Sensitive     * ENTEROBACTER CLOACAE  Blood Culture ID Panel (Reflexed)     Status: Abnormal   Collection Time: 03/03/20  7:04 PM  Result Value Ref Range Status   Enterococcus faecalis NOT DETECTED NOT DETECTED Final   Enterococcus Faecium NOT DETECTED NOT DETECTED Final   Listeria monocytogenes NOT DETECTED NOT DETECTED Final   Staphylococcus species NOT DETECTED NOT DETECTED Final   Staphylococcus aureus (BCID) NOT DETECTED NOT DETECTED Final   Staphylococcus epidermidis NOT DETECTED NOT DETECTED Final   Staphylococcus lugdunensis NOT DETECTED NOT DETECTED Final   Streptococcus species NOT DETECTED NOT DETECTED Final   Streptococcus agalactiae NOT DETECTED NOT DETECTED Final   Streptococcus pneumoniae NOT DETECTED NOT DETECTED Final   Streptococcus pyogenes NOT DETECTED NOT DETECTED Final   Lenoard Helbert.calcoaceticus-baumannii NOT DETECTED NOT DETECTED Final   Bacteroides fragilis NOT DETECTED NOT DETECTED Final   Enterobacterales DETECTED (Jawon Dipiero) NOT DETECTED Final    Comment: Enterobacterales  represent Feliciana Narayan large order of gram negative bacteria, not Yarelis Ambrosino single organism. CRITICAL RESULT CALLED TO, READ BACK BY AND VERIFIED WITH: Ulice Bold PharmD 9:20 03/06/20 ([email or web address])    Enterobacter cloacae complex DETECTED (Tenesia Escudero) NOT DETECTED Final    Comment: CRITICAL RESULT CALLED TO, READ BACK BY AND VERIFIED WITH: Ulice Bold PharmD 9:20 03/06/20 ([email or web address])    Escherichia coli NOT DETECTED NOT DETECTED Final   Klebsiella aerogenes NOT DETECTED NOT DETECTED Final   Klebsiella oxytoca NOT DETECTED NOT DETECTED Final   Klebsiella pneumoniae NOT DETECTED NOT DETECTED Final   Proteus species NOT DETECTED NOT DETECTED Final   Salmonella species NOT DETECTED NOT DETECTED Final   Serratia marcescens NOT DETECTED NOT DETECTED Final   Haemophilus influenzae NOT DETECTED NOT DETECTED Final   Neisseria meningitidis NOT DETECTED NOT DETECTED Final   Pseudomonas aeruginosa NOT DETECTED NOT DETECTED Final   Stenotrophomonas maltophilia NOT DETECTED NOT DETECTED Final   Candida albicans NOT DETECTED NOT DETECTED Final   Candida auris NOT DETECTED NOT DETECTED Final   Candida glabrata NOT DETECTED NOT DETECTED Final   Candida krusei NOT DETECTED NOT DETECTED Final   Candida parapsilosis NOT DETECTED NOT DETECTED Final   Candida tropicalis NOT DETECTED NOT DETECTED Final   Cryptococcus neoformans/gattii NOT DETECTED NOT DETECTED Final   CTX-M ESBL NOT DETECTED NOT DETECTED Final   Carbapenem resistance IMP NOT DETECTED NOT DETECTED Final   Carbapenem resistance KPC NOT DETECTED NOT DETECTED Final   Carbapenem resistance NDM NOT DETECTED NOT DETECTED Final   Carbapenem resist OXA  77 LIKE NOT DETECTED NOT DETECTED Final   Carbapenem resistance VIM NOT DETECTED NOT DETECTED Final    Comment: Performed at [facility], [place] [address] [place], [place] [address]  Culture, blood (routine x 2)     Status: None   Collection Time: 03/03/20  7:06 PM   Specimen: BLOOD RIGHT HAND  Result Value Ref Range Status    Specimen Description   Final    BLOOD RIGHT HAND Performed at [facility] [address] [place], [place] [address]    Special Requests   Final    BOTTLES DRAWN AEROBIC ONLY Blood Culture adequate volume Performed at [place] [address] [place], [place] [address]    Culture   Final    NO GROWTH 5 DAYS Performed at [facility], [place] [address], [place], [place] [address]    Report Status 03/08/2020 FINAL  Final  Aerobic/Anaerobic Culture (surgical/deep wound)     Status: None   Collection Time: 03/04/20  3:38 PM   Specimen: Abscess  Result Value Ref Range Status   Specimen Description   Final    ABSCESS DRAIN LEFT LATERAL THIGH Performed at [place] [address] [place], [place] [address]    Special Requests   Final    NONE Performed at [facility] [place] [address], [place], [place] [address]    Gram Stain   Final    ABUNDANT WBC PRESENT,BOTH PMN AND MONONUCLEAR ABUNDANT GRAM NEGATIVE RODS    Culture   Final    ABUNDANT ENTEROBACTER CLOACAE NO ANAEROBES ISOLATED Performed at [facility], [place] [address] [place], [place] [address]    Report Status 03/09/2020 FINAL  Final   Organism ID, Bacteria ENTEROBACTER CLOACAE  Final      Susceptibility   Enterobacter cloacae - MIC*    CEFAZOLIN >=64 RESISTANT Resistant     CEFEPIME <=0.12 SENSITIVE Sensitive     CEFTAZIDIME <=1 SENSITIVE Sensitive     CIPROFLOXACIN <=0.25 SENSITIVE Sensitive     GENTAMICIN <=1 SENSITIVE Sensitive     IMIPENEM 0.5 SENSITIVE Sensitive     TRIMETH/SULFA <=20 SENSITIVE Sensitive     PIP/TAZO <=4 SENSITIVE Sensitive     * ABUNDANT ENTEROBACTER CLOACAE  Culture, Urine     Status: Abnormal   Collection Time: 03/05/20  8:10 AM   Specimen: Urine, Random  Result Value Ref Range Status   Specimen Description   Final    URINE, RANDOM Performed at [facility], [place] [address] [place], [place] [address]    Special Requests   Final    NONE Performed at [facility], [place] [address], [place], [place] [address]    Culture >=100,000 COLONIES/mL ENTEROBACTER SPECIES (Valisa Karpel)  Final   Report Status 03/07/2020 FINAL  Final   Organism ID, Bacteria ENTEROBACTER SPECIES (Jenavee Laguardia)  Final      Susceptibility   Enterobacter species - MIC*    CEFAZOLIN >=64 RESISTANT Resistant     CEFEPIME <=0.12 SENSITIVE Sensitive     CEFTRIAXONE <=0.25 SENSITIVE Sensitive     CIPROFLOXACIN <=0.25 SENSITIVE Sensitive     GENTAMICIN <=1 SENSITIVE Sensitive     IMIPENEM 0.5 SENSITIVE Sensitive     NITROFURANTOIN 32 SENSITIVE Sensitive     TRIMETH/SULFA <=20 SENSITIVE Sensitive     PIP/TAZO <=4 SENSITIVE Sensitive     * >=100,000 COLONIES/mL ENTEROBACTER SPECIES  Culture, blood (Routine X 2)  w Reflex to ID Panel     Status: None (Preliminary result)   Collection Time: 03/09/20 12:50 PM   Specimen: BLOOD  Result Value Ref Range Status   Specimen Description   Final    BLOOD RIGHT WRIST Performed at [place] [address] [place], [place] [address]    Special Requests   Final    BOTTLES DRAWN AEROBIC ONLY Blood Culture adequate volume Performed at [place] [address] [place], [place] [address]    Culture   Final    NO GROWTH < 24 HOURS Performed at [facility] [address] [place], [place] [address]    Report Status PENDING  Incomplete  Culture, blood (Routine X 2) w Reflex to ID Panel     Status: None (Preliminary result)   Collection Time: 03/09/20 12:50 PM   Specimen: BLOOD RIGHT HAND  Result Value Ref Range Status   Specimen Description   Final    BLOOD RIGHT HAND Performed at [place] [address] [place], [place] [address]    Special Requests   Final    BOTTLES DRAWN AEROBIC AND ANAEROBIC Blood Culture adequate volume Performed at [place]  [address] [place], [place] [address]    Culture   Final    NO GROWTH < 24 HOURS Performed at [facility] [address] [place], [place] [address]    Report Status PENDING  Incomplete  Aerobic/Anaerobic Culture (surgical/deep wound)     Status: None (Preliminary result)   Collection Time: 03/09/20  3:53 PM   Specimen: PATH Other; Tissue  Result Value Ref Range Status   Specimen Description   Final    ABSCESS RIGHT HIP Performed at [place] [address], [place], [place] [address]    Special Requests   Final    NONE Performed at [facility], [place] [address], [place], [place] [address]    Gram Stain   Final    FEW WBC PRESENT,BOTH PMN AND MONONUCLEAR FEW GRAM NEGATIVE RODS RARE GRAM POSITIVE COCCI    Culture   Final    TOO YOUNG TO READ Performed at [facility], [place] [address] [place], [place] [address]    Report Status PENDING  Incomplete         Radiology Studies: DG Abd 1 View  Result Date: 03/10/2020 CLINICAL DATA:  Abdominal distension EXAM: ABDOMEN - 1 VIEW COMPARISON:  03/06/2020 FINDINGS: Two supine frontal views of the abdomen and pelvis are obtained. Enteric catheter is seen previously has been removed in the interim. There is diffuse gaseous distention of the small bowel, with increase in the size and number of distended loops since prior study. Minimal colonic gas is visualized. No masses or abnormal calcifications. Bilateral ureteral stents are noted. IMPRESSION: 1. Progressive gaseous distention of the small bowel, consistent with worsening obstruction or ileus. Electronically Signed   By: Randa Ngo M.D.   On: 03/10/2020 15:07   IR Fluoro Guide CV Line Right  Result Date: 03/10/2020 INDICATION: 74 year old male referred for tunneled central venous catheter EXAM: IMAGE GUIDED TUNNELED CENTRAL VENOUS CATHETER PLACEMENT MEDICATIONS: None. ANESTHESIA/SEDATION: None FLUOROSCOPY TIME:  Fluoroscopy  Time: 0 minutes 6 seconds (3 mGy). COMPLICATIONS: None PROCEDURE: After written informed consent was obtained, patient was placed in the supine position on angiographic table. Patency of the right internal jugular vein was confirmed with ultrasound with image documentation.  Patient was prepped and draped in the usual sterile fashion including the right neck and right superior chest. Using ultrasound guidance, the skin and subcutaneous tissues overlying the right internal jugular vein were generously infiltrated with 1% lidocaine without epinephrine. Using ultrasound guidance, the right internal jugular vein was punctured with Tyeson Tanimoto micropuncture needle, and an 018 wire was advanced into the right heart confirming venous access. Irbin Fines small stab incision was made with an 11 blade scalpel. Peel-away sheath was placed over the wire, and then the wire was removed, marking the wire for estimation of internal catheter length. The chest wall was then generously infiltrated with 1% lidocaine for local anesthesia along the tissue tract. Small stab incision was made with 11 blade scalpel, and then the catheter was back tunneled to the puncture site at the right internal jugular vein. Double-lumen catheter was pulled through the tract, with the catheter amputated at 22 cm. Catheter was advanced through the peel-away sheath, and the peel-away sheath was removed. Final image was stored. The catheter was anchored to the chest wall with 2 retention sutures, and Derma bond was used to seal the right internal jugular vein incision site and at the right chest wall. Patient tolerated the procedure well and remained hemodynamically stable throughout. No complications were encountered and no significant blood loss was encountered. FINDINGS: Tip at the cavoatrial junction IMPRESSION: Status post image guided placement of tunneled right IJ cuffed central venous catheter. Signed, Dulcy Fanny. Dellia Nims, RPVI Vascular and Interventional Radiology  Specialists [facility] Electronically Signed   By: Corrie Mckusick D.O.   On: 03/10/2020 17:28   IR US Guide Vasc Access Right  Result Date: 03/10/2020 INDICATION: 74 year old male referred for tunneled central venous catheter EXAM: IMAGE GUIDED TUNNELED CENTRAL VENOUS CATHETER PLACEMENT MEDICATIONS: None. ANESTHESIA/SEDATION: None FLUOROSCOPY TIME:  Fluoroscopy Time: 0 minutes 6 seconds (3 mGy). COMPLICATIONS: None PROCEDURE: After written informed consent was obtained, patient was placed in the supine position on angiographic table. Patency of the right internal jugular vein was confirmed with ultrasound with image documentation. Patient was prepped and draped in the usual sterile fashion including the right neck and right superior chest. Using ultrasound guidance, the skin and subcutaneous tissues overlying the right internal jugular vein were generously infiltrated with 1% lidocaine without epinephrine. Using ultrasound guidance, the right internal jugular vein was punctured with Janann Boeve micropuncture needle, and an 018 wire was advanced into the right heart confirming venous access. Aluna Whiston small stab incision was made with an 11 blade scalpel. Peel-away sheath was placed over the wire, and then the wire was removed, marking the wire for estimation of internal catheter length. The chest wall was then generously infiltrated with 1% lidocaine for local anesthesia along the tissue tract. Small stab incision was made with 11 blade scalpel, and then the catheter was back tunneled to the puncture site at the right internal jugular vein. Double-lumen catheter was pulled through the tract, with the catheter amputated at 22 cm. Catheter was advanced through the peel-away sheath, and the peel-away sheath was removed. Final image was stored. The catheter was anchored to the chest wall with 2 retention sutures, and Derma bond was used to seal the right internal jugular vein incision site and at the right chest wall.  Patient tolerated the procedure well and remained hemodynamically stable throughout. No complications were encountered and no significant blood loss was encountered. FINDINGS: Tip at the cavoatrial junction IMPRESSION: Status post image guided placement of tunneled right IJ cuffed central venous catheter.  Signed, Dulcy Fanny. Dellia Nims, RPVI Vascular and Interventional Radiology Specialists [facility] Radiology Electronically Signed   By: Corrie Mckusick D.O.   On: 03/10/2020 17:28        Scheduled Meds: . amiodarone  200 mg Oral Daily  . atorvastatin  40 mg Oral Daily  . bisacodyl  10 mg Rectal Daily  . Chlorhexidine Gluconate Cloth  6 each Topical Daily  . docusate sodium  100 mg Oral BID  . ferrous sulfate  325 mg Oral Q breakfast  . gabapentin  300 mg Oral TID  . insulin aspart  0-15 Units Subcutaneous TID WC  . insulin aspart  0-5 Units Subcutaneous QHS  . lip balm  1 application Topical BID  . metoprolol tartrate  12.5 mg Oral BID  . pantoprazole  40 mg Oral Daily  . polyethylene glycol  17 g Oral BID  . sodium chloride flush  5 mL Intracatheter Q8H   Continuous Infusions: . sodium chloride 75 mL/hr at 03/10/20 1745  . ceFEPime (MAXIPIME) IV 2 g (03/10/20 1829)  . diltiazem (CARDIZEM) infusion 15 mg/hr (03/10/20 1829)  . heparin 1,500 Units/hr (03/10/20 1745)  . methocarbamol (ROBAXIN) IV       LOS: 6 days    Time spent: over 30 min    Fayrene Helper, MD [facility]   To contact the attending provider between 7A-7P or the covering provider during after hours 7P-7A, please log into the web site [email or web address] and access using universal [place] password for that web site. If you do not have the password, please call the hospital operator.  03/10/2020, 7:05 PM

## 2020-03-10 NOTE — Evaluation (Signed)
Physical Therapy Evaluation Patient Details Name: [name] MRN: [id number] DOB: 1946-01-20 Today's Date: 03/10/2020   History of Present Illness  74 yo male admitted to ED from [facility] with L hip pain and fluid collection. S/P I&D and bearing surface exchange 03/09/20. Hx of R THA 2013, L THA 2001, DM, CAD, CKD, anemia  Clinical Impression  On eval, pt required Mod assist for mobility. He was able to stand and take a few steps in the room with a RW. Moderate pain with activity. HR up to 150 bpm and dyspnea 3/4 with activity. Reviewed hip precautions with pt and nursing staff. At this time, recommendation is for ST SNF rehab. Pt lives alone and currently requires a considerable amount of assistance to safely mobilize. Will continue to follow and progress activity as tolerated. If pt declines placement, will likely need to maximize home health services.     Follow Up Recommendations SNF    Equipment Recommendations  None recommended by PT    Recommendations for Other Services OT consult     Precautions / Restrictions Precautions Precautions: Fall;Posterior Hip Precaution Comments: Reviewed hip precautions Restrictions Weight Bearing Restrictions: No Other Position/Activity Restrictions: WBAT      Mobility  Bed Mobility Overal bed mobility: Needs Assistance Bed Mobility: Supine to Sit     Supine to sit: Mod assist;HOB elevated     General bed mobility comments: Assist for trunk and L LE. Utilized bedpad to aid with scooting, positioning. Cues for safety, adherence to precautions. Increased time and effort for pt.    Transfers Overall transfer level: Needs assistance Equipment used: Rolling walker (2 wheeled) Transfers: Sit to/from [facility] Sit to Stand: From elevated surface;Mod assist         General transfer comment: Assist to power up, stabilize, control descent. Cues for safety, technique, hand/LE placement. Increased time.Stand pivot  to recliner using RW  Ambulation/Gait Ambulation/Gait assistance: Min assist Gait Distance (Feet): 3 Feet Assistive device: Rolling walker (2 wheeled)       General Gait Details: VCs safety, sequence. Pt took a few steps but HR increased to 150 bpm so deferred further ambulation and assisted pt into recliner.  Stairs            Wheelchair Mobility    Modified Rankin (Stroke Patients Only)       Balance Overall balance assessment: Needs assistance         Standing balance support: Bilateral upper extremity supported Standing balance-Leahy Scale: Poor                               Pertinent Vitals/Pain Pain Assessment: Faces Faces Pain Scale: Hurts even more Pain Location: L hip Pain Descriptors / Indicators: Discomfort;Sore;Aching Pain Intervention(s): Limited activity within patient's tolerance;Monitored during session;Repositioned    Home Living Family/patient expects to be discharged to:: Private residence Living Arrangements: Alone Available Help at Discharge: Family;Friend(s) Type of Home: House Home Access: Stairs to enter Entrance Stairs-Rails: None Entrance Stairs-Number of Steps: 1 Home Layout: Bed/bath upstairs;Two level Home Equipment: Walker - 2 wheels      Prior Function Level of Independence: Independent with assistive device(s)         Comments: using RW     Hand Dominance        Extremity/Trunk Assessment   Upper Extremity Assessment Upper Extremity Assessment: Defer to OT evaluation    Lower Extremity Assessment Lower Extremity Assessment: Generalized weakness  Cervical / Trunk Assessment Cervical / Trunk Assessment: Normal  Communication   Communication: No difficulties  Cognition Arousal/Alertness: Awake/alert Behavior During Therapy: WFL for tasks assessed/performed Overall Cognitive Status: Within Functional Limits for tasks assessed                                        General  Comments      Exercises     Assessment/Plan    PT Assessment Patient needs continued PT services  PT Problem List Decreased strength;Decreased range of motion;Decreased mobility;Decreased activity tolerance;Decreased balance;Decreased knowledge of use of DME;Pain;Decreased knowledge of precautions       PT Treatment Interventions DME instruction;Gait training;Therapeutic activities;Therapeutic exercise;Patient/family education;Balance training;Functional mobility training    PT Goals (Current goals can be found in the Care Plan section)  Acute Rehab PT Goals Patient Stated Goal: home. regain independence PT Goal Formulation: With patient Time For Goal Achievement: 03/18/20 Potential to Achieve Goals: Good    Frequency Min 5X/week   Barriers to discharge Decreased caregiver support      Co-evaluation               AM-PAC PT "6 Clicks" Mobility  Outcome Measure Help needed turning from your back to your side while in a flat bed without using bedrails?: A Lot Help needed moving from lying on your back to sitting on the side of a flat bed without using bedrails?: A Lot Help needed moving to and from a bed to a chair (including a wheelchair)?: A Lot Help needed standing up from a chair using your arms (e.g., wheelchair or bedside chair)?: A Lot Help needed to walk in hospital room?: A Lot Help needed climbing 3-5 steps with a railing? : A Lot 6 Click Score: 12    End of Session Equipment Utilized During Treatment: Gait belt Activity Tolerance: Patient tolerated treatment well Patient left: in chair;with call bell/phone within reach   PT Visit Diagnosis: Muscle weakness (generalized) (M62.81);Other abnormalities of gait and mobility (R26.89);Pain Pain - Right/Left: Left Pain - part of body: Hip    Time: 5093-2671 PT Time Calculation (min) (ACUTE ONLY): 32 min   Charges:   PT Evaluation $PT Eval Moderate Complexity: 1 Mod PT Treatments $Gait Training: 8-22  mins       Doreatha Massed, PT Acute Rehabilitation  Office: [phone] Pager: [phone]

## 2020-03-10 NOTE — TOC Progression Note (Signed)
Transition of Care [facility]) - Progression Note    Patient Details  Name: [name] MRN: [id number] Date of Birth: 09-25-1945  [facility]) CM/SW Contact  Purcell Mouton, RN Phone Number: 03/10/2020, 11:25 AM  Clinical Narrative:    Pt will discharge home with IV ABX/Pam, RN with [facility] following.    Expected Discharge Plan: [place] Barriers to Discharge: No Barriers Identified  Expected Discharge Plan and Services Expected Discharge Plan: [place]   Discharge Planning Services: CM Consult   Living arrangements for the past 2 months: Single Family Home                                       Social Determinants of Health (SDOH) Interventions    Readmission Risk Interventions No flowsheet data found.

## 2020-03-11 ENCOUNTER — Inpatient Hospital Stay (HOSPITAL_COMMUNITY): Payer: HMO

## 2020-03-11 DIAGNOSIS — M25452 Effusion, left hip: Secondary | ICD-10-CM | POA: Diagnosis not present

## 2020-03-11 DIAGNOSIS — E11649 Type 2 diabetes mellitus with hypoglycemia without coma: Secondary | ICD-10-CM

## 2020-03-11 DIAGNOSIS — R7881 Bacteremia: Secondary | ICD-10-CM | POA: Diagnosis not present

## 2020-03-11 DIAGNOSIS — N3000 Acute cystitis without hematuria: Secondary | ICD-10-CM

## 2020-03-11 DIAGNOSIS — Z96649 Presence of unspecified artificial hip joint: Secondary | ICD-10-CM

## 2020-03-11 DIAGNOSIS — T8459XD Infection and inflammatory reaction due to other internal joint prosthesis, subsequent encounter: Secondary | ICD-10-CM | POA: Diagnosis not present

## 2020-03-11 LAB — CBC
HCT: 28.3 % — ABNORMAL LOW (ref 39.0–52.0)
Hemoglobin: 8.9 g/dL — ABNORMAL LOW (ref 13.0–17.0)
MCH: 29.8 pg (ref 26.0–34.0)
MCHC: 31.4 g/dL (ref 30.0–36.0)
MCV: 94.6 fL (ref 80.0–100.0)
Platelets: 399 10*3/uL (ref 150–400)
RBC: 2.99 MIL/uL — ABNORMAL LOW (ref 4.22–5.81)
RDW: 15.9 % — ABNORMAL HIGH (ref 11.5–15.5)
WBC: 11.9 10*3/uL — ABNORMAL HIGH (ref 4.0–10.5)
nRBC: 0 % (ref 0.0–0.2)

## 2020-03-11 LAB — BASIC METABOLIC PANEL
Anion gap: 9 (ref 5–15)
BUN: 58 mg/dL — ABNORMAL HIGH (ref 8–23)
CO2: 22 mmol/L (ref 22–32)
Calcium: 7.8 mg/dL — ABNORMAL LOW (ref 8.9–10.3)
Chloride: 105 mmol/L (ref 98–111)
Creatinine, Ser: 1.84 mg/dL — ABNORMAL HIGH (ref 0.61–1.24)
GFR, Estimated: 38 mL/min — ABNORMAL LOW (ref >60)
Glucose, Bld: 223 mg/dL — ABNORMAL HIGH (ref 70–99)
Potassium: 5.2 mmol/L — ABNORMAL HIGH (ref 3.5–5.1)
Sodium: 136 mmol/L (ref 135–145)

## 2020-03-11 LAB — HEPARIN LEVEL (UNFRACTIONATED): Heparin Unfractionated: 0.5 IU/mL (ref 0.30–0.70)

## 2020-03-11 LAB — GLUCOSE, CAPILLARY
Glucose-Capillary: 187 mg/dL — ABNORMAL HIGH (ref 70–99)
Glucose-Capillary: 167 mg/dL — ABNORMAL HIGH (ref 70–99)
Glucose-Capillary: 172 mg/dL — ABNORMAL HIGH (ref 70–99)
Glucose-Capillary: 174 mg/dL — ABNORMAL HIGH (ref 70–99)

## 2020-03-11 MED ORDER — SIMETHICONE 80 MG PO CHEW
80.0000 mg | CHEWABLE_TABLET | Freq: Four times a day (QID) | ORAL | Status: DC
  Administered 2020-03-11 – 2020-03-20 (×36): 80 mg via ORAL
  Filled 2020-03-11 (×36): qty 1

## 2020-03-11 MED ORDER — METOPROLOL TARTRATE 25 MG PO TABS
25.0000 mg | ORAL_TABLET | Freq: Two times a day (BID) | ORAL | Status: DC
  Administered 2020-03-11 – 2020-03-20 (×19): 25 mg via ORAL
  Filled 2020-03-11 (×19): qty 1

## 2020-03-11 MED ORDER — SIMETHICONE 80 MG PO CHEW: 80.0000 mg | CHEWABLE_TABLET | Freq: Four times a day (QID) | ORAL | Status: DC

## 2020-03-11 MED ORDER — PSYLLIUM 95 % PO PACK
1.0000 | PACK | Freq: Every day | ORAL | Status: DC
  Administered 2020-03-11 – 2020-03-12 (×2): 1 via ORAL
  Filled 2020-03-11 (×3): qty 1

## 2020-03-11 MED ORDER — SODIUM CHLORIDE 0.9 % IV SOLN: INTRAVENOUS | Status: DC

## 2020-03-11 MED ORDER — SODIUM CHLORIDE 0.9% FLUSH
10.0000 mL | Freq: Two times a day (BID) | INTRAVENOUS | Status: DC
  Administered 2020-03-12 – 2020-03-19 (×6): 10 mL

## 2020-03-11 MED ORDER — SODIUM CHLORIDE 0.9% FLUSH
10.0000 mL | INTRAVENOUS | Status: DC | PRN
  Administered 2020-03-19: 10 mL
  Administered 2020-03-20: 20 mL

## 2020-03-11 NOTE — Progress Notes (Signed)
Anselmo for heparin Indication: atrial fibrillation  Allergies  Allergen Reactions  . Shellfish Allergy Rash    Patient Measurements: Height: 5\' 9"  (175.3 cm) Weight: 101.1 kg (222 lb 14.2 oz) IBW/kg (Calculated) : 70.7 Heparin Dosing Weight: 92 kg  Vital Signs: Temp: 98.1 F (36.7 C) (11/30 2100) Temp Source: Oral (11/30 2100) BP: 154/80 (11/30 1715) Pulse Rate: 101 (11/30 1715)  Labs: Recent Labs    03/08/20 0610 03/08/20 0829 03/09/20 0419 03/09/20 0419 03/10/20 0633 03/10/20 1319 03/10/20 2113 03/11/20 0143  HGB 8.8*   < > 9.0*   < > 10.7*  --   --  8.9*  HCT 28.4*   < > 28.6*  --  32.8*  --   --  28.3*  PLT 467*   < > 500*  --  449*  --   --  399  HEPARINUNFRC <0.10*   < > 0.24*   < >  --  0.42 0.35 0.50  CREATININE 1.82*  --  1.44*  --  1.69*  --   --   --    < > = values in this interval not displayed.    Estimated Creatinine Clearance: 45 mL/min (A) (by C-G formula based on SCr of 1.69 mg/dL (H)).   Assessment: Patient is a 74 y.o M with hx afib (not on anticoag. PTA) presented to the ED from [facility] for management of septic left hip with plan for I&D on 03/09/20.  He was found to be in afib. Pharmacy is consulted to start heparin for afib.  Significant Events: - 11/24: IR placement of drainage cath into hip abscess  - 11/29 I&D hip abscess  - Heparin resumed 11/30 at 0530 am  Today, 03/11/2020: - HL 0.5, therapeutic on 1500 units/hr - Hgb down to 8.9 - Plts WNL - Scr up 1.84 - no bleeding noted  Goal of Therapy:  Heparin level 0.3-0.7 units/ml Monitor platelets by anticoagulation protocol: Yes   Plan:  - continue heparin drip at 1500 units/hr  - daily heparin level and CBC - monitor for s/sx bleeding - f/u transition to oral agent for Afib> cards note rec Eliquis  Dolly Rias RPh 03/11/2020, 2:19 AM

## 2020-03-11 NOTE — Evaluation (Signed)
Occupational Therapy Evaluation Patient Details Name: [name] MRN: [id number] DOB: 07-Jul-1945 Today's Date: 03/11/2020    History of Present Illness 74 yo male admitted to ED from [facility] with L hip pain and fluid collection. S/P I&D and bearing surface exchange 03/09/20. Hx of R THA 2013, L THA 2001, DM, CAD, CKD, anemia   Clinical Impression   [name] is a 74 year old man s/p L hip I & D and surface exchange who presents with decreased ROM and strength of LLE, posterior hip precautions, decreased activity tolerance and balance, generalized weakness and complaints of pain resulting in a decline in independence with functional mobility and ADLs. Patient requiring max assist for LB ADLs and toileting, mod assist for supine to sit and min assist for standing and transfer to recliner. Patient will benefit from skilled OT services while in hospital to improve deficits and learn compensatory strategies as needed in order to improve independence. Recommend short term rehab at discharge.   Follow Up Recommendations  SNF    Equipment Recommendations  3 in 1 bedside commode;Other (comment) (AE)    Recommendations for Other Services       Precautions / Restrictions Precautions Precautions: Fall;Posterior Hip Precaution Comments: Reviewed hip precautions Restrictions Weight Bearing Restrictions: No Other Position/Activity Restrictions: WBAT      Mobility Bed Mobility Overal bed mobility: Needs Assistance Bed Mobility: Supine to Sit     Supine to sit: Mod assist;HOB elevated     General bed mobility comments: Assist for trunk and LLE. Bed pad used to pivot patient's hips.    Transfers Overall transfer level: Needs assistance Equipment used: Rolling walker (2 wheeled) Transfers: Sit to/from [facility] Sit to Stand: Min assist;From elevated surface Stand pivot transfers: Min assist       General transfer comment: Min assist to stand from  elevated bed. Use of RW to take steps to recliner. verbal cues for hand positioning and body positioning to maintain hip precautions with both sit to stand and stand to sit    Balance Overall balance assessment: Needs assistance Sitting-balance support: No upper extremity supported;Feet supported Sitting balance-Leahy Scale: Good     Standing balance support: Bilateral upper extremity supported;During functional activity Standing balance-Leahy Scale: Poor                             ADL either performed or assessed with clinical judgement   ADL Overall ADL's : Needs assistance/impaired Eating/Feeding: Independent   Grooming: Set up;Sitting   Upper Body Bathing: Set up;Sitting   Lower Body Bathing: Set up;Sit to/from stand;Maximal assistance   Upper Body Dressing : Set up;Sitting   Lower Body Dressing: Set up;Adhering to hip precautions;Maximal assistance   Toilet Transfer: Minimal assistance;BSC;Stand-pivot   Toileting- Clothing Manipulation and Hygiene: Maximal assistance;Sit to/from stand       Functional mobility during ADLs: Minimal assistance;Rolling walker       Vision Patient Visual Report: No change from baseline       Perception     Praxis      Pertinent Vitals/Pain Pain Assessment: Faces Faces Pain Scale: Hurts a little bit Pain Location: L hip Pain Descriptors / Indicators: Sore Pain Intervention(s): Monitored during session     Hand Dominance     Extremity/Trunk Assessment Upper Extremity Assessment Upper Extremity Assessment: Overall WFL for tasks assessed   Lower Extremity Assessment Lower Extremity Assessment: Defer to PT evaluation   Cervical /  Trunk Assessment Cervical / Trunk Assessment: Normal   Communication Communication Communication: No difficulties   Cognition Arousal/Alertness: Awake/alert Behavior During Therapy: WFL for tasks assessed/performed Overall Cognitive Status: Within Functional Limits for tasks  assessed                                     General Comments       Exercises     Shoulder Instructions      Home Living Family/patient expects to be discharged to:: Private residence Living Arrangements: Alone Available Help at Discharge: Family;Friend(s) Type of Home: House Home Access: Stairs to enter [employer] of Steps: 1 Entrance Stairs-Rails: None Home Layout: Bed/bath upstairs;Two level Alternate Level Stairs-Number of Steps: 1 flight   Bathroom Shower/Tub: Occupational psychologist: Standard     Home Equipment: Environmental consultant - 2 wheels          Prior Functioning/Environment Level of Independence: Independent with assistive device(s)        Comments: Use of cane PRN        OT Problem List: Decreased strength;Decreased range of motion;Decreased activity tolerance;Impaired balance (sitting and/or standing);Decreased knowledge of use of DME or AE;Decreased knowledge of precautions;Pain      OT Treatment/Interventions: Self-care/ADL training;Therapeutic exercise;DME and/or AE instruction;Therapeutic activities;Balance training;Patient/family education    OT Goals(Current goals can be found in the care plan section) Acute Rehab OT Goals Patient Stated Goal: regain independence OT Goal Formulation: With patient Time For Goal Achievement: 03/25/20 Potential to Achieve Goals: Good  OT Frequency: Min 2X/week   Barriers to D/C:            Co-evaluation              AM-PAC OT "6 Clicks" Daily Activity     Outcome Measure Help from another person eating meals?: None Help from another person taking care of personal grooming?: A Little Help from another person toileting, which includes using toliet, bedpan, or urinal?: A Lot Help from another person bathing (including washing, rinsing, drying)?: A Lot Help from another person to put on and taking off regular upper body clothing?: A Little Help from another person to put on  and taking off regular lower body clothing?: A Lot 6 Click Score: 16   End of Session Equipment Utilized During Treatment: Gait belt;Rolling walker Nurse Communication: Mobility status  Activity Tolerance: Patient tolerated treatment well Patient left: in chair;with call bell/phone within reach;with chair alarm set  OT Visit Diagnosis: Other abnormalities of gait and mobility (R26.89);Muscle weakness (generalized) (M62.81);Pain;Unsteadiness on feet (R26.81) Pain - Right/Left: Left Pain - part of body: Hip                Time: 4174-0814 OT Time Calculation (min): 23 min Charges:  OT General Charges $OT Visit: 1 Visit OT Evaluation $OT Eval Moderate Complexity: 1 Mod  Clarity Ciszek, OTR/L [place] [facility]  Office [phone] Pager: [phone]   Lenward Chancellor 03/11/2020, 4:54 PM

## 2020-03-11 NOTE — Progress Notes (Signed)
2 Days Post-Op  Subjective: Patient known to our service this admission for what was felt to be an ileus that was improving.  However, he returned to the OR on Monday with ortho and was noted to have some increasing abdominal distention yesterday.  He was drinking CLD and tolerating this except for his cranberry juice.  He denies N/V.  He denies abdominal pain.  He is passing flatus and had a BM yesterday.  He has films that reveal dilated small bowel c/w psbo or ileus.  We have been asked to re-evaluate him.  ROS: See above, otherwise other systems negative  Objective: Vital signs in last 24 hours: Temp:  [97.6 F (36.4 C)-98.3 F (36.8 C)] 97.6 F (36.4 C) (12/01 0528) Pulse Rate:  [81-101] 81 (12/01 0528) Resp:  [14-17] 16 (12/01 0528) BP: (135-176)/(71-80) 135/74 (12/01 0528) SpO2:  [98 %-99 %] 99 % (12/01 0528) Weight:  [104.3 kg] 104.3 kg (12/01 0528) Last BM Date: 03/10/20  Intake/Output from previous day: 11/30 0701 - 12/01 0700 In: 3145.1 [P.O.:360; I.V.:2685.1; IV Piggyback:100] Out: 265 [Urine:75; Drains:190] Intake/Output this shift: Total I/O In: 0  Out: 550 [Urine:550]  PE: Heart: regular, few ectopic beats Lungs: CTAB Abd: soft, distended, completely nontender, +BS  Lab Results:  Recent Labs    03/10/20 0633 03/11/20 0143  WBC 13.3* 11.9*  HGB 10.7* 8.9*  HCT 32.8* 28.3*  PLT 449* 399   BMET Recent Labs    03/10/20 0633 03/11/20 0143  NA 138 136  K 5.1 5.2*  CL 104 105  CO2 22 22  GLUCOSE 194* 223*  BUN 51* 58*  CREATININE 1.69* 1.84*  CALCIUM 8.1* 7.8*   PT/INR No results for input(s): LABPROT, INR in the last 72 hours. CMP     Component Value Date/Time   NA 136 03/11/2020 0143   NA 138 10/01/2019 0803   K 5.2 (H) 03/11/2020 0143   CL 105 03/11/2020 0143   CO2 22 03/11/2020 0143   GLUCOSE 223 (H) 03/11/2020 0143   BUN 58 (H) 03/11/2020 0143   BUN 27 (A) 10/01/2019 0803   CREATININE 1.84 (H) 03/11/2020 0143   CREATININE 1.63  (H) 01/21/2020 0953   CALCIUM 7.8 (L) 03/11/2020 0143   PROT 6.9 03/10/2020 0633   ALBUMIN 2.4 (L) 03/10/2020 0633   AST 76 (H) 03/10/2020 0633   ALT 67 (H) 03/10/2020 0633   ALKPHOS 97 03/10/2020 0633   BILITOT 0.9 03/10/2020 0633   GFRNONAA 38 (L) 03/11/2020 0143   GFRNONAA 17 (L) 01/13/2020 0716   GFRAA 20 (L) 01/13/2020 0716   Lipase     Component Value Date/Time   LIPASE 21 07/25/2013 0844       Studies/Results: DG Abd 1 View  Result Date: 03/11/2020 CLINICAL DATA:  Postoperative abdominal distension. EXAM: ABDOMEN - 1 VIEW COMPARISON:  March 10, 2020. FINDINGS: Stable position bilateral ureteral stents. No abnormal calcifications are noted. Slightly improved small bowel dilatation is noted consistent with ileus or possibly small bowel obstruction. No colonic dilatation is noted. IMPRESSION: Slightly improved small bowel dilatation is noted consistent with ileus or possibly small bowel obstruction. Stable position bilateral ureteral stents. Electronically Signed   By: Marijo Conception M.D.   On: 03/11/2020 08:31   DG Abd 1 View  Result Date: 03/10/2020 CLINICAL DATA:  Abdominal distension EXAM: ABDOMEN - 1 VIEW COMPARISON:  03/06/2020 FINDINGS: Two supine frontal views of the abdomen and pelvis are obtained. Enteric catheter is seen previously has been  removed in the interim. There is diffuse gaseous distention of the small bowel, with increase in the size and number of distended loops since prior study. Minimal colonic gas is visualized. No masses or abnormal calcifications. Bilateral ureteral stents are noted. IMPRESSION: 1. Progressive gaseous distention of the small bowel, consistent with worsening obstruction or ileus. Electronically Signed   By: Randa Ngo M.D.   On: 03/10/2020 15:07   IR Fluoro Guide CV Line Right  Result Date: 03/10/2020 INDICATION: 74 year old male referred for tunneled central venous catheter EXAM: IMAGE GUIDED TUNNELED CENTRAL VENOUS CATHETER  PLACEMENT MEDICATIONS: None. ANESTHESIA/SEDATION: None FLUOROSCOPY TIME:  Fluoroscopy Time: 0 minutes 6 seconds (3 mGy). COMPLICATIONS: None PROCEDURE: After written informed consent was obtained, patient was placed in the supine position on angiographic table. Patency of the right internal jugular vein was confirmed with ultrasound with image documentation. Patient was prepped and draped in the usual sterile fashion including the right neck and right superior chest. Using ultrasound guidance, the skin and subcutaneous tissues overlying the right internal jugular vein were generously infiltrated with 1% lidocaine without epinephrine. Using ultrasound guidance, the right internal jugular vein was punctured with a micropuncture needle, and an 018 wire was advanced into the right heart confirming venous access. A small stab incision was made with an 11 blade scalpel. Peel-away sheath was placed over the wire, and then the wire was removed, marking the wire for estimation of internal catheter length. The chest wall was then generously infiltrated with 1% lidocaine for local anesthesia along the tissue tract. Small stab incision was made with 11 blade scalpel, and then the catheter was back tunneled to the puncture site at the right internal jugular vein. Double-lumen catheter was pulled through the tract, with the catheter amputated at 22 cm. Catheter was advanced through the peel-away sheath, and the peel-away sheath was removed. Final image was stored. The catheter was anchored to the chest wall with 2 retention sutures, and Derma bond was used to seal the right internal jugular vein incision site and at the right chest wall. Patient tolerated the procedure well and remained hemodynamically stable throughout. No complications were encountered and no significant blood loss was encountered. FINDINGS: Tip at the cavoatrial junction IMPRESSION: Status post image guided placement of tunneled right IJ cuffed central venous  catheter. Signed, Dulcy Fanny. Dellia Nims, RPVI [facility] Radiology Electronically Signed   By: Corrie Mckusick D.O.   On: 03/10/2020 17:28   IR US Guide Vasc Access Right  Result Date: 03/10/2020 INDICATION: 74 year old male referred for tunneled central venous catheter EXAM: IMAGE GUIDED TUNNELED CENTRAL VENOUS CATHETER PLACEMENT MEDICATIONS: None. ANESTHESIA/SEDATION: None FLUOROSCOPY TIME:  Fluoroscopy Time: 0 minutes 6 seconds (3 mGy). COMPLICATIONS: None PROCEDURE: After written informed consent was obtained, patient was placed in the supine position on angiographic table. Patency of the right internal jugular vein was confirmed with ultrasound with image documentation. Patient was prepped and draped in the usual sterile fashion including the right neck and right superior chest. Using ultrasound guidance, the skin and subcutaneous tissues overlying the right internal jugular vein were generously infiltrated with 1% lidocaine without epinephrine. Using ultrasound guidance, the right internal jugular vein was punctured with a micropuncture needle, and an 018 wire was advanced into the right heart confirming venous access. A small stab incision was made with an 11 blade scalpel. Peel-away sheath was placed over the wire, and then the wire was removed, marking the wire for estimation of internal catheter length.  The chest wall was then generously infiltrated with 1% lidocaine for local anesthesia along the tissue tract. Small stab incision was made with 11 blade scalpel, and then the catheter was back tunneled to the puncture site at the right internal jugular vein. Double-lumen catheter was pulled through the tract, with the catheter amputated at 22 cm. Catheter was advanced through the peel-away sheath, and the peel-away sheath was removed. Final image was stored. The catheter was anchored to the chest wall with 2 retention sutures, and Derma bond was used to  seal the right internal jugular vein incision site and at the right chest wall. Patient tolerated the procedure well and remained hemodynamically stable throughout. No complications were encountered and no significant blood loss was encountered. FINDINGS: Tip at the cavoatrial junction IMPRESSION: Status post image guided placement of tunneled right IJ cuffed central venous catheter. Signed, Dulcy Fanny. Dellia Nims, RPVI [facility] Radiology Electronically Signed   By: Corrie Mckusick D.O.   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  He has air throughout his colon on his film yesterday.  He is likely having this ileus secondary to his other acute medical problems which is not uncommon.  He needs to mobilize as he is able to as well keeping his electrolytes normal and minimizing narcotics as able.  His K is actually 5.2 today.  Given he has no n/v, I think continuing him on CLD today is fine unless he were to develop nausea or vomiting.  He recently had the SBO protocol that was negative, so again unlikely this is secondary to an obstruction.  We will follow with you.  FEN - CLD VTE - heparin gtt ID - cefepime    LOS: 7 days    Henreitta Cea , [facility]  Surgery 03/11/2020, 11:00 AM Please see Amion for pager number during day hours 7:00am-4:30pm or 7:00am -11:30am on weekends

## 2020-03-11 NOTE — NC FL2 (Signed)
MEDICAID FL2 LEVEL OF CARE SCREENING TOOL     IDENTIFICATION  Patient Name: [name] Birthdate: 1945/08/13 Sex: male Admission Date (Current Location): 03/03/2020  [facility] and [place] Number:  Herbalist and Address:  [facility],  [address] [place], [place]      Provider Number: [id number]  Attending Physician Name and Address:  Lavina Hamman, MD  Relative Name and Phone Number:  Laurann [place] daughter [phone]    Current Level of Care: SNF Recommended Level of Care: [place] Prior Approval Number:    Date Approved/Denied:   PASRR Number: [id number]  Discharge Plan: SNF    Current Diagnoses: Patient Active Problem List   Diagnosis Date Noted  . Bacteremia   . SBO (small bowel obstruction) ([place])   . Abdominal distension   . Effusion of hip joint, left   . Hip pain 03/03/2020  . Gout of right hand 02/23/2020  . AKI (acute kidney injury) ([place]) 01/14/2020  . Hyperkalemia 01/14/2020  . Hydroureteronephrosis 01/14/2020  . Colon cancer screening 04/28/2019  . Intermittent claudication ([place]) 01/22/2019  . GERD (gastroesophageal reflux disease) 10/23/2018  . Pseudophakia 08/11/2016  . Uncontrolled type 2 diabetes mellitus with hyperglycemia, without long-term current use of insulin ([place]) 06/26/2016  . Hyperlipidemia with target LDL less than 70 06/26/2016  . Angina pectoris ([place]) 06/26/2016  . Type 2 diabetes mellitus with hypoglycemia without coma ([place]) 06/24/2016  . Essential hypertension 06/24/2016  . CAD (coronary artery disease) 06/24/2016  . Asthma 06/24/2016  . Malignant neoplasm of prostate ([place]) 12/28/2015  . OA (osteoarthritis) of hip 04/09/2012    Orientation RESPIRATION BLADDER Height & Weight     Self, Time, Situation, Place  Normal Continent Weight: 104.3 kg Height:  5\' 9"  (175.3 cm)  BEHAVIORAL SYMPTOMS/MOOD NEUROLOGICAL BOWEL NUTRITION STATUS      Continent Diet (Regular)   AMBULATORY STATUS COMMUNICATION OF NEEDS Skin   Extensive Assist Verbally Surgical wounds (Left Hip replacement)                       Personal Care Assistance Level of Assistance  Bathing, Feeding, Dressing Bathing Assistance: Maximum assistance Feeding assistance: Limited assistance Dressing Assistance: Maximum assistance     Functional Limitations Info  Sight, Hearing, Speech Sight Info: Impaired Hearing Info: Adequate Speech Info: Adequate    SPECIAL CARE FACTORS FREQUENCY  PT (By licensed PT), OT (By licensed OT)     PT Frequency: X5 WEEK OT Frequency: X5 WEEK            Contractures Contractures Info: Not present    Additional Factors Info  Code Status Code Status Info: FULL             Current Medications (03/11/2020):  This is the current hospital active medication list Current Facility-Administered Medications  Medication Dose Route Frequency Provider Last Rate Last Admin  . 0.9 %  sodium chloride infusion   Intravenous Continuous Lavina Hamman, MD 75 mL/hr at 03/11/20 0842 New Bag at 03/11/20 0842  . acetaminophen (TYLENOL) tablet 325-650 mg  325-650 mg Oral Q6H PRN Edmisten, Kristie L, PA      . albuterol (VENTOLIN HFA) 108 (90 Base) MCG/ACT inhaler 2 puff  2 puff Inhalation Q6H PRN Edmisten, Kristie L, PA      . alum & mag hydroxide-simeth (MAALOX/MYLANTA) 200-200-20 MG/5ML suspension 30 mL  30 mL Oral Q6H PRN Edmisten, Kristie L, PA      .  amiodarone (PACERONE) tablet 200 mg  200 mg Oral Daily Charolette Forward, MD   200 mg at 03/11/20 0831  . atorvastatin (LIPITOR) tablet 40 mg  40 mg Oral Daily Edmisten, Kristie L, PA   40 mg at 03/11/20 0830  . bisacodyl (DULCOLAX) suppository 10 mg  10 mg Rectal Daily Edmisten, Kristie L, PA   10 mg at 03/11/20 0833  . bisacodyl (DULCOLAX) suppository 10 mg  10 mg Rectal Daily PRN Edmisten, Kristie L, PA      . ceFEPIme (MAXIPIME) 2 g in sodium chloride 0.9 % 100 mL IVPB  2 g Intravenous Q12H Edmisten, Kristie  L, PA 200 mL/hr at 03/11/20 0528 2 g at 03/11/20 0528  . Chlorhexidine Gluconate Cloth 2 % PADS 6 each  6 each Topical Daily Edmisten, Kristie L, PA   6 each at 03/10/20 0936  . docusate sodium (COLACE) capsule 100 mg  100 mg Oral BID Edmisten, Kristie L, PA   100 mg at 03/11/20 0831  . ferrous sulfate tablet 325 mg  325 mg Oral Q breakfast Edmisten, Kristie L, PA   325 mg at 03/11/20 0831  . gabapentin (NEURONTIN) capsule 300 mg  300 mg Oral TID Edmisten, Kristie L, PA   300 mg at 03/11/20 0831  . heparin ADULT infusion 100 units/mL (25000 units/267mL sodium chloride 0.45%)  1,500 Units/hr Intravenous Continuous Adrian Saran, RPH 15 mL/hr at 03/11/20 0237 1,500 Units/hr at 03/11/20 0237  . HYDROmorphone (DILAUDID) injection 1 mg  1 mg Intravenous Q3H PRN Edmisten, Kristie L, PA   1 mg at 03/10/20 0206  . insulin aspart (novoLOG) injection 0-15 Units  0-15 Units Subcutaneous TID WC Edmisten, Kristie L, PA   3 Units at 03/11/20 4580  . insulin aspart (novoLOG) injection 0-5 Units  0-5 Units Subcutaneous QHS Edmisten, Kristie L, PA   2 Units at 03/03/20 2146  . lip balm (CARMEX) ointment 1 application  1 application Topical BID Edmisten, Kristie L, PA   1 application at 99/83/38 2505  . magic mouthwash  15 mL Oral QID PRN Edmisten, Kristie L, PA      . magnesium citrate solution 1 Bottle  1 Bottle Oral Once PRN Edmisten, Kristie L, PA      . menthol-cetylpyridinium (CEPACOL) lozenge 3 mg  1 lozenge Oral PRN Edmisten, Kristie L, PA      . menthol-cetylpyridinium (CEPACOL) lozenge 3 mg  1 lozenge Oral PRN Edmisten, Kristie L, PA       Or  . phenol (CHLORASEPTIC) mouth spray 1 spray  1 spray Mouth/Throat PRN Edmisten, Kristie L, PA      . methocarbamol (ROBAXIN) tablet 500 mg  500 mg Oral Q6H PRN Edmisten, Kristie L, PA       Or  . methocarbamol (ROBAXIN) 500 mg in dextrose 5 % 50 mL IVPB  500 mg Intravenous Q6H PRN Edmisten, Kristie L, PA      . metoprolol tartrate (LOPRESSOR) injection 5 mg  5 mg  Intravenous Q6H PRN Edmisten, Kristie L, PA      . metoprolol tartrate (LOPRESSOR) tablet 25 mg  25 mg Oral BID Charolette Forward, MD      . morphine 2 MG/ML injection 0.5-1 mg  0.5-1 mg Intravenous Q2H PRN Edmisten, Kristie L, PA   1 mg at 03/09/20 2205  . ondansetron (ZOFRAN) tablet 4 mg  4 mg Oral Q6H PRN Edmisten, Kristie L, PA       Or  . ondansetron (ZOFRAN) injection 4 mg  4 mg Intravenous Q6H PRN Edmisten, Kristie L, PA      . oxyCODONE (Oxy IR/ROXICODONE) immediate release tablet 10 mg  10 mg Oral Q4H PRN Edmisten, Kristie L, PA   10 mg at 03/11/20 0831  . oxyCODONE (Oxy IR/ROXICODONE) immediate release tablet 5 mg  5 mg Oral Q4H PRN Edmisten, Kristie L, PA      . pantoprazole (PROTONIX) EC tablet 40 mg  40 mg Oral Daily Edmisten, Kristie L, PA   40 mg at 03/11/20 0831  . phenol (CHLORASEPTIC) mouth spray 2 spray  2 spray Mouth/Throat PRN Edmisten, Kristie L, PA   2 spray at 03/05/20 1228  . polyethylene glycol (MIRALAX / GLYCOLAX) packet 17 g  17 g Oral Daily PRN Edmisten, Kristie L, PA      . polyethylene glycol (MIRALAX / GLYCOLAX) packet 17 g  17 g Oral BID Elodia Florence., MD   17 g at 03/11/20 0831  . sodium chloride flush (NS) 0.9 % injection 5 mL  5 mL Intracatheter Q8H Edmisten, Kristie L, PA   5 mL at 03/11/20 7416     Discharge Medications: Please see discharge summary for a list of discharge medications.  Relevant Imaging Results:  Relevant Lab Results:   Additional Information SS#[id number]  Purcell Mouton, RN

## 2020-03-11 NOTE — Progress Notes (Signed)
Subjective:  Patient denies any chest pain or shortness of breath.  No further episodes of V. Tach on the monitor.  Remains in a flutter with controlled ventricular response.  Denies any abdominal pain.  States had BM yesterday, tolerating by mouth fluids  Objective:  Vital Signs in the last 24 hours: Temp:  [97.6 F (36.4 C)-98.3 F (36.8 C)] 97.6 F (36.4 C) (12/01 0528) Pulse Rate:  [81-101] 81 (12/01 0528) Resp:  [14-17] 16 (12/01 0528) BP: (135-176)/(71-80) 135/74 (12/01 0528) SpO2:  [98 %-99 %] 99 % (12/01 0528) Weight:  [104.3 kg] 104.3 kg (12/01 0528)  Intake/Output from previous day: 11/30 0701 - 12/01 0700 In: 3145.1 [P.O.:360; I.V.:2685.1; IV Piggyback:100] Out: 265 [Urine:75; Drains:190] Intake/Output from this shift: Total I/O In: 0  Out: 550 [Urine:550]  Physical Exam: Neck: no adenopathy, no carotid bruit, no JVD and supple, symmetrical, trachea midline Lungs: clear to auscultation bilaterally Heart: regularly irregular rhythm, S1, S2 normal and soft systolic murmur noted Abdomen: soft, distended, nontender.  Bowel sounds present Extremities: extremities normal, atraumatic, no cyanosis or edema  Lab Results: Recent Labs    03/10/20 0633 03/11/20 0143  WBC 13.3* 11.9*  HGB 10.7* 8.9*  PLT 449* 399   Recent Labs    03/10/20 0633 03/11/20 0143  NA 138 136  K 5.1 5.2*  CL 104 105  CO2 22 22  GLUCOSE 194* 223*  BUN 51* 58*  CREATININE 1.69* 1.84*   No results for input(s): TROPONINI in the last 72 hours.  Invalid input(s): CK, MB Hepatic Function Panel Recent Labs    03/10/20 0633  PROT 6.9  ALBUMIN 2.4*  AST 76*  ALT 67*  ALKPHOS 97  BILITOT 0.9  BILIDIR 0.2  IBILI 0.7   No results for input(s): CHOL in the last 72 hours. No results for input(s): PROTIME in the last 72 hours.  Imaging: Imaging results have been reviewed and DG Abd 1 View  Result Date: 03/11/2020 CLINICAL DATA:  Postoperative abdominal distension. EXAM: ABDOMEN - 1  VIEW COMPARISON:  March 10, 2020. FINDINGS: Stable position bilateral ureteral stents. No abnormal calcifications are noted. Slightly improved small bowel dilatation is noted consistent with ileus or possibly small bowel obstruction. No colonic dilatation is noted. IMPRESSION: Slightly improved small bowel dilatation is noted consistent with ileus or possibly small bowel obstruction. Stable position bilateral ureteral stents. Electronically Signed   By: Marijo Conception M.D.   On: 03/11/2020 08:31   DG Abd 1 View  Result Date: 03/10/2020 CLINICAL DATA:  Abdominal distension EXAM: ABDOMEN - 1 VIEW COMPARISON:  03/06/2020 FINDINGS: Two supine frontal views of the abdomen and pelvis are obtained. Enteric catheter is seen previously has been removed in the interim. There is diffuse gaseous distention of the small bowel, with increase in the size and number of distended loops since prior study. Minimal colonic gas is visualized. No masses or abnormal calcifications. Bilateral ureteral stents are noted. IMPRESSION: 1. Progressive gaseous distention of the small bowel, consistent with worsening obstruction or ileus. Electronically Signed   By: Randa Ngo M.D.   On: 03/10/2020 15:07   IR Fluoro Guide CV Line Right  Result Date: 03/10/2020 INDICATION: 74 year old male referred for tunneled central venous catheter EXAM: IMAGE GUIDED TUNNELED CENTRAL VENOUS CATHETER PLACEMENT MEDICATIONS: None. ANESTHESIA/SEDATION: None FLUOROSCOPY TIME:  Fluoroscopy Time: 0 minutes 6 seconds (3 mGy). COMPLICATIONS: None PROCEDURE: After written informed consent was obtained, patient was placed in the supine position on angiographic table. Patency of the right  internal jugular vein was confirmed with ultrasound with image documentation. Patient was prepped and draped in the usual sterile fashion including the right neck and right superior chest. Using ultrasound guidance, the skin and subcutaneous tissues overlying the right  internal jugular vein were generously infiltrated with 1% lidocaine without epinephrine. Using ultrasound guidance, the right internal jugular vein was punctured with a micropuncture needle, and an 018 wire was advanced into the right heart confirming venous access. A small stab incision was made with an 11 blade scalpel. Peel-away sheath was placed over the wire, and then the wire was removed, marking the wire for estimation of internal catheter length. The chest wall was then generously infiltrated with 1% lidocaine for local anesthesia along the tissue tract. Small stab incision was made with 11 blade scalpel, and then the catheter was back tunneled to the puncture site at the right internal jugular vein. Double-lumen catheter was pulled through the tract, with the catheter amputated at 22 cm. Catheter was advanced through the peel-away sheath, and the peel-away sheath was removed. Final image was stored. The catheter was anchored to the chest wall with 2 retention sutures, and Derma bond was used to seal the right internal jugular vein incision site and at the right chest wall. Patient tolerated the procedure well and remained hemodynamically stable throughout. No complications were encountered and no significant blood loss was encountered. FINDINGS: Tip at the cavoatrial junction IMPRESSION: Status post image guided placement of tunneled right IJ cuffed central venous catheter. Signed, Dulcy Fanny. Dellia Nims, RPVI [facility] Electronically Signed   By: Corrie Mckusick D.O.   On: 03/10/2020 17:28   IR US Guide Vasc Access Right  Result Date: 03/10/2020 INDICATION: 74 year old male referred for tunneled central venous catheter EXAM: IMAGE GUIDED TUNNELED CENTRAL VENOUS CATHETER PLACEMENT MEDICATIONS: None. ANESTHESIA/SEDATION: None FLUOROSCOPY TIME:  Fluoroscopy Time: 0 minutes 6 seconds (3 mGy). COMPLICATIONS: None PROCEDURE: After written informed  consent was obtained, patient was placed in the supine position on angiographic table. Patency of the right internal jugular vein was confirmed with ultrasound with image documentation. Patient was prepped and draped in the usual sterile fashion including the right neck and right superior chest. Using ultrasound guidance, the skin and subcutaneous tissues overlying the right internal jugular vein were generously infiltrated with 1% lidocaine without epinephrine. Using ultrasound guidance, the right internal jugular vein was punctured with a micropuncture needle, and an 018 wire was advanced into the right heart confirming venous access. A small stab incision was made with an 11 blade scalpel. Peel-away sheath was placed over the wire, and then the wire was removed, marking the wire for estimation of internal catheter length. The chest wall was then generously infiltrated with 1% lidocaine for local anesthesia along the tissue tract. Small stab incision was made with 11 blade scalpel, and then the catheter was back tunneled to the puncture site at the right internal jugular vein. Double-lumen catheter was pulled through the tract, with the catheter amputated at 22 cm. Catheter was advanced through the peel-away sheath, and the peel-away sheath was removed. Final image was stored. The catheter was anchored to the chest wall with 2 retention sutures, and Derma bond was used to seal the right internal jugular vein incision site and at the right chest wall. Patient tolerated the procedure well and remained hemodynamically stable throughout. No complications were encountered and no significant blood loss was encountered. FINDINGS: Tip at the cavoatrial junction IMPRESSION: Status post image  guided placement of tunneled right IJ cuffed central venous catheter. Signed, Dulcy Fanny. Dellia Nims, RPVI [facility] Electronically Signed   By: Corrie Mckusick D.O.   On:  03/10/2020 17:28    Cardiac Studies:  Assessment/Plan:  Atrial flutter with controlledventricular response chads Vascscoreof 3 on chronic anticoagulation now Nonsustained VT, asymptomatic Coronary artery disease history of MI in remote past stable Left hip septic arthritis Enterobacter bacteremia Hypertension Diabetes mellitus Hyperlipidemia Degenerative joint disease History of carcinoma of the prostate History of nephrolithiasis status post bilateral ureteric stents Chronic kidney disease stage IVimproved Anemia of chronic disease Plan Wean off Cardizem drip. Increase beta blocker as per orders. Okay to switch to eliquis once okay from surgical point of view. I will sign off.  Please call if needed. Follow-up with me in a few weeks as appropriate  LOS: 7 days    Charolette Forward 03/11/2020, 10:48 AM

## 2020-03-11 NOTE — Progress Notes (Signed)
   Subjective: 2 Days Post-Op Procedure(s) (LRB): INCISION AND DRAINAGE LEFT HIP WITH LINER EXCHANGE (Left) Patient reports pain as mild.   His hip feels a lot better  Objective: Vital signs in last 24 hours: Temp:  [97.6 F (36.4 C)-98.9 F (37.2 C)] 97.6 F (36.4 C) (12/01 0528) Pulse Rate:  [81-101] 81 (12/01 0528) Resp:  [14-17] 16 (12/01 0528) BP: (135-176)/(71-81) 135/74 (12/01 0528) SpO2:  [98 %-99 %] 99 % (12/01 0528) Weight:  [104.3 kg] 104.3 kg (12/01 0528)  Intake/Output from previous day:  Intake/Output Summary (Last 24 hours) at 03/11/2020 0736 Last data filed at 03/11/2020 0734 Gross per 24 hour  Intake 3145.12 ml  Output 515 ml  Net 2630.12 ml    Intake/Output this shift: Total I/O In: -  Out: 250 [Urine:250]  Labs: Recent Labs    03/09/20 0419 03/10/20 0633 03/11/20 0143  HGB 9.0* 10.7* 8.9*   Recent Labs    03/10/20 0633 03/11/20 0143  WBC 13.3* 11.9*  RBC 3.56* 2.99*  HCT 32.8* 28.3*  PLT 449* 399   Recent Labs    03/10/20 0633 03/11/20 0143  NA 138 136  K 5.1 5.2*  CL 104 105  CO2 22 22  BUN 51* 58*  CREATININE 1.69* 1.84*  GLUCOSE 194* 223*  CALCIUM 8.1* 7.8*   No results for input(s): LABPT, INR in the last 72 hours.  EXAM General - Patient is Alert, Appropriate and Oriented Extremity - Neurologically intact Neurovascular intact No cellulitis present Compartment soft Drain intact and no drainage on bandage Dressing/Incision - clean, dry, no drainage Motor Function - intact, moving foot and toes well on exam.   Past Medical History:  Diagnosis Date  . Arthritis   . Asthma    no inhaler  . Coronary artery disease    cardiologist-  dr spruill; last visit 3 mos ago per pt  . GERD (gastroesophageal reflux disease)   . History of MI (myocardial infarction)    1985  . Hydronephrosis, left   . Hypertension   . Myocardial infarction ([place])   . Nephrolithiasis    left  . Neuromuscular disorder (HCC)    TINGLING IN BOTH  HANDS  . Presence of tooth-root and mandibular implants    lower dental implants  . Prostate cancer ([place])   . Shortness of breath    WITH EXERTION  . Type 2 diabetes mellitus (HCC)     Assessment/Plan: 2 Days Post-Op Procedure(s) (LRB): INCISION AND DRAINAGE LEFT HIP WITH LINER EXCHANGE (Left) Active Problems:   OA (osteoarthritis) of hip   Type 2 diabetes mellitus with hypoglycemia without coma (HCC)   Essential hypertension   Hyperlipidemia with target LDL less than 70   Hip pain   Effusion of hip joint, left   Abdominal distension   SBO (small bowel obstruction) ([place])   Bacteremia   Up with therapy  Continue IV antibiotics and check cultures DVT Prophylaxis - Heparin Weight Bearing As Tolerated left Leg  Gaynelle Arabian 03/11/2020, 7:36 AM

## 2020-03-11 NOTE — TOC Progression Note (Signed)
Transition of Care [facility]) - Progression Note    Patient Details  Name: [name] MRN: [id number] Date of Birth: 10-07-1945  [facility] CM/SW Contact  Purcell Mouton, RN Phone Number: 03/11/2020, 1:22 PM  Clinical Narrative:     Spoke with pt's daughter [name] concerning SNF offers. [facility] was selected. Admission Coordinator aware.   Expected Discharge Plan: [place] Barriers to Discharge: No Barriers Identified  Expected Discharge Plan and Services Expected Discharge Plan: [place]   Discharge Planning Services: CM Consult   Living arrangements for the past 2 months: Single Family Home                                       Social Determinants of Health (SDOH) Interventions    Readmission Risk Interventions No flowsheet data found.

## 2020-03-11 NOTE — Progress Notes (Signed)
PROGRESS NOTE    [name]  LEX:[id number] DOB: 12-10-1945 DOA: 03/03/2020 PCP: Luetta Nutting, DO    Brief Narrative:  [name] is a 74 y.o. male with medical history significant of coronary artery disease, diabetes type 2, hypertension, anemia, CKD stage IIIb, osteoarthritis status post left hip arthroplasty, renal stone/hydronephrosis status post stents, prostate cancer ,paroxysmal atrial flutter not on anticoagulation who was sent for direct admission to [facility],Dr Aluisio.  Patient was seen in the emergency department on 03/01/2020 for left hip pain.  There was no report of injury.  He had CT left hip done which showed  fluid collection.  He was found to have elevated ESR and CRP.  He was discharged from the emergency department to follow-up with orthopedics as per orthopedics recommendation.    Assessment & Plan: Enterobacter Cloacae Bacteremia  Emphysematous Pyelitis  Left Hip Septic Arthritis/Prosthetic Joint Infection CT left hip showed large fluid and air containing collection surroudning the proximal femoral component measuring up to 8.7 cm, concerning for infection/abscess.  Fluid and air collection track within the L adductor muscle compartment extending towards the symphysis.  Periprosthetic lucency along the posterior cortex of the proximal femoral stem extending distally by approximately 7.5 cm, concerning for septic loosening.  Area of lucency along the anterior aspect of the proximal native femur with air seen within bone, concerning for osteo. S/p CT guided placed 10 F drainage catheter placement  Culture with abundant enterobacter cloacae (resistant to cefazolin) Blood culture with 1 of 2 sets positive for enterobacter cloacae (resistant to cefazolin) Urine cx with enterobacter species (resistant to cefazolin) Repeat blood cx NG < 24 hr 11/29 Ortho s/p I&D L hip with liner exchange on 11/29 -> cx with few gram  negative rods and rare gram positive cocci  ID consult, appreciate recs -> recommending transition to cefepime.  Repeat blood cx 11/29 pending.  S/p tunneled line on 11/30 by IR.  Will need 6 weeks IV abx, followed by oral abx - follow recs regarding GPC's in operative cx. urology recommending continue abx, consider nephrostomy tube if new onset fevers/decreased uop/worsening creatinine  Partial SBO vs Ileus: s/p NG tube placement for diffusely gas distended bowel Follow CT abdomen pelvis -> tiny richter hernia containing a very short loop of small bowel with an associated early/partial SBO. KUB 11/26 with mildly dilated small bowel loops Surgery c/s, appreciate recs - suspects this was ileus 2/2 medical issues that appears to be resolving Worsening distension today, 11/30 KUB with progressive gaseous distention of the small bowel, c/w worsening obstruction or ileus - Clear liquid diet, IV fluids, simethicone and Metamucil.  Paroxysmal A. fib/atrial flutter: Follows with Dr [name].  Not on any anticoagulation at home because of anemia. chadvasc at leat 3. Rate controlled  Appreciate cardiology assistance - amiodarone, continue IV heparin and cardizem per cards for now Heparin gtt  Transition to dilt gtt, low dose oral metop, IV metop prn  Follow repeat echo - EF 55-60%, no RWMA (see report)  Diabetes type 2:Hemoglobin A1c of 7.1 as per 9/21. SSI - hold home amaryl and januvia Adjust prn  AKI on CKD stage IIIb  Anion Gap Metabolic Acidosis: His kidney function has fluctuated over several months from 1.5-3, unclear baseline.  Continue to monitor kidney function.  Avoid nephrotoxins.  He needs to follow-up with nephrology as an outpatient. Creatinine up to 2.91 on 11/25, continue IVF Acidosis resolved  History of severe left hydroureteronephrosis/prostate cancer: Follows  with urology.  He is status post bilateral ureteral stent placement by Dr. [name] 10/6.  Currently prostate cancer is  in remission.  Normocytic anemia: Most likely associated  with chronic kidney disease, chronic medical problems.  Hemoglobin currently stable .  No report of hematochezia or melena.  Coronary artery disease: Takes aspirin.  continue to hold in preparation for procedure. Denies any chest pain.    Hypertension:  continue dilt and metop as noted above  Hyperlipidemia: Takes Lipitor.  GERD: Continue Protonix  Left hip pain/debility: We will request for PT/OT evaluation after procedure.  DVT prophylaxis: heparin Code Status: full  Family Communication: shelby at bedside Disposition:   Status is: inpatient  The patient will require care spanning > 2 midnights and should be moved to inpatient because: Inpatient level of care appropriate due to severity of illness  Dispo: The patient is from: Home              Anticipated d/c is to: pending              Anticipated d/c date is: > 3 days              Patient currently is not medically stable to d/c.   Consultants:   IR  Ortho  Procedures: IR IMPRESSION: Successful CT guided placement of a 12 French all purpose drain catheter into the complex fluid collection involving the proximal lateral aspect the left thigh with aspiration of 50 cc of purulent fluid. Samples were sent to the laboratory as requested by the ordering clinical team.  Echo IMPRESSIONS    1. Left ventricular ejection fraction, by estimation, is 55 to 60%. The  left ventricle has normal function. The left ventricle has no regional  wall motion abnormalities. There is mild concentric left ventricular  hypertrophy. Left ventricular diastolic  function could not be evaluated.  2. Right ventricular systolic function is normal. The right ventricular  size is normal.  3. Left atrial size was mildly dilated.  4. The pericardial effusion is circumferential.  5. The mitral valve is normal in structure. Mild mitral valve  regurgitation. No evidence of  mitral stenosis.  6. The aortic valve is normal in structure. Aortic valve regurgitation is  not visualized. Mild aortic valve sclerosis is present, with no evidence  of aortic valve stenosis.  7. The inferior vena cava is normal in size with greater than 50%  respiratory variability, suggesting right atrial pressure of 3 mmHg.   Comparison(s): No significant change from prior study. Prior images  reviewed side by side.  Antimicrobials: Anti-infectives (From admission, onward)   Start     Dose/Rate Route Frequency Ordered Stop   03/06/20 1700  ceFEPIme (MAXIPIME) 2 g in sodium chloride 0.9 % 100 mL IVPB        2 g 200 mL/hr over 30 Minutes Intravenous Every 12 hours 03/06/20 1340     03/05/20 1000  piperacillin-tazobactam (ZOSYN) IVPB 3.375 g  Status:  Discontinued        3.375 g 12.5 mL/hr over 240 Minutes Intravenous Every 8 hours 03/05/20 0904 03/06/20 1311      Subjective: Continues to have pain in the left hip.  No nausea no vomiting.  Passing gas.  Abdomen still distended.  Objective: Vitals:   03/10/20 1715 03/10/20 2100 03/11/20 0528 03/11/20 1315  BP: (!) 154/80  135/74 134/70  Pulse: (!) 101  81 83  Resp: _0 Temp: 98.1 F (36.7 C) 98.1 F (  36.7 C) 97.6 F (36.4 C) 98.2 F (36.8 C)  TempSrc: Oral Oral Oral Oral  SpO2:   99% 96%  Weight:   104.3 kg   Height:        Intake/Output Summary (Last 24 hours) at 03/11/2020 1823 Last data filed at 03/11/2020 1721 Gross per 24 hour  Intake 2134.32 ml  Output 1295 ml  Net 839.32 ml   Filed Weights   03/07/20 0500 03/09/20 0430 03/11/20 0528  Weight: 95.8 kg 101.1 kg 104.3 kg    Examination:  General: Appear in mild distress, no Rash; Oral Mucosa Clear, moist. no Abnormal Neck Mass Or lumps, Conjunctiva normal  Cardiovascular: S1 and S2 Present, no Murmur, Respiratory: good respiratory effort, Bilateral Air entry present and CTA, no Crackles, no wheezes Abdomen: Bowel Sound present, Soft and distended,  no tenderness Extremities: Left leg edema, no tenderness Neurology: alert and oriented to time, place, and person affect appropriate. no new focal deficit Gait not checked due to patient safety concerns    Data Reviewed: I have personally reviewed following labs and imaging studies  CBC: Recent Labs  Lab 03/05/20 0428 03/05/20 0428 03/06/20 0325 03/06/20 0325 03/07/20 0659 03/08/20 0610 03/09/20 0419 03/10/20 0633 03/11/20 0143  WBC 7.4   < > 9.9   < > 7.9 7.3 7.6 13.3* 11.9*  NEUTROABS 6.4  --  8.3*  --  6.4 5.8 6.0  --   --   HGB 10.3*   < > 9.9*   < > 9.4* 8.8* 9.0* 10.7* 8.9*  HCT 32.1*   < > 30.2*   < > 29.2* 28.4* 28.6* 32.8* 28.3*  MCV 92.8   < > 94.4   < > 94.2 96.3 94.7 92.1 94.6  PLT 601*   < > 605*   < > 573* 467* 500* 449* 399   < > = values in this interval not displayed.    Basic Metabolic Panel: Recent Labs  Lab 03/05/20 0428 03/05/20 0428 03/06/20 0325 03/06/20 0325 03/07/20 0659 03/08/20 0610 03/09/20 0419 03/10/20 0633 03/11/20 0143  NA 138   < > 138   < > 140 140 138 138 136  K 4.6   < > 4.3   < > 3.9 4.1 4.4 5.1 5.2*  CL 105   < > 106   < > 102 101 101 104 105  CO2 18*   < > 16*   < > _0 GLUCOSE 186*   < > 191*   < > 241* 158* 148* 194* 223*  BUN 117*   < > 132*   < > 131* 87* 57* 51* 58*  CREATININE 2.91*   < > 2.70*   < > 2.55* 1.82* 1.44* 1.69* 1.84*  CALCIUM 8.6*   < > 8.4*   < > 8.0* 8.2* 8.3* 8.1* 7.8*  MG 3.4*  --  3.3*  --  3.1* 2.8* 2.3  --   --   PHOS 6.5*  --  6.3*  --  5.0* 3.4 2.7  --   --    < > = values in this interval not displayed.    GFR: Estimated Creatinine Clearance: 41.9 mL/min (A) (by C-G formula based on SCr of 1.84 mg/dL (H)).  Liver Function Tests: Recent Labs  Lab 03/06/20 0325 03/07/20 0659 03/08/20 0610 03/09/20 0419 03/10/20 0633  AST _1 44* 76*  ALT _2 33 67*  ALKPHOS 143* 116 101 94 97  BILITOT  0.8 0.6 0.5 0.6 0.9  PROT 8.1 7.4 7.3 7.1 6.9  ALBUMIN 2.6* 2.4* 2.4* 2.3*  2.4*    CBG: Recent Labs  Lab 03/10/20 1708 03/10/20 2059 03/11/20 0751 03/11/20 1145 03/11/20 1705  GLUCAP 196* 197* 187* 167* 172*     Recent Results (from the past 240 hour(s))  Culture, blood (routine x 2)     Status: Abnormal   Collection Time: 03/03/20  7:04 PM   Specimen: BLOOD LEFT HAND  Result Value Ref Range Status   Specimen Description   Final    BLOOD LEFT HAND Performed at [facility], [place] [address] [place], [place] [address]    Special Requests   Final    BOTTLES DRAWN AEROBIC ONLY Blood Culture adequate volume Performed at [facility] [place] [address] [place], [place] [address]    Culture  Setup Time   Final    AEROBIC BOTTLE ONLY GRAM NEGATIVE RODS CRITICAL RESULT CALLED TO, READ BACK BY AND VERIFIED WITH: Ulice Bold PharmD 9:20 03/06/20 ([email or web address]) Performed at [facility], [place] [address] [place],  [address]    Culture ENTEROBACTER CLOACAE (A)  Final   Report Status 03/08/2020 FINAL  Final   Organism ID, Bacteria ENTEROBACTER CLOACAE  Final      Susceptibility   Enterobacter cloacae - MIC*    CEFAZOLIN >=64 RESISTANT Resistant     CEFEPIME <=0.12 SENSITIVE Sensitive     CEFTAZIDIME <=1 SENSITIVE Sensitive     CIPROFLOXACIN <=0.25 SENSITIVE Sensitive     GENTAMICIN <=1 SENSITIVE Sensitive     IMIPENEM <=0.25 SENSITIVE Sensitive     TRIMETH/SULFA <=20 SENSITIVE Sensitive     PIP/TAZO <=4 SENSITIVE Sensitive     * ENTEROBACTER CLOACAE  Blood Culture ID Panel (Reflexed)     Status: Abnormal   Collection Time: 03/03/20  7:04 PM  Result Value Ref Range Status   Enterococcus faecalis NOT DETECTED NOT DETECTED Final   Enterococcus Faecium NOT DETECTED NOT DETECTED Final   Listeria monocytogenes NOT DETECTED NOT DETECTED Final   Staphylococcus species NOT DETECTED NOT DETECTED Final   Staphylococcus aureus (BCID) NOT DETECTED NOT DETECTED Final   Staphylococcus epidermidis NOT DETECTED NOT DETECTED  Final   Staphylococcus lugdunensis NOT DETECTED NOT DETECTED Final   Streptococcus species NOT DETECTED NOT DETECTED Final   Streptococcus agalactiae NOT DETECTED NOT DETECTED Final   Streptococcus pneumoniae NOT DETECTED NOT DETECTED Final   Streptococcus pyogenes NOT DETECTED NOT DETECTED Final   A.calcoaceticus-baumannii NOT DETECTED NOT DETECTED Final   Bacteroides fragilis NOT DETECTED NOT DETECTED Final   Enterobacterales DETECTED (A) NOT DETECTED Final    Comment: Enterobacterales represent a large order of gram negative bacteria, not a single organism. CRITICAL RESULT CALLED TO, READ BACK BY AND VERIFIED WITH: Ulice Bold PharmD 9:20 03/06/20 ([email or web address])    Enterobacter cloacae complex DETECTED (A) NOT DETECTED Final    Comment: CRITICAL RESULT CALLED TO, READ BACK BY AND VERIFIED WITH: Ulice Bold PharmD 9:20 03/06/20 ([email or web address])    Escherichia coli NOT DETECTED NOT DETECTED Final   Klebsiella aerogenes NOT DETECTED NOT DETECTED Final   Klebsiella oxytoca NOT DETECTED NOT DETECTED Final   Klebsiella pneumoniae NOT DETECTED NOT DETECTED Final   Proteus species NOT DETECTED NOT DETECTED Final   Salmonella species NOT DETECTED NOT DETECTED Final   Serratia marcescens NOT DETECTED NOT DETECTED Final   Haemophilus influenzae NOT DETECTED NOT DETECTED Final   Neisseria meningitidis NOT DETECTED NOT DETECTED Final  Pseudomonas aeruginosa NOT DETECTED NOT DETECTED Final   Stenotrophomonas maltophilia NOT DETECTED NOT DETECTED Final   Candida albicans NOT DETECTED NOT DETECTED Final   Candida auris NOT DETECTED NOT DETECTED Final   Candida glabrata NOT DETECTED NOT DETECTED Final   Candida krusei NOT DETECTED NOT DETECTED Final   Candida parapsilosis NOT DETECTED NOT DETECTED Final   Candida tropicalis NOT DETECTED NOT DETECTED Final   Cryptococcus neoformans/gattii NOT DETECTED NOT DETECTED Final   CTX-M ESBL NOT DETECTED NOT DETECTED Final   Carbapenem resistance IMP NOT DETECTED NOT  DETECTED Final   Carbapenem resistance KPC NOT DETECTED NOT DETECTED Final   Carbapenem resistance NDM NOT DETECTED NOT DETECTED Final   Carbapenem resist OXA 48 LIKE NOT DETECTED NOT DETECTED Final   Carbapenem resistance VIM NOT DETECTED NOT DETECTED Final    Comment: Performed at [facility], [address] [place] [address], [place], [place] [address]  Culture, blood (routine x 2)     Status: None   Collection Time: 03/03/20  7:06 PM   Specimen: BLOOD RIGHT HAND  Result Value Ref Range Status   Specimen Description   Final    BLOOD RIGHT HAND Performed at [facility] [address] [place], [place] [address]    Special Requests   Final    BOTTLES DRAWN AEROBIC ONLY Blood Culture adequate volume Performed at [facility] [address], [place], [place] [address]    Culture   Final    NO GROWTH 5 DAYS Performed at [facility], [place] [address] [place], [place] [address]    Report Status 03/08/2020 FINAL  Final  Aerobic/Anaerobic Culture (surgical/deep wound)     Status: None   Collection Time: 03/04/20  3:38 PM   Specimen: Abscess  Result Value Ref Range Status   Specimen Description   Final    ABSCESS DRAIN LEFT LATERAL THIGH Performed at [facility] [place] [address], [place], [place] [address]    Special Requests   Final    NONE Performed at [facility], [place] [address], [place], [place] [address]    Gram Stain   Final    ABUNDANT WBC PRESENT,BOTH PMN AND MONONUCLEAR ABUNDANT GRAM NEGATIVE RODS    Culture   Final    ABUNDANT ENTEROBACTER CLOACAE NO ANAEROBES ISOLATED Performed at [facility], [place] [address] [place], [place] [address]    Report Status 03/09/2020 FINAL  Final   Organism ID, Bacteria ENTEROBACTER CLOACAE  Final      Susceptibility   Enterobacter cloacae - MIC*    CEFAZOLIN >=64 RESISTANT Resistant     CEFEPIME <=0.12 SENSITIVE Sensitive     CEFTAZIDIME <=1 SENSITIVE  Sensitive     CIPROFLOXACIN <=0.25 SENSITIVE Sensitive     GENTAMICIN <=1 SENSITIVE Sensitive     IMIPENEM 0.5 SENSITIVE Sensitive     TRIMETH/SULFA <=20 SENSITIVE Sensitive     PIP/TAZO <=4 SENSITIVE Sensitive     * ABUNDANT ENTEROBACTER CLOACAE  Culture, Urine     Status: Abnormal   Collection Time: 03/05/20  8:10 AM   Specimen: Urine, Random  Result Value Ref Range Status   Specimen Description   Final    URINE, RANDOM Performed at [facility], [place] [address], [place], [place] [address]    Special Requests   Final    NONE Performed at [facility], [place] [address], [place], [place] [address]    Culture >=  100,000 COLONIES/mL ENTEROBACTER SPECIES (A)  Final   Report Status 03/07/2020 FINAL  Final   Organism ID, Bacteria ENTEROBACTER SPECIES (A)  Final      Susceptibility   Enterobacter species - MIC*    CEFAZOLIN >=64 RESISTANT Resistant     CEFEPIME <=0.12 SENSITIVE Sensitive     CEFTRIAXONE <=0.25 SENSITIVE Sensitive     CIPROFLOXACIN <=0.25 SENSITIVE Sensitive     GENTAMICIN <=1 SENSITIVE Sensitive     IMIPENEM 0.5 SENSITIVE Sensitive     NITROFURANTOIN 32 SENSITIVE Sensitive     TRIMETH/SULFA <=20 SENSITIVE Sensitive     PIP/TAZO <=4 SENSITIVE Sensitive     * >=100,000 COLONIES/mL ENTEROBACTER SPECIES  Culture, blood (Routine X 2) w Reflex to ID Panel     Status: None (Preliminary result)   Collection Time: 03/09/20 12:50 PM   Specimen: BLOOD  Result Value Ref Range Status   Specimen Description   Final    BLOOD RIGHT WRIST Performed at [place] [address], [place], [place] [address]    Special Requests   Final    BOTTLES DRAWN AEROBIC ONLY Blood Culture adequate volume Performed at [facility] [address], [place], [place] [address]    Culture   Final    NO GROWTH 2 DAYS Performed at [facility] [address], [place], [place] [address]    Report Status PENDING   Incomplete  Culture, blood (Routine X 2) w Reflex to ID Panel     Status: None (Preliminary result)   Collection Time: 03/09/20 12:50 PM   Specimen: BLOOD RIGHT HAND  Result Value Ref Range Status   Specimen Description   Final    BLOOD RIGHT HAND Performed at [place] [address], [place], [place] [address]    Special Requests   Final    BOTTLES DRAWN AEROBIC AND ANAEROBIC Blood Culture adequate volume Performed at [facility] [address], [place], [place] [address]    Culture   Final    NO GROWTH 2 DAYS Performed at [facility] [address], [place], [place] [address]    Report Status PENDING  Incomplete  Aerobic/Anaerobic Culture (surgical/deep wound)     Status: None (Preliminary result)   Collection Time: 03/09/20  3:53 PM   Specimen: PATH Other; Tissue  Result Value Ref Range Status   Specimen Description   Final    ABSCESS RIGHT HIP Performed at [place] [address] [place], [place] [address]    Special Requests   Final    NONE Performed at [facility], [place] [address] [place], [place] [address]    Gram Stain   Final    FEW WBC PRESENT,BOTH PMN AND MONONUCLEAR FEW GRAM NEGATIVE RODS RARE GRAM POSITIVE COCCI Performed at [facility], [place] [address] [place],  [address]    Culture   Final    FEW GRAM NEGATIVE RODS IDENTIFICATION AND SUSCEPTIBILITIES TO FOLLOW NO ANAEROBES ISOLATED; CULTURE IN PROGRESS FOR 5 DAYS    Report Status PENDING  Incomplete         Radiology Studies: DG Abd 1 View  Result Date: 03/11/2020 CLINICAL DATA:  Postoperative abdominal distension. EXAM: ABDOMEN - 1 VIEW COMPARISON:  March 10, 2020. FINDINGS: Stable position bilateral ureteral stents. No abnormal calcifications are noted. Slightly improved small bowel dilatation is noted consistent with ileus or possibly small bowel obstruction. No colonic dilatation  is  noted. IMPRESSION: Slightly improved small bowel dilatation is noted consistent with ileus or possibly small bowel obstruction. Stable position bilateral ureteral stents. Electronically Signed   By: Marijo Conception M.D.   On: 03/11/2020 08:31   DG Abd 1 View  Result Date: 03/10/2020 CLINICAL DATA:  Abdominal distension EXAM: ABDOMEN - 1 VIEW COMPARISON:  03/06/2020 FINDINGS: Two supine frontal views of the abdomen and pelvis are obtained. Enteric catheter is seen previously has been removed in the interim. There is diffuse gaseous distention of the small bowel, with increase in the size and number of distended loops since prior study. Minimal colonic gas is visualized. No masses or abnormal calcifications. Bilateral ureteral stents are noted. IMPRESSION: 1. Progressive gaseous distention of the small bowel, consistent with worsening obstruction or ileus. Electronically Signed   By: Randa Ngo M.D.   On: 03/10/2020 15:07   IR Fluoro Guide CV Line Right  Result Date: 03/10/2020 INDICATION: 74 year old male referred for tunneled central venous catheter EXAM: IMAGE GUIDED TUNNELED CENTRAL VENOUS CATHETER PLACEMENT MEDICATIONS: None. ANESTHESIA/SEDATION: None FLUOROSCOPY TIME:  Fluoroscopy Time: 0 minutes 6 seconds (3 mGy). COMPLICATIONS: None PROCEDURE: After written informed consent was obtained, patient was placed in the supine position on angiographic table. Patency of the right internal jugular vein was confirmed with ultrasound with image documentation. Patient was prepped and draped in the usual sterile fashion including the right neck and right superior chest. Using ultrasound guidance, the skin and subcutaneous tissues overlying the right internal jugular vein were generously infiltrated with 1% lidocaine without epinephrine. Using ultrasound guidance, the right internal jugular vein was punctured with a micropuncture needle, and an 018 wire was advanced into the right heart confirming venous  access. A small stab incision was made with an 11 blade scalpel. Peel-away sheath was placed over the wire, and then the wire was removed, marking the wire for estimation of internal catheter length. The chest wall was then generously infiltrated with 1% lidocaine for local anesthesia along the tissue tract. Small stab incision was made with 11 blade scalpel, and then the catheter was back tunneled to the puncture site at the right internal jugular vein. Double-lumen catheter was pulled through the tract, with the catheter amputated at 22 cm. Catheter was advanced through the peel-away sheath, and the peel-away sheath was removed. Final image was stored. The catheter was anchored to the chest wall with 2 retention sutures, and Derma bond was used to seal the right internal jugular vein incision site and at the right chest wall. Patient tolerated the procedure well and remained hemodynamically stable throughout. No complications were encountered and no significant blood loss was encountered. FINDINGS: Tip at the cavoatrial junction IMPRESSION: Status post image guided placement of tunneled right IJ cuffed central venous catheter. Signed, Dulcy Fanny. Dellia Nims, RPVI [facility] Radiology Electronically Signed   By: Corrie Mckusick D.O.   On: 03/10/2020 17:28   IR US Guide Vasc Access Right  Result Date: 03/10/2020 INDICATION: 74 year old male referred for tunneled central venous catheter EXAM: IMAGE GUIDED TUNNELED CENTRAL VENOUS CATHETER PLACEMENT MEDICATIONS: None. ANESTHESIA/SEDATION: None FLUOROSCOPY TIME:  Fluoroscopy Time: 0 minutes 6 seconds (3 mGy). COMPLICATIONS: None PROCEDURE: After written informed consent was obtained, patient was placed in the supine position on angiographic table. Patency of the right internal jugular vein was confirmed with ultrasound with image documentation. Patient was prepped and draped in the usual sterile fashion including the  right neck and right superior chest. Using  ultrasound guidance, the skin and subcutaneous tissues overlying the right internal jugular vein were generously infiltrated with 1% lidocaine without epinephrine. Using ultrasound guidance, the right internal jugular vein was punctured with a micropuncture needle, and an 018 wire was advanced into the right heart confirming venous access. A small stab incision was made with an 11 blade scalpel. Peel-away sheath was placed over the wire, and then the wire was removed, marking the wire for estimation of internal catheter length. The chest wall was then generously infiltrated with 1% lidocaine for local anesthesia along the tissue tract. Small stab incision was made with 11 blade scalpel, and then the catheter was back tunneled to the puncture site at the right internal jugular vein. Double-lumen catheter was pulled through the tract, with the catheter amputated at 22 cm. Catheter was advanced through the peel-away sheath, and the peel-away sheath was removed. Final image was stored. The catheter was anchored to the chest wall with 2 retention sutures, and Derma bond was used to seal the right internal jugular vein incision site and at the right chest wall. Patient tolerated the procedure well and remained hemodynamically stable throughout. No complications were encountered and no significant blood loss was encountered. FINDINGS: Tip at the cavoatrial junction IMPRESSION: Status post image guided placement of tunneled right IJ cuffed central venous catheter. Signed, Dulcy Fanny. Dellia Nims, RPVI [facility] Electronically Signed   By: Corrie Mckusick D.O.   On: 03/10/2020 17:28        Scheduled Meds: . amiodarone  200 mg Oral Daily  . atorvastatin  40 mg Oral Daily  . Chlorhexidine Gluconate Cloth  6 each Topical Daily  . docusate sodium  100 mg Oral BID  . ferrous sulfate  325 mg Oral Q breakfast  . gabapentin   300 mg Oral TID  . insulin aspart  0-15 Units Subcutaneous TID WC  . insulin aspart  0-5 Units Subcutaneous QHS  . lip balm  1 application Topical BID  . metoprolol tartrate  25 mg Oral BID  . pantoprazole  40 mg Oral Daily  . psyllium  1 packet Oral Daily  . simethicone  80 mg Oral QID  . sodium chloride flush  10-40 mL Intracatheter Q12H  . sodium chloride flush  5 mL Intracatheter Q8H   Continuous Infusions: . sodium chloride 75 mL/hr at 03/11/20 0842  . ceFEPime (MAXIPIME) IV 2 g (03/11/20 1721)  . heparin 1,500 Units/hr (03/11/20 1731)  . methocarbamol (ROBAXIN) IV       LOS: 7 days    Time spent: over 65 min    Berle Mull, MD [facility]   To contact the attending provider between 7A-7P or the covering provider during after hours 7P-7A, please log into the web site [email or web address] and access using universal [place] password for that web site. If you do not have the password, please call the hospital operator.  03/11/2020, 6:23 PM

## 2020-03-11 NOTE — Plan of Care (Signed)
  Problem: Clinical Measurements: Goal: Will remain free from infection Outcome: Progressing Goal: Diagnostic test results will improve Outcome: Progressing   Problem: Activity: Goal: Risk for activity intolerance will decrease Outcome: Progressing   Problem: Nutrition: Goal: Adequate nutrition will be maintained Outcome: Progressing   Problem: Pain Managment: Goal: General experience of comfort will improve Outcome: Progressing   Problem: Safety: Goal: Ability to remain free from injury will improve Outcome: Progressing   Problem: Skin Integrity: Goal: Risk for impaired skin integrity will decrease Outcome: Progressing   

## 2020-03-11 NOTE — TOC Progression Note (Signed)
Transition of Care [facility]) - Progression Note    Patient Details  Name: KAIZEN IBSEN MRN: [id number] Date of Birth: 02-May-1945  [facility]) CM/SW Contact  Purcell Mouton, RN Phone Number: 03/11/2020, 11:15 AM  Clinical Narrative:    Spoke with pt and daughter Zacarias Pontes at bedside concerning discharge plan. Both, agreed with discharge to SNF. FL2 was faxed to SNF's in the area. Waiting bed offers.    Expected Discharge Plan: [place] Barriers to Discharge: No Barriers Identified  Expected Discharge Plan and Services Expected Discharge Plan: [place]   Discharge Planning Services: CM Consult   Living arrangements for the past 2 months: Single Family Home                                       Social Determinants of Health (SDOH) Interventions    Readmission Risk Interventions No flowsheet data found.

## 2020-03-11 NOTE — Progress Notes (Signed)
Subjective: He is concerned re stents put in by Urology   Antibiotics:  Anti-infectives (From admission, onward)   Start     Dose/Rate Route Frequency Ordered Stop   03/06/20 1700  ceFEPIme (MAXIPIME) 2 g in sodium chloride 0.9 % 100 mL IVPB        2 g 200 mL/hr over 30 Minutes Intravenous Every 12 hours 03/06/20 1340     03/05/20 1000  piperacillin-tazobactam (ZOSYN) IVPB 3.375 g  Status:  Discontinued        3.375 g 12.5 mL/hr over 240 Minutes Intravenous Every 8 hours 03/05/20 0904 03/06/20 1311      Medications: Scheduled Meds: . amiodarone  200 mg Oral Daily  . atorvastatin  40 mg Oral Daily  . bisacodyl  10 mg Rectal Daily  . Chlorhexidine Gluconate Cloth  6 each Topical Daily  . docusate sodium  100 mg Oral BID  . ferrous sulfate  325 mg Oral Q breakfast  . gabapentin  300 mg Oral TID  . insulin aspart  0-15 Units Subcutaneous TID WC  . insulin aspart  0-5 Units Subcutaneous QHS  . lip balm  1 application Topical BID  . metoprolol tartrate  12.5 mg Oral BID  . pantoprazole  40 mg Oral Daily  . polyethylene glycol  17 g Oral BID  . sodium chloride flush  5 mL Intracatheter Q8H   Continuous Infusions: . ceFEPime (MAXIPIME) IV 2 g (03/11/20 0528)  . diltiazem (CARDIZEM) infusion 15 mg/hr (03/11/20 0342)  . heparin 1,500 Units/hr (03/11/20 0237)  . lactated ringers 125 mL/hr at 03/11/20 0343  . methocarbamol (ROBAXIN) IV     PRN Meds:.acetaminophen, albuterol, alum & mag hydroxide-simeth, bisacodyl, HYDROmorphone (DILAUDID) injection, magic mouthwash, magnesium citrate, menthol-cetylpyridinium, menthol-cetylpyridinium **OR** phenol, methocarbamol **OR** methocarbamol (ROBAXIN) IV, metoCLOPramide **OR** metoCLOPramide (REGLAN) injection, metoprolol tartrate, morphine injection, ondansetron **OR** ondansetron (ZOFRAN) IV, oxyCODONE, oxyCODONE, phenol, polyethylene glycol    Objective: Weight change:   Intake/Output Summary (Last 24 hours) at 03/11/2020  0709 Last data filed at 03/11/2020 0237 Gross per 24 hour  Intake 3145.12 ml  Output 265 ml  Net 2880.12 ml   Blood pressure 135/74, pulse 81, temperature 97.6 F (36.4 C), temperature source Oral, resp. rate 16, height '5\' 9"'  (1.753 m), weight 104.3 kg, SpO2 99 %. Temp:  [97.6 F (36.4 C)-98.9 F (37.2 C)] 97.6 F (36.4 C) (12/01 0528) Pulse Rate:  [81-101] 81 (12/01 0528) Resp:  [14-17] 16 (12/01 0528) BP: (135-176)/(71-81) 135/74 (12/01 0528) SpO2:  [98 %-99 %] 99 % (12/01 0528) Weight:  [104.3 kg] 104.3 kg (12/01 0528)  Physical Exam: General: Alert and awake, oriented x3,  HEENT: anicteric sclera, EOMI CVS IRR IRR rate, normal  Chest: , no wheezing, no respiratory distress Abdomen: soft non-distended,  dressing in place Skin: no rashes, central line in place Neuro: nonfocal  CBC:    BMET Recent Labs    03/10/20 0633 03/11/20 0143  NA 138 136  K 5.1 5.2*  CL 104 105  CO2 22 22  GLUCOSE 194* 223*  BUN 51* 58*  CREATININE 1.69* 1.84*  CALCIUM 8.1* 7.8*     Liver Panel  Recent Labs    03/09/20 0419 03/10/20 0633  PROT 7.1 6.9  ALBUMIN 2.3* 2.4*  AST 44* 76*  ALT 33 67*  ALKPHOS 94 97  BILITOT 0.6 0.9  BILIDIR  --  0.2  IBILI  --  0.7       Sedimentation Rate  No results for input(s): ESRSEDRATE in the last 72 hours. C-Reactive Protein No results for input(s): CRP in the last 72 hours.  Micro Results: Recent Results (from the past 720 hour(s))  Urine Culture     Status: None   Collection Time: 02/21/20 11:27 AM   Specimen: Urine  Result Value Ref Range Status   MICRO NUMBER: [id number]  Final   SPECIMEN QUALITY: Adequate  Final   Sample Source URINE, CLEAN CATCH  Final   STATUS: FINAL  Final   Result:   Final    Less than 10,000 CFU/mL of single Gram negative organism isolated. No further testing will be performed. If clinically indicated, recollection using a method to minimize contamination, with prompt transfer to Urine Culture  Transport Tube, is recommended.  Resp Panel by RT-PCR (Flu A&B, Covid) Nasopharyngeal Swab     Status: None   Collection Time: 03/01/20 12:20 PM   Specimen: Nasopharyngeal Swab; Nasopharyngeal(NP) swabs in vial transport medium  Result Value Ref Range Status   SARS Coronavirus 2 by RT PCR NEGATIVE NEGATIVE Final    Comment: (NOTE) SARS-CoV-2 target nucleic acids are NOT DETECTED.  The SARS-CoV-2 RNA is generally detectable in upper respiratory specimens during the acute phase of infection. The lowest concentration of SARS-CoV-2 viral copies this assay can detect is 138 copies/mL. A negative result does not preclude SARS-Cov-2 infection and should not be used as the sole basis for treatment or other patient management decisions. A negative result may occur with  improper specimen collection/handling, submission of specimen other than nasopharyngeal swab, presence of viral mutation(s) within the areas targeted by this assay, and inadequate number of viral copies(<138 copies/mL). A negative result must be combined with clinical observations, patient history, and epidemiological information. The expected result is Negative.  Fact Sheet for Patients:  EntrepreneurPulse.com.au  Fact Sheet for Healthcare Providers:  IncredibleEmployment.be  This test is no t yet approved or cleared by the [place] FDA and  has been authorized for detection and/or diagnosis of SARS-CoV-2 by FDA under an Emergency Use Authorization (EUA). This EUA will remain  in effect (meaning this test can be used) for the duration of the COVID-19 declaration under Section 564(b)(1) of the Act, 21 U.S.C.section 360bbb-3(b)(1), unless the authorization is terminated  or revoked sooner.       Influenza A by PCR NEGATIVE NEGATIVE Final   Influenza B by PCR NEGATIVE NEGATIVE Final    Comment: (NOTE) The Xpert Xpress SARS-CoV-2/FLU/RSV plus assay is intended as an aid in the  diagnosis of influenza from Nasopharyngeal swab specimens and should not be used as a sole basis for treatment. Nasal washings and aspirates are unacceptable for Xpert Xpress SARS-CoV-2/FLU/RSV testing.  Fact Sheet for Patients: EntrepreneurPulse.com.au  Fact Sheet for Healthcare Providers: IncredibleEmployment.be  This test is not yet approved or cleared by the [place] FDA and has been authorized for detection and/or diagnosis of SARS-CoV-2 by FDA under an Emergency Use Authorization (EUA). This EUA will remain in effect (meaning this test can be used) for the duration of the COVID-19 declaration under Section 564(b)(1) of the Act, 21 U.S.C. section 360bbb-3(b)(1), unless the authorization is terminated or revoked.  Performed at [facility], [place] [address], [place], [place] [address]   Culture, blood (routine x 2)     Status: Abnormal   Collection Time: 03/03/20  7:04 PM   Specimen: BLOOD LEFT HAND  Result Value Ref Range Status   Specimen Description   Final  BLOOD LEFT HAND Performed at [place] [address], [place], [place] [address]    Special Requests   Final    BOTTLES DRAWN AEROBIC ONLY Blood Culture adequate volume Performed at [facility] [address] [place], [place] [address]    Culture  Setup Time   Final    AEROBIC BOTTLE ONLY GRAM NEGATIVE RODS CRITICAL RESULT CALLED TO, READ BACK BY AND VERIFIED WITH: Ulice Bold PharmD 9:20 03/06/20 ([email or web address]) Performed at [facility], [place] [address] [place], [place] [address]    Culture ENTEROBACTER CLOACAE (A)  Final   Report Status 03/08/2020 FINAL  Final   Organism ID, Bacteria ENTEROBACTER CLOACAE  Final      Susceptibility   Enterobacter cloacae - MIC*    CEFAZOLIN >=64 RESISTANT Resistant     CEFEPIME <=0.12 SENSITIVE Sensitive     CEFTAZIDIME <=1 SENSITIVE Sensitive     CIPROFLOXACIN <=0.25  SENSITIVE Sensitive     GENTAMICIN <=1 SENSITIVE Sensitive     IMIPENEM <=0.25 SENSITIVE Sensitive     TRIMETH/SULFA <=20 SENSITIVE Sensitive     PIP/TAZO <=4 SENSITIVE Sensitive     * ENTEROBACTER CLOACAE  Blood Culture ID Panel (Reflexed)     Status: Abnormal   Collection Time: 03/03/20  7:04 PM  Result Value Ref Range Status   Enterococcus faecalis NOT DETECTED NOT DETECTED Final   Enterococcus Faecium NOT DETECTED NOT DETECTED Final   Listeria monocytogenes NOT DETECTED NOT DETECTED Final   Staphylococcus species NOT DETECTED NOT DETECTED Final   Staphylococcus aureus (BCID) NOT DETECTED NOT DETECTED Final   Staphylococcus epidermidis NOT DETECTED NOT DETECTED Final   Staphylococcus lugdunensis NOT DETECTED NOT DETECTED Final   Streptococcus species NOT DETECTED NOT DETECTED Final   Streptococcus agalactiae NOT DETECTED NOT DETECTED Final   Streptococcus pneumoniae NOT DETECTED NOT DETECTED Final   Streptococcus pyogenes NOT DETECTED NOT DETECTED Final   A.calcoaceticus-baumannii NOT DETECTED NOT DETECTED Final   Bacteroides fragilis NOT DETECTED NOT DETECTED Final   Enterobacterales DETECTED (A) NOT DETECTED Final    Comment: Enterobacterales represent a large order of gram negative bacteria, not a single organism. CRITICAL RESULT CALLED TO, READ BACK BY AND VERIFIED WITH: Ulice Bold PharmD 9:20 03/06/20 ([email or web address])    Enterobacter cloacae complex DETECTED (A) NOT DETECTED Final    Comment: CRITICAL RESULT CALLED TO, READ BACK BY AND VERIFIED WITH: Ulice Bold PharmD 9:20 03/06/20 ([email or web address])    Escherichia coli NOT DETECTED NOT DETECTED Final   Klebsiella aerogenes NOT DETECTED NOT DETECTED Final   Klebsiella oxytoca NOT DETECTED NOT DETECTED Final   Klebsiella pneumoniae NOT DETECTED NOT DETECTED Final   Proteus species NOT DETECTED NOT DETECTED Final   Salmonella species NOT DETECTED NOT DETECTED Final   Serratia marcescens NOT DETECTED NOT DETECTED Final   Haemophilus influenzae  NOT DETECTED NOT DETECTED Final   Neisseria meningitidis NOT DETECTED NOT DETECTED Final   Pseudomonas aeruginosa NOT DETECTED NOT DETECTED Final   Stenotrophomonas maltophilia NOT DETECTED NOT DETECTED Final   Candida albicans NOT DETECTED NOT DETECTED Final   Candida auris NOT DETECTED NOT DETECTED Final   Candida glabrata NOT DETECTED NOT DETECTED Final   Candida krusei NOT DETECTED NOT DETECTED Final   Candida parapsilosis NOT DETECTED NOT DETECTED Final   Candida tropicalis NOT DETECTED NOT DETECTED Final   Cryptococcus neoformans/gattii NOT DETECTED NOT DETECTED Final   CTX-M ESBL NOT DETECTED NOT DETECTED Final   Carbapenem resistance IMP NOT DETECTED  NOT DETECTED Final   Carbapenem resistance KPC NOT DETECTED NOT DETECTED Final   Carbapenem resistance NDM NOT DETECTED NOT DETECTED Final   Carbapenem resist OXA 48 LIKE NOT DETECTED NOT DETECTED Final   Carbapenem resistance VIM NOT DETECTED NOT DETECTED Final    Comment: Performed at [facility], [place] [address], [place], [place] [address]  Culture, blood (routine x 2)     Status: None   Collection Time: 03/03/20  7:06 PM   Specimen: BLOOD RIGHT HAND  Result Value Ref Range Status   Specimen Description   Final    BLOOD RIGHT HAND Performed at [facility] [address], [place], [place] [address]    Special Requests   Final    BOTTLES DRAWN AEROBIC ONLY Blood Culture adequate volume Performed at [facility] [address], [place], [place] [address]    Culture   Final    NO GROWTH 5 DAYS Performed at [facility], [place] [address], [place], [place] [address]    Report Status 03/08/2020 FINAL  Final  Aerobic/Anaerobic Culture (surgical/deep wound)     Status: None   Collection Time: 03/04/20  3:38 PM   Specimen: Abscess  Result Value Ref Range Status   Specimen Description   Final    ABSCESS DRAIN LEFT LATERAL THIGH Performed at [facility] [place]  [address], [place], [place] [address]    Special Requests   Final    NONE Performed at [facility], [place] [address], [place], [place] [address]    Gram Stain   Final    ABUNDANT WBC PRESENT,BOTH PMN AND MONONUCLEAR ABUNDANT GRAM NEGATIVE RODS    Culture   Final    ABUNDANT ENTEROBACTER CLOACAE NO ANAEROBES ISOLATED Performed at [facility], [place] [address] [place], [place] [address]    Report Status 03/09/2020 FINAL  Final   Organism ID, Bacteria ENTEROBACTER CLOACAE  Final      Susceptibility   Enterobacter cloacae - MIC*    CEFAZOLIN >=64 RESISTANT Resistant     CEFEPIME <=0.12 SENSITIVE Sensitive     CEFTAZIDIME <=1 SENSITIVE Sensitive     CIPROFLOXACIN <=0.25 SENSITIVE Sensitive     GENTAMICIN <=1 SENSITIVE Sensitive     IMIPENEM 0.5 SENSITIVE Sensitive     TRIMETH/SULFA <=20 SENSITIVE Sensitive     PIP/TAZO <=4 SENSITIVE Sensitive     * ABUNDANT ENTEROBACTER CLOACAE  Culture, Urine     Status: Abnormal   Collection Time: 03/05/20  8:10 AM   Specimen: Urine, Random  Result Value Ref Range Status   Specimen Description   Final    URINE, RANDOM Performed at [facility], [place] [address] [place], [place] [address]    Special Requests   Final    NONE Performed at [facility], [place] [address] [place], [place] [address]    Culture >=100,000 COLONIES/mL ENTEROBACTER SPECIES (A)  Final   Report Status 03/07/2020 FINAL  Final   Organism ID, Bacteria ENTEROBACTER SPECIES (A)  Final      Susceptibility   Enterobacter species - MIC*    CEFAZOLIN >=64 RESISTANT Resistant     CEFEPIME <=0.12 SENSITIVE Sensitive     CEFTRIAXONE <=0.25 SENSITIVE Sensitive     CIPROFLOXACIN <=0.25 SENSITIVE Sensitive     GENTAMICIN <=1 SENSITIVE Sensitive     IMIPENEM 0.5 SENSITIVE Sensitive     NITROFURANTOIN 32 SENSITIVE Sensitive  TRIMETH/SULFA <=20 SENSITIVE Sensitive     PIP/TAZO <=4 SENSITIVE Sensitive     * >=100,000  COLONIES/mL ENTEROBACTER SPECIES  Culture, blood (Routine X 2) w Reflex to ID Panel     Status: None (Preliminary result)   Collection Time: 03/09/20 12:50 PM   Specimen: BLOOD  Result Value Ref Range Status   Specimen Description   Final    BLOOD RIGHT WRIST Performed at [facility] [address], [place], [place] [address]    Special Requests   Final    BOTTLES DRAWN AEROBIC ONLY Blood Culture adequate volume Performed at [facility] [address] [place], [place] [address]    Culture   Final    NO GROWTH < 24 HOURS Performed at [facility] [address] [place], [place] [address]    Report Status PENDING  Incomplete  Culture, blood (Routine X 2) w Reflex to ID Panel     Status: None (Preliminary result)   Collection Time: 03/09/20 12:50 PM   Specimen: BLOOD RIGHT HAND  Result Value Ref Range Status   Specimen Description   Final    BLOOD RIGHT HAND Performed at [place] [address], [place], [place] [address]    Special Requests   Final    BOTTLES DRAWN AEROBIC AND ANAEROBIC Blood Culture adequate volume Performed at [facility] [address] [place], [place] [address]    Culture   Final    NO GROWTH < 24 HOURS Performed at [place] [address], [place], [place] [address]    Report Status PENDING  Incomplete  Aerobic/Anaerobic Culture (surgical/deep wound)     Status: None (Preliminary result)   Collection Time: 03/09/20  3:53 PM   Specimen: PATH Other; Tissue  Result Value Ref Range Status   Specimen Description   Final    ABSCESS RIGHT HIP Performed at [facility] [address] [place], [place] [address]    Special Requests   Final    NONE Performed at [facility], [place] [address], [place], [place] [address]    Gram Stain   Final    FEW WBC PRESENT,BOTH PMN AND MONONUCLEAR FEW GRAM NEGATIVE RODS RARE  GRAM POSITIVE COCCI    Culture   Final    TOO YOUNG TO READ Performed at [facility], [place] [address], [place], [place] [address]    Report Status PENDING  Incomplete    Studies/Results: DG Abd 1 View  Result Date: 03/10/2020 CLINICAL DATA:  Abdominal distension EXAM: ABDOMEN - 1 VIEW COMPARISON:  03/06/2020 FINDINGS: Two supine frontal views of the abdomen and pelvis are obtained. Enteric catheter is seen previously has been removed in the interim. There is diffuse gaseous distention of the small bowel, with increase in the size and number of distended loops since prior study. Minimal colonic gas is visualized. No masses or abnormal calcifications. Bilateral ureteral stents are noted. IMPRESSION: 1. Progressive gaseous distention of the small bowel, consistent with worsening obstruction or ileus. Electronically Signed   By: Randa Ngo M.D.   On: 03/10/2020 15:07   IR Fluoro Guide CV Line Right  Result Date: 03/10/2020 INDICATION: 74 year old male referred for tunneled central venous catheter EXAM: IMAGE GUIDED TUNNELED CENTRAL VENOUS CATHETER PLACEMENT MEDICATIONS: None. ANESTHESIA/SEDATION: None FLUOROSCOPY TIME:  Fluoroscopy Time: 0 minutes 6 seconds (3 mGy). COMPLICATIONS: None PROCEDURE: After written informed consent was obtained, patient was placed  in the supine position on angiographic table. Patency of the right internal jugular vein was confirmed with ultrasound with image documentation. Patient was prepped and draped in the usual sterile fashion including the right neck and right superior chest. Using ultrasound guidance, the skin and subcutaneous tissues overlying the right internal jugular vein were generously infiltrated with 1% lidocaine without epinephrine. Using ultrasound guidance, the right internal jugular vein was punctured with a micropuncture needle, and an 018 wire was advanced into the right heart confirming venous access. A small stab incision was made with an 11  blade scalpel. Peel-away sheath was placed over the wire, and then the wire was removed, marking the wire for estimation of internal catheter length. The chest wall was then generously infiltrated with 1% lidocaine for local anesthesia along the tissue tract. Small stab incision was made with 11 blade scalpel, and then the catheter was back tunneled to the puncture site at the right internal jugular vein. Double-lumen catheter was pulled through the tract, with the catheter amputated at 22 cm. Catheter was advanced through the peel-away sheath, and the peel-away sheath was removed. Final image was stored. The catheter was anchored to the chest wall with 2 retention sutures, and Derma bond was used to seal the right internal jugular vein incision site and at the right chest wall. Patient tolerated the procedure well and remained hemodynamically stable throughout. No complications were encountered and no significant blood loss was encountered. FINDINGS: Tip at the cavoatrial junction IMPRESSION: Status post image guided placement of tunneled right IJ cuffed central venous catheter. Signed, Dulcy Fanny. Dellia Nims, RPVI [facility] Radiology Electronically Signed   By: Corrie Mckusick D.O.   On: 03/10/2020 17:28   IR US Guide Vasc Access Right  Result Date: 03/10/2020 INDICATION: 74 year old male referred for tunneled central venous catheter EXAM: IMAGE GUIDED TUNNELED CENTRAL VENOUS CATHETER PLACEMENT MEDICATIONS: None. ANESTHESIA/SEDATION: None FLUOROSCOPY TIME:  Fluoroscopy Time: 0 minutes 6 seconds (3 mGy). COMPLICATIONS: None PROCEDURE: After written informed consent was obtained, patient was placed in the supine position on angiographic table. Patency of the right internal jugular vein was confirmed with ultrasound with image documentation. Patient was prepped and draped in the usual sterile fashion including the right neck and right superior chest. Using ultrasound  guidance, the skin and subcutaneous tissues overlying the right internal jugular vein were generously infiltrated with 1% lidocaine without epinephrine. Using ultrasound guidance, the right internal jugular vein was punctured with a micropuncture needle, and an 018 wire was advanced into the right heart confirming venous access. A small stab incision was made with an 11 blade scalpel. Peel-away sheath was placed over the wire, and then the wire was removed, marking the wire for estimation of internal catheter length. The chest wall was then generously infiltrated with 1% lidocaine for local anesthesia along the tissue tract. Small stab incision was made with 11 blade scalpel, and then the catheter was back tunneled to the puncture site at the right internal jugular vein. Double-lumen catheter was pulled through the tract, with the catheter amputated at 22 cm. Catheter was advanced through the peel-away sheath, and the peel-away sheath was removed. Final image was stored. The catheter was anchored to the chest wall with 2 retention sutures, and Derma bond was used to seal the right internal jugular vein incision site and at the right chest wall. Patient tolerated the procedure well and remained hemodynamically stable throughout. No complications were encountered and no significant blood loss was  encountered. FINDINGS: Tip at the cavoatrial junction IMPRESSION: Status post image guided placement of tunneled right IJ cuffed central venous catheter. Signed, Dulcy Fanny. Dellia Nims, RPVI [facility] Radiology Electronically Signed   By: Corrie Mckusick D.O.   On: 03/10/2020 17:28      Assessment/Plan:  INTERVAL HISTORY: Central line placed  : Active Problems:   OA (osteoarthritis) of hip   Type 2 diabetes mellitus with hypoglycemia without coma (HCC)   Essential hypertension   Hyperlipidemia with target LDL less than 70   Hip pain   Effusion of hip joint, left    Abdominal distension   SBO (small bowel obstruction) ([place])   Bacteremia    AASHRITH EVES is a 74 y.o. male with  74 y.o.malewith PMHx of CAD, DM2, HTN, CKD, OA, s/p left hip arthoplasty (2001), prostate cancer, PAF, left ureteral stone s/p stenting (01/2020) who was sent from ortho clinic on 11/23 by Dr Wynelle Link. He is growing Enterobacter from blood, urine and hip aspirate. He is sp surgery with GNR and GPCC seen on GS  #1 Enterobacter bacteremia: blood cultures NGTD on repeat BEFORE line   #2 Prosthetic hip infection: sp staged revision with I and D yesterday  GS with expected GNR and also GPC (worry latter could be a contaminant but may feel obliged to treat if so I will stay away from vancomycin if this is a MR COAG neg staph or MRSA)  He will need 6 weeks of IV atnbiotics (cefepime given concern for AMP C with Enterobacter)  followed by oral antibiotics to get to 6 months if not longer    #3 Kidney stones, ureteral stents. 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    Diagnosis: Prosthetic hip infection  Culture Result: Enterobacter and some intraoperative culture data pending  Allergies  Allergen Reactions  . Shellfish Allergy Rash    OPAT Orders Discharge antibiotics:  Cefepime   Duration:  6-weeks  End Date:  April 20, 2020  Central line care Per Protocol:    Labs  weekly while on IV antibiotics: _x_ CBC with differential _x_ BMP w GFR/CMP _x_ CRP _x_ ESR  __ CK  _x_ Please have radiology pull central line completion of IV antibiotics __ Please leave PIC in place until doctor has seen patient or been notified  Fax weekly labs to [phone]   GILMAN OLAZABAL has an appointment on 04/13/2020 with Dr. Tommy Medal at 3:30 PM  The [facility] is located in the [facility] at  [place] in [place].  [address], which is located to the left of the elevators.  Phone: [phone]  Fax: [phone]  [email or web address]  He should arrive at least 61mnutes prior to the appointment.    LOS: 7 days   CAlcide Evener12/04/2019, 7:09 AM

## 2020-03-12 ENCOUNTER — Other Ambulatory Visit: Payer: Self-pay | Admitting: Urology

## 2020-03-12 DIAGNOSIS — E11649 Type 2 diabetes mellitus with hypoglycemia without coma: Secondary | ICD-10-CM | POA: Diagnosis not present

## 2020-03-12 DIAGNOSIS — M25452 Effusion, left hip: Secondary | ICD-10-CM | POA: Diagnosis not present

## 2020-03-12 DIAGNOSIS — T8459XD Infection and inflammatory reaction due to other internal joint prosthesis, subsequent encounter: Secondary | ICD-10-CM | POA: Diagnosis not present

## 2020-03-12 DIAGNOSIS — R7881 Bacteremia: Secondary | ICD-10-CM | POA: Diagnosis not present

## 2020-03-12 DIAGNOSIS — N3000 Acute cystitis without hematuria: Secondary | ICD-10-CM | POA: Diagnosis not present

## 2020-03-12 LAB — GLUCOSE, CAPILLARY
Glucose-Capillary: 142 mg/dL — ABNORMAL HIGH (ref 70–99)
Glucose-Capillary: 138 mg/dL — ABNORMAL HIGH (ref 70–99)
Glucose-Capillary: 182 mg/dL — ABNORMAL HIGH (ref 70–99)
Glucose-Capillary: 92 mg/dL (ref 70–99)

## 2020-03-12 LAB — CBC
HCT: 28.6 % — ABNORMAL LOW (ref 39.0–52.0)
Hemoglobin: 8.9 g/dL — ABNORMAL LOW (ref 13.0–17.0)
MCH: 29.7 pg (ref 26.0–34.0)
MCHC: 31.1 g/dL (ref 30.0–36.0)
MCV: 95.3 fL (ref 80.0–100.0)
Platelets: 418 10*3/uL — ABNORMAL HIGH (ref 150–400)
RBC: 3 MIL/uL — ABNORMAL LOW (ref 4.22–5.81)
RDW: 15.3 % (ref 11.5–15.5)
WBC: 11.9 10*3/uL — ABNORMAL HIGH (ref 4.0–10.5)
nRBC: 0.2 % (ref 0.0–0.2)

## 2020-03-12 LAB — COMPREHENSIVE METABOLIC PANEL
ALT: 49 U/L — ABNORMAL HIGH (ref 0–44)
AST: 35 U/L (ref 15–41)
Albumin: 2.3 g/dL — ABNORMAL LOW (ref 3.5–5.0)
Alkaline Phosphatase: 83 U/L (ref 38–126)
Anion gap: 7 (ref 5–15)
BUN: 47 mg/dL — ABNORMAL HIGH (ref 8–23)
CO2: 25 mmol/L (ref 22–32)
Calcium: 8 mg/dL — ABNORMAL LOW (ref 8.9–10.3)
Chloride: 107 mmol/L (ref 98–111)
Creatinine, Ser: 1.49 mg/dL — ABNORMAL HIGH (ref 0.61–1.24)
GFR, Estimated: 49 mL/min — ABNORMAL LOW (ref >60)
Glucose, Bld: 163 mg/dL — ABNORMAL HIGH (ref 70–99)
Potassium: 4.9 mmol/L (ref 3.5–5.1)
Sodium: 139 mmol/L (ref 135–145)
Total Bilirubin: 0.5 mg/dL (ref 0.3–1.2)
Total Protein: 6 g/dL — ABNORMAL LOW (ref 6.5–8.1)

## 2020-03-12 LAB — HEPARIN LEVEL (UNFRACTIONATED): Heparin Unfractionated: 0.91 IU/mL — ABNORMAL HIGH (ref 0.30–0.70)

## 2020-03-12 LAB — MAGNESIUM: Magnesium: 2 mg/dL (ref 1.7–2.4)

## 2020-03-12 MED ORDER — ENOXAPARIN SODIUM 100 MG/ML ~~LOC~~ SOLN
100.0000 mg | Freq: Two times a day (BID) | SUBCUTANEOUS | Status: DC
  Administered 2020-03-12 – 2020-03-15 (×8): 100 mg via SUBCUTANEOUS
  Filled 2020-03-12 (×8): qty 1

## 2020-03-12 NOTE — Progress Notes (Signed)
3 Days Post-Op    CC: Septic left hip  Subjective: He reports multiple loose stools he reports multiple loose stools yesterday, and some this AM.  He was apparently incontinent at one point and is fairly embarrassed over this.  His abdomen still fairly distended, but he says he is tolerating clear liquids well.  Objective: Vital signs in last 24 hours: Temp:  [97.9 F (36.6 C)-98.2 F (36.8 C)] 98.2 F (36.8 C) (12/02 0447) Pulse Rate:  [83-110] 110 (12/02 0447) Resp:  [12-15] 15 (12/02 0447) BP: (134-145)/(70-89) 145/89 (12/02 0447) SpO2:  [95 %-98 %] 95 % (12/02 0447) Weight:  [103.8 kg] 103.8 kg (12/02 0447) Last BM Date: 03/10/20 240 p.o. 844 IV Urine 1500 Drain 170 BM x1 Afebrile vital signs are stable. CMP shows a potassium of 4.9, magnesium 2.0, creatinine down to 1.49 WBC 11.9 H/H 8.9/28.6  Intake/Output from previous day: 12/01 0701 - 12/02 0700 In: 1084.3 [P.O.:240; I.V.:844.3] Out: 1670 [Urine:1500; Drains:170] Intake/Output this shift: Total I/O In: -  Out: 640 [Urine:550; Drains:90]  General appearance: alert, cooperative and no distress Resp: clear to auscultation bilaterally GI: Still has a large distended abdomen.  Bowel sounds are present, he is having multiple loose stools.  Lab Results:  Recent Labs    03/11/20 0143 03/12/20 0450  WBC 11.9* 11.9*  HGB 8.9* 8.9*  HCT 28.3* 28.6*  PLT 399 418*    BMET Recent Labs    03/11/20 0143 03/12/20 0450  NA 136 139  K 5.2* 4.9  CL 105 107  CO2 22 25  GLUCOSE 223* 163*  BUN 58* 47*  CREATININE 1.84* 1.49*  CALCIUM 7.8* 8.0*   PT/INR No results for input(s): LABPROT, INR in the last 72 hours.  Recent Labs  Lab 03/07/20 0659 03/08/20 0610 03/09/20 0419 03/10/20 0633 03/12/20 0450  AST 16 22 44* 76* 35  ALT 14 17 33 67* 49*  ALKPHOS 116 101 94 97 83  BILITOT 0.6 0.5 0.6 0.9 0.5  PROT 7.4 7.3 7.1 6.9 6.0*  ALBUMIN 2.4* 2.4* 2.3* 2.4* 2.3*     Lipase     Component Value  Date/Time   LIPASE 21 07/25/2013 0844     Medications: . amiodarone  200 mg Oral Daily  . atorvastatin  40 mg Oral Daily  . Chlorhexidine Gluconate Cloth  6 each Topical Daily  . docusate sodium  100 mg Oral BID  . ferrous sulfate  325 mg Oral Q breakfast  . gabapentin  300 mg Oral TID  . insulin aspart  0-15 Units Subcutaneous TID WC  . insulin aspart  0-5 Units Subcutaneous QHS  . lip balm  1 application Topical BID  . metoprolol tartrate  25 mg Oral BID  . pantoprazole  40 mg Oral Daily  . psyllium  1 packet Oral Daily  . simethicone  80 mg Oral QID  . sodium chloride flush  10-40 mL Intracatheter Q12H  . sodium chloride flush  5 mL Intracatheter Q8H   . sodium chloride 75 mL/hr at 03/11/20 2225  . ceFEPime (MAXIPIME) IV 2 g (03/12/20 8657)  . heparin 1,500 Units/hr (03/11/20 1731)  . 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  He has air throughout his colon on his film yesterday.  He is likely having this ileus secondary to his other acute medical problems which is not uncommon.  He needs to mobilize as he is able to as well keeping his electrolytes normal and minimizing narcotics as able.  His K is actually 5.2 today.  Given he has no n/v, I think continuing him on CLD today is fine unless he were to develop nausea or vomiting.  He recently had the SBO protocol that was negative, so again unlikely this is secondary to an obstruction.  We will follow with you.  - BM recorded yesterday 12/1  FEN - CLD VTE - heparin gtt ID - cefepime   Plan: Multiple loose stools.  I do not think he is obstructed, I think this is an ileus.  I would  continue him on a clear liquids today and slowly advance his diet.  He still was quite distended.  Keep the potassium in the 4.0 range.  Mobilize as tolerated, the more the better from our standpoint.     LOS: 8 days    Sederick Jacobsen 03/12/2020 Please see Amion

## 2020-03-12 NOTE — Progress Notes (Signed)
PROGRESS NOTE    [name]  EHM:[id number] DOB: 16-Jun-1945 DOA: 03/03/2020 PCP: Luetta Nutting, DO    Brief Narrative:  [name] is a 74 y.o. male with medical history significant of coronary artery disease, diabetes type 2, hypertension, anemia, CKD stage IIIb, osteoarthritis status post left hip arthroplasty, renal stone/hydronephrosis status post stents, prostate cancer ,paroxysmal atrial flutter not on anticoagulation who was sent for direct admission to [facility],Dr Aluisio.  Patient was seen in the emergency department on 03/01/2020 for left hip pain.  There was no report of injury.  He had CT left hip done which showed  fluid collection.  He was found to have elevated ESR and CRP.  He was discharged from the emergency department to follow-up with orthopedics as per orthopedics recommendation.    Assessment & Plan: Enterobacter Cloacae Bacteremia  Emphysematous Pyelitis  Left Hip Septic Arthritis/Prosthetic Joint Infection CT left hip showed large fluid and air containing collection surroudning the proximal femoral component measuring up to 8.7 cm, concerning for infection/abscess.  Fluid and air collection track within the L adductor muscle compartment extending towards the symphysis.  Periprosthetic lucency along the posterior cortex of the proximal femoral stem extending distally by approximately 7.5 cm, concerning for septic loosening.  Area of lucency along the anterior aspect of the proximal native femur with air seen within bone, concerning for osteo. S/p CT guided placed 10 F drainage catheter placement  Culture with abundant enterobacter cloacae (resistant to cefazolin) Blood culture with 1 of 2 sets positive for enterobacter cloacae (resistant to cefazolin) Urine cx with enterobacter species (resistant to cefazolin) Repeat blood cx NG < 24 hr 11/29 Ortho s/p I&D L hip with liner exchange on 11/29 -> cx with few gram  negative rods and rare gram positive cocci  ID consult, appreciate recs -> recommending transition to cefepime.  Repeat blood cx 11/29 pending.  S/p tunneled line on 11/30 by IR.  Will need 6 weeks IV abx, followed by oral abx - follow recs regarding GPC's in operative cx. urology recommending continue abx, consider nephrostomy tube if new onset fevers/decreased uop/worsening creatinine  Partial SBO vs Ileus: s/p NG tube placement for diffusely gas distended bowel Follow CT abdomen pelvis -> tiny richter hernia containing a very short loop of small bowel with an associated early/partial SBO. KUB 11/26 with mildly dilated small bowel loops Surgery c/s, appreciate recs - suspects this was ileus 2/2 medical issues that appears to be resolving Worsening distension today, 11/30 KUB with progressive gaseous distention of the small bowel, c/w worsening obstruction or ileus - Clear liquid diet, IV fluids, simethicone and Metamucil. Improving.  Paroxysmal A. fib/atrial flutter: Follows with Dr [name].  Not on any anticoagulation at home because of anemia. chadvasc at leat 3. Rate controlled  Appreciate cardiology assistance - amiodarone, continue IV heparin and cardizem per cards for now Heparin gtt  Transition to dilt gtt, low dose oral metop, IV metop prn  Follow repeat echo - EF 55-60%, no RWMA (see report)  Diabetes type 2:Hemoglobin A1c of 7.1 as per 9/21. SSI - hold home amaryl and januvia Adjust prn  AKI on CKD stage IIIb  Anion Gap Metabolic Acidosis: His kidney function has fluctuated over several months from 1.5-3, unclear baseline.  Continue to monitor kidney function.  Avoid nephrotoxins.  He needs to follow-up with nephrology as an outpatient. Creatinine up to 2.91 on 11/25, continue IVF Acidosis resolved  History of severe left hydroureteronephrosis/prostate cancer:  Follows with urology.  He is status post bilateral ureteral stent placement by Dr. Tresa Moore 10/6.  Currently prostate  cancer is in remission. Patient will undergo stent exchange next Tuesday as that can be a nidus for infection as well.  Normocytic anemia: Most likely associated  with chronic kidney disease, chronic medical problems.  Hemoglobin currently stable .  No report of hematochezia or melena.  Coronary artery disease: Takes aspirin.  continue to hold in preparation for procedure. Denies any chest pain.    Hypertension:  continue dilt and metop as noted above  Hyperlipidemia: Takes Lipitor.  GERD: Continue Protonix  Left hip pain/debility:  PT recommends home with SNF  DVT prophylaxis: heparin Code Status: full  Family Communication: shelby at bedside Disposition:   Status is: inpatient  The patient will require care spanning > 2 midnights and should be moved to inpatient because: Inpatient level of care appropriate due to severity of illness  Dispo: The patient is from: Home              Anticipated d/c is to: pending              Anticipated d/c date is: > 3 days              Patient currently is not medically stable to d/c.   Consultants:   IR  Ortho  Procedures: IR IMPRESSION: Successful CT guided placement of a 12 French all purpose drain catheter into the complex fluid collection involving the proximal lateral aspect the left thigh with aspiration of 50 cc of purulent fluid. Samples were sent to the laboratory as requested by the ordering clinical team.  Echo IMPRESSIONS    1. Left ventricular ejection fraction, by estimation, is 55 to 60%. The  left ventricle has normal function. The left ventricle has no regional  wall motion abnormalities. There is mild concentric left ventricular  hypertrophy. Left ventricular diastolic  function could not be evaluated.  2. Right ventricular systolic function is normal. The right ventricular  size is normal.  3. Left atrial size was mildly dilated.  4. The pericardial effusion is circumferential.  5. The mitral  valve is normal in structure. Mild mitral valve  regurgitation. No evidence of mitral stenosis.  6. The aortic valve is normal in structure. Aortic valve regurgitation is  not visualized. Mild aortic valve sclerosis is present, with no evidence  of aortic valve stenosis.  7. The inferior vena cava is normal in size with greater than 50%  respiratory variability, suggesting right atrial pressure of 3 mmHg.   Comparison(s): No significant change from prior study. Prior images  reviewed side by side.  Antimicrobials: Anti-infectives (From admission, onward)   Start     Dose/Rate Route Frequency Ordered Stop   03/06/20 1700  ceFEPIme (MAXIPIME) 2 g in sodium chloride 0.9 % 100 mL IVPB        2 g 200 mL/hr over 30 Minutes Intravenous Every 12 hours 03/06/20 1340     03/05/20 1000  piperacillin-tazobactam (ZOSYN) IVPB 3.375 g  Status:  Discontinued        3.375 g 12.5 mL/hr over 240 Minutes Intravenous Every 8 hours 03/05/20 0904 03/06/20 1311      Subjective: No nausea no vomiting.  No fever no chills.  Abdomen still distended but improving.  Had a bowel movement.  No blood in the stool.  Passing gas.  Objective: Vitals:   03/11/20 1315 03/11/20 2155 03/12/20 0447 03/12/20 1421  BP: 134/70  138/72 (!) 145/89 (!) 147/62  Pulse: 83 85 (!) 110 99  Resp: _0 Temp: 98.2 F (36.8 C) 97.9 F (36.6 C) 98.2 F (36.8 C) 97.8 F (36.6 C)  TempSrc: Oral Oral Oral   SpO2: 96% 98% 95% 100%  Weight:   103.8 kg   Height:        Intake/Output Summary (Last 24 hours) at 03/12/2020 1928 Last data filed at 03/12/2020 6283 Gross per 24 hour  Intake --  Output 640 ml  Net -640 ml   Filed Weights   03/09/20 0430 03/11/20 0528 03/12/20 0447  Weight: 101.1 kg 104.3 kg 103.8 kg    Examination:  General: Appear in mild distress, no Rash; Oral Mucosa Clear, moist. no Abnormal Neck Mass Or lumps, Conjunctiva normal  Cardiovascular: S1 and S2 Present, no Murmur, Respiratory: good  respiratory effort, Bilateral Air entry present and CTA, no Crackles, no wheezes Abdomen: Bowel Sound present, Soft and distended, no tenderness Extremities: Left leg edema, no tenderness Neurology: alert and oriented to time, place, and person affect appropriate. no new focal deficit Gait not checked due to patient safety concerns    Data Reviewed: I have personally reviewed following labs and imaging studies  CBC: Recent Labs  Lab 03/06/20 0325 03/06/20 0325 03/07/20 0659 03/07/20 0659 03/08/20 0610 03/09/20 0419 03/10/20 0633 03/11/20 0143 03/12/20 0450  WBC 9.9   < > 7.9   < > 7.3 7.6 13.3* 11.9* 11.9*  NEUTROABS 8.3*  --  6.4  --  5.8 6.0  --   --   --   HGB 9.9*   < > 9.4*   < > 8.8* 9.0* 10.7* 8.9* 8.9*  HCT 30.2*   < > 29.2*   < > 28.4* 28.6* 32.8* 28.3* 28.6*  MCV 94.4   < > 94.2   < > 96.3 94.7 92.1 94.6 95.3  PLT 605*   < > 573*   < > 467* 500* 449* 399 418*   < > = values in this interval not displayed.    Basic Metabolic Panel: Recent Labs  Lab 03/06/20 0325 03/06/20 0325 03/07/20 0659 03/07/20 0659 03/08/20 0610 03/09/20 0419 03/10/20 0633 03/11/20 0143 03/12/20 0450  NA 138   < > 140   < > 140 138 138 136 139  K 4.3   < > 3.9   < > 4.1 4.4 5.1 5.2* 4.9  CL 106   < > 102   < > 101 101 104 105 107  CO2 16*   < > 25   < > _1 GLUCOSE 191*   < > 241*   < > 158* 148* 194* 223* 163*  BUN 132*   < > 131*   < > 87* 57* 51* 58* 47*  CREATININE 2.70*   < > 2.55*   < > 1.82* 1.44* 1.69* 1.84* 1.49*  CALCIUM 8.4*   < > 8.0*   < > 8.2* 8.3* 8.1* 7.8* 8.0*  MG 3.3*  --  3.1*  --  2.8* 2.3  --   --  2.0  PHOS 6.3*  --  5.0*  --  3.4 2.7  --   --   --    < > = values in this interval not displayed.    GFR: Estimated Creatinine Clearance: 51.6 mL/min (A) (by C-G formula based on SCr of 1.49 mg/dL (H)).  Liver Function Tests: Recent Labs  Lab 03/07/20 [phone] 03/08/20 [phone]  03/09/20 0419 03/10/20 0633 03/12/20 0450  AST 16 22 44* 76* 35  ALT 14  17 33 67* 49*  ALKPHOS 116 101 94 97 83  BILITOT 0.6 0.5 0.6 0.9 0.5  PROT 7.4 7.3 7.1 6.9 6.0*  ALBUMIN 2.4* 2.4* 2.3* 2.4* 2.3*    CBG: Recent Labs  Lab 03/11/20 1705 03/11/20 2151 03/12/20 0750 03/12/20 1238 03/12/20 1715  GLUCAP 172* 174* 142* 138* 182*     Recent Results (from the past 240 hour(s))  Culture, blood (routine x 2)     Status: Abnormal   Collection Time: 03/03/20  7:04 PM   Specimen: BLOOD LEFT HAND  Result Value Ref Range Status   Specimen Description   Final    BLOOD LEFT HAND Performed at [facility], [place] [address], [place], [place] [address]    Special Requests   Final    BOTTLES DRAWN AEROBIC ONLY Blood Culture adequate volume Performed at [facility] [address], [place], [place] [address]    Culture  Setup Time   Final    AEROBIC BOTTLE ONLY GRAM NEGATIVE RODS CRITICAL RESULT CALLED TO, READ BACK BY AND VERIFIED WITH: Ulice Bold PharmD 9:20 03/06/20 ([email or web address]) Performed at [facility], [place] [address], [place], [place] [address]    Culture ENTEROBACTER CLOACAE (A)  Final   Report Status 03/08/2020 FINAL  Final   Organism ID, Bacteria ENTEROBACTER CLOACAE  Final      Susceptibility   Enterobacter cloacae - MIC*    CEFAZOLIN >=64 RESISTANT Resistant     CEFEPIME <=0.12 SENSITIVE Sensitive     CEFTAZIDIME <=1 SENSITIVE Sensitive     CIPROFLOXACIN <=0.25 SENSITIVE Sensitive     GENTAMICIN <=1 SENSITIVE Sensitive     IMIPENEM <=0.25 SENSITIVE Sensitive     TRIMETH/SULFA <=20 SENSITIVE Sensitive     PIP/TAZO <=4 SENSITIVE Sensitive     * ENTEROBACTER CLOACAE  Blood Culture ID Panel (Reflexed)     Status: Abnormal   Collection Time: 03/03/20  7:04 PM  Result Value Ref Range Status   Enterococcus faecalis NOT DETECTED NOT DETECTED Final   Enterococcus Faecium NOT DETECTED NOT DETECTED Final   Listeria monocytogenes NOT DETECTED NOT DETECTED Final   Staphylococcus species NOT DETECTED NOT DETECTED  Final   Staphylococcus aureus (BCID) NOT DETECTED NOT DETECTED Final   Staphylococcus epidermidis NOT DETECTED NOT DETECTED Final   Staphylococcus lugdunensis NOT DETECTED NOT DETECTED Final   Streptococcus species NOT DETECTED NOT DETECTED Final   Streptococcus agalactiae NOT DETECTED NOT DETECTED Final   Streptococcus pneumoniae NOT DETECTED NOT DETECTED Final   Streptococcus pyogenes NOT DETECTED NOT DETECTED Final   A.calcoaceticus-baumannii NOT DETECTED NOT DETECTED Final   Bacteroides fragilis NOT DETECTED NOT DETECTED Final   Enterobacterales DETECTED (A) NOT DETECTED Final    Comment: Enterobacterales represent a large order of gram negative bacteria, not a single organism. CRITICAL RESULT CALLED TO, READ BACK BY AND VERIFIED WITH: Ulice Bold PharmD 9:20 03/06/20 ([email or web address])    Enterobacter cloacae complex DETECTED (A) NOT DETECTED Final    Comment: CRITICAL RESULT CALLED TO, READ BACK BY AND VERIFIED WITH: Ulice Bold PharmD 9:20 03/06/20 ([email or web address])    Escherichia coli NOT DETECTED NOT DETECTED Final   Klebsiella aerogenes NOT DETECTED NOT DETECTED Final   Klebsiella oxytoca NOT DETECTED NOT DETECTED Final   Klebsiella pneumoniae NOT DETECTED NOT DETECTED Final   Proteus species NOT DETECTED NOT DETECTED Final   Salmonella species NOT DETECTED NOT  DETECTED Final   Serratia marcescens NOT DETECTED NOT DETECTED Final   Haemophilus influenzae NOT DETECTED NOT DETECTED Final   Neisseria meningitidis NOT DETECTED NOT DETECTED Final   Pseudomonas aeruginosa NOT DETECTED NOT DETECTED Final   Stenotrophomonas maltophilia NOT DETECTED NOT DETECTED Final   Candida albicans NOT DETECTED NOT DETECTED Final   Candida auris NOT DETECTED NOT DETECTED Final   Candida glabrata NOT DETECTED NOT DETECTED Final   Candida krusei NOT DETECTED NOT DETECTED Final   Candida parapsilosis NOT DETECTED NOT DETECTED Final   Candida tropicalis NOT DETECTED NOT DETECTED Final   Cryptococcus neoformans/gattii  NOT DETECTED NOT DETECTED Final   CTX-M ESBL NOT DETECTED NOT DETECTED Final   Carbapenem resistance IMP NOT DETECTED NOT DETECTED Final   Carbapenem resistance KPC NOT DETECTED NOT DETECTED Final   Carbapenem resistance NDM NOT DETECTED NOT DETECTED Final   Carbapenem resist OXA 48 LIKE NOT DETECTED NOT DETECTED Final   Carbapenem resistance VIM NOT DETECTED NOT DETECTED Final    Comment: Performed at [facility], [address] [place], [place] [address]  Culture, blood (routine x 2)     Status: None   Collection Time: 03/03/20  7:06 PM   Specimen: BLOOD RIGHT HAND  Result Value Ref Range Status   Specimen Description   Final    BLOOD RIGHT HAND Performed at [place] [address], [place], [place] [address]    Special Requests   Final    BOTTLES DRAWN AEROBIC ONLY Blood Culture adequate volume Performed at [place] [address] [place], [place] [address]    Culture   Final    NO GROWTH 5 DAYS Performed at [facility], [place] [address], [place], [place] [address]    Report Status 03/08/2020 FINAL  Final  Aerobic/Anaerobic Culture (surgical/deep wound)     Status: None   Collection Time: 03/04/20  3:38 PM   Specimen: Abscess  Result Value Ref Range Status   Specimen Description   Final    ABSCESS DRAIN LEFT LATERAL THIGH Performed at [facility] [place] [address] [place], [place] [address]    Special Requests   Final    NONE Performed at [facility], [place] [address] [place], [place] [address]    Gram Stain   Final    ABUNDANT WBC PRESENT,BOTH PMN AND MONONUCLEAR ABUNDANT GRAM NEGATIVE RODS    Culture   Final    ABUNDANT ENTEROBACTER CLOACAE NO ANAEROBES ISOLATED Performed at [facility], [place] [address] [place], [place] [address]    Report Status 03/09/2020 FINAL  Final   Organism ID, Bacteria ENTEROBACTER CLOACAE  Final      Susceptibility   Enterobacter  cloacae - MIC*    CEFAZOLIN >=64 RESISTANT Resistant     CEFEPIME <=0.12 SENSITIVE Sensitive     CEFTAZIDIME <=1 SENSITIVE Sensitive     CIPROFLOXACIN <=0.25 SENSITIVE Sensitive     GENTAMICIN <=1 SENSITIVE Sensitive     IMIPENEM 0.5 SENSITIVE Sensitive     TRIMETH/SULFA <=20 SENSITIVE Sensitive     PIP/TAZO <=4 SENSITIVE Sensitive     * ABUNDANT ENTEROBACTER CLOACAE  Culture, Urine     Status: Abnormal   Collection Time: 03/05/20  8:10 AM   Specimen: Urine, Random  Result Value Ref Range Status   Specimen Description   Final    URINE, RANDOM Performed at [facility], [place] [address] [place],  [place] [address]    Special Requests   Final    NONE Performed at [facility], [place] [address], [place], [place] [address]    Culture >=100,000 COLONIES/mL ENTEROBACTER SPECIES (A)  Final   Report Status 03/07/2020 FINAL  Final   Organism ID, Bacteria ENTEROBACTER SPECIES (A)  Final      Susceptibility   Enterobacter species - MIC*    CEFAZOLIN >=64 RESISTANT Resistant     CEFEPIME <=0.12 SENSITIVE Sensitive     CEFTRIAXONE <=0.25 SENSITIVE Sensitive     CIPROFLOXACIN <=0.25 SENSITIVE Sensitive     GENTAMICIN <=1 SENSITIVE Sensitive     IMIPENEM 0.5 SENSITIVE Sensitive     NITROFURANTOIN 32 SENSITIVE Sensitive     TRIMETH/SULFA <=20 SENSITIVE Sensitive     PIP/TAZO <=4 SENSITIVE Sensitive     * >=100,000 COLONIES/mL ENTEROBACTER SPECIES  Culture, blood (Routine X 2) w Reflex to ID Panel     Status: None (Preliminary result)   Collection Time: 03/09/20 12:50 PM   Specimen: BLOOD  Result Value Ref Range Status   Specimen Description   Final    BLOOD RIGHT WRIST Performed at [facility] [address] [place], [place] [address]    Special Requests   Final    BOTTLES DRAWN AEROBIC ONLY Blood Culture adequate volume Performed at [facility] [address] [place], [place] [address]    Culture   Final      NO GROWTH 3 DAYS Performed at [facility], [place] [address], [place], [place] [address]    Report Status PENDING  Incomplete  Culture, blood (Routine X 2) w Reflex to ID Panel     Status: None (Preliminary result)   Collection Time: 03/09/20 12:50 PM   Specimen: BLOOD RIGHT HAND  Result Value Ref Range Status   Specimen Description   Final    BLOOD RIGHT HAND Performed at [facility] [address] [place], [place] [address]    Special Requests   Final    BOTTLES DRAWN AEROBIC AND ANAEROBIC Blood Culture adequate volume Performed at [facility] [address], [place], [place] [address]    Culture   Final    NO GROWTH 3 DAYS Performed at [facility], [place] [address] [place], [place] [address]    Report Status PENDING  Incomplete  Aerobic/Anaerobic Culture (surgical/deep wound)     Status: None (Preliminary result)   Collection Time: 03/09/20  3:53 PM   Specimen: PATH Other; Tissue  Result Value Ref Range Status   Specimen Description   Final    ABSCESS RIGHT HIP Performed at [facility] [address], [place], [place] [address]    Special Requests   Final    NONE Performed at [facility], [place] [address] [place], [place] [address]    Gram Stain   Final    FEW WBC PRESENT,BOTH PMN AND MONONUCLEAR FEW GRAM NEGATIVE RODS RARE GRAM POSITIVE COCCI Performed at [facility], [place] [address], [place],  [address]    Culture   Final    FEW ENTEROBACTER CLOACAE NO ANAEROBES ISOLATED; CULTURE IN PROGRESS FOR 5 DAYS    Report Status PENDING  Incomplete   Organism ID, Bacteria ENTEROBACTER CLOACAE  Final      Susceptibility   Enterobacter cloacae - MIC*    CEFAZOLIN >=64 RESISTANT Resistant     CEFEPIME <=0.12 SENSITIVE Sensitive  CEFTAZIDIME <=1 SENSITIVE Sensitive     CIPROFLOXACIN <=0.25 SENSITIVE Sensitive     GENTAMICIN <=1 SENSITIVE Sensitive     IMIPENEM  0.5 SENSITIVE Sensitive     TRIMETH/SULFA <=20 SENSITIVE Sensitive     PIP/TAZO <=4 SENSITIVE Sensitive     * FEW ENTEROBACTER CLOACAE         Radiology Studies: DG Abd 1 View  Result Date: 03/11/2020 CLINICAL DATA:  Postoperative abdominal distension. EXAM: ABDOMEN - 1 VIEW COMPARISON:  March 10, 2020. FINDINGS: Stable position bilateral ureteral stents. No abnormal calcifications are noted. Slightly improved small bowel dilatation is noted consistent with ileus or possibly small bowel obstruction. No colonic dilatation is noted. IMPRESSION: Slightly improved small bowel dilatation is noted consistent with ileus or possibly small bowel obstruction. Stable position bilateral ureteral stents. Electronically Signed   By: Marijo Conception M.D.   On: 03/11/2020 08:31        Scheduled Meds: . amiodarone  200 mg Oral Daily  . atorvastatin  40 mg Oral Daily  . Chlorhexidine Gluconate Cloth  6 each Topical Daily  . docusate sodium  100 mg Oral BID  . enoxaparin (LOVENOX) injection  100 mg Subcutaneous BID  . ferrous sulfate  325 mg Oral Q breakfast  . gabapentin  300 mg Oral TID  . insulin aspart  0-15 Units Subcutaneous TID WC  . insulin aspart  0-5 Units Subcutaneous QHS  . lip balm  1 application Topical BID  . metoprolol tartrate  25 mg Oral BID  . pantoprazole  40 mg Oral Daily  . psyllium  1 packet Oral Daily  . simethicone  80 mg Oral QID  . sodium chloride flush  10-40 mL Intracatheter Q12H  . sodium chloride flush  5 mL Intracatheter Q8H   Continuous Infusions: . sodium chloride 75 mL/hr at 03/12/20 1225  . ceFEPime (MAXIPIME) IV 2 g (03/12/20 1732)  . methocarbamol (ROBAXIN) IV       LOS: 8 days    Time spent: over 11 min    Berle Mull, MD [facility]   To contact the attending provider between 7A-7P or the covering provider during after hours 7P-7A, please log into the web site [email or web address] and access using universal [place] password for that  web site. If you do not have the password, please call the hospital operator.  03/12/2020, 7:28 PM

## 2020-03-12 NOTE — Progress Notes (Signed)
Physical Therapy Treatment Patient Details Name: [name] MRN: [id number] DOB: Mar 28, 1946 Today's Date: 03/12/2020    History of Present Illness Pt is 74 yo male admitted to ED from [facility] with L hip pain and fluid collection/septic arthritis. He is S/P I&D and bearing surface exchange 03/09/20 of L posterior THA. Pt found to have SBO vs ileus - surgery believes is ileus on 03/12/20 - per surgery mobilize as tolerated. Hx of R THA 2013, L THA 2001, DM, CAD, CKD, anemia    PT Comments    Pt making gradual progress with mobility.  He was able to ambulate 20' x 2 with assist but required rest breaks due to HR elevated to 140's with ambulation.  Was able to recall 2/3 hip precautions and required cues for precautions with transfers.  Continue plan of care.     Follow Up Recommendations  SNF     Equipment Recommendations  None recommended by PT    Recommendations for Other Services       Precautions / Restrictions Precautions Precautions: Fall;Posterior Hip Precaution Comments: Reviewed hip precautions (pt recalled 2 or 3) Restrictions Other Position/Activity Restrictions: WBAT    Mobility  Bed Mobility Overal bed mobility: Needs Assistance Bed Mobility: Supine to Sit     Supine to sit: Min assist;HOB elevated     General bed mobility comments: Assist for L LE, increased time, HOB elevated and heavy use of bed rails  Transfers Overall transfer level: Needs assistance Equipment used: Rolling walker (2 wheeled) Transfers: Sit to/from Stand Sit to Stand: Mod assist         General transfer comment: Cues for hip precautions and safe hand placement with transfer.  Stood from average height surface requiring mod A.  Performed x 2  Ambulation/Gait Ambulation/Gait assistance: Min assist Gait Distance (Feet): 20 Feet (20'x2) Assistive device: Rolling walker (2 wheeled) Gait Pattern/deviations: Step-to pattern;Decreased stride length;Shuffle;Decreased weight  shift to left;Trunk flexed Gait velocity: decreased   General Gait Details: Cues for RW proximity, posture, and safe turns.  HR increased to 120's initially but then to 147 bpm max.  Pt took 4 min seated rest break for HR recovery in window bench prior to ambulating back to chair.   Stairs             Wheelchair Mobility    Modified Rankin (Stroke Patients Only)       Balance Overall balance assessment: Needs assistance Sitting-balance support: No upper extremity supported;Feet supported Sitting balance-Leahy Scale: Good     Standing balance support: Bilateral upper extremity supported;During functional activity Standing balance-Leahy Scale: Poor Standing balance comment: required RW                            Cognition Arousal/Alertness: Awake/alert Behavior During Therapy: WFL for tasks assessed/performed Overall Cognitive Status: Within Functional Limits for tasks assessed                                        Exercises General Exercises - Lower Extremity Ankle Circles/Pumps: AROM;Both;10 reps;Supine Long Arc Quad: AAROM;Left;10 reps;Seated Hip ABduction/ADduction: AAROM;Left;10 reps;Seated    General Comments General comments (skin integrity, edema, etc.): HR up to 147 bpm with gait requiring frequent rest breaks; O2 sats >90% throughout session      Pertinent Vitals/Pain Pain Assessment: 0-10 Pain Score: 7  Pain Location: L  hip Pain Descriptors / Indicators: Sore Pain Intervention(s): Limited activity within patient's tolerance;Monitored during session;Repositioned;RN gave pain meds during session (Pt had not had pain meds today)    Home Living                      Prior Function            PT Goals (current goals can now be found in the care plan section) Acute Rehab PT Goals Patient Stated Goal: regain independence PT Goal Formulation: With patient Time For Goal Achievement: 03/18/20 Potential to Achieve  Goals: Good Progress towards PT goals: Progressing toward goals    Frequency    Min 3X/week      PT Plan Frequency needs to be updated    Co-evaluation              AM-PAC PT "6 Clicks" Mobility   Outcome Measure  Help needed turning from your back to your side while in a flat bed without using bedrails?: A Lot Help needed moving from lying on your back to sitting on the side of a flat bed without using bedrails?: A Lot Help needed moving to and from a bed to a chair (including a wheelchair)?: A Lot Help needed standing up from a chair using your arms (e.g., wheelchair or bedside chair)?: A Lot Help needed to walk in hospital room?: A Lot Help needed climbing 3-5 steps with a railing? : A Lot 6 Click Score: 12    End of Session Equipment Utilized During Treatment: Gait belt Activity Tolerance: Patient tolerated treatment well Patient left: in chair;with call bell/phone within reach;with chair alarm set Nurse Communication: Mobility status PT Visit Diagnosis: Muscle weakness (generalized) (M62.81);Other abnormalities of gait and mobility (R26.89);Pain Pain - Right/Left: Left Pain - part of body: Hip     Time: 3817-7116 PT Time Calculation (min) (ACUTE ONLY): 35 min  Charges:  $Gait Training: 8-22 mins $Therapeutic Exercise: 8-22 mins                     Abran Richard, PT Acute Rehab Services Pager [phone] [facility] [place] 03/12/2020, 12:32 PM

## 2020-03-12 NOTE — Progress Notes (Signed)
   Subjective: 3 Days Post-Op Procedure(s) (LRB): INCISION AND DRAINAGE LEFT HIP WITH LINER EXCHANGE (Left) Patient reports pain as mild.   Patient seen in rounds with Dr. Wynelle Link. Patient is well, and has had no acute complaints or problems. Hip is feeling well. No pain with attempted movement of the hip. Denies chest pain or SOB.  Objective: Vital signs in last 24 hours: Temp:  [97.9 F (36.6 C)-98.2 F (36.8 C)] 98.2 F (36.8 C) (12/02 0447) Pulse Rate:  [83-110] 110 (12/02 0447) Resp:  [12-15] 15 (12/02 0447) BP: (134-145)/(70-89) 145/89 (12/02 0447) SpO2:  [95 %-98 %] 95 % (12/02 0447) Weight:  [103.8 kg] 103.8 kg (12/02 0447)  Intake/Output from previous day:  Intake/Output Summary (Last 24 hours) at 03/12/2020 0806 Last data filed at 03/12/2020 0608 Gross per 24 hour  Intake 1084.3 ml  Output 1420 ml  Net -335.7 ml    Intake/Output this shift: No intake/output data recorded.  Labs: Recent Labs    03/10/20 0633 03/11/20 0143 03/12/20 0450  HGB 10.7* 8.9* 8.9*   Recent Labs    03/11/20 0143 03/12/20 0450  WBC 11.9* 11.9*  RBC 2.99* 3.00*  HCT 28.3* 28.6*  PLT 399 418*   Recent Labs    03/11/20 0143 03/12/20 0450  NA 136 139  K 5.2* 4.9  CL 105 107  CO2 22 25  BUN 58* 47*  CREATININE 1.84* 1.49*  GLUCOSE 223* 163*  CALCIUM 7.8* 8.0*   No results for input(s): LABPT, INR in the last 72 hours.  Exam: General - Patient is Alert and Oriented Extremity - Neurologically intact Neurovascular intact Sensation intact distally Dorsiflexion/Plantar flexion intact Dressing/Incision - clean, dry, no drainage. Motor Function - intact, moving foot and toes well on exam.   Past Medical History:  Diagnosis Date  . Arthritis   . Asthma    no inhaler  . Coronary artery disease    cardiologist-  dr spruill; last visit 3 mos ago per pt  . GERD (gastroesophageal reflux disease)   . History of MI (myocardial infarction)    1985  . Hydronephrosis, left     . Hypertension   . Myocardial infarction ([place])   . Nephrolithiasis    left  . 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 Presence of tooth-root and mandibular implants    lower dental implants  . Prostate cancer ([place])   . Shortness of breath    WITH EXERTION  . Type 2 diabetes mellitus (HCC)     Assessment/Plan: 3 Days Post-Op Procedure(s) (LRB): INCISION AND DRAINAGE LEFT HIP WITH LINER EXCHANGE (Left) Active Problems:   OA (osteoarthritis) of hip   Type 2 diabetes mellitus with hypoglycemia without coma (HCC)   Essential hypertension   Hyperlipidemia with target LDL less than 70   Hip pain   Effusion of hip joint, left   Abdominal distension   SBO (small bowel obstruction) (HCC)   Bacteremia  Estimated body mass index is 33.79 kg/m as calculated from the following:   Height as of this encounter: 5\' 9"  (1.753 m).   Weight as of this encounter: 103.8 kg. Up with therapy  DVT Prophylaxis - Heparin Weight-bearing as tolerated  Continue working with therapy. Hemovac drain pulled this morning without difficulty.   Theresa Duty, PA-C Orthopedic Surgery [phone] 03/12/2020, 8:06 AM

## 2020-03-12 NOTE — Progress Notes (Addendum)
Pharmacy Antibiotic Note  [name] is a 74 y.o. male presented to the ED on 03/03/2020 from [facility] for management of septic left hip with plan for I&D on 03/09/20. He underwent drainage cath placement into the hip abscess on 11/24 with culture growing out enterobacter cloacae.  Blood culture collected on 11/23 is also positive for enterobacter cloacae.  Patient was started on zosyn on 11/25. Pharmacy consulted on 11/26 to change abx to cefepime.  Today, 03/12/2020: - Day #7 abx   - afeb, wbc elevated - scr down to 1.49 (crcl~52) - POD3 following Left hip  I&D and liner exchange  Plan: - continue cefepime 2gm IV q12h - continue to monitor renal function closely - OPAT done, end date 04/20/20 ______________________________________  Height: 5\' 9"  (175.3 cm) Weight: 103.8 kg (228 lb 13.4 oz) IBW/kg (Calculated) : 70.7  Temp (24hrs), Avg:98.1 F (36.7 C), Min:97.9 F (36.6 C), Max:98.2 F (36.8 C)  Recent Labs  Lab 03/08/20 0610 03/09/20 0419 03/10/20 0633 03/11/20 0143 03/12/20 0450  WBC 7.3 7.6 13.3* 11.9* 11.9*  CREATININE 1.82* 1.44* 1.69* 1.84* 1.49*    Estimated Creatinine Clearance: 51.6 mL/min (A) (by C-G formula based on SCr of 1.49 mg/dL (H)).    Allergies  Allergen Reactions  . Shellfish Allergy Rash   Antimicrobials this admission:  11/25 zosyn>>11/26 11/26 cefepime>> Dose adjustments this admission:   Microbiology results:  11/25 UCx: >100K enterobacter species (R= ancef) FINAL 11/24 left thigh abscess: enterobacter cloacae (R= ancef) FINAL 11/23 bcx x2: 1/2 GNR (BCID= Enterobacter cloacae complex); R= ancef  Thank you for allowing pharmacy to be a part of this patient's care.  Pavneet Markwood P. Legrand Como, PharmD, BCPS Clinical Pharmacist Please utilize Amion for appropriate phone number to reach the unit pharmacist ([place]) 03/12/2020 8:05 AM

## 2020-03-12 NOTE — Progress Notes (Signed)
[place] for heparin Indication: atrial fibrillation  Allergies  Allergen Reactions  . Shellfish Allergy Rash    Patient Measurements: Height: 5\' 9"  (175.3 cm) Weight: 103.8 kg (228 lb 13.4 oz) IBW/kg (Calculated) : 70.7 Heparin Dosing Weight: 92 kg  Vital Signs: Temp: 98.2 F (36.8 C) (12/02 0447) Temp Source: Oral (12/02 0447) BP: 145/89 (12/02 0447) Pulse Rate: 110 (12/02 0447)  Labs: Recent Labs    03/10/20 3212 03/10/20 2482 03/10/20 1319 03/10/20 2113 03/11/20 0143 03/12/20 0450  HGB 10.7*   < >  --   --  8.9* 8.9*  HCT 32.8*  --   --   --  28.3* 28.6*  PLT 449*  --   --   --  399 418*  HEPARINUNFRC  --   --    < > 0.35 0.50 0.91*  CREATININE 1.69*  --   --   --  1.84* 1.49*   < > = values in this interval not displayed.    Estimated Creatinine Clearance: 51.6 mL/min (A) (by C-G formula based on SCr of 1.49 mg/dL (H)).   Assessment: Patient is a 74 y.o M with hx afib (not on anticoag. PTA) presented to the ED from [facility] for management of septic left hip with plan for I&D on 03/09/20.  He was found to be in afib. Pharmacy consulted to start heparin for afib.  Significant Events: - 11/24: IR placement of drainage cath into hip abscess  - 11/29 I&D hip abscess  - Heparin resumed 11/30 at 0530 am  Today, 03/12/20: - HL 0.91, supratherapeutic on 1500 units/hr - Hgb steady at 8.9 - Plts WNL - Scr down 1.49 - no bleeding noted  Goal of Therapy:  Heparin level 0.3-0.7 units/ml Monitor platelets by anticoagulation protocol: Yes   Plan:  - Decrease heparin infusion to 1350 units/hr - Obtain 8 hr Heparin level following rate change  - daily heparin level and CBC - monitor for s/sx bleeding - f/u transition to oral agent for Afib> cards note rec Eliquis  Dayani Winbush P. Legrand Como, PharmD, BCPS Clinical Pharmacist Please utilize Amion for appropriate phone number to reach the unit pharmacist 03/12/2020 7:48 AM

## 2020-03-12 NOTE — Progress Notes (Signed)
ANTICOAGULATION CONSULT NOTE - Follow Up Consult  Pharmacy Consult for Enoxaparin Indication: atrial fibrillation  Allergies  Allergen Reactions  . Shellfish Allergy Rash    Patient Measurements: Height: 5\' 9"  (175.3 cm) Weight: 103.8 kg (228 lb 13.4 oz) IBW/kg (Calculated) : 70.7  Vital Signs: Temp: 98.2 F (36.8 C) (12/02 0447) Temp Source: Oral (12/02 0447) BP: 145/89 (12/02 0447) Pulse Rate: 110 (12/02 0447)  Labs: Recent Labs    03/10/20 0017 03/10/20 4944 03/10/20 1319 03/10/20 2113 03/11/20 0143 03/12/20 0450  HGB 10.7*   < >  --   --  8.9* 8.9*  HCT 32.8*  --   --   --  28.3* 28.6*  PLT 449*  --   --   --  399 418*  HEPARINUNFRC  --   --    < > 0.35 0.50 0.91*  CREATININE 1.69*  --   --   --  1.84* 1.49*   < > = values in this interval not displayed.    Estimated Creatinine Clearance: 51.6 mL/min (A) (by C-G formula based on SCr of 1.49 mg/dL (H)).   Medications:  Medications Prior to Admission  Medication Sig Dispense Refill Last Dose  . albuterol (VENTOLIN HFA) 108 (90 Base) MCG/ACT inhaler Inhale 2 puffs into the lungs every 6 (six) hours as needed for wheezing or shortness of breath. 6.7 g 6 Past Month at Unknown time  . amLODipine (NORVASC) 10 MG tablet Take 10 mg by mouth daily.   03/03/2020 at Unknown time  . Aspirin 81 MG CAPS Take 81 mg by mouth daily.    03/03/2020 at Unknown time  . atorvastatin (LIPITOR) 40 MG tablet Take 1 tablet (40 mg total) by mouth daily. 30 tablet 0 03/03/2020 at Unknown time  . carvedilol (COREG) 25 MG tablet Take 25 mg by mouth 2 (two) times daily with a meal.   03/03/2020 at 12 noon  . co-enzyme Q-10 30 MG capsule Take 30 mg by mouth daily.   03/03/2020 at Unknown time  . cycloSPORINE (RESTASIS) 0.05 % ophthalmic emulsion Place 1 drop into both eyes 2 (two) times daily.    Past Month at Unknown time  . diclofenac Sodium (VOLTAREN) 1 % GEL Apply 4 g topically 4 (four) times daily. 100 g 0 03/03/2020 at Unknown time  .  docusate sodium (COLACE) 100 MG capsule Take 100 mg by mouth daily as needed for mild constipation.   02/18/2020  . ferrous sulfate 325 (65 FE) MG tablet Take 325 mg by mouth daily with breakfast.   03/03/2020 at Unknown time  . gabapentin (NEURONTIN) 300 MG capsule Take 1 capsule (300 mg total) by mouth 3 (three) times daily. Start 300mg  at bedtime x1 week then may increase to three times per day. (Patient taking differently: Take 300 mg by mouth in the morning, at noon, and at bedtime. ) 90 capsule 3 03/03/2020 at Unknown time  . glimepiride (AMARYL) 4 MG tablet Take 4 mg by mouth daily before breakfast.   03/03/2020 at Unknown time  . hydrochlorothiazide (HYDRODIURIL) 25 MG tablet Take 25 mg by mouth daily.   03/03/2020 at Unknown time  . morphine (MSIR) 15 MG tablet Take 0.5 tablets (7.5 mg total) by mouth every 4 (four) hours as needed for severe pain. 5 tablet 0 03/03/2020 at Unknown time  . predniSONE (DELTASONE) 50 MG tablet Take daily until resolution of gout symptoms. (Patient taking differently: Take 50 mg by mouth daily. Take daily until resolution of gout symptoms.)  10 tablet 0 03/03/2020 at Unknown time  . sitaGLIPtin (JANUVIA) 50 MG tablet Take 1 tablet (50 mg total) by mouth daily. 30 tablet 0 03/03/2020 at Unknown time  . spironolactone (ALDACTONE) 25 MG tablet Take 25 mg by mouth daily.   03/03/2020 at Unknown time  . Continuous Blood Gluc Receiver (FREESTYLE LIBRE 14 DAY READER) DEVI See admin instructions.     . Continuous Blood Gluc Sensor (FREESTYLE LIBRE 14 DAY SENSOR) MISC 1 Device by Does not apply route every 14 (fourteen) days. 6 each 1   . omeprazole (PRILOSEC) 40 MG capsule Take 1 capsule (40 mg total) by mouth 2 (two) times daily. X 1 month, then once daily (Patient not taking: Reported on 03/03/2020) 60 capsule 1 Not Taking at Unknown time    Assessment: Pharmacy consulted to switch heparin infusion to treatment dose enoxaparin for atrial fibrillation since ureteral stent  replacement not scheduled until 03/17/20.  Patient's estimated creatinine clearance is greater than 30 ml/min.  Goal of Therapy:  Anti-Xa level 0.6-1 units/ml 4hrs after LMWH dose given if clinically indicated Monitor platelets by anticoagulation protocol: Yes   Plan:  Discontinue heparin and heparin levels Enoxaparin 1 mg/kg subq every 12 hours through night prior to procedure Hold enoxaparin day of procedure and resume when authorized by urologist Monitor CBC, s/s bleeding   Efraim Kaufmann, PharmD, BCPS 03/12/2020,1:57 PM

## 2020-03-12 NOTE — Care Management Important Message (Signed)
Important Message  Patient Details IM Letter given to the Patient. Name: [name] MRN: [id number] Date of Birth: [birth year]   Medicare Important Message Given:  Yes     Kerin Salen 03/12/2020, 12:39 PM

## 2020-03-12 NOTE — Progress Notes (Signed)
Subjective:  No new complaints   Antibiotics:  Anti-infectives (From admission, onward)   Start     Dose/Rate Route Frequency Ordered Stop   03/06/20 1700  ceFEPIme (MAXIPIME) 2 g in sodium chloride 0.9 % 100 mL IVPB        2 g 200 mL/hr over 30 Minutes Intravenous Every 12 hours 03/06/20 1340     03/05/20 1000  piperacillin-tazobactam (ZOSYN) IVPB 3.375 g  Status:  Discontinued        3.375 g 12.5 mL/hr over 240 Minutes Intravenous Every 8 hours 03/05/20 0904 03/06/20 1311      Medications: Scheduled Meds: . amiodarone  200 mg Oral Daily  . atorvastatin  40 mg Oral Daily  . Chlorhexidine Gluconate Cloth  6 each Topical Daily  . docusate sodium  100 mg Oral BID  . ferrous sulfate  325 mg Oral Q breakfast  . gabapentin  300 mg Oral TID  . insulin aspart  0-15 Units Subcutaneous TID WC  . insulin aspart  0-5 Units Subcutaneous QHS  . lip balm  1 application Topical BID  . metoprolol tartrate  25 mg Oral BID  . pantoprazole  40 mg Oral Daily  . psyllium  1 packet Oral Daily  . simethicone  80 mg Oral QID  . sodium chloride flush  10-40 mL Intracatheter Q12H  . sodium chloride flush  5 mL Intracatheter Q8H   Continuous Infusions: . sodium chloride 75 mL/hr at 03/12/20 1225  . ceFEPime (MAXIPIME) IV 2 g (03/12/20 3354)  . heparin 1,350 Units/hr (03/12/20 1223)  . methocarbamol (ROBAXIN) IV     PRN Meds:.acetaminophen, albuterol, alum & mag hydroxide-simeth, bisacodyl, HYDROmorphone (DILAUDID) injection, magic mouthwash, magnesium citrate, menthol-cetylpyridinium, menthol-cetylpyridinium **OR** phenol, methocarbamol **OR** methocarbamol (ROBAXIN) IV, metoprolol tartrate, morphine injection, ondansetron **OR** ondansetron (ZOFRAN) IV, oxyCODONE, oxyCODONE, phenol, polyethylene glycol, sodium chloride flush    Objective: Weight change: -0.5 kg  Intake/Output Summary (Last 24 hours) at 03/12/2020 1247 Last data filed at 03/12/2020 0608 Gross per 24 hour  Intake  1084.3 ml  Output 870 ml  Net 214.3 ml   Blood pressure (!) 145/89, pulse (!) 110, temperature 98.2 F (36.8 C), temperature source Oral, resp. rate 15, height _0  (1.753 m), weight 103.8 kg, SpO2 95 %. Temp:  [97.9 F (36.6 C)-98.2 F (36.8 C)] 98.2 F (36.8 C) (12/02 0447) Pulse Rate:  [83-110] 110 (12/02 0447) Resp:  [12-15] 15 (12/02 0447) BP: (134-145)/(70-89) 145/89 (12/02 0447) SpO2:  [95 %-98 %] 95 % (12/02 0447) Weight:  [103.8 kg] 103.8 kg (12/02 0447)  Physical Exam: General: Alert and awake, oriented x3,  HEENT: anicteric sclera, EOMI CVS IRR IRR rate, normal  Chest: , no wheezing, no respiratory distress Abdomen: soft non-distended,  dressing in place Skin: no rashes, central line in place Neuro: nonfocal  CBC:    BMET Recent Labs    03/11/20 0143 03/12/20 0450  NA 136 139  K 5.2* 4.9  CL 105 107  CO2 22 25  GLUCOSE 223* 163*  BUN 58* 47*  CREATININE 1.84* 1.49*  CALCIUM 7.8* 8.0*     Liver Panel  Recent Labs    03/10/20 0633 03/12/20 0450  PROT 6.9 6.0*  ALBUMIN 2.4* 2.3*  AST 76* 35  ALT 67* 49*  ALKPHOS 97 83  BILITOT 0.9 0.5  BILIDIR 0.2  --   IBILI 0.7  --        Sedimentation Rate No results for input(s):  ESRSEDRATE in the last 72 hours. C-Reactive Protein No results for input(s): CRP in the last 72 hours.  Micro Results: Recent Results (from the past 720 hour(s))  Urine Culture     Status: None   Collection Time: 02/21/20 11:27 AM   Specimen: Urine  Result Value Ref Range Status   MICRO NUMBER: [id number]  Final   SPECIMEN QUALITY: Adequate  Final   Sample Source URINE, CLEAN CATCH  Final   STATUS: FINAL  Final   Result:   Final    Less than 10,000 CFU/mL of single Gram negative organism isolated. No further testing will be performed. If clinically indicated, recollection using a method to minimize contamination, with prompt transfer to Urine Culture Transport Tube, is recommended.  Resp Panel by RT-PCR (Flu A&B,  Covid) Nasopharyngeal Swab     Status: None   Collection Time: 03/01/20 12:20 PM   Specimen: Nasopharyngeal Swab; Nasopharyngeal(NP) swabs in vial transport medium  Result Value Ref Range Status   SARS Coronavirus 2 by RT PCR NEGATIVE NEGATIVE Final    Comment: (NOTE) SARS-CoV-2 target nucleic acids are NOT DETECTED.  The SARS-CoV-2 RNA is generally detectable in upper respiratory specimens during the acute phase of infection. The lowest concentration of SARS-CoV-2 viral copies this assay can detect is 138 copies/mL. A negative result does not preclude SARS-Cov-2 infection and should not be used as the sole basis for treatment or other patient management decisions. A negative result may occur with  improper specimen collection/handling, submission of specimen other than nasopharyngeal swab, presence of viral mutation(s) within the areas targeted by this assay, and inadequate number of viral copies(<138 copies/mL). A negative result must be combined with clinical observations, patient history, and epidemiological information. The expected result is Negative.  Fact Sheet for Patients:  EntrepreneurPulse.com.au  Fact Sheet for Healthcare Providers:  IncredibleEmployment.be  This test is no t yet approved or cleared by the [place] FDA and  has been authorized for detection and/or diagnosis of SARS-CoV-2 by FDA under an Emergency Use Authorization (EUA). This EUA will remain  in effect (meaning this test can be used) for the duration of the COVID-19 declaration under Section 564(b)(1) of the Act, 21 U.S.C.section 360bbb-3(b)(1), unless the authorization is terminated  or revoked sooner.       Influenza A by PCR NEGATIVE NEGATIVE Final   Influenza B by PCR NEGATIVE NEGATIVE Final    Comment: (NOTE) The Xpert Xpress SARS-CoV-2/FLU/RSV plus assay is intended as an aid in the diagnosis of influenza from Nasopharyngeal swab specimens and should  not be used as a sole basis for treatment. Nasal washings and aspirates are unacceptable for Xpert Xpress SARS-CoV-2/FLU/RSV testing.  Fact Sheet for Patients: EntrepreneurPulse.com.au  Fact Sheet for Healthcare Providers: IncredibleEmployment.be  This test is not yet approved or cleared by the [place] FDA and has been authorized for detection and/or diagnosis of SARS-CoV-2 by FDA under an Emergency Use Authorization (EUA). This EUA will remain in effect (meaning this test can be used) for the duration of the COVID-19 declaration under Section 564(b)(1) of the Act, 21 U.S.C. section 360bbb-3(b)(1), unless the authorization is terminated or revoked.  Performed at [facility], [place] [address] [place], [place] [address]   Culture, blood (routine x 2)     Status: Abnormal   Collection Time: 03/03/20  7:04 PM   Specimen: BLOOD LEFT HAND  Result Value Ref Range Status   Specimen Description   Final    BLOOD LEFT HAND Performed  at [facility], [place] [address] [place], [place] [address]    Special Requests   Final    BOTTLES DRAWN AEROBIC ONLY Blood Culture adequate volume Performed at [facility] [address], [place], [place] [address]    Culture  Setup Time   Final    AEROBIC BOTTLE ONLY GRAM NEGATIVE RODS CRITICAL RESULT CALLED TO, READ BACK BY AND VERIFIED WITH: Ulice Bold PharmD 9:20 03/06/20 ([email or web address]) Performed at [facility], [place] [address], [place], [place] [address]    Culture ENTEROBACTER CLOACAE (A)  Final   Report Status 03/08/2020 FINAL  Final   Organism ID, Bacteria ENTEROBACTER CLOACAE  Final      Susceptibility   Enterobacter cloacae - MIC*    CEFAZOLIN >=64 RESISTANT Resistant     CEFEPIME <=0.12 SENSITIVE Sensitive     CEFTAZIDIME <=1 SENSITIVE Sensitive     CIPROFLOXACIN <=0.25 SENSITIVE Sensitive     GENTAMICIN <=1 SENSITIVE Sensitive     IMIPENEM  <=0.25 SENSITIVE Sensitive     TRIMETH/SULFA <=20 SENSITIVE Sensitive     PIP/TAZO <=4 SENSITIVE Sensitive     * ENTEROBACTER CLOACAE  Blood Culture ID Panel (Reflexed)     Status: Abnormal   Collection Time: 03/03/20  7:04 PM  Result Value Ref Range Status   Enterococcus faecalis NOT DETECTED NOT DETECTED Final   Enterococcus Faecium NOT DETECTED NOT DETECTED Final   Listeria monocytogenes NOT DETECTED NOT DETECTED Final   Staphylococcus species NOT DETECTED NOT DETECTED Final   Staphylococcus aureus (BCID) NOT DETECTED NOT DETECTED Final   Staphylococcus epidermidis NOT DETECTED NOT DETECTED Final   Staphylococcus lugdunensis NOT DETECTED NOT DETECTED Final   Streptococcus species NOT DETECTED NOT DETECTED Final   Streptococcus agalactiae NOT DETECTED NOT DETECTED Final   Streptococcus pneumoniae NOT DETECTED NOT DETECTED Final   Streptococcus pyogenes NOT DETECTED NOT DETECTED Final   A.calcoaceticus-baumannii NOT DETECTED NOT DETECTED Final   Bacteroides fragilis NOT DETECTED NOT DETECTED Final   Enterobacterales DETECTED (A) NOT DETECTED Final    Comment: Enterobacterales represent a large order of gram negative bacteria, not a single organism. CRITICAL RESULT CALLED TO, READ BACK BY AND VERIFIED WITH: Ulice Bold PharmD 9:20 03/06/20 ([email or web address])    Enterobacter cloacae complex DETECTED (A) NOT DETECTED Final    Comment: CRITICAL RESULT CALLED TO, READ BACK BY AND VERIFIED WITH: Ulice Bold PharmD 9:20 03/06/20 ([email or web address])    Escherichia coli NOT DETECTED NOT DETECTED Final   Klebsiella aerogenes NOT DETECTED NOT DETECTED Final   Klebsiella oxytoca NOT DETECTED NOT DETECTED Final   Klebsiella pneumoniae NOT DETECTED NOT DETECTED Final   Proteus species NOT DETECTED NOT DETECTED Final   Salmonella species NOT DETECTED NOT DETECTED Final   Serratia marcescens NOT DETECTED NOT DETECTED Final   Haemophilus influenzae NOT DETECTED NOT DETECTED Final   Neisseria meningitidis NOT DETECTED  NOT DETECTED Final   Pseudomonas aeruginosa NOT DETECTED NOT DETECTED Final   Stenotrophomonas maltophilia NOT DETECTED NOT DETECTED Final   Candida albicans NOT DETECTED NOT DETECTED Final   Candida auris NOT DETECTED NOT DETECTED Final   Candida glabrata NOT DETECTED NOT DETECTED Final   Candida krusei NOT DETECTED NOT DETECTED Final   Candida parapsilosis NOT DETECTED NOT DETECTED Final   Candida tropicalis NOT DETECTED NOT DETECTED Final   Cryptococcus neoformans/gattii NOT DETECTED NOT DETECTED Final   CTX-M ESBL NOT DETECTED NOT DETECTED Final   Carbapenem resistance IMP NOT DETECTED NOT DETECTED Final  Carbapenem resistance KPC NOT DETECTED NOT DETECTED Final   Carbapenem resistance NDM NOT DETECTED NOT DETECTED Final   Carbapenem resist OXA 48 LIKE NOT DETECTED NOT DETECTED Final   Carbapenem resistance VIM NOT DETECTED NOT DETECTED Final    Comment: Performed at [facility], [place] [address] [place], [place] [address]  Culture, blood (routine x 2)     Status: None   Collection Time: 03/03/20  7:06 PM   Specimen: BLOOD RIGHT HAND  Result Value Ref Range Status   Specimen Description   Final    BLOOD RIGHT HAND Performed at [facility] [address], [place], [place] [address]    Special Requests   Final    BOTTLES DRAWN AEROBIC ONLY Blood Culture adequate volume Performed at [facility] [address] [place], [place] [address]    Culture   Final    NO GROWTH 5 DAYS Performed at [facility], [place] [address] [place], [place] [address]    Report Status 03/08/2020 FINAL  Final  Aerobic/Anaerobic Culture (surgical/deep wound)     Status: None   Collection Time: 03/04/20  3:38 PM   Specimen: Abscess  Result Value Ref Range Status   Specimen Description   Final    ABSCESS DRAIN LEFT LATERAL THIGH Performed at [facility] [address], [place], [place] [address]    Special Requests   Final     NONE Performed at [facility], [place] [address], [place], [place] [address]    Gram Stain   Final    ABUNDANT WBC PRESENT,BOTH PMN AND MONONUCLEAR ABUNDANT GRAM NEGATIVE RODS    Culture   Final    ABUNDANT ENTEROBACTER CLOACAE NO ANAEROBES ISOLATED Performed at [facility], [place] [address] [place], [place] [address]    Report Status 03/09/2020 FINAL  Final   Organism ID, Bacteria ENTEROBACTER CLOACAE  Final      Susceptibility   Enterobacter cloacae - MIC*    CEFAZOLIN >=64 RESISTANT Resistant     CEFEPIME <=0.12 SENSITIVE Sensitive     CEFTAZIDIME <=1 SENSITIVE Sensitive     CIPROFLOXACIN <=0.25 SENSITIVE Sensitive     GENTAMICIN <=1 SENSITIVE Sensitive     IMIPENEM 0.5 SENSITIVE Sensitive     TRIMETH/SULFA <=20 SENSITIVE Sensitive     PIP/TAZO <=4 SENSITIVE Sensitive     * ABUNDANT ENTEROBACTER CLOACAE  Culture, Urine     Status: Abnormal   Collection Time: 03/05/20  8:10 AM   Specimen: Urine, Random  Result Value Ref Range Status   Specimen Description   Final    URINE, RANDOM Performed at [facility], [place] [address] [place], [place] [address]    Special Requests   Final    NONE Performed at [facility], [place] [address] [place],  [address]    Culture >=100,000 COLONIES/mL ENTEROBACTER SPECIES (A)  Final   Report Status 03/07/2020 FINAL  Final   Organism ID, Bacteria ENTEROBACTER SPECIES (A)  Final      Susceptibility   Enterobacter species - MIC*    CEFAZOLIN >=64 RESISTANT Resistant     CEFEPIME <=0.12 SENSITIVE Sensitive     CEFTRIAXONE <=0.25 SENSITIVE Sensitive     CIPROFLOXACIN <=0.25 SENSITIVE Sensitive     GENTAMICIN <=1 SENSITIVE Sensitive     IMIPENEM 0.5 SENSITIVE Sensitive     NITROFURANTOIN 32 SENSITIVE Sensitive     TRIMETH/SULFA <=20 SENSITIVE Sensitive  PIP/TAZO <=4 SENSITIVE Sensitive     * >=100,000 COLONIES/mL ENTEROBACTER SPECIES  Culture, blood (Routine X 2) w Reflex  to ID Panel     Status: None (Preliminary result)   Collection Time: 03/09/20 12:50 PM   Specimen: BLOOD  Result Value Ref Range Status   Specimen Description   Final    BLOOD RIGHT WRIST Performed at [facility] [address] [place], [place] [address]    Special Requests   Final    BOTTLES DRAWN AEROBIC ONLY Blood Culture adequate volume Performed at [facility] [address] [place], [place] [address]    Culture   Final    NO GROWTH 3 DAYS Performed at [facility], [place] [address], [place], [place] [address]    Report Status PENDING  Incomplete  Culture, blood (Routine X 2) w Reflex to ID Panel     Status: None (Preliminary result)   Collection Time: 03/09/20 12:50 PM   Specimen: BLOOD RIGHT HAND  Result Value Ref Range Status   Specimen Description   Final    BLOOD RIGHT HAND Performed at [facility] [address] [place], [place] [address]    Special Requests   Final    BOTTLES DRAWN AEROBIC AND ANAEROBIC Blood Culture adequate volume Performed at [facility] [address] [place], [place] [address]    Culture   Final    NO GROWTH 3 DAYS Performed at [facility], [place] [address], [place], [place] [address]    Report Status PENDING  Incomplete  Aerobic/Anaerobic Culture (surgical/deep wound)     Status: None (Preliminary result)   Collection Time: 03/09/20  3:53 PM   Specimen: PATH Other; Tissue  Result Value Ref Range Status   Specimen Description   Final    ABSCESS RIGHT HIP Performed at [facility] [address], [place], [place] [address]    Special Requests   Final    NONE Performed at [facility], [place] [address] [place], [place] [address]    Gram Stain   Final    FEW WBC PRESENT,BOTH PMN AND MONONUCLEAR FEW GRAM NEGATIVE RODS RARE GRAM POSITIVE COCCI Performed at [facility], [place] [address],  [place], [place] [address]    Culture   Final    FEW ENTEROBACTER CLOACAE NO ANAEROBES ISOLATED; CULTURE IN PROGRESS FOR 5 DAYS    Report Status PENDING  Incomplete   Organism ID, Bacteria ENTEROBACTER CLOACAE  Final      Susceptibility   Enterobacter cloacae - MIC*    CEFAZOLIN >=64 RESISTANT Resistant     CEFEPIME <=0.12 SENSITIVE Sensitive     CEFTAZIDIME <=1 SENSITIVE Sensitive     CIPROFLOXACIN <=0.25 SENSITIVE Sensitive     GENTAMICIN <=1 SENSITIVE Sensitive     IMIPENEM 0.5 SENSITIVE Sensitive     TRIMETH/SULFA <=20 SENSITIVE Sensitive     PIP/TAZO <=4 SENSITIVE Sensitive     * FEW ENTEROBACTER CLOACAE    Studies/Results: DG Abd 1 View  Result Date: 03/11/2020 CLINICAL DATA:  Postoperative abdominal distension. EXAM: ABDOMEN - 1 VIEW COMPARISON:  March 10, 2020. FINDINGS: Stable position bilateral ureteral stents. No abnormal calcifications are noted. Slightly improved small bowel dilatation is noted consistent with ileus or possibly small bowel obstruction. No colonic dilatation is noted. IMPRESSION: Slightly improved small bowel dilatation is noted consistent with ileus or possibly small bowel obstruction. Stable position bilateral ureteral stents.  Electronically Signed   By: Marijo Conception M.D.   On: 03/11/2020 08:31   DG Abd 1 View  Result Date: 03/10/2020 CLINICAL DATA:  Abdominal distension EXAM: ABDOMEN - 1 VIEW COMPARISON:  03/06/2020 FINDINGS: Two supine frontal views of the abdomen and pelvis are obtained. Enteric catheter is seen previously has been removed in the interim. There is diffuse gaseous distention of the small bowel, with increase in the size and number of distended loops since prior study. Minimal colonic gas is visualized. No masses or abnormal calcifications. Bilateral ureteral stents are noted. IMPRESSION: 1. Progressive gaseous distention of the small bowel, consistent with worsening obstruction or ileus. Electronically Signed   By: Randa Ngo M.D.    On: 03/10/2020 15:07   IR Fluoro Guide CV Line Right  Result Date: 03/10/2020 INDICATION: 74 year old male referred for tunneled central venous catheter EXAM: IMAGE GUIDED TUNNELED CENTRAL VENOUS CATHETER PLACEMENT MEDICATIONS: None. ANESTHESIA/SEDATION: None FLUOROSCOPY TIME:  Fluoroscopy Time: 0 minutes 6 seconds (3 mGy). COMPLICATIONS: None PROCEDURE: After written informed consent was obtained, patient was placed in the supine position on angiographic table. Patency of the right internal jugular vein was confirmed with ultrasound with image documentation. Patient was prepped and draped in the usual sterile fashion including the right neck and right superior chest. Using ultrasound guidance, the skin and subcutaneous tissues overlying the right internal jugular vein were generously infiltrated with 1% lidocaine without epinephrine. Using ultrasound guidance, the right internal jugular vein was punctured with a micropuncture needle, and an 018 wire was advanced into the right heart confirming venous access. A small stab incision was made with an 11 blade scalpel. Peel-away sheath was placed over the wire, and then the wire was removed, marking the wire for estimation of internal catheter length. The chest wall was then generously infiltrated with 1% lidocaine for local anesthesia along the tissue tract. Small stab incision was made with 11 blade scalpel, and then the catheter was back tunneled to the puncture site at the right internal jugular vein. Double-lumen catheter was pulled through the tract, with the catheter amputated at 22 cm. Catheter was advanced through the peel-away sheath, and the peel-away sheath was removed. Final image was stored. The catheter was anchored to the chest wall with 2 retention sutures, and Derma bond was used to seal the right internal jugular vein incision site and at the right chest wall. Patient tolerated the procedure well and remained hemodynamically stable throughout. No  complications were encountered and no significant blood loss was encountered. FINDINGS: Tip at the cavoatrial junction IMPRESSION: Status post image guided placement of tunneled right IJ cuffed central venous catheter. Signed, Dulcy Fanny. Dellia Nims, RPVI [facility] Radiology Electronically Signed   By: Corrie Mckusick D.O.   On: 03/10/2020 17:28   IR US Guide Vasc Access Right  Result Date: 03/10/2020 INDICATION: 74 year old male referred for tunneled central venous catheter EXAM: IMAGE GUIDED TUNNELED CENTRAL VENOUS CATHETER PLACEMENT MEDICATIONS: None. ANESTHESIA/SEDATION: None FLUOROSCOPY TIME:  Fluoroscopy Time: 0 minutes 6 seconds (3 mGy). COMPLICATIONS: None PROCEDURE: After written informed consent was obtained, patient was placed in the supine position on angiographic table. Patency of the right internal jugular vein was confirmed with ultrasound with image documentation. Patient was prepped and draped in the usual sterile fashion including the right neck and right superior chest. Using ultrasound guidance, the skin and subcutaneous tissues overlying the right internal jugular vein were generously infiltrated with 1% lidocaine without epinephrine. Using ultrasound guidance,  the right internal jugular vein was punctured with a micropuncture needle, and an 018 wire was advanced into the right heart confirming venous access. A small stab incision was made with an 11 blade scalpel. Peel-away sheath was placed over the wire, and then the wire was removed, marking the wire for estimation of internal catheter length. The chest wall was then generously infiltrated with 1% lidocaine for local anesthesia along the tissue tract. Small stab incision was made with 11 blade scalpel, and then the catheter was back tunneled to the puncture site at the right internal jugular vein. Double-lumen catheter was pulled through the tract, with the catheter amputated at 22 cm.  Catheter was advanced through the peel-away sheath, and the peel-away sheath was removed. Final image was stored. The catheter was anchored to the chest wall with 2 retention sutures, and Derma bond was used to seal the right internal jugular vein incision site and at the right chest wall. Patient tolerated the procedure well and remained hemodynamically stable throughout. No complications were encountered and no significant blood loss was encountered. FINDINGS: Tip at the cavoatrial junction IMPRESSION: Status post image guided placement of tunneled right IJ cuffed central venous catheter. Signed, Dulcy Fanny. Dellia Nims, RPVI [facility] Radiology Electronically Signed   By: Corrie Mckusick D.O.   On: 03/10/2020 17:28      Assessment/Plan:  INTERVAL HISTORY:  Pt growing enterobacter from intraop culture now as well  : Active Problems:   OA (osteoarthritis) of hip   Type 2 diabetes mellitus with hypoglycemia without coma (HCC)   Essential hypertension   Hyperlipidemia with target LDL less than 70   Hip pain   Effusion of hip joint, left   Abdominal distension   SBO (small bowel obstruction) ([place])   Bacteremia    [name] is a 74 y.o. male with  74 y.o.malewith PMHx of CAD, DM2, HTN, CKD, OA, s/p left hip arthoplasty (2001), prostate cancer, PAF, left ureteral stone s/p stenting (01/2020) who was sent from ortho clinic on 11/23 by Dr Wynelle Link. He is growing Enterobacter from blood, urine and hip aspirate. 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    Diagnosis: Prosthetic hip infection  Culture Result: Enterobacter and some intraoperative culture data pending  Allergies  Allergen Reactions  . Shellfish Allergy Rash    OPAT Orders Discharge antibiotics:  Cefepime   Duration:  6-weeks  End Date:  April 20, 2020  Central line care Per Protocol:    Labs  weekly while on IV antibiotics: _x_ CBC with differential _x_ BMP w GFR/CMP _x_ CRP _x_ ESR  __ CK  _x_ Please have radiology pull central line completion of IV antibiotics __ Please leave PIC in place until doctor has seen patient or been notified  Fax weekly labs to [phone]   DESMON HITCHNER has an appointment on 04/13/2020 with Dr. Tommy Medal at 3:30 PM  The [facility] is located in the  [facility] in [place].  [address], which is located to the left of the elevators.  Phone: [phone]  Fax: [phone]  [email or web address]  He should arrive at least 51mnutes prior to the appointment.  WE have plan in place for him and I will plan on seeing him back in clinic  I will sign off for now  Please call with further questions.   LOS: 8 days   CAlcide Evener12/05/2019, 12:47 PM

## 2020-03-13 ENCOUNTER — Ambulatory Visit: Payer: HMO

## 2020-03-13 ENCOUNTER — Inpatient Hospital Stay (HOSPITAL_COMMUNITY): Payer: HMO

## 2020-03-13 LAB — CBC
HCT: 28.1 % — ABNORMAL LOW (ref 39.0–52.0)
Hemoglobin: 8.8 g/dL — ABNORMAL LOW (ref 13.0–17.0)
MCH: 30.1 pg (ref 26.0–34.0)
MCHC: 31.3 g/dL (ref 30.0–36.0)
MCV: 96.2 fL (ref 80.0–100.0)
Platelets: 388 10*3/uL (ref 150–400)
RBC: 2.92 MIL/uL — ABNORMAL LOW (ref 4.22–5.81)
RDW: 15 % (ref 11.5–15.5)
WBC: 10.5 10*3/uL (ref 4.0–10.5)
nRBC: 0 % (ref 0.0–0.2)

## 2020-03-13 LAB — GLUCOSE, CAPILLARY
Glucose-Capillary: 149 mg/dL — ABNORMAL HIGH (ref 70–99)
Glucose-Capillary: 111 mg/dL — ABNORMAL HIGH (ref 70–99)
Glucose-Capillary: 170 mg/dL — ABNORMAL HIGH (ref 70–99)
Glucose-Capillary: 147 mg/dL — ABNORMAL HIGH (ref 70–99)

## 2020-03-13 MED ORDER — HYDROMORPHONE HCL 1 MG/ML IJ SOLN
0.5000 mg | INTRAMUSCULAR | Status: DC | PRN
  Administered 2020-03-15 – 2020-03-20 (×5): 0.5 mg via INTRAVENOUS
  Filled 2020-03-13 (×5): qty 0.5

## 2020-03-13 MED ORDER — ACETAMINOPHEN 325 MG PO TABS: 650.0000 mg | ORAL_TABLET | Freq: Four times a day (QID) | ORAL | Status: DC | PRN

## 2020-03-13 MED ORDER — OXYCODONE-ACETAMINOPHEN 5-325 MG PO TABS
1.0000 | ORAL_TABLET | ORAL | Status: DC | PRN
  Administered 2020-03-13 – 2020-03-20 (×25): 1 via ORAL
  Filled 2020-03-13 (×27): qty 1

## 2020-03-13 MED ORDER — DILTIAZEM HCL-DEXTROSE 125-5 MG/125ML-% IV SOLN (PREMIX)
5.0000 mg/h | INTRAVENOUS | Status: DC
  Administered 2020-03-13: 5 mg/h via INTRAVENOUS
  Filled 2020-03-13: qty 125

## 2020-03-13 MED ORDER — METHOCARBAMOL 500 MG PO TABS
500.0000 mg | ORAL_TABLET | Freq: Three times a day (TID) | ORAL | Status: DC | PRN
  Administered 2020-03-13 – 2020-03-20 (×13): 500 mg via ORAL
  Filled 2020-03-13 (×14): qty 1

## 2020-03-13 MED ORDER — METOPROLOL TARTRATE 5 MG/5ML IV SOLN
5.0000 mg | Freq: Once | INTRAVENOUS | Status: AC
  Administered 2020-03-13: 5 mg via INTRAVENOUS
  Filled 2020-03-13: qty 5

## 2020-03-13 NOTE — Progress Notes (Signed)
   Subjective: 4 Days Post-Op Procedure(s) (LRB): INCISION AND DRAINAGE LEFT HIP WITH LINER EXCHANGE (Left) Patient reports pain as mild.   Patient seen in rounds for Dr. Wynelle Link. Patient is well, and has had no acute complaints or problems. Hip feels well, ambulated with therapy yesterday. Denies chest pain or SOB.   Objective: Vital signs in last 24 hours: Temp:  [97.8 F (36.6 C)-98.1 F (36.7 C)] 98.1 F (36.7 C) (12/03 0500) Pulse Rate:  [90-99] 90 (12/03 0500) Resp:  [13-18] 18 (12/03 0500) BP: (146-156)/(62-88) 156/88 (12/03 0500) SpO2:  [100 %] 100 % (12/02 1421) Weight:  [103.6 kg] 103.6 kg (12/03 0500)  Intake/Output from previous day:  Intake/Output Summary (Last 24 hours) at 03/13/2020 0755 Last data filed at 03/13/2020 0502 Gross per 24 hour  Intake 600 ml  Output 800 ml  Net -200 ml    Intake/Output this shift: No intake/output data recorded.  Labs: Recent Labs    03/11/20 0143 03/12/20 0450 03/13/20 0408  HGB 8.9* 8.9* 8.8*   Recent Labs    03/12/20 0450 03/13/20 0408  WBC 11.9* 10.5  RBC 3.00* 2.92*  HCT 28.6* 28.1*  PLT 418* 388   Recent Labs    03/11/20 0143 03/12/20 0450  NA 136 139  K 5.2* 4.9  CL 105 107  CO2 22 25  BUN 58* 47*  CREATININE 1.84* 1.49*  GLUCOSE 223* 163*  CALCIUM 7.8* 8.0*   No results for input(s): LABPT, INR in the last 72 hours.  Exam: General - Patient is Alert and oriented Extremity - Neurologically intact Neurovascular intact Sensation intact distally Dorsiflexion/Plantar flexion intact Dressing/Incision - clean, dry, no drainage Motor Function - intact, moving foot and toes well on exam.   Past Medical History:  Diagnosis Date  . Arthritis   . Asthma    no inhaler  . Coronary artery disease    cardiologist-  dr spruill; last visit 3 mos ago per pt  . GERD (gastroesophageal reflux disease)   . History of MI (myocardial infarction)    1985  . Hydronephrosis, left   . Hypertension   .  Myocardial infarction ([place])   . Nephrolithiasis    left  . 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 Presence of tooth-root and mandibular implants    lower dental implants  . Prostate cancer ([place])   . Shortness of breath    WITH EXERTION  . Type 2 diabetes mellitus (HCC)     Assessment/Plan: 4 Days Post-Op Procedure(s) (LRB): INCISION AND DRAINAGE LEFT HIP WITH LINER EXCHANGE (Left) Active Problems:   OA (osteoarthritis) of hip   Type 2 diabetes mellitus with hypoglycemia without coma (HCC)   Essential hypertension   Hyperlipidemia with target LDL less than 70   Hip pain   Effusion of hip joint, left   Abdominal distension   SBO (small bowel obstruction) (HCC)   Bacteremia  Estimated body mass index is 33.73 kg/m as calculated from the following:   Height as of this encounter: 5\' 9"  (1.753 m).   Weight as of this encounter: 103.6 kg. Up with therapy  DVT Prophylaxis - Lovenox Weight-bearing as tolerated  Hip looks great today, continue working with physical therapy.  Theresa Duty, PA-C Orthopedic Surgery [phone] 03/13/2020, 7:55 AM

## 2020-03-13 NOTE — Consult Note (Signed)
Consultation: UTI, left ureteral stone, ureteral stents Requested by: Dr. Berle Mull  History of Present Illness:   74 yo AAM s/p left ureteral stone passage with urgent bilateral stents with Dr. Tresa Moore 01/15/2020 for ARF,  7 mm left distal stone, small bilat LP stones. Cr improved to 1.16. He was tolerating stents well and AFVSS without dysuria when seen in office 02/24/2020. We've been trying to schedule for bilateral ureteroscopy, laser lithotripsy and stent exchange/removal but pt admitted with bacteremia and left hip infection.  Urine, blood and hip cultures grew Enterobacter.  He continues IV antibiotic therapy.  He is voiding without difficulty.    Past Medical History:  Diagnosis Date  . Arthritis   . Asthma    no inhaler  . Coronary artery disease    cardiologist-  dr spruill; last visit 3 mos ago per pt  . GERD (gastroesophageal reflux disease)   . History of MI (myocardial infarction)    1985  . Hydronephrosis, left   . Hypertension   . Myocardial infarction ([place])   . Nephrolithiasis    left  . 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 Presence of tooth-root and mandibular implants    lower dental implants  . Prostate cancer ([place])   . Shortness of breath    WITH EXERTION  . Type 2 diabetes mellitus ([place])    Past Surgical History:  Procedure Laterality Date  . BUNIONECTOMY    . CYSTOSCOPY W/ RETROGRADES Left 03/14/2014   Procedure: CYSTOSCOPY WITH LEFT RETROGRADE PYELOGRAM, Left Ureteroscopy, [place] ureteral Stent No string;  Surgeon: [name], MD;  [facility];  Service: Urology;  Laterality: Left;  . CYSTOSCOPY W/ URETERAL STENT PLACEMENT Bilateral 01/15/2020   Procedure: CYSTOSCOPY WITH RETROGRADE PYELOGRAM/URETERAL STENT PLACEMENT;  Surgeon: Alexis Frock, MD;  Location: [facility];  Service: Urology;  Laterality: Bilateral;  . HERNIA REPAIR    . INCISION AND DRAINAGE Left 03/09/2020   Procedure: INCISION AND DRAINAGE LEFT  HIP WITH LINER EXCHANGE;  Surgeon: Gaynelle Arabian, MD;  Location: [facility];  Service: Orthopedics;  Laterality: Left;  . IR FLUORO GUIDE CV LINE RIGHT  03/10/2020  . IR US GUIDE VASC ACCESS RIGHT  03/10/2020  . PROSTATE BIOPSY    . TOTAL HIP ARTHROPLASTY Left 03-20-2009  . TOTAL HIP ARTHROPLASTY  04/09/2012   Procedure: TOTAL HIP ARTHROPLASTY;  Surgeon: [name], MD;  Location: [facility];  Service: Orthopedics;  Laterality: Right;  . TOTAL KNEE ARTHROPLASTY Left 05-16-2008    Home Medications:  Medications Prior to Admission  Medication Sig Dispense Refill Last Dose  . albuterol (VENTOLIN HFA) 108 (90 Base) MCG/ACT inhaler Inhale 2 puffs into the lungs every 6 (six) hours as needed for wheezing or shortness of breath. 6.7 g 6 Past Month at Unknown time  . amLODipine (NORVASC) 10 MG tablet Take 10 mg by mouth daily.   03/03/2020 at Unknown time  . Aspirin 81 MG CAPS Take 81 mg by mouth daily.    03/03/2020 at Unknown time  . atorvastatin (LIPITOR) 40 MG tablet Take 1 tablet (40 mg total) by mouth daily. 30 tablet 0 03/03/2020 at Unknown time  . carvedilol (COREG) 25 MG tablet Take 25 mg by mouth 2 (two) times daily with a meal.   03/03/2020 at 12 noon  . co-enzyme Q-10 30 MG capsule Take 30 mg by mouth daily.   03/03/2020 at Unknown time  . cycloSPORINE (RESTASIS) 0.05 % ophthalmic emulsion Place  1 drop into both eyes 2 (two) times daily.    Past Month at Unknown time  . diclofenac Sodium (VOLTAREN) 1 % GEL Apply 4 g topically 4 (four) times daily. 100 g 0 03/03/2020 at Unknown time  . docusate sodium (COLACE) 100 MG capsule Take 100 mg by mouth daily as needed for mild constipation.   02/18/2020  . ferrous sulfate 325 (65 FE) MG tablet Take 325 mg by mouth daily with breakfast.   03/03/2020 at Unknown time  . gabapentin (NEURONTIN) 300 MG capsule Take 1 capsule (300 mg total) by mouth 3 (three) times daily. Start 300mg  at bedtime x1 week then may increase to three times per day. (Patient  taking differently: Take 300 mg by mouth in the morning, at noon, and at bedtime. ) 90 capsule 3 03/03/2020 at Unknown time  . glimepiride (AMARYL) 4 MG tablet Take 4 mg by mouth daily before breakfast.   03/03/2020 at Unknown time  . hydrochlorothiazide (HYDRODIURIL) 25 MG tablet Take 25 mg by mouth daily.   03/03/2020 at Unknown time  . morphine (MSIR) 15 MG tablet Take 0.5 tablets (7.5 mg total) by mouth every 4 (four) hours as needed for severe pain. 5 tablet 0 03/03/2020 at Unknown time  . predniSONE (DELTASONE) 50 MG tablet Take daily until resolution of gout symptoms. (Patient taking differently: Take 50 mg by mouth daily. Take daily until resolution of gout symptoms.) 10 tablet 0 03/03/2020 at Unknown time  . sitaGLIPtin (JANUVIA) 50 MG tablet Take 1 tablet (50 mg total) by mouth daily. 30 tablet 0 03/03/2020 at Unknown time  . spironolactone (ALDACTONE) 25 MG tablet Take 25 mg by mouth daily.   03/03/2020 at Unknown time  . Continuous Blood Gluc Receiver (FREESTYLE LIBRE 14 DAY READER) DEVI See admin instructions.     . Continuous Blood Gluc Sensor (FREESTYLE LIBRE 14 DAY SENSOR) MISC 1 Device by Does not apply route every 14 (fourteen) days. 6 each 1   . omeprazole (PRILOSEC) 40 MG capsule Take 1 capsule (40 mg total) by mouth 2 (two) times daily. X 1 month, then once daily (Patient not taking: Reported on 03/03/2020) 60 capsule 1 Not Taking at Unknown time   Allergies:  Allergies  Allergen Reactions  . Shellfish Allergy Rash    Family History  Problem Relation Age of Onset  . Cancer Mother        breast  . Multiple sclerosis Daughter   . Cancer Father        prostate  . Cancer Maternal Uncle        bone   Social History:  reports that he quit smoking about 35 years ago. His smoking use included cigarettes. He has a 25.00 pack-year smoking history. He quit smokeless tobacco use about 34 years ago. He reports current alcohol use. He reports that he does not use drugs.  ROS: A  complete review of systems was performed.  All systems are negative except for pertinent findings as noted. Review of Systems  All other systems reviewed and are negative.    Physical Exam:  Vital signs in last 24 hours: Temp:  [97.8 F (36.6 C)-98.1 F (36.7 C)] 98.1 F (36.7 C) (12/03 0500) Pulse Rate:  [90-99] 90 (12/03 0500) Resp:  [13-18] 18 (12/03 0500) BP: (146-156)/(62-88) 156/88 (12/03 0500) SpO2:  [100 %] 100 % (12/02 1421) Weight:  [103.6 kg] 103.6 kg (12/03 0500) General:  Alert and oriented, No acute distress HEENT: Normocephalic, atraumatic Cardiovascular: Regular rate and  rhythm Lungs: Regular rate and effort Abdomen: Soft, nontender, mild distention, no abdominal masses Back: No CVA tenderness Extremities: No edema Neurologic: Grossly intact  Laboratory Data:  Results for orders placed or performed during the hospital encounter of 03/03/20 (from the past 24 hour(s))  Glucose, capillary     Status: Abnormal   Collection Time: 03/12/20 12:38 PM  Result Value Ref Range   Glucose-Capillary 138 (H) 70 - 99 mg/dL  Glucose, capillary     Status: Abnormal   Collection Time: 03/12/20  5:15 PM  Result Value Ref Range   Glucose-Capillary 182 (H) 70 - 99 mg/dL  Glucose, capillary     Status: None   Collection Time: 03/12/20  9:53 PM  Result Value Ref Range   Glucose-Capillary 92 70 - 99 mg/dL  CBC     Status: Abnormal   Collection Time: 03/13/20  4:08 AM  Result Value Ref Range   WBC 10.5 4.0 - 10.5 K/uL   RBC 2.92 (L) 4.22 - 5.81 MIL/uL   Hemoglobin 8.8 (L) 13.0 - 17.0 g/dL   HCT 28.1 (L) 39 - 52 %   MCV 96.2 80.0 - 100.0 fL   MCH 30.1 26.0 - 34.0 pg   MCHC 31.3 30.0 - 36.0 g/dL   RDW 15.0 11.5 - 15.5 %   Platelets 388 150 - 400 K/uL   nRBC 0.0 0.0 - 0.2 %  Glucose, capillary     Status: Abnormal   Collection Time: 03/13/20  7:45 AM  Result Value Ref Range   Glucose-Capillary 149 (H) 70 - 99 mg/dL   Recent Results (from the past 240 hour(s))  Culture,  blood (routine x 2)     Status: Abnormal   Collection Time: 03/03/20  7:04 PM   Specimen: BLOOD LEFT HAND  Result Value Ref Range Status   Specimen Description   Final    BLOOD LEFT HAND Performed at [facility], [place] [address] [place], [place] [address]    Special Requests   Final    BOTTLES DRAWN AEROBIC ONLY Blood Culture adequate volume Performed at [facility] [address] [place], [place] [address]    Culture  Setup Time   Final    AEROBIC BOTTLE ONLY GRAM NEGATIVE RODS CRITICAL RESULT CALLED TO, READ BACK BY AND VERIFIED WITH: Ulice Bold PharmD 9:20 03/06/20 ([email or web address]) Performed at [facility], [place] [address] [place], [place] [address]    Culture ENTEROBACTER CLOACAE (A)  Final   Report Status 03/08/2020 FINAL  Final   Organism ID, Bacteria ENTEROBACTER CLOACAE  Final      Susceptibility   Enterobacter cloacae - MIC*    CEFAZOLIN >=64 RESISTANT Resistant     CEFEPIME <=0.12 SENSITIVE Sensitive     CEFTAZIDIME <=1 SENSITIVE Sensitive     CIPROFLOXACIN <=0.25 SENSITIVE Sensitive     GENTAMICIN <=1 SENSITIVE Sensitive     IMIPENEM <=0.25 SENSITIVE Sensitive     TRIMETH/SULFA <=20 SENSITIVE Sensitive     PIP/TAZO <=4 SENSITIVE Sensitive     * ENTEROBACTER CLOACAE  Blood Culture ID Panel (Reflexed)     Status: Abnormal   Collection Time: 03/03/20  7:04 PM  Result Value Ref Range Status   Enterococcus faecalis NOT DETECTED NOT DETECTED Final   Enterococcus Faecium NOT DETECTED NOT DETECTED Final   Listeria monocytogenes NOT DETECTED NOT DETECTED Final   Staphylococcus species NOT DETECTED NOT DETECTED Final   Staphylococcus aureus (BCID) NOT DETECTED NOT DETECTED Final  Staphylococcus epidermidis NOT DETECTED NOT DETECTED Final   Staphylococcus lugdunensis NOT DETECTED NOT DETECTED Final   Streptococcus species NOT DETECTED NOT DETECTED Final   Streptococcus agalactiae NOT DETECTED NOT DETECTED Final   Streptococcus  pneumoniae NOT DETECTED NOT DETECTED Final   Streptococcus pyogenes NOT DETECTED NOT DETECTED Final   A.calcoaceticus-baumannii NOT DETECTED NOT DETECTED Final   Bacteroides fragilis NOT DETECTED NOT DETECTED Final   Enterobacterales DETECTED (A) NOT DETECTED Final    Comment: Enterobacterales represent a large order of gram negative bacteria, not a single organism. CRITICAL RESULT CALLED TO, READ BACK BY AND VERIFIED WITH: Ulice Bold PharmD 9:20 03/06/20 ([email or web address])    Enterobacter cloacae complex DETECTED (A) NOT DETECTED Final    Comment: CRITICAL RESULT CALLED TO, READ BACK BY AND VERIFIED WITH: Ulice Bold PharmD 9:20 03/06/20 ([email or web address])    Escherichia coli NOT DETECTED NOT DETECTED Final   Klebsiella aerogenes NOT DETECTED NOT DETECTED Final   Klebsiella oxytoca NOT DETECTED NOT DETECTED Final   Klebsiella pneumoniae NOT DETECTED NOT DETECTED Final   Proteus species NOT DETECTED NOT DETECTED Final   Salmonella species NOT DETECTED NOT DETECTED Final   Serratia marcescens NOT DETECTED NOT DETECTED Final   Haemophilus influenzae NOT DETECTED NOT DETECTED Final   Neisseria meningitidis NOT DETECTED NOT DETECTED Final   Pseudomonas aeruginosa NOT DETECTED NOT DETECTED Final   Stenotrophomonas maltophilia NOT DETECTED NOT DETECTED Final   Candida albicans NOT DETECTED NOT DETECTED Final   Candida auris NOT DETECTED NOT DETECTED Final   Candida glabrata NOT DETECTED NOT DETECTED Final   Candida krusei NOT DETECTED NOT DETECTED Final   Candida parapsilosis NOT DETECTED NOT DETECTED Final   Candida tropicalis NOT DETECTED NOT DETECTED Final   Cryptococcus neoformans/gattii NOT DETECTED NOT DETECTED Final   CTX-M ESBL NOT DETECTED NOT DETECTED Final   Carbapenem resistance IMP NOT DETECTED NOT DETECTED Final   Carbapenem resistance KPC NOT DETECTED NOT DETECTED Final   Carbapenem resistance NDM NOT DETECTED NOT DETECTED Final   Carbapenem resist OXA 48 LIKE NOT DETECTED NOT DETECTED Final    Carbapenem resistance VIM NOT DETECTED NOT DETECTED Final    Comment: Performed at [facility], [address], [place], [place] [address]  Culture, blood (routine x 2)     Status: None   Collection Time: 03/03/20  7:06 PM   Specimen: BLOOD RIGHT HAND  Result Value Ref Range Status   Specimen Description   Final    BLOOD RIGHT HAND Performed at [place] [address], [place], [place] [address]    Special Requests   Final    BOTTLES DRAWN AEROBIC ONLY Blood Culture adequate volume Performed at [place] [address] [place], [place] [address]    Culture   Final    NO GROWTH 5 DAYS Performed at [facility], [place] [address], [place], [place] [address]    Report Status 03/08/2020 FINAL  Final  Aerobic/Anaerobic Culture (surgical/deep wound)     Status: None   Collection Time: 03/04/20  3:38 PM   Specimen: Abscess  Result Value Ref Range Status   Specimen Description   Final    ABSCESS DRAIN LEFT LATERAL THIGH Performed at [place] [address], [place], [place] [address]    Special Requests   Final    NONE Performed at [facility], [place] [address] [place], [place] [address]    Gram Stain   Final  ABUNDANT WBC PRESENT,BOTH PMN AND MONONUCLEAR ABUNDANT GRAM NEGATIVE RODS    Culture   Final    ABUNDANT ENTEROBACTER CLOACAE NO ANAEROBES ISOLATED Performed at [facility], [address], [place], [place] [address]    Report Status 03/09/2020 FINAL  Final   Organism ID, Bacteria ENTEROBACTER CLOACAE  Final      Susceptibility   Enterobacter cloacae - MIC*    CEFAZOLIN >=64 RESISTANT Resistant     CEFEPIME <=0.12 SENSITIVE Sensitive     CEFTAZIDIME <=1 SENSITIVE Sensitive     CIPROFLOXACIN <=0.25 SENSITIVE Sensitive     GENTAMICIN <=1 SENSITIVE Sensitive     IMIPENEM 0.5 SENSITIVE Sensitive     TRIMETH/SULFA <=20 SENSITIVE Sensitive     PIP/TAZO <=4 SENSITIVE  Sensitive     * ABUNDANT ENTEROBACTER CLOACAE  Culture, Urine     Status: Abnormal   Collection Time: 03/05/20  8:10 AM   Specimen: Urine, Random  Result Value Ref Range Status   Specimen Description   Final    URINE, RANDOM Performed at [facility], [place] [address] [place], [place] [address]    Special Requests   Final    NONE Performed at [facility], [place] [address], [place], [place] [address]    Culture >=100,000 COLONIES/mL ENTEROBACTER SPECIES (A)  Final   Report Status 03/07/2020 FINAL  Final   Organism ID, Bacteria ENTEROBACTER SPECIES (A)  Final      Susceptibility   Enterobacter species - MIC*    CEFAZOLIN >=64 RESISTANT Resistant     CEFEPIME <=0.12 SENSITIVE Sensitive     CEFTRIAXONE <=0.25 SENSITIVE Sensitive     CIPROFLOXACIN <=0.25 SENSITIVE Sensitive     GENTAMICIN <=1 SENSITIVE Sensitive     IMIPENEM 0.5 SENSITIVE Sensitive     NITROFURANTOIN 32 SENSITIVE Sensitive     TRIMETH/SULFA <=20 SENSITIVE Sensitive     PIP/TAZO <=4 SENSITIVE Sensitive     * >=100,000 COLONIES/mL ENTEROBACTER SPECIES  Culture, blood (Routine X 2) w Reflex to ID Panel     Status: None (Preliminary result)   Collection Time: 03/09/20 12:50 PM   Specimen: BLOOD  Result Value Ref Range Status   Specimen Description   Final    BLOOD RIGHT WRIST Performed at [facility] [address], [place], [place] [address]    Special Requests   Final    BOTTLES DRAWN AEROBIC ONLY Blood Culture adequate volume Performed at [facility] [address] [place], [place] [address]    Culture   Final    NO GROWTH 4 DAYS Performed at [facility], [place] [address] [place], [place] [address]    Report Status PENDING  Incomplete  Culture, blood (Routine X 2) w Reflex to ID Panel     Status: None (Preliminary result)   Collection Time: 03/09/20 12:50 PM   Specimen: BLOOD RIGHT HAND  Result Value Ref Range Status    Specimen Description   Final    BLOOD RIGHT HAND Performed at [facility] [address] [place], [place] [address]    Special Requests   Final    BOTTLES DRAWN AEROBIC AND ANAEROBIC Blood Culture adequate volume Performed at [facility] [address] [place], [place] [address]    Culture   Final    NO GROWTH 4 DAYS Performed at [facility], [place] [address], [place], [place] [address]    Report Status PENDING  Incomplete  Aerobic/Anaerobic Culture (surgical/deep wound)     Status: None (Preliminary result)   Collection Time: 03/09/20  3:53 PM   Specimen: PATH Other; Tissue  Result Value Ref Range Status   Specimen Description   Final    ABSCESS RIGHT HIP Performed at [facility] [address] [place], [place] [address]    Special Requests   Final    NONE Performed at [facility], [place] [address] [place], [place] [address]    Gram Stain   Final    FEW WBC PRESENT,BOTH PMN AND MONONUCLEAR FEW GRAM NEGATIVE RODS RARE GRAM POSITIVE COCCI Performed at [facility], [place] [address], [place], [place] [address]    Culture   Final    FEW ENTEROBACTER CLOACAE NO ANAEROBES ISOLATED; CULTURE IN PROGRESS FOR 5 DAYS    Report Status PENDING  Incomplete   Organism ID, Bacteria ENTEROBACTER CLOACAE  Final      Susceptibility   Enterobacter cloacae - MIC*    CEFAZOLIN >=64 RESISTANT Resistant     CEFEPIME <=0.12 SENSITIVE Sensitive     CEFTAZIDIME <=1 SENSITIVE Sensitive     CIPROFLOXACIN <=0.25 SENSITIVE Sensitive     GENTAMICIN <=1 SENSITIVE Sensitive     IMIPENEM 0.5 SENSITIVE Sensitive     TRIMETH/SULFA <=20 SENSITIVE Sensitive     PIP/TAZO <=4 SENSITIVE Sensitive     * FEW ENTEROBACTER CLOACAE   Creatinine: Recent Labs    03/07/20 0659 03/08/20 0610 03/09/20 0419 03/10/20 0633 03/11/20 0143 03/12/20 0450  CREATININE 2.55* 1.82* 1.44* 1.69* 1.84* 1.49*   I reviewed Oct  2021 CT images,  Nov 2021 CT and kub x 2.   Impression/Assessment/plan:  Bilateral ureteral stents, left ureteral stone with Enterobacter UTI, bacteremia and infected hip-discussed with the patient his ureteral stents have been in place since early October and will be a nidus for continued infection.  He has been on antibiotics for over 10 days and if we plan ureteroscopy for next Tuesday he will have been on antibiotics for about 2 weeks.  I think it would be safe to at that point perform and we discussed the nature, potential benefits, risks and alternatives to cystoscopy, right retrograde pyelogram with stent removal, left ureteroscopy with laser lithotripsy of the distal stone, stent exchange and foley (temporary for a few days post-op), including side effects of the proposed treatment, the likelihood of the patient achieving the goals of the procedure, and any potential problems that might occur during the procedure or recuperation. Given the renal stones it may just be Randall's plaques and with his current issues I think it would be a good idea not to perform ureteroscopy up into the collecting systems. All questions answered. Pt elects to proceed.   Festus Aloe 03/13/2020, 7:51 AM

## 2020-03-13 NOTE — Progress Notes (Signed)
Physical Therapy Treatment Patient Details Name: [name] MRN: [id number] DOB: 1946/03/19 Today's Date: 03/13/2020    History of Present Illness Pt is 74 yo male admitted to ED from [facility] with L hip pain and fluid collection/septic arthritis. He is S/P I&D and bearing surface exchange 03/09/20 of L posterior THA. Pt found to have SBO vs ileus - surgery believes is ileus on 03/12/20 - per surgery mobilize as tolerated. Hx of R THA 2013, L THA 2001, DM, CAD, CKD, anemia    PT Comments    Pt motivated and making progress - but some limitations in OOB activity/walking due to elevated HR.  Pt is on cardizem drip with HR 107-115 bpm rest but up to 150's with ambulation requiring return to supine to recover to resting level.  Required min-mod A throughout session with cues for hip precautions , posture, and transfer technique.  Good tolerance for exercises.  Continue plan of care.    Follow Up Recommendations  SNF     Equipment Recommendations  None recommended by PT    Recommendations for Other Services       Precautions / Restrictions Precautions Precautions: Fall;Posterior Hip Precaution Comments: Reviewed hip precautions (pt recalled 2 or 3) Restrictions Other Position/Activity Restrictions: WBAT    Mobility  Bed Mobility Overal bed mobility: Needs Assistance Bed Mobility: Sit to Supine       Sit to supine: Min assist   General bed mobility comments: Assist for bil LE back to bed; cues for hip precautions  Transfers Overall transfer level: Needs assistance Equipment used: Rolling walker (2 wheeled) Transfers: Sit to/from Stand Sit to Stand: Mod assist         General transfer comment: Performed x 2 with cues for safe hand placement and hip precautions; Mod A to boost up and steady  Ambulation/Gait Ambulation/Gait assistance: Min assist Gait Distance (Feet): 15 Feet (15' then 4') Assistive device: Rolling walker (2 wheeled) Gait Pattern/deviations:  Step-to pattern;Decreased stride length;Shuffle;Decreased weight shift to left;Trunk flexed Gait velocity: decreased   General Gait Details: Cues for RW proximity, posture, and safe turns.  HR increased as high as 157 bpm.  HR not recovering with 5 min rest break so had pt just ambulate 4' back to bed.   Stairs             Wheelchair Mobility    Modified Rankin (Stroke Patients Only)       Balance Overall balance assessment: Needs assistance Sitting-balance support: No upper extremity supported;Feet supported Sitting balance-Leahy Scale: Good     Standing balance support: Bilateral upper extremity supported;During functional activity Standing balance-Leahy Scale: Poor Standing balance comment: required RW                            Cognition Arousal/Alertness: Awake/alert Behavior During Therapy: WFL for tasks assessed/performed Overall Cognitive Status: Within Functional Limits for tasks assessed                                 General Comments: Requires cues for hip precautions      Exercises General Exercises - Lower Extremity Ankle Circles/Pumps: AROM;Both;20 reps;Seated Quad Sets: AROM;Both;20 reps;Supine Short Arc Quad: AROM;Left;Supine (10x2) Heel Slides: AAROM;Left;Supine (10x2) Hip ABduction/ADduction: AAROM;Left;Supine (10x2)    General Comments General comments (skin integrity, edema, etc.): HR 107-115 bpm rest.  Up to 150's with ambulation.  HR not recovering  with seated rest break so had pt ambulate short distance to bed and returned to supine.  HR then began to return to 110's with 2-3 mins rest.      Pertinent Vitals/Pain Pain Assessment: Faces Faces Pain Scale: Hurts little more Pain Location: L hip Pain Descriptors / Indicators: Sore Pain Intervention(s): Limited activity within patient's tolerance;Monitored during session;Ice applied;Repositioned;Patient requesting pain meds-RN notified    Home Living                       Prior Function            PT Goals (current goals can now be found in the care plan section) Acute Rehab PT Goals Patient Stated Goal: regain independence PT Goal Formulation: With patient Time For Goal Achievement: 03/18/20 Potential to Achieve Goals: Good Progress towards PT goals: Progressing toward goals    Frequency    Min 3X/week      PT Plan Current plan remains appropriate    Co-evaluation              AM-PAC PT "6 Clicks" Mobility   Outcome Measure  Help needed turning from your back to your side while in a flat bed without using bedrails?: A Lot Help needed moving from lying on your back to sitting on the side of a flat bed without using bedrails?: A Lot Help needed moving to and from a bed to a chair (including a wheelchair)?: A Lot Help needed standing up from a chair using your arms (e.g., wheelchair or bedside chair)?: A Lot Help needed to walk in hospital room?: A Little Help needed climbing 3-5 steps with a railing? : A Lot 6 Click Score: 13    End of Session Equipment Utilized During Treatment: Gait belt Activity Tolerance: Patient tolerated treatment well Patient left: with call bell/phone within reach;in bed;with bed alarm set Nurse Communication: Mobility status;Patient requests pain meds PT Visit Diagnosis: Muscle weakness (generalized) (M62.81);Other abnormalities of gait and mobility (R26.89);Pain Pain - Right/Left: Left Pain - part of body: Hip     Time: 6256-3893 PT Time Calculation (min) (ACUTE ONLY): 32 min  Charges:  $Gait Training: 8-22 mins $Therapeutic Exercise: 8-22 mins                     Abran Richard, PT Acute Rehab Services Pager [phone] [facility] 03/13/2020, 4:10 PM

## 2020-03-13 NOTE — Progress Notes (Signed)
PROGRESS NOTE    [name]  TMA:[id number] DOB: 06-21-1945 DOA: 03/03/2020 PCP: Luetta Nutting, DO    Brief Narrative:  [name] is a 74 y.o. male with medical history significant of coronary artery disease, diabetes type 2, hypertension, anemia, CKD stage IIIb, osteoarthritis status post left hip arthroplasty, renal stone/hydronephrosis status post stents, prostate cancer ,paroxysmal atrial flutter not on anticoagulation who was sent for direct admission [facility] from [facility],Dr Aluisio.  Patient was seen in the emergency department on 03/01/2020 for left hip pain.  There was no report of injury.  He had CT left hip done which showed  fluid collection.  He was found to have elevated ESR and CRP.  He was discharged from the emergency department to follow-up with orthopedics as per orthopedics recommendation.    Assessment & Plan: Enterobacter Cloacae Bacteremia  Emphysematous Pyelitis  Left Hip Septic Arthritis/Prosthetic Joint Infection CT left hip showed large fluid and air containing collection surroudning the proximal femoral component measuring up to 8.7 cm, concerning for infection/abscess.  Fluid and air collection track within the L adductor muscle compartment extending towards the symphysis.  Periprosthetic lucency along the posterior cortex of the proximal femoral stem extending distally by approximately 7.5 cm, concerning for septic loosening.  Area of lucency along the anterior aspect of the proximal native femur with air seen within bone, concerning for osteo. S/p CT guided placed 10 F drainage catheter placement  Culture with abundant enterobacter cloacae (resistant to cefazolin) Blood culture with 1 of 2 sets positive for enterobacter cloacae (resistant to cefazolin) Urine cx with enterobacter species (resistant to cefazolin) Repeat blood cx NG < 24 hr 11/29 Ortho s/p I&D L hip with liner exchange on 11/29 -> cx with few gram  negative rods and rare gram positive cocci  ID consult, appreciate recs -> recommending transition to cefepime.  Repeat blood cx 11/29 pending.  S/p tunneled line on 11/30 by IR.  Will need 6 weeks IV abx, followed by oral abx   Partial SBO vs Ileus: s/p NG tube placement for diffusely gas distended bowel Follow CT abdomen pelvis -> tiny richter hernia containing a very short loop of small bowel with an associated early/partial SBO. KUB 11/26 with mildly dilated small bowel loops Surgery c/s, appreciate recs - suspects this was ileus 2/2 medical issues that appears to be resolving We will advance diet.  Monitor.  Paroxysmal A. fib/atrial flutter: Follows with Dr [name].  Not on any anticoagulation at home because of anemia. chadvasc at leat 3. Rate controlled  Appreciate cardiology assistance - amiodarone, continue IV heparin and cardizem per cards for now Due to RVR resume Cardizem drip on 12/3. Continue rest of the medications. Follow repeat echo - EF 55-60%, no RWMA (see report)  Diabetes type 2:Hemoglobin A1c of 7.1 as per 9/21. SSI - hold home amaryl and januvia Adjust prn  AKI on CKD stage IIIb  Anion Gap Metabolic Acidosis: His kidney function has fluctuated over several months from 1.5-3, unclear baseline.  Continue to monitor kidney function.  Avoid nephrotoxins.  He needs to follow-up with nephrology as an outpatient. Creatinine up to 2.91 on 11/25, continue IVF Acidosis resolved  History of severe left hydroureteronephrosis/prostate cancer: Follows with urology.  He is status post bilateral ureteral stent placement by Dr. Tresa Moore 10/6.  Currently prostate cancer is in remission. Patient will undergo stent exchange next Tuesday as that can be a nidus for infection as well.  Normocytic anemia: Most  likely associated  with chronic kidney disease, chronic medical problems.  Hemoglobin currently stable. No report of hematochezia or melena.  Coronary artery disease: Takes  aspirin.  continue to hold in preparation for procedure. Denies any chest pain.    Hypertension:  continue dilt and metop as noted above  Hyperlipidemia: Takes Lipitor.  GERD: Continue Protonix  Left hip pain/debility:  PT recommends home with SNF  DVT prophylaxis: heparin Code Status: full  Family Communication: shelby at bedside Disposition:   Status is: inpatient  The patient will require care spanning > 2 midnights and should be moved to inpatient because: Inpatient level of care appropriate due to severity of illness  Dispo: The patient is from: Home              Anticipated d/c is to: pending              Anticipated d/c date is: > 3 days              Patient currently is not medically stable to d/c.   Consultants:   IR  Ortho  Procedures: IR IMPRESSION: Successful CT guided placement of a 12 French all purpose drain catheter into the complex fluid collection involving the proximal lateral aspect the left thigh with aspiration of 50 cc of purulent fluid. Samples were sent to the laboratory as requested by the ordering clinical team.  Echo IMPRESSIONS    1. Left ventricular ejection fraction, by estimation, is 55 to 60%. The  left ventricle has normal function. The left ventricle has no regional  wall motion abnormalities. There is mild concentric left ventricular  hypertrophy. Left ventricular diastolic  function could not be evaluated.  2. Right ventricular systolic function is normal. The right ventricular  size is normal.  3. Left atrial size was mildly dilated.  4. The pericardial effusion is circumferential.  5. The mitral valve is normal in structure. Mild mitral valve  regurgitation. No evidence of mitral stenosis.  6. The aortic valve is normal in structure. Aortic valve regurgitation is  not visualized. Mild aortic valve sclerosis is present, with no evidence  of aortic valve stenosis.  7. The inferior vena cava is normal in size  with greater than 50%  respiratory variability, suggesting right atrial pressure of 3 mmHg.   Comparison(s): No significant change from prior study. Prior images  reviewed side by side.  Antimicrobials: Anti-infectives (From admission, onward)   Start     Dose/Rate Route Frequency Ordered Stop   03/06/20 1700  ceFEPIme (MAXIPIME) 2 g in sodium chloride 0.9 % 100 mL IVPB        2 g 200 mL/hr over 30 Minutes Intravenous Every 12 hours 03/06/20 1340     03/05/20 1000  piperacillin-tazobactam (ZOSYN) IVPB 3.375 g  Status:  Discontinued        3.375 g 12.5 mL/hr over 240 Minutes Intravenous Every 8 hours 03/05/20 0904 03/06/20 1311     Subjective: No acute complaint.  Continues to have bowel movement.  No nausea no vomiting.  Oral intake is adequate.  Objective: Vitals:   03/12/20 1421 03/12/20 2104 03/13/20 0500 03/13/20 1321  BP: (!) 147/62 (!) 146/79 (!) 156/88 135/85  Pulse: 99  90 100  Resp: '13  18 13  ' Temp: 97.8 F (36.6 C) 98 F (36.7 C) 98.1 F (36.7 C) 97.7 F (36.5 C)  TempSrc:  Oral Oral Oral  SpO2: 100%     Weight:   103.6 kg   Height:  Intake/Output Summary (Last 24 hours) at 03/13/2020 1932 Last data filed at 03/13/2020 0502 Gross per 24 hour  Intake 600 ml  Output 800 ml  Net -200 ml   Filed Weights   03/11/20 0528 03/12/20 0447 03/13/20 0500  Weight: 104.3 kg 103.8 kg 103.6 kg    Examination:  General: Appear in mild distress, no Rash; Oral Mucosa Clear, moist. no Abnormal Neck Mass Or lumps, Conjunctiva normal  Cardiovascular: S1 and S2 Present, no Murmur, Respiratory: good respiratory effort, Bilateral Air entry present and CTA, no Crackles, no wheezes Abdomen: Bowel Sound present, Soft and distended, no tenderness Extremities: Left leg edema, no tenderness Neurology: alert and oriented to time, place, and person affect appropriate. no new focal deficit Gait not checked due to patient safety concerns    Data Reviewed: I have personally  reviewed following labs and imaging studies  CBC: Recent Labs  Lab 03/07/20 0659 03/07/20 0659 03/08/20 0610 03/08/20 0610 03/09/20 0419 03/10/20 0633 03/11/20 0143 03/12/20 0450 03/13/20 0408  WBC 7.9   < > 7.3   < > 7.6 13.3* 11.9* 11.9* 10.5  NEUTROABS 6.4  --  5.8  --  6.0  --   --   --   --   HGB 9.4*   < > 8.8*   < > 9.0* 10.7* 8.9* 8.9* 8.8*  HCT 29.2*   < > 28.4*   < > 28.6* 32.8* 28.3* 28.6* 28.1*  MCV 94.2   < > 96.3   < > 94.7 92.1 94.6 95.3 96.2  PLT 573*   < > 467*   < > 500* 449* 399 418* 388   < > = values in this interval not displayed.    Basic Metabolic Panel: Recent Labs  Lab 03/07/20 0659 03/07/20 0659 03/08/20 0610 03/09/20 0419 03/10/20 0633 03/11/20 0143 03/12/20 0450  NA 140   < > 140 138 138 136 139  K 3.9   < > 4.1 4.4 5.1 5.2* 4.9  CL 102   < > 101 101 104 105 107  CO2 25   < > '26 25 22 22 25  ' GLUCOSE 241*   < > 158* 148* 194* 223* 163*  BUN 131*   < > 87* 57* 51* 58* 47*  CREATININE 2.55*   < > 1.82* 1.44* 1.69* 1.84* 1.49*  CALCIUM 8.0*   < > 8.2* 8.3* 8.1* 7.8* 8.0*  MG 3.1*  --  2.8* 2.3  --   --  2.0  PHOS 5.0*  --  3.4 2.7  --   --   --    < > = values in this interval not displayed.    GFR: Estimated Creatinine Clearance: 51.6 mL/min (A) (by C-G formula based on SCr of 1.49 mg/dL (H)).  Liver Function Tests: Recent Labs  Lab 03/07/20 0659 03/08/20 0610 03/09/20 0419 03/10/20 0633 03/12/20 0450  AST 16 22 44* 76* 35  ALT 14 17 33 67* 49*  ALKPHOS 116 101 94 97 83  BILITOT 0.6 0.5 0.6 0.9 0.5  PROT 7.4 7.3 7.1 6.9 6.0*  ALBUMIN 2.4* 2.4* 2.3* 2.4* 2.3*    CBG: Recent Labs  Lab 03/12/20 1715 03/12/20 2153 03/13/20 0745 03/13/20 1154 03/13/20 1625  GLUCAP 182* 92 149* 111* 170*     Recent Results (from the past 240 hour(s))  Aerobic/Anaerobic Culture (surgical/deep wound)     Status: None   Collection Time: 03/04/20  3:38 PM   Specimen: Abscess  Result Value Ref Range Status  Specimen Description   Final      ABSCESS DRAIN LEFT LATERAL THIGH Performed at [facility] [address], [place], [place] [address]    Special Requests   Final    NONE Performed at [facility], [place] [address], [place], [place] [address]    Gram Stain   Final    ABUNDANT WBC PRESENT,BOTH PMN AND MONONUCLEAR ABUNDANT GRAM NEGATIVE RODS    Culture   Final    ABUNDANT ENTEROBACTER CLOACAE NO ANAEROBES ISOLATED Performed at [facility], [place] [address] [place], [place] [address]    Report Status 03/09/2020 FINAL  Final   Organism ID, Bacteria ENTEROBACTER CLOACAE  Final      Susceptibility   Enterobacter cloacae - MIC*    CEFAZOLIN >=64 RESISTANT Resistant     CEFEPIME <=0.12 SENSITIVE Sensitive     CEFTAZIDIME <=1 SENSITIVE Sensitive     CIPROFLOXACIN <=0.25 SENSITIVE Sensitive     GENTAMICIN <=1 SENSITIVE Sensitive     IMIPENEM 0.5 SENSITIVE Sensitive     TRIMETH/SULFA <=20 SENSITIVE Sensitive     PIP/TAZO <=4 SENSITIVE Sensitive     * ABUNDANT ENTEROBACTER CLOACAE  Culture, Urine     Status: Abnormal   Collection Time: 03/05/20  8:10 AM   Specimen: Urine, Random  Result Value Ref Range Status   Specimen Description   Final    URINE, RANDOM Performed at [facility], [place] [address], [place], [place] [address]    Special Requests   Final    NONE Performed at [facility], [place] [address] [place], [place] [address]    Culture >=100,000 COLONIES/mL ENTEROBACTER SPECIES (A)  Final   Report Status 03/07/2020 FINAL  Final   Organism ID, Bacteria ENTEROBACTER SPECIES (A)  Final      Susceptibility   Enterobacter species - MIC*    CEFAZOLIN >=64 RESISTANT Resistant     CEFEPIME <=0.12 SENSITIVE Sensitive     CEFTRIAXONE <=0.25 SENSITIVE Sensitive     CIPROFLOXACIN <=0.25 SENSITIVE Sensitive     GENTAMICIN <=1 SENSITIVE Sensitive     IMIPENEM 0.5 SENSITIVE Sensitive     NITROFURANTOIN 32 SENSITIVE Sensitive      TRIMETH/SULFA <=20 SENSITIVE Sensitive     PIP/TAZO <=4 SENSITIVE Sensitive     * >=100,000 COLONIES/mL ENTEROBACTER SPECIES  Culture, blood (Routine X 2) w Reflex to ID Panel     Status: None (Preliminary result)   Collection Time: 03/09/20 12:50 PM   Specimen: BLOOD  Result Value Ref Range Status   Specimen Description   Final    BLOOD RIGHT WRIST Performed at [facility] [address] [place], [place] [address]    Special Requests   Final    BOTTLES DRAWN AEROBIC ONLY Blood Culture adequate volume Performed at [facility] [address], [place], [place] [address]    Culture   Final    NO GROWTH 4 DAYS Performed at [facility], [place] [address] [place], [place] [address]    Report Status PENDING  Incomplete  Culture, blood (Routine X 2) w Reflex to ID Panel     Status: None (Preliminary result)   Collection Time: 03/09/20 12:50 PM   Specimen: BLOOD RIGHT HAND  Result Value Ref Range Status   Specimen Description   Final    BLOOD RIGHT HAND Performed at [facility] [address] [place] [address], [place],  [address]  Special Requests   Final    BOTTLES DRAWN AEROBIC AND ANAEROBIC Blood Culture adequate volume Performed at [facility] [address] [place], [place] [address]    Culture   Final    NO GROWTH 4 DAYS Performed at [facility], [place] [address], [place], [place] [address]    Report Status PENDING  Incomplete  Aerobic/Anaerobic Culture (surgical/deep wound)     Status: None (Preliminary result)   Collection Time: 03/09/20  3:53 PM   Specimen: PATH Other; Tissue  Result Value Ref Range Status   Specimen Description   Final    ABSCESS RIGHT HIP Performed at [facility] [address], [place], [place] [address]    Special Requests   Final    NONE Performed at [facility], [place] [address], [place], [place] [address]    Gram Stain    Final    FEW WBC PRESENT,BOTH PMN AND MONONUCLEAR FEW GRAM NEGATIVE RODS RARE GRAM POSITIVE COCCI Performed at [facility], [place] [address], [place],  [address]    Culture   Final    FEW ENTEROBACTER CLOACAE NO ANAEROBES ISOLATED; CULTURE IN PROGRESS FOR 5 DAYS    Report Status PENDING  Incomplete   Organism ID, Bacteria ENTEROBACTER CLOACAE  Final      Susceptibility   Enterobacter cloacae - MIC*    CEFAZOLIN >=64 RESISTANT Resistant     CEFEPIME <=0.12 SENSITIVE Sensitive     CEFTAZIDIME <=1 SENSITIVE Sensitive     CIPROFLOXACIN <=0.25 SENSITIVE Sensitive     GENTAMICIN <=1 SENSITIVE Sensitive     IMIPENEM 0.5 SENSITIVE Sensitive     TRIMETH/SULFA <=20 SENSITIVE Sensitive     PIP/TAZO <=4 SENSITIVE Sensitive     * FEW ENTEROBACTER CLOACAE         Radiology Studies: DG Abd 1 View  Result Date: 03/13/2020 CLINICAL DATA:  Ileus. EXAM: ABDOMEN - 1 VIEW COMPARISON:  03/11/2020.  CT 03/04/2020. FINDINGS: Bilateral ureteral stents again noted in stable position. Persistent small-bowel distention without interim change. Colon is nondistended. No free air. Degenerative change lumbar spine. Surgical clips noted the pelvis. Bilateral hip replacements. IMPRESSION: 1.  Bilateral ureteral stents again noted in stable position. 2. Persistent small-bowel distention consistent with small bowel obstruction. No interim change. Colon is nondistended. No free air. Electronically Signed   By: Marcello Moores  Register   On: 03/13/2020 06:51        Scheduled Meds: . amiodarone  200 mg Oral Daily  . atorvastatin  40 mg Oral Daily  . Chlorhexidine Gluconate Cloth  6 each Topical Daily  . enoxaparin (LOVENOX) injection  100 mg Subcutaneous BID  . ferrous sulfate  325 mg Oral Q breakfast  . gabapentin  300 mg Oral TID  . insulin aspart  0-15 Units Subcutaneous TID WC  . insulin aspart  0-5 Units Subcutaneous QHS  . lip balm  1 application Topical BID  . metoprolol tartrate  25 mg Oral  BID  . pantoprazole  40 mg Oral Daily  . simethicone  80 mg Oral QID  . sodium chloride flush  10-40 mL Intracatheter Q12H  . sodium chloride flush  5 mL Intracatheter Q8H   Continuous Infusions: . sodium chloride 75 mL/hr at 03/13/20 1052  . ceFEPime (MAXIPIME) IV 2 g (03/13/20 1721)  . diltiazem (CARDIZEM) infusion 5 mg/hr (03/13/20 1343)     LOS: 9 days    Time spent: over 30 min  Berle Mull, MD [facility]   To contact the attending provider between 7A-7P or the covering provider during after hours 7P-7A, please log into the web site [email or web address] and access using universal [place] password for that web site. If you do not have the password, please call the hospital operator.  03/13/2020, 7:32 PM

## 2020-03-13 NOTE — Progress Notes (Signed)
4 Days Post-Op  Subjective: Doing well today.  Feels well.  No nausea with CLD, passing flatus and BMs.  No abdominal pain.  Feels less bloated  ROS: See above, otherwise other systems negative  Objective: Vital signs in last 24 hours: Temp:  [97.8 F (36.6 C)-98.1 F (36.7 C)] 98.1 F (36.7 C) (12/03 0500) Pulse Rate:  [90-99] 90 (12/03 0500) Resp:  [13-18] 18 (12/03 0500) BP: (146-156)/(62-88) 156/88 (12/03 0500) SpO2:  [100 %] 100 % (12/02 1421) Weight:  [103.6 kg] 103.6 kg (12/03 0500) Last BM Date: 04/04/20  Intake/Output from previous day: 12/02 0701 - 12/03 0700 In: 600 [P.O.:600] Out: 800 [Urine:800] Intake/Output this shift: No intake/output data recorded.  PE: Abd: soft, less distended today, but still with some bloating, +BS, NT  Lab Results:  Recent Labs    03/12/20 0450 03/13/20 0408  WBC 11.9* 10.5  HGB 8.9* 8.8*  HCT 28.6* 28.1*  PLT 418* 388   BMET Recent Labs    03/11/20 0143 03/12/20 0450  NA 136 139  K 5.2* 4.9  CL 105 107  CO2 22 25  GLUCOSE 223* 163*  BUN 58* 47*  CREATININE 1.84* 1.49*  CALCIUM 7.8* 8.0*   PT/INR No results for input(s): LABPROT, INR in the last 72 hours. CMP     Component Value Date/Time   NA 139 03/12/2020 0450   NA 138 10/01/2019 0803   K 4.9 03/12/2020 0450   CL 107 03/12/2020 0450   CO2 25 03/12/2020 0450   GLUCOSE 163 (H) 03/12/2020 0450   BUN 47 (H) 03/12/2020 0450   BUN 27 (A) 10/01/2019 0803   CREATININE 1.49 (H) 03/12/2020 0450   CREATININE 1.63 (H) 01/21/2020 0953   CALCIUM 8.0 (L) 03/12/2020 0450   PROT 6.0 (L) 03/12/2020 0450   ALBUMIN 2.3 (L) 03/12/2020 0450   AST 35 03/12/2020 0450   ALT 49 (H) 03/12/2020 0450   ALKPHOS 83 03/12/2020 0450   BILITOT 0.5 03/12/2020 0450   GFRNONAA 49 (L) 03/12/2020 0450   GFRNONAA 17 (L) 01/13/2020 0716   GFRAA 20 (L) 01/13/2020 0716   Lipase     Component Value Date/Time   LIPASE 21 07/25/2013 0844       Studies/Results: DG Abd 1  View  Result Date: 03/13/2020 CLINICAL DATA:  Ileus. EXAM: ABDOMEN - 1 VIEW COMPARISON:  03/11/2020.  CT 03/04/2020. FINDINGS: Bilateral ureteral stents again noted in stable position. Persistent small-bowel distention without interim change. Colon is nondistended. No free air. Degenerative change lumbar spine. Surgical clips noted the pelvis. Bilateral hip replacements. IMPRESSION: 1.  Bilateral ureteral stents again noted in stable position. 2. Persistent small-bowel distention consistent with small bowel obstruction. No interim change. Colon is nondistended. No free air. 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  Ad to FLD and then as tolerates to carb mod diet -cont to mobilize as able and maintain electrolytes -no evidence of obstruction -we will sign off.  Please call us back if needed.  D/W primary service.  FEN - FLD, ADAT VTE - heparin gtt ID - cefepime    LOS: 9 days    [name] , [facility] 03/13/2020, 8:42 AM Please see Amion for pager number during day hours 7:00am-4:30pm or 7:00am -11:30am on weekends

## 2020-03-14 ENCOUNTER — Encounter (HOSPITAL_COMMUNITY): Payer: HMO

## 2020-03-14 LAB — BASIC METABOLIC PANEL
Anion gap: 9 (ref 5–15)
BUN: 24 mg/dL — ABNORMAL HIGH (ref 8–23)
CO2: 22 mmol/L (ref 22–32)
Calcium: 7.7 mg/dL — ABNORMAL LOW (ref 8.9–10.3)
Chloride: 109 mmol/L (ref 98–111)
Creatinine, Ser: 1.21 mg/dL (ref 0.61–1.24)
GFR, Estimated: >60 mL/min (ref >60)
Glucose, Bld: 138 mg/dL — ABNORMAL HIGH (ref 70–99)
Potassium: 4.8 mmol/L (ref 3.5–5.1)
Sodium: 140 mmol/L (ref 135–145)

## 2020-03-14 LAB — GLUCOSE, CAPILLARY
Glucose-Capillary: 123 mg/dL — ABNORMAL HIGH (ref 70–99)
Glucose-Capillary: 111 mg/dL — ABNORMAL HIGH (ref 70–99)
Glucose-Capillary: 164 mg/dL — ABNORMAL HIGH (ref 70–99)
Glucose-Capillary: 110 mg/dL — ABNORMAL HIGH (ref 70–99)

## 2020-03-14 LAB — CULTURE, BLOOD (ROUTINE X 2)
Culture: NO GROWTH
Culture: NO GROWTH
Special Requests: ADEQUATE
Special Requests: ADEQUATE

## 2020-03-14 LAB — AEROBIC/ANAEROBIC CULTURE W GRAM STAIN (SURGICAL/DEEP WOUND)

## 2020-03-14 LAB — MAGNESIUM: Magnesium: 1.4 mg/dL — ABNORMAL LOW (ref 1.7–2.4)

## 2020-03-14 MED ORDER — SODIUM CHLORIDE 0.9 % IV SOLN
2.0000 g | Freq: Three times a day (TID) | INTRAVENOUS | Status: DC
  Administered 2020-03-14 – 2020-03-20 (×19): 2 g via INTRAVENOUS
  Filled 2020-03-14 (×7): qty 2
  Filled 2020-03-14: qty 0.07
  Filled 2020-03-14 (×13): qty 2

## 2020-03-14 MED ORDER — DILTIAZEM HCL ER COATED BEADS 120 MG PO CP24
120.0000 mg | ORAL_CAPSULE | Freq: Every day | ORAL | Status: DC
  Administered 2020-03-14 – 2020-03-20 (×7): 120 mg via ORAL
  Filled 2020-03-14 (×7): qty 1

## 2020-03-14 MED ORDER — MAGNESIUM SULFATE 2 GM/50ML IV SOLN
2.0000 g | Freq: Once | INTRAVENOUS | Status: AC
  Administered 2020-03-14: 2 g via INTRAVENOUS
  Filled 2020-03-14: qty 50

## 2020-03-14 NOTE — Progress Notes (Signed)
PHARMACY CONSULT NOTE FOR:  OUTPATIENT  PARENTERAL ANTIBIOTIC THERAPY (OPAT)  Indication: Prosthetic joint infection of L hip Regimen: Cefepime 2g IV q8h End date: 04/20/2020  IV antibiotic discharge orders are pended. To discharging provider:  please sign these orders via discharge navigator,  Select New Orders & click on the button choice - Manage This Unsigned Work.     Thank you for allowing pharmacy to be a part of this patient's care.  Eudelia Bunch, Pharm.D 03/14/2020 11:08 AM

## 2020-03-14 NOTE — Progress Notes (Signed)
PROGRESS NOTE    [name]  TTS:[id number] DOB: Aug 19, 1945 DOA: 03/03/2020 PCP: Luetta Nutting, DO    Brief Narrative:  [name] is a 74 y.o. male with medical history significant of coronary artery disease, diabetes type 2, hypertension, anemia, CKD stage IIIb, osteoarthritis status post left hip arthroplasty, renal stone/hydronephrosis status post stents, prostate cancer ,paroxysmal atrial flutter not on anticoagulation who was sent for direct [facility],Dr Aluisio.  Patient was seen in the emergency department on 03/01/2020 for left hip pain.  There was no report of injury.  He had CT left hip done which showed  fluid collection.  He was found to have elevated ESR and CRP.  He was discharged from the emergency department to follow-up with orthopedics as per orthopedics recommendation.    Assessment & Plan: Enterobacter Cloacae Bacteremia  Emphysematous Pyelitis  Left Hip Septic Arthritis/Prosthetic Joint Infection CT left hip large fluid and air containing collection surroudning the proximal femoral component measuring up to 8.7 cm, concerning for infection/abscess.   Also concern for osteomyelitis and septic arthritis. S/p CT guided placed 10 F drainage catheter placement  Culture with abundant enterobacter cloacae (resistant to cefazolin) Blood culture with 1 of 2 sets positive for enterobacter cloacae (resistant to cefazolin) Urine cx with enterobacter species (resistant to cefazolin) Repeat blood cx NG Ortho s/p I&D L hip with liner exchange on 11/29  cx with few gram negative rods and rare gram positive cocci  ID consult, appreciate  recommending transition to cefepime.   S/p tunneled line on 11/30 by IR.   Will need 6 weeks IV abx, followed by oral abx   Partial SBO vs Ileus:  s/p NG tube placement for diffusely gas distended bowel 11/26 with mildly dilated small bowel loops Surgery c/s, appreciate recs -  suspects this was ileus 2/2 medical issues that appears to be resolving We will advance diet.  Monitor.  Paroxysmal A. fib/atrial flutter:  Follows with Dr [name].   Not on any anticoagulation at home because of anemia. chadvasc at least 3. Rate controlled  Appreciate cardiology assistance Currently on amiodarone, Lovenox, Lopressor and Cardizem. echo - EF 55-60%, no RWMA (see report)  Diabetes type 2 uncontrolled with hyperglycemia Hemoglobin A1c of 7.1 as per 9/21. SSI - hold home amaryl and januvia Adjust prn  AKI on CKD stage IIIb Anion Gap Metabolic Acidosis:  His kidney function has fluctuated over several months from 1.5-3, unclear baseline.   Continue to monitor kidney function.    History of severe left hydroureteronephrosis/prostate cancer: Follows with urology.   He is status post bilateral ureteral stent placement by Dr. Tresa Moore 10/6.   Currently prostate cancer is in remission. Patient will undergo stent exchange next Tuesday as that can be a nidus for infection as well.  Normocytic anemia: Most likely associated  with chronic kidney disease, chronic medical problems.  Hemoglobin currently stable. No report of hematochezia or melena.  Coronary artery disease: Takes aspirin.  continue to hold in preparation for procedure. Denies any chest pain.    Hypertension:  continue dilt and metop as noted above  Hyperlipidemia: Takes Lipitor.  GERD: Continue Protonix  Left hip pain/debility:  PT recommends home with SNF  DVT prophylaxis: heparin Code Status: full  Family Communication: shelby at bedside Disposition:   Status is: inpatient  The patient will require care spanning > 2 midnights and should be moved to inpatient because: Inpatient level of care appropriate due to severity  of illness  Dispo: The patient is from: Home              Anticipated d/c is to: pending              Anticipated d/c date is: > 3 days              Patient currently is not  medically stable to d/c.   Consultants:   IR  Ortho  Procedures: IR IMPRESSION: Successful CT guided placement of a 12 French all purpose drain catheter into the complex fluid collection involving the proximal lateral aspect the left thigh with aspiration of 50 cc of purulent fluid. Samples were sent to the laboratory as requested by the ordering clinical team.  Echo IMPRESSIONS    1. Left ventricular ejection fraction, by estimation, is 55 to 60%. The  left ventricle has normal function. The left ventricle has no regional  wall motion abnormalities. There is mild concentric left ventricular  hypertrophy. Left ventricular diastolic  function could not be evaluated.  2. Right ventricular systolic function is normal. The right ventricular  size is normal.  3. Left atrial size was mildly dilated.  4. The pericardial effusion is circumferential.  5. The mitral valve is normal in structure. Mild mitral valve  regurgitation. No evidence of mitral stenosis.  6. The aortic valve is normal in structure. Aortic valve regurgitation is  not visualized. Mild aortic valve sclerosis is present, with no evidence  of aortic valve stenosis.  7. The inferior vena cava is normal in size with greater than 50%  respiratory variability, suggesting right atrial pressure of 3 mmHg.   Comparison(s): No significant change from prior study. Prior images  reviewed side by side.  Antimicrobials: Anti-infectives (From admission, onward)   Start     Dose/Rate Route Frequency Ordered Stop   03/14/20 1400  ceFEPIme (MAXIPIME) 2 g in sodium chloride 0.9 % 100 mL IVPB        2 g 200 mL/hr over 30 Minutes Intravenous Every 8 hours 03/14/20 1111     03/06/20 1700  ceFEPIme (MAXIPIME) 2 g in sodium chloride 0.9 % 100 mL IVPB  Status:  Discontinued        2 g 200 mL/hr over 30 Minutes Intravenous Every 12 hours 03/06/20 1340 03/14/20 1111   03/05/20 1000  piperacillin-tazobactam (ZOSYN) IVPB 3.375 g   Status:  Discontinued        3.375 g 12.5 mL/hr over 240 Minutes Intravenous Every 8 hours 03/05/20 0904 03/06/20 1311     Subjective: No nausea no vomiting.  No fever no chills.  Still has bowel movement.  Abdomen still distended.  Tolerating diet.  Objective: Vitals:   03/13/20 1321 03/13/20 2012 03/14/20 0600 03/14/20 1100  BP: 135/85 135/85 (!) 147/72 (!) 133/17  Pulse: 100 87 79 78  Resp: '13  20 14  ' Temp: 97.7 F (36.5 C) 98.6 F (37 C) 98.1 F (36.7 C) 97.7 F (36.5 C)  TempSrc: Oral Oral Oral Oral  SpO2:   100% 99%  Weight:      Height:        Intake/Output Summary (Last 24 hours) at 03/14/2020 1929 Last data filed at 03/14/2020 1500 Gross per 24 hour  Intake 20 ml  Output 650 ml  Net -630 ml   Filed Weights   03/11/20 0528 03/12/20 0447 03/13/20 0500  Weight: 104.3 kg 103.8 kg 103.6 kg    Examination:  General: Appear in mild distress, no Rash;  Oral Mucosa Clear, moist. no Abnormal Neck Mass Or lumps, Conjunctiva normal  Cardiovascular: S1 and S2 Present, no Murmur, Respiratory: good respiratory effort, Bilateral Air entry present and CTA, no Crackles, no wheezes Abdomen: Bowel Sound present, Soft and distended, no tenderness Extremities: Left leg edema, no tenderness Neurology: alert and oriented to time, place, and person affect appropriate. no new focal deficit Gait not checked due to patient safety concerns    Data Reviewed: I have personally reviewed following labs and imaging studies  CBC: Recent Labs  Lab 03/08/20 0610 03/08/20 0610 03/09/20 0419 03/10/20 0633 03/11/20 0143 03/12/20 0450 03/13/20 0408  WBC 7.3   < > 7.6 13.3* 11.9* 11.9* 10.5  NEUTROABS 5.8  --  6.0  --   --   --   --   HGB 8.8*   < > 9.0* 10.7* 8.9* 8.9* 8.8*  HCT 28.4*   < > 28.6* 32.8* 28.3* 28.6* 28.1*  MCV 96.3   < > 94.7 92.1 94.6 95.3 96.2  PLT 467*   < > 500* 449* 399 418* 388   < > = values in this interval not displayed.    Basic Metabolic Panel: Recent  Labs  Lab 03/08/20 0610 03/08/20 0610 03/09/20 0419 03/10/20 0633 03/11/20 0143 03/12/20 0450 03/14/20 0420  NA 140   < > 138 138 136 139 140  K 4.1   < > 4.4 5.1 5.2* 4.9 4.8  CL 101   < > 101 104 105 107 109  CO2 26   < > '25 22 22 25 22  ' GLUCOSE 158*   < > 148* 194* 223* 163* 138*  BUN 87*   < > 57* 51* 58* 47* 24*  CREATININE 1.82*   < > 1.44* 1.69* 1.84* 1.49* 1.21  CALCIUM 8.2*   < > 8.3* 8.1* 7.8* 8.0* 7.7*  MG 2.8*  --  2.3  --   --  2.0 1.4*  PHOS 3.4  --  2.7  --   --   --   --    < > = values in this interval not displayed.    GFR: Estimated Creatinine Clearance: 63.6 mL/min (by C-G formula based on SCr of 1.21 mg/dL).  Liver Function Tests: Recent Labs  Lab 03/08/20 0610 03/09/20 0419 03/10/20 0633 03/12/20 0450  AST 22 44* 76* 35  ALT 17 33 67* 49*  ALKPHOS 101 94 97 83  BILITOT 0.5 0.6 0.9 0.5  PROT 7.3 7.1 6.9 6.0*  ALBUMIN 2.4* 2.3* 2.4* 2.3*    CBG: Recent Labs  Lab 03/13/20 1625 03/13/20 2043 03/14/20 0728 03/14/20 1120 03/14/20 1637  GLUCAP 170* 147* 123* 111* 164*     Recent Results (from the past 240 hour(s))  Culture, Urine     Status: Abnormal   Collection Time: 03/05/20  8:10 AM   Specimen: Urine, Random  Result Value Ref Range Status   Specimen Description   Final    URINE, RANDOM Performed at [facility], [place] [address], [place], [place] [address]    Special Requests   Final    NONE Performed at [facility], [place] [address] [place], [place] [address]    Culture >=100,000 COLONIES/mL ENTEROBACTER SPECIES (A)  Final   Report Status 03/07/2020 FINAL  Final   Organism ID, Bacteria ENTEROBACTER SPECIES (A)  Final      Susceptibility   Enterobacter species - MIC*    CEFAZOLIN >=64 RESISTANT Resistant     CEFEPIME <=0.12  SENSITIVE Sensitive     CEFTRIAXONE <=0.25 SENSITIVE Sensitive     CIPROFLOXACIN <=0.25 SENSITIVE Sensitive     GENTAMICIN <=1 SENSITIVE Sensitive     IMIPENEM 0.5  SENSITIVE Sensitive     NITROFURANTOIN 32 SENSITIVE Sensitive     TRIMETH/SULFA <=20 SENSITIVE Sensitive     PIP/TAZO <=4 SENSITIVE Sensitive     * >=100,000 COLONIES/mL ENTEROBACTER SPECIES  Culture, blood (Routine X 2) w Reflex to ID Panel     Status: None   Collection Time: 03/09/20 12:50 PM   Specimen: BLOOD  Result Value Ref Range Status   Specimen Description   Final    BLOOD RIGHT WRIST Performed at [facility] [address], [place], [place] [address]    Special Requests   Final    BOTTLES DRAWN AEROBIC ONLY Blood Culture adequate volume Performed at [facility] [address] [place], [place] [address]    Culture   Final    NO GROWTH 5 DAYS Performed at [facility], [place] [address], [place], [place] [address]    Report Status 03/14/2020 FINAL  Final  Culture, blood (Routine X 2) w Reflex to ID Panel     Status: None   Collection Time: 03/09/20 12:50 PM   Specimen: BLOOD RIGHT HAND  Result Value Ref Range Status   Specimen Description   Final    BLOOD RIGHT HAND Performed at [facility] [address], [place], [place] [address]    Special Requests   Final    BOTTLES DRAWN AEROBIC AND ANAEROBIC Blood Culture adequate volume Performed at [place] [facility] [address], [place], [place] [address]    Culture   Final    NO GROWTH 5 DAYS Performed at [facility], [place] [address] [place], [place] [address]    Report Status 03/14/2020 FINAL  Final  Aerobic/Anaerobic Culture (surgical/deep wound)     Status: None   Collection Time: 03/09/20  3:53 PM   Specimen: PATH Other; Tissue  Result Value Ref Range Status   Specimen Description   Final    ABSCESS RIGHT HIP Performed at [facility] [address] [place], [place] [address]    Special Requests   Final    NONE Performed at [facility], [place] [address], [place], [place] [address]     Gram Stain   Final    FEW WBC PRESENT,BOTH PMN AND MONONUCLEAR FEW GRAM NEGATIVE RODS RARE GRAM POSITIVE COCCI    Culture   Final    FEW ENTEROBACTER CLOACAE NO ANAEROBES ISOLATED Performed at [facility], [place] [address], [place], [place] [address]    Report Status 03/14/2020 FINAL  Final   Organism ID, Bacteria ENTEROBACTER CLOACAE  Final      Susceptibility   Enterobacter cloacae - MIC*    CEFAZOLIN >=64 RESISTANT Resistant     CEFEPIME <=0.12 SENSITIVE Sensitive     CEFTAZIDIME <=1 SENSITIVE Sensitive     CIPROFLOXACIN <=0.25 SENSITIVE Sensitive     GENTAMICIN <=1 SENSITIVE Sensitive     IMIPENEM 0.5 SENSITIVE Sensitive     TRIMETH/SULFA <=20 SENSITIVE Sensitive     PIP/TAZO <=4 SENSITIVE Sensitive     * FEW ENTEROBACTER CLOACAE         Radiology Studies: DG Abd 1 View  Result Date: 03/13/2020 CLINICAL DATA:  Ileus. EXAM: ABDOMEN - 1 VIEW COMPARISON:  03/11/2020.  CT 03/04/2020. FINDINGS: Bilateral  ureteral stents again noted in stable position. Persistent small-bowel distention without interim change. Colon is nondistended. No free air. Degenerative change lumbar spine. Surgical clips noted the pelvis. Bilateral hip replacements. IMPRESSION: 1.  Bilateral ureteral stents again noted in stable position. 2. Persistent small-bowel distention consistent with small bowel obstruction. No interim change. Colon is nondistended. No free air. Electronically Signed   By: Marcello Moores  Register   On: 03/13/2020 06:51        Scheduled Meds: . amiodarone  200 mg Oral Daily  . atorvastatin  40 mg Oral Daily  . Chlorhexidine Gluconate Cloth  6 each Topical Daily  . diltiazem  120 mg Oral Daily  . enoxaparin (LOVENOX) injection  100 mg Subcutaneous BID  . ferrous sulfate  325 mg Oral Q breakfast  . gabapentin  300 mg Oral TID  . insulin aspart  0-15 Units Subcutaneous TID WC  . insulin aspart  0-5 Units Subcutaneous QHS  . lip balm  1 application Topical BID  . metoprolol  tartrate  25 mg Oral BID  . pantoprazole  40 mg Oral Daily  . simethicone  80 mg Oral QID  . sodium chloride flush  10-40 mL Intracatheter Q12H  . sodium chloride flush  5 mL Intracatheter Q8H   Continuous Infusions: . sodium chloride 75 mL/hr at 03/13/20 2142  . ceFEPime (MAXIPIME) IV 2 g (03/14/20 1542)     LOS: 10 days    Time spent: over 21 min    Berle Mull, MD [facility]   To contact the attending provider between 7A-7P or the covering provider during after hours 7P-7A, please log into the web site [email or web address] and access using universal [place] password for that web site. If you do not have the password, please call the hospital operator.  03/14/2020, 7:29 PM

## 2020-03-14 NOTE — Plan of Care (Signed)
  Problem: Clinical Measurements: Goal: Will remain free from infection Outcome: Progressing Goal: Diagnostic test results will improve Outcome: Progressing   Problem: Activity: Goal: Risk for activity intolerance will decrease Outcome: Progressing   Problem: Nutrition: Goal: Adequate nutrition will be maintained Outcome: Progressing   Problem: Pain Managment: Goal: General experience of comfort will improve Outcome: Progressing   Problem: Safety: Goal: Ability to remain free from injury will improve Outcome: Progressing   Problem: Skin Integrity: Goal: Risk for impaired skin integrity will decrease Outcome: Progressing   

## 2020-03-15 ENCOUNTER — Encounter (HOSPITAL_COMMUNITY): Payer: HMO

## 2020-03-15 LAB — GLUCOSE, CAPILLARY
Glucose-Capillary: 148 mg/dL — ABNORMAL HIGH (ref 70–99)
Glucose-Capillary: 114 mg/dL — ABNORMAL HIGH (ref 70–99)
Glucose-Capillary: 188 mg/dL — ABNORMAL HIGH (ref 70–99)
Glucose-Capillary: 127 mg/dL — ABNORMAL HIGH (ref 70–99)

## 2020-03-15 LAB — BASIC METABOLIC PANEL
Anion gap: 8 (ref 5–15)
BUN: 21 mg/dL (ref 8–23)
CO2: 20 mmol/L — ABNORMAL LOW (ref 22–32)
Calcium: 7.7 mg/dL — ABNORMAL LOW (ref 8.9–10.3)
Chloride: 110 mmol/L (ref 98–111)
Creatinine, Ser: 1.33 mg/dL — ABNORMAL HIGH (ref 0.61–1.24)
GFR, Estimated: 56 mL/min — ABNORMAL LOW (ref >60)
Glucose, Bld: 161 mg/dL — ABNORMAL HIGH (ref 70–99)
Potassium: 4.7 mmol/L (ref 3.5–5.1)
Sodium: 138 mmol/L (ref 135–145)

## 2020-03-15 LAB — MAGNESIUM: Magnesium: 1.6 mg/dL — ABNORMAL LOW (ref 1.7–2.4)

## 2020-03-15 MED ORDER — FERROUS SULFATE 325 (65 FE) MG PO TABS
325.0000 mg | ORAL_TABLET | Freq: Three times a day (TID) | ORAL | Status: DC
  Administered 2020-03-15 – 2020-03-20 (×12): 325 mg via ORAL
  Filled 2020-03-15 (×12): qty 1

## 2020-03-15 MED ORDER — MAGNESIUM SULFATE 2 GM/50ML IV SOLN
2.0000 g | Freq: Once | INTRAVENOUS | Status: AC
  Administered 2020-03-15: 2 g via INTRAVENOUS
  Filled 2020-03-15: qty 50

## 2020-03-15 MED ORDER — PSYLLIUM 95 % PO PACK
1.0000 | PACK | ORAL | Status: DC
  Administered 2020-03-15 – 2020-03-19 (×2): 1 via ORAL
  Filled 2020-03-15 (×4): qty 1

## 2020-03-15 NOTE — Progress Notes (Signed)
PROGRESS NOTE    [name]  HYW:[id number] DOB: [birth year] DOA: 03/03/2020 PCP: Luetta Nutting, DO    Brief Narrative:  [name] is a 74 y.o. male with medical history significant of coronary artery disease, diabetes type 2, hypertension, anemia, CKD stage IIIb, osteoarthritis status post left hip arthroplasty, renal stone/hydronephrosis status post stents, prostate cancer ,paroxysmal atrial flutter not on anticoagulation who was sent for direct admission to [facility],Dr Aluisio.  Patient was seen in the emergency department on 03/01/2020 for left hip pain.  There was no report of injury.  He had CT left hip done which showed  fluid collection.  He was found to have elevated ESR and CRP.  He was discharged from the emergency department to follow-up with orthopedics as per orthopedics recommendation.    Assessment & Plan: Enterobacter Cloacae Bacteremia  Emphysematous Pyelitis  Left Hip Septic Arthritis/Prosthetic Joint Infection CT left hip large fluid and air containing collection surroudning the proximal femoral component measuring up to 8.7 cm, concerning for infection/abscess.   Also concern for osteomyelitis and septic arthritis. S/p CT guided placed 10 F drainage catheter placement  Culture with abundant enterobacter cloacae (resistant to cefazolin) Blood culture with 1 of 2 sets positive for enterobacter cloacae (resistant to cefazolin) Urine cx with enterobacter species (resistant to cefazolin) Repeat blood cx NG Ortho s/p I&D L hip with liner exchange on 11/29  cx with few gram negative rods and rare gram positive cocci  ID consult, appreciate  recommending transition to cefepime.   S/p tunneled line on 11/30 by IR.   Will need 6 weeks IV abx, followed by oral abx   Partial SBO vs Ileus:  s/p NG tube placement for diffusely gas distended bowel 11/26 with mildly dilated small bowel loops Surgery c/s, appreciate recs -  suspects this was ileus 2/2 medical issues that appears to be resolving We will advance diet.  Monitor.  Paroxysmal A. fib/atrial flutter:  Follows with Dr [name].   Not on any anticoagulation at home because of anemia. chadvasc at least 3. Rate controlled  Appreciate cardiology assistance Currently on amiodarone, Lovenox, Lopressor and Cardizem. echo - EF 55-60%, no RWMA (see report)  Diabetes type 2 uncontrolled with hyperglycemia Hemoglobin A1c of 7.1 as per 9/21. SSI - hold home amaryl and januvia Adjust prn  AKI on CKD stage IIIb Anion Gap Metabolic Acidosis:  His kidney function has fluctuated over several months from 1.5-3, unclear baseline.   Continue to monitor kidney function.    History of severe left hydroureteronephrosis/prostate cancer: Follows with urology.   He is status post bilateral ureteral stent placement by Dr. Tresa Moore 10/6.   Currently prostate cancer is in remission. Patient will undergo stent exchange next Tuesday as that can be a nidus for infection as well.  Normocytic anemia: Most likely associated  with chronic kidney disease, chronic medical problems.  Hemoglobin currently stable. No report of hematochezia or melena.  Coronary artery disease: Takes aspirin.  continue to hold in preparation for procedure. Denies any chest pain.    Hypertension:  continue dilt and metop as noted above  Hyperlipidemia: Takes Lipitor.  GERD: Continue Protonix  Left hip pain/debility:  PT recommends home with SNF  Left leg edema. Doppler currently pending.  Currently on anticoagulation.  DVT prophylaxis: heparin Code Status: full  Family Communication: shelby at bedside Disposition:   Status is: inpatient  The patient will require care spanning > 2 midnights and should be moved  to inpatient because: Inpatient level of care appropriate due to severity of illness  Dispo: The patient is from: Home              Anticipated d/c is to: pending               Anticipated d/c date is: > 3 days              Patient currently is not medically stable to d/c.   Consultants:   IR  Ortho  Procedures: IR IMPRESSION: Successful CT guided placement of a 12 French all purpose drain catheter into the complex fluid collection involving the proximal lateral aspect the left thigh with aspiration of 50 cc of purulent fluid. Samples were sent to the laboratory as requested by the ordering clinical team.  Echo IMPRESSIONS    1. Left ventricular ejection fraction, by estimation, is 55 to 60%. The  left ventricle has normal function. The left ventricle has no regional  wall motion abnormalities. There is mild concentric left ventricular  hypertrophy. Left ventricular diastolic  function could not be evaluated.  2. Right ventricular systolic function is normal. The right ventricular  size is normal.  3. Left atrial size was mildly dilated.  4. The pericardial effusion is circumferential.  5. The mitral valve is normal in structure. Mild mitral valve  regurgitation. No evidence of mitral stenosis.  6. The aortic valve is normal in structure. Aortic valve regurgitation is  not visualized. Mild aortic valve sclerosis is present, with no evidence  of aortic valve stenosis.  7. The inferior vena cava is normal in size with greater than 50%  respiratory variability, suggesting right atrial pressure of 3 mmHg.   Comparison(s): No significant change from prior study. Prior images  reviewed side by side.  Antimicrobials: Anti-infectives (From admission, onward)   Start     Dose/Rate Route Frequency Ordered Stop   03/14/20 1400  ceFEPIme (MAXIPIME) 2 g in sodium chloride 0.9 % 100 mL IVPB        2 g 200 mL/hr over 30 Minutes Intravenous Every 8 hours 03/14/20 1111     03/06/20 1700  ceFEPIme (MAXIPIME) 2 g in sodium chloride 0.9 % 100 mL IVPB  Status:  Discontinued        2 g 200 mL/hr over 30 Minutes Intravenous Every 12 hours 03/06/20 1340  03/14/20 1111   03/05/20 1000  piperacillin-tazobactam (ZOSYN) IVPB 3.375 g  Status:  Discontinued        3.375 g 12.5 mL/hr over 240 Minutes Intravenous Every 8 hours 03/05/20 0904 03/06/20 1311     Subjective: Overnight had significant gas.  Abdominal pain as well.  Currently pain resolved.  Still able to tolerate diet with no nausea no vomiting.  Bowel movement still happening.  Objective: Vitals:   03/15/20 0416 03/15/20 0500 03/15/20 0842 03/15/20 1633  BP: (!) 131/52  (!) 148/71 (!) 140/59  Pulse:   87 79  Resp:    18  Temp: 98.7 F (37.1 C)   98.3 F (36.8 C)  TempSrc: Oral     SpO2:      Weight:  107.1 kg    Height:        Intake/Output Summary (Last 24 hours) at 03/15/2020 1927 Last data filed at 03/15/2020 1700 Gross per 24 hour  Intake 554.9 ml  Output -  Net 554.9 ml   Filed Weights   03/12/20 0447 03/13/20 0500 03/15/20 0500  Weight: 103.8 kg 103.6 kg  107.1 kg    Examination:  General: Appear in mild distress, no Rash; Oral Mucosa Clear, moist. no Abnormal Neck Mass Or lumps, Conjunctiva normal  Cardiovascular: S1 and S2 Present, no Murmur, Respiratory: good respiratory effort, Bilateral Air entry present and CTA, no Crackles, no wheezes Abdomen: Bowel Sound present, Soft and distended, no tenderness Extremities: Left leg edema, no tenderness Neurology: alert and oriented to time, place, and person affect appropriate. no new focal deficit Gait not checked due to patient safety concerns    Data Reviewed: I have personally reviewed following labs and imaging studies  CBC: Recent Labs  Lab 03/09/20 0419 03/10/20 0633 03/11/20 0143 03/12/20 0450 03/13/20 0408  WBC 7.6 13.3* 11.9* 11.9* 10.5  NEUTROABS 6.0  --   --   --   --   HGB 9.0* 10.7* 8.9* 8.9* 8.8*  HCT 28.6* 32.8* 28.3* 28.6* 28.1*  MCV 94.7 92.1 94.6 95.3 96.2  PLT 500* 449* 399 418* 378    Basic Metabolic Panel: Recent Labs  Lab 03/09/20 0419 03/09/20 0419 03/10/20 0633 03/11/20  0143 03/12/20 0450 03/14/20 0420 03/15/20 0415  NA 138   < > 138 136 139 140 138  K 4.4   < > 5.1 5.2* 4.9 4.8 4.7  CL 101   < > 104 105 107 109 110  CO2 25   < > '22 22 25 22 ' 20*  GLUCOSE 148*   < > 194* 223* 163* 138* 161*  BUN 57*   < > 51* 58* 47* 24* 21  CREATININE 1.44*   < > 1.69* 1.84* 1.49* 1.21 1.33*  CALCIUM 8.3*   < > 8.1* 7.8* 8.0* 7.7* 7.7*  MG 2.3  --   --   --  2.0 1.4* 1.6*  PHOS 2.7  --   --   --   --   --   --    < > = values in this interval not displayed.    GFR: Estimated Creatinine Clearance: 58.8 mL/min (A) (by C-G formula based on SCr of 1.33 mg/dL (H)).  Liver Function Tests: Recent Labs  Lab 03/09/20 0419 03/10/20 0633 03/12/20 0450  AST 44* 76* 35  ALT 33 67* 49*  ALKPHOS 94 97 83  BILITOT 0.6 0.9 0.5  PROT 7.1 6.9 6.0*  ALBUMIN 2.3* 2.4* 2.3*    CBG: Recent Labs  Lab 03/14/20 1637 03/14/20 2116 03/15/20 0812 03/15/20 1204 03/15/20 1721  GLUCAP 164* 110* 148* 114* 188*     Recent Results (from the past 240 hour(s))  Culture, blood (Routine X 2) w Reflex to ID Panel     Status: None   Collection Time: 03/09/20 12:50 PM   Specimen: BLOOD  Result Value Ref Range Status   Specimen Description   Final    BLOOD RIGHT WRIST Performed at [facility] [address], [place], [place] [address]    Special Requests   Final    BOTTLES DRAWN AEROBIC ONLY Blood Culture adequate volume Performed at [facility] [address], [place], [place] [address]    Culture   Final    NO GROWTH 5 DAYS Performed at [facility], [place] [address] [place], [place] [address]    Report Status 03/14/2020 FINAL  Final  Culture, blood (Routine X 2) w Reflex to ID Panel     Status: None   Collection Time: 03/09/20 12:50 PM   Specimen: BLOOD RIGHT HAND  Result Value Ref Range Status  Specimen Description   Final    BLOOD RIGHT HAND Performed at [facility] [address],  [place], [place] [address]    Special Requests   Final    BOTTLES DRAWN AEROBIC AND ANAEROBIC Blood Culture adequate volume Performed at [facility] [address], [place], [place] [address]    Culture   Final    NO GROWTH 5 DAYS Performed at [facility], [place] [address], [place], [place] [address]    Report Status 03/14/2020 FINAL  Final  Aerobic/Anaerobic Culture (surgical/deep wound)     Status: None   Collection Time: 03/09/20  3:53 PM   Specimen: PATH Other; Tissue  Result Value Ref Range Status   Specimen Description   Final    ABSCESS RIGHT HIP Performed at [facility] [address] [place], [place] [address]    Special Requests   Final    NONE Performed at [facility], [place] [address], [place], [place] [address]    Gram Stain   Final    FEW WBC PRESENT,BOTH PMN AND MONONUCLEAR FEW GRAM NEGATIVE RODS RARE GRAM POSITIVE COCCI    Culture   Final    FEW ENTEROBACTER CLOACAE NO ANAEROBES ISOLATED Performed at [facility], [place] [address] [place],  [address]    Report Status 03/14/2020 FINAL  Final   Organism ID, Bacteria ENTEROBACTER CLOACAE  Final      Susceptibility   Enterobacter cloacae - MIC*    CEFAZOLIN >=64 RESISTANT Resistant     CEFEPIME <=0.12 SENSITIVE Sensitive     CEFTAZIDIME <=1 SENSITIVE Sensitive     CIPROFLOXACIN <=0.25 SENSITIVE Sensitive     GENTAMICIN <=1 SENSITIVE Sensitive     IMIPENEM 0.5 SENSITIVE Sensitive     TRIMETH/SULFA <=20 SENSITIVE Sensitive     PIP/TAZO <=4 SENSITIVE Sensitive     * FEW ENTEROBACTER CLOACAE         Radiology Studies: No results found.      Scheduled Meds: . amiodarone  200 mg Oral Daily  . atorvastatin  40 mg Oral Daily  . Chlorhexidine Gluconate Cloth  6 each Topical Daily  . diltiazem  120 mg Oral Daily  . enoxaparin (LOVENOX) injection  100 mg Subcutaneous BID  . ferrous sulfate  325 mg Oral TID WC  . gabapentin  300  mg Oral TID  . insulin aspart  0-15 Units Subcutaneous TID WC  . insulin aspart  0-5 Units Subcutaneous QHS  . lip balm  1 application Topical BID  . metoprolol tartrate  25 mg Oral BID  . pantoprazole  40 mg Oral Daily  . psyllium  1 packet Oral QODAY  . simethicone  80 mg Oral QID  . sodium chloride flush  10-40 mL Intracatheter Q12H  . sodium chloride flush  5 mL Intracatheter Q8H   Continuous Infusions: . ceFEPime (MAXIPIME) IV 2 g (03/15/20 1342)     LOS: 11 days    Time spent: over 37 min    Berle Mull, MD [facility]   To contact the attending provider between 7A-7P or the covering provider during after hours 7P-7A, please log into the web site [email or web address] and access using universal [place] password for that web site. If you do not have the password, please call the hospital operator.  03/15/2020, 7:27 PM

## 2020-03-15 NOTE — Progress Notes (Signed)
ANTICOAGULATION CONSULT NOTE - Follow Up Consult  Pharmacy Consult for Enoxaparin Indication: atrial fibrillation  Allergies  Allergen Reactions  . Shellfish Allergy Rash    Patient Measurements: Height: 5\' 9"  (175.3 cm) Weight: 107.1 kg (236 lb 1.8 oz) IBW/kg (Calculated) : 70.7  Vital Signs: Temp: 98.7 F (37.1 C) (12/05 0416) Temp Source: Oral (12/05 0416) BP: 148/71 (12/05 0842) Pulse Rate: 87 (12/05 0842)  Labs: Recent Labs    03/13/20 0408 03/14/20 0420 03/15/20 0415  HGB 8.8*  --   --   HCT 28.1*  --   --   PLT 388  --   --   CREATININE  --  1.21 1.33*    Estimated Creatinine Clearance: 58.8 mL/min (A) (by C-G formula based on SCr of 1.33 mg/dL (H)).   Assessment: Pharmacy consulted 12/2 to switch heparin infusion to treatment dose enoxaparin for atrial fibrillation since ureteral stent replacement not scheduled until 03/17/20.  Patient's estimated creatinine clearance is greater than 30 ml/min. 03/15/2020  SCr 1.33, CrCl ~ 59 ml/min Last CBC 12/3 Hg 8.8, PLT WNL Cystoscopy w/stent replacement scheduled for 12/7 at 1515  Goal of Therapy:  Anti-Xa level 0.6-1 units/ml 4hrs after LMWH dose given if clinically indicated Monitor platelets by anticoagulation protocol: Yes   Plan:  Continue Enoxaparin 1 mg/kg subq every 12 hours through night prior to procedure 12/6- placed stop order for 12/6 at [facility] Hold enoxaparin day of procedure and resume when authorized by urologist Monitor CBC, s/s bleeding  Eudelia Bunch, Pharm.D 03/15/2020 11:04 AM

## 2020-03-15 NOTE — Plan of Care (Signed)
  Problem: Clinical Measurements: Goal: Will remain free from infection Outcome: Progressing Goal: Diagnostic test results will improve Outcome: Progressing   Problem: Activity: Goal: Risk for activity intolerance will decrease Outcome: Progressing   Problem: Nutrition: Goal: Adequate nutrition will be maintained Outcome: Progressing   Problem: Pain Managment: Goal: General experience of comfort will improve Outcome: Progressing   Problem: Safety: Goal: Ability to remain free from injury will improve Outcome: Progressing   Problem: Skin Integrity: Goal: Risk for impaired skin integrity will decrease Outcome: Progressing   

## 2020-03-16 ENCOUNTER — Encounter (HOSPITAL_COMMUNITY): Payer: Self-pay | Admitting: Family Medicine

## 2020-03-16 ENCOUNTER — Inpatient Hospital Stay (HOSPITAL_COMMUNITY): Payer: HMO

## 2020-03-16 DIAGNOSIS — R609 Edema, unspecified: Secondary | ICD-10-CM

## 2020-03-16 DIAGNOSIS — M7989 Other specified soft tissue disorders: Secondary | ICD-10-CM

## 2020-03-16 DIAGNOSIS — T8452XA Infection and inflammatory reaction due to internal left hip prosthesis, initial encounter: Principal | ICD-10-CM

## 2020-03-16 LAB — CBC WITH DIFFERENTIAL/PLATELET
Abs Immature Granulocytes: 0.04 10*3/uL (ref 0.00–0.07)
Basophils Absolute: 0 10*3/uL (ref 0.0–0.1)
Basophils Relative: 0 %
Eosinophils Absolute: 0.1 10*3/uL (ref 0.0–0.5)
Eosinophils Relative: 1 %
HCT: 25.7 % — ABNORMAL LOW (ref 39.0–52.0)
Hemoglobin: 8.2 g/dL — ABNORMAL LOW (ref 13.0–17.0)
Immature Granulocytes: 1 %
Lymphocytes Relative: 7 %
Lymphs Abs: 0.6 10*3/uL — ABNORMAL LOW (ref 0.7–4.0)
MCH: 29.8 pg (ref 26.0–34.0)
MCHC: 31.9 g/dL (ref 30.0–36.0)
MCV: 93.5 fL (ref 80.0–100.0)
Monocytes Absolute: 0.8 10*3/uL (ref 0.1–1.0)
Monocytes Relative: 10 %
Neutro Abs: 6.4 10*3/uL (ref 1.7–7.7)
Neutrophils Relative %: 81 %
Platelets: 303 10*3/uL (ref 150–400)
RBC: 2.75 MIL/uL — ABNORMAL LOW (ref 4.22–5.81)
RDW: 16 % — ABNORMAL HIGH (ref 11.5–15.5)
WBC: 7.9 10*3/uL (ref 4.0–10.5)
nRBC: 0 % (ref 0.0–0.2)

## 2020-03-16 LAB — CBC
HCT: 22.1 % — ABNORMAL LOW (ref 39.0–52.0)
HCT: 21.9 % — ABNORMAL LOW (ref 39.0–52.0)
Hemoglobin: 6.8 g/dL — CL (ref 13.0–17.0)
Hemoglobin: 6.8 g/dL — CL (ref 13.0–17.0)
MCH: 30 pg (ref 26.0–34.0)
MCH: 29.8 pg (ref 26.0–34.0)
MCHC: 30.8 g/dL (ref 30.0–36.0)
MCHC: 31.1 g/dL (ref 30.0–36.0)
MCV: 97.4 fL (ref 80.0–100.0)
MCV: 96.1 fL (ref 80.0–100.0)
Platelets: 313 10*3/uL (ref 150–400)
Platelets: 311 10*3/uL (ref 150–400)
RBC: 2.27 MIL/uL — ABNORMAL LOW (ref 4.22–5.81)
RBC: 2.28 MIL/uL — ABNORMAL LOW (ref 4.22–5.81)
RDW: 15 % (ref 11.5–15.5)
RDW: 15 % (ref 11.5–15.5)
WBC: 9.2 10*3/uL (ref 4.0–10.5)
WBC: 7.7 10*3/uL (ref 4.0–10.5)
nRBC: 0 % (ref 0.0–0.2)
nRBC: 0 % (ref 0.0–0.2)

## 2020-03-16 LAB — GLUCOSE, CAPILLARY
Glucose-Capillary: 117 mg/dL — ABNORMAL HIGH (ref 70–99)
Glucose-Capillary: 115 mg/dL — ABNORMAL HIGH (ref 70–99)
Glucose-Capillary: 171 mg/dL — ABNORMAL HIGH (ref 70–99)
Glucose-Capillary: 123 mg/dL — ABNORMAL HIGH (ref 70–99)

## 2020-03-16 LAB — BASIC METABOLIC PANEL
Anion gap: 8 (ref 5–15)
BUN: 23 mg/dL (ref 8–23)
CO2: 19 mmol/L — ABNORMAL LOW (ref 22–32)
Calcium: 7.4 mg/dL — ABNORMAL LOW (ref 8.9–10.3)
Chloride: 111 mmol/L (ref 98–111)
Creatinine, Ser: 1.41 mg/dL — ABNORMAL HIGH (ref 0.61–1.24)
GFR, Estimated: 52 mL/min — ABNORMAL LOW (ref >60)
Glucose, Bld: 139 mg/dL — ABNORMAL HIGH (ref 70–99)
Potassium: 4.8 mmol/L (ref 3.5–5.1)
Sodium: 138 mmol/L (ref 135–145)

## 2020-03-16 LAB — PREPARE RBC (CROSSMATCH)

## 2020-03-16 LAB — MAGNESIUM: Magnesium: 1.7 mg/dL (ref 1.7–2.4)

## 2020-03-16 MED ORDER — SODIUM CHLORIDE 0.9% IV SOLUTION: Freq: Once | INTRAVENOUS | Status: AC

## 2020-03-16 MED ORDER — CIPROFLOXACIN IN D5W 400 MG/200ML IV SOLN: 400.0000 mg | INTRAVENOUS | Status: AC

## 2020-03-16 MED ORDER — DEXTROMETHORPHAN POLISTIREX ER 30 MG/5ML PO SUER
30.0000 mg | Freq: Two times a day (BID) | ORAL | Status: DC
  Administered 2020-03-16 – 2020-03-20 (×8): 30 mg via ORAL
  Filled 2020-03-16 (×11): qty 5

## 2020-03-16 MED ORDER — CIPROFLOXACIN IN D5W 400 MG/200ML IV SOLN: 400.0000 mg | Freq: Two times a day (BID) | INTRAVENOUS | Status: DC

## 2020-03-16 MED ORDER — BENZONATATE 100 MG PO CAPS
100.0000 mg | ORAL_CAPSULE | Freq: Three times a day (TID) | ORAL | Status: DC | PRN
  Administered 2020-03-16 – 2020-03-19 (×4): 100 mg via ORAL
  Filled 2020-03-16 (×5): qty 1

## 2020-03-16 NOTE — Care Management Important Message (Signed)
Important Message  Patient Details IM Letter given to the Patient. Name: [name] MRN: [id number] Date of Birth: 27-Sep-1945   Medicare Important Message Given:  Yes     Kerin Salen 03/16/2020, 1:52 PM

## 2020-03-16 NOTE — Progress Notes (Signed)
[facility] Progress Note  Patient: [name]    OZD:[id number]  DOA: 03/03/2020     Date of Service: the patient was seen and examined on 03/16/2020  Brief hospital course: Past medical history of CAD, type II DM, HTN, CKD 3B, osteoarthritis, hydronephrosis SP stent placement, prostate cancer, paroxysmal a flutter presented as a direct admit from orthopedic office secondary to hip pain.  Found to have septic arthritis and Enterobacter cloacae bacteremia.  Treated aggressively with IV antibiotics. 10/6 insertion of bilateral ureteral stents 11/23 admission with left high hip pain 11/24 CT-guided drainage placement in left thigh abscess 11/29 incision and drainage of left hip with linear exchange for periprosthetic joint infection 11/30 placement of right IJ tunneled double-lumen CVC by IR. 12/6 CT scan shows persistent abscess with new large abscess on left thigh area. Currently plan is patient scheduled for bilateral stent exchange on 12/7, follow-up on IR and orthopedic recommendation regarding drainage of the abscess.  Assessment and Plan: 1.  Left hip septic arthritis. Periprosthetic joint infection. Emphysematous pyelitis. Enterobacter cloacae bacteremia. Presented with left hip pain. CT have left hip shows large fluid and air collection around proximal femur along with concern for osteomyelitis and septic arthritis. SP CT-guided drainage, cultures were positive for Enterobacter cloacae. Blood cultures positive for Enterobacter cloacae as well. Urine culture also positive. Repeat blood culture so far no growth. Orthopedic was consulted. SP irrigation and debridement of the left hip with surface exchange. Deep cultures continues to grow Enterobacter cloacae. Appreciate ID consultation throughout the hospital course. SP TDC placement by IR. Plan for IV cefepime through April 21, 2019 followed by oral antibiotics.. Due to persistent pain, weakness and swelling of the  left thigh CT scan was performed on 03/16/2020.  Concerning for new abscess in left thigh area. No evidence of hematoma. Discussed with orthopedic currently recommend conservative measures with IV antibiotics only. IR consulted after discussing with ID to see if drain placement is feasible..  2.  Ileus Possible partial SBO Patient with significant abdominal distention. Required NG tube placement currently removed. General surgery was initially consulted and later on reconsider due to reoccurrence of the symptoms. Currently able to tolerate p.o. diet. Continue to maintain K more than 4, mag more than 2. Continue Metamucil, simethicone.  3.  Paroxysmal atrial fibrillation as well as atrial flutter with RVR Patient follows up with Dr. [name]. Not on any anticoagulation prior to admission. Currently on therapeutic anticoagulation with Lovenox. Rate significantly fluctuating. Dose adjusted for amiodarone and Lopressor. Briefly on Cardizem drip now back on Cardizem p.o. along with rest of the medication. Echocardiogram shows to 60% EF.  4.  Prostate cancer history. Left-sided hydronephrosis. Ureter stent placement bilateral Prostate cancer currently in remission. Patient is scheduled for stent exchange on 03/17/2020  5.  Acute kidney injury on chronic renal disease stage IIIb. Anion gap metabolic acidosis. Hypokalemia Monitor renal function. Currently stable. Patient was given aggressive IV hydration.  6.  Anemia normocytic Possible blood loss anemia Hemoglobin significantly low compared to last 48 hours. Patient will be receiving 2 PRBC transfusion. We will recheck CBC. Currently Lovenox on hold. Continue to hold for procedure tomorrow as well.  7.  CAD On aspirin. Currently on hold.  8.  GERD Continue PPI.  9.  HLD Continue statin.  10.  Left leg edema. Doppler negative for DVT on 03/16/2020.  11.  Obesity Placing the patient at high risk for poor  outcome. Although currently minimal oral intake secondary to acute illness.  Monitor. Body mass index is 34.8 kg/m.   Diet: Low-sodium diet.  N.p.o. after midnight. DVT Prophylaxis:   SCD's Start: 03/16/20 0959 SCDs Start: 03/09/20 1851 Place TED hose Start: 03/09/20 1851 Place and maintain sequential compression device Start: 03/08/20 1851    Advance goals of care discussion: Full code  Family Communication: family was present at bedside, at the time of interview.  The pt provided permission to discuss medical plan with the family. Opportunity was given to ask question and all questions were answered satisfactorily.   Disposition:  Status is: Inpatient  Remains inpatient appropriate because:Ongoing diagnostic testing needed not appropriate for outpatient work up   Dispo: The patient is from: Home              Anticipated d/c is to: SNF              Anticipated d/c date is: > 3 days              Patient currently is not medically stable to d/c.  Subjective: No nausea no vomiting.  Continues to have left leg pain.  No fever no chills.  No blood in the stool.  Continues to have multiple BM.  Tolerating oral diet.  Physical Exam:  General: Appear in mild distress, no Rash; Oral Mucosa Clear, moist. no Abnormal Neck Mass Or lumps, Conjunctiva normal  Cardiovascular: S1 and S2 Present, no Murmur, Respiratory: good respiratory effort, Bilateral Air entry present and CTA, no Crackles, no wheezes Abdomen: Bowel Sound present, Soft and distended, no tenderness Extremities: Left leg pedal edema Neurology: alert and oriented to time, place, and person affect appropriate. no new focal deficit Gait not checked due to patient safety concerns  Vitals:   03/16/20 1402 03/16/20 1700 03/16/20 1743 03/16/20 1802  BP: (!) 122/55  (!) 127/59   Pulse: 78     Resp: 20     Temp: 98.2 F (36.8 C) 98.8 F (37.1 C) 98.8 F (37.1 C) 98 F (36.7 C)  TempSrc: Oral Oral Oral Oral  SpO2: 98%      Weight:      Height:        Intake/Output Summary (Last 24 hours) at 03/16/2020 1940 Last data filed at 03/16/2020 1700 Gross per 24 hour  Intake 1000 ml  Output 850 ml  Net 150 ml   Filed Weights   03/13/20 0500 03/15/20 0500 03/16/20 0500  Weight: 103.6 kg 107.1 kg 106.9 kg    Data Reviewed: I have personally reviewed and interpreted daily labs, tele strips, imagings as discussed above. I reviewed all nursing notes, pharmacy notes, vitals, pertinent old records I have discussed plan of care as described above with RN and patient/family.  CBC: Recent Labs  Lab 03/11/20 0143 03/12/20 0450 03/13/20 0408 03/16/20 0350 03/16/20 1025  WBC 11.9* 11.9* 10.5 9.2 7.7  HGB 8.9* 8.9* 8.8* 6.8* 6.8*  HCT 28.3* 28.6* 28.1* 22.1* 21.9*  MCV 94.6 95.3 96.2 97.4 96.1  PLT 399 418* 388 313 417   Basic Metabolic Panel: Recent Labs  Lab 03/11/20 0143 03/12/20 0450 03/14/20 0420 03/15/20 0415 03/16/20 0350  NA 136 139 140 138 138  K 5.2* 4.9 4.8 4.7 4.8  CL 105 107 109 110 111  CO2 22 25 22  20* 19*  GLUCOSE 223* 163* 138* 161* 139*  BUN 58* 47* 24* 21 23  CREATININE 1.84* 1.49* 1.21 1.33* 1.41*  CALCIUM 7.8* 8.0* 7.7* 7.7* 7.4*  MG  --  2.0 1.4* 1.6* 1.7  Studies: CT HIP LEFT WO CONTRAST  Result Date: 03/16/2020 CLINICAL DATA:  Clinical concern for septic arthritis EXAM: CT OF THE LEFT HIP WITHOUT CONTRAST TECHNIQUE: Multidetector CT imaging of the left hip was performed according to the standard protocol. Multiplanar CT image reconstructions were also generated. COMPARISON:  03/01/2020 FINDINGS: Bones/Joint/Cartilage Status post left total hip arthroplasty. Arthroplasty components are in their expected alignment. No evidence of acute periprosthetic fracture. Similar periprosthetic lucency along the posterior cortex of the proximal femoral stem measuring approximately 7.2 cm in length (series 8, image 59), similar to prior. Similar degree of periprosthetic lucency surrounding  the anterolateral aspect of the proximal femur (series 3, image 89). No air is seen within bone, as was evident on the previous study. Large complex fluid and air containing collection surrounding the proximal femoral component measuring approximately 13 x 7.5 x 10 cm (series 4, image 85; series 9, image 76). There is air surrounding the femoral component at the level of the femoral neck. Again seen is extension of ill-defined fluid and air within the left adductor muscle compartment (series 4, image 95). New fluid and air containing collection is now seen within the anterior compartment musculature of the proximal to mid left thigh, which appears centered in the rectus femoris muscle. This collection measures approximately 14.0 x 3.1 x 8.3 cm (series 10, image 65; series 4, image 139). Ligaments Suboptimally assessed by CT. Muscles and Tendons Intramuscular fluid collection within the rectus femoris muscle, as above. Ill-defined adductor compartment collection, as above. Soft tissues Circumferential subcutaneous edema most pronounced at the anterolateral aspect of the proximal thigh. IMPRESSION: 1. Status post left total hip arthroplasty with similar degree of periprosthetic lucency surrounding the proximal femoral component concerning for septic loosening. 2. Large complex fluid and air containing collection surrounding the proximal femoral component measuring up to 13 cm, compatible with infection/abscess. 3. New large fluid and air containing collection within the anterior compartment musculature of the proximal to mid left thigh, which appears centered in the rectus femoris muscle. Collection measures up to 14 cm in length. Findings are compatible with an intramuscular abscess. Electronically Signed   By: Davina Poke D.O.   On: 03/16/2020 09:56   VAS [place] LOWER EXTREMITY VENOUS (DVT)  Result Date: 03/16/2020  Lower Venous DVT Study Indications: Edema.  Comparison Study: no prior Performing Technologist:  Abram Sander RVS  Examination Guidelines: A complete evaluation includes B-mode imaging, spectral Doppler, color Doppler, and power Doppler as needed of all accessible portions of each vessel. Bilateral testing is considered an integral part of a complete examination. Limited examinations for reoccurring indications may be performed as noted. The reflux portion of the exam is performed with the patient in reverse Trendelenburg.  +-----+---------------+---------+-----------+----------+--------------+ RIGHTCompressibilityPhasicitySpontaneityPropertiesThrombus Aging +-----+---------------+---------+-----------+----------+--------------+ CFV  Full           Yes      Yes                                 +-----+---------------+---------+-----------+----------+--------------+   +---------+---------------+---------+-----------+----------+-------------------+ LEFT     CompressibilityPhasicitySpontaneityPropertiesThrombus Aging      +---------+---------------+---------+-----------+----------+-------------------+ CFV      Full           Yes      Yes                                      +---------+---------------+---------+-----------+----------+-------------------+  SFJ      Full                                                             +---------+---------------+---------+-----------+----------+-------------------+ FV Prox  Full                                                             +---------+---------------+---------+-----------+----------+-------------------+ FV Mid   Full                                                             +---------+---------------+---------+-----------+----------+-------------------+ FV DistalFull                                                             +---------+---------------+---------+-----------+----------+-------------------+ PFV      Full                                                              +---------+---------------+---------+-----------+----------+-------------------+ POP      Full           Yes      Yes                                      +---------+---------------+---------+-----------+----------+-------------------+ PTV      Full                                                             +---------+---------------+---------+-----------+----------+-------------------+ PERO                                                  Not well visualized +---------+---------------+---------+-----------+----------+-------------------+     Summary: RIGHT: - No evidence of common femoral vein obstruction.  LEFT: - There is no evidence of deep vein thrombosis in the lower extremity.  - No cystic structure found in the popliteal fossa.  *See table(s) above for measurements and observations. Electronically signed by Ruta Hinds MD on 03/16/2020 at 5:02:47 PM.    Final     Scheduled Meds: . amiodarone  200 mg Oral Daily  . atorvastatin  40 mg Oral Daily  .  Chlorhexidine Gluconate Cloth  6 each Topical Daily  . dextromethorphan  30 mg Oral BID  . diltiazem  120 mg Oral Daily  . ferrous sulfate  325 mg Oral TID WC  . gabapentin  300 mg Oral TID  . insulin aspart  0-15 Units Subcutaneous TID WC  . insulin aspart  0-5 Units Subcutaneous QHS  . lip balm  1 application Topical BID  . metoprolol tartrate  25 mg Oral BID  . pantoprazole  40 mg Oral Daily  . psyllium  1 packet Oral QODAY  . simethicone  80 mg Oral QID  . sodium chloride flush  10-40 mL Intracatheter Q12H  . sodium chloride flush  5 mL Intracatheter Q8H   Continuous Infusions: . ceFEPime (MAXIPIME) IV 2 g (03/16/20 1358)  . [START ON 03/17/2020] ciprofloxacin     PRN Meds: acetaminophen, albuterol, alum & mag hydroxide-simeth, benzonatate, HYDROmorphone (DILAUDID) injection, menthol-cetylpyridinium, methocarbamol, metoprolol tartrate, ondansetron **OR** ondansetron (ZOFRAN) IV, oxyCODONE-acetaminophen, phenol,  sodium chloride flush  Time spent: 35 minutes  Author: Berle Mull, MD [facility] 03/16/2020 7:40 PM  To reach On-call, see care teams to locate the attending and reach out via [email or web address]. Between 7PM-7AM, please contact night-coverage If you still have difficulty reaching the attending provider, please page the [facility] (Director on Call) for [facility] on amion for assistance.

## 2020-03-16 NOTE — Progress Notes (Signed)
Pt alert and aware sitting up in chair. He states he is doing so/so.  He has some discomfort. The nurse asked me to visit with pt. The pt was happy for the visit. He told me where he goes to church when he is home. We talked about the difference in the worship experience now compared to yesterday.  He loves the hymns of the church. The chaplain offered caring and supportive presence, sacred music and prayers and parting blessings. Further follow-up will be offered.

## 2020-03-16 NOTE — Progress Notes (Signed)
PT Cancellation Note  Patient Details Name: [name] MRN: [id number] DOB: [birth year]   Cancelled Treatment:      HgB 6.8 will allow pt to receive full 2 units and attempt to see tomorrow.   Nathanial Rancher 03/16/2020, 4:25 PM

## 2020-03-16 NOTE — Progress Notes (Signed)
Lower extremity venous has been completed.   Preliminary results in CV Proc.   Abram Sander 03/16/2020 9:46 AM

## 2020-03-16 NOTE — Progress Notes (Signed)
   Subjective: 7 Days Post-Op Procedure(s) (LRB): INCISION AND DRAINAGE LEFT HIP WITH LINER EXCHANGE (Left) Patient reports pain as mild.   Patient seen in rounds for Dr. Wynelle Link. Patient is well, and has had no acute complaints or problems. Reports his lateral hip is sore, encouraged him to ask for pain medication as needed. Doing well with physical therapy. Denies chest pain or SOB. No issues overnight.  Objective: Vital signs in last 24 hours: Temp:  [98.3 F (36.8 C)-99.1 F (37.3 C)] 98.6 F (37 C) (12/06 0516) Pulse Rate:  [73-79] 73 (12/06 0516) Resp:  [16-20] 16 (12/06 0516) BP: (115-141)/(58-59) 115/58 (12/06 0516) SpO2:  [98 %-100 %] 98 % (12/06 0516) Weight:  [106.9 kg] 106.9 kg (12/06 0500)  Intake/Output from previous day:  Intake/Output Summary (Last 24 hours) at 03/16/2020 0931 Last data filed at 03/16/2020 0751 Gross per 24 hour  Intake 754.9 ml  Output 600 ml  Net 154.9 ml    Intake/Output this shift: Total I/O In: -  Out: 100 [Urine:100]  Labs: Recent Labs    03/16/20 0350  HGB 6.8*   Recent Labs    03/16/20 0350  WBC 9.2  RBC 2.27*  HCT 22.1*  PLT 313   Recent Labs    03/15/20 0415 03/16/20 0350  NA 138 138  K 4.7 4.8  CL 110 111  CO2 20* 19*  BUN 21 23  CREATININE 1.33* 1.41*  GLUCOSE 161* 139*  CALCIUM 7.7* 7.4*   No results for input(s): LABPT, INR in the last 72 hours.  Exam: General - Patient is Alert and Oriented Extremity - Neurologically intact Neurovascular intact Sensation intact distally Dorsiflexion/Plantar flexion intact Dressing/Incision - clean, dry, minimal bloody drainage pooled underneath Aquacel. Motor Function - intact, moving foot and toes well on exam.   Past Medical History:  Diagnosis Date  . Arthritis   . Asthma    no inhaler  . Coronary artery disease    cardiologist-  dr spruill; last visit 3 mos ago per pt  . GERD (gastroesophageal reflux disease)   . History of MI (myocardial infarction)     1985  . Hydronephrosis, left   . Hypertension   . Myocardial infarction ([place])   . Nephrolithiasis    left  . 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 Presence of tooth-root and mandibular implants    lower dental implants  . Prostate cancer ([place])   . Shortness of breath    WITH EXERTION  . Type 2 diabetes mellitus (HCC)     Assessment/Plan: 7 Days Post-Op Procedure(s) (LRB): INCISION AND DRAINAGE LEFT HIP WITH LINER EXCHANGE (Left) Active Problems:   OA (osteoarthritis) of hip   Type 2 diabetes mellitus with hypoglycemia without coma (HCC)   Essential hypertension   Hyperlipidemia with target LDL less than 70   Hip pain   Effusion of hip joint, left   Abdominal distension   SBO (small bowel obstruction) (HCC)   Bacteremia  Estimated body mass index is 34.8 kg/m as calculated from the following:   Height as of this encounter: 5\' 9"  (1.753 m).   Weight as of this encounter: 106.9 kg. Up with therapy  DVT Prophylaxis - Nothing ordered currently. Getting hospitalists input, may be temporarily on hold due to procedure tomorrow. Weight-bearing as tolerated  Stent exchange scheduled for tomorrow. Will continue to follow.  Theresa Duty, PA-C Orthopedic Surgery [phone] 03/16/2020, 9:31 AM

## 2020-03-16 NOTE — Progress Notes (Signed)
[facility] for Infectious Disease   Reason for visit: Follow up on prosthetic hip infection  Interval History: has had some left thigh swelling and warmth and CT scan concerning for abscess.  No rash.  Day 12 cefepime    Physical Exam: Constitutional:  Vitals:   03/16/20 1337 03/16/20 1402  BP: 119/67 (!) 122/55  Pulse:  78  Resp:  20  Temp: 98.6 F (37 C) 98.2 F (36.8 C)  SpO2:  98%   patient appears in NAD Respiratory: Normal respiratory effort; CTA B Cardiovascular: RRR MS: left leg warm compared to right, taught/swollen.    Review of Systems: Constitutional: negative for fevers and chills Gastrointestinal: negative for nausea and diarrhea  Lab Results  Component Value Date   WBC 7.7 03/16/2020   HGB 6.8 (LL) 03/16/2020   HCT 21.9 (L) 03/16/2020   MCV 96.1 03/16/2020   PLT 311 03/16/2020    Lab Results  Component Value Date   CREATININE 1.41 (H) 03/16/2020   BUN 23 03/16/2020   NA 138 03/16/2020   K 4.8 03/16/2020   CL 111 03/16/2020   CO2 19 (L) 03/16/2020    Lab Results  Component Value Date   ALT 49 (H) 03/12/2020   AST 35 03/12/2020   ALKPHOS 83 03/12/2020     Microbiology: Recent Results (from the past 240 hour(s))  Culture, blood (Routine X 2) w Reflex to ID Panel     Status: None   Collection Time: 03/09/20 12:50 PM   Specimen: BLOOD  Result Value Ref Range Status   Specimen Description   Final    BLOOD RIGHT WRIST Performed at [facility], [place] [address] [place], [place] [address]    Special Requests   Final    BOTTLES DRAWN AEROBIC ONLY Blood Culture adequate volume Performed at [facility] [address] [place], [place] [address]    Culture   Final    NO GROWTH 5 DAYS Performed at [facility], [place] [address], [place], [place] [address]    Report Status 03/14/2020 FINAL  Final  Culture, blood (Routine X 2) w Reflex to ID Panel     Status: None   Collection Time: 03/09/20  12:50 PM   Specimen: BLOOD RIGHT HAND  Result Value Ref Range Status   Specimen Description   Final    BLOOD RIGHT HAND Performed at [facility] [address], [place], [place] [address]    Special Requests   Final    BOTTLES DRAWN AEROBIC AND ANAEROBIC Blood Culture adequate volume Performed at [facility] [address] [place], [place] [address]    Culture   Final    NO GROWTH 5 DAYS Performed at [facility], [place] [address], [place], [place] [address]    Report Status 03/14/2020 FINAL  Final  Aerobic/Anaerobic Culture (surgical/deep wound)     Status: None   Collection Time: 03/09/20  3:53 PM   Specimen: PATH Other; Tissue  Result Value Ref Range Status   Specimen Description   Final    ABSCESS RIGHT HIP Performed at [facility] [address], [place], [place] [address]    Special Requests   Final    NONE Performed at [facility], [place] [address], [place], [place] [address]    Gram Stain   Final    FEW WBC PRESENT,BOTH PMN AND MONONUCLEAR FEW GRAM NEGATIVE RODS RARE [place] Grandchild  POSITIVE COCCI    Culture   Final    FEW ENTEROBACTER CLOACAE NO ANAEROBES ISOLATED Performed at [facility], [place] [address], [place], [place] [address]    Report Status 03/14/2020 FINAL  Final   Organism ID, Bacteria ENTEROBACTER CLOACAE  Final      Susceptibility   Enterobacter cloacae - MIC*    CEFAZOLIN >=64 RESISTANT Resistant     CEFEPIME <=0.12 SENSITIVE Sensitive     CEFTAZIDIME <=1 SENSITIVE Sensitive     CIPROFLOXACIN <=0.25 SENSITIVE Sensitive     GENTAMICIN <=1 SENSITIVE Sensitive     IMIPENEM 0.5 SENSITIVE Sensitive     TRIMETH/SULFA <=20 SENSITIVE Sensitive     PIP/TAZO <=4 SENSITIVE Sensitive     * FEW ENTEROBACTER CLOACAE    Impression/Plan:  1. New left leg swelling with concern for abscess on CT - I recommend aspiration by IR or orthopedic evaluation of the leg with a large  fluid area concerning for infection in the same area of his current infection.    2.  Hip infection - plan for cefepime through January 10th.

## 2020-03-16 NOTE — Progress Notes (Signed)
7 Days Post-Op Subjective: Patient reports he is voiding well. No dysuria. No fever.  Objective: Vital signs in last 24 hours: Temp:  [98.3 F (36.8 C)-99.1 F (37.3 C)] 98.6 F (37 C) (12/06 1337) Pulse Rate:  [73-100] 100 (12/06 1138) Resp:  [11-20] 11 (12/06 1138) BP: (115-141)/(58-78) 119/67 (12/06 1337) SpO2:  [98 %-100 %] 99 % (12/06 1138) Weight:  [106.9 kg] 106.9 kg (12/06 0500)  Intake/Output from previous day: 12/05 0701 - 12/06 0700 In: 754.9 [P.O.:240; IV Piggyback:514.9] Out: 500 [Urine:500] Intake/Output this shift: Total I/O In: 500 [P.O.:500] Out: 350 [Urine:350]  Physical Exam:  NAD Sitting in chair  Watching TV   Lab Results: Recent Labs    03/16/20 0350 03/16/20 1025  HGB 6.8* 6.8*  HCT 22.1* 21.9*   BMET Recent Labs    03/15/20 0415 03/16/20 0350  NA 138 138  K 4.7 4.8  CL 110 111  CO2 20* 19*  GLUCOSE 161* 139*  BUN 21 23  CREATININE 1.33* 1.41*  CALCIUM 7.7* 7.4*   No results for input(s): LABPT, INR in the last 72 hours. No results for input(s): LABURIN in the last 72 hours. Results for orders placed or performed during the hospital encounter of 03/03/20  Culture, blood (routine x 2)     Status: Abnormal   Collection Time: 03/03/20  7:04 PM   Specimen: BLOOD LEFT HAND  Result Value Ref Range Status   Specimen Description   Final    BLOOD LEFT HAND Performed at [facility] [address], [place], [place] [address]    Special Requests   Final    BOTTLES DRAWN AEROBIC ONLY Blood Culture adequate volume Performed at [facility] [address], [place], [place] [address]    Culture  Setup Time   Final    AEROBIC BOTTLE ONLY GRAM NEGATIVE RODS CRITICAL RESULT CALLED TO, READ BACK BY AND VERIFIED WITH: Ulice Bold PharmD 9:20 03/06/20 ([email or web address]) Performed at [facility], [place] [address], [place], [place] [address]    Culture ENTEROBACTER CLOACAE (A)  Final   Report Status 03/08/2020  FINAL  Final   Organism ID, Bacteria ENTEROBACTER CLOACAE  Final      Susceptibility   Enterobacter cloacae - MIC*    CEFAZOLIN >=64 RESISTANT Resistant     CEFEPIME <=0.12 SENSITIVE Sensitive     CEFTAZIDIME <=1 SENSITIVE Sensitive     CIPROFLOXACIN <=0.25 SENSITIVE Sensitive     GENTAMICIN <=1 SENSITIVE Sensitive     IMIPENEM <=0.25 SENSITIVE Sensitive     TRIMETH/SULFA <=20 SENSITIVE Sensitive     PIP/TAZO <=4 SENSITIVE Sensitive     * ENTEROBACTER CLOACAE  Blood Culture ID Panel (Reflexed)     Status: Abnormal   Collection Time: 03/03/20  7:04 PM  Result Value Ref Range Status   Enterococcus faecalis NOT DETECTED NOT DETECTED Final   Enterococcus Faecium NOT DETECTED NOT DETECTED Final   Listeria monocytogenes NOT DETECTED NOT DETECTED Final   Staphylococcus species NOT DETECTED NOT DETECTED Final   Staphylococcus aureus (BCID) NOT DETECTED NOT DETECTED Final   Staphylococcus epidermidis NOT DETECTED NOT DETECTED Final   Staphylococcus lugdunensis NOT DETECTED NOT DETECTED Final   Streptococcus species NOT DETECTED NOT DETECTED Final   Streptococcus agalactiae NOT DETECTED NOT DETECTED Final   Streptococcus pneumoniae NOT DETECTED NOT DETECTED Final   Streptococcus pyogenes NOT DETECTED NOT DETECTED Final   A.calcoaceticus-baumannii NOT DETECTED NOT DETECTED Final   Bacteroides fragilis NOT DETECTED NOT  DETECTED Final   Enterobacterales DETECTED (A) NOT DETECTED Final    Comment: Enterobacterales represent a large order of gram negative bacteria, not a single organism. CRITICAL RESULT CALLED TO, READ BACK BY AND VERIFIED WITH: Ulice Bold PharmD 9:20 03/06/20 ([email or web address])    Enterobacter cloacae complex DETECTED (A) NOT DETECTED Final    Comment: CRITICAL RESULT CALLED TO, READ BACK BY AND VERIFIED WITH: Ulice Bold PharmD 9:20 03/06/20 ([email or web address])    Escherichia coli NOT DETECTED NOT DETECTED Final   Klebsiella aerogenes NOT DETECTED NOT DETECTED Final   Klebsiella oxytoca NOT  DETECTED NOT DETECTED Final   Klebsiella pneumoniae NOT DETECTED NOT DETECTED Final   Proteus species NOT DETECTED NOT DETECTED Final   Salmonella species NOT DETECTED NOT DETECTED Final   Serratia marcescens NOT DETECTED NOT DETECTED Final   Haemophilus influenzae NOT DETECTED NOT DETECTED Final   Neisseria meningitidis NOT DETECTED NOT DETECTED Final   Pseudomonas aeruginosa NOT DETECTED NOT DETECTED Final   Stenotrophomonas maltophilia NOT DETECTED NOT DETECTED Final   Candida albicans NOT DETECTED NOT DETECTED Final   Candida auris NOT DETECTED NOT DETECTED Final   Candida glabrata NOT DETECTED NOT DETECTED Final   Candida krusei NOT DETECTED NOT DETECTED Final   Candida parapsilosis NOT DETECTED NOT DETECTED Final   Candida tropicalis NOT DETECTED NOT DETECTED Final   Cryptococcus neoformans/gattii NOT DETECTED NOT DETECTED Final   CTX-M ESBL NOT DETECTED NOT DETECTED Final   Carbapenem resistance IMP NOT DETECTED NOT DETECTED Final   Carbapenem resistance KPC NOT DETECTED NOT DETECTED Final   Carbapenem resistance NDM NOT DETECTED NOT DETECTED Final   Carbapenem resist OXA 48 LIKE NOT DETECTED NOT DETECTED Final   Carbapenem resistance VIM NOT DETECTED NOT DETECTED Final    Comment: Performed at [facility], [address], [place], [place] [address]  Culture, blood (routine x 2)     Status: None   Collection Time: 03/03/20  7:06 PM   Specimen: BLOOD RIGHT HAND  Result Value Ref Range Status   Specimen Description   Final    BLOOD RIGHT HAND Performed at [facility] [address], [place], [place] [address]    Special Requests   Final    BOTTLES DRAWN AEROBIC ONLY Blood Culture adequate volume Performed at [facility] [address] [place], [place] [address]    Culture   Final    NO GROWTH 5 DAYS Performed at [facility], [place] [address], [place], [place] [address]    Report Status 03/08/2020 FINAL  Final   Aerobic/Anaerobic Culture (surgical/deep wound)     Status: None   Collection Time: 03/04/20  3:38 PM   Specimen: Abscess  Result Value Ref Range Status   Specimen Description   Final    ABSCESS DRAIN LEFT LATERAL THIGH Performed at [facility] [address], [place], [place] [address]    Special Requests   Final    NONE Performed at [facility], [place] [address] [place], [place] [address]    Gram Stain   Final    ABUNDANT WBC PRESENT,BOTH PMN AND MONONUCLEAR ABUNDANT GRAM NEGATIVE RODS    Culture   Final    ABUNDANT ENTEROBACTER CLOACAE NO ANAEROBES ISOLATED Performed at [facility], [place] [address], [place],  [address]    Report Status 03/09/2020 FINAL  Final   Organism ID, Bacteria ENTEROBACTER CLOACAE  Final      Susceptibility  Enterobacter cloacae - MIC*    CEFAZOLIN >=64 RESISTANT Resistant     CEFEPIME <=0.12 SENSITIVE Sensitive     CEFTAZIDIME <=1 SENSITIVE Sensitive     CIPROFLOXACIN <=0.25 SENSITIVE Sensitive     GENTAMICIN <=1 SENSITIVE Sensitive     IMIPENEM 0.5 SENSITIVE Sensitive     TRIMETH/SULFA <=20 SENSITIVE Sensitive     PIP/TAZO <=4 SENSITIVE Sensitive     * ABUNDANT ENTEROBACTER CLOACAE  Culture, Urine     Status: Abnormal   Collection Time: 03/05/20  8:10 AM   Specimen: Urine, Random  Result Value Ref Range Status   Specimen Description   Final    URINE, RANDOM Performed at [facility] [place] [address] [place], [place] [address]    Special Requests   Final    NONE Performed at [facility], [place] [address], [place], [place] [address]    Culture >=100,000 COLONIES/mL ENTEROBACTER SPECIES (A)  Final   Report Status 03/07/2020 FINAL  Final   Organism ID, Bacteria ENTEROBACTER SPECIES (A)  Final      Susceptibility   Enterobacter species - MIC*    CEFAZOLIN >=64 RESISTANT Resistant     CEFEPIME <=0.12 SENSITIVE Sensitive     CEFTRIAXONE <=0.25  SENSITIVE Sensitive     CIPROFLOXACIN <=0.25 SENSITIVE Sensitive     GENTAMICIN <=1 SENSITIVE Sensitive     IMIPENEM 0.5 SENSITIVE Sensitive     NITROFURANTOIN 32 SENSITIVE Sensitive     TRIMETH/SULFA <=20 SENSITIVE Sensitive     PIP/TAZO <=4 SENSITIVE Sensitive     * >=100,000 COLONIES/mL ENTEROBACTER SPECIES  Culture, blood (Routine X 2) w Reflex to ID Panel     Status: None   Collection Time: 03/09/20 12:50 PM   Specimen: BLOOD  Result Value Ref Range Status   Specimen Description   Final    BLOOD RIGHT WRIST Performed at [facility] [address] [place], [place] [address]    Special Requests   Final    BOTTLES DRAWN AEROBIC ONLY Blood Culture adequate volume Performed at [facility] [address], [place], [place] [address]    Culture   Final    NO GROWTH 5 DAYS Performed at [facility], [place] [address], [place], [place] [address]    Report Status 03/14/2020 FINAL  Final  Culture, blood (Routine X 2) w Reflex to ID Panel     Status: None   Collection Time: 03/09/20 12:50 PM   Specimen: BLOOD RIGHT HAND  Result Value Ref Range Status   Specimen Description   Final    BLOOD RIGHT HAND Performed at [facility] [address] [place], [place] [address]    Special Requests   Final    BOTTLES DRAWN AEROBIC AND ANAEROBIC Blood Culture adequate volume Performed at [facility] [address] [place], [place] [address]    Culture   Final    NO GROWTH 5 DAYS Performed at [facility], [place] [address], [place],  [address]    Report Status 03/14/2020 FINAL  Final  Aerobic/Anaerobic Culture (surgical/deep wound)     Status: None   Collection Time: 03/09/20  3:53 PM   Specimen: PATH Other; Tissue  Result Value Ref Range Status   Specimen Description   Final    ABSCESS RIGHT HIP Performed at [facility] [address], [place],  [address]     Special Requests   Final  NONE Performed at [facility], [place] [address], [place], [place] [address]    Gram Stain   Final    FEW WBC PRESENT,BOTH PMN AND MONONUCLEAR FEW GRAM NEGATIVE RODS RARE GRAM POSITIVE COCCI    Culture   Final    FEW ENTEROBACTER CLOACAE NO ANAEROBES ISOLATED Performed at [facility], [place] [address], [place], [place] [address]    Report Status 03/14/2020 FINAL  Final   Organism ID, Bacteria ENTEROBACTER CLOACAE  Final      Susceptibility   Enterobacter cloacae - MIC*    CEFAZOLIN >=64 RESISTANT Resistant     CEFEPIME <=0.12 SENSITIVE Sensitive     CEFTAZIDIME <=1 SENSITIVE Sensitive     CIPROFLOXACIN <=0.25 SENSITIVE Sensitive     GENTAMICIN <=1 SENSITIVE Sensitive     IMIPENEM 0.5 SENSITIVE Sensitive     TRIMETH/SULFA <=20 SENSITIVE Sensitive     PIP/TAZO <=4 SENSITIVE Sensitive     * FEW ENTEROBACTER CLOACAE    Studies/Results: CT HIP LEFT WO CONTRAST  Result Date: 03/16/2020 CLINICAL DATA:  Clinical concern for septic arthritis EXAM: CT OF THE LEFT HIP WITHOUT CONTRAST TECHNIQUE: Multidetector CT imaging of the left hip was performed according to the standard protocol. Multiplanar CT image reconstructions were also generated. COMPARISON:  03/01/2020 FINDINGS: Bones/Joint/Cartilage Status post left total hip arthroplasty. Arthroplasty components are in their expected alignment. No evidence of acute periprosthetic fracture. Similar periprosthetic lucency along the posterior cortex of the proximal femoral stem measuring approximately 7.2 cm in length (series 8, image 59), similar to prior. Similar degree of periprosthetic lucency surrounding the anterolateral aspect of the proximal femur (series 3, image 89). No air is seen within bone, as was evident on the previous study. Large complex fluid and air containing collection surrounding the proximal femoral component measuring approximately 13 x 7.5 x 10 cm (series 4, image 85; series  9, image 76). There is air surrounding the femoral component at the level of the femoral neck. Again seen is extension of ill-defined fluid and air within the left adductor muscle compartment (series 4, image 95). New fluid and air containing collection is now seen within the anterior compartment musculature of the proximal to mid left thigh, which appears centered in the rectus femoris muscle. This collection measures approximately 14.0 x 3.1 x 8.3 cm (series 10, image 65; series 4, image 139). Ligaments Suboptimally assessed by CT. Muscles and Tendons Intramuscular fluid collection within the rectus femoris muscle, as above. Ill-defined adductor compartment collection, as above. Soft tissues Circumferential subcutaneous edema most pronounced at the anterolateral aspect of the proximal thigh. IMPRESSION: 1. Status post left total hip arthroplasty with similar degree of periprosthetic lucency surrounding the proximal femoral component concerning for septic loosening. 2. Large complex fluid and air containing collection surrounding the proximal femoral component measuring up to 13 cm, compatible with infection/abscess. 3. New large fluid and air containing collection within the anterior compartment musculature of the proximal to mid left thigh, which appears centered in the rectus femoris muscle. Collection measures up to 14 cm in length. Findings are compatible with an intramuscular abscess. Electronically Signed   By: Davina Poke D.O.   On: 03/16/2020 09:56   VAS [place] LOWER EXTREMITY VENOUS (DVT)  Result Date: 03/16/2020  Lower Venous DVT Study Indications: Edema.  Comparison Study: no prior Performing Technologist: Abram Sander RVS  Examination Guidelines: A complete evaluation includes B-mode imaging, spectral Doppler, color Doppler, and power Doppler as needed of all accessible portions of  each vessel. Bilateral testing is considered an integral part of a complete examination. Limited examinations for  reoccurring indications may be performed as noted. The reflux portion of the exam is performed with the patient in reverse Trendelenburg.  +-----+---------------+---------+-----------+----------+--------------+ RIGHTCompressibilityPhasicitySpontaneityPropertiesThrombus Aging +-----+---------------+---------+-----------+----------+--------------+ CFV  Full           Yes      Yes                                 +-----+---------------+---------+-----------+----------+--------------+   +---------+---------------+---------+-----------+----------+-------------------+ LEFT     CompressibilityPhasicitySpontaneityPropertiesThrombus Aging      +---------+---------------+---------+-----------+----------+-------------------+ CFV      Full           Yes      Yes                                      +---------+---------------+---------+-----------+----------+-------------------+ SFJ      Full                                                             +---------+---------------+---------+-----------+----------+-------------------+ FV Prox  Full                                                             +---------+---------------+---------+-----------+----------+-------------------+ FV Mid   Full                                                             +---------+---------------+---------+-----------+----------+-------------------+ FV DistalFull                                                             +---------+---------------+---------+-----------+----------+-------------------+ PFV      Full                                                             +---------+---------------+---------+-----------+----------+-------------------+ POP      Full           Yes      Yes                                      +---------+---------------+---------+-----------+----------+-------------------+ PTV      Full                                                              +---------+---------------+---------+-----------+----------+-------------------+  PERO                                                  Not well visualized +---------+---------------+---------+-----------+----------+-------------------+     Summary: RIGHT: - No evidence of common femoral vein obstruction.  LEFT: - There is no evidence of deep vein thrombosis in the lower extremity.  - No cystic structure found in the popliteal fossa.  *See table(s) above for measurements and observations.    Preliminary     Assessment/Plan: Bilateral ureteral stents, left ureteral stone with Enterobacter UTI, bacteremia and infected hip-planning for cystoscopy, right retrograde pyelogram with stent removal, left ureteroscopy with laser lithotripsy of the distal stone, stent exchange and possible foley (temporary for a few days post-op) - I discussed again with patient the nature r/b/a to the procedure. Planning a dose of Cipro IV pre-op. All questions answered. Procedure tomorrow around 3:30 pm. NPO p MN. CT hip reviewed with Dr. Posey Pronto with continued plans for IV abx per his discussion with ortho. Pt getting 2 U PRBC.     LOS: 12 days   Festus Aloe 03/16/2020, 1:52 PM

## 2020-03-16 NOTE — Plan of Care (Signed)
  Problem: Clinical Measurements: Goal: Will remain free from infection Outcome: Progressing Goal: Diagnostic test results will improve Outcome: Progressing   Problem: Activity: Goal: Risk for activity intolerance will decrease Outcome: Progressing   Problem: Nutrition: Goal: Adequate nutrition will be maintained Outcome: Progressing   Problem: Pain Managment: Goal: General experience of comfort will improve Outcome: Progressing   Problem: Safety: Goal: Ability to remain free from injury will improve Outcome: Progressing   Problem: Skin Integrity: Goal: Risk for impaired skin integrity will decrease Outcome: Progressing   Problem: Activity: Goal: Risk for activity intolerance will decrease Outcome: Progressing

## 2020-03-17 ENCOUNTER — Inpatient Hospital Stay (HOSPITAL_COMMUNITY): Payer: HMO | Admitting: Anesthesiology

## 2020-03-17 ENCOUNTER — Encounter (HOSPITAL_COMMUNITY)
Admission: AD | Disposition: A | Discharge status: Skilled Nursing Facility | Payer: Self-pay | Source: Ambulatory Visit | Attending: Internal Medicine

## 2020-03-17 ENCOUNTER — Inpatient Hospital Stay (HOSPITAL_COMMUNITY): Payer: HMO

## 2020-03-17 DIAGNOSIS — L02416 Cutaneous abscess of left lower limb: Secondary | ICD-10-CM

## 2020-03-17 HISTORY — PX: CYSTOSCOPY/URETEROSCOPY/HOLMIUM LASER/STENT PLACEMENT: SHX6546

## 2020-03-17 HISTORY — PX: IR US GUIDE BX ASP/DRAIN: IMG2392

## 2020-03-17 LAB — GLUCOSE, CAPILLARY
Glucose-Capillary: 119 mg/dL — ABNORMAL HIGH (ref 70–99)
Glucose-Capillary: 121 mg/dL — ABNORMAL HIGH (ref 70–99)
Glucose-Capillary: 122 mg/dL — ABNORMAL HIGH (ref 70–99)
Glucose-Capillary: 139 mg/dL — ABNORMAL HIGH (ref 70–99)
Glucose-Capillary: 190 mg/dL — ABNORMAL HIGH (ref 70–99)

## 2020-03-17 LAB — CBC
HCT: 24.8 % — ABNORMAL LOW (ref 39.0–52.0)
Hemoglobin: 8 g/dL — ABNORMAL LOW (ref 13.0–17.0)
MCH: 30.2 pg (ref 26.0–34.0)
MCHC: 32.3 g/dL (ref 30.0–36.0)
MCV: 93.6 fL (ref 80.0–100.0)
Platelets: 271 10*3/uL (ref 150–400)
RBC: 2.65 MIL/uL — ABNORMAL LOW (ref 4.22–5.81)
RDW: 16.1 % — ABNORMAL HIGH (ref 11.5–15.5)
WBC: 6.5 10*3/uL (ref 4.0–10.5)
nRBC: 0 % (ref 0.0–0.2)

## 2020-03-17 LAB — MAGNESIUM: Magnesium: 1.5 mg/dL — ABNORMAL LOW (ref 1.7–2.4)

## 2020-03-17 LAB — BASIC METABOLIC PANEL
Anion gap: 7 (ref 5–15)
BUN: 22 mg/dL (ref 8–23)
CO2: 21 mmol/L — ABNORMAL LOW (ref 22–32)
Calcium: 7.6 mg/dL — ABNORMAL LOW (ref 8.9–10.3)
Chloride: 110 mmol/L (ref 98–111)
Creatinine, Ser: 1.28 mg/dL — ABNORMAL HIGH (ref 0.61–1.24)
GFR, Estimated: 59 mL/min — ABNORMAL LOW (ref >60)
Glucose, Bld: 142 mg/dL — ABNORMAL HIGH (ref 70–99)
Potassium: 4.7 mmol/L (ref 3.5–5.1)
Sodium: 138 mmol/L (ref 135–145)

## 2020-03-17 SURGERY — CYSTOSCOPY/URETEROSCOPY/HOLMIUM LASER/STENT PLACEMENT
Anesthesia: General | Laterality: Bilateral

## 2020-03-17 MED ORDER — ACETAMINOPHEN 160 MG/5ML PO SOLN: 325.0000 mg | ORAL | Status: DC | PRN

## 2020-03-17 MED ORDER — PHENYLEPHRINE 40 MCG/ML (10ML) SYRINGE FOR IV PUSH (FOR BLOOD PRESSURE SUPPORT)
PREFILLED_SYRINGE | INTRAVENOUS | Status: DC | PRN
  Administered 2020-03-17 (×2): 120 ug via INTRAVENOUS
  Administered 2020-03-17: 80 ug via INTRAVENOUS
  Administered 2020-03-17 (×3): 120 ug via INTRAVENOUS

## 2020-03-17 MED ORDER — SODIUM CHLORIDE 0.9% FLUSH
5.0000 mL | Freq: Three times a day (TID) | INTRAVENOUS | Status: DC
  Administered 2020-03-18 – 2020-03-20 (×5): 5 mL

## 2020-03-17 MED ORDER — PROPOFOL 10 MG/ML IV BOLUS
INTRAVENOUS | Status: DC | PRN
  Administered 2020-03-17: 200 mg via INTRAVENOUS

## 2020-03-17 MED ORDER — FENTANYL CITRATE (PF) 100 MCG/2ML IJ SOLN
INTRAMUSCULAR | Status: AC
  Filled 2020-03-17: qty 2

## 2020-03-17 MED ORDER — MEPERIDINE HCL 50 MG/ML IJ SOLN: 6.2500 mg | INTRAMUSCULAR | Status: DC | PRN

## 2020-03-17 MED ORDER — LACTATED RINGERS IV SOLN: INTRAVENOUS | Status: DC

## 2020-03-17 MED ORDER — MAGNESIUM SULFATE 2 GM/50ML IV SOLN
2.0000 g | Freq: Once | INTRAVENOUS | Status: AC
  Administered 2020-03-17: 2 g via INTRAVENOUS
  Filled 2020-03-17: qty 50

## 2020-03-17 MED ORDER — ACETAMINOPHEN 325 MG PO TABS: 325.0000 mg | ORAL_TABLET | ORAL | Status: DC | PRN

## 2020-03-17 MED ORDER — LIDOCAINE HCL 1 % IJ SOLN
INTRAMUSCULAR | Status: AC
  Filled 2020-03-17: qty 20

## 2020-03-17 MED ORDER — IOHEXOL 300 MG/ML  SOLN
INTRAMUSCULAR | Status: DC | PRN
  Administered 2020-03-17: 30 mL

## 2020-03-17 MED ORDER — OXYCODONE HCL 5 MG/5ML PO SOLN: 5.0000 mg | Freq: Once | ORAL | Status: DC | PRN

## 2020-03-17 MED ORDER — SODIUM CHLORIDE 0.9 % IR SOLN
Status: DC | PRN
  Administered 2020-03-17: 3000 mL

## 2020-03-17 MED ORDER — PHENYLEPHRINE 40 MCG/ML (10ML) SYRINGE FOR IV PUSH (FOR BLOOD PRESSURE SUPPORT)
PREFILLED_SYRINGE | INTRAVENOUS | Status: AC
  Filled 2020-03-17: qty 10

## 2020-03-17 MED ORDER — LIDOCAINE HCL (PF) 1 % IJ SOLN
INTRAMUSCULAR | Status: AC | PRN
  Administered 2020-03-17: 5 mL

## 2020-03-17 MED ORDER — DOCUSATE SODIUM 100 MG PO CAPS
100.0000 mg | ORAL_CAPSULE | Freq: Two times a day (BID) | ORAL | Status: DC
  Administered 2020-03-17 – 2020-03-20 (×6): 100 mg via ORAL
  Filled 2020-03-17 (×6): qty 1

## 2020-03-17 MED ORDER — ONDANSETRON HCL 4 MG/2ML IJ SOLN
INTRAMUSCULAR | Status: AC
  Filled 2020-03-17: qty 2

## 2020-03-17 MED ORDER — MIDAZOLAM HCL 2 MG/2ML IJ SOLN
INTRAMUSCULAR | Status: AC | PRN
  Administered 2020-03-17 (×2): 1 mg via INTRAVENOUS

## 2020-03-17 MED ORDER — ONDANSETRON HCL 4 MG/2ML IJ SOLN: 4.0000 mg | Freq: Once | INTRAMUSCULAR | Status: DC | PRN

## 2020-03-17 MED ORDER — DEXAMETHASONE SODIUM PHOSPHATE 4 MG/ML IJ SOLN
INTRAMUSCULAR | Status: DC | PRN
  Administered 2020-03-17: 5 mg via INTRAVENOUS

## 2020-03-17 MED ORDER — DEXAMETHASONE SODIUM PHOSPHATE 10 MG/ML IJ SOLN
INTRAMUSCULAR | Status: AC
  Filled 2020-03-17: qty 1

## 2020-03-17 MED ORDER — PROPOFOL 10 MG/ML IV BOLUS
INTRAVENOUS | Status: AC
  Filled 2020-03-17: qty 20

## 2020-03-17 MED ORDER — FENTANYL CITRATE (PF) 100 MCG/2ML IJ SOLN
INTRAMUSCULAR | Status: AC | PRN
Start: 2020-03-17 — End: 2020-03-17
  Administered 2020-03-17 (×2): 50 ug via INTRAVENOUS

## 2020-03-17 MED ORDER — ONDANSETRON HCL 4 MG/2ML IJ SOLN
INTRAMUSCULAR | Status: DC | PRN
  Administered 2020-03-17: 4 mg via INTRAVENOUS

## 2020-03-17 MED ORDER — EPHEDRINE SULFATE-NACL 50-0.9 MG/10ML-% IV SOSY
PREFILLED_SYRINGE | INTRAVENOUS | Status: DC | PRN
  Administered 2020-03-17 (×2): 10 mg via INTRAVENOUS

## 2020-03-17 MED ORDER — FENTANYL CITRATE (PF) 100 MCG/2ML IJ SOLN: 25.0000 ug | INTRAMUSCULAR | Status: DC | PRN

## 2020-03-17 MED ORDER — FENTANYL CITRATE (PF) 100 MCG/2ML IJ SOLN
INTRAMUSCULAR | Status: DC | PRN
  Administered 2020-03-17 (×2): 50 ug via INTRAVENOUS

## 2020-03-17 MED ORDER — LIDOCAINE HCL (CARDIAC) PF 100 MG/5ML IV SOSY
PREFILLED_SYRINGE | INTRAVENOUS | Status: DC | PRN
  Administered 2020-03-17: 100 mg via INTRAVENOUS

## 2020-03-17 MED ORDER — EPHEDRINE 5 MG/ML INJ
INTRAVENOUS | Status: AC
  Filled 2020-03-17: qty 10

## 2020-03-17 MED ORDER — MIDAZOLAM HCL 2 MG/2ML IJ SOLN
INTRAMUSCULAR | Status: AC
  Filled 2020-03-17: qty 2

## 2020-03-17 MED ORDER — OXYCODONE HCL 5 MG PO TABS: 5.0000 mg | ORAL_TABLET | Freq: Once | ORAL | Status: DC | PRN

## 2020-03-17 SURGICAL SUPPLY — 22 items
BAG URO CATCHER STRL LF (MISCELLANEOUS) ×3 IMPLANT
BASKET ZERO TIP NITINOL 2.4FR (BASKET) IMPLANT
CATH FOLEY 2WAY SLVR  5CC 16FR (CATHETERS) ×3
CATH FOLEY 2WAY SLVR 5CC 16FR (CATHETERS) ×1 IMPLANT
CATH INTERMIT  6FR 70CM (CATHETERS) ×3 IMPLANT
CATH URET 5FR 28IN CONE TIP (BALLOONS)
CATH URET 5FR 70CM CONE TIP (BALLOONS) IMPLANT
CLOTH BEACON ORANGE TIMEOUT ST (SAFETY) ×3 IMPLANT
GLOVE BIO SURGEON STRL SZ7.5 (GLOVE) ×3 IMPLANT
GOWN STRL REUS W/TWL XL LVL3 (GOWN DISPOSABLE) ×3 IMPLANT
GUIDEWIRE STR DUAL SENSOR (WIRE) ×6 IMPLANT
KIT TURNOVER KIT A (KITS) IMPLANT
LASER FIB FLEXIVA PULSE ID 365 (Laser) IMPLANT
MANIFOLD NEPTUNE II (INSTRUMENTS) ×3 IMPLANT
PACK CYSTO (CUSTOM PROCEDURE TRAY) ×3 IMPLANT
SHEATH URETERAL 12FRX28CM (UROLOGICAL SUPPLIES) IMPLANT
SHEATH URETERAL 12FRX35CM (MISCELLANEOUS) IMPLANT
TRACTIP FLEXIVA PULS ID 200XHI (Laser) ×1 IMPLANT
TRACTIP FLEXIVA PULSE ID 200 (Laser) ×3
TUBING CONNECTING 10 (TUBING) ×2 IMPLANT
TUBING CONNECTING 10' (TUBING) ×1
TUBING UROLOGY SET (TUBING) ×3 IMPLANT

## 2020-03-17 NOTE — Progress Notes (Signed)
  [name] presents for cystoscopy with left ureteroscopy laser lithotripsy, stent exchange, right stent removal.  Again I think with all he has going on and after reviewing his CT scan it is probably best not to perform endoscopy up into the collecting systems.  Events of today noted as well as the left thigh drain that was placed.  His labs are stable.  Vital stable.  He is voiding with a good stream and without dysuria.  No fever.  All questions answered and he elects to proceed.  He is stable for surgery.  I had planned to give him a dose of Cipro as a perioperative dose as I did not think the timing would line up for his scheduled antibiotics.  Turns out he is due for cefepime and therefore we will go ahead and give that.  Canceled the Cipro.

## 2020-03-17 NOTE — Anesthesia Procedure Notes (Signed)
Procedure Name: LMA Insertion Performed by: Rosaland Lao, CRNA Pre-anesthesia Checklist: Patient identified, Emergency Drugs available, Suction available and Patient being monitored Patient Re-evaluated:Patient Re-evaluated prior to induction Oxygen Delivery Method: Circle system utilized Preoxygenation: Pre-oxygenation with 100% oxygen Induction Type: IV induction LMA: LMA inserted LMA Size: 4.0 Number of attempts: 1 Tube secured with: Tape

## 2020-03-17 NOTE — Progress Notes (Signed)
[facility] Progress Note  Patient: [name]    WIO:[id number]  DOA: 03/03/2020     Date of Service: the patient was seen and examined on 03/17/2020  Brief hospital course: Past medical history of CAD, type II DM, HTN, CKD 3B, osteoarthritis, hydronephrosis SP stent placement, prostate cancer, paroxysmal a flutter presented as a direct admit from orthopedic office secondary to hip pain.  Found to have septic arthritis and Enterobacter cloacae bacteremia.  Treated aggressively with IV antibiotics. 10/6 insertion of bilateral ureteral stents 11/23 admission with left high hip pain 11/24 CT-guided drainage placement in left thigh abscess 11/29 incision and drainage of left hip with linear exchange for periprosthetic joint infection 11/30 placement of right IJ tunneled double-lumen CVC by IR. 12/6 CT scan shows persistent abscess with new large abscess on left thigh area. Currently plan is patient scheduled for bilateral stent exchange on 12/7, follow-up on IR and orthopedic recommendation regarding drainage of the abscess.  Assessment and Plan: 1.  Left hip septic arthritis. Periprosthetic joint infection. Emphysematous pyelitis. Enterobacter cloacae bacteremia. Presented with left hip pain. CT have left hip shows large fluid and air collection around proximal femur along with concern for osteomyelitis and septic arthritis. SP CT-guided drainage, cultures were positive for Enterobacter cloacae. Blood cultures positive for Enterobacter cloacae as well. Urine culture also positive. Repeat blood culture so far no growth. Orthopedic was consulted. SP irrigation and debridement of the left hip with surface exchange. Deep cultures continues to grow Enterobacter cloacae. Appreciate ID consultation throughout the hospital course. SP TDC placement by IR. Plan for IV cefepime through April 21, 2019 followed by oral antibiotics.. Due to persistent pain, weakness and swelling of the  left thigh CT scan was performed on 03/16/2020.  Concerning for new abscess in left thigh area.  Discussed with radiologist who is concerned that this is more likely infection rather than hematoma. Discussed with orthopedic currently recommend conservative measures with IV antibiotics only. IR consulted. SP drain placement in left thigh on 12/7 finding old hematoma.  Sent for culture.  2.  Ileus Possible partial SBO Patient with significant abdominal distention. Required NG tube placement currently removed. General surgery was initially consulted and later on reconsider due to reoccurrence of the symptoms. Currently able to tolerate p.o. diet. Continue to maintain K more than 4, mag more than 2. Continue Colace Metamucil, simethicone.  3.  Paroxysmal atrial fibrillation as well as atrial flutter with RVR Patient follows up with Dr. [name]. Not on any anticoagulation prior to admission. Was on therapeutic anticoagulation with Lovenox. Rate significantly fluctuating. Dose adjusted for amiodarone and Lopressor. Briefly on Cardizem drip now back on Cardizem p.o. along with rest of the medication. Echocardiogram shows to 60% EF. Need to discuss with cardiology regarding the need for anticoagulation.  Although apparently cardiology has elected not to anticoagulate for A. fib in the past due to patient's anemia.  4.  Prostate cancer history. Left-sided hydronephrosis. Ureter stent placement bilateral Prostate cancer currently in remission. Appreciate urology assistance. Patient with stent exchange performed on 12/7  5.  Acute kidney injury on chronic renal disease stage IIIb. Anion gap metabolic acidosis. Hypokalemia Monitor renal function. Currently stable. Patient was given aggressive IV hydration.  6.  Anemia normocytic Possible blood loss anemia Blood loss with thigh hematoma Hemoglobin significantly low compared to last 48 hours. After 2 PRBC H&H increased  appropriately. Appears to have hematoma in his left thigh. Currently Lovenox on hold. Need to discuss with cardiology regarding further  anticoagulation need  7.  CAD On aspirin. Currently on hold.  8.  GERD Continue PPI.  9.  HLD Continue statin.  10.  Left leg edema. Doppler negative for DVT on 03/16/2020.  11.  Obesity Placing the patient at high risk for poor outcome. Although currently minimal oral intake secondary to acute illness.  Monitor. Body mass index is 34.84 kg/m.   Diet: Regular diet DVT Prophylaxis:   SCDs Start: 03/09/20 1851 Place TED hose Start: 03/09/20 1851 Place and maintain sequential compression device Start: 03/08/20 1851    Advance goals of care discussion: Full code  Family Communication: family was present at bedside, at the time of interview.  The pt provided permission to discuss medical plan with the family. Opportunity was given to ask question and all questions were answered satisfactorily.   Disposition:  Status is: Inpatient  Remains inpatient appropriate because:Ongoing diagnostic testing needed not appropriate for outpatient work up   Dispo: The patient is from: Home              Anticipated d/c is to: SNF              Anticipated d/c date is: > 3 days              Patient currently is not medically stable to d/c.  Subjective: No nausea no vomiting.  Reports constipation.  Passing gas.  Oral intake adequate as long as allowed diet.  Physical Exam:  General: Appear in mild distress, no Rash; Oral Mucosa Clear, moist. no Abnormal Neck Mass Or lumps, Conjunctiva normal  Cardiovascular: S1 and S2 Present, no Murmur, Respiratory: good respiratory effort, Bilateral Air entry present and CTA, no Crackles, no wheezes Abdomen: Bowel Sound present, Soft and distended, no tenderness Extremities: Left leg pedal edema Neurology: alert and oriented to time, place, and person affect appropriate. no new focal deficit Gait not checked due to  patient safety concerns  Vitals:   03/17/20 1715 03/17/20 1730 03/17/20 1745 03/17/20 2010  BP: (!) 163/75 (!) 142/67 (!) 169/75 138/69  Pulse: 86 (!) 47 91 95  Resp: 13 18 17 14   Temp:   98.5 F (36.9 C) 98.3 F (36.8 C)  TempSrc:    Oral  SpO2: 92% 93% 100% 96%  Weight:      Height:        Intake/Output Summary (Last 24 hours) at 03/17/2020 2117 Last data filed at 03/17/2020 2000 Gross per 24 hour  Intake 1140 ml  Output 1685 ml  Net -545 ml   Filed Weights   03/15/20 0500 03/16/20 0500 03/17/20 0500  Weight: 107.1 kg 106.9 kg 107 kg    Data Reviewed: I have personally reviewed and interpreted daily labs, tele strips, imagings as discussed above. I reviewed all nursing notes, pharmacy notes, vitals, pertinent old records I have discussed plan of care as described above with RN and patient/family.  CBC: Recent Labs  Lab 03/13/20 0408 03/16/20 0350 03/16/20 1025 03/16/20 2305 03/17/20 0541  WBC 10.5 9.2 7.7 7.9 6.5  NEUTROABS  --   --   --  6.4  --   HGB 8.8* 6.8* 6.8* 8.2* 8.0*  HCT 28.1* 22.1* 21.9* 25.7* 24.8*  MCV 96.2 97.4 96.1 93.5 93.6  PLT 388 313 311 303 793   Basic Metabolic Panel: Recent Labs  Lab 03/12/20 0450 03/14/20 0420 03/15/20 0415 03/16/20 0350 03/17/20 0541  NA 139 140 138 138 138  K 4.9 4.8 4.7 4.8 4.7  CL 107  109 110 111 110  CO2 25 22 20* 19* 21*  GLUCOSE 163* 138* 161* 139* 142*  BUN 47* 24* 21 23 22   CREATININE 1.49* 1.21 1.33* 1.41* 1.28*  CALCIUM 8.0* 7.7* 7.7* 7.4* 7.6*  MG 2.0 1.4* 1.6* 1.7 1.5*    Studies: IR US Guide Bx Asp/Drain  Result Date: 03/17/2020 INDICATION: CHRONIC LEFT THIGH HEMATOMA/ABSCESS EXAM: ULTRASOUND DRAINAGE LEFT ANTERIOR THIGH HEMATOMA/ABSCESS MEDICATIONS: The patient is currently admitted to the hospital and receiving intravenous antibiotics. The antibiotics were administered within an appropriate time frame prior to the initiation of the procedure. ANESTHESIA/SEDATION: Fentanyl 100 mcg IV; Versed  2.0 mg IV Moderate Sedation Time:  13 minutes The patient was continuously monitored during the procedure by the interventional radiology nurse under my direct supervision. COMPLICATIONS: None. PROCEDURE: Informed written consent was obtained from the patient after a thorough discussion of the procedural risks, benefits and alternatives. All questions were addressed. Maximal Sterile Barrier Technique was utilized including caps, mask, sterile gowns, sterile gloves, sterile drape, hand hygiene and skin antiseptic. A timeout was performed prior to the initiation of the procedure. Previous imaging reviewed. Preliminary ultrasound performed. A complex fluid collection was localized in the anterior proximal thigh. Overlying skin marked. Under sterile conditions and local anesthesia, an 18 gauge introducer needle was advanced from an anterior approach into the complex fluid collection. Needle position confirmed with ultrasound. Images obtained for documentation. Guidewire inserted followed by tract dilatation insert a 14 French drain. Drain catheter position confirmed with ultrasound. Syringe aspiration yielded 150 cc dark bloody fluid compatible with old hematoma. Sample sent for culture. Catheter secured with a Prolene suture and connected to external suction bulb. Sterile dressing applied no immediate complication. Patient tolerated procedure well. IMPRESSION: Ultrasound-guided left anterior thigh hematoma/abscess drain (14 [place]). Electronically Signed   By: Jerilynn Mages.  Shick M.D.   On: 03/17/2020 11:59   DG C-Arm 1-60 Min-No Report  Result Date: 03/17/2020 Fluoroscopy was utilized by the requesting physician.  No radiographic interpretation.    Scheduled Meds: . amiodarone  200 mg Oral Daily  . atorvastatin  40 mg Oral Daily  . Chlorhexidine Gluconate Cloth  6 each Topical Daily  . dextromethorphan  30 mg Oral BID  . diltiazem  120 mg Oral Daily  . fentaNYL      . ferrous sulfate  325 mg Oral TID WC  .  gabapentin  300 mg Oral TID  . insulin aspart  0-15 Units Subcutaneous TID WC  . insulin aspart  0-5 Units Subcutaneous QHS  . lidocaine      . lip balm  1 application Topical BID  . metoprolol tartrate  25 mg Oral BID  . midazolam      . pantoprazole  40 mg Oral Daily  . psyllium  1 packet Oral QODAY  . simethicone  80 mg Oral QID  . sodium chloride flush  10-40 mL Intracatheter Q12H  . sodium chloride flush  5 mL Intracatheter Q8H  . sodium chloride flush  5 mL Intracatheter Q8H   Continuous Infusions: . ceFEPime (MAXIPIME) IV 2 g (03/17/20 0347)  . ciprofloxacin     PRN Meds: acetaminophen, albuterol, alum & mag hydroxide-simeth, benzonatate, HYDROmorphone (DILAUDID) injection, menthol-cetylpyridinium, methocarbamol, metoprolol tartrate, ondansetron **OR** ondansetron (ZOFRAN) IV, oxyCODONE-acetaminophen, phenol, sodium chloride flush  Time spent: 35 minutes  Author: Berle Mull, MD [facility] 03/17/2020 9:17 PM  To reach On-call, see care teams to locate the attending and reach out via [email or web address]. Between 7PM-7AM, please contact night-coverage If  you still have difficulty reaching the attending provider, please page the [facility] (Director on Call) for [facility] on amion for assistance.

## 2020-03-17 NOTE — Progress Notes (Signed)
[facility]   Reason for visit: Follow up on prosthetic hip infection  Interval History: left thigh drained by IR, mainly appears to be hematoma.  Culture sent.   Day 13 cefepime    Physical Exam: Constitutional:  Vitals:   03/17/20 1105 03/17/20 1151  BP: 122/68 130/71  Pulse: 72 71  Resp: 12 14  Temp:  98.3 F (36.8 C)  SpO2: 99% 96%   patient appears in NAD Respiratory: Normal respiratory effort; CTA B Cardiovascular: RRR MS: left leg with catheter, draining to JP, bloody fluid  Review of Systems: Constitutional: negative for fevers and chills Gastrointestinal: negative for nausea and diarrhea  Lab Results  Component Value Date   WBC 6.5 03/17/2020   HGB 8.0 (L) 03/17/2020   HCT 24.8 (L) 03/17/2020   MCV 93.6 03/17/2020   PLT 271 03/17/2020    Lab Results  Component Value Date   CREATININE 1.28 (H) 03/17/2020   BUN 22 03/17/2020   NA 138 03/17/2020   K 4.7 03/17/2020   CL 110 03/17/2020   CO2 21 (L) 03/17/2020    Lab Results  Component Value Date   ALT 49 (H) 03/12/2020   AST 35 03/12/2020   ALKPHOS 83 03/12/2020     Microbiology: Recent Results (from the past 240 hour(s))  Culture, blood (Routine X 2) w Reflex to ID Panel     Status: None   Collection Time: 03/09/20 12:50 PM   Specimen: BLOOD  Result Value Ref Range Status   Specimen Description   Final    BLOOD RIGHT WRIST Performed at [facility], [place] [address], [place], [place] [address]    Special Requests   Final    BOTTLES DRAWN AEROBIC ONLY Blood Culture adequate volume Performed at [facility] [address], [place], [place] [address]    Culture   Final    NO GROWTH 5 DAYS Performed at [facility], [place] [address], [place], [place] [address]    Report Status 03/14/2020 FINAL  Final  Culture, blood (Routine X 2) w Reflex to ID Panel     Status: None   Collection Time: 03/09/20 12:50 PM   Specimen: BLOOD  RIGHT HAND  Result Value Ref Range Status   Specimen Description   Final    BLOOD RIGHT HAND Performed at [facility] [address], [place], [place] [address]    Special Requests   Final    BOTTLES DRAWN AEROBIC AND ANAEROBIC Blood Culture adequate volume Performed at [facility] [address], [place], [place] [address]    Culture   Final    NO GROWTH 5 DAYS Performed at [facility], [place] [address], [place], [place] [address]    Report Status 03/14/2020 FINAL  Final  Aerobic/Anaerobic Culture (surgical/deep wound)     Status: None   Collection Time: 03/09/20  3:53 PM   Specimen: PATH Other; Tissue  Result Value Ref Range Status   Specimen Description   Final    ABSCESS RIGHT HIP Performed at [facility] [address] [place],  [address]    Special Requests   Final    NONE Performed at [facility], [place] [address] [place], [place] [address]    Gram Stain   Final    FEW WBC PRESENT,BOTH PMN AND MONONUCLEAR FEW GRAM NEGATIVE RODS RARE GRAM POSITIVE COCCI    Culture  Final    FEW ENTEROBACTER CLOACAE NO ANAEROBES ISOLATED Performed at [facility], [place] [address] [place], [place] [address]    Report Status 03/14/2020 FINAL  Final   Organism ID, Bacteria ENTEROBACTER CLOACAE  Final      Susceptibility   Enterobacter cloacae - MIC*    CEFAZOLIN >=64 RESISTANT Resistant     CEFEPIME <=0.12 SENSITIVE Sensitive     CEFTAZIDIME <=1 SENSITIVE Sensitive     CIPROFLOXACIN <=0.25 SENSITIVE Sensitive     GENTAMICIN <=1 SENSITIVE Sensitive     IMIPENEM 0.5 SENSITIVE Sensitive     TRIMETH/SULFA <=20 SENSITIVE Sensitive     PIP/TAZO <=4 SENSITIVE Sensitive     * FEW ENTEROBACTER CLOACAE    Impression/Plan:  1. New left leg swelling with hematoma - mainly blood cw hematoma.  Getting drained and patient feels better.    2.  Hip infection - no change in plan to continue  antibiotics through January 10th.    I will sign off, call with questions.

## 2020-03-17 NOTE — Op Note (Addendum)
Preoperative diagnosis: Bilateral retained ureteral stent, left ureteral stone Postoperative diagnosis: Same  Procedure: Cystoscopy with right retrograde pyelogram, right diagnostic ureteroscopy, left ureteroscopy laser lithotripsy, right stent removal, left ureteral stent exchange  Surgeon: Junious Silk  Anesthesia: General  Indication for procedure: [name] is a 74 year old male he underwent urgent bilateral stents in October or about 2 months ago for a left mid to distal ureteral stone and acute renal failure.  Bilateral stents were placed with consideration of going back and inspecting the collecting systems at follow-up ureteroscopy.  In the meantime the patient became bacteremic and had a septic left hip which has been I&D as well as a PERC drain placement.  The patient's urine, blood and hip wound all grew Enterobacter.  While he is here on antibiotics plan was to take him for definitive stone removal to get the stents removed.  Findings: On cystoscopy the prostate is short and nonobstructive.  Bladder neck is a bit stiff given his prior radiation.  There were no stones in the bladder.  The stents were encrusted with a few millimeters of encrustation along the entire length of the ureteral and proximal portions to the stents.  Right retrograde pyelogram-this outlined a single ureter single collecting system unit.  There was some areas of narrowing and then slight dilation in the narrowing again especially in the mid to distal ureter.  There was mild dilation of the renal pelvis and proximal ureter but no significant dilation of the collecting system.  On right ureteroscopy the areas of narrowing and dilation ended up being consistent with some inflammation and edema in these areas.  There were no stones or tumors in the ureter.  The right ureter drained rather briskly although there was some contrast remaining in the collecting system and proximal ureter.  I did not replace the stent on the  right side.  On left ureteroscopy the stone had moved up a few centimeters into the mid ureter was also embedded anteriorly.  The turn up into the mid ureter in the anterior location made it impossible to reach with the semirigid.  I ended up having to place an access sheath and used the flexible scope.  The [employer] did an amazing job of completely dusting the stone.  A quick look all the way up to the left renal pelvis noted there to be no other stone or stone fragment proximally.  On final ureteroscopic view on the way out there were no ureteral injury or stone fragments.  Description of procedure: After consent was obtained patient brought to the operating room.  He was placed lithotomy position and prepped and draped in the usual sterile fashion.  Timeout was performed to confirm the patient and procedure.  Cystoscope was passed per urethra and the right ureteral stent grasped and removed through the urethral meatus.  There was some crust in the lumen and I could not pass the wire.  I passed the cystoscope back beside the stent into the bladder to pass the wire along the stent but the stent had already pulled out into the bladder.  The stent was then removed.  I used a 6 [place] open-ended catheter to cannulate the right ureteral orifice and retrograde injection of contrast was performed.  Attention was then turned to the left side where I passed a sensor wire adjacent to the left stent and backed the scope out.  I then went adjacent to the wire and grasped the left ureteral stent and removed it intact.  I then passed a semirigid ureteroscope into the left ureter and could not quite get up to the stone.  It was a steep turn out of the pelvis and the stone was anterior and I did not think I would be able to get close.  I passed a second sensor wire under direct vision and backed the semirigid out.  A ureteral access sheath was then advanced which went without difficulty.  The dual-lumen  digital ureteroscope was then advanced and felt like the access sheath in the scope were proximal to the stone which was the case.  The scope and the access sheath were carefully backed out until the stone was again visualized.  A 200 m laser fiber was then advanced and the stone was completely dusted.  I then passed the scope up the ureter and into the renal pelvis for a quick look and noted there to be no other stone fragments or any injury.  The access sheath was backed out on the ureteroscope and the ureter carefully inspected on the way out noted to be normal.   We turned our attention back to the right side and this side had drained but given the encrustation of the stent and some of the narrowed areas I took a quick look with the semirigid up the right side.  The right ureter had some narrowed areas but they were widely patent and no significant stone fragments were noted.  To ensure drainage I injected retrograde contrast through the semirigid scope to reoutline the ureter and then backed the scope out.  Again right ureter noted to be intact and ureter briskly drained.   The left wire was then backloaded on the cystoscope and a 6 x 26 cm stent advanced up the left side. The wire was removed with good coil seen in the kidney and a good coil in the bladder.  I left a string on the stent.  A 16 French Foley catheter was placed in left to gravity drainage.  I taped the stent string to the Foley.  I will remove this in several days.  He was then awakened taken recovery room in stable condition.  Complications: None  Blood loss: Minimal  Specimens: cathed urine C&S to lab   Drains: 6 x 26 cm left ureteral stent and Foley catheter

## 2020-03-17 NOTE — Progress Notes (Signed)
OT Cancellation Note  Patient Details Name: [name] MRN: [id number] DOB: 11-21-1945   Cancelled Treatment:    Reason Eval/Treat Not Completed: Patient at procedure or test/ unavailable. OT will continue efforts toward POC.   Gloris Manchester OTR/L Supplemental OT, Department of rehab services [phone]  Gracy Ehly R H. 03/17/2020, 2:16 PM

## 2020-03-17 NOTE — Anesthesia Preprocedure Evaluation (Addendum)
Anesthesia Evaluation  Patient identified by MRN, date of birth, ID band Patient awake    Reviewed: Allergy & Precautions, H&P , NPO status , Patient's Chart, lab work & pertinent test results  Airway Mallampati: I  TM Distance: >3 FB Neck ROM: Full    Dental  (+) Edentulous Upper, Partial Lower   Pulmonary asthma , former smoker,     + decreased breath sounds      Cardiovascular hypertension, Pt. on medications and Pt. on home beta blockers + CAD and + Past MI   Rhythm:Regular Rate:Normal     Neuro/Psych negative psych ROS   GI/Hepatic Neg liver ROS, GERD  Medicated,  Endo/Other  diabetes, Type 2, Oral Hypoglycemic Agents  Renal/GU CRFRenal disease     Musculoskeletal  (+) Arthritis , Osteoarthritis,    Abdominal (+) + obese,   Peds  Hematology  (+) Blood dyscrasia, anemia ,   Anesthesia Other Findings   Reproductive/Obstetrics                             Anesthesia Physical  Anesthesia Plan  ASA: III  Anesthesia Plan: General   Post-op Pain Management:    Induction: Intravenous, Rapid sequence and Cricoid pressure planned  PONV Risk Score and Plan: 3 and Ondansetron and Dexamethasone  Airway Management Planned: Oral ETT and LMA  Additional Equipment: None  Intra-op Plan:   Post-operative Plan: Extubation in OR  Informed Consent: I have reviewed the patients History and Physical, chart, labs and discussed the procedure including the risks, benefits and alternatives for the proposed anesthesia with the patient or authorized representative who has indicated his/her understanding and acceptance.     Dental advisory given  Plan Discussed with: CRNA and Anesthesiologist  Anesthesia Plan Comments:         Anesthesia Quick Evaluation

## 2020-03-17 NOTE — Progress Notes (Signed)
PT Cancellation Note  Patient Details Name: [name] MRN: [id number] DOB: [birth year]   Cancelled Treatment:     Cancel/Medical. Patient not seen today due to procedure performed to alleviate hematoma in LE. Hgb still low considering pt just received 2 units of blood yesterday. Plan to see patient as schedule permits.   Juel Burrow, [facility] [phone]

## 2020-03-17 NOTE — Procedures (Signed)
Interventional Radiology Procedure Note  Procedure: [place] drain left thigh abscess/hematoma (12 fr)     Complications: None  Estimated Blood Loss:  min  Findings: 150cc dark blood aspirated.  C/w old hematoma. cx sent    Tamera Punt, MD

## 2020-03-17 NOTE — Transfer of Care (Signed)
Immediate Anesthesia Transfer of Care Note  Patient: [name]  Procedure(s) Performed: CYSTOSCOPY/URETEROSCOPY/HOLMIUM LASER/STENT LEFT PLACEMENT (Bilateral )  Patient Location: PACU  Anesthesia Type:General  Level of Consciousness: awake, alert  and oriented  Airway & Oxygen Therapy: Patient Spontanous Breathing and Patient connected to face mask  Post-op Assessment: Report given to RN and Post -op Vital signs reviewed and stable  Post vital signs: Reviewed and stable  Last Vitals:  Vitals Value Taken Time  BP 158/71 03/17/20 1701  Temp    Pulse 40 03/17/20 1704  Resp 10 03/17/20 1704  SpO2 100 % 03/17/20 1704  Vitals shown include unvalidated device data.  Last Pain:  Vitals:   03/17/20 1500  TempSrc: Oral  PainSc:       Patients Stated Pain Goal: 0 (06/98/61 4830)  Complications: No complications documented.

## 2020-03-17 NOTE — H&P (Signed)
Chief Complaint: Left hip abscess. Request is for left hip drain - aspiration.   Referring Physician(s): Dr. Posey Pronto   Supervising Physician: Daryll Brod  Patient Status: [name] - In-pt  History of Present Illness: [name] is a 74 y.o. male History of CAD, DM, HTN, CKD, prostate cancer renal stones with hydronephrosis s/p ureteral stents  OA s/p left hip arthroplasty in 2001. Admitted from the orthopedic care to the ED at [name] on 11.23.21 with left hip pain, chills, nausea and diarrhea found to have septic arthritis with a  fluid and air collection with the left adductor muscle s/p percutaneous abscess drain placed by IR on 11.24.21. the patient was taken to surgery on 11.29.21 for an I&D of the left hip with liner exchange. The IR drain was removed at that time.  Due to ongoing pain and swelling of the left thigh  the Team ordered a CT left hip from 12.6.21. CT  reads Large complex fluid and air containing collection surrounding the proximal femoral component measuring up to 13 cm, compatible with infection/abscess.Team is requesing an abscess drain placement aspiration of  the left thigh abscess.  Patient alert sitting on the edge of his bed, calm and comfortable. Denies any fevers, headache, chest pain, SOB, cough, abdominal pain, nausea, vomiting or bleeding. Patient endorses left thigh pain that is worse with palpation.     Past Medical History:  Diagnosis Date  . Arthritis   . Asthma    no inhaler  . Coronary artery disease    cardiologist-  dr spruill; last visit 3 mos ago per pt  . GERD (gastroesophageal reflux disease)   . History of MI (myocardial infarction)    1985  . Hydronephrosis, left   . Hypertension   . Myocardial infarction ([place])   . Nephrolithiasis    left  . 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 Presence of tooth-root and mandibular implants    lower dental implants  . Prostate cancer ([place])   . Shortness of breath    WITH  EXERTION  . Type 2 diabetes mellitus ([place])     Past Surgical History:  Procedure Laterality Date  . BUNIONECTOMY    . CYSTOSCOPY W/ RETROGRADES Left 03/14/2014   Procedure: CYSTOSCOPY WITH LEFT RETROGRADE PYELOGRAM, Left Ureteroscopy, [place] ureteral Stent No string;  Surgeon: [name], MD;  Location: [facility];  Service: Urology;  Laterality: Left;  . CYSTOSCOPY W/ URETERAL STENT PLACEMENT Bilateral 01/15/2020   Procedure: CYSTOSCOPY WITH RETROGRADE PYELOGRAM/URETERAL STENT PLACEMENT;  Surgeon: Alexis Frock, MD;  Location: [facility];  Service: Urology;  Laterality: Bilateral;  . HERNIA REPAIR    . INCISION AND DRAINAGE Left 03/09/2020   Procedure: INCISION AND DRAINAGE LEFT HIP WITH LINER EXCHANGE;  Surgeon: Gaynelle Arabian, MD;  Location: [facility];  Service: Orthopedics;  Laterality: Left;  . IR FLUORO GUIDE CV LINE RIGHT  03/10/2020  . IR US GUIDE VASC ACCESS RIGHT  03/10/2020  . PROSTATE BIOPSY    . TOTAL HIP ARTHROPLASTY Left 03-20-2009  . TOTAL HIP ARTHROPLASTY  04/09/2012   Procedure: TOTAL HIP ARTHROPLASTY;  Surgeon: [name], MD;  Location: [facility];  Service: Orthopedics;  Laterality: Right;  . TOTAL KNEE ARTHROPLASTY Left 05-16-2008    Allergies: Shellfish allergy  Medications: Prior to Admission medications   Medication Sig Start Date End Date Taking? Authorizing Provider  albuterol (VENTOLIN HFA) 108 (90 Base) MCG/ACT inhaler Inhale 2 puffs into the lungs  every 6 (six) hours as needed for wheezing or shortness of breath. 08/09/19 03/03/20 Yes Luetta Nutting, DO  amLODipine (NORVASC) 10 MG tablet Take 10 mg by mouth daily.   Yes [provider]  Aspirin 81 MG CAPS Take 81 mg by mouth daily.    Yes [provider]  atorvastatin (LIPITOR) 40 MG tablet Take 1 tablet (40 mg total) by mouth daily. 06/26/16  Yes Tat, Shanon Brow, MD  carvedilol (COREG) 25 MG tablet Take 25 mg by mouth 2 (two) times daily with a meal.   Yes [provider]   co-enzyme Q-10 30 MG capsule Take 30 mg by mouth daily.   Yes [provider]  cycloSPORINE (RESTASIS) 0.05 % ophthalmic emulsion Place 1 drop into both eyes 2 (two) times daily.  09/23/19  Yes [provider]  diclofenac Sodium (VOLTAREN) 1 % GEL Apply 4 g topically 4 (four) times daily. 03/01/20  Yes Deno Etienne, DO  docusate sodium (COLACE) 100 MG capsule Take 100 mg by mouth daily as needed for mild constipation.   Yes [provider]  ferrous sulfate 325 (65 FE) MG tablet Take 325 mg by mouth daily with breakfast.   Yes [provider]  gabapentin (NEURONTIN) 300 MG capsule Take 1 capsule (300 mg total) by mouth 3 (three) times daily. Start 300mg  at bedtime x1 week then may increase to three times per day. Patient taking differently: Take 300 mg by mouth in the morning, at noon, and at bedtime.  10/21/19  Yes Luetta Nutting, DO  glimepiride (AMARYL) 4 MG tablet Take 4 mg by mouth daily before breakfast.   Yes [provider]  hydrochlorothiazide (HYDRODIURIL) 25 MG tablet Take 25 mg by mouth daily. 02/28/20  Yes [provider]  morphine (MSIR) 15 MG tablet Take 0.5 tablets (7.5 mg total) by mouth every 4 (four) hours as needed for severe pain. 03/01/20  Yes Deno Etienne, DO  predniSONE (DELTASONE) 50 MG tablet Take daily until resolution of gout symptoms. Patient taking differently: Take 50 mg by mouth daily. Take daily until resolution of gout symptoms. 02/21/20  Yes Luetta Nutting, DO  sitaGLIPtin (JANUVIA) 50 MG tablet Take 1 tablet (50 mg total) by mouth daily. 06/26/16  Yes Tat, Shanon Brow, MD  spironolactone (ALDACTONE) 25 MG tablet Take 25 mg by mouth daily. 02/28/20  Yes [provider]  Continuous Blood Gluc Receiver (FREESTYLE LIBRE 14 DAY READER) [place] See admin instructions. 05/23/19   [provider]  Continuous Blood Gluc Sensor (FREESTYLE LIBRE 14 DAY SENSOR) MISC 1 Device by Does not apply route every 14 (fourteen)  days. 05/23/19   Luetta Nutting, DO  omeprazole (PRILOSEC) 40 MG capsule Take 1 capsule (40 mg total) by mouth 2 (two) times daily. X 1 month, then once daily Patient not taking: Reported on 03/03/2020 06/26/16   Orson Eva, MD     Family History  Problem Relation Age of Onset  . Cancer Mother        breast  . Multiple sclerosis Daughter   . Cancer Father        prostate  . Cancer Maternal Uncle        bone    Social History   Socioeconomic History  . Marital status: Widowed    Spouse name: Not on file  . Number of children: Not on file  . Years of education: Not on file  . Highest education level: Not on file  Occupational History  . Not on file  Tobacco Use  . Smoking status: Former Smoker    Packs/day: 1.00    Years: 25.00    Pack years: 25.00    Types: Cigarettes    Quit date: 04/11/1984    Years since quitting: 35.9  . Smokeless tobacco: Former Systems developer    Quit date: 04/02/1985  Vaping Use  . Vaping Use: Never used  Substance and Sexual Activity  . Alcohol use: Yes    Comment: RARE  . Drug use: No  . Sexual activity: Not Currently  Other Topics Concern  . Not on file  Social History Narrative  . Not on file   Social Determinants of Health   Financial Resource Strain:   . Difficulty of Paying Living Expenses: Not on file  Food Insecurity: No Food Insecurity  . Worried About Charity fundraiser in the Last Year: Never true  . Ran Out of Food in the Last Year: Never true  Transportation Needs: No Transportation Needs  . Lack of Transportation (Medical): No  . Lack of Transportation (Non-Medical): No  Physical Activity:   . Days of Exercise per Week: Not on file  . Minutes of Exercise per Session: Not on file  Stress:   . Feeling of Stress : Not on file  Social Connections:   . Frequency of Communication with Friends and Family: Not on file  . Frequency of Social Gatherings with Friends and Family: Not on file  . Attends Religious Services: Not on file  .  Active Member of Clubs or Organizations: Not on file  . Attends Archivist Meetings: Not on file  . Marital Status: Not on file     Review of Systems: A 12 point ROS discussed and pertinent positives are indicated in the HPI above.  All other systems are negative.  Review of Systems  Constitutional: Negative for fever.  HENT: Negative for congestion.   Respiratory: Negative for cough and shortness of breath.   Cardiovascular: Negative for chest pain.  Gastrointestinal: Negative for abdominal pain.  Musculoskeletal: Positive for myalgias (left thigh worse with palpation. ).  Neurological: Negative for headaches.  Psychiatric/Behavioral: Negative for behavioral problems and confusion.    Vital Signs: BP (!) 150/62 (BP Location: Right Arm)   Pulse 74   Temp 98.8 F (37.1 C) (Oral)   Resp 14   Ht 5\' 9"  (1.753 m)   Wt 235 lb 14.3 oz (107 kg) Comment: bedscale  SpO2 98%   BMI 34.84 kg/m   Physical Exam Vitals and nursing note reviewed.  Constitutional:      Appearance: He is well-developed.  HENT:     Head: Normocephalic.  Cardiovascular:     Rate and Rhythm: Normal rate and regular rhythm.     Heart sounds: Normal heart sounds.  Pulmonary:     Effort: Pulmonary effort is normal.     Breath sounds: Normal breath sounds.  Musculoskeletal:        General: Normal range of motion.     Cervical back: Normal range of motion.  Skin:    General: Skin is dry.  Neurological:     Mental Status: He is alert and oriented to person, place, and time.     Imaging: CT ABDOMEN PELVIS WO CONTRAST  Result Date: 03/04/2020 CLINICAL DATA:  Nausea vomiting. EXAM: CT ABDOMEN AND PELVIS WITHOUT CONTRAST TECHNIQUE: Multidetector CT imaging of the abdomen and pelvis was performed following the standard protocol without IV contrast. COMPARISON:  CT abdomen pelvis 01/14/2020 FINDINGS: Lines and  tubes: Enteric tube noted with tip within the gastric lumen and side port at the  gastroesophageal junction. Bilateral ureteral stents with proximal pigtails in a superior pole calyx and distal pigtails not visualized due to streak artifact originating from bilateral femoral surgical hardware. Distal pigtails of the ureteral stents terminate in the expected region of the urinary bladder. Lower chest: Coronary artery calcifications. Trace pericardial effusion. No acute abnormality. Hepatobiliary: No focal liver abnormality. No gallstones, gallbladder wall thickening, or pericholecystic fluid. No biliary dilatation. Pancreas: No focal lesion. Normal pancreatic contour. No surrounding inflammatory changes. No main pancreatic ductal dilatation. Spleen: Normal in size. There is a 1.6 cm fluid density lesion within the spleen likely represents a simple cyst. Otherwise no focal abnormality. Adrenals/Urinary Tract: No adrenal nodule bilaterally. Couple of tiny foci of gas noted within the collecting systems bilaterally. No nephrolithiasis, no hydronephrosis, and no contour-deforming renal mass. No ureterolithiasis or hydroureter. The urinary bladder is unremarkable. Stomach/Bowel: Stomach is within normal limits. The proximal mid small bowel is mildly distended with fluid of to 3.2 cm in caliber. No associated pneumatosis. There is suggestion of a possible transition point within the anterior abdomen at the level of the umbilicus. The distal small bowel is collapsed. No evidence of large bowel wall thickening or dilatation. Appendix appears normal. Vascular/Lymphatic: No abdominal aorta or iliac aneurysm. Severe atherosclerotic plaque of the aorta and its branches. No abdominal, pelvic, or inguinal lymphadenopathy. Reproductive: Not visualized due to streak artifact originating from bilateral its femoral surgical hardware. Other: No intraperitoneal free fluid. No intraperitoneal free gas. No organized fluid collection. Musculoskeletal: Tiny Richter hernia containing the anti mesenteric wall of a very  short segment of small bowel (where the possible transition point is identified). No suspicious lytic or blastic osseous lesions. No acute displaced fracture. Multilevel degenerative changes of the spine. Partially visualized bilateral total hip arthroplasties. IMPRESSION: 1. Tiny Richter hernia containing a very short loop of small bowel with an associated early/partial small bowel obstruction. 2. Couple of tiny foci of gas noted within the collecting systems bilaterally. Findings could be related to recent ureteral stent placement versus could represent emphysematous pyelitis. Recommend correlation with urinalysis for urinary tract infection. 3. Enteric tube with tip within the gastric lumen and side port at the gastroesophageal junction. Consider advancing by 3-5cm. 4.  Aortic Atherosclerosis (ICD10-I70.0). Electronically Signed   By: Iven Finn M.D.   On: 03/04/2020 21:09   DG Abd 1 View  Result Date: 03/13/2020 CLINICAL DATA:  Ileus. EXAM: ABDOMEN - 1 VIEW COMPARISON:  03/11/2020.  CT 03/04/2020. FINDINGS: Bilateral ureteral stents again noted in stable position. Persistent small-bowel distention without interim change. Colon is nondistended. No free air. Degenerative change lumbar spine. Surgical clips noted the pelvis. Bilateral hip replacements. IMPRESSION: 1.  Bilateral ureteral stents again noted in stable position. 2. Persistent small-bowel distention consistent with small bowel obstruction. No interim change. Colon is nondistended. No free air. Electronically Signed   By: Marcello Moores  Register   On: 03/13/2020 06:51   DG Abd 1 View  Result Date: 03/11/2020 CLINICAL DATA:  Postoperative abdominal distension. EXAM: ABDOMEN - 1 VIEW COMPARISON:  March 10, 2020. FINDINGS: Stable position bilateral ureteral stents. No abnormal calcifications are noted. Slightly improved small bowel dilatation is noted consistent with ileus or possibly small bowel obstruction. No colonic dilatation is noted.  IMPRESSION: Slightly improved small bowel dilatation is noted consistent with ileus or possibly small bowel obstruction. Stable position bilateral ureteral stents. Electronically Signed   By: Jeneen Rinks  Murlean Caller M.D.   On: 03/11/2020 08:31   DG Abd 1 View  Result Date: 03/10/2020 CLINICAL DATA:  Abdominal distension EXAM: ABDOMEN - 1 VIEW COMPARISON:  03/06/2020 FINDINGS: Two supine frontal views of the abdomen and pelvis are obtained. Enteric catheter is seen previously has been removed in the interim. There is diffuse gaseous distention of the small bowel, with increase in the size and number of distended loops since prior study. Minimal colonic gas is visualized. No masses or abnormal calcifications. Bilateral ureteral stents are noted. IMPRESSION: 1. Progressive gaseous distention of the small bowel, consistent with worsening obstruction or ileus. Electronically Signed   By: Randa Ngo M.D.   On: 03/10/2020 15:07   DG Abd 1 View  Result Date: 03/04/2020 CLINICAL DATA:  NG tube placement EXAM: ABDOMEN - 1 VIEW COMPARISON:  03/04/2020 FINDINGS: Esophageal tube tip projects over the mid gastric region. Dilated loops of small bowel suspicious for obstruction. Partially visualized ureteral stents IMPRESSION: Esophageal tube tip overlies the mid stomach. Electronically Signed   By: Donavan Foil M.D.   On: 03/04/2020 22:46   DG Abd 1 View  Result Date: 03/04/2020 CLINICAL DATA:  NG tube placement EXAM: ABDOMEN - 1 VIEW COMPARISON:  03/03/2020 FINDINGS: An enteric tube is present with tip in the left upper quadrant consistent with location in the upper stomach although the proximal side hole projects over the distal esophagus. Mild gaseous distention of the stomach. Gaseous distention of small bowel is demonstrated in the upper abdomen. IMPRESSION: Enteric tube tip projects over the upper stomach with proximal side hole over the distal esophagus. Electronically Signed   By: Lucienne Capers M.D.   On:  03/04/2020 02:02   DG Abd 1 View  Result Date: 03/03/2020 CLINICAL DATA:  Abdominal distension EXAM: ABDOMEN - 1 VIEW COMPARISON:  02/24/2020 FINDINGS: Diffusely gas distended bowel, including a loop of dilated small bowel in the left mid abdomen. Bilateral nephroureteral stents. IMPRESSION: Diffusely gas distended bowel, including a loop of dilated small bowel in the left mid abdomen. Electronically Signed   By: Ulyses Jarred M.D.   On: 03/03/2020 23:21   CT HIP LEFT WO CONTRAST  Result Date: 03/16/2020 CLINICAL DATA:  Clinical concern for septic arthritis EXAM: CT OF THE LEFT HIP WITHOUT CONTRAST TECHNIQUE: Multidetector CT imaging of the left hip was performed according to the standard protocol. Multiplanar CT image reconstructions were also generated. COMPARISON:  03/01/2020 FINDINGS: Bones/Joint/Cartilage Status post left total hip arthroplasty. Arthroplasty components are in their expected alignment. No evidence of acute periprosthetic fracture. Similar periprosthetic lucency along the posterior cortex of the proximal femoral stem measuring approximately 7.2 cm in length (series 8, image 59), similar to prior. Similar degree of periprosthetic lucency surrounding the anterolateral aspect of the proximal femur (series 3, image 89). No air is seen within bone, as was evident on the previous study. Large complex fluid and air containing collection surrounding the proximal femoral component measuring approximately 13 x 7.5 x 10 cm (series 4, image 85; series 9, image 76). There is air surrounding the femoral component at the level of the femoral neck. Again seen is extension of ill-defined fluid and air within the left adductor muscle compartment (series 4, image 95). New fluid and air containing collection is now seen within the anterior compartment musculature of the proximal to mid left thigh, which appears centered in the rectus femoris muscle. This collection measures approximately 14.0 x 3.1 x 8.3 cm  (series 10, image 65; series 4,  image 139). Ligaments Suboptimally assessed by CT. Muscles and Tendons Intramuscular fluid collection within the rectus femoris muscle, as above. Ill-defined adductor compartment collection, as above. Soft tissues Circumferential subcutaneous edema most pronounced at the anterolateral aspect of the proximal thigh. IMPRESSION: 1. Status post left total hip arthroplasty with similar degree of periprosthetic lucency surrounding the proximal femoral component concerning for septic loosening. 2. Large complex fluid and air containing collection surrounding the proximal femoral component measuring up to 13 cm, compatible with infection/abscess. 3. New large fluid and air containing collection within the anterior compartment musculature of the proximal to mid left thigh, which appears centered in the rectus femoris muscle. Collection measures up to 14 cm in length. Findings are compatible with an intramuscular abscess. Electronically Signed   By: Davina Poke D.O.   On: 03/16/2020 09:56   CT HIP LEFT WO CONTRAST  Result Date: 03/01/2020 CLINICAL DATA:  Left hip pain. EXAM: CT OF THE LEFT HIP WITHOUT CONTRAST TECHNIQUE: Multidetector CT imaging of the left hip was performed according to the standard protocol. Multiplanar CT image reconstructions were also generated. COMPARISON:  X-ray 03/01/2020 FINDINGS: Bones/Joint/Cartilage Status post left total hip arthroplasty. Arthroplasty components are in their expected alignment. No evidence of periprosthetic fracture. Large fluid and air containing collection surrounding the proximal femoral component measuring approximately 8.7 cm in AP dimension (series 4, image 61) by 6.7 cm in craniocaudal dimension (series 9, image 94). Largest fluid component is along the lateral aspect of the proximal femur. Air and fluid track medially within the adductor muscle compartment extending to the level of the left pubic tubercle (series 4, images 74-79).  Possible erosion along the anterior aspect of the proximal native femur with air seen within bone (series 3, image 68). Periprosthetic lucency along the posterior cortex of the proximal femoral stem extending distally by approximately 7.5 cm (series 8, image 59). Ligaments Suboptimally assessed by CT. Muscles and Tendons Poorly defined fluid and air collection within the left adductor muscular compartment. Soft tissues Remaining soft tissues within normal limits. IMPRESSION: 1. Status post left total hip arthroplasty. Large fluid and air containing collection surrounding the proximal femoral component measuring up to 8.7 cm, concerning for infection/abscess. Fluid and air collection track within the left adductor muscle compartment extending towards the symphysis. Orthopedic consultation and arthrocentesis recommended. 2. Periprosthetic lucency along the posterior cortex of the proximal femoral stem extending distally by approximately 7.5 cm, concerning for septic loosening. 3. Additional area of lucency along the anterior aspect of the proximal native femur with air seen within bone, concerning for osteomyelitis. Electronically Signed   By: Davina Poke D.O.   On: 03/01/2020 11:10   IR Fluoro Guide CV Line Right  Result Date: 03/10/2020 INDICATION: 74 year old male referred for tunneled central venous catheter EXAM: IMAGE GUIDED TUNNELED CENTRAL VENOUS CATHETER PLACEMENT MEDICATIONS: None. ANESTHESIA/SEDATION: None FLUOROSCOPY TIME:  Fluoroscopy Time: 0 minutes 6 seconds (3 mGy). COMPLICATIONS: None PROCEDURE: After written informed consent was obtained, patient was placed in the supine position on angiographic table. Patency of the right internal jugular vein was confirmed with ultrasound with image documentation. Patient was prepped and draped in the usual sterile fashion including the right neck and right superior chest. Using ultrasound guidance, the skin and subcutaneous tissues overlying the right  internal jugular vein were generously infiltrated with 1% lidocaine without epinephrine. Using ultrasound guidance, the right internal jugular vein was punctured with a micropuncture needle, and an 018 wire was advanced into the right heart confirming venous access.  A small stab incision was made with an 11 blade scalpel. Peel-away sheath was placed over the wire, and then the wire was removed, marking the wire for estimation of internal catheter length. The chest wall was then generously infiltrated with 1% lidocaine for local anesthesia along the tissue tract. Small stab incision was made with 11 blade scalpel, and then the catheter was back tunneled to the puncture site at the right internal jugular vein. Double-lumen catheter was pulled through the tract, with the catheter amputated at 22 cm. Catheter was advanced through the peel-away sheath, and the peel-away sheath was removed. Final image was stored. The catheter was anchored to the chest wall with 2 retention sutures, and Derma bond was used to seal the right internal jugular vein incision site and at the right chest wall. Patient tolerated the procedure well and remained hemodynamically stable throughout. No complications were encountered and no significant blood loss was encountered. FINDINGS: Tip at the cavoatrial junction IMPRESSION: Status post image guided placement of tunneled right IJ cuffed central venous catheter. Signed, Dulcy Fanny. Dellia Nims, RPVI [name] Electronically Signed   By: Corrie Mckusick D.O.   On: 03/10/2020 17:28   IR US Guide Vasc Access Right  Result Date: 03/10/2020 INDICATION: 74 year old male referred for tunneled central venous catheter EXAM: IMAGE GUIDED TUNNELED CENTRAL VENOUS CATHETER PLACEMENT MEDICATIONS: None. ANESTHESIA/SEDATION: None FLUOROSCOPY TIME:  Fluoroscopy Time: 0 minutes 6 seconds (3 mGy). COMPLICATIONS: None PROCEDURE: After written informed  consent was obtained, patient was placed in the supine position on angiographic table. Patency of the right internal jugular vein was confirmed with ultrasound with image documentation. Patient was prepped and draped in the usual sterile fashion including the right neck and right superior chest. Using ultrasound guidance, the skin and subcutaneous tissues overlying the right internal jugular vein were generously infiltrated with 1% lidocaine without epinephrine. Using ultrasound guidance, the right internal jugular vein was punctured with a micropuncture needle, and an 018 wire was advanced into the right heart confirming venous access. A small stab incision was made with an 11 blade scalpel. Peel-away sheath was placed over the wire, and then the wire was removed, marking the wire for estimation of internal catheter length. The chest wall was then generously infiltrated with 1% lidocaine for local anesthesia along the tissue tract. Small stab incision was made with 11 blade scalpel, and then the catheter was back tunneled to the puncture site at the right internal jugular vein. Double-lumen catheter was pulled through the tract, with the catheter amputated at 22 cm. Catheter was advanced through the peel-away sheath, and the peel-away sheath was removed. Final image was stored. The catheter was anchored to the chest wall with 2 retention sutures, and Derma bond was used to seal the right internal jugular vein incision site and at the right chest wall. Patient tolerated the procedure well and remained hemodynamically stable throughout. No complications were encountered and no significant blood loss was encountered. FINDINGS: Tip at the cavoatrial junction IMPRESSION: Status post image guided placement of tunneled right IJ cuffed central venous catheter. Signed, Dulcy Fanny. Dellia Nims, RPVI [name] Radiology Electronically Signed   By: Corrie Mckusick D.O.   On:  03/10/2020 17:28   DG CHEST PORT 1 VIEW  Result Date: 03/08/2020 CLINICAL DATA:  Preoperative assessment. EXAM: PORTABLE CHEST 1 VIEW COMPARISON:  Chest radiograph dated 01/26/2018. FINDINGS: The heart is enlarged. Both lungs are clear. Degenerative changes are seen  in the spine. IMPRESSION: Cardiomegaly without acute cardiopulmonary process. Electronically Signed   By: Zerita Boers M.D.   On: 03/08/2020 17:34   DG Abd Portable 1V  Result Date: 03/06/2020 CLINICAL DATA:  Small-bowel obstruction. EXAM: PORTABLE ABDOMEN - 1 VIEW COMPARISON:  03/05/2020 abdominal radiographs and prior. FINDINGS: Indwelling enteric tube with tip and side hole overlying the gastric body. Increased conspicuity of gas-filled mildly dilated small bowel loops. Residual enteric contrast opacifies nondilated ascending colon. Bilateral double-J ureteral stents are unchanged. Sequela of bilateral hip arthroplasties. Partially imaged left hip surgical drain. IMPRESSION: Mildly dilated small bowel loops, more conspicuous than prior exam. Indwelling enteric tube and bilateral ureteral stents, unchanged. Electronically Signed   By: Primitivo Gauze M.D.   On: 03/06/2020 10:02   DG Abd Portable 1V-Small Bowel Obstruction Protocol-initial, 8 hr delay  Result Date: 03/05/2020 CLINICAL DATA:  SBO EXAM: PORTABLE ABDOMEN - 1 VIEW COMPARISON:  March 04, 2020 FINDINGS: Persistent gaseous dilation of loops of small bowel. Small bowel loop measures up to 5.2 cm, similar in comparison to prior. There is enteric contrast within a dilated loop of proximal small bowel. Enteric tube is coiled within the stomach with tip terminating over the distal stomach. Bilateral double-J stents. LEFT-sided surgical drain overlying the LEFT lateral leg. Soft tissue air of the LEFT lateral leg. Status post bilateral hip arthroplasty. Degenerative changes of the lower lumbar spine. IMPRESSION: Persistent gaseous dilation of loops of small bowel, similar in  comparison to prior. Enteric contrast is within the dilated proximal small bowel. Electronically Signed   By: Valentino Saxon MD   On: 03/05/2020 11:12   ECHOCARDIOGRAM COMPLETE  Result Date: 03/06/2020    ECHOCARDIOGRAM REPORT   Patient Name:   KONOR NOREN Date of Exam: 03/06/2020 Medical Rec #:  [id number]        Height:       74.0 in Accession #:    [id number]       Weight:       243.2 lb Date of Birth:  1945-05-16        BSA:          2.362 m Patient Age:    61 years         BP:           171/78 mmHg Patient Gender: M                HR:           86 bpm. Exam Location:  Inpatient Procedure: 2D Echo, Cardiac Doppler and Color Doppler Indications:    I48.91* Unspeicified atrial fibrillation  History:        Patient has prior history of Echocardiogram examinations, most                 recent 06/24/2016. Previous Myocardial Infarction and CAD,                 Abnormal ECG, Arrythmias:Atrial Fibrillation,                 Signs/Symptoms:Chest Pain; Risk Factors:Diabetes, Former Smoker,                 Hypertension and Dyslipidemia. Cancer.  Sonographer:    Roseanna Rainbow RDCS Referring Phys: [phone] A CALDWELL POWELL JR  Sonographer Comments: Suboptimal parasternal window and suboptimal subcostal window. IMPRESSIONS  1. Left ventricular ejection fraction, by estimation, is 55 to 60%. The left ventricle has normal function. The left ventricle has no  regional wall motion abnormalities. There is mild concentric left ventricular hypertrophy. Left ventricular diastolic function could not be evaluated.  2. Right ventricular systolic function is normal. The right ventricular size is normal.  3. Left atrial size was mildly dilated.  4. The pericardial effusion is circumferential.  5. The mitral valve is normal in structure. Mild mitral valve regurgitation. No evidence of mitral stenosis.  6. The aortic valve is normal in structure. Aortic valve regurgitation is not visualized. Mild aortic valve sclerosis is present,  with no evidence of aortic valve stenosis.  7. The inferior vena cava is normal in size with greater than 50% respiratory variability, suggesting right atrial pressure of 3 mmHg. Comparison(s): No significant change from prior study. Prior images reviewed side by side. FINDINGS  Left Ventricle: Left ventricular ejection fraction, by estimation, is 55 to 60%. The left ventricle has normal function. The left ventricle has no regional wall motion abnormalities. The left ventricular internal cavity size was normal in size. There is  mild concentric left ventricular hypertrophy. Left ventricular diastolic function could not be evaluated due to atrial fibrillation. Left ventricular diastolic function could not be evaluated. Right Ventricle: The right ventricular size is normal. No increase in right ventricular wall thickness. Right ventricular systolic function is normal. Left Atrium: Left atrial size was mildly dilated. Right Atrium: Right atrial size was normal in size. Pericardium: Trivial pericardial effusion is present. The pericardial effusion is circumferential. Mitral Valve: The mitral valve is normal in structure. Mild mitral annular calcification. Mild mitral valve regurgitation, with centrally-directed jet. No evidence of mitral valve stenosis. Tricuspid Valve: The tricuspid valve is normal in structure. Tricuspid valve regurgitation is not demonstrated. No evidence of tricuspid stenosis. Aortic Valve: The aortic valve is normal in structure. Aortic valve regurgitation is not visualized. Mild aortic valve sclerosis is present, with no evidence of aortic valve stenosis. Pulmonic Valve: The pulmonic valve was not well visualized. Pulmonic valve regurgitation is not visualized. No evidence of pulmonic stenosis. Aorta: The aortic root is normal in size and structure. Venous: The inferior vena cava is normal in size with greater than 50% respiratory variability, suggesting right atrial pressure of 3 mmHg.  IAS/Shunts: No atrial level shunt detected by color flow Doppler.  LEFT VENTRICLE PLAX 2D LVIDd:         4.00 cm      Diastology LVIDs:         2.80 cm      LV e' medial:    13.50 cm/s LV PW:         1.60 cm      LV E/e' medial:  6.0 LV IVS:        1.50 cm      LV e' lateral:   17.20 cm/s LVOT diam:     1.90 cm      LV E/e' lateral: 4.7 LV SV:         54 LV SV Index:   23 LVOT Area:     2.84 cm  LV Volumes (MOD) LV vol d, MOD A2C: 58.0 ml LV vol d, MOD A4C: 107.0 ml LV vol s, MOD A2C: 29.0 ml LV vol s, MOD A4C: 41.2 ml LV SV MOD A2C:     29.0 ml LV SV MOD A4C:     107.0 ml LV SV MOD BP:      47.0 ml RIGHT VENTRICLE         IVC TAPSE (M-mode): 2.7 cm  IVC diam: 1.70 cm  LEFT ATRIUM             Index       RIGHT ATRIUM           Index LA diam:        3.70 cm 1.57 cm/m  RA Area:     15.30 cm LA Vol (A2C):   39.4 ml 16.68 ml/m RA Volume:   37.00 ml  15.67 ml/m LA Vol (A4C):   58.0 ml 24.56 ml/m LA Biplane Vol: 49.4 ml 20.92 ml/m  AORTIC VALVE LVOT Vmax:   110.00 cm/s LVOT Vmean:  75.100 cm/s LVOT VTI:    0.192 m  AORTA Ao Root diam: 3.30 cm MITRAL VALVE MV Area (PHT): 4.60 cm    SHUNTS MV Decel Time: 165 msec    Systemic VTI:  0.19 m MV E velocity: 81.40 cm/s  Systemic Diam: 1.90 cm Dani Gobble Croitoru MD Electronically signed by Sanda Klein MD Signature Date/Time: 03/06/2020/2:21:39 PM    Final    [place] FNA SOFT TISSUE  Result Date: 03/04/2020 INDICATION: History of left total hip replacement, now with concern for infection. Please perform ultrasound-guided aspiration and/or drainage catheter placement for infection source control purposes. EXAM: ULTRASOUND-GUIDED DRAINAGE CATHETER PLACEMENT INTO DOMINANT FLUID COLLECTION INVOLVING THE PROXIMAL LATERAL ASPECT THE LEFT THIGH COMPARISON:  Left hip CT-03/01/2020 MEDICATIONS: The patient is currently admitted to the hospital and receiving intravenous antibiotics. The antibiotics were administered within an appropriate time frame prior to the initiation of the  procedure. ANESTHESIA/SEDATION: Moderate (conscious) sedation was employed during this procedure. A total of Versed 2 mg and Fentanyl 100 mcg was administered intravenously. Moderate Sedation Time: 18 minutes. The patient's level of consciousness and vital signs were monitored continuously by radiology nursing throughout the procedure under my direct supervision. CONTRAST:  None COMPLICATIONS: None immediate. PROCEDURE: Informed written consent was obtained from the patient after a discussion of the risks, benefits and alternatives to treatment. The patient was placed supine on his hospital bed with ultrasound imaging demonstrating a large (at least 7.6 x 1.6 cm complex fluid collection with the subcutaneous tissues involving the lateral aspect of the proximal left thigh (representative image 2). The procedure was planned. A timeout was performed prior to the initiation of the procedure. The skin overlying the lateral aspect the left thigh was prepped and draped in the usual sterile fashion. The overlying soft tissues were anesthetized with 1% lidocaine with epinephrine. Utilizing a caudal to cranial approach, an 18 gauge trocar needle was advanced through near the entirety of the abscess/fluid collection within the left lateral thigh and a short Amplatz super stiff wire was coiled within the cranial component of the collection. Appropriate positioning was confirmed ultrasound imaging. The tract was serially dilated allowing placement of a 12 [place] all-purpose drainage catheter. Appropriate positioning was confirmed with a limited postprocedural CT scan. Next, approximately 50 cc of purulent fluid was aspirated. The tube was connected to a JP bulb and sutured in place. A dressing was placed. The patient tolerated the procedure well without immediate post procedural complication. IMPRESSION: Successful CT guided placement of a 60 French all purpose drain catheter into the complex fluid collection involving the  proximal lateral aspect the left thigh with aspiration of 50 cc of purulent fluid. Samples were sent to the laboratory as requested by the ordering clinical team. Electronically Signed   By: Sandi Mariscal M.D.   On: 03/04/2020 15:43   DG Hip Unilat W or Wo Pelvis 2-3 Views Left  Result Date:  03/01/2020 CLINICAL DATA:  Left hip pain radiating down left leg to knee 1 week. EXAM: DG HIP (WITH OR WITHOUT PELVIS) 2-3V LEFT COMPARISON:  Abdominal films 12/05/2019 FINDINGS: Examination demonstrates bilateral hip arthroplasties intact and unchanged. Minimal air in the soft tissues lateral to the left hip. Known bilateral ureteral stents are present as inferior aspect of the stents come together in the midline of the pelvis extending into the region of the trigone. Moderate degenerate change of the spine. No acute fracture or dislocation. IMPRESSION: 1. Bilateral hip arthroplasties intact and unchanged. No acute fracture or dislocation. Suggestion of mild air over the soft tissues lateral to the left hip as recommend clinical correlation with recent intervention. Soft tissue infection could produce this appearance. 2. Known bilateral ureteral stents as described. Electronically Signed   By: Marin Olp M.D.   On: 03/01/2020 09:21   VAS [place] LOWER EXTREMITY VENOUS (DVT)  Result Date: 03/16/2020  Lower Venous DVT Study Indications: Edema.  Comparison Study: no prior Performing Technologist: Abram Sander RVS  Examination Guidelines: A complete evaluation includes B-mode imaging, spectral Doppler, color Doppler, and power Doppler as needed of all accessible portions of each vessel. Bilateral testing is considered an integral part of a complete examination. Limited examinations for reoccurring indications may be performed as noted. The reflux portion of the exam is performed with the patient in reverse Trendelenburg.  +-----+---------------+---------+-----------+----------+--------------+  RIGHTCompressibilityPhasicitySpontaneityPropertiesThrombus Aging +-----+---------------+---------+-----------+----------+--------------+ CFV  Full           Yes      Yes                                 +-----+---------------+---------+-----------+----------+--------------+   +---------+---------------+---------+-----------+----------+-------------------+ LEFT     CompressibilityPhasicitySpontaneityPropertiesThrombus Aging      +---------+---------------+---------+-----------+----------+-------------------+ CFV      Full           Yes      Yes                                      +---------+---------------+---------+-----------+----------+-------------------+ SFJ      Full                                                             +---------+---------------+---------+-----------+----------+-------------------+ FV Prox  Full                                                             +---------+---------------+---------+-----------+----------+-------------------+ FV Mid   Full                                                             +---------+---------------+---------+-----------+----------+-------------------+ FV DistalFull                                                             +---------+---------------+---------+-----------+----------+-------------------+  PFV      Full                                                             +---------+---------------+---------+-----------+----------+-------------------+ POP      Full           Yes      Yes                                      +---------+---------------+---------+-----------+----------+-------------------+ PTV      Full                                                             +---------+---------------+---------+-----------+----------+-------------------+ PERO                                                  Not well visualized  +---------+---------------+---------+-----------+----------+-------------------+     Summary: RIGHT: - No evidence of common femoral vein obstruction.  LEFT: - There is no evidence of deep vein thrombosis in the lower extremity.  - No cystic structure found in the popliteal fossa.  *See table(s) above for measurements and observations. Electronically signed by Ruta Hinds MD on 03/16/2020 at 5:02:47 PM.    Final     Labs:  CBC: Recent Labs    03/16/20 0350 03/16/20 1025 03/16/20 2305 03/17/20 0541  WBC 9.2 7.7 7.9 6.5  HGB 6.8* 6.8* 8.2* 8.0*  HCT 22.1* 21.9* 25.7* 24.8*  PLT 313 311 303 271    COAGS: Recent Labs    03/04/20 1252  INR 1.1    BMP: Recent Labs    10/24/19 0807 10/24/19 0807 12/06/19 0736 12/06/19 0736 01/06/20 0751 01/06/20 0751 01/13/20 0716 01/14/20 1027 03/14/20 0420 03/15/20 0415 03/16/20 0350 03/17/20 0541  NA 135   < > 137   < > 136   < > 136   < > 140 138 138 138  K 4.8   < > 5.4*   < > 5.3   < > 6.4*   < > 4.8 4.7 4.8 4.7  CL 109   < > 109   < > 108   < > 110   < > 109 110 111 110  CO2 18*   < > 21   < > 20   < > 19*   < > 22 20* 19* 21*  GLUCOSE 145*   < > 152*   < > 107*   < > 58*   < > 138* 161* 139* 142*  BUN 33*   < > 43*   < > 55*   < > 52*   < > 24* 21 23 22   CALCIUM 9.0   < > 9.0   < > 9.0   < > 8.9   < > 7.7* 7.7* 7.4* 7.6*  CREATININE 1.62*   < > 2.02*   < >  3.11*   < > 3.31*   < > 1.21 1.33* 1.41* 1.28*  GFRNONAA 41*   < > 32*   < > 19*   < > 17*   < > >60 56* 52* 59*  GFRAA 48*  --  37*  --  22*  --  20*  --   --   --   --   --    < > = values in this interval not displayed.    LIVER FUNCTION TESTS: Recent Labs    03/08/20 0610 03/09/20 0419 03/10/20 0633 03/12/20 0450  BILITOT 0.5 0.6 0.9 0.5  AST 22 44* 76* 35  ALT 17 33 67* 49*  ALKPHOS 101 94 97 83  PROT 7.3 7.1 6.9 6.0*  ALBUMIN 2.4* 2.3* 2.4* 2.3*   Assessment and Plan:  74 y.o. male inpatient. History of CAD, DM, HTN, CKD, prostate cancer renal stones  with hydronephrosis s/p ureteral stents  OA s/p left hip arthroplasty in 2001. Admitted from the orthopedic care to the ED at [facility] on 11.23.21 with left hip pain, chills, nausea and diarrhea found to have septic arthritis with a  fluid and air collection with the left adductor muscle s/p percutaneous abscess drain placed by IR on 11.24.21. the patient was taken to surgery on 11.29.21 for an I&D of the left hip with liner exchange. The IR drain was removed at that time.  Due to ongoing pain and swelling of the left thigh  the Team ordered a CT left hip from 12.6.21. CT  reads Large complex fluid and air containing collection surrounding the proximal femoral component measuring up to 13 cm, compatible with infection/abscess.Team is requesing an abscess drain placement aspiration of  the left thigh abscess.  Cr 1.28. cultures from abscess Emphysematous pyelitis. Enterobacter cloacae bacteremia. Patient is afebrile. Allergies include shellfish. Patient has been NPO since midnight.   Risks and benefits discussed with the patient including bleeding, infection, damage to adjacent structures, bowel perforation/fistula connection, and sepsis.  All of the patient's questions were answered, patient is agreeable to proceed. Consent signed and in chart.   Thank you for this interesting consult.  I greatly enjoyed meeting [name] and look forward to participating in their care.  A copy of this report was sent to the requesting provider on this date.  Electronically Signed: Jacqualine Mau, NP 03/17/2020, 9:15 AM   I spent a total of 40 Minutes    in face to face in clinical consultation, greater than 50% of which was counseling/coordinating care for left thigh aspiration - abscess drain placement.

## 2020-03-18 ENCOUNTER — Inpatient Hospital Stay: Payer: HMO

## 2020-03-18 DIAGNOSIS — I10 Essential (primary) hypertension: Secondary | ICD-10-CM

## 2020-03-18 DIAGNOSIS — Z23 Encounter for immunization: Secondary | ICD-10-CM

## 2020-03-18 DIAGNOSIS — Z96642 Presence of left artificial hip joint: Secondary | ICD-10-CM

## 2020-03-18 DIAGNOSIS — L02419 Cutaneous abscess of limb, unspecified: Secondary | ICD-10-CM

## 2020-03-18 LAB — BASIC METABOLIC PANEL
Anion gap: 10 (ref 5–15)
BUN: 21 mg/dL (ref 8–23)
CO2: 18 mmol/L — ABNORMAL LOW (ref 22–32)
Calcium: 8.1 mg/dL — ABNORMAL LOW (ref 8.9–10.3)
Chloride: 110 mmol/L (ref 98–111)
Creatinine, Ser: 1.27 mg/dL — ABNORMAL HIGH (ref 0.61–1.24)
GFR, Estimated: 59 mL/min — ABNORMAL LOW (ref >60)
Glucose, Bld: 287 mg/dL — ABNORMAL HIGH (ref 70–99)
Potassium: 5.4 mmol/L — ABNORMAL HIGH (ref 3.5–5.1)
Sodium: 138 mmol/L (ref 135–145)

## 2020-03-18 LAB — CBC
HCT: 28.6 % — ABNORMAL LOW (ref 39.0–52.0)
Hemoglobin: 9.2 g/dL — ABNORMAL LOW (ref 13.0–17.0)
MCH: 30.3 pg (ref 26.0–34.0)
MCHC: 32.2 g/dL (ref 30.0–36.0)
MCV: 94.1 fL (ref 80.0–100.0)
Platelets: 349 10*3/uL (ref 150–400)
RBC: 3.04 MIL/uL — ABNORMAL LOW (ref 4.22–5.81)
RDW: 15.5 % (ref 11.5–15.5)
WBC: 5.4 10*3/uL (ref 4.0–10.5)
nRBC: 0 % (ref 0.0–0.2)

## 2020-03-18 LAB — GLUCOSE, CAPILLARY
Glucose-Capillary: 223 mg/dL — ABNORMAL HIGH (ref 70–99)
Glucose-Capillary: 155 mg/dL — ABNORMAL HIGH (ref 70–99)
Glucose-Capillary: 178 mg/dL — ABNORMAL HIGH (ref 70–99)
Glucose-Capillary: 181 mg/dL — ABNORMAL HIGH (ref 70–99)

## 2020-03-18 LAB — MAGNESIUM: Magnesium: 1.9 mg/dL (ref 1.7–2.4)

## 2020-03-18 MED ORDER — SODIUM ZIRCONIUM CYCLOSILICATE 10 G PO PACK
10.0000 g | PACK | Freq: Once | ORAL | Status: AC
  Administered 2020-03-18: 10 g via ORAL
  Filled 2020-03-18: qty 1

## 2020-03-18 NOTE — Anesthesia Postprocedure Evaluation (Signed)
Anesthesia Post Note  Patient: [name]  Procedure(s) Performed: CYSTOSCOPY/URETEROSCOPY/HOLMIUM LASER/STENT LEFT PLACEMENT (Bilateral )     Patient location during evaluation: PACU Anesthesia Type: General Level of consciousness: awake and alert Pain management: pain level controlled Vital Signs Assessment: post-procedure vital signs reviewed and stable Respiratory status: spontaneous breathing, nonlabored ventilation, respiratory function stable and patient connected to nasal cannula oxygen Cardiovascular status: blood pressure returned to baseline and stable Postop Assessment: no apparent nausea or vomiting Anesthetic complications: no   No complications documented.  Last Vitals:  Vitals:   03/17/20 2010 03/18/20 0421  BP: 138/69 (!) 151/90  Pulse: 95 71  Resp: 14 14  Temp: 36.8 C 36.6 C  SpO2: 96%     Last Pain:  Vitals:   03/18/20 0910  TempSrc:   PainSc: 3    Pain Goal: Patients Stated Pain Goal: 3 (03/18/20 0810)                 Terrall Bley

## 2020-03-18 NOTE — TOC Progression Note (Signed)
Transition of Care [facility]) - Progression Note    Patient Details  Name: [name] MRN: [id number] Date of Birth: 1945/10/30  Transition of [facility] CM/SW Contact  Purcell Mouton, RN Phone Number: 03/18/2020, 3:36 PM  Clinical Narrative:     Spoke with pt's daughter [name] concerning discharge plans to SNF. [name] agreed with [facility]). [facility] was called and bed will be ready for pt on Friday. Authorization started with [facility] for SNF and transportation.   Expected Discharge Plan: [place] Barriers to Discharge: No Barriers Identified  Expected Discharge Plan and Services Expected Discharge Plan: [place]   Discharge Planning Services: CM Consult   Living arrangements for the past 2 months: Single Family Home                                       Social Determinants of Health (SDOH) Interventions    Readmission Risk Interventions No flowsheet data found.

## 2020-03-18 NOTE — Progress Notes (Signed)
1 Day Post-Op Subjective: [name] is tolerating foley well. Urine clear. Chaplain Woody here and I got to sing along for a hymn which was nice.   Objective: Vital signs in last 24 hours: Temp:  [97.6 F (36.4 C)-98.5 F (36.9 C)] 97.8 F (36.6 C) (12/08 0421) Pulse Rate:  [47-95] 71 (12/08 0421) Resp:  [13-18] 14 (12/08 0421) BP: (138-169)/(67-90) 151/90 (12/08 0421) SpO2:  [92 %-100 %] 96 % (12/07 2010) Weight:  [106.7 kg] 106.7 kg (12/08 0421)  Intake/Output from previous day: 12/07 0701 - 12/08 0700 In: 1090 [P.O.:240; I.V.:800; IV Piggyback:50] Out: 8889 [Urine:950; Drains:385; Blood:10] Intake/Output this shift: Total I/O In: 200 [IV Piggyback:200] Out: 320 [Urine:300; Drains:20]  Physical Exam:  Watching TV, singing  Urine clear   Lab Results: Recent Labs    03/16/20 2305 03/17/20 0541 03/18/20 0350  HGB 8.2* 8.0* 9.2*  HCT 25.7* 24.8* 28.6*   BMET Recent Labs    03/17/20 0541 03/18/20 0350  NA 138 138  K 4.7 5.4*  CL 110 110  CO2 21* 18*  GLUCOSE 142* 287*  BUN 22 21  CREATININE 1.28* 1.27*  CALCIUM 7.6* 8.1*   No results for input(s): LABPT, INR in the last 72 hours. No results for input(s): LABURIN in the last 72 hours. Results for orders placed or performed during the hospital encounter of 03/03/20  Culture, blood (routine x 2)     Status: Abnormal   Collection Time: 03/03/20  7:04 PM   Specimen: BLOOD LEFT HAND  Result Value Ref Range Status   Specimen Description   Final    BLOOD LEFT HAND Performed at [facility] [address] [place], [place] [address]    Special Requests   Final    BOTTLES DRAWN AEROBIC ONLY Blood Culture adequate volume Performed at [facility] [address] [place], [place] [address]    Culture  Setup Time   Final    AEROBIC BOTTLE ONLY GRAM NEGATIVE RODS CRITICAL RESULT CALLED TO, READ BACK BY AND VERIFIED WITH: Ulice Bold PharmD 9:20 03/06/20 ([email or web address]) Performed at  [facility], [place] [address], [place], [place] [address]    Culture ENTEROBACTER CLOACAE (A)  Final   Report Status 03/08/2020 FINAL  Final   Organism ID, Bacteria ENTEROBACTER CLOACAE  Final      Susceptibility   Enterobacter cloacae - MIC*    CEFAZOLIN >=64 RESISTANT Resistant     CEFEPIME <=0.12 SENSITIVE Sensitive     CEFTAZIDIME <=1 SENSITIVE Sensitive     CIPROFLOXACIN <=0.25 SENSITIVE Sensitive     GENTAMICIN <=1 SENSITIVE Sensitive     IMIPENEM <=0.25 SENSITIVE Sensitive     TRIMETH/SULFA <=20 SENSITIVE Sensitive     PIP/TAZO <=4 SENSITIVE Sensitive     * ENTEROBACTER CLOACAE  Blood Culture ID Panel (Reflexed)     Status: Abnormal   Collection Time: 03/03/20  7:04 PM  Result Value Ref Range Status   Enterococcus faecalis NOT DETECTED NOT DETECTED Final   Enterococcus Faecium NOT DETECTED NOT DETECTED Final   Listeria monocytogenes NOT DETECTED NOT DETECTED Final   Staphylococcus species NOT DETECTED NOT DETECTED Final   Staphylococcus aureus (BCID) NOT DETECTED NOT DETECTED Final   Staphylococcus epidermidis NOT DETECTED NOT DETECTED Final   Staphylococcus lugdunensis NOT DETECTED NOT DETECTED Final   Streptococcus species NOT DETECTED NOT DETECTED Final   Streptococcus agalactiae NOT DETECTED NOT DETECTED Final   Streptococcus pneumoniae NOT DETECTED NOT DETECTED Final  Streptococcus pyogenes NOT DETECTED NOT DETECTED Final   A.calcoaceticus-baumannii NOT DETECTED NOT DETECTED Final   Bacteroides fragilis NOT DETECTED NOT DETECTED Final   Enterobacterales DETECTED (A) NOT DETECTED Final    Comment: Enterobacterales represent a large order of gram negative bacteria, not a single organism. CRITICAL RESULT CALLED TO, READ BACK BY AND VERIFIED WITH: Ulice Bold PharmD 9:20 03/06/20 ([email or web address])    Enterobacter cloacae complex DETECTED (A) NOT DETECTED Final    Comment: CRITICAL RESULT CALLED TO, READ BACK BY AND VERIFIED WITH: Ulice Bold PharmD 9:20 03/06/20 ([email or web address])     Escherichia coli NOT DETECTED NOT DETECTED Final   Klebsiella aerogenes NOT DETECTED NOT DETECTED Final   Klebsiella oxytoca NOT DETECTED NOT DETECTED Final   Klebsiella pneumoniae NOT DETECTED NOT DETECTED Final   Proteus species NOT DETECTED NOT DETECTED Final   Salmonella species NOT DETECTED NOT DETECTED Final   Serratia marcescens NOT DETECTED NOT DETECTED Final   Haemophilus influenzae NOT DETECTED NOT DETECTED Final   Neisseria meningitidis NOT DETECTED NOT DETECTED Final   Pseudomonas aeruginosa NOT DETECTED NOT DETECTED Final   Stenotrophomonas maltophilia NOT DETECTED NOT DETECTED Final   Candida albicans NOT DETECTED NOT DETECTED Final   Candida auris NOT DETECTED NOT DETECTED Final   Candida glabrata NOT DETECTED NOT DETECTED Final   Candida krusei NOT DETECTED NOT DETECTED Final   Candida parapsilosis NOT DETECTED NOT DETECTED Final   Candida tropicalis NOT DETECTED NOT DETECTED Final   Cryptococcus neoformans/gattii NOT DETECTED NOT DETECTED Final   CTX-M ESBL NOT DETECTED NOT DETECTED Final   Carbapenem resistance IMP NOT DETECTED NOT DETECTED Final   Carbapenem resistance KPC NOT DETECTED NOT DETECTED Final   Carbapenem resistance NDM NOT DETECTED NOT DETECTED Final   Carbapenem resist OXA 48 LIKE NOT DETECTED NOT DETECTED Final   Carbapenem resistance VIM NOT DETECTED NOT DETECTED Final    Comment: Performed at [facility], [address] [place], [place] [address]  Culture, blood (routine x 2)     Status: None   Collection Time: 03/03/20  7:06 PM   Specimen: BLOOD RIGHT HAND  Result Value Ref Range Status   Specimen Description   Final    BLOOD RIGHT HAND Performed at [facility] [address] [place], [place] [address]    Special Requests   Final    BOTTLES DRAWN AEROBIC ONLY Blood Culture adequate volume Performed at [facility] [address], [place], [place] [address]    Culture   Final    NO GROWTH 5  DAYS Performed at [facility], [place] [address], [place], [place] [address]    Report Status 03/08/2020 FINAL  Final  Aerobic/Anaerobic Culture (surgical/deep wound)     Status: None   Collection Time: 03/04/20  3:38 PM   Specimen: Abscess  Result Value Ref Range Status   Specimen Description   Final    ABSCESS DRAIN LEFT LATERAL THIGH Performed at [facility] [address] [place], [place] [address]    Special Requests   Final    NONE Performed at [facility], [place] [address] [place], [place] [address]    Gram Stain   Final    ABUNDANT WBC PRESENT,BOTH PMN AND MONONUCLEAR ABUNDANT GRAM NEGATIVE RODS    Culture   Final    ABUNDANT ENTEROBACTER CLOACAE NO ANAEROBES ISOLATED Performed at [facility], [place] [address] [place], [place] [address]  Report Status 03/09/2020 FINAL  Final   Organism ID, Bacteria ENTEROBACTER CLOACAE  Final      Susceptibility   Enterobacter cloacae - MIC*    CEFAZOLIN >=64 RESISTANT Resistant     CEFEPIME <=0.12 SENSITIVE Sensitive     CEFTAZIDIME <=1 SENSITIVE Sensitive     CIPROFLOXACIN <=0.25 SENSITIVE Sensitive     GENTAMICIN <=1 SENSITIVE Sensitive     IMIPENEM 0.5 SENSITIVE Sensitive     TRIMETH/SULFA <=20 SENSITIVE Sensitive     PIP/TAZO <=4 SENSITIVE Sensitive     * ABUNDANT ENTEROBACTER CLOACAE  Culture, Urine     Status: Abnormal   Collection Time: 03/05/20  8:10 AM   Specimen: Urine, Random  Result Value Ref Range Status   Specimen Description   Final    URINE, RANDOM Performed at [facility], [place] [address] [place], [place] [address]    Special Requests   Final    NONE Performed at [facility], [place] [address], [place], [place] [address]    Culture >=100,000 COLONIES/mL ENTEROBACTER SPECIES (A)  Final   Report Status 03/07/2020 FINAL  Final   Organism ID, Bacteria ENTEROBACTER SPECIES (A)  Final      Susceptibility   Enterobacter  species - MIC*    CEFAZOLIN >=64 RESISTANT Resistant     CEFEPIME <=0.12 SENSITIVE Sensitive     CEFTRIAXONE <=0.25 SENSITIVE Sensitive     CIPROFLOXACIN <=0.25 SENSITIVE Sensitive     GENTAMICIN <=1 SENSITIVE Sensitive     IMIPENEM 0.5 SENSITIVE Sensitive     NITROFURANTOIN 32 SENSITIVE Sensitive     TRIMETH/SULFA <=20 SENSITIVE Sensitive     PIP/TAZO <=4 SENSITIVE Sensitive     * >=100,000 COLONIES/mL ENTEROBACTER SPECIES  Culture, blood (Routine X 2) w Reflex to ID Panel     Status: None   Collection Time: 03/09/20 12:50 PM   Specimen: BLOOD  Result Value Ref Range Status   Specimen Description   Final    BLOOD RIGHT WRIST Performed at [facility] [address] [place], [place] [address]    Special Requests   Final    BOTTLES DRAWN AEROBIC ONLY Blood Culture adequate volume Performed at [facility] [address], [place], [place] [address]    Culture   Final    NO GROWTH 5 DAYS Performed at [facility], [place] [address] [place], [place] [address]    Report Status 03/14/2020 FINAL  Final  Culture, blood (Routine X 2) w Reflex to ID Panel     Status: None   Collection Time: 03/09/20 12:50 PM   Specimen: BLOOD RIGHT HAND  Result Value Ref Range Status   Specimen Description   Final    BLOOD RIGHT HAND Performed at [facility] [address] [place], [place] [address]    Special Requests   Final    BOTTLES DRAWN AEROBIC AND ANAEROBIC Blood Culture adequate volume Performed at [facility] [address], [place], [place] [address]    Culture   Final    NO GROWTH 5 DAYS Performed at [facility], [place] [address] [place], [place] [address]    Report Status 03/14/2020 FINAL  Final  Aerobic/Anaerobic Culture (surgical/deep wound)     Status: None   Collection Time: 03/09/20  3:53 PM   Specimen: PATH Other; Tissue  Result Value Ref Range Status   Specimen Description   Final     ABSCESS  RIGHT HIP Performed at [facility], [place] [address] [place], [place] [address]    Special Requests   Final    NONE Performed at [facility], [place] [address], [place], [place] [address]    Gram Stain   Final    FEW WBC PRESENT,BOTH PMN AND MONONUCLEAR FEW GRAM NEGATIVE RODS RARE GRAM POSITIVE COCCI    Culture   Final    FEW ENTEROBACTER CLOACAE NO ANAEROBES ISOLATED Performed at [facility], [place] [address] [place], [place] [address]    Report Status 03/14/2020 FINAL  Final   Organism ID, Bacteria ENTEROBACTER CLOACAE  Final      Susceptibility   Enterobacter cloacae - MIC*    CEFAZOLIN >=64 RESISTANT Resistant     CEFEPIME <=0.12 SENSITIVE Sensitive     CEFTAZIDIME <=1 SENSITIVE Sensitive     CIPROFLOXACIN <=0.25 SENSITIVE Sensitive     GENTAMICIN <=1 SENSITIVE Sensitive     IMIPENEM 0.5 SENSITIVE Sensitive     TRIMETH/SULFA <=20 SENSITIVE Sensitive     PIP/TAZO <=4 SENSITIVE Sensitive     * FEW ENTEROBACTER CLOACAE  Aerobic/Anaerobic Culture (surgical/deep wound)     Status: None (Preliminary result)   Collection Time: 03/17/20 11:30 AM   Specimen: Abscess  Result Value Ref Range Status   Specimen Description   Final    ABSCESS THIGH LT Performed at [facility] [address], [place], [place] [address]    Special Requests   Final    NONE Performed at [facility], [place] [address] [place], [place] [address]    Gram Stain   Final    ABUNDANT WBC PRESENT, PREDOMINANTLY PMN NO ORGANISMS SEEN    Culture   Final    CULTURE REINCUBATED FOR BETTER GROWTH Performed at [facility], [place] [address] [place], [place] [address]    Report Status PENDING  Incomplete    Studies/Results: IR US Guide Bx Asp/Drain  Result Date: 03/17/2020 INDICATION: CHRONIC LEFT THIGH HEMATOMA/ABSCESS EXAM: ULTRASOUND DRAINAGE LEFT ANTERIOR THIGH HEMATOMA/ABSCESS MEDICATIONS: The patient is  currently admitted to the hospital and receiving intravenous antibiotics. The antibiotics were administered within an appropriate time frame prior to the initiation of the procedure. ANESTHESIA/SEDATION: Fentanyl 100 mcg IV; Versed 2.0 mg IV Moderate Sedation Time:  13 minutes The patient was continuously monitored during the procedure by the interventional radiology nurse under my direct supervision. COMPLICATIONS: None. PROCEDURE: Informed written consent was obtained from the patient after a thorough discussion of the procedural risks, benefits and alternatives. All questions were addressed. Maximal Sterile Barrier Technique was utilized including caps, mask, sterile gowns, sterile gloves, sterile drape, hand hygiene and skin antiseptic. A timeout was performed prior to the initiation of the procedure. Previous imaging reviewed. Preliminary ultrasound performed. A complex fluid collection was localized in the anterior proximal thigh. Overlying skin marked. Under sterile conditions and local anesthesia, an 18 gauge introducer needle was advanced from an anterior approach into the complex fluid collection. Needle position confirmed with ultrasound. Images obtained for documentation. Guidewire inserted followed by tract dilatation insert a 14 French drain. Drain catheter position confirmed with ultrasound. Syringe aspiration yielded 150 cc dark bloody fluid compatible with old hematoma. Sample sent for culture. Catheter secured with a Prolene suture and connected to external suction bulb. Sterile dressing applied no immediate complication. Patient tolerated procedure well. IMPRESSION: Ultrasound-guided left anterior thigh hematoma/abscess drain (14 [place]). Electronically Signed   By: Jerilynn Mages.  Shick M.D.  On: 03/17/2020 11:59   DG C-Arm 1-60 Min-No Report  Result Date: 03/17/2020 Fluoroscopy was utilized by the requesting physician.  No radiographic interpretation.    Assessment/Plan:  Retained bilateral stents  s/p right stent removal, left ureteroscopy, laser lithotripsy and stent exchange 03/17/2020 --   The left stone was difficult to access but completely removed. I will leave foley/stent (left stent string taped to foley) for another few days. WBC and Cr look good. I sent a urine cx from OR yesterday.   LOS: 14 days   Festus Aloe 03/18/2020, 12:30 PM

## 2020-03-18 NOTE — Progress Notes (Signed)
Pt alert and aware sitting up in bed. He states he had some surgery and he is doing better. The Dr came in while we where there and he remarked about the hymn singing. So we sang one for him. The chaplain offered caring and supportive presence, prayers and blessings. We sang some hymns together. Further visits will be offered.

## 2020-03-18 NOTE — Progress Notes (Signed)
   Subjective: 1 Day Post-Op Procedure(s) (LRB): CYSTOSCOPY/URETEROSCOPY/HOLMIUM LASER/STENT LEFT PLACEMENT (Bilateral) Patient reports pain as mild.   Patient seen in rounds for Dr. Wynelle Link. Patient is well, and has had no acute complaints or problems. States he is feeling better status post stent exchange. Hip feels well. Denies chest pain or SOB.  Objective: Vital signs in last 24 hours: Temp:  [97.6 F (36.4 C)-98.5 F (36.9 C)] 97.8 F (36.6 C) (12/08 0421) Pulse Rate:  [47-95] 71 (12/08 0421) Resp:  [12-18] 14 (12/08 0421) BP: (122-169)/(60-90) 151/90 (12/08 0421) SpO2:  [92 %-100 %] 96 % (12/07 2010) Weight:  [106.7 kg] 106.7 kg (12/08 0421)  Intake/Output from previous day:  Intake/Output Summary (Last 24 hours) at 03/18/2020 0839 Last data filed at 03/18/2020 0746 Gross per 24 hour  Intake 1290 ml  Output 1265 ml  Net 25 ml    Intake/Output this shift: Total I/O In: 200 [IV Piggyback:200] Out: 320 [Urine:300; Drains:20]  Labs: Recent Labs    03/16/20 0350 03/16/20 1025 03/16/20 2305 03/17/20 0541 03/18/20 0350  HGB 6.8* 6.8* 8.2* 8.0* 9.2*   Recent Labs    03/17/20 0541 03/18/20 0350  WBC 6.5 5.4  RBC 2.65* 3.04*  HCT 24.8* 28.6*  PLT 271 349   Recent Labs    03/17/20 0541 03/18/20 0350  NA 138 138  K 4.7 5.4*  CL 110 110  CO2 21* 18*  BUN 22 21  CREATININE 1.28* 1.27*  GLUCOSE 142* 287*  CALCIUM 7.6* 8.1*   Exam: General - Patient is Alert and Oriented Extremity - Neurologically intact Neurovascular intact Sensation intact distally Dorsiflexion/Plantar flexion intact Dressing/Incision - Aquacel with bloody drainage Motor Function - intact, moving foot and toes well on exam.   Past Medical History:  Diagnosis Date  . Arthritis   . Asthma    no inhaler  . Coronary artery disease    cardiologist-  dr spruill; last visit 3 mos ago per pt  . GERD (gastroesophageal reflux disease)   . History of MI (myocardial infarction)    1985   . Hydronephrosis, left   . Hypertension   . Myocardial infarction ([place])   . Nephrolithiasis    left  . 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 Presence of tooth-root and mandibular implants    lower dental implants  . Prostate cancer ([place] [facility])   . Shortness of breath    WITH EXERTION  . Type 2 diabetes mellitus (HCC)     Assessment/Plan: 1 Day Post-Op Procedure(s) (LRB): CYSTOSCOPY/URETEROSCOPY/HOLMIUM LASER/STENT LEFT PLACEMENT (Bilateral) Active Problems:   OA (osteoarthritis) of hip   Type 2 diabetes mellitus with hypoglycemia without coma (HCC)   Essential hypertension   Hyperlipidemia with target LDL less than 70   Hip pain   Effusion of hip joint, left   Abdominal distension   SBO (small bowel obstruction) (HCC)   Bacteremia  Estimated body mass index is 34.73 kg/m as calculated from the following:   Height as of this encounter: 5\' 9"  (1.753 m).   Weight as of this encounter: 106.7 kg. Up with therapy  DVT Prophylaxis - On hold currently due to anemia. This is improved at 9.2 this AM. Weight-bearing as tolerated  New aquacel applied today. Will continue to monitor drainage from surgical incision. Continue working with PT.  Theresa Duty, PA-C Orthopedic Surgery [phone] 03/18/2020, 8:39 AM

## 2020-03-18 NOTE — Progress Notes (Signed)
Occupational Therapy Treatment Patient Details Name: [name] MRN: [id number] DOB: 12/26/1945 Today's Date: 03/18/2020    History of present illness Pt is 74 yo male admitted to ED from [facility] with L hip pain and fluid collection/septic arthritis. He is S/P I&D and bearing surface exchange 03/09/20 of L posterior THA. Pt found to have SBO vs ileus - surgery believes is ileus on 03/12/20 - per surgery mobilize as tolerated. Hx of R THA 2013, L THA 2001, DM, CAD, CKD, anemia. s/p Cystoscopy with right retrograde pyelogram, right diagnostic ureteroscopy, left ureteroscopy laser lithotripsy, right stent removal, left ureteral stent exchange on 12/7.   OT comments  Patient demonstrated improved ability to perform transfers - with min guard for supine to sit, standing and transfer to recliner. Patient limited by lines and leads at this time. Patient required verbal cues for trunk positioning to maintain hip precautions with sit to stand and vice versa. Patient introduced to hip kit and demonstrated ability to don and doff socks with AE after instruction. Patient instructed he will need larger sock aide for personal use at home. Patient needed to perform sit to stand from recliner - once again needed verbal cue for trunk positioning but did well with standing from low seat. Cont to recommend short term rehab at discharge.   Follow Up Recommendations  SNF    Equipment Recommendations       Recommendations for Other Services      Precautions / Restrictions Precautions Precautions: Fall;Posterior Hip Precaution Comments: S/p left hip drain placement 03/17/20 Restrictions Weight Bearing Restrictions: No LLE Weight Bearing: Weight bearing as tolerated       Mobility Bed Mobility Overal bed mobility: Needs Assistance Bed Mobility: Supine to Sit     Supine to sit: Min guard;HOB elevated     General bed mobility comments: Patient min guard to transfer to side of bed. Use of bed  rail and increased time.  Transfers Overall transfer level: Needs assistance Equipment used: Rolling walker (2 wheeled) Transfers: Sit to/from Stand Sit to Stand: From elevated surface;Min guard Stand pivot transfers: Min guard       General transfer comment: Min guard with RW to stand and transfer to recliner.    Balance Overall balance assessment: Needs assistance Sitting-balance support: No upper extremity supported;Feet supported Sitting balance-Leahy Scale: Good     Standing balance support: Bilateral upper extremity supported;During functional activity Standing balance-Leahy Scale: Poor Standing balance comment: required RW                           ADL either performed or assessed with clinical judgement   ADL                       Lower Body Dressing: Supervision/safety;Adhering to hip precautions Lower Body Dressing Details (indicate cue type and reason): Patient introduced to ADL kit. Patient instructed on how to remove socks with LHR and don socks with sock aide in seated position.                     Vision Patient Visual Report: No change from baseline     Perception     Praxis      Cognition Arousal/Alertness: Awake/alert Behavior During Therapy: WFL for tasks assessed/performed Overall Cognitive Status: Within Functional Limits for tasks assessed  General Comments: Very pleasant and friendly        Exercises Total Joint Exercises Ankle Circles/Pumps: 20 reps;AROM;Supine;Both Quad Sets: AROM;10 reps;Both;Supine   Shoulder Instructions       General Comments      Pertinent Vitals/ Pain       Pain Assessment: No/denies pain Pain Intervention(s): Monitored during session  Home Living                                          Prior Functioning/Environment              Frequency  Min 2X/week        Progress Toward Goals  OT Goals(current  goals can now be found in the care plan section)  Progress towards OT goals: Progressing toward goals  Acute Rehab OT Goals Patient Stated Goal: regain independence OT Goal Formulation: With patient Time For Goal Achievement: 03/25/20 Potential to Achieve Goals: Good  Plan      Co-evaluation                 AM-PAC OT "6 Clicks" Daily Activity     Outcome Measure   Help from another person eating meals?: None Help from another person taking care of personal grooming?: A Little Help from another person toileting, which includes using toliet, bedpan, or urinal?: A Lot Help from another person bathing (including washing, rinsing, drying)?: A Lot Help from another person to put on and taking off regular upper body clothing?: A Little Help from another person to put on and taking off regular lower body clothing?: A Lot 6 Click Score: 16    End of Session Equipment Utilized During Treatment: Gait belt;Rolling walker  OT Visit Diagnosis: Other abnormalities of gait and mobility (R26.89);Muscle weakness (generalized) (M62.81);Pain;Unsteadiness on feet (R26.81)   Activity Tolerance Patient tolerated treatment well   Patient Left in chair;with call bell/phone within reach;with chair alarm set   Nurse Communication Mobility status        Time: 4196-2229 OT Time Calculation (min): 28 min  Charges: OT General Charges $OT Visit: 1 Visit OT Treatments $Self Care/Home Management : 8-22 mins $Therapeutic Activity: 8-22 mins  Ardith Test, OTR/L [place]  Office [phone] Pager: [place] 03/18/2020, 4:05 PM

## 2020-03-18 NOTE — Progress Notes (Signed)
   Covid-19 Vaccination Clinic  Name:  NAREK KNISS    MRN: [id number] DOB: [birth year]  03/18/2020  Mr. Holquin was observed post Covid-19 immunization for 15 minutes without incident. He was provided with Vaccine Information Sheet and instruction to access the V-Safe system.   Mr. Stanco was instructed to call 911 with any severe reactions post vaccine: [place] Kitchen Difficulty breathing  . Swelling of face and throat  . A fast heartbeat  . A bad rash all over body  . Dizziness and weakness   Immunizations Administered    No immunizations on file.

## 2020-03-18 NOTE — Progress Notes (Signed)
Referring Physician(s): Dr. Posey Pronto   Supervising Physician: Markus Daft  Patient Status:  [facility] - In-pt  Chief Complaint: Left hip abscess; S/p left hip drain placement 03/17/20 by Dr. Annamaria Boots  Subjective: The patient is sitting up in the chair in his room with a male visitor at the bedside. He states he is feeling good and has been ambulating in the room.   Allergies: Shellfish allergy  Medications: Prior to Admission medications   Medication Sig Start Date End Date Taking? Authorizing Provider  albuterol (VENTOLIN HFA) 108 (90 Base) MCG/ACT inhaler Inhale 2 puffs into the lungs every 6 (six) hours as needed for wheezing or shortness of breath. 08/09/19 03/03/20 Yes Luetta Nutting, DO  amLODipine (NORVASC) 10 MG tablet Take 10 mg by mouth daily.   Yes [provider]  Aspirin 81 MG CAPS Take 81 mg by mouth daily.    Yes [provider]  atorvastatin (LIPITOR) 40 MG tablet Take 1 tablet (40 mg total) by mouth daily. 06/26/16  Yes Tat, Shanon Brow, MD  carvedilol (COREG) 25 MG tablet Take 25 mg by mouth 2 (two) times daily with a meal.   Yes [provider]  co-enzyme Q-10 30 MG capsule Take 30 mg by mouth daily.   Yes [provider]  cycloSPORINE (RESTASIS) 0.05 % ophthalmic emulsion Place 1 drop into both eyes 2 (two) times daily.  09/23/19  Yes [provider]  diclofenac Sodium (VOLTAREN) 1 % GEL Apply 4 g topically 4 (four) times daily. 03/01/20  Yes Deno Etienne, DO  docusate sodium (COLACE) 100 MG capsule Take 100 mg by mouth daily as needed for mild constipation.   Yes [provider]  ferrous sulfate 325 (65 FE) MG tablet Take 325 mg by mouth daily with breakfast.   Yes [provider]  gabapentin (NEURONTIN) 300 MG capsule Take 1 capsule (300 mg total) by mouth 3 (three) times daily. Start 300mg  at bedtime x1 week then may increase to three times per day. Patient taking differently: Take 300 mg by mouth in the morning, at noon,  and at bedtime.  10/21/19  Yes Luetta Nutting, DO  glimepiride (AMARYL) 4 MG tablet Take 4 mg by mouth daily before breakfast.   Yes [provider]  hydrochlorothiazide (HYDRODIURIL) 25 MG tablet Take 25 mg by mouth daily. 02/28/20  Yes [provider]  morphine (MSIR) 15 MG tablet Take 0.5 tablets (7.5 mg total) by mouth every 4 (four) hours as needed for severe pain. 03/01/20  Yes Deno Etienne, DO  predniSONE (DELTASONE) 50 MG tablet Take daily until resolution of gout symptoms. Patient taking differently: Take 50 mg by mouth daily. Take daily until resolution of gout symptoms. 02/21/20  Yes Luetta Nutting, DO  sitaGLIPtin (JANUVIA) 50 MG tablet Take 1 tablet (50 mg total) by mouth daily. 06/26/16  Yes Tat, Shanon Brow, MD  spironolactone (ALDACTONE) 25 MG tablet Take 25 mg by mouth daily. 02/28/20  Yes [provider]  Continuous Blood Gluc Receiver (FREESTYLE LIBRE 14 DAY READER) Max See admin instructions. 05/23/19   [provider]  Continuous Blood Gluc Sensor (FREESTYLE LIBRE 14 DAY SENSOR) MISC 1 Device by Does not apply route every 14 (fourteen) days. 05/23/19   Luetta Nutting, DO  omeprazole (PRILOSEC) 40 MG capsule Take 1 capsule (40 mg total) by mouth 2 (two) times daily. X 1 month, then once daily Patient not taking: Reported on 03/03/2020 06/26/16   Orson Eva, MD     Vital Signs: BP  128/64 (BP Location: Right Arm)   Pulse 76   Temp 98.1 F (36.7 C) (Oral)   Resp 17   Ht 5\' 9"  (1.753 m)   Wt 235 lb 3.2 oz (106.7 kg)   SpO2 94%   BMI 34.73 kg/m   Physical Exam Constitutional:      General: He is not in acute distress. Cardiovascular:     Rate and Rhythm: Normal rate and regular rhythm.  Pulmonary:     Effort: Pulmonary effort is normal.  Musculoskeletal:        General: Swelling and tenderness present.     Comments: Left thigh with mild swelling and tenderness; drain to suction. Skin insertion site is clean and dry and without redness or  drainage. Approximately 25 ml of bloody fluid in bulb.   Skin:    General: Skin is warm and dry.  Neurological:     Mental Status: He is alert and oriented to person, place, and time.     Imaging: CT HIP LEFT WO CONTRAST  Result Date: 03/16/2020 CLINICAL DATA:  Clinical concern for septic arthritis EXAM: CT OF THE LEFT HIP WITHOUT CONTRAST TECHNIQUE: Multidetector CT imaging of the left hip was performed according to the standard protocol. Multiplanar CT image reconstructions were also generated. COMPARISON:  03/01/2020 FINDINGS: Bones/Joint/Cartilage Status post left total hip arthroplasty. Arthroplasty components are in their expected alignment. No evidence of acute periprosthetic fracture. Similar periprosthetic lucency along the posterior cortex of the proximal femoral stem measuring approximately 7.2 cm in length (series 8, image 59), similar to prior. Similar degree of periprosthetic lucency surrounding the anterolateral aspect of the proximal femur (series 3, image 89). No air is seen within bone, as was evident on the previous study. Large complex fluid and air containing collection surrounding the proximal femoral component measuring approximately 13 x 7.5 x 10 cm (series 4, image 85; series 9, image 76). There is air surrounding the femoral component at the level of the femoral neck. Again seen is extension of ill-defined fluid and air within the left adductor muscle compartment (series 4, image 95). New fluid and air containing collection is now seen within the anterior compartment musculature of the proximal to mid left thigh, which appears centered in the rectus femoris muscle. This collection measures approximately 14.0 x 3.1 x 8.3 cm (series 10, image 65; series 4, image 139). Ligaments Suboptimally assessed by CT. Muscles and Tendons Intramuscular fluid collection within the rectus femoris muscle, as above. Ill-defined adductor compartment collection, as above. Soft tissues Circumferential  subcutaneous edema most pronounced at the anterolateral aspect of the proximal thigh. IMPRESSION: 1. Status post left total hip arthroplasty with similar degree of periprosthetic lucency surrounding the proximal femoral component concerning for septic loosening. 2. Large complex fluid and air containing collection surrounding the proximal femoral component measuring up to 13 cm, compatible with infection/abscess. 3. New large fluid and air containing collection within the anterior compartment musculature of the proximal to mid left thigh, which appears centered in the rectus femoris muscle. Collection measures up to 14 cm in length. Findings are compatible with an intramuscular abscess. Electronically Signed   By: Davina Poke D.O.   On: 03/16/2020 09:56   IR US Guide Bx Asp/Drain  Result Date: 03/17/2020 INDICATION: CHRONIC LEFT THIGH HEMATOMA/ABSCESS EXAM: ULTRASOUND DRAINAGE LEFT ANTERIOR THIGH HEMATOMA/ABSCESS MEDICATIONS: The patient is currently admitted to the hospital and receiving intravenous antibiotics. The antibiotics were administered within an appropriate time frame prior to the initiation of the procedure. ANESTHESIA/SEDATION:  Fentanyl 100 mcg IV; Versed 2.0 mg IV Moderate Sedation Time:  13 minutes The patient was continuously monitored during the procedure by the interventional radiology nurse under my direct supervision. COMPLICATIONS: None. PROCEDURE: Informed written consent was obtained from the patient after a thorough discussion of the procedural risks, benefits and alternatives. All questions were addressed. Maximal Sterile Barrier Technique was utilized including caps, mask, sterile gowns, sterile gloves, sterile drape, hand hygiene and skin antiseptic. A timeout was performed prior to the initiation of the procedure. Previous imaging reviewed. Preliminary ultrasound performed. A complex fluid collection was localized in the anterior proximal thigh. Overlying skin marked. Under  sterile conditions and local anesthesia, an 18 gauge introducer needle was advanced from an anterior approach into the complex fluid collection. Needle position confirmed with ultrasound. Images obtained for documentation. Guidewire inserted followed by tract dilatation insert a 14 French drain. Drain catheter position confirmed with ultrasound. Syringe aspiration yielded 150 cc dark bloody fluid compatible with old hematoma. Sample sent for culture. Catheter secured with a Prolene suture and connected to external suction bulb. Sterile dressing applied no immediate complication. Patient tolerated procedure well. IMPRESSION: Ultrasound-guided left anterior thigh hematoma/abscess drain (14 [place]). Electronically Signed   By: Jerilynn Mages.  Shick M.D.   On: 03/17/2020 11:59   DG C-Arm 1-60 Min-No Report  Result Date: 03/17/2020 Fluoroscopy was utilized by the requesting physician.  No radiographic interpretation.   VAS [place] LOWER EXTREMITY VENOUS (DVT)  Result Date: 03/16/2020  Lower Venous DVT Study Indications: Edema.  Comparison Study: no prior Performing Technologist: Abram Sander RVS  Examination Guidelines: A complete evaluation includes B-mode imaging, spectral Doppler, color Doppler, and power Doppler as needed of all accessible portions of each vessel. Bilateral testing is considered an integral part of a complete examination. Limited examinations for reoccurring indications may be performed as noted. The reflux portion of the exam is performed with the patient in reverse Trendelenburg.  +-----+---------------+---------+-----------+----------+--------------+ RIGHTCompressibilityPhasicitySpontaneityPropertiesThrombus Aging +-----+---------------+---------+-----------+----------+--------------+ CFV  Full           Yes      Yes                                 +-----+---------------+---------+-----------+----------+--------------+    +---------+---------------+---------+-----------+----------+-------------------+ LEFT     CompressibilityPhasicitySpontaneityPropertiesThrombus Aging      +---------+---------------+---------+-----------+----------+-------------------+ CFV      Full           Yes      Yes                                      +---------+---------------+---------+-----------+----------+-------------------+ SFJ      Full                                                             +---------+---------------+---------+-----------+----------+-------------------+ FV Prox  Full                                                             +---------+---------------+---------+-----------+----------+-------------------+ FV  Mid   Full                                                             +---------+---------------+---------+-----------+----------+-------------------+ FV DistalFull                                                             +---------+---------------+---------+-----------+----------+-------------------+ PFV      Full                                                             +---------+---------------+---------+-----------+----------+-------------------+ POP      Full           Yes      Yes                                      +---------+---------------+---------+-----------+----------+-------------------+ PTV      Full                                                             +---------+---------------+---------+-----------+----------+-------------------+ PERO                                                  Not well visualized +---------+---------------+---------+-----------+----------+-------------------+     Summary: RIGHT: - No evidence of common femoral vein obstruction.  LEFT: - There is no evidence of deep vein thrombosis in the lower extremity.  - No cystic structure found in the popliteal fossa.  *See table(s) above for measurements and  observations. Electronically signed by Ruta Hinds MD on 03/16/2020 at 5:02:47 PM.    Final     Labs:  CBC: Recent Labs    03/16/20 1025 03/16/20 2305 03/17/20 0541 03/18/20 0350  WBC 7.7 7.9 6.5 5.4  HGB 6.8* 8.2* 8.0* 9.2*  HCT 21.9* 25.7* 24.8* 28.6*  PLT 311 303 271 349    COAGS: Recent Labs    03/04/20 1252  INR 1.1    BMP: Recent Labs    10/24/19 0807 10/24/19 0807 12/06/19 0736 12/06/19 0736 01/06/20 0751 01/06/20 0751 01/13/20 0716 01/14/20 1027 03/15/20 0415 03/16/20 0350 03/17/20 0541 03/18/20 0350  NA 135   < > 137   < > 136   < > 136   < > 138 138 138 138  K 4.8   < > 5.4*   < > 5.3   < > 6.4*   < > 4.7 4.8 4.7 5.4*  CL 109   < > 109   < > 108   < >  110   < > 110 111 110 110  CO2 18*   < > 21   < > 20   < > 19*   < > 20* 19* 21* 18*  GLUCOSE 145*   < > 152*   < > 107*   < > 58*   < > 161* 139* 142* 287*  BUN 33*   < > 43*   < > 55*   < > 52*   < > 21 23 22 21   CALCIUM 9.0   < > 9.0   < > 9.0   < > 8.9   < > 7.7* 7.4* 7.6* 8.1*  CREATININE 1.62*   < > 2.02*   < > 3.11*   < > 3.31*   < > 1.33* 1.41* 1.28* 1.27*  GFRNONAA 41*   < > 32*   < > 19*   < > 17*   < > 56* 52* 59* 59*  GFRAA 48*  --  37*  --  22*  --  20*  --   --   --   --   --    < > = values in this interval not displayed.    LIVER FUNCTION TESTS: Recent Labs    03/08/20 0610 03/09/20 0419 03/10/20 0633 03/12/20 0450  BILITOT 0.5 0.6 0.9 0.5  AST 22 44* 76* 35  ALT 17 33 67* 49*  ALKPHOS 101 94 97 83  PROT 7.3 7.1 6.9 6.0*  ALBUMIN 2.4* 2.3* 2.4* 2.3*    Assessment and Plan:  Left hip abscess, s/p left drain placement 03/17/20: 385 ml output documented in Epic, another 25 ml of bloody fluid in JP bulb. Patient is afebrile, WBC is 5.4. He has decreased pain and tenderness in his left thigh. Cultures are still pending.   Please continue flushing drain three times daily and documenting the output. IR will consider re-imaging when output is minimal for a few days.   Other  plans per primary teams, IR will continue to follow.   Electronically Signed: Soyla Dryer, AGACNP-BC [phone] 03/18/2020, 3:34 PM   I spent a total of 15 Minutes at the the patient's bedside AND on the patient's hospital floor or unit, greater than 50% of which was counseling/coordinating care for left hip abscess drain.

## 2020-03-18 NOTE — Progress Notes (Addendum)
PROGRESS NOTE  JOVANNY STEPHANIE XAJ:[id number] DOB: 04-26-1945 DOA: 03/03/2020 PCP: Luetta Nutting, DO   LOS: 14 days   Brief Narrative / Interim history: 74 year old male with CAD, DM 2, HTN, CKD 3B, came into the hospital as a direct admit from the orthopedics office secondary to left hip pain.  He was found to have septic arthritis, Enterobacter bacteremia, acute kidney injury.  Orthopedic surgery, ID, urology all consulted.  Subjective / 24h Interval events: He is doing well this morning, less hip pain.  Assessment & Plan: Principal Problem Left hip septic arthritis/prosthetic joint infection/abscess, Enterobacter bacteremia -Orthopedic surgery consulted and followed patient while hospitalized.  He was taken to the OR on 11/29 status post I&D of the left hip with liner exchange for periprosthetic joint infection.  Cultures grew Enterobacter cloacae.  ID was consulted and recommended several weeks of antibiotics up until April 21, 2019, followed by oral antibiotics.  It is believed that the source may be urinary in origin as his urine cultures also grew same bacteria.  Following clearance of surveillance blood cultures patient eventually underwent a tunneled CVC on 11/30  Active Problems Nephrolithiasis with history of left-sided hydronephrosis -Patient with history of nephrolithiasis requiring urgent bilateral stents with Dr. Tresa Moore in October 2021 for acute renal failure with a 7 mm left distal stone and small bilateral LP stones.  He was seen as an outpatient by urology on 11/15 and was doing well, and plans were in place for patient to have bilateral ureteroscopy, laser lithotripsy and stent exchange but meanwhile he was admitted with bacteremia and left hip infection.  Urology consulted while here and was eventually taken to the [facility] on 12/7 by Dr. Junious Silk and underwent cystoscopy with right retrograde pyelogram, right diagnostic ureteroscopy, left ureteroscopy laser lithotripsy, right  stent removal and left ureteral stent exchange. -Currently has a Foley in place  Paroxysmal A. fib/flutter with RVR -Primary cardiologist is Dr. [name], he was not anticoagulated prior to admission but was anticoagulated here but Dr [name] recommended anticoagulation on discharge.  This is now being on hold for the development of the left thigh hematoma and probably need to hold anticoagulation on discharge up until outpatient follow-up -Continue amiodarone, diltiazem  Acute kidney injury on chronic kidney disease stage IIIb -Baseline creatinine 1.2-1.4, as high as 2.5 during this admission.  Now at baseline  Left thigh hematoma, acute blood loss anemia -Patient with increased pain in the left hip as well as swelling, underwent a CT scan on 12/6 which showed a large complex fluid in the containing collection surrounding the proximal femur component measuring up to 13 cm concerning for abscess.  IR was consulted and patient underwent ultrasound-guided left anterior thigh drainage and it appears to be a hematoma.  His Lovenox has been on hold -Hemoglobin dropped to 6.8 on 12/6 requiring 2 units of packed red blood cells, currently hemoglobin is 9.2 and stable  Left leg edema -Negative ultrasound Doppler on 12/6  Obesity -Based on BMI of 34, he would benefit from weight loss  Ileus, possible partial SBO -Patient had an episode of abdominal distention, required NG tube placement at 1 point.  General surgery followed.  This is resolved and currently able to tolerate a regular diet, having bowel movements.  Coronary artery disease -On aspirin  DM2 -Most recent A1c 7.1, continue sliding scale  CBG (last 3)  Recent Labs    03/17/20 2012 03/18/20 0743 03/18/20 1228  GLUCAP 190* 223* 155*    Scheduled Meds: .  amiodarone  200 mg Oral Daily  . atorvastatin  40 mg Oral Daily  . Chlorhexidine Gluconate Cloth  6 each Topical Daily  . dextromethorphan  30 mg Oral BID  . diltiazem  120 mg  Oral Daily  . docusate sodium  100 mg Oral BID  . ferrous sulfate  325 mg Oral TID WC  . gabapentin  300 mg Oral TID  . insulin aspart  0-15 Units Subcutaneous TID WC  . insulin aspart  0-5 Units Subcutaneous QHS  . lip balm  1 application Topical BID  . metoprolol tartrate  25 mg Oral BID  . pantoprazole  40 mg Oral Daily  . psyllium  1 packet Oral QODAY  . simethicone  80 mg Oral QID  . sodium chloride flush  10-40 mL Intracatheter Q12H  . sodium chloride flush  5 mL Intracatheter Q8H  . sodium chloride flush  5 mL Intracatheter Q8H   Continuous Infusions: . ceFEPime (MAXIPIME) IV 2 g (03/18/20 1253)   PRN Meds:.acetaminophen, albuterol, alum & mag hydroxide-simeth, benzonatate, HYDROmorphone (DILAUDID) injection, menthol-cetylpyridinium, methocarbamol, metoprolol tartrate, ondansetron **OR** ondansetron (ZOFRAN) IV, oxyCODONE-acetaminophen, phenol, sodium chloride flush  Diet Orders (From admission, onward)    Start     Ordered   03/17/20 2009  Diet regular Room service appropriate? Yes; Fluid consistency: Thin  Diet effective now       Question Answer Comment  Room service appropriate? Yes   Fluid consistency: Thin      03/17/20 2008          DVT prophylaxis: SCDs Start: 03/09/20 1851 Place TED hose Start: 03/09/20 1851 Place and maintain sequential compression device Start: 03/08/20 1851     Code Status: Full Code  Family Communication: No family present  Status is: Inpatient  Remains inpatient appropriate because:Inpatient level of care appropriate due to severity of illness   Dispo: The patient is from: Home              Anticipated d/c is to: SNF              Anticipated d/c date is: 3 days              Patient currently is not medically stable to d/c.   Consultants:  ID Orthopedic surgery Urology  Procedures:  2D echo: EF 55-60%, normal LVEF, no WMA, mild concentric LV hypertrophy 11/24-CT-guided drainage placement in the left thigh abscess 11/29  I&D left hip with liner exchange for periprosthetic joint infection 11/30 placement of right IJ tunneled double-lumen CVC by IR 12/7 ultrasound-guided hematoma drain left thigh  Microbiology  Blood cultures, urine cultures, abscess cultures-Enterobacter cloacae  Antimicrobials: Currently on cefepime   Objective: Vitals:   03/17/20 1730 03/17/20 1745 03/17/20 2010 03/18/20 0421  BP: (!) 142/67 (!) 169/75 138/69 (!) 151/90  Pulse: (!) 47 91 95 71  Resp: 18 17 14 14   Temp:  98.5 F (36.9 C) 98.3 F (36.8 C) 97.8 F (36.6 C)  TempSrc:   Oral Oral  SpO2: 93% 100% 96%   Weight:    106.7 kg  Height:        Intake/Output Summary (Last 24 hours) at 03/18/2020 1436 Last data filed at 03/18/2020 1257 Gross per 24 hour  Intake 1290 ml  Output 1135 ml  Net 155 ml   Filed Weights   03/16/20 0500 03/17/20 0500 03/18/20 0421  Weight: 106.9 kg 107 kg 106.7 kg    Examination:  Constitutional: NAD Eyes: no scleral icterus ENMT:  Mucous membranes are moist.  Neck: normal, supple Respiratory: clear to auscultation bilaterally, no wheezing, no crackles. Normal respiratory effort.  Cardiovascular: Regular rate and rhythm, no murmurs / rubs / gallops. No LE edema.  Abdomen: non distended, no tenderness. Bowel sounds positive.  Musculoskeletal: no clubbing / cyanosis.  Skin: no rashes Neurologic: CN 2-12 grossly intact. Strength 5/5 in all 4.  Psychiatric: Normal judgment and insight. Alert and oriented x 3. Normal mood.    Data Reviewed: I have independently reviewed following labs and imaging studies   CBC: Recent Labs  Lab 03/16/20 0350 03/16/20 1025 03/16/20 2305 03/17/20 0541 03/18/20 0350  WBC 9.2 7.7 7.9 6.5 5.4  NEUTROABS  --   --  6.4  --   --   HGB 6.8* 6.8* 8.2* 8.0* 9.2*  HCT 22.1* 21.9* 25.7* 24.8* 28.6*  MCV 97.4 96.1 93.5 93.6 94.1  PLT 313 311 303 271 009   Basic Metabolic Panel: Recent Labs  Lab 03/14/20 0420 03/15/20 0415 03/16/20 0350 03/17/20 0541  03/18/20 0350  NA 140 138 138 138 138  K 4.8 4.7 4.8 4.7 5.4*  CL 109 110 111 110 110  CO2 22 20* 19* 21* 18*  GLUCOSE 138* 161* 139* 142* 287*  BUN 24* 21 23 22 21   CREATININE 1.21 1.33* 1.41* 1.28* 1.27*  CALCIUM 7.7* 7.7* 7.4* 7.6* 8.1*  MG 1.4* 1.6* 1.7 1.5* 1.9   Liver Function Tests: Recent Labs  Lab 03/12/20 0450  AST 35  ALT 49*  ALKPHOS 83  BILITOT 0.5  PROT 6.0*  ALBUMIN 2.3*   Coagulation Profile: No results for input(s): INR, PROTIME in the last 168 hours. HbA1C: No results for input(s): HGBA1C in the last 72 hours. CBG: Recent Labs  Lab 03/17/20 1726 03/17/20 1820 03/17/20 2012 03/18/20 0743 03/18/20 1228  GLUCAP 122* 139* 190* 223* 155*    Recent Results (from the past 240 hour(s))  Culture, blood (Routine X 2) w Reflex to ID Panel     Status: None   Collection Time: 03/09/20 12:50 PM   Specimen: BLOOD  Result Value Ref Range Status   Specimen Description   Final    BLOOD RIGHT WRIST Performed at [facility] [address] [place], [place] [address]    Special Requests   Final    BOTTLES DRAWN AEROBIC ONLY Blood Culture adequate volume Performed at [facility] [address], [place], [place] [address]    Culture   Final    NO GROWTH 5 DAYS Performed at [facility], [place] [address] [place], [place] [address]    Report Status 03/14/2020 FINAL  Final  Culture, blood (Routine X 2) w Reflex to ID Panel     Status: None   Collection Time: 03/09/20 12:50 PM   Specimen: BLOOD RIGHT HAND  Result Value Ref Range Status   Specimen Description   Final    BLOOD RIGHT HAND Performed at [facility] [address], [place], [place] [address]    Special Requests   Final    BOTTLES DRAWN AEROBIC AND ANAEROBIC Blood Culture adequate volume Performed at [facility] [address], [place], [place] [address]    Culture   Final    NO GROWTH 5 DAYS Performed at  [facility], [place] [address] [place], [place] [address]    Report Status 03/14/2020 FINAL  Final  Aerobic/Anaerobic Culture (surgical/deep wound)     Status: None  Collection Time: 03/09/20  3:53 PM   Specimen: PATH Other; Tissue  Result Value Ref Range Status   Specimen Description   Final    ABSCESS RIGHT HIP Performed at [facility] [address] [place], [place] [address]    Special Requests   Final    NONE Performed at [facility], [place] [address] [place], [place] [address]    Gram Stain   Final    FEW WBC PRESENT,BOTH PMN AND MONONUCLEAR FEW GRAM NEGATIVE RODS RARE GRAM POSITIVE COCCI    Culture   Final    FEW ENTEROBACTER CLOACAE NO ANAEROBES ISOLATED Performed at [facility], [place] [address] [place], [place] [address]    Report Status 03/14/2020 FINAL  Final   Organism ID, Bacteria ENTEROBACTER CLOACAE  Final      Susceptibility   Enterobacter cloacae - MIC*    CEFAZOLIN >=64 RESISTANT Resistant     CEFEPIME <=0.12 SENSITIVE Sensitive     CEFTAZIDIME <=1 SENSITIVE Sensitive     CIPROFLOXACIN <=0.25 SENSITIVE Sensitive     GENTAMICIN <=1 SENSITIVE Sensitive     IMIPENEM 0.5 SENSITIVE Sensitive     TRIMETH/SULFA <=20 SENSITIVE Sensitive     PIP/TAZO <=4 SENSITIVE Sensitive     * FEW ENTEROBACTER CLOACAE  Aerobic/Anaerobic Culture (surgical/deep wound)     Status: None (Preliminary result)   Collection Time: 03/17/20 11:30 AM   Specimen: Abscess  Result Value Ref Range Status   Specimen Description   Final    ABSCESS THIGH LT Performed at [facility] [address], [place], [place] [address]    Special Requests   Final    NONE Performed at [facility], [place] [address], [place], [place] [address]    Gram Stain   Final    ABUNDANT WBC PRESENT, PREDOMINANTLY PMN NO ORGANISMS SEEN    Culture   Final    RARE GRAM NEGATIVE RODS IDENTIFICATION AND SUSCEPTIBILITIES  TO FOLLOW Performed at [facility], [place] [address] [place],  [address]    Report Status PENDING  Incomplete     Radiology Studies: DG C-Arm 1-60 Min-No Report  Result Date: 03/17/2020 Fluoroscopy was utilized by the requesting physician.  No radiographic interpretation.    Marzetta Board, MD, PhD [facility]  Between 7 am - 7 pm I am available, please contact me via Amion or Securechat  Between 7 pm - 7 am I am not available, please contact night coverage MD/APP via Amion

## 2020-03-18 NOTE — Progress Notes (Signed)
Physical Therapy Treatment Patient Details Name: [name] MRN: [id number] DOB: 1945-04-22 Today's Date: 03/18/2020    History of Present Illness Pt is 74 yo male admitted to ED from [facility] with L hip pain and fluid collection/septic arthritis. He is S/P I&D and bearing surface exchange 03/09/20 of L posterior THA. Pt found to have SBO vs ileus - surgery believes is ileus on 03/12/20 - per surgery mobilize as tolerated. Hx of R THA 2013, L THA 2001, DM, CAD, CKD, anemia    PT Comments    Pt found awake in chair. Very pleasant and agreeable to PT. Begin vitals 128/64 BP, 94 O2 on RA, 76 HR. Pt able to independently recall all posterior hip precautions. Min A with sit<> stand with cues to remind pt to avoid excessive leaning forward when standing/sitting. Min A with gait, multiple cues for correct gait sequence and to keep RW at safe distance. HR between 95-107bpm throughout until the end right before returning to chair, recorded at 127 bpm. Pt declines feeling any abnormal symptoms.   Follow Up Recommendations  SNF     Equipment Recommendations  None recommended by PT    Recommendations for Other Services       Precautions / Restrictions Precautions Precautions: Fall;Posterior Hip Precaution Comments: Reviewed hip precautions (pt recalled all THP) Restrictions Weight Bearing Restrictions: No LLE Weight Bearing: Weight bearing as tolerated    Mobility  Bed Mobility               General bed mobility comments: Pt OOB  Transfers Overall transfer level: Needs assistance Equipment used: Rolling walker (2 wheeled) Transfers: Sit to/from Stand Sit to Stand: Min assist Stand pivot transfers: Min guard       General transfer comment: Cues for hand placement and reminder for hip precautions (avoid excessive leaning "nose over toes")  Ambulation/Gait Ambulation/Gait assistance: Min assist Gait Distance (Feet): 70 Feet Assistive device: Rolling walker (2  wheeled) Gait Pattern/deviations: Step-to pattern;Trunk flexed;Decreased weight shift to left;Decreased stride length Gait velocity: decreased   General Gait Details: cues for safe RW distance, and multiple cues for proper gait sequence. No LOB, stable with RW.   Stairs             Wheelchair Mobility    Modified Rankin (Stroke Patients Only)       Balance                                            Cognition Arousal/Alertness: Awake/alert Behavior During Therapy: WFL for tasks assessed/performed Overall Cognitive Status: Within Functional Limits for tasks assessed                                 General Comments: Very pleasant and friendly      Exercises Total Joint Exercises Ankle Circles/Pumps: 20 reps;AROM;Supine;Both Quad Sets: AROM;10 reps;Both;Supine    General Comments        Pertinent Vitals/Pain Pain Assessment: No/denies pain Pain Intervention(s): Monitored during session    Home Living                      Prior Function            PT Goals (current goals can now be found in the care plan section) Acute Rehab PT Goals  Patient Stated Goal: regain independence PT Goal Formulation: With patient Time For Goal Achievement: 03/18/20 Potential to Achieve Goals: Good Progress towards PT goals: Progressing toward goals    Frequency    Min 3X/week      PT Plan Current plan remains appropriate    Co-evaluation              AM-PAC PT "6 Clicks" Mobility   Outcome Measure  Help needed turning from your back to your side while in a flat bed without using bedrails?: A Little Help needed moving from lying on your back to sitting on the side of a flat bed without using bedrails?: A Little Help needed moving to and from a bed to a chair (including a wheelchair)?: A Little Help needed standing up from a chair using your arms (e.g., wheelchair or bedside chair)?: None Help needed to walk in hospital  room?: None Help needed climbing 3-5 steps with a railing? : A Little 6 Click Score: 20    End of Session Equipment Utilized During Treatment: Gait belt Activity Tolerance: Patient tolerated treatment well Patient left: with call bell/phone within reach;in chair;with chair alarm set Nurse Communication: Mobility status PT Visit Diagnosis: Muscle weakness (generalized) (M62.81);Other abnormalities of gait and mobility (R26.89);Pain Pain - Right/Left: Left Pain - part of body: Hip     Time: 1406-1430 PT Time Calculation (min) (ACUTE ONLY): 24 min  Charges:  $Gait Training: 8-22 mins $Therapeutic Exercise: 8-22 mins                     C[facility] [place], [facility] [phone]

## 2020-03-19 ENCOUNTER — Encounter (HOSPITAL_COMMUNITY): Payer: Self-pay | Admitting: Urology

## 2020-03-19 LAB — CBC
HCT: 24.5 % — ABNORMAL LOW (ref 39.0–52.0)
Hemoglobin: 7.7 g/dL — ABNORMAL LOW (ref 13.0–17.0)
MCH: 29.6 pg (ref 26.0–34.0)
MCHC: 31.4 g/dL (ref 30.0–36.0)
MCV: 94.2 fL (ref 80.0–100.0)
Platelets: 310 10*3/uL (ref 150–400)
RBC: 2.6 MIL/uL — ABNORMAL LOW (ref 4.22–5.81)
RDW: 15.3 % (ref 11.5–15.5)
WBC: 7 10*3/uL (ref 4.0–10.5)
nRBC: 0 % (ref 0.0–0.2)

## 2020-03-19 LAB — COMPREHENSIVE METABOLIC PANEL
ALT: 18 U/L (ref 0–44)
AST: 14 U/L — ABNORMAL LOW (ref 15–41)
Albumin: 1.9 g/dL — ABNORMAL LOW (ref 3.5–5.0)
Alkaline Phosphatase: 56 U/L (ref 38–126)
Anion gap: 7 (ref 5–15)
BUN: 21 mg/dL (ref 8–23)
CO2: 22 mmol/L (ref 22–32)
Calcium: 7.6 mg/dL — ABNORMAL LOW (ref 8.9–10.3)
Chloride: 110 mmol/L (ref 98–111)
Creatinine, Ser: 1.25 mg/dL — ABNORMAL HIGH (ref 0.61–1.24)
GFR, Estimated: >60 mL/min (ref >60)
Glucose, Bld: 176 mg/dL — ABNORMAL HIGH (ref 70–99)
Potassium: 4.6 mmol/L (ref 3.5–5.1)
Sodium: 139 mmol/L (ref 135–145)
Total Bilirubin: 0.4 mg/dL (ref 0.3–1.2)
Total Protein: 5.5 g/dL — ABNORMAL LOW (ref 6.5–8.1)

## 2020-03-19 LAB — TYPE AND SCREEN
ABO/RH(D): A POS
Antibody Screen: NEGATIVE
Unit division: 0
Unit division: 0

## 2020-03-19 LAB — BPAM RBC
Blood Product Expiration Date: 202112282359
Blood Product Expiration Date: 202112282359
ISSUE DATE / TIME: 202112061342
ISSUE DATE / TIME: 202112061733
Unit Type and Rh: 6200
Unit Type and Rh: 6200

## 2020-03-19 LAB — PREPARE RBC (CROSSMATCH)

## 2020-03-19 LAB — URINE CULTURE: Culture: NO GROWTH

## 2020-03-19 LAB — GLUCOSE, CAPILLARY
Glucose-Capillary: 143 mg/dL — ABNORMAL HIGH (ref 70–99)
Glucose-Capillary: 161 mg/dL — ABNORMAL HIGH (ref 70–99)
Glucose-Capillary: 132 mg/dL — ABNORMAL HIGH (ref 70–99)
Glucose-Capillary: 164 mg/dL — ABNORMAL HIGH (ref 70–99)

## 2020-03-19 LAB — MAGNESIUM: Magnesium: 1.6 mg/dL — ABNORMAL LOW (ref 1.7–2.4)

## 2020-03-19 MED ORDER — SODIUM CHLORIDE 0.9% IV SOLUTION: Freq: Once | INTRAVENOUS | Status: AC

## 2020-03-19 MED ORDER — MAGNESIUM SULFATE 2 GM/50ML IV SOLN
2.0000 g | Freq: Once | INTRAVENOUS | Status: AC
  Administered 2020-03-19: 2 g via INTRAVENOUS
  Filled 2020-03-19: qty 50

## 2020-03-19 NOTE — Progress Notes (Signed)
PROGRESS NOTE  [name] EUM:[id number] DOB: 1945-09-27 DOA: 03/03/2020 PCP: Luetta Nutting, DO   LOS: 15 days   Brief Narrative / Interim history: 74 year old male with CAD, DM 2, HTN, CKD 3B, came into the hospital as a direct admit from the orthopedics office secondary to left hip pain.  He was found to have septic arthritis, Enterobacter bacteremia, acute kidney injury.  Orthopedic surgery, ID, urology all consulted.  Subjective / 24h Interval events: Feeling well, upbeat.  Has more energy.  Assessment & Plan: Principal Problem Left hip septic arthritis/prosthetic joint infection/abscess, Enterobacter bacteremia -Orthopedic surgery consulted and followed patient while hospitalized.  He was taken to the OR on 11/29 status post I&D of the left hip with liner exchange for periprosthetic joint infection.  Cultures grew Enterobacter cloacae.  ID was consulted and recommended several weeks of antibiotics up until April 21, 2019, followed by oral antibiotics.  It is believed that the source may be urinary in origin as his urine cultures also grew same bacteria.  Following clearance of surveillance blood cultures patient eventually underwent a tunneled CVC on 11/30  Active Problems Nephrolithiasis with history of left-sided hydronephrosis -Patient with history of nephrolithiasis requiring urgent bilateral stents with Dr. Tresa Moore in October 2021 for acute renal failure with a 7 mm left distal stone and small bilateral LP stones.  He was seen as an outpatient by urology on 11/15 and was doing well, and plans were in place for patient to have bilateral ureteroscopy, laser lithotripsy and stent exchange but meanwhile he was admitted with bacteremia and left hip infection.  Urology consulted while here and was eventually taken to the [facility] on 12/7 by Dr. Junious Silk and underwent cystoscopy with right retrograde pyelogram, right diagnostic ureteroscopy, left ureteroscopy laser lithotripsy, right stent  removal and left ureteral stent exchange. -Currently has a Foley in place, urology plans to remove Foley tomorrow  Paroxysmal A. fib/flutter with RVR -Primary cardiologist is Dr. [name], he was not anticoagulated prior to admission but was anticoagulated here but Dr [name] recommended anticoagulation on discharge.  This is now being on hold for the development of the left thigh hematoma and probably need to hold anticoagulation on discharge up until outpatient follow-up -Continue amiodarone, diltiazem  Acute kidney injury on chronic kidney disease stage IIIb -Baseline creatinine 1.2-1.4, as high as 2.5 during this admission.  This morning creatinine 1.25, at baseline  Hypomagnesemia -Replete magnesium  Left thigh hematoma, acute blood loss anemia -Patient with increased pain in the left hip as well as swelling, underwent a CT scan on 12/6 which showed a large complex fluid in the containing collection surrounding the proximal femur component measuring up to 13 cm concerning for abscess.  IR was consulted and patient underwent ultrasound-guided left anterior thigh drainage and it appears to be a hematoma.  His Lovenox has been on hold -Hemoglobin dropped to 6.8 on 12/6 requiring 2 units of packed red blood cells, hemoglobin currently dipped lower to 7.7, transfuse an additional unit today  Left leg edema -Negative ultrasound Doppler on 12/6  Obesity -Based on BMI of 34, he would benefit from weight loss  Ileus, possible partial SBO -Patient had an episode of abdominal distention, required NG tube placement at 1 point.  General surgery followed.  This is resolved and currently able to tolerate a regular diet, having bowel movements.  Coronary artery disease -On aspirin  DM2 -Most recent A1c 7.1, continue sliding scale  CBG (last 3)  Recent Labs    03/18/20  1647 03/18/20 1939 03/19/20 0720  GLUCAP 178* 181* 143*    Scheduled Meds: . sodium chloride   Intravenous Once  .  amiodarone  200 mg Oral Daily  . atorvastatin  40 mg Oral Daily  . Chlorhexidine Gluconate Cloth  6 each Topical Daily  . dextromethorphan  30 mg Oral BID  . diltiazem  120 mg Oral Daily  . docusate sodium  100 mg Oral BID  . ferrous sulfate  325 mg Oral TID WC  . gabapentin  300 mg Oral TID  . insulin aspart  0-15 Units Subcutaneous TID WC  . insulin aspart  0-5 Units Subcutaneous QHS  . lip balm  1 application Topical BID  . metoprolol tartrate  25 mg Oral BID  . pantoprazole  40 mg Oral Daily  . psyllium  1 packet Oral QODAY  . simethicone  80 mg Oral QID  . sodium chloride flush  10-40 mL Intracatheter Q12H  . sodium chloride flush  5 mL Intracatheter Q8H  . sodium chloride flush  5 mL Intracatheter Q8H   Continuous Infusions: . ceFEPime (MAXIPIME) IV 2 g (03/19/20 0554)   PRN Meds:.acetaminophen, albuterol, alum & mag hydroxide-simeth, benzonatate, HYDROmorphone (DILAUDID) injection, menthol-cetylpyridinium, methocarbamol, metoprolol tartrate, ondansetron **OR** ondansetron (ZOFRAN) IV, oxyCODONE-acetaminophen, phenol, sodium chloride flush  Diet Orders (From admission, onward)    Start     Ordered   03/17/20 2009  Diet regular Room service appropriate? Yes; Fluid consistency: Thin  Diet effective now       Question Answer Comment  Room service appropriate? Yes   Fluid consistency: Thin      03/17/20 2008          DVT prophylaxis: SCDs Start: 03/09/20 1851 Place TED hose Start: 03/09/20 1851 Place and maintain sequential compression device Start: 03/08/20 1851     Code Status: Full Code  Family Communication: No family present  Status is: Inpatient  Remains inpatient appropriate because:Inpatient level of care appropriate due to severity of illness   Dispo: The patient is from: Home              Anticipated d/c is to: SNF              Anticipated d/c date is: 3 days              Patient currently is not medically stable to d/c.   Consultants:   ID Orthopedic surgery Urology  Procedures:  2D echo: EF 55-60%, normal LVEF, no WMA, mild concentric LV hypertrophy 11/24-CT-guided drainage placement in the left thigh abscess 11/29 I&D left hip with liner exchange for periprosthetic joint infection 11/30 placement of right IJ tunneled double-lumen CVC by IR 12/7 ultrasound-guided hematoma drain left thigh  Microbiology  Blood cultures, urine cultures, abscess cultures-Enterobacter cloacae  Antimicrobials: Currently on cefepime   Objective: Vitals:   03/18/20 1941 03/18/20 2215 03/19/20 0300 03/19/20 0355  BP: (!) 148/85  (!) 128/55   Pulse:   71   Resp: 17  15   Temp: 98.1 F (36.7 C)     TempSrc: Oral     SpO2:  95% 96% 98%  Weight:      Height:        Intake/Output Summary (Last 24 hours) at 03/19/2020 1037 Last data filed at 03/19/2020 5027 Gross per 24 hour  Intake 360 ml  Output 685 ml  Net -325 ml   Filed Weights   03/16/20 0500 03/17/20 0500 03/18/20 0421  Weight: 106.9 kg 107  kg 106.7 kg    Examination:  Constitutional: No distress Eyes: No scleral icterus ENMT: Moist external drains Neck: normal, supple Respiratory: Lungs are clear bilaterally without wheeze or crackles Cardiovascular: Regular rate and rhythm, no murmurs, no edema on right, trace edema on left lower extremity Abdomen: Soft, nontender, nondistended, bowel sounds positive Musculoskeletal: no clubbing / cyanosis.  Skin: No rashes seen Neurologic: Nonfocal  Data Reviewed: I have independently reviewed following labs and imaging studies   CBC: Recent Labs  Lab 03/16/20 1025 03/16/20 2305 03/17/20 0541 03/18/20 0350 03/19/20 0450  WBC 7.7 7.9 6.5 5.4 7.0  NEUTROABS  --  6.4  --   --   --   HGB 6.8* 8.2* 8.0* 9.2* 7.7*  HCT 21.9* 25.7* 24.8* 28.6* 24.5*  MCV 96.1 93.5 93.6 94.1 94.2  PLT 311 303 271 349 762   Basic Metabolic Panel: Recent Labs  Lab 03/15/20 0415 03/16/20 0350 03/17/20 0541 03/18/20 0350  03/19/20 0450  NA 138 138 138 138 139  K 4.7 4.8 4.7 5.4* 4.6  CL 110 111 110 110 110  CO2 20* 19* 21* 18* 22  GLUCOSE 161* 139* 142* 287* 176*  BUN 21 23 22 21 21   CREATININE 1.33* 1.41* 1.28* 1.27* 1.25*  CALCIUM 7.7* 7.4* 7.6* 8.1* 7.6*  MG 1.6* 1.7 1.5* 1.9 1.6*   Liver Function Tests: Recent Labs  Lab 03/19/20 0450  AST 14*  ALT 18  ALKPHOS 56  BILITOT 0.4  PROT 5.5*  ALBUMIN 1.9*   Coagulation Profile: No results for input(s): INR, PROTIME in the last 168 hours. HbA1C: No results for input(s): HGBA1C in the last 72 hours. CBG: Recent Labs  Lab 03/18/20 0743 03/18/20 1228 03/18/20 1647 03/18/20 1939 03/19/20 0720  GLUCAP 223* 155* 178* 181* 143*    Recent Results (from the past 240 hour(s))  Culture, blood (Routine X 2) w Reflex to ID Panel     Status: None   Collection Time: 03/09/20 12:50 PM   Specimen: BLOOD  Result Value Ref Range Status   Specimen Description   Final    BLOOD RIGHT WRIST Performed at [facility] [address] [place], [place] [address]    Special Requests   Final    BOTTLES DRAWN AEROBIC ONLY Blood Culture adequate volume Performed at [facility] [address], [place], [place] [address]    Culture   Final    NO GROWTH 5 DAYS Performed at [facility], [place] [address], [place], [place] [address]    Report Status 03/14/2020 FINAL  Final  Culture, blood (Routine X 2) w Reflex to ID Panel     Status: None   Collection Time: 03/09/20 12:50 PM   Specimen: BLOOD RIGHT HAND  Result Value Ref Range Status   Specimen Description   Final    BLOOD RIGHT HAND Performed at [facility] [address] [place], [place] [address]    Special Requests   Final    BOTTLES DRAWN AEROBIC AND ANAEROBIC Blood Culture adequate volume Performed at [facility] [address] [place], [place] [address]    Culture   Final    NO GROWTH 5 DAYS Performed at  [facility], [place] [address] [place], [place] [address]    Report Status 03/14/2020 FINAL  Final  Aerobic/Anaerobic Culture (surgical/deep wound)     Status: None   Collection Time: 03/09/20  3:53 PM   Specimen: PATH Other;  Tissue  Result Value Ref Range Status   Specimen Description   Final    ABSCESS RIGHT HIP Performed at [facility] [address], [place], [place] [address]    Special Requests   Final    NONE Performed at [facility], [place] [address] [place], [place] [address]    Gram Stain   Final    FEW WBC PRESENT,BOTH PMN AND MONONUCLEAR FEW GRAM NEGATIVE RODS RARE GRAM POSITIVE COCCI    Culture   Final    FEW ENTEROBACTER CLOACAE NO ANAEROBES ISOLATED Performed at [facility], [place] [address] [place], [place] [address]    Report Status 03/14/2020 FINAL  Final   Organism ID, Bacteria ENTEROBACTER CLOACAE  Final      Susceptibility   Enterobacter cloacae - MIC*    CEFAZOLIN >=64 RESISTANT Resistant     CEFEPIME <=0.12 SENSITIVE Sensitive     CEFTAZIDIME <=1 SENSITIVE Sensitive     CIPROFLOXACIN <=0.25 SENSITIVE Sensitive     GENTAMICIN <=1 SENSITIVE Sensitive     IMIPENEM 0.5 SENSITIVE Sensitive     TRIMETH/SULFA <=20 SENSITIVE Sensitive     PIP/TAZO <=4 SENSITIVE Sensitive     * FEW ENTEROBACTER CLOACAE  Aerobic/Anaerobic Culture (surgical/deep wound)     Status: None (Preliminary result)   Collection Time: 03/17/20 11:30 AM   Specimen: Abscess  Result Value Ref Range Status   Specimen Description   Final    ABSCESS THIGH LT Performed at [facility] [address] [place], [place] [address]    Special Requests   Final    NONE Performed at [facility], [place] [address] [place], [place] [address]    Gram Stain   Final    ABUNDANT WBC PRESENT, PREDOMINANTLY PMN NO ORGANISMS SEEN    Culture   Final    RARE GRAM NEGATIVE RODS IDENTIFICATION AND SUSCEPTIBILITIES  TO FOLLOW Performed at [facility], [place] [address] [place], [place] [address]    Report Status PENDING  Incomplete  Urine Culture     Status: None   Collection Time: 03/17/20  4:00 PM   Specimen: Urine, Cystoscope  Result Value Ref Range Status   Specimen Description   Final    URINE, RANDOM CYSTO Performed at [facility], [place] [address], [place], [place] [address]    Special Requests   Final    NONE Performed at [facility], [place] [address] [place], [place] [address]    Culture   Final    NO GROWTH Performed at [facility], [place] [address], [place], [place] [address]    Report Status 03/19/2020 FINAL  Final     Radiology Studies: No results found.  Marzetta Board, MD, PhD [facility]  Between 7 am - 7 pm I am available, please contact me via Amion or Securechat  Between 7 pm - 7 am I am not available, please contact night coverage MD/APP via Amion

## 2020-03-19 NOTE — Progress Notes (Signed)
Subjective: 2 Days Post-Op Procedure(s) (LRB): CYSTOSCOPY/URETEROSCOPY/HOLMIUM LASER/STENT LEFT PLACEMENT (Bilateral) Patient reports pain as mild.   Thigh swelling decreased and he feels better  Objective: Vital signs in last 24 hours: Temp:  [98.1 F (36.7 C)] 98.1 F (36.7 C) (12/08 1941) Pulse Rate:  [71-76] 71 (12/09 0300) Resp:  [15-17] 15 (12/09 0300) BP: (128-148)/(55-85) 128/55 (12/09 0300) SpO2:  [94 %-98 %] 98 % (12/09 0355)  Intake/Output from previous day: 12/08 0701 - 12/09 0700 In: 440 [P.O.:240; IV Piggyback:200] Out: 1005 [Urine:900; Drains:105] Intake/Output this shift: Total I/O In: 240 [P.O.:240] Out: 665 [Urine:600; Drains:65]  Recent Labs    03/16/20 1025 03/16/20 2305 03/17/20 0541 03/18/20 0350 03/19/20 0450  HGB 6.8* 8.2* 8.0* 9.2* 7.7*   Recent Labs    03/18/20 0350 03/19/20 0450  WBC 5.4 7.0  RBC 3.04* 2.60*  HCT 28.6* 24.5*  PLT 349 310   Recent Labs    03/18/20 0350 03/19/20 0450  NA 138 139  K 5.4* 4.6  CL 110 110  CO2 18* 22  BUN 21 21  CREATININE 1.27* 1.25*  GLUCOSE 287* 176*  CALCIUM 8.1* 7.6*   No results for input(s): LABPT, INR in the last 72 hours.  Neurologically intact Neurovascular intact No cellulitis present Compartment soft Thigh swelling decreased. Small amount of bloody drainage in drain tube. No purulence  Incision clean with no drainage or erythema   Assessment/Plan: 2 Days Post-Op Procedure(s) (LRB): CYSTOSCOPY/URETEROSCOPY/HOLMIUM LASER/STENT LEFT PLACEMENT (Bilateral) Up with therapy  Continue drain Continue antibiotics   [name] 03/19/2020, 6:53 AM

## 2020-03-19 NOTE — Plan of Care (Signed)

## 2020-03-19 NOTE — Progress Notes (Addendum)
Referring Physician(s): Aluisio,F/Patel,P  Supervising Physician: Sandi Mariscal  Patient Status:  [facility] - In-pt  Chief Complaint: Left hip/thigh hematoma/abscess   Subjective: Pt doing ok this am; denies worsening left hip pain; less thigh edema   Allergies: Shellfish allergy  Medications: Prior to Admission medications   Medication Sig Start Date End Date Taking? Authorizing Provider  albuterol (VENTOLIN HFA) 108 (90 Base) MCG/ACT inhaler Inhale 2 puffs into the lungs every 6 (six) hours as needed for wheezing or shortness of breath. 08/09/19 03/03/20 Yes Luetta Nutting, DO  amLODipine (NORVASC) 10 MG tablet Take 10 mg by mouth daily.   Yes [provider]  Aspirin 81 MG CAPS Take 81 mg by mouth daily.    Yes [provider]  atorvastatin (LIPITOR) 40 MG tablet Take 1 tablet (40 mg total) by mouth daily. 06/26/16  Yes Tat, Shanon Brow, MD  carvedilol (COREG) 25 MG tablet Take 25 mg by mouth 2 (two) times daily with a meal.   Yes [provider]  co-enzyme Q-10 30 MG capsule Take 30 mg by mouth daily.   Yes [provider]  cycloSPORINE (RESTASIS) 0.05 % ophthalmic emulsion Place 1 drop into both eyes 2 (two) times daily.  09/23/19  Yes [provider]  diclofenac Sodium (VOLTAREN) 1 % GEL Apply 4 g topically 4 (four) times daily. 03/01/20  Yes Deno Etienne, DO  docusate sodium (COLACE) 100 MG capsule Take 100 mg by mouth daily as needed for mild constipation.   Yes [provider]  ferrous sulfate 325 (65 FE) MG tablet Take 325 mg by mouth daily with breakfast.   Yes [provider]  gabapentin (NEURONTIN) 300 MG capsule Take 1 capsule (300 mg total) by mouth 3 (three) times daily. Start 300mg  at bedtime x1 week then may increase to three times per day. Patient taking differently: Take 300 mg by mouth in the morning, at noon, and at bedtime.  10/21/19  Yes Luetta Nutting, DO  glimepiride (AMARYL) 4 MG tablet Take 4 mg by mouth  daily before breakfast.   Yes [provider]  hydrochlorothiazide (HYDRODIURIL) 25 MG tablet Take 25 mg by mouth daily. 02/28/20  Yes [provider]  morphine (MSIR) 15 MG tablet Take 0.5 tablets (7.5 mg total) by mouth every 4 (four) hours as needed for severe pain. 03/01/20  Yes Deno Etienne, DO  predniSONE (DELTASONE) 50 MG tablet Take daily until resolution of gout symptoms. Patient taking differently: Take 50 mg by mouth daily. Take daily until resolution of gout symptoms. 02/21/20  Yes Luetta Nutting, DO  sitaGLIPtin (JANUVIA) 50 MG tablet Take 1 tablet (50 mg total) by mouth daily. 06/26/16  Yes Tat, Shanon Brow, MD  spironolactone (ALDACTONE) 25 MG tablet Take 25 mg by mouth daily. 02/28/20  Yes [provider]  Continuous Blood Gluc Receiver (FREESTYLE LIBRE 14 DAY READER) [place] See admin instructions. 05/23/19   [provider]  Continuous Blood Gluc Sensor (FREESTYLE LIBRE 14 DAY SENSOR) MISC 1 Device by Does not apply route every 14 (fourteen) days. 05/23/19   Luetta Nutting, DO  omeprazole (PRILOSEC) 40 MG capsule Take 1 capsule (40 mg total) by mouth 2 (two) times daily. X 1 month, then once daily Patient not taking: Reported on 03/03/2020 06/26/16   Orson Eva, MD     Vital Signs: BP (!) 128/55 (BP Location: Left Arm)   Pulse 71   Temp 98.1 F (36.7 C) (Oral)   Resp 15   Ht 5\' 9"  (1.753  m)   Wt 235 lb 3.2 oz (106.7 kg)   SpO2 98%   BMI 34.73 kg/m   Physical Exam awake, alert.  Left hip/thigh drain intact, insertion site okay, mildly tender, output 105 cc of bloody fluid; drain flushed earlier this morning  Imaging: CT HIP LEFT WO CONTRAST  Result Date: 03/16/2020 CLINICAL DATA:  Clinical concern for septic arthritis EXAM: CT OF THE LEFT HIP WITHOUT CONTRAST TECHNIQUE: Multidetector CT imaging of the left hip was performed according to the standard protocol. Multiplanar CT image reconstructions were also generated. COMPARISON:  03/01/2020  FINDINGS: Bones/Joint/Cartilage Status post left total hip arthroplasty. Arthroplasty components are in their expected alignment. No evidence of acute periprosthetic fracture. Similar periprosthetic lucency along the posterior cortex of the proximal femoral stem measuring approximately 7.2 cm in length (series 8, image 59), similar to prior. Similar degree of periprosthetic lucency surrounding the anterolateral aspect of the proximal femur (series 3, image 89). No air is seen within bone, as was evident on the previous study. Large complex fluid and air containing collection surrounding the proximal femoral component measuring approximately 13 x 7.5 x 10 cm (series 4, image 85; series 9, image 76). There is air surrounding the femoral component at the level of the femoral neck. Again seen is extension of ill-defined fluid and air within the left adductor muscle compartment (series 4, image 95). New fluid and air containing collection is now seen within the anterior compartment musculature of the proximal to mid left thigh, which appears centered in the rectus femoris muscle. This collection measures approximately 14.0 x 3.1 x 8.3 cm (series 10, image 65; series 4, image 139). Ligaments Suboptimally assessed by CT. Muscles and Tendons Intramuscular fluid collection within the rectus femoris muscle, as above. Ill-defined adductor compartment collection, as above. Soft tissues Circumferential subcutaneous edema most pronounced at the anterolateral aspect of the proximal thigh. IMPRESSION: 1. Status post left total hip arthroplasty with similar degree of periprosthetic lucency surrounding the proximal femoral component concerning for septic loosening. 2. Large complex fluid and air containing collection surrounding the proximal femoral component measuring up to 13 cm, compatible with infection/abscess. 3. New large fluid and air containing collection within the anterior compartment musculature of the proximal to mid  left thigh, which appears centered in the rectus femoris muscle. Collection measures up to 14 cm in length. Findings are compatible with an intramuscular abscess. Electronically Signed   By: Davina Poke D.O.   On: 03/16/2020 09:56   IR US Guide Bx Asp/Drain  Result Date: 03/17/2020 INDICATION: CHRONIC LEFT THIGH HEMATOMA/ABSCESS EXAM: ULTRASOUND DRAINAGE LEFT ANTERIOR THIGH HEMATOMA/ABSCESS MEDICATIONS: The patient is currently admitted to the hospital and receiving intravenous antibiotics. The antibiotics were administered within an appropriate time frame prior to the initiation of the procedure. ANESTHESIA/SEDATION: Fentanyl 100 mcg IV; Versed 2.0 mg IV Moderate Sedation Time:  13 minutes The patient was continuously monitored during the procedure by the interventional radiology nurse under my direct supervision. COMPLICATIONS: None. PROCEDURE: Informed written consent was obtained from the patient after a thorough discussion of the procedural risks, benefits and alternatives. All questions were addressed. Maximal Sterile Barrier Technique was utilized including caps, mask, sterile gowns, sterile gloves, sterile drape, hand hygiene and skin antiseptic. A timeout was performed prior to the initiation of the procedure. Previous imaging reviewed. Preliminary ultrasound performed. A complex fluid collection was localized in the anterior proximal thigh. Overlying skin marked. Under sterile conditions and local anesthesia, an 18 gauge introducer needle was advanced from an  anterior approach into the complex fluid collection. Needle position confirmed with ultrasound. Images obtained for documentation. Guidewire inserted followed by tract dilatation insert a 14 French drain. Drain catheter position confirmed with ultrasound. Syringe aspiration yielded 150 cc dark bloody fluid compatible with old hematoma. Sample sent for culture. Catheter secured with a Prolene suture and connected to external suction bulb.  Sterile dressing applied no immediate complication. Patient tolerated procedure well. IMPRESSION: Ultrasound-guided left anterior thigh hematoma/abscess drain (14 [place]). Electronically Signed   By: Jerilynn Mages.  Shick M.D.   On: 03/17/2020 11:59   DG C-Arm 1-60 Min-No Report  Result Date: 03/17/2020 Fluoroscopy was utilized by the requesting physician.  No radiographic interpretation.   VAS [place] LOWER EXTREMITY VENOUS (DVT)  Result Date: 03/16/2020  Lower Venous DVT Study Indications: Edema.  Comparison Study: no prior Performing Technologist: Abram Sander RVS  Examination Guidelines: A complete evaluation includes B-mode imaging, spectral Doppler, color Doppler, and power Doppler as needed of all accessible portions of each vessel. Bilateral testing is considered an integral part of a complete examination. Limited examinations for reoccurring indications may be performed as noted. The reflux portion of the exam is performed with the patient in reverse Trendelenburg.  +-----+---------------+---------+-----------+----------+--------------+ RIGHTCompressibilityPhasicitySpontaneityPropertiesThrombus Aging +-----+---------------+---------+-----------+----------+--------------+ CFV  Full           Yes      Yes                                 +-----+---------------+---------+-----------+----------+--------------+   +---------+---------------+---------+-----------+----------+-------------------+ LEFT     CompressibilityPhasicitySpontaneityPropertiesThrombus Aging      +---------+---------------+---------+-----------+----------+-------------------+ CFV      Full           Yes      Yes                                      +---------+---------------+---------+-----------+----------+-------------------+ SFJ      Full                                                             +---------+---------------+---------+-----------+----------+-------------------+ FV Prox  Full                                                              +---------+---------------+---------+-----------+----------+-------------------+ FV Mid   Full                                                             +---------+---------------+---------+-----------+----------+-------------------+ FV DistalFull                                                             +---------+---------------+---------+-----------+----------+-------------------+  PFV      Full                                                             +---------+---------------+---------+-----------+----------+-------------------+ POP      Full           Yes      Yes                                      +---------+---------------+---------+-----------+----------+-------------------+ PTV      Full                                                             +---------+---------------+---------+-----------+----------+-------------------+ PERO                                                  Not well visualized +---------+---------------+---------+-----------+----------+-------------------+     Summary: RIGHT: - No evidence of common femoral vein obstruction.  LEFT: - There is no evidence of deep vein thrombosis in the lower extremity.  - No cystic structure found in the popliteal fossa.  *See table(s) above for measurements and observations. Electronically signed by Ruta Hinds MD on 03/16/2020 at 5:02:47 PM.    Final     Labs:  CBC: Recent Labs    03/16/20 2305 03/17/20 0541 03/18/20 0350 03/19/20 0450  WBC 7.9 6.5 5.4 7.0  HGB 8.2* 8.0* 9.2* 7.7*  HCT 25.7* 24.8* 28.6* 24.5*  PLT 303 271 349 310    COAGS: Recent Labs    03/04/20 1252  INR 1.1    BMP: Recent Labs    10/24/19 0807 12/06/19 0736 01/06/20 0751 01/13/20 0716 01/14/20 1027 03/16/20 0350 03/17/20 0541 03/18/20 0350 03/19/20 0450  NA 135 137 136 136   < > 138 138 138 139  K 4.8 5.4* 5.3 6.4*   < > 4.8 4.7 5.4* 4.6  CL 109 109  108 110   < > 111 110 110 110  CO2 18* 21 20 19*   < > 19* 21* 18* 22  GLUCOSE 145* 152* 107* 58*   < > 139* 142* 287* 176*  BUN 33* 43* 55* 52*   < > 23 22 21 21   CALCIUM 9.0 9.0 9.0 8.9   < > 7.4* 7.6* 8.1* 7.6*  CREATININE 1.62* 2.02* 3.11* 3.31*   < > 1.41* 1.28* 1.27* 1.25*  GFRNONAA 41* 32* 19* 17*   < > 52* 59* 59* >60  GFRAA 48* 37* 22* 20*  --   --   --   --   --    < > = values in this interval not displayed.    LIVER FUNCTION TESTS: Recent Labs    03/09/20 0419 03/10/20 0633 03/12/20 0450 03/19/20 0450  BILITOT 0.6 0.9 0.5 0.4  AST 44* 76* 35 14*  ALT 33 67* 49* 18  ALKPHOS 94 97 83  62  PROT 7.1 6.9 6.0* 5.5*  ALBUMIN 2.3* 2.4* 2.3* 1.9*    Assessment and Plan: 74 y.o. male History of CAD, DM, HTN, CKD, prostate cancer renal stones with hydronephrosis s/p ureteral stents  OA s/p left hip arthroplasty in 2001. Admitted from the orthopedic care to the ED at [facility] on 11.23.21 with left hip pain, chills, nausea and diarrhea found to have septic arthritis with a  fluid and air collection with the left adductor muscle s/p percutaneous abscess drain placed by IR on 11.24.21. the patient was taken to surgery on 11.29.21 for an I&D of the left hip with liner exchange. The IR drain was removed at that time.  Due to ongoing pain and swelling of the left thigh  the Team ordered a CT left hip from 12.6.21. CT  reads Large complex fluid and air containing collection surrounding the proximal femoral component measuring up to 13 cm, compatible with infection/abscess; s/p new drain placement 12/7; afebrile; WBC nl; hgb 7.7(9.2)- ?transfuse, creat 1.25; latest drain fluid cx pend(rare gm neg rods); monitor labs closely; cont drain irrigation/output monitoring; check follow-up CT once drain output is minimal or if clinical status worsens.   Electronically Signed: D. Rowe Robert, PA-C 03/19/2020, 9:53 AM   I spent a total of  15 minutes at the the patient's bedside AND on the patient's  hospital floor or unit, greater than 50% of which was counseling/coordinating care for left hip/thigh hematoma/abscess drain    Patient ID: [name], male   DOB: 11-06-1945, 74 y.o.   MRN: [id number]

## 2020-03-19 NOTE — Plan of Care (Signed)
  Problem: Clinical Measurements: Goal: Will remain free from infection Outcome: Progressing Goal: Diagnostic test results will improve Outcome: Progressing   Problem: Activity: Goal: Risk for activity intolerance will decrease Outcome: Progressing   Problem: Pain Managment: Goal: General experience of comfort will improve Outcome: Progressing   Problem: Safety: Goal: Ability to remain free from injury will improve Outcome: Progressing   Problem: Skin Integrity: Goal: Risk for impaired skin integrity will decrease Outcome: Progressing   Problem: Activity: Goal: Risk for activity intolerance will decrease Outcome: Progressing

## 2020-03-20 LAB — CBC
HCT: 28.2 % — ABNORMAL LOW (ref 39.0–52.0)
Hemoglobin: 9 g/dL — ABNORMAL LOW (ref 13.0–17.0)
MCH: 29.6 pg (ref 26.0–34.0)
MCHC: 31.9 g/dL (ref 30.0–36.0)
MCV: 92.8 fL (ref 80.0–100.0)
Platelets: 352 10*3/uL (ref 150–400)
RBC: 3.04 MIL/uL — ABNORMAL LOW (ref 4.22–5.81)
RDW: 15.9 % — ABNORMAL HIGH (ref 11.5–15.5)
WBC: 7.6 10*3/uL (ref 4.0–10.5)
nRBC: 0 % (ref 0.0–0.2)

## 2020-03-20 LAB — GLUCOSE, CAPILLARY
Glucose-Capillary: 133 mg/dL — ABNORMAL HIGH (ref 70–99)
Glucose-Capillary: 156 mg/dL — ABNORMAL HIGH (ref 70–99)
Glucose-Capillary: 162 mg/dL — ABNORMAL HIGH (ref 70–99)
Glucose-Capillary: 139 mg/dL — ABNORMAL HIGH (ref 70–99)

## 2020-03-20 LAB — BPAM RBC
Blood Product Expiration Date: 202201052359
ISSUE DATE / TIME: 202112091217
Unit Type and Rh: 6200

## 2020-03-20 LAB — BASIC METABOLIC PANEL
Anion gap: 7 (ref 5–15)
BUN: 20 mg/dL (ref 8–23)
CO2: 21 mmol/L — ABNORMAL LOW (ref 22–32)
Calcium: 7.5 mg/dL — ABNORMAL LOW (ref 8.9–10.3)
Chloride: 110 mmol/L (ref 98–111)
Creatinine, Ser: 1.14 mg/dL (ref 0.61–1.24)
GFR, Estimated: >60 mL/min (ref >60)
Glucose, Bld: 178 mg/dL — ABNORMAL HIGH (ref 70–99)
Potassium: 4.7 mmol/L (ref 3.5–5.1)
Sodium: 138 mmol/L (ref 135–145)

## 2020-03-20 LAB — TYPE AND SCREEN
ABO/RH(D): A POS
Antibody Screen: NEGATIVE
Unit division: 0

## 2020-03-20 LAB — RESP PANEL BY RT-PCR (FLU A&B, COVID) ARPGX2
Influenza A by PCR: NEGATIVE
Influenza B by PCR: NEGATIVE
SARS Coronavirus 2 by RT PCR: NEGATIVE

## 2020-03-20 LAB — MAGNESIUM: Magnesium: 1.5 mg/dL — ABNORMAL LOW (ref 1.7–2.4)

## 2020-03-20 MED ORDER — SENNOSIDES-DOCUSATE SODIUM 8.6-50 MG PO TABS
2.0000 | ORAL_TABLET | Freq: Two times a day (BID) | ORAL | Status: DC
  Administered 2020-03-20 (×2): 2 via ORAL
  Filled 2020-03-20 (×2): qty 2

## 2020-03-20 MED ORDER — AMIODARONE HCL 200 MG PO TABS
200.0000 mg | ORAL_TABLET | Freq: Every day | ORAL | Status: DC
Start: 2020-03-21 — End: 2020-05-18

## 2020-03-20 MED ORDER — HEPARIN SOD (PORK) LOCK FLUSH 100 UNIT/ML IV SOLN
250.0000 [IU] | INTRAVENOUS | Status: AC | PRN
  Administered 2020-03-20: 250 [IU]
  Filled 2020-03-20: qty 2.5

## 2020-03-20 MED ORDER — METOPROLOL TARTRATE 25 MG PO TABS
25.0000 mg | ORAL_TABLET | Freq: Two times a day (BID) | ORAL | Status: DC
Start: 2020-03-20 — End: 2020-03-20

## 2020-03-20 MED ORDER — OXYCODONE-ACETAMINOPHEN 5-325 MG PO TABS: 1.0000 | ORAL_TABLET | ORAL | 0 refills | Status: DC | PRN

## 2020-03-20 MED ORDER — DILTIAZEM HCL ER COATED BEADS 120 MG PO CP24
120.0000 mg | ORAL_CAPSULE | Freq: Every day | ORAL | Status: DC
Start: 2020-03-21 — End: 2020-05-18

## 2020-03-20 MED ORDER — METOPROLOL TARTRATE 25 MG PO TABS
25.0000 mg | ORAL_TABLET | Freq: Two times a day (BID) | ORAL | Status: DC
Start: 2020-03-20 — End: 2020-05-13

## 2020-03-20 MED ORDER — CEFEPIME IV (FOR PTA / DISCHARGE USE ONLY): 2.0000 g | Freq: Three times a day (TID) | INTRAVENOUS | 0 refills | Status: DC

## 2020-03-20 MED ORDER — CEFEPIME IV (FOR PTA / DISCHARGE USE ONLY): 2.0000 g | Freq: Three times a day (TID) | INTRAVENOUS | 0 refills | Status: AC

## 2020-03-20 NOTE — Progress Notes (Signed)
Subjective: 3 Days Post-Op Procedure(s) (LRB): CYSTOSCOPY/URETEROSCOPY/HOLMIUM LASER/STENT LEFT PLACEMENT (Bilateral) Patient reports pain as mild.    Objective: Vital signs in last 24 hours: Temp:  [98 F (36.7 C)-98.3 F (36.8 C)] 98.2 F (36.8 C) (12/09 2200) Pulse Rate:  [75-78] 77 (12/09 2200) Resp:  [15-20] 17 (12/09 2200) BP: (127-141)/(59-76) 141/76 (12/09 2200) SpO2:  [95 %-98 %] 98 % (12/09 2200)  Intake/Output from previous day: 12/09 0701 - 12/10 0700 In: 1227.5 [P.O.:840; Blood:357.5] Out: 3360 [Urine:3200; Drains:160] Intake/Output this shift: Total I/O In: 150 [P.O.:120; Other:30] Out: 1308 [Urine:1400; Drains:120]  Recent Labs    03/18/20 0350 03/19/20 0450 03/20/20 0300  HGB 9.2* 7.7* 9.0*   Recent Labs    03/19/20 0450 03/20/20 0300  WBC 7.0 7.6  RBC 2.60* 3.04*  HCT 24.5* 28.2*  PLT 310 352   Recent Labs    03/19/20 0450 03/20/20 0300  NA 139 138  K 4.6 4.7  CL 110 110  CO2 22 21*  BUN 21 20  CREATININE 1.25* 1.14  GLUCOSE 176* 178*  CALCIUM 7.6* 7.5*   No results for input(s): LABPT, INR in the last 72 hours.  Neurovascular intact Incision: dressing C/D/I No cellulitis present Compartment soft   Assessment/Plan: 3 Days Post-Op Procedure(s) (LRB): CYSTOSCOPY/URETEROSCOPY/HOLMIUM LASER/STENT LEFT PLACEMENT (Bilateral) Possible discharge home today Follow up in office in 2 weeks   Gaynelle Arabian 03/20/2020, 6:41 AM

## 2020-03-20 NOTE — Progress Notes (Signed)
Referring Physician(s): Aluisio,F/Patel,P  Supervising Physician: Aletta Edouard  Patient Status:  [facility] - In-pt  Chief Complaint:  Left hip/thigh hematoma/abscess  Subjective: Pt doing fairly well this am; only minimal left hip discomfort, occ itch in area   Allergies: Shellfish allergy  Medications: Prior to Admission medications   Medication Sig Start Date End Date Taking? Authorizing Provider  albuterol (VENTOLIN HFA) 108 (90 Base) MCG/ACT inhaler Inhale 2 puffs into the lungs every 6 (six) hours as needed for wheezing or shortness of breath. 08/09/19 03/03/20 Yes Luetta Nutting, DO  amLODipine (NORVASC) 10 MG tablet Take 10 mg by mouth daily.   Yes [provider]  Aspirin 81 MG CAPS Take 81 mg by mouth daily.    Yes [provider]  atorvastatin (LIPITOR) 40 MG tablet Take 1 tablet (40 mg total) by mouth daily. 06/26/16  Yes Tat, Shanon Brow, MD  carvedilol (COREG) 25 MG tablet Take 25 mg by mouth 2 (two) times daily with a meal.   Yes [provider]  co-enzyme Q-10 30 MG capsule Take 30 mg by mouth daily.   Yes [provider]  cycloSPORINE (RESTASIS) 0.05 % ophthalmic emulsion Place 1 drop into both eyes 2 (two) times daily.  09/23/19  Yes [provider]  diclofenac Sodium (VOLTAREN) 1 % GEL Apply 4 g topically 4 (four) times daily. 03/01/20  Yes Deno Etienne, DO  docusate sodium (COLACE) 100 MG capsule Take 100 mg by mouth daily as needed for mild constipation.   Yes [provider]  ferrous sulfate 325 (65 FE) MG tablet Take 325 mg by mouth daily with breakfast.   Yes [provider]  gabapentin (NEURONTIN) 300 MG capsule Take 1 capsule (300 mg total) by mouth 3 (three) times daily. Start 300mg  at bedtime x1 week then may increase to three times per day. Patient taking differently: Take 300 mg by mouth in the morning, at noon, and at bedtime.  10/21/19  Yes Luetta Nutting, DO  glimepiride (AMARYL) 4 MG tablet Take 4  mg by mouth daily before breakfast.   Yes [provider]  hydrochlorothiazide (HYDRODIURIL) 25 MG tablet Take 25 mg by mouth daily. 02/28/20  Yes [provider]  morphine (MSIR) 15 MG tablet Take 0.5 tablets (7.5 mg total) by mouth every 4 (four) hours as needed for severe pain. 03/01/20  Yes Deno Etienne, DO  predniSONE (DELTASONE) 50 MG tablet Take daily until resolution of gout symptoms. Patient taking differently: Take 50 mg by mouth daily. Take daily until resolution of gout symptoms. 02/21/20  Yes Luetta Nutting, DO  sitaGLIPtin (JANUVIA) 50 MG tablet Take 1 tablet (50 mg total) by mouth daily. 06/26/16  Yes Tat, Shanon Brow, MD  spironolactone (ALDACTONE) 25 MG tablet Take 25 mg by mouth daily. 02/28/20  Yes [provider]  Continuous Blood Gluc Receiver (FREESTYLE LIBRE 14 DAY READER) [place] See admin instructions. 05/23/19   [provider]  Continuous Blood Gluc Sensor (FREESTYLE LIBRE 14 DAY SENSOR) MISC 1 Device by Does not apply route every 14 (fourteen) days. 05/23/19   Luetta Nutting, DO  omeprazole (PRILOSEC) 40 MG capsule Take 1 capsule (40 mg total) by mouth 2 (two) times daily. X 1 month, then once daily Patient not taking: Reported on 03/03/2020 06/26/16   Orson Eva, MD     Vital Signs: BP (!) 145/69 (BP Location: Right Arm)   Pulse 76   Temp 98.2 F (36.8 C) (Oral)   Resp (!) 22   Ht  5\' 9"  (1.753 m)   Wt 235 lb 3.2 oz (106.7 kg)   SpO2 98%   BMI 34.73 kg/m   Physical Exam awake/alert; left hip/thigh drain intact, insertion site clean and dry, minimal tenderness; OP 160 cc yesterday, 30 cc today bloody fluid; drain flushed without difficulty  Imaging: IR US Guide Bx Asp/Drain  Result Date: 03/17/2020 INDICATION: CHRONIC LEFT THIGH HEMATOMA/ABSCESS EXAM: ULTRASOUND DRAINAGE LEFT ANTERIOR THIGH HEMATOMA/ABSCESS MEDICATIONS: The patient is currently admitted to the hospital and receiving intravenous antibiotics. The antibiotics were  administered within an appropriate time frame prior to the initiation of the procedure. ANESTHESIA/SEDATION: Fentanyl 100 mcg IV; Versed 2.0 mg IV Moderate Sedation Time:  13 minutes The patient was continuously monitored during the procedure by the interventional radiology nurse under my direct supervision. COMPLICATIONS: None. PROCEDURE: Informed written consent was obtained from the patient after a thorough discussion of the procedural risks, benefits and alternatives. All questions were addressed. Maximal Sterile Barrier Technique was utilized including caps, mask, sterile gowns, sterile gloves, sterile drape, hand hygiene and skin antiseptic. A timeout was performed prior to the initiation of the procedure. Previous imaging reviewed. Preliminary ultrasound performed. A complex fluid collection was localized in the anterior proximal thigh. Overlying skin marked. Under sterile conditions and local anesthesia, an 18 gauge introducer needle was advanced from an anterior approach into the complex fluid collection. Needle position confirmed with ultrasound. Images obtained for documentation. Guidewire inserted followed by tract dilatation insert a 14 French drain. Drain catheter position confirmed with ultrasound. Syringe aspiration yielded 150 cc dark bloody fluid compatible with old hematoma. Sample sent for culture. Catheter secured with a Prolene suture and connected to external suction bulb. Sterile dressing applied no immediate complication. Patient tolerated procedure well. IMPRESSION: Ultrasound-guided left anterior thigh hematoma/abscess drain (14 [place]). Electronically Signed   By: Jerilynn Mages.  Shick M.D.   On: 03/17/2020 11:59   DG C-Arm 1-60 Min-No Report  Result Date: 03/17/2020 Fluoroscopy was utilized by the requesting physician.  No radiographic interpretation.   VAS [place] LOWER EXTREMITY VENOUS (DVT)  Result Date: 03/16/2020  Lower Venous DVT Study Indications: Edema.  Comparison Study: no prior  Performing Technologist: Abram Sander RVS  Examination Guidelines: A complete evaluation includes B-mode imaging, spectral Doppler, color Doppler, and power Doppler as needed of all accessible portions of each vessel. Bilateral testing is considered an integral part of a complete examination. Limited examinations for reoccurring indications may be performed as noted. The reflux portion of the exam is performed with the patient in reverse Trendelenburg.  +-----+---------------+---------+-----------+----------+--------------+ RIGHTCompressibilityPhasicitySpontaneityPropertiesThrombus Aging +-----+---------------+---------+-----------+----------+--------------+ CFV  Full           Yes      Yes                                 +-----+---------------+---------+-----------+----------+--------------+   +---------+---------------+---------+-----------+----------+-------------------+ LEFT     CompressibilityPhasicitySpontaneityPropertiesThrombus Aging      +---------+---------------+---------+-----------+----------+-------------------+ CFV      Full           Yes      Yes                                      +---------+---------------+---------+-----------+----------+-------------------+ SFJ      Full                                                             +---------+---------------+---------+-----------+----------+-------------------+  FV Prox  Full                                                             +---------+---------------+---------+-----------+----------+-------------------+ FV Mid   Full                                                             +---------+---------------+---------+-----------+----------+-------------------+ FV DistalFull                                                             +---------+---------------+---------+-----------+----------+-------------------+ PFV      Full                                                              +---------+---------------+---------+-----------+----------+-------------------+ POP      Full           Yes      Yes                                      +---------+---------------+---------+-----------+----------+-------------------+ PTV      Full                                                             +---------+---------------+---------+-----------+----------+-------------------+ PERO                                                  Not well visualized +---------+---------------+---------+-----------+----------+-------------------+     Summary: RIGHT: - No evidence of common femoral vein obstruction.  LEFT: - There is no evidence of deep vein thrombosis in the lower extremity.  - No cystic structure found in the popliteal fossa.  *See table(s) above for measurements and observations. Electronically signed by Ruta Hinds MD on 03/16/2020 at 5:02:47 PM.    Final     Labs:  CBC: Recent Labs    03/17/20 0541 03/18/20 0350 03/19/20 0450 03/20/20 0300  WBC 6.5 5.4 7.0 7.6  HGB 8.0* 9.2* 7.7* 9.0*  HCT 24.8* 28.6* 24.5* 28.2*  PLT 271 349 310 352    COAGS: Recent Labs    03/04/20 1252  INR 1.1    BMP: Recent Labs    10/24/19 0807 12/06/19 0736 01/06/20 0751 01/13/20 0716 01/14/20 1027 03/17/20 0541 03/18/20 0350 03/19/20 0450 03/20/20 0300  NA 135 137 136 136   < >  138 138 139 138  K 4.8 5.4* 5.3 6.4*   < > 4.7 5.4* 4.6 4.7  CL 109 109 108 110   < > 110 110 110 110  CO2 18* 21 20 19*   < > 21* 18* 22 21*  GLUCOSE 145* 152* 107* 58*   < > 142* 287* 176* 178*  BUN 33* 43* 55* 52*   < > 22 21 21 20   CALCIUM 9.0 9.0 9.0 8.9   < > 7.6* 8.1* 7.6* 7.5*  CREATININE 1.62* 2.02* 3.11* 3.31*   < > 1.28* 1.27* 1.25* 1.14  GFRNONAA 41* 32* 19* 17*   < > 59* 59* >60 >60  GFRAA 48* 37* 22* 20*  --   --   --   --   --    < > = values in this interval not displayed.    LIVER FUNCTION TESTS: Recent Labs    03/09/20 0419 03/10/20 0633 03/12/20 0450  03/19/20 0450  BILITOT 0.6 0.9 0.5 0.4  AST 44* 76* 35 14*  ALT 33 67* 49* 18  ALKPHOS 94 97 83 56  PROT 7.1 6.9 6.0* 5.5*  ALBUMIN 2.3* 2.4* 2.3* 1.9*    Assessment and Plan: 74 y.o.maleHistory of CAD, DM, HTN, CKD, prostate cancer renal stones with hydronephrosis s/p ureteral stents OA s/p left hip arthroplasty in 2001. Admitted from the orthopedic care to the ED at [facility] on 11.23.21 with left hip pain, chills, nausea and diarrhea found to have septic arthritis with a fluid and air collection with the left adductor muscle s/p percutaneous abscess drain placed by IR on 11.24.21. the patient was taken to surgery on 11.29.21 for an I&D of the left hip with liner exchange. The IR drain was removed at that time. Due to ongoing pain and swelling of the left thighthe Team ordered a CT left hip from 12.6.21. CTreadsLarge complex fluid and air containing collection surrounding the proximal femoral component measuring up to 13 cm, compatible with infection/abscess; s/p new drain placement 12/7; afebrile; WBC nl; hgb 9.0(7.7), creat nl; drain fluid cx- rare enterobacter; as OP pt should have left hip drain irrigated with 5 cc sterile saline once daily, OP recorded and gauze dressing changed every 1-2 days; he will be scheduled for IR drain clinic f/u in 1-2 weeks; prescription for saline flushes and OP recording card given to pt   Electronically Signed: D. Rowe Robert, PA-C 03/20/2020, 9:26 AM   I spent a total of 15 minutes at the the patient's bedside AND on the patient's hospital floor or unit, greater than 50% of which was counseling/coordinating care for left hip/thigh abscess drain    Patient ID: [name], male   DOB: Apr 21, 1945, 74 y.o.   MRN: [id number]

## 2020-03-20 NOTE — Progress Notes (Signed)
Physical Therapy Treatment Patient Details Name: [name] MRN: [id number] DOB: [birth year] Today's Date: 03/20/2020    History of Present Illness Pt is 74 yo male admitted to ED from [facility] with L hip pain and fluid collection/septic arthritis. He is S/P I&D and bearing surface exchange 03/09/20 of L posterior THA. Pt found to have SBO vs ileus - surgery believes is ileus on 03/12/20 - per surgery mobilize as tolerated. Hx of R THA 2013, L THA 2001, DM, CAD, CKD, anemia. s/p Cystoscopy with right retrograde pyelogram, right diagnostic ureteroscopy, left ureteroscopy laser lithotripsy, right stent removal, left ureteral stent exchange on 12/7.    PT Comments    Pt progressing well.  Assisted OOB to amb in  Hallway required increased time and effort.   Pt will need ST Rehab at SNF prior to safely returning home.   Follow Up Recommendations  SNF     Equipment Recommendations  None recommended by PT    Recommendations for Other Services       Precautions / Restrictions Precautions Precautions: Fall;Posterior Hip Precaution Comments: S/p left hip drain placement 03/17/20 Restrictions Weight Bearing Restrictions: No Other Position/Activity Restrictions: WBAT    Mobility  Bed Mobility Overal bed mobility: Needs Assistance Bed Mobility: Supine to Sit           General bed mobility comments: min assist L LE and increased time  Transfers Overall transfer level: Needs assistance Equipment used: Rolling walker (2 wheeled) Transfers: Sit to/from Stand Sit to Stand: From elevated surface;Min guard Stand pivot transfers: Min guard       General transfer comment: Min guard with RW to stand and transfer to recliner.  Ambulation/Gait Ambulation/Gait assistance: Min assist Gait Distance (Feet): 85 Feet Assistive device: Rolling walker (2 wheeled) Gait Pattern/deviations: Step-to pattern;Trunk flexed;Decreased weight shift to left;Decreased stride length Gait  velocity: decreased   General Gait Details: cues for safe RW distance, and multiple cues for proper gait sequence. No LOB, stable with RW.   Stairs             Wheelchair Mobility    Modified Rankin (Stroke Patients Only)       Balance                                            Cognition Arousal/Alertness: Awake/alert Behavior During Therapy: WFL for tasks assessed/performed Overall Cognitive Status: Within Functional Limits for tasks assessed                                 General Comments: Very pleasant and friendly      Exercises      General Comments        Pertinent Vitals/Pain Pain Assessment: Faces Faces Pain Scale: Hurts a little bit Pain Location: L hip Pain Intervention(s): Monitored during session;Repositioned    Home Living                      Prior Function            PT Goals (current goals can now be found in the care plan section) Progress towards PT goals: Progressing toward goals    Frequency    Min 3X/week      PT Plan Current plan remains appropriate    Co-evaluation  AM-PAC PT "6 Clicks" Mobility   Outcome Measure  Help needed turning from your back to your side while in a flat bed without using bedrails?: A Little Help needed moving from lying on your back to sitting on the side of a flat bed without using bedrails?: A Little Help needed moving to and from a bed to a chair (including a wheelchair)?: A Little Help needed standing up from a chair using your arms (e.g., wheelchair or bedside chair)?: A Little Help needed to walk in hospital room?: A Little Help needed climbing 3-5 steps with a railing? : A Lot 6 Click Score: 17    End of Session Equipment Utilized During Treatment: Gait belt Activity Tolerance: Patient tolerated treatment well Patient left: with call bell/phone within reach;in chair;with chair alarm set Nurse Communication: Mobility  status PT Visit Diagnosis: Muscle weakness (generalized) (M62.81);Other abnormalities of gait and mobility (R26.89);Pain Pain - Right/Left: Left     Time: 8921-1941 PT Time Calculation (min) (ACUTE ONLY): 31 min  Charges:  $Gait Training: 8-22 mins $Therapeutic Activity: 8-22 mins                     Rica Koyanagi  PTA Acute  Rehabilitation Services Pager      [phone] Office      [phone]

## 2020-03-20 NOTE — Discharge Instructions (Signed)
FLUSH LEFT HIP/THIGH DRAIN WITH 5 CC STERILE SALINE ONCE DAILY; RECORD OUTPUT OF DRAIN ON ORANGE CARD; CHANGE GAUZE DRESSING OVER DRAIN SITE EVERY 1-2 DAYS; CALL [phone] WITH ANY DRAIN RELATED QUESTIONS

## 2020-03-20 NOTE — Discharge Summary (Addendum)
Physician Discharge Summary  [name] WIO:[id number] DOB: May 25, 1945 DOA: 03/03/2020  PCP: Luetta Nutting, DO  Admit date: 03/03/2020 Discharge date: 03/20/2020  Admitted From: home Disposition:  SNF  Recommendations for Outpatient Follow-up:  1. Follow up with PCP in 1-2 weeks 2. Follow-up with interventional radiology in 7 to 10 days for drain check and repeat imaging 3. Follow-up with cardiology in 1 to 2 weeks 4. Follow-up with orthopedic surgery in 1 to 2 weeks 5. Follow-up with urology in 1 to 2 weeks  Home Health: none Equipment/Devices: none  Discharge Condition: stable CODE STATUS: Full code Diet recommendation: heart healthy  HPI: Per admitting MD, [name] is a 74 y.o. male with medical history significant of coronary artery disease, diabetes type 2, hypertension, anemia, CKD stage IIIb, osteoarthritis status post left hip arthroplasty, renal stone/hydronephrosis status post stents, prostate cancer ,paroxysmal atrial flutter not on anticoagulation who was sent for direct admission to [facility] from [facility],Dr Aluisio.  Patient was seen in the emergency department on 03/01/2020 for left hip pain.  There was no report of injury.  He had CT left hip done which showed  fluid collection.  He was found to have elevated ESR and CRP.  He was discharged from the emergency department to follow-up with orthopedics as per orthopedics recommendation.  In the orthopedics office, he reported chills, nausea/diarrhea and increased left hip pain.  He was hardly able to walk.  He was afebrile.  Patient was requested for direct admission by orthopedics team for drainage or intervention of the left hip fluid collection.  Lab work also showed elevated ESR and CRP. Patient seen and examined at the bedside this evening.  During my evaluation, he was hemodynamically stable and overall comfortable.  He was hypertensive.  He complained of severe pain on his  left hip and the left ear was found to be tender.  He denied any fever, chest pain, shortness of breath, cough, abdomen pain, nausea, vomiting, dysuria, hematochezia or melena.  He ives alone  Hospital Course / Discharge diagnoses: Principal Problem Left hip septic arthritis/prosthetic joint infection/abscess, Enterobacter bacteremia -Orthopedic surgery consulted and followed patient while hospitalized.  He was taken to the OR on 11/29 status post I&D of the left hip with liner exchange for periprosthetic joint infection.  Cultures grew Enterobacter cloacae.  ID was consulted and recommended several weeks of antibiotics up until April 21, 2019, followed by oral antibiotics.  It is believed that the source may be urinary in origin as his urine cultures also grew same bacteria.  Following clearance of surveillance blood cultures patient eventually underwent a tunneled CVC on 11/30  Active Problems Nephrolithiasis with history of left-sided hydronephrosis -Patient with history of nephrolithiasis requiring urgent bilateral stents with Dr. Tresa Moore in October 2021 for acute renal failure with a 7 mm left distal stone and small bilateral LP stones.  He was seen as an outpatient by urology on 11/15 and was doing well, and plans were in place for patient to have bilateral ureteroscopy, laser lithotripsy and stent exchange but meanwhile he was admitted with bacteremia and left hip infection.  Urology consulted while here and was eventually taken to the OR on 12/7 by Dr. Junious Silk and underwent cystoscopy with right retrograde pyelogram, right diagnostic ureteroscopy, left ureteroscopy laser lithotripsy, right stent removal and left ureteral stent exchange.  Foley discontinued 12/10.  He will need follow-up with urology as an outpatient Paroxysmal A. fib/flutter with RVR -Primary cardiologist is  Dr. [name], he was not anticoagulated prior to admission but was anticoagulated here.  Due to development of left thigh  hematoma with anticoagulation is now on hold. Continue amiodarone, diltiazem Acute kidney injury on chronic kidney disease stage IIIb -Baseline creatinine 1.2-1.4, as high as 2.5 during this admission.   Creatinine returned to baseline Hypomagnesemia -Repleted Left thigh hematoma, acute blood loss anemia -Patient with increased pain in the left hip as well as swelling, underwent a CT scan on 12/6 which showed a large complex fluid in the containing collection surrounding the proximal femur component measuring up to 13 cm concerning for abscess.  IR was consulted and patient underwent ultrasound-guided left anterior thigh drainage and it appears to be a hematoma.  Received total of 3 units of packed red blood cells with this hematoma, discussed with cardiology, will hold anticoagulation on discharge and this can be reevaluated as an outpatient once drain is removed and hematoma resolves.  He will also need follow-up with interventional radiology as an outpatient for drain management  Left leg edema -Negative ultrasound Doppler on 12/6 Obesity -Based on BMI of 34, he would benefit from weight loss Ileus, possible partial SBO -Patient had an episode of abdominal distention, required NG tube placement at 1 point.  General surgery followed.  This is resolved and currently able to tolerate a regular diet, having bowel movements. Coronary artery disease -On aspirin DM2 -Most recent A1c 7.1  Discharge Instructions  Discharge Instructions    [facility] pharmacist to adjust dose for Vancomycin, Aminoglycosides and other anti-infective therapies as requested by physician.   Complete by: As directed    [facility] to provide Cath Flo 54m   Complete by: As directed    Administer for PICC line occlusion and as ordered by physician for other access device issues.   Anaphylaxis Kit: Provided to treat any anaphylactic reaction to the medication being provided to the patient if First Dose or  when requested by physician   Complete by: As directed    Epinephrine 128mml vial / amp: Administer 0.68m468m0.68ml70mubcutaneously once for moderate to severe anaphylaxis, nurse to call physician and pharmacy when reaction occurs and call 911 if needed for immediate care   Diphenhydramine 50mg71mIV vial: Administer 25-50mg 768mM PRN for first dose reaction, rash, itching, mild reaction, nurse to call physician and pharmacy when reaction occurs   Sodium Chloride 0.9% NS 500ml I87mdminister if needed for hypovolemic blood pressure drop or as ordered by physician after call to physician with anaphylactic reaction   Change dressing on IV access line weekly and PRN   Complete by: As directed    Flush IV access with Sodium Chloride 0.9% and Heparin 10 units/ml or 100 units/ml   Complete by: As directed    Home infusion instructions - [facility]   Complete by: As directed    Instructions: Flush IV access with Sodium Chloride 0.9% and Heparin 10units/ml or 100units/ml   Change dressing on IV access line: Weekly and PRN   Instructions Cath Flo 2mg: Ad68mister for PICC Line occlusion and as ordered by physician for other access device   [facility] pharmacist to adjust dose for: Vancomycin, Aminoglycosides and other anti-infective therapies as requested by physician   Method of administration may be changed at the discretion of home infusion pharmacist based upon assessment of the patient and/or caregiver's ability to self-administer the medication ordered   Complete by: As directed      Allergies  as of 03/20/2020      Reactions   Shellfish Allergy Rash      Medication List    STOP taking these medications   amLODipine 10 MG tablet Commonly known as: NORVASC   carvedilol 25 MG tablet Commonly known as: COREG   hydrochlorothiazide 25 MG tablet Commonly known as: HYDRODIURIL   morphine 15 MG tablet Commonly known as: MSIR   predniSONE 50 MG tablet Commonly known as:  DELTASONE     TAKE these medications   albuterol 108 (90 Base) MCG/ACT inhaler Commonly known as: VENTOLIN HFA Inhale 2 puffs into the lungs every 6 (six) hours as needed for wheezing or shortness of breath.   amiodarone 200 MG tablet Commonly known as: PACERONE Take 1 tablet (200 mg total) by mouth daily. Start taking on: March 21, 2020   Aspirin 81 MG Caps Take 81 mg by mouth daily.   atorvastatin 40 MG tablet Commonly known as: LIPITOR Take 1 tablet (40 mg total) by mouth daily.   ceFEPime  IVPB Commonly known as: MAXIPIME Inject 2 g into the vein every 8 (eight) hours. Indication:  Prosthetic joint infection of left hip First Dose: Yes Last Day of Therapy:  04/20/2020 Labs - Once weekly:  CBC/D and BMP, Labs - Every other week:  ESR and CRP Method of administration: IV Push Method of administration may be changed at the discretion of home infusion pharmacist based upon assessment of the patient and/or caregiver's ability to self-administer the medication ordered.   co-enzyme Q-10 30 MG capsule Take 30 mg by mouth daily.   cycloSPORINE 0.05 % ophthalmic emulsion Commonly known as: RESTASIS Place 1 drop into both eyes 2 (two) times daily.   diclofenac Sodium 1 % Gel Commonly known as: VOLTAREN Apply 4 g topically 4 (four) times daily.   diltiazem 120 MG 24 hr capsule Commonly known as: CARDIZEM CD Take 1 capsule (120 mg total) by mouth daily. Start taking on: March 21, 2020   docusate sodium 100 MG capsule Commonly known as: COLACE Take 100 mg by mouth daily as needed for mild constipation.   ferrous sulfate 325 (65 FE) MG tablet Take 325 mg by mouth daily with breakfast.   FreeStyle Libre 14 Day Reader Kerrin Mo See admin instructions.   FreeStyle Libre 14 Day Sensor Misc 1 Device by Does not apply route every 14 (fourteen) days.   gabapentin 300 MG capsule Commonly known as: NEURONTIN Take 1 capsule (300 mg total) by mouth 3 (three) times daily. Start  332m at bedtime x1 week then may increase to three times per day. What changed:   when to take this  additional instructions   glimepiride 4 MG tablet Commonly known as: AMARYL Take 4 mg by mouth daily before breakfast.   metoprolol tartrate 25 MG tablet Commonly known as: LOPRESSOR Take 1 tablet (25 mg total) by mouth 2 (two) times daily.   omeprazole 40 MG capsule Commonly known as: PriLOSEC Take 1 capsule (40 mg total) by mouth 2 (two) times daily. X 1 month, then once daily   oxyCODONE-acetaminophen 5-325 MG tablet Commonly known as: PERCOCET/ROXICET Take 1 tablet by mouth every 4 (four) hours as needed for moderate pain.   sitaGLIPtin 50 MG tablet Commonly known as: JANUVIA Take 1 tablet (50 mg total) by mouth daily.   spironolactone 25 MG tablet Commonly known as: ALDACTONE Take 25 mg by mouth daily.            Discharge Care Instructions  (From  admission, onward)         Start     Ordered   03/20/20 0000  Change dressing on IV access line weekly and PRN  (Home infusion instructions - [facility] )        03/20/20 1126          Contact information for follow-up providers    Festus Aloe, MD Follow up on 04/29/2020.   Specialty: Urology Why: at 10:15 AM with Dr. Jarvis Morgan information: [place] [id number] [phone]        Gaynelle Arabian, MD. Call in 2 weeks.   Specialty: Orthopedic Surgery Why: Call [phone] Monday to make an appointment with Dr Wynelle Link on 12/21 or 12/23 Contact information: [address] [place] [address] [phone]            Contact information for after-discharge care    [facility] .   Service: Skilled Nursing Contact information: [address] [place] [phone]                  Consultations:  General surgery   Orthopedic surgery  Infectious  disease  Cardiology  Interventional radiology  Procedures/Studies:  CT ABDOMEN PELVIS WO CONTRAST  Result Date: 03/04/2020 CLINICAL DATA:  Nausea vomiting. EXAM: CT ABDOMEN AND PELVIS WITHOUT CONTRAST TECHNIQUE: Multidetector CT imaging of the abdomen and pelvis was performed following the standard protocol without IV contrast. COMPARISON:  CT abdomen pelvis 01/14/2020 FINDINGS: Lines and tubes: Enteric tube noted with tip within the gastric lumen and side port at the gastroesophageal junction. Bilateral ureteral stents with proximal pigtails in a superior pole calyx and distal pigtails not visualized due to streak artifact originating from bilateral femoral surgical hardware. Distal pigtails of the ureteral stents terminate in the expected region of the urinary bladder. Lower chest: Coronary artery calcifications. Trace pericardial effusion. No acute abnormality. Hepatobiliary: No focal liver abnormality. No gallstones, gallbladder wall thickening, or pericholecystic fluid. No biliary dilatation. Pancreas: No focal lesion. Normal pancreatic contour. No surrounding inflammatory changes. No main pancreatic ductal dilatation. Spleen: Normal in size. There is a 1.6 cm fluid density lesion within the spleen likely represents a simple cyst. Otherwise no focal abnormality. Adrenals/Urinary Tract: No adrenal nodule bilaterally. Couple of tiny foci of gas noted within the collecting systems bilaterally. No nephrolithiasis, no hydronephrosis, and no contour-deforming renal mass. No ureterolithiasis or hydroureter. The urinary bladder is unremarkable. Stomach/Bowel: Stomach is within normal limits. The proximal mid small bowel is mildly distended with fluid of to 3.2 cm in caliber. No associated pneumatosis. There is suggestion of a possible transition point within the anterior abdomen at the level of the umbilicus. The distal small bowel is collapsed. No evidence of large bowel wall thickening or dilatation.  Appendix appears normal. Vascular/Lymphatic: No abdominal aorta or iliac aneurysm. Severe atherosclerotic plaque of the aorta and its branches. No abdominal, pelvic, or inguinal lymphadenopathy. Reproductive: Not visualized due to streak artifact originating from bilateral its femoral surgical hardware. Other: No intraperitoneal free fluid. No intraperitoneal free gas. No organized fluid collection. Musculoskeletal: Tiny Richter hernia containing the anti mesenteric wall of a very short segment of small bowel (where the possible transition point is identified). No suspicious lytic or blastic osseous lesions. No acute displaced fracture. Multilevel degenerative changes of the spine. Partially visualized bilateral total hip arthroplasties. IMPRESSION: 1. Tiny Richter hernia containing a very short loop of small bowel  with an associated early/partial small bowel obstruction. 2. Couple of tiny foci of gas noted within the collecting systems bilaterally. Findings could be related to recent ureteral stent placement versus could represent emphysematous pyelitis. Recommend correlation with urinalysis for urinary tract infection. 3. Enteric tube with tip within the gastric lumen and side port at the gastroesophageal junction. Consider advancing by 3-5cm. 4.  Aortic Atherosclerosis (ICD10-I70.0). Electronically Signed   By: Iven Finn M.D.   On: 03/04/2020 21:09   DG Abd 1 View  Result Date: 03/13/2020 CLINICAL DATA:  Ileus. EXAM: ABDOMEN - 1 VIEW COMPARISON:  03/11/2020.  CT 03/04/2020. FINDINGS: Bilateral ureteral stents again noted in stable position. Persistent small-bowel distention without interim change. Colon is nondistended. No free air. Degenerative change lumbar spine. Surgical clips noted the pelvis. Bilateral hip replacements. IMPRESSION: 1.  Bilateral ureteral stents again noted in stable position. 2. Persistent small-bowel distention consistent with small bowel obstruction. No interim change. Colon is  nondistended. No free air. Electronically Signed   By: Marcello Moores  Register   On: 03/13/2020 06:51   DG Abd 1 View  Result Date: 03/11/2020 CLINICAL DATA:  Postoperative abdominal distension. EXAM: ABDOMEN - 1 VIEW COMPARISON:  March 10, 2020. FINDINGS: Stable position bilateral ureteral stents. No abnormal calcifications are noted. Slightly improved small bowel dilatation is noted consistent with ileus or possibly small bowel obstruction. No colonic dilatation is noted. IMPRESSION: Slightly improved small bowel dilatation is noted consistent with ileus or possibly small bowel obstruction. Stable position bilateral ureteral stents. Electronically Signed   By: Marijo Conception M.D.   On: 03/11/2020 08:31   DG Abd 1 View  Result Date: 03/10/2020 CLINICAL DATA:  Abdominal distension EXAM: ABDOMEN - 1 VIEW COMPARISON:  03/06/2020 FINDINGS: Two supine frontal views of the abdomen and pelvis are obtained. Enteric catheter is seen previously has been removed in the interim. There is diffuse gaseous distention of the small bowel, with increase in the size and number of distended loops since prior study. Minimal colonic gas is visualized. No masses or abnormal calcifications. Bilateral ureteral stents are noted. IMPRESSION: 1. Progressive gaseous distention of the small bowel, consistent with worsening obstruction or ileus. Electronically Signed   By: Randa Ngo M.D.   On: 03/10/2020 15:07   DG Abd 1 View  Result Date: 03/04/2020 CLINICAL DATA:  NG tube placement EXAM: ABDOMEN - 1 VIEW COMPARISON:  03/04/2020 FINDINGS: Esophageal tube tip projects over the mid gastric region. Dilated loops of small bowel suspicious for obstruction. Partially visualized ureteral stents IMPRESSION: Esophageal tube tip overlies the mid stomach. Electronically Signed   By: Donavan Foil M.D.   On: 03/04/2020 22:46   DG Abd 1 View  Result Date: 03/04/2020 CLINICAL DATA:  NG tube placement EXAM: ABDOMEN - 1 VIEW COMPARISON:   03/03/2020 FINDINGS: An enteric tube is present with tip in the left upper quadrant consistent with location in the upper stomach although the proximal side hole projects over the distal esophagus. Mild gaseous distention of the stomach. Gaseous distention of small bowel is demonstrated in the upper abdomen. IMPRESSION: Enteric tube tip projects over the upper stomach with proximal side hole over the distal esophagus. Electronically Signed   By: Lucienne Capers M.D.   On: 03/04/2020 02:02   DG Abd 1 View  Result Date: 03/03/2020 CLINICAL DATA:  Abdominal distension EXAM: ABDOMEN - 1 VIEW COMPARISON:  02/24/2020 FINDINGS: Diffusely gas distended bowel, including a loop of dilated small bowel in the left mid abdomen. Bilateral nephroureteral  stents. IMPRESSION: Diffusely gas distended bowel, including a loop of dilated small bowel in the left mid abdomen. Electronically Signed   By: Ulyses Jarred M.D.   On: 03/03/2020 23:21   CT HIP LEFT WO CONTRAST  Result Date: 03/16/2020 CLINICAL DATA:  Clinical concern for septic arthritis EXAM: CT OF THE LEFT HIP WITHOUT CONTRAST TECHNIQUE: Multidetector CT imaging of the left hip was performed according to the standard protocol. Multiplanar CT image reconstructions were also generated. COMPARISON:  03/01/2020 FINDINGS: Bones/Joint/Cartilage Status post left total hip arthroplasty. Arthroplasty components are in their expected alignment. No evidence of acute periprosthetic fracture. Similar periprosthetic lucency along the posterior cortex of the proximal femoral stem measuring approximately 7.2 cm in length (series 8, image 59), similar to prior. Similar degree of periprosthetic lucency surrounding the anterolateral aspect of the proximal femur (series 3, image 89). No air is seen within bone, as was evident on the previous study. Large complex fluid and air containing collection surrounding the proximal femoral component measuring approximately 13 x 7.5 x 10 cm  (series 4, image 85; series 9, image 76). There is air surrounding the femoral component at the level of the femoral neck. Again seen is extension of ill-defined fluid and air within the left adductor muscle compartment (series 4, image 95). New fluid and air containing collection is now seen within the anterior compartment musculature of the proximal to mid left thigh, which appears centered in the rectus femoris muscle. This collection measures approximately 14.0 x 3.1 x 8.3 cm (series 10, image 65; series 4, image 139). Ligaments Suboptimally assessed by CT. Muscles and Tendons Intramuscular fluid collection within the rectus femoris muscle, as above. Ill-defined adductor compartment collection, as above. Soft tissues Circumferential subcutaneous edema most pronounced at the anterolateral aspect of the proximal thigh. IMPRESSION: 1. Status post left total hip arthroplasty with similar degree of periprosthetic lucency surrounding the proximal femoral component concerning for septic loosening. 2. Large complex fluid and air containing collection surrounding the proximal femoral component measuring up to 13 cm, compatible with infection/abscess. 3. New large fluid and air containing collection within the anterior compartment musculature of the proximal to mid left thigh, which appears centered in the rectus femoris muscle. Collection measures up to 14 cm in length. Findings are compatible with an intramuscular abscess. Electronically Signed   By: Davina Poke D.O.   On: 03/16/2020 09:56   CT HIP LEFT WO CONTRAST  Result Date: 03/01/2020 CLINICAL DATA:  Left hip pain. EXAM: CT OF THE LEFT HIP WITHOUT CONTRAST TECHNIQUE: Multidetector CT imaging of the left hip was performed according to the standard protocol. Multiplanar CT image reconstructions were also generated. COMPARISON:  X-ray 03/01/2020 FINDINGS: Bones/Joint/Cartilage Status post left total hip arthroplasty. Arthroplasty components are in their  expected alignment. No evidence of periprosthetic fracture. Large fluid and air containing collection surrounding the proximal femoral component measuring approximately 8.7 cm in AP dimension (series 4, image 61) by 6.7 cm in craniocaudal dimension (series 9, image 94). Largest fluid component is along the lateral aspect of the proximal femur. Air and fluid track medially within the adductor muscle compartment extending to the level of the left pubic tubercle (series 4, images 74-79). Possible erosion along the anterior aspect of the proximal native femur with air seen within bone (series 3, image 68). Periprosthetic lucency along the posterior cortex of the proximal femoral stem extending distally by approximately 7.5 cm (series 8, image 59). Ligaments Suboptimally assessed by CT. Muscles and Tendons Poorly defined fluid  and air collection within the left adductor muscular compartment. Soft tissues Remaining soft tissues within normal limits. IMPRESSION: 1. Status post left total hip arthroplasty. Large fluid and air containing collection surrounding the proximal femoral component measuring up to 8.7 cm, concerning for infection/abscess. Fluid and air collection track within the left adductor muscle compartment extending towards the symphysis. Orthopedic consultation and arthrocentesis recommended. 2. Periprosthetic lucency along the posterior cortex of the proximal femoral stem extending distally by approximately 7.5 cm, concerning for septic loosening. 3. Additional area of lucency along the anterior aspect of the proximal native femur with air seen within bone, concerning for osteomyelitis. Electronically Signed   By: Davina Poke D.O.   On: 03/01/2020 11:10   IR Fluoro Guide CV Line Right  Result Date: 03/10/2020 INDICATION: 74 year old male referred for tunneled central venous catheter EXAM: IMAGE GUIDED TUNNELED CENTRAL VENOUS CATHETER PLACEMENT MEDICATIONS: None. ANESTHESIA/SEDATION: None  FLUOROSCOPY TIME:  Fluoroscopy Time: 0 minutes 6 seconds (3 mGy). COMPLICATIONS: None PROCEDURE: After written informed consent was obtained, patient was placed in the supine position on angiographic table. Patency of the right internal jugular vein was confirmed with ultrasound with image documentation. Patient was prepped and draped in the usual sterile fashion including the right neck and right superior chest. Using ultrasound guidance, the skin and subcutaneous tissues overlying the right internal jugular vein were generously infiltrated with 1% lidocaine without epinephrine. Using ultrasound guidance, the right internal jugular vein was punctured with a micropuncture needle, and an 018 wire was advanced into the right heart confirming venous access. A small stab incision was made with an 11 blade scalpel. Peel-away sheath was placed over the wire, and then the wire was removed, marking the wire for estimation of internal catheter length. The chest wall was then generously infiltrated with 1% lidocaine for local anesthesia along the tissue tract. Small stab incision was made with 11 blade scalpel, and then the catheter was back tunneled to the puncture site at the right internal jugular vein. Double-lumen catheter was pulled through the tract, with the catheter amputated at 22 cm. Catheter was advanced through the peel-away sheath, and the peel-away sheath was removed. Final image was stored. The catheter was anchored to the chest wall with 2 retention sutures, and Derma bond was used to seal the right internal jugular vein incision site and at the right chest wall. Patient tolerated the procedure well and remained hemodynamically stable throughout. No complications were encountered and no significant blood loss was encountered. FINDINGS: Tip at the cavoatrial junction IMPRESSION: Status post image guided placement of tunneled right IJ cuffed central venous catheter. Signed, Dulcy Fanny. Dellia Nims, RPVI Vascular  and Interventional Radiology Specialists [facility] Radiology Electronically Signed   By: Corrie Mckusick D.O.   On: 03/10/2020 17:28   IR US Guide Vasc Access Right  Result Date: 03/10/2020 INDICATION: 74 year old male referred for tunneled central venous catheter EXAM: IMAGE GUIDED TUNNELED CENTRAL VENOUS CATHETER PLACEMENT MEDICATIONS: None. ANESTHESIA/SEDATION: None FLUOROSCOPY TIME:  Fluoroscopy Time: 0 minutes 6 seconds (3 mGy). COMPLICATIONS: None PROCEDURE: After written informed consent was obtained, patient was placed in the supine position on angiographic table. Patency of the right internal jugular vein was confirmed with ultrasound with image documentation. Patient was prepped and draped in the usual sterile fashion including the right neck and right superior chest. Using ultrasound guidance, the skin and subcutaneous tissues overlying the right internal jugular vein were generously infiltrated with 1% lidocaine without epinephrine. Using ultrasound guidance, the right internal jugular vein  was punctured with a micropuncture needle, and an 018 wire was advanced into the right heart confirming venous access. A small stab incision was made with an 11 blade scalpel. Peel-away sheath was placed over the wire, and then the wire was removed, marking the wire for estimation of internal catheter length. The chest wall was then generously infiltrated with 1% lidocaine for local anesthesia along the tissue tract. Small stab incision was made with 11 blade scalpel, and then the catheter was back tunneled to the puncture site at the right internal jugular vein. Double-lumen catheter was pulled through the tract, with the catheter amputated at 22 cm. Catheter was advanced through the peel-away sheath, and the peel-away sheath was removed. Final image was stored. The catheter was anchored to the chest wall with 2 retention sutures, and Derma bond was used to seal the right internal jugular vein incision site and  at the right chest wall. Patient tolerated the procedure well and remained hemodynamically stable throughout. No complications were encountered and no significant blood loss was encountered. FINDINGS: Tip at the cavoatrial junction IMPRESSION: Status post image guided placement of tunneled right IJ cuffed central venous catheter. Signed, Dulcy Fanny. Dellia Nims, RPVI Vascular and Interventional Radiology Specialists [facility] Radiology Electronically Signed   By: Corrie Mckusick D.O.   On: 03/10/2020 17:28   IR US Guide Bx Asp/Drain  Result Date: 03/17/2020 INDICATION: CHRONIC LEFT THIGH HEMATOMA/ABSCESS EXAM: ULTRASOUND DRAINAGE LEFT ANTERIOR THIGH HEMATOMA/ABSCESS MEDICATIONS: The patient is currently admitted to the hospital and receiving intravenous antibiotics. The antibiotics were administered within an appropriate time frame prior to the initiation of the procedure. ANESTHESIA/SEDATION: Fentanyl 100 mcg IV; Versed 2.0 mg IV Moderate Sedation Time:  13 minutes The patient was continuously monitored during the procedure by the interventional radiology nurse under my direct supervision. COMPLICATIONS: None. PROCEDURE: Informed written consent was obtained from the patient after a thorough discussion of the procedural risks, benefits and alternatives. All questions were addressed. Maximal Sterile Barrier Technique was utilized including caps, mask, sterile gowns, sterile gloves, sterile drape, hand hygiene and skin antiseptic. A timeout was performed prior to the initiation of the procedure. Previous imaging reviewed. Preliminary ultrasound performed. A complex fluid collection was localized in the anterior proximal thigh. Overlying skin marked. Under sterile conditions and local anesthesia, an 18 gauge introducer needle was advanced from an anterior approach into the complex fluid collection. Needle position confirmed with ultrasound. Images obtained for documentation. Guidewire inserted followed by tract  dilatation insert a 14 French drain. Drain catheter position confirmed with ultrasound. Syringe aspiration yielded 150 cc dark bloody fluid compatible with old hematoma. Sample sent for culture. Catheter secured with a Prolene suture and connected to external suction bulb. Sterile dressing applied no immediate complication. Patient tolerated procedure well. IMPRESSION: Ultrasound-guided left anterior thigh hematoma/abscess drain (14 [place]). Electronically Signed   By: Jerilynn Mages.  Shick M.D.   On: 03/17/2020 11:59   DG CHEST PORT 1 VIEW  Result Date: 03/08/2020 CLINICAL DATA:  Preoperative assessment. EXAM: PORTABLE CHEST 1 VIEW COMPARISON:  Chest radiograph dated 01/26/2018. FINDINGS: The heart is enlarged. Both lungs are clear. Degenerative changes are seen in the spine. IMPRESSION: Cardiomegaly without acute cardiopulmonary process. Electronically Signed   By: Zerita Boers M.D.   On: 03/08/2020 17:34   DG Abd Portable 1V  Result Date: 03/06/2020 CLINICAL DATA:  Small-bowel obstruction. EXAM: PORTABLE ABDOMEN - 1 VIEW COMPARISON:  03/05/2020 abdominal radiographs and prior. FINDINGS: Indwelling enteric tube with tip and side hole overlying the  gastric body. Increased conspicuity of gas-filled mildly dilated small bowel loops. Residual enteric contrast opacifies nondilated ascending colon. Bilateral double-J ureteral stents are unchanged. Sequela of bilateral hip arthroplasties. Partially imaged left hip surgical drain. IMPRESSION: Mildly dilated small bowel loops, more conspicuous than prior exam. Indwelling enteric tube and bilateral ureteral stents, unchanged. Electronically Signed   By: Primitivo Gauze M.D.   On: 03/06/2020 10:02   DG Abd Portable 1V-Small Bowel Obstruction Protocol-initial, 8 hr delay  Result Date: 03/05/2020 CLINICAL DATA:  SBO EXAM: PORTABLE ABDOMEN - 1 VIEW COMPARISON:  March 04, 2020 FINDINGS: Persistent gaseous dilation of loops of small bowel. Small bowel loop measures up  to 5.2 cm, similar in comparison to prior. There is enteric contrast within a dilated loop of proximal small bowel. Enteric tube is coiled within the stomach with tip terminating over the distal stomach. Bilateral double-J stents. LEFT-sided surgical drain overlying the LEFT lateral leg. Soft tissue air of the LEFT lateral leg. Status post bilateral hip arthroplasty. Degenerative changes of the lower lumbar spine. IMPRESSION: Persistent gaseous dilation of loops of small bowel, similar in comparison to prior. Enteric contrast is within the dilated proximal small bowel. Electronically Signed   By: Valentino Saxon MD   On: 03/05/2020 11:12   DG C-Arm 1-60 Min-No Report  Result Date: 03/17/2020 Fluoroscopy was utilized by the requesting physician.  No radiographic interpretation.   ECHOCARDIOGRAM COMPLETE  Result Date: 03/06/2020    ECHOCARDIOGRAM REPORT   Patient Name:   BUEL MOLDER Date of Exam: 03/06/2020 Medical Rec #:  [id number]        Height:       74.0 in Accession #:    [id number]       Weight:       243.2 lb Date of Birth:  Aug 12, 1945        BSA:          2.362 m Patient Age:    61 years         BP:           171/78 mmHg Patient Gender: M                HR:           86 bpm. Exam Location:  Inpatient Procedure: 2D Echo, Cardiac Doppler and Color Doppler Indications:    I48.91* Unspeicified atrial fibrillation  History:        Patient has prior history of Echocardiogram examinations, most                 recent 06/24/2016. Previous Myocardial Infarction and CAD,                 Abnormal ECG, Arrythmias:Atrial Fibrillation,                 Signs/Symptoms:Chest Pain; Risk Factors:Diabetes, Former Smoker,                 Hypertension and Dyslipidemia. Cancer.  Sonographer:    Roseanna Rainbow RDCS Referring Phys: [phone] A CALDWELL POWELL JR  Sonographer Comments: Suboptimal parasternal window and suboptimal subcostal window. IMPRESSIONS  1. Left ventricular ejection fraction, by estimation, is 55 to 60%.  The left ventricle has normal function. The left ventricle has no regional wall motion abnormalities. There is mild concentric left ventricular hypertrophy. Left ventricular diastolic function could not be evaluated.  2. Right ventricular systolic function is normal. The right ventricular size is normal.  3. Left atrial size was  mildly dilated.  4. The pericardial effusion is circumferential.  5. The mitral valve is normal in structure. Mild mitral valve regurgitation. No evidence of mitral stenosis.  6. The aortic valve is normal in structure. Aortic valve regurgitation is not visualized. Mild aortic valve sclerosis is present, with no evidence of aortic valve stenosis.  7. The inferior vena cava is normal in size with greater than 50% respiratory variability, suggesting right atrial pressure of 3 mmHg. Comparison(s): No significant change from prior study. Prior images reviewed side by side. FINDINGS  Left Ventricle: Left ventricular ejection fraction, by estimation, is 55 to 60%. The left ventricle has normal function. The left ventricle has no regional wall motion abnormalities. The left ventricular internal cavity size was normal in size. There is  mild concentric left ventricular hypertrophy. Left ventricular diastolic function could not be evaluated due to atrial fibrillation. Left ventricular diastolic function could not be evaluated. Right Ventricle: The right ventricular size is normal. No increase in right ventricular wall thickness. Right ventricular systolic function is normal. Left Atrium: Left atrial size was mildly dilated. Right Atrium: Right atrial size was normal in size. Pericardium: Trivial pericardial effusion is present. The pericardial effusion is circumferential. Mitral Valve: The mitral valve is normal in structure. Mild mitral annular calcification. Mild mitral valve regurgitation, with centrally-directed jet. No evidence of mitral valve stenosis. Tricuspid Valve: The tricuspid valve is  normal in structure. Tricuspid valve regurgitation is not demonstrated. No evidence of tricuspid stenosis. Aortic Valve: The aortic valve is normal in structure. Aortic valve regurgitation is not visualized. Mild aortic valve sclerosis is present, with no evidence of aortic valve stenosis. Pulmonic Valve: The pulmonic valve was not well visualized. Pulmonic valve regurgitation is not visualized. No evidence of pulmonic stenosis. Aorta: The aortic root is normal in size and structure. Venous: The inferior vena cava is normal in size with greater than 50% respiratory variability, suggesting right atrial pressure of 3 mmHg. IAS/Shunts: No atrial level shunt detected by color flow Doppler.  LEFT VENTRICLE PLAX 2D LVIDd:         4.00 cm      Diastology LVIDs:         2.80 cm      LV e' medial:    13.50 cm/s LV PW:         1.60 cm      LV E/e' medial:  6.0 LV IVS:        1.50 cm      LV e' lateral:   17.20 cm/s LVOT diam:     1.90 cm      LV E/e' lateral: 4.7 LV SV:         54 LV SV Index:   23 LVOT Area:     2.84 cm  LV Volumes (MOD) LV vol d, MOD A2C: 58.0 ml LV vol d, MOD A4C: 107.0 ml LV vol s, MOD A2C: 29.0 ml LV vol s, MOD A4C: 41.2 ml LV SV MOD A2C:     29.0 ml LV SV MOD A4C:     107.0 ml LV SV MOD BP:      47.0 ml RIGHT VENTRICLE         IVC TAPSE (M-mode): 2.7 cm  IVC diam: 1.70 cm LEFT ATRIUM             Index       RIGHT ATRIUM           Index LA diam:  3.70 cm 1.57 cm/m  RA Area:     15.30 cm LA Vol (A2C):   39.4 ml 16.68 ml/m RA Volume:   37.00 ml  15.67 ml/m LA Vol (A4C):   58.0 ml 24.56 ml/m LA Biplane Vol: 49.4 ml 20.92 ml/m  AORTIC VALVE LVOT Vmax:   110.00 cm/s LVOT Vmean:  75.100 cm/s LVOT VTI:    0.192 m  AORTA Ao Root diam: 3.30 cm MITRAL VALVE MV Area (PHT): 4.60 cm    SHUNTS MV Decel Time: 165 msec    Systemic VTI:  0.19 m MV E velocity: 81.40 cm/s  Systemic Diam: 1.90 cm Dani Gobble Croitoru MD Electronically signed by Sanda Klein MD Signature Date/Time: 03/06/2020/2:21:39 PM    Final     [place] FNA SOFT TISSUE  Result Date: 03/04/2020 INDICATION: History of left total hip replacement, now with concern for infection. Please perform ultrasound-guided aspiration and/or drainage catheter placement for infection source control purposes. EXAM: ULTRASOUND-GUIDED DRAINAGE CATHETER PLACEMENT INTO DOMINANT FLUID COLLECTION INVOLVING THE PROXIMAL LATERAL ASPECT THE LEFT THIGH COMPARISON:  Left hip CT-03/01/2020 MEDICATIONS: The patient is currently admitted to the hospital and receiving intravenous antibiotics. The antibiotics were administered within an appropriate time frame prior to the initiation of the procedure. ANESTHESIA/SEDATION: Moderate (conscious) sedation was employed during this procedure. A total of Versed 2 mg and Fentanyl 100 mcg was administered intravenously. Moderate Sedation Time: 18 minutes. The patient's level of consciousness and vital signs were monitored continuously by radiology nursing throughout the procedure under my direct supervision. CONTRAST:  None COMPLICATIONS: None immediate. PROCEDURE: Informed written consent was obtained from the patient after a discussion of the risks, benefits and alternatives to treatment. The patient was placed supine on his hospital bed with ultrasound imaging demonstrating a large (at least 7.6 x 1.6 cm complex fluid collection with the subcutaneous tissues involving the lateral aspect of the proximal left thigh (representative image 2). The procedure was planned. A timeout was performed prior to the initiation of the procedure. The skin overlying the lateral aspect the left thigh was prepped and draped in the usual sterile fashion. The overlying soft tissues were anesthetized with 1% lidocaine with epinephrine. Utilizing a caudal to cranial approach, an 18 gauge trocar needle was advanced through near the entirety of the abscess/fluid collection within the left lateral thigh and a short Amplatz super stiff wire was coiled within the cranial  component of the collection. Appropriate positioning was confirmed ultrasound imaging. The tract was serially dilated allowing placement of a 12 [place] all-purpose drainage catheter. Appropriate positioning was confirmed with a limited postprocedural CT scan. Next, approximately 50 cc of purulent fluid was aspirated. The tube was connected to a JP bulb and sutured in place. A dressing was placed. The patient tolerated the procedure well without immediate post procedural complication. IMPRESSION: Successful CT guided placement of a 14 French all purpose drain catheter into the complex fluid collection involving the proximal lateral aspect the left thigh with aspiration of 50 cc of purulent fluid. Samples were sent to the laboratory as requested by the ordering clinical team. Electronically Signed   By: Sandi Mariscal M.D.   On: 03/04/2020 15:43   DG Hip Unilat W or Wo Pelvis 2-3 Views Left  Result Date: 03/01/2020 CLINICAL DATA:  Left hip pain radiating down left leg to knee 1 week. EXAM: DG HIP (WITH OR WITHOUT PELVIS) 2-3V LEFT COMPARISON:  Abdominal films 12/05/2019 FINDINGS: Examination demonstrates bilateral hip arthroplasties intact and unchanged. Minimal air in the soft  tissues lateral to the left hip. Known bilateral ureteral stents are present as inferior aspect of the stents come together in the midline of the pelvis extending into the region of the trigone. Moderate degenerate change of the spine. No acute fracture or dislocation. IMPRESSION: 1. Bilateral hip arthroplasties intact and unchanged. No acute fracture or dislocation. Suggestion of mild air over the soft tissues lateral to the left hip as recommend clinical correlation with recent intervention. Soft tissue infection could produce this appearance. 2. Known bilateral ureteral stents as described. Electronically Signed   By: Marin Olp M.D.   On: 03/01/2020 09:21   VAS [place] LOWER EXTREMITY VENOUS (DVT)  Result Date: 03/16/2020  Lower Venous  DVT Study Indications: Edema.  Comparison Study: no prior Performing Technologist: Abram Sander RVS  Examination Guidelines: A complete evaluation includes B-mode imaging, spectral Doppler, color Doppler, and power Doppler as needed of all accessible portions of each vessel. Bilateral testing is considered an integral part of a complete examination. Limited examinations for reoccurring indications may be performed as noted. The reflux portion of the exam is performed with the patient in reverse Trendelenburg.  +-----+---------------+---------+-----------+----------+--------------+ RIGHTCompressibilityPhasicitySpontaneityPropertiesThrombus Aging +-----+---------------+---------+-----------+----------+--------------+ CFV  Full           Yes      Yes                                 +-----+---------------+---------+-----------+----------+--------------+   +---------+---------------+---------+-----------+----------+-------------------+ LEFT     CompressibilityPhasicitySpontaneityPropertiesThrombus Aging      +---------+---------------+---------+-----------+----------+-------------------+ CFV      Full           Yes      Yes                                      +---------+---------------+---------+-----------+----------+-------------------+ SFJ      Full                                                             +---------+---------------+---------+-----------+----------+-------------------+ FV Prox  Full                                                             +---------+---------------+---------+-----------+----------+-------------------+ FV Mid   Full                                                             +---------+---------------+---------+-----------+----------+-------------------+ FV DistalFull                                                             +---------+---------------+---------+-----------+----------+-------------------+ PFV      Full                                                              +---------+---------------+---------+-----------+----------+-------------------+  POP      Full           Yes      Yes                                      +---------+---------------+---------+-----------+----------+-------------------+ PTV      Full                                                             +---------+---------------+---------+-----------+----------+-------------------+ PERO                                                  Not well visualized +---------+---------------+---------+-----------+----------+-------------------+     Summary: RIGHT: - No evidence of common femoral vein obstruction.  LEFT: - There is no evidence of deep vein thrombosis in the lower extremity.  - No cystic structure found in the popliteal fossa.  *See table(s) above for measurements and observations. Electronically signed by Ruta Hinds MD on 03/16/2020 at 5:02:47 PM.    Final      Subjective: - no chest pain, shortness of breath, no abdominal pain, nausea or vomiting.   Discharge Exam: BP (!) 145/69 (BP Location: Right Arm)   Pulse 76   Temp 98.2 F (36.8 C) (Oral)   Resp (!) 22   Ht '5\' 9"'  (1.753 m)   Wt 106.7 kg   SpO2 98%   BMI 34.73 kg/m   General: Pt is alert, awake, not in acute distress Cardiovascular: RRR, S1/S2 +, no rubs, no gallops Respiratory: CTA bilaterally, no wheezing, no rhonchi Abdominal: Soft, NT, ND, bowel sounds + Extremities: no edema, no cyanosis   The results of significant diagnostics from this hospitalization (including imaging, microbiology, ancillary and laboratory) are listed below for reference.     Microbiology: Recent Results (from the past 240 hour(s))  Aerobic/Anaerobic Culture (surgical/deep wound)     Status: None (Preliminary result)   Collection Time: 03/17/20 11:30 AM   Specimen: Abscess  Result Value Ref Range Status   Specimen Description   Final    ABSCESS THIGH  LT Performed at [facility] [address], [place], [place] [address]    Special Requests   Final    NONE Performed at [facility], [place] [address] [place], [place] [address]    Gram Stain   Final    ABUNDANT WBC PRESENT, PREDOMINANTLY PMN NO ORGANISMS SEEN Performed at [facility], [place] [address], [place], [place] [address]    Culture   Final    RARE ENTEROBACTER CLOACAE NO ANAEROBES ISOLATED; CULTURE IN PROGRESS FOR 5 DAYS    Report Status PENDING  Incomplete   Organism ID, Bacteria ENTEROBACTER CLOACAE  Final      Susceptibility   Enterobacter cloacae - MIC*    CEFAZOLIN >=64 RESISTANT Resistant     CEFEPIME <=0.12 SENSITIVE Sensitive     CEFTAZIDIME <=1 SENSITIVE Sensitive     CIPROFLOXACIN <=0.25 SENSITIVE Sensitive     GENTAMICIN <=1 SENSITIVE Sensitive     IMIPENEM 0.5 SENSITIVE Sensitive  TRIMETH/SULFA <=20 SENSITIVE Sensitive     PIP/TAZO <=4 SENSITIVE Sensitive     * RARE ENTEROBACTER CLOACAE  Urine Culture     Status: None   Collection Time: 03/17/20  4:00 PM   Specimen: Urine, Cystoscope  Result Value Ref Range Status   Specimen Description   Final    URINE, RANDOM CYSTO Performed at [facility], [place] [address], [place], [place] [address]    Special Requests   Final    NONE Performed at [facility], [place] [address] [place], [place] [address]    Culture   Final    NO GROWTH Performed at [facility], [place] [address] [place], [place] [address]    Report Status 03/19/2020 FINAL  Final  Resp Panel by RT-PCR (Flu A&B, Covid) Nasopharyngeal Swab     Status: None   Collection Time: 03/20/20  8:45 AM   Specimen: Nasopharyngeal Swab; Nasopharyngeal(NP) swabs in vial transport medium  Result Value Ref Range Status   SARS Coronavirus 2 by RT PCR NEGATIVE NEGATIVE Final    Comment: (NOTE) SARS-CoV-2 target nucleic acids are NOT DETECTED.  The SARS-CoV-2 RNA is  generally detectable in upper respiratory specimens during the acute phase of infection. The lowest concentration of SARS-CoV-2 viral copies this assay can detect is 138 copies/mL. A negative result does not preclude SARS-Cov-2 infection and should not be used as the sole basis for treatment or other patient management decisions. A negative result may occur with  improper specimen collection/handling, submission of specimen other than nasopharyngeal swab, presence of viral mutation(s) within the areas targeted by this assay, and inadequate number of viral copies(<138 copies/mL). A negative result must be combined with clinical observations, patient history, and epidemiological information. The expected result is Negative.  Fact Sheet for Patients:  EntrepreneurPulse.com.au  Fact Sheet for Healthcare Providers:  IncredibleEmployment.be  This test is no t yet approved or cleared by the [place] FDA and  has been authorized for detection and/or diagnosis of SARS-CoV-2 by FDA under an Emergency Use Authorization (EUA). This EUA will remain  in effect (meaning this test can be used) for the duration of the COVID-19 declaration under Section 564(b)(1) of the Act, 21 U.S.C.section 360bbb-3(b)(1), unless the authorization is terminated  or revoked sooner.       Influenza A by PCR NEGATIVE NEGATIVE Final   Influenza B by PCR NEGATIVE NEGATIVE Final    Comment: (NOTE) The Xpert Xpress SARS-CoV-2/FLU/RSV plus assay is intended as an aid in the diagnosis of influenza from Nasopharyngeal swab specimens and should not be used as a sole basis for treatment. Nasal washings and aspirates are unacceptable for Xpert Xpress SARS-CoV-2/FLU/RSV testing.  Fact Sheet for Patients: EntrepreneurPulse.com.au  Fact Sheet for Healthcare Providers: IncredibleEmployment.be  This test is not yet approved or cleared by the [place] FDA and has been authorized for detection and/or diagnosis of SARS-CoV-2 by FDA under an Emergency Use Authorization (EUA). This EUA will remain in effect (meaning this test can be used) for the duration of the COVID-19 declaration under Section 564(b)(1) of the Act, 21 U.S.C. section 360bbb-3(b)(1), unless the authorization is terminated or revoked.  Performed at [facility], [place] [address], [place],  [address]      Labs: Basic Metabolic Panel: Recent Labs  Lab 03/16/20 0350 03/17/20 0541 03/18/20 0350 03/19/20 0450 03/20/20 0300  NA 138 138 138 139 138  K 4.8 4.7 5.4* 4.6 4.7  CL 111 110  110 110 110  CO2 19* 21* 18* 22 21*  GLUCOSE 139* 142* 287* 176* 178*  BUN '23 22 21 21 20  ' CREATININE 1.41* 1.28* 1.27* 1.25* 1.14  CALCIUM 7.4* 7.6* 8.1* 7.6* 7.5*  MG 1.7 1.5* 1.9 1.6* 1.5*   Liver Function Tests: Recent Labs  Lab 03/19/20 0450  AST 14*  ALT 18  ALKPHOS 56  BILITOT 0.4  PROT 5.5*  ALBUMIN 1.9*   CBC: Recent Labs  Lab 03/16/20 2305 03/17/20 0541 03/18/20 0350 03/19/20 0450 03/20/20 0300  WBC 7.9 6.5 5.4 7.0 7.6  NEUTROABS 6.4  --   --   --   --   HGB 8.2* 8.0* 9.2* 7.7* 9.0*  HCT 25.7* 24.8* 28.6* 24.5* 28.2*  MCV 93.5 93.6 94.1 94.2 92.8  PLT 303 271 349 310 352   CBG: Recent Labs  Lab 03/19/20 1135 03/19/20 1614 03/19/20 2124 03/20/20 0722 03/20/20 1117  GLUCAP 161* 132* 164* 133* 156*   Hgb A1c No results for input(s): HGBA1C in the last 72 hours. Lipid Profile No results for input(s): CHOL, HDL, LDLCALC, TRIG, CHOLHDL, LDLDIRECT in the last 72 hours. Thyroid function studies No results for input(s): TSH, T4TOTAL, T3FREE, THYROIDAB in the last 72 hours.  Invalid input(s): FREET3 Urinalysis    Component Value Date/Time   COLORURINE YELLOW 03/05/2020 0100   APPEARANCEUR TURBID (A) 03/05/2020 0100   LABSPEC 1.016 03/05/2020 0100   PHURINE 5.0 03/05/2020 0100   GLUCOSEU NEGATIVE 03/05/2020 0100    HGBUR LARGE (A) 03/05/2020 0100   BILIRUBINUR NEGATIVE 03/05/2020 0100   BILIRUBINUR negative 02/21/2020 1132   KETONESUR NEGATIVE 03/05/2020 0100   PROTEINUR 100 (A) 03/05/2020 0100   UROBILINOGEN 0.2 02/21/2020 1132   UROBILINOGEN 0.2 10/24/2014 1035   NITRITE NEGATIVE 03/05/2020 0100   LEUKOCYTESUR MODERATE (A) 03/05/2020 0100    FURTHER DISCHARGE INSTRUCTIONS:   Get Medicines reviewed and adjusted: Please take all your medications with you for your next visit with your Primary MD   Laboratory/radiological data: Please request your Primary MD to go over all hospital tests and procedure/radiological results at the follow up, please ask your Primary MD to get all Hospital records sent to his/her office.   In some cases, they will be blood work, cultures and biopsy results pending at the time of your discharge. Please request that your primary care M.D. goes through all the records of your hospital data and follows up on these results.   Also Note the following: If you experience worsening of your admission symptoms, develop shortness of breath, life threatening emergency, suicidal or homicidal thoughts you must seek medical attention immediately by calling 911 or calling your MD immediately  if symptoms less severe.   You must read complete instructions/literature along with all the possible adverse reactions/side effects for all the Medicines you take and that have been prescribed to you. Take any new Medicines after you have completely understood and accpet all the possible adverse reactions/side effects.    Do not drive when taking Pain medications or sleeping medications (Benzodaizepines)   Do not take more than prescribed Pain, Sleep and Anxiety Medications. It is not advisable to combine anxiety,sleep and pain medications without talking with your primary care practitioner   Special Instructions: If you have smoked or chewed Tobacco  in the last 2 yrs please stop smoking, stop  any regular Alcohol  and or any Recreational drug use.   Wear Seat belts while driving.   Please note: You were cared for by  a hospitalist during your hospital stay. Once you are discharged, your primary care physician will handle any further medical issues. Please note that NO REFILLS for any discharge medications will be authorized once you are discharged, as it is imperative that you return to your primary care physician (or establish a relationship with a primary care physician if you do not have one) for your post hospital discharge needs so that they can reassess your need for medications and monitor your lab values.  Time coordinating discharge: 40 minutes  SIGNED:  Marzetta Board, MD, PhD 03/20/2020, 12:20 PM

## 2020-03-20 NOTE — Progress Notes (Signed)
3 Days Post-Op Subjective: Patient completed breakfast. Doing well.   Objective: Vital signs in last 24 hours: Temp:  [98 F (36.7 C)-98.3 F (36.8 C)] 98.2 F (36.8 C) (12/10 0600) Pulse Rate:  [75-78] 76 (12/10 0600) Resp:  [15-22] 22 (12/10 0600) BP: (127-145)/(59-76) 145/69 (12/10 0600) SpO2:  [95 %-98 %] 98 % (12/10 0600)  Intake/Output from previous day: 12/09 0701 - 12/10 0700 In: 1227.5 [P.O.:840; Blood:357.5] Out: 3360 [Urine:3200; Drains:160] Intake/Output this shift: No intake/output data recorded.  Physical Exam:  Urine clear. I removed the foley catheter and left ureteral stent (string taped to foley) intact. He tolerated well.   Lab Results: Recent Labs    03/18/20 0350 03/19/20 0450 03/20/20 0300  HGB 9.2* 7.7* 9.0*  HCT 28.6* 24.5* 28.2*   BMET Recent Labs    03/19/20 0450 03/20/20 0300  NA 139 138  K 4.6 4.7  CL 110 110  CO2 22 21*  GLUCOSE 176* 178*  BUN 21 20  CREATININE 1.25* 1.14  CALCIUM 7.6* 7.5*   No results for input(s): LABPT, INR in the last 72 hours. No results for input(s): LABURIN in the last 72 hours. Results for orders placed or performed during the hospital encounter of 03/03/20  Culture, blood (routine x 2)     Status: Abnormal   Collection Time: 03/03/20  7:04 PM   Specimen: BLOOD LEFT HAND  Result Value Ref Range Status   Specimen Description   Final    BLOOD LEFT HAND Performed at [facility] [address], [place], [place] [address]    Special Requests   Final    BOTTLES DRAWN AEROBIC ONLY Blood Culture adequate volume Performed at [facility] [address], [place], [place] [address]    Culture  Setup Time   Final    AEROBIC BOTTLE ONLY GRAM NEGATIVE RODS CRITICAL RESULT CALLED TO, READ BACK BY AND VERIFIED WITH: Ulice Bold PharmD 9:20 03/06/20 ([email or web address]) Performed at [facility], [place] [address], [place], [place] [address]    Culture ENTEROBACTER CLOACAE (A)   Final   Report Status 03/08/2020 FINAL  Final   Organism ID, Bacteria ENTEROBACTER CLOACAE  Final      Susceptibility   Enterobacter cloacae - MIC*    CEFAZOLIN >=64 RESISTANT Resistant     CEFEPIME <=0.12 SENSITIVE Sensitive     CEFTAZIDIME <=1 SENSITIVE Sensitive     CIPROFLOXACIN <=0.25 SENSITIVE Sensitive     GENTAMICIN <=1 SENSITIVE Sensitive     IMIPENEM <=0.25 SENSITIVE Sensitive     TRIMETH/SULFA <=20 SENSITIVE Sensitive     PIP/TAZO <=4 SENSITIVE Sensitive     * ENTEROBACTER CLOACAE  Blood Culture ID Panel (Reflexed)     Status: Abnormal   Collection Time: 03/03/20  7:04 PM  Result Value Ref Range Status   Enterococcus faecalis NOT DETECTED NOT DETECTED Final   Enterococcus Faecium NOT DETECTED NOT DETECTED Final   Listeria monocytogenes NOT DETECTED NOT DETECTED Final   Staphylococcus species NOT DETECTED NOT DETECTED Final   Staphylococcus aureus (BCID) NOT DETECTED NOT DETECTED Final   Staphylococcus epidermidis NOT DETECTED NOT DETECTED Final   Staphylococcus lugdunensis NOT DETECTED NOT DETECTED Final   Streptococcus species NOT DETECTED NOT DETECTED Final   Streptococcus agalactiae NOT DETECTED NOT DETECTED Final   Streptococcus pneumoniae NOT DETECTED NOT DETECTED Final   Streptococcus pyogenes NOT DETECTED NOT DETECTED Final   A.calcoaceticus-baumannii NOT DETECTED NOT DETECTED Final   Bacteroides fragilis NOT DETECTED NOT  DETECTED Final   Enterobacterales DETECTED (A) NOT DETECTED Final    Comment: Enterobacterales represent a large order of gram negative bacteria, not a single organism. CRITICAL RESULT CALLED TO, READ BACK BY AND VERIFIED WITH: Ulice Bold PharmD 9:20 03/06/20 ([email or web address])    Enterobacter cloacae complex DETECTED (A) NOT DETECTED Final    Comment: CRITICAL RESULT CALLED TO, READ BACK BY AND VERIFIED WITH: Ulice Bold PharmD 9:20 03/06/20 ([email or web address])    Escherichia coli NOT DETECTED NOT DETECTED Final   Klebsiella aerogenes NOT DETECTED NOT DETECTED  Final   Klebsiella oxytoca NOT DETECTED NOT DETECTED Final   Klebsiella pneumoniae NOT DETECTED NOT DETECTED Final   Proteus species NOT DETECTED NOT DETECTED Final   Salmonella species NOT DETECTED NOT DETECTED Final   Serratia marcescens NOT DETECTED NOT DETECTED Final   Haemophilus influenzae NOT DETECTED NOT DETECTED Final   Neisseria meningitidis NOT DETECTED NOT DETECTED Final   Pseudomonas aeruginosa NOT DETECTED NOT DETECTED Final   Stenotrophomonas maltophilia NOT DETECTED NOT DETECTED Final   Candida albicans NOT DETECTED NOT DETECTED Final   Candida auris NOT DETECTED NOT DETECTED Final   Candida glabrata NOT DETECTED NOT DETECTED Final   Candida krusei NOT DETECTED NOT DETECTED Final   Candida parapsilosis NOT DETECTED NOT DETECTED Final   Candida tropicalis NOT DETECTED NOT DETECTED Final   Cryptococcus neoformans/gattii NOT DETECTED NOT DETECTED Final   CTX-M ESBL NOT DETECTED NOT DETECTED Final   Carbapenem resistance IMP NOT DETECTED NOT DETECTED Final   Carbapenem resistance KPC NOT DETECTED NOT DETECTED Final   Carbapenem resistance NDM NOT DETECTED NOT DETECTED Final   Carbapenem resist OXA 48 LIKE NOT DETECTED NOT DETECTED Final   Carbapenem resistance VIM NOT DETECTED NOT DETECTED Final    Comment: Performed at [facility], [address], [place], [place] [address]  Culture, blood (routine x 2)     Status: None   Collection Time: 03/03/20  7:06 PM   Specimen: BLOOD RIGHT HAND  Result Value Ref Range Status   Specimen Description   Final    BLOOD RIGHT HAND Performed at [facility] [address] [place], [place] [address]    Special Requests   Final    BOTTLES DRAWN AEROBIC ONLY Blood Culture adequate volume Performed at [facility] [address], [place], [place] [address]    Culture   Final    NO GROWTH 5 DAYS Performed at [facility], [place] [address], [place], [place] [address]    Report Status  03/08/2020 FINAL  Final  Aerobic/Anaerobic Culture (surgical/deep wound)     Status: None   Collection Time: 03/04/20  3:38 PM   Specimen: Abscess  Result Value Ref Range Status   Specimen Description   Final    ABSCESS DRAIN LEFT LATERAL THIGH Performed at [facility] [address] [place], [place] [address]    Special Requests   Final    NONE Performed at [facility], [place] [address] [place], [place] [address]    Gram Stain   Final    ABUNDANT WBC PRESENT,BOTH PMN AND MONONUCLEAR ABUNDANT GRAM NEGATIVE RODS    Culture   Final    ABUNDANT ENTEROBACTER CLOACAE NO ANAEROBES ISOLATED Performed at [facility], [place] [address] [place], [place] [address]    Report Status 03/09/2020 FINAL  Final   Organism ID, Bacteria ENTEROBACTER CLOACAE  Final      Susceptibility  Enterobacter cloacae - MIC*    CEFAZOLIN >=64 RESISTANT Resistant     CEFEPIME <=0.12 SENSITIVE Sensitive     CEFTAZIDIME <=1 SENSITIVE Sensitive     CIPROFLOXACIN <=0.25 SENSITIVE Sensitive     GENTAMICIN <=1 SENSITIVE Sensitive     IMIPENEM 0.5 SENSITIVE Sensitive     TRIMETH/SULFA <=20 SENSITIVE Sensitive     PIP/TAZO <=4 SENSITIVE Sensitive     * ABUNDANT ENTEROBACTER CLOACAE  Culture, Urine     Status: Abnormal   Collection Time: 03/05/20  8:10 AM   Specimen: Urine, Random  Result Value Ref Range Status   Specimen Description   Final    URINE, RANDOM Performed at [facility], [place] [address] [place], [place] [address]    Special Requests   Final    NONE Performed at [facility], [place] [address] [place], [place] [address]    Culture >=100,000 COLONIES/mL ENTEROBACTER SPECIES (A)  Final   Report Status 03/07/2020 FINAL  Final   Organism ID, Bacteria ENTEROBACTER SPECIES (A)  Final      Susceptibility   Enterobacter species - MIC*    CEFAZOLIN >=64 RESISTANT Resistant     CEFEPIME <=0.12 SENSITIVE Sensitive      CEFTRIAXONE <=0.25 SENSITIVE Sensitive     CIPROFLOXACIN <=0.25 SENSITIVE Sensitive     GENTAMICIN <=1 SENSITIVE Sensitive     IMIPENEM 0.5 SENSITIVE Sensitive     NITROFURANTOIN 32 SENSITIVE Sensitive     TRIMETH/SULFA <=20 SENSITIVE Sensitive     PIP/TAZO <=4 SENSITIVE Sensitive     * >=100,000 COLONIES/mL ENTEROBACTER SPECIES  Culture, blood (Routine X 2) w Reflex to ID Panel     Status: None   Collection Time: 03/09/20 12:50 PM   Specimen: BLOOD  Result Value Ref Range Status   Specimen Description   Final    BLOOD RIGHT WRIST Performed at [facility] [address], [place], [place] [address]    Special Requests   Final    BOTTLES DRAWN AEROBIC ONLY Blood Culture adequate volume Performed at [facility] [address] [place], [place] [address]    Culture   Final    NO GROWTH 5 DAYS Performed at [facility], [place] [address] [place], [place] [address]    Report Status 03/14/2020 FINAL  Final  Culture, blood (Routine X 2) w Reflex to ID Panel     Status: None   Collection Time: 03/09/20 12:50 PM   Specimen: BLOOD RIGHT HAND  Result Value Ref Range Status   Specimen Description   Final    BLOOD RIGHT HAND Performed at [facility] [address] [place], [place] [address]    Special Requests   Final    BOTTLES DRAWN AEROBIC AND ANAEROBIC Blood Culture adequate volume Performed at [facility] [address], [place], [place] [address]    Culture   Final    NO GROWTH 5 DAYS Performed at [facility], [place] [address], [place], [place] [address]    Report Status 03/14/2020 FINAL  Final  Aerobic/Anaerobic Culture (surgical/deep wound)     Status: None   Collection Time: 03/09/20  3:53 PM   Specimen: PATH Other; Tissue  Result Value Ref Range Status   Specimen Description   Final    ABSCESS RIGHT HIP Performed at [facility] [address],  [place], [place] [address]    Special Requests   Final  NONE Performed at [facility], [place] [address] [place], [place] [address]    Gram Stain   Final    FEW WBC PRESENT,BOTH PMN AND MONONUCLEAR FEW GRAM NEGATIVE RODS RARE GRAM POSITIVE COCCI    Culture   Final    FEW ENTEROBACTER CLOACAE NO ANAEROBES ISOLATED Performed at [facility], [place] [address], [place], [place] [address]    Report Status 03/14/2020 FINAL  Final   Organism ID, Bacteria ENTEROBACTER CLOACAE  Final      Susceptibility   Enterobacter cloacae - MIC*    CEFAZOLIN >=64 RESISTANT Resistant     CEFEPIME <=0.12 SENSITIVE Sensitive     CEFTAZIDIME <=1 SENSITIVE Sensitive     CIPROFLOXACIN <=0.25 SENSITIVE Sensitive     GENTAMICIN <=1 SENSITIVE Sensitive     IMIPENEM 0.5 SENSITIVE Sensitive     TRIMETH/SULFA <=20 SENSITIVE Sensitive     PIP/TAZO <=4 SENSITIVE Sensitive     * FEW ENTEROBACTER CLOACAE  Aerobic/Anaerobic Culture (surgical/deep wound)     Status: None (Preliminary result)   Collection Time: 03/17/20 11:30 AM   Specimen: Abscess  Result Value Ref Range Status   Specimen Description   Final    ABSCESS THIGH LT Performed at [facility] [address], [place], [place] [address]    Special Requests   Final    NONE Performed at [facility], [place] [address] [place], [place] [address]    Gram Stain   Final    ABUNDANT WBC PRESENT, PREDOMINANTLY PMN NO ORGANISMS SEEN Performed at [facility], [place] [address] [place], [place] [address]    Culture   Final    RARE ENTEROBACTER CLOACAE NO ANAEROBES ISOLATED; CULTURE IN PROGRESS FOR 5 DAYS    Report Status PENDING  Incomplete   Organism ID, Bacteria ENTEROBACTER CLOACAE  Final      Susceptibility   Enterobacter cloacae - MIC*    CEFAZOLIN >=64 RESISTANT Resistant     CEFEPIME <=0.12 SENSITIVE Sensitive     CEFTAZIDIME <=1 SENSITIVE Sensitive     CIPROFLOXACIN <=0.25 SENSITIVE  Sensitive     GENTAMICIN <=1 SENSITIVE Sensitive     IMIPENEM 0.5 SENSITIVE Sensitive     TRIMETH/SULFA <=20 SENSITIVE Sensitive     PIP/TAZO <=4 SENSITIVE Sensitive     * RARE ENTEROBACTER CLOACAE  Urine Culture     Status: None   Collection Time: 03/17/20  4:00 PM   Specimen: Urine, Cystoscope  Result Value Ref Range Status   Specimen Description   Final    URINE, RANDOM CYSTO Performed at [facility] [address], [place], [place] [address]    Special Requests   Final    NONE Performed at [facility], [place] [address], [place], [place] [address]    Culture   Final    NO GROWTH Performed at [facility], [place] [address], [place], [place] [address]    Report Status 03/19/2020 FINAL  Final    Studies/Results: No results found.  Assessment/Plan:  Retained bilateral stents placed for a left distal stone/renal stones urgently 10/21 s/p right stent removal, left ureteroscopy, laser lithotripsy and stent exchange 03/17/2020 with foley and left stent removed today --   He is stent free. F/u with me as planned on 04/30/2019 for renal US and OV.    LOS: 16 days   Festus Aloe 03/20/2020, 8:17 AM

## 2020-03-20 NOTE — TOC Progression Note (Signed)
Transition of Care [facility]) - Progression Note    Patient Details  Name: [name] MRN: [id number] Date of Birth: May 06, 1945  [facility]) CM/SW Contact  Purcell Mouton, RN Phone Number: 03/20/2020, 1:59 PM  Clinical Narrative:    Corey Harold was called, it will be awhile before pickup.   Expected Discharge Plan: [place] Barriers to Discharge: No Barriers Identified  Expected Discharge Plan and Services Expected Discharge Plan: [place]   Discharge Planning Services: CM Consult   Living arrangements for the past 2 months: Single Family Home Expected Discharge Date: 03/20/20                                     Social Determinants of Health (SDOH) Interventions    Readmission Risk Interventions No flowsheet data found.

## 2020-03-20 NOTE — Progress Notes (Signed)
Report given to Mirqutta P., LPN , information given on JP drain flushing and output documentation. Waiting on ambulance for transportation to facility. Daughter Armon Orvis- [phone]  informed her of patient's discharge and she stated she will notify other family members.

## 2020-03-20 NOTE — TOC Progression Note (Signed)
Transition of Care [facility]) - Progression Note    Patient Details  Name: [name] MRN: [id number] Date of Birth: 1945/09/18  Transition of [facility]) CM/SW Contact  Purcell Mouton, RN Phone Number: 03/20/2020, 1:42 PM  Clinical Narrative:    Pt was approved for [facility] by Apolonio Schneiders [phone], Enzo Bi  [phone], next review due 03/27/20. A call was made to pt's daughter Zacarias Pontes with no answer.    Expected Discharge Plan: [place] Barriers to Discharge: No Barriers Identified  Expected Discharge Plan and Services Expected Discharge Plan: [place]   Discharge Planning Services: CM Consult   Living arrangements for the past 2 months: Single Family Home Expected Discharge Date: 03/20/20                                     Social Determinants of Health (SDOH) Interventions    Readmission Risk Interventions No flowsheet data found.

## 2020-03-21 DIAGNOSIS — I480 Paroxysmal atrial fibrillation: Secondary | ICD-10-CM | POA: Diagnosis not present

## 2020-03-21 DIAGNOSIS — R278 Other lack of coordination: Secondary | ICD-10-CM | POA: Diagnosis not present

## 2020-03-21 DIAGNOSIS — D500 Iron deficiency anemia secondary to blood loss (chronic): Secondary | ICD-10-CM | POA: Diagnosis not present

## 2020-03-21 DIAGNOSIS — Z471 Aftercare following joint replacement surgery: Secondary | ICD-10-CM | POA: Diagnosis not present

## 2020-03-21 DIAGNOSIS — R7881 Bacteremia: Secondary | ICD-10-CM | POA: Diagnosis not present

## 2020-03-21 DIAGNOSIS — S7012XD Contusion of left thigh, subsequent encounter: Secondary | ICD-10-CM | POA: Diagnosis not present

## 2020-03-21 DIAGNOSIS — M00852 Arthritis due to other bacteria, left hip: Secondary | ICD-10-CM | POA: Diagnosis not present

## 2020-03-21 DIAGNOSIS — Z7409 Other reduced mobility: Secondary | ICD-10-CM | POA: Diagnosis not present

## 2020-03-21 DIAGNOSIS — M25559 Pain in unspecified hip: Secondary | ICD-10-CM | POA: Diagnosis not present

## 2020-03-21 DIAGNOSIS — I10 Essential (primary) hypertension: Secondary | ICD-10-CM | POA: Diagnosis not present

## 2020-03-21 DIAGNOSIS — L02415 Cutaneous abscess of right lower limb: Secondary | ICD-10-CM | POA: Diagnosis not present

## 2020-03-21 DIAGNOSIS — R5381 Other malaise: Secondary | ICD-10-CM | POA: Diagnosis not present

## 2020-03-21 DIAGNOSIS — L02416 Cutaneous abscess of left lower limb: Secondary | ICD-10-CM | POA: Diagnosis not present

## 2020-03-21 DIAGNOSIS — Z741 Need for assistance with personal care: Secondary | ICD-10-CM | POA: Diagnosis not present

## 2020-03-21 DIAGNOSIS — N1832 Chronic kidney disease, stage 3b: Secondary | ICD-10-CM | POA: Diagnosis not present

## 2020-03-21 DIAGNOSIS — R0989 Other specified symptoms and signs involving the circulatory and respiratory systems: Secondary | ICD-10-CM | POA: Diagnosis not present

## 2020-03-21 DIAGNOSIS — B9689 Other specified bacterial agents as the cause of diseases classified elsewhere: Secondary | ICD-10-CM | POA: Diagnosis not present

## 2020-03-21 DIAGNOSIS — T8452XD Infection and inflammatory reaction due to internal left hip prosthesis, subsequent encounter: Secondary | ICD-10-CM | POA: Diagnosis not present

## 2020-03-21 DIAGNOSIS — K56609 Unspecified intestinal obstruction, unspecified as to partial versus complete obstruction: Secondary | ICD-10-CM | POA: Diagnosis not present

## 2020-03-21 DIAGNOSIS — E11649 Type 2 diabetes mellitus with hypoglycemia without coma: Secondary | ICD-10-CM | POA: Diagnosis not present

## 2020-03-21 DIAGNOSIS — T8452XA Infection and inflammatory reaction due to internal left hip prosthesis, initial encounter: Secondary | ICD-10-CM | POA: Diagnosis not present

## 2020-03-21 DIAGNOSIS — E785 Hyperlipidemia, unspecified: Secondary | ICD-10-CM | POA: Diagnosis not present

## 2020-03-21 DIAGNOSIS — M169 Osteoarthritis of hip, unspecified: Secondary | ICD-10-CM | POA: Diagnosis not present

## 2020-03-21 DIAGNOSIS — M25541 Pain in joints of right hand: Secondary | ICD-10-CM | POA: Diagnosis not present

## 2020-03-21 DIAGNOSIS — R059 Cough, unspecified: Secondary | ICD-10-CM | POA: Diagnosis not present

## 2020-03-21 DIAGNOSIS — M7989 Other specified soft tissue disorders: Secondary | ICD-10-CM | POA: Diagnosis not present

## 2020-03-21 DIAGNOSIS — M2550 Pain in unspecified joint: Secondary | ICD-10-CM | POA: Diagnosis not present

## 2020-03-21 DIAGNOSIS — C61 Malignant neoplasm of prostate: Secondary | ICD-10-CM | POA: Diagnosis not present

## 2020-03-21 DIAGNOSIS — Z7401 Bed confinement status: Secondary | ICD-10-CM | POA: Diagnosis not present

## 2020-03-21 DIAGNOSIS — R2689 Other abnormalities of gait and mobility: Secondary | ICD-10-CM | POA: Diagnosis not present

## 2020-03-21 DIAGNOSIS — M25562 Pain in left knee: Secondary | ICD-10-CM | POA: Diagnosis not present

## 2020-03-21 DIAGNOSIS — R600 Localized edema: Secondary | ICD-10-CM | POA: Diagnosis not present

## 2020-03-21 DIAGNOSIS — I2510 Atherosclerotic heart disease of native coronary artery without angina pectoris: Secondary | ICD-10-CM | POA: Diagnosis not present

## 2020-03-21 DIAGNOSIS — M10041 Idiopathic gout, right hand: Secondary | ICD-10-CM | POA: Diagnosis not present

## 2020-03-21 DIAGNOSIS — M109 Gout, unspecified: Secondary | ICD-10-CM | POA: Diagnosis not present

## 2020-03-21 DIAGNOSIS — Z96642 Presence of left artificial hip joint: Secondary | ICD-10-CM | POA: Diagnosis not present

## 2020-03-21 DIAGNOSIS — M25452 Effusion, left hip: Secondary | ICD-10-CM | POA: Diagnosis not present

## 2020-03-21 DIAGNOSIS — A4159 Other Gram-negative sepsis: Secondary | ICD-10-CM | POA: Diagnosis not present

## 2020-03-21 DIAGNOSIS — N183 Chronic kidney disease, stage 3 unspecified: Secondary | ICD-10-CM | POA: Diagnosis not present

## 2020-03-21 LAB — CBC
HCT: 29 % — ABNORMAL LOW (ref 39.0–52.0)
Hemoglobin: 9.3 g/dL — ABNORMAL LOW (ref 13.0–17.0)
MCH: 29.7 pg (ref 26.0–34.0)
MCHC: 32.1 g/dL (ref 30.0–36.0)
MCV: 92.7 fL (ref 80.0–100.0)
Platelets: 260 10*3/uL (ref 150–400)
RBC: 3.13 MIL/uL — ABNORMAL LOW (ref 4.22–5.81)
RDW: 15.6 % — ABNORMAL HIGH (ref 11.5–15.5)
WBC: 8.4 10*3/uL (ref 4.0–10.5)
nRBC: 0 % (ref 0.0–0.2)

## 2020-03-21 LAB — MAGNESIUM: Magnesium: 1.4 mg/dL — ABNORMAL LOW (ref 1.7–2.4)

## 2020-03-21 MED ORDER — MAGNESIUM SULFATE 4 GM/100ML IV SOLN
4.0000 g | Freq: Once | INTRAVENOUS | Status: DC
  Filled 2020-03-21: qty 100

## 2020-03-21 NOTE — Progress Notes (Signed)
PTAR here to transport pt to SNF Facility  This RN contacted Admissions at [facility] and they will be receiving this patient.   Discharge paperwork given to [facility] transport staff.   Discharge packet with transport staff

## 2020-03-21 NOTE — Progress Notes (Signed)
MD on call paged/ Charge Nurse Informed  Patient was not picked up by PTAR.  F/u calls were made x3 throughout the shift, PTAR unable to give ETA for patient pickup.  Patient updated.   Discharge packet at the front desk.   Will continue to monitor.

## 2020-03-22 LAB — AEROBIC/ANAEROBIC CULTURE W GRAM STAIN (SURGICAL/DEEP WOUND)

## 2020-03-23 ENCOUNTER — Other Ambulatory Visit: Payer: Self-pay

## 2020-03-23 ENCOUNTER — Other Ambulatory Visit: Payer: Self-pay | Admitting: Infectious Disease

## 2020-03-23 DIAGNOSIS — L02419 Cutaneous abscess of limb, unspecified: Secondary | ICD-10-CM

## 2020-03-23 NOTE — Patient Outreach (Signed)
  [facility]) Care Management Chronic Special Needs Program    03/23/2020  Name: BRYN PERKIN, DOB: October 23, 1945  MRN: [id number]   Mr. Kage Willmann is enrolled in a chronic special needs plan. Client admitted 03/03/20 with Left hip septic arthritis/prosthetic joint infection/abscess, enterobacter bacteremia. Client transferred on 03/20/2020 to [facility] skilled facility.  Plan: [facility] Management will continue to provide services for this member through 04/10/2020. [facility] Care Management Team will assume care 04/11/2020.   Thea Silversmith, RN, MSN, [place] [phone]

## 2020-03-24 DIAGNOSIS — R7881 Bacteremia: Secondary | ICD-10-CM | POA: Diagnosis not present

## 2020-03-24 DIAGNOSIS — M00852 Arthritis due to other bacteria, left hip: Secondary | ICD-10-CM | POA: Diagnosis not present

## 2020-03-24 DIAGNOSIS — I10 Essential (primary) hypertension: Secondary | ICD-10-CM | POA: Diagnosis not present

## 2020-03-24 DIAGNOSIS — I480 Paroxysmal atrial fibrillation: Secondary | ICD-10-CM | POA: Diagnosis not present

## 2020-03-25 ENCOUNTER — Other Ambulatory Visit: Payer: Self-pay

## 2020-03-25 DIAGNOSIS — M25541 Pain in joints of right hand: Secondary | ICD-10-CM | POA: Diagnosis not present

## 2020-03-25 DIAGNOSIS — M10041 Idiopathic gout, right hand: Secondary | ICD-10-CM | POA: Diagnosis not present

## 2020-03-25 NOTE — Patient Outreach (Signed)
  [place] [facility]) Care Management Chronic Special Needs Program   03/25/2020  Name: DESMAN POLAK, DOB: Jul 02, 1945  MRN: [id number]  The client was discussed in today's interdisciplinary care team meeting.  The following issues were discussed:  Client's needs, Changes in health status, Care Plan, Coordination of care, Care transitions and Issues/barriers to care  Participants present:   Bary Castilla, BSN, [place], CCM Quinn Plowman, BSN, CCM [employer], BSN, CCM, CDE Thea Silversmith, BSN, MSN, CCM Jacqlyn Larsen, RNCM [place] [facility], [facility]/ CMAA Dr. Norman Clay, [place], [facility] HTA  Plan: [facility] Management will continue to provide services for this member through 04/10/2020. [facility] Care Management Team will assume care 04/11/2020.  Thea Silversmith, RN, MSN, [place] [phone]

## 2020-04-01 ENCOUNTER — Other Ambulatory Visit: Payer: Self-pay | Admitting: Diagnostic Radiology

## 2020-04-01 ENCOUNTER — Other Ambulatory Visit: Payer: HMO

## 2020-04-01 ENCOUNTER — Ambulatory Visit
Admission: RE | Admit: 2020-04-01 | Discharge: 2020-04-01 | Disposition: A | Discharge status: Home / Self Care | Payer: HMO | Source: Ambulatory Visit | Attending: Radiology | Admitting: Radiology

## 2020-04-01 ENCOUNTER — Other Ambulatory Visit: Payer: Self-pay

## 2020-04-01 ENCOUNTER — Ambulatory Visit
Admission: RE | Admit: 2020-04-01 | Discharge: 2020-04-01 | Disposition: A | Discharge status: Home / Self Care | Payer: HMO | Source: Ambulatory Visit | Attending: Diagnostic Radiology | Admitting: Diagnostic Radiology

## 2020-04-01 ENCOUNTER — Encounter: Payer: Self-pay | Admitting: *Deleted

## 2020-04-01 ENCOUNTER — Ambulatory Visit
Admission: RE | Admit: 2020-04-01 | Discharge: 2020-04-01 | Disposition: A | Discharge status: Home / Self Care | Payer: HMO | Source: Ambulatory Visit | Attending: Infectious Disease | Admitting: Infectious Disease

## 2020-04-01 DIAGNOSIS — T8452XA Infection and inflammatory reaction due to internal left hip prosthesis, initial encounter: Secondary | ICD-10-CM | POA: Diagnosis not present

## 2020-04-01 DIAGNOSIS — L02419 Cutaneous abscess of limb, unspecified: Secondary | ICD-10-CM

## 2020-04-01 DIAGNOSIS — Z96642 Presence of left artificial hip joint: Secondary | ICD-10-CM | POA: Diagnosis not present

## 2020-04-01 DIAGNOSIS — L02416 Cutaneous abscess of left lower limb: Secondary | ICD-10-CM | POA: Diagnosis not present

## 2020-04-01 DIAGNOSIS — M7989 Other specified soft tissue disorders: Secondary | ICD-10-CM | POA: Diagnosis not present

## 2020-04-01 DIAGNOSIS — Z471 Aftercare following joint replacement surgery: Secondary | ICD-10-CM | POA: Diagnosis not present

## 2020-04-01 HISTORY — PX: IR RADIOLOGIST EVAL & MGMT: IMG5224

## 2020-04-01 NOTE — Progress Notes (Signed)
Referring Physician(s): Allred,Darrell K  Chief Complaint: The patient is seen in follow up today s/p left hip abscess  History of present illness:  [name] is a 74 year old male with history of CAD, DM, HTN, CKD, prostate cancer, renal stones with hydronephrosis s/p ureteral stents, OA s/p left hip arthroplasty in 2001 who was admitted from orthopedic care through the ED at [facility] on 03/01/20 with left hip pain, chills, nausea, and diarrhea found to have septic arthritis. CT Hip showed a large fluid and air filled collection at the proximal femoral component and he underwent percutaneous drainage 03/04/20.  He then underwent I&D of the left hip with liner exchange 03/09/20 and IR drain was removed at that time. CT scan performed 03/16/20 showed persistent abscess and he required additional IR drain placement 03/17/20. Culture from initial drain placement 11/24 showed Enterobacter cloacae. Culture from subsequent drain placement 03/17/20 grew rare Enterobacter.  He presents to IR drain clinic today for folllow-up and management of his thigh abscess drains.   [name] presents today accompanied by his daughter.  He has been living at a facility who is caring for the drain.Marland Kitchen  He is not sure if they are flushing the drain daily, however he reports daily increased output of thick, pink-tinged fluid. Denies fever, chills, leg pain, or swelling.  He does continue IV antibiotics through his CVC.   Past Medical History:  Diagnosis Date  . Arthritis   . Asthma    no inhaler  . Coronary artery disease    cardiologist-  dr spruill; last visit 3 mos ago per pt  . GERD (gastroesophageal reflux disease)   . History of MI (myocardial infarction)    1985  . Hydronephrosis, left   . Hypertension   . Myocardial infarction ([place])   . Nephrolithiasis    left  . 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 Presence of tooth-root and mandibular implants    lower dental implants  . Prostate  cancer ([place])   . Shortness of breath    WITH EXERTION  . Type 2 diabetes mellitus ([place])     Past Surgical History:  Procedure Laterality Date  . BUNIONECTOMY    . CYSTOSCOPY W/ RETROGRADES Left 03/14/2014   Procedure: CYSTOSCOPY WITH LEFT RETROGRADE PYELOGRAM, Left Ureteroscopy, Lweft ureteral Stent No string;  Surgeon: [name], MD;  Location: [facility];  Service: Urology;  Laterality: Left;  . CYSTOSCOPY W/ URETERAL STENT PLACEMENT Bilateral 01/15/2020   Procedure: CYSTOSCOPY WITH RETROGRADE PYELOGRAM/URETERAL STENT PLACEMENT;  Surgeon: Alexis Frock, MD;  Location: [facility];  Service: Urology;  Laterality: Bilateral;  . CYSTOSCOPY/URETEROSCOPY/HOLMIUM LASER/STENT PLACEMENT Bilateral 03/17/2020   Procedure: CYSTOSCOPY/URETEROSCOPY/HOLMIUM LASER/STENT LEFT PLACEMENT;  Surgeon: Festus Aloe, MD;  Location: [facility];  Service: Urology;  Laterality: Bilateral;  . HERNIA REPAIR    . INCISION AND DRAINAGE Left 03/09/2020   Procedure: INCISION AND DRAINAGE LEFT HIP WITH LINER EXCHANGE;  Surgeon: Gaynelle Arabian, MD;  Location: [facility];  Service: Orthopedics;  Laterality: Left;  . IR FLUORO GUIDE CV LINE RIGHT  03/10/2020  . IR US GUIDE BX ASP/DRAIN  03/17/2020  . IR US GUIDE VASC ACCESS RIGHT  03/10/2020  . PROSTATE BIOPSY    . TOTAL HIP ARTHROPLASTY Left 03-20-2009  . TOTAL HIP ARTHROPLASTY  04/09/2012   Procedure: TOTAL HIP ARTHROPLASTY;  Surgeon: [name], MD;  Location: [facility];  Service: Orthopedics;  Laterality: Right;  . TOTAL KNEE ARTHROPLASTY Left  05-16-2008    Allergies: Shellfish allergy  Medications: Prior to Admission medications   Medication Sig Start Date End Date Taking? Authorizing Provider  albuterol (VENTOLIN HFA) 108 (90 Base) MCG/ACT inhaler Inhale 2 puffs into the lungs every 6 (six) hours as needed for wheezing or shortness of breath. 08/09/19 03/03/20  Luetta Nutting, DO  amiodarone (PACERONE) 200 MG tablet Take 1 tablet (200 mg total) by  mouth daily. 03/21/20   Caren Griffins, MD  Aspirin 81 MG CAPS Take 81 mg by mouth daily.     [provider]  atorvastatin (LIPITOR) 40 MG tablet Take 1 tablet (40 mg total) by mouth daily. 06/26/16   Orson Eva, MD  ceFEPime (MAXIPIME) IVPB Inject 2 g into the vein every 8 (eight) hours. Indication:  Prosthetic joint infection of left hip First Dose: Yes Last Day of Therapy:  04/20/2020 Labs - Once weekly:  CBC/D and BMP, Labs - Every other week:  ESR and CRP Method of administration: IV Push Method of administration may be changed at the discretion of home infusion pharmacist based upon assessment of the patient and/or caregiver's ability to self-administer the medication ordered. 03/20/20 04/21/20  Caren Griffins, MD  co-enzyme Q-10 30 MG capsule Take 30 mg by mouth daily.    [provider]  Continuous Blood Gluc Receiver (FREESTYLE LIBRE 14 DAY READER) [place] See admin instructions. 05/23/19   [provider]  Continuous Blood Gluc Sensor (FREESTYLE LIBRE 14 DAY SENSOR) MISC 1 Device by Does not apply route every 14 (fourteen) days. 05/23/19   Luetta Nutting, DO  cycloSPORINE (RESTASIS) 0.05 % ophthalmic emulsion Place 1 drop into both eyes 2 (two) times daily.  09/23/19   [provider]  diclofenac Sodium (VOLTAREN) 1 % GEL Apply 4 g topically 4 (four) times daily. 03/01/20   Deno Etienne, DO  diltiazem (CARDIZEM CD) 120 MG 24 hr capsule Take 1 capsule (120 mg total) by mouth daily. 03/21/20   Caren Griffins, MD  docusate sodium (COLACE) 100 MG capsule Take 100 mg by mouth daily as needed for mild constipation.    [provider]  ferrous sulfate 325 (65 FE) MG tablet Take 325 mg by mouth daily with breakfast.    [provider]  gabapentin (NEURONTIN) 300 MG capsule Take 1 capsule (300 mg total) by mouth 3 (three) times daily. Start 357m at bedtime x1 week then may increase to three times per day. Patient taking differently: Take 300  mg by mouth in the morning, at noon, and at bedtime.  10/21/19   MLuetta Nutting DO  glimepiride (AMARYL) 4 MG tablet Take 4 mg by mouth daily before breakfast.    [provider]  metoprolol tartrate (LOPRESSOR) 25 MG tablet Take 1 tablet (25 mg total) by mouth 2 (two) times daily. 03/20/20   GCaren Griffins MD  omeprazole (PRILOSEC) 40 MG capsule Take 1 capsule (40 mg total) by mouth 2 (two) times daily. X 1 month, then once daily Patient not taking: Reported on 03/03/2020 06/26/16   TOrson Eva MD  oxyCODONE-acetaminophen (PERCOCET/ROXICET) 5-325 MG tablet Take 1 tablet by mouth every 4 (four) hours as needed for moderate pain. 03/20/20   GCaren Griffins MD  sitaGLIPtin (JANUVIA) 50 MG tablet Take 1 tablet (50 mg total) by mouth daily. 06/26/16   TOrson Eva MD  spironolactone (ALDACTONE) 25 MG tablet Take 25 mg by mouth daily. 02/28/20   [provider]     Family History  Problem  Relation Age of Onset  . Cancer Mother        breast  . Multiple sclerosis Daughter   . Cancer Father        prostate  . Cancer Maternal Uncle        bone    Social History   Socioeconomic History  . Marital status: Widowed    Spouse name: Not on file  . Number of children: Not on file  . Years of education: Not on file  . Highest education level: Not on file  Occupational History  . Not on file  Tobacco Use  . Smoking status: Former Smoker    Packs/day: 1.00    Years: 25.00    Pack years: 25.00    Types: Cigarettes    Quit date: 04/11/1984    Years since quitting: 35.9  . Smokeless tobacco: Former Systems developer    Quit date: 04/02/1985  Vaping Use  . Vaping Use: Never used  Substance and Sexual Activity  . Alcohol use: Yes    Comment: RARE  . Drug use: No  . Sexual activity: Not Currently  Other Topics Concern  . Not on file  Social History Narrative  . Not on file   Social Determinants of Health   Financial Resource Strain: Not on file  Food Insecurity: No Food  Insecurity  . Worried About Charity fundraiser in the Last Year: Never true  . Ran Out of Food in the Last Year: Never true  Transportation Needs: No Transportation Needs  . Lack of Transportation (Medical): No  . Lack of Transportation (Non-Medical): No  Physical Activity: Not on file  Stress: Not on file  Social Connections: Not on file     Vital Signs: There were no vitals taken for this visit.  Physical Exam  NAD, alert Thigh:  Left-sided, anterior drain in place.  Insert site intact.  Dressing in place. Thick, pink purulent-appearing output in bulb. Surgical incision noted on left lateral hip. No drainage.   Imaging: No results found.  Labs:  CBC: Recent Labs    03/18/20 0350 03/19/20 0450 03/20/20 0300 03/21/20 0403  WBC 5.4 7.0 7.6 8.4  HGB 9.2* 7.7* 9.0* 9.3*  HCT 28.6* 24.5* 28.2* 29.0*  PLT 349 310 352 260    COAGS: Recent Labs    03/04/20 1252  INR 1.1    BMP: Recent Labs    10/24/19 0807 12/06/19 0736 01/06/20 0751 01/13/20 0716 01/14/20 1027 03/17/20 0541 03/18/20 0350 03/19/20 0450 03/20/20 0300  NA 135 137 136 136   < > 138 138 139 138  K 4.8 5.4* 5.3 6.4*   < > 4.7 5.4* 4.6 4.7  CL 109 109 108 110   < > 110 110 110 110  CO2 18* 21 20 19*   < > 21* 18* 22 21*  GLUCOSE 145* 152* 107* 58*   < > 142* 287* 176* 178*  BUN 33* 43* 55* 52*   < > _0 CALCIUM 9.0 9.0 9.0 8.9   < > 7.6* 8.1* 7.6* 7.5*  CREATININE 1.62* 2.02* 3.11* 3.31*   < > 1.28* 1.27* 1.25* 1.14  GFRNONAA 41* 32* 19* 17*   < > 59* 59* >60 >60  GFRAA 48* 37* 22* 20*  --   --   --   --   --    < > = values in this interval not displayed.    LIVER FUNCTION TESTS: Recent Labs  03/09/20 0419 03/10/20 0633 03/12/20 0450 03/19/20 0450  BILITOT 0.6 0.9 0.5 0.4  AST 44* 76* 35 14*  ALT 33 67* 49* 18  ALKPHOS 94 97 83 56  PROT 7.1 6.9 6.0* 5.5*  ALBUMIN 2.3* 2.4* 2.3* 1.9*    Assessment: Thigh abscess s/p IR drain placement x2 (11/24 by Dr. Pascal Lux, 12/7 by  Dr. Annamaria Boots), currently with solitary left anterior thigh abscess drain in place.  [name] presents to IR drain clinic today doing fairly well. He has no complaints related to his drain, however reports significant ongoing output.  He does lives in a facility where he has assistance with drain management.  He continues IV antibiotics via tunneled CVC and continues his TID regimen.  Patient and daughter are unsure if he is getting regular flushes at the facility, however he has adequate drainage of his fluid collection.  CT Hip performed today and reviewed by Dr. Anselm Pancoast. There is artifact from the hip arthroplasty and therefore additional US imaging was performed by Dr. Anselm Pancoast.  Please see attestation from Dr. Anselm Pancoast regarding US findings and care plan.  Patient was scheduled for 2 week follow-up with US imaging.  He understands to keep this appointment if the drain remains and there are no interval changes in his care (I.e. re-exploration/evacuation of hip abscess, drain removal with Ortho, etc... ).  All of the above was discussed with patient and his daughter.   Signed: Docia Barrier, PA 04/01/2020, 1:23 PM   Please refer to Dr. Anselm Pancoast attestation of this note for management and plan.

## 2020-04-01 NOTE — Patient Outreach (Signed)
  [facility]) Care Management Chronic Special Needs Program    04/01/2020  Name: [name], DOB: September 27, 1945  MRN: [id number]   [name] is enrolled in a chronic special needs plan for Diabetes. [facility] Management will continue to provide services for this member through 04/10/2020. The [facility] Care Management Team will assume care 04/11/2020.   Thea Silversmith, RN, MSN, [place] [phone]  .

## 2020-04-06 DIAGNOSIS — M10041 Idiopathic gout, right hand: Secondary | ICD-10-CM | POA: Diagnosis not present

## 2020-04-06 DIAGNOSIS — R600 Localized edema: Secondary | ICD-10-CM | POA: Diagnosis not present

## 2020-04-06 DIAGNOSIS — M25562 Pain in left knee: Secondary | ICD-10-CM | POA: Diagnosis not present

## 2020-04-06 DIAGNOSIS — M25541 Pain in joints of right hand: Secondary | ICD-10-CM | POA: Diagnosis not present

## 2020-04-08 DIAGNOSIS — R0989 Other specified symptoms and signs involving the circulatory and respiratory systems: Secondary | ICD-10-CM | POA: Diagnosis not present

## 2020-04-08 DIAGNOSIS — M10041 Idiopathic gout, right hand: Secondary | ICD-10-CM | POA: Diagnosis not present

## 2020-04-08 DIAGNOSIS — M25562 Pain in left knee: Secondary | ICD-10-CM | POA: Diagnosis not present

## 2020-04-08 DIAGNOSIS — R059 Cough, unspecified: Secondary | ICD-10-CM | POA: Diagnosis not present

## 2020-04-13 ENCOUNTER — Other Ambulatory Visit: Payer: Self-pay

## 2020-04-13 ENCOUNTER — Ambulatory Visit (INDEPENDENT_AMBULATORY_CARE_PROVIDER_SITE_OTHER): Payer: HMO | Admitting: Infectious Disease

## 2020-04-13 ENCOUNTER — Telehealth: Payer: Self-pay

## 2020-04-13 ENCOUNTER — Encounter: Payer: Self-pay | Admitting: Infectious Disease

## 2020-04-13 VITALS — BP 187/98 | HR 96 | Temp 97.6°F | Ht 66.0 in | Wt 241.0 lb

## 2020-04-13 DIAGNOSIS — T8459XA Infection and inflammatory reaction due to other internal joint prosthesis, initial encounter: Secondary | ICD-10-CM

## 2020-04-13 DIAGNOSIS — T8452XD Infection and inflammatory reaction due to internal left hip prosthesis, subsequent encounter: Secondary | ICD-10-CM

## 2020-04-13 DIAGNOSIS — Z96649 Presence of unspecified artificial hip joint: Secondary | ICD-10-CM | POA: Insufficient documentation

## 2020-04-13 DIAGNOSIS — N189 Chronic kidney disease, unspecified: Secondary | ICD-10-CM

## 2020-04-13 DIAGNOSIS — E11649 Type 2 diabetes mellitus with hypoglycemia without coma: Secondary | ICD-10-CM | POA: Diagnosis not present

## 2020-04-13 DIAGNOSIS — A4159 Other Gram-negative sepsis: Secondary | ICD-10-CM | POA: Diagnosis not present

## 2020-04-13 DIAGNOSIS — T8459XD Infection and inflammatory reaction due to other internal joint prosthesis, subsequent encounter: Secondary | ICD-10-CM

## 2020-04-13 DIAGNOSIS — R059 Cough, unspecified: Secondary | ICD-10-CM

## 2020-04-13 DIAGNOSIS — R7881 Bacteremia: Secondary | ICD-10-CM | POA: Diagnosis not present

## 2020-04-13 DIAGNOSIS — N183 Chronic kidney disease, stage 3 unspecified: Secondary | ICD-10-CM | POA: Diagnosis not present

## 2020-04-13 DIAGNOSIS — B9689 Other specified bacterial agents as the cause of diseases classified elsewhere: Secondary | ICD-10-CM | POA: Diagnosis not present

## 2020-04-13 DIAGNOSIS — L02415 Cutaneous abscess of right lower limb: Secondary | ICD-10-CM

## 2020-04-13 DIAGNOSIS — N1832 Chronic kidney disease, stage 3b: Secondary | ICD-10-CM | POA: Diagnosis not present

## 2020-04-13 DIAGNOSIS — M169 Osteoarthritis of hip, unspecified: Secondary | ICD-10-CM | POA: Diagnosis not present

## 2020-04-13 DIAGNOSIS — L02419 Cutaneous abscess of limb, unspecified: Secondary | ICD-10-CM

## 2020-04-13 DIAGNOSIS — I480 Paroxysmal atrial fibrillation: Secondary | ICD-10-CM | POA: Diagnosis not present

## 2020-04-13 HISTORY — DX: Infection and inflammatory reaction due to other internal joint prosthesis, initial encounter: Z96.649

## 2020-04-13 HISTORY — DX: Cutaneous abscess of limb, unspecified: L02.419

## 2020-04-13 HISTORY — DX: Chronic kidney disease, unspecified: N18.9

## 2020-04-13 HISTORY — DX: Presence of unspecified artificial hip joint: T84.59XA

## 2020-04-13 HISTORY — DX: Other gram-negative sepsis: A41.59

## 2020-04-13 MED ORDER — SULFAMETHOXAZOLE-TRIMETHOPRIM 800-160 MG PO TABS: 1.0000 | ORAL_TABLET | Freq: Two times a day (BID) | ORAL | 5 refills | Status: DC

## 2020-04-13 NOTE — Progress Notes (Signed)
Subjective:  Chief complaint follow-up for prosthetic joint infection also having cough for the last several weeks  Patient ID: [name], male    DOB: [birth year], 75 y.o.   MRN: [id number]  HPI PMHx of CAD, DM2, HTN, CKD, OA, s/p left hip arthoplasty (2001), prostate cancer, PAF, left ureteral stone s/p stenting (01/2020) who was sent from ortho clinic on 11/23 by Dr Wynelle Link. He is growing Enterobacter from blood, urine and hip aspirate. He is sp surgery with Irrigation and debridement, left hip, with bearing surface exchange. Enterobacter grew on cultures.  We had him scheduled to complete antibiotics on 10 January.  In the interim he developed a fluid collection in his thigh that was drained and seemed rather bloody but which also grew Enterobacter from culture.  Drain is still in place.  He says the hip pain is dramatically improved.  He was residing at a skilled nursing facility where he says he developed "this cough."  He said he tested negative for Covid 19 repeatedly.  He still has the drain in place with some bloody material from it.   Past Medical History:  Diagnosis Date  . Arthritis   . Asthma    no inhaler  . Coronary artery disease    cardiologist-  dr spruill; last visit 3 mos ago per pt  . GERD (gastroesophageal reflux disease)   . History of MI (myocardial infarction)    1985  . Hydronephrosis, left   . Hypertension   . Myocardial infarction ([place])   . Nephrolithiasis    left  . 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 Presence of tooth-root and mandibular implants    lower dental implants  . Prostate cancer ([place])   . Shortness of breath    WITH EXERTION  . Type 2 diabetes mellitus ([place])     Past Surgical History:  Procedure Laterality Date  . BUNIONECTOMY    . CYSTOSCOPY W/ RETROGRADES Left 03/14/2014   Procedure: CYSTOSCOPY WITH LEFT RETROGRADE PYELOGRAM, Left Ureteroscopy, Lweft ureteral Stent No string;  Surgeon: [name],  MD;  Location: [facility];  Service: Urology;  Laterality: Left;  . CYSTOSCOPY W/ URETERAL STENT PLACEMENT Bilateral 01/15/2020   Procedure: CYSTOSCOPY WITH RETROGRADE PYELOGRAM/URETERAL STENT PLACEMENT;  Surgeon: Alexis Frock, MD;  [facility];  Service: Urology;  Laterality: Bilateral;  . CYSTOSCOPY/URETEROSCOPY/HOLMIUM LASER/STENT PLACEMENT Bilateral 03/17/2020   Procedure: CYSTOSCOPY/URETEROSCOPY/HOLMIUM LASER/STENT LEFT PLACEMENT;  Surgeon: Festus Aloe, MD;  Location: [facility];  Service: Urology;  Laterality: Bilateral;  . HERNIA REPAIR    . INCISION AND DRAINAGE Left 03/09/2020   Procedure: INCISION AND DRAINAGE LEFT HIP WITH LINER EXCHANGE;  Surgeon: Gaynelle Arabian, MD;  Location: [facility];  Service: Orthopedics;  Laterality: Left;  . IR FLUORO GUIDE CV LINE RIGHT  03/10/2020  . IR RADIOLOGIST EVAL & MGMT  04/01/2020  . IR US GUIDE BX ASP/DRAIN  03/17/2020  . IR US GUIDE VASC ACCESS RIGHT  03/10/2020  . PROSTATE BIOPSY    . TOTAL HIP ARTHROPLASTY Left 03-20-2009  . TOTAL HIP ARTHROPLASTY  04/09/2012   Procedure: TOTAL HIP ARTHROPLASTY;  Surgeon: [name], MD;  [facility];  Service: Orthopedics;  Laterality: Right;  . TOTAL KNEE ARTHROPLASTY Left 05-16-2008    Family History  Problem Relation Age of Onset  . Cancer Mother        breast  . Multiple sclerosis Daughter   . Cancer Father  prostate  . Cancer Maternal Uncle        bone      Social History   Socioeconomic History  . Marital status: Widowed    Spouse name: Not on file  . Number of children: Not on file  . Years of education: Not on file  . Highest education level: Not on file  Occupational History  . Not on file  Tobacco Use  . Smoking status: Former Smoker    Packs/day: 1.00    Years: 25.00    Pack years: 25.00    Types: Cigarettes    Quit date: 04/11/1984    Years since quitting: 36.0  . Smokeless tobacco: Former Systems developer    Quit date: 04/02/1985  Vaping Use  .  Vaping Use: Never used  Substance and Sexual Activity  . Alcohol use: Not Currently    Comment: RARE  . Drug use: No  . Sexual activity: Not Currently  Other Topics Concern  . Not on file  Social History Narrative  . Not on file   Social Determinants of Health   Financial Resource Strain: Not on file  Food Insecurity: No Food Insecurity  . Worried About Charity fundraiser in the Last Year: Never true  . Ran Out of Food in the Last Year: Never true  Transportation Needs: No Transportation Needs  . Lack of Transportation (Medical): No  . Lack of Transportation (Non-Medical): No  Physical Activity: Not on file  Stress: Not on file  Social Connections: Not on file    Allergies  Allergen Reactions  . Shellfish Allergy Rash     Current Outpatient Medications:  .  albuterol (VENTOLIN HFA) 108 (90 Base) MCG/ACT inhaler, Inhale 2 puffs into the lungs every 6 (six) hours as needed for wheezing or shortness of breath., Disp: 6.7 g, Rfl: 6 .  amiodarone (PACERONE) 200 MG tablet, Take 1 tablet (200 mg total) by mouth daily., Disp: , Rfl:  .  Aspirin 81 MG CAPS, Take 81 mg by mouth daily. , Disp: , Rfl:  .  atorvastatin (LIPITOR) 40 MG tablet, Take 1 tablet (40 mg total) by mouth daily., Disp: 30 tablet, Rfl: 0 .  ceFEPime (MAXIPIME) IVPB, Inject 2 g into the vein every 8 (eight) hours. Indication:  Prosthetic joint infection of left hip First Dose: Yes Last Day of Therapy:  04/20/2020 Labs - Once weekly:  CBC/D and BMP, Labs - Every other week:  ESR and CRP Method of administration: IV Push Method of administration may be changed at the discretion of home infusion pharmacist based upon assessment of the patient and/or caregiver's ability to self-administer the medication ordered., Disp: 96 Units, Rfl: 0 .  co-enzyme Q-10 30 MG capsule, Take 30 mg by mouth daily., Disp: , Rfl:  .  Continuous Blood Gluc Receiver (FREESTYLE LIBRE 54 DAY READER) [place], See admin instructions., Disp: , Rfl:  .   Continuous Blood Gluc Sensor (FREESTYLE LIBRE 14 DAY SENSOR) MISC, 1 Device by Does not apply route every 14 (fourteen) days., Disp: 6 each, Rfl: 1 .  cycloSPORINE (RESTASIS) 0.05 % ophthalmic emulsion, Place 1 drop into both eyes 2 (two) times daily. , Disp: , Rfl:  .  diclofenac Sodium (VOLTAREN) 1 % GEL, Apply 4 g topically 4 (four) times daily., Disp: 100 g, Rfl: 0 .  diltiazem (CARDIZEM CD) 120 MG 24 hr capsule, Take 1 capsule (120 mg total) by mouth daily., Disp: , Rfl:  .  docusate sodium (COLACE) 100 MG capsule,  Take 100 mg by mouth daily as needed for mild constipation., Disp: , Rfl:  .  ferrous sulfate 325 (65 FE) MG tablet, Take 325 mg by mouth daily with breakfast., Disp: , Rfl:  .  gabapentin (NEURONTIN) 300 MG capsule, Take 1 capsule (300 mg total) by mouth 3 (three) times daily. Start 326m at bedtime x1 week then may increase to three times per day. (Patient taking differently: Take 300 mg by mouth in the morning, at noon, and at bedtime.), Disp: 90 capsule, Rfl: 3 .  glimepiride (AMARYL) 4 MG tablet, Take 4 mg by mouth daily before breakfast., Disp: , Rfl:  .  guaiFENesin (ROBITUSSIN) 100 MG/5ML liquid, Take 200 mg by mouth 4 (four) times daily as needed for cough., Disp: , Rfl:  .  omeprazole (PRILOSEC) 40 MG capsule, Take 1 capsule (40 mg total) by mouth 2 (two) times daily. X 1 month, then once daily, Disp: 60 capsule, Rfl: 1 .  oxyCODONE-acetaminophen (PERCOCET/ROXICET) 5-325 MG tablet, Take 1 tablet by mouth every 4 (four) hours as needed for moderate pain., Disp: 5 tablet, Rfl: 0 .  sitaGLIPtin (JANUVIA) 50 MG tablet, Take 1 tablet (50 mg total) by mouth daily., Disp: 30 tablet, Rfl: 0 .  spironolactone (ALDACTONE) 25 MG tablet, Take 25 mg by mouth daily., Disp: , Rfl:  .  metoprolol tartrate (LOPRESSOR) 25 MG tablet, Take 1 tablet (25 mg total) by mouth 2 (two) times daily. (Patient not taking: Reported on 04/13/2020), Disp: , Rfl:    Review of Systems  Constitutional: Negative  for activity change, appetite change, chills, diaphoresis, fatigue, fever and unexpected weight change.  HENT: Negative for congestion, rhinorrhea, sinus pressure, sneezing, sore throat and trouble swallowing.   Eyes: Negative for photophobia and visual disturbance.  Respiratory: Positive for cough. Negative for chest tightness, shortness of breath, wheezing and stridor.   Cardiovascular: Negative for chest pain, palpitations and leg swelling.  Gastrointestinal: Negative for abdominal distention, abdominal pain, anal bleeding, blood in stool, constipation, diarrhea, nausea and vomiting.  Genitourinary: Negative for difficulty urinating, dysuria, flank pain and hematuria.  Musculoskeletal: Positive for myalgias. Negative for arthralgias, back pain, gait problem and joint swelling.  Skin: Positive for wound. Negative for color change, pallor and rash.  Neurological: Negative for dizziness, tremors, weakness and light-headedness.  Hematological: Negative for adenopathy. Does not bruise/bleed easily.  Psychiatric/Behavioral: Negative for agitation, behavioral problems, confusion, decreased concentration, dysphoric mood and sleep disturbance.       Objective:   Physical Exam Constitutional:      General: He is not in acute distress.    Appearance: Normal appearance. He is well-developed and well-nourished. He is not ill-appearing or diaphoretic.  HENT:     Head: Normocephalic and atraumatic.     Right Ear: Hearing and external ear normal.     Left Ear: Hearing and external ear normal.     Nose: No nasal deformity, rhinorrhea or epistaxis.  Eyes:     General: No scleral icterus.    Extraocular Movements: EOM normal.     Conjunctiva/sclera: Conjunctivae normal.     Right eye: Right conjunctiva is not injected.     Left eye: Left conjunctiva is not injected.     Pupils: Pupils are equal, round, and reactive to light.  Neck:     Vascular: No JVD.  Cardiovascular:     Rate and Rhythm: Normal  rate and regular rhythm.     Heart sounds: S1 normal and S2 normal. No murmur heard.  Pulmonary:     Effort: Pulmonary effort is normal. No respiratory distress.  Abdominal:     General: There is no distension or ascites.     Palpations: Abdomen is soft. There is no hepatosplenomegaly.     Tenderness: There is no abdominal tenderness.  Musculoskeletal:        General: Normal range of motion.     Right shoulder: Normal.     Left shoulder: Normal.     Cervical back: Normal range of motion and neck supple.     Right hip: Normal.     Left hip: Normal.     Right knee: Normal.     Left knee: Normal.  Lymphadenopathy:     Head:     Right side of head: No submandibular, preauricular or posterior auricular adenopathy.     Left side of head: No submandibular, preauricular or posterior auricular adenopathy.     Cervical: No cervical adenopathy.     Right cervical: No superficial or deep cervical adenopathy.    Left cervical: No superficial or deep cervical adenopathy.  Skin:    General: Skin is warm, dry and intact.     Coloration: Skin is not pale.     Findings: No abrasion, bruising, ecchymosis, erythema, lesion or rash.     Nails: There is no clubbing or cyanosis.  Neurological:     General: No focal deficit present.     Mental Status: He is alert and oriented to person, place, and time.     Sensory: No sensory deficit.     Coordination: Coordination normal.     Gait: Gait normal.     Deep Tendon Reflexes: Strength normal.  Psychiatric:        Attention and Perception: He is attentive.        Mood and Affect: Mood and affect and mood normal.        Speech: Speech normal.        Behavior: Behavior normal. Behavior is cooperative.        Thought Content: Thought content normal.        Cognition and Memory: Cognition and memory normal.        Judgment: Judgment normal.    Central line in place and clean dry and intact April 13, 2020:    JP drain with some bloody material  present      Assessment & Plan:  Prosthetic joint infection status post exchange of part of the prosthesis with Enterobacter isolated:  Complete cefepime and then have central line pulled start Bactrim double strength tablet twice daily and check BMP 1 week after being on therapy to ensure he is not develop worsening creatinine or potassium.  Hopefully can use Bactrim as the were course to get him through a years worth of therapy.  I really do not want to have to use a fluoroquinolone pictured.  Chronic kidney disease improved while in the hospital and hopefully can tolerate Bactrim once he comes off the cefepime here.  Fluid collection in the thigh what looked like a hematoma did grow the same bacteria seems to be resolving at this point in time."  Cough: claims to have been tested for COVID 19 and getting better  I spent greater than 40 minutes with the patient including greater than 50% of time in face to face counsel of the patient and his caregiver regarding nature of prosthetic joint infections and how we manage him with IV followed by oral antibiotics and in coordination of  his care.

## 2020-04-13 NOTE — Telephone Encounter (Signed)
Left message for [name] in IR requesting call back to schedule removal of patient's tunneled CVC. Per Dr. Daiva Eves, can be removed 04/21/20. Will need to call patient with appointment time.   Sandie Ano, RN

## 2020-04-14 NOTE — Telephone Encounter (Signed)
Spoke with patient to relay appointment time for tunneled CVC removal. Advised patient that his appointment is at [facility] radiology on the 1st floor on 04/22/20 at 10am, and to arrive at 9:45am. Patient verbalized understanding and has no further questions.   Sandie Ano, RN

## 2020-04-15 ENCOUNTER — Ambulatory Visit
Admission: RE | Admit: 2020-04-15 | Discharge: 2020-04-15 | Disposition: A | Discharge status: Home / Self Care | Payer: HMO | Source: Ambulatory Visit | Attending: Diagnostic Radiology | Admitting: Diagnostic Radiology

## 2020-04-15 ENCOUNTER — Other Ambulatory Visit: Payer: Self-pay | Admitting: Diagnostic Radiology

## 2020-04-15 ENCOUNTER — Encounter: Payer: Self-pay | Admitting: *Deleted

## 2020-04-15 DIAGNOSIS — Z978 Presence of other specified devices: Secondary | ICD-10-CM | POA: Diagnosis not present

## 2020-04-15 DIAGNOSIS — J45909 Unspecified asthma, uncomplicated: Secondary | ICD-10-CM | POA: Diagnosis not present

## 2020-04-15 DIAGNOSIS — L02416 Cutaneous abscess of left lower limb: Secondary | ICD-10-CM | POA: Diagnosis not present

## 2020-04-15 DIAGNOSIS — L02419 Cutaneous abscess of limb, unspecified: Secondary | ICD-10-CM

## 2020-04-15 DIAGNOSIS — M009 Pyogenic arthritis, unspecified: Secondary | ICD-10-CM | POA: Diagnosis not present

## 2020-04-15 DIAGNOSIS — I25118 Atherosclerotic heart disease of native coronary artery with other forms of angina pectoris: Secondary | ICD-10-CM | POA: Diagnosis not present

## 2020-04-15 DIAGNOSIS — Z87828 Personal history of other (healed) physical injury and trauma: Secondary | ICD-10-CM | POA: Diagnosis not present

## 2020-04-15 DIAGNOSIS — R2242 Localized swelling, mass and lump, left lower limb: Secondary | ICD-10-CM | POA: Diagnosis not present

## 2020-04-15 DIAGNOSIS — N200 Calculus of kidney: Secondary | ICD-10-CM | POA: Diagnosis not present

## 2020-04-15 HISTORY — PX: IR RADIOLOGIST EVAL & MGMT: IMG5224

## 2020-04-15 NOTE — Progress Notes (Signed)
Chief Complaint: Patient was seen in consultation today for left thigh drain management  Referring Physician(s): Tommy Medal; Alusio  History of Present Illness: [name] is a 75 y.o. male with history of left hip septic arthritis/prosthetic joint infection.  Patient has a left thigh fluid collection which grew Enterobacter.  Drain was placed on 03/17/2020.  Patient is being followed by orthopedic surgery and infectious disease.  Patient is currently getting intravenous antibiotics through a Port-A-Cath.  Patient is scheduled to complete cefepime and then start Bactrim.  With regards to the left thigh drain, the patient has not been recording output.  He says that the output is decreasing.  There is 75 mL of turbid red fluid in the collection bulb today.  Patient says that the bulb was last drained 2 days ago.  He reports pain and irritation from the retention suture.  He notes mild drainage around the tube.  He complains of lower extremity swelling which is not new.  Patient is currently at home living by himself.  He says that a nurse comes out to help with the IV antibiotics.  Past Medical History:  Diagnosis Date  . Arthritis   . Asthma    no inhaler  . Coronary artery disease    cardiologist-  dr spruill; last visit 3 mos ago per pt  . Enterobacter sepsis ([place]) 04/13/2020  . GERD (gastroesophageal reflux disease)   . History of MI (myocardial infarction)    1985  . Hydronephrosis, left   . Hypertension   . Myocardial infarction ([place])   . Nephrolithiasis    left  . 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 Presence of tooth-root and mandibular implants    lower dental implants  . Prostate cancer ([place])   . 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 Shortness of breath    WITH EXERTION  . Thigh abscess 04/13/2020  . Type 2 diabetes mellitus ([place])     Past Surgical History:  Procedure Laterality Date  . BUNIONECTOMY    . CYSTOSCOPY W/ RETROGRADES Left  03/14/2014   Procedure: CYSTOSCOPY WITH LEFT RETROGRADE PYELOGRAM, Left Ureteroscopy, [place] ureteral Stent No string;  Surgeon: [name], MD;  Location: [facility];  Service: Urology;  Laterality: Left;  . CYSTOSCOPY W/ URETERAL STENT PLACEMENT Bilateral 01/15/2020   Procedure: CYSTOSCOPY WITH RETROGRADE PYELOGRAM/URETERAL STENT PLACEMENT;  Surgeon: Alexis Frock, MD;  Location: [facility];  Service: Urology;  Laterality: Bilateral;  . CYSTOSCOPY/URETEROSCOPY/HOLMIUM LASER/STENT PLACEMENT Bilateral 03/17/2020   Procedure: CYSTOSCOPY/URETEROSCOPY/HOLMIUM LASER/STENT LEFT PLACEMENT;  Surgeon: Festus Aloe, MD;  Location: [facility];  Service: Urology;  Laterality: Bilateral;  . HERNIA REPAIR    . INCISION AND DRAINAGE Left 03/09/2020   Procedure: INCISION AND DRAINAGE LEFT HIP WITH LINER EXCHANGE;  Surgeon: Gaynelle Arabian, MD;  Location: [facility];  Service: Orthopedics;  Laterality: Left;  . IR FLUORO GUIDE CV LINE RIGHT  03/10/2020  . IR RADIOLOGIST EVAL & MGMT  04/01/2020  . IR RADIOLOGIST EVAL & MGMT  04/15/2020  . IR US GUIDE BX ASP/DRAIN  03/17/2020  . IR US GUIDE VASC ACCESS RIGHT  03/10/2020  . PROSTATE BIOPSY    . TOTAL HIP ARTHROPLASTY Left 03-20-2009  . TOTAL HIP ARTHROPLASTY  04/09/2012   Procedure: TOTAL HIP ARTHROPLASTY;  Surgeon: [name], MD;  Location: [facility];  Service: Orthopedics;  Laterality: Right;  . TOTAL KNEE ARTHROPLASTY Left 05-16-2008    Allergies: Shellfish allergy  Medications:  Prior to Admission medications   Medication Sig Start Date End Date Taking? Authorizing Provider  albuterol (VENTOLIN HFA) 108 (90 Base) MCG/ACT inhaler Inhale 2 puffs into the lungs every 6 (six) hours as needed for wheezing or shortness of breath. 08/09/19 03/03/20  Luetta Nutting, DO  amiodarone (PACERONE) 200 MG tablet Take 1 tablet (200 mg total) by mouth daily. 03/21/20   Caren Griffins, MD  Aspirin 81 MG CAPS Take 81 mg by mouth daily.     [provider]  atorvastatin (LIPITOR) 40 MG tablet Take 1 tablet (40 mg total) by mouth daily. 06/26/16   Orson Eva, MD  ceFEPime (MAXIPIME) IVPB Inject 2 g into the vein every 8 (eight) hours. Indication:  Prosthetic joint infection of left hip First Dose: Yes Last Day of Therapy:  04/20/2020 Labs - Once weekly:  CBC/D and BMP, Labs - Every other week:  ESR and CRP Method of administration: IV Push Method of administration may be changed at the discretion of home infusion pharmacist based upon assessment of the patient and/or caregiver's ability to self-administer the medication ordered. 03/20/20 04/21/20  Caren Griffins, MD  co-enzyme Q-10 30 MG capsule Take 30 mg by mouth daily.    [provider]  Continuous Blood Gluc Receiver (FREESTYLE LIBRE 14 DAY READER) [place] See admin instructions. 05/23/19   [provider]  Continuous Blood Gluc Sensor (FREESTYLE LIBRE 14 DAY SENSOR) MISC 1 Device by Does not apply route every 14 (fourteen) days. 05/23/19   Luetta Nutting, DO  cycloSPORINE (RESTASIS) 0.05 % ophthalmic emulsion Place 1 drop into both eyes 2 (two) times daily.  09/23/19   [provider]  diclofenac Sodium (VOLTAREN) 1 % GEL Apply 4 g topically 4 (four) times daily. 03/01/20   Deno Etienne, DO  diltiazem (CARDIZEM CD) 120 MG 24 hr capsule Take 1 capsule (120 mg total) by mouth daily. 03/21/20   Caren Griffins, MD  docusate sodium (COLACE) 100 MG capsule Take 100 mg by mouth daily as needed for mild constipation.    [provider]  ferrous sulfate 325 (65 FE) MG tablet Take 325 mg by mouth daily with breakfast.    [provider]  gabapentin (NEURONTIN) 300 MG capsule Take 1 capsule (300 mg total) by mouth 3 (three) times daily. Start 378m at bedtime x1 week then may increase to three times per day. Patient taking differently: Take 300 mg by mouth in the morning, at noon, and at bedtime. 10/21/19   MLuetta Nutting DO  glimepiride (AMARYL) 4 MG tablet Take  4 mg by mouth daily before breakfast.    [provider]  guaiFENesin (ROBITUSSIN) 100 MG/5ML liquid Take 200 mg by mouth 4 (four) times daily as needed for cough.    [provider]  metoprolol tartrate (LOPRESSOR) 25 MG tablet Take 1 tablet (25 mg total) by mouth 2 (two) times daily. Patient not taking: Reported on 04/13/2020 03/20/20   GCaren Griffins MD  omeprazole (PRILOSEC) 40 MG capsule Take 1 capsule (40 mg total) by mouth 2 (two) times daily. X 1 month, then once daily 06/26/16   Tat, DShanon Brow MD  oxyCODONE-acetaminophen (PERCOCET/ROXICET) 5-325 MG tablet Take 1 tablet by mouth every 4 (four) hours as needed for moderate pain. 03/20/20   GCaren Griffins MD  sitaGLIPtin (JANUVIA) 50 MG tablet Take 1 tablet (50 mg total) by mouth daily. 06/26/16   TOrson Eva MD  spironolactone (ALDACTONE) 25 MG tablet Take 25 mg by mouth  daily. 02/28/20   [provider]  sulfamethoxazole-trimethoprim (BACTRIM DS) 800-160 MG tablet Take 1 tablet by mouth 2 (two) times daily. 04/13/20   Truman Hayward, MD     Family History  Problem Relation Age of Onset  . Cancer Mother        breast  . Multiple sclerosis Daughter   . Cancer Father        prostate  . Cancer Maternal Uncle        bone    Social History   Socioeconomic History  . Marital status: Widowed    Spouse name: Not on file  . Number of children: Not on file  . Years of education: Not on file  . Highest education level: Not on file  Occupational History  . Not on file  Tobacco Use  . Smoking status: Former Smoker    Packs/day: 1.00    Years: 25.00    Pack years: 25.00    Types: Cigarettes    Quit date: 04/11/1984    Years since quitting: 36.0  . Smokeless tobacco: Former Systems developer    Quit date: 04/02/1985  Vaping Use  . Vaping Use: Never used  Substance and Sexual Activity  . Alcohol use: Not Currently    Comment: RARE  . Drug use: No  . Sexual activity: Not Currently  Other Topics Concern  . Not  on file  Social History Narrative  . Not on file   Social Determinants of Health   Financial Resource Strain: Not on file  Food Insecurity: No Food Insecurity  . Worried About Charity fundraiser in the Last Year: Never true  . Ran Out of Food in the Last Year: Never true  Transportation Needs: No Transportation Needs  . Lack of Transportation (Medical): No  . Lack of Transportation (Non-Medical): No  Physical Activity: Not on file  Stress: Not on file  Social Connections: Not on file      Review of Systems  Constitutional: Negative for chills and fever.  Cardiovascular: Positive for leg swelling.    Vital Signs: There were no vitals taken for this visit.  Physical Exam Constitutional:      Appearance: He is not ill-appearing.  Musculoskeletal:     Comments: Left anterior thigh drain is intact.  Suture was still intact.  Minimal drainage around the tube.  The StatLock was still present but only partially intact.  Skin:    Comments: The retention suture was cut.  New StatLock was placed.  Gauze and tape were placed over the drain site.  Neurological:     Mental Status: He is alert.        Imaging: CT HIP LEFT WO CONTRAST  Result Date: 04/01/2020 CLINICAL DATA:  Left periprosthetic hip infection status post drainage. EXAM: CT OF THE LEFT HIP WITHOUT CONTRAST TECHNIQUE: Multidetector CT imaging of the left hip was performed according to the standard protocol. Multiplanar CT image reconstructions were also generated. COMPARISON:  CT left hip dated March 16, 2020. FINDINGS: Bones/Joint/Cartilage Prior left total hip arthroplasty with similar lucency along the proximal femoral stem in the intertrochanteric region extending inferiorly along the posterior cortex to beneath the lesser trochanter. Intra-articular air around the femoral neck has resolved. No acute fracture or dislocation. Ligaments Suboptimally assessed by CT. Muscles and Tendons Interval pigtail drainage  catheter placement within the left vastus lateralis intramuscular abscess without definite residual fluid collection. Fluid collection surrounding the proximal femoral component more superiorly has significantly decreased in  size and is less well-defined when compared to the prior study, precluding accurate measurement. Resolved foci of air. Ill-defined fluid within the left adductor musculature has decreased when compared to prior study with resolved foci of air. Soft tissues Improved soft tissue swelling of the upper thigh. IMPRESSION: 1. Interval pigtail drainage catheter placement within the left vastus lateralis intramuscular abscess without definite residual fluid collection. This may be better assessed with ultrasound given some streak artifact from the hip arthroplasty. 2. Additional fluid collection surrounding the proximal femoral component more superiorly has significantly decreased in size and is less well-defined when compared to the prior study, although some residual collection is suspected. Again, this could be better assessed with ultrasound. 3. Improving left adductor musculature pyomyositis. 4. Prior left total hip arthroplasty with similar periprosthetic lucency around the proximal femoral component concerning for septic loosening. Electronically Signed   By: Titus Dubin M.D.   On: 04/01/2020 14:09   IR US Guide Bx Asp/Drain  Result Date: 03/17/2020 INDICATION: CHRONIC LEFT THIGH HEMATOMA/ABSCESS EXAM: ULTRASOUND DRAINAGE LEFT ANTERIOR THIGH HEMATOMA/ABSCESS MEDICATIONS: The patient is currently admitted to the hospital and receiving intravenous antibiotics. The antibiotics were administered within an appropriate time frame prior to the initiation of the procedure. ANESTHESIA/SEDATION: Fentanyl 100 mcg IV; Versed 2.0 mg IV Moderate Sedation Time:  13 minutes The patient was continuously monitored during the procedure by the interventional radiology nurse under my direct supervision.  COMPLICATIONS: None. PROCEDURE: Informed written consent was obtained from the patient after a thorough discussion of the procedural risks, benefits and alternatives. All questions were addressed. Maximal Sterile Barrier Technique was utilized including caps, mask, sterile gowns, sterile gloves, sterile drape, hand hygiene and skin antiseptic. A timeout was performed prior to the initiation of the procedure. Previous imaging reviewed. Preliminary ultrasound performed. A complex fluid collection was localized in the anterior proximal thigh. Overlying skin marked. Under sterile conditions and local anesthesia, an 18 gauge introducer needle was advanced from an anterior approach into the complex fluid collection. Needle position confirmed with ultrasound. Images obtained for documentation. Guidewire inserted followed by tract dilatation insert a 14 French drain. Drain catheter position confirmed with ultrasound. Syringe aspiration yielded 150 cc dark bloody fluid compatible with old hematoma. Sample sent for culture. Catheter secured with a Prolene suture and connected to external suction bulb. Sterile dressing applied no immediate complication. Patient tolerated procedure well. IMPRESSION: Ultrasound-guided left anterior thigh hematoma/abscess drain (14 [place]). Electronically Signed   By: Jerilynn Mages.  Shick M.D.   On: 03/17/2020 11:59   DG C-Arm 1-60 Min-No Report  Result Date: 03/17/2020 Fluoroscopy was utilized by the requesting physician.  No radiographic interpretation.   [place] LT LOWER EXTREM LTD SOFT TISSUE NON VASCULAR  Result Date: 04/01/2020 CLINICAL DATA:  Evaluate left thigh abscess and percutaneous drain. Concern for residual fluid collections around the drain on the recent CT. EXAM: ULTRASOUND LEFT LOWER EXTREMITY LIMITED TECHNIQUE: Ultrasound examination of the lower extremity soft tissues was performed in the area of clinical concern. COMPARISON:  CT left hip 04/01/2020 FINDINGS: The area around the left  upper thigh drain was evaluated. The hypoechoic collection around the drain is decompressed along the lateral aspect of the thigh. There is a residual hypoechoic collection along the anterior superior aspect of the thigh which is cephalad to the drain. This residual fluid collection is complex and likely loculated. IMPRESSION: Residual complex fluid along the anterior superior aspect of the left thigh. This collection appears to be contiguous with the drain collection. Electronically Signed  By: Markus Daft M.D.   On: 04/01/2020 15:09   IR Radiologist Eval & Mgmt  Result Date: 04/15/2020 Please refer to notes tab for details about interventional procedure. (Op Note)  IR Radiologist Eval & Mgmt  Result Date: 04/01/2020 Please refer to notes tab for details about interventional procedure. (Op Note)   Labs:  CBC: Recent Labs    03/18/20 0350 03/19/20 0450 03/20/20 0300 03/21/20 0403  WBC 5.4 7.0 7.6 8.4  HGB 9.2* 7.7* 9.0* 9.3*  HCT 28.6* 24.5* 28.2* 29.0*  PLT 349 310 352 260    COAGS: Recent Labs    03/04/20 1252  INR 1.1    BMP: Recent Labs    10/24/19 0807 12/06/19 0736 01/06/20 0751 01/13/20 0716 01/14/20 1027 03/17/20 0541 03/18/20 0350 03/19/20 0450 03/20/20 0300  NA 135 137 136 136   < > 138 138 139 138  K 4.8 5.4* 5.3 6.4*   < > 4.7 5.4* 4.6 4.7  CL 109 109 108 110   < > 110 110 110 110  CO2 18* 21 20 19*   < > 21* 18* 22 21*  GLUCOSE 145* 152* 107* 58*   < > 142* 287* 176* 178*  BUN 33* 43* 55* 52*   < > '22 21 21 20  ' CALCIUM 9.0 9.0 9.0 8.9   < > 7.6* 8.1* 7.6* 7.5*  CREATININE 1.62* 2.02* 3.11* 3.31*   < > 1.28* 1.27* 1.25* 1.14  GFRNONAA 41* 32* 19* 17*   < > 59* 59* >60 >60  GFRAA 48* 37* 22* 20*  --   --   --   --   --    < > = values in this interval not displayed.    LIVER FUNCTION TESTS: Recent Labs    03/09/20 0419 03/10/20 0633 03/12/20 0450 03/19/20 0450  BILITOT 0.6 0.9 0.5 0.4  AST 44* 76* 35 14*  ALT 33 67* 49* 18  ALKPHOS 94 97  83 56  PROT 7.1 6.9 6.0* 5.5*  ALBUMIN 2.3* 2.4* 2.3* 1.9*    TUMOR MARKERS: No results for input(s): AFPTM, CEA, CA199, CHROMGRNA in the last 8760 hours.  Assessment and Plan:  75 year old with history of left hip infection and left thigh abscess/infected hematoma.  Ultrasound around the drain demonstrated a small residual complex fluid collection.  The fluid collection is not compressible.  Based on the ultrasound appearance and the turbid red output, findings are compatible with a resolving hematoma.  Patient had 75 mL within the bulb and this represents 2 days of drainage.  At this point, the hematoma is slowly resolving.  Would keep the drain in place as long as there continues to be greater than 20 mL/day which is probably the case based on the volume in the bulb today.  Based on the ultrasound appearance, I think there is very little benefit to repositioning the tube because the collection is not compressible.  I removed the retention suture because it was irritating the patient.  A new StatLock was placed.  Instructed the patient and family to record the output each day.  Patient will continue antibiotics and plan for follow-up visit with ultrasound in 2 weeks.     Electronically Signed: Burman Riis 04/15/2020, 10:36 AM   I spent a total of    15 Minutes in face to face in clinical consultation, greater than 50% of which was counseling/coordinating care for drain management  patient ID: RUDDY SWIRE, male   DOB: [birth year],  75 y.o.   MRN: [id number]

## 2020-04-17 ENCOUNTER — Other Ambulatory Visit: Payer: Self-pay

## 2020-04-20 DIAGNOSIS — R7881 Bacteremia: Secondary | ICD-10-CM | POA: Diagnosis not present

## 2020-04-20 DIAGNOSIS — M25452 Effusion, left hip: Secondary | ICD-10-CM | POA: Diagnosis not present

## 2020-04-21 ENCOUNTER — Other Ambulatory Visit: Payer: Self-pay | Admitting: Family Medicine

## 2020-04-22 ENCOUNTER — Ambulatory Visit (HOSPITAL_COMMUNITY)
Admission: RE | Admit: 2020-04-22 | Discharge: 2020-04-22 | Disposition: A | Discharge status: Home / Self Care | Payer: HMO | Source: Ambulatory Visit | Attending: Infectious Disease | Admitting: Infectious Disease

## 2020-04-22 ENCOUNTER — Other Ambulatory Visit: Payer: Self-pay

## 2020-04-22 ENCOUNTER — Other Ambulatory Visit: Payer: Self-pay | Admitting: Infectious Disease

## 2020-04-22 DIAGNOSIS — R7881 Bacteremia: Secondary | ICD-10-CM | POA: Diagnosis not present

## 2020-04-22 DIAGNOSIS — T8459XD Infection and inflammatory reaction due to other internal joint prosthesis, subsequent encounter: Secondary | ICD-10-CM

## 2020-04-22 DIAGNOSIS — Z96649 Presence of unspecified artificial hip joint: Secondary | ICD-10-CM

## 2020-04-22 DIAGNOSIS — Z452 Encounter for adjustment and management of vascular access device: Secondary | ICD-10-CM | POA: Insufficient documentation

## 2020-04-22 HISTORY — PX: IR REMOVAL TUN CV CATH W/O FL: IMG2289

## 2020-04-22 NOTE — Procedures (Signed)
Pre procedural Dx: Bacteremia Post procedural Dx: Same - resolved  Successful removal of tunneled right IJ central venous catheter.  Dressing to remain x 24H then may remove and shower. Hold firm pressure at IJ and insertion site if bleeding occurs.  EBL: None No immediate complications.  Please see imaging section of Epic for full dictation.  Joaquim Nam PA-C 04/22/2020 10:04 AM

## 2020-04-28 DIAGNOSIS — Z96642 Presence of left artificial hip joint: Secondary | ICD-10-CM | POA: Diagnosis not present

## 2020-04-30 ENCOUNTER — Encounter: Payer: Self-pay | Admitting: Radiology

## 2020-04-30 ENCOUNTER — Ambulatory Visit
Admission: RE | Admit: 2020-04-30 | Discharge: 2020-04-30 | Disposition: A | Discharge status: Home / Self Care | Payer: HMO | Source: Ambulatory Visit | Attending: Diagnostic Radiology | Admitting: Diagnostic Radiology

## 2020-04-30 DIAGNOSIS — M7989 Other specified soft tissue disorders: Secondary | ICD-10-CM | POA: Diagnosis not present

## 2020-04-30 DIAGNOSIS — L02419 Cutaneous abscess of limb, unspecified: Secondary | ICD-10-CM

## 2020-04-30 HISTORY — PX: IR RADIOLOGIST EVAL & MGMT: IMG5224

## 2020-04-30 NOTE — Progress Notes (Signed)
Interventional Radiology Progress Note  [name] arrives for another evaluation of soft tissue drain in the left thigh.   He reports ~20cc of output for the whole last week.  US shows thin lentiform residual fluid/soft tissue.    He denies any fever/rigors/chills.  Denies any pain.   We removed the drain at the bedside and placed dry dressing.    Recommendations:  - Follow up with ortho/ID on schedule. - Do not submerge for 7-10 days - Routine wound care   Signed,  Dulcy Fanny. Earleen Newport, DO

## 2020-05-11 ENCOUNTER — Other Ambulatory Visit: Payer: Self-pay | Admitting: Family Medicine

## 2020-05-13 ENCOUNTER — Ambulatory Visit (INDEPENDENT_AMBULATORY_CARE_PROVIDER_SITE_OTHER): Payer: HMO | Admitting: Infectious Disease

## 2020-05-13 ENCOUNTER — Encounter: Payer: Self-pay | Admitting: Infectious Diseases

## 2020-05-13 ENCOUNTER — Other Ambulatory Visit: Payer: Self-pay

## 2020-05-13 VITALS — BP 175/90 | HR 77 | Temp 98.0°F | Resp 16 | Ht 66.0 in | Wt 215.8 lb

## 2020-05-13 DIAGNOSIS — T8459XD Infection and inflammatory reaction due to other internal joint prosthesis, subsequent encounter: Secondary | ICD-10-CM

## 2020-05-13 DIAGNOSIS — E1122 Type 2 diabetes mellitus with diabetic chronic kidney disease: Secondary | ICD-10-CM

## 2020-05-13 DIAGNOSIS — Z96649 Presence of unspecified artificial hip joint: Secondary | ICD-10-CM | POA: Diagnosis not present

## 2020-05-13 DIAGNOSIS — I10 Essential (primary) hypertension: Secondary | ICD-10-CM

## 2020-05-13 DIAGNOSIS — E11649 Type 2 diabetes mellitus with hypoglycemia without coma: Secondary | ICD-10-CM

## 2020-05-13 DIAGNOSIS — N183 Chronic kidney disease, stage 3 unspecified: Secondary | ICD-10-CM

## 2020-05-13 DIAGNOSIS — I129 Hypertensive chronic kidney disease with stage 1 through stage 4 chronic kidney disease, or unspecified chronic kidney disease: Secondary | ICD-10-CM | POA: Diagnosis not present

## 2020-05-13 DIAGNOSIS — L02419 Cutaneous abscess of limb, unspecified: Secondary | ICD-10-CM | POA: Diagnosis not present

## 2020-05-13 DIAGNOSIS — T8459XA Infection and inflammatory reaction due to other internal joint prosthesis, initial encounter: Secondary | ICD-10-CM

## 2020-05-13 DIAGNOSIS — A4159 Other Gram-negative sepsis: Secondary | ICD-10-CM | POA: Diagnosis not present

## 2020-05-13 LAB — COMPREHENSIVE METABOLIC PANEL
ALT: 23 U/L (ref 0–44)
AST: 16 U/L (ref 15–41)
Albumin: 3.4 g/dL — ABNORMAL LOW (ref 3.5–5.0)
Alkaline Phosphatase: 122 U/L (ref 38–126)
Anion gap: 13 (ref 5–15)
BUN: 37 mg/dL — ABNORMAL HIGH (ref 8–23)
CO2: 20 mmol/L — ABNORMAL LOW (ref 22–32)
Calcium: 9.4 mg/dL (ref 8.9–10.3)
Chloride: 100 mmol/L (ref 98–111)
Creatinine, Ser: 2.13 mg/dL — ABNORMAL HIGH (ref 0.61–1.24)
GFR, Estimated: 32 mL/min — ABNORMAL LOW (ref >60)
Glucose, Bld: 157 mg/dL — ABNORMAL HIGH (ref 70–99)
Potassium: 6.5 mmol/L (ref 3.5–5.1)
Sodium: 133 mmol/L — ABNORMAL LOW (ref 135–145)
Total Bilirubin: 0.4 mg/dL (ref 0.3–1.2)
Total Protein: 7.1 g/dL (ref 6.5–8.1)

## 2020-05-13 NOTE — Progress Notes (Signed)
Called by lab that pt has K+ 6.5 I called pt and asked him have rechecked tonight- Ed, urgent care, whatever is convenient for him

## 2020-05-13 NOTE — Progress Notes (Signed)
Subjective:  Chief complaint follow-up for prosthetic joint infection    Patient ID: [name], male    DOB: Sep 14, 1945, 75 y.o.   MRN: [id number]  HPI   75 year old Black man with PMHx of CAD, DM2, HTN, CKD, OA, s/p left hip arthoplasty (2001), prostate cancer, PAF, left ureteral stone s/p stenting (01/2020) who was sent from ortho clinic on 11/23 by Dr Wynelle Link. He is growing Enterobacter from blood, urine and hip aspirate. He is sp surgery with Irrigation and debridement, left hip, with bearing surface exchange. Enterobacter grew on cultures.  We had him scheduled to complete antibiotics on 10 January.  In the interim he developed a fluid collection in his thigh that was drained and seemed rather bloody but which also grew Enterobacter from culture.  Since I last saw him he finished his cefepime and we started bactrim DS BID.  Supposed to have had a metabolic panel shortly after starting that regimen but has not had that done yet.  He has been seen by interventional radiology and his drain output became minimal and this was now been removed on his thigh.  He has noticed in the last few days an area that is a "bump that is long the proximal aspect of his incision.  There is an area that is palpable there today see my picture.  It is not clearly fluctuant but does need attention and he does need to see Dr. Reynaldo Minium.  He is scheduled see him later this month.  Says his hip does not hurt much at all though he did have pain around the time of his prosthetic joint infection.  He has no fevers chills malaise or other systemic symptoms.       Past Medical History:  Diagnosis Date  . Arthritis   . Asthma    no inhaler  . Coronary artery disease    cardiologist-  dr spruill; last visit 3 mos ago per pt  . Enterobacter sepsis ([place]) 04/13/2020  . GERD (gastroesophageal reflux disease)   . History of MI (myocardial infarction)    1985  . Hydronephrosis, left   . Hypertension   .  Myocardial infarction ([place])   . Nephrolithiasis    left  . 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 Presence of tooth-root and mandibular implants    lower dental implants  . Prostate cancer ([place])   . Prosthetic hip infection ([place]) 04/13/2020  . Shortness of breath    WITH EXERTION  . Thigh abscess 04/13/2020  . Type 2 diabetes mellitus ([place])     Past Surgical History:  Procedure Laterality Date  . BUNIONECTOMY    . CYSTOSCOPY W/ RETROGRADES Left 03/14/2014   Procedure: CYSTOSCOPY WITH LEFT RETROGRADE PYELOGRAM, Left Ureteroscopy, [place] ureteral Stent No string;  Surgeon: [name], MD;  Location: [facility];  Service: Urology;  Laterality: Left;  . CYSTOSCOPY W/ URETERAL STENT PLACEMENT Bilateral 01/15/2020   Procedure: CYSTOSCOPY WITH RETROGRADE PYELOGRAM/URETERAL STENT PLACEMENT;  Surgeon: Alexis Frock, MD;  Location: [facility];  Service: Urology;  Laterality: Bilateral;  . CYSTOSCOPY/URETEROSCOPY/HOLMIUM LASER/STENT PLACEMENT Bilateral 03/17/2020   Procedure: CYSTOSCOPY/URETEROSCOPY/HOLMIUM LASER/STENT LEFT PLACEMENT;  Surgeon: Festus Aloe, MD;  Location: [facility];  Service: Urology;  Laterality: Bilateral;  . HERNIA REPAIR    . INCISION AND DRAINAGE Left 03/09/2020   Procedure: INCISION AND DRAINAGE LEFT HIP WITH LINER EXCHANGE;  Surgeon: Gaynelle Arabian, MD;  Location: [facility];  Service: Orthopedics;  Laterality: Left;  . IR FLUORO GUIDE CV LINE RIGHT  03/10/2020  . IR RADIOLOGIST EVAL & MGMT  04/01/2020  . IR RADIOLOGIST EVAL & MGMT  04/15/2020  . IR RADIOLOGIST EVAL & MGMT  04/30/2020  . IR REMOVAL TUN CV CATH W/O FL  04/22/2020  . IR US GUIDE BX ASP/DRAIN  03/17/2020  . IR US GUIDE VASC ACCESS RIGHT  03/10/2020  . PROSTATE BIOPSY    . TOTAL HIP ARTHROPLASTY Left 03-20-2009  . TOTAL HIP ARTHROPLASTY  04/09/2012   Procedure: TOTAL HIP ARTHROPLASTY;  Surgeon: [name], MD;  Location: [facility];  Service: Orthopedics;  Laterality: Right;  .  TOTAL KNEE ARTHROPLASTY Left 05-16-2008    Family History  Problem Relation Age of Onset  . Cancer Mother        breast  . Multiple sclerosis Daughter   . Cancer Father        prostate  . Cancer Maternal Uncle        bone      Social History   Socioeconomic History  . Marital status: Widowed    Spouse name: Not on file  . Number of children: Not on file  . Years of education: Not on file  . Highest education level: Not on file  Occupational History  . Not on file  Tobacco Use  . Smoking status: Former Smoker    Packs/day: 1.00    Years: 25.00    Pack years: 25.00    Types: Cigarettes    Quit date: 04/11/1984    Years since quitting: 36.1  . Smokeless tobacco: Former Systems developer    Quit date: 04/02/1985  Vaping Use  . Vaping Use: Never used  Substance and Sexual Activity  . Alcohol use: Not Currently    Comment: RARE  . Drug use: No  . Sexual activity: Not Currently  Other Topics Concern  . Not on file  Social History Narrative  . Not on file   Social Determinants of Health   Financial Resource Strain: Not on file  Food Insecurity: No Food Insecurity  . Worried About Charity fundraiser in the Last Year: Never true  . Ran Out of Food in the Last Year: Never true  Transportation Needs: No Transportation Needs  . Lack of Transportation (Medical): No  . Lack of Transportation (Non-Medical): No  Physical Activity: Not on file  Stress: Not on file  Social Connections: Not on file    Allergies  Allergen Reactions  . Shellfish Allergy Rash     Current Outpatient Medications:  .  albuterol (VENTOLIN HFA) 108 (90 Base) MCG/ACT inhaler, Inhale 2 puffs into the lungs every 6 (six) hours as needed for wheezing or shortness of breath., Disp: 6.7 g, Rfl: 6 .  amiodarone (PACERONE) 200 MG tablet, Take 1 tablet (200 mg total) by mouth daily., Disp: , Rfl:  .  Aspirin 81 MG CAPS, Take 81 mg by mouth daily. , Disp: , Rfl:  .  atorvastatin (LIPITOR) 40 MG tablet, Take 1  tablet (40 mg total) by mouth daily., Disp: 30 tablet, Rfl: 0 .  co-enzyme Q-10 30 MG capsule, Take 30 mg by mouth daily., Disp: , Rfl:  .  Continuous Blood Gluc Receiver (FREESTYLE LIBRE 42 DAY READER) DEVI, See admin instructions., Disp: , Rfl:  .  Continuous Blood Gluc Sensor (FREESTYLE LIBRE 14 DAY SENSOR) MISC, 1 Device by Does not apply route every 14 (fourteen) days., Disp: 6 each, Rfl: 1 .  cycloSPORINE (  RESTASIS) 0.05 % ophthalmic emulsion, Place 1 drop into both eyes 2 (two) times daily. , Disp: , Rfl:  .  diclofenac Sodium (VOLTAREN) 1 % GEL, Apply 4 g topically 4 (four) times daily., Disp: 100 g, Rfl: 0 .  diltiazem (CARDIZEM CD) 120 MG 24 hr capsule, Take 1 capsule (120 mg total) by mouth daily., Disp: , Rfl:  .  docusate sodium (COLACE) 100 MG capsule, Take 100 mg by mouth daily as needed for mild constipation., Disp: , Rfl:  .  ferrous sulfate 325 (65 FE) MG tablet, Take 325 mg by mouth daily with breakfast., Disp: , Rfl:  .  gabapentin (NEURONTIN) 300 MG capsule, Take 1 capsule (300 mg total) by mouth 3 (three) times daily. Start 300mg  at bedtime x1 week then may increase to three times per day. (Patient taking differently: Take 300 mg by mouth in the morning, at noon, and at bedtime.), Disp: 90 capsule, Rfl: 3 .  glimepiride (AMARYL) 4 MG tablet, Take 4 mg by mouth daily before breakfast., Disp: , Rfl:  .  omeprazole (PRILOSEC) 40 MG capsule, Prilosec 40 mg capsule,delayed release   40 mg by oral route., Disp: , Rfl:  .  predniSONE (DELTASONE) 50 MG tablet, TAKE DAILY UNTIL RESOLUTION OF GOUT SYMPTOMS., Disp: 10 tablet, Rfl: 0 .  sitaGLIPtin (JANUVIA) 50 MG tablet, Take 1 tablet (50 mg total) by mouth daily., Disp: 30 tablet, Rfl: 0 .  spironolactone (ALDACTONE) 25 MG tablet, Take 25 mg by mouth daily., Disp: , Rfl:  .  sulfamethoxazole-trimethoprim (BACTRIM DS) 800-160 MG tablet, Take 1 tablet by mouth 2 (two) times daily., Disp: 60 tablet, Rfl: 5 .  torsemide (DEMADEX) 20 MG  tablet, Take 20 mg by mouth daily., Disp: , Rfl:    Review of Systems  Constitutional: Negative for activity change, appetite change, chills, diaphoresis, fatigue, fever and unexpected weight change.  HENT: Negative for congestion, rhinorrhea, sinus pressure, sneezing, sore throat and trouble swallowing.   Eyes: Negative for photophobia and visual disturbance.  Respiratory: Negative for chest tightness, shortness of breath, wheezing and stridor.   Cardiovascular: Negative for chest pain, palpitations and leg swelling.  Gastrointestinal: Negative for abdominal distention, abdominal pain, anal bleeding, blood in stool, constipation, diarrhea, nausea and vomiting.  Genitourinary: Negative for difficulty urinating, dysuria, flank pain and hematuria.  Musculoskeletal: Negative for arthralgias, back pain, gait problem and joint swelling.  Skin: Positive for wound. Negative for color change, pallor and rash.  Neurological: Negative for dizziness, tremors, weakness and light-headedness.  Hematological: Negative for adenopathy. Does not bruise/bleed easily.  Psychiatric/Behavioral: Negative for agitation, behavioral problems, confusion, decreased concentration, dysphoric mood and sleep disturbance.       Objective:   Physical Exam Constitutional:      General: He is not in acute distress.    Appearance: Normal appearance. He is well-developed. He is not ill-appearing or diaphoretic.  HENT:     Head: Normocephalic and atraumatic.     Right Ear: Hearing and external ear normal.     Left Ear: Hearing and external ear normal.     Nose: No nasal deformity or rhinorrhea.  Eyes:     General: No scleral icterus.    Conjunctiva/sclera: Conjunctivae normal.     Right eye: Right conjunctiva is not injected.     Left eye: Left conjunctiva is not injected.     Pupils: Pupils are equal, round, and reactive to light.  Neck:     Vascular: No JVD.  Cardiovascular:  Rate and Rhythm: Normal rate and  regular rhythm.     Heart sounds: S1 normal and S2 normal. No murmur heard.   Pulmonary:     Effort: Pulmonary effort is normal. No respiratory distress.  Abdominal:     General: There is no distension.     Palpations: Abdomen is soft.     Tenderness: There is no abdominal tenderness.  Musculoskeletal:        General: Normal range of motion.     Right shoulder: Normal.     Left shoulder: Normal.     Cervical back: Normal range of motion and neck supple.     Right hip: Normal.     Left hip: Normal.     Right knee: Normal.     Left knee: Normal.  Lymphadenopathy:     Head:     Right side of head: No submandibular, preauricular or posterior auricular adenopathy.     Left side of head: No submandibular, preauricular or posterior auricular adenopathy.     Cervical: No cervical adenopathy.     Right cervical: No superficial or deep cervical adenopathy.    Left cervical: No superficial or deep cervical adenopathy.  Skin:    General: Skin is warm and dry.     Coloration: Skin is not pale.     Findings: No abrasion, bruising, ecchymosis, erythema, lesion or rash.     Nails: There is no clubbing.  Neurological:     General: No focal deficit present.     Mental Status: He is alert and oriented to person, place, and time.     Sensory: No sensory deficit.     Coordination: Coordination normal.     Gait: Gait normal.  Psychiatric:        Attention and Perception: He is attentive.        Mood and Affect: Mood normal.        Speech: Speech normal.        Behavior: Behavior normal. Behavior is cooperative.        Thought Content: Thought content normal.        Judgment: Judgment normal.     Incision site May 13, 2020:         Assessment & Plan:  Prosthetic joint infection status post exchange of part of the prosthesis with Enterobacter isolated:  Cefepime is been completed and he has been started on Bactrim   Check stat CMP today to ensure his creatinine is okay as well  as potassium.  Check sed rate CRP CBC with differential  Regarding the area on the proximal aspect of the wound I hope this is not a new phlegmon or abscess.  I have asked him to apply warm compresses to the area and Alusio  Chronic kidney disease improved while in the hospital and hopefully can tolerate Bactrim \.  As above labs done weeks ago but will check from today.  Fluid collection in the thigh what looked like a hematoma did grow the same bacteria drain is out now   I spent greater than 40 minutes with the patient including greater than 50% of time in face to face counsel of the patient and his caregiver regarding nature of prosthetic joint infections and how we manage him with IV followed by oral antibiotics and in coordination of his care.

## 2020-05-14 ENCOUNTER — Telehealth: Payer: Self-pay | Admitting: *Deleted

## 2020-05-14 ENCOUNTER — Ambulatory Visit (INDEPENDENT_AMBULATORY_CARE_PROVIDER_SITE_OTHER): Payer: HMO | Admitting: Infectious Disease

## 2020-05-14 ENCOUNTER — Emergency Department (HOSPITAL_COMMUNITY)
Admission: EM | Admit: 2020-05-14 | Discharge: 2020-05-14 | Disposition: A | Discharge status: Home / Self Care | Payer: HMO | Attending: Emergency Medicine | Admitting: Emergency Medicine

## 2020-05-14 ENCOUNTER — Ambulatory Visit: Payer: HMO | Admitting: Family Medicine

## 2020-05-14 ENCOUNTER — Encounter: Payer: Self-pay | Admitting: Infectious Disease

## 2020-05-14 VITALS — BP 169/86 | HR 78 | Temp 98.4°F | Wt 215.0 lb

## 2020-05-14 DIAGNOSIS — J45909 Unspecified asthma, uncomplicated: Secondary | ICD-10-CM | POA: Insufficient documentation

## 2020-05-14 DIAGNOSIS — Z7984 Long term (current) use of oral hypoglycemic drugs: Secondary | ICD-10-CM | POA: Insufficient documentation

## 2020-05-14 DIAGNOSIS — I2510 Atherosclerotic heart disease of native coronary artery without angina pectoris: Secondary | ICD-10-CM | POA: Diagnosis not present

## 2020-05-14 DIAGNOSIS — Z79899 Other long term (current) drug therapy: Secondary | ICD-10-CM | POA: Diagnosis not present

## 2020-05-14 DIAGNOSIS — Z8546 Personal history of malignant neoplasm of prostate: Secondary | ICD-10-CM | POA: Diagnosis not present

## 2020-05-14 DIAGNOSIS — N179 Acute kidney failure, unspecified: Secondary | ICD-10-CM | POA: Diagnosis not present

## 2020-05-14 DIAGNOSIS — E1122 Type 2 diabetes mellitus with diabetic chronic kidney disease: Secondary | ICD-10-CM | POA: Diagnosis not present

## 2020-05-14 DIAGNOSIS — Z87891 Personal history of nicotine dependence: Secondary | ICD-10-CM | POA: Insufficient documentation

## 2020-05-14 DIAGNOSIS — N189 Chronic kidney disease, unspecified: Secondary | ICD-10-CM | POA: Diagnosis not present

## 2020-05-14 DIAGNOSIS — T8459XD Infection and inflammatory reaction due to other internal joint prosthesis, subsequent encounter: Secondary | ICD-10-CM

## 2020-05-14 DIAGNOSIS — E875 Hyperkalemia: Secondary | ICD-10-CM

## 2020-05-14 DIAGNOSIS — I129 Hypertensive chronic kidney disease with stage 1 through stage 4 chronic kidney disease, or unspecified chronic kidney disease: Secondary | ICD-10-CM | POA: Insufficient documentation

## 2020-05-14 DIAGNOSIS — R7989 Other specified abnormal findings of blood chemistry: Secondary | ICD-10-CM | POA: Diagnosis present

## 2020-05-14 DIAGNOSIS — Z7982 Long term (current) use of aspirin: Secondary | ICD-10-CM | POA: Insufficient documentation

## 2020-05-14 DIAGNOSIS — Z96649 Presence of unspecified artificial hip joint: Secondary | ICD-10-CM

## 2020-05-14 DIAGNOSIS — A4159 Other Gram-negative sepsis: Secondary | ICD-10-CM | POA: Diagnosis not present

## 2020-05-14 DIAGNOSIS — I10 Essential (primary) hypertension: Secondary | ICD-10-CM | POA: Diagnosis not present

## 2020-05-14 LAB — BASIC METABOLIC PANEL
Anion gap: 12 (ref 5–15)
Anion gap: 9 (ref 5–15)
BUN: 34 mg/dL — ABNORMAL HIGH (ref 8–23)
BUN: 34 mg/dL — ABNORMAL HIGH (ref 8–23)
CO2: 18 mmol/L — ABNORMAL LOW (ref 22–32)
CO2: 17 mmol/L — ABNORMAL LOW (ref 22–32)
Calcium: 9.6 mg/dL (ref 8.9–10.3)
Calcium: 9.4 mg/dL (ref 8.9–10.3)
Chloride: 105 mmol/L (ref 98–111)
Chloride: 108 mmol/L (ref 98–111)
Creatinine, Ser: 1.97 mg/dL — ABNORMAL HIGH (ref 0.61–1.24)
Creatinine, Ser: 1.95 mg/dL — ABNORMAL HIGH (ref 0.61–1.24)
GFR, Estimated: 35 mL/min — ABNORMAL LOW (ref >60)
GFR, Estimated: 35 mL/min — ABNORMAL LOW (ref >60)
Glucose, Bld: 155 mg/dL — ABNORMAL HIGH (ref 70–99)
Glucose, Bld: 188 mg/dL — ABNORMAL HIGH (ref 70–99)
Potassium: 6.4 mmol/L (ref 3.5–5.1)
Potassium: 5.9 mmol/L — ABNORMAL HIGH (ref 3.5–5.1)
Sodium: 135 mmol/L (ref 135–145)
Sodium: 134 mmol/L — ABNORMAL LOW (ref 135–145)

## 2020-05-14 LAB — CBC WITH DIFFERENTIAL/PLATELET
Absolute Monocytes: 738 cells/uL (ref 200–950)
Basophils Absolute: 33 cells/uL (ref 0–200)
Basophils Relative: 0.4 %
Eosinophils Absolute: 123 cells/uL (ref 15–500)
Eosinophils Relative: 1.5 %
HCT: 35.3 % — ABNORMAL LOW (ref 38.5–50.0)
Hemoglobin: 12 g/dL — ABNORMAL LOW (ref 13.2–17.1)
Lymphs Abs: 1574 cells/uL (ref 850–3900)
MCH: 30.5 pg (ref 27.0–33.0)
MCHC: 34 g/dL (ref 32.0–36.0)
MCV: 89.8 fL (ref 80.0–100.0)
MPV: 10 fL (ref 7.5–12.5)
Monocytes Relative: 9 %
Neutro Abs: 5732 cells/uL (ref 1500–7800)
Neutrophils Relative %: 69.9 %
Platelets: 440 10*3/uL — ABNORMAL HIGH (ref 140–400)
RBC: 3.93 10*6/uL — ABNORMAL LOW (ref 4.20–5.80)
RDW: 15.2 % — ABNORMAL HIGH (ref 11.0–15.0)
Total Lymphocyte: 19.2 %
WBC: 8.2 10*3/uL (ref 3.8–10.8)

## 2020-05-14 LAB — CBC
HCT: 36.2 % — ABNORMAL LOW (ref 39.0–52.0)
Hemoglobin: 11.8 g/dL — ABNORMAL LOW (ref 13.0–17.0)
MCH: 29.9 pg (ref 26.0–34.0)
MCHC: 32.6 g/dL (ref 30.0–36.0)
MCV: 91.6 fL (ref 80.0–100.0)
Platelets: 448 10*3/uL — ABNORMAL HIGH (ref 150–400)
RBC: 3.95 MIL/uL — ABNORMAL LOW (ref 4.22–5.81)
RDW: 16.1 % — ABNORMAL HIGH (ref 11.5–15.5)
WBC: 8.7 10*3/uL (ref 4.0–10.5)
nRBC: 0 % (ref 0.0–0.2)

## 2020-05-14 LAB — C-REACTIVE PROTEIN: CRP: 26.9 mg/L — ABNORMAL HIGH (ref <8.0)

## 2020-05-14 LAB — URINALYSIS, ROUTINE W REFLEX MICROSCOPIC
Bilirubin Urine: NEGATIVE
Glucose, UA: NEGATIVE mg/dL
Hgb urine dipstick: NEGATIVE
Ketones, ur: NEGATIVE mg/dL
Leukocytes,Ua: NEGATIVE
Nitrite: NEGATIVE
Protein, ur: 100 mg/dL — AB
Specific Gravity, Urine: 1.018 (ref 1.005–1.030)
pH: 5 (ref 5.0–8.0)

## 2020-05-14 LAB — SEDIMENTATION RATE: Sed Rate: 117 mm/h — ABNORMAL HIGH (ref 0–20)

## 2020-05-14 MED ORDER — SODIUM ZIRCONIUM CYCLOSILICATE 10 G PO PACK
10.0000 g | PACK | Freq: Once | ORAL | Status: AC
  Administered 2020-05-14: 10 g via ORAL
  Filled 2020-05-14: qty 1

## 2020-05-14 MED ORDER — SODIUM CHLORIDE 0.9 % IV BOLUS
1000.0000 mL | Freq: Once | INTRAVENOUS | Status: AC
  Administered 2020-05-14: 1000 mL via INTRAVENOUS

## 2020-05-14 NOTE — ED Triage Notes (Signed)
Pt arrives to ER after being sent for elevated K level of 6.5 and declining kidney failure. Pt arrives to ED alert and oriented x4.

## 2020-05-14 NOTE — Patient Instructions (Signed)
Please stay here until we have your labs back  If potassium still as high and kidney function poor will need to have youu go tot he ER

## 2020-05-14 NOTE — Progress Notes (Signed)
Subjective:  Chief complaint hyperkalemia    Patient ID: [name], male    DOB: 12-29-1945, 75 y.o.   MRN: [id number]  HPI   75 year old Black man with PMHx of CAD, DM2, HTN, CKD, OA, s/p left hip arthoplasty (2001), prostate cancer, PAF, left ureteral stone s/p stenting (01/2020) who was sent from ortho clinic on 11/23 by Dr Wynelle Link. He is growing Enterobacter from blood, urine and hip aspirate. He is sp surgery with Irrigation and debridement, left hip, with bearing surface exchange. Enterobacter grew on cultures.  We had him scheduled to complete antibiotics on 10 January.  In the interim he developed a fluid collection in his thigh that was drained and seemed rather bloody but which also grew Enterobacter from culture.  Since I last saw him he finished his cefepime and we started bactrim DS BID.  Supposed to have had a metabolic panel shortly after starting that regimen but has not had that done yet.  He has been seen by interventional radiology and his drain output became minimal and this was now been removed on his thigh.  He has noticed in the last few days an area that is a "bump that is long the proximal aspect of his incision.  There is an area that is palpable there yesterday see my picture.  It is not clearly fluctuant but does need attention and he does need to see Dr. [name].  Yesterday we checked his metabolic panel stat and fortunately his potassium came back at 6.5 okay and creatinine 2.13  I called him last night and he had already been called by one of my partners to come and have repeat labs.  I asked him to also bring all of his medications with him.  When he came to the clinic today I found that not only was he on the spironolactone that is listed on his med list but which he did not recognize me I talked him over the phone but he was also on potassium chloride that Dr. [name] had prescribed to him he he says in a visit right after I prescribed him the  Bactrim.  We obtained an EKG which shows normal sinus rhythm with some first-degree AV block and in my view peaked T waves in the anteroseptal leads but not in the inferior or lateral leads.  There are no significant T wave inversions.  I cannot access some use in epic for some reason so I cannot compare the EKG.  We have ordered a repeat stat BMP today but it has not yet been run due to the courier and not having picked it up yet.  Hopefully this change is the time of this dictation.  He does not have any symptoms of severe hyperkalemia and this certainly could also be pseudohyperkalemia but he needs to be treated if we cannot see the same lab results today.       Past Medical History:  Diagnosis Date  . Arthritis   . Asthma    no inhaler  . Coronary artery disease    cardiologist-  dr spruill; last visit 3 mos ago per pt  . Enterobacter sepsis ([place]) 04/13/2020  . GERD (gastroesophageal reflux disease)   . History of MI (myocardial infarction)    1985  . Hydronephrosis, left   . Hypertension   . Myocardial infarction ([place])   . Nephrolithiasis    left  . 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  Presence of tooth-root and mandibular implants    lower dental implants  . Prostate cancer ([place])   . 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 Shortness of breath    WITH EXERTION  . Thigh abscess 04/13/2020  . Type 2 diabetes mellitus ([place])     Past Surgical History:  Procedure Laterality Date  . BUNIONECTOMY    . CYSTOSCOPY W/ RETROGRADES Left 03/14/2014   Procedure: CYSTOSCOPY WITH LEFT RETROGRADE PYELOGRAM, Left Ureteroscopy, [place] ureteral Stent No string;  Surgeon: [name], MD;  Location: [facility];  Service: Urology;  Laterality: Left;  . CYSTOSCOPY W/ URETERAL STENT PLACEMENT Bilateral 01/15/2020   Procedure: CYSTOSCOPY WITH RETROGRADE PYELOGRAM/URETERAL STENT PLACEMENT;  Surgeon: Alexis Frock, MD;  Location: [facility];  Service:  Urology;  Laterality: Bilateral;  . CYSTOSCOPY/URETEROSCOPY/HOLMIUM LASER/STENT PLACEMENT Bilateral 03/17/2020   Procedure: CYSTOSCOPY/URETEROSCOPY/HOLMIUM LASER/STENT LEFT PLACEMENT;  Surgeon: Festus Aloe, MD;  Location: [facility];  Service: Urology;  Laterality: Bilateral;  . HERNIA REPAIR    . INCISION AND DRAINAGE Left 03/09/2020   Procedure: INCISION AND DRAINAGE LEFT HIP WITH LINER EXCHANGE;  Surgeon: Gaynelle Arabian, MD;  Location: [facility];  Service: Orthopedics;  Laterality: Left;  . IR FLUORO GUIDE CV LINE RIGHT  03/10/2020  . IR RADIOLOGIST EVAL & MGMT  04/01/2020  . IR RADIOLOGIST EVAL & MGMT  04/15/2020  . IR RADIOLOGIST EVAL & MGMT  04/30/2020  . IR REMOVAL TUN CV CATH W/O FL  04/22/2020  . IR US GUIDE BX ASP/DRAIN  03/17/2020  . IR US GUIDE VASC ACCESS RIGHT  03/10/2020  . PROSTATE BIOPSY    . TOTAL HIP ARTHROPLASTY Left 03-20-2009  . TOTAL HIP ARTHROPLASTY  04/09/2012   Procedure: TOTAL HIP ARTHROPLASTY;  Surgeon: [name], MD;  Location: [facility];  Service: Orthopedics;  Laterality: Right;  . TOTAL KNEE ARTHROPLASTY Left 05-16-2008    Family History  Problem Relation Age of Onset  . Cancer Mother        breast  . Multiple sclerosis Daughter   . Cancer Father        prostate  . Cancer Maternal Uncle        bone      Social History   Socioeconomic History  . Marital status: Widowed    Spouse name: Not on file  . Number of children: Not on file  . Years of education: Not on file  . Highest education level: Not on file  Occupational History  . Not on file  Tobacco Use  . Smoking status: Former Smoker    Packs/day: 1.00    Years: 25.00    Pack years: 25.00    Types: Cigarettes    Quit date: 04/11/1984    Years since quitting: 36.1  . Smokeless tobacco: Former Systems developer    Quit date: 04/02/1985  Vaping Use  . Vaping Use: Never used  Substance and Sexual Activity  . Alcohol use: Not Currently    Comment: RARE  . Drug use: No  . Sexual activity: Not  Currently  Other Topics Concern  . Not on file  Social History Narrative  . Not on file   Social Determinants of Health   Financial Resource Strain: Not on file  Food Insecurity: No Food Insecurity  . Worried About Charity fundraiser in the Last Year: Never true  . Ran Out of Food in the Last Year: Never true  Transportation Needs: No Transportation Needs  . Lack of Transportation (Medical): No  .  Lack of Transportation (Non-Medical): No  Physical Activity: Not on file  Stress: Not on file  Social Connections: Not on file    Allergies  Allergen Reactions  . Shellfish Allergy Rash     Current Outpatient Medications:  .  albuterol (VENTOLIN HFA) 108 (90 Base) MCG/ACT inhaler, Inhale 2 puffs into the lungs every 6 (six) hours as needed for wheezing or shortness of breath., Disp: 6.7 g, Rfl: 6 .  amiodarone (PACERONE) 200 MG tablet, Take 1 tablet (200 mg total) by mouth daily., Disp: , Rfl:  .  Aspirin 81 MG CAPS, Take 81 mg by mouth daily. , Disp: , Rfl:  .  atorvastatin (LIPITOR) 40 MG tablet, Take 1 tablet (40 mg total) by mouth daily., Disp: 30 tablet, Rfl: 0 .  co-enzyme Q-10 30 MG capsule, Take 30 mg by mouth daily., Disp: , Rfl:  .  Continuous Blood Gluc Receiver (FREESTYLE LIBRE 48 DAY READER) DEVI, See admin instructions., Disp: , Rfl:  .  Continuous Blood Gluc Sensor (FREESTYLE LIBRE 14 DAY SENSOR) MISC, 1 Device by Does not apply route every 14 (fourteen) days., Disp: 6 each, Rfl: 1 .  cycloSPORINE (RESTASIS) 0.05 % ophthalmic emulsion, Place 1 drop into both eyes 2 (two) times daily. , Disp: , Rfl:  .  diclofenac Sodium (VOLTAREN) 1 % GEL, Apply 4 g topically 4 (four) times daily., Disp: 100 g, Rfl: 0 .  diltiazem (CARDIZEM CD) 120 MG 24 hr capsule, Take 1 capsule (120 mg total) by mouth daily., Disp: , Rfl:  .  docusate sodium (COLACE) 100 MG capsule, Take 100 mg by mouth daily as needed for mild constipation., Disp: , Rfl:  .  ferrous sulfate 325 (65 FE) MG tablet,  Take 325 mg by mouth daily with breakfast., Disp: , Rfl:  .  gabapentin (NEURONTIN) 300 MG capsule, Take 1 capsule (300 mg total) by mouth 3 (three) times daily. Start 300mg  at bedtime x1 week then may increase to three times per day. (Patient taking differently: Take 300 mg by mouth in the morning, at noon, and at bedtime.), Disp: 90 capsule, Rfl: 3 .  glimepiride (AMARYL) 4 MG tablet, Take 4 mg by mouth daily before breakfast., Disp: , Rfl:  .  omeprazole (PRILOSEC) 40 MG capsule, Prilosec 40 mg capsule,delayed release   40 mg by oral route., Disp: , Rfl:  .  predniSONE (DELTASONE) 50 MG tablet, TAKE DAILY UNTIL RESOLUTION OF GOUT SYMPTOMS., Disp: 10 tablet, Rfl: 0 .  sitaGLIPtin (JANUVIA) 50 MG tablet, Take 1 tablet (50 mg total) by mouth daily., Disp: 30 tablet, Rfl: 0 .  torsemide (DEMADEX) 20 MG tablet, Take 20 mg by mouth daily., Disp: , Rfl:    Review of Systems  Constitutional: Negative for activity change, appetite change, chills, diaphoresis, fatigue, fever and unexpected weight change.  HENT: Negative for congestion, rhinorrhea, sinus pressure, sneezing, sore throat and trouble swallowing.   Eyes: Negative for photophobia and visual disturbance.  Respiratory: Negative for chest tightness, shortness of breath, wheezing and stridor.   Cardiovascular: Negative for chest pain, palpitations and leg swelling.  Gastrointestinal: Negative for abdominal distention, abdominal pain, anal bleeding, blood in stool, constipation, diarrhea, nausea and vomiting.  Genitourinary: Negative for difficulty urinating, dysuria, flank pain and hematuria.  Musculoskeletal: Negative for arthralgias, back pain, gait problem and joint swelling.  Skin: Positive for wound. Negative for color change, pallor and rash.  Neurological: Negative for dizziness, tremors, weakness and light-headedness.  Hematological: Negative for adenopathy. Does not bruise/bleed easily.  Psychiatric/Behavioral: Negative for behavioral  problems, confusion, decreased concentration, dysphoric mood and sleep disturbance.       Objective:   Physical Exam Constitutional:      General: He is not in acute distress.    Appearance: Normal appearance. He is well-developed. He is not ill-appearing or diaphoretic.  HENT:     Head: Normocephalic and atraumatic.     Right Ear: Hearing and external ear normal.     Left Ear: Hearing and external ear normal.     Nose: No nasal deformity or rhinorrhea.  Eyes:     General: No scleral icterus.    Conjunctiva/sclera: Conjunctivae normal.     Right eye: Right conjunctiva is not injected.     Left eye: Left conjunctiva is not injected.     Pupils: Pupils are equal, round, and reactive to light.  Neck:     Vascular: No JVD.  Cardiovascular:     Rate and Rhythm: Normal rate and regular rhythm.     Heart sounds: Normal heart sounds, S1 normal and S2 normal. No murmur heard. No friction rub. No gallop.   Pulmonary:     Effort: Pulmonary effort is normal. No respiratory distress.     Breath sounds: Normal breath sounds. No stridor. No wheezing or rhonchi.  Abdominal:     General: There is no distension.     Palpations: Abdomen is soft.     Tenderness: There is no abdominal tenderness.  Musculoskeletal:        General: Normal range of motion.     Right shoulder: Normal.     Left shoulder: Normal.     Cervical back: Normal range of motion and neck supple.     Right hip: Normal.     Left hip: Normal.     Right knee: Normal.     Left knee: Normal.  Lymphadenopathy:     Head:     Right side of head: No submandibular, preauricular or posterior auricular adenopathy.     Left side of head: No submandibular, preauricular or posterior auricular adenopathy.     Cervical: No cervical adenopathy.     Right cervical: No superficial or deep cervical adenopathy.    Left cervical: No superficial or deep cervical adenopathy.  Skin:    General: Skin is warm and dry.     Coloration: Skin is not  pale.     Findings: No abrasion, bruising, ecchymosis, erythema, lesion or rash.     Nails: There is no clubbing.  Neurological:     General: No focal deficit present.     Mental Status: He is alert and oriented to person, place, and time.     Sensory: No sensory deficit.     Coordination: Coordination normal.     Gait: Gait normal.  Psychiatric:        Attention and Perception: He is attentive.        Mood and Affect: Mood normal.        Speech: Speech normal.        Behavior: Behavior normal. Behavior is cooperative.        Thought Content: Thought content normal.        Judgment: Judgment normal.         Assessment & Plan:  Hyperkalemia and acute renal insufficiency:  Bactrim can certainly cause a false elevation of potassium and creatinine but can also cause actual hyperkalemia and acute renal insufficiency.  I did know that he had a history of  chronic kidney disease all his creatinine was 1.14 when seen by me in December.  I had ordered and it wanted him to come back and have a metabolic panel 1 week after starting the Bactrim but this was never done and instead he did not have a metabolic panel done until yesterday.  I also was unaware of him having been placed on potassium chloride right after we initiated Bactrim.  We have obtained an EKG as mentioned above and a stat BMP  .  If he still has hyperkalemia and acute renal failure I do not see how this can be managed without him going to the emergency department.  I have taken the Bactrim and will get rid of it.  I have also held onto his spironolactone and potassium chloride tablets for now.   Prosthetic joint infection status post exchange of part of the prosthesis with Enterobacter isolated:  Cefepime is been completed and he has been started on Bactrim but now we have run into problems with hyperkalemia and acute renal insufficiency.  We will have to look at other options I am not enthusiastic about a  fluoroquinolone and the patient is 75 years old given risk of C. difficile colitis confusion and other toxicities of this class of drugs.  Perhaps we can obtain a newer tetracycline such as omadacyline  but I will have to talk with pharmacy to see if that is possible  Sed rate was elevated yesterday which is a bit disconcerting.  He was also scheduled to see Dr. Reynaldo Minium today at 4 PM but if he still has elevated potassium and creatinine I am sure he will be stuck in the ER still and not build to make that appointment.    Chronic kidney disease improved while in the hospital but now worse on labs from yesterday.  Fluid collection in the thigh what looked like a hematoma did grow the same bacteria drain is out now  Amiodarone prescription he asked [place] to fill his amiodarone as he is running out of pills.  The pharmacy that was listed but only had record of him having it filled 1 time.  This was prescribed by Dr. [name] with contact his office I am not sure if this is for atrial fibrillation or another arrhythmia.    I spent greater than 40 minutes with the patient including greater than 50% of time in face to face counsel of the patient regarding risks of hyperkalemia acute renal insufficiency prosthetic joint infection and coordination of his care.

## 2020-05-14 NOTE — Discharge Instructions (Addendum)
You were evaluated in emergency room today for your elevated potassium level.  You were administered medication to help get rid of some of the potassium, as well as IV fluids in the emergency department. As your infectious disease doctor suggested, I suspect that your potassium was elevated secondary to the Bactrim (antibiotic) that you are on.  Additionally your fluid pill called spironolactone can cause you to retain potassium.  You should STOP TAKING BACTRIM AND SPIRONOLACTONE AND ANY SUPPLEMENTAL POTASSIUM indefinitely, and should follow-up with your primary care doctor tomorrow for repeat labs.  He should repeat the test of your kidney function and potassium level.  Both your infectious disease doctor's office and your PCP office are open tomorrow from 9a-5p and 8a-5p respectively. Please call them both first thing tomorrow and request repeat blood work per your ER provider.   Additionally should increase your hydration over the next few days.  Return the emergency department if develop any chest pain, shortness of breath, palpitations, abdominal pain, nausea or vomiting that does not stop, or any associated symptoms.

## 2020-05-14 NOTE — Telephone Encounter (Signed)
Patient brought medication in with him for today's work-in visit per Dr Tommy Medal. Patient has prescribed potassium supplement that was not on his medication list in EPIC. RN attempted to call Dr Zenia Resides office to let him know that Dr Tommy Medal has stopped this due to patient's hyperkalemia; front desk asked for the note to be faxed to [phone], as Dr [name] is out of the office until 1pm today. RN faxed notes and labs. Landis Gandy, RN

## 2020-05-14 NOTE — ED Provider Notes (Signed)
[facility]ERGENCY DEPARTMENT Provider Note   CSN: [id number] Arrival date & time: 05/14/20  1157     History Chief Complaint  Patient presents with  . Abnormal Lab    GENO SYDNOR is a 75 y.o. male who was admitted in December 2021 for septic arthritis of his left hip prosthetic, who presents today at the prompting of his infectious disease provider for concern of hyperkalemia to 6.5 and elevated creatinine to 2.13 yesterday and 05/13/2020.  Patient was taken off of his Bactrim, spironolactone, and potassium supplementation yesterday and his blood work was rechecked today, with persistent hyperkalemia to 6.4 and persistent AKI with creatinine of 1.97.  He was directed at that point to come to the emergency department for management of hyperkalemia.   At time of my initial exam, patient has no complaints.  He denies any chest pain, palpitations, shortness of breath, abdominal pain, nausea, vomiting, diarrhea, constipation.  He states that he has been urinating less frequently today than normal, however is not taking his spironolactone as usual.  Patient states he is not taking any of his daily medications today.  I personally reviewed this patient's medical records.  He has history of type 2 diabetes, osteoarthritis, coronary artery disease, hypertension, Enterobacter sepsis secondary to infection of the left prosthetic hip joint in 03/2020.  HPI     Past Medical History:  Diagnosis Date  . Arthritis   . Asthma    no inhaler  . Coronary artery disease    cardiologist-  dr spruill; last visit 3 mos ago per pt  . Enterobacter sepsis ([place]) 04/13/2020  . GERD (gastroesophageal reflux disease)   . History of MI (myocardial infarction)    1985  . Hydronephrosis, left   . Hypertension   . Myocardial infarction ([place])   . Nephrolithiasis    left  . 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 Presence of tooth-root and mandibular implants    lower  dental implants  . Prostate cancer ([place])   . 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 Shortness of breath    WITH EXERTION  . Thigh abscess 04/13/2020  . Type 2 diabetes mellitus [facility])     Patient Active Problem List   Diagnosis Date Noted  . 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 Enterobacter sepsis ([place]) 04/13/2020  . Thigh abscess 04/13/2020  . CKD (chronic kidney disease) 04/13/2020  . Bacteremia   . SBO (small bowel obstruction) ([place])   . Abdominal distension   . Effusion of hip joint, left   . Hip pain 03/03/2020  . Gout of right hand 02/23/2020  . Acute renal failure (ARF) ([place]) 01/14/2020  . Hyperkalemia 01/14/2020  . Hydroureteronephrosis 01/14/2020  . Colon cancer screening 04/28/2019  . Intermittent claudication ([place]) 01/22/2019  . GERD (gastroesophageal reflux disease) 10/23/2018  . Pseudophakia 08/11/2016  . Uncontrolled type 2 diabetes mellitus with hyperglycemia, without long-term current use of insulin ([place]) 06/26/2016  . Hyperlipidemia with target LDL less than 70 06/26/2016  . Angina pectoris ([place]) 06/26/2016  . Type 2 diabetes mellitus with hypoglycemia without coma ([place]) 06/24/2016  . Essential hypertension 06/24/2016  . CAD (coronary artery disease) 06/24/2016  . Asthma 06/24/2016  . Malignant neoplasm of prostate ([place]) 12/28/2015  . OA (osteoarthritis) of hip 04/09/2012    Past Surgical History:  Procedure Laterality Date  . BUNIONECTOMY    . CYSTOSCOPY W/ RETROGRADES Left 03/14/2014   Procedure: CYSTOSCOPY WITH LEFT RETROGRADE PYELOGRAM,  Left Ureteroscopy, Lweft ureteral Stent No string;  Surgeon: [name], MD;  Location: [facility];  Service: Urology;  Laterality: Left;  . CYSTOSCOPY W/ URETERAL STENT PLACEMENT Bilateral 01/15/2020   Procedure: CYSTOSCOPY WITH RETROGRADE PYELOGRAM/URETERAL STENT PLACEMENT;  Surgeon: Alexis Frock, MD;  Location: [facility];  Service: Urology;  Laterality: Bilateral;  .  CYSTOSCOPY/URETEROSCOPY/HOLMIUM LASER/STENT PLACEMENT Bilateral 03/17/2020   Procedure: CYSTOSCOPY/URETEROSCOPY/HOLMIUM LASER/STENT LEFT PLACEMENT;  Surgeon: Festus Aloe, MD;  Location: [facility];  Service: Urology;  Laterality: Bilateral;  . HERNIA REPAIR    . INCISION AND DRAINAGE Left 03/09/2020   Procedure: INCISION AND DRAINAGE LEFT HIP WITH LINER EXCHANGE;  Surgeon: Gaynelle Arabian, MD;  Location: [facility];  Service: Orthopedics;  Laterality: Left;  . IR FLUORO GUIDE CV LINE RIGHT  03/10/2020  . IR RADIOLOGIST EVAL & MGMT  04/01/2020  . IR RADIOLOGIST EVAL & MGMT  04/15/2020  . IR RADIOLOGIST EVAL & MGMT  04/30/2020  . IR REMOVAL TUN CV CATH W/O FL  04/22/2020  . IR US GUIDE BX ASP/DRAIN  03/17/2020  . IR US GUIDE VASC ACCESS RIGHT  03/10/2020  . PROSTATE BIOPSY    . TOTAL HIP ARTHROPLASTY Left 03-20-2009  . TOTAL HIP ARTHROPLASTY  04/09/2012   Procedure: TOTAL HIP ARTHROPLASTY;  Surgeon: [name], MD;  Location: [facility];  Service: Orthopedics;  Laterality: Right;  . TOTAL KNEE ARTHROPLASTY Left 05-16-2008       Family History  Problem Relation Age of Onset  . Cancer Mother        breast  . Multiple sclerosis Daughter   . Cancer Father        prostate  . Cancer Maternal Uncle        bone    Social History   Tobacco Use  . Smoking status: Former Smoker    Packs/day: 1.00    Years: 25.00    Pack years: 25.00    Types: Cigarettes    Quit date: 04/11/1984    Years since quitting: 36.1  . Smokeless tobacco: Former Systems developer    Quit date: 04/02/1985  Vaping Use  . Vaping Use: Never used  Substance Use Topics  . Alcohol use: Not Currently    Comment: RARE  . Drug use: No    Home Medications Prior to Admission medications   Medication Sig Start Date End Date Taking? Authorizing Provider  albuterol (VENTOLIN HFA) 108 (90 Base) MCG/ACT inhaler Inhale 2 puffs into the lungs every 6 (six) hours as needed for wheezing or shortness of breath. 08/09/19 03/03/20  Luetta Nutting,  DO  amiodarone (PACERONE) 200 MG tablet Take 1 tablet (200 mg total) by mouth daily. 03/21/20   Caren Griffins, MD  Aspirin 81 MG CAPS Take 81 mg by mouth daily.     [provider]  atorvastatin (LIPITOR) 40 MG tablet Take 1 tablet (40 mg total) by mouth daily. 06/26/16   Orson Eva, MD  co-enzyme Q-10 30 MG capsule Take 30 mg by mouth daily.    [provider]  Continuous Blood Gluc Receiver (FREESTYLE LIBRE 14 DAY READER) [place] See admin instructions. 05/23/19   [provider]  Continuous Blood Gluc Sensor (FREESTYLE LIBRE 14 DAY SENSOR) MISC 1 Device by Does not apply route every 14 (fourteen) days. 05/23/19   Luetta Nutting, DO  cycloSPORINE (RESTASIS) 0.05 % ophthalmic emulsion Place 1 drop into both eyes 2 (two) times daily.  09/23/19   [provider]  diclofenac Sodium (VOLTAREN) 1 %  GEL Apply 4 g topically 4 (four) times daily. 03/01/20   Deno Etienne, DO  diltiazem (CARDIZEM CD) 120 MG 24 hr capsule Take 1 capsule (120 mg total) by mouth daily. 03/21/20   Caren Griffins, MD  docusate sodium (COLACE) 100 MG capsule Take 100 mg by mouth daily as needed for mild constipation.    [provider]  ferrous sulfate 325 (65 FE) MG tablet Take 325 mg by mouth daily with breakfast.    [provider]  gabapentin (NEURONTIN) 300 MG capsule Take 1 capsule (300 mg total) by mouth 3 (three) times daily. Start 300mg  at bedtime x1 week then may increase to three times per day. Patient taking differently: Take 300 mg by mouth in the morning, at noon, and at bedtime. 10/21/19   Luetta Nutting, DO  glimepiride (AMARYL) 4 MG tablet Take 4 mg by mouth daily before breakfast.    [provider]  omeprazole (PRILOSEC) 40 MG capsule Prilosec 40 mg capsule,delayed release   40 mg by oral route. 06/26/16   [provider]  predniSONE (DELTASONE) 50 MG tablet TAKE DAILY UNTIL RESOLUTION OF GOUT SYMPTOMS. 04/21/20   Luetta Nutting, DO  sitaGLIPtin  (JANUVIA) 50 MG tablet Take 1 tablet (50 mg total) by mouth daily. 06/26/16   Orson Eva, MD  torsemide (DEMADEX) 20 MG tablet Take 20 mg by mouth daily. 04/15/20   [provider]    Allergies    Shellfish allergy  Review of Systems   Review of Systems  Constitutional: Negative.   HENT: Negative.   Eyes: Negative.   Respiratory: Negative.   Cardiovascular: Negative.   Gastrointestinal: Negative.   Genitourinary: Negative.   Musculoskeletal: Negative.   Skin: Negative.   Neurological: Negative.     Physical Exam Updated Vital Signs BP (!) 162/79 (BP Location: Left Arm)   Pulse 67   Temp 98.1 F (36.7 C) (Oral)   Resp 13   SpO2 98%   Physical Exam Vitals and nursing note reviewed.  Constitutional:      Appearance: He is obese.  HENT:     Head: Normocephalic and atraumatic.     Nose: Nose normal.     Mouth/Throat:     Mouth: Mucous membranes are moist.     Pharynx: Oropharynx is clear. Uvula midline. No oropharyngeal exudate or posterior oropharyngeal erythema.     Tonsils: No tonsillar exudate.  Eyes:     General: Lids are normal. Vision grossly intact.        Right eye: No discharge.        Left eye: No discharge.     Extraocular Movements: Extraocular movements intact.     Conjunctiva/sclera: Conjunctivae normal.     Pupils: Pupils are equal, round, and reactive to light.  Neck:     Trachea: Trachea and phonation normal.  Cardiovascular:     Rate and Rhythm: Normal rate and regular rhythm.     Pulses: Normal pulses.     Heart sounds: Normal heart sounds. No murmur heard.   Pulmonary:     Effort: Pulmonary effort is normal. No respiratory distress.     Breath sounds: Normal breath sounds. No wheezing or rales.  Chest:     Chest wall: No swelling, tenderness, crepitus or edema.  Abdominal:     General: Bowel sounds are normal. There is no distension.     Palpations: Abdomen is soft.     Tenderness: There is no abdominal tenderness. There is no left  CVA tenderness, guarding or rebound.  Musculoskeletal:        General: No deformity.     Cervical back: Normal range of motion and neck supple. No crepitus. No pain with movement, spinous process tenderness or muscular tenderness.     Right lower leg: No edema.     Left lower leg: No edema.  Lymphadenopathy:     Cervical: No cervical adenopathy.  Skin:    General: Skin is warm and dry.     Capillary Refill: Capillary refill takes less than 2 seconds.  Neurological:     Mental Status: He is alert and oriented to person, place, and time. Mental status is at baseline.     Cranial Nerves: Cranial nerves are intact.     Sensory: Sensation is intact.     Motor: Motor function is intact.     Gait: Gait is intact.  Psychiatric:        Mood and Affect: Mood normal.     ED Results / Procedures / Treatments   Labs (all labs ordered are listed, but only abnormal results are displayed) Labs Reviewed  BASIC METABOLIC PANEL - Abnormal; Notable for the following components:      Result Value   Sodium 134 (*)    Potassium 5.9 (*)    CO2 17 (*)    Glucose, Bld 188 (*)    BUN 34 (*)    Creatinine, Ser 1.95 (*)    GFR, Estimated 35 (*)    All other components within normal limits  CBC - Abnormal; Notable for the following components:   RBC 3.95 (*)    Hemoglobin 11.8 (*)    HCT 36.2 (*)    RDW 16.1 (*)    Platelets 448 (*)    All other components within normal limits  URINALYSIS, ROUTINE W REFLEX MICROSCOPIC - Abnormal; Notable for the following components:   Protein, ur 100 (*)    Bacteria, UA RARE (*)    All other components within normal limits  CBG MONITORING, ED    EKG EKG Interpretation  Date/Time:  Thursday May 14 2020 17:08:33 EST Ventricular Rate:  72 PR Interval:  238 QRS Duration: 108 QT Interval:  389 QTC Calculation: 426 R Axis:   38 Text Interpretation: Sinus rhythm Prolonged PR interval Abnormal R-wave progression, early transition Confirmed by Lennice Sites [phone]) on 05/14/2020 5:58:31 PM   Radiology No results found.  Procedures Procedures   Medications Ordered in ED Medications  sodium zirconium cyclosilicate (LOKELMA) packet 10 g (10 g Oral Given 05/14/20 1729)  sodium chloride 0.9 % bolus 1,000 mL (0 mLs Intravenous Stopped 05/14/20 1952)    ED Course  I have reviewed the triage vital signs and the nursing notes.  Pertinent labs & imaging results that were available during my care of the patient were reviewed by me and considered in my medical decision making (see chart for details).  Clinical Course as of 05/14/20 2014  Thu May 14, 2020  1743 Consult placed to nephrologist, Dr. Joelyn Oms, who recommends discontinuing Bactrim, spironolactone, and potassium supplementation as was already instructed by referring provider.  He recommends repeat laboratory studies in the next 1 to 2 days and follow-up by primary care doctor outpatient. [RS]    Clinical Course User Index [RS] Keilah Lemire, Sharlene Dory   MDM Rules/Calculators/A&P                         75 year old male presents at  the prompting of outpatient provider for management of hyperkalemia and mild AKI.   Hypertensive on intake 160/82, vital signs otherwise normal.  EKG performed in triage is very reassuring.  Cardiopulmonary exam is normal, abdominal exam is benign.  Patient is neurovascular intact in all extremities. Basic laboratory studies obtained in triage, CBC with Mild anemia, hemoglobin 11.2, previously 12.  BMP with hyperkalemia of 5.9, 6.4 this morning.  Additionally BUN/creatinine elevated from baseline, now 34/1.95.  Baseline creatinine 1.1.  We will proceed with dose of Lokelma in the emergency department, and consult to nephrology.   Per consult to nephrology, no indication to admit this patient at this time.  Will discharge after IV fluid bolus, with close PCP follow-up.  Patient reevaluated after completion of IV fluids, vital signs have remained stable  throughout his stay in the emergency department, he continues to have no complaints.  Reassuring physical exam, vital signs, laboratory studies, no further work up is warranted in the ED at this time.  Recommend follow-up tomorrow for repeat laboratory studies either with his infectious disease or with his primary care doctor. Important that his renal function and potassium levels are reevaluated in the next 24-48 hours.   [name] voiced understanding of his medical evaluation and treatment plan.  Each of his questions was answered to his expressed satisfaction.  Return precautions given.  Patient is well-appearing, stable, and appropriate for discharge at this time.  This chart was dictated using voice recognition software, Dragon. Despite the best efforts of this provider to proofread and correct errors, errors may still occur which can change documentation meaning.  Final Clinical Impression(s) / ED Diagnoses Final diagnoses:  Hyperkalemia    Rx / DC Orders ED Discharge Orders    None       Emeline Darling, PA-C 05/14/20 2014    Lennice Sites, DO 05/14/20 2214

## 2020-05-15 ENCOUNTER — Telehealth: Payer: Self-pay | Admitting: *Deleted

## 2020-05-15 ENCOUNTER — Other Ambulatory Visit: Payer: HMO

## 2020-05-15 ENCOUNTER — Other Ambulatory Visit: Payer: Self-pay

## 2020-05-15 DIAGNOSIS — E875 Hyperkalemia: Secondary | ICD-10-CM

## 2020-05-15 LAB — BASIC METABOLIC PANEL
Anion gap: 10 (ref 5–15)
BUN: 29 mg/dL — ABNORMAL HIGH (ref 8–23)
CO2: 19 mmol/L — ABNORMAL LOW (ref 22–32)
Calcium: 9.2 mg/dL (ref 8.9–10.3)
Chloride: 107 mmol/L (ref 98–111)
Creatinine, Ser: 1.87 mg/dL — ABNORMAL HIGH (ref 0.61–1.24)
GFR, Estimated: 37 mL/min — ABNORMAL LOW (ref >60)
Glucose, Bld: 205 mg/dL — ABNORMAL HIGH (ref 70–99)
Potassium: 5.5 mmol/L — ABNORMAL HIGH (ref 3.5–5.1)
Sodium: 136 mmol/L (ref 135–145)

## 2020-05-15 NOTE — Telephone Encounter (Signed)
[place] for repeat BMP per Dr Tommy Medal. As it is scheduled for Friday afternoon, he would like this run STAT to prevent the possibility of having to send patient back to the ER in the middle of the night if at all possible. Landis Gandy, RN

## 2020-05-15 NOTE — Telephone Encounter (Signed)
Transition Care Management Follow-up Telephone Call  Date of discharge and from where: 05/14/2020 - [facility]  How have you been since you were released from the hospital? "Feeling about the same"  Any questions or concerns? No  Items Reviewed:  Did the pt receive and understand the discharge instructions provided? Yes   Medications obtained and verified? Yes   Other? N/A  Any new allergies since your discharge? No   Dietary orders reviewed? Yes  Do you have support at home? Yes   Home Care and Equipment/Supplies: Were home health services ordered? not applicable If so, what is the name of the agency? N/A  Has the agency set up a time to come to the patient's home? not applicable Were any new equipment or medical supplies ordered?  No What is the name of the medical supply agency? N/A Were you able to get the supplies/equipment? not applicable Do you have any questions related to the use of the equipment or supplies? No  Functional Questionnaire: (I = Independent and D = Dependent) ADLs: I  Bathing/Dressing- I  Meal Prep- I  Eating- I  Maintaining continence- I  Transferring/Ambulation- I  Managing Meds- I  Follow up appointments reviewed:   PCP Hospital f/u appt confirmed? No    Specialist Hospital f/u appt confirmed? Yes  Scheduled to see Dr. Tommy Medal with Infectious Disease on 05/19/2020 @ 1045.  Are transportation arrangements needed? No   If their condition worsens, is the pt aware to call PCP or go to the Emergency Dept.? Yes  Was the patient provided with contact information for the PCP's office or ED? Yes  Was to pt encouraged to call back with questions or concerns? Yes

## 2020-05-18 ENCOUNTER — Telehealth: Payer: Self-pay

## 2020-05-18 ENCOUNTER — Ambulatory Visit (INDEPENDENT_AMBULATORY_CARE_PROVIDER_SITE_OTHER): Payer: HMO | Admitting: Family Medicine

## 2020-05-18 ENCOUNTER — Other Ambulatory Visit: Payer: Self-pay

## 2020-05-18 ENCOUNTER — Encounter: Payer: Self-pay | Admitting: Family Medicine

## 2020-05-18 VITALS — BP 157/82 | HR 78 | Wt 216.0 lb

## 2020-05-18 DIAGNOSIS — E11649 Type 2 diabetes mellitus with hypoglycemia without coma: Secondary | ICD-10-CM

## 2020-05-18 DIAGNOSIS — I10 Essential (primary) hypertension: Secondary | ICD-10-CM | POA: Diagnosis not present

## 2020-05-18 DIAGNOSIS — M10041 Idiopathic gout, right hand: Secondary | ICD-10-CM

## 2020-05-18 DIAGNOSIS — E875 Hyperkalemia: Secondary | ICD-10-CM | POA: Diagnosis not present

## 2020-05-18 DIAGNOSIS — N200 Calculus of kidney: Secondary | ICD-10-CM | POA: Diagnosis not present

## 2020-05-18 DIAGNOSIS — T8459XD Infection and inflammatory reaction due to other internal joint prosthesis, subsequent encounter: Secondary | ICD-10-CM

## 2020-05-18 DIAGNOSIS — Z96649 Presence of unspecified artificial hip joint: Secondary | ICD-10-CM

## 2020-05-18 DIAGNOSIS — M009 Pyogenic arthritis, unspecified: Secondary | ICD-10-CM | POA: Diagnosis not present

## 2020-05-18 DIAGNOSIS — I208 Other forms of angina pectoris: Secondary | ICD-10-CM | POA: Diagnosis not present

## 2020-05-18 MED ORDER — COLCHICINE 0.6 MG PO TABS
0.6000 mg | ORAL_TABLET | Freq: Every day | ORAL | 1 refills | Status: DC
Start: 2020-05-18 — End: 2020-06-10

## 2020-05-18 NOTE — Assessment & Plan Note (Signed)
Hyperkalemia and AKI improving. Bactrim and potassium d/c'ed.  Recheck BMP today.

## 2020-05-18 NOTE — Progress Notes (Signed)
[name] - 75 y.o. male MRN [id number]  Date of birth: Feb 11, 1946  Subjective Chief Complaint  Patient presents with  . Hospitalization Follow-up    HPI [name] is a 75 y.o. male here today for follow up of gout and recent hospital visit.   He is being treated for infected hip prosthesis.  Has had I&D with Dr. Wynelle Link and is followed by ID.  Completed course of cefepime for Enterobacter infection.  He was transitioned to bactrim however follow up labs with hyperkalemia and acute kidney injury. He had also been started on potassium in addition to aldactone by cardiology.   Seen in ED and given Lokelma and fluids.  Nephrology consulted and felt to not need admission.  F/u labs at ID with improvement in potassium and renal functon.    Seen by cardiologist today and several medications discontinued including diltiazem, torsemide and amiodarone.    Continues to have problems with gout.  He was previously treated with steroids and had improvement, however as soon as he stopped swelling and pain in R hand and foot would return.  Denies pain in other joints.   ROS:  A comprehensive ROS was completed and negative except as noted per HPI  Allergies  Allergen Reactions  . Shellfish Allergy Rash    Past Medical History:  Diagnosis Date  . Arthritis   . Asthma    no inhaler  . Coronary artery disease    cardiologist-  dr spruill; last visit 3 mos ago per pt  . Enterobacter sepsis ([place]) 04/13/2020  . GERD (gastroesophageal reflux disease)   . History of MI (myocardial infarction)    1985  . Hydronephrosis, left   . Hypertension   . Myocardial infarction ([place])   . Nephrolithiasis    left  . 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 Presence of tooth-root and mandibular implants    lower dental implants  . Prostate cancer ([place])   . Prosthetic hip infection ([place]) 04/13/2020  . Shortness of breath    WITH EXERTION  . Thigh abscess 04/13/2020  . Type 2  diabetes mellitus ([place])     Past Surgical History:  Procedure Laterality Date  . BUNIONECTOMY    . CYSTOSCOPY W/ RETROGRADES Left 03/14/2014   Procedure: CYSTOSCOPY WITH LEFT RETROGRADE PYELOGRAM, Left Ureteroscopy, [place]weft ureteral Stent No string;  Surgeon: [name], MD;  Location: [facility];  Service: Urology;  Laterality: Left;  . CYSTOSCOPY W/ URETERAL STENT PLACEMENT Bilateral 01/15/2020   Procedure: CYSTOSCOPY WITH RETROGRADE PYELOGRAM/URETERAL STENT PLACEMENT;  Surgeon: Alexis Frock, MD;  [facility];  Service: Urology;  Laterality: Bilateral;  . CYSTOSCOPY/URETEROSCOPY/HOLMIUM LASER/STENT PLACEMENT Bilateral 03/17/2020   Procedure: CYSTOSCOPY/URETEROSCOPY/HOLMIUM LASER/STENT LEFT PLACEMENT;  Surgeon: Festus Aloe, MD;  Location: [facility];  Service: Urology;  Laterality: Bilateral;  . HERNIA REPAIR    . INCISION AND DRAINAGE Left 03/09/2020   Procedure: INCISION AND DRAINAGE LEFT HIP WITH LINER EXCHANGE;  Surgeon: Gaynelle Arabian, MD;  Location[facility];  Service: Orthopedics;  Laterality: Left;  . IR FLUORO GUIDE CV LINE RIGHT  03/10/2020  . IR RADIOLOGIST EVAL & MGMT  04/01/2020  . IR RADIOLOGIST EVAL & MGMT  04/15/2020  . IR RADIOLOGIST EVAL & MGMT  04/30/2020  . IR REMOVAL TUN CV CATH W/O FL  04/22/2020  . IR US GUIDE BX ASP/DRAIN  03/17/2020  . IR US GUIDE VASC ACCESS RIGHT  03/10/2020  . PROSTATE BIOPSY    .  TOTAL HIP ARTHROPLASTY Left 03-20-2009  . TOTAL HIP ARTHROPLASTY  04/09/2012   Procedure: TOTAL HIP ARTHROPLASTY;  Surgeon: [name], MD;  Location: [facility];  Service: Orthopedics;  Laterality: Right;  . TOTAL KNEE ARTHROPLASTY Left 05-16-2008    Social History   Socioeconomic History  . Marital status: Widowed    Spouse name: Not on file  . Number of children: Not on file  . Years of education: Not on file  . Highest education level: Not on file  Occupational History  . Not on file  Tobacco Use  . Smoking status: Former Smoker     Packs/day: 1.00    Years: 25.00    Pack years: 25.00    Types: Cigarettes    Quit date: 04/11/1984    Years since quitting: 36.1  . Smokeless tobacco: Former Systems developer    Quit date: 04/02/1985  Vaping Use  . Vaping Use: Never used  Substance and Sexual Activity  . Alcohol use: Not Currently    Comment: RARE  . Drug use: No  . Sexual activity: Not Currently  Other Topics Concern  . Not on file  Social History Narrative  . Not on file   Social Determinants of Health   Financial Resource Strain: Not on file  Food Insecurity: Not on file  Transportation Needs: Not on file  Physical Activity: Not on file  Stress: Not on file  Social Connections: Not on file    Family History  Problem Relation Age of Onset  . Cancer Mother        breast  . Multiple sclerosis Daughter   . Cancer Father        prostate  . Cancer Maternal Uncle        bone    Health Maintenance  Topic Date Due  . URINE MICROALBUMIN  10/23/2019  . FOOT EXAM  01/22/2020  . COVID-19 Vaccine (3 - Moderna risk 4-dose series) 04/15/2020  . HEMOGLOBIN A1C  06/18/2020  . OPHTHALMOLOGY EXAM  09/22/2020  . TETANUS/TDAP  04/10/2025  . COLONOSCOPY (Pts 45-5yrs Insurance coverage will need to be confirmed)  11/15/2028  . INFLUENZA VACCINE  Completed  . Hepatitis C Screening  Completed  . PNA vac Low Risk Adult  Completed     ----------------------------------------------------------------------------------------------------------------------------------------------------------------------------------------------------------------- Physical Exam BP (!) 157/82 (BP Location: Left Arm, Patient Position: Sitting, Cuff Size: Large)   Pulse 78   Wt 216 lb (98 kg)   SpO2 96%   BMI 34.86 kg/m   Physical Exam Constitutional:      Appearance: Normal appearance.  HENT:     Head: Normocephalic and atraumatic.  Eyes:     General: No scleral icterus. Cardiovascular:     Rate and Rhythm: Normal rate and regular rhythm.   Pulmonary:     Effort: Pulmonary effort is normal.     Breath sounds: Normal breath sounds.  Musculoskeletal:     Comments: Mild swelling of R hand and foot.    Skin:    Comments: fluctuant area over L hip  Neurological:     General: No focal deficit present.     Mental Status: He is alert.  Psychiatric:        Mood and Affect: Mood normal.        Behavior: Behavior normal.     ------------------------------------------------------------------------------------------------------------------------------------------------------------------------------------------------------------------- Assessment and Plan  Hyperkalemia Hyperkalemia and AKI improving. Bactrim and potassium d/c'ed.  Recheck BMP today.    Prosthetic hip infection ([place]) Following up with Orthopedics and ID  later this week.   Gout of right hand Will try changing to colchicine, 0.6mg  daily.  Renal insufficiency likely contributing.  Check uric acid levels. We can continue at this dose as long as renal function remains stable.     Meds ordered this encounter  Medications  . colchicine 0.6 MG tablet    Sig: Take 1 tablet (0.6 mg total) by mouth daily.    Dispense:  30 tablet    Refill:  1    Return in about 4 weeks (around 06/15/2020) for gout.    This visit occurred during the SARS-CoV-2 public health emergency.  Safety protocols were in place, including screening questions prior to the visit, additional usage of staff PPE, and extensive cleaning of exam room while observing appropriate contact time as indicated for disinfecting solutions.

## 2020-05-18 NOTE — Assessment & Plan Note (Signed)
Will try changing to colchicine, 0.6mg  daily.  Renal insufficiency likely contributing.  Check uric acid levels. We can continue at this dose as long as renal function remains stable.

## 2020-05-18 NOTE — Telephone Encounter (Signed)
[facility] Patient Advocate Encounter   Received notification from [facility] that prior authorization for [name] is required.   PA submitted on 05/18/20 [place]  [id number] Status is pending    [facility] will continue to follow.   Ileene Patrick, [facility] Patient [facility] for Infectious Disease Phone: [phone] Fax:  [phone]

## 2020-05-18 NOTE — Assessment & Plan Note (Signed)
Following up with Orthopedics and ID later this week.

## 2020-05-18 NOTE — Patient Instructions (Signed)
Start colchicine 0.6mg  daily.  Have labs completed.  See me again in 1 month.

## 2020-05-19 ENCOUNTER — Ambulatory Visit: Payer: HMO | Admitting: Infectious Disease

## 2020-05-19 ENCOUNTER — Telehealth: Payer: Self-pay

## 2020-05-19 ENCOUNTER — Other Ambulatory Visit: Payer: Self-pay | Admitting: Family Medicine

## 2020-05-19 VITALS — BP 177/75 | HR 93 | Wt 216.0 lb

## 2020-05-19 DIAGNOSIS — B9689 Other specified bacterial agents as the cause of diseases classified elsewhere: Secondary | ICD-10-CM

## 2020-05-19 DIAGNOSIS — M1A9XX0 Chronic gout, unspecified, without tophus (tophi): Secondary | ICD-10-CM

## 2020-05-19 DIAGNOSIS — R7881 Bacteremia: Secondary | ICD-10-CM

## 2020-05-19 DIAGNOSIS — E875 Hyperkalemia: Secondary | ICD-10-CM

## 2020-05-19 DIAGNOSIS — A4159 Other Gram-negative sepsis: Secondary | ICD-10-CM

## 2020-05-19 DIAGNOSIS — K219 Gastro-esophageal reflux disease without esophagitis: Secondary | ICD-10-CM | POA: Diagnosis not present

## 2020-05-19 DIAGNOSIS — N183 Chronic kidney disease, stage 3 unspecified: Secondary | ICD-10-CM

## 2020-05-19 DIAGNOSIS — M00852 Arthritis due to other bacteria, left hip: Secondary | ICD-10-CM | POA: Diagnosis not present

## 2020-05-19 DIAGNOSIS — Z96649 Presence of unspecified artificial hip joint: Secondary | ICD-10-CM

## 2020-05-19 DIAGNOSIS — T8452XA Infection and inflammatory reaction due to internal left hip prosthesis, initial encounter: Secondary | ICD-10-CM

## 2020-05-19 DIAGNOSIS — E1165 Type 2 diabetes mellitus with hyperglycemia: Secondary | ICD-10-CM

## 2020-05-19 DIAGNOSIS — N179 Acute kidney failure, unspecified: Secondary | ICD-10-CM

## 2020-05-19 LAB — HEPATIC FUNCTION PANEL
AG Ratio: 1.1 (calc) (ref 1.0–2.5)
ALT: 12 U/L (ref 9–46)
AST: 12 U/L (ref 10–35)
Albumin: 4 g/dL (ref 3.6–5.1)
Alkaline phosphatase (APISO): 104 U/L (ref 35–144)
Bilirubin, Direct: 0.1 mg/dL (ref 0.0–0.2)
Globulin: 3.6 g/dL (calc) (ref 1.9–3.7)
Indirect Bilirubin: 0.2 mg/dL (calc) (ref 0.2–1.2)
Total Bilirubin: 0.3 mg/dL (ref 0.2–1.2)
Total Protein: 7.6 g/dL (ref 6.1–8.1)

## 2020-05-19 LAB — BASIC METABOLIC PANEL
BUN/Creatinine Ratio: 18 (calc) (ref 6–22)
BUN: 34 mg/dL — ABNORMAL HIGH (ref 7–25)
CO2: 22 mmol/L (ref 20–32)
Calcium: 8.9 mg/dL (ref 8.6–10.3)
Chloride: 101 mmol/L (ref 98–110)
Creat: 1.86 mg/dL — ABNORMAL HIGH (ref 0.70–1.18)
Glucose, Bld: 180 mg/dL — ABNORMAL HIGH (ref 65–139)
Potassium: 5 mmol/L (ref 3.5–5.3)
Sodium: 135 mmol/L (ref 135–146)

## 2020-05-19 LAB — LIPID PANEL W/REFLEX DIRECT LDL
Cholesterol: 223 mg/dL — ABNORMAL HIGH (ref <200)
HDL: 45 mg/dL (ref >=40)
LDL Cholesterol (Calc): 146 mg/dL (calc) — ABNORMAL HIGH
Non-HDL Cholesterol (Calc): 178 mg/dL (calc) — ABNORMAL HIGH (ref <130)
Total CHOL/HDL Ratio: 5 (calc) — ABNORMAL HIGH (ref <5.0)
Triglycerides: 182 mg/dL — ABNORMAL HIGH (ref <150)

## 2020-05-19 LAB — HEMOGLOBIN A1C
Hgb A1c MFr Bld: 9.3 % of total Hgb — ABNORMAL HIGH (ref <5.7)
Mean Plasma Glucose: 220 mg/dL
eAG (mmol/L): 12.2 mmol/L

## 2020-05-19 LAB — URIC ACID: Uric Acid, Serum: 8.2 mg/dL — ABNORMAL HIGH (ref 4.0–8.0)

## 2020-05-19 MED ORDER — NUZYRA 150 MG PO TABS: 1.0000 | ORAL_TABLET | Freq: Every day | ORAL | 11 refills | Status: DC

## 2020-05-19 MED ORDER — ROSUVASTATIN CALCIUM 40 MG PO TABS: 40.0000 mg | ORAL_TABLET | Freq: Every day | ORAL | 3 refills | Status: DC

## 2020-05-19 MED ORDER — ALLOPURINOL 100 MG PO TABS: 50.0000 mg | ORAL_TABLET | Freq: Every day | ORAL | 1 refills | Status: DC

## 2020-05-19 MED ORDER — LEVOFLOXACIN 250 MG PO TABS: ORAL_TABLET | ORAL | 4 refills | Status: DC

## 2020-05-19 NOTE — Telephone Encounter (Signed)
Called the pharmacy and cancelled the levaquin prescription. Notified them that provider sent in new prescription for Nuzyra. Pharmacy confirmed.   Addylin Manke Lorita Officer, RN

## 2020-05-19 NOTE — Patient Instructions (Addendum)
Fast for 4 hours before the Nuzyra and 2 hours after it  If you have nausea let me know and I can send in something for that

## 2020-05-19 NOTE — Progress Notes (Signed)
Subjective:  Chief complaint followup for PJI    Patient ID: [name], male    DOB: [birth year], 75 y.o.   MRN: [id number]  HPI   75 year old Black man with PMHx of CAD, DM2, HTN, CKD, OA, s/p left hip arthoplasty (2001), prostate cancer, PAF, left ureteral stone s/p stenting (01/2020) who was sent from ortho clinic on 11/23 by Dr Wynelle Link. He is growing Enterobacter from blood, urine and hip aspirate. He is sp surgery with Irrigation and debridement, left hip, with bearing surface exchange. Enterobacter grew on cultures.  We had him scheduled to complete antibiotics on 10 January.  In the interim he developed a fluid collection in his thigh that was drained and seemed rather bloody but which also grew Enterobacter from culture.  Since I last saw him he finished his cefepime and we started bactrim DS BID.  Supposed to have had a metabolic panel shortly after starting that regimen but has not had that done yet.  He has been seen by interventional radiology and his drain output became minimal and this was now been removed on his thigh.  He has noticed in the last few days an area that is a "bump that is long the proximal aspect of his incision.  There is an area that is palpable there yesterday see my picture.  It is not clearly fluctuant but does need attention and he does need to see Dr. [name].  Last week we checked his metabolic panel stat and fortunately his potassium came back at 6.5 okay and creatinine 2.13  I called him last night and he came in and had repeat BMP  I asked him to also bring all of his medications with him.  When he came to the clinic today I found that not only was he on the spironolactone that is listed on his med list but which he did not recognize me I talked him over the phone but he was also on potassium chloride that Dr. [name] had prescribed to him he he says in a visit right after I prescribed him the Bactrim.  We obtained an EKG which shows normal  sinus rhythm with some first-degree AV block and in my view peaked T waves in the anteroseptal leads but not in the inferior or lateral leads.  There are no significant T wave inversions.  Repeat BMP showed similar K and Cr elevation  We had him go to ER Sharyn Lull brought him there in wheelchair) and he had treatment for hyperkalemia there and we did repeat labs on Friday and he did again yesterday with PCP.  He is also being treated for gout in hand and foot.  We obtained [place] PA though due to his deductible not having yet been met he would have to pay roughly $2k upfront.         Past Medical History:  Diagnosis Date  . Arthritis   . Asthma    no inhaler  . Coronary artery disease    cardiologist-  dr spruill; last visit 3 mos ago per pt  . Enterobacter sepsis ([place]) 04/13/2020  . GERD (gastroesophageal reflux disease)   . History of MI (myocardial infarction)    1985  . Hydronephrosis, left   . Hypertension   . Myocardial infarction ([place])   . Nephrolithiasis    left  . 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 Presence of tooth-root and mandibular implants    lower dental implants  .  Prostate cancer ([place])   . 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 Shortness of breath    WITH EXERTION  . Thigh abscess 04/13/2020  . Type 2 diabetes mellitus ([place])     Past Surgical History:  Procedure Laterality Date  . BUNIONECTOMY    . CYSTOSCOPY W/ RETROGRADES Left 03/14/2014   Procedure: CYSTOSCOPY WITH LEFT RETROGRADE PYELOGRAM, Left Ureteroscopy, [place] ureteral Stent No string;  Surgeon: [name], MD;  Location: [facility];  Service: Urology;  Laterality: Left;  . CYSTOSCOPY W/ URETERAL STENT PLACEMENT Bilateral 01/15/2020   Procedure: CYSTOSCOPY WITH RETROGRADE PYELOGRAM/URETERAL STENT PLACEMENT;  Surgeon: Alexis Frock, MD;  Location: [facility];  Service: Urology;  Laterality: Bilateral;  . CYSTOSCOPY/URETEROSCOPY/HOLMIUM LASER/STENT  PLACEMENT Bilateral 03/17/2020   Procedure: CYSTOSCOPY/URETEROSCOPY/HOLMIUM LASER/STENT LEFT PLACEMENT;  Surgeon: Festus Aloe, MD;  Location: [facility];  Service: Urology;  Laterality: Bilateral;  . HERNIA REPAIR    . INCISION AND DRAINAGE Left 03/09/2020   Procedure: INCISION AND DRAINAGE LEFT HIP WITH LINER EXCHANGE;  Surgeon: Gaynelle Arabian, MD;  Location: [facility];  Service: Orthopedics;  Laterality: Left;  . IR FLUORO GUIDE CV LINE RIGHT  03/10/2020  . IR RADIOLOGIST EVAL & MGMT  04/01/2020  . IR RADIOLOGIST EVAL & MGMT  04/15/2020  . IR RADIOLOGIST EVAL & MGMT  04/30/2020  . IR REMOVAL TUN CV CATH W/O FL  04/22/2020  . IR US GUIDE BX ASP/DRAIN  03/17/2020  . IR US GUIDE VASC ACCESS RIGHT  03/10/2020  . PROSTATE BIOPSY    . TOTAL HIP ARTHROPLASTY Left 03-20-2009  . TOTAL HIP ARTHROPLASTY  04/09/2012   Procedure: TOTAL HIP ARTHROPLASTY;  Surgeon: [name], MD;  Location: [facility];  Service: Orthopedics;  Laterality: Right;  . TOTAL KNEE ARTHROPLASTY Left 05-16-2008    Family History  Problem Relation Age of Onset  . Cancer Mother        breast  . Multiple sclerosis Daughter   . Cancer Father        prostate  . Cancer Maternal Uncle        bone      Social History   Socioeconomic History  . Marital status: Widowed    Spouse name: Not on file  . Number of children: Not on file  . Years of education: Not on file  . Highest education level: Not on file  Occupational History  . Not on file  Tobacco Use  . Smoking status: Former Smoker    Packs/day: 1.00    Years: 25.00    Pack years: 25.00    Types: Cigarettes    Quit date: 04/11/1984    Years since quitting: 36.1  . Smokeless tobacco: Former Systems developer    Quit date: 04/02/1985  Vaping Use  . Vaping Use: Never used  Substance and Sexual Activity  . Alcohol use: Not Currently    Comment: RARE  . Drug use: No  . Sexual activity: Not Currently  Other Topics Concern  . Not on file  Social History Narrative  . Not on  file   Social Determinants of Health   Financial Resource Strain: Not on file  Food Insecurity: Not on file  Transportation Needs: Not on file  Physical Activity: Not on file  Stress: Not on file  Social Connections: Not on file    Allergies  Allergen Reactions  . Shellfish Allergy Rash     Current Outpatient Medications:  .  rosuvastatin (CRESTOR) 40 MG tablet, Take 1 tablet (  40 mg total) by mouth daily., Disp: 90 tablet, Rfl: 3 .  albuterol (VENTOLIN HFA) 108 (90 Base) MCG/ACT inhaler, Inhale 2 puffs into the lungs every 6 (six) hours as needed for wheezing or shortness of breath., Disp: 6.7 g, Rfl: 6 .  allopurinol (ZYLOPRIM) 100 MG tablet, Take 0.5 tablets (50 mg total) by mouth daily., Disp: 45 tablet, Rfl: 1 .  Aspirin 81 MG CAPS, Take 81 mg by mouth daily.  (Patient not taking: Reported on 05/18/2020), Disp: , Rfl:  .  co-enzyme Q-10 30 MG capsule, Take 30 mg by mouth daily. (Patient not taking: Reported on 05/18/2020), Disp: , Rfl:  .  colchicine 0.6 MG tablet, Take 1 tablet (0.6 mg total) by mouth daily., Disp: 30 tablet, Rfl: 1 .  Continuous Blood Gluc Receiver (FREESTYLE LIBRE 99 DAY READER) DEVI, See admin instructions., Disp: , Rfl:  .  Continuous Blood Gluc Sensor (FREESTYLE LIBRE 14 DAY SENSOR) MISC, 1 Device by Does not apply route every 14 (fourteen) days., Disp: 6 each, Rfl: 1 .  cycloSPORINE (RESTASIS) 0.05 % ophthalmic emulsion, Place 1 drop into both eyes 2 (two) times daily. , Disp: , Rfl:  .  diclofenac Sodium (VOLTAREN) 1 % GEL, Apply 4 g topically 4 (four) times daily. (Patient not taking: Reported on 05/18/2020), Disp: 100 g, Rfl: 0 .  docusate sodium (COLACE) 100 MG capsule, Take 100 mg by mouth daily as needed for mild constipation. (Patient not taking: Reported on 05/18/2020), Disp: , Rfl:  .  ferrous sulfate 325 (65 FE) MG tablet, Take 325 mg by mouth daily with breakfast. (Patient not taking: Reported on 05/18/2020), Disp: , Rfl:  .  gabapentin (NEURONTIN) 300 MG  capsule, Take 1 capsule (300 mg total) by mouth 3 (three) times daily. Start 374m at bedtime x1 week then may increase to three times per day. (Patient not taking: Reported on 05/18/2020), Disp: 90 capsule, Rfl: 3 .  glimepiride (AMARYL) 4 MG tablet, Take 4 mg by mouth daily before breakfast., Disp: , Rfl:  .  metoprolol tartrate (LOPRESSOR) 50 MG tablet, Take 50 mg by mouth 2 (two) times daily., Disp: , Rfl:  .  omeprazole (PRILOSEC) 40 MG capsule, Prilosec 40 mg capsule,delayed release   40 mg by oral route. (Patient not taking: Reported on 05/18/2020), Disp: , Rfl:  .  sitaGLIPtin (JANUVIA) 50 MG tablet, Take 1 tablet (50 mg total) by mouth daily., Disp: 30 tablet, Rfl: 0 .  torsemide (DEMADEX) 20 MG tablet, Take 20 mg by mouth daily. (Patient not taking: Reported on 05/18/2020), Disp: , Rfl:    Review of Systems  Constitutional: Negative for activity change, appetite change, chills, diaphoresis, fatigue, fever and unexpected weight change.  HENT: Negative for congestion, rhinorrhea, sinus pressure, sneezing, sore throat and trouble swallowing.   Eyes: Negative for photophobia and visual disturbance.  Respiratory: Negative for chest tightness, shortness of breath, wheezing and stridor.   Cardiovascular: Negative for chest pain, palpitations and leg swelling.  Gastrointestinal: Negative for abdominal distention, abdominal pain, anal bleeding, blood in stool, constipation, diarrhea, nausea and vomiting.  Genitourinary: Negative for difficulty urinating, dysuria, flank pain and hematuria.  Musculoskeletal: Positive for arthralgias and joint swelling. Negative for back pain and gait problem.  Skin: Positive for wound. Negative for color change, pallor and rash.  Neurological: Negative for dizziness, tremors, weakness and light-headedness.  Hematological: Negative for adenopathy. Does not bruise/bleed easily.  Psychiatric/Behavioral: Negative for behavioral problems, confusion, decreased concentration,  dysphoric mood and sleep disturbance.  Objective:   Physical Exam Constitutional:      General: He is not in acute distress.    Appearance: Normal appearance. He is well-developed. He is not ill-appearing or diaphoretic.  HENT:     Head: Normocephalic and atraumatic.     Right Ear: Hearing and external ear normal.     Left Ear: Hearing and external ear normal.     Nose: No nasal deformity or rhinorrhea.  Eyes:     General: No scleral icterus.    Conjunctiva/sclera: Conjunctivae normal.     Right eye: Right conjunctiva is not injected.     Left eye: Left conjunctiva is not injected.     Pupils: Pupils are equal, round, and reactive to light.  Neck:     Vascular: No JVD.  Cardiovascular:     Rate and Rhythm: Normal rate and regular rhythm.     Heart sounds: S1 normal and S2 normal.  Pulmonary:     Effort: Pulmonary effort is normal. No respiratory distress.     Breath sounds: No wheezing.  Abdominal:     General: There is no distension.     Palpations: Abdomen is soft.  Musculoskeletal:        General: Normal range of motion.     Right shoulder: Normal.     Left shoulder: Normal.     Cervical back: Normal range of motion and neck supple.     Right hip: Normal.     Left hip: Normal.     Right knee: Normal.     Left knee: Normal.  Lymphadenopathy:     Head:     Right side of head: No submandibular, preauricular or posterior auricular adenopathy.     Left side of head: No submandibular, preauricular or posterior auricular adenopathy.     Cervical: No cervical adenopathy.     Right cervical: No superficial or deep cervical adenopathy.    Left cervical: No superficial or deep cervical adenopathy.  Skin:    General: Skin is warm and dry.     Coloration: Skin is not pale.     Findings: No abrasion, bruising, ecchymosis, erythema, lesion or rash.     Nails: There is no clubbing.  Neurological:     General: No focal deficit present.     Mental Status: He is alert and  oriented to person, place, and time.     Sensory: No sensory deficit.     Coordination: Coordination normal.     Gait: Gait normal.  Psychiatric:        Attention and Perception: He is attentive.        Mood and Affect: Mood normal.        Speech: Speech normal.        Behavior: Behavior normal. Behavior is cooperative.        Thought Content: Thought content normal.        Judgment: Judgment normal.    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 I gave him his KCL and aldactone but asked him not to start them UNTIL either PCP or Dr [name] instruct him to do so  GERD: I had stopped his PPI initially  Given concern for CDI with levaquin. Will cc PCP if wants  To start again. If gerd not bad off of it would be good drug to get rid of for mx reasons.  I spent greater than 40 minutes with the patient including greater than 50% of time in face to face counsel of the patient re her hyperkalemia, AKI, his PJI limited antibiotics we have and risks of FQ in particular, cost of the [place] and in coordination of his care.

## 2020-05-19 NOTE — Telephone Encounter (Signed)
[facility] Patient Advocate Encounter  Prior Authorization for [name] has been approved.    PA# [id number] Effective dates: 05/19/20 through 04/10/21  Patients co-pay is $2008.05.  Patient have Medicare Part D, and there is no programs available for this drug right now.  If we can give the patient something else until he meets his deductible?   [facility] will continue to follow.  Ileene Patrick, [facility] Patient [facility] for Infectious Disease Phone: [phone] Fax:  [phone]

## 2020-05-19 NOTE — Telephone Encounter (Signed)
Faxed lab results to Dr. [name].

## 2020-05-19 NOTE — Telephone Encounter (Signed)
He decided to go with [place] despite the high cost due to his preference he be on the better safer medication  ON that note can we from Triage make sure he DOES NOT pick up the levaquin or use it as I sent that in before he changed his mind

## 2020-05-19 NOTE — Telephone Encounter (Signed)
Perfect

## 2020-05-20 MED ORDER — NUZYRA 150 MG PO TABS: 1.0000 | ORAL_TABLET | Freq: Every day | ORAL | 11 refills | Status: DC

## 2020-05-20 NOTE — Addendum Note (Signed)
Addended by: Landis Gandy on: 05/20/2020 04:44 PM   Modules accepted: Orders

## 2020-05-21 ENCOUNTER — Other Ambulatory Visit: Payer: Self-pay | Admitting: Infectious Disease

## 2020-05-21 ENCOUNTER — Telehealth: Payer: Self-pay

## 2020-05-21 DIAGNOSIS — M25461 Effusion, right knee: Secondary | ICD-10-CM | POA: Diagnosis not present

## 2020-05-21 DIAGNOSIS — Z96649 Presence of unspecified artificial hip joint: Secondary | ICD-10-CM

## 2020-05-21 DIAGNOSIS — A4159 Other Gram-negative sepsis: Secondary | ICD-10-CM

## 2020-05-21 DIAGNOSIS — Z96642 Presence of left artificial hip joint: Secondary | ICD-10-CM | POA: Diagnosis not present

## 2020-05-21 DIAGNOSIS — T8459XD Infection and inflammatory reaction due to other internal joint prosthesis, subsequent encounter: Secondary | ICD-10-CM

## 2020-05-21 MED ORDER — NUZYRA 150 MG PO TABS: 2.0000 | ORAL_TABLET | Freq: Every day | ORAL | 11 refills | Status: DC

## 2020-05-21 NOTE — Telephone Encounter (Signed)
Received call from patient. He states his daughter called the [place] drug company and the patient is wondering if he can get some samples of the medication to see if he tolerates it before spending the $2000 copay. RN spoke to Butch Penny, she states the company does not do samples. RN relayed this information to the patient. RN explained that the $2000 copay will probably be reduced once he meets his deductible.   RN advised patient that if he does develop side effects from the medication to let our office know so that we can make Dr. Tommy Medal aware. RN let patient know that [facility] [address]. Patient verbalized understanding and has no further questions.   Beryle Flock, RN

## 2020-05-21 NOTE — Telephone Encounter (Signed)
[facility]. [facility] to [facility] reiterate that patient should be getting a total of 300mg  daily, not 150mg . Pharmacist Nahla discontinued prescription for 150mg  and verified that patient will be getting the 300mg  daily dose of omadacycline.   Beryle Flock, RN

## 2020-05-21 NOTE — Telephone Encounter (Signed)
RN spoke with Dr. Tommy Medal, he has changed the patient's Omadacycline prescription to 300mg  daily. [facility] to make [facility] aware of the change, they have made a note on the account to disregard the two previous prescriptions for 150mg . RN verified ICD 10 codes on account. RN advised [facility] that the patient would like [facility] [address] spoke with patient to let him know that there was a dose change and that his [facility] [address]. Patient verbalized understanding and has no further questions. RN advised patient to let us know if he has any further issues.   Beryle Flock, RN

## 2020-05-21 NOTE — Telephone Encounter (Signed)
Received call from patient stating that his [place] prescription had an issue and the pharmacy was unable to send it. RN called [facility], they need provider to verify dose and duration. RN will speak with Dr. Tommy Medal.   Beryle Flock, RN

## 2020-05-25 ENCOUNTER — Telehealth: Payer: Self-pay

## 2020-05-25 NOTE — Telephone Encounter (Signed)
Received call from patient. He states he has still not received his [place]. RN called [facility], the patient has not contacted them to let them know where to ship the medication. RN spoke with patient and gave him phone number [phone]) to call to request that his [facility] [address]. Patient repeated phone number back to RN, verbalized understanding and has no further questions.   Beryle Flock, RN

## 2020-05-28 ENCOUNTER — Telehealth: Payer: Self-pay

## 2020-05-28 NOTE — Telephone Encounter (Signed)
Received call from patient, he states he still has not received his [place]. RN called [facility], they have on file that the medication was delivered to the patient's home yesterday 2/16 at 1:47pm. RN made patient aware that the medication should have been delivered. Advised patient to look all around his property. Per [facility], if he still cannot find it, he will need to call his insurance company and make them aware of the situation. His insurance company should then reach out to [facility] to resolve the issue. Advised patient to call us back if he cannot find the medication.   Beryle Flock, RN

## 2020-05-28 NOTE — Telephone Encounter (Signed)
Received call from patient, he was able to find the delivered [place].   Beryle Flock, RN

## 2020-06-08 DIAGNOSIS — N200 Calculus of kidney: Secondary | ICD-10-CM | POA: Diagnosis not present

## 2020-06-10 ENCOUNTER — Telehealth: Payer: Self-pay

## 2020-06-10 ENCOUNTER — Other Ambulatory Visit: Payer: Self-pay | Admitting: Family Medicine

## 2020-06-10 NOTE — Telephone Encounter (Signed)
Received call today from [facility] regarding copay assistance program for Nuzyra. Per pharmacy copay for medication was $2,000. Copay assistance can cover quantity of 30 for one year.  Will fax application to office to review.

## 2020-06-11 ENCOUNTER — Telehealth: Payer: Self-pay

## 2020-06-11 ENCOUNTER — Other Ambulatory Visit: Payer: Self-pay | Admitting: Family Medicine

## 2020-06-11 NOTE — Telephone Encounter (Signed)
Patient called regarding his ARV which was switched due to his elevated potassium.Marland Kitchen  His medication was switched to [place]  his co pay is $2,000.00 .  Patient states the pharmacy was going to send information that may reduce his payment. So far we have not received any information.  The patient will be running out of his medicine soon and would like to be ensured he will have medication.

## 2020-06-12 ENCOUNTER — Other Ambulatory Visit: Payer: Self-pay | Admitting: Family Medicine

## 2020-06-12 NOTE — Telephone Encounter (Signed)
That was his deductible it shouldn't cost that much of anything going forward Butch Penny can you confirm

## 2020-06-15 ENCOUNTER — Other Ambulatory Visit: Payer: Self-pay

## 2020-06-15 ENCOUNTER — Ambulatory Visit (INDEPENDENT_AMBULATORY_CARE_PROVIDER_SITE_OTHER): Payer: HMO | Admitting: Family Medicine

## 2020-06-15 ENCOUNTER — Encounter: Payer: Self-pay | Admitting: Family Medicine

## 2020-06-15 DIAGNOSIS — I10 Essential (primary) hypertension: Secondary | ICD-10-CM | POA: Diagnosis not present

## 2020-06-15 DIAGNOSIS — I25118 Atherosclerotic heart disease of native coronary artery with other forms of angina pectoris: Secondary | ICD-10-CM | POA: Diagnosis not present

## 2020-06-15 DIAGNOSIS — M009 Pyogenic arthritis, unspecified: Secondary | ICD-10-CM | POA: Diagnosis not present

## 2020-06-15 DIAGNOSIS — E1165 Type 2 diabetes mellitus with hyperglycemia: Secondary | ICD-10-CM | POA: Diagnosis not present

## 2020-06-15 DIAGNOSIS — N200 Calculus of kidney: Secondary | ICD-10-CM | POA: Diagnosis not present

## 2020-06-15 DIAGNOSIS — L02419 Cutaneous abscess of limb, unspecified: Secondary | ICD-10-CM | POA: Diagnosis not present

## 2020-06-15 DIAGNOSIS — E119 Type 2 diabetes mellitus without complications: Secondary | ICD-10-CM | POA: Diagnosis not present

## 2020-06-15 DIAGNOSIS — I2510 Atherosclerotic heart disease of native coronary artery without angina pectoris: Secondary | ICD-10-CM | POA: Diagnosis not present

## 2020-06-15 DIAGNOSIS — M10041 Idiopathic gout, right hand: Secondary | ICD-10-CM | POA: Diagnosis not present

## 2020-06-15 DIAGNOSIS — E875 Hyperkalemia: Secondary | ICD-10-CM

## 2020-06-15 MED ORDER — METOPROLOL TARTRATE 50 MG PO TABS: 50.0000 mg | ORAL_TABLET | Freq: Two times a day (BID) | ORAL | 1 refills | Status: DC

## 2020-06-15 MED ORDER — GLIMEPIRIDE 4 MG PO TABS: ORAL_TABLET | ORAL | 1 refills | Status: DC

## 2020-06-15 MED ORDER — ATORVASTATIN CALCIUM 40 MG PO TABS: ORAL_TABLET | ORAL | 1 refills | Status: DC

## 2020-06-15 NOTE — Patient Instructions (Signed)
Check blood sugars daily.  Let me know readings after 7-10 days.  We may need to add some insulin if these numbers are elevated.   You can use the colchicine 1/2 tab daily as needed for gout flare.    See me again in about 2 months.

## 2020-06-16 ENCOUNTER — Encounter: Payer: Self-pay | Admitting: Family Medicine

## 2020-06-16 NOTE — Assessment & Plan Note (Signed)
This is improved significantly.  I think he can discontinue daily colchicine use and use as needed.  We will have him use half a tab as needed due to him restarting amiodarone.

## 2020-06-16 NOTE — Assessment & Plan Note (Signed)
Recent potassium levels have normalized.

## 2020-06-16 NOTE — Assessment & Plan Note (Signed)
He denies any anginal symptoms at this time.  Continue to control risk factors including blood pressure, diabetes and lipids.

## 2020-06-16 NOTE — Assessment & Plan Note (Signed)
Unfortunately he had to discontinue [place]. He would be interested in starting fluoroquinolone for continued treatment.  He will contact Dr. Lucianne Lei dam's office

## 2020-06-16 NOTE — Progress Notes (Signed)
[name] - 75 y.o. male MRN [id number]  Date of birth: 11/29/1945  Subjective Chief Complaint  Patient presents with  . Diabetes    HPI [name] is a 75 y.o. male here today for follow-up of hypertension, type 2 diabetes, gout.  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  He is seeing infectious disease as well as orthopedics.  His infectious disease doctor started him on [place] however he reports that he cannot continue this due to cost.  He would be interested in changing to fluoroquinolone as discussed at his previous visit with ID.  He is aware of side effects related to these medications.  Blood pressure is elevated today.  His cardiologist has been managing medications for treatment of his blood pressure.  He has follow-up with him this afternoon.  He denies chest pain, shortness of breath, palpitations, headache or vision changes.  Diabetes is currently managed with glimepiride and Januvia.  He states he is doing well with medications as far as tolerance.  He is not monitoring blood sugars at home.  He does have freestyle [employer] however is not using this in several weeks.  He does have an appointment with endocrinology later this month.  His gout has improved with colchicine.  Still has some stiffness in one of his fingers however swelling in his ankle and hand have improved.  His cardiologist did restart him on amiodarone.  He denies myalgias or diarrhea.  ROS:  A comprehensive ROS was completed and negative except as noted per HPI  Allergies  Allergen Reactions  . Shellfish Allergy Rash    Past Medical History:  Diagnosis Date  . Arthritis   . Asthma    no inhaler  . Coronary artery disease    cardiologist-  dr spruill; last visit 3 mos ago per pt  . Enterobacter sepsis ([place]) 04/13/2020  . GERD (gastroesophageal reflux disease)   . History of MI (myocardial infarction)    1985  . Hydronephrosis, left   . Hypertension   . Myocardial infarction  ([place])   . Nephrolithiasis    left  . 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 Presence of tooth-root and mandibular implants    lower dental implants  . Prostate cancer ([place])   . Prosthetic hip infection ([place]) 04/13/2020  . Shortness of breath    WITH EXERTION  . Thigh abscess 04/13/2020  . Type 2 diabetes mellitus ([place])     Past Surgical History:  Procedure Laterality Date  . BUNIONECTOMY    . CYSTOSCOPY W/ RETROGRADES Left 03/14/2014   Procedure: CYSTOSCOPY WITH LEFT RETROGRADE PYELOGRAM, Left Ureteroscopy, [place] ureteral Stent No string;  Surgeon: [name], MD;  Location: [facility];  Service: Urology;  Laterality: Left;  . CYSTOSCOPY W/ URETERAL STENT PLACEMENT Bilateral 01/15/2020   Procedure: CYSTOSCOPY WITH RETROGRADE PYELOGRAM/URETERAL STENT PLACEMENT;  Surgeon: Alexis Frock, MD;  Location: [facility];  Service: Urology;  Laterality: Bilateral;  . CYSTOSCOPY/URETEROSCOPY/HOLMIUM LASER/STENT PLACEMENT Bilateral 03/17/2020   Procedure: CYSTOSCOPY/URETEROSCOPY/HOLMIUM LASER/STENT LEFT PLACEMENT;  Surgeon: Festus Aloe, MD;  Location: [facility];  Service: Urology;  Laterality: Bilateral;  . HERNIA REPAIR    . INCISION AND DRAINAGE Left 03/09/2020   Procedure: INCISION AND DRAINAGE LEFT HIP WITH LINER EXCHANGE;  Surgeon: Gaynelle Arabian, MD;  Location: [facility];  Service: Orthopedics;  Laterality: Left;  . IR FLUORO GUIDE CV LINE RIGHT  03/10/2020  . IR RADIOLOGIST EVAL & MGMT  04/01/2020  .  IR RADIOLOGIST EVAL & MGMT  04/15/2020  . IR RADIOLOGIST EVAL & MGMT  04/30/2020  . IR REMOVAL TUN CV CATH W/O FL  04/22/2020  . IR US GUIDE BX ASP/DRAIN  03/17/2020  . IR US GUIDE VASC ACCESS RIGHT  03/10/2020  . PROSTATE BIOPSY    . TOTAL HIP ARTHROPLASTY Left 03-20-2009  . TOTAL HIP ARTHROPLASTY  04/09/2012   Procedure: TOTAL HIP ARTHROPLASTY;  Surgeon: [name], MD;  Location: [facility];  Service: Orthopedics;  Laterality: Right;  . TOTAL KNEE ARTHROPLASTY  Left 05-16-2008    Social History   Socioeconomic History  . Marital status: Widowed    Spouse name: Not on file  . Number of children: Not on file  . Years of education: Not on file  . Highest education level: Not on file  Occupational History  . Not on file  Tobacco Use  . Smoking status: Former Smoker    Packs/day: 1.00    Years: 25.00    Pack years: 25.00    Types: Cigarettes    Quit date: 04/11/1984    Years since quitting: 36.2  . Smokeless tobacco: Former Systems developer    Quit date: 04/02/1985  Vaping Use  . Vaping Use: Never used  Substance and Sexual Activity  . Alcohol use: Not Currently    Comment: RARE  . Drug use: No  . Sexual activity: Not Currently  Other Topics Concern  . Not on file  Social History Narrative  . Not on file   Social Determinants of Health   Financial Resource Strain: Not on file  Food Insecurity: Not on file  Transportation Needs: Not on file  Physical Activity: Not on file  Stress: Not on file  Social Connections: Not on file    Family History  Problem Relation Age of Onset  . Cancer Mother        breast  . Multiple sclerosis Daughter   . Cancer Father        prostate  . Cancer Maternal Uncle        bone    Health Maintenance  Topic Date Due  . URINE MICROALBUMIN  10/23/2019  . FOOT EXAM  01/22/2020  . COVID-19 Vaccine (3 - Moderna risk 4-dose series) 04/15/2020  . OPHTHALMOLOGY EXAM  09/22/2020  . HEMOGLOBIN A1C  11/15/2020  . TETANUS/TDAP  04/10/2025  . COLONOSCOPY (Pts 45-80yr Insurance coverage will need to be confirmed)  11/15/2028  . INFLUENZA VACCINE  Completed  . Hepatitis C Screening  Completed  . PNA vac Low Risk Adult  Completed  . HPV VACCINES  Aged Out     ----------------------------------------------------------------------------------------------------------------------------------------------------------------------------------------------------------------- Physical Exam BP (!) 178/76 (BP Location:  Left Arm, Patient Position: Sitting, Cuff Size: Large)   Pulse 74   Wt 226 lb (102.5 kg)   SpO2 94%   BMI 36.48 kg/m   Physical Exam Constitutional:      Appearance: Normal appearance.  Eyes:     General: No scleral icterus. Cardiovascular:     Rate and Rhythm: Normal rate and regular rhythm.  Pulmonary:     Effort: Pulmonary effort is normal.     Breath sounds: Normal breath sounds.  Musculoskeletal:     Cervical back: Neck supple.     Comments: Swelling of hand and ankle improved.    Neurological:     General: No focal deficit present.     Mental Status: He is alert.  Psychiatric:        Mood and Affect:  Mood normal.        Behavior: Behavior normal.     ------------------------------------------------------------------------------------------------------------------------------------------------------------------------------------------------------------------- Assessment and Plan  Gout of right hand This is improved significantly.  I think he can discontinue daily colchicine use and use as needed.  We will have him use half a tab as needed due to him restarting amiodarone.  CAD (coronary artery disease) He denies any anginal symptoms at this time.  Continue to control risk factors including blood pressure, diabetes and lipids.  Essential hypertension Blood pressure is elevated today.  He states he will discuss this with his cardiologist as he is currently managing this for him.  Uncontrolled type 2 diabetes mellitus with hyperglycemia, without long-term current use of insulin ([place]) Diabetes remains fairly uncontrolled.  Previous steroids as well as ongoing infection likely contributing.  We will work on dietary changes per recommendations.  He does have a visit coming up with endocrinology.  Blood sugar not improving he may need to consider insulin.  Thigh abscess Unfortunately he had to discontinue Nuzyra. He would be interested in starting fluoroquinolone for  continued treatment.  He will contact Dr. Lucianne Lei dam's office  Hyperkalemia Recent potassium levels have normalized.   Meds ordered this encounter  Medications  . atorvastatin (LIPITOR) 40 MG tablet    Sig: TAKE 1 TABLET BY MOUTH EVERY DAY FOR HYPERLIPIDEMIA    Dispense:  90 tablet    Refill:  1  . glimepiride (AMARYL) 4 MG tablet    Sig: TAKE 1 TABLET BY MOUTH EVERY DAY FOR DIABETES    Dispense:  90 tablet    Refill:  1  . metoprolol tartrate (LOPRESSOR) 50 MG tablet    Sig: Take 1 tablet (50 mg total) by mouth 2 (two) times daily.    Dispense:  90 tablet    Refill:  1    Return in about 2 months (around 08/15/2020) for DM/Gout.    This visit occurred during the SARS-CoV-2 public health emergency.  Safety protocols were in place, including screening questions prior to the visit, additional usage of staff PPE, and extensive cleaning of exam room while observing appropriate contact time as indicated for disinfecting solutions.

## 2020-06-16 NOTE — Assessment & Plan Note (Signed)
Blood pressure is elevated today.  He states he will discuss this with his cardiologist as he is currently managing this for him.

## 2020-06-16 NOTE — Assessment & Plan Note (Signed)
Diabetes remains fairly uncontrolled.  Previous steroids as well as ongoing infection likely contributing.  We will work on dietary changes per recommendations.  He does have a visit coming up with endocrinology.  Blood sugar not improving he may need to consider insulin.

## 2020-06-18 ENCOUNTER — Telehealth: Payer: Self-pay

## 2020-06-18 NOTE — Telephone Encounter (Signed)
Patient still has a co-pay of 683.51. Do you want to change the medication Dr. Tommy Medal?

## 2020-06-18 NOTE — Telephone Encounter (Signed)
Why would we do that now after he has already paid so much. He told me he was agreeable to paying the $2k. We could switch him to levaquin and risk C difficile colitis and then go back to omadacycyline but I really really do not want him to get that. My understanding is that AFTER this last cost it should be free correct [name] since he will have met his deductible?

## 2020-06-18 NOTE — Telephone Encounter (Signed)
I spoke with patient and he would rather switch to a cheaper antibiotic. I have spoke to [place] and canceled the [facility] for the patient.

## 2020-06-18 NOTE — Telephone Encounter (Signed)
error 

## 2020-06-18 NOTE — Telephone Encounter (Signed)
Patient is calling to see if there there is an update on his medication Nuzyra. Do we need to change his medication or should he be ok to pick it up without a high copay

## 2020-06-18 NOTE — Telephone Encounter (Signed)
He will have a Co-pay of $683.51 , he have not met his deductible yet.

## 2020-06-19 ENCOUNTER — Telehealth: Payer: Self-pay

## 2020-06-19 ENCOUNTER — Telehealth: Payer: Self-pay | Admitting: Infectious Disease

## 2020-06-19 ENCOUNTER — Other Ambulatory Visit: Payer: Self-pay | Admitting: Infectious Disease

## 2020-06-19 MED ORDER — NUZYRA 150 MG PO TABS: ORAL_TABLET | ORAL | 11 refills | Status: DC

## 2020-06-19 MED ORDER — LEVOFLOXACIN 750 MG PO TABS: 750.0000 mg | ORAL_TABLET | ORAL | 11 refills | Status: DC

## 2020-06-19 NOTE — Telephone Encounter (Signed)
[name] was d/c from patient's list by his pcp at his last visit with them and he needs a new RX to got to [facility].  [facility] sent

## 2020-06-19 NOTE — Telephone Encounter (Signed)
I called patient to advise him that Dr. Tommy Medal will be changing his antibiotic to Levaquin.  Patient also advised of the risks while taking the medication. After advising the patient of the medication risk he stated he rather not take the Levaquin and would rather pay the 689.00 for the Nuzyra instead. Patient will need a new RX for this and will need it sent to [facility].

## 2020-06-19 NOTE — Telephone Encounter (Signed)
I put the prescription in.  This time though I want him to take 3 tablets by mouth for the first 2 days and then go to 2 tablets from by mouth daily. (We are doing a loading dose first 2 days) also cannot eat for 4 hours before and 2 hours after each dose of medication

## 2020-06-19 NOTE — Telephone Encounter (Signed)
-----   Message from Truman Hayward, MD sent at 06/19/2020  8:32 AM EST ----- Regarding: RE: Levaquin I am changing him to levaquin 750mg  to be taken every other day. He then needs to see PCP re his glimeride and watch his blood sugars as this antibiotic risks lowering his blood sugar. He also needs an EKGti check an EKG due to risk of QT prolongation ----- Message ----- From: Roney Jaffe, CPhT Sent: 06/19/2020   8:27 AM EST To: Truman Hayward, MD, # Subject: Levaquin                                       Good morning,  Levaquin is a [facility] drug and it will be $6.95 for #60/ 30 days (500mg )     Thank you,  Ileene Patrick, [place] Patient [facility] Phone: [phone] Fax: [phone]

## 2020-06-19 NOTE — Telephone Encounter (Signed)
I am going to see what Dorothea Ogle thinks?

## 2020-06-19 NOTE — Telephone Encounter (Signed)
Ok we'll I hate him paying so much. There should've been refills on nuzyra

## 2020-06-19 NOTE — Telephone Encounter (Signed)
I mentioned the QT prolongation in my note  There is nothing else I can do as far as oral antitibiotic  Would try levaquin and see his pcp or cardiologist next week for EKG

## 2020-06-19 NOTE — Telephone Encounter (Signed)
Patient has already been taking the [place]. Does he still need to do the loading dose?

## 2020-06-26 ENCOUNTER — Other Ambulatory Visit: Payer: Self-pay

## 2020-06-26 ENCOUNTER — Ambulatory Visit (INDEPENDENT_AMBULATORY_CARE_PROVIDER_SITE_OTHER): Payer: HMO | Admitting: Podiatry

## 2020-06-26 ENCOUNTER — Ambulatory Visit (INDEPENDENT_AMBULATORY_CARE_PROVIDER_SITE_OTHER): Payer: HMO | Admitting: Infectious Disease

## 2020-06-26 ENCOUNTER — Ambulatory Visit (INDEPENDENT_AMBULATORY_CARE_PROVIDER_SITE_OTHER): Payer: HMO

## 2020-06-26 ENCOUNTER — Encounter: Payer: Self-pay | Admitting: Infectious Disease

## 2020-06-26 VITALS — BP 176/86 | HR 63 | Temp 98.4°F | Wt 227.0 lb

## 2020-06-26 DIAGNOSIS — Z96649 Presence of unspecified artificial hip joint: Secondary | ICD-10-CM

## 2020-06-26 DIAGNOSIS — M79674 Pain in right toe(s): Secondary | ICD-10-CM

## 2020-06-26 DIAGNOSIS — L02419 Cutaneous abscess of limb, unspecified: Secondary | ICD-10-CM | POA: Diagnosis not present

## 2020-06-26 DIAGNOSIS — R7881 Bacteremia: Secondary | ICD-10-CM | POA: Diagnosis not present

## 2020-06-26 DIAGNOSIS — T8459XD Infection and inflammatory reaction due to other internal joint prosthesis, subsequent encounter: Secondary | ICD-10-CM

## 2020-06-26 DIAGNOSIS — N179 Acute kidney failure, unspecified: Secondary | ICD-10-CM | POA: Diagnosis not present

## 2020-06-26 DIAGNOSIS — M779 Enthesopathy, unspecified: Secondary | ICD-10-CM

## 2020-06-26 DIAGNOSIS — M21621 Bunionette of right foot: Secondary | ICD-10-CM | POA: Diagnosis not present

## 2020-06-26 DIAGNOSIS — M10041 Idiopathic gout, right hand: Secondary | ICD-10-CM | POA: Diagnosis not present

## 2020-06-26 DIAGNOSIS — M79675 Pain in left toe(s): Secondary | ICD-10-CM

## 2020-06-26 DIAGNOSIS — A4159 Other Gram-negative sepsis: Secondary | ICD-10-CM | POA: Diagnosis not present

## 2020-06-26 DIAGNOSIS — N183 Chronic kidney disease, stage 3 unspecified: Secondary | ICD-10-CM

## 2020-06-26 DIAGNOSIS — B351 Tinea unguium: Secondary | ICD-10-CM | POA: Diagnosis not present

## 2020-06-26 DIAGNOSIS — M1A0710 Idiopathic chronic gout, right ankle and foot, without tophus (tophi): Secondary | ICD-10-CM

## 2020-06-26 DIAGNOSIS — M21619 Bunion of unspecified foot: Secondary | ICD-10-CM

## 2020-06-26 MED ORDER — TRIAMCINOLONE ACETONIDE 10 MG/ML IJ SUSP
10.0000 mg | Freq: Once | INTRAMUSCULAR | Status: AC
  Administered 2020-06-26: 10 mg

## 2020-06-26 NOTE — Patient Instructions (Signed)
Gout  Gout is painful swelling of your joints. Gout is a type of arthritis. It is caused by having too much uric acid in your body. Uric acid is a chemical that is made when your body breaks down substances called purines. If your body has too much uric acid, sharp crystals can form and build up in your joints. This causes pain and swelling. Gout attacks can happen quickly and be very painful (acute gout). Over time, the attacks can affect more joints and happen more often (chronic gout). What are the causes?  Too much uric acid in your blood. This can happen because: ? Your kidneys do not remove enough uric acid from your blood. ? Your body makes too much uric acid. ? You eat too many foods that are high in purines. These foods include organ meats, some seafood, and beer.  Trauma or stress. What increases the risk?  Having a family history of gout.  Being male and middle-aged.  Being male and having gone through menopause.  Being very overweight (obese).  Drinking alcohol, especially beer.  Not having enough water in the body (being dehydrated).  Losing weight too quickly.  Having an organ transplant.  Having lead poisoning.  Taking certain medicines.  Having kidney disease.  Having a skin condition called psoriasis. What are the signs or symptoms? An attack of acute gout usually happens in just one joint. The most common place is the big toe. Attacks often start at night. Other joints that may be affected include joints of the feet, ankle, knee, fingers, wrist, or elbow. Symptoms of an attack may include:  Very bad pain.  Warmth.  Swelling.  Stiffness.  Shiny, red, or purple skin.  Tenderness. The affected joint may be very painful to touch.  Chills and fever. Chronic gout may cause symptoms more often. More joints may be involved. You may also have white or yellow lumps (tophi) on your hands or feet or in other areas near your joints.   How is this  treated?  Treatment for this condition has two phases: treating an acute attack and preventing future attacks.  Acute gout treatment may include: ? NSAIDs. ? Steroids. These are taken by mouth or injected into a joint. ? Colchicine. This medicine relieves pain and swelling. It can be given by mouth or through an IV tube.  Preventive treatment may include: ? Taking small doses of NSAIDs or colchicine daily. ? Using a medicine that reduces uric acid levels in your blood. ? Making changes to your diet. You may need to see a food expert (dietitian) about what to eat and drink to prevent gout. Follow these instructions at home: During a gout attack  If told, put ice on the painful area: ? Put ice in a plastic bag. ? Place a towel between your skin and the bag. ? Leave the ice on for 20 minutes, 2-3 times a day.  Raise (elevate) the painful joint above the level of your heart as often as you can.  Rest the joint as much as possible. If the joint is in your leg, you may be given crutches.  Follow instructions from your doctor about what you cannot eat or drink.   Avoiding future gout attacks  Eat a low-purine diet. Avoid foods and drinks such as: ? Liver. ? Kidney. ? Anchovies. ? Asparagus. ? Herring. ? Mushrooms. ? Mussels. ? Beer.  Stay at a healthy weight. If you want to lose weight, talk with your doctor. Do   not lose weight too fast.  Start or continue an exercise plan as told by your doctor. Eating and drinking  Drink enough fluids to keep your pee (urine) pale yellow.  If you drink alcohol: ? Limit how much you use to:  0-1 drink a day for women.  0-2 drinks a day for men. ? Be aware of how much alcohol is in your drink. In the [place] one drink equals one 12 oz bottle of beer (355 mL), one 5 oz glass of wine (148 mL), or one 1 oz glass of hard liquor (44 mL). General instructions  Take over-the-counter and prescription medicines only as told by your doctor.  Do  not drive or use heavy machinery while taking prescription pain medicine.  Return to your normal activities as told by your doctor. Ask your doctor what activities are safe for you.  Keep all follow-up visits as told by your doctor. This is important. Contact a doctor if:  You have another gout attack.  You still have symptoms of a gout attack after 10 days of treatment.  You have problems (side effects) because of your medicines.  You have chills or a fever.  You have burning pain when you pee (urinate).  You have pain in your lower back or belly. Get help right away if:  You have very bad pain.  Your pain cannot be controlled.  You cannot pee. Summary  Gout is painful swelling of the joints.  The most common site of pain is the big toe, but it can affect other joints.  Medicines and avoiding some foods can help to prevent and treat gout attacks. This information is not intended to replace advice given to you by your health care provider. Make sure you discuss any questions you have with your health care provider. Document Revised: 10/18/2017 Document Reviewed: 10/18/2017 Elsevier Patient Education  2021 [employer]  

## 2020-06-26 NOTE — Progress Notes (Signed)
Subjective:  Chief complaint followup for PJI    Patient ID: [name], male    DOB: 1945/08/17, 75 y.o.   MRN: [id number]  HPI   75 year old Black man with PMHx of CAD, DM2, HTN, CKD, OA, s/p left hip arthoplasty (2001), prostate cancer, PAF, left ureteral stone s/p stenting (01/2020) who was sent from ortho clinic on 11/23 by Dr Wynelle Link. He is growing Enterobacter from blood, urine and hip aspirate. He is sp surgery with Irrigation and debridement, left hip, with bearing surface exchange. Enterobacter grew on cultures.  We had him scheduled to complete antibiotics on 10 January.  In the interim he developed a fluid collection in his thigh that was drained and seemed rather bloody but which also grew Enterobacter from culture.  Since I last saw him he finished his cefepime and we started bactrim DS BID.  Supposed to have had a metabolic panel shortly after starting that regimen but has not had that done yet.  He has been seen by interventional radiology and his drain output became minimal and this was now been removed on his thigh.  He has noticed in the last few days an area that is a "bump that is long the proximal aspect of his incision.  There is an area that is palpable there yesterday see my picture.  It is not clearly fluctuant but does need attention and he does need to see Dr. [name].  Last week we checked his metabolic panel stat and fortunately his potassium came back at 6.5 okay and creatinine 2.13  I called him last night and he came in and had repeat BMP  I asked him to also bring all of his medications with him.  When he came to the clinic today I found that not only was he on the spironolactone that is listed on his med list but which he did not recognize me I talked him over the phone but he was also on potassium chloride that Dr. [name] had prescribed to him he he says in a visit right after I prescribed him the Bactrim.  We obtained an EKG which shows normal  sinus rhythm with some first-degree AV block and in my view peaked T waves in the anteroseptal leads but not in the inferior or lateral leads.  There are no significant T wave inversions.  Repeat BMP showed similar K and Cr elevation  We had him go to ER Sharyn Lull brought him there in wheelchair) and he had treatment for hyperkalemia there and we did repeat labs on Friday and he did again yesterday with PCP.  He is also being treated for gout in hand and foot.  We obtained [place] PA though due to his deductible not having yet been met he would have to pay roughly $2k upfront.  He ended up doing that but then turned out that he need to pay $640 a month even though he had met that amount.  He has been tolerating the medications well without problems his hip pain is gone down significantly.  He is seen Dr. [name] in follow-up  He did have a gout flare recently involving his right wrist his right knee and his right foot and had a steroid injection by Dr. Paulla Dolly today in the right foot.  Otherwise he is doing well and without complaints       Past Medical History:  Diagnosis Date  . Arthritis   . Asthma    no inhaler  .  Coronary artery disease    cardiologist-  dr spruill; last visit 3 mos ago per pt  . Enterobacter sepsis ([place]) 04/13/2020  . GERD (gastroesophageal reflux disease)   . History of MI (myocardial infarction)    1985  . Hydronephrosis, left   . Hypertension   . Myocardial infarction ([place])   . Nephrolithiasis    left  . 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 Presence of tooth-root and mandibular implants    lower dental implants  . Prostate cancer ([place])   . 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 Shortness of breath    WITH EXERTION  . Thigh abscess 04/13/2020  . Type 2 diabetes mellitus ([place])     Past Surgical History:  Procedure Laterality Date  . BUNIONECTOMY    . CYSTOSCOPY W/ RETROGRADES Left 03/14/2014   Procedure: CYSTOSCOPY WITH  LEFT RETROGRADE PYELOGRAM, Left Ureteroscopy, Lweft ureteral Stent No string;  Surgeon: [name], MD;  Location: [facility];  Service: Urology;  Laterality: Left;  . CYSTOSCOPY W/ URETERAL STENT PLACEMENT Bilateral 01/15/2020   Procedure: CYSTOSCOPY WITH RETROGRADE PYELOGRAM/URETERAL STENT PLACEMENT;  Surgeon: Alexis Frock, MD;  Location: [facility];  Service: Urology;  Laterality: Bilateral;  . CYSTOSCOPY/URETEROSCOPY/HOLMIUM LASER/STENT PLACEMENT Bilateral 03/17/2020   Procedure: CYSTOSCOPY/URETEROSCOPY/HOLMIUM LASER/STENT LEFT PLACEMENT;  Surgeon: Festus Aloe, MD;  Location: [facility];  Service: Urology;  Laterality: Bilateral;  . HERNIA REPAIR    . INCISION AND DRAINAGE Left 03/09/2020   Procedure: INCISION AND DRAINAGE LEFT HIP WITH LINER EXCHANGE;  Surgeon: Gaynelle Arabian, MD;  Location: [facility];  Service: Orthopedics;  Laterality: Left;  . IR FLUORO GUIDE CV LINE RIGHT  03/10/2020  . IR RADIOLOGIST EVAL & MGMT  04/01/2020  . IR RADIOLOGIST EVAL & MGMT  04/15/2020  . IR RADIOLOGIST EVAL & MGMT  04/30/2020  . IR REMOVAL TUN CV CATH W/O FL  04/22/2020  . IR US GUIDE BX ASP/DRAIN  03/17/2020  . IR US GUIDE VASC ACCESS RIGHT  03/10/2020  . PROSTATE BIOPSY    . TOTAL HIP ARTHROPLASTY Left 03-20-2009  . TOTAL HIP ARTHROPLASTY  04/09/2012   Procedure: TOTAL HIP ARTHROPLASTY;  Surgeon: [name], MD;  Location: [facility];  Service: Orthopedics;  Laterality: Right;  . TOTAL KNEE ARTHROPLASTY Left 05-16-2008    Family History  Problem Relation Age of Onset  . Cancer Mother        breast  . Multiple sclerosis Daughter   . Cancer Father        prostate  . Cancer Maternal Uncle        bone      Social History   Socioeconomic History  . Marital status: Widowed    Spouse name: Not on file  . Number of children: Not on file  . Years of education: Not on file  . Highest education level: Not on file  Occupational History  . Not on file  Tobacco Use  . Smoking status:  Former Smoker    Packs/day: 1.00    Years: 25.00    Pack years: 25.00    Types: Cigarettes    Quit date: 04/11/1984    Years since quitting: 36.2  . Smokeless tobacco: Former Systems developer    Quit date: 04/02/1985  Vaping Use  . Vaping Use: Never used  Substance and Sexual Activity  . Alcohol use: Not Currently    Comment: RARE  . Drug use: No  . Sexual activity: Not Currently  Other Topics Concern  . Not on file  Social History Narrative  . Not on file   Social Determinants of Health   Financial Resource Strain: Not on file  Food Insecurity: Not on file  Transportation Needs: Not on file  Physical Activity: Not on file  Stress: Not on file  Social Connections: Not on file    Allergies  Allergen Reactions  . Shellfish Allergy Rash     Current Outpatient Medications:  .  amiodarone (PACERONE) 200 MG tablet, Take 200 mg by mouth daily., Disp: , Rfl:  .  Aspirin 81 MG CAPS, Take 81 mg by mouth daily., Disp: , Rfl:  .  atorvastatin (LIPITOR) 40 MG tablet, TAKE 1 TABLET BY MOUTH EVERY DAY FOR HYPERLIPIDEMIA, Disp: 90 tablet, Rfl: 1 .  colchicine 0.6 MG tablet, TAKE 1 TABLET BY MOUTH EVERY DAY, Disp: 30 tablet, Rfl: 1 .  Continuous Blood Gluc Receiver (FREESTYLE LIBRE 36 DAY READER) DEVI, See admin instructions., Disp: , Rfl:  .  Continuous Blood Gluc Sensor (FREESTYLE LIBRE 14 DAY SENSOR) MISC, 1 Device by Does not apply route every 14 (fourteen) days., Disp: 6 each, Rfl: 1 .  cycloSPORINE (RESTASIS) 0.05 % ophthalmic emulsion, Place 1 drop into both eyes 2 (two) times daily. , Disp: , Rfl:  .  docusate sodium (COLACE) 100 MG capsule, Take 100 mg by mouth daily as needed for mild constipation., Disp: , Rfl:  .  ferrous sulfate 325 (65 FE) MG tablet, Take 325 mg by mouth daily with breakfast., Disp: , Rfl:  .  glimepiride (AMARYL) 4 MG tablet, TAKE 1 TABLET BY MOUTH EVERY DAY FOR DIABETES, Disp: 90 tablet, Rfl: 1 .  metoprolol tartrate (LOPRESSOR) 50 MG tablet, Take 1 tablet (50 mg  total) by mouth 2 (two) times daily., Disp: 90 tablet, Rfl: 1 .  Omadacycline Tosylate (NUZYRA) 150 MG TABS, Take 3 tablets by mouth for 2 days and then 2 tablets by mouth daily, no food for 4 hours before and 2hours after each dose, Disp: 30 tablet, Rfl: 11 .  omeprazole (PRILOSEC) 40 MG capsule, TAKE 1 CAPSULE BY MOUTH TWICE A DAY FOR GERD, Disp: 90 capsule, Rfl: 0 .  sitaGLIPtin (JANUVIA) 50 MG tablet, Take 1 tablet (50 mg total) by mouth daily., Disp: 30 tablet, Rfl: 0 .  allopurinol (ZYLOPRIM) 100 MG tablet, Take 0.5 tablets (50 mg total) by mouth daily. (Patient not taking: No sig reported), Disp: 45 tablet, Rfl: 1   Review of Systems  Constitutional: Negative for activity change, appetite change, chills, diaphoresis, fatigue, fever and unexpected weight change.  HENT: Negative for congestion, rhinorrhea, sinus pressure, sneezing, sore throat and trouble swallowing.   Eyes: Negative for photophobia and visual disturbance.  Respiratory: Negative for chest tightness, shortness of breath, wheezing and stridor.   Cardiovascular: Negative for chest pain, palpitations and leg swelling.  Gastrointestinal: Negative for abdominal distention, abdominal pain, anal bleeding, blood in stool, constipation, diarrhea, nausea and vomiting.  Genitourinary: Negative for difficulty urinating, dysuria, flank pain and hematuria.  Musculoskeletal: Positive for arthralgias, joint swelling and myalgias. Negative for back pain and gait problem.  Skin: Positive for wound. Negative for color change, pallor and rash.  Neurological: Negative for dizziness, tremors, weakness and light-headedness.  Hematological: Negative for adenopathy. Does not bruise/bleed easily.  Psychiatric/Behavioral: Negative for behavioral problems, confusion, decreased concentration, dysphoric mood and sleep disturbance.       Objective:   Physical Exam Constitutional:      General: He is not in  acute distress.    Appearance: Normal  appearance. He is well-developed. He is not ill-appearing or diaphoretic.  HENT:     Head: Normocephalic and atraumatic.     Right Ear: Hearing and external ear normal.     Left Ear: Hearing and external ear normal.     Nose: No nasal deformity or rhinorrhea.  Eyes:     General: No scleral icterus.    Conjunctiva/sclera: Conjunctivae normal.     Right eye: Right conjunctiva is not injected.     Left eye: Left conjunctiva is not injected.     Pupils: Pupils are equal, round, and reactive to light.  Neck:     Vascular: No JVD.  Cardiovascular:     Rate and Rhythm: Normal rate and regular rhythm.     Heart sounds: S1 normal and S2 normal.  Pulmonary:     Effort: Pulmonary effort is normal. No respiratory distress.     Breath sounds: No wheezing.  Abdominal:     General: There is no distension.     Palpations: Abdomen is soft.  Musculoskeletal:        General: Normal range of motion.     Right shoulder: Normal.     Left shoulder: Normal.     Right wrist: Tenderness present.     Cervical back: Normal range of motion and neck supple.     Right hip: Normal.     Left hip: Normal.     Right knee: Deformity present.     Left knee: Normal.  Lymphadenopathy:     Head:     Right side of head: No submandibular, preauricular or posterior auricular adenopathy.     Left side of head: No submandibular, preauricular or posterior auricular adenopathy.     Cervical: No cervical adenopathy.     Right cervical: No superficial or deep cervical adenopathy.    Left cervical: No superficial or deep cervical adenopathy.  Skin:    General: Skin is warm and dry.     Coloration: Skin is not pale.     Findings: No abrasion, bruising, ecchymosis, erythema, lesion or rash.     Nails: There is no clubbing.  Neurological:     General: No focal deficit present.     Mental Status: He is alert and oriented to person, place, and time.     Sensory: No sensory deficit.     Coordination: Coordination normal.      Gait: Gait normal.  Psychiatric:        Attention and Perception: He is attentive.        Mood and Affect: Mood normal.        Speech: Speech normal.        Behavior: Behavior normal. Behavior is cooperative.        Thought Content: Thought content normal.        Judgment: Judgment normal.    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  He is also can have some paperwork filled out so he can give additional 2 weeks of free medication.  I have discussed the case with Dr. [name] 20 see him later this month.  We will try to push  through provided they are not cost preventive reasons 6 months to potentially year given that he never had removal of his prosthetic material.  Certainly if he does not succeed with this approach he will end up needing a two-stage procedure to remove his prosthesis followed by 6 weeks of antibiotics followed by implantation when the time is right and it is safe to do so.  Get labs today keeping in mind that they may be affected by his recent gout flare   Hyperkalemia: resolving.   GERD: I had stopped his PPI initially can take this now if he wants  I spent greater than 40 minutes with the patient including greater than 50% of time in face to face counsel of the patient start note regarding the nature of the prosthetic joint infection and how we treated with the antibiotics including ones on the nature of the antibiotics that we tried or could try, and in coordination of his care with his orthopedic surgeon with pharmacy and with the drug company that makes his antibiotic

## 2020-06-27 LAB — CBC WITH DIFFERENTIAL/PLATELET
Absolute Monocytes: 605 cells/uL (ref 200–950)
Basophils Absolute: 20 cells/uL (ref 0–200)
Basophils Relative: 0.3 %
Eosinophils Absolute: 20 cells/uL (ref 15–500)
Eosinophils Relative: 0.3 %
HCT: 30.5 % — ABNORMAL LOW (ref 38.5–50.0)
Hemoglobin: 10.2 g/dL — ABNORMAL LOW (ref 13.2–17.1)
Lymphs Abs: 1020 cells/uL (ref 850–3900)
MCH: 29.7 pg (ref 27.0–33.0)
MCHC: 33.4 g/dL (ref 32.0–36.0)
MCV: 88.7 fL (ref 80.0–100.0)
MPV: 9.7 fL (ref 7.5–12.5)
Monocytes Relative: 8.9 %
Neutro Abs: 5134 cells/uL (ref 1500–7800)
Neutrophils Relative %: 75.5 %
Platelets: 444 10*3/uL — ABNORMAL HIGH (ref 140–400)
RBC: 3.44 10*6/uL — ABNORMAL LOW (ref 4.20–5.80)
RDW: 14.8 % (ref 11.0–15.0)
Total Lymphocyte: 15 %
WBC: 6.8 10*3/uL (ref 3.8–10.8)

## 2020-06-27 LAB — BASIC METABOLIC PANEL WITH GFR
BUN: 22 mg/dL (ref 7–25)
CO2: 22 mmol/L (ref 20–32)
Calcium: 8.8 mg/dL (ref 8.6–10.3)
Chloride: 110 mmol/L (ref 98–110)
Creat: 1.1 mg/dL (ref 0.70–1.18)
GFR, Est African American: 76 mL/min/{1.73_m2} (ref >=60)
GFR, Est Non African American: 66 mL/min/{1.73_m2} (ref >=60)
Glucose, Bld: 84 mg/dL (ref 65–99)
Potassium: 4.9 mmol/L (ref 3.5–5.3)
Sodium: 140 mmol/L (ref 135–146)

## 2020-06-27 LAB — SEDIMENTATION RATE: Sed Rate: 108 mm/h — ABNORMAL HIGH (ref 0–20)

## 2020-06-27 LAB — C-REACTIVE PROTEIN: CRP: 12.1 mg/L — ABNORMAL HIGH (ref <8.0)

## 2020-06-28 NOTE — Progress Notes (Signed)
Subjective:   Patient ID: [name], male   DOB: 75 y.o.   MRN: [id number]   HPI Patient presents stating he has had a lot of swelling around his big toe joint right and also has a possible history of bunions and a possible history of gout and apparently has been on medication but is not taking currently   ROS      Objective:  Physical Exam  Neurovascular status intact with quite a bit of swelling around the first MPJ right structural deformity history of gout that he is not taking the allopurinol that he had been prescribed     Assessment:  Inflammatory condition with the possibility that structural bunion is a part of this versus gout and inflammatory capsulitis of the joint     Plan:  H&P reviewed condition and discussed different elements to this condition and the fact that the patient is not taking medicine as prescribed.  We discussed going back to colchicine and he will do this and I did give him sheets today discussing foods to watch out for and every day activities to do.  I did do sterile prep and injected around the first MPJ 3 mg Dexasone Kenalog 5 mg Xylocaine advised on wider shoes soaks and will be seen back as needed may require other treatments  X-rays indicate significant enlargement around the first MPJ right with signs of mild arthritis of the joint consistent with possible gouty arthritis

## 2020-07-02 ENCOUNTER — Telehealth: Payer: Self-pay

## 2020-07-02 NOTE — Telephone Encounter (Signed)
Awesome thank so much Butch Penny

## 2020-07-02 NOTE — Telephone Encounter (Signed)
[facility] Patient Advocate Encounter  Patient was approved for 30 tablets (2 week supply) of Nuzyra through the Assistance Program. Medication will be mailed to patient home.   [name] Drug Rep for [name] gave patient a 2 week supply medication.   Patient will have a month supply at no charge.     Ileene Patrick, [facility] Patient [facility] Phone: [phone] Fax:  [phone]

## 2020-07-03 ENCOUNTER — Other Ambulatory Visit: Payer: Self-pay

## 2020-07-03 ENCOUNTER — Encounter: Payer: Self-pay | Admitting: Endocrinology

## 2020-07-03 ENCOUNTER — Ambulatory Visit (INDEPENDENT_AMBULATORY_CARE_PROVIDER_SITE_OTHER): Payer: HMO | Admitting: Endocrinology

## 2020-07-03 VITALS — BP 170/74 | HR 78 | Ht 70.5 in | Wt 229.0 lb

## 2020-07-03 DIAGNOSIS — E11649 Type 2 diabetes mellitus with hypoglycemia without coma: Secondary | ICD-10-CM

## 2020-07-03 DIAGNOSIS — Z96642 Presence of left artificial hip joint: Secondary | ICD-10-CM | POA: Diagnosis not present

## 2020-07-03 LAB — POCT GLYCOSYLATED HEMOGLOBIN (HGB A1C): Hemoglobin A1C: 7.2 % — AB (ref 4.0–5.6)

## 2020-07-03 MED ORDER — GLIMEPIRIDE 2 MG PO TABS: 2.0000 mg | ORAL_TABLET | Freq: Every day | ORAL | 3 refills | Status: DC

## 2020-07-03 MED ORDER — COLESEVELAM HCL 625 MG PO TABS: 1250.0000 mg | ORAL_TABLET | Freq: Every day | ORAL | 3 refills | Status: DC

## 2020-07-03 NOTE — Progress Notes (Signed)
Subjective:    Patient ID: [name], male    DOB: May 15, 1945, 75 y.o.   MRN: [id number]  HPI pt is referred by Dr Zigmund Daniel, for diabetes.  Pt states DM was dx'ed in 3299; it is complicated by CAD and CRI; he has never been on insulin; pt says his diet and exercise are better recently; he has never had pancreatitis, pancreatic surgery, severe hypoglycemia or DKA.  He takes 2 oral meds.  He has [place] continuous glucose monitor, but does not have the PDA with him today.  He says glucose varies from 82-129.  Pt says he gets Januvia samples, as he cannot afford to buy it.   Past Medical History:  Diagnosis Date  . Arthritis   . Asthma    no inhaler  . Coronary artery disease    cardiologist-  dr spruill; last visit 3 mos ago per pt  . Enterobacter sepsis ([place]) 04/13/2020  . GERD (gastroesophageal reflux disease)   . History of MI (myocardial infarction)    1985  . Hydronephrosis, left   . Hypertension   . Myocardial infarction ([place])   . Nephrolithiasis    left  . 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 Presence of tooth-root and mandibular implants    lower dental implants  . Prostate cancer ([place])   . 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 Shortness of breath    WITH EXERTION  . Thigh abscess 04/13/2020  . Type 2 diabetes mellitus ([place])     Past Surgical History:  Procedure Laterality Date  . BUNIONECTOMY    . CYSTOSCOPY W/ RETROGRADES Left 03/14/2014   Procedure: CYSTOSCOPY WITH LEFT RETROGRADE PYELOGRAM, Left Ureteroscopy, Lweft ureteral Stent No string;  Surgeon: [name], MD;  Location: [facility];  Service: Urology;  Laterality: Left;  . CYSTOSCOPY W/ URETERAL STENT PLACEMENT Bilateral 01/15/2020   Procedure: CYSTOSCOPY WITH RETROGRADE PYELOGRAM/URETERAL STENT PLACEMENT;  Surgeon: Alexis Frock, MD;  Location[facility];  Service: Urology;  Laterality: Bilateral;  . CYSTOSCOPY/URETEROSCOPY/HOLMIUM LASER/STENT PLACEMENT Bilateral  03/17/2020   Procedure: CYSTOSCOPY/URETEROSCOPY/HOLMIUM LASER/STENT LEFT PLACEMENT;  Surgeon: Festus Aloe, MD;  [facility];  Service: Urology;  Laterality: Bilateral;  . HERNIA REPAIR    . INCISION AND DRAINAGE Left 03/09/2020   Procedure: INCISION AND DRAINAGE LEFT HIP WITH LINER EXCHANGE;  Surgeon: Gaynelle Arabian, MD;  [facility];  Service: Orthopedics;  Laterality: Left;  . IR FLUORO GUIDE CV LINE RIGHT  03/10/2020  . IR RADIOLOGIST EVAL & MGMT  04/01/2020  . IR RADIOLOGIST EVAL & MGMT  04/15/2020  . IR RADIOLOGIST EVAL & MGMT  04/30/2020  . IR REMOVAL TUN CV CATH W/O FL  04/22/2020  . IR US GUIDE BX ASP/DRAIN  03/17/2020  . IR US GUIDE VASC ACCESS RIGHT  03/10/2020  . PROSTATE BIOPSY    . TOTAL HIP ARTHROPLASTY Left 03-20-2009  . TOTAL HIP ARTHROPLASTY  04/09/2012   Procedure: TOTAL HIP ARTHROPLASTY;  Surgeon: [name], MD;  [facility];  Service: Orthopedics;  Laterality: Right;  . TOTAL KNEE ARTHROPLASTY Left 05-16-2008    Social History   Socioeconomic History  . Marital status: Widowed    Spouse name: Not on file  . Number of children: Not on file  . Years of education: Not on file  . Highest education level: Not on file  Occupational History  . Not on file  Tobacco Use  . Smoking status: Former Smoker  Packs/day: 1.00    Years: 25.00    Pack years: 25.00    Types: Cigarettes    Quit date: 04/11/1984    Years since quitting: 36.2  . Smokeless tobacco: Former Systems developer    Quit date: 04/02/1985  Vaping Use  . Vaping Use: Never used  Substance and Sexual Activity  . Alcohol use: Not Currently    Comment: RARE  . Drug use: No  . Sexual activity: Not Currently  Other Topics Concern  . Not on file  Social History Narrative  . Not on file   Social Determinants of Health   Financial Resource Strain: Not on file  Food Insecurity: Not on file  Transportation Needs: Not on file  Physical Activity: Not on file  Stress: Not on file  Social  Connections: Not on file  Intimate Partner Violence: Not on file    Current Outpatient Medications on File Prior to Visit  Medication Sig Dispense Refill  . allopurinol (ZYLOPRIM) 100 MG tablet Take 0.5 tablets (50 mg total) by mouth daily. 45 tablet 1  . amiodarone (PACERONE) 200 MG tablet Take 200 mg by mouth daily.    . Aspirin 81 MG CAPS Take 81 mg by mouth daily.    Marland Kitchen atorvastatin (LIPITOR) 40 MG tablet TAKE 1 TABLET BY MOUTH EVERY DAY FOR HYPERLIPIDEMIA 90 tablet 1  . colchicine 0.6 MG tablet TAKE 1 TABLET BY MOUTH EVERY DAY 30 tablet 1  . Continuous Blood Gluc Receiver (FREESTYLE LIBRE 14 DAY READER) DEVI See admin instructions.    . Continuous Blood Gluc Sensor (FREESTYLE LIBRE 14 DAY SENSOR) MISC 1 Device by Does not apply route every 14 (fourteen) days. 6 each 1  . cycloSPORINE (RESTASIS) 0.05 % ophthalmic emulsion Place 1 drop into both eyes 2 (two) times daily.     Marland Kitchen docusate sodium (COLACE) 100 MG capsule Take 100 mg by mouth daily as needed for mild constipation.    . ferrous sulfate 325 (65 FE) MG tablet Take 325 mg by mouth daily with breakfast.    . metoprolol tartrate (LOPRESSOR) 50 MG tablet Take 1 tablet (50 mg total) by mouth 2 (two) times daily. 90 tablet 1  . Omadacycline Tosylate (NUZYRA) 150 MG TABS Take 3 tablets by mouth for 2 days and then 2 tablets by mouth daily, no food for 4 hours before and 2hours after each dose 30 tablet 11  . omeprazole (PRILOSEC) 40 MG capsule TAKE 1 CAPSULE BY MOUTH TWICE A DAY FOR GERD 90 capsule 0  . sitaGLIPtin (JANUVIA) 50 MG tablet Take 1 tablet (50 mg total) by mouth daily. 30 tablet 0   No current facility-administered medications on file prior to visit.    Allergies  Allergen Reactions  . Shellfish Allergy Rash    Family History  Problem Relation Age of Onset  . Cancer Mother        breast  . Multiple sclerosis Daughter   . Cancer Father        prostate  . Diabetes Father   . Cancer Maternal Uncle        bone     BP (!) 170/74 (BP Location: Right Arm, Patient Position: Sitting, Cuff Size: Large)   Pulse 78   Ht 5' 10.5" (1.791 m)   Wt 229 lb (103.9 kg)   SpO2 98%   BMI 32.39 kg/m     Review of Systems denies weight loss, blurry vision, chest pain, sob, n/v, hypoglycemia, memory loss, and depression.  He has  intermitt leg edema.       Objective:   Physical Exam VITAL SIGNS:  See vs page GENERAL: no distress Pulses: dorsalis pedis intact bilat.   MSK: no deformity of the feet CV: no leg edema Skin:  no ulcer on the feet, but she skin is dry.  normal color and temp on the feet.   Neuro: sensation is intact to touch on the feet.    Lab Results  Component Value Date   CREATININE 1.10 06/26/2020   BUN 22 06/26/2020   NA 140 06/26/2020   K 4.9 06/26/2020   CL 110 06/26/2020   CO2 22 06/26/2020   A1c=7.2%  I have reviewed outside records, and summarized: Pt was noted to have elevated A1c, and referred here.  He was also seen by ID for infection of prosthetic joint.       Assessment & Plan:  HTN: is noted today.  Type 2 DM, with CRI: uncontrolled. In order to safely reduce A1c, we should reduce SU rx  Patient Instructions  Your blood pressure is high today.  Please see your primary care provider soon, to have it rechecked good diet and exercise significantly improve the control of your diabetes.  please let me know if you wish to be referred to a dietician.  high blood sugar is very risky to your health.  you should see an eye doctor and dentist every year.  It is very important to get all recommended vaccinations.  Controlling your blood pressure and cholesterol drastically reduces the damage diabetes does to your body.  Those who smoke should quit.  Please discuss these with your doctor.  check your blood sugar once a day.  vary the time of day when you check, between before the 3 meals, and at bedtime.  also check if you have symptoms of your blood sugar being too high or too  low.  please keep a record of the readings and bring it to your next appointment here (or you can bring the meter itself).  You can write it on any piece of paper.  please call us sooner if your blood sugar goes below 70, or if most of your readings are over 200. We will need to take this complex situation in stages For now, please: Reduce the glimepiride to 2 mg each morning, and:  Please continue the same Januvia, and:  I have sent a prescription to your pharmacy, to add "colesevelam."  Please come back for a follow-up appointment in 2 months.

## 2020-07-03 NOTE — Patient Instructions (Addendum)
Your blood pressure is high today.  Please see your primary care provider soon, to have it rechecked good diet and exercise significantly improve the control of your diabetes.  please let me know if you wish to be referred to a dietician.  high blood sugar is very risky to your health.  you should see an eye doctor and dentist every year.  It is very important to get all recommended vaccinations.  Controlling your blood pressure and cholesterol drastically reduces the damage diabetes does to your body.  Those who smoke should quit.  Please discuss these with your doctor.  check your blood sugar once a day.  vary the time of day when you check, between before the 3 meals, and at bedtime.  also check if you have symptoms of your blood sugar being too high or too low.  please keep a record of the readings and bring it to your next appointment here (or you can bring the meter itself).  You can write it on any piece of paper.  please call us sooner if your blood sugar goes below 70, or if most of your readings are over 200. We will need to take this complex situation in stages For now, please: Reduce the glimepiride to 2 mg each morning, and:  Please continue the same Januvia, and:  I have sent a prescription to your pharmacy, to add "colesevelam."  Please come back for a follow-up appointment in 2 months.

## 2020-07-09 ENCOUNTER — Telehealth: Payer: Self-pay

## 2020-07-09 NOTE — Telephone Encounter (Signed)
[name] called with daily blood sugar readings.  3/8 60, 99, 114 3/9 140, 93 3/10 109 3/11 127 3/12 91 3/13 113, 140 3/14 136, 119 3/15 107 3/16 141 3/17 99, 107 3/18 108, 154 3/19 154 3/20 141 3/21 123 3/22 89 3/27 60 3/28 127 3/29 83 3/30 77 3/31 78

## 2020-07-09 NOTE — Telephone Encounter (Signed)
Those are looking pretty good.  Continue current medications.

## 2020-07-10 NOTE — Telephone Encounter (Signed)
Patient has  been advised of Dr. West Pugh response. Appt scheduled for May

## 2020-07-12 ENCOUNTER — Other Ambulatory Visit: Payer: Self-pay | Admitting: Family Medicine

## 2020-07-15 ENCOUNTER — Other Ambulatory Visit: Payer: Self-pay | Admitting: Family Medicine

## 2020-07-17 ENCOUNTER — Other Ambulatory Visit (HOSPITAL_COMMUNITY): Payer: Self-pay

## 2020-07-30 ENCOUNTER — Other Ambulatory Visit (HOSPITAL_COMMUNITY): Payer: Self-pay

## 2020-08-01 ENCOUNTER — Emergency Department (HOSPITAL_COMMUNITY): Payer: HMO

## 2020-08-01 ENCOUNTER — Encounter (HOSPITAL_COMMUNITY): Payer: Self-pay

## 2020-08-01 ENCOUNTER — Emergency Department (HOSPITAL_COMMUNITY)
Admission: EM | Admit: 2020-08-01 | Discharge: 2020-08-01 | Disposition: A | Discharge status: Home / Self Care | Payer: HMO | Attending: Emergency Medicine | Admitting: Emergency Medicine

## 2020-08-01 DIAGNOSIS — Z96652 Presence of left artificial knee joint: Secondary | ICD-10-CM | POA: Diagnosis not present

## 2020-08-01 DIAGNOSIS — I517 Cardiomegaly: Secondary | ICD-10-CM | POA: Diagnosis not present

## 2020-08-01 DIAGNOSIS — R0789 Other chest pain: Secondary | ICD-10-CM | POA: Diagnosis not present

## 2020-08-01 DIAGNOSIS — I2699 Other pulmonary embolism without acute cor pulmonale: Secondary | ICD-10-CM | POA: Diagnosis not present

## 2020-08-01 DIAGNOSIS — I1310 Hypertensive heart and chronic kidney disease without heart failure, with stage 1 through stage 4 chronic kidney disease, or unspecified chronic kidney disease: Secondary | ICD-10-CM | POA: Insufficient documentation

## 2020-08-01 DIAGNOSIS — J45909 Unspecified asthma, uncomplicated: Secondary | ICD-10-CM | POA: Insufficient documentation

## 2020-08-01 DIAGNOSIS — Z87891 Personal history of nicotine dependence: Secondary | ICD-10-CM | POA: Diagnosis not present

## 2020-08-01 DIAGNOSIS — Z7984 Long term (current) use of oral hypoglycemic drugs: Secondary | ICD-10-CM | POA: Diagnosis not present

## 2020-08-01 DIAGNOSIS — R079 Chest pain, unspecified: Secondary | ICD-10-CM | POA: Diagnosis not present

## 2020-08-01 DIAGNOSIS — K219 Gastro-esophageal reflux disease without esophagitis: Secondary | ICD-10-CM | POA: Insufficient documentation

## 2020-08-01 DIAGNOSIS — Z8546 Personal history of malignant neoplasm of prostate: Secondary | ICD-10-CM | POA: Insufficient documentation

## 2020-08-01 DIAGNOSIS — Z96643 Presence of artificial hip joint, bilateral: Secondary | ICD-10-CM | POA: Diagnosis not present

## 2020-08-01 DIAGNOSIS — Z7982 Long term (current) use of aspirin: Secondary | ICD-10-CM | POA: Diagnosis not present

## 2020-08-01 DIAGNOSIS — E119 Type 2 diabetes mellitus without complications: Secondary | ICD-10-CM | POA: Insufficient documentation

## 2020-08-01 DIAGNOSIS — Z79899 Other long term (current) drug therapy: Secondary | ICD-10-CM | POA: Insufficient documentation

## 2020-08-01 DIAGNOSIS — N189 Chronic kidney disease, unspecified: Secondary | ICD-10-CM | POA: Insufficient documentation

## 2020-08-01 DIAGNOSIS — R7989 Other specified abnormal findings of blood chemistry: Secondary | ICD-10-CM | POA: Diagnosis not present

## 2020-08-01 DIAGNOSIS — I2510 Atherosclerotic heart disease of native coronary artery without angina pectoris: Secondary | ICD-10-CM | POA: Diagnosis not present

## 2020-08-01 LAB — BASIC METABOLIC PANEL
Anion gap: 6 (ref 5–15)
BUN: 26 mg/dL — ABNORMAL HIGH (ref 8–23)
CO2: 21 mmol/L — ABNORMAL LOW (ref 22–32)
Calcium: 9.1 mg/dL (ref 8.9–10.3)
Chloride: 112 mmol/L — ABNORMAL HIGH (ref 98–111)
Creatinine, Ser: 1.17 mg/dL (ref 0.61–1.24)
GFR, Estimated: >60 mL/min (ref >60)
Glucose, Bld: 92 mg/dL (ref 70–99)
Potassium: 5.2 mmol/L — ABNORMAL HIGH (ref 3.5–5.1)
Sodium: 139 mmol/L (ref 135–145)

## 2020-08-01 LAB — CBC WITH DIFFERENTIAL/PLATELET
Abs Immature Granulocytes: 0.01 10*3/uL (ref 0.00–0.07)
Basophils Absolute: 0 10*3/uL (ref 0.0–0.1)
Basophils Relative: 0 %
Eosinophils Absolute: 0.2 10*3/uL (ref 0.0–0.5)
Eosinophils Relative: 3 %
HCT: 32 % — ABNORMAL LOW (ref 39.0–52.0)
Hemoglobin: 10.5 g/dL — ABNORMAL LOW (ref 13.0–17.0)
Immature Granulocytes: 0 %
Lymphocytes Relative: 21 %
Lymphs Abs: 1.1 10*3/uL (ref 0.7–4.0)
MCH: 29.8 pg (ref 26.0–34.0)
MCHC: 32.8 g/dL (ref 30.0–36.0)
MCV: 90.9 fL (ref 80.0–100.0)
Monocytes Absolute: 0.6 10*3/uL (ref 0.1–1.0)
Monocytes Relative: 11 %
Neutro Abs: 3.4 10*3/uL (ref 1.7–7.7)
Neutrophils Relative %: 65 %
Platelets: 369 10*3/uL (ref 150–400)
RBC: 3.52 MIL/uL — ABNORMAL LOW (ref 4.22–5.81)
RDW: 15.1 % (ref 11.5–15.5)
WBC: 5.2 10*3/uL (ref 4.0–10.5)
nRBC: 0 % (ref 0.0–0.2)

## 2020-08-01 LAB — TROPONIN I (HIGH SENSITIVITY)
Troponin I (High Sensitivity): 4 ng/L (ref <18)
Troponin I (High Sensitivity): 2 ng/L (ref <18)

## 2020-08-01 LAB — D-DIMER, QUANTITATIVE: D-Dimer, Quant: 5.85 ug/mL-FEU — ABNORMAL HIGH (ref 0.00–0.50)

## 2020-08-01 MED ORDER — IOHEXOL 350 MG/ML SOLN
100.0000 mL | Freq: Once | INTRAVENOUS | Status: AC | PRN
  Administered 2020-08-01: 100 mL via INTRAVENOUS

## 2020-08-01 NOTE — ED Provider Notes (Signed)
[facility] DEPT Provider Note   CSN: [id number] Arrival date & time: 08/01/20  0740     History Chief Complaint  Patient presents with  . Chest Pain    [name] is a 75 y.o. male with past medical history significant for arthritis, CAD, GERD, MI, hypertension, prostate cancer, type 2 diabetes.  Patient not anticoagulated.  HPI Patient presents to emergency department today with chief complaint of sudden onset of chest pain.  Pain started 4 hours prior to arrival.  He states he woke up this morning around 4 AM to let his dog out.  He noticed that he had chest pressure.  It was located on the right side of his chest.  He states pressure is only present when he is taking a deep breath.  He states it last for seconds at a time. He describes pressure as sharp and burning. Pressure is not exertional.  He has relief after belching. He rates discomfort 1 out of 10 in severity.  He denies any associated shortness of breath.  He did not take any medications for symptoms prior to arrival.  Patient states he had an MI in his 100s.  He remembers that the pain started as a pressure like this. He ate a pimento cheese sandwich yesterday, typically does not have GERD exacerbation from cheese products. Patient works out at [employer] regularly. He denies any injury, recent muscle strain or increase in weights. He denies any recent travel or long period of immobilization.  He admits to family history of cardiac disease stating multiple immediate family members have had heart attacks.  Patient does not smoke.  Denies any history of blood clots.  Denies any diaphoresis fever, chills, generalized weakness, cough, congestion, syncope, palpitations, shortness of breath, nausea, abdominal pain, back pain, lower extremity edema, blood in stool, black tarry stool.   Past Medical History:  Diagnosis Date  . Arthritis   . Asthma    no inhaler  . Coronary artery disease     cardiologist-  dr spruill; last visit 3 mos ago per pt  . Enterobacter sepsis ([place]) 04/13/2020  . GERD (gastroesophageal reflux disease)   . History of MI (myocardial infarction)    1985  . Hydronephrosis, left   . Hypertension   . Myocardial infarction ([place])   . Nephrolithiasis    left  . 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 Presence of tooth-root and mandibular implants    lower dental implants  . Prostate cancer ([place])   . 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 Shortness of breath    WITH EXERTION  . Thigh abscess 04/13/2020  . Type 2 diabetes mellitus [facility])     Patient Active Problem List   Diagnosis Date Noted  . 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 Enterobacter sepsis ([place]) 04/13/2020  . Thigh abscess 04/13/2020  . CKD (chronic kidney disease) 04/13/2020  . Bacteremia   . SBO (small bowel obstruction) ([place])   . Abdominal distension   . Effusion of hip joint, left   . Hip pain 03/03/2020  . Gout of right hand 02/23/2020  . Hyperkalemia 01/14/2020  . Hydroureteronephrosis 01/14/2020  . Colon cancer screening 04/28/2019  . Intermittent claudication ([place]) 01/22/2019  . GERD (gastroesophageal reflux disease) 10/23/2018  . Pseudophakia 08/11/2016  . Uncontrolled type 2 diabetes mellitus with hyperglycemia, without long-term current use of insulin ([place]) 06/26/2016  . Hyperlipidemia with target LDL less than 70 06/26/2016  .  Angina pectoris ([place]) 06/26/2016  . Type 2 diabetes mellitus with hypoglycemia without coma ([place]) 06/24/2016  . Essential hypertension 06/24/2016  . CAD (coronary artery disease) 06/24/2016  . Asthma 06/24/2016  . Malignant neoplasm of prostate ([place]) 12/28/2015  . OA (osteoarthritis) of hip 04/09/2012    Past Surgical History:  Procedure Laterality Date  . BUNIONECTOMY    . CYSTOSCOPY W/ RETROGRADES Left 03/14/2014   Procedure: CYSTOSCOPY WITH LEFT RETROGRADE PYELOGRAM, Left Ureteroscopy, [place] ureteral Stent No  string;  Surgeon: [name], MD;  Location: [facility];  Service: Urology;  Laterality: Left;  . CYSTOSCOPY W/ URETERAL STENT PLACEMENT Bilateral 01/15/2020   Procedure: CYSTOSCOPY WITH RETROGRADE PYELOGRAM/URETERAL STENT PLACEMENT;  Surgeon: Alexis Frock, MD;  Location: [facility];  Service: Urology;  Laterality: Bilateral;  . CYSTOSCOPY/URETEROSCOPY/HOLMIUM LASER/STENT PLACEMENT Bilateral 03/17/2020   Procedure: CYSTOSCOPY/URETEROSCOPY/HOLMIUM LASER/STENT LEFT PLACEMENT;  Surgeon: Festus Aloe, MD;  Location: [facility];  Service: Urology;  Laterality: Bilateral;  . HERNIA REPAIR    . INCISION AND DRAINAGE Left 03/09/2020   Procedure: INCISION AND DRAINAGE LEFT HIP WITH LINER EXCHANGE;  Surgeon: Gaynelle Arabian, MD;  Location: [facility];  Service: Orthopedics;  Laterality: Left;  . IR FLUORO GUIDE CV LINE RIGHT  03/10/2020  . IR RADIOLOGIST EVAL & MGMT  04/01/2020  . IR RADIOLOGIST EVAL & MGMT  04/15/2020  . IR RADIOLOGIST EVAL & MGMT  04/30/2020  . IR REMOVAL TUN CV CATH W/O FL  04/22/2020  . IR US GUIDE BX ASP/DRAIN  03/17/2020  . IR US GUIDE VASC ACCESS RIGHT  03/10/2020  . PROSTATE BIOPSY    . TOTAL HIP ARTHROPLASTY Left 03-20-2009  . TOTAL HIP ARTHROPLASTY  04/09/2012   Procedure: TOTAL HIP ARTHROPLASTY;  Surgeon: [name], MD;  Location: [facility];  Service: Orthopedics;  Laterality: Right;  . TOTAL KNEE ARTHROPLASTY Left 05-16-2008       Family History  Problem Relation Age of Onset  . Cancer Mother        breast  . Multiple sclerosis Daughter   . Cancer Father        prostate  . Diabetes Father   . Cancer Maternal Uncle        bone    Social History   Tobacco Use  . Smoking status: Former Smoker    Packs/day: 1.00    Years: 25.00    Pack years: 25.00    Types: Cigarettes    Quit date: 04/11/1984    Years since quitting: 36.3  . Smokeless tobacco: Former Systems developer    Quit date: 04/02/1985  Vaping Use  . Vaping Use: Never used  Substance Use Topics  .  Alcohol use: Not Currently    Comment: RARE  . Drug use: No    Home Medications Prior to Admission medications   Medication Sig Start Date End Date Taking? Authorizing Provider  allopurinol (ZYLOPRIM) 100 MG tablet Take 0.5 tablets (50 mg total) by mouth daily. 05/19/20   Luetta Nutting, DO  amiodarone (PACERONE) 200 MG tablet Take 200 mg by mouth daily. 05/18/20   [provider]  Aspirin 81 MG CAPS Take 81 mg by mouth daily.    [provider]  atorvastatin (LIPITOR) 40 MG tablet TAKE 1 TABLET BY MOUTH EVERY DAY FOR HYPERLIPIDEMIA 06/15/20   Luetta Nutting, DO  colchicine 0.6 MG tablet TAKE 1 TABLET BY MOUTH EVERY DAY 07/16/20   Luetta Nutting, DO  colesevelam [facility]) 625 MG tablet Take 2 tablets (1,250 mg total)  by mouth daily. 07/03/20   Renato Shin, MD  Continuous Blood Gluc Receiver (FREESTYLE LIBRE 14 DAY READER) [place] See admin instructions. 05/23/19   [provider]  Continuous Blood Gluc Sensor (FREESTYLE LIBRE 14 DAY SENSOR) MISC 1 Device by Does not apply route every 14 (fourteen) days. 05/23/19   Luetta Nutting, DO  cycloSPORINE (RESTASIS) 0.05 % ophthalmic emulsion Place 1 drop into both eyes 2 (two) times daily.  09/23/19   [provider]  docusate sodium (COLACE) 100 MG capsule Take 100 mg by mouth daily as needed for mild constipation.    [provider]  ferrous sulfate 325 (65 FE) MG tablet Take 325 mg by mouth daily with breakfast.    [provider]  glimepiride (AMARYL) 2 MG tablet Take 1 tablet (2 mg total) by mouth daily before breakfast. 07/03/20   Renato Shin, MD  metoprolol tartrate (LOPRESSOR) 50 MG tablet Take 1 tablet (50 mg total) by mouth 2 (two) times daily. 06/15/20   Luetta Nutting, DO  Omadacycline Tosylate 150 MG TABS TAKE 3 TABLETS BY MOUTH FOR 2 DAYS THEN 2 TABLETS DAILY, NO FOOD FOR 4 HOURS BEFORE AND 2 HOURS AFTER EACH DOSE 06/19/20 06/19/21  Truman Hayward, MD  omeprazole (PRILOSEC) 40 MG capsule TAKE 1  CAPSULE BY MOUTH TWICE A DAY FOR GERD 06/11/20   Luetta Nutting, DO  sitaGLIPtin (JANUVIA) 50 MG tablet Take 1 tablet (50 mg total) by mouth daily. 06/26/16   Orson Eva, MD    Allergies    Shellfish allergy  Review of Systems   Review of Systems All other systems are reviewed and are negative for acute change except as noted in the HPI.  Physical Exam Updated Vital Signs BP (!) 184/77 (BP Location: Right Arm)   Pulse (!) 58   Temp 97.8 F (36.6 C) (Oral)   Resp 18   SpO2 100%   Physical Exam Vitals and nursing note reviewed.  Constitutional:      General: He is not in acute distress.    Appearance: He is not ill-appearing.  HENT:     Head: Normocephalic and atraumatic.     Right Ear: Tympanic membrane and external ear normal.     Left Ear: Tympanic membrane and external ear normal.     Nose: Nose normal.     Mouth/Throat:     Mouth: Mucous membranes are moist.     Pharynx: Oropharynx is clear.  Eyes:     General: No scleral icterus.       Right eye: No discharge.        Left eye: No discharge.     Extraocular Movements: Extraocular movements intact.     Conjunctiva/sclera: Conjunctivae normal.     Pupils: Pupils are equal, round, and reactive to light.  Neck:     Vascular: No JVD.  Cardiovascular:     Rate and Rhythm: Normal rate and regular rhythm.     Pulses: Normal pulses.          Radial pulses are 2+ on the right side and 2+ on the left side.     Heart sounds: Normal heart sounds.  Pulmonary:     Comments: Lungs clear to auscultation in all fields. Symmetric chest rise. No wheezing, rales, or rhonchi. Chest:     Chest wall: No tenderness.  Abdominal:     Comments: Abdomen is soft, non-distended, and non-tender in all quadrants. No rigidity, no guarding. No peritoneal signs.  Musculoskeletal:  General: Normal range of motion.     Cervical back: Normal range of motion.     Right lower leg: No edema.     Left lower leg: No edema.  Skin:    General:  Skin is warm and dry.     Capillary Refill: Capillary refill takes less than 2 seconds.  Neurological:     Mental Status: He is oriented to person, place, and time.     GCS: GCS eye subscore is 4. GCS verbal subscore is 5. GCS motor subscore is 6.     Comments: Fluent speech, no facial droop.  Psychiatric:        Behavior: Behavior normal.     ED Results / Procedures / Treatments   Labs (all labs ordered are listed, but only abnormal results are displayed) Labs Reviewed  CBC WITH DIFFERENTIAL/PLATELET - Abnormal; Notable for the following components:      Result Value   RBC 3.52 (*)    Hemoglobin 10.5 (*)    HCT 32.0 (*)    All other components within normal limits  BASIC METABOLIC PANEL - Abnormal; Notable for the following components:   Potassium 5.2 (*)    Chloride 112 (*)    CO2 21 (*)    BUN 26 (*)    All other components within normal limits  D-DIMER, QUANTITATIVE - Abnormal; Notable for the following components:   D-Dimer, Quant 5.85 (*)    All other components within normal limits  TROPONIN I (HIGH SENSITIVITY)  TROPONIN I (HIGH SENSITIVITY)    EKG EKG Interpretation  Date/Time:  Saturday August 01 2020 07:47:05 EDT Ventricular Rate:  56 PR Interval:  296 QRS Duration: 140 QT Interval:  450 QTC Calculation: 435 R Axis:   53 Text Interpretation: Sinus rhythm Prolonged PR interval Nonspecific intraventricular conduction delay since last tracing no significant change Confirmed by Daleen Bo [phone]) on 08/01/2020 7:57:21 AM   Radiology DG Chest 2 View  Result Date: 08/01/2020 CLINICAL DATA:  Right-sided chest pain beginning this morning. EXAM: CHEST - 2 VIEW COMPARISON:  03/08/2020 FINDINGS: Stable mild cardiomegaly. Both lungs are clear. No evidence of pleural effusion. IMPRESSION: Stable mild cardiomegaly. No active lung disease. Electronically Signed   By: Marlaine Hind M.D.   On: 08/01/2020 08:32   CT Angio Chest PE W and/or Wo Contrast  Result Date:  08/01/2020 CLINICAL DATA:  PE suspected with low to intermediate probability. Positive D-dimer. Chest pain. No shortness of breath. EXAM: CT ANGIOGRAPHY CHEST WITH CONTRAST TECHNIQUE: Multidetector CT imaging of the chest was performed using the standard protocol during bolus administration of intravenous contrast. Multiplanar CT image reconstructions and MIPs were obtained to evaluate the vascular anatomy. CONTRAST:  155mL OMNIPAQUE IOHEXOL 350 MG/ML SOLN COMPARISON:  Chest x-ray August 01, 2020 FINDINGS: Cardiovascular: Calcified atherosclerosis is seen in the left main, LAD, left circumflex, and right coronary arteries. The heart size is borderline to mildly enlarged. Mild calcified atherosclerosis is seen in the nonaneurysmal aorta. No dissection identified. The main pulmonary artery is normal in caliber. There is some respiratory motion in the bases mildly limiting evaluation for pulmonary emboli in the distal lower lobe pulmonary arterial branches. Within these limitations, no pulmonary emboli identified. Mediastinum/Nodes: Right greater than left gynecomastia is identified. The thyroid and esophagus are normal. No adenopathy. No effusions. Lungs/Pleura: Central airways are normal. No pneumothorax. No pulmonary nodules, masses, or infiltrates. Upper Abdomen: A low-attenuation lesion in the spleen is most likely a cyst or hemangioma, of no significance.  No other abnormalities in the upper abdomen. Musculoskeletal: Degenerative changes in the thoracic spine. Review of the MIP images confirms the above findings. IMPRESSION: 1. Evaluation for pulmonary emboli is mildly limited due to respiratory motion but no emboli are identified. 2. Calcified atherosclerosis in the coronary arteries as above. 3. The heart size is borderline to mildly enlarged. 4. Right greater than left gynecomastia. 5. Cyst or hemangioma in the spleen of no significance. 6. Degenerative changes in the thoracic spine. Aortic Atherosclerosis  (ICD10-I70.0). Electronically Signed   By: Dorise Bullion III M.D   On: 08/01/2020 10:55    Procedures Procedures   Medications Ordered in ED Medications  iohexol (OMNIPAQUE) 350 MG/ML injection 100 mL (100 mLs Intravenous Contrast Given 08/01/20 0956)    ED Course  I have reviewed the triage vital signs and the nursing notes.  Pertinent labs & imaging results that were available during my care of the patient were reviewed by me and considered in my medical decision making (see chart for details).    MDM Rules/Calculators/A&P                          History provided by patient with additional history obtained from chart review.    Presenting with chest pressure. Afebrile, mildly hypertensive in triage 184/77, did take his morning blood pressure medicine.  He is currently working with PCP for hypertension medication adjustment. Patient resting comfortably on stretcher.  He is well-appearing in no acute distress.  He is pain-free currently.  Lungs are clear to auscultation all fields and he has normal work of breathing.  He admitted to having chest pressure when taking a deep breath for lung exam.  No lower extremity edema.  No abdominal tenderness.  He denies need for pain medication at this time.  EKG without ischemic changes.  He has a heart score of 4.  Patient placed on cardiac monitor. I viewed pt's chest xray and it does not suggest acute infectious processes, shows stable cardiomegaly. Agree with radiologist impression. CBC unremarkable, hemoglobin consistent with baseline. BMP with mild hyperkalemia 5.1, BUN mildly elevated at 26, creatinine 1.17 Delta troponin negative. D-dimer obtained given pleuritic chest pain and is elevated at 5.85. CTA obtained given elevated dimer and shows no obvious PE.  No infectious or acute findings.  Patient assessed by ED attending Dr. Eulis Foster and reported overhead repetitive movement at the [facility] yesterday with weights.  Updated patient on results.  He  is pain-free still.  He has not required any need for analgesics here.  ACS and PE unlikely cause of his chest pain.  Presentation not consistent with dissection either.  Discussed patient's blood pressure with him as it remains elevated.  Patient has a normal neuro exam, no focal weakness.  He is pain-free.  No signs of endorgan damage on lab work here.  ED attending agreeable with plan for patient to follow-up outpatient with PCP this week to have blood pressure rechecked.  Advised him to take it at home daily and keep a log.  If patient becomes symptomatic he knows to return to the ER.  Strict return precautions were discussed.  Patient agreeable with plan of care.     Portions of this note were generated with [employer]. Dictation errors may occur despite best attempts at proofreading.     Final Clinical Impression(s) / ED Diagnoses Final diagnoses:  Atypical chest pain    Rx / DC Orders ED Discharge Orders  None       Lewanda Rife 08/01/20 1153    Daleen Bo, MD 08/01/20 1711

## 2020-08-01 NOTE — Discharge Instructions (Addendum)
Follow up with your primary care doctor next week to have your blood pressure rechecked since it was elevated in the emergency room.  You should also check it daily at home. If blood pressure remains elevated and you have headache, visual changes, chest pain, numbness or weakness you should return to ER    Return to ER for any new or worsening symptoms.

## 2020-08-01 NOTE — ED Notes (Signed)
Patient transported to CT 

## 2020-08-01 NOTE — ED Provider Notes (Signed)
  Face-to-face evaluation   History: He presents for evaluation of chest discomfort noted as "heaviness," when he woke this morning.  Since it started, it has partially resolved.  He denies shortness of breath, cough, fever, chills, nausea or vomiting.  Yesterday, he was using a machine, overhead pull downs, which he does not usually do, while he was exercising at the [facility].  He has a remote history of cardiac disease/MI, 85.  He continues to see cardiology.  There are no other known modifying factors.  Physical exam: Alert elderly male who is calm comfortable.  Chest nontender to palpation.  Heart regular rate and rhythm.  Lungs clear anteriorly.  Abdomen soft and nontender to palpation.  Medical screening examination/treatment/procedure(s) were conducted as a shared visit with non-physician practitioner(s) and myself.  I personally evaluated the patient during the encounter    Daleen Bo, MD 08/01/20 [phone]

## 2020-08-01 NOTE — ED Triage Notes (Signed)
Pt arrived via walk in, c/o right sided chest tightness. Non reproducible, continuous that started this morning shortly after waking up. Denies any worsening SOB outside of baseline.

## 2020-08-03 ENCOUNTER — Telehealth: Payer: Self-pay | Admitting: General Practice

## 2020-08-03 NOTE — Telephone Encounter (Signed)
Transition Care Management Follow-up Telephone Call  Date of discharge and from where: [name] 08/01/20  How have you been since you were released from the hospital? Feeling ok  Any questions or concerns? No  Items Reviewed:  Did the pt receive and understand the discharge instructions provided? Yes   Medications obtained and verified? Yes   Other? No   Any new allergies since your discharge? No   Dietary orders reviewed? Yes  Do you have support at home? Yes   Home Care and Equipment/Supplies: Were home health services ordered? no   Functional Questionnaire: (I = Independent and D = Dependent) ADLs: I  Bathing/Dressing- I  Meal Prep- I  Eating- I  Maintaining continence- I  Transferring/Ambulation- I  Managing Meds- I  Follow up appointments reviewed:   PCP Hospital f/u appt confirmed? Yes  Scheduled to see Dr. Zigmund Daniel on 08/05/20 @ 1010.  [facility] f/u appt confirmed? No  He was referred only to the PCP at this time.  Are transportation arrangements needed? No   If their condition worsens, is the pt aware to call PCP or go to the Emergency Dept.? Yes  Was the patient provided with contact information for the PCP's office or ED? Yes  Was to pt encouraged to call back with questions or concerns? Yes

## 2020-08-05 ENCOUNTER — Inpatient Hospital Stay: Payer: HMO | Admitting: Family Medicine

## 2020-08-05 ENCOUNTER — Other Ambulatory Visit (HOSPITAL_COMMUNITY): Payer: Self-pay

## 2020-08-07 ENCOUNTER — Ambulatory Visit (INDEPENDENT_AMBULATORY_CARE_PROVIDER_SITE_OTHER): Payer: HMO | Admitting: Family Medicine

## 2020-08-07 ENCOUNTER — Other Ambulatory Visit: Payer: Self-pay

## 2020-08-07 ENCOUNTER — Encounter: Payer: Self-pay | Admitting: Family Medicine

## 2020-08-07 VITALS — BP 178/73 | HR 56 | Ht 71.0 in | Wt 226.0 lb

## 2020-08-07 DIAGNOSIS — L298 Other pruritus: Secondary | ICD-10-CM

## 2020-08-07 DIAGNOSIS — I10 Essential (primary) hypertension: Secondary | ICD-10-CM

## 2020-08-07 DIAGNOSIS — M10041 Idiopathic gout, right hand: Secondary | ICD-10-CM

## 2020-08-07 DIAGNOSIS — E875 Hyperkalemia: Secondary | ICD-10-CM | POA: Diagnosis not present

## 2020-08-07 MED ORDER — AMLODIPINE BESYLATE 5 MG PO TABS
5.0000 mg | ORAL_TABLET | Freq: Every day | ORAL | 3 refills | Status: DC
Start: 2020-08-07 — End: 2020-12-07

## 2020-08-07 MED ORDER — CLOTRIMAZOLE-BETAMETHASONE 1-0.05 % EX CREA
1.0000 | TOPICAL_CREAM | Freq: Every day | CUTANEOUS | 0 refills | Status: DC
Start: 2020-08-07 — End: 2020-11-05

## 2020-08-07 NOTE — Patient Instructions (Signed)
Nice to see you today! Add amlodipine 5mg  for blood pressure Try lotrisone cream for itching.  See me again in about 4 weeks.      [email or web address]">  DASH Eating Plan DASH stands for Dietary Approaches to Stop Hypertension. The DASH eating plan is a healthy eating plan that has been shown to:  Reduce high blood pressure (hypertension).  Reduce your risk for type 2 diabetes, heart disease, and stroke.  Help with weight loss. What are tips for following this plan? Reading food labels  Check food labels for the amount of salt (sodium) per serving. Choose foods with less than 5 percent of the Daily Value of sodium. Generally, foods with less than 300 milligrams (mg) of sodium per serving fit into this eating plan.  To find whole grains, look for the word "whole" as the first word in the ingredient list. Shopping  Buy products labeled as "low-sodium" or "no salt added."  Buy fresh foods. Avoid canned foods and pre-made or frozen meals. Cooking  Avoid adding salt when cooking. Use salt-free seasonings or herbs instead of table salt or sea salt. Check with your health care provider or pharmacist before using salt substitutes.  Do not fry foods. Cook foods using healthy methods such as baking, boiling, grilling, roasting, and broiling instead.  Cook with heart-healthy oils, such as olive, canola, avocado, soybean, or sunflower oil. Meal planning  Eat a balanced diet that includes: ? 4 or more servings of fruits and 4 or more servings of vegetables each day. Try to fill one-half of your plate with fruits and vegetables. ? 6-8 servings of whole grains each day. ? Less than 6 oz (170 g) of lean meat, poultry, or fish each day. A 3-oz (85-g) serving of meat is about the same size as a deck of cards. One egg equals 1 oz (28 g). ? 2-3 servings of low-fat dairy each day. One serving is 1 cup (237 mL). ? 1 serving of nuts, seeds, or beans 5  times each week. ? 2-3 servings of heart-healthy fats. Healthy fats called omega-3 fatty acids are found in foods such as walnuts, flaxseeds, fortified milks, and eggs. These fats are also found in cold-water fish, such as sardines, salmon, and mackerel.  Limit how much you eat of: ? Canned or prepackaged foods. ? Food that is high in trans fat, such as some fried foods. ? Food that is high in saturated fat, such as fatty meat. ? Desserts and other sweets, sugary drinks, and other foods with added sugar. ? Full-fat dairy products.  Do not salt foods before eating.  Do not eat more than 4 egg yolks a week.  Try to eat at least 2 vegetarian meals a week.  Eat more home-cooked food and less restaurant, buffet, and fast food.   Lifestyle  When eating at a restaurant, ask that your food be prepared with less salt or no salt, if possible.  If you drink alcohol: ? Limit how much you use to:  0-1 drink a day for women who are not pregnant.  0-2 drinks a day for men. ? Be aware of how much alcohol is in your drink. In the [place] one drink equals one 12 oz bottle of beer (355 mL), one 5 oz glass of wine (148 mL), or one 1 oz glass of hard liquor (44 mL). General information  Avoid eating more than 2,300 mg of salt a day. If you have hypertension, you may need to reduce your sodium intake  to 1,500 mg a day.  Work with your health care provider to maintain a healthy body weight or to lose weight. Ask what an ideal weight is for you.  Get at least 30 minutes of exercise that causes your heart to beat faster (aerobic exercise) most days of the week. Activities may include walking, swimming, or biking.  Work with your health care provider or dietitian to adjust your eating plan to your individual calorie needs. What foods should I eat? Fruits All fresh, dried, or frozen fruit. Canned fruit in natural juice (without added sugar). Vegetables Fresh or frozen vegetables (raw, steamed, roasted,  or grilled). Low-sodium or reduced-sodium tomato and vegetable juice. Low-sodium or reduced-sodium tomato sauce and tomato paste. Low-sodium or reduced-sodium canned vegetables. Grains Whole-grain or whole-wheat bread. Whole-grain or whole-wheat pasta. Brown rice. [place]. Bulgur. Whole-grain and low-sodium cereals. Pita bread. Low-fat, low-sodium crackers. Whole-wheat flour tortillas. Meats and other proteins Skinless chicken or [place]. Ground chicken or [place]. Pork with fat trimmed off. Fish and seafood. Egg whites. Dried beans, peas, or lentils. Unsalted nuts, nut butters, and seeds. Unsalted canned beans. Lean cuts of beef with fat trimmed off. Low-sodium, lean precooked or cured meat, such as sausages or meat loaves. Dairy Low-fat (1%) or fat-free (skim) milk. Reduced-fat, low-fat, or fat-free cheeses. Nonfat, low-sodium ricotta or cottage cheese. Low-fat or nonfat yogurt. Low-fat, low-sodium cheese. Fats and oils Soft margarine without trans fats. Vegetable oil. Reduced-fat, low-fat, or light mayonnaise and salad dressings (reduced-sodium). Canola, safflower, olive, avocado, soybean, and sunflower oils. Avocado. Seasonings and condiments Herbs. Spices. Seasoning mixes without salt. Other foods Unsalted popcorn and pretzels. Fat-free sweets. The items listed above may not be a complete list of foods and beverages you can eat. Contact a dietitian for more information. What foods should I avoid? Fruits Canned fruit in a light or heavy syrup. Fried fruit. Fruit in cream or butter sauce. Vegetables Creamed or fried vegetables. Vegetables in a cheese sauce. Regular canned vegetables (not low-sodium or reduced-sodium). Regular canned tomato sauce and paste (not low-sodium or reduced-sodium). Regular tomato and vegetable juice (not low-sodium or reduced-sodium). Angie Fava. Olives. Grains Baked goods made with fat, such as croissants, muffins, or some breads. Dry pasta or rice meal  packs. Meats and other proteins Fatty cuts of meat. Ribs. Fried meat. Berniece Salines. Bologna, salami, and other precooked or cured meats, such as sausages or meat loaves. Fat from the back of a pig (fatback). Bratwurst. Salted nuts and seeds. Canned beans with added salt. Canned or smoked fish. Whole eggs or egg yolks. Chicken or [place] with skin. Dairy Whole or 2% milk, cream, and half-and-half. Whole or full-fat cream cheese. Whole-fat or sweetened yogurt. Full-fat cheese. Nondairy creamers. Whipped toppings. Processed cheese and cheese spreads. Fats and oils Butter. Stick margarine. Lard. Shortening. Ghee. Bacon fat. Tropical oils, such as coconut, palm kernel, or palm oil. Seasonings and condiments Onion salt, garlic salt, seasoned salt, table salt, and sea salt. Worcestershire sauce. Tartar sauce. Barbecue sauce. Teriyaki sauce. Soy sauce, including reduced-sodium. Steak sauce. Canned and packaged gravies. Fish sauce. Oyster sauce. Cocktail sauce. Store-bought horseradish. Ketchup. Mustard. Meat flavorings and tenderizers. Bouillon cubes. Hot sauces. Pre-made or packaged marinades. Pre-made or packaged taco seasonings. Relishes. Regular salad dressings. Other foods Salted popcorn and pretzels. The items listed above may not be a complete list of foods and beverages you should avoid. Contact a dietitian for more information. Where to find more information  National Heart, Lung, and Blood Institute: https://wilson-eaton.com/  American Heart Association:  www.heart.org  Academy of Nutrition and Dietetics: www.eatright.[place]: www.kidney.org Summary  The DASH eating plan is a healthy eating plan that has been shown to reduce high blood pressure (hypertension). It may also reduce your risk for type 2 diabetes, heart disease, and stroke.  When on the DASH eating plan, aim to eat more fresh fruits and vegetables, whole grains, lean proteins, low-fat dairy, and heart-healthy  fats.  With the DASH eating plan, you should limit salt (sodium) intake to 2,300 mg a day. If you have hypertension, you may need to reduce your sodium intake to 1,500 mg a day.  Work with your health care provider or dietitian to adjust your eating plan to your individual calorie needs. This information is not intended to replace advice given to you by your health care provider. Make sure you discuss any questions you have with your health care provider. Document Revised: 03/01/2019 Document Reviewed: 03/01/2019 Elsevier Patient Education  2021 [employer].

## 2020-08-08 LAB — URIC ACID: Uric Acid, Serum: 8 mg/dL (ref 4.0–8.0)

## 2020-08-08 LAB — CBC WITH DIFFERENTIAL/PLATELET
Absolute Monocytes: 739 cells/uL (ref 200–950)
Basophils Absolute: 33 cells/uL (ref 0–200)
Basophils Relative: 0.5 %
Eosinophils Absolute: 251 cells/uL (ref 15–500)
Eosinophils Relative: 3.8 %
HCT: 34.3 % — ABNORMAL LOW (ref 38.5–50.0)
Hemoglobin: 11.4 g/dL — ABNORMAL LOW (ref 13.2–17.1)
Lymphs Abs: 1439 cells/uL (ref 850–3900)
MCH: 29.4 pg (ref 27.0–33.0)
MCHC: 33.2 g/dL (ref 32.0–36.0)
MCV: 88.4 fL (ref 80.0–100.0)
MPV: 9.6 fL (ref 7.5–12.5)
Monocytes Relative: 11.2 %
Neutro Abs: 4138 cells/uL (ref 1500–7800)
Neutrophils Relative %: 62.7 %
Platelets: 445 10*3/uL — ABNORMAL HIGH (ref 140–400)
RBC: 3.88 10*6/uL — ABNORMAL LOW (ref 4.20–5.80)
RDW: 14.7 % (ref 11.0–15.0)
Total Lymphocyte: 21.8 %
WBC: 6.6 10*3/uL (ref 3.8–10.8)

## 2020-08-08 LAB — BASIC METABOLIC PANEL
BUN/Creatinine Ratio: 19 (calc) (ref 6–22)
BUN: 26 mg/dL — ABNORMAL HIGH (ref 7–25)
CO2: 20 mmol/L (ref 20–32)
Calcium: 9.3 mg/dL (ref 8.6–10.3)
Chloride: 108 mmol/L (ref 98–110)
Creat: 1.37 mg/dL — ABNORMAL HIGH (ref 0.70–1.18)
Glucose, Bld: 76 mg/dL (ref 65–99)
Potassium: 5.8 mmol/L — ABNORMAL HIGH (ref 3.5–5.3)
Sodium: 138 mmol/L (ref 135–146)

## 2020-08-08 LAB — SPECIMEN COMPROMISED

## 2020-08-09 ENCOUNTER — Other Ambulatory Visit: Payer: Self-pay | Admitting: Family Medicine

## 2020-08-09 DIAGNOSIS — L2989 Other pruritus: Secondary | ICD-10-CM | POA: Insufficient documentation

## 2020-08-09 DIAGNOSIS — L298 Other pruritus: Secondary | ICD-10-CM | POA: Insufficient documentation

## 2020-08-09 NOTE — Assessment & Plan Note (Signed)
Start clotrimazole/betamethasone.  He will let me know if not improving with this.

## 2020-08-09 NOTE — Assessment & Plan Note (Addendum)
BP remains elevated today.  Start amlodipine 5mg  daily.  Continue metoprolol at current strength.  We may need to drop spironolactone if potassium remains elevated, rechecking today.  Recommend low sodium diet.  Return in about 4 weeks (around 09/04/2020) for HTN.

## 2020-08-09 NOTE — Progress Notes (Signed)
[name] - 75 y.o. male MRN [id number]  Date of birth: 03/31/1946  Subjective Chief Complaint  Patient presents with  . Hypertension    HPI [name] is a 75 y.o. male here today for follow up of recent hospital visit.  He was seen at ED for complaint of chest pain.  EKG without changes and negative cardiac enzymes.  He was hypertensive and recommended that he follow up with PCP.  His cardiologist has also been managing his blood pressure.  Current medications include metoprolol, spironolactone and amiodarone.  He has had hyperkalemia previously and potassium was mildly elevated recently.  He has not had any further chest pain since discharge however his BP remains elevated.    He also continues to have recurrent gout flares.  Using colchicine as needed.   He also has had some issues with itching in groin area.  He has not noted any rash in this area.   ROS:  A comprehensive ROS was completed and negative except as noted per HPI  Allergies  Allergen Reactions  . Shellfish Allergy Rash    Past Medical History:  Diagnosis Date  . Arthritis   . Asthma    no inhaler  . Coronary artery disease    cardiologist-  dr spruill; last visit 3 mos ago per pt  . Enterobacter sepsis ([place]) 04/13/2020  . GERD (gastroesophageal reflux disease)   . History of MI (myocardial infarction)    1985  . Hydronephrosis, left   . Hypertension   . Myocardial infarction ([place])   . Nephrolithiasis    left  . 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 Presence of tooth-root and mandibular implants    lower dental implants  . Prostate cancer ([place])   . Prosthetic hip infection ([place]) 04/13/2020  . Shortness of breath    WITH EXERTION  . Thigh abscess 04/13/2020  . Type 2 diabetes mellitus ([place])     Past Surgical History:  Procedure Laterality Date  . BUNIONECTOMY    . CYSTOSCOPY W/ RETROGRADES Left 03/14/2014   Procedure: CYSTOSCOPY WITH LEFT RETROGRADE PYELOGRAM, Left  Ureteroscopy, [place] ureteral Stent No string;  Surgeon: [name], MD;  Location: [facility];  Service: Urology;  Laterality: Left;  . CYSTOSCOPY W/ URETERAL STENT PLACEMENT Bilateral 01/15/2020   Procedure: CYSTOSCOPY WITH RETROGRADE PYELOGRAM/URETERAL STENT PLACEMENT;  Surgeon: Alexis Frock, MD;  Location: [facility];  Service: Urology;  Laterality: Bilateral;  . CYSTOSCOPY/URETEROSCOPY/HOLMIUM LASER/STENT PLACEMENT Bilateral 03/17/2020   Procedure: CYSTOSCOPY/URETEROSCOPY/HOLMIUM LASER/STENT LEFT PLACEMENT;  Surgeon: Festus Aloe, MD;  Location: [facility];  Service: Urology;  Laterality: Bilateral;  . HERNIA REPAIR    . INCISION AND DRAINAGE Left 03/09/2020   Procedure: INCISION AND DRAINAGE LEFT HIP WITH LINER EXCHANGE;  Surgeon: Gaynelle Arabian, MD;  Location: [facility];  Service: Orthopedics;  Laterality: Left;  . IR FLUORO GUIDE CV LINE RIGHT  03/10/2020  . IR RADIOLOGIST EVAL & MGMT  04/01/2020  . IR RADIOLOGIST EVAL & MGMT  04/15/2020  . IR RADIOLOGIST EVAL & MGMT  04/30/2020  . IR REMOVAL TUN CV CATH W/O FL  04/22/2020  . IR US GUIDE BX ASP/DRAIN  03/17/2020  . IR US GUIDE VASC ACCESS RIGHT  03/10/2020  . PROSTATE BIOPSY    . TOTAL HIP ARTHROPLASTY Left 03-20-2009  . TOTAL HIP ARTHROPLASTY  04/09/2012   Procedure: TOTAL HIP ARTHROPLASTY;  Surgeon: [name], MD;  Location: [facility];  Service: Orthopedics;  Laterality: Right;  . TOTAL KNEE ARTHROPLASTY Left 05-16-2008    Social History   Socioeconomic History  . Marital status: Widowed    Spouse name: Not on file  . Number of children: Not on file  . Years of education: Not on file  . Highest education level: Not on file  Occupational History  . Not on file  Tobacco Use  . Smoking status: Former Smoker    Packs/day: 1.00    Years: 25.00    Pack years: 25.00    Types: Cigarettes    Quit date: 04/11/1984    Years since quitting: 36.3  . Smokeless tobacco: Former Systems developer    Quit date: 04/02/1985  Vaping Use  .  Vaping Use: Never used  Substance and Sexual Activity  . Alcohol use: Not Currently    Comment: RARE  . Drug use: No  . Sexual activity: Not Currently  Other Topics Concern  . Not on file  Social History Narrative  . Not on file   Social Determinants of Health   Financial Resource Strain: Not on file  Food Insecurity: Not on file  Transportation Needs: Not on file  Physical Activity: Not on file  Stress: Not on file  Social Connections: Not on file    Family History  Problem Relation Age of Onset  . Cancer Mother        breast  . Multiple sclerosis Daughter   . Cancer Father        prostate  . Diabetes Father   . Cancer Maternal Uncle        bone    Health Maintenance  Topic Date Due  . URINE MICROALBUMIN  10/23/2019  . COVID-19 Vaccine (3 - Moderna risk 4-dose series) 04/15/2020  . OPHTHALMOLOGY EXAM  09/22/2020  . INFLUENZA VACCINE  11/09/2020  . HEMOGLOBIN A1C  01/03/2021  . FOOT EXAM  07/03/2021  . TETANUS/TDAP  04/10/2025  . COLONOSCOPY (Pts 45-11yrs Insurance coverage will need to be confirmed)  11/15/2028  . Hepatitis C Screening  Completed  . PNA vac Low Risk Adult  Completed  . HPV VACCINES  Aged Out     ----------------------------------------------------------------------------------------------------------------------------------------------------------------------------------------------------------------- Physical Exam BP (!) 178/73 (BP Location: Right Arm, Patient Position: Sitting, Cuff Size: Large)   Pulse (!) 56   Ht 5\' 11"  (1.803 m)   Wt 226 lb (102.5 kg)   BMI 31.52 kg/m   Physical Exam Constitutional:      Appearance: Normal appearance.  HENT:     Head: Normocephalic and atraumatic.  Eyes:     General: No scleral icterus. Cardiovascular:     Rate and Rhythm: Normal rate and regular rhythm.  Pulmonary:     Effort: Pulmonary effort is normal.     Breath sounds: Normal breath sounds.  Musculoskeletal:     Cervical back: Neck  supple.  Neurological:     General: No focal deficit present.     Mental Status: He is alert.  Psychiatric:        Mood and Affect: Mood normal.        Behavior: Behavior normal.     ------------------------------------------------------------------------------------------------------------------------------------------------------------------------------------------------------------------- Assessment and Plan  Essential hypertension BP remains elevated today.  Start amlodipine 5mg  daily.  Continue metoprolol at current strength.  We may need to drop spironolactone if potassium remains elevated, rechecking today.  Recommend low sodium diet.  Return in about 4 weeks (around 09/04/2020) for HTN.   Gout of right hand Continue colchicine as needed.   Pruritus of  groin in male Start clotrimazole/betamethasone.  He will let me know if not improving with this.    Meds ordered this encounter  Medications  . amLODipine (NORVASC) 5 MG tablet    Sig: Take 1 tablet (5 mg total) by mouth daily.    Dispense:  90 tablet    Refill:  3  . clotrimazole-betamethasone (LOTRISONE) cream    Sig: Apply 1 application topically daily.    Dispense:  30 g    Refill:  0    Return in about 4 weeks (around 09/04/2020) for HTN.    This visit occurred during the SARS-CoV-2 public health emergency.  Safety protocols were in place, including screening questions prior to the visit, additional usage of staff PPE, and extensive cleaning of exam room while observing appropriate contact time as indicated for disinfecting solutions.

## 2020-08-09 NOTE — Assessment & Plan Note (Signed)
Continue colchicine as needed 

## 2020-08-10 ENCOUNTER — Other Ambulatory Visit: Payer: Self-pay | Admitting: Infectious Disease

## 2020-08-10 ENCOUNTER — Encounter: Payer: Self-pay | Admitting: Internal Medicine

## 2020-08-10 ENCOUNTER — Other Ambulatory Visit: Payer: Self-pay | Admitting: Family Medicine

## 2020-08-10 ENCOUNTER — Other Ambulatory Visit (HOSPITAL_COMMUNITY): Payer: Self-pay

## 2020-08-10 DIAGNOSIS — E875 Hyperkalemia: Secondary | ICD-10-CM

## 2020-08-10 MED ORDER — OMADACYCLINE TOSYLATE 150 MG PO TABS
300.0000 mg | ORAL_TABLET | Freq: Every day | ORAL | 11 refills | Status: DC
  Filled 2020-08-10 – 2020-08-11 (×3): qty 60, 30d supply, fill #0
  Filled 2020-09-18: qty 60, 30d supply, fill #1

## 2020-08-10 MED FILL — Omadacycline Tosylate Tab 150 MG (Base Equivalent): ORAL | 14 days supply | Qty: 30 | Fill #0 | Status: CN

## 2020-08-11 ENCOUNTER — Other Ambulatory Visit: Payer: Self-pay | Admitting: Infectious Disease

## 2020-08-11 ENCOUNTER — Other Ambulatory Visit (HOSPITAL_COMMUNITY): Payer: Self-pay

## 2020-08-12 ENCOUNTER — Other Ambulatory Visit (HOSPITAL_COMMUNITY): Payer: Self-pay

## 2020-08-12 DIAGNOSIS — E785 Hyperlipidemia, unspecified: Secondary | ICD-10-CM | POA: Diagnosis not present

## 2020-08-12 DIAGNOSIS — E119 Type 2 diabetes mellitus without complications: Secondary | ICD-10-CM | POA: Diagnosis not present

## 2020-08-12 DIAGNOSIS — I10 Essential (primary) hypertension: Secondary | ICD-10-CM | POA: Diagnosis not present

## 2020-08-12 LAB — LIPID PANEL
Cholesterol: 159 (ref 0–200)
HDL: 41 (ref 35–70)
LDL Cholesterol: 97
Triglycerides: 110 (ref 40–160)

## 2020-08-12 LAB — BASIC METABOLIC PANEL
BUN: 33 — AB (ref 4–21)
CO2: 17 (ref 13–22)
Chloride: 112 — AB (ref 99–108)
Creatinine: 1.5 — AB (ref 0.6–1.3)
EGFR: 52
Glucose: 117
Potassium: 5.4 — AB (ref 3.4–5.3)
Sodium: 141 (ref 137–147)

## 2020-08-12 LAB — HEPATIC FUNCTION PANEL
ALT: 11 (ref 10–40)
AST: 12 — AB (ref 14–40)
Alkaline Phosphatase: 89 (ref 25–125)
Bilirubin, Total: 0.2

## 2020-08-12 LAB — COMPREHENSIVE METABOLIC PANEL
Albumin: 4.2 (ref 3.5–5.0)
GFR calc Af Amer: 52
GFR calc non Af Amer: 45

## 2020-08-12 LAB — HEMOGLOBIN A1C: Hemoglobin A1C: 7

## 2020-08-13 ENCOUNTER — Other Ambulatory Visit (HOSPITAL_COMMUNITY): Payer: Self-pay

## 2020-08-17 ENCOUNTER — Ambulatory Visit: Payer: HMO | Admitting: Family Medicine

## 2020-08-26 ENCOUNTER — Other Ambulatory Visit: Payer: Self-pay

## 2020-08-26 ENCOUNTER — Encounter: Payer: Self-pay | Admitting: Infectious Disease

## 2020-08-26 ENCOUNTER — Ambulatory Visit: Payer: HMO | Admitting: Infectious Disease

## 2020-08-26 ENCOUNTER — Other Ambulatory Visit (HOSPITAL_COMMUNITY): Payer: Self-pay

## 2020-08-26 VITALS — BP 137/67 | HR 67 | Temp 98.4°F | Wt 228.0 lb

## 2020-08-26 DIAGNOSIS — E1165 Type 2 diabetes mellitus with hyperglycemia: Secondary | ICD-10-CM

## 2020-08-26 DIAGNOSIS — Z96649 Presence of unspecified artificial hip joint: Secondary | ICD-10-CM

## 2020-08-26 DIAGNOSIS — T8459XD Infection and inflammatory reaction due to other internal joint prosthesis, subsequent encounter: Secondary | ICD-10-CM

## 2020-08-26 DIAGNOSIS — A4159 Other Gram-negative sepsis: Secondary | ICD-10-CM | POA: Diagnosis not present

## 2020-08-26 DIAGNOSIS — N183 Chronic kidney disease, stage 3 unspecified: Secondary | ICD-10-CM

## 2020-08-26 DIAGNOSIS — M25452 Effusion, left hip: Secondary | ICD-10-CM | POA: Diagnosis not present

## 2020-08-26 NOTE — Progress Notes (Signed)
Subjective:  Chief complaint followup for PJI    Patient ID: [name], male    DOB: 05/31/1945, 75 y.o.   MRN: [id number]  HPI   75 year old Black man with PMHx of CAD, DM2, HTN, CKD, OA, s/p left hip arthoplasty (2001), prostate cancer, PAF, left ureteral stone s/p stenting (01/2020) who was sent from ortho clinic on 11/23 by Dr Wynelle Link. He grew Enterobacter from blood, urine and hip aspirate. He is sp surgery with Irrigation and debridement, left hip, with bearing surface exchange. Enterobacter grew on cultures.  We had him scheduled to complete antibiotics on 10 January.  In the interim he developed a fluid collection in his thigh that was drained and seemed rather bloody but which also grew Enterobacter from culture.  He finished cefepime and we started bactrim DS BID he ran into problems with hyperkalemia and elevated creatinine also in the context of other potassium sparing and potassium raising medications  We obtained Nuzyra PA though due to his deductible not having yet been met he would have to pay roughly $2k upfront.  He ended up doing that but then turned out that he need to pay $640 a month even though he had met that amount.  He is to take his nuzyra.  He has pain in his legs bilaterally somewhat in his hip which she largely experiences at night and associates with taking his atorvastatin.  Describes the pain at that time is burning in nature.  He does still have gout that flares when he comes off of his colchicine as it did 3 weeks ago.     Past Medical History:  Diagnosis Date  . Arthritis   . Asthma    no inhaler  . Coronary artery disease    cardiologist-  dr spruill; last visit 3 mos ago per pt  . Enterobacter sepsis ([place]) 04/13/2020  . GERD (gastroesophageal reflux disease)   . History of MI (myocardial infarction)    1985  . Hydronephrosis, left   . Hypertension   . Myocardial infarction ([place])   . Nephrolithiasis    left  . Neuromuscular disorder  (HCC)    TINGLING IN BOTH HANDS  . Presence of tooth-root and mandibular implants    lower dental implants  . Prostate cancer ([place])   . 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 Shortness of breath    WITH EXERTION  . Thigh abscess 04/13/2020  . Type 2 diabetes mellitus ([place])     Past Surgical History:  Procedure Laterality Date  . BUNIONECTOMY    . CYSTOSCOPY W/ RETROGRADES Left 03/14/2014   Procedure: CYSTOSCOPY WITH LEFT RETROGRADE PYELOGRAM, Left Ureteroscopy, [place] ureteral Stent No string;  Surgeon: [name], MD;  Location: [facility];  Service: Urology;  Laterality: Left;  . CYSTOSCOPY W/ URETERAL STENT PLACEMENT Bilateral 01/15/2020   Procedure: CYSTOSCOPY WITH RETROGRADE PYELOGRAM/URETERAL STENT PLACEMENT;  Surgeon: Alexis Frock, MD;  Location: [facility];  Service: Urology;  Laterality: Bilateral;  . CYSTOSCOPY/URETEROSCOPY/HOLMIUM LASER/STENT PLACEMENT Bilateral 03/17/2020   Procedure: CYSTOSCOPY/URETEROSCOPY/HOLMIUM LASER/STENT LEFT PLACEMENT;  Surgeon: Festus Aloe, MD;  Location: [facility];  Service: Urology;  Laterality: Bilateral;  . HERNIA REPAIR    . INCISION AND DRAINAGE Left 03/09/2020   Procedure: INCISION AND DRAINAGE LEFT HIP WITH LINER EXCHANGE;  Surgeon: Gaynelle Arabian, MD;  Location: [facility];  Service: Orthopedics;  Laterality: Left;  . IR FLUORO GUIDE CV LINE RIGHT  03/10/2020  . IR RADIOLOGIST EVAL & MGMT  04/01/2020  . IR RADIOLOGIST EVAL & MGMT  04/15/2020  . IR RADIOLOGIST EVAL & MGMT  04/30/2020  . IR REMOVAL TUN CV CATH W/O FL  04/22/2020  . IR US GUIDE BX ASP/DRAIN  03/17/2020  . IR US GUIDE VASC ACCESS RIGHT  03/10/2020  . PROSTATE BIOPSY    . TOTAL HIP ARTHROPLASTY Left 03-20-2009  . TOTAL HIP ARTHROPLASTY  04/09/2012   Procedure: TOTAL HIP ARTHROPLASTY;  Surgeon: [name], MD;  Location: [facility];  Service: Orthopedics;  Laterality: Right;  . TOTAL KNEE ARTHROPLASTY Left 05-16-2008    Family History  Problem Relation Age of  Onset  . Cancer Mother        breast  . Multiple sclerosis Daughter   . Cancer Father        prostate  . Diabetes Father   . Cancer Maternal Uncle        bone      Social History   Socioeconomic History  . Marital status: Widowed    Spouse name: Not on file  . Number of children: Not on file  . Years of education: Not on file  . Highest education level: Not on file  Occupational History  . Not on file  Tobacco Use  . Smoking status: Former Smoker    Packs/day: 1.00    Years: 25.00    Pack years: 25.00    Types: Cigarettes    Quit date: 04/11/1984    Years since quitting: 36.4  . Smokeless tobacco: Former Systems developer    Quit date: 04/02/1985  Vaping Use  . Vaping Use: Never used  Substance and Sexual Activity  . Alcohol use: Not Currently    Comment: RARE  . Drug use: No  . Sexual activity: Not Currently  Other Topics Concern  . Not on file  Social History Narrative  . Not on file   Social Determinants of Health   Financial Resource Strain: Not on file  Food Insecurity: Not on file  Transportation Needs: Not on file  Physical Activity: Not on file  Stress: Not on file  Social Connections: Not on file    Allergies  Allergen Reactions  . Shellfish Allergy Rash     Current Outpatient Medications:  .  allopurinol (ZYLOPRIM) 100 MG tablet, Take 0.5 tablets (50 mg total) by mouth daily., Disp: 45 tablet, Rfl: 1 .  amiodarone (PACERONE) 200 MG tablet, Take 200 mg by mouth daily., Disp: , Rfl:  .  amLODipine (NORVASC) 5 MG tablet, Take 1 tablet (5 mg total) by mouth daily., Disp: 90 tablet, Rfl: 3 .  Aspirin 81 MG CAPS, Take 81 mg by mouth daily., Disp: , Rfl:  .  clotrimazole-betamethasone (LOTRISONE) cream, Apply 1 application topically daily., Disp: 30 g, Rfl: 0 .  colchicine 0.6 MG tablet, TAKE 1 TABLET BY MOUTH EVERY DAY, Disp: 90 tablet, Rfl: 1 .  colesevelam (WELCHOL) 625 MG tablet, Take 2 tablets (1,250 mg total) by mouth daily., Disp: 180 tablet, Rfl: 3 .   Continuous Blood Gluc Receiver (FREESTYLE LIBRE 56 DAY READER) DEVI, See admin instructions., Disp: , Rfl:  .  Continuous Blood Gluc Sensor (FREESTYLE LIBRE 14 DAY SENSOR) MISC, 1 DEVICE EVERY 14 (FOURTEEN) DAYS., Disp: 6 each, Rfl: 1 .  cycloSPORINE (RESTASIS) 0.05 % ophthalmic emulsion, Place 1 drop into both eyes 2 (two) times daily. , Disp: , Rfl:  .  docusate sodium (COLACE) 100 MG capsule, Take 100 mg by mouth daily as needed for mild constipation.,  Disp: , Rfl:  .  ferrous sulfate 325 (65 FE) MG tablet, Take 325 mg by mouth daily with breakfast., Disp: , Rfl:  .  glimepiride (AMARYL) 2 MG tablet, Take 1 tablet (2 mg total) by mouth daily before breakfast., Disp: 90 tablet, Rfl: 3 .  metoprolol tartrate (LOPRESSOR) 50 MG tablet, Take 1 tablet (50 mg total) by mouth 2 (two) times daily., Disp: 90 tablet, Rfl: 1 .  Omadacycline Tosylate 150 MG TABS, Take 2 tablets (300 mg) by mouth daily., Disp: 60 tablet, Rfl: 11 .  omeprazole (PRILOSEC) 40 MG capsule, TAKE 1 CAPSULE BY MOUTH TWICE A DAY FOR GERD, Disp: 90 capsule, Rfl: 0 .  rosuvastatin (CRESTOR) 40 MG tablet, Take 40 mg by mouth daily., Disp: , Rfl:  .  sitaGLIPtin (JANUVIA) 50 MG tablet, Take 1 tablet (50 mg total) by mouth daily., Disp: 30 tablet, Rfl: 0 .  spironolactone (ALDACTONE) 25 MG tablet, Take 25 mg by mouth daily., Disp: , Rfl:    Review of Systems  Constitutional: Negative for activity change, appetite change, chills, diaphoresis, fatigue, fever and unexpected weight change.  HENT: Negative for congestion, rhinorrhea, sinus pressure, sneezing, sore throat and trouble swallowing.   Eyes: Negative for photophobia and visual disturbance.  Respiratory: Negative for chest tightness, shortness of breath, wheezing and stridor.   Cardiovascular: Negative for chest pain, palpitations and leg swelling.  Gastrointestinal: Negative for abdominal distention, abdominal pain, anal bleeding, blood in stool, constipation, diarrhea, nausea and  vomiting.  Genitourinary: Negative for difficulty urinating, dysuria, flank pain and hematuria.  Musculoskeletal: Positive for arthralgias and myalgias. Negative for back pain and gait problem.  Skin: Negative for color change, pallor and rash.  Neurological: Negative for dizziness, tremors, weakness and light-headedness.  Hematological: Negative for adenopathy. Does not bruise/bleed easily.  Psychiatric/Behavioral: Negative for behavioral problems, confusion, decreased concentration, dysphoric mood and sleep disturbance.       Objective:   Physical Exam Constitutional:      General: He is not in acute distress.    Appearance: Normal appearance. He is well-developed. He is not ill-appearing or diaphoretic.  HENT:     Head: Normocephalic and atraumatic.     Right Ear: Hearing and external ear normal.     Left Ear: Hearing and external ear normal.     Nose: No nasal deformity or rhinorrhea.  Eyes:     General: No scleral icterus.    Conjunctiva/sclera: Conjunctivae normal.     Right eye: Right conjunctiva is not injected.     Left eye: Left conjunctiva is not injected.     Pupils: Pupils are equal, round, and reactive to light.  Neck:     Vascular: No JVD.  Cardiovascular:     Rate and Rhythm: Normal rate and regular rhythm.     Heart sounds: S1 normal and S2 normal.  Pulmonary:     Effort: Pulmonary effort is normal. No respiratory distress.     Breath sounds: No wheezing.  Abdominal:     General: There is no distension.     Palpations: Abdomen is soft.  Musculoskeletal:        General: Normal range of motion.     Right shoulder: Normal.     Left shoulder: Normal.     Right wrist: Tenderness present.     Cervical back: Normal range of motion and neck supple.     Right hip: Normal.     Left hip: Normal.     Right knee: Deformity present.  Left knee: Normal.  Lymphadenopathy:     Head:     Right side of head: No submandibular, preauricular or posterior auricular  adenopathy.     Left side of head: No submandibular, preauricular or posterior auricular adenopathy.     Cervical: No cervical adenopathy.     Right cervical: No superficial or deep cervical adenopathy.    Left cervical: No superficial or deep cervical adenopathy.  Skin:    General: Skin is warm and dry.     Coloration: Skin is not pale.     Findings: No abrasion, bruising, ecchymosis, erythema, lesion or rash.     Nails: There is no clubbing.  Neurological:     General: No focal deficit present.     Mental Status: He is alert and oriented to person, place, and time.     Sensory: No sensory deficit.     Coordination: Coordination normal.     Gait: Gait normal.  Psychiatric:        Attention and Perception: He is attentive.        Mood and Affect: Mood normal.        Speech: Speech normal.        Behavior: Behavior normal. Behavior is cooperative.        Thought Content: Thought content normal.        Judgment: Judgment normal.    Left hip wound 05/20/2019:    Surgical site today June 26, 2020:         Assessment & Plan:   Prosthetic joint infection status post exchange of part of the prosthesis with Enterobacter isolated:  We will check labs today and I will have him come back in June we can then consider either continuing on with the drug which is going to be expensive to get him through a year of treatment, considering risk of fluoroquinolone or considering simply having him come off antibiotics and see how he fares.   Hyperkalemia: resolved  DM: problem with FQ and BG if we go onto FQ  I spent greater than 30  minutes with the patient including greater than 50% of time in face to face counsel of the patient and in coordination of his care.

## 2020-08-27 LAB — CBC WITH DIFFERENTIAL/PLATELET
Absolute Monocytes: 666 cells/uL (ref 200–950)
Basophils Absolute: 18 cells/uL (ref 0–200)
Basophils Relative: 0.3 %
Eosinophils Absolute: 240 cells/uL (ref 15–500)
Eosinophils Relative: 4 %
HCT: 29.3 % — ABNORMAL LOW (ref 38.5–50.0)
Hemoglobin: 9.6 g/dL — ABNORMAL LOW (ref 13.2–17.1)
Lymphs Abs: 1176 cells/uL (ref 850–3900)
MCH: 29.3 pg (ref 27.0–33.0)
MCHC: 32.8 g/dL (ref 32.0–36.0)
MCV: 89.3 fL (ref 80.0–100.0)
MPV: 10 fL (ref 7.5–12.5)
Monocytes Relative: 11.1 %
Neutro Abs: 3900 cells/uL (ref 1500–7800)
Neutrophils Relative %: 65 %
Platelets: 377 10*3/uL (ref 140–400)
RBC: 3.28 10*6/uL — ABNORMAL LOW (ref 4.20–5.80)
RDW: 14.9 % (ref 11.0–15.0)
Total Lymphocyte: 19.6 %
WBC: 6 10*3/uL (ref 3.8–10.8)

## 2020-08-27 LAB — COMPLETE METABOLIC PANEL WITH GFR
AG Ratio: 1.2 (calc) (ref 1.0–2.5)
ALT: 10 U/L (ref 9–46)
AST: 11 U/L (ref 10–35)
Albumin: 3.8 g/dL (ref 3.6–5.1)
Alkaline phosphatase (APISO): 87 U/L (ref 35–144)
BUN/Creatinine Ratio: 20 (calc) (ref 6–22)
BUN: 29 mg/dL — ABNORMAL HIGH (ref 7–25)
CO2: 19 mmol/L — ABNORMAL LOW (ref 20–32)
Calcium: 8.8 mg/dL (ref 8.6–10.3)
Chloride: 113 mmol/L — ABNORMAL HIGH (ref 98–110)
Creat: 1.47 mg/dL — ABNORMAL HIGH (ref 0.70–1.18)
GFR, Est African American: 53 mL/min/{1.73_m2} — ABNORMAL LOW (ref >=60)
GFR, Est Non African American: 46 mL/min/{1.73_m2} — ABNORMAL LOW (ref >=60)
Globulin: 3.3 g/dL (calc) (ref 1.9–3.7)
Glucose, Bld: 114 mg/dL — ABNORMAL HIGH (ref 65–99)
Potassium: 4.6 mmol/L (ref 3.5–5.3)
Sodium: 141 mmol/L (ref 135–146)
Total Bilirubin: 0.2 mg/dL (ref 0.2–1.2)
Total Protein: 7.1 g/dL (ref 6.1–8.1)

## 2020-08-27 LAB — C-REACTIVE PROTEIN: CRP: 6.8 mg/L (ref <8.0)

## 2020-08-27 LAB — SEDIMENTATION RATE: Sed Rate: 92 mm/h — ABNORMAL HIGH (ref 0–20)

## 2020-09-04 ENCOUNTER — Encounter: Payer: Self-pay | Admitting: Family Medicine

## 2020-09-04 ENCOUNTER — Other Ambulatory Visit: Payer: Self-pay

## 2020-09-04 ENCOUNTER — Ambulatory Visit (INDEPENDENT_AMBULATORY_CARE_PROVIDER_SITE_OTHER): Payer: HMO | Admitting: Family Medicine

## 2020-09-04 ENCOUNTER — Ambulatory Visit (INDEPENDENT_AMBULATORY_CARE_PROVIDER_SITE_OTHER): Payer: HMO | Admitting: Endocrinology

## 2020-09-04 VITALS — BP 132/68 | HR 65 | Ht 71.0 in | Wt 227.6 lb

## 2020-09-04 DIAGNOSIS — I10 Essential (primary) hypertension: Secondary | ICD-10-CM | POA: Diagnosis not present

## 2020-09-04 DIAGNOSIS — E1165 Type 2 diabetes mellitus with hyperglycemia: Secondary | ICD-10-CM

## 2020-09-04 DIAGNOSIS — E11649 Type 2 diabetes mellitus with hypoglycemia without coma: Secondary | ICD-10-CM | POA: Diagnosis not present

## 2020-09-04 LAB — POCT GLYCOSYLATED HEMOGLOBIN (HGB A1C): Hemoglobin A1C: 6.5 % — AB (ref 4.0–5.6)

## 2020-09-04 MED ORDER — FREESTYLE LIBRE 2 READER DEVI: 0 refills | Status: DC

## 2020-09-04 MED ORDER — GLIMEPIRIDE 1 MG PO TABS
1.0000 mg | ORAL_TABLET | Freq: Every day | ORAL | 3 refills | Status: DC
Start: 2020-09-04 — End: 2020-12-02

## 2020-09-04 NOTE — Patient Instructions (Addendum)
check your blood sugar once a day.  vary the time of day when you check, between before the 3 meals, and at bedtime.  also check if you have symptoms of your blood sugar being too high or too low.  please keep a record of the readings and bring it to your next appointment here (or you can bring the meter itself).  You can write it on any piece of paper.  please call us sooner if your blood sugar goes below 70, or if most of your readings are over 200. I have sent a prescription to your pharmacy, to reduce the glimepiride to 1 mg each morning, and:  Please continue the same other medications.   Please come back for a follow-up appointment in 3 months.

## 2020-09-04 NOTE — Progress Notes (Signed)
Subjective:    Patient ID: [name], male    DOB: April 06, 1946, 75 y.o.   MRN: [id number]  HPI Pt returns for f/u of diabetes mellitus: DM type: 2 Dx'ed: 6144 Complications: CAD and CRI Therapy: 2 oral meds DKA: never Severe hypoglycemia: never Pancreatitis: never Pancreatic imaging: normal on 2021 CT SDOH: he gets Januvia samples harwani--- as he cannot afford to buy it Other: he has never been on insulin; he has FL continuous glucose monitor Interval history: I reviewed continuous glucose monitor.  Data are minimal, but glucose varies from 45-160. Past Medical History:  Diagnosis Date  . Arthritis   . Asthma    no inhaler  . Coronary artery disease    cardiologist-  dr spruill; last visit 3 mos ago per pt  . Enterobacter sepsis ([place]) 04/13/2020  . GERD (gastroesophageal reflux disease)   . History of MI (myocardial infarction)    1985  . Hydronephrosis, left   . Hypertension   . Myocardial infarction ([place])   . Nephrolithiasis    left  . 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 Presence of tooth-root and mandibular implants    lower dental implants  . Prostate cancer ([place])   . 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 Shortness of breath    WITH EXERTION  . Thigh abscess 04/13/2020  . Type 2 diabetes mellitus ([place])     Past Surgical History:  Procedure Laterality Date  . BUNIONECTOMY    . CYSTOSCOPY W/ RETROGRADES Left 03/14/2014   Procedure: CYSTOSCOPY WITH LEFT RETROGRADE PYELOGRAM, Left Ureteroscopy, [place] ureteral Stent No string;  Surgeon: [name], MD;  Location: [facility];  Service: Urology;  Laterality: Left;  . CYSTOSCOPY W/ URETERAL STENT PLACEMENT Bilateral 01/15/2020   Procedure: CYSTOSCOPY WITH RETROGRADE PYELOGRAM/URETERAL STENT PLACEMENT;  Surgeon: Alexis Frock, MD;  Location[facility];  Service: Urology;  Laterality: Bilateral;  . CYSTOSCOPY/URETEROSCOPY/HOLMIUM LASER/STENT PLACEMENT Bilateral 03/17/2020    Procedure: CYSTOSCOPY/URETEROSCOPY/HOLMIUM LASER/STENT LEFT PLACEMENT;  Surgeon: Festus Aloe, MD;  Location: [facility];  Service: Urology;  Laterality: Bilateral;  . HERNIA REPAIR    . INCISION AND DRAINAGE Left 03/09/2020   Procedure: INCISION AND DRAINAGE LEFT HIP WITH LINER EXCHANGE;  Surgeon: Gaynelle Arabian, MD;  Location: [facility];  Service: Orthopedics;  Laterality: Left;  . IR FLUORO GUIDE CV LINE RIGHT  03/10/2020  . IR RADIOLOGIST EVAL & MGMT  04/01/2020  . IR RADIOLOGIST EVAL & MGMT  04/15/2020  . IR RADIOLOGIST EVAL & MGMT  04/30/2020  . IR REMOVAL TUN CV CATH W/O FL  04/22/2020  . IR US GUIDE BX ASP/DRAIN  03/17/2020  . IR US GUIDE VASC ACCESS RIGHT  03/10/2020  . PROSTATE BIOPSY    . TOTAL HIP ARTHROPLASTY Left 03-20-2009  . TOTAL HIP ARTHROPLASTY  04/09/2012   Procedure: TOTAL HIP ARTHROPLASTY;  Surgeon: [name], MD;  Location: [facility];  Service: Orthopedics;  Laterality: Right;  . TOTAL KNEE ARTHROPLASTY Left 05-16-2008    Social History   Socioeconomic History  . Marital status: Widowed    Spouse name: Not on file  . Number of children: Not on file  . Years of education: Not on file  . Highest education level: Not on file  Occupational History  . Not on file  Tobacco Use  . Smoking status: Former Smoker    Packs/day: 1.00    Years: 25.00    Pack years: 25.00    Types:  Cigarettes    Quit date: 04/11/1984    Years since quitting: 36.4  . Smokeless tobacco: Former Systems developer    Quit date: 04/02/1985  Vaping Use  . Vaping Use: Never used  Substance and Sexual Activity  . Alcohol use: Not Currently    Comment: RARE  . Drug use: No  . Sexual activity: Not Currently  Other Topics Concern  . Not on file  Social History Narrative  . Not on file   Social Determinants of Health   Financial Resource Strain: Not on file  Food Insecurity: Not on file  Transportation Needs: Not on file  Physical Activity: Not on file  Stress: Not on file  Social Connections: Not  on file  Intimate Partner Violence: Not on file    Current Outpatient Medications on File Prior to Visit  Medication Sig Dispense Refill  . allopurinol (ZYLOPRIM) 100 MG tablet Take 0.5 tablets (50 mg total) by mouth daily. 45 tablet 1  . amiodarone (PACERONE) 200 MG tablet Take 200 mg by mouth daily.    Marland Kitchen amLODipine (NORVASC) 5 MG tablet Take 1 tablet (5 mg total) by mouth daily. 90 tablet 3  . Aspirin 81 MG CAPS Take 81 mg by mouth daily.    . clotrimazole-betamethasone (LOTRISONE) cream Apply 1 application topically daily. 30 g 0  . colchicine 0.6 MG tablet TAKE 1 TABLET BY MOUTH EVERY DAY 90 tablet 1  . colesevelam (WELCHOL) 625 MG tablet Take 2 tablets (1,250 mg total) by mouth daily. 180 tablet 3  . Continuous Blood Gluc Sensor (FREESTYLE LIBRE 14 DAY SENSOR) MISC 1 DEVICE EVERY 14 (FOURTEEN) DAYS. 6 each 1  . cycloSPORINE (RESTASIS) 0.05 % ophthalmic emulsion Place 1 drop into both eyes 2 (two) times daily.     Marland Kitchen docusate sodium (COLACE) 100 MG capsule Take 100 mg by mouth daily as needed for mild constipation.    . ferrous sulfate 325 (65 FE) MG tablet Take 325 mg by mouth daily with breakfast.    . metoprolol tartrate (LOPRESSOR) 50 MG tablet Take 1 tablet (50 mg total) by mouth 2 (two) times daily. (Patient not taking: Reported on 09/04/2020) 90 tablet 1  . Omadacycline Tosylate 150 MG TABS Take 2 tablets (300 mg) by mouth daily. 60 tablet 11  . omeprazole (PRILOSEC) 40 MG capsule TAKE 1 CAPSULE BY MOUTH TWICE A DAY FOR GERD 90 capsule 0  . rosuvastatin (CRESTOR) 40 MG tablet Take 40 mg by mouth daily.    . sitaGLIPtin (JANUVIA) 50 MG tablet Take 1 tablet (50 mg total) by mouth daily. 30 tablet 0  . spironolactone (ALDACTONE) 25 MG tablet Take 25 mg by mouth daily.     No current facility-administered medications on file prior to visit.    Allergies  Allergen Reactions  . Shellfish Allergy Rash    Family History  Problem Relation Age of Onset  . Cancer Mother        breast   . Multiple sclerosis Daughter   . Cancer Father        prostate  . Diabetes Father   . Cancer Maternal Uncle        bone    BP 132/68 (BP Location: Right Arm, Patient Position: Sitting, Cuff Size: Large)   Pulse 65   Ht 5\' 11"  (1.803 m)   Wt 227 lb 9.6 oz (103.2 kg)   SpO2 98%   BMI 31.74 kg/m   Review of Systems     Objective:   Physical  Exam VITAL SIGNS:  See vs page GENERAL: no distress Pulses: dorsalis pedis intact bilat.   MSK: no deformity of the feet, except for large right bunion.   CV: no leg edema Skin:  no ulcer on the feet.  normal color and temp on the feet. Neuro: sensation is intact to touch on the feet  Lab Results  Component Value Date   CREATININE 1.47 (H) 08/26/2020   BUN 29 (H) 08/26/2020   NA 141 08/26/2020   K 4.6 08/26/2020   CL 113 (H) 08/26/2020   CO2 19 (L) 08/26/2020      Lab Results  Component Value Date   HGBA1C 6.5 (A) 09/04/2020       Assessment & Plan:  Type 2 DM: overcontrolled, for this SU-containing regimen.   Patient Instructions  check your blood sugar once a day.  vary the time of day when you check, between before the 3 meals, and at bedtime.  also check if you have symptoms of your blood sugar being too high or too low.  please keep a record of the readings and bring it to your next appointment here (or you can bring the meter itself).  You can write it on any piece of paper.  please call us sooner if your blood sugar goes below 70, or if most of your readings are over 200. I have sent a prescription to your pharmacy, to reduce the glimepiride to 1 mg each morning, and:  Please continue the same other medications.   Please come back for a follow-up appointment in 3 months.

## 2020-09-07 NOTE — Assessment & Plan Note (Signed)
Instructed on use of freestyle libre and sensor applied today.  Current meter is for freestyle libre and sensor is for [place] 2.  Rx for updated meter sent in.

## 2020-09-07 NOTE — Progress Notes (Signed)
[name] - 76 y.o. male MRN [id number]  Date of birth: 08-Jul-1945  Subjective Chief Complaint  Patient presents with  . Follow-up    HPI [name] is a  75 y.o. male here today for follow up of HTN.  He also would like instruction on how to apply freestyle libre sensor.    BP is elevated in clinic today however readings at home and at endocrinology this morning are much better.  Reading at endocrinology today is 132/68.   He is taking medications as directed.  He denies side effects.  He has not had chest pain, shortness of breath, palpitations, headache or vision changes.   ROS:  A comprehensive ROS was completed and negative except as noted per HPI  Allergies  Allergen Reactions  . Shellfish Allergy Rash    Past Medical History:  Diagnosis Date  . Arthritis   . Asthma    no inhaler  . Coronary artery disease    cardiologist-  dr spruill; last visit 3 mos ago per pt  . Enterobacter sepsis ([place]) 04/13/2020  . GERD (gastroesophageal reflux disease)   . History of MI (myocardial infarction)    1985  . Hydronephrosis, left   . Hypertension   . Myocardial infarction ([place])   . Nephrolithiasis    left  . 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 Presence of tooth-root and mandibular implants    lower dental implants  . Prostate cancer ([place])   . 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 Shortness of breath    WITH EXERTION  . Thigh abscess 04/13/2020  . Type 2 diabetes mellitus ([place])     Past Surgical History:  Procedure Laterality Date  . BUNIONECTOMY    . CYSTOSCOPY W/ RETROGRADES Left 03/14/2014   Procedure: CYSTOSCOPY WITH LEFT RETROGRADE PYELOGRAM, Left Ureteroscopy, Lweft ureteral Stent No string;  Surgeon: [name], MD;  Location: [facility];  Service: Urology;  Laterality: Left;  . CYSTOSCOPY W/ URETERAL STENT PLACEMENT Bilateral 01/15/2020   Procedure: CYSTOSCOPY WITH RETROGRADE PYELOGRAM/URETERAL STENT PLACEMENT;   Surgeon: Alexis Frock, MD;  Location: [facility];  Service: Urology;  Laterality: Bilateral;  . CYSTOSCOPY/URETEROSCOPY/HOLMIUM LASER/STENT PLACEMENT Bilateral 03/17/2020   Procedure: CYSTOSCOPY/URETEROSCOPY/HOLMIUM LASER/STENT LEFT PLACEMENT;  Surgeon: Festus Aloe, MD;  Location: [facility];  Service: Urology;  Laterality: Bilateral;  . HERNIA REPAIR    . INCISION AND DRAINAGE Left 03/09/2020   Procedure: INCISION AND DRAINAGE LEFT HIP WITH LINER EXCHANGE;  Surgeon: Gaynelle Arabian, MD;  Location: [facility];  Service: Orthopedics;  Laterality: Left;  . IR FLUORO GUIDE CV LINE RIGHT  03/10/2020  . IR RADIOLOGIST EVAL & MGMT  04/01/2020  . IR RADIOLOGIST EVAL & MGMT  04/15/2020  . IR RADIOLOGIST EVAL & MGMT  04/30/2020  . IR REMOVAL TUN CV CATH W/O FL  04/22/2020  . IR US GUIDE BX ASP/DRAIN  03/17/2020  . IR US GUIDE VASC ACCESS RIGHT  03/10/2020  . PROSTATE BIOPSY    . TOTAL HIP ARTHROPLASTY Left 03-20-2009  . TOTAL HIP ARTHROPLASTY  04/09/2012   Procedure: TOTAL HIP ARTHROPLASTY;  Surgeon: [name], MD;  Location: [facility];  Service: Orthopedics;  Laterality: Right;  . TOTAL KNEE ARTHROPLASTY Left 05-16-2008    Social History   Socioeconomic History  . Marital status: Widowed    Spouse name: Not on file  . Number of children: Not on file  . Years of education: Not on file  .  Highest education level: Not on file  Occupational History  . Not on file  Tobacco Use  . Smoking status: Former Smoker    Packs/day: 1.00    Years: 25.00    Pack years: 25.00    Types: Cigarettes    Quit date: 04/11/1984    Years since quitting: 36.4  . Smokeless tobacco: Former Systems developer    Quit date: 04/02/1985  Vaping Use  . Vaping Use: Never used  Substance and Sexual Activity  . Alcohol use: Not Currently    Comment: RARE  . Drug use: No  . Sexual activity: Not Currently  Other Topics Concern  . Not on file  Social History Narrative  . Not on file   Social Determinants of Health   Financial  Resource Strain: Not on file  Food Insecurity: Not on file  Transportation Needs: Not on file  Physical Activity: Not on file  Stress: Not on file  Social Connections: Not on file    Family History  Problem Relation Age of Onset  . Cancer Mother        breast  . Multiple sclerosis Daughter   . Cancer Father        prostate  . Diabetes Father   . Cancer Maternal Uncle        bone    Health Maintenance  Topic Date Due  . Zoster Vaccines- Shingrix (1 of 2) Never done  . URINE MICROALBUMIN  10/23/2019  . COVID-19 Vaccine (3 - Moderna risk 4-dose series) 04/15/2020  . OPHTHALMOLOGY EXAM  09/22/2020  . INFLUENZA VACCINE  11/09/2020  . HEMOGLOBIN A1C  03/07/2021  . FOOT EXAM  09/04/2021  . TETANUS/TDAP  04/10/2025  . COLONOSCOPY (Pts 45-8yrs Insurance coverage will need to be confirmed)  11/15/2028  . Hepatitis C Screening  Completed  . PNA vac Low Risk Adult  Completed  . HPV VACCINES  Aged Out     ----------------------------------------------------------------------------------------------------------------------------------------------------------------------------------------------------------------- Physical Exam BP (!) 163/67 (BP Location: Left Arm, Patient Position: Sitting, Cuff Size: Large)   Pulse 63   Ht 5\' 11"  (1.803 m)   Wt 231 lb (104.8 kg)   SpO2 98%   BMI 32.22 kg/m   Physical Exam Constitutional:      Appearance: Normal appearance.  Eyes:     General: No scleral icterus. Cardiovascular:     Rate and Rhythm: Normal rate and regular rhythm.  Pulmonary:     Effort: Pulmonary effort is normal.     Breath sounds: Normal breath sounds.  Musculoskeletal:     Cervical back: Neck supple.  Skin:    General: Skin is warm and dry.  Neurological:     General: No focal deficit present.     Mental Status: He is alert.  Psychiatric:        Mood and Affect: Mood normal.        Behavior: Behavior normal.      ------------------------------------------------------------------------------------------------------------------------------------------------------------------------------------------------------------------- Assessment and Plan  No problem-specific Assessment & Plan notes found for this encounter.   Meds ordered this encounter  Medications  . Continuous Blood Gluc Receiver (FREESTYLE LIBRE 2 READER) DEVI    Sig: Use to check blood glucose daily.  E11.65    Dispense:  1 each    Refill:  0    Return in about 3 months (around 12/05/2020) for HTN.    This visit occurred during the SARS-CoV-2 public health emergency.  Safety protocols were in place, including screening questions prior to the visit, additional usage of  staff PPE, and extensive cleaning of exam room while observing appropriate contact time as indicated for disinfecting solutions.

## 2020-09-07 NOTE — Assessment & Plan Note (Signed)
BP elevated here today however reading at home and this morning at endocrinology have been normal.  Will hold off on continued titration of BP medications at this time.  Low sodium diet discussed.

## 2020-09-10 ENCOUNTER — Other Ambulatory Visit: Payer: Self-pay | Admitting: Family Medicine

## 2020-09-14 DIAGNOSIS — N200 Calculus of kidney: Secondary | ICD-10-CM | POA: Diagnosis not present

## 2020-09-14 DIAGNOSIS — E785 Hyperlipidemia, unspecified: Secondary | ICD-10-CM | POA: Diagnosis not present

## 2020-09-14 DIAGNOSIS — I25118 Atherosclerotic heart disease of native coronary artery with other forms of angina pectoris: Secondary | ICD-10-CM | POA: Diagnosis not present

## 2020-09-14 DIAGNOSIS — M009 Pyogenic arthritis, unspecified: Secondary | ICD-10-CM | POA: Diagnosis not present

## 2020-09-14 DIAGNOSIS — E119 Type 2 diabetes mellitus without complications: Secondary | ICD-10-CM | POA: Diagnosis not present

## 2020-09-14 DIAGNOSIS — I10 Essential (primary) hypertension: Secondary | ICD-10-CM | POA: Diagnosis not present

## 2020-09-15 ENCOUNTER — Other Ambulatory Visit: Payer: Self-pay | Admitting: Infectious Disease

## 2020-09-15 ENCOUNTER — Other Ambulatory Visit (HOSPITAL_COMMUNITY): Payer: Self-pay

## 2020-09-17 DIAGNOSIS — Z96642 Presence of left artificial hip joint: Secondary | ICD-10-CM | POA: Diagnosis not present

## 2020-09-18 ENCOUNTER — Ambulatory Visit (INDEPENDENT_AMBULATORY_CARE_PROVIDER_SITE_OTHER): Payer: HMO | Admitting: Infectious Disease

## 2020-09-18 ENCOUNTER — Other Ambulatory Visit (HOSPITAL_COMMUNITY): Payer: Self-pay

## 2020-09-18 ENCOUNTER — Encounter: Payer: Self-pay | Admitting: Infectious Disease

## 2020-09-18 ENCOUNTER — Other Ambulatory Visit: Payer: Self-pay

## 2020-09-18 VITALS — BP 171/81 | HR 62 | Resp 16 | Ht 71.0 in | Wt 231.4 lb

## 2020-09-18 DIAGNOSIS — E11649 Type 2 diabetes mellitus with hypoglycemia without coma: Secondary | ICD-10-CM

## 2020-09-18 DIAGNOSIS — Z96649 Presence of unspecified artificial hip joint: Secondary | ICD-10-CM

## 2020-09-18 DIAGNOSIS — T8459XD Infection and inflammatory reaction due to other internal joint prosthesis, subsequent encounter: Secondary | ICD-10-CM

## 2020-09-18 DIAGNOSIS — A4159 Other Gram-negative sepsis: Secondary | ICD-10-CM | POA: Diagnosis not present

## 2020-09-18 DIAGNOSIS — E875 Hyperkalemia: Secondary | ICD-10-CM | POA: Diagnosis not present

## 2020-09-18 DIAGNOSIS — I10 Essential (primary) hypertension: Secondary | ICD-10-CM

## 2020-09-18 NOTE — Progress Notes (Signed)
Subjective:  Chief complaint followup for PJI some pain that sounds like sciatica by his description.    Patient ID: [name], male    DOB: 1945/10/03, 75 y.o.   MRN: [id number]  HPI   75 year old Black man with PMHx of CAD, DM2, HTN, CKD, OA, s/p left hip arthoplasty (2001), prostate cancer, PAF, left ureteral stone s/p stenting (01/2020) who was sent from ortho clinic on 11/23 by Dr Wynelle Link. He grew Enterobacter from blood, urine and hip aspirate. He is sp surgery with Irrigation and debridement, left hip, with bearing surface exchange. Enterobacter grew on cultures.  We had him scheduled to complete antibiotics on 10 January.  In the interim he developed a fluid collection in his thigh that was drained and seemed rather bloody but which also grew Enterobacter from culture.  He finished cefepime and we started bactrim DS BID he ran into problems with hyperkalemia and elevated creatinine also in the context of other potassium sparing and potassium raising medications  We obtained Nuzyra PA though due to his deductible not having yet been met he would have to pay roughly $2k upfront.  He ended up doing that but then turned out that he need to pay $640 a month even though he had met that amount.  He is to take his nuzyra.  He has pain in his legs bilaterally somewhat in his hip which she largely experiences at night and associates with taking his atorvastatin.  Describes the pain at that time is burning in nature.  He does still have gout that flares when he comes off of his colchicine apparently had 1 since I last saw him.  He has seen Dr [name] who feels very good about how [name] is doing and is confident he will not need another surgery.     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   Shortness of breath    WITH EXERTION   Thigh abscess 04/13/2020   Type 2 diabetes mellitus ([place])     Past Surgical History:  Procedure Laterality Date   BUNIONECTOMY     CYSTOSCOPY W/ RETROGRADES Left 03/14/2014   Procedure: CYSTOSCOPY WITH LEFT RETROGRADE PYELOGRAM, Left Ureteroscopy, Lweft ureteral Stent No string;  Surgeon: [name], MD;  Location: [place] [place];  Service: Urology;  Laterality: Left;   CYSTOSCOPY W/ URETERAL STENT PLACEMENT Bilateral 01/15/2020   Procedure: CYSTOSCOPY WITH RETROGRADE PYELOGRAM/URETERAL STENT PLACEMENT;  Surgeon: Alexis Frock, MD;  [facility];  Service: Urology;  Laterality: Bilateral;   CYSTOSCOPY/URETEROSCOPY/HOLMIUM LASER/STENT PLACEMENT Bilateral 03/17/2020   Procedure: CYSTOSCOPY/URETEROSCOPY/HOLMIUM LASER/STENT LEFT PLACEMENT;  Surgeon: Festus Aloe, MD;  Location: [facility];  Service: Urology;  Laterality: Bilateral;   HERNIA REPAIR     INCISION AND DRAINAGE Left 03/09/2020   Procedure: INCISION AND DRAINAGE LEFT HIP WITH  LINER EXCHANGE;  Surgeon: Gaynelle Arabian, MD;  Location: [facility];  Service: Orthopedics;  Laterality: Left;   IR FLUORO GUIDE CV LINE RIGHT  03/10/2020   IR RADIOLOGIST EVAL & MGMT  04/01/2020   IR RADIOLOGIST EVAL & MGMT  04/15/2020   IR RADIOLOGIST EVAL & MGMT  04/30/2020   IR REMOVAL TUN CV CATH W/O FL  04/22/2020   IR US GUIDE BX ASP/DRAIN  03/17/2020   IR US GUIDE VASC ACCESS RIGHT  03/10/2020   PROSTATE BIOPSY     TOTAL HIP ARTHROPLASTY Left 03-20-2009   TOTAL HIP ARTHROPLASTY  04/09/2012   Procedure: TOTAL HIP ARTHROPLASTY;  Surgeon: [name], MD;  Location: [facility];  Service: Orthopedics;  Laterality: Right;   TOTAL KNEE ARTHROPLASTY Left 05-16-2008    Family History  Problem Relation Age of Onset   Cancer Mother        breast   Multiple sclerosis Daughter    Cancer Father        prostate   Diabetes Father    Cancer Maternal Uncle        bone      Social History   Socioeconomic History   Marital status: Widowed    Spouse name: Not on file   Number of children: Not on file   Years of education: Not on file   Highest education level: Not on file  Occupational History   Not on file  Tobacco Use   Smoking status: Former    Packs/day: 1.00    Years: 25.00    Pack years: 25.00    Types: Cigarettes    Quit date: 04/11/1984    Years since quitting: 36.4   Smokeless tobacco: Former    Quit date: 04/02/1985  Vaping Use   Vaping Use: Never used  Substance and Sexual Activity   Alcohol use: Not Currently    Comment: RARE   Drug use: No   Sexual activity: Not Currently  Other Topics Concern   Not on file  Social History Narrative   Not on file   Social Determinants of Health   Financial Resource Strain: Not on file  Food Insecurity: Not on file  Transportation Needs: Not on file  Physical Activity: Not on file  Stress: Not on file  Social Connections: Not on file    Allergies  Allergen Reactions   Shellfish Allergy Rash     Current Outpatient Medications:    allopurinol (ZYLOPRIM) 100 MG tablet, Take 0.5 tablets (50 mg total) by mouth daily., Disp: 45 tablet, Rfl: 1   amiodarone (PACERONE) 200 MG tablet, Take 200 mg by mouth daily., Disp: , Rfl:    amLODipine (NORVASC) 5 MG tablet, Take 1 tablet (5 mg total) by mouth daily., Disp: 90 tablet, Rfl: 3   Aspirin 81 MG CAPS, Take 81 mg by mouth daily., Disp: , Rfl:    clotrimazole-betamethasone (LOTRISONE) cream, Apply 1 application topically daily., Disp: 30 g, Rfl: 0   colchicine 0.6 MG tablet, TAKE 1 TABLET BY MOUTH EVERY DAY, Disp: 90 tablet,  Rfl: 1   colesevelam (WELCHOL) 625 MG tablet, Take 2 tablets (1,250 mg total) by mouth daily., Disp: 180 tablet, Rfl: 3   Continuous Blood Gluc Receiver (FREESTYLE LIBRE 2 READER) DEVI, Use to check blood glucose daily.  E11.65, Disp: 1 each, Rfl: 0   Continuous Blood Gluc Sensor (FREESTYLE LIBRE 14 DAY SENSOR) MISC, 1 DEVICE EVERY 14 (FOURTEEN) DAYS., Disp: 6 each, Rfl: 1   cycloSPORINE (RESTASIS)  0.05 % ophthalmic emulsion, Place 1 drop into both eyes 2 (two) times daily. , Disp: , Rfl:    docusate sodium (COLACE) 100 MG capsule, Take 100 mg by mouth daily as needed for mild constipation., Disp: , Rfl:    ferrous sulfate 325 (65 FE) MG tablet, Take 325 mg by mouth daily with breakfast., Disp: , Rfl:    glimepiride (AMARYL) 1 MG tablet, Take 1 tablet (1 mg total) by mouth daily with breakfast., Disp: 90 tablet, Rfl: 3   metoprolol succinate (TOPROL-XL) 50 MG 24 hr tablet, Take 50 mg by mouth daily., Disp: , Rfl:    Omadacycline Tosylate 150 MG TABS, Take 2 tablets (300 mg) by mouth daily., Disp: 60 tablet, Rfl: 11   omeprazole (PRILOSEC) 40 MG capsule, TAKE 1 CAPSULE BY MOUTH TWICE A DAY FOR GERD, Disp: 90 capsule, Rfl: 0   rosuvastatin (CRESTOR) 40 MG tablet, Take 40 mg by mouth daily., Disp: , Rfl:    sitaGLIPtin (JANUVIA) 50 MG tablet, Take 1 tablet (50 mg total) by mouth daily., Disp: 30 tablet, Rfl: 0   spironolactone (ALDACTONE) 25 MG tablet, TAKE 1 TABLET BY MOUTH EVERY DAY FOR EDEMA, Disp: 90 tablet, Rfl: 1   Review of Systems     Objective:   Physical Exam Left hip wound 05/20/2019:    Surgical site today June 26, 2020:         Assessment & Plan:   Prosthetic joint infection status post exchange of part of the prosthesis with Enterobacter isolated:  We will check labs again today.  They may be again affected by his gout which flared as recently as last week.  After thorough discussion we decided to take him off antibiotics and observe him and he is to watch for  recurrence of pain in particular in his hip or other symptoms of to suggestive of infection.  If he is doing well we will see him in 2 months time and recheck his inflammatory markers and his clinical status.  Certainly if he has return of his pain in the interim we will place him back on antibiotics with this time likely trial him on a fluoroquinolone as will be much more affordable than the nuzyra  HTN: bp high needs to follow-up with PCP regarding this.   Hyperkalemia: resolved  DM: problem with FQ and BG if we go onto FQ but we will take that risk if we have to IF he has recurrence  I spent more than 39mnutes with the patient including greater than 50% of time in face to face counseling of the patient personally reviewing radiographs, along with pertinent laboratory microbiological data review of medical records and in coordination of his care.

## 2020-09-19 LAB — BASIC METABOLIC PANEL WITH GFR
BUN/Creatinine Ratio: 21 (calc) (ref 6–22)
BUN: 26 mg/dL — ABNORMAL HIGH (ref 7–25)
CO2: 21 mmol/L (ref 20–32)
Calcium: 8.6 mg/dL (ref 8.6–10.3)
Chloride: 110 mmol/L (ref 98–110)
Creat: 1.21 mg/dL — ABNORMAL HIGH (ref 0.70–1.18)
GFR, Est African American: 67 mL/min/{1.73_m2} (ref >=60)
GFR, Est Non African American: 58 mL/min/{1.73_m2} — ABNORMAL LOW (ref >=60)
Glucose, Bld: 93 mg/dL (ref 65–99)
Potassium: 4.3 mmol/L (ref 3.5–5.3)
Sodium: 141 mmol/L (ref 135–146)

## 2020-09-19 LAB — CBC WITH DIFFERENTIAL/PLATELET
Absolute Monocytes: 732 cells/uL (ref 200–950)
Basophils Absolute: 31 cells/uL (ref 0–200)
Basophils Relative: 0.5 %
Eosinophils Absolute: 174 cells/uL (ref 15–500)
Eosinophils Relative: 2.8 %
HCT: 28.3 % — ABNORMAL LOW (ref 38.5–50.0)
Hemoglobin: 9.3 g/dL — ABNORMAL LOW (ref 13.2–17.1)
Lymphs Abs: 1308 cells/uL (ref 850–3900)
MCH: 29.7 pg (ref 27.0–33.0)
MCHC: 32.9 g/dL (ref 32.0–36.0)
MCV: 90.4 fL (ref 80.0–100.0)
MPV: 9.5 fL (ref 7.5–12.5)
Monocytes Relative: 11.8 %
Neutro Abs: 3956 cells/uL (ref 1500–7800)
Neutrophils Relative %: 63.8 %
Platelets: 448 10*3/uL — ABNORMAL HIGH (ref 140–400)
RBC: 3.13 10*6/uL — ABNORMAL LOW (ref 4.20–5.80)
RDW: 14.8 % (ref 11.0–15.0)
Total Lymphocyte: 21.1 %
WBC: 6.2 10*3/uL (ref 3.8–10.8)

## 2020-09-19 LAB — C-REACTIVE PROTEIN: CRP: 6.8 mg/L (ref <8.0)

## 2020-09-19 LAB — SEDIMENTATION RATE: Sed Rate: 99 mm/h — ABNORMAL HIGH (ref 0–20)

## 2020-09-30 ENCOUNTER — Other Ambulatory Visit (HOSPITAL_COMMUNITY): Payer: Self-pay

## 2020-10-01 ENCOUNTER — Telehealth: Payer: Self-pay

## 2020-10-01 NOTE — Telephone Encounter (Signed)
Patient called to report recurrence of pain as directed by Dr. Tommy Medal. States it started yesterday and that it comes and goes. Says it feels similar to when he was diagnosed with the infection, but not quite as bad. Denies fever and chills. Reports that his joint burns, but that the pain is mostly from his left thigh to his knee. He says the pain is not as bad when he starts walking around. Will route to provider.   Beryle Flock, RN

## 2020-10-01 NOTE — Telephone Encounter (Signed)
Called patient to relay recommendations, no answer. Left HIPAA compliant voicemail requesting callback.   Beryle Flock, RN

## 2020-10-01 NOTE — Telephone Encounter (Signed)
Please schedule him with Jac Canavan  If he has fever chill, needs to be seen urgently, but if not whenever Jac Canavan come back  He should also let his surgeon know  Thanks

## 2020-10-02 NOTE — Telephone Encounter (Signed)
Spoke with patient, moved his appointment with Dr. Tommy Medal up to 10/16/20. Advised patient that if he develops fever or chills he will need to be seen urgently and to go to the emergency room. Instructed patient to let his surgeon know about the recurring pain. He verbalized understanding and has no further questions.   Beryle Flock, RN

## 2020-10-05 DIAGNOSIS — H04123 Dry eye syndrome of bilateral lacrimal glands: Secondary | ICD-10-CM | POA: Diagnosis not present

## 2020-10-05 DIAGNOSIS — Z961 Presence of intraocular lens: Secondary | ICD-10-CM | POA: Diagnosis not present

## 2020-10-05 DIAGNOSIS — E119 Type 2 diabetes mellitus without complications: Secondary | ICD-10-CM | POA: Diagnosis not present

## 2020-10-05 DIAGNOSIS — H524 Presbyopia: Secondary | ICD-10-CM | POA: Diagnosis not present

## 2020-10-05 DIAGNOSIS — H52203 Unspecified astigmatism, bilateral: Secondary | ICD-10-CM | POA: Diagnosis not present

## 2020-10-05 LAB — HM DIABETES EYE EXAM

## 2020-10-08 DIAGNOSIS — Z96642 Presence of left artificial hip joint: Secondary | ICD-10-CM | POA: Diagnosis not present

## 2020-10-08 DIAGNOSIS — M25552 Pain in left hip: Secondary | ICD-10-CM | POA: Diagnosis not present

## 2020-10-16 ENCOUNTER — Encounter: Payer: Self-pay | Admitting: Infectious Disease

## 2020-10-16 ENCOUNTER — Other Ambulatory Visit (HOSPITAL_COMMUNITY): Payer: Self-pay

## 2020-10-16 ENCOUNTER — Ambulatory Visit: Payer: HMO | Admitting: Infectious Disease

## 2020-10-16 ENCOUNTER — Other Ambulatory Visit: Payer: Self-pay

## 2020-10-16 VITALS — BP 174/79 | HR 69 | Wt 228.8 lb

## 2020-10-16 DIAGNOSIS — E11649 Type 2 diabetes mellitus with hypoglycemia without coma: Secondary | ICD-10-CM | POA: Diagnosis not present

## 2020-10-16 DIAGNOSIS — T8459XD Infection and inflammatory reaction due to other internal joint prosthesis, subsequent encounter: Secondary | ICD-10-CM

## 2020-10-16 DIAGNOSIS — I4891 Unspecified atrial fibrillation: Secondary | ICD-10-CM

## 2020-10-16 DIAGNOSIS — R7881 Bacteremia: Secondary | ICD-10-CM

## 2020-10-16 DIAGNOSIS — A4159 Other Gram-negative sepsis: Secondary | ICD-10-CM

## 2020-10-16 DIAGNOSIS — Z96649 Presence of unspecified artificial hip joint: Secondary | ICD-10-CM

## 2020-10-16 DIAGNOSIS — R9431 Abnormal electrocardiogram [ECG] [EKG]: Secondary | ICD-10-CM | POA: Diagnosis not present

## 2020-10-16 HISTORY — DX: Abnormal electrocardiogram (ECG) (EKG): R94.31

## 2020-10-16 HISTORY — DX: Unspecified atrial fibrillation: I48.91

## 2020-10-16 MED ORDER — LEVOFLOXACIN 500 MG PO TABS
500.0000 mg | ORAL_TABLET | Freq: Every day | ORAL | 11 refills | Status: DC
Start: 2020-10-16 — End: 2020-12-25
  Filled 2020-10-16: qty 30, 30d supply, fill #0
  Filled 2020-11-16: qty 30, 30d supply, fill #1
  Filled 2020-12-16: qty 30, 30d supply, fill #2

## 2020-10-16 NOTE — Progress Notes (Signed)
Subjective:  Chief complaint followup for PJI some pain that sounds like sciatica by his description.    Patient ID: [name], male    DOB: 03-02-1946, 75 y.o.   MRN: [id number]  HPI   75 year old Black man with PMHx of CAD, DM2, HTN, CKD, OA, s/p left hip arthoplasty (2001), prostate cancer, PAF, left ureteral stone s/p stenting (01/2020) who was sent from ortho clinic on 11/23 by Dr Wynelle Link. He grew Enterobacter from blood, urine and hip aspirate. He is sp surgery with Irrigation and debridement, left hip, with bearing surface exchange. Enterobacter grew on cultures.  We had him scheduled to complete antibiotics on 10 January.  In the interim he developed a fluid collection in his thigh that was drained and seemed rather bloody but which also grew Enterobacter from culture.  He finished cefepime and we started bactrim DS BID he ran into problems with hyperkalemia and elevated creatinine also in the context of other potassium sparing and potassium raising medications  We obtained Nuzyra PA though due to his deductible not having yet been met he would have to pay roughly $2k upfront.  He ended up doing that but then turned out that he need to pay $640 a month even though he had met that amount.  I last saw [name] we took him of the [place] because clinically seem to be doing well and his CRP had normalized though his sed rate had not normalized I did come down from 140 into the 90s.  I also felt like continue him on this tetracycline would just be to cost prohibitive.  Unfortunately since he came off antibiotics he experienced recurrence of infection at his hip with increasing pain and also pus which began coming through the skin.  He was seen by Dr.  [name]  who aspirated the area and sent for culture. Exact same Enterobacter grew yet again from culture.  Clearly has to be placed back on antibiotics.  We are going to trial him on levofloxacin at 500 mg despite risk for C.  difficile colitis hypo or hyperglycemia confusion and also potential QT prolongation.       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   Shortness of breath    WITH EXERTION   Thigh abscess 04/13/2020   Type 2 diabetes mellitus ([place])     Past Surgical History:  Procedure Laterality Date   BUNIONECTOMY     CYSTOSCOPY W/ RETROGRADES Left 03/14/2014   Procedure: CYSTOSCOPY WITH LEFT RETROGRADE PYELOGRAM, Left Ureteroscopy, [place] ureteral Stent No string;  Surgeon: [name], MD;  Location: [facility];  Service: Urology;  Laterality: Left;   CYSTOSCOPY W/ URETERAL STENT PLACEMENT Bilateral 01/15/2020   Procedure: CYSTOSCOPY WITH RETROGRADE PYELOGRAM/URETERAL STENT PLACEMENT;  Surgeon: Alexis Frock, MD;  Location: [facility];  Service: Urology;  Laterality: Bilateral;   CYSTOSCOPY/URETEROSCOPY/HOLMIUM LASER/STENT PLACEMENT Bilateral 03/17/2020   Procedure: CYSTOSCOPY/URETEROSCOPY/HOLMIUM LASER/STENT LEFT PLACEMENT;  Surgeon: Festus Aloe, MD;  Location: [facility];  Service: Urology;  Laterality: Bilateral;   HERNIA REPAIR     INCISION AND DRAINAGE Left 03/09/2020   Procedure: INCISION AND DRAINAGE LEFT HIP WITH LINER EXCHANGE;  Surgeon: Gaynelle Arabian, MD;  Location: [facility];  Service: Orthopedics;  Laterality: Left;   IR FLUORO GUIDE CV LINE RIGHT  03/10/2020   IR RADIOLOGIST EVAL & MGMT  04/01/2020   IR RADIOLOGIST EVAL & MGMT   04/15/2020   IR RADIOLOGIST EVAL & MGMT  04/30/2020   IR REMOVAL TUN CV CATH W/O FL  04/22/2020   IR US GUIDE BX ASP/DRAIN  03/17/2020   IR US GUIDE VASC ACCESS RIGHT  03/10/2020   PROSTATE BIOPSY     TOTAL HIP ARTHROPLASTY Left 03-20-2009   TOTAL HIP ARTHROPLASTY  04/09/2012   Procedure: TOTAL HIP ARTHROPLASTY;  Surgeon: [name], MD;  Location: [facility];  Service: Orthopedics;  Laterality: Right;   TOTAL KNEE ARTHROPLASTY Left 05-16-2008    Family History  Problem Relation Age of Onset   Cancer Mother        breast   Multiple sclerosis Daughter    Cancer Father        prostate   Diabetes Father    Cancer Maternal Uncle        bone      Social History   Socioeconomic History   Marital status: Widowed    Spouse name: Not on file   Number of children: Not on file   Years of education: Not on file   Highest education level: Not on file  Occupational History   Not on file  Tobacco Use   Smoking status: Former    Packs/day: 1.00    Years: 25.00    Pack years: 25.00    Types: Cigarettes    Quit date: 04/11/1984    Years since quitting: 36.5   Smokeless tobacco: Former    Quit date: 04/02/1985  Vaping Use   Vaping Use: Never used  Substance and Sexual Activity   Alcohol use: Not Currently    Comment: RARE   Drug use: No   Sexual activity: Not Currently  Other Topics Concern   Not on file  Social History Narrative   Not on file   Social Determinants of Health   Financial Resource Strain: Not on file  Food Insecurity: Not on file  Transportation Needs: Not on file  Physical Activity: Not on file  Stress: Not on file  Social Connections: Not on file    Allergies  Allergen Reactions   Shellfish Allergy Rash     Current Outpatient Medications:    allopurinol (ZYLOPRIM) 100 MG tablet, Take 0.5 tablets (50 mg total) by mouth daily., Disp: 45 tablet, Rfl: 1   amiodarone (PACERONE) 200 MG tablet, Take 200 mg by mouth daily., Disp: , Rfl:    amLODipine  (NORVASC) 5 MG tablet, Take 1 tablet (5 mg total) by mouth daily., Disp: 90 tablet, Rfl: 3   Aspirin 81 MG CAPS, Take 81 mg by mouth daily., Disp: , Rfl:    clotrimazole-betamethasone (LOTRISONE) cream, Apply 1 application topically daily., Disp: 30 g, Rfl: 0   colchicine 0.6 MG tablet, TAKE 1 TABLET BY MOUTH EVERY DAY, Disp: 90 tablet, Rfl: 1   colesevelam (WELCHOL) 625 MG tablet, Take 2 tablets (1,250 mg total) by mouth daily.,  Disp: 180 tablet, Rfl: 3   Continuous Blood Gluc Receiver (FREESTYLE LIBRE 2 READER) DEVI, Use to check blood glucose daily.  E11.65, Disp: 1 each, Rfl: 0   Continuous Blood Gluc Sensor (FREESTYLE LIBRE 14 DAY SENSOR) MISC, 1 DEVICE EVERY 14 (FOURTEEN) DAYS., Disp: 6 each, Rfl: 1   cycloSPORINE (RESTASIS) 0.05 % ophthalmic emulsion, Place 1 drop into both eyes 2 (two) times daily. , Disp: , Rfl:    docusate sodium (COLACE) 100 MG capsule, Take 100 mg by mouth daily as needed for mild constipation., Disp: , Rfl:    ferrous sulfate 325 (65 FE) MG tablet, Take 325 mg by mouth daily with breakfast., Disp: , Rfl:    glimepiride (AMARYL) 1 MG tablet, Take 1 tablet (1 mg total) by mouth daily with breakfast., Disp: 90 tablet, Rfl: 3   metoprolol succinate (TOPROL-XL) 50 MG 24 hr tablet, Take 50 mg by mouth daily., Disp: , Rfl:    omeprazole (PRILOSEC) 40 MG capsule, TAKE 1 CAPSULE BY MOUTH TWICE A DAY FOR GERD, Disp: 90 capsule, Rfl: 0   rosuvastatin (CRESTOR) 40 MG tablet, Take 40 mg by mouth daily., Disp: , Rfl:    sitaGLIPtin (JANUVIA) 50 MG tablet, Take 1 tablet (50 mg total) by mouth daily., Disp: 30 tablet, Rfl: 0   spironolactone (ALDACTONE) 25 MG tablet, TAKE 1 TABLET BY MOUTH EVERY DAY FOR EDEMA, Disp: 90 tablet, Rfl: 1   Review of Systems     Objective:   Physical Exam Constitutional:      General: He is not in acute distress.    Appearance: He is well-developed. He is not diaphoretic.  HENT:     Head: Normocephalic and atraumatic.     Mouth/Throat:      Pharynx: No oropharyngeal exudate.  Eyes:     General: No scleral icterus.    Conjunctiva/sclera: Conjunctivae normal.  Cardiovascular:     Rate and Rhythm: Normal rate and regular rhythm.  Pulmonary:     Effort: Pulmonary effort is normal. No respiratory distress.     Breath sounds: No wheezing.  Abdominal:     General: There is no distension.  Musculoskeletal:        General: No tenderness.     Cervical back: Normal range of motion and neck supple.  Skin:    General: Skin is warm and dry.     Coloration: Skin is not pale.     Findings: No erythema or rash.  Neurological:     General: No focal deficit present.     Mental Status: He is alert and oriented to person, place, and time.     Motor: No abnormal muscle tone.     Coordination: Coordination normal.  Psychiatric:        Mood and Affect: Mood normal.        Behavior: Behavior normal.        Thought Content: Thought content normal.        Judgment: Judgment normal.   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  Therefore we will  trial him on levofloxacin at 500 mg.  I have counseled him on the risks of C. difficile colitis hypo and hyperglycemia confusion and potential QT prolongation.  If we run into problems with the fluorouinolone we will likely see if we can rechallenge him with Bactrim but in the setting of discontinuing other drugs that cause hyperkalemia and creatinine elevation, such as his potassium and spironolactone.  Perhaps off of these he could tolerate Bactrim either of the double strength dose or the  lower single strength twice daily dose.  Check inflammatory markers and basic labs today as well.  We will also bring her back for an EKG next week and will bring his medications himself.  Atrial fibrillation on amiodarone.  QT prolongation on amiodarone he does have a QT interval that is in the 400s.  We will therefore bring him back next week to check his QT again on the fluoroquinolone and amiodarone.  s.   Hyperkalemia: resolved  DM: We will monitor blood sugars on the floor Sheran Lawless  I spent more than 35 minutes with the patient including face to face counseling of the patient on his prosthetic hip infection risks and benefits of different antimicrobials that we can use personally reviewing radiographs, along with pertinent laboratory microbiological, data review of medical records before and during the visit and in coordination of his care with Dr. [name] and [facility] pharmacy.

## 2020-10-17 LAB — BASIC METABOLIC PANEL WITH GFR
BUN/Creatinine Ratio: 16 (calc) (ref 6–22)
BUN: 20 mg/dL (ref 7–25)
CO2: 21 mmol/L (ref 20–32)
Calcium: 8.7 mg/dL (ref 8.6–10.3)
Chloride: 110 mmol/L (ref 98–110)
Creat: 1.22 mg/dL — ABNORMAL HIGH (ref 0.70–1.18)
GFR, Est African American: 67 mL/min/{1.73_m2} (ref >=60)
GFR, Est Non African American: 58 mL/min/{1.73_m2} — ABNORMAL LOW (ref >=60)
Glucose, Bld: 127 mg/dL — ABNORMAL HIGH (ref 65–99)
Potassium: 4.5 mmol/L (ref 3.5–5.3)
Sodium: 139 mmol/L (ref 135–146)

## 2020-10-17 LAB — C-REACTIVE PROTEIN: CRP: 5.4 mg/L (ref <8.0)

## 2020-10-17 LAB — SEDIMENTATION RATE: Sed Rate: 79 mm/h — ABNORMAL HIGH (ref 0–20)

## 2020-10-18 ENCOUNTER — Other Ambulatory Visit: Payer: Self-pay | Admitting: Family Medicine

## 2020-10-20 ENCOUNTER — Telehealth: Payer: Self-pay | Admitting: Family Medicine

## 2020-10-20 NOTE — Chronic Care Management (AMB) (Signed)
  Chronic Care Management   Note  10/20/2020 Name: ANGELL HONSE MRN: [id number] DOB: 1946/02/20  AMINE ADELSON is a 75 y.o. year old male who is a primary care patient of Luetta Nutting, DO. I reached out to Farrel Demark by phone today in response to a referral sent by Mr. Rowe Clack Boliver's PCP, Luetta Nutting, DO.   Mr. Tippin was given information about Chronic Care Management services today including:  CCM service includes personalized support from designated clinical staff supervised by his physician, including individualized plan of care and coordination with other care providers 24/7 contact phone numbers for assistance for urgent and routine care needs. Service will only be billed when office clinical staff spend 20 minutes or more in a month to coordinate care. Only one practitioner may furnish and bill the service in a calendar month. The patient may stop CCM services at any time (effective at the end of the month) by phone call to the office staff.   Patient agreed to services and verbal consent obtained.   Follow up plan:   Lauretta Grill [employer] Scheduler

## 2020-10-21 DIAGNOSIS — Z96642 Presence of left artificial hip joint: Secondary | ICD-10-CM | POA: Diagnosis not present

## 2020-10-22 ENCOUNTER — Encounter: Payer: Self-pay | Admitting: Infectious Disease

## 2020-10-22 ENCOUNTER — Ambulatory Visit: Payer: HMO | Admitting: Infectious Disease

## 2020-10-22 ENCOUNTER — Other Ambulatory Visit: Payer: Self-pay

## 2020-10-22 VITALS — BP 155/71 | HR 73 | Temp 98.6°F | Wt 228.4 lb

## 2020-10-22 DIAGNOSIS — Z96649 Presence of unspecified artificial hip joint: Secondary | ICD-10-CM | POA: Diagnosis not present

## 2020-10-22 DIAGNOSIS — N183 Chronic kidney disease, stage 3 unspecified: Secondary | ICD-10-CM | POA: Diagnosis not present

## 2020-10-22 DIAGNOSIS — E11649 Type 2 diabetes mellitus with hypoglycemia without coma: Secondary | ICD-10-CM | POA: Diagnosis not present

## 2020-10-22 DIAGNOSIS — L02419 Cutaneous abscess of limb, unspecified: Secondary | ICD-10-CM

## 2020-10-22 DIAGNOSIS — R7881 Bacteremia: Secondary | ICD-10-CM

## 2020-10-22 DIAGNOSIS — A4159 Other Gram-negative sepsis: Secondary | ICD-10-CM | POA: Diagnosis not present

## 2020-10-22 DIAGNOSIS — I10 Essential (primary) hypertension: Secondary | ICD-10-CM

## 2020-10-22 DIAGNOSIS — T8459XD Infection and inflammatory reaction due to other internal joint prosthesis, subsequent encounter: Secondary | ICD-10-CM

## 2020-10-22 DIAGNOSIS — R9431 Abnormal electrocardiogram [ECG] [EKG]: Secondary | ICD-10-CM

## 2020-10-22 NOTE — Progress Notes (Signed)
Subjective:  Chief complaint: Still here with some hip pain but here for follow-up after initiating levofloxacin  Patient ID: [name], male    DOB: Jan 03, 1946, 75 y.o.   MRN: [id number]  HPI  75 year old Black man with PMHx of CAD, DM2, HTN, CKD, OA, s/p left hip arthoplasty (2001), prostate cancer, PAF, left ureteral stone s/p stenting (01/2020) who was sent from ortho clinic on 11/23 by Dr Wynelle Link. He grew Enterobacter from blood, urine and hip aspirate. He is sp surgery with Irrigation and debridement, left hip, with bearing surface exchange. Enterobacter grew on cultures.  We had him scheduled to complete antibiotics on 10 January.  In the interim he developed a fluid collection in his thigh that was drained and seemed rather bloody but which also grew Enterobacter from culture.   He finished cefepime and we started bactrim DS BID he ran into problems with hyperkalemia and elevated creatinine also in the context of other potassium sparing and potassium raising medications   We obtained Nuzyra PA though due to his deductible not having yet been met he would have to pay roughly $2k upfront.   He ended up doing that but then turned out that he need to pay $640 a month even though he had met that amount.   I then took [name] off he [name] because clinically seem to be doing well and his CRP had normalized though his sed rate had not normalized I did come down from 140 into the 90s.  I also felt like continue him on this tetracycline would just be to cost prohibitive.  Unfortunately since he came off antibiotics he experienced recurrence of infection at his hip with increasing pain and also pus which began coming through the skin.  He was seen by Dr.  [name]  who aspirated the area and sent for culture. Exact same Enterobacter grew yet again from culture.   After careful consideration we decided to start levofloxacin despite risk of C. difficile colitis, Achilles tendinopathy  confusion problems with blood sugar control and QT prolongation in the context of him being on amiodarone and having slight QT prolongation at baseline.  Since starting levofloxacin he has had no adverse effects from this.  He returned to clinic today and we obtained a twelve-lead EKG.  If EKG shows normal sinus rhythm with heart first-degree AV block but no significant ST or T wave changes and a QT and QTc of 429 and 451 which is largely unchanged from his last EKG obtained.    Past Medical History:  Diagnosis Date   Arthritis    Asthma    no inhaler   Atrial fibrillation ([place]) 10/16/2020   Coronary artery disease    cardiologist-  dr spruill; last visit 3 mos ago per pt   Enterobacter sepsis ([place]) 04/13/2020   GERD (gastroesophageal reflux disease)    History of MI (myocardial infarction)    1985   Hydronephrosis, left    Hypertension    Myocardial infarction [facility])    Nephrolithiasis    left   Neuromuscular disorder (HCC)    TINGLING IN BOTH HANDS   Presence of tooth-root and mandibular implants    lower dental implants   Prostate cancer ([place])    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   QT prolongation 10/16/2020   Shortness of breath    WITH EXERTION   Thigh abscess 04/13/2020   Type 2 diabetes mellitus ([place])     Past Surgical History:  Procedure  Laterality Date   BUNIONECTOMY     CYSTOSCOPY W/ RETROGRADES Left 03/14/2014   Procedure: CYSTOSCOPY WITH LEFT RETROGRADE PYELOGRAM, Left Ureteroscopy, [place] ureteral Stent No string;  Surgeon: [name], MD;  Location: [facility];  Service: Urology;  Laterality: Left;   CYSTOSCOPY W/ URETERAL STENT PLACEMENT Bilateral 01/15/2020   Procedure: CYSTOSCOPY WITH RETROGRADE PYELOGRAM/URETERAL STENT PLACEMENT;  Surgeon: Alexis Frock, MD;  Location: [facility];  Service: Urology;  Laterality: Bilateral;   CYSTOSCOPY/URETEROSCOPY/HOLMIUM LASER/STENT PLACEMENT Bilateral 03/17/2020   Procedure: CYSTOSCOPY/URETEROSCOPY/HOLMIUM  LASER/STENT LEFT PLACEMENT;  Surgeon: Festus Aloe, MD;  Location: [facility];  Service: Urology;  Laterality: Bilateral;   HERNIA REPAIR     INCISION AND DRAINAGE Left 03/09/2020   Procedure: INCISION AND DRAINAGE LEFT HIP WITH LINER EXCHANGE;  Surgeon: Gaynelle Arabian, MD;  Location: [facility];  Service: Orthopedics;  Laterality: Left;   IR FLUORO GUIDE CV LINE RIGHT  03/10/2020   IR RADIOLOGIST EVAL & MGMT  04/01/2020   IR RADIOLOGIST EVAL & MGMT  04/15/2020   IR RADIOLOGIST EVAL & MGMT  04/30/2020   IR REMOVAL TUN CV CATH W/O FL  04/22/2020   IR US GUIDE BX ASP/DRAIN  03/17/2020   IR US GUIDE VASC ACCESS RIGHT  03/10/2020   PROSTATE BIOPSY     TOTAL HIP ARTHROPLASTY Left 03-20-2009   TOTAL HIP ARTHROPLASTY  04/09/2012   Procedure: TOTAL HIP ARTHROPLASTY;  Surgeon: [name], MD;  Location: [facility];  Service: Orthopedics;  Laterality: Right;   TOTAL KNEE ARTHROPLASTY Left 05-16-2008    Family History  Problem Relation Age of Onset   Cancer Mother        breast   Multiple sclerosis Daughter    Cancer Father        prostate   Diabetes Father    Cancer Maternal Uncle        bone      Social History   Socioeconomic History   Marital status: Widowed    Spouse name: Not on file   Number of children: Not on file   Years of education: Not on file   Highest education level: Not on file  Occupational History   Not on file  Tobacco Use   Smoking status: Former    Packs/day: 1.00    Years: 25.00    Pack years: 25.00    Types: Cigarettes    Quit date: 04/11/1984    Years since quitting: 36.5   Smokeless tobacco: Former    Quit date: 04/02/1985  Vaping Use   Vaping Use: Never used  Substance and Sexual Activity   Alcohol use: Not Currently    Comment: RARE   Drug use: No   Sexual activity: Not Currently  Other Topics Concern   Not on file  Social History Narrative   Not on file   Social Determinants of Health   Financial Resource Strain: Not on file  Food Insecurity:  Not on file  Transportation Needs: Not on file  Physical Activity: Not on file  Stress: Not on file  Social Connections: Not on file    Allergies  Allergen Reactions   Shellfish Allergy Rash     Current Outpatient Medications:    allopurinol (ZYLOPRIM) 100 MG tablet, Take 0.5 tablets (50 mg total) by mouth daily., Disp: 45 tablet, Rfl: 1   amiodarone (PACERONE) 200 MG tablet, Take 200 mg by mouth daily., Disp: , Rfl:    amLODipine (NORVASC) 5 MG tablet, Take 1 tablet (5  mg total) by mouth daily., Disp: 90 tablet, Rfl: 3   Aspirin 81 MG CAPS, Take 81 mg by mouth daily., Disp: , Rfl:    clotrimazole-betamethasone (LOTRISONE) cream, Apply 1 application topically daily., Disp: 30 g, Rfl: 0   colchicine 0.6 MG tablet, TAKE 1 TABLET BY MOUTH EVERY DAY, Disp: 90 tablet, Rfl: 1   colesevelam (WELCHOL) 625 MG tablet, Take 2 tablets (1,250 mg total) by mouth daily., Disp: 180 tablet, Rfl: 3   Continuous Blood Gluc Receiver (FREESTYLE LIBRE 2 READER) DEVI, Use to check blood glucose daily.  E11.65, Disp: 1 each, Rfl: 0   Continuous Blood Gluc Sensor (FREESTYLE LIBRE 14 DAY SENSOR) MISC, 1 DEVICE EVERY 14 (FOURTEEN) DAYS., Disp: 6 each, Rfl: 1   cycloSPORINE (RESTASIS) 0.05 % ophthalmic emulsion, Place 1 drop into both eyes 2 (two) times daily. , Disp: , Rfl:    docusate sodium (COLACE) 100 MG capsule, Take 100 mg by mouth daily as needed for mild constipation., Disp: , Rfl:    ferrous sulfate 325 (65 FE) MG tablet, Take 325 mg by mouth daily with breakfast., Disp: , Rfl:    glimepiride (AMARYL) 1 MG tablet, Take 1 tablet (1 mg total) by mouth daily with breakfast., Disp: 90 tablet, Rfl: 3   levofloxacin (LEVAQUIN) 500 MG tablet, Take 1 tablet (500 mg total) by mouth daily., Disp: 30 tablet, Rfl: 11   metoprolol succinate (TOPROL-XL) 50 MG 24 hr tablet, Take 50 mg by mouth daily., Disp: , Rfl:    omeprazole (PRILOSEC) 40 MG capsule, TAKE 1 CAPSULE BY MOUTH TWICE A DAY FOR GERD, Disp: 90 capsule,  Rfl: 0   rosuvastatin (CRESTOR) 40 MG tablet, Take 40 mg by mouth daily., Disp: , Rfl:    sitaGLIPtin (JANUVIA) 50 MG tablet, Take 1 tablet (50 mg total) by mouth daily., Disp: 30 tablet, Rfl: 0   spironolactone (ALDACTONE) 25 MG tablet, TAKE 1 TABLET BY MOUTH EVERY DAY FOR EDEMA, Disp: 90 tablet, Rfl: 1   Review of Systems  Constitutional:  Negative for chills and fever.  HENT:  Negative for congestion and sore throat.   Eyes:  Negative for photophobia.  Respiratory:  Negative for cough, shortness of breath and wheezing.   Cardiovascular:  Negative for chest pain, palpitations and leg swelling.  Gastrointestinal:  Negative for abdominal pain, blood in stool, constipation, diarrhea, nausea and vomiting.  Genitourinary:  Negative for dysuria, flank pain and hematuria.  Musculoskeletal:  Positive for arthralgias. Negative for back pain and myalgias.  Skin:  Positive for wound. Negative for rash.  Neurological:  Negative for dizziness, tremors, syncope, weakness and headaches.  Hematological:  Does not bruise/bleed easily.  Psychiatric/Behavioral:  Negative for agitation, behavioral problems, confusion, decreased concentration, dysphoric mood, hallucinations, sleep disturbance and suicidal ideas. The patient is not nervous/anxious and is not hyperactive.       Objective:   Physical Exam Constitutional:      General: He is not in acute distress.    Appearance: Normal appearance. He is well-developed. He is not ill-appearing or diaphoretic.  HENT:     Head: Normocephalic and atraumatic.     Right Ear: Hearing and external ear normal.     Left Ear: Hearing and external ear normal.     Nose: No nasal deformity or rhinorrhea.  Eyes:     General: No scleral icterus.    Extraocular Movements: Extraocular movements intact.     Conjunctiva/sclera: Conjunctivae normal.     Right eye: Right conjunctiva is not  injected.     Left eye: Left conjunctiva is not injected.     Pupils: Pupils are  equal, round, and reactive to light.  Neck:     Vascular: No JVD.  Cardiovascular:     Rate and Rhythm: Normal rate and regular rhythm.     Heart sounds: S1 normal and S2 normal.  Pulmonary:     Effort: Pulmonary effort is normal. No respiratory distress.     Breath sounds: No wheezing.  Abdominal:     General: Bowel sounds are normal. There is no distension.     Palpations: Abdomen is soft.     Tenderness: There is no abdominal tenderness.  Musculoskeletal:        General: Normal range of motion.     Right shoulder: Normal.     Left shoulder: Normal.     Cervical back: Normal range of motion and neck supple.     Right hip: Normal.     Left hip: Normal.     Right knee: Normal.     Left knee: Normal.  Lymphadenopathy:     Head:     Right side of head: No submandibular, preauricular or posterior auricular adenopathy.     Left side of head: No submandibular, preauricular or posterior auricular adenopathy.     Cervical: No cervical adenopathy.     Right cervical: No superficial or deep cervical adenopathy.    Left cervical: No superficial or deep cervical adenopathy.  Skin:    General: Skin is warm and dry.     Coloration: Skin is not pale.     Findings: No abrasion, bruising, ecchymosis, erythema, lesion or rash.     Nails: There is no clubbing.  Neurological:     General: No focal deficit present.     Mental Status: He is alert and oriented to person, place, and time.     Sensory: No sensory deficit.     Coordination: Coordination normal.     Gait: Gait normal.  Psychiatric:        Attention and Perception: He is attentive.        Mood and Affect: Mood normal.        Speech: Speech normal.        Behavior: Behavior normal. Behavior is cooperative.        Thought Content: Thought content normal.        Judgment: Judgment normal.          Assessment & Plan:   Prosthetic joint infection status post polyexchange of prosthesis with Enterobacter isolated:  Continue  levofloxacin monitoring for adverse effects including C. difficile colitis.  I will have him see me back in 2 months time.  If we run into problems with levofloxacin we will strongly consider taking him off his spironolactone and reinitiating Bactrim but of the lower single strength dose BID  Atrial fibrillation on amiodarone  QT prolongation: His QT is stable after initiation of fluoroquinolone on top of his amiodarone need to be very careful about any other drugs that prolong QT   CHF: followed by Cardiology  DM: monitored by PCP not had problems with BG post FQ  I spent more than 40 minutes with the patient including face to face counseling of the patient personally reviewing radiographs, along with pertinent laboratory microbiological, virological data review of medical records before and during the visit and in coordination of his care.

## 2020-11-01 ENCOUNTER — Other Ambulatory Visit: Payer: Self-pay | Admitting: Family Medicine

## 2020-11-04 ENCOUNTER — Other Ambulatory Visit: Payer: Self-pay | Admitting: Family Medicine

## 2020-11-16 ENCOUNTER — Other Ambulatory Visit (HOSPITAL_COMMUNITY): Payer: Self-pay

## 2020-11-18 ENCOUNTER — Ambulatory Visit: Payer: HMO | Admitting: Infectious Disease

## 2020-11-19 ENCOUNTER — Telehealth: Payer: Self-pay | Admitting: Pharmacist

## 2020-11-19 NOTE — Chronic Care Management (AMB) (Signed)
Chronic Care Management Pharmacy Assistant   Name: [name]  MRN: [id number] DOB: 09/04/1945  [name] is an 75 y.o. year old male who presents for his initial CCM visit with the clinical pharmacist.   Recent office visits:  09/04/20-Cody Zigmund Daniel, DO (PCP) Follow up visit. Freestyle Libre ordered. Follow up in 3 months. 08/07/20-Cody Zigmund Daniel, DO (PCP) Hypertension follow up. Start clotrimazole/betamethasone. Labs ordered. Follow up in 4 weeks. 06/15/20-Cody Zigmund Daniel, DO (PCP) Diabetic follow up. Follow up in 2 months.  Recent consult visits:  10/22/20-Cornelius N. Tommy Medal, MD (Infectious Disease) Follow up visit. Follow up in 2 months. 10/16/20-Cornelius N. Tommy Medal, MD (Infectious Disease) Follow up visit. Start on levofloxacin at 500 mg. 10/05/20-Mark Hazle Nordmann (Ophthalmology) Diabetic Eye exam. 09/18/20-Cornelius N. Tommy Medal, MD (Infectious Disease) Follow up visit. Labs ordered. Follow up in 2 months. 09/04/20-Sean Loanne Drilling, MD (Endocrinology) Diabetic follow up. Decrease Glimepiride 1 mg daily. 08/26/20-Cornelius N. Tommy Medal, MD (Infectious Disease) Follow up visit. Labs ordered. 07/03/20-Sean Loanne Drilling, MD (Endocrinology) Diabetic follow up. Reduce the glimepiride to 2 mg each morning. Follow up in 2 months. 06/26/20-Cornelius N. Tommy Medal, MD (Infectious Disease) Follow up visit. Labs ordered. Follow up in 2 months. 06/26/20-[name], [name] (Podiatry) Seen for bunion.  Hospital visits:  Medication Reconciliation was completed by comparing discharge summary, patient's EMR and Pharmacy list, and upon discussion with patient.  Admitted to the hospital on 08/01/20 due to Chest pain. Discharge date was 08/01/20. Discharged from [place]?Medications Started at [facility] [address] Discharge:?? -started None noted  Medication Changes at Hospital Discharge: -Changed None noted  Medications Discontinued at Hospital Discharge: -Stopped None  noted  Medications that remain the same after Hospital Discharge:??  -All other medications will remain the same.    Medications: Outpatient Encounter Medications as of 11/19/2020  Medication Sig   allopurinol (ZYLOPRIM) 100 MG tablet TAKE 1/2 TABLET BY MOUTH DAILY   amiodarone (PACERONE) 200 MG tablet Take 200 mg by mouth daily.   amLODipine (NORVASC) 5 MG tablet Take 1 tablet (5 mg total) by mouth daily.   Aspirin 81 MG CAPS Take 81 mg by mouth daily.   clotrimazole-betamethasone (LOTRISONE) cream APPLY 1 APPLICATION TOPICALLY DAILY   colchicine 0.6 MG tablet TAKE 1 TABLET BY MOUTH EVERY DAY   colesevelam (WELCHOL) 625 MG tablet Take 2 tablets (1,250 mg total) by mouth daily.   Continuous Blood Gluc Receiver (FREESTYLE LIBRE 2 READER) DEVI USE TO CHECK BLOOD GLUCOSE DAILY. E11.65   Continuous Blood Gluc Sensor (FREESTYLE LIBRE 14 DAY SENSOR) MISC 1 DEVICE EVERY 14 (FOURTEEN) DAYS.   cycloSPORINE (RESTASIS) 0.05 % ophthalmic emulsion Place 1 drop into both eyes 2 (two) times daily.    docusate sodium (COLACE) 100 MG capsule Take 100 mg by mouth daily as needed for mild constipation.   ferrous sulfate 325 (65 FE) MG tablet Take 325 mg by mouth daily with breakfast.   glimepiride (AMARYL) 1 MG tablet Take 1 tablet (1 mg total) by mouth daily with breakfast.   levofloxacin (LEVAQUIN) 500 MG tablet Take 1 tablet (500 mg total) by mouth daily.   metoprolol succinate (TOPROL-XL) 50 MG 24 hr tablet Take 50 mg by mouth daily.   omeprazole (PRILOSEC) 40 MG capsule TAKE 1 CAPSULE BY MOUTH TWICE A DAY FOR GERD   rosuvastatin (CRESTOR) 40 MG tablet Take 40 mg by mouth daily.   sitaGLIPtin (JANUVIA) 50 MG tablet Take 1 tablet (50 mg total) by mouth  daily.   spironolactone (ALDACTONE) 25 MG tablet TAKE 1 TABLET BY MOUTH EVERY DAY FOR EDEMA   No facility-administered encounter medications on file as of 11/19/2020.   Allopurinol (ZYLOPRIM) 100 MG tablet Last filled:11/05/20 90 DS Amiodarone (PACERONE)  200 MG tablet Last filled:11/10/20 90 DS AmLODipine (NORVASC) 5 MG tablet Last filled:11/04/20 90 DS Aspirin 81 MG CAPS Last filled:04/13/20 30 DS Clotrimazole-betamethasone (LOTRISONE) cream Last filled:08/07/20 60 DS Colchicine 0.6 MG tablet Last filled:10/14/20 90 DS Colesevelam (WELCHOL) 625 MG tablet Last filled:09/29/20 90 DS CycloSPORINE (RESTASIS) 0.05 % ophthalmic emulsion Last filled:04/13/20 90 DS Docusate sodium (COLACE) 100 MG capsule Last filled:04/13/20 30 DS Ferrous sulfate 325 (65 FE) MG tablet Last filled:04/13/20 30 DS Glimepiride (AMARYL) 1 MG tablet Last filled:09/04/20 90 DS Levofloxacin (LEVAQUIN) 500 MG tablet Last filled:11/16/20 30 DS Metoprolol succinate (TOPROL-XL) 50 MG 24 hr tablet Last filled:11/04/20 90 DS Omeprazole (PRILOSEC) 40 MG capsule Last filled:10/21/20 45 DS Rosuvastatin (CRESTOR) 40 MG tablet Last filled:09/19/20 90 DS SitaGLIPtin (JANUVIA) 50 MG tablet Last filled:07/17/20 90 DS Spironolactone (ALDACTONE) 25 MG tablet Last filled:09/10/20 90 DS   Star Rating Drugs: Rosuvastatin (CRESTOR) 40 MG tablet Last filled:09/19/20 90 DS SitaGLIPtin (JANUVIA) 50 MG tablet Last filled:07/17/20 90 DS  Myriam Elta [place], [place]

## 2020-11-30 ENCOUNTER — Telehealth: Payer: HMO

## 2020-11-30 ENCOUNTER — Emergency Department (HOSPITAL_COMMUNITY)
Admission: EM | Admit: 2020-11-30 | Discharge: 2020-11-30 | Disposition: A | Discharge status: Home / Self Care | Payer: HMO | Attending: Emergency Medicine | Admitting: Emergency Medicine

## 2020-11-30 ENCOUNTER — Other Ambulatory Visit: Payer: Self-pay

## 2020-11-30 ENCOUNTER — Encounter (HOSPITAL_COMMUNITY): Payer: Self-pay

## 2020-11-30 DIAGNOSIS — Z7982 Long term (current) use of aspirin: Secondary | ICD-10-CM | POA: Diagnosis not present

## 2020-11-30 DIAGNOSIS — I25119 Atherosclerotic heart disease of native coronary artery with unspecified angina pectoris: Secondary | ICD-10-CM | POA: Diagnosis not present

## 2020-11-30 DIAGNOSIS — N189 Chronic kidney disease, unspecified: Secondary | ICD-10-CM | POA: Insufficient documentation

## 2020-11-30 DIAGNOSIS — Z79899 Other long term (current) drug therapy: Secondary | ICD-10-CM | POA: Insufficient documentation

## 2020-11-30 DIAGNOSIS — Z8546 Personal history of malignant neoplasm of prostate: Secondary | ICD-10-CM | POA: Insufficient documentation

## 2020-11-30 DIAGNOSIS — Z87891 Personal history of nicotine dependence: Secondary | ICD-10-CM | POA: Diagnosis not present

## 2020-11-30 DIAGNOSIS — J45909 Unspecified asthma, uncomplicated: Secondary | ICD-10-CM | POA: Diagnosis not present

## 2020-11-30 DIAGNOSIS — I129 Hypertensive chronic kidney disease with stage 1 through stage 4 chronic kidney disease, or unspecified chronic kidney disease: Secondary | ICD-10-CM | POA: Insufficient documentation

## 2020-11-30 DIAGNOSIS — Z96642 Presence of left artificial hip joint: Secondary | ICD-10-CM | POA: Insufficient documentation

## 2020-11-30 DIAGNOSIS — R109 Unspecified abdominal pain: Secondary | ICD-10-CM

## 2020-11-30 DIAGNOSIS — R1032 Left lower quadrant pain: Secondary | ICD-10-CM | POA: Diagnosis not present

## 2020-11-30 DIAGNOSIS — R140 Abdominal distension (gaseous): Secondary | ICD-10-CM | POA: Insufficient documentation

## 2020-11-30 DIAGNOSIS — M79652 Pain in left thigh: Secondary | ICD-10-CM | POA: Insufficient documentation

## 2020-11-30 DIAGNOSIS — E1122 Type 2 diabetes mellitus with diabetic chronic kidney disease: Secondary | ICD-10-CM | POA: Insufficient documentation

## 2020-11-30 DIAGNOSIS — Z96652 Presence of left artificial knee joint: Secondary | ICD-10-CM | POA: Insufficient documentation

## 2020-11-30 DIAGNOSIS — Z7984 Long term (current) use of oral hypoglycemic drugs: Secondary | ICD-10-CM | POA: Insufficient documentation

## 2020-11-30 DIAGNOSIS — K219 Gastro-esophageal reflux disease without esophagitis: Secondary | ICD-10-CM

## 2020-11-30 LAB — URINALYSIS, ROUTINE W REFLEX MICROSCOPIC
Bilirubin Urine: NEGATIVE
Glucose, UA: NEGATIVE mg/dL
Hgb urine dipstick: NEGATIVE
Ketones, ur: NEGATIVE mg/dL
Leukocytes,Ua: NEGATIVE
Nitrite: NEGATIVE
Protein, ur: 100 mg/dL — AB
Specific Gravity, Urine: 1.021 (ref 1.005–1.030)
pH: 5 (ref 5.0–8.0)

## 2020-11-30 MED ORDER — DICYCLOMINE HCL 20 MG PO TABS: 20.0000 mg | ORAL_TABLET | Freq: Two times a day (BID) | ORAL | 0 refills | Status: DC

## 2020-11-30 NOTE — ED Triage Notes (Addendum)
Patient c/o left lower abdominal pain and bloating x 3 days. Patient reports a history of kidney stones and states the bloating is worse after eating.   Patient added that the pain radiates down into his left thigh area and is worse when walking.

## 2020-11-30 NOTE — ED Provider Notes (Signed)
[facility] DEPT Provider Note   CSN: [id number] Arrival date & time: 11/30/20  1110     History Chief Complaint  Patient presents with   Abdominal Pain   Bloated    [name] is a 75 y.o. male.  75 year old male presents with left anterior thigh pain which is atraumatic.  Also notes some feeling of bloating which gets worse after he eats.  Denies any fever or emesis.  No bowel or bladder dysfunction.  Took Tylenol today and feels much better.  Does have a history of kidney stones but denies any hematuria or dysuria.  Pain was worse with walking.  Denies any trauma.      Past Medical History:  Diagnosis Date   Arthritis    Asthma    no inhaler   Atrial fibrillation ([place]) 10/16/2020   Coronary artery disease    cardiologist-  dr spruill; last visit 3 mos ago per pt   Enterobacter sepsis ([place]) 04/13/2020   GERD (gastroesophageal reflux disease)    History of MI (myocardial infarction)    1985   Hydronephrosis, left    Hypertension    Myocardial infarction [facility])    Nephrolithiasis    left   Neuromuscular disorder (HCC)    TINGLING IN BOTH HANDS   Presence of tooth-root and mandibular implants    lower dental implants   Prostate cancer ([place])    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   QT prolongation 10/16/2020   Shortness of breath    WITH EXERTION   Thigh abscess 04/13/2020   Type 2 diabetes mellitus ([place])     Patient Active Problem List   Diagnosis Date Noted   Atrial fibrillation ([place]) 10/16/2020   QT prolongation 10/16/2020   Pruritus of groin in male 08/09/2020   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   Enterobacter sepsis ([place]) 04/13/2020   Thigh abscess 04/13/2020   CKD (chronic kidney disease) 04/13/2020   Bacteremia    SBO (small bowel obstruction) (HCC)    Abdominal distension    Effusion of hip joint, left    Hip pain 03/03/2020   Gout of right hand 02/23/2020   Hyperkalemia 01/14/2020   Hydroureteronephrosis  01/14/2020   Colon cancer screening 04/28/2019   Intermittent claudication ([place]) 01/22/2019   GERD (gastroesophageal reflux disease) 10/23/2018   Pseudophakia 08/11/2016   Uncontrolled type 2 diabetes mellitus with hyperglycemia, without long-term current use of insulin ([place]) 06/26/2016   Hyperlipidemia with target LDL less than 70 06/26/2016   Angina pectoris ([place]) 06/26/2016   Type 2 diabetes mellitus with hypoglycemia without coma ([place]) 06/24/2016   Essential hypertension 06/24/2016   CAD (coronary artery disease) 06/24/2016   Asthma 06/24/2016   Malignant neoplasm of prostate ([place]) 12/28/2015   OA (osteoarthritis) of hip 04/09/2012    Past Surgical History:  Procedure Laterality Date   BUNIONECTOMY     CYSTOSCOPY W/ RETROGRADES Left 03/14/2014   Procedure: CYSTOSCOPY WITH LEFT RETROGRADE PYELOGRAM, Left Ureteroscopy, Lweft ureteral Stent No string;  Surgeon: [name], MD;  Location: [facility];  Service: Urology;  Laterality: Left;   CYSTOSCOPY W/ URETERAL STENT PLACEMENT Bilateral 01/15/2020   Procedure: CYSTOSCOPY WITH RETROGRADE PYELOGRAM/URETERAL STENT PLACEMENT;  Surgeon: Alexis Frock, MD;  [facility];  Service: Urology;  Laterality: Bilateral;   CYSTOSCOPY/URETEROSCOPY/HOLMIUM LASER/STENT PLACEMENT Bilateral 03/17/2020   Procedure: CYSTOSCOPY/URETEROSCOPY/HOLMIUM LASER/STENT LEFT PLACEMENT;  Surgeon: Festus Aloe, MD;  [facility];  Service: Urology;  Laterality: Bilateral;   HERNIA REPAIR  INCISION AND DRAINAGE Left 03/09/2020   Procedure: INCISION AND DRAINAGE LEFT HIP WITH LINER EXCHANGE;  Surgeon: Gaynelle Arabian, MD;  [facility];  Service: Orthopedics;  Laterality: Left;   IR FLUORO GUIDE CV LINE RIGHT  03/10/2020   IR RADIOLOGIST EVAL & MGMT  04/01/2020   IR RADIOLOGIST EVAL & MGMT  04/15/2020   IR RADIOLOGIST EVAL & MGMT  04/30/2020   IR REMOVAL TUN CV CATH W/O FL  04/22/2020   IR US GUIDE BX ASP/DRAIN  03/17/2020   IR US GUIDE  VASC ACCESS RIGHT  03/10/2020   PROSTATE BIOPSY     TOTAL HIP ARTHROPLASTY Left 03-20-2009   TOTAL HIP ARTHROPLASTY  04/09/2012   Procedure: TOTAL HIP ARTHROPLASTY;  Surgeon: [name], MD;  [facility];  Service: Orthopedics;  Laterality: Right;   TOTAL KNEE ARTHROPLASTY Left 05-16-2008       Family History  Problem Relation Age of Onset   Cancer Mother        breast   Multiple sclerosis Daughter    Cancer Father        prostate   Diabetes Father    Cancer Maternal Uncle        bone    Social History   Tobacco Use   Smoking status: Former    Packs/day: 1.00    Years: 25.00    Pack years: 25.00    Types: Cigarettes    Quit date: 04/11/1984    Years since quitting: 36.6   Smokeless tobacco: Former    Quit date: 04/02/1985  Vaping Use   Vaping Use: Never used  Substance Use Topics   Alcohol use: Not Currently    Comment: RARE   Drug use: No    Home Medications Prior to Admission medications   Medication Sig Start Date End Date Taking? Authorizing Provider  allopurinol (ZYLOPRIM) 100 MG tablet TAKE 1/2 TABLET BY MOUTH DAILY 11/05/20   Luetta Nutting, DO  amiodarone (PACERONE) 200 MG tablet Take 200 mg by mouth daily. 05/18/20   [provider]  amLODipine (NORVASC) 5 MG tablet Take 1 tablet (5 mg total) by mouth daily. 08/07/20   Luetta Nutting, DO  Aspirin 81 MG CAPS Take 81 mg by mouth daily.    [provider]  clotrimazole-betamethasone (LOTRISONE) cream APPLY 1 APPLICATION TOPICALLY DAILY 11/05/20   Luetta Nutting, DO  colchicine 0.6 MG tablet TAKE 1 TABLET BY MOUTH EVERY DAY 07/16/20   Luetta Nutting, DO  colesevelam [facility]) 625 MG tablet Take 2 tablets (1,250 mg total) by mouth daily. 07/03/20   Renato Shin, MD  Continuous Blood Gluc Receiver (FREESTYLE LIBRE 2 READER) DEVI USE TO CHECK BLOOD GLUCOSE DAILY. E11.65 11/02/20   Luetta Nutting, DO  Continuous Blood Gluc Sensor (FREESTYLE LIBRE 14 DAY SENSOR) MISC 1 DEVICE EVERY 14 (FOURTEEN)  DAYS. 08/12/20   Luetta Nutting, DO  cycloSPORINE (RESTASIS) 0.05 % ophthalmic emulsion Place 1 drop into both eyes 2 (two) times daily.  09/23/19   [provider]  docusate sodium (COLACE) 100 MG capsule Take 100 mg by mouth daily as needed for mild constipation.    [provider]  ferrous sulfate 325 (65 FE) MG tablet Take 325 mg by mouth daily with breakfast.    [provider]  glimepiride (AMARYL) 1 MG tablet Take 1 tablet (1 mg total) by mouth daily with breakfast. 09/04/20   Renato Shin, MD  levofloxacin (LEVAQUIN) 500 MG tablet Take 1 tablet (500 mg total) by mouth  daily. 10/16/20   Tommy Medal, Lavell Islam, MD  metoprolol succinate (TOPROL-XL) 50 MG 24 hr tablet Take 50 mg by mouth daily. 08/09/20   [provider]  omeprazole (PRILOSEC) 40 MG capsule TAKE 1 CAPSULE BY MOUTH TWICE A DAY FOR GERD 11/05/20   Luetta Nutting, DO  rosuvastatin (CRESTOR) 40 MG tablet Take 40 mg by mouth daily.    [provider]  sitaGLIPtin (JANUVIA) 50 MG tablet Take 1 tablet (50 mg total) by mouth daily. 06/26/16   Orson Eva, MD  spironolactone (ALDACTONE) 25 MG tablet TAKE 1 TABLET BY MOUTH EVERY DAY FOR EDEMA 09/10/20   Luetta Nutting, DO    Allergies    Shellfish allergy  Review of Systems   Review of Systems  All other systems reviewed and are negative.  Physical Exam Updated Vital Signs BP (!) 146/72 (BP Location: Left Arm)   Pulse 67   Temp 98.1 F (36.7 C) (Oral)   Resp 16   Ht 1.88 m ('6\' 2"'$ )   Wt 108.9 kg   SpO2 100%   BMI 30.81 kg/m   Physical Exam Vitals and nursing note reviewed.  Constitutional:      General: He is not in acute distress.    Appearance: Normal appearance. He is well-developed. He is not toxic-appearing.  HENT:     Head: Normocephalic and atraumatic.  Eyes:     General: Lids are normal.     Conjunctiva/sclera: Conjunctivae normal.     Pupils: Pupils are equal, round, and reactive to light.  Neck:     Thyroid: No thyroid  mass.     Trachea: No tracheal deviation.  Cardiovascular:     Rate and Rhythm: Normal rate and regular rhythm.     Heart sounds: Normal heart sounds. No murmur heard.   No gallop.  Pulmonary:     Effort: Pulmonary effort is normal. No respiratory distress.     Breath sounds: Normal breath sounds. No stridor. No decreased breath sounds, wheezing, rhonchi or rales.  Abdominal:     General: There is no distension.     Palpations: Abdomen is soft.     Tenderness: There is no abdominal tenderness. There is no rebound.  Musculoskeletal:        General: No tenderness. Normal range of motion.     Cervical back: Normal range of motion and neck supple.     Comments: No hernias appreciated in the patient's left inguinal canal.  Full range of motion at the patient's left hip.  No obvious lesions noted  Skin:    General: Skin is warm and dry.     Findings: No abrasion or rash.  Neurological:     Mental Status: He is alert and oriented to person, place, and time. Mental status is at baseline.     GCS: GCS eye subscore is 4. GCS verbal subscore is 5. GCS motor subscore is 6.     Cranial Nerves: Cranial nerves are intact. No cranial nerve deficit.     Sensory: No sensory deficit.     Motor: Motor function is intact.  Psychiatric:        Attention and Perception: Attention normal.        Speech: Speech normal.        Behavior: Behavior normal.    ED Results / Procedures / Treatments   Labs (all labs ordered are listed, but only abnormal results are displayed) Labs Reviewed  URINALYSIS, ROUTINE W REFLEX MICROSCOPIC    EKG  None  Radiology No results found.  Procedures Procedures   Medications Ordered in ED Medications - No data to display  ED Course  I have reviewed the triage vital signs and the nursing notes.  Pertinent labs & imaging results that were available during my care of the patient were reviewed by me and considered in my medical decision making (see chart for  details).    MDM Rules/Calculators/A&P                           Urinalysis negative for infection.  Patient requesting senna for indigestion.  Will discharge home Final Clinical Impression(s) / ED Diagnoses Final diagnoses:  None    Rx / DC Orders ED Discharge Orders     None        Lacretia Leigh, MD 11/30/20 1231

## 2020-12-01 ENCOUNTER — Other Ambulatory Visit: Payer: Self-pay | Admitting: Family Medicine

## 2020-12-01 ENCOUNTER — Telehealth: Payer: Self-pay

## 2020-12-01 NOTE — Telephone Encounter (Signed)
Transition Care Management Follow-up Telephone Call Date of discharge and from where: 11/30/2020 from [facility] How have you been since you were released from the hospital? Pt stated that he is feeling better. Pt did not have any questions or concerns at this time.  Any questions or concerns? No  Items Reviewed: Did the pt receive and understand the discharge instructions provided? Yes  Medications obtained and verified? Yes  Other? No  Any new allergies since your discharge? No  Dietary orders reviewed? No Do you have support at home? Yes   Functional Questionnaire: (I = Independent and D = Dependent) ADLs: I  Bathing/Dressing- I  Meal Prep- I  Eating- I  Maintaining continence- I  Transferring/Ambulation- I  Managing Meds- I   Follow up appointments reviewed:  PCP Hospital f/u appt confirmed? Yes  Scheduled to see Luetta Nutting, DO on 12/07/2020 @ 9:10am. [facility] f/u appt confirmed? Yes  Scheduled to see Renato Shin, MD on 12/02/2020 @ 8:00am. Are transportation arrangements needed? No  If their condition worsens, is the pt aware to call PCP or go to the Emergency Dept.? Yes Was the patient provided with contact information for the PCP's office or ED? Yes Was to pt encouraged to call back with questions or concerns? Yes

## 2020-12-02 ENCOUNTER — Other Ambulatory Visit: Payer: Self-pay

## 2020-12-02 ENCOUNTER — Ambulatory Visit (INDEPENDENT_AMBULATORY_CARE_PROVIDER_SITE_OTHER): Payer: HMO | Admitting: Endocrinology

## 2020-12-02 VITALS — BP 142/78 | HR 69 | Ht 74.0 in | Wt 227.2 lb

## 2020-12-02 DIAGNOSIS — E11649 Type 2 diabetes mellitus with hypoglycemia without coma: Secondary | ICD-10-CM

## 2020-12-02 LAB — POCT GLYCOSYLATED HEMOGLOBIN (HGB A1C): Hemoglobin A1C: 5.9 % — AB (ref 4.0–5.6)

## 2020-12-02 MED ORDER — GLIMEPIRIDE 1 MG PO TABS: 0.5000 mg | ORAL_TABLET | Freq: Every day | ORAL | 3 refills | Status: DC

## 2020-12-02 NOTE — Patient Instructions (Addendum)
check your blood sugar once a day.  vary the time of day when you check, between before the 3 meals, and at bedtime.  also check if you have symptoms of your blood sugar being too high or too low.  please keep a record of the readings and bring it to your next appointment here (or you can bring the meter itself).  You can write it on any piece of paper.  please call us sooner if your blood sugar goes below 70, or if most of your readings are over 200. I have sent a prescription to your pharmacy, to reduce the glimepiride to 1/2 of 1 mg each morning, and:  Please continue the same other medications.   Please come back for a follow-up appointment in 3 months.

## 2020-12-02 NOTE — Progress Notes (Signed)
Subjective:    Patient ID: [name], male    DOB: [birth year], 75 y.o.   MRN: [id number]  HPI Pt returns for f/u of diabetes mellitus: DM type: 2 Dx'ed: [id number] Complications: CAD and CRI Therapy: 3 oral meds DKA: never Severe hypoglycemia: never Pancreatitis: never Pancreatic imaging: normal on 2021 CT SDOH: he gets Januvia samples from Dr [name]--- as he cannot afford to buy it Other: he has never been on insulin; he has FL continuous glucose monitor Interval history:  I reviewed continuous glucose monitor data.  Glucose varies from 40-160, but there are large gaps in data.  He takes meds as rx'ed.  pt states he feels well in general. 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   QT prolongation 10/16/2020   Shortness of breath    WITH EXERTION   Thigh abscess 04/13/2020   Type 2 diabetes mellitus ([place])     Past Surgical History:  Procedure Laterality Date   BUNIONECTOMY     CYSTOSCOPY W/ RETROGRADES Left 03/14/2014   Procedure: CYSTOSCOPY WITH LEFT RETROGRADE PYELOGRAM, Left Ureteroscopy, Lweft ureteral Stent No string;  Surgeon: [name], MD;  Location: [place];  Service: Urology;  Laterality: Left;   CYSTOSCOPY W/ URETERAL STENT PLACEMENT Bilateral 01/15/2020   Procedure: CYSTOSCOPY WITH RETROGRADE PYELOGRAM/URETERAL STENT PLACEMENT;  Surgeon: Alexis Frock, MD;  Location:  [facility];  Service: Urology;  Laterality: Bilateral;   CYSTOSCOPY/URETEROSCOPY/HOLMIUM LASER/STENT PLACEMENT Bilateral 03/17/2020   Procedure: CYSTOSCOPY/URETEROSCOPY/HOLMIUM LASER/STENT LEFT PLACEMENT;  Surgeon: Festus Aloe, MD;  [facility];  Service: Urology;  Laterality: Bilateral;   HERNIA REPAIR     INCISION AND DRAINAGE Left 03/09/2020   Procedure: INCISION AND DRAINAGE LEFT HIP WITH LINER EXCHANGE;  Surgeon: Gaynelle Arabian, MD;  [facility];  Service: Orthopedics;  Laterality: Left;   IR FLUORO GUIDE CV LINE RIGHT  03/10/2020   IR RADIOLOGIST EVAL & MGMT  04/01/2020   IR RADIOLOGIST EVAL & MGMT  04/15/2020   IR RADIOLOGIST EVAL & MGMT  04/30/2020   IR REMOVAL TUN CV CATH W/O FL  04/22/2020   IR US GUIDE BX ASP/DRAIN  03/17/2020   IR US GUIDE VASC ACCESS RIGHT  03/10/2020   PROSTATE BIOPSY     TOTAL HIP ARTHROPLASTY Left 03-20-2009   TOTAL HIP ARTHROPLASTY  04/09/2012   Procedure: TOTAL HIP ARTHROPLASTY;  Surgeon: [name], MD;  [facility];  Service: Orthopedics;  Laterality: Right;   TOTAL KNEE ARTHROPLASTY Left 05-16-2008    Social History   Socioeconomic History   Marital status: Widowed    Spouse name: Not on file   Number of children: Not on file   Years of education: Not on file   Highest education level: Not on file  Occupational History  Not on file  Tobacco Use   Smoking status: Former    Packs/day: 1.00    Years: 25.00    Pack years: 25.00    Types: Cigarettes    Quit date: 04/11/1984    Years since quitting: 36.6   Smokeless tobacco: Former    Quit date: 04/02/1985  Vaping Use   Vaping Use: Never used  Substance and Sexual Activity   Alcohol use: Not Currently    Comment: RARE   Drug use: No   Sexual activity: Not Currently  Other Topics Concern   Not on file  Social History Narrative   Not on file   Social Determinants of Health   Financial Resource Strain: Not on file  Food Insecurity: Not on file  Transportation Needs:  Not on file  Physical Activity: Not on file  Stress: Not on file  Social Connections: Not on file  Intimate Partner Violence: Not on file    Current Outpatient Medications on File Prior to Visit  Medication Sig Dispense Refill   allopurinol (ZYLOPRIM) 100 MG tablet TAKE 1/2 TABLET BY MOUTH DAILY (Patient taking differently: Take 50 mg by mouth daily.) 45 tablet 1   amiodarone (PACERONE) 200 MG tablet Take 200 mg by mouth daily.     amLODipine (NORVASC) 5 MG tablet Take 1 tablet (5 mg total) by mouth daily. 90 tablet 3   Aspirin 81 MG CAPS Take 81 mg by mouth daily.     clotrimazole-betamethasone (LOTRISONE) cream APPLY 1 APPLICATION TOPICALLY DAILY 30 g 0   colchicine 0.6 MG tablet TAKE 1 TABLET BY MOUTH EVERY DAY (Patient taking differently: Take 0.6 mg by mouth daily.) 90 tablet 1   colesevelam (WELCHOL) 625 MG tablet Take 2 tablets (1,250 mg total) by mouth daily. 180 tablet 3   Continuous Blood Gluc Receiver (FREESTYLE LIBRE 2 READER) DEVI USE TO CHECK BLOOD GLUCOSE DAILY. E11.65 1 each 0   Continuous Blood Gluc Sensor (FREESTYLE LIBRE 14 DAY SENSOR) MISC 1 DEVICE EVERY 14 (FOURTEEN) DAYS. 6 each 1   cycloSPORINE (RESTASIS) 0.05 % ophthalmic emulsion Place 1 drop into both eyes 2 (two) times daily.      dicyclomine (BENTYL) 20 MG tablet Take 1 tablet (20 mg total) by mouth 2 (two) times daily. 20 tablet 0   docusate sodium (COLACE) 100 MG capsule Take 100 mg by mouth daily as needed for mild constipation.     ferrous sulfate 325 (65 FE) MG tablet Take 325 mg by mouth daily with breakfast.     levofloxacin (LEVAQUIN) 500 MG tablet Take 1 tablet (500 mg total) by mouth daily. 30 tablet 11   metoprolol succinate (TOPROL-XL) 50 MG 24 hr tablet Take 50 mg by mouth daily.     omeprazole (PRILOSEC) 40 MG capsule Take 1 capsule (40 mg total) by mouth in the morning and at bedtime. 180 capsule 0   rosuvastatin (CRESTOR) 40 MG tablet Take 40 mg by mouth daily.     sitaGLIPtin (JANUVIA) 50 MG  tablet Take 1 tablet (50 mg total) by mouth daily. 30 tablet 0   spironolactone (ALDACTONE) 25 MG tablet TAKE 1 TABLET BY MOUTH EVERY DAY FOR EDEMA (Patient taking differently: Take 25 mg by mouth daily.) 90 tablet 1   No current facility-administered medications on file prior to visit.    Allergies  Allergen Reactions   Shellfish Allergy Rash    Family History  Problem Relation Age of Onset   Cancer Mother  breast   Multiple sclerosis Daughter    Cancer Father        prostate   Diabetes Father    Cancer Maternal Uncle        bone    BP (!) 142/78 (BP Location: Right Arm, Patient Position: Sitting, Cuff Size: Large)   Pulse 69   Ht '6\' 2"'$  (1.88 m)   Wt 227 lb 3.2 oz (103.1 kg)   SpO2 96%   BMI 29.17 kg/m    Review of Systems     Objective:   Physical Exam Pulses: dorsalis pedis intact bilat.   MSK: no deformity of the feet, except for large right bunion.   CV: no leg edema Skin:  no ulcer on the feet.  normal color and temp on the feet. Neuro: sensation is intact to touch on the feet Ext: there is bilateral onychomycosis of the toenails  Lab Results  Component Value Date   CREATININE 1.22 (H) 10/16/2020   BUN 20 10/16/2020   NA 139 10/16/2020   K 4.5 10/16/2020   CL 110 10/16/2020   CO2 21 10/16/2020    A1c=5.9%    Assessment & Plan:  Type 2 DM: overcontrolled  Patient Instructions  check your blood sugar once a day.  vary the time of day when you check, between before the 3 meals, and at bedtime.  also check if you have symptoms of your blood sugar being too high or too low.  please keep a record of the readings and bring it to your next appointment here (or you can bring the meter itself).  You can write it on any piece of paper.  please call us sooner if your blood sugar goes below 70, or if most of your readings are over 200. I have sent a prescription to your pharmacy, to reduce the glimepiride to 1/2 of 1 mg each morning, and:  Please continue  the same other medications.   Please come back for a follow-up appointment in 3 months.

## 2020-12-07 ENCOUNTER — Encounter: Payer: Self-pay | Admitting: Family Medicine

## 2020-12-07 ENCOUNTER — Other Ambulatory Visit: Payer: Self-pay

## 2020-12-07 ENCOUNTER — Ambulatory Visit (INDEPENDENT_AMBULATORY_CARE_PROVIDER_SITE_OTHER): Payer: HMO | Admitting: Family Medicine

## 2020-12-07 VITALS — BP 172/68 | HR 68 | Ht 74.0 in | Wt 224.0 lb

## 2020-12-07 DIAGNOSIS — T8459XD Infection and inflammatory reaction due to other internal joint prosthesis, subsequent encounter: Secondary | ICD-10-CM | POA: Diagnosis not present

## 2020-12-07 DIAGNOSIS — I10 Essential (primary) hypertension: Secondary | ICD-10-CM

## 2020-12-07 DIAGNOSIS — R140 Abdominal distension (gaseous): Secondary | ICD-10-CM | POA: Diagnosis not present

## 2020-12-07 DIAGNOSIS — N183 Chronic kidney disease, stage 3 unspecified: Secondary | ICD-10-CM

## 2020-12-07 DIAGNOSIS — Z96649 Presence of unspecified artificial hip joint: Secondary | ICD-10-CM | POA: Diagnosis not present

## 2020-12-07 MED ORDER — SPIRONOLACTONE 25 MG PO TABS: 25.0000 mg | ORAL_TABLET | Freq: Every day | ORAL | 1 refills | Status: DC | PRN

## 2020-12-07 MED ORDER — AMLODIPINE BESYLATE 10 MG PO TABS: 10.0000 mg | ORAL_TABLET | Freq: Every day | ORAL | 1 refills | Status: DC

## 2020-12-07 NOTE — Assessment & Plan Note (Signed)
Management per infectious disease and orthopedics.  He is on prolonged antibiotics currently on Levaquin.

## 2020-12-07 NOTE — Assessment & Plan Note (Signed)
Renal function stable on recent lab work.

## 2020-12-07 NOTE — Patient Instructions (Signed)
Try adding senokot-s or dulcolax for constipation.  Be sure to stay well hydrated.  Continue probiotic.  Let me know if symptoms are not improving.   Increase amlodipine to '10mg'$  daily.  See me again in 4 months.

## 2020-12-07 NOTE — Assessment & Plan Note (Signed)
He has had some constipation and prolonged antibiotics are likely contributing.  Recommend using Senokot-S and/or Dulcolax for constipation.  Restart probiotic.

## 2020-12-07 NOTE — Progress Notes (Signed)
[name] - 75 y.o. male MRN [id number]  Date of birth: 1945/11/05  Subjective Chief Complaint  Patient presents with   Hypertension    HPI [name] is a 75 year old male here today for follow-up of hypertension.  He continues on antibiotics for left hip infection.  This is being managed by infectious disease.  He is currently on Levaquin as his previous antibiotics were too expensive.  He was recently prescribed amoxicillin as well by his dentist due to a dental infection.  He does report some GI upset including bloating and constipation.  His appetite has been normal and he denies any nausea.  He has seen a GI provider while in the hospital recently.  He was advised to try adding a probiotic.  He has been using a stool softener but has not had great results with this.    He is taking medications as directed for management of hypertension.  He denies any symptoms related to hypertension including chest pain, shortness of breath, palpitations, headache or vision changes.  ROS:  A comprehensive ROS was completed and negative except as noted per HPI  Allergies  Allergen Reactions   Shellfish Allergy Rash    Past Medical History:  Diagnosis Date   Arthritis    Asthma    no inhaler   Atrial fibrillation ([name]) 10/16/2020   Coronary artery disease    cardiologist-  dr spruill; last visit 3 mos ago per pt   Enterobacter sepsis ([name]) 04/13/2020   GERD (gastroesophageal reflux disease)    History of MI (myocardial infarction)    1985   Hydronephrosis, left    Hypertension    Myocardial infarction ([name])    Nephrolithiasis    left   Neuromuscular disorder (HCC)    TINGLING IN BOTH HANDS   Presence of tooth-root and mandibular implants    lower dental implants   Prostate cancer ([name])    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   QT prolongation 10/16/2020   Shortness of breath    WITH EXERTION   Thigh abscess 04/13/2020   Type 2 diabetes mellitus ([name])     Past Surgical History:   Procedure Laterality Date   BUNIONECTOMY     CYSTOSCOPY W/ RETROGRADES Left 03/14/2014   Procedure: CYSTOSCOPY WITH LEFT RETROGRADE PYELOGRAM, Left Ureteroscopy, [place] ureteral Stent No string;  Surgeon: [name], MD;  Location: [facility] [place];  Service: Urology;  Laterality: Left;   CYSTOSCOPY W/ URETERAL STENT PLACEMENT Bilateral 01/15/2020   Procedure: CYSTOSCOPY WITH RETROGRADE PYELOGRAM/URETERAL STENT PLACEMENT;  Surgeon: Alexis Frock, MD;  Location: [name];  Service: Urology;  Laterality: Bilateral;   CYSTOSCOPY/URETEROSCOPY/HOLMIUM LASER/STENT PLACEMENT Bilateral 03/17/2020   Procedure: CYSTOSCOPY/URETEROSCOPY/HOLMIUM LASER/STENT LEFT PLACEMENT;  Surgeon: Festus Aloe, MD;  Location: [name];  Service: Urology;  Laterality: Bilateral;   HERNIA REPAIR     INCISION AND DRAINAGE Left 03/09/2020   Procedure: INCISION AND DRAINAGE LEFT HIP WITH LINER EXCHANGE;  Surgeon: Gaynelle Arabian, MD;  Location: [name];  Service: Orthopedics;  Laterality: Left;   IR FLUORO GUIDE CV LINE RIGHT  03/10/2020   IR RADIOLOGIST EVAL & MGMT  04/01/2020   IR RADIOLOGIST EVAL & MGMT  04/15/2020   IR RADIOLOGIST EVAL & MGMT  04/30/2020   IR REMOVAL TUN CV CATH W/O FL  04/22/2020   IR US GUIDE BX ASP/DRAIN  03/17/2020   IR US GUIDE VASC ACCESS RIGHT  03/10/2020   PROSTATE BIOPSY     TOTAL HIP ARTHROPLASTY Left 03-20-2009  TOTAL HIP ARTHROPLASTY  04/09/2012   Procedure: TOTAL HIP ARTHROPLASTY;  Surgeon: Aaron Alf, MD;  Location: [name];  Service: Orthopedics;  Laterality: Right;   TOTAL KNEE ARTHROPLASTY Left 05-16-2008    Social History   Socioeconomic History   Marital status: Widowed    Spouse name: Not on file   Number of children: Not on file   Years of education: Not on file   Highest education level: Not on file  Occupational History   Not on file  Tobacco Use   Smoking status: Former    Packs/day: 1.00    Years: 25.00    Pack years: 25.00    Types: Cigarettes    Quit  date: 04/11/1984    Years since quitting: 36.6   Smokeless tobacco: Former    Quit date: 04/02/1985  Vaping Use   Vaping Use: Never used  Substance and Sexual Activity   Alcohol use: Not Currently    Comment: RARE   Drug use: No   Sexual activity: Not Currently  Other Topics Concern   Not on file  Social History Narrative   Not on file   Social Determinants of Health   Financial Resource Strain: Not on file  Food Insecurity: Not on file  Transportation Needs: Not on file  Physical Activity: Not on file  Stress: Not on file  Social Connections: Not on file    Family History  Problem Relation Age of Onset   Cancer Mother        breast   Multiple sclerosis Daughter    Cancer Father        prostate   Diabetes Father    Cancer Maternal Uncle        bone    Health Maintenance  Topic Date Due   Zoster Vaccines- Shingrix (1 of 2) Never done   URINE MICROALBUMIN  10/23/2019   COVID-19 Vaccine (3 - Moderna risk series) 04/15/2020   INFLUENZA VACCINE  11/09/2020   HEMOGLOBIN A1C  06/04/2021   OPHTHALMOLOGY EXAM  10/05/2021   FOOT EXAM  12/02/2021   TETANUS/TDAP  04/10/2025   COLONOSCOPY (Pts 45-86yr Insurance coverage will need to be confirmed)  11/15/2028   Hepatitis C Screening  Completed   PNA vac Low Risk Adult  Completed   HPV VACCINES  Aged Out     ----------------------------------------------------------------------------------------------------------------------------------------------------------------------------------------------------------------- Physical Exam BP (!) 172/68 (BP Location: Left Arm, Patient Position: Sitting, Cuff Size: Large)   Pulse 68   Ht '6\' 2"'$  (1.88 m)   Wt 224 lb (101.6 kg)   SpO2 98%   BMI 28.76 kg/m   Physical Exam Constitutional:      Appearance: Normal appearance.  HENT:     [name]: Normocephalic and atraumatic.  Eyes:     General: No scleral icterus. Cardiovascular:     Rate and Rhythm: Normal rate and regular rhythm.   Pulmonary:     Effort: Pulmonary effort is normal.     Breath sounds: Normal breath sounds.  Musculoskeletal:     Cervical back: Neck supple.  Neurological:     Mental Status: He is alert.  Psychiatric:        Mood and Affect: Mood normal.        Behavior: Behavior normal.    ------------------------------------------------------------------------------------------------------------------------------------------------------------------------------------------------------------------- Assessment and Plan  Prosthetic hip infection ([place] [facility] per infectious disease and orthopedics.  He is on prolonged antibiotics currently on Levaquin.  CKD (chronic kidney disease) Renal function stable on recent lab work.  Abdominal bloating  He has had some constipation and prolonged antibiotics are likely contributing.  Recommend using Senokot-S and/or Dulcolax for constipation.  Restart probiotic.  Essential hypertension Blood pressure elevated today.  Increasing amlodipine to 10 mg daily. Return in about 4 months (around 04/08/2021) for HTN.   Meds ordered this encounter  Medications   spironolactone (ALDACTONE) 25 MG tablet    Sig: Take 1 tablet (25 mg total) by mouth daily as needed. TAKE 1 TABLET BY MOUTH EVERY DAY FOR EDEMA    Dispense:  90 tablet    Refill:  1   amLODipine (NORVASC) 10 MG tablet    Sig: Take 1 tablet (10 mg total) by mouth daily.    Dispense:  90 tablet    Refill:  1    Return in about 4 months (around 04/08/2021) for HTN.    This visit occurred during the SARS-CoV-2 public health emergency.  Safety protocols were in place, including screening questions prior to the visit, additional usage of staff PPE, and extensive cleaning of exam room while observing appropriate contact time as indicated for disinfecting solutions.

## 2020-12-07 NOTE — Assessment & Plan Note (Signed)
Blood pressure elevated today.  Increasing amlodipine to 10 mg daily. Return in about 4 months (around 04/08/2021) for HTN.

## 2020-12-15 DIAGNOSIS — Z8546 Personal history of malignant neoplasm of prostate: Secondary | ICD-10-CM | POA: Diagnosis not present

## 2020-12-16 ENCOUNTER — Other Ambulatory Visit (HOSPITAL_COMMUNITY): Payer: Self-pay

## 2020-12-21 DIAGNOSIS — I480 Paroxysmal atrial fibrillation: Secondary | ICD-10-CM | POA: Diagnosis not present

## 2020-12-21 DIAGNOSIS — Z8546 Personal history of malignant neoplasm of prostate: Secondary | ICD-10-CM | POA: Diagnosis not present

## 2020-12-21 DIAGNOSIS — N130 Hydronephrosis with ureteropelvic junction obstruction: Secondary | ICD-10-CM | POA: Diagnosis not present

## 2020-12-21 DIAGNOSIS — I25118 Atherosclerotic heart disease of native coronary artery with other forms of angina pectoris: Secondary | ICD-10-CM | POA: Diagnosis not present

## 2020-12-21 DIAGNOSIS — M009 Pyogenic arthritis, unspecified: Secondary | ICD-10-CM | POA: Diagnosis not present

## 2020-12-21 DIAGNOSIS — E119 Type 2 diabetes mellitus without complications: Secondary | ICD-10-CM | POA: Diagnosis not present

## 2020-12-24 ENCOUNTER — Ambulatory Visit (INDEPENDENT_AMBULATORY_CARE_PROVIDER_SITE_OTHER): Payer: HMO | Admitting: Pharmacist

## 2020-12-24 ENCOUNTER — Other Ambulatory Visit: Payer: Self-pay

## 2020-12-24 DIAGNOSIS — I4891 Unspecified atrial fibrillation: Secondary | ICD-10-CM

## 2020-12-24 DIAGNOSIS — E785 Hyperlipidemia, unspecified: Secondary | ICD-10-CM

## 2020-12-24 DIAGNOSIS — E1165 Type 2 diabetes mellitus with hyperglycemia: Secondary | ICD-10-CM

## 2020-12-24 DIAGNOSIS — I10 Essential (primary) hypertension: Secondary | ICD-10-CM

## 2020-12-24 NOTE — Patient Instructions (Signed)
Visit Information   PATIENT GOALS:   Goals Addressed             This Visit's Progress    Medication Management       Patient Goals/Self-Care Activities Over the next 14 days, patient will:  take medications as prescribed and check blood pressure daily, document, and provide at future appointments  Follow Up Plan: Telephone follow up appointment with care management team member scheduled for:  2 weeks         Consent to CCM Services: [name] was given information about Chronic Care Management services including:  CCM service includes personalized support from designated clinical staff supervised by his physician, including individualized plan of care and coordination with other care providers 24/7 contact phone numbers for assistance for urgent and routine care needs. Service will only be billed when office clinical staff spend 20 minutes or more in a month to coordinate care. Only one practitioner may furnish and bill the service in a calendar month. The patient may stop CCM services at any time (effective at the end of the month) by phone call to the office staff. The patient will be responsible for cost sharing (co-pay) of up to 20% of the service fee (after annual deductible is met).  Patient agreed to services and verbal consent obtained.   Patient verbalizes understanding of instructions provided today and agrees to view in [place].   Telephone follow up appointment with care management team member scheduled for: 2 weeks  [name]   CLINICAL CARE PLAN: Patient Care Plan: Medication Management     Problem Identified: HTN, HLD, DM, Afib      Long-Range Goal: Disease Progression Prevention   Start Date: 12/24/2020  This Visit's Progress: On track  Priority: High  Note:   Current Barriers:  None at present   Pharmacist Clinical Goal(s):  Over the next 14 days, patient will adhere to plan to optimize therapeutic regimen for hypertension as evidenced by  report of adherence to recommended medication management changes through collaboration with PharmD and provider.   Interventions: 1:1 collaboration with Luetta Nutting, DO regarding development and update of comprehensive plan of care as evidenced by provider attestation and co-signature Inter-disciplinary care team collaboration (see longitudinal plan of care) Comprehensive medication review performed; medication list updated in electronic medical record  Diabetes:  Controlled; current treatment:glimepiride 0.74m daily, sitagliptin 549mdaily; a1c 5.9  Current glucose readings: fasting glucose: 90s- 130s, post prandial glucose: 109  Denies hypoglycemic/hyperglycemic symptoms  Current meal patterns: breakfast: oatmeal or egg/sausage; lunch: burger or ham sandwich w/chips; dinner: greens & slaw with fish or chicken, or pork & beansc; snacks: crackers or chips or cookies; drinks: mostly water, sprite, apple juice  Current exercise: yardwork, [facility]  Counseled on lifestyle modifications and opportunities to improve diet  Recommended he carry his CGM reader with him in order to obtain more readings, continue current regimen,  Hypertension:  Uncontrolled; current treatment:amlodipine 1072maily (just started higher dose today from 5mg26mtoprol XL 25mg59mly, spironolactone 25mg 75m   Current home readings: 166/82, 164/85, 178/80, 188/93, 179/84, 188/78 -> 163/76 during our call  Denies/reports hypotensive/hypertensive symptoms  Counseled on importance of BP control Recommended continue current regimen, check BP at home, will call patient in 2 weeks to assess medication dose change,  Hyperlipidemia:  Controlled; current treatment:rosuvastatin 40mg d78m; LDL 97  Recommended continue current regimen Atrial Fibrillation:  Controlled; current rate/rhythm control: amiodarone 200mg16m  daily; anticoagulant treatment: not on, did not know why  Recommended continue current  regimen, ongoing investigation on risk/benefit ratio for anticoagulation  Patient Goals/Self-Care Activities Over the next 14 days, patient will:  take medications as prescribed and check blood pressure daily, document, and provide at future appointments  Follow Up Plan: Telephone follow up appointment with care management team member scheduled for:  2 weeks

## 2020-12-24 NOTE — Progress Notes (Signed)
Chronic Care Management Pharmacy Note  12/24/2020 Name:  [name] MRN:  [id number] DOB:  10-Jul-1945  Summary: addressed DM, HTN, HLD, Afib. Patient just started dose increase of amlodipine 38m to 139m SBP 160-180s.  Recommendations/Changes made from today's visit: Patient to carry CGM reader with him to obtain more BG readings throughout the day, as well as continue checking BP and documenting for discussion at next visit.Also will continue investigating risk/benefit ratio for anticoagulation for afib.  Plan: f/u with pharmacist in 2 weeks.  Subjective: Aaron COLLYMOREs an [age].o. year old male who is a primary patient of [name].  The CCM team was consulted for assistance with disease management and care coordination needs.    Engaged with patient by telephone for initial visit in response to provider referral for pharmacy case management and/or care coordination services.   Consent to Services:  The patient was given information about Chronic Care Management services, agreed to services, and gave verbal consent prior to initiation of services.  Please see initial visit note for detailed documentation.   Patient Care Team: [name] as PCP - General (Family Medicine) HaCharolette ForwardMD as Consulting Physician (Cardiology) [facility] Pharmacist (Pharmacist)  Recent office visits:  09/04/20-[name] (PCP) Follow up visit. Freestyle Libre ordered. Follow up in 3 months. 08/07/20-[name] (PCP) Hypertension follow up. Start clotrimazole/betamethasone. Labs ordered. Follow up in 4 weeks. 06/15/20-[name] (PCP) Diabetic follow up. Follow up in 2 months.   Recent consult visits:  10/22/20-[name] (Infectious Disease) Follow up visit. Follow up in 2 months. 10/16/20-[name] (Infectious Disease) Follow up visit. Start on levofloxacin at 500 mg. 10/05/20-[name](Ophthalmology) Diabetic Eye exam. 09/18/20-[name] (Infectious Disease) Follow up visit. Labs ordered. Follow up in 2 months. 09/04/20-[name] (Endocrinology) Diabetic follow up. Decrease Glimepiride 1 mg daily. 08/26/20-[name] (Infectious Disease) Follow up visit. Labs ordered. 07/03/20-[name] (Endocrinology) Diabetic follow up. Reduce the glimepiride to 2 mg each morning. Follow up in 2 months. 06/26/20-[name] (Infectious Disease) Follow up visit. Labs ordered. Follow up in 2 months. 06/26/20-[name], [name] (Podiatry) Seen for bunion.   Hospital visits:  Medication Reconciliation was completed by comparing discharge summary, patient's EMR and Pharmacy list, and upon discussion with patient.   Admitted to the hospital on 08/01/20 due to Chest pain. Discharge date was 08/01/20. [facility] -started None noted   Medication Changes at Hospital Discharge: -Changed None noted   Medications Discontinued at Hospital Discharge: -Stopped None noted   Medications that remain the same after Hospital Discharge:??  -All other medications will remain the same.    Objective:  Lab Results  Component Value Date   CREATININE 1.22 (H) 10/16/2020   CREATININE 1.21 (H) 09/18/2020   CREATININE 1.47 (H) 08/26/2020    Lab Results  Component Value Date   HGBA1C 5.9 (A) 12/02/2020   Last diabetic Eye exam:  Lab Results  Component Value Date/Time   HMDIABEYEEXA No Retinopathy 10/05/2020 12:00 AM    Last diabetic Foot exam: No results found for: HMDIABFOOTEX      Component Value Date/Time   CHOL 159 08/12/2020 0855   TRIG 110 08/12/2020 0855   HDL 41 08/12/2020 0855   CHOLHDL 5.0 (H) 05/18/2020 0000  VLDL 34.6 10/23/2018 1018   LDLCALC 97 08/12/2020 0855   LDLCALC 146 (H) 05/18/2020 0000   LDLDIRECT 132 (H) 10/24/2019 0807     Hepatic Function Latest Ref Rng & Units 08/26/2020 08/12/2020 05/18/2020  Total Protein 6.1 - 8.1 g/dL 7.1 - 7.6  Albumin 3.5 - 5.0 - 4.2 -  AST 10 - 35 U/L 11 12(A) 12  ALT 9 - 46 U/L '10 11 12  ' Alk Phosphatase 25 - 125 - 89 -  Total Bilirubin 0.2 - 1.2 mg/dL 0.2 - 0.3  Bilirubin, Direct 0.0 - 0.2 mg/dL - - 0.1     CBC Latest Ref Rng & Units 09/18/2020 08/26/2020 08/07/2020  WBC 3.8 - 10.8 Thousand/uL 6.2 6.0 6.6  Hemoglobin 13.2 - 17.1 g/dL 9.3(L) 9.6(L) 11.4(L)  Hematocrit 38.5 - 50.0 % 28.3(L) 29.3(L) 34.3(L)  Platelets 140 - 400 Thousand/uL 448(H) 377 445(H)    No results found for: VD25OH  Clinical ASCVD: Yes  The ASCVD Risk score (Arnett DK, et al., 2019) failed to calculate for the following reasons:   The patient has a prior MI or stroke diagnosis    Other: (CHADS2VASc if Afib, PHQ9 if depression, MMRC or CAT for COPD, ACT, DEXA)  Social History   Tobacco Use  Smoking Status Former   Packs/day: 1.00   Years: 25.00   Pack years: 25.00   Types: Cigarettes   Quit date: 04/11/1984   Years since quitting: 36.7  Smokeless Tobacco Former   Quit date: 04/02/1985   BP Readings from Last 3 Encounters:  12/07/20 (!) 172/68  12/02/20 (!) 142/78  11/30/20 (!) 163/68   Pulse Readings from Last 3 Encounters:  12/07/20 68  12/02/20 69  11/30/20 (!) 52   Wt Readings from Last 3 Encounters:  12/07/20 224 lb (101.6 kg)  12/02/20 227 lb 3.2 oz (103.1 kg)  11/30/20 240 lb (108.9 kg)    Assessment: Review of patient past medical history, allergies, medications, health status, including review of consultants reports, laboratory and other test data, was performed as part of comprehensive evaluation and provision of chronic care management services.   SDOH:  (Social Determinants of Health) assessments and interventions performed:    CCM Care Plan  Allergies  Allergen Reactions   Shellfish Allergy Rash    Medications Reviewed Today     Reviewed by Leontine Locket, [place]  (Certified Medical Assistant) on 12/07/20 at [place] List Status: <None>   Medication Order Taking? Sig Documenting Provider Last Dose Status Informant  allopurinol (ZYLOPRIM) 100 MG tablet [id number] Yes TAKE 1/2 TABLET BY MOUTH DAILY  Patient taking differently: Take 50 mg by mouth daily.   Luetta Nutting, DO Taking Active   amiodarone (PACERONE) 200 MG tablet [id number] Yes Take 200 mg by mouth daily. [provider] Taking Active   amLODipine (NORVASC) 5 MG tablet [id number] Yes Take 1 tablet (5 mg total) by mouth daily. Luetta Nutting, DO Taking Active   amoxicillin (AMOXIL) 500 MG capsule [id number] No Take 500 mg by mouth 2 (two) times daily.  Patient not taking: Reported on 12/07/2020   [provider] Not Taking Active   Aspirin 81 MG CAPS [id number] Yes Take 81 mg by mouth daily. [provider] Taking Active            Med Note Juliane Poot Jan 14, 2020 10:39 AM)    clotrimazole-betamethasone Donalynn Furlong) cream [id number] Yes APPLY 1 APPLICATION TOPICALLY DAILY Luetta Nutting, DO Taking Active   colchicine  0.6 MG tablet [id number] Yes TAKE 1 TABLET BY MOUTH EVERY DAY  Patient taking differently: Take 0.6 mg by mouth daily.   Luetta Nutting, DO Taking Active   colesevelam [facility]) 625 MG tablet [id number] Yes Take 2 tablets (1,250 mg total) by mouth daily. Renato Shin, MD Taking Active   Continuous Blood Gluc Receiver (FREESTYLE LIBRE 2 READER) [place] [id number] Yes USE TO CHECK BLOOD GLUCOSE DAILY. E11.65 Luetta Nutting, DO Taking Active   Continuous Blood Gluc Sensor (FREESTYLE LIBRE 14 DAY SENSOR) [place] [id number] Yes 1 DEVICE EVERY 14 (FOURTEEN) DAYS. Luetta Nutting, DO Taking Active   cycloSPORINE (RESTASIS) 0.05 % ophthalmic emulsion [id number] Yes Place 1 drop into both eyes 2 (two) times daily.  [provider] Taking Active Child           Med Note Broadus John, REGEENA   Tue Mar 03, 2020  7:06 PM)    dicyclomine (BENTYL) 20 MG tablet  [id number] Yes Take 1 tablet (20 mg total) by mouth 2 (two) times daily. Lacretia Leigh, MD Taking Active   docusate sodium (COLACE) 100 MG capsule [id number] Yes Take 100 mg by mouth daily as needed for mild constipation. [provider] Taking Active   ferrous sulfate 325 (65 FE) MG tablet [id number] Yes Take 325 mg by mouth daily with breakfast. [provider] Taking Active   glimepiride (AMARYL) 1 MG tablet [id number] Yes Take 0.5 tablets (0.5 mg total) by mouth daily with breakfast. Renato Shin, MD Taking Active   levofloxacin (LEVAQUIN) 500 MG tablet [id number] No Take 1 tablet (500 mg total) by mouth daily.  Patient not taking: Reported on 12/07/2020   Tommy Medal, Lavell Islam, MD Not Taking Active   metoprolol succinate (TOPROL-XL) 50 MG 24 hr tablet [id number] Yes Take 50 mg by mouth daily. [provider] Taking Active   omeprazole (PRILOSEC) 40 MG capsule [id number] Yes Take 1 capsule (40 mg total) by mouth in the morning and at bedtime. Luetta Nutting, DO Taking Active   rosuvastatin (CRESTOR) 40 MG tablet [id number] Yes Take 40 mg by mouth daily. [provider] Taking Active   sitaGLIPtin (JANUVIA) 50 MG tablet [id number] Yes Take 1 tablet (50 mg total) by mouth daily. Orson Eva, MD Taking Active Child  spironolactone (ALDACTONE) 25 MG tablet [id number] Yes TAKE 1 TABLET BY MOUTH EVERY DAY FOR EDEMA  Patient taking differently: Take 25 mg by mouth daily.   Luetta Nutting, DO Taking Active             Patient Active Problem List   Diagnosis Date Noted   Abdominal bloating 12/07/2020   Atrial fibrillation ([place]) 10/16/2020   QT prolongation 10/16/2020   Pruritus of groin in male 08/09/2020   Prosthetic hip infection ([place]) 04/13/2020   Enterobacter sepsis ([place]) 04/13/2020   Thigh abscess 04/13/2020   CKD (chronic kidney disease) 04/13/2020   Bacteremia    SBO (small bowel obstruction) (HCC)    Abdominal distension    Effusion of hip joint, left     Hip pain 03/03/2020   Gout of right hand 02/23/2020   Hyperkalemia 01/14/2020   Hydroureteronephrosis 01/14/2020   Colon cancer screening 04/28/2019   Intermittent claudication ([place]) 01/22/2019   GERD (gastroesophageal reflux disease) 10/23/2018   Pseudophakia 08/11/2016   Uncontrolled type 2 diabetes mellitus with hyperglycemia, without long-term current use of insulin ([place]) 06/26/2016   Hyperlipidemia with target LDL less than 70 06/26/2016   Angina pectoris ([place]) 06/26/2016   Type 2 diabetes mellitus with hypoglycemia without  coma ([place]) 06/24/2016   Essential hypertension 06/24/2016   CAD (coronary artery disease) 06/24/2016   Asthma 06/24/2016   Malignant neoplasm of prostate ([place]) 12/28/2015   OA (osteoarthritis) of hip 04/09/2012    Immunization History  Administered Date(s) Administered   Fluad Quad(high Dose 65+) 01/22/2019, 01/21/2020   Influenza, High Dose Seasonal PF 02/03/2017, 04/02/2018   Moderna SARS-COV2 Booster Vaccination 03/18/2020   Moderna Sars-Covid-2 Vaccination 06/22/2019   Pneumococcal Conjugate-13 04/24/2019   Pneumococcal Polysaccharide-23 10/23/2018   Tdap 04/11/2015    Conditions to be addressed/monitored: Atrial Fibrillation, HTN, HLD, and DMII  There are no care plans that you recently modified to display for this patient.   Medication Assistance: None required.  Patient affirms current coverage meets needs.  Patient's preferred pharmacy is:  [id number] [address] [id number]: [phone]: [phone] [facility] [address]Phone: [phone]: [phone] [place]Phone: [phone]: [phone] Uses pill box? No - pulls from original bottles and working well Pt endorses 90%  compliance  Follow Up:  Patient agrees to Care Plan and Follow-up.  Plan: Telephone follow up appointment with care management team member scheduled for:  2 weeks  KDarius Bump

## 2020-12-25 ENCOUNTER — Other Ambulatory Visit: Payer: Self-pay

## 2020-12-25 ENCOUNTER — Other Ambulatory Visit (HOSPITAL_COMMUNITY): Payer: Self-pay

## 2020-12-25 ENCOUNTER — Ambulatory Visit: Payer: HMO | Admitting: Infectious Disease

## 2020-12-25 ENCOUNTER — Encounter: Payer: Self-pay | Admitting: Infectious Disease

## 2020-12-25 VITALS — BP 162/76 | HR 65 | Temp 98.1°F | Wt 226.4 lb

## 2020-12-25 DIAGNOSIS — T8459XD Infection and inflammatory reaction due to other internal joint prosthesis, subsequent encounter: Secondary | ICD-10-CM | POA: Diagnosis not present

## 2020-12-25 DIAGNOSIS — Z96649 Presence of unspecified artificial hip joint: Secondary | ICD-10-CM | POA: Diagnosis not present

## 2020-12-25 DIAGNOSIS — R9431 Abnormal electrocardiogram [ECG] [EKG]: Secondary | ICD-10-CM

## 2020-12-25 DIAGNOSIS — I4891 Unspecified atrial fibrillation: Secondary | ICD-10-CM | POA: Diagnosis not present

## 2020-12-25 DIAGNOSIS — N183 Chronic kidney disease, stage 3 unspecified: Secondary | ICD-10-CM

## 2020-12-25 DIAGNOSIS — E11649 Type 2 diabetes mellitus with hypoglycemia without coma: Secondary | ICD-10-CM | POA: Diagnosis not present

## 2020-12-25 DIAGNOSIS — A4159 Other Gram-negative sepsis: Secondary | ICD-10-CM

## 2020-12-25 DIAGNOSIS — I2510 Atherosclerotic heart disease of native coronary artery without angina pectoris: Secondary | ICD-10-CM | POA: Diagnosis not present

## 2020-12-25 MED ORDER — LEVOFLOXACIN 500 MG PO TABS
500.0000 mg | ORAL_TABLET | Freq: Every day | ORAL | 11 refills | Status: DC
  Filled 2020-12-25 – 2021-01-15 (×2): qty 30, 30d supply, fill #0
  Filled 2021-02-15: qty 30, 30d supply, fill #1

## 2020-12-25 NOTE — Progress Notes (Signed)
Subjective:  Chief complaint: Still some residual pain in his hip when he gets up in the morning also has been complaining of pruritus in his legs which he has had on both the [place] and levaquin   Patient ID: [name], male    DOB: Nov 11, 1945, 75 y.o.   MRN: [id number]  HPI  75 year old Black man with PMHx of CAD, DM2, HTN, CKD, OA, s/p left hip arthoplasty (2001), prostate cancer, PAF, left ureteral stone s/p stenting (01/2020) who was sent from ortho clinic on 11/23 by Dr Wynelle Link. He grew Enterobacter from blood, urine and hip aspirate. He is sp surgery with Irrigation and debridement, left hip, with bearing surface exchange. Enterobacter grew on cultures.  We had him scheduled to complete antibiotics on 10 January.  In the interim he developed a fluid collection in his thigh that was drained and seemed rather bloody but which also grew Enterobacter from culture.   He finished cefepime and we started bactrim DS BID he ran into problems with hyperkalemia and elevated creatinine also in the context of other potassium sparing and potassium raising medications   We obtained Nuzyra PA though due to his deductible not having yet been met he would have to pay roughly $2k upfront.   He ended up doing that but then turned out that he need to pay $640 a month even though he had met that amount.   I then took [name] off he [name] because clinically seem d to be doing well and his CRP had normalized though his sed rate had not normalized I did come down from 140 into the 90s.  I also felt like continue him on this tetracycline would just be to cost prohibitive.  Unfortunately since he came off antibiotics he experienced recurrence of infection at his hip with increasing pain and also pus which began coming through the skin.  He was seen by Dr.  [name]  who aspirated the area and sent for culture. Exact same Enterobacter grew yet again from culture in June of 2022.  After careful  consideration we decided to start levofloxacin despite risk of C. difficile colitis, Achilles tendinopathy confusion problems with blood sugar control and QT prolongation in the context of him being on amiodarone and having slight QT prolongation at baseline.  Since starting levofloxacin he to be having no adverse effects from this.  He returned to clinic and we obtained a twelve-lead EKG.  If EKG shows normal sinus rhythm with heart first-degree AV block but no significant ST or T wave changes and a QT and QTc of 429 and 451 which was largely unchanged from his last EKG obtained.  He returns to clinic today after I saw him in July.  He still has some hip pain in the morning when he gets up but otherwise does not have much pain throughout the day.  He does have some itching on his lower extremities that he said he had on Nuzyra as well as levofloxacin that he is managing with a topical cream.     On exam he does not seem to have a significant allergic reaction.  I also do not know why he would have this pruritus with both Nuzyra and levofloxacin but perhaps it is related some dry skin?  Past Medical History:  Diagnosis Date   Arthritis    Asthma    no inhaler   Atrial fibrillation ([place]) 10/16/2020   Coronary artery disease    cardiologist-  dr  spruill; last visit 3 mos ago per pt   Enterobacter sepsis ([place]) 04/13/2020   GERD (gastroesophageal reflux disease)    History of MI (myocardial infarction)    1985   Hydronephrosis, left    Hypertension    Myocardial infarction [facility])    Nephrolithiasis    left   Neuromuscular disorder ([facility])    TINGLING IN BOTH HANDS   Presence of tooth-root and mandibular implants    lower dental implants   Prostate cancer ([place])    Prosthetic hip infection ([place]) 04/13/2020   QT prolongation 10/16/2020   Shortness of breath    WITH EXERTION   Thigh abscess 04/13/2020   Type 2 diabetes mellitus ([place])     Past Surgical History:  Procedure Laterality Date    BUNIONECTOMY     CYSTOSCOPY W/ RETROGRADES Left 03/14/2014   Procedure: CYSTOSCOPY WITH LEFT RETROGRADE PYELOGRAM, Left Ureteroscopy, [place] ureteral Stent No string;  Surgeon: [name], MD;  Location: [place];  Service: Urology;  Laterality: Left;   CYSTOSCOPY W/ URETERAL STENT PLACEMENT Bilateral 01/15/2020   Procedure: CYSTOSCOPY WITH RETROGRADE PYELOGRAM/URETERAL STENT PLACEMENT;  Surgeon: Alexis Frock, MD;  Location: [facility];  Service: Urology;  Laterality: Bilateral;   CYSTOSCOPY/URETEROSCOPY/HOLMIUM LASER/STENT PLACEMENT Bilateral 03/17/2020   Procedure: CYSTOSCOPY/URETEROSCOPY/HOLMIUM LASER/STENT LEFT PLACEMENT;  Surgeon: Festus Aloe, MD;  Location: [facility];  Service: Urology;  Laterality: Bilateral;   HERNIA REPAIR     INCISION AND DRAINAGE Left 03/09/2020   Procedure: INCISION AND DRAINAGE LEFT HIP WITH LINER EXCHANGE;  Surgeon: Gaynelle Arabian, MD;  Location: [facility];  Service: Orthopedics;  Laterality: Left;   IR FLUORO GUIDE CV LINE RIGHT  03/10/2020   IR RADIOLOGIST EVAL & MGMT  04/01/2020   IR RADIOLOGIST EVAL & MGMT  04/15/2020   IR RADIOLOGIST EVAL & MGMT  04/30/2020   IR REMOVAL TUN CV CATH W/O FL  04/22/2020   IR US GUIDE BX ASP/DRAIN  03/17/2020   IR US GUIDE VASC ACCESS RIGHT  03/10/2020   PROSTATE BIOPSY     TOTAL HIP ARTHROPLASTY Left 03-20-2009   TOTAL HIP ARTHROPLASTY  04/09/2012   Procedure: TOTAL HIP ARTHROPLASTY;  Surgeon: [name], MD;  Location: [facility];  Service: Orthopedics;  Laterality: Right;   TOTAL KNEE ARTHROPLASTY Left 05-16-2008    Family History  Problem Relation Age of Onset   Cancer Mother        breast   Multiple sclerosis Daughter    Cancer Father        prostate   Diabetes Father    Cancer Maternal Uncle        bone      Social History   Socioeconomic History   Marital status: Widowed    Spouse name: Not on file   Number of children: Not on file   Years of education: Not on file   Highest education level:  Not on file  Occupational History   Not on file  Tobacco Use   Smoking status: Former    Packs/day: 1.00    Years: 25.00    Pack years: 25.00    Types: Cigarettes    Quit date: 04/11/1984    Years since quitting: 36.7   Smokeless tobacco: Former    Quit date: 04/02/1985  Vaping Use   Vaping Use: Never used  Substance and Sexual Activity   Alcohol use: Not Currently    Comment: RARE   Drug use: No   Sexual activity: Not Currently  Other Topics Concern   Not on file  Social History Narrative   Not on file   Social Determinants of Health   Financial Resource Strain: Not on file  Food Insecurity: Not on file  Transportation Needs: Not on file  Physical Activity: Not on file  Stress: Not on file  Social Connections: Not on file    Allergies  Allergen Reactions   Shellfish Allergy Rash     Current Outpatient Medications:    allopurinol (ZYLOPRIM) 100 MG tablet, TAKE 1/2 TABLET BY MOUTH DAILY (Patient taking differently: Take 50 mg by mouth daily.), Disp: 45 tablet, Rfl: 1   amiodarone (PACERONE) 200 MG tablet, Take 200 mg by mouth daily., Disp: , Rfl:    amLODipine (NORVASC) 10 MG tablet, Take 1 tablet (10 mg total) by mouth daily., Disp: 90 tablet, Rfl: 1   Aspirin 81 MG CAPS, Take 81 mg by mouth daily., Disp: , Rfl:    clotrimazole-betamethasone (LOTRISONE) cream, APPLY 1 APPLICATION TOPICALLY DAILY, Disp: 30 g, Rfl: 0   colchicine 0.6 MG tablet, TAKE 1 TABLET BY MOUTH EVERY DAY (Patient taking differently: Take 0.6 mg by mouth daily.), Disp: 90 tablet, Rfl: 1   colesevelam (WELCHOL) 625 MG tablet, Take 2 tablets (1,250 mg total) by mouth daily., Disp: 180 tablet, Rfl: 3   Continuous Blood Gluc Receiver (FREESTYLE LIBRE 2 READER) DEVI, USE TO CHECK BLOOD GLUCOSE DAILY. E11.65, Disp: 1 each, Rfl: 0   Continuous Blood Gluc Sensor (FREESTYLE LIBRE 14 DAY SENSOR) MISC, 1 DEVICE EVERY 14 (FOURTEEN) DAYS., Disp: 6 each, Rfl: 1   cycloSPORINE (RESTASIS) 0.05 % ophthalmic  emulsion, Place 1 drop into both eyes 2 (two) times daily. , Disp: , Rfl:    docusate sodium (COLACE) 100 MG capsule, Take 100 mg by mouth daily as needed for mild constipation. (Patient not taking: Reported on 12/24/2020), Disp: , Rfl:    ferrous sulfate 325 (65 FE) MG tablet, Take 325 mg by mouth daily with breakfast., Disp: , Rfl:    gabapentin (NEURONTIN) 300 MG capsule, Take 300 mg by mouth 2 (two) times daily as needed., Disp: , Rfl:    glimepiride (AMARYL) 1 MG tablet, Take 0.5 tablets (0.5 mg total) by mouth daily with breakfast., Disp: 30 tablet, Rfl: 3   levofloxacin (LEVAQUIN) 500 MG tablet, Take 1 tablet (500 mg total) by mouth daily., Disp: 30 tablet, Rfl: 11   metoprolol succinate (TOPROL-XL) 50 MG 24 hr tablet, Take 50 mg by mouth daily., Disp: , Rfl:    rosuvastatin (CRESTOR) 40 MG tablet, Take 40 mg by mouth daily., Disp: , Rfl:    sitaGLIPtin (JANUVIA) 50 MG tablet, Take 1 tablet (50 mg total) by mouth daily., Disp: 30 tablet, Rfl: 0   Review of Systems  Constitutional:  Negative for activity change, appetite change, chills, diaphoresis, fatigue, fever and unexpected weight change.  HENT:  Negative for congestion, rhinorrhea, sinus pressure, sneezing, sore throat and trouble swallowing.   Eyes:  Negative for photophobia and visual disturbance.  Respiratory:  Negative for cough, chest tightness, shortness of breath, wheezing and stridor.   Cardiovascular:  Negative for chest pain, palpitations and leg swelling.  Gastrointestinal:  Negative for abdominal distention, abdominal pain, anal bleeding, blood in stool, constipation, diarrhea, nausea and vomiting.  Genitourinary:  Negative for difficulty urinating, dysuria, flank pain and hematuria.  Musculoskeletal:  Positive for myalgias. Negative for arthralgias, back pain, gait problem and joint swelling.  Skin:  Negative for color change, pallor, rash and wound.  Neurological:  Negative for dizziness, tremors, weakness,  light-headedness and headaches.  Hematological:  Negative for adenopathy. Does not bruise/bleed easily.  Psychiatric/Behavioral:  Negative for agitation, behavioral problems, confusion, decreased concentration, dysphoric mood, hallucinations, self-injury, sleep disturbance and suicidal ideas. The patient is not nervous/anxious.       Objective:   Physical Exam Constitutional:      General: He is not in acute distress.    Appearance: Normal appearance. He is well-developed. He is not ill-appearing or diaphoretic.  HENT:     Head: Normocephalic and atraumatic.     Right Ear: Hearing and external ear normal.     Left Ear: Hearing and external ear normal.     Nose: No nasal deformity or rhinorrhea.  Eyes:     General: No scleral icterus.    Conjunctiva/sclera: Conjunctivae normal.     Right eye: Right conjunctiva is not injected.     Left eye: Left conjunctiva is not injected.     Pupils: Pupils are equal, round, and reactive to light.  Neck:     Vascular: No JVD.  Cardiovascular:     Rate and Rhythm: Normal rate and regular rhythm.     Heart sounds: S1 normal and S2 normal. No murmur heard. Pulmonary:     Effort: Pulmonary effort is normal. No respiratory distress.     Breath sounds: No wheezing.  Abdominal:     General: There is no distension.     Palpations: Abdomen is soft.  Musculoskeletal:        General: No tenderness. Normal range of motion.     Right shoulder: Normal.     Left shoulder: Normal.     Cervical back: Normal range of motion and neck supple.     Right hip: Normal.     Left hip: Normal.     Right knee: Normal.     Left knee: Normal.  Lymphadenopathy:     Head:     Right side of head: No submandibular, preauricular or posterior auricular adenopathy.     Left side of head: No submandibular, preauricular or posterior auricular adenopathy.     Cervical: No cervical adenopathy.     Right cervical: No superficial or deep cervical adenopathy.    Left cervical:  No superficial or deep cervical adenopathy.  Skin:    General: Skin is warm and dry.     Coloration: Skin is not pale.     Findings: No abrasion, bruising, ecchymosis, erythema, lesion or rash.     Nails: There is no clubbing.  Neurological:     General: No focal deficit present.     Mental Status: He is alert and oriented to person, place, and time.     Sensory: No sensory deficit.     Coordination: Coordination normal.     Gait: Gait normal.  Psychiatric:        Attention and Perception: He is attentive.        Mood and Affect: Mood normal.        Speech: Speech normal.        Behavior: Behavior normal. Behavior is cooperative.        Thought Content: Thought content normal.        Judgment: Judgment normal.    Surgical site 12/25/2020:      Skin lower extremities where he is having itching December 25, 2020         Assessment & Plan:  Recent joint infection status post total exchange of  prosthesis with Enterobacter isolated. I am continue his levofloxacin which we need to get through at least a year and may need to try to push for indefinite therapy for the next few years given his recurrence  I am checking a sed rate CRP basic metabolic panel and CBC with differential today.   Pruritus: Not clear why this will be related to his levofloxacin but it does not seem that severe and I wonder if it might be related to dry skin  QT prolongation his QT was slightly prolonged at baseline he is on amiodarone and levofloxacin but his QT was stable on these drugs when we checked them last.  Coronary disease with ischemic cardiomyopathy: Followed by Dr. [name] with cardiology he said he was post have labs for Dr. Rosanna Randy so we will fax labs we are drawing today.  Diabetes mellitus monitored by PCP no problems recently with blood glucose on his fluoroquinolone therapy

## 2020-12-26 LAB — BASIC METABOLIC PANEL WITH GFR
BUN/Creatinine Ratio: 17 (calc) (ref 6–22)
BUN: 26 mg/dL — ABNORMAL HIGH (ref 7–25)
CO2: 23 mmol/L (ref 20–32)
Calcium: 8.8 mg/dL (ref 8.6–10.3)
Chloride: 110 mmol/L (ref 98–110)
Creat: 1.56 mg/dL — ABNORMAL HIGH (ref 0.70–1.28)
Glucose, Bld: 138 mg/dL — ABNORMAL HIGH (ref 65–99)
Potassium: 4.8 mmol/L (ref 3.5–5.3)
Sodium: 139 mmol/L (ref 135–146)
eGFR: 46 mL/min/{1.73_m2} — ABNORMAL LOW (ref >=60)

## 2020-12-26 LAB — CBC WITH DIFFERENTIAL/PLATELET
Absolute Monocytes: 573 cells/uL (ref 200–950)
Basophils Absolute: 20 cells/uL (ref 0–200)
Basophils Relative: 0.4 %
Eosinophils Absolute: 191 cells/uL (ref 15–500)
Eosinophils Relative: 3.9 %
HCT: 32.3 % — ABNORMAL LOW (ref 38.5–50.0)
Hemoglobin: 10.7 g/dL — ABNORMAL LOW (ref 13.2–17.1)
Lymphs Abs: 1181 cells/uL (ref 850–3900)
MCH: 30.7 pg (ref 27.0–33.0)
MCHC: 33.1 g/dL (ref 32.0–36.0)
MCV: 92.8 fL (ref 80.0–100.0)
MPV: 10 fL (ref 7.5–12.5)
Monocytes Relative: 11.7 %
Neutro Abs: 2935 cells/uL (ref 1500–7800)
Neutrophils Relative %: 59.9 %
Platelets: 330 10*3/uL (ref 140–400)
RBC: 3.48 10*6/uL — ABNORMAL LOW (ref 4.20–5.80)
RDW: 14.5 % (ref 11.0–15.0)
Total Lymphocyte: 24.1 %
WBC: 4.9 10*3/uL (ref 3.8–10.8)

## 2020-12-26 LAB — C-REACTIVE PROTEIN: CRP: 1.7 mg/L (ref <8.0)

## 2020-12-26 LAB — SEDIMENTATION RATE: Sed Rate: 43 mm/h — ABNORMAL HIGH (ref 0–20)

## 2021-01-01 DIAGNOSIS — N1330 Unspecified hydronephrosis: Secondary | ICD-10-CM | POA: Diagnosis not present

## 2021-01-01 DIAGNOSIS — N132 Hydronephrosis with renal and ureteral calculous obstruction: Secondary | ICD-10-CM | POA: Diagnosis not present

## 2021-01-01 DIAGNOSIS — Z87448 Personal history of other diseases of urinary system: Secondary | ICD-10-CM | POA: Diagnosis not present

## 2021-01-01 DIAGNOSIS — I700 Atherosclerosis of aorta: Secondary | ICD-10-CM | POA: Diagnosis not present

## 2021-01-07 ENCOUNTER — Other Ambulatory Visit: Payer: Self-pay

## 2021-01-07 ENCOUNTER — Ambulatory Visit: Payer: HMO | Admitting: Pharmacist

## 2021-01-07 DIAGNOSIS — I4891 Unspecified atrial fibrillation: Secondary | ICD-10-CM

## 2021-01-07 DIAGNOSIS — I10 Essential (primary) hypertension: Secondary | ICD-10-CM

## 2021-01-07 DIAGNOSIS — E785 Hyperlipidemia, unspecified: Secondary | ICD-10-CM

## 2021-01-07 DIAGNOSIS — E1165 Type 2 diabetes mellitus with hyperglycemia: Secondary | ICD-10-CM

## 2021-01-07 NOTE — Patient Instructions (Signed)
Visit Information  PATIENT GOALS:  Goals Addressed             This Visit's Progress    Medication Management       Patient Goals/Self-Care Activities Over the next 30 days, patient will:  take medications as prescribed and check blood pressure daily, document, and provide at future appointments  Follow Up Plan: Telephone follow up appointment with care management team member scheduled for:  1 month        The patient verbalized understanding of instructions, educational materials, and care plan provided today and agreed to receive a mailed copy of patient instructions, educational materials, and care plan.   Telephone follow up appointment with care management team member scheduled for: 1 month

## 2021-01-07 NOTE — Progress Notes (Signed)
Chronic Care Management Pharmacy Note  01/07/2021 Name:  [name] MRN:  [id number] DOB:  10/24/1945  Summary: addressed DM, HTN, HLD, Afib. Patient implemented dose increase of amlodipine 33m to 185m Updated readings AFTER increasing amlodipine to 1081mstarting 9/10: 175/79, 152/74, 154/74, 167/90, 169/86, 158,87, 179/92, 176/88.   Recommendations/Changes made from today's visit:  Recommend to add losartan 40m20mily. Patient also requested RX for gabapentin 300mg36m (previous medication, states he ran out.)  Patient to carry CGM reader with him to obtain more BG readings throughout the day, as well as continue checking BP and documenting for discussion at next visit.   Also will continue investigating/considering risk/benefit ratio for anticoagulation for afib.  Plan: f/u with pharmacist in 2 weeks.  Subjective: Aaron BELLINGn 75 y.75 year old male who is a primary patient of MatthLuetta Nutting  The CCM team was consulted for assistance with disease management and care coordination needs.    Engaged with patient by telephone for initial visit in response to provider referral for pharmacy case management and/or care coordination services.   Consent to Services:  The patient was given information about Chronic Care Management services, agreed to services, and gave verbal consent prior to initiation of services.  Please see initial visit note for detailed documentation.   Patient Care Team: MatthLuetta Nuttingas PCP - General (Family Medicine) HarwaCharolette Forwardas Consulting Physician (Cardiology) KlineDarius Bump [facility] (Pharmacist)  Recent office visits:  09/04/20-Cody MatthZigmund Daniel(PCP) Follow up visit. Freestyle Libre ordered. Follow up in 3 months. 08/07/20-Cody MatthZigmund Daniel(PCP) Hypertension follow up. Start clotrimazole/betamethasone. Labs ordered. Follow up in 4 weeks. 06/15/20-Cody MatthZigmund Daniel(PCP) Diabetic follow up. Follow up in 2  months.   Recent consult visits:  10/22/20-Cornelius N. Van DTommy Medal(Infectious Disease) Follow up visit. Follow up in 2 months. 10/16/20-Cornelius N. Van DTommy Medal(Infectious Disease) Follow up visit. Start on levofloxacin at 500 mg. 10/05/20-Mark ThomaHazle Nordmannthalmology) Diabetic Eye exam. 09/18/20-Cornelius N. Van DTommy Medal(Infectious Disease) Follow up visit. Labs ordered. Follow up in 2 months. 09/04/20-Sean EllisLoanne Drilling(Endocrinology) Diabetic follow up. Decrease Glimepiride 1 mg daily. 08/26/20-Cornelius N. Van DTommy Medal(Infectious Disease) Follow up visit. Labs ordered. 07/03/20-Sean EllisLoanne Drilling(Endocrinology) Diabetic follow up. Reduce the glimepiride to 2 mg each morning. Follow up in 2 months. 06/26/20-Cornelius N. Van DTommy Medal(Infectious Disease) Follow up visit. Labs ordered. Follow up in 2 months. 06/26/20-[name], [name] (Podiatry) Seen for bunion.   Hospital visits:  Medication Reconciliation was completed by comparing discharge summary, patient's EMR and Pharmacy list, and upon discussion with patient.   Admitted to the hospital on 08/01/20 due to Chest pain. Discharge date was 08/01/20. [facility] -started None noted   Medication Changes at Hospital Discharge: -Changed None noted   Medications Discontinued at Hospital Discharge: -Stopped None noted   Medications that remain the same after Hospital Discharge:??  -All other medications will remain the same.    Objective:  Lab Results  Component Value Date   CREATININE 1.56 (H) 12/25/2020   CREATININE 1.22 (H) 10/16/2020   CREATININE 1.21 (H) 09/18/2020    Lab Results  Component Value Date   HGBA1C 5.9 (A) 12/02/2020   Last diabetic Eye exam:  Lab Results  Component Value Date/Time   HMDIABEYEEXA No Retinopathy 10/05/2020 12:00 AM       Component  Value Date/Time   CHOL 159 08/12/2020 0855   TRIG 110  08/12/2020 0855   HDL 41 08/12/2020 0855   CHOLHDL 5.0 (H) 05/18/2020 0000   VLDL 34.6 10/23/2018 1018   LDLCALC 97 08/12/2020 0855   LDLCALC 146 (H) 05/18/2020 0000   LDLDIRECT 132 (H) 10/24/2019 0807    Hepatic Function Latest Ref Rng & Units 08/26/2020 08/12/2020 05/18/2020  Total Protein 6.1 - 8.1 g/dL 7.1 - 7.6  Albumin 3.5 - 5.0 - 4.2 -  AST 10 - 35 U/L 11 12(A) 12  ALT 9 - 46 U/L '10 11 12  ' Alk Phosphatase 25 - 125 - 89 -  Total Bilirubin 0.2 - 1.2 mg/dL 0.2 - 0.3  Bilirubin, Direct 0.0 - 0.2 mg/dL - - 0.1     CBC Latest Ref Rng & Units 12/25/2020 09/18/2020 08/26/2020  WBC 3.8 - 10.8 Thousand/uL 4.9 6.2 6.0  Hemoglobin 13.2 - 17.1 g/dL 10.7(L) 9.3(L) 9.6(L)  Hematocrit 38.5 - 50.0 % 32.3(L) 28.3(L) 29.3(L)  Platelets 140 - 400 Thousand/uL 330 448(H) 377    No results found for: VD25OH  Clinical ASCVD: Yes  The ASCVD Risk score (Arnett DK, et al., 2019) failed to calculate for the following reasons:   The patient has a prior MI or stroke diagnosis     Social History   Tobacco Use  Smoking Status Former   Packs/day: 1.00   Years: 25.00   Pack years: 25.00   Types: Cigarettes   Quit date: 04/11/1984   Years since quitting: 36.7  Smokeless Tobacco Former   Quit date: 04/02/1985   BP Readings from Last 3 Encounters:  12/25/20 (!) 162/76  12/07/20 (!) 172/68  12/02/20 (!) 142/78   Pulse Readings from Last 3 Encounters:  12/25/20 65  12/07/20 68  12/02/20 69   Wt Readings from Last 3 Encounters:  12/25/20 226 lb 6.4 oz (102.7 kg)  12/07/20 224 lb (101.6 kg)  12/02/20 227 lb 3.2 oz (103.1 kg)    Assessment: Review of patient past medical history, allergies, medications, health status, including review of consultants reports, laboratory and other test data, was performed as part of comprehensive evaluation and provision of chronic care management services.   SDOH:  (Social Determinants of Health) assessments and interventions performed:    CCM Care  Plan  Allergies  Allergen Reactions   Shellfish Allergy Rash    Medications Reviewed Today     Reviewed by Leontine Locket, [place] (Certified Medical Assistant) on 12/07/20 at [place] List Status: <None>   Medication Order Taking? Sig Documenting Provider Last Dose Status Informant  allopurinol (ZYLOPRIM) 100 MG tablet [id number] Yes TAKE 1/2 TABLET BY MOUTH DAILY  Patient taking differently: Take 50 mg by mouth daily.   Luetta Nutting, DO Taking Active   amiodarone (PACERONE) 200 MG tablet [id number] Yes Take 200 mg by mouth daily. [provider] Taking Active   amLODipine (NORVASC) 5 MG tablet [id number] Yes Take 1 tablet (5 mg total) by mouth daily. Luetta Nutting, DO Taking Active   amoxicillin (AMOXIL) 500 MG capsule [id number] No Take 500 mg by mouth 2 (two) times daily.  Patient not taking: Reported on 12/07/2020   [provider] Not Taking Active   Aspirin 81 MG CAPS [id number] Yes Take 81 mg by mouth daily. [provider] Taking Active            Med Note Marcell Barlow   Tue Jan 14, 2020 10:39 AM)    clotrimazole-betamethasone Donalynn Furlong)  cream [id number] Yes APPLY 1 APPLICATION TOPICALLY DAILY Luetta Nutting, DO Taking Active   colchicine 0.6 MG tablet [id number] Yes TAKE 1 TABLET BY MOUTH EVERY DAY  Patient taking differently: Take 0.6 mg by mouth daily.   Luetta Nutting, DO Taking Active   colesevelam [facility]) 625 MG tablet [id number] Yes Take 2 tablets (1,250 mg total) by mouth daily. Renato Shin, MD Taking Active   Continuous Blood Gluc Receiver (FREESTYLE LIBRE 2 READER) [place] [id number] Yes USE TO CHECK BLOOD GLUCOSE DAILY. E11.65 Luetta Nutting, DO Taking Active   Continuous Blood Gluc Sensor (FREESTYLE LIBRE 14 DAY SENSOR) [place] [id number] Yes 1 DEVICE EVERY 14 (FOURTEEN) DAYS. Luetta Nutting, DO Taking Active   cycloSPORINE (RESTASIS) 0.05 % ophthalmic emulsion [id number] Yes Place 1 drop into both eyes 2 (two) times daily.  [provider] Taking Active Child           Med Note Broadus John, REGEENA   Tue Mar 03, 2020  7:06 PM)    dicyclomine (BENTYL) 20 MG tablet [id number] Yes Take 1 tablet (20 mg total) by mouth 2 (two) times daily. Lacretia Leigh, MD Taking Active   docusate sodium (COLACE) 100 MG capsule [id number] Yes Take 100 mg by mouth daily as needed for mild constipation. [provider] Taking Active   ferrous sulfate 325 (65 FE) MG tablet [id number] Yes Take 325 mg by mouth daily with breakfast. [provider] Taking Active   glimepiride (AMARYL) 1 MG tablet [id number] Yes Take 0.5 tablets (0.5 mg total) by mouth daily with breakfast. Renato Shin, MD Taking Active   levofloxacin (LEVAQUIN) 500 MG tablet [id number] No Take 1 tablet (500 mg total) by mouth daily.  Patient not taking: Reported on 12/07/2020   Tommy Medal, Lavell Islam, MD Not Taking Active   metoprolol succinate (TOPROL-XL) 50 MG 24 hr tablet [id number] Yes Take 50 mg by mouth daily. [provider] Taking Active   omeprazole (PRILOSEC) 40 MG capsule [id number] Yes Take 1 capsule (40 mg total) by mouth in the morning and at bedtime. Luetta Nutting, DO Taking Active   rosuvastatin (CRESTOR) 40 MG tablet [id number] Yes Take 40 mg by mouth daily. [provider] Taking Active   sitaGLIPtin (JANUVIA) 50 MG tablet [id number] Yes Take 1 tablet (50 mg total) by mouth daily. Orson Eva, MD Taking Active Child  spironolactone (ALDACTONE) 25 MG tablet [id number] Yes TAKE 1 TABLET BY MOUTH EVERY DAY FOR EDEMA  Patient taking differently: Take 25 mg by mouth daily.   Luetta Nutting, DO Taking Active             Patient Active Problem List   Diagnosis Date Noted   Abdominal bloating 12/07/2020   Atrial fibrillation ([place]) 10/16/2020   QT prolongation 10/16/2020   Pruritus of groin in male 08/09/2020   Prosthetic hip infection ([place]) 04/13/2020   Enterobacter sepsis ([place]) 04/13/2020   Thigh abscess 04/13/2020   CKD (chronic kidney  disease) 04/13/2020   Bacteremia    SBO (small bowel obstruction) (HCC)    Abdominal distension    Effusion of hip joint, left    Hip pain 03/03/2020   Gout of right hand 02/23/2020   Hyperkalemia 01/14/2020   Hydroureteronephrosis 01/14/2020   Colon cancer screening 04/28/2019   Intermittent claudication ([place]) 01/22/2019   GERD (gastroesophageal reflux disease) 10/23/2018   Pseudophakia 08/11/2016   Uncontrolled type 2 diabetes mellitus with hyperglycemia, without long-term current use of insulin ([place]) 06/26/2016   Hyperlipidemia with target LDL less than 70  06/26/2016   Angina pectoris ([place]) 06/26/2016   Type 2 diabetes mellitus with hypoglycemia without coma ([place]) 06/24/2016   Essential hypertension 06/24/2016   CAD (coronary artery disease) 06/24/2016   Asthma 06/24/2016   Malignant neoplasm of prostate ([place]) 12/28/2015   OA (osteoarthritis) of hip 04/09/2012    Immunization History  Administered Date(s) Administered   Fluad Quad(high Dose 65+) 01/22/2019, 01/21/2020   Influenza, High Dose Seasonal PF 02/03/2017, 04/02/2018   Moderna SARS-COV2 Booster Vaccination 03/18/2020   Moderna Sars-Covid-2 Vaccination 06/22/2019   Pneumococcal Conjugate-13 04/24/2019   Pneumococcal Polysaccharide-23 10/23/2018   Tdap 04/11/2015    Conditions to be addressed/monitored: Atrial Fibrillation, HTN, HLD, and DMII  There are no care plans that you recently modified to display for this patient.   Medication Assistance: None required.  Patient affirms current coverage meets needs.  Patient's preferred pharmacy [facility]:  [id number] [address]Phone: [phone]: [phone] [facility] [address]Phone: [phone]: [phone] [place] [address]Phone: [phone]: [phone] Uses pill box? No - pulls from original bottles and working well Pt endorses 90% compliance  Follow Up:  Patient agrees to Care Plan and Follow-up.  Plan: Telephone follow up appointment with care management team member scheduled for:  2 weeks  KDarius Bump

## 2021-01-08 DIAGNOSIS — I4891 Unspecified atrial fibrillation: Secondary | ICD-10-CM

## 2021-01-08 DIAGNOSIS — I10 Essential (primary) hypertension: Secondary | ICD-10-CM

## 2021-01-08 DIAGNOSIS — E1165 Type 2 diabetes mellitus with hyperglycemia: Secondary | ICD-10-CM

## 2021-01-08 DIAGNOSIS — E785 Hyperlipidemia, unspecified: Secondary | ICD-10-CM | POA: Diagnosis not present

## 2021-01-15 ENCOUNTER — Ambulatory Visit: Payer: HMO | Admitting: Podiatry

## 2021-01-15 ENCOUNTER — Encounter: Payer: Self-pay | Admitting: Podiatry

## 2021-01-15 ENCOUNTER — Other Ambulatory Visit (HOSPITAL_COMMUNITY): Payer: Self-pay

## 2021-01-15 ENCOUNTER — Other Ambulatory Visit: Payer: Self-pay

## 2021-01-15 DIAGNOSIS — B351 Tinea unguium: Secondary | ICD-10-CM | POA: Diagnosis not present

## 2021-01-15 DIAGNOSIS — M7751 Other enthesopathy of right foot: Secondary | ICD-10-CM | POA: Diagnosis not present

## 2021-01-15 DIAGNOSIS — M79674 Pain in right toe(s): Secondary | ICD-10-CM

## 2021-01-15 DIAGNOSIS — M79675 Pain in left toe(s): Secondary | ICD-10-CM

## 2021-01-15 NOTE — Progress Notes (Signed)
Subjective:   Patient ID: [name], male   DOB: 75 y.o.   MRN: [id number]   HPI Patient presents stating he is having a lot of pain in the second digit of his right foot with fluid buildup and is developing a lot of discomfort in his nailbeds that become thick and he cannot cough.  Points to all nails of both feet   ROS      Objective:  Physical Exam  Neurovascular status intact with thick yellow brittle toenails 1-5 both feet that are painful along with a inflammation and fluid buildup of the inner phalangeal joint digit to right with elevated digit     Assessment:  Inflammatory capsulitis digit to right dorsal along with thick yellow brittle nailbeds 1-5 both feet     Plan:  H&P reviewed both conditions and for the second digit did sterile prep injected the inner phalangeal joint 2 mg dexamethasone Kenalog 2 mg Xylocaine and then debrided nailbeds 1-5 both feet no iatrogenic bleeding reappoint for routine care

## 2021-01-22 ENCOUNTER — Telehealth: Payer: Self-pay | Admitting: Internal Medicine

## 2021-01-22 NOTE — Telephone Encounter (Signed)
Error/disregard

## 2021-02-01 ENCOUNTER — Ambulatory Visit (INDEPENDENT_AMBULATORY_CARE_PROVIDER_SITE_OTHER): Payer: HMO | Admitting: Pharmacist

## 2021-02-01 ENCOUNTER — Other Ambulatory Visit: Payer: Self-pay

## 2021-02-01 DIAGNOSIS — E1165 Type 2 diabetes mellitus with hyperglycemia: Secondary | ICD-10-CM

## 2021-02-01 DIAGNOSIS — I10 Essential (primary) hypertension: Secondary | ICD-10-CM

## 2021-02-01 DIAGNOSIS — E785 Hyperlipidemia, unspecified: Secondary | ICD-10-CM

## 2021-02-01 DIAGNOSIS — I4891 Unspecified atrial fibrillation: Secondary | ICD-10-CM

## 2021-02-01 NOTE — Progress Notes (Signed)
Chronic Care Management Pharmacy Note  02/01/2021 Name:  [name] MRN:  [id number] DOB:  02-17-1946  Summary: addressed DM, HTN, HLD, Afib. Patient still runs very hypertensive. Blood glucose numbers looking great.  BP readings as follows: 10/1 - 178/98 10/2 - 182/85 10/3 - 175/81 10/4 - 191/97 10/5 - 169/93, 169/78 10/6 - 156/83 10/7 - 176/88 10/8 - 177/90  Asked patient to identify LOWEST reading within past month: 10/18 - 144/76  Recommendations/Changes made from today's visit:  Recommended patient request earlier visit with PCP if possible due to uncontrolled BP. Previously recommended/consider losartan 50m daily for BP control and renal protection. Also, pt requested gabapentin refill, he states he will ask at next appt.  Also will continue investigating/considering risk/benefit ratio for anticoagulation for afib.  Plan: f/u with pharmacist in 3 months  Subjective: Aaron VICUNAis an 75y.o. year old male who is a primary patient of Aaron Nutting DO.  The CCM team was consulted for assistance with disease management and care coordination needs.    Engaged with patient by telephone for follow up visit in response to provider referral for pharmacy case management and/or care coordination services.   Consent to Services:  The patient was given information about Chronic Care Management services, agreed to services, and gave verbal consent prior to initiation of services.  Please see initial visit note for detailed documentation.   Patient Care Team: Aaron Nutting DO as PCP - General (Family Medicine) HCharolette Forward MD as Consulting Physician (Cardiology) KDarius Bump RGriffiss Ec LLCas Pharmacist (Pharmacist)  Recent office visits:  09/04/20-Aaron MZigmund Daniel DO (PCP) Follow up visit. Freestyle Libre ordered. Follow up in 3 months. 08/07/20-Aaron MZigmund Daniel DO (PCP) Hypertension follow up. Start clotrimazole/betamethasone. Labs ordered. Follow up in 4  weeks. 06/15/20-Aaron MZigmund Daniel DO (PCP) Diabetic follow up. Follow up in 2 months.   Recent consult visits:  10/22/20-Aaron N. VTommy Medal MD (Infectious Disease) Follow up visit. Follow up in 2 months. 10/16/20-Aaron N. VTommy Medal MD (Infectious Disease) Follow up visit. Start on levofloxacin at 500 mg. 10/05/20-[name](Ophthalmology) Diabetic Eye exam. 09/18/20-Aaron N. VTommy Medal MD (Infectious Disease) Follow up visit. Labs ordered. Follow up in 2 months. 09/04/20-Aaron ELoanne Drilling MD (Endocrinology) Diabetic follow up. Decrease Glimepiride 1 mg daily. 08/26/20-Aaron N. VTommy Medal MD (Infectious Disease) Follow up visit. Labs ordered. 07/03/20-Aaron ELoanne Drilling MD (Endocrinology) Diabetic follow up. Reduce the glimepiride to 2 mg each morning. Follow up in 2 months. 06/26/20-Aaron N. VTommy Medal MD (Infectious Disease) Follow up visit. Labs ordered. Follow up in 2 months. 06/26/20-[name], [name] (Podiatry) Seen for bunion.   Hospital visits:  Medication Reconciliation was completed by comparing discharge summary, patient's EMR and Pharmacy list, and upon discussion with patient.   Admitted to the hospital on 08/01/20 due to Chest pain. Discharge date was 08/01/20. [facility] None noted   Medication Changes at Hospital Discharge: -Changed None noted   Medications Discontinued at Hospital Discharge: -Stopped None noted   Medications that remain the same after Hospital Discharge:??  -All other medications will remain the same.    Objective:  Lab Results  Component Value Date   CREATININE 1.56 (H) 12/25/2020   CREATININE 1.22 (H) 10/16/2020   CREATININE 1.21 (H) 09/18/2020    Lab Results  Component Value Date   HGBA1C 5.9 (A) 12/02/2020   Last diabetic Eye exam:  Lab Results  Component Value Date/Time  HMDIABEYEEXA No Retinopathy 10/05/2020 12:00 AM        Component Value Date/Time   CHOL 159 08/12/2020 0855   TRIG 110 08/12/2020 0855   HDL 41 08/12/2020 0855   CHOLHDL 5.0 (H) 05/18/2020 0000   VLDL 34.6 10/23/2018 1018   LDLCALC 97 08/12/2020 0855   LDLCALC 146 (H) 05/18/2020 0000   LDLDIRECT 132 (H) 10/24/2019 0807    Hepatic Function Latest Ref Rng & Units 08/26/2020 08/12/2020 05/18/2020  Total Protein 6.1 - 8.1 g/dL 7.1 - 7.6  Albumin 3.5 - 5.0 - 4.2 -  AST 10 - 35 U/L 11 12(A) 12  ALT 9 - 46 U/L '10 11 12  ' Alk Phosphatase 25 - 125 - 89 -  Total Bilirubin 0.2 - 1.2 mg/dL 0.2 - 0.3  Bilirubin, Direct 0.0 - 0.2 mg/dL - - 0.1     CBC Latest Ref Rng & Units 12/25/2020 09/18/2020 08/26/2020  WBC 3.8 - 10.8 Thousand/uL 4.9 6.2 6.0  Hemoglobin 13.2 - 17.1 g/dL 10.7(L) 9.3(L) 9.6(L)  Hematocrit 38.5 - 50.0 % 32.3(L) 28.3(L) 29.3(L)  Platelets 140 - 400 Thousand/uL 330 448(H) 377    No results found for: VD25OH  Clinical ASCVD: Yes  The ASCVD Risk score (Arnett DK, et al., 2019) failed to calculate for the following reasons:   The patient has a prior MI or stroke diagnosis     Social History   Tobacco Use  Smoking Status Former   Packs/day: 1.00   Years: 25.00   Pack years: 25.00   Types: Cigarettes   Quit date: 04/11/1984   Years since quitting: 36.8  Smokeless Tobacco Former   Quit date: 04/02/1985   BP Readings from Last 3 Encounters:  12/25/20 (!) 162/76  12/07/20 (!) 172/68  12/02/20 (!) 142/78   Pulse Readings from Last 3 Encounters:  12/25/20 65  12/07/20 68  12/02/20 69   Wt Readings from Last 3 Encounters:  12/25/20 226 lb 6.4 oz (102.7 kg)  12/07/20 224 lb (101.6 kg)  12/02/20 227 lb 3.2 oz (103.1 kg)    Assessment: Review of patient past medical history, allergies, medications, health status, including review of consultants reports, laboratory and other test data, was performed as part of comprehensive evaluation and provision of chronic care management services.   SDOH:  (Social Determinants of  Health) assessments and interventions performed:    CCM Care Plan  Allergies  Allergen Reactions   Shellfish Allergy Rash    Medications Reviewed Today     Reviewed by Wallene Huh, [name] (Physician) on 01/15/21 at 0950  Med List Status: <None>   Medication Order Taking? Sig Documenting Provider Last Dose Status Informant  allopurinol (ZYLOPRIM) 100 MG tablet [id number] No TAKE 1/2 TABLET BY MOUTH DAILY  Patient taking differently: Take 50 mg by mouth daily.   Luetta Nutting, DO Taking Active   amiodarone (PACERONE) 200 MG tablet [id number] No Take 200 mg by mouth daily. [provider] Taking Active   amLODipine (NORVASC) 10 MG tablet [id number] No Take 1 tablet (10 mg total) by mouth daily. Luetta Nutting, DO Taking Active   Aspirin 81 MG CAPS [id number] No Take 81 mg by mouth daily. [provider] Taking Active            Med Note Juliane Poot Jan 14, 2020 10:39 AM)    clotrimazole-betamethasone Donalynn Furlong) cream [id number] No APPLY 1 APPLICATION TOPICALLY DAILY Luetta Nutting, DO Taking Active   colchicine 0.6 MG tablet [id number] No  TAKE 1 TABLET BY MOUTH EVERY DAY  Patient taking differently: Take 0.6 mg by mouth daily.   Luetta Nutting, DO Taking Active   colesevelam [facility]) 625 MG tablet [id number] No Take 2 tablets (1,250 mg total) by mouth daily. Renato Shin, MD Taking Active   Continuous Blood Gluc Receiver (FREESTYLE LIBRE 2 READER) DEVI [id number] No USE TO CHECK BLOOD GLUCOSE DAILY. E11.65 Luetta Nutting, DO Taking Active   Continuous Blood Gluc Sensor (FREESTYLE LIBRE 14 DAY SENSOR) [place] [id number] No 1 DEVICE EVERY 14 (FOURTEEN) DAYS. Luetta Nutting, DO Taking Active   cycloSPORINE (RESTASIS) 0.05 % ophthalmic emulsion [id number] No Place 1 drop into both eyes 2 (two) times daily.  [provider] Taking Active Child           Med Note Broadus John, REGEENA   Tue Mar 03, 2020  7:06 PM)    docusate sodium (COLACE) 100 MG capsule [id number]  No Take 100 mg by mouth daily as needed for mild constipation. [provider] Taking Active   ferrous sulfate 325 (65 FE) MG tablet [id number] No Take 325 mg by mouth daily with breakfast. [provider] Taking Active   gabapentin (NEURONTIN) 300 MG capsule [id number] No Take 300 mg by mouth 2 (two) times daily as needed. [provider] Taking Active   glimepiride (AMARYL) 1 MG tablet [id number] No Take 0.5 tablets (0.5 mg total) by mouth daily with breakfast. Renato Shin, MD Taking Active   levofloxacin (LEVAQUIN) 500 MG tablet [id number] No Take 1 tablet by mouth daily. Tommy [name], Lavell Islam, MD Taking Active   metoprolol succinate (TOPROL-XL) 50 MG 24 hr tablet [id number] No Take 50 mg by mouth daily. [provider] Taking Active   rosuvastatin (CRESTOR) 40 MG tablet [id number] No Take 40 mg by mouth daily. [provider] Taking Active   sitaGLIPtin (JANUVIA) 50 MG tablet [id number] No Take 1 tablet (50 mg total) by mouth daily. Orson Eva, MD Taking Active Child            Patient Active Problem List   Diagnosis Date Noted   Abdominal bloating 12/07/2020   Atrial fibrillation ([place]) 10/16/2020   QT prolongation 10/16/2020   Pruritus of groin in male 08/09/2020   Prosthetic hip infection ([place]) 04/13/2020   Enterobacter sepsis ([place]) 04/13/2020   Thigh abscess 04/13/2020   CKD (chronic kidney disease) 04/13/2020   Bacteremia    SBO (small bowel obstruction) (HCC)    Abdominal distension    Effusion of hip joint, left    Hip pain 03/03/2020   Gout of right hand 02/23/2020   Hyperkalemia 01/14/2020   Hydroureteronephrosis 01/14/2020   Colon cancer screening 04/28/2019   Intermittent claudication ([place]) 01/22/2019   GERD (gastroesophageal reflux disease) 10/23/2018   Pseudophakia 08/11/2016   Uncontrolled type 2 diabetes mellitus with hyperglycemia, without long-term current use of insulin ([place]) 06/26/2016   Hyperlipidemia with target LDL  less than 70 06/26/2016   Angina pectoris ([place]) 06/26/2016   Type 2 diabetes mellitus with hypoglycemia without coma ([place]) 06/24/2016   Essential hypertension 06/24/2016   CAD (coronary artery disease) 06/24/2016   Asthma 06/24/2016   Malignant neoplasm of prostate ([place]) 12/28/2015   OA (osteoarthritis) of hip 04/09/2012    Immunization History  Administered Date(s) Administered   Fluad Quad(high Dose 65+) 01/22/2019, 01/21/2020   Influenza, High Dose Seasonal PF 02/03/2017, 04/02/2018   Moderna SARS-COV2 Booster Vaccination 03/18/2020   Moderna Sars-Covid-2 Vaccination 06/22/2019   Pneumococcal Conjugate-13 04/24/2019   Pneumococcal Polysaccharide-23  10/23/2018   Tdap 04/11/2015    Conditions to be addressed/monitored: Atrial Fibrillation, HTN, HLD, and DMII  Care Plan : Medication Management  Updates made by [name], [place] since 02/01/2021 12:00 AM     Problem: HTN, HLD, DM, Afib      Long-Range Goal: Disease Progression Prevention   Start Date: 12/24/2020  This Visit's Progress: On track  Recent Progress: On track  Priority: High  Note:   Current Barriers:  None at present   Pharmacist Clinical Goal(s):  Over the next 30 days, patient will adhere to plan to optimize therapeutic regimen for hypertension as evidenced by report of adherence to recommended medication management changes through collaboration with PharmD and provider.   Interventions: 1:1 collaboration with Luetta Nutting, DO regarding development and update of comprehensive plan of care as evidenced by provider attestation and co-signature Inter-disciplinary care team collaboration (see longitudinal plan of care) Comprehensive medication review performed; medication list updated in electronic medical record  Diabetes:  Controlled; current treatment:glimepiride 23m daily, sitagliptin 516mdaily; a1c 5.9  Current glucose readings:  Date - Fasting, Postprandial 10/1 - 120, 131 10/2 - 114,  139 10/3 - 107, 127 10/4 - 126, 102 10/5 - 115, 111 10/6 - 117, 181 10/7 - 124, 152, 159 10/8 - 115, 120   Denies hypoglycemic/hyperglycemic symptoms  Current meal patterns: breakfast: oatmeal or egg/sausage; lunch: burger or ham sandwich w/chips; dinner: greens & slaw with fish or chicken, or pork & beansc; snacks: crackers or chips or cookies; drinks: mostly water, sprite, apple juice  Current exercise: yardwork, [facility]  Counseled on lifestyle modifications and opportunities to improve diet  Recommend continue current regimen,  Hypertension:  Uncontrolled; current treatment:amlodipine 1083maily , toprol XL 74m38mily, spironolactone 74mg21mly;   Current home readings: 10/1 - 178/98 10/2 - 182/85 10/3 - 175/81 10/4 - 191/97 10/5 - 169/93, 169/78 10/6 - 156/83 10/7 - 176/88 10/8 - 177/90  Asked patient to identify LOWEST reading within past month: 10/18 - 144/76  Denies hypotensive/hypertensive symptoms  Counseled on importance of BP control Recommended continue current regimen, add losartan 74mg 20my, check BP at home, will call patient in 1 month to assess medication dose change,  Hyperlipidemia:  Controlled; current treatment:rosuvastatin 40mg d76m; LDL 97  Recommended continue current regimen Atrial Fibrillation:  Controlled; current rate/rhythm control: amiodarone 200mg da13m anticoagulant treatment: not on, did not know why  Recommended continue current regimen, ongoing investigation on risk/benefit ratio for anticoagulation  Patient Goals/Self-Care Activities Over the next 30 days, patient will:  take medications as prescribed and check blood pressure daily, document, and provide at future appointments  Follow Up Plan: Telephone follow up appointment with care management team member scheduled for: 3 months      Medication Assistance: None required.  Patient affirms current coverage meets needs.  Patient's preferred pharmacy  is:  [id number] [id number] [phone] [phone] box? No - pulls from original bottles and working well Pt endorses 90% compliance  Follow Up:  Patient agrees to Care Plan and Follow-up.  Plan: Telephone follow up appointment with care management team member scheduled for:  2 weeks  Larinda Buttery, PharmD Clinical Pharmacist [facility] [phone]

## 2021-02-01 NOTE — Patient Instructions (Signed)
Visit Information  PATIENT GOALS:  Goals Addressed             This Visit's Progress    Medication Management       Patient Goals/Self-Care Activities Over the next 90 days, patient will:  take medications as prescribed and check blood pressure daily, document, and provide at future appointments  Follow Up Plan: Telephone follow up appointment with care management team member scheduled for:  3 months        The patient verbalized understanding of instructions, educational materials, and care plan provided today and agreed to receive a mailed copy of patient instructions, educational materials, and care plan.   Telephone follow up appointment with care management team member scheduled for: 3 months  [name]

## 2021-02-08 DIAGNOSIS — I10 Essential (primary) hypertension: Secondary | ICD-10-CM | POA: Diagnosis not present

## 2021-02-08 DIAGNOSIS — E785 Hyperlipidemia, unspecified: Secondary | ICD-10-CM | POA: Diagnosis not present

## 2021-02-08 DIAGNOSIS — E1165 Type 2 diabetes mellitus with hyperglycemia: Secondary | ICD-10-CM | POA: Diagnosis not present

## 2021-02-08 DIAGNOSIS — I4891 Unspecified atrial fibrillation: Secondary | ICD-10-CM

## 2021-02-15 ENCOUNTER — Other Ambulatory Visit (HOSPITAL_COMMUNITY): Payer: Self-pay

## 2021-03-08 ENCOUNTER — Ambulatory Visit (INDEPENDENT_AMBULATORY_CARE_PROVIDER_SITE_OTHER): Payer: HMO | Admitting: Endocrinology

## 2021-03-08 ENCOUNTER — Other Ambulatory Visit: Payer: Self-pay

## 2021-03-08 VITALS — BP 128/40 | HR 76 | Ht 74.0 in | Wt 233.6 lb

## 2021-03-08 DIAGNOSIS — E11649 Type 2 diabetes mellitus with hypoglycemia without coma: Secondary | ICD-10-CM

## 2021-03-08 LAB — POCT GLYCOSYLATED HEMOGLOBIN (HGB A1C): Hemoglobin A1C: 6.4 % — AB (ref 4.0–5.6)

## 2021-03-08 NOTE — Patient Instructions (Addendum)
check your blood sugar once a day.  vary the time of day when you check, between before the 3 meals, and at bedtime.  also check if you have symptoms of your blood sugar being too high or too low.  please keep a record of the readings and bring it to your next appointment here (or you can bring the meter itself).  You can write it on any piece of paper.  please call us sooner if your blood sugar goes below 70, or if most of your readings are over 200.  Please stop taking the glimepiride, and:  Please continue the same other medications.   Applying benzoin to your arm (then letting it dry) helps keep the sensor from falling off.   Please come back for a follow-up appointment in 3 months.

## 2021-03-08 NOTE — Progress Notes (Signed)
Subjective:    Patient ID: [name], male    DOB: [birth year], 75 y.o.   MRN: [id number]  HPI Pt returns for f/u of diabetes mellitus:  DM type: 2 Dx'ed: 6384 Complications: CAD and CRI Therapy: 3 oral meds DKA: never Severe hypoglycemia: never Pancreatitis: never Pancreatic imaging: normal on 2021 CT SDOH: he gets Januvia samples from Dr [name]--- as he cannot afford to buy it.   Other: he has never been on insulin; he has [place] continuous glucose monitor.   Interval history:  I reviewed continuous glucose monitor data, but data are limited. Pt says this is due to sensor falling off.  Glucose varies from 65-225.   He takes meds as rx'ed.  pt states he feels well in general.   Past Medical History:  Diagnosis Date   Arthritis    Asthma    no inhaler   Atrial fibrillation ([place]) 10/16/2020   Coronary artery disease    cardiologist-  dr spruill; last visit 3 mos ago per pt   Enterobacter sepsis ([place]) 04/13/2020   GERD (gastroesophageal reflux disease)    History of MI (myocardial infarction)    1985   Hydronephrosis, left    Hypertension    Myocardial infarction ([place])    Nephrolithiasis    left   Neuromuscular disorder (HCC)    TINGLING IN BOTH HANDS   Presence of tooth-root and mandibular implants    lower dental implants   Prostate cancer ([place])    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   QT prolongation 10/16/2020   Shortness of breath    WITH EXERTION   Thigh abscess 04/13/2020   Type 2 diabetes mellitus ([place])     Past Surgical History:  Procedure Laterality Date   BUNIONECTOMY     CYSTOSCOPY W/ RETROGRADES Left 03/14/2014   Procedure: CYSTOSCOPY WITH LEFT RETROGRADE PYELOGRAM, Left Ureteroscopy, [place] ureteral Stent No string;  Surgeon: [name], MD;  Location: [place];  Service: Urology;  Laterality: Left;   CYSTOSCOPY W/ URETERAL STENT PLACEMENT Bilateral 01/15/2020   Procedure: CYSTOSCOPY WITH RETROGRADE PYELOGRAM/URETERAL STENT PLACEMENT;   Surgeon: Alexis Frock, MD;  Location: [facility];  Service: Urology;  Laterality: Bilateral;   CYSTOSCOPY/URETEROSCOPY/HOLMIUM LASER/STENT PLACEMENT Bilateral 03/17/2020   Procedure: CYSTOSCOPY/URETEROSCOPY/HOLMIUM LASER/STENT LEFT PLACEMENT;  Surgeon: Festus Aloe, MD;  Location: [facility];  Service: Urology;  Laterality: Bilateral;   HERNIA REPAIR     INCISION AND DRAINAGE Left 03/09/2020   Procedure: INCISION AND DRAINAGE LEFT HIP WITH LINER EXCHANGE;  Surgeon: Gaynelle Arabian, MD;  Location: [facility];  Service: Orthopedics;  Laterality: Left;   IR FLUORO GUIDE CV LINE RIGHT  03/10/2020   IR RADIOLOGIST EVAL & MGMT  04/01/2020   IR RADIOLOGIST EVAL & MGMT  04/15/2020   IR RADIOLOGIST EVAL & MGMT  04/30/2020   IR REMOVAL TUN CV CATH W/O FL  04/22/2020   IR US GUIDE BX ASP/DRAIN  03/17/2020   IR US GUIDE VASC ACCESS RIGHT  03/10/2020   PROSTATE BIOPSY     TOTAL HIP ARTHROPLASTY Left 03-20-2009   TOTAL HIP ARTHROPLASTY  04/09/2012   Procedure: TOTAL HIP ARTHROPLASTY;  Surgeon: [name], MD;  Location: [facility];  Service: Orthopedics;  Laterality: Right;   TOTAL KNEE ARTHROPLASTY Left 05-16-2008    Social History   Socioeconomic History   Marital status: Widowed    Spouse name: Not on file   Number of children: Not on file   Years of education: Not on  file   Highest education level: Not on file  Occupational History   Not on file  Tobacco Use   Smoking status: Former    Packs/day: 1.00    Years: 25.00    Pack years: 25.00    Types: Cigarettes    Quit date: 04/11/1984    Years since quitting: 36.9   Smokeless tobacco: Former    Quit date: 04/02/1985  Vaping Use   Vaping Use: Never used  Substance and Sexual Activity   Alcohol use: Not Currently    Comment: RARE   Drug use: No   Sexual activity: Not Currently  Other Topics Concern   Not on file  Social History Narrative   Not on file   Social Determinants of Health   Financial Resource Strain: Not on file  Food  Insecurity: Not on file  Transportation Needs: Not on file  Physical Activity: Not on file  Stress: Not on file  Social Connections: Not on file  Intimate Partner Violence: Not on file    Current Outpatient Medications on File Prior to Visit  Medication Sig Dispense Refill   allopurinol (ZYLOPRIM) 100 MG tablet TAKE 1/2 TABLET BY MOUTH DAILY (Patient taking differently: Take 50 mg by mouth daily.) 45 tablet 1   amiodarone (PACERONE) 200 MG tablet Take 200 mg by mouth daily.     amLODipine (NORVASC) 10 MG tablet Take 1 tablet (10 mg total) by mouth daily. 90 tablet 1   Aspirin 81 MG CAPS Take 81 mg by mouth daily.     clotrimazole-betamethasone (LOTRISONE) cream APPLY 1 APPLICATION TOPICALLY DAILY 30 g 0   colchicine 0.6 MG tablet TAKE 1 TABLET BY MOUTH EVERY DAY (Patient taking differently: Take 0.6 mg by mouth daily.) 90 tablet 1   colesevelam (WELCHOL) 625 MG tablet Take 2 tablets (1,250 mg total) by mouth daily. 180 tablet 3   Continuous Blood Gluc Receiver (FREESTYLE LIBRE 2 READER) DEVI USE TO CHECK BLOOD GLUCOSE DAILY. E11.65 1 each 0   Continuous Blood Gluc Sensor (FREESTYLE LIBRE 14 DAY SENSOR) MISC 1 DEVICE EVERY 14 (FOURTEEN) DAYS. 6 each 1   cycloSPORINE (RESTASIS) 0.05 % ophthalmic emulsion Place 1 drop into both eyes 2 (two) times daily.     docusate sodium (COLACE) 100 MG capsule Take 100 mg by mouth daily as needed for mild constipation.     ferrous sulfate 325 (65 FE) MG tablet Take 325 mg by mouth daily with breakfast.     gabapentin (NEURONTIN) 300 MG capsule Take 300 mg by mouth 2 (two) times daily as needed.     metoprolol succinate (TOPROL-XL) 50 MG 24 hr tablet Take 50 mg by mouth daily.     omeprazole (PRILOSEC) 40 MG capsule Take 40 mg by mouth daily.     rosuvastatin (CRESTOR) 40 MG tablet Take 40 mg by mouth daily.     sitaGLIPtin (JANUVIA) 50 MG tablet Take 1 tablet (50 mg total) by mouth daily. 30 tablet 0   spironolactone (ALDACTONE) 25 MG tablet Take 25 mg by  mouth daily.     No current facility-administered medications on file prior to visit.    Allergies  Allergen Reactions   Shellfish Allergy Rash    Family History  Problem Relation Age of Onset   Cancer Mother        breast   Multiple sclerosis Daughter    Cancer Father        prostate   Diabetes Father    Cancer Maternal Uncle  bone    BP (!) 128/40   Pulse 76   Ht 6\' 2"  (1.88 m)   Wt 233 lb 9.6 oz (106 kg)   SpO2 97%   BMI 29.99 kg/m    Review of Systems     Objective:   Physical Exam    A1c=6.4%    Assessment & Plan:  Type 2 DM: overcontrolled, for this SU-containing regimen. Device malfunction.  Patient Instructions  check your blood sugar once a day.  vary the time of day when you check, between before the 3 meals, and at bedtime.  also check if you have symptoms of your blood sugar being too high or too low.  please keep a record of the readings and bring it to your next appointment here (or you can bring the meter itself).  You can write it on any piece of paper.  please call us sooner if your blood sugar goes below 70, or if most of your readings are over 200.  Please stop taking the glimepiride, and:  Please continue the same other medications.   Applying benzoin to your arm (then letting it dry) helps keep the sensor from falling off.   Please come back for a follow-up appointment in 3 months.

## 2021-03-15 ENCOUNTER — Other Ambulatory Visit (HOSPITAL_COMMUNITY): Payer: Self-pay

## 2021-03-15 ENCOUNTER — Other Ambulatory Visit: Payer: Self-pay | Admitting: Infectious Disease

## 2021-03-15 MED ORDER — LEVOFLOXACIN 500 MG PO TABS
500.0000 mg | ORAL_TABLET | Freq: Every day | ORAL | 3 refills | Status: DC
  Filled 2021-03-15: qty 30, 30d supply, fill #0
  Filled 2021-04-13: qty 30, 30d supply, fill #1
  Filled 2021-05-14: qty 30, 30d supply, fill #2
  Filled 2021-06-15: qty 30, 30d supply, fill #3

## 2021-03-15 NOTE — Telephone Encounter (Signed)
Please advise regarding levofloxacin refill. Your note mentions to continue indefinitely, but it was discontinued by Dr. Loanne Drilling. Patient also does not have follow up with you scheduled, when would you like to see him again?

## 2021-03-22 ENCOUNTER — Ambulatory Visit: Payer: HMO | Admitting: Infectious Disease

## 2021-03-22 ENCOUNTER — Other Ambulatory Visit: Payer: Self-pay

## 2021-03-22 VITALS — BP 144/73 | HR 68 | Temp 98.0°F

## 2021-03-22 DIAGNOSIS — R9431 Abnormal electrocardiogram [ECG] [EKG]: Secondary | ICD-10-CM | POA: Diagnosis not present

## 2021-03-22 DIAGNOSIS — E785 Hyperlipidemia, unspecified: Secondary | ICD-10-CM | POA: Diagnosis not present

## 2021-03-22 DIAGNOSIS — E1122 Type 2 diabetes mellitus with diabetic chronic kidney disease: Secondary | ICD-10-CM | POA: Diagnosis not present

## 2021-03-22 DIAGNOSIS — N183 Chronic kidney disease, stage 3 unspecified: Secondary | ICD-10-CM

## 2021-03-22 DIAGNOSIS — I4891 Unspecified atrial fibrillation: Secondary | ICD-10-CM

## 2021-03-22 DIAGNOSIS — E1165 Type 2 diabetes mellitus with hyperglycemia: Secondary | ICD-10-CM | POA: Diagnosis not present

## 2021-03-22 DIAGNOSIS — I10 Essential (primary) hypertension: Secondary | ICD-10-CM | POA: Diagnosis not present

## 2021-03-22 DIAGNOSIS — A4159 Other Gram-negative sepsis: Secondary | ICD-10-CM

## 2021-03-22 DIAGNOSIS — I480 Paroxysmal atrial fibrillation: Secondary | ICD-10-CM | POA: Diagnosis not present

## 2021-03-22 DIAGNOSIS — Z7185 Encounter for immunization safety counseling: Secondary | ICD-10-CM | POA: Diagnosis not present

## 2021-03-22 DIAGNOSIS — T8459XD Infection and inflammatory reaction due to other internal joint prosthesis, subsequent encounter: Secondary | ICD-10-CM | POA: Diagnosis not present

## 2021-03-22 DIAGNOSIS — Z96649 Presence of unspecified artificial hip joint: Secondary | ICD-10-CM | POA: Diagnosis not present

## 2021-03-22 DIAGNOSIS — I25118 Atherosclerotic heart disease of native coronary artery with other forms of angina pectoris: Secondary | ICD-10-CM | POA: Diagnosis not present

## 2021-03-22 NOTE — Progress Notes (Signed)
Subjective:  Chief complaint: Here for follow-up for his prosthetic hip infection  Patient ID: [name], male    DOB: October 14, 1945, 75 y.o.   MRN: [id number]  HPI  75 year old Black man with PMHx of CAD, DM2, HTN, CKD, OA, s/p left hip arthoplasty (2001), prostate cancer, PAF, left ureteral stone s/p stenting (01/2020) who was sent from ortho clinic on 11/23 by Dr Wynelle Link. He grew Enterobacter from blood, urine and hip aspirate. He is sp surgery with Irrigation and debridement, left hip, with bearing surface exchange. Enterobacter grew on cultures.  We had him scheduled to complete antibiotics on 10 January.  In the interim he developed a fluid collection in his thigh that was drained and seemed rather bloody but which also grew Enterobacter from culture.   He finished cefepime and we started bactrim DS BID he ran into problems with hyperkalemia and elevated creatinine also in the context of other potassium sparing and potassium raising medications   We obtained Nuzyra PA though due to his deductible not having yet been met he would have to pay roughly $2k upfront.   He ended up doing that but then turned out that he need to pay $640 a month even though he had met that amount.   I then took [name] off he [name] because clinically seem d to be doing well and his CRP had normalized though his sed rate had not normalized I did come down from 140 into the 90s.  I also felt like continue him on this tetracycline would just be to cost prohibitive.  Unfortunately since he came off antibiotics he experienced recurrence of infection at his hip with increasing pain and also pus which began coming through the skin.  He was seen by Dr.  [name]  who aspirated the area and sent for culture. Exact same Enterobacter grew yet again from culture in June of 2022.  After careful consideration we decided to start levofloxacin despite risk of C. difficile colitis, Achilles tendinopathy confusion problems  with blood sugar control and QT prolongation in the context of him being on amiodarone and having slight QT prolongation at baseline.  Since starting levofloxacin he to be having no adverse effects from this.  He returned to clinic and we obtained a twelve-lead EKG.  If EKG shows normal sinus rhythm with heart first-degree AV block but no significant ST or T wave changes and a QT and QTc of 429 and 451 which was largely unchanged from his last EKG obtained.  Last time I saw [name] is having some pruritus that he did to the levofloxacin.  He continues to have this but he is able to tolerate it.  He has seen Dr. Loanne Drilling for his diabetes mellitus.  I was told by triage staff that his levofloxacin had been discontinued possibly by Dr. Loanne Drilling but I do not see mention of this in his note.  He continues to take the levofloxacin as mentioned but still has the itching.  His pain is improved versus when we saw him last.  He has not had C. difficile colitis.     Past Medical History:  Diagnosis Date   Arthritis    Asthma    no inhaler   Atrial fibrillation ([place]) 10/16/2020   Coronary artery disease    cardiologist-  dr spruill; last visit 3 mos ago per pt   Enterobacter sepsis ([place]) 04/13/2020   GERD (gastroesophageal reflux disease)    History of MI (myocardial infarction)  1985   Hydronephrosis, left    Hypertension    Myocardial infarction [facility])    Nephrolithiasis    left   Neuromuscular disorder (HCC)    TINGLING IN BOTH HANDS   Presence of tooth-root and mandibular implants    lower dental implants   Prostate cancer ([place])    Prosthetic hip infection ([place]) 04/13/2020   QT prolongation 10/16/2020   Shortness of breath    WITH EXERTION   Thigh abscess 04/13/2020   Type 2 diabetes mellitus [facility])     Past Surgical History:  Procedure Laterality Date   BUNIONECTOMY     CYSTOSCOPY W/ RETROGRADES Left 03/14/2014   Procedure: CYSTOSCOPY WITH LEFT RETROGRADE PYELOGRAM, Left  Ureteroscopy, [place]weft ureteral Stent No string;  Surgeon: [name], MD;  Location: [place];  Service: Urology;  Laterality: Left;   CYSTOSCOPY W/ URETERAL STENT PLACEMENT Bilateral 01/15/2020   Procedure: CYSTOSCOPY WITH RETROGRADE PYELOGRAM/URETERAL STENT PLACEMENT;  Surgeon: Alexis Frock, MD;  [facility];  Service: Urology;  Laterality: Bilateral;   CYSTOSCOPY/URETEROSCOPY/HOLMIUM LASER/STENT PLACEMENT Bilateral 03/17/2020   Procedure: CYSTOSCOPY/URETEROSCOPY/HOLMIUM LASER/STENT LEFT PLACEMENT;  Surgeon: Festus Aloe, MD;  [facility];  Service: Urology;  Laterality: Bilateral;   HERNIA REPAIR     INCISION AND DRAINAGE Left 03/09/2020   Procedure: INCISION AND DRAINAGE LEFT HIP WITH LINER EXCHANGE;  Surgeon: Gaynelle Arabian, MD;  [facility];  Service: Orthopedics;  Laterality: Left;   IR FLUORO GUIDE CV LINE RIGHT  03/10/2020   IR RADIOLOGIST EVAL & MGMT  04/01/2020   IR RADIOLOGIST EVAL & MGMT  04/15/2020   IR RADIOLOGIST EVAL & MGMT  04/30/2020   IR REMOVAL TUN CV CATH W/O FL  04/22/2020   IR US GUIDE BX ASP/DRAIN  03/17/2020   IR US GUIDE VASC ACCESS RIGHT  03/10/2020   PROSTATE BIOPSY     TOTAL HIP ARTHROPLASTY Left 03-20-2009   TOTAL HIP ARTHROPLASTY  04/09/2012   Procedure: TOTAL HIP ARTHROPLASTY;  Surgeon: [name], MD;  [facility];  Service: Orthopedics;  Laterality: Right;   TOTAL KNEE ARTHROPLASTY Left 05-16-2008    Family History  Problem Relation Age of Onset   Cancer Mother        breast   Multiple sclerosis Daughter    Cancer Father        prostate   Diabetes Father    Cancer Maternal Uncle        bone      Social History   Socioeconomic History   Marital status: Widowed    Spouse name: Not on file   Number of children: Not on file   Years of education: Not on file   Highest education level: Not on file  Occupational History   Not on file  Tobacco Use   Smoking status: Former    Packs/day: 1.00    Years:  25.00    Pack years: 25.00    Types: Cigarettes    Quit date: 04/11/1984    Years since quitting: 36.9   Smokeless tobacco: Former    Quit date: 04/02/1985  Vaping Use   Vaping Use: Never used  Substance and Sexual Activity   Alcohol use: Not Currently    Comment: RARE   Drug use: No   Sexual activity: Not Currently  Other Topics Concern   Not on file  Social History Narrative   Not on file   Social Determinants of Health   Financial Resource Strain: Not on file  Food Insecurity: Not on file  Transportation Needs: Not on file  Physical Activity: Not on file  Stress: Not on file  Social Connections: Not on file    Allergies  Allergen Reactions   Shellfish Allergy Rash     Current Outpatient Medications:    allopurinol (ZYLOPRIM) 100 MG tablet, TAKE 1/2 TABLET BY MOUTH DAILY (Patient taking differently: Take 50 mg by mouth daily.), Disp: 45 tablet, Rfl: 1   amiodarone (PACERONE) 200 MG tablet, Take 200 mg by mouth daily., Disp: , Rfl:    amLODipine (NORVASC) 10 MG tablet, Take 1 tablet (10 mg total) by mouth daily., Disp: 90 tablet, Rfl: 1   Aspirin 81 MG CAPS, Take 81 mg by mouth daily., Disp: , Rfl:    clotrimazole-betamethasone (LOTRISONE) cream, APPLY 1 APPLICATION TOPICALLY DAILY, Disp: 30 g, Rfl: 0   colchicine 0.6 MG tablet, TAKE 1 TABLET BY MOUTH EVERY DAY (Patient taking differently: Take 0.6 mg by mouth daily.), Disp: 90 tablet, Rfl: 1   colesevelam (WELCHOL) 625 MG tablet, Take 2 tablets (1,250 mg total) by mouth daily., Disp: 180 tablet, Rfl: 3   Continuous Blood Gluc Receiver (FREESTYLE LIBRE 2 READER) DEVI, USE TO CHECK BLOOD GLUCOSE DAILY. E11.65, Disp: 1 each, Rfl: 0   Continuous Blood Gluc Sensor (FREESTYLE LIBRE 14 DAY SENSOR) MISC, 1 DEVICE EVERY 14 (FOURTEEN) DAYS., Disp: 6 each, Rfl: 1   cycloSPORINE (RESTASIS) 0.05 % ophthalmic emulsion, Place 1 drop into both eyes 2 (two) times daily., Disp: , Rfl:    docusate sodium (COLACE) 100 MG capsule, Take 100  mg by mouth daily as needed for mild constipation., Disp: , Rfl:    ferrous sulfate 325 (65 FE) MG tablet, Take 325 mg by mouth daily with breakfast., Disp: , Rfl:    gabapentin (NEURONTIN) 300 MG capsule, Take 300 mg by mouth 2 (two) times daily as needed., Disp: , Rfl:    levofloxacin (LEVAQUIN) 500 MG tablet, Take 1 tablet by mouth daily., Disp: 30 tablet, Rfl: 3   metoprolol succinate (TOPROL-XL) 50 MG 24 hr tablet, Take 50 mg by mouth daily., Disp: , Rfl:    omeprazole (PRILOSEC) 40 MG capsule, Take 40 mg by mouth daily., Disp: , Rfl:    rosuvastatin (CRESTOR) 40 MG tablet, Take 40 mg by mouth daily., Disp: , Rfl:    sitaGLIPtin (JANUVIA) 50 MG tablet, Take 1 tablet (50 mg total) by mouth daily., Disp: 30 tablet, Rfl: 0   spironolactone (ALDACTONE) 25 MG tablet, Take 25 mg by mouth daily., Disp: , Rfl:    Review of Systems  Constitutional:  Negative for activity change, appetite change, chills, diaphoresis, fatigue, fever and unexpected weight change.  HENT:  Negative for congestion, rhinorrhea, sinus pressure, sneezing, sore throat and trouble swallowing.   Eyes:  Negative for photophobia and visual disturbance.  Respiratory:  Negative for cough, chest tightness, shortness of breath, wheezing and stridor.   Cardiovascular:  Negative for chest pain, palpitations and leg swelling.  Gastrointestinal:  Negative for abdominal distention, abdominal pain, anal bleeding, blood in stool, constipation, diarrhea, nausea and vomiting.  Genitourinary:  Negative for difficulty urinating, dysuria, flank pain and hematuria.  Musculoskeletal:  Negative for arthralgias, back pain, gait problem, joint swelling and myalgias.  Skin:  Negative for color change, pallor, rash and wound.  Neurological:  Negative for dizziness, tremors, weakness and light-headedness.  Hematological:  Negative for adenopathy. Does not bruise/bleed easily.  Psychiatric/Behavioral:  Negative for agitation, behavioral problems,  confusion, decreased concentration, dysphoric mood  and sleep disturbance.       Objective:   Physical Exam Constitutional:      Appearance: He is well-developed.  HENT:     Head: Normocephalic and atraumatic.  Eyes:     Conjunctiva/sclera: Conjunctivae normal.  Cardiovascular:     Rate and Rhythm: Normal rate and regular rhythm.  Pulmonary:     Effort: Pulmonary effort is normal. No respiratory distress.     Breath sounds: No wheezing.  Abdominal:     General: There is no distension.     Palpations: Abdomen is soft.  Musculoskeletal:        General: No tenderness. Normal range of motion.     Cervical back: Normal range of motion and neck supple.  Skin:    General: Skin is warm and dry.     Coloration: Skin is not pale.     Findings: No erythema or rash.  Neurological:     General: No focal deficit present.     Mental Status: He is alert and oriented to person, place, and time.  Psychiatric:        Mood and Affect: Mood normal.        Behavior: Behavior normal.        Thought Content: Thought content normal.        Judgment: Judgment normal.    Left hip surgical site March 22, 2021:           Assessment & Plan:  Prosthetic joint infection nfection status post total exchange of prosthesis with Enterobacter isolated  He had recurrence when he came off of Nuzyra \ I am checking a sed rate CRP metabolic panel and CBC with differential.  I am continuing his levofloxacin.  We need to push for at least a year of therapy and I am still concerned that he would relapse after a year and that we might need to try for indefinite therapy though I hate to do that with a high risk antibiotic such as levofloxacin  Pruritus: He attributes this to the levofloxacin but tolerates it  QT prolongation QT was slightly prolonged at baseline but is stable on his 2 chronic QT prolonging drugs amiodarone and levofloxacin   coronary disease with ischemic cardiomyopathy: Following up  with Dr. [name] today   Atrial fibrillation on anticoagulation currently on amiodarone as well  Diabetes mellitus: Following with PCP and with Dr. Loanne Drilling.  Chronic kidney disease: I am checking a repeat BMP  Vaccine counseling he is up-to-date on his vaccines including COVID-19 and influenza.

## 2021-03-23 LAB — CBC WITH DIFFERENTIAL/PLATELET
Absolute Monocytes: 632 cells/uL (ref 200–950)
Basophils Absolute: 12 cells/uL (ref 0–200)
Basophils Relative: 0.2 %
Eosinophils Absolute: 180 cells/uL (ref 15–500)
Eosinophils Relative: 3.1 %
HCT: 30.6 % — ABNORMAL LOW (ref 38.5–50.0)
Hemoglobin: 10.4 g/dL — ABNORMAL LOW (ref 13.2–17.1)
Lymphs Abs: 1102 cells/uL (ref 850–3900)
MCH: 31.6 pg (ref 27.0–33.0)
MCHC: 34 g/dL (ref 32.0–36.0)
MCV: 93 fL (ref 80.0–100.0)
MPV: 9.8 fL (ref 7.5–12.5)
Monocytes Relative: 10.9 %
Neutro Abs: 3874 cells/uL (ref 1500–7800)
Neutrophils Relative %: 66.8 %
Platelets: 315 10*3/uL (ref 140–400)
RBC: 3.29 10*6/uL — ABNORMAL LOW (ref 4.20–5.80)
RDW: 13.6 % (ref 11.0–15.0)
Total Lymphocyte: 19 %
WBC: 5.8 10*3/uL (ref 3.8–10.8)

## 2021-03-23 LAB — BASIC METABOLIC PANEL WITH GFR
BUN/Creatinine Ratio: 20 (calc) (ref 6–22)
BUN: 32 mg/dL — ABNORMAL HIGH (ref 7–25)
CO2: 20 mmol/L (ref 20–32)
Calcium: 8.5 mg/dL — ABNORMAL LOW (ref 8.6–10.3)
Chloride: 110 mmol/L (ref 98–110)
Creat: 1.6 mg/dL — ABNORMAL HIGH (ref 0.70–1.28)
Glucose, Bld: 147 mg/dL — ABNORMAL HIGH (ref 65–99)
Potassium: 5.2 mmol/L (ref 3.5–5.3)
Sodium: 137 mmol/L (ref 135–146)
eGFR: 45 mL/min/{1.73_m2} — ABNORMAL LOW (ref >=60)

## 2021-03-23 LAB — SEDIMENTATION RATE: Sed Rate: 48 mm/h — ABNORMAL HIGH (ref 0–20)

## 2021-03-23 LAB — C-REACTIVE PROTEIN: CRP: 0.7 mg/L (ref <8.0)

## 2021-03-29 ENCOUNTER — Other Ambulatory Visit: Payer: Self-pay | Admitting: Family Medicine

## 2021-04-08 ENCOUNTER — Encounter: Payer: Self-pay | Admitting: Family Medicine

## 2021-04-08 ENCOUNTER — Other Ambulatory Visit: Payer: Self-pay

## 2021-04-08 ENCOUNTER — Ambulatory Visit (INDEPENDENT_AMBULATORY_CARE_PROVIDER_SITE_OTHER): Payer: Self-pay | Admitting: Family Medicine

## 2021-04-08 VITALS — BP 147/65 | HR 66 | Temp 98.0°F | Ht 74.0 in | Wt 227.0 lb

## 2021-04-08 DIAGNOSIS — E1165 Type 2 diabetes mellitus with hyperglycemia: Secondary | ICD-10-CM

## 2021-04-08 DIAGNOSIS — I10 Essential (primary) hypertension: Secondary | ICD-10-CM

## 2021-04-08 DIAGNOSIS — L299 Pruritus, unspecified: Secondary | ICD-10-CM

## 2021-04-08 MED ORDER — HYDROXYZINE PAMOATE 25 MG PO CAPS
25.0000 mg | ORAL_CAPSULE | Freq: Three times a day (TID) | ORAL | 0 refills | Status: DC | PRN
Start: 2021-04-08 — End: 2021-06-09

## 2021-04-08 MED ORDER — LOSARTAN POTASSIUM 25 MG PO TABS: 25.0000 mg | ORAL_TABLET | Freq: Every day | ORAL | 0 refills | Status: DC

## 2021-04-08 NOTE — Patient Instructions (Signed)
Have labs completed in 1 week after starting losartan.  See me again in about 3 months.

## 2021-04-08 NOTE — Assessment & Plan Note (Signed)
Adding Vistaril as needed.

## 2021-04-08 NOTE — Assessment & Plan Note (Signed)
Lab Results  Component Value Date   HGBA1C 6.4 (A) 03/08/2021  Managed by endocrinology.  Doing well at this time.  Provided sample of libre 3 CGM.

## 2021-04-08 NOTE — Progress Notes (Signed)
[name] - 75 y.o. male MRN [id number]  Date of birth: 06-28-1945  Subjective Chief Complaint  Patient presents with   Follow-up    HPI [name] is a 75 year old male here today for follow-up visit.  Reports overall he is doing well.  Blood pressure remains elevated.  He is taking current prescribed medications as directed.  He denies any symptoms related to hypertension including chest pain, shortness of breath, palpitations, headaches or vision changes.  He continues to see endocrinology for management of type 2 diabetes.  He is using freestyle libre CGM.  He is having some increased itching which he thinks may be related to antibiotic that he remains on for infection of hip.  Has tried topical steroids which has helped on lower extremities.  ROS:  A comprehensive ROS was completed and negative except as noted per HPI  Allergies  Allergen Reactions   Shellfish Allergy Rash    Past Medical History:  Diagnosis Date   Arthritis    Asthma    no inhaler   Atrial fibrillation ([place]) 10/16/2020   Coronary artery disease    cardiologist-  dr spruill; last visit 3 mos ago per pt   Enterobacter sepsis ([place]) 04/13/2020   GERD (gastroesophageal reflux disease)    History of MI (myocardial infarction)    1985   Hydronephrosis, left    Hypertension    Myocardial infarction ([place])    Nephrolithiasis    left   Neuromuscular disorder (HCC)    TINGLING IN BOTH HANDS   Presence of tooth-root and mandibular implants    lower dental implants   Prostate cancer ([place])    Prosthetic hip infection ([place]) 04/13/2020   QT prolongation 10/16/2020   Shortness of breath    WITH EXERTION   Thigh abscess 04/13/2020   Type 2 diabetes mellitus ([place])     Past Surgical History:  Procedure Laterality Date   BUNIONECTOMY     CYSTOSCOPY W/ RETROGRADES Left 03/14/2014   Procedure: CYSTOSCOPY WITH LEFT RETROGRADE PYELOGRAM, Left Ureteroscopy, [place] ureteral Stent No string;  Surgeon: [name],  MD;  Location: [place];  Service: Urology;  Laterality: Left;   CYSTOSCOPY W/ URETERAL STENT PLACEMENT Bilateral 01/15/2020   Procedure: CYSTOSCOPY WITH RETROGRADE PYELOGRAM/URETERAL STENT PLACEMENT;  Surgeon: Alexis Frock, MD;  [facility];  Service: Urology;  Laterality: Bilateral;   CYSTOSCOPY/URETEROSCOPY/HOLMIUM LASER/STENT PLACEMENT Bilateral 03/17/2020   Procedure: CYSTOSCOPY/URETEROSCOPY/HOLMIUM LASER/STENT LEFT PLACEMENT;  Surgeon: Festus Aloe, MD;  [facility];  Service: Urology;  Laterality: Bilateral;   HERNIA REPAIR     INCISION AND DRAINAGE Left 03/09/2020   Procedure: INCISION AND DRAINAGE LEFT HIP WITH LINER EXCHANGE;  Surgeon: Gaynelle Arabian, MD;  [facility];  Service: Orthopedics;  Laterality: Left;   IR FLUORO GUIDE CV LINE RIGHT  03/10/2020   IR RADIOLOGIST EVAL & MGMT  04/01/2020   IR RADIOLOGIST EVAL & MGMT  04/15/2020   IR RADIOLOGIST EVAL & MGMT  04/30/2020   IR REMOVAL TUN CV CATH W/O FL  04/22/2020   IR US GUIDE BX ASP/DRAIN  03/17/2020   IR US GUIDE VASC ACCESS RIGHT  03/10/2020   PROSTATE BIOPSY     TOTAL HIP ARTHROPLASTY Left 03-20-2009   TOTAL HIP ARTHROPLASTY  04/09/2012   Procedure: TOTAL HIP ARTHROPLASTY;  Surgeon: [name], MD;  [facility];  Service: Orthopedics;  Laterality: Right;   TOTAL KNEE ARTHROPLASTY Left 05-16-2008    Social History   Socioeconomic History  Marital status: Widowed    Spouse name: Not on file   Number of children: Not on file   Years of education: Not on file   Highest education level: Not on file  Occupational History   Not on file  Tobacco Use   Smoking status: Former    Packs/day: 1.00    Years: 25.00    Pack years: 25.00    Types: Cigarettes    Quit date: 04/11/1984    Years since quitting: 37.0   Smokeless tobacco: Former    Quit date: 04/02/1985  Vaping Use   Vaping Use: Never used  Substance and Sexual Activity   Alcohol use: Not Currently    Comment: RARE    Drug use: No   Sexual activity: Not Currently  Other Topics Concern   Not on file  Social History Narrative   Not on file   Social Determinants of Health   Financial Resource Strain: Not on file  Food Insecurity: Not on file  Transportation Needs: Not on file  Physical Activity: Not on file  Stress: Not on file  Social Connections: Not on file    Family History  Problem Relation Age of Onset   Cancer Mother        breast   Multiple sclerosis Daughter    Cancer Father        prostate   Diabetes Father    Cancer Maternal Uncle        bone    Health Maintenance  Topic Date Due   Zoster Vaccines- Shingrix (1 of 2) Never done   URINE MICROALBUMIN  10/23/2019   COVID-19 Vaccine (2 - Moderna risk series) 04/15/2020   HEMOGLOBIN A1C  09/05/2021   OPHTHALMOLOGY EXAM  10/05/2021   FOOT EXAM  12/02/2021   TETANUS/TDAP  04/10/2025   COLONOSCOPY (Pts 45-9yrs Insurance coverage will need to be confirmed)  11/15/2028   Pneumonia Vaccine 60+ Years old  Completed   INFLUENZA VACCINE  Completed   Hepatitis C Screening  Completed   HPV VACCINES  Aged Out     ----------------------------------------------------------------------------------------------------------------------------------------------------------------------------------------------------------------- Physical Exam BP (!) 147/65 (BP Location: Left Arm, Patient Position: Sitting, Cuff Size: Normal)    Pulse 66    Temp 98 F (36.7 C)    Ht 6\' 2"  (1.88 m)    Wt 227 lb (103 kg)    SpO2 99%    BMI 29.15 kg/m   Physical Exam Constitutional:      Appearance: Normal appearance.  HENT:     Head: Normocephalic and atraumatic.  Eyes:     General: No scleral icterus. Cardiovascular:     Rate and Rhythm: Normal rate and regular rhythm.  Pulmonary:     Effort: Pulmonary effort is normal.     Breath sounds: Normal breath sounds.  Musculoskeletal:     Cervical back: Neck supple.  Neurological:     General: No focal  deficit present.     Mental Status: He is alert.  Psychiatric:        Mood and Affect: Mood normal.        Behavior: Behavior normal.    ------------------------------------------------------------------------------------------------------------------------------------------------------------------------------------------------------------------- Assessment and Plan  Essential hypertension Blood pressure elevated today.  Adding losartan back on.  Orders placed to have renal function and potassium rechecked in 1 week.  Uncontrolled type 2 diabetes mellitus with hyperglycemia, without long-term current use of insulin ([place]) Lab Results  Component Value Date   HGBA1C 6.4 (A) 03/08/2021  Managed by endocrinology.  Doing  well at this time.  Provided sample of libre 3 CGM.  Itching Adding Vistaril as needed.   Meds ordered this encounter  Medications   losartan (COZAAR) 25 MG tablet    Sig: Take 1 tablet (25 mg total) by mouth daily.    Dispense:  90 tablet    Refill:  0    Return in about 3 months (around 07/07/2021) for HTN.    This visit occurred during the SARS-CoV-2 public health emergency.  Safety protocols were in place, including screening questions prior to the visit, additional usage of staff PPE, and extensive cleaning of exam room while observing appropriate contact time as indicated for disinfecting solutions.

## 2021-04-08 NOTE — Assessment & Plan Note (Signed)
Blood pressure elevated today.  Adding losartan back on.  Orders placed to have renal function and potassium rechecked in 1 week.

## 2021-04-13 ENCOUNTER — Other Ambulatory Visit (HOSPITAL_COMMUNITY): Payer: Self-pay

## 2021-04-14 ENCOUNTER — Other Ambulatory Visit (HOSPITAL_COMMUNITY): Payer: Self-pay

## 2021-04-16 DIAGNOSIS — I10 Essential (primary) hypertension: Secondary | ICD-10-CM | POA: Diagnosis not present

## 2021-04-17 LAB — BASIC METABOLIC PANEL
BUN/Creatinine Ratio: 23 (calc) — ABNORMAL HIGH (ref 6–22)
BUN: 36 mg/dL — ABNORMAL HIGH (ref 7–25)
CO2: 20 mmol/L (ref 20–32)
Calcium: 9 mg/dL (ref 8.6–10.3)
Chloride: 110 mmol/L (ref 98–110)
Creat: 1.57 mg/dL — ABNORMAL HIGH (ref 0.70–1.28)
Glucose, Bld: 100 mg/dL — ABNORMAL HIGH (ref 65–99)
Potassium: 5.7 mmol/L — ABNORMAL HIGH (ref 3.5–5.3)
Sodium: 138 mmol/L (ref 135–146)

## 2021-04-21 ENCOUNTER — Other Ambulatory Visit: Payer: Self-pay

## 2021-04-21 ENCOUNTER — Ambulatory Visit (INDEPENDENT_AMBULATORY_CARE_PROVIDER_SITE_OTHER): Payer: HMO | Admitting: Pharmacist

## 2021-04-21 ENCOUNTER — Other Ambulatory Visit: Payer: Self-pay | Admitting: Family Medicine

## 2021-04-21 VITALS — BP 158/75

## 2021-04-21 DIAGNOSIS — E875 Hyperkalemia: Secondary | ICD-10-CM | POA: Diagnosis not present

## 2021-04-21 DIAGNOSIS — I10 Essential (primary) hypertension: Secondary | ICD-10-CM

## 2021-04-21 DIAGNOSIS — E785 Hyperlipidemia, unspecified: Secondary | ICD-10-CM

## 2021-04-21 DIAGNOSIS — I4891 Unspecified atrial fibrillation: Secondary | ICD-10-CM

## 2021-04-21 DIAGNOSIS — E1165 Type 2 diabetes mellitus with hyperglycemia: Secondary | ICD-10-CM

## 2021-04-21 MED ORDER — FREESTYLE LIBRE 3 SENSOR MISC: 1.0000 | 3 refills | Status: DC

## 2021-04-21 NOTE — Progress Notes (Signed)
Chronic Care Management Pharmacy Note  04/22/2021 Name:  [name] MRN:  [id number] DOB:  [birth year]  Summary: addressed DM, HTN, HLD, Afib. This encounter is primarily to assist patient with CGM and syncing Libre 3 to his phone.  Of note, patient is hyperkalemic on recent labs.   Recommendations/Changes made from today's visit:  - Coordinated with PCP for lab recheck to confirm potassium - Completed Libre 3 connection - If remains K >5.5, spironolactone is contraindicated. Recommend stop spironolactone, losartan. Consider hydralazine 36m TID for interim BP control and encourage hydration.  Plan: f/u with pharmacist in 1  week  Subjective: Aaron WEDDINGTONis an 76y.o. year old male who is a primary patient of MLuetta Nutting DO.  The CCM team was consulted for assistance with disease management and care coordination needs.    Engaged with patient face to face for follow up visit in response to provider referral for pharmacy case management and/or care coordination services.   Consent to Services:  The patient was given information about Chronic Care Management services, agreed to services, and gave verbal consent prior to initiation of services.  Please see initial visit note for detailed documentation.   Patient Care Team: MLuetta Nutting DO as PCP - General (Family Medicine) HCharolette Forward MD as Consulting Physician (Cardiology) KDarius Bump RL[facility] LLCas Pharmacist (Pharmacist)  Recent office visits:  09/04/20-Cody MZigmund Daniel DO (PCP) Follow up visit. Freestyle Libre ordered. Follow up in 3 months. 08/07/20-Cody MZigmund Daniel DO (PCP) Hypertension follow up. Start clotrimazole/betamethasone. Labs ordered. Follow up in 4 weeks. 06/15/20-Cody MZigmund Daniel DO (PCP) Diabetic follow up. Follow up in 2 months.   Recent consult visits:  10/22/20-Cornelius N. VTommy Medal MD (Infectious Disease) Follow up visit. Follow up in 2 months. 10/16/20-Cornelius N. VTommy Medal MD (Infectious  Disease) Follow up visit. Start on levofloxacin at 500 mg. 10/05/20-Mark THazle Nordmann(Ophthalmology) Diabetic Eye exam. 09/18/20-Cornelius N. VTommy Medal MD (Infectious Disease) Follow up visit. Labs ordered. Follow up in 2 months. 09/04/20-Sean ELoanne Drilling MD (Endocrinology) Diabetic follow up. Decrease Glimepiride 1 mg daily. 08/26/20-Cornelius N. VTommy Medal MD (Infectious Disease) Follow up visit. Labs ordered. 07/03/20-Sean ELoanne Drilling MD (Endocrinology) Diabetic follow up. Reduce the glimepiride to 2 mg each morning. Follow up in 2 months. 06/26/20-Cornelius N. VTommy Medal MD (Infectious Disease) Follow up visit. Labs ordered. Follow up in 2 months. 06/26/20-[name], [name] (Podiatry) Seen for bunion.   Hospital visits:  Medication Reconciliation was completed by comparing discharge summary, patients EMR and Pharmacy list, and upon discussion with patient.   Admitted to the hospital on 08/01/20 due to Chest pain. Discharge date was 08/01/20. [facility] None noted   Medication Changes at Hospital Discharge: -Changed None noted   Medications Discontinued at Hospital Discharge: -Stopped None noted   Medications that remain the same after Hospital Discharge:??  -All other medications will remain the same.    Objective:  Lab Results  Component Value Date   CREATININE 1.73 (H) 04/21/2021   CREATININE 1.57 (H) 04/16/2021   CREATININE 1.60 (H) 03/22/2021    Lab Results  Component Value Date   HGBA1C 6.4 (A) 03/08/2021   Last diabetic Eye exam:  Lab Results  Component Value Date/Time   HMDIABEYEEXA No Retinopathy 10/05/2020 12:00 AM       Component Value Date/Time   CHOL 159 08/12/2020 0855   TRIG 110 08/12/2020 0855   HDL 41 08/12/2020 0855  CHOLHDL 5.0 (H) 05/18/2020 0000   VLDL 34.6 10/23/2018 1018   LDLCALC 97 08/12/2020 0855   LDLCALC 146 (H) 05/18/2020 0000    LDLDIRECT 132 (H) 10/24/2019 0807    Hepatic Function Latest Ref Rng & Units 08/26/2020 08/12/2020 05/18/2020  Total Protein 6.1 - 8.1 g/dL 7.1 - 7.6  Albumin 3.5 - 5.0 - 4.2 -  AST 10 - 35 U/L 11 12(A) 12  ALT 9 - 46 U/L '10 11 12  ' Alk Phosphatase 25 - 125 - 89 -  Total Bilirubin 0.2 - 1.2 mg/dL 0.2 - 0.3  Bilirubin, Direct 0.0 - 0.2 mg/dL - - 0.1     CBC Latest Ref Rng & Units 03/22/2021 12/25/2020 09/18/2020  WBC 3.8 - 10.8 Thousand/uL 5.8 4.9 6.2  Hemoglobin 13.2 - 17.1 g/dL 10.4(L) 10.7(L) 9.3(L)  Hematocrit 38.5 - 50.0 % 30.6(L) 32.3(L) 28.3(L)  Platelets 140 - 400 Thousand/uL 315 330 448(H)    No results found for: VD25OH  Clinical ASCVD: Yes  The ASCVD Risk score (Arnett DK, et al., 2019) failed to calculate for the following reasons:   The patient has a prior MI or stroke diagnosis     Social History   Tobacco Use  Smoking Status Former   Packs/day: 1.00   Years: 25.00   Pack years: 25.00   Types: Cigarettes   Quit date: 04/11/1984   Years since quitting: 37.0  Smokeless Tobacco Former   Quit date: 04/02/1985   BP Readings from Last 3 Encounters:  04/22/21 (!) 158/75  04/08/21 (!) 147/65  03/22/21 (!) 144/73   Pulse Readings from Last 3 Encounters:  04/08/21 66  03/22/21 68  03/08/21 76   Wt Readings from Last 3 Encounters:  04/08/21 227 lb (103 kg)  03/08/21 233 lb 9.6 oz (106 kg)  12/25/20 226 lb 6.4 oz (102.7 kg)    Assessment: Review of patient past medical history, allergies, medications, health status, including review of consultants reports, laboratory and other test data, was performed as part of comprehensive evaluation and provision of chronic care management services.   SDOH:  (Social Determinants of Health) assessments and interventions performed:    CCM Care Plan  Allergies  Allergen Reactions   Shellfish Allergy Rash    Medications Reviewed Today     Reviewed by [name], [facility] (Pharmacist) on 04/21/21 at 1346  Med List Status:  <None>   Medication Order Taking? Sig Documenting Provider Last Dose Status Informant  allopurinol (ZYLOPRIM) 100 MG tablet [id number] Yes TAKE 1/2 TABLET BY MOUTH DAILY  Patient taking differently: Take 50 mg by mouth daily.   Luetta Nutting, DO Taking Active   amiodarone (PACERONE) 200 MG tablet [id number] Yes Take 200 mg by mouth daily. [provider] Taking Active   amLODipine (NORVASC) 10 MG tablet [id number] Yes Take 1 tablet (10 mg total) by mouth daily. Luetta Nutting, DO Taking Active   Aspirin 81 MG CAPS [id number] Yes Take 81 mg by mouth daily. [provider] Taking Active            Med Note Juliane Poot Jan 14, 2020 10:39 AM)    clotrimazole-betamethasone Donalynn Furlong) cream [id number] Yes APPLY 1 APPLICATION TOPICALLY DAILY Luetta Nutting, DO Taking Active   colchicine 0.6 MG tablet [id number] No TAKE 1 TABLET BY MOUTH EVERY DAY  Patient not taking: Reported on 04/21/2021   Luetta Nutting, DO Not Taking Active   colesevelam [facility]) 625 MG tablet [id number] Yes Take 2 tablets (1,250  mg total) by mouth daily. Renato Shin, MD Taking Active   Continuous Blood Gluc Receiver (FREESTYLE LIBRE 2 READER) DEVI [id number] No USE TO CHECK BLOOD GLUCOSE DAILY. E11.65  Patient not taking: Reported on 04/21/2021   Luetta Nutting, DO Not Taking Active   Continuous Blood Gluc Sensor (FREESTYLE LIBRE 2 SENSOR) MISC [id number] No 1 DEVICE EVERY 14 (FOURTEEN) DAYS.  Patient not taking: Reported on 04/21/2021   Luetta Nutting, DO Not Taking Active   cycloSPORINE (RESTASIS) 0.05 % ophthalmic emulsion [id number] Yes Place 1 drop into both eyes 2 (two) times daily. [provider] Taking Active Child           Med Note Broadus John, REGEENA   Tue Mar 03, 2020  7:06 PM)    docusate sodium (COLACE) 100 MG capsule [id number] Yes Take 100 mg by mouth daily as needed for mild constipation. [provider] Taking Active   ferrous sulfate 325 (65 FE) MG tablet [id number] Yes  Take 325 mg by mouth daily with breakfast. [provider] Taking Active   gabapentin (NEURONTIN) 300 MG capsule [id number]  Take 300 mg by mouth 2 (two) times daily as needed. [provider]  Active   hydrOXYzine (VISTARIL) 25 MG capsule [id number] Yes Take 1 capsule (25 mg total) by mouth 3 (three) times daily as needed for itching. Luetta Nutting, DO Taking Active   levofloxacin (LEVAQUIN) 500 MG tablet [id number] Yes Take 1 tablet by mouth daily. Tommy Medal, Lavell Islam, MD Taking Active   losartan (COZAAR) 25 MG tablet [id number] Yes Take 1 tablet (25 mg total) by mouth daily. Luetta Nutting, DO Taking Active   metoprolol succinate (TOPROL-XL) 50 MG 24 hr tablet [id number] Yes Take 50 mg by mouth daily. [provider] Taking Active   omeprazole (PRILOSEC) 40 MG capsule [id number] No Take 40 mg by mouth daily.  Patient not taking: Reported on 04/21/2021   [provider] Not Taking Active   rosuvastatin (CRESTOR) 40 MG tablet [id number] Yes Take 40 mg by mouth daily. [provider] Taking Active   sitaGLIPtin (JANUVIA) 50 MG tablet [id number] Yes Take 1 tablet (50 mg total) by mouth daily. Orson Eva, MD Taking Active Child  spironolactone (ALDACTONE) 25 MG tablet [id number] Yes Take 25 mg by mouth daily. [provider] Taking Active             Patient Active Problem List   Diagnosis Date Noted   Itching 04/08/2021   Abdominal bloating 12/07/2020   Atrial fibrillation ([place]) 10/16/2020   QT prolongation 10/16/2020   Pruritus of groin in male 08/09/2020   Prosthetic hip infection ([place]) 04/13/2020   Enterobacter sepsis ([place]) 04/13/2020   Thigh abscess 04/13/2020   CKD (chronic kidney disease) 04/13/2020   Bacteremia    SBO (small bowel obstruction) (HCC)    Abdominal distension    Effusion of hip joint, left    Hip pain 03/03/2020   Gout of right hand 02/23/2020   Hyperkalemia 01/14/2020   Hydroureteronephrosis 01/14/2020   Colon  cancer screening 04/28/2019   Intermittent claudication ([place]) 01/22/2019   GERD (gastroesophageal reflux disease) 10/23/2018   Pseudophakia 08/11/2016   Uncontrolled type 2 diabetes mellitus with hyperglycemia, without long-term current use of insulin ([place]) 06/26/2016   Hyperlipidemia with target LDL less than 70 06/26/2016   Angina pectoris ([place]) 06/26/2016   Type 2 diabetes mellitus with hypoglycemia without coma ([place]) 06/24/2016   Essential hypertension 06/24/2016   CAD (coronary artery disease) 06/24/2016   Asthma 06/24/2016  Malignant neoplasm of prostate ([place]) 12/28/2015   OA (osteoarthritis) of hip 04/09/2012    Immunization History  Administered Date(s) Administered   Fluad Quad(high Dose 65+) 01/22/2019, 01/21/2020, 03/10/2021   Influenza, High Dose Seasonal PF 02/03/2017, 04/02/2018   Moderna SARS-COV2 Booster Vaccination 03/18/2020   Moderna Sars-Covid-2 Vaccination 06/22/2019, 03/18/2020   Pneumococcal Conjugate-13 04/24/2019   Pneumococcal Polysaccharide-23 10/23/2018   Tdap 04/11/2015    Conditions to be addressed/monitored: Atrial Fibrillation, HTN, HLD, and DMII  Care Plan : Medication Management  Updates made by [name], [place] since 04/22/2021 12:00 AM     Problem: HTN, HLD, DM, Afib      Long-Range Goal: Disease Progression Prevention   Start Date: 12/24/2020  Recent Progress: On track  Priority: High  Note:   Current Barriers:  None at present   Pharmacist Clinical Goal(s):  Over the next 30 days, patient will adhere to plan to optimize therapeutic regimen for hypertension as evidenced by report of adherence to recommended medication management changes through collaboration with PharmD and provider.   Interventions: 1:1 collaboration with Luetta Nutting, DO regarding development and update of comprehensive plan of care as evidenced by provider attestation and co-signature Inter-disciplinary care team collaboration (see longitudinal plan of  care) Comprehensive medication review performed; medication list updated in electronic medical record  Diabetes:  Controlled; current treatment:glimepiride 94m daily, sitagliptin 549mdaily; a1c 5.9  Current glucose readings:  Date - Fasting, Postprandial 10/1 - 120, 131 10/2 - 114, 139 10/3 - 107, 127 10/4 - 126, 102 10/5 - 115, 111 10/6 - 117, 181 10/7 - 124, 152, 159 10/8 - 115, 120   Denies hypoglycemic/hyperglycemic symptoms  Current meal patterns: breakfast: oatmeal or egg/sausage; lunch: burger or ham sandwich w/chips; dinner: greens & slaw with fish or chicken, or pork & beansc; snacks: crackers or chips or cookies; drinks: mostly water, sprite, apple juice  Current exercise: yardwork, [facility]  Counseled on lifestyle modifications and opportunities to improve diet  Recommend continue current regimen,  Hypertension:  Uncontrolled; current treatment:amlodipine 1074maily , toprol XL 20m27mily, spironolactone 20mg19mly, losartan 20mg 58my;   Current home readings: 10/1 - 178/98 10/2 - 182/85 10/3 - 175/81 10/4 - 191/97 10/5 - 169/93, 169/78 10/6 - 156/83 10/7 - 176/88 10/8 - 177/90  Asked patient to identify LOWEST reading within past month: 10/18 - 144/76  Denies hypotensive/hypertensive symptoms  Counseled on importance of BP control Recommended hold spironolactone, losartan. Consider hydralazine 20mg T24muring interim for hyperkalemia & AKI.   Hyperlipidemia:  Controlled; current treatment:rosuvastatin 40mg da59m LDL 97  Recommended continue current regimen Atrial Fibrillation:  Controlled; current rate/rhythm control: amiodarone 200mg dai61manticoagulant treatment: not on, did not know why  Recommended continue current regimen, ongoing investigation on risk/benefit ratio for anticoagulation  Patient Goals/Self-Care Activities Over the next 30 days, patient will:  take medications as prescribed and check blood pressure daily,  document, and provide at future appointments  Follow Up Plan: Telephone follow up appointment with care management team member scheduled for: 2 weeks       Medication Assistance: None required.  Patient affirms current coverage meets needs.  Patient's preferred pharmacy is:  [id number] [phone] [phone]  [address] [place] [address] Phone: [phone] Fax: [phone]   Uses pill box? No - pulls from original bottles and working well Pt endorses 90% compliance  Follow Up:  Patient agrees to Care Plan and Follow-up.  Plan: Telephone follow up appointment with care management team member scheduled for:  1 week  Larinda Buttery, PharmD Clinical Pharmacist [facility] [phone]

## 2021-04-22 ENCOUNTER — Other Ambulatory Visit: Payer: Self-pay | Admitting: Family Medicine

## 2021-04-22 ENCOUNTER — Telehealth: Payer: Self-pay

## 2021-04-22 LAB — BASIC METABOLIC PANEL
BUN/Creatinine Ratio: 17 (calc) (ref 6–22)
BUN: 29 mg/dL — ABNORMAL HIGH (ref 7–25)
CO2: 22 mmol/L (ref 20–32)
Calcium: 9.1 mg/dL (ref 8.6–10.3)
Chloride: 110 mmol/L (ref 98–110)
Creat: 1.73 mg/dL — ABNORMAL HIGH (ref 0.70–1.28)
Glucose, Bld: 126 mg/dL — ABNORMAL HIGH (ref 65–99)
Potassium: 5.7 mmol/L — ABNORMAL HIGH (ref 3.5–5.3)
Sodium: 138 mmol/L (ref 135–146)

## 2021-04-22 MED ORDER — HYDRALAZINE HCL 25 MG PO TABS: 25.0000 mg | ORAL_TABLET | Freq: Three times a day (TID) | ORAL | 1 refills | Status: DC

## 2021-04-22 MED ORDER — GABAPENTIN 300 MG PO CAPS: 300.0000 mg | ORAL_CAPSULE | Freq: Two times a day (BID) | ORAL | 0 refills | Status: DC

## 2021-04-22 MED ORDER — HYDRALAZINE HCL 10 MG PO TABS: 10.0000 mg | ORAL_TABLET | Freq: Three times a day (TID) | ORAL | 1 refills | Status: DC

## 2021-04-22 NOTE — Patient Instructions (Signed)
Visit Information  Thank you for taking time to visit with me today. Please don't hesitate to contact me if I can be of assistance to you before our next scheduled telephone appointment.  Following are the goals we discussed today:  Patient Goals/Self-Care Activities Over the next 30 days, patient will:  take medications as prescribed and check blood pressure daily, document, and provide at future appointments  Follow Up Plan: Telephone follow up appointment with care management team member scheduled for: 2 weeks  Please call the care guide team at [phone] if you need to cancel or reschedule your appointment.    The patient verbalized understanding of instructions, educational materials, and care plan provided today and agreed to receive a mailed copy of patient instructions, educational materials, and care plan.   [name]

## 2021-04-22 NOTE — Addendum Note (Signed)
Addended by: Peggye Ley on: 04/22/2021 10:09 AM   Modules accepted: Orders

## 2021-04-22 NOTE — Telephone Encounter (Signed)
LVM advising pt to stop spironolactone as well.   Per msg: After talking with [name] I think we should have [name] discontinue his spironolactone as well.  Adding hydralazine for BP to replace losartan.  This won't affect his potassium.  Follow up with me in 2-3 weeks  Requested pt callback to verigy receipt of message.

## 2021-04-29 ENCOUNTER — Ambulatory Visit: Payer: HMO | Admitting: Pharmacist

## 2021-04-29 ENCOUNTER — Other Ambulatory Visit: Payer: Self-pay

## 2021-04-29 DIAGNOSIS — E785 Hyperlipidemia, unspecified: Secondary | ICD-10-CM

## 2021-04-29 DIAGNOSIS — I10 Essential (primary) hypertension: Secondary | ICD-10-CM

## 2021-04-29 DIAGNOSIS — E1165 Type 2 diabetes mellitus with hyperglycemia: Secondary | ICD-10-CM

## 2021-04-29 DIAGNOSIS — I4891 Unspecified atrial fibrillation: Secondary | ICD-10-CM

## 2021-04-29 NOTE — Progress Notes (Signed)
Chronic Care Management Pharmacy Note  05/03/2021 Name:  [name] MRN:  [id number] DOB:  [birth year]  Summary: addressed DM, HTN, HLD, Afib. Patient has implemented changes of stopping losartan, spironolactone for hyperkalemia, and is taking hydralazine 95m TID for now to assist with blood pressure.  Glucose per CGM: Date of Download: 04/29/21 % Time CGM is active: 98% Average Glucose: 113 mg/dL Glucose Management Indicator: 6% Glucose Variability: 20.5 (goal <36%) Time in Goal:  - Time in range 70-180: 99% - Time above range: 1% - Time below range: 0% Observed patterns:  BP numbers as follows:  152/67 145/75, 170/85 146/72 149/78 160/78   Recommendations/Changes made from today's visit:  - Continue current medications & follow up with PCP for ongoing guidance with BP and hyperkalemia  - For future BP lowering, may consider hydralazine 1051mBID, or converting metoprolol XL 5042mo carvedilol 12.5mg35mD (for alpha and beta activity as opposed to beta-selective agent)  Plan: f/u with pharmacist in 3 months  Subjective: [name] 75 y21. year old male who is a primary patient of [name].  The CCM team was consulted for assistance with disease management and care coordination needs.    Engaged with patient face to face for follow up visit in response to provider referral for pharmacy case management and/or care coordination services.   Consent to Services:  The patient was given information about Chronic Care Management services, agreed to services, and gave verbal consent prior to initiation of services.  Please see initial visit note for detailed documentation.   Patient Care Team: [name] as PCP - General (Family Medicine) [name] as Consulting Physician (Cardiology) [name])  Recent office visits:  09/04/20-[name] (PCP) Follow up visit. Freestyle Libre ordered.  Follow up in 3 months. 08/07/20-[name] (PCP) Hypertension follow up. Start clotrimazole/betamethasone. Labs ordered. Follow up in 4 weeks. 06/15/20-[name] (PCP) Diabetic follow up. Follow up in 2 months.   Recent consult visits:  10/22/20-[name] (Infectious Disease) Follow up visit. Follow up in 2 months. 10/16/20-[name] (Infectious Disease) Follow up visit. Start on levofloxacin at 500 mg. 10/05/20-[facility]) Diabetic Eye exam. 09/18/20-[name] (Infectious Disease) Follow up visit. Labs ordered. Follow up in 2 months. 09/04/20-[name] (Endocrinology) Diabetic follow up. Decrease Glimepiride 1 mg daily. 08/26/20-[name] (Infectious Disease) Follow up visit. Labs ordered. 07/03/20-[name] (Endocrinology) Diabetic follow up. Reduce the glimepiride to 2 mg each morning. Follow up in 2 months. 06/26/20-[name] (Infectious Disease) Follow up visit. Labs ordered. Follow up in 2 months. 06/26/20-[name], [name] (Podiatry) Seen for bunion.   Hospital visits:  Medication Reconciliation was completed by comparing discharge summary, patients EMR and Pharmacy list, and upon discussion with patient.   Admitted to the hospital on 08/01/20 due to Chest pain. Discharge date was 08/01/20. [facility] -started None noted   Medication Changes at Hospital Discharge: -Changed None noted   Medications Discontinued at Hospital Discharge: -Stopped None noted   Medications that remain the same after Hospital Discharge:??  -All other medications will remain the same.    Objective:  Lab Results  Component Value Date   CREATININE 1.73 (H) 04/21/2021   CREATININE 1.57 (H) 04/16/2021   CREATININE 1.60 (  H) 03/22/2021    Lab Results  Component Value Date   HGBA1C  6.4 (A) 03/08/2021   Last diabetic Eye exam:  Lab Results  Component Value Date/Time   HMDIABEYEEXA No Retinopathy 10/05/2020 12:00 AM       Component Value Date/Time   CHOL 159 08/12/2020 0855   TRIG 110 08/12/2020 0855   HDL 41 08/12/2020 0855   CHOLHDL 5.0 (H) 05/18/2020 0000   VLDL 34.6 10/23/2018 1018   LDLCALC 97 08/12/2020 0855   LDLCALC 146 (H) 05/18/2020 0000   LDLDIRECT 132 (H) 10/24/2019 0807    Hepatic Function Latest Ref Rng & Units 08/26/2020 08/12/2020 05/18/2020  Total Protein 6.1 - 8.1 g/dL 7.1 - 7.6  Albumin 3.5 - 5.0 - 4.2 -  AST 10 - 35 U/L 11 12(A) 12  ALT 9 - 46 U/L '10 11 12  ' Alk Phosphatase 25 - 125 - 89 -  Total Bilirubin 0.2 - 1.2 mg/dL 0.2 - 0.3  Bilirubin, Direct 0.0 - 0.2 mg/dL - - 0.1     CBC Latest Ref Rng & Units 03/22/2021 12/25/2020 09/18/2020  WBC 3.8 - 10.8 Thousand/uL 5.8 4.9 6.2  Hemoglobin 13.2 - 17.1 g/dL 10.4(L) 10.7(L) 9.3(L)  Hematocrit 38.5 - 50.0 % 30.6(L) 32.3(L) 28.3(L)  Platelets 140 - 400 Thousand/uL 315 330 448(H)    No results found for: VD25OH  Clinical ASCVD: Yes  The ASCVD Risk score (Arnett DK, et al., 2019) failed to calculate for the following reasons:   The patient has a prior MI or stroke diagnosis     Social History   Tobacco Use  Smoking Status Former   Packs/day: 1.00   Years: 25.00   Pack years: 25.00   Types: Cigarettes   Quit date: 04/11/1984   Years since quitting: 37.0  Smokeless Tobacco Former   Quit date: 04/02/1985   BP Readings from Last 3 Encounters:  04/22/21 (!) 158/75  04/08/21 (!) 147/65  03/22/21 (!) 144/73   Pulse Readings from Last 3 Encounters:  04/08/21 66  03/22/21 68  03/08/21 76   Wt Readings from Last 3 Encounters:  04/08/21 227 lb (103 kg)  03/08/21 233 lb 9.6 oz (106 kg)  12/25/20 226 lb 6.4 oz (102.7 kg)    Assessment: Review of patient past medical history, allergies, medications, health status, including review of consultants reports, laboratory and other test  data, was performed as part of comprehensive evaluation and provision of chronic care management services.   SDOH:  (Social Determinants of Health) assessments and interventions performed:    CCM Care Plan  Allergies  Allergen Reactions   Shellfish Allergy Rash    Medications Reviewed Today     Reviewed by [name], [facility] (Pharmacist) on 05/03/21 at 2233  Med List Status: <None>   Medication Order Taking? Sig Documenting Provider Last Dose Status Informant  allopurinol (ZYLOPRIM) 100 MG tablet [id number]  TAKE 1/2 TABLET BY MOUTH DAILY  Patient taking differently: Take 50 mg by mouth daily.   Luetta Nutting, DO  Active   amiodarone (PACERONE) 200 MG tablet [id number]  Take 200 mg by mouth daily. [provider]  Active   amLODipine (NORVASC) 10 MG tablet [id number]  Take 1 tablet (10 mg total) by mouth daily. Luetta Nutting, DO  Active   Aspirin 81 MG CAPS [id number]  Take 81 mg by mouth daily. [provider]  Active            Med Note (SUTPHIN, CHASIE F  Tue Jan 14, 2020 10:39 AM)    clotrimazole-betamethasone Donalynn Furlong) cream [id number]  APPLY 1 APPLICATION TOPICALLY DAILY Luetta Nutting, DO  Active   colchicine 0.6 MG tablet [id number]  TAKE 1 TABLET BY MOUTH EVERY DAY  Patient not taking: Reported on 04/21/2021   Luetta Nutting, DO  Active   colesevelam [facility]) 625 MG tablet [id number]  Take 2 tablets (1,250 mg total) by mouth daily. Renato Shin, MD  Active   Continuous Blood Gluc Sensor (FREESTYLE LIBRE 3 SENSOR) [place] [id number] Yes 1 Device by Does not apply route every 14 (fourteen) days. Place 1 sensor on the skin every 14 days. Use to check glucose continuously Luetta Nutting, DO Taking Active   cycloSPORINE (RESTASIS) 0.05 % ophthalmic emulsion [id number] Yes Place 1 drop into both eyes 2 (two) times daily. [provider] Taking Active Child           Med Note Broadus John, REGEENA   Tue Mar 03, 2020  7:06 PM)    docusate sodium (COLACE) 100 MG  capsule [id number] Yes Take 100 mg by mouth daily as needed for mild constipation. [provider] Taking Active   ferrous sulfate 325 (65 FE) MG tablet [id number] Yes Take 325 mg by mouth daily with breakfast. [provider] Taking Active   gabapentin (NEURONTIN) 300 MG capsule [id number]  Take 1 capsule (300 mg total) by mouth 2 (two) times daily. Luetta Nutting, DO  Active   hydrALAZINE (APRESOLINE) 25 MG tablet [id number] Yes Take 1 tablet (25 mg total) by mouth 3 (three) times daily. Luetta Nutting, DO Taking Active   hydrOXYzine (VISTARIL) 25 MG capsule [id number] Yes Take 1 capsule (25 mg total) by mouth 3 (three) times daily as needed for itching. Luetta Nutting, DO Taking Active   levofloxacin (LEVAQUIN) 500 MG tablet [id number]  Take 1 tablet by mouth daily. Tommy [name], Lavell Islam, MD  Active   metoprolol succinate (TOPROL-XL) 50 MG 24 hr tablet [id number] Yes Take 50 mg by mouth daily. [provider] Taking Active   omeprazole (PRILOSEC) 40 MG capsule [id number]  Take 40 mg by mouth daily.  Patient not taking: Reported on 04/21/2021   [provider]  Active   rosuvastatin (CRESTOR) 40 MG tablet [id number]  Take 40 mg by mouth daily. [provider]  Active   sitaGLIPtin (JANUVIA) 50 MG tablet [id number]  Take 1 tablet (50 mg total) by mouth daily. Orson Eva, MD  Active Child            Patient Active Problem List   Diagnosis Date Noted   Itching 04/08/2021   Abdominal bloating 12/07/2020   Atrial fibrillation ([place]) 10/16/2020   QT prolongation 10/16/2020   Pruritus of groin in male 08/09/2020   Prosthetic hip infection ([place]) 04/13/2020   Enterobacter sepsis ([place]) 04/13/2020   Thigh abscess 04/13/2020   CKD (chronic kidney disease) 04/13/2020   Bacteremia    SBO (small bowel obstruction) (HCC)    Abdominal distension    Effusion of hip joint, left    Hip pain 03/03/2020   Gout of right hand 02/23/2020   Hyperkalemia 01/14/2020    Hydroureteronephrosis 01/14/2020   Colon cancer screening 04/28/2019   Intermittent claudication ([place]) 01/22/2019   GERD (gastroesophageal reflux disease) 10/23/2018   Pseudophakia 08/11/2016   Uncontrolled type 2 diabetes mellitus with hyperglycemia, without long-term current use of insulin ([place]) 06/26/2016   Hyperlipidemia with target LDL less than 70 06/26/2016   Angina pectoris ([place]) 06/26/2016   Type 2 diabetes  mellitus with hypoglycemia without coma ([place]) 06/24/2016   Essential hypertension 06/24/2016   CAD (coronary artery disease) 06/24/2016   Asthma 06/24/2016   Malignant neoplasm of prostate ([place]) 12/28/2015   OA (osteoarthritis) of hip 04/09/2012    Immunization History  Administered Date(s) Administered   Fluad Quad(high Dose 65+) 01/22/2019, 01/21/2020, 03/10/2021   Influenza, High Dose Seasonal PF 02/03/2017, 04/02/2018   Moderna SARS-COV2 Booster Vaccination 03/18/2020   Moderna Sars-Covid-2 Vaccination 06/22/2019, 03/18/2020   Pneumococcal Conjugate-13 04/24/2019   Pneumococcal Polysaccharide-23 10/23/2018   Tdap 04/11/2015    Conditions to be addressed/monitored: Atrial Fibrillation, HTN, HLD, and DMII  Care Plan : Medication Management  Updates made by [name], [place] since 05/03/2021 12:00 AM     Problem: HTN, HLD, DM, Afib      Long-Range Goal: Disease Progression Prevention   Start Date: 12/24/2020  Recent Progress: On track  Priority: High  Note:   Current Barriers:  None at present   Pharmacist Clinical Goal(s):  Over the next 90 days, patient will adhere to plan to optimize therapeutic regimen for hypertension as evidenced by report of adherence to recommended medication management changes through collaboration with PharmD and provider.   Interventions: 1:1 collaboration with Luetta Nutting, DO regarding development and update of comprehensive plan of care as evidenced by provider attestation and co-signature Inter-disciplinary care team  collaboration (see longitudinal plan of care) Comprehensive medication review performed; medication list updated in electronic medical record  Diabetes:  Controlled; current treatment:glimepiride 54m daily, sitagliptin 54mdaily; a1c 5.9  Current glucose readings:  Glucose per CGM: Date of Download: 04/29/21 % Time CGM is active: 98% Average Glucose: 113 mg/dL Glucose Management Indicator: 6% Glucose Variability: 20.5 (goal <36%) Time in Goal:  - Time in range 70-180: 99% - Time above range: 1% - Time below range: 0% Observed patterns:  Denies hypoglycemic/hyperglycemic symptoms  Current meal patterns: breakfast: oatmeal or egg/sausage; lunch: burger or ham sandwich w/chips; dinner: greens & slaw with fish or chicken, or pork & beansc; snacks: crackers or chips or cookies; drinks: mostly water, sprite, apple juice  Current exercise: yardwork, [facility]  Counseled on lifestyle modifications and opportunities to improve diet  Recommend continue current regimen,  Hypertension:  Uncontrolled; current treatment:amlodipine 1058maily , toprol XL 30m30mily, hydralazine 30mg79m;   Current home readings: BP numbers as follows:  152/67 145/75, 170/85 146/72 149/78 160/78  Denies hypotensive/hypertensive symptoms  Counseled on importance of BP control Recommended consider dose adjustment of hydralazine to 100mg 42m or converting metoprolol XL 50mg t4mrvedilol 12.5mg BID46mor alpha and beta activity as opposed to beta-selective agent)  Hyperlipidemia:  Controlled; current treatment:rosuvastatin 40mg dai55mLDL 97  Recommended continue current regimen Atrial Fibrillation:  Controlled; current rate/rhythm control: amiodarone 200mg dail84mnticoagulant treatment: not on, did not know why  Recommended continue current regimen, ongoing investigation on risk/benefit ratio for anticoagulation  Patient Goals/Self-Care Activities Over the next 90 days, patient  will:  take medications as prescribed and check blood pressure daily, document, and provide at future appointments  Follow Up Plan: Telephone follow up appointment with care management team member scheduled for: 3 months        Medication Assistance: None required.  Patient affirms current coverage meets needs.  Patient's preferred pharmacy is:  [id number] [id number] [phone]  [address] [place] [address] Phone: [phone] Fax: [phone]  [place] [address] Phone: [phone] Fax: [phone]   Uses pill box? No - pulls from original bottles and working well Pt endorses 90% compliance  Follow Up:  Patient agrees to Care Plan and Follow-up.  Plan: Telephone follow up appointment with care management team member scheduled for:  3 months  Larinda Buttery, PharmD Clinical Pharmacist [facility] [phone]

## 2021-05-03 NOTE — Patient Instructions (Signed)
Visit Information  Thank you for taking time to visit with me today. Please don't hesitate to contact me if I can be of assistance to you before our next scheduled telephone appointment.  Following are the goals we discussed today:  Patient Goals/Self-Care Activities Over the next 90 days, patient will:  take medications as prescribed and check blood pressure daily, document, and provide at future appointments  Follow Up Plan: Telephone follow up appointment with care management team member scheduled for:  3 months  Please call the care guide team at [phone] if you need to cancel or reschedule your appointment.   The patient verbalized understanding of instructions, educational materials, and care plan provided today and agreed to receive a mailed copy of patient instructions, educational materials, and care plan.   [name]

## 2021-05-06 ENCOUNTER — Other Ambulatory Visit: Payer: Self-pay

## 2021-05-06 ENCOUNTER — Ambulatory Visit: Payer: HMO | Admitting: Podiatry

## 2021-05-06 ENCOUNTER — Other Ambulatory Visit: Payer: Self-pay | Admitting: Family Medicine

## 2021-05-06 ENCOUNTER — Encounter: Payer: Self-pay | Admitting: Podiatry

## 2021-05-06 DIAGNOSIS — M7751 Other enthesopathy of right foot: Secondary | ICD-10-CM

## 2021-05-06 DIAGNOSIS — B351 Tinea unguium: Secondary | ICD-10-CM | POA: Diagnosis not present

## 2021-05-06 DIAGNOSIS — M79674 Pain in right toe(s): Secondary | ICD-10-CM | POA: Diagnosis not present

## 2021-05-06 DIAGNOSIS — M79675 Pain in left toe(s): Secondary | ICD-10-CM

## 2021-05-06 MED ORDER — TRIAMCINOLONE ACETONIDE 10 MG/ML IJ SUSP
10.0000 mg | Freq: Once | INTRAMUSCULAR | Status: AC
  Administered 2021-05-06: 10 mg

## 2021-05-08 ENCOUNTER — Encounter (HOSPITAL_COMMUNITY): Payer: Self-pay | Admitting: Emergency Medicine

## 2021-05-08 ENCOUNTER — Emergency Department (HOSPITAL_COMMUNITY): Payer: HMO

## 2021-05-08 ENCOUNTER — Other Ambulatory Visit: Payer: Self-pay

## 2021-05-08 ENCOUNTER — Emergency Department (HOSPITAL_COMMUNITY)
Admission: EM | Admit: 2021-05-08 | Discharge: 2021-05-08 | Disposition: A | Discharge status: Home / Self Care | Payer: HMO | Attending: Emergency Medicine | Admitting: Emergency Medicine

## 2021-05-08 DIAGNOSIS — R0789 Other chest pain: Secondary | ICD-10-CM | POA: Diagnosis not present

## 2021-05-08 DIAGNOSIS — Z79899 Other long term (current) drug therapy: Secondary | ICD-10-CM | POA: Insufficient documentation

## 2021-05-08 DIAGNOSIS — I2510 Atherosclerotic heart disease of native coronary artery without angina pectoris: Secondary | ICD-10-CM | POA: Insufficient documentation

## 2021-05-08 DIAGNOSIS — E119 Type 2 diabetes mellitus without complications: Secondary | ICD-10-CM | POA: Insufficient documentation

## 2021-05-08 DIAGNOSIS — Z7984 Long term (current) use of oral hypoglycemic drugs: Secondary | ICD-10-CM | POA: Diagnosis not present

## 2021-05-08 DIAGNOSIS — R42 Dizziness and giddiness: Secondary | ICD-10-CM | POA: Diagnosis not present

## 2021-05-08 DIAGNOSIS — I10 Essential (primary) hypertension: Secondary | ICD-10-CM | POA: Diagnosis not present

## 2021-05-08 DIAGNOSIS — Z7982 Long term (current) use of aspirin: Secondary | ICD-10-CM | POA: Diagnosis not present

## 2021-05-08 LAB — CBC
HCT: 28 % — ABNORMAL LOW (ref 39.0–52.0)
Hemoglobin: 9.3 g/dL — ABNORMAL LOW (ref 13.0–17.0)
MCH: 31.8 pg (ref 26.0–34.0)
MCHC: 33.2 g/dL (ref 30.0–36.0)
MCV: 95.9 fL (ref 80.0–100.0)
Platelets: 329 10*3/uL (ref 150–400)
RBC: 2.92 MIL/uL — ABNORMAL LOW (ref 4.22–5.81)
RDW: 14 % (ref 11.5–15.5)
WBC: 5.2 10*3/uL (ref 4.0–10.5)
nRBC: 0 % (ref 0.0–0.2)

## 2021-05-08 LAB — BASIC METABOLIC PANEL
Anion gap: 7 (ref 5–15)
BUN: 37 mg/dL — ABNORMAL HIGH (ref 8–23)
CO2: 18 mmol/L — ABNORMAL LOW (ref 22–32)
Calcium: 8.5 mg/dL — ABNORMAL LOW (ref 8.9–10.3)
Chloride: 113 mmol/L — ABNORMAL HIGH (ref 98–111)
Creatinine, Ser: 1.59 mg/dL — ABNORMAL HIGH (ref 0.61–1.24)
GFR, Estimated: 45 mL/min — ABNORMAL LOW (ref >60)
Glucose, Bld: 106 mg/dL — ABNORMAL HIGH (ref 70–99)
Potassium: 4.6 mmol/L (ref 3.5–5.1)
Sodium: 138 mmol/L (ref 135–145)

## 2021-05-08 LAB — TROPONIN I (HIGH SENSITIVITY)
Troponin I (High Sensitivity): <2 ng/L (ref <18)
Troponin I (High Sensitivity): <2 ng/L (ref <18)

## 2021-05-08 NOTE — ED Notes (Signed)
Pt given [place] sandwich and oj as ok'd by MD Horton.

## 2021-05-08 NOTE — Discharge Instructions (Signed)
You have been seen and discharged from the emergency department.  Your cardiac work-up was normal.  We consulted with Dr. [name], he states to call the office on Monday and he would like to see you next week.  Follow-up with your primary provider for further evaluation and further care. Take home medications as prescribed. If you have any worsening symptoms or further concerns for your health please return to an emergency department for further evaluation.

## 2021-05-08 NOTE — ED Notes (Signed)
Pt states understanding of dc instructions, importance of follow up.  Pt denies questions or concerns upon dc. Pt declined wheelchair assistance upon dc. Pt ambulated out of ed w/ steady gait. No belongings left in room upon dc.  

## 2021-05-08 NOTE — ED Triage Notes (Signed)
Patient arrives complaining of chest pressure and dizziness that began last night after chopping wood yesterday. Patient hx of diabetes, states sugars have been in the 170s.

## 2021-05-08 NOTE — ED Provider Notes (Signed)
[facility] DEPT Provider Note   CSN: [id number] Arrival date & time: 05/08/21  0342     History  Chief Complaint  Patient presents with   Chest Pain   Dizziness    [name] is a 76 y.o. male.  HPI  76 year old male with past medical history of HTN, HLD, DM, CAD presents the emergency department after an episode of chest heaviness.  Patient states yesterday when he was cutting wood he developed midsternal chest heaviness.  This is happened before, usually resolved with rest.  However his persisted overnight.  Radiated to his right shoulder, was improved with belching and passing gas.  Follows with Dr. [name] for cardiology, no recent stress or echo.  Currently states that the chest heaviness and discomfort is resolved.  No recent fever, cough, shortness of breath, swelling of his lower extremities.  Home Medications Prior to Admission medications   Medication Sig Start Date End Date Taking? Authorizing Provider  allopurinol (ZYLOPRIM) 100 MG tablet TAKE 1/2 TABLET BY MOUTH EVERY DAY 05/06/21   Luetta Nutting, DO  amiodarone (PACERONE) 200 MG tablet Take 200 mg by mouth daily. 05/18/20   [provider]  amLODipine (NORVASC) 10 MG tablet Take 1 tablet (10 mg total) by mouth daily. 12/07/20   Luetta Nutting, DO  Aspirin 81 MG CAPS Take 81 mg by mouth daily.    [provider]  clotrimazole-betamethasone (LOTRISONE) cream APPLY 1 APPLICATION TOPICALLY DAILY 11/05/20   Luetta Nutting, DO  colchicine 0.6 MG tablet TAKE 1 TABLET BY MOUTH EVERY DAY 07/16/20   Luetta Nutting, DO  colesevelam [facility]) 625 MG tablet Take 2 tablets (1,250 mg total) by mouth daily. 07/03/20   Renato Shin, MD  Continuous Blood Gluc Sensor (FREESTYLE LIBRE 3 SENSOR) MISC 1 Device by Does not apply route every 14 (fourteen) days. Place 1 sensor on the skin every 14 days. Use to check glucose continuously 04/21/21   Luetta Nutting, DO  cycloSPORINE (RESTASIS) 0.05 %  ophthalmic emulsion Place 1 drop into both eyes 2 (two) times daily. 09/23/19   [provider]  docusate sodium (COLACE) 100 MG capsule Take 100 mg by mouth daily as needed for mild constipation.    [provider]  ferrous sulfate 325 (65 FE) MG tablet Take 325 mg by mouth daily with breakfast.    [provider]  gabapentin (NEURONTIN) 300 MG capsule Take 1 capsule (300 mg total) by mouth 2 (two) times daily. 04/22/21 05/22/21  Luetta Nutting, DO  hydrALAZINE (APRESOLINE) 25 MG tablet Take 1 tablet (25 mg total) by mouth 3 (three) times daily. 04/22/21   Luetta Nutting, DO  hydrOXYzine (VISTARIL) 25 MG capsule Take 1 capsule (25 mg total) by mouth 3 (three) times daily as needed for itching. 04/08/21   Luetta Nutting, DO  levofloxacin (LEVAQUIN) 500 MG tablet Take 1 tablet by mouth daily. 03/15/21   Truman Hayward, MD  metoprolol succinate (TOPROL-XL) 50 MG 24 hr tablet Take 50 mg by mouth daily. 08/09/20   [provider]  omeprazole (PRILOSEC) 40 MG capsule Take 40 mg by mouth daily.    [provider]  rosuvastatin (CRESTOR) 40 MG tablet Take 40 mg by mouth daily.    [provider]  sitaGLIPtin (JANUVIA) 50 MG tablet Take 1 tablet (50 mg total) by mouth daily. 06/26/16   Orson Eva, MD      Allergies    Shellfish allergy    Review of Systems  Review of Systems  Constitutional:  Negative for fever.  Respiratory:  Positive for chest tightness. Negative for shortness of breath.   Cardiovascular:  Negative for chest pain, palpitations and leg swelling.  Gastrointestinal:  Negative for abdominal pain, diarrhea and vomiting.  Genitourinary:  Negative for flank pain.  Musculoskeletal:  Negative for back pain and neck pain.  Skin:  Negative for rash.  Neurological:  Negative for headaches.   Physical Exam Updated Vital Signs BP (!) 150/75    Pulse (!) 57    Temp 98 F (36.7 C)    Resp 17    Ht 6\' 1"  (1.854 m)    Wt 104.3 kg    SpO2 99%     BMI 30.34 kg/m  Physical Exam Vitals and nursing note reviewed.  Constitutional:      General: He is not in acute distress.    Appearance: Normal appearance. He is not diaphoretic.  HENT:     Head: Normocephalic.     Mouth/Throat:     Mouth: Mucous membranes are moist.  Cardiovascular:     Rate and Rhythm: Normal rate.  Pulmonary:     Effort: Pulmonary effort is normal. No respiratory distress.  Abdominal:     Palpations: Abdomen is soft.     Tenderness: There is no abdominal tenderness.  Skin:    General: Skin is warm.  Neurological:     Mental Status: He is alert and oriented to person, place, and time. Mental status is at baseline.  Psychiatric:        Mood and Affect: Mood normal.    ED Results / Procedures / Treatments   Labs (all labs ordered are listed, but only abnormal results are displayed) Labs Reviewed  BASIC METABOLIC PANEL - Abnormal; Notable for the following components:      Result Value   Chloride 113 (*)    CO2 18 (*)    Glucose, Bld 106 (*)    BUN 37 (*)    Creatinine, Ser 1.59 (*)    Calcium 8.5 (*)    GFR, Estimated 45 (*)    All other components within normal limits  CBC - Abnormal; Notable for the following components:   RBC 2.92 (*)    Hemoglobin 9.3 (*)    HCT 28.0 (*)    All other components within normal limits  TROPONIN I (HIGH SENSITIVITY)  TROPONIN I (HIGH SENSITIVITY)    EKG EKG Interpretation  Date/Time:  Saturday May 08 2021 04:00:00 EST Ventricular Rate:  62 PR Interval:  334 QRS Duration: 80 QT Interval:  425 QTC Calculation: 432 R Axis:   29 Text Interpretation: Sinus rhythm Prolonged PR interval Unchanged from previous Confirmed by Lavenia Atlas [phone]) on 05/08/2021 7:44:53 AM  Radiology DG Chest 2 View  Result Date: 05/08/2021 CLINICAL DATA:  Chest pressure EXAM: CHEST - 2 VIEW COMPARISON:  08/01/2020 FINDINGS: Normal heart size and mediastinal contours. No acute infiltrate or edema. No effusion or  pneumothorax. Spondylosis. No acute osseous findings. IMPRESSION: No active cardiopulmonary disease. Electronically Signed   By: Jorje Guild M.D.   On: 05/08/2021 04:15    Procedures Procedures    Medications Ordered in ED Medications - No data to display  ED Course/ Medical Decision Making/ A&P                           Medical Decision Making Amount and/or Complexity of Data Reviewed Labs: ordered. Radiology: ordered.  This patient presents to the ED for concern of chest tightness, this involves an extensive number of treatment options, and is a complaint that carries with it a high risk of complications and morbidity.  The differential diagnosis includes ACS, respiratory illness, MSK discomfort   Additional history obtained: -External records from outside source obtained and reviewed including: Chart review including previous notes, labs, imaging, consultation notes   Lab Tests: -I ordered, reviewed, and interpreted labs.  The pertinent results include: Baseline anemia, baseline kidney dysfunction, troponin that is less than 2 twice in a row   EKG -Normal sinus rhythm with first-degree AV block which is baseline   Imaging Studies ordered: -I ordered imaging studies including chest x-ray -I independently visualized and interpreted imaging which showed no acute finding -I agree with the radiologist interpretation   Consultations Obtained: I requested consultation with the cardiologist Dr. [name],  and discussed lab and imaging findings as well as pertinent plan - they recommend: Having the patient call their office on Monday for close outpatient cardiology follow-up and outpatient testing   ED Course: 76 year old male presents emergency department after an episode of chest heaviness, resolved prior to arrival but persistent overnight.  No back pain, shortness of breath, lower extremity swelling.  Less likely PE, dissection, CHF.  Concern to rule out ACS.  EKG is  unchanged for the patient, troponins are less than 2 with no delta.  Blood work is otherwise baseline.  Last stress test was 2018 and last echo was a couple years ago.  Spoke with his cardiologist Dr. [name] who is pleased with his current work-up, low suspicion for ACS.  Plan for outpatient cardiology follow-up and testing on a nonemergent basis with strict return to ED precautions.   Critical Interventions: Cardiology consultation   Cardiac Monitoring: The patient was maintained on a cardiac monitor.  I personally viewed and interpreted the cardiac monitored which showed an underlying rhythm of: Normal sinus rhythm   Reevaluation: After the interventions noted above, I reevaluated the patient and found that they have :resolved   Dispostion: Patient at this time appears safe and stable for discharge and close outpatient follow up. Discharge plan and strict return to ED precautions discussed, patient verbalizes understanding and agreement.        Final Clinical Impression(s) / ED Diagnoses Final diagnoses:  None    Rx / DC Orders ED Discharge Orders     None         Lorelle Gibbs, DO 05/08/21 1306

## 2021-05-08 NOTE — ED Notes (Signed)
Patient has a blue top in the main lab °

## 2021-05-09 NOTE — Progress Notes (Signed)
Subjective:   Patient ID: [name], male   DOB: 76 y.o.   MRN: [id number]   HPI Patient presents stating he has developed a lot of pain on top of his right second toe again and its inflamed and also has nail disease 1-5 both feet that are thick and he cannot cut.  States the toe is elevated in the air with moderate rigid contracture and it did do well at last treatment   ROS      Objective:  Physical Exam  Neurovascular status intact with inflammation fluid at the head of the proximal phalanx digit to right with elevation of the toe and moderate rigid contracture with elongated nailbeds 1-5 both feet that are thick and dystrophic     Assessment:  Inflammatory capsulitis of the second digit right head of proximal phalanx with fluid buildup and hammertoe deformity along with mycotic nail infection with pain 1-5 both feet     Plan:  H&P reviewed both conditions today I did sterile prep and injected carefully around the inner phalangeal joint digit to right 2 mg dexamethasone Kenalog 5 mg Xylocaine applied cushioning to take pressure off the toe debrided the lesion and debrided nailbeds 1-5 both feet with no iatrogenic bleeding and will be seen back and ultimately may require digital fusion which I explained to him today

## 2021-05-10 ENCOUNTER — Telehealth: Payer: Self-pay | Admitting: General Practice

## 2021-05-10 ENCOUNTER — Other Ambulatory Visit (HOSPITAL_COMMUNITY): Payer: Self-pay | Admitting: Cardiology

## 2021-05-10 ENCOUNTER — Other Ambulatory Visit: Payer: Self-pay | Admitting: Cardiology

## 2021-05-10 DIAGNOSIS — R079 Chest pain, unspecified: Secondary | ICD-10-CM

## 2021-05-10 DIAGNOSIS — I480 Paroxysmal atrial fibrillation: Secondary | ICD-10-CM | POA: Diagnosis not present

## 2021-05-10 DIAGNOSIS — D649 Anemia, unspecified: Secondary | ICD-10-CM | POA: Diagnosis not present

## 2021-05-10 DIAGNOSIS — I10 Essential (primary) hypertension: Secondary | ICD-10-CM | POA: Diagnosis not present

## 2021-05-10 DIAGNOSIS — I25118 Atherosclerotic heart disease of native coronary artery with other forms of angina pectoris: Secondary | ICD-10-CM | POA: Diagnosis not present

## 2021-05-10 DIAGNOSIS — E1169 Type 2 diabetes mellitus with other specified complication: Secondary | ICD-10-CM | POA: Diagnosis not present

## 2021-05-10 DIAGNOSIS — N189 Chronic kidney disease, unspecified: Secondary | ICD-10-CM | POA: Diagnosis not present

## 2021-05-10 NOTE — Telephone Encounter (Signed)
Transition Care Management Follow-up Telephone Call Date of discharge and from where: 05/08/21 from [facility] How have you been since you were released from the hospital? Doing better. Any questions or concerns? No  Items Reviewed: Did the pt receive and understand the discharge instructions provided? Yes  Medications obtained and verified? Yes  Other? No  Any new allergies since your discharge? No  Dietary orders reviewed? Yes Do you have support at home? Yes   Home Care and Equipment/Supplies: Were home health services ordered? no  Functional Questionnaire: (I = Independent and D = Dependent) ADLs: I  Bathing/Dressing- I  Meal Prep- I  Eating- I  Maintaining continence- I  Transferring/Ambulation- I  Managing Meds- I  Follow up appointments reviewed:  PCP Hospital f/u appt confirmed? No   Specialist Hospital f/u appt confirmed?  Patient will call and make an appointment with his cardiologist.   Are transportation arrangements needed? No  If their condition worsens, is the pt aware to call PCP or go to the Emergency Dept.? Yes Was the patient provided with contact information for the PCP's office or ED? Yes Was to pt encouraged to call back with questions or concerns? Yes

## 2021-05-11 DIAGNOSIS — I4891 Unspecified atrial fibrillation: Secondary | ICD-10-CM

## 2021-05-11 DIAGNOSIS — E1169 Type 2 diabetes mellitus with other specified complication: Secondary | ICD-10-CM | POA: Diagnosis not present

## 2021-05-11 DIAGNOSIS — I10 Essential (primary) hypertension: Secondary | ICD-10-CM

## 2021-05-11 DIAGNOSIS — E785 Hyperlipidemia, unspecified: Secondary | ICD-10-CM | POA: Diagnosis not present

## 2021-05-13 ENCOUNTER — Other Ambulatory Visit: Payer: Self-pay | Admitting: Family Medicine

## 2021-05-14 ENCOUNTER — Other Ambulatory Visit (HOSPITAL_COMMUNITY): Payer: Self-pay

## 2021-05-15 ENCOUNTER — Other Ambulatory Visit: Payer: Self-pay | Admitting: Family Medicine

## 2021-05-19 ENCOUNTER — Other Ambulatory Visit: Payer: Self-pay

## 2021-05-19 ENCOUNTER — Other Ambulatory Visit (HOSPITAL_COMMUNITY): Payer: Self-pay | Admitting: Cardiology

## 2021-05-19 ENCOUNTER — Encounter (HOSPITAL_COMMUNITY)
Admission: RE | Admit: 2021-05-19 | Discharge: 2021-05-19 | Disposition: A | Discharge status: Home / Self Care | Payer: HMO | Source: Ambulatory Visit | Attending: Cardiology | Admitting: Cardiology

## 2021-05-19 DIAGNOSIS — R0789 Other chest pain: Secondary | ICD-10-CM | POA: Diagnosis not present

## 2021-05-19 DIAGNOSIS — R079 Chest pain, unspecified: Secondary | ICD-10-CM | POA: Insufficient documentation

## 2021-05-19 MED ORDER — TECHNETIUM TC 99M TETROFOSMIN IV KIT
10.3000 | PACK | Freq: Once | INTRAVENOUS | Status: AC | PRN
  Administered 2021-05-19: 10.3 via INTRAVENOUS

## 2021-05-19 MED ORDER — REGADENOSON 0.4 MG/5ML IV SOLN
INTRAVENOUS | Status: AC
  Filled 2021-05-19: qty 5

## 2021-05-19 MED ORDER — REGADENOSON 0.4 MG/5ML IV SOLN
0.4000 mg | Freq: Once | INTRAVENOUS | Status: AC
  Administered 2021-05-19: 0.4 mg via INTRAVENOUS

## 2021-05-19 MED ORDER — TECHNETIUM TC 99M TETROFOSMIN IV KIT
31.7000 | PACK | Freq: Once | INTRAVENOUS | Status: AC | PRN
  Administered 2021-05-19: 31.7 via INTRAVENOUS

## 2021-05-30 ENCOUNTER — Other Ambulatory Visit: Payer: Self-pay | Admitting: Family Medicine

## 2021-06-07 ENCOUNTER — Ambulatory Visit (INDEPENDENT_AMBULATORY_CARE_PROVIDER_SITE_OTHER): Payer: HMO | Admitting: Endocrinology

## 2021-06-07 ENCOUNTER — Other Ambulatory Visit: Payer: Self-pay

## 2021-06-07 ENCOUNTER — Encounter: Payer: Self-pay | Admitting: Endocrinology

## 2021-06-07 VITALS — BP 160/62 | HR 65 | Ht 73.0 in | Wt 239.6 lb

## 2021-06-07 DIAGNOSIS — E11649 Type 2 diabetes mellitus with hypoglycemia without coma: Secondary | ICD-10-CM | POA: Diagnosis not present

## 2021-06-07 LAB — POCT GLYCOSYLATED HEMOGLOBIN (HGB A1C): Hemoglobin A1C: 6 % — AB (ref 4.0–5.6)

## 2021-06-07 NOTE — Progress Notes (Signed)
Subjective:    Patient ID: [name], male    DOB: [birth year], 76 y.o.   MRN: [id number]  HPI Pt returns for f/u of diabetes mellitus:  DM type: 2 Dx'ed: 5956 Complications: CAD and CRI Therapy: 2 oral meds DKA: never Severe hypoglycemia: never Pancreatitis: never Pancreatic imaging: normal on 2021 CT SDOH: he gets Januvia samples from Dr [name]--- as he cannot afford to buy it.   Other: he has never been on insulin; he has [place] continuous glucose monitor.   Interval history:  Glucose varies from 65-225.   He takes meds as rx'ed.  pt states he feels well in general.  He has mild hypoglycemia approx twice/month. I reviewed continuous glucose monitor data.  Glucose varies from 68-200, but there are large gaps in data, which are 54 mos old.   Past Medical History:  Diagnosis Date   Arthritis    Asthma    no inhaler   Atrial fibrillation ([place]) 10/16/2020   Coronary artery disease    cardiologist-  dr spruill; last visit 3 mos ago per pt   Enterobacter sepsis ([place]) 04/13/2020   GERD (gastroesophageal reflux disease)    History of MI (myocardial infarction)    1985   Hydronephrosis, left    Hypertension    Myocardial infarction ([place])    Nephrolithiasis    left   Neuromuscular disorder ([facility])    TINGLING IN BOTH HANDS   Presence of tooth-root and mandibular implants    lower dental implants   Prostate cancer ([place])    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   QT prolongation 10/16/2020   Shortness of breath    WITH EXERTION   Thigh abscess 04/13/2020   Type 2 diabetes mellitus ([place])     Past Surgical History:  Procedure Laterality Date   BUNIONECTOMY     CYSTOSCOPY W/ RETROGRADES Left 03/14/2014   Procedure: CYSTOSCOPY WITH LEFT RETROGRADE PYELOGRAM, Left Ureteroscopy, [place] ureteral Stent No string;  Surgeon: [name], MD;  Location: [facility] [facility];  Service: Urology;  Laterality: Left;   CYSTOSCOPY W/ URETERAL STENT PLACEMENT Bilateral 01/15/2020   Procedure:  CYSTOSCOPY WITH RETROGRADE PYELOGRAM/URETERAL STENT PLACEMENT;  Surgeon: Alexis Frock, MD;  Location: [facility];  Service: Urology;  Laterality: Bilateral;   CYSTOSCOPY/URETEROSCOPY/HOLMIUM LASER/STENT PLACEMENT Bilateral 03/17/2020   Procedure: CYSTOSCOPY/URETEROSCOPY/HOLMIUM LASER/STENT LEFT PLACEMENT;  Surgeon: Festus Aloe, MD;  Location: [facility];  Service: Urology;  Laterality: Bilateral;   HERNIA REPAIR     INCISION AND DRAINAGE Left 03/09/2020   Procedure: INCISION AND DRAINAGE LEFT HIP WITH LINER EXCHANGE;  Surgeon: Gaynelle Arabian, MD;  Location: [facility];  Service: Orthopedics;  Laterality: Left;   IR FLUORO GUIDE CV LINE RIGHT  03/10/2020   IR RADIOLOGIST EVAL & MGMT  04/01/2020   IR RADIOLOGIST EVAL & MGMT  04/15/2020   IR RADIOLOGIST EVAL & MGMT  04/30/2020   IR REMOVAL TUN CV CATH W/O FL  04/22/2020   IR US GUIDE BX ASP/DRAIN  03/17/2020   IR US GUIDE VASC ACCESS RIGHT  03/10/2020   PROSTATE BIOPSY     TOTAL HIP ARTHROPLASTY Left 03-20-2009   TOTAL HIP ARTHROPLASTY  04/09/2012   Procedure: TOTAL HIP ARTHROPLASTY;  Surgeon: [name], MD;  Location: [facility];  Service: Orthopedics;  Laterality: Right;   TOTAL KNEE ARTHROPLASTY Left 05-16-2008    Social History   Socioeconomic History   Marital status: Widowed    Spouse name: Not on file   Number of children:  Not on file   Years of education: Not on file   Highest education level: Not on file  Occupational History   Not on file  Tobacco Use   Smoking status: Former    Packs/day: 1.00    Years: 25.00    Pack years: 25.00    Types: Cigarettes    Quit date: 04/11/1984    Years since quitting: 37.1   Smokeless tobacco: Former    Quit date: 04/02/1985  Vaping Use   Vaping Use: Never used  Substance and Sexual Activity   Alcohol use: Not Currently    Comment: RARE   Drug use: No   Sexual activity: Not Currently  Other Topics Concern   Not on file  Social History Narrative   Not on file   Social Determinants of  Health   Financial Resource Strain: Not on file  Food Insecurity: Not on file  Transportation Needs: Not on file  Physical Activity: Not on file  Stress: Not on file  Social Connections: Not on file  Intimate Partner Violence: Not on file    Current Outpatient Medications on File Prior to Visit  Medication Sig Dispense Refill   allopurinol (ZYLOPRIM) 100 MG tablet TAKE 1/2 TABLET BY MOUTH EVERY DAY 45 tablet 1   amiodarone (PACERONE) 200 MG tablet Take 200 mg by mouth daily.     amLODipine (NORVASC) 10 MG tablet TAKE 1 TABLET BY MOUTH EVERY DAY 90 tablet 1   Aspirin 81 MG CAPS Take 81 mg by mouth daily.     clotrimazole-betamethasone (LOTRISONE) cream APPLY 1 APPLICATION TOPICALLY DAILY 30 g 0   colchicine 0.6 MG tablet TAKE 1 TABLET BY MOUTH EVERY DAY 90 tablet 1   colesevelam (WELCHOL) 625 MG tablet Take 2 tablets (1,250 mg total) by mouth daily. 180 tablet 3   Continuous Blood Gluc Sensor (FREESTYLE LIBRE 3 SENSOR) MISC 1 Device by Does not apply route every 14 (fourteen) days. Place 1 sensor on the skin every 14 days. Use to check glucose continuously 2 each 3   cycloSPORINE (RESTASIS) 0.05 % ophthalmic emulsion Place 1 drop into both eyes 2 (two) times daily.     docusate sodium (COLACE) 100 MG capsule Take 100 mg by mouth daily as needed for mild constipation.     ferrous sulfate 325 (65 FE) MG tablet Take 325 mg by mouth daily with breakfast.     hydrALAZINE (APRESOLINE) 25 MG tablet Take 1 tablet (25 mg total) by mouth 3 (three) times daily. 90 tablet 1   hydrOXYzine (VISTARIL) 25 MG capsule Take 1 capsule (25 mg total) by mouth 3 (three) times daily as needed for itching. 30 capsule 0   levofloxacin (LEVAQUIN) 500 MG tablet Take 1 tablet by mouth daily. 30 tablet 3   metoprolol succinate (TOPROL-XL) 50 MG 24 hr tablet Take 50 mg by mouth daily.     omeprazole (PRILOSEC) 40 MG capsule Take 40 mg by mouth daily.     rosuvastatin (CRESTOR) 40 MG tablet Take 40 mg by mouth daily.      sitaGLIPtin (JANUVIA) 50 MG tablet Take 1 tablet (50 mg total) by mouth daily. 30 tablet 0   gabapentin (NEURONTIN) 300 MG capsule Take 1 capsule (300 mg total) by mouth 2 (two) times daily. 60 capsule 0   No current facility-administered medications on file prior to visit.    Allergies  Allergen Reactions   Shellfish Allergy Rash    Family History  Problem Relation Age of Onset  Cancer Mother        breast   Multiple sclerosis Daughter    Cancer Father        prostate   Diabetes Father    Cancer Maternal Uncle        bone    BP (!) 160/62    Pulse 65    Ht 6\' 1"  (1.854 m)    Wt 239 lb 9.6 oz (108.7 kg)    SpO2 97%    BMI 31.61 kg/m    Review of Systems     Objective:   Physical Exam VITAL SIGNS:  See vs page GENERAL: no distress EXT: trace bilat leg edema.     A1c=6.0%    Assessment & Plan:  Type 2 DM: well-controlled Hypoglycemia: we discussed.  As this is mild, I advised same meds  Patient Instructions  check your blood sugar once a day.  vary the time of day when you check, between before the 3 meals, and at bedtime.  also check if you have symptoms of your blood sugar being too high or too low.  please keep a record of the readings and bring it to your next appointment here (or you can bring the meter itself).  You can write it on any piece of paper.  please call us sooner if your blood sugar goes below 70, or if most of your readings are over 200.   Please continue the same 2 diabetes medications.   Please come back for a follow-up appointment in 4 months.

## 2021-06-07 NOTE — Patient Instructions (Addendum)
check your blood sugar once a day.  vary the time of day when you check, between before the 3 meals, and at bedtime.  also check if you have symptoms of your blood sugar being too high or too low.  please keep a record of the readings and bring it to your next appointment here (or you can bring the meter itself).  You can write it on any piece of paper.  please call us sooner if your blood sugar goes below 70, or if most of your readings are over 200.   Please continue the same 2 diabetes medications.   Please come back for a follow-up appointment in 4 months.

## 2021-06-09 ENCOUNTER — Ambulatory Visit (INDEPENDENT_AMBULATORY_CARE_PROVIDER_SITE_OTHER): Payer: HMO | Admitting: Family Medicine

## 2021-06-09 ENCOUNTER — Encounter: Payer: Self-pay | Admitting: Family Medicine

## 2021-06-09 ENCOUNTER — Other Ambulatory Visit: Payer: Self-pay

## 2021-06-09 DIAGNOSIS — M25559 Pain in unspecified hip: Secondary | ICD-10-CM | POA: Diagnosis not present

## 2021-06-09 DIAGNOSIS — B029 Zoster without complications: Secondary | ICD-10-CM | POA: Insufficient documentation

## 2021-06-09 DIAGNOSIS — I10 Essential (primary) hypertension: Secondary | ICD-10-CM | POA: Diagnosis not present

## 2021-06-09 MED ORDER — ALBUTEROL SULFATE HFA 108 (90 BASE) MCG/ACT IN AERS: 2.0000 | INHALATION_SPRAY | Freq: Four times a day (QID) | RESPIRATORY_TRACT | 0 refills | Status: AC | PRN

## 2021-06-09 MED ORDER — HYDRALAZINE HCL 10 MG PO TABS: 20.0000 mg | ORAL_TABLET | Freq: Three times a day (TID) | ORAL | 2 refills | Status: DC

## 2021-06-09 NOTE — Assessment & Plan Note (Signed)
Blood pressure remains elevated.  Reports readings at home have been better compared to in office readings.  Tolerating hydralazine well at this time.  We will plan to continue. ?

## 2021-06-09 NOTE — Assessment & Plan Note (Signed)
History of infected prosthesis.  He does plan to follow-up with orthopedics in the next few days for this. ?

## 2021-06-09 NOTE — Progress Notes (Signed)
?DEMOSTHENES VIRNIG - 76 y.o. male MRN [id number]  Date of birth: 06-Dec-1945 ? ?Subjective ?Chief Complaint  ?Patient presents with  ? Acute Visit  ? Rash  ? ? ?HPI ?CALEL PISARSKI is a 76 year old male here today with complaint of rash.  He is also having increased left hip pain.  He does have upcoming visit with orthopedics for this. ? ?Rash located on upper arm.  Started a couple weeks ago.  Initially was very painful with some blistering like areas.  Pain has improved but he has some itching around these areas now.  He has not had fever or chills.  Was recently started on hydralazine. ? ?Started on hydralazine recently.  He had hyperkalemia with losartan.  He is taking this regularly.  He denies any symptoms related to hypertension including chest pain, shortness of breath, palpitations, headaches or vision changes. ? ?ROS:  A comprehensive ROS was completed and negative except as noted per HPI ? ?Allergies  ?Allergen Reactions  ? Shellfish Allergy Rash  ? ? ?Past Medical History:  ?Diagnosis Date  ? Arthritis   ? Asthma   ? no inhaler  ? Atrial fibrillation ([place]) 10/16/2020  ? Coronary artery disease   ? cardiologist-  dr spruill; last visit 3 mos ago per pt  ? Enterobacter sepsis ([place]) 04/13/2020  ? GERD (gastroesophageal reflux disease)   ? History of MI (myocardial infarction)   ? 1985  ? Hydronephrosis, left   ? Hypertension   ? Myocardial infarction [facility])   ? Nephrolithiasis   ? left  ? Neuromuscular disorder ([place])   ? TINGLING IN BOTH HANDS  ? Presence of tooth-root and mandibular implants   ? lower dental implants  ? Prostate cancer ([place])   ? 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 QT prolongation 10/16/2020  ? Shortness of breath   ? WITH EXERTION  ? Thigh abscess 04/13/2020  ? Type 2 diabetes mellitus ([place])   ? ? ?Past Surgical History:  ?Procedure Laterality Date  ? BUNIONECTOMY    ? CYSTOSCOPY W/ RETROGRADES Left 03/14/2014  ? Procedure: CYSTOSCOPY WITH LEFT RETROGRADE PYELOGRAM, Left Ureteroscopy, Lweft  ureteral Stent No string;  Surgeon: [name], MD;  Location: [place];  Service: Urology;  Laterality: Left;  ? CYSTOSCOPY W/ URETERAL STENT PLACEMENT Bilateral 01/15/2020  ? Procedure: CYSTOSCOPY WITH RETROGRADE PYELOGRAM/URETERAL STENT PLACEMENT;  Surgeon: Alexis Frock, MD;  [facility];  Service: Urology;  Laterality: Bilateral;  ? CYSTOSCOPY/URETEROSCOPY/HOLMIUM LASER/STENT PLACEMENT Bilateral 03/17/2020  ? Procedure: CYSTOSCOPY/URETEROSCOPY/HOLMIUM LASER/STENT LEFT PLACEMENT;  Surgeon: Festus Aloe, MD;  [facility];  Service: Urology;  Laterality: Bilateral;  ? HERNIA REPAIR    ? INCISION AND DRAINAGE Left 03/09/2020  ? Procedure: INCISION AND DRAINAGE LEFT HIP WITH LINER EXCHANGE;  Surgeon: Gaynelle Arabian, MD;  [facility];  Service: Orthopedics;  Laterality: Left;  ? IR FLUORO GUIDE CV LINE RIGHT  03/10/2020  ? IR RADIOLOGIST EVAL & MGMT  04/01/2020  ? IR RADIOLOGIST EVAL & MGMT  04/15/2020  ? IR RADIOLOGIST EVAL & MGMT  04/30/2020  ? IR REMOVAL TUN CV CATH W/O FL  04/22/2020  ? IR US GUIDE BX ASP/DRAIN  03/17/2020  ? IR US GUIDE VASC ACCESS RIGHT  03/10/2020  ? PROSTATE BIOPSY    ? TOTAL HIP ARTHROPLASTY Left 03-20-2009  ? TOTAL HIP ARTHROPLASTY  04/09/2012  ? Procedure: TOTAL HIP ARTHROPLASTY;  Surgeon: [name], MD;  [facility];  Service: Orthopedics;  Laterality: Right;  ?  TOTAL KNEE ARTHROPLASTY Left 05-16-2008  ? ? ?Social History  ? ?Socioeconomic History  ? Marital status: Widowed  ?  Spouse name: Not on file  ? Number of children: Not on file  ? Years of education: Not on file  ? Highest education level: Not on file  ?Occupational History  ? Not on file  ?Tobacco Use  ? Smoking status: Former  ?  Packs/day: 1.00  ?  Years: 25.00  ?  Pack years: 25.00  ?  Types: Cigarettes  ?  Quit date: 04/11/1984  ?  Years since quitting: 37.1  ? Smokeless tobacco: Former  ?  Quit date: 04/02/1985  ?Vaping Use  ? Vaping Use: Never used  ?Substance and Sexual Activity   ? Alcohol use: Not Currently  ?  Comment: RARE  ? Drug use: No  ? Sexual activity: Not Currently  ?Other Topics Concern  ? Not on file  ?Social History Narrative  ? Not on file  ? ?Social Determinants of Health  ? ?Financial Resource Strain: Not on file  ?Food Insecurity: Not on file  ?Transportation Needs: Not on file  ?Physical Activity: Not on file  ?Stress: Not on file  ?Social Connections: Not on file  ? ? ?Family History  ?Problem Relation Age of Onset  ? Cancer Mother   ?     breast  ? Multiple sclerosis Daughter   ? Cancer Father   ?     prostate  ? Diabetes Father   ? Cancer Maternal Uncle   ?     bone  ? ? ?Health Maintenance  ?Topic Date Due  ? Zoster Vaccines- Shingrix (1 of 2) Never done  ? COVID-19 Vaccine (2 - Moderna risk series) 04/15/2020  ? OPHTHALMOLOGY EXAM  10/05/2021  ? FOOT EXAM  12/02/2021  ? HEMOGLOBIN A1C  12/05/2021  ? TETANUS/TDAP  04/10/2025  ? COLONOSCOPY (Pts 45-83yrs Insurance coverage will need to be confirmed)  11/15/2028  ? Pneumonia Vaccine 39+ Years old  Completed  ? INFLUENZA VACCINE  Completed  ? Hepatitis C Screening  Completed  ? HPV VACCINES  Aged Out  ? ? ? ?----------------------------------------------------------------------------------------------------------------------------------------------------------------------------------------------------------------- ?Physical Exam ?BP (!) 153/62   Pulse 65   Temp 98.3 ?F (36.8 ?C)   Ht 6\' 2"  (1.88 m)   Wt 239 lb (108.4 kg)   SpO2 98%   BMI 30.69 kg/m?  ? ?Physical Exam ?Constitutional:   ?   Appearance: Normal appearance.  ?Cardiovascular:  ?   Rate and Rhythm: Normal rate and regular rhythm.  ?Neurological:  ?   Mental Status: He is alert.  ? ? ? ? ?------------------------------------------------------------------------------------------------------------------------------------------------------------------------------------------------------------------- ?Assessment and Plan ? ?Shingles ?Symptoms and rash  consistent with shingles.  This is improving at this point.  I will think antivirals would provide any additional benefit at this time. ? ?Hip pain ?History of infected prosthesis.  He does plan to follow-up with orthopedics in the next few days for this. ? ?Essential hypertension ?Blood pressure remains elevated.  Reports readings at home have been better compared to in office readings.  Tolerating hydralazine well at this time.  We will plan to continue. ? ? ?No orders of the defined types were placed in this encounter. ? ? ?No follow-ups on file. ? ? ? ?This visit occurred during the SARS-CoV-2 public health emergency.  Safety protocols were in place, including screening questions prior to the visit, additional usage of staff PPE, and extensive cleaning of exam room while observing appropriate contact time as  indicated for disinfecting solutions.  ? ?

## 2021-06-09 NOTE — Assessment & Plan Note (Signed)
Symptoms and rash consistent with shingles.  This is improving at this point.  I will think antivirals would provide any additional benefit at this time. ?

## 2021-06-09 NOTE — Patient Instructions (Signed)

## 2021-06-14 ENCOUNTER — Ambulatory Visit: Payer: HMO | Admitting: Family Medicine

## 2021-06-15 ENCOUNTER — Other Ambulatory Visit (HOSPITAL_COMMUNITY): Payer: Self-pay

## 2021-06-16 DIAGNOSIS — N1831 Chronic kidney disease, stage 3a: Secondary | ICD-10-CM | POA: Diagnosis not present

## 2021-06-16 DIAGNOSIS — Z8546 Personal history of malignant neoplasm of prostate: Secondary | ICD-10-CM | POA: Diagnosis not present

## 2021-06-18 DIAGNOSIS — M1612 Unilateral primary osteoarthritis, left hip: Secondary | ICD-10-CM | POA: Diagnosis not present

## 2021-06-21 DIAGNOSIS — I10 Essential (primary) hypertension: Secondary | ICD-10-CM | POA: Diagnosis not present

## 2021-06-21 DIAGNOSIS — R351 Nocturia: Secondary | ICD-10-CM | POA: Diagnosis not present

## 2021-06-21 DIAGNOSIS — N401 Enlarged prostate with lower urinary tract symptoms: Secondary | ICD-10-CM | POA: Diagnosis not present

## 2021-06-21 DIAGNOSIS — E785 Hyperlipidemia, unspecified: Secondary | ICD-10-CM | POA: Diagnosis not present

## 2021-06-21 DIAGNOSIS — I208 Other forms of angina pectoris: Secondary | ICD-10-CM | POA: Diagnosis not present

## 2021-06-21 DIAGNOSIS — I480 Paroxysmal atrial fibrillation: Secondary | ICD-10-CM | POA: Diagnosis not present

## 2021-06-21 DIAGNOSIS — Z8546 Personal history of malignant neoplasm of prostate: Secondary | ICD-10-CM | POA: Diagnosis not present

## 2021-06-21 DIAGNOSIS — E119 Type 2 diabetes mellitus without complications: Secondary | ICD-10-CM | POA: Diagnosis not present

## 2021-06-23 ENCOUNTER — Other Ambulatory Visit: Payer: Self-pay | Admitting: Family Medicine

## 2021-06-23 ENCOUNTER — Other Ambulatory Visit: Payer: Self-pay | Admitting: Endocrinology

## 2021-06-25 DIAGNOSIS — I10 Essential (primary) hypertension: Secondary | ICD-10-CM | POA: Diagnosis not present

## 2021-06-25 DIAGNOSIS — E119 Type 2 diabetes mellitus without complications: Secondary | ICD-10-CM | POA: Diagnosis not present

## 2021-06-25 DIAGNOSIS — E785 Hyperlipidemia, unspecified: Secondary | ICD-10-CM | POA: Diagnosis not present

## 2021-07-04 ENCOUNTER — Other Ambulatory Visit: Payer: Self-pay | Admitting: Family Medicine

## 2021-07-09 ENCOUNTER — Encounter: Payer: Self-pay | Admitting: Family Medicine

## 2021-07-09 ENCOUNTER — Ambulatory Visit (INDEPENDENT_AMBULATORY_CARE_PROVIDER_SITE_OTHER): Payer: HMO | Admitting: Family Medicine

## 2021-07-09 VITALS — BP 153/61 | HR 60 | Ht 74.0 in | Wt 232.0 lb

## 2021-07-09 DIAGNOSIS — E1165 Type 2 diabetes mellitus with hyperglycemia: Secondary | ICD-10-CM

## 2021-07-09 DIAGNOSIS — I739 Peripheral vascular disease, unspecified: Secondary | ICD-10-CM | POA: Diagnosis not present

## 2021-07-09 DIAGNOSIS — I10 Essential (primary) hypertension: Secondary | ICD-10-CM | POA: Diagnosis not present

## 2021-07-09 MED ORDER — PREGABALIN 100 MG PO CAPS: 100.0000 mg | ORAL_CAPSULE | Freq: Two times a day (BID) | ORAL | 3 refills | Status: DC

## 2021-07-09 NOTE — Patient Instructions (Signed)
Stop gabpentin and methocarbamol.  ? ?Start lyrica '100mg'$  twice per day.  ? ?I have ordered updated study for circulation.  ? ?See me again in 3-4 months.  ?

## 2021-07-10 NOTE — Assessment & Plan Note (Signed)
He has had worsening claudication symptoms.  Updating ABIs.  There may be some neuropathy as well which is contributing I will try switching gabapentin to Lyrica to see if this is more effective for him. ?

## 2021-07-10 NOTE — Assessment & Plan Note (Signed)
Blood pressure remains elevated here in clinic.  Reports readings at home have been better controlled.  We will not make any changes to his medications at this time. ?

## 2021-07-10 NOTE — Progress Notes (Signed)
?[name] - 76 y.o. male MRN [id number]  Date of birth: [birth year] ? ?Subjective ?Chief Complaint  ?Patient presents with  ? Hypertension  ? ? ?HPI ?[name] is a 76 year old male here today for follow-up visit.  He is having some increased pain in his lower extremities.  Describes cramping sensation throughout the legs and calf.  This is all worse with walking.  He did have ABIs back in 2020 which did show some vascular disease without significant stenotic lesions or occlusion.  Additionally he does have some numbness and tingling with this which is likely from neuropathy.  He does not feel the gabapentin is very effective. ? ?Blood pressure does not remain mildly elevated.  He is doing well with current medications.  He has not had chest pain, shortness of breath, palpitations, headaches or vision changes. ? ?ROS:  A comprehensive ROS was completed and negative except as noted per HPI ? ?Allergies  ?Allergen Reactions  ? Shellfish Allergy Rash  ? ? ?Past Medical History:  ?Diagnosis Date  ? Arthritis   ? Asthma   ? no inhaler  ? Atrial fibrillation ([place]) 10/16/2020  ? Coronary artery disease   ? cardiologist-  dr spruill; last visit 3 mos ago per pt  ? Enterobacter sepsis ([place]) 04/13/2020  ? GERD (gastroesophageal reflux disease)   ? History of MI (myocardial infarction)   ? 1985  ? Hydronephrosis, left   ? Hypertension   ? Myocardial infarction [facility])   ? Nephrolithiasis   ? left  ? Neuromuscular disorder ([place])   ? TINGLING IN BOTH HANDS  ? Presence of tooth-root and mandibular implants   ? lower dental implants  ? Prostate cancer ([place])   ? 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 QT prolongation 10/16/2020  ? Shortness of breath   ? WITH EXERTION  ? Thigh abscess 04/13/2020  ? Type 2 diabetes mellitus ([place])   ? ? ?Past Surgical History:  ?Procedure Laterality Date  ? BUNIONECTOMY    ? CYSTOSCOPY W/ RETROGRADES Left 03/14/2014  ? Procedure: CYSTOSCOPY WITH LEFT RETROGRADE PYELOGRAM, Left Ureteroscopy,  Lweft ureteral Stent No string;  Surgeon: [name], MD;  Location: [place];  Service: Urology;  Laterality: Left;  ? CYSTOSCOPY W/ URETERAL STENT PLACEMENT Bilateral 01/15/2020  ? Procedure: CYSTOSCOPY WITH RETROGRADE PYELOGRAM/URETERAL STENT PLACEMENT;  Surgeon: Alexis Frock, MD;  [facility];  Service: Urology;  Laterality: Bilateral;  ? CYSTOSCOPY/URETEROSCOPY/HOLMIUM LASER/STENT PLACEMENT Bilateral 03/17/2020  ? Procedure: CYSTOSCOPY/URETEROSCOPY/HOLMIUM LASER/STENT LEFT PLACEMENT;  Surgeon: Festus Aloe, MD;  [facility];  Service: Urology;  Laterality: Bilateral;  ? HERNIA REPAIR    ? INCISION AND DRAINAGE Left 03/09/2020  ? Procedure: INCISION AND DRAINAGE LEFT HIP WITH LINER EXCHANGE;  Surgeon: Gaynelle Arabian, MD;  [facility];  Service: Orthopedics;  Laterality: Left;  ? IR FLUORO GUIDE CV LINE RIGHT  03/10/2020  ? IR RADIOLOGIST EVAL & MGMT  04/01/2020  ? IR RADIOLOGIST EVAL & MGMT  04/15/2020  ? IR RADIOLOGIST EVAL & MGMT  04/30/2020  ? IR REMOVAL TUN CV CATH W/O FL  04/22/2020  ? IR US GUIDE BX ASP/DRAIN  03/17/2020  ? IR US GUIDE VASC ACCESS RIGHT  03/10/2020  ? PROSTATE BIOPSY    ? TOTAL HIP ARTHROPLASTY Left 03-20-2009  ? TOTAL HIP ARTHROPLASTY  04/09/2012  ? Procedure: TOTAL HIP ARTHROPLASTY;  Surgeon: [name], MD;  [facility];  Service: Orthopedics;  Laterality: Right;  ? TOTAL KNEE ARTHROPLASTY  Left 05-16-2008  ? ? ?Social History  ? ?Socioeconomic History  ? Marital status: Widowed  ?  Spouse name: Not on file  ? Number of children: Not on file  ? Years of education: Not on file  ? Highest education level: Not on file  ?Occupational History  ? Not on file  ?Tobacco Use  ? Smoking status: Former  ?  Packs/day: 1.00  ?  Years: 25.00  ?  Pack years: 25.00  ?  Types: Cigarettes  ?  Quit date: 04/11/1984  ?  Years since quitting: 37.2  ? Smokeless tobacco: Former  ?  Quit date: 04/02/1985  ?Vaping Use  ? Vaping Use: Never used  ?Substance and Sexual  Activity  ? Alcohol use: Not Currently  ?  Comment: RARE  ? Drug use: No  ? Sexual activity: Not Currently  ?Other Topics Concern  ? Not on file  ?Social History Narrative  ? Not on file  ? ?Social Determinants of Health  ? ?Financial Resource Strain: Not on file  ?Food Insecurity: Not on file  ?Transportation Needs: Not on file  ?Physical Activity: Not on file  ?Stress: Not on file  ?Social Connections: Not on file  ? ? ?Family History  ?Problem Relation Age of Onset  ? Cancer Mother   ?     breast  ? Multiple sclerosis Daughter   ? Cancer Father   ?     prostate  ? Diabetes Father   ? Cancer Maternal Uncle   ?     bone  ? ? ?Health Maintenance  ?Topic Date Due  ? COVID-19 Vaccine (3 - Mixed Product risk series) 12/10/2021 (Originally 04/07/2021)  ? Zoster Vaccines- Shingrix (1 of 2) 12/10/2021 (Originally 08/05/1964)  ? OPHTHALMOLOGY EXAM  10/05/2021  ? INFLUENZA VACCINE  11/09/2021  ? FOOT EXAM  12/02/2021  ? HEMOGLOBIN A1C  12/05/2021  ? TETANUS/TDAP  04/10/2025  ? COLONOSCOPY (Pts 45-67yr Insurance coverage will need to be confirmed)  11/15/2028  ? Pneumonia Vaccine 76 Years old  Completed  ? Hepatitis C Screening  Completed  ? HPV VACCINES  Aged Out  ? ? ? ?----------------------------------------------------------------------------------------------------------------------------------------------------------------------------------------------------------------- ?Physical Exam ?BP (!) 153/61 (BP Location: Left Arm, Patient Position: Sitting, Cuff Size: Large)   Pulse 60   Ht '6\' 2"'$  (1.88 m)   Wt 232 lb (105.2 kg)   SpO2 98%   BMI 29.79 kg/m?  ? ?Physical Exam ?Constitutional:   ?   Appearance: Normal appearance.  ?Eyes:  ?   General: No scleral icterus. ?Cardiovascular:  ?   Rate and Rhythm: Normal rate and regular rhythm.  ?Pulmonary:  ?   Effort: Pulmonary effort is normal.  ?   Breath sounds: Normal breath sounds.  ?Musculoskeletal:  ?   Cervical back: Neck supple.  ?Neurological:  ?   Mental Status:  He is alert.  ?Psychiatric:     ?   Mood and Affect: Mood normal.     ?   Behavior: Behavior normal.  ? ? ?------------------------------------------------------------------------------------------------------------------------------------------------------------------------------------------------------------------- ?Assessment and Plan ? ?Essential hypertension ?Blood pressure remains elevated here in clinic.  Reports readings at home have been better controlled.  We will not make any changes to his medications at this time. ? ?Uncontrolled type 2 diabetes mellitus with hyperglycemia, without long-term [place] use of insulin [place] ?Current management per endocrinology.  Stable at this time. ? ?Intermittent claudication ([place] ?He has had worsening claudication symptoms.  Updating ABIs.  There may be some neuropathy as well which is  contributing I will try switching gabapentin to Lyrica to see if this is more effective for him. ? ? ?Meds ordered this encounter  ?Medications  ? pregabalin (LYRICA) 100 MG capsule  ?  Sig: Take 1 capsule (100 mg total) by mouth 2 (two) times daily.  ?  Dispense:  60 capsule  ?  Refill:  3  ? ? ?Return in about 4 months (around 11/08/2021) for HTN. ? ? ? ?This visit occurred during the SARS-CoV-2 public health emergency.  Safety protocols were in place, including screening questions prior to the visit, additional usage of staff PPE, and extensive cleaning of exam room while observing appropriate contact time as indicated for disinfecting solutions.  ? ?

## 2021-07-10 NOTE — Assessment & Plan Note (Signed)
Current management per endocrinology.  Stable at this time. ?

## 2021-07-15 ENCOUNTER — Other Ambulatory Visit: Payer: Self-pay | Admitting: Infectious Disease

## 2021-07-15 ENCOUNTER — Other Ambulatory Visit (HOSPITAL_COMMUNITY): Payer: Self-pay

## 2021-07-15 MED ORDER — LEVOFLOXACIN 500 MG PO TABS
500.0000 mg | ORAL_TABLET | Freq: Every day | ORAL | 3 refills | Status: DC
  Filled 2021-07-15: qty 30, 30d supply, fill #0
  Filled 2021-08-13: qty 30, 30d supply, fill #1

## 2021-07-15 NOTE — Telephone Encounter (Signed)
Please advise if okay to refill. ? ?Abbeygail Igoe P Madelline Eshbach, CMA ? ?

## 2021-07-16 ENCOUNTER — Other Ambulatory Visit (HOSPITAL_COMMUNITY): Payer: Self-pay

## 2021-07-18 ENCOUNTER — Other Ambulatory Visit: Payer: Self-pay | Admitting: Family Medicine

## 2021-07-18 DIAGNOSIS — I10 Essential (primary) hypertension: Secondary | ICD-10-CM

## 2021-07-21 ENCOUNTER — Telehealth: Payer: HMO

## 2021-08-03 ENCOUNTER — Ambulatory Visit (HOSPITAL_COMMUNITY)
Admission: RE | Admit: 2021-08-03 | Discharge: 2021-08-03 | Disposition: A | Discharge status: Home / Self Care | Payer: HMO | Source: Ambulatory Visit | Attending: Cardiology | Admitting: Cardiology

## 2021-08-03 DIAGNOSIS — I739 Peripheral vascular disease, unspecified: Secondary | ICD-10-CM | POA: Diagnosis not present

## 2021-08-13 ENCOUNTER — Other Ambulatory Visit (HOSPITAL_COMMUNITY): Payer: Self-pay

## 2021-08-28 ENCOUNTER — Other Ambulatory Visit: Payer: Self-pay | Admitting: Family Medicine

## 2021-09-10 ENCOUNTER — Ambulatory Visit (INDEPENDENT_AMBULATORY_CARE_PROVIDER_SITE_OTHER): Payer: PPO | Admitting: Pharmacist

## 2021-09-10 DIAGNOSIS — E785 Hyperlipidemia, unspecified: Secondary | ICD-10-CM

## 2021-09-10 DIAGNOSIS — I10 Essential (primary) hypertension: Secondary | ICD-10-CM

## 2021-09-10 DIAGNOSIS — I4891 Unspecified atrial fibrillation: Secondary | ICD-10-CM

## 2021-09-10 DIAGNOSIS — E11649 Type 2 diabetes mellitus with hypoglycemia without coma: Secondary | ICD-10-CM

## 2021-09-10 NOTE — Patient Instructions (Signed)
Visit Information  Thank you for taking time to visit with me today. Please don't hesitate to contact me if I can be of assistance to you before our next scheduled telephone appointment.  Following are the goals we discussed today:   Patient Goals/Self-Care Activities Over the next 60 days, patient will:  take medications as prescribed and check blood pressure daily, document, and provide at future appointments  Follow Up Plan: Telephone follow up appointment with care management team member scheduled for: 2 months  Please call the care guide team at [phone] if you need to cancel or reschedule your appointment.    The patient verbalized understanding of instructions, educational materials, and care plan provided today and agreed to receive a mailed copy of patient instructions, educational materials, and care plan.   Briane Birden J Coley Kulikowski  

## 2021-09-10 NOTE — Progress Notes (Signed)
Chronic Care Management Pharmacy Note  09/10/2021 Name:  [name] MRN:  [id number] DOB:  03-08-1946  Summary: addressed DM, HTN, HLD, Afib.   Of note, he had one episode of hypoglycemia recently, Aug 28, 2021. He stated this was attributed to him "mixing up his medicines" and accidentally took two of a medication, though he was unsure which medication. Hypoglycemia was self-resolving.Counseled on hypoglycemia management.  Glucose per CGM:  Date of Download: 09/10/21 % Time CGM is active: 51% Average Glucose: 134 mg/dL Glucose Management Indicator: 6.5  Glucose Variability: 17.4 (goal <36%) Time in Goal:  - Time in range 70-180: 97% - Time above range: 3% - Time below range: 0% Observed patterns: breakfast  Cereal, sometimes eggs, sometimes oatmeal  Date of Download: 04/29/21 % Time CGM is active: 98% Average Glucose: 113 mg/dL Glucose Management Indicator: 6% Glucose Variability: 20.5 (goal <36%) Time in Goal:  - Time in range 70-180: 99% - Time above range: 1% - Time below range: 0% Observed patterns:  BP numbers as follows:  149/58 149/65 157/73 156/77 144/68 148/67 152/77 144/69 When asked for "lowest he has seen:" 137/63  ------------------------- Previous readings from our last discussion: 152/67 145/75, 170/85 146/72 149/78 160/78    Recommendations/Changes made from today's visit:  - Continue current medications - For future BP lowering, may consider hydralazine 157m BID, or converting metoprolol XL 584mto carvedilol 12.57m44mID (for alpha and beta activity as opposed to beta-selective agent)  Plan: f/u with pharmacist in 2 months  Subjective: LeoFRAZIER [name] an 76 35o. year old male who is a primary patient of MatLuetta NuttingO.  The CCM team was consulted for assistance with disease management and care coordination needs.    Engaged with patient face to face for follow up visit in response to provider referral for pharmacy case  management and/or care coordination services.   Consent to Services:  The patient was given information about Chronic Care Management services, agreed to services, and gave verbal consent prior to initiation of services.  Please see initial visit note for detailed documentation.   Patient Care Team: MatLuetta NuttingO as PCP - General (Family Medicine) HarCharolette ForwardD as Consulting Physician (Cardiology) [facility] Pharmacist (Pharmacist)  Recent office visits:  09/04/20-Aaron MatZigmund DanielO (PCP) Follow up visit. Freestyle Libre ordered. Follow up in 3 months. 08/07/20-Aaron MatZigmund DanielO (PCP) Hypertension follow up. Start clotrimazole/betamethasone. Labs ordered. Follow up in 4 weeks. 06/15/20-Aaron MatZigmund DanielO (PCP) Diabetic follow up. Follow up in 2 months.   Recent consult visits:  10/22/20-Aaron N. VanTommy MedalD (Infectious Disease) Follow up visit. Follow up in 2 months. 10/16/20-Aaron N. VanTommy MedalD (Infectious Disease) Follow up visit. Start on levofloxacin at 500 mg. 10/05/20-Aaron ThoHazle Nordmannphthalmology) Diabetic Eye exam. 09/18/20-Aaron N. VanTommy MedalD (Infectious Disease) Follow up visit. Labs ordered. Follow up in 2 months. 09/04/20-Aaron EllLoanne DrillingD (Endocrinology) Diabetic follow up. Decrease Glimepiride 1 mg daily. 08/26/20-Aaron N. VanTommy MedalD (Infectious Disease) Follow up visit. Labs ordered. 07/03/20-Aaron EllLoanne DrillingD (Endocrinology) Diabetic follow up. Reduce the glimepiride to 2 mg each morning. Follow up in 2 months. 06/26/20-Aaron N. VanTommy MedalD (Infectious Disease) Follow up visit. Labs ordered. Follow up in 2 months. 06/26/20-[name], [name] (Podiatry) Seen for bunion.   Hospital visits:  Medication Reconciliation was completed by comparing discharge summary, patient's EMR and Pharmacy list, and upon discussion with patient.   Admitted to the hospital on 08/01/20 due to Chest  pain. Discharge date was 08/01/20. Discharged  from [facility]?Medications Started at [facility] Discharge:?? -started None noted   Medication Changes at Hospital Discharge: -Changed None noted   Medications Discontinued at Hospital Discharge: -Stopped None noted   Medications that remain the same after Hospital Discharge:??  -All other medications will remain the same.    Objective:  Lab Results  Component Value Date   CREATININE 1.59 (H) 05/08/2021   CREATININE 1.73 (H) 04/21/2021   CREATININE 1.57 (H) 04/16/2021    Lab Results  Component Value Date   HGBA1C 6.0 (A) 06/07/2021   Last diabetic Eye exam:  Lab Results  Component Value Date/Time   HMDIABEYEEXA No Retinopathy 10/05/2020 12:00 AM       Component Value Date/Time   CHOL 159 08/12/2020 0855   TRIG 110 08/12/2020 0855   HDL 41 08/12/2020 0855   CHOLHDL 5.0 (H) 05/18/2020 0000   VLDL 34.6 10/23/2018 1018   LDLCALC 97 08/12/2020 0855   LDLCALC 146 (H) 05/18/2020 0000   LDLDIRECT 132 (H) 10/24/2019 0807       Latest Ref Rng & Units 08/26/2020    3:14 PM 08/12/2020    8:55 AM 05/18/2020   12:00 AM  Hepatic Function  Total Protein 6.1 - 8.1 g/dL 7.1    7.6    Albumin 3.5 - 5.0  4.2        AST 10 - 35 U/L _0 ALT 9 - 46 U/L _1 Alk Phosphatase 25 - 125  89        Total Bilirubin 0.2 - 1.2 mg/dL 0.2    0.3    Bilirubin, Direct 0.0 - 0.2 mg/dL   0.1       This result is from an external source.        Latest Ref Rng & Units 05/08/2021    4:20 AM 03/22/2021   10:13 AM 12/25/2020    8:57 AM  CBC  WBC 4.0 - 10.5 K/uL 5.2   5.8   4.9    Hemoglobin 13.0 - 17.0 g/dL 9.3   10.4   10.7    Hematocrit 39.0 - 52.0 % 28.0   30.6   32.3    Platelets 150 - 400 K/uL 329   315   330      No results found for: VD25OH  Clinical ASCVD: Yes  The 10-year ASCVD risk score (Arnett DK, et al., 2019) is: 49.7%*   Values used to calculate the score:     Age: 76 years     Sex: Male     Is Non-Hispanic  African American: Yes     Diabetic: Yes     Tobacco smoker: No     Systolic Blood Pressure: 470 mmHg     Is BP treated: Yes     HDL Cholesterol: 41 mg/dL*     Total Cholesterol: 159 mg/dL*     * - Cholesterol units were assumed for this score calculation     Social History   Tobacco Use  Smoking Status Former   Packs/day: 1.00   Years: 25.00   Pack years: 25.00   Types: Cigarettes   Quit date: 04/11/1984   Years since quitting: 37.4  Smokeless Tobacco Former   Quit date: 04/02/1985   BP Readings from Last 3 Encounters:  07/09/21 Marland Kitchen)  153/61  06/09/21 138/62  06/07/21 (!) 160/62   Pulse Readings from Last 3 Encounters:  07/09/21 60  06/09/21 65  06/07/21 65   Wt Readings from Last 3 Encounters:  07/09/21 232 lb (105.2 kg)  06/09/21 239 lb (108.4 kg)  06/07/21 239 lb 9.6 oz (108.7 kg)    Assessment: Review of patient past medical history, allergies, medications, health status, including review of consultants reports, laboratory and other test data, was performed as part of comprehensive evaluation and provision of chronic care management services.   SDOH:  (Social Determinants of Health) assessments and interventions performed:    CCM Care Plan  Allergies  Allergen Reactions   Shellfish Allergy Rash    Medications Reviewed Today     Reviewed by Leontine Locket, [place] (Certified Medical Assistant) on 07/09/21 at [phone]  Med List Status: <None>   Medication Order Taking? Sig Documenting Provider Last Dose Status Informant  albuterol (VENTOLIN HFA) 108 (90 Base) MCG/ACT inhaler [id number] Yes Inhale 2 puffs into the lungs every 6 (six) hours as needed for wheezing or shortness of breath. Luetta Nutting, DO Taking Active   allopurinol (ZYLOPRIM) 100 MG tablet [id number] Yes TAKE 1/2 TABLET BY MOUTH EVERY DAY Luetta Nutting, DO Taking Active   amiodarone (PACERONE) 200 MG tablet [id number] Yes Take 200 mg by mouth daily. [provider] Taking Active   amLODipine  (NORVASC) 10 MG tablet [id number] Yes TAKE 1 TABLET BY MOUTH EVERY DAY Luetta Nutting, DO Taking Active   Aspirin 81 MG CAPS [id number] Yes Take 81 mg by mouth daily. [provider] Taking Active            Med Note Juliane Poot Jan 14, 2020 10:39 AM)    clotrimazole-betamethasone Donalynn Furlong) cream [id number] Yes APPLY 1 APPLICATION TOPICALLY DAILY Luetta Nutting, DO Taking Active   colchicine 0.6 MG tablet [id number] Yes TAKE 1 TABLET BY MOUTH EVERY DAY Luetta Nutting, DO Taking Active   colesevelam [facility]) 625 MG tablet [id number] Yes TAKE 2 TABLETS (1,250 MG TOTAL) BY MOUTH DAILY. Renato Shin, MD Taking Active   Continuous Blood Gluc Sensor (FREESTYLE LIBRE 3 SENSOR) [place] [id number] Yes 1 Device by Does not apply route every 14 (fourteen) days. Place 1 sensor on the skin every 14 days. Use to check glucose continuously Luetta Nutting, DO Taking Active   cycloSPORINE (RESTASIS) 0.05 % ophthalmic emulsion [id number] Yes Place 1 drop into both eyes 2 (two) times daily. [provider] Taking Active Child           Med Note Broadus John, REGEENA   Tue Mar 03, 2020  7:06 PM)    docusate sodium (COLACE) 100 MG capsule [id number] Yes Take 100 mg by mouth daily as needed for mild constipation. [provider] Taking Active   ferrous sulfate 325 (65 FE) MG tablet [id number] Yes Take 325 mg by mouth daily with breakfast. [provider] Taking Active   gabapentin (NEURONTIN) 300 MG capsule [id number]  Take 1 capsule (300 mg total) by mouth 2 (two) times daily. Luetta Nutting, DO  Active   glimepiride (AMARYL) 1 MG tablet [id number] Yes Take 1 mg by mouth every morning. [provider] Taking Active   hydrALAZINE (APRESOLINE) 10 MG tablet [id number] Yes TAKE 2 TABLETS BY MOUTH 3 TIMES DAILY. Luetta Nutting, DO Taking Active   levofloxacin (LEVAQUIN) 500 MG tablet [id number] Yes Take 1 tablet by mouth daily. Tommy [name], Lavell Islam, MD Taking Active   losartan (COZAAR)  25 MG  tablet [id number] Yes TAKE 1 TABLET (25 MG TOTAL) BY MOUTH DAILY. Matthews, Cody, DO Taking Active   metoprolol succinate (TOPROL-XL) 50 MG 24 hr tablet [id number] Yes Take 50 mg by mouth daily. [provider] Taking Active   omeprazole (PRILOSEC) 40 MG capsule [id number] Yes Take 40 mg by mouth daily. [provider] Taking Active   rosuvastatin (CRESTOR) 40 MG tablet [id number] Yes TAKE 1 TABLET BY MOUTH EVERY DAY Matthews, Cody, DO Taking Active   sitaGLIPtin (JANUVIA) 50 MG tablet [id number] Yes Take 1 tablet (50 mg total) by mouth daily. Tat, David, MD Taking Active Child  spironolactone (ALDACTONE) 25 MG tablet [id number] Yes Take 25 mg by mouth daily as needed. [provider] Taking Active             Patient Active Problem List   Diagnosis Date Noted   Shingles 06/09/2021   Itching 04/08/2021   Abdominal bloating 12/07/2020   Atrial fibrillation (HCC) 10/16/2020   QT prolongation 10/16/2020   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   Enterobacter sepsis (HCC) 04/13/2020   Thigh abscess 04/13/2020   CKD (chronic kidney disease) 04/13/2020   Bacteremia    SBO (small bowel obstruction) (HCC)    Abdominal distension    Effusion of hip joint, left    Hip pain 03/03/2020   Gout of right hand 02/23/2020   Hyperkalemia 01/14/2020   Hydroureteronephrosis 01/14/2020   Colon cancer screening 04/28/2019   Intermittent claudication (HCC) 01/22/2019   GERD (gastroesophageal reflux disease) 10/23/2018   Pseudophakia 08/11/2016   Uncontrolled type 2 diabetes mellitus with hyperglycemia, without long-term current use of insulin (HCC) 06/26/2016   Hyperlipidemia with target LDL less than 70 06/26/2016   Angina pectoris (HCC) 06/26/2016   Type 2 diabetes mellitus with hypoglycemia without coma (HCC) 06/24/2016   Essential hypertension 06/24/2016   CAD (coronary artery disease) 06/24/2016   Asthma 06/24/2016   Malignant neoplasm of prostate (HCC)  12/28/2015   OA (osteoarthritis) of hip 04/09/2012    Immunization History  Administered Date(s) Administered   Fluad Quad(high Dose 65+) 01/22/2019, 01/21/2020, 03/10/2021   Influenza, High Dose Seasonal PF 02/03/2017, 04/02/2018   Moderna SARS-COV2 Booster Vaccination 03/18/2020   Moderna Sars-Covid-2 Vaccination 06/22/2019, 03/18/2020   Pfizer Covid-19 Vaccine Bivalent Booster 5y-11y 03/10/2021   Pneumococcal Conjugate-13 04/24/2019   Pneumococcal Polysaccharide-23 10/23/2018   Tdap 04/11/2015    Conditions to be addressed/monitored: Atrial Fibrillation, HTN, HLD, and DMII  There are no care plans that you recently modified to display for this patient.       Medication Assistance: None required.  Patient affirms current coverage meets needs.  Patient's preferred pharmacy is:  [id number] - [place], [place] - [address] [place] [address] Phone: [phone] Fax: [phone]  [facility] [place] [address] [place] [address] Phone: [phone] Fax: [phone]  [facility] [address] [place] [address] Phone: [phone] Fax: [phone]   Uses pill box? No - pulls from original bottles and working well Pt endorses 90% compliance  Follow Up:  Patient agrees to Care Plan and Follow-up.  Plan: Telephone follow up appointment with care management team member scheduled for:  2 months  Keesha Kline, PharmD Clinical Pharmacist [facility] [phone]       

## 2021-09-13 ENCOUNTER — Ambulatory Visit (INDEPENDENT_AMBULATORY_CARE_PROVIDER_SITE_OTHER): Payer: PPO | Admitting: Infectious Disease

## 2021-09-13 ENCOUNTER — Other Ambulatory Visit: Payer: Self-pay

## 2021-09-13 ENCOUNTER — Other Ambulatory Visit (HOSPITAL_COMMUNITY): Payer: Self-pay

## 2021-09-13 ENCOUNTER — Encounter: Payer: Self-pay | Admitting: Infectious Disease

## 2021-09-13 VITALS — BP 160/75 | HR 61 | Temp 97.8°F | Wt 230.0 lb

## 2021-09-13 DIAGNOSIS — I129 Hypertensive chronic kidney disease with stage 1 through stage 4 chronic kidney disease, or unspecified chronic kidney disease: Secondary | ICD-10-CM | POA: Diagnosis not present

## 2021-09-13 DIAGNOSIS — I4891 Unspecified atrial fibrillation: Secondary | ICD-10-CM | POA: Diagnosis not present

## 2021-09-13 DIAGNOSIS — T8452XD Infection and inflammatory reaction due to internal left hip prosthesis, subsequent encounter: Secondary | ICD-10-CM

## 2021-09-13 DIAGNOSIS — A4159 Other Gram-negative sepsis: Secondary | ICD-10-CM

## 2021-09-13 DIAGNOSIS — R9431 Abnormal electrocardiogram [ECG] [EKG]: Secondary | ICD-10-CM | POA: Diagnosis not present

## 2021-09-13 DIAGNOSIS — Z96649 Presence of unspecified artificial hip joint: Secondary | ICD-10-CM

## 2021-09-13 DIAGNOSIS — N183 Chronic kidney disease, stage 3 unspecified: Secondary | ICD-10-CM | POA: Diagnosis not present

## 2021-09-13 DIAGNOSIS — L299 Pruritus, unspecified: Secondary | ICD-10-CM

## 2021-09-13 DIAGNOSIS — Z87891 Personal history of nicotine dependence: Secondary | ICD-10-CM | POA: Diagnosis not present

## 2021-09-13 DIAGNOSIS — I10 Essential (primary) hypertension: Secondary | ICD-10-CM

## 2021-09-13 DIAGNOSIS — B029 Zoster without complications: Secondary | ICD-10-CM

## 2021-09-13 DIAGNOSIS — T8459XD Infection and inflammatory reaction due to other internal joint prosthesis, subsequent encounter: Secondary | ICD-10-CM | POA: Diagnosis not present

## 2021-09-13 HISTORY — DX: Zoster without complications: B02.9

## 2021-09-13 MED ORDER — LEVOFLOXACIN 500 MG PO TABS
500.0000 mg | ORAL_TABLET | Freq: Every day | ORAL | 3 refills | Status: DC
Start: 2021-09-13 — End: 2021-10-26
  Filled 2021-09-13: qty 30, 30d supply, fill #0
  Filled 2021-10-11: qty 30, 30d supply, fill #1

## 2021-09-13 NOTE — Progress Notes (Signed)
Subjective:  Chief complaint: Still with itching due to the levofloxacin and also pain due to a recent zoster eruption    Patient ID: [name], male    DOB: Jan 24, 1946, 76 y.o.   MRN: [id number]  HPI  76 year old Black man with PMHx of CAD, DM2, HTN, CKD, OA, s/p left hip arthoplasty (2001), prostate cancer, PAF, left ureteral stone s/p stenting (01/2020) who was sent from ortho clinic on 11/23 by Dr Wynelle Link. He grew Enterobacter from blood, urine and hip aspirate. He is sp surgery with Irrigation and debridement, left hip, with bearing surface exchange. Enterobacter grew on cultures.  We had him scheduled to complete antibiotics on 10 January.  In the interim he developed a fluid collection in his thigh that was drained and seemed rather bloody but which also grew Enterobacter from culture.   He finished cefepime and we started bactrim DS BID he ran into problems with hyperkalemia and elevated creatinine also in the context of other potassium sparing and potassium raising medications   We obtained Nuzyra PA though due to his deductible not having yet been met he would have to pay roughly $2k upfront.   He ended up doing that but then turned out that he need to pay $640 a month even though he had met that amount.   I then took [name] off he [name] because clinically seem d to be doing well and his CRP had normalized though his sed rate had not normalized I did come down from 140 into the 90s.  I also felt like continue him on this tetracycline would just be to cost prohibitive.  Unfortunately since he came off antibiotics he experienced recurrence of infection at his hip with increasing pain and also pus which began coming through the skin.  He was seen by Dr.  [name]  who aspirated the area and sent for culture. Exact same Enterobacter grew yet again from culture in June of 2022.  After careful consideration we decided to start levofloxacin despite risk of C. difficile colitis,  Achilles tendinopathy confusion problems with blood sugar control and QT prolongation in the context of him being on amiodarone and having slight QT prolongation at baseline.  Since starting levofloxacin he to be having no adverse effects from this.  He returned to clinic and we obtained a twelve-lead EKG.  If EKG shows normal sinus rhythm with heart first-degree AV block but no significant ST or T wave changes and a QT and QTc of 429 and 451 which was largely unchanged from his last EKG obtained.  Last time I saw [name] is having some pruritus that he did to the levofloxacin.  He continues to have this but he is able to tolerate it.  He has seen Dr. Loanne Drilling for his diabetes mellitus.  I was told by triage staff that his levofloxacin had been discontinued possibly by Dr. Loanne Drilling but I do not see mention of this in his note indeed he was not off levofloxacin.  His pain has been stable and seems to have been improved.  He did have a flare of zoster recently which caused him to have pain in his left lower extremity and also up in his hip.  This is improved however.  He does still have itching that is worse when he goes to bed or when he is "winding down.  He very much would like to come off the antibiotics would like to push through 1 year from when it recurred  which would be in July.       Past Medical History:  Diagnosis Date   Arthritis    Asthma    no inhaler   Atrial fibrillation ([name]) 10/16/2020   Coronary artery disease    cardiologist-  dr spruill; last visit 3 mos ago per pt   Enterobacter sepsis ([place]) 04/13/2020   GERD (gastroesophageal reflux disease)    History of MI (myocardial infarction)    1985   Hydronephrosis, left    Hypertension    Myocardial infarction ([place])    Nephrolithiasis    left   Neuromuscular disorder ([facility])    TINGLING IN BOTH HANDS   Presence of tooth-root and mandibular implants    lower dental implants   Prostate cancer ([place])    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   QT prolongation 10/16/2020   Shortness of breath    WITH EXERTION   Thigh abscess 04/13/2020   Type 2 diabetes mellitus ([place])     Past Surgical History:  Procedure Laterality Date   BUNIONECTOMY     CYSTOSCOPY W/ RETROGRADES Left 03/14/2014   Procedure: CYSTOSCOPY WITH LEFT RETROGRADE PYELOGRAM, Left Ureteroscopy, [place] ureteral Stent No string;  Surgeon: [name], MD;  Location: [facility];  Service: Urology;  Laterality: Left;   CYSTOSCOPY W/ URETERAL STENT PLACEMENT Bilateral 01/15/2020   Procedure: CYSTOSCOPY WITH RETROGRADE PYELOGRAM/URETERAL STENT PLACEMENT;  Surgeon: Alexis Frock, MD;  Location: [facility];  Service: Urology;  Laterality: Bilateral;   CYSTOSCOPY/URETEROSCOPY/HOLMIUM LASER/STENT PLACEMENT Bilateral 03/17/2020   Procedure: CYSTOSCOPY/URETEROSCOPY/HOLMIUM LASER/STENT LEFT PLACEMENT;  Surgeon: Festus Aloe, MD;  Location: [facility];  Service: Urology;  Laterality: Bilateral;   HERNIA REPAIR     INCISION AND DRAINAGE Left 03/09/2020   Procedure: INCISION AND DRAINAGE LEFT HIP WITH LINER EXCHANGE;  Surgeon: Gaynelle Arabian, MD;  Location: [facility];  Service: Orthopedics;  Laterality: Left;   IR FLUORO GUIDE CV LINE RIGHT  03/10/2020   IR RADIOLOGIST EVAL & MGMT  04/01/2020   IR RADIOLOGIST EVAL & MGMT  04/15/2020   IR RADIOLOGIST EVAL & MGMT  04/30/2020   IR REMOVAL TUN CV CATH W/O FL  04/22/2020   IR US GUIDE BX ASP/DRAIN  03/17/2020   IR US GUIDE VASC ACCESS RIGHT  03/10/2020   PROSTATE BIOPSY     TOTAL HIP ARTHROPLASTY Left 03-20-2009   TOTAL HIP ARTHROPLASTY  04/09/2012   Procedure: TOTAL HIP ARTHROPLASTY;  Surgeon: [name], MD;  Location: [facility];  Service: Orthopedics;  Laterality: Right;   TOTAL KNEE ARTHROPLASTY Left 05-16-2008    Family History  Problem Relation Age of Onset   Cancer Mother        breast   Multiple sclerosis Daughter    Cancer Father        prostate   Diabetes Father    Cancer Maternal Uncle         bone      Social History   Socioeconomic History   Marital status: Widowed    Spouse name: Not on file   Number of children: Not on file   Years of education: Not on file   Highest education level: Not on file  Occupational History   Not on file  Tobacco Use   Smoking status: Former    Packs/day: 1.00    Years: 25.00    Pack years: 25.00    Types: Cigarettes    Quit date: 04/11/1984    Years since quitting: 44.4  Smokeless tobacco: Former    Quit date: 04/02/1985  Vaping Use   Vaping Use: Never used  Substance and Sexual Activity   Alcohol use: Not Currently    Comment: RARE   Drug use: No   Sexual activity: Not Currently  Other Topics Concern   Not on file  Social History Narrative   Not on file   Social Determinants of Health   Financial Resource Strain: Not on file  Food Insecurity: Not on file  Transportation Needs: Not on file  Physical Activity: Not on file  Stress: Not on file  Social Connections: Not on file    Allergies  Allergen Reactions   Shellfish Allergy Rash     Current Outpatient Medications:    albuterol (VENTOLIN HFA) 108 (90 Base) MCG/ACT inhaler, Inhale 2 puffs into the lungs every 6 (six) hours as needed for wheezing or shortness of breath., Disp: 8 g, Rfl: 0   allopurinol (ZYLOPRIM) 100 MG tablet, TAKE 1/2 TABLET BY MOUTH EVERY DAY (Patient not taking: Reported on 09/10/2021), Disp: 45 tablet, Rfl: 1   amiodarone (PACERONE) 200 MG tablet, Take 200 mg by mouth daily., Disp: , Rfl:    amLODipine (NORVASC) 10 MG tablet, TAKE 1 TABLET BY MOUTH EVERY DAY, Disp: 90 tablet, Rfl: 1   Aspirin 81 MG CAPS, Take 81 mg by mouth daily., Disp: , Rfl:    clotrimazole-betamethasone (LOTRISONE) cream, APPLY 1 APPLICATION TOPICALLY DAILY, Disp: 30 g, Rfl: 0   colchicine 0.6 MG tablet, TAKE 1 TABLET BY MOUTH EVERY DAY (Patient not taking: Reported on 09/10/2021), Disp: 90 tablet, Rfl: 1   colesevelam (WELCHOL) 625 MG tablet, TAKE 2 TABLETS (1,250 MG  TOTAL) BY MOUTH DAILY., Disp: 180 tablet, Rfl: 3   Continuous Blood Gluc Sensor (FREESTYLE LIBRE 3 SENSOR) MISC, 1 DEVICE BY DOES NOT APPLY ROUTE EVERY 14 (FOURTEEN) DAYS. PLACE 1 SENSOR ON THE SKIN EVERY 14 DAYS. USE TO CHECK GLUCOSE CONTINUOUSLY, Disp: 2 each, Rfl: 3   cycloSPORINE (RESTASIS) 0.05 % ophthalmic emulsion, Place 1 drop into both eyes 2 (two) times daily., Disp: , Rfl:    docusate sodium (COLACE) 100 MG capsule, Take 100 mg by mouth daily as needed for mild constipation., Disp: , Rfl:    ferrous sulfate 325 (65 FE) MG tablet, Take 325 mg by mouth daily with breakfast., Disp: , Rfl:    glimepiride (AMARYL) 1 MG tablet, Take 1 mg by mouth every morning., Disp: , Rfl:    hydrALAZINE (APRESOLINE) 10 MG tablet, TAKE 2 TABLETS BY MOUTH 3 TIMES DAILY., Disp: 540 tablet, Rfl: 0   levofloxacin (LEVAQUIN) 500 MG tablet, Take 1 tablet by mouth daily., Disp: 30 tablet, Rfl: 3   losartan (COZAAR) 25 MG tablet, TAKE 1 TABLET (25 MG TOTAL) BY MOUTH DAILY., Disp: 90 tablet, Rfl: 0   metoprolol succinate (TOPROL-XL) 50 MG 24 hr tablet, Take 50 mg by mouth daily., Disp: , Rfl:    omeprazole (PRILOSEC) 40 MG capsule, Take 40 mg by mouth daily., Disp: , Rfl:    pregabalin (LYRICA) 100 MG capsule, Take 1 capsule (100 mg total) by mouth 2 (two) times daily. (Patient not taking: Reported on 09/10/2021), Disp: 60 capsule, Rfl: 3   rosuvastatin (CRESTOR) 40 MG tablet, TAKE 1 TABLET BY MOUTH EVERY DAY, Disp: 90 tablet, Rfl: 3   sitaGLIPtin (JANUVIA) 50 MG tablet, Take 1 tablet (50 mg total) by mouth daily., Disp: 30 tablet, Rfl: 0   spironolactone (ALDACTONE) 25 MG tablet, TAKE 1 TABLET BY MOUTH  EVERY DAY AS NEEDED FOR EDEMA (Patient not taking: Reported on 09/10/2021), Disp: 90 tablet, Rfl: 1   Review of Systems  Constitutional:  Negative for activity change, appetite change, chills, diaphoresis, fatigue, fever and unexpected weight change.  HENT:  Negative for congestion, rhinorrhea, sinus pressure, sneezing,  sore throat and trouble swallowing.   Eyes:  Negative for photophobia and visual disturbance.  Respiratory:  Negative for cough, chest tightness, shortness of breath, wheezing and stridor.   Cardiovascular:  Negative for chest pain, palpitations and leg swelling.  Gastrointestinal:  Negative for abdominal distention, abdominal pain, anal bleeding, blood in stool, constipation, diarrhea, nausea and vomiting.  Genitourinary:  Negative for difficulty urinating, dysuria, flank pain and hematuria.  Musculoskeletal:  Negative for arthralgias, back pain, gait problem, joint swelling and myalgias.  Skin:  Negative for color change, pallor, rash and wound.  Neurological:  Negative for dizziness, tremors, weakness and light-headedness.  Hematological:  Negative for adenopathy. Does not bruise/bleed easily.  Psychiatric/Behavioral:  Negative for agitation, behavioral problems, confusion, decreased concentration, dysphoric mood and sleep disturbance.       Objective:   Physical Exam Constitutional:      Appearance: He is well-developed.  HENT:     Head: Normocephalic and atraumatic.  Eyes:     Conjunctiva/sclera: Conjunctivae normal.  Cardiovascular:     Rate and Rhythm: Normal rate and regular rhythm.  Pulmonary:     Effort: Pulmonary effort is normal. No respiratory distress.     Breath sounds: No wheezing.  Abdominal:     General: There is no distension.     Palpations: Abdomen is soft.  Musculoskeletal:        General: No tenderness. Normal range of motion.     Cervical back: Normal range of motion and neck supple.  Skin:    General: Skin is warm and dry.     Coloration: Skin is not pale.     Findings: No erythema or rash.  Neurological:     General: No focal deficit present.     Mental Status: He is alert and oriented to person, place, and time.  Psychiatric:        Mood and Affect: Mood normal.        Behavior: Behavior normal.        Thought Content: Thought content normal.         Judgment: Judgment normal.    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  He had recurrence when he came off of [place].  We have been now treating him with levofloxacin  We will recheck sed rate CRP today BMP CBC with differential.  We will push to 1 year of therapy and then consider trialing him off  Pruritis: He is tolerating but is this chief annoyance for him.   QT prolongation: has been prolonged but stable  CKD: Checking BMP   Atrial fibrillation on anticoagulation currently on amiodarone as well   Zoster eruption: Still recovering from pain from this.

## 2021-09-14 ENCOUNTER — Ambulatory Visit (INDEPENDENT_AMBULATORY_CARE_PROVIDER_SITE_OTHER): Payer: HMO | Admitting: Endocrinology

## 2021-09-14 ENCOUNTER — Encounter: Payer: Self-pay | Admitting: Endocrinology

## 2021-09-14 ENCOUNTER — Telehealth: Payer: Self-pay

## 2021-09-14 VITALS — BP 152/64 | HR 60 | Ht 73.0 in | Wt 229.6 lb

## 2021-09-14 DIAGNOSIS — E11649 Type 2 diabetes mellitus with hypoglycemia without coma: Secondary | ICD-10-CM | POA: Diagnosis not present

## 2021-09-14 DIAGNOSIS — E1142 Type 2 diabetes mellitus with diabetic polyneuropathy: Secondary | ICD-10-CM

## 2021-09-14 DIAGNOSIS — E782 Mixed hyperlipidemia: Secondary | ICD-10-CM

## 2021-09-14 LAB — CBC WITH DIFFERENTIAL/PLATELET
Absolute Monocytes: 515 cells/uL (ref 200–950)
Basophils Absolute: 18 cells/uL (ref 0–200)
Basophils Relative: 0.4 %
Eosinophils Absolute: 170 cells/uL (ref 15–500)
Eosinophils Relative: 3.7 %
HCT: 30.1 % — ABNORMAL LOW (ref 38.5–50.0)
Hemoglobin: 9.9 g/dL — ABNORMAL LOW (ref 13.2–17.1)
Lymphs Abs: 1099 cells/uL (ref 850–3900)
MCH: 31.3 pg (ref 27.0–33.0)
MCHC: 32.9 g/dL (ref 32.0–36.0)
MCV: 95.3 fL (ref 80.0–100.0)
MPV: 9.6 fL (ref 7.5–12.5)
Monocytes Relative: 11.2 %
Neutro Abs: 2797 cells/uL (ref 1500–7800)
Neutrophils Relative %: 60.8 %
Platelets: 293 10*3/uL (ref 140–400)
RBC: 3.16 10*6/uL — ABNORMAL LOW (ref 4.20–5.80)
RDW: 13.3 % (ref 11.0–15.0)
Total Lymphocyte: 23.9 %
WBC: 4.6 10*3/uL (ref 3.8–10.8)

## 2021-09-14 LAB — POCT GLYCOSYLATED HEMOGLOBIN (HGB A1C): Hemoglobin A1C: 6.5 % — AB (ref 4.0–5.6)

## 2021-09-14 LAB — POCT GLUCOSE (DEVICE FOR HOME USE): POC Glucose: 110 mg/dl — AB (ref 70–99)

## 2021-09-14 LAB — SEDIMENTATION RATE: Sed Rate: 55 mm/h — ABNORMAL HIGH (ref 0–20)

## 2021-09-14 LAB — BASIC METABOLIC PANEL WITH GFR
BUN/Creatinine Ratio: 19 (calc) (ref 6–22)
BUN: 34 mg/dL — ABNORMAL HIGH (ref 7–25)
CO2: 19 mmol/L — ABNORMAL LOW (ref 20–32)
Calcium: 9 mg/dL (ref 8.6–10.3)
Chloride: 111 mmol/L — ABNORMAL HIGH (ref 98–110)
Creat: 1.8 mg/dL — ABNORMAL HIGH (ref 0.70–1.28)
Glucose, Bld: 120 mg/dL — ABNORMAL HIGH (ref 65–99)
Potassium: 5 mmol/L (ref 3.5–5.3)
Sodium: 138 mmol/L (ref 135–146)
eGFR: 39 mL/min/{1.73_m2} — ABNORMAL LOW (ref >=60)

## 2021-09-14 LAB — C-REACTIVE PROTEIN: CRP: 0.6 mg/L (ref <8.0)

## 2021-09-14 MED ORDER — REPAGLINIDE 0.5 MG PO TABS: 0.5000 mg | ORAL_TABLET | Freq: Two times a day (BID) | ORAL | 2 refills | Status: DC

## 2021-09-14 NOTE — Telephone Encounter (Signed)
-----   Message from Truman Hayward, MD sent at 09/13/2021  9:14 PM EDT ----- Regarding: FW: Pts creatinine a bit worse. Should follow up w PCP and or Cardiology Re this ----- Message ----- From: Interface, [facility] Lab Results In Sent: 09/13/2021   4:45 PM EDT To: Truman Hayward, MD

## 2021-09-14 NOTE — Telephone Encounter (Signed)
Spoke with patient, relayed that kidney function is slightly worse and advised he follow up with his PCP and/or cardiology. Patient verbalized understanding and has no further questions.   Beryle Flock, RN

## 2021-09-14 NOTE — Patient Instructions (Signed)
Stop Glimeperide  Prandin generic 1 before Bfst  If sugar going over 160 after supper add 1 Prandin before supper

## 2021-09-14 NOTE — Progress Notes (Signed)
Patient ID: [name], male   DOB: 1946/03/26, 76 y.o.   MRN: [id number]           Reason for Appointment: Type II Diabetes follow-up   History of Present Illness   Diagnosis date: 2001  Previous history:  He was previously on metformin which was stopped because of renal dysfunction Amaryl was added in 2013 starting at 4 mg daily Januvia was started in 2018 His A1c has ranged between 5.9 and 9.3 but generally lower since 5/22  Recent history:     Non-insulin hypoglycemic drugs: Januvia 50 mg daily, Amaryl 1 mg daily in a.m., WelChol 1250 mg daily     Side effects from medications: None  Current self management, blood sugar patterns and problems identified:  A1c is 6.5, compared to 6.0 He is continuing to use the CGM with the [place] and currently unable to get the data downloaded from his phone app His blood sugars appear to be fairly consistently controlled trolled at all times but variably high after breakfast only He is only eating cereal in the morning such as Cheerios and also a meat sandwich at the same time Blood sugars at other meals are fairly balanced He has difficulty doing much exercise because of leg pain Also not able to lose weight, currently BMI is just over 30 He has not been sent for diabetes education About 3 weeks ago he had an episode of significant hypoglycemia when by mistake took 2 doses of Amaryl and had difficulty thinking and felt off balance for the whole day, not clear what his blood sugar was at that time  Exercise: limited  Diet management:      Monitors blood glucose: With freestyle libre 2  Data was reviewed directly from his phone app  TIME in range 96% with 0% below 70  PRE-MEAL Fasting Lunch Dinner Bedtime Overall  Glucose range:       Mean/median: 125   132 130   POST-MEAL PC Breakfast PC Lunch PC Dinner  Glucose range:     Mean/median: 160      Weight control:  Wt Readings from Last 3 Encounters:  09/14/21 229 lb 9.6  oz (104.1 kg)  09/13/21 230 lb (104.3 kg)  07/09/21 232 lb (105.2 kg)            Diabetes labs:  Lab Results  Component Value Date   HGBA1C 6.5 (A) 09/14/2021   HGBA1C 6.0 (A) 06/07/2021   HGBA1C 6.4 (A) 03/08/2021   Lab Results  Component Value Date   MICROALBUR 4.2 (H) 10/23/2018   LDLCALC 97 08/12/2020   CREATININE 1.80 (H) 09/13/2021     Allergies as of 09/14/2021       Reactions   Shellfish Allergy Rash        Medication List        Accurate as of September 14, 2021  8:46 PM. If you have any questions, ask your nurse or doctor.          STOP taking these medications    glimepiride 1 MG tablet Commonly known as: AMARYL Stopped by: Elayne Snare, MD       TAKE these medications    albuterol 108 (90 Base) MCG/ACT inhaler Commonly known as: VENTOLIN HFA Inhale 2 puffs into the lungs every 6 (six) hours as needed for wheezing or shortness of breath.   allopurinol 100 MG tablet Commonly known as: ZYLOPRIM TAKE 1/2 TABLET BY MOUTH EVERY DAY   amiodarone 200 MG tablet  Commonly known as: PACERONE Take 200 mg by mouth daily.   amLODipine 10 MG tablet Commonly known as: NORVASC TAKE 1 TABLET BY MOUTH EVERY DAY   Aspirin 81 MG Caps Take 81 mg by mouth daily.   clotrimazole-betamethasone cream Commonly known as: LOTRISONE APPLY 1 APPLICATION TOPICALLY DAILY   colchicine 0.6 MG tablet TAKE 1 TABLET BY MOUTH EVERY DAY   colesevelam 625 MG tablet Commonly known as: WELCHOL TAKE 2 TABLETS (1,250 MG TOTAL) BY MOUTH DAILY.   cycloSPORINE 0.05 % ophthalmic emulsion Commonly known as: RESTASIS Place 1 drop into both eyes 2 (two) times daily.   docusate sodium 100 MG capsule Commonly known as: COLACE Take 100 mg by mouth daily as needed for mild constipation.   ferrous sulfate 325 (65 FE) MG tablet Take 325 mg by mouth daily with breakfast.   FreeStyle Libre 3 Sensor Misc 1 DEVICE BY DOES NOT APPLY ROUTE EVERY 14 (FOURTEEN) DAYS. PLACE 1 SENSOR ON THE  SKIN EVERY 14 DAYS. USE TO CHECK GLUCOSE CONTINUOUSLY   hydrALAZINE 10 MG tablet Commonly known as: APRESOLINE TAKE 2 TABLETS BY MOUTH 3 TIMES DAILY.   levofloxacin 500 MG tablet Commonly known as: LEVAQUIN Take 1 tablet by mouth daily.   losartan 25 MG tablet Commonly known as: COZAAR losartan 25 mg tablet  TAKE 1 TABLET (25 MG TOTAL) BY MOUTH DAILY.   losartan 25 MG tablet Commonly known as: COZAAR TAKE 1 TABLET (25 MG TOTAL) BY MOUTH DAILY.   metoprolol succinate 50 MG 24 hr tablet Commonly known as: TOPROL-XL Take 50 mg by mouth daily.   omeprazole 40 MG capsule Commonly known as: PRILOSEC Take 40 mg by mouth daily.   pregabalin 100 MG capsule Commonly known as: Lyrica Take 1 capsule (100 mg total) by mouth 2 (two) times daily.   repaglinide 0.5 MG tablet Commonly known as: PRANDIN Take 1 tablet (0.5 mg total) by mouth 2 (two) times daily before a meal. Started by: Elayne Snare, MD   rosuvastatin 40 MG tablet Commonly known as: CRESTOR TAKE 1 TABLET BY MOUTH EVERY DAY   sitaGLIPtin 50 MG tablet Commonly known as: JANUVIA Take 1 tablet (50 mg total) by mouth daily.   spironolactone 25 MG tablet Commonly known as: ALDACTONE TAKE 1 TABLET BY MOUTH EVERY DAY AS NEEDED FOR EDEMA        Allergies:  Allergies  Allergen Reactions   Shellfish Allergy Rash    Past Medical History:  Diagnosis Date   Arthritis    Asthma    no inhaler   Atrial fibrillation ([place]) 10/16/2020   Coronary artery disease    cardiologist-  dr spruill; last visit 3 mos ago per pt   Enterobacter sepsis ([place]) 04/13/2020   GERD (gastroesophageal reflux disease)    History of MI (myocardial infarction)    1985   Hydronephrosis, left    Hypertension    Myocardial infarction ([place])    Nephrolithiasis    left   Neuromuscular disorder ([facility])    TINGLING IN BOTH HANDS   Presence of tooth-root and mandibular implants    lower dental implants   Prostate cancer ([place])    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   QT prolongation 10/16/2020   Shortness of breath    WITH EXERTION   Thigh abscess 04/13/2020   Type 2 diabetes mellitus ([place])    Zoster 09/13/2021    Past Surgical History:  Procedure Laterality Date   BUNIONECTOMY     CYSTOSCOPY W/ RETROGRADES  Left 03/14/2014   Procedure: CYSTOSCOPY WITH LEFT RETROGRADE PYELOGRAM, Left Ureteroscopy, [place] ureteral Stent No string;  Surgeon: [name], MD;  Location: [facility];  Service: Urology;  Laterality: Left;   CYSTOSCOPY W/ URETERAL STENT PLACEMENT Bilateral 01/15/2020   Procedure: CYSTOSCOPY WITH RETROGRADE PYELOGRAM/URETERAL STENT PLACEMENT;  Surgeon: Alexis Frock, MD;  Location: [facility];  Service: Urology;  Laterality: Bilateral;   CYSTOSCOPY/URETEROSCOPY/HOLMIUM LASER/STENT PLACEMENT Bilateral 03/17/2020   Procedure: CYSTOSCOPY/URETEROSCOPY/HOLMIUM LASER/STENT LEFT PLACEMENT;  Surgeon: Festus Aloe, MD;  Location: [facility];  Service: Urology;  Laterality: Bilateral;   HERNIA REPAIR     INCISION AND DRAINAGE Left 03/09/2020   Procedure: INCISION AND DRAINAGE LEFT HIP WITH LINER EXCHANGE;  Surgeon: Gaynelle Arabian, MD;  Location: [facility];  Service: Orthopedics;  Laterality: Left;   IR FLUORO GUIDE CV LINE RIGHT  03/10/2020   IR RADIOLOGIST EVAL & MGMT  04/01/2020   IR RADIOLOGIST EVAL & MGMT  04/15/2020   IR RADIOLOGIST EVAL & MGMT  04/30/2020   IR REMOVAL TUN CV CATH W/O FL  04/22/2020   IR US GUIDE BX ASP/DRAIN  03/17/2020   IR US GUIDE VASC ACCESS RIGHT  03/10/2020   PROSTATE BIOPSY     TOTAL HIP ARTHROPLASTY Left 03-20-2009   TOTAL HIP ARTHROPLASTY  04/09/2012   Procedure: TOTAL HIP ARTHROPLASTY;  Surgeon: [name], MD;  Location: [facility];  Service: Orthopedics;  Laterality: Right;   TOTAL KNEE ARTHROPLASTY Left 05-16-2008    Family History  Problem Relation Age of Onset   Cancer Mother        breast   Multiple sclerosis Daughter    Cancer Father        prostate   Diabetes Father     Cancer Maternal Uncle        bone    Social History:  reports that he quit smoking about 37 years ago. His smoking use included cigarettes. He has a 25.00 pack-year smoking history. He quit smokeless tobacco use about 36 years ago. He reports that he does not currently use alcohol. He reports that he does not use drugs.  Review of Systems:  Last diabetic eye exam date 09/2000  Last foot exam date: 8/22 He has symptoms of burning and pain in his feet not always relieved by gabapentin He sometimes just takes Tylenol His PCP had offered Lyrica but he did not apparently tolerated  Hypertension: His medications include 25 mg losartan  BP Readings from Last 3 Encounters:  09/14/21 (!) 152/64  09/13/21 (!) 160/75  07/09/21 (!) 153/61    Lipids: He has been managed with 40 mg rosuvastatin and WelChol No recent labs available including from cardiologist    Lab Results  Component Value Date   CHOL 159 08/12/2020   CHOL 223 (H) 05/18/2020   CHOL 154 10/01/2019   Lab Results  Component Value Date   HDL 41 08/12/2020   HDL 45 05/18/2020   HDL 34 (A) 10/01/2019   Lab Results  Component Value Date   LDLCALC 97 08/12/2020   LDLCALC 146 (H) 05/18/2020   LDLCALC 99 10/01/2019   Lab Results  Component Value Date   TRIG 110 08/12/2020   TRIG 182 (H) 05/18/2020   TRIG 116 10/01/2019   Lab Results  Component Value Date   CHOLHDL 5.0 (H) 05/18/2020   CHOLHDL 5 10/23/2018   CHOLHDL 5.2 06/25/2016   Lab Results  Component Value Date   LDLDIRECT 132 (H) 10/24/2019  Examination:   BP (!) 152/64   Pulse 60   Ht '6\' 1"'$  (1.854 m)   Wt 229 lb 9.6 oz (104.1 kg)   SpO2 98%   BMI 30.29 kg/m   Body mass index is 30.29 kg/m.   No ankle edema  ASSESSMENT/ PLAN:    Diabetes type 2 on oral agents:   Current regimen: Amaryl, Januvia and WelChol  A1c is 6.5 with recent CGM indicating an average of about 130  Blood glucose control is generally fairly good except after  breakfast because of eating large amounts of carbohydrate including cereal He is not able to exercise or lose weight Although he will benefit from as SGLT2 drug or GLP-1 drug for cardiovascular risk reduction he apparently cannot afford any brand-name medications and is still taking Januvia with samples  Discussed in detail potential for hypoglycemia with Amaryl especially with his renal insufficiency Since he had an episode with significant hypoglycemia with accidentally taking 2 tablets we will switch him to [facility] He can start with 0.5 mg before breakfast but may need higher dose Also needs to see the dietitian from better meal planning Advised him to cut back on carbohydrates at breakfast and less cereal Also blood sugars are over 160 after dinner with stopping Amaryl he can add a prandial tablet in the evening also We will discuss using SGLT2 drugs on the next visit and if needed use patient assistance program  NEUROPATHY: He can continue taking gabapentin as needed Advised him to check his feet daily  RENAL insufficiency: Unclear of etiology as no recent microalbumin available Recommended that he make a follow-up appointment with nephrologist for chronic care  LIPIDS: Needs follow-up labs, not clear if these are being checked by his cardiologist    Patient Instructions  Stop Glimeperide  Prandin generic 1 before Bfst  If sugar going over 160 after supper add 1 Prandin before supper   Elayne Snare 09/14/2021, 8:46 PM

## 2021-09-15 ENCOUNTER — Other Ambulatory Visit (HOSPITAL_COMMUNITY): Payer: Self-pay

## 2021-09-20 DIAGNOSIS — E119 Type 2 diabetes mellitus without complications: Secondary | ICD-10-CM | POA: Diagnosis not present

## 2021-09-20 DIAGNOSIS — E785 Hyperlipidemia, unspecified: Secondary | ICD-10-CM | POA: Diagnosis not present

## 2021-09-20 DIAGNOSIS — I208 Other forms of angina pectoris: Secondary | ICD-10-CM | POA: Diagnosis not present

## 2021-09-20 DIAGNOSIS — I480 Paroxysmal atrial fibrillation: Secondary | ICD-10-CM | POA: Diagnosis not present

## 2021-09-20 DIAGNOSIS — I10 Essential (primary) hypertension: Secondary | ICD-10-CM | POA: Diagnosis not present

## 2021-09-21 ENCOUNTER — Ambulatory Visit (INDEPENDENT_AMBULATORY_CARE_PROVIDER_SITE_OTHER): Payer: HMO | Admitting: Family Medicine

## 2021-09-21 ENCOUNTER — Ambulatory Visit: Payer: HMO

## 2021-09-21 DIAGNOSIS — Z0000 Encounter for general adult medical examination without abnormal findings: Secondary | ICD-10-CM | POA: Diagnosis not present

## 2021-09-21 NOTE — Progress Notes (Signed)
MEDICARE ANNUAL WELLNESS VISIT  09/21/2021  Telephone Visit Disclaimer This Medicare AWV was conducted by telephone due to national recommendations for restrictions regarding the COVID-19 Pandemic (e.g. social distancing).  I verified, using two identifiers, that I am speaking with [name] or their authorized healthcare agent. I discussed the limitations, risks, security, and privacy concerns of performing an evaluation and management service by telephone and the potential availability of an in-person appointment in the future. The patient expressed understanding and agreed to proceed.  Location of Patient: Home Location of Provider (nurse):  In the office.  Subjective:    [name] is a 76 y.o. male patient of Luetta Nutting, DO who had a Medicare Annual Wellness Visit today via telephone. [name] is Retired and lives alone. he has 4 children. he reports that he is socially active and does interact with friends/family regularly. he is minimally physically active and enjoys watching television.  Patient Care Team: Luetta Nutting, DO as PCP - General (Family Medicine) Charolette Forward, MD as Consulting Physician (Cardiology) [name], [facility] as Pharmacist (Pharmacist)     09/21/2021    1:53 PM 05/08/2021    4:03 AM 11/30/2020   11:25 AM 03/03/2020    6:09 PM 03/01/2020    8:17 AM 01/15/2020    3:03 PM 01/14/2020    8:46 AM  Advanced Directives  Does Patient Have a Medical Advance Directive? No No No No No No No  Would patient like information on creating a medical advance directive? No - Patient declined  No - Patient declined No - Patient declined  No - Patient declined No - Patient declined    Hospital Utilization Over the Past 12 Months: # of hospitalizations or ER visits: 1 # of surgeries: 0  Review of Systems    Patient reports that his overall health is unchanged compared to last year.  History obtained from chart review and the patient  Patient  Reported Readings (BP, Pulse, CBG, Weight, etc) none  Pain Assessment Pain : 0-10 Pain Score: 1  Pain Type: Chronic pain Pain Location: Leg Pain Orientation: Left, Right Pain Descriptors / Indicators: Aching Pain Onset: More than a month ago Pain Frequency: Intermittent     Current Medications & Allergies (verified) Allergies as of 09/21/2021       Reactions   Shellfish Allergy Rash        Medication List        Accurate as of September 21, 2021  1:59 PM. If you have any questions, ask your nurse or doctor.          albuterol 108 (90 Base) MCG/ACT inhaler Commonly known as: VENTOLIN HFA Inhale 2 puffs into the lungs every 6 (six) hours as needed for wheezing or shortness of breath.   allopurinol 100 MG tablet Commonly known as: ZYLOPRIM TAKE 1/2 TABLET BY MOUTH EVERY DAY   amiodarone 200 MG tablet Commonly known as: PACERONE Take 200 mg by mouth daily.   amLODipine 10 MG tablet Commonly known as: NORVASC TAKE 1 TABLET BY MOUTH EVERY DAY   Aspirin 81 MG Caps Take 81 mg by mouth daily.   clotrimazole-betamethasone cream Commonly known as: LOTRISONE APPLY 1 APPLICATION TOPICALLY DAILY   colchicine 0.6 MG tablet TAKE 1 TABLET BY MOUTH EVERY DAY   colesevelam 625 MG tablet Commonly known as: WELCHOL TAKE 2 TABLETS (1,250 MG TOTAL) BY MOUTH DAILY.   cycloSPORINE 0.05 % ophthalmic emulsion Commonly known as: RESTASIS Place 1 drop into  both eyes 2 (two) times daily.   docusate sodium 100 MG capsule Commonly known as: COLACE Take 100 mg by mouth daily as needed for mild constipation.   ferrous sulfate 325 (65 FE) MG tablet Take 325 mg by mouth daily with breakfast.   FreeStyle Libre 3 Sensor Misc 1 DEVICE BY DOES NOT APPLY ROUTE EVERY 14 (FOURTEEN) DAYS. PLACE 1 SENSOR ON THE SKIN EVERY 14 DAYS. USE TO CHECK GLUCOSE CONTINUOUSLY   hydrALAZINE 10 MG tablet Commonly known as: APRESOLINE TAKE 2 TABLETS BY MOUTH 3 TIMES DAILY.   levofloxacin 500 MG  tablet Commonly known as: LEVAQUIN Take 1 tablet by mouth daily.   losartan 25 MG tablet Commonly known as: COZAAR losartan 25 mg tablet  TAKE 1 TABLET (25 MG TOTAL) BY MOUTH DAILY.   losartan 25 MG tablet Commonly known as: COZAAR TAKE 1 TABLET (25 MG TOTAL) BY MOUTH DAILY.   metoprolol succinate 50 MG 24 hr tablet Commonly known as: TOPROL-XL Take 50 mg by mouth daily.   omeprazole 40 MG capsule Commonly known as: PRILOSEC Take 40 mg by mouth daily.   pregabalin 100 MG capsule Commonly known as: Lyrica Take 1 capsule (100 mg total) by mouth 2 (two) times daily.   repaglinide 0.5 MG tablet Commonly known as: PRANDIN Take 1 tablet (0.5 mg total) by mouth 2 (two) times daily before a meal.   rosuvastatin 40 MG tablet Commonly known as: CRESTOR TAKE 1 TABLET BY MOUTH EVERY DAY   sitaGLIPtin 50 MG tablet Commonly known as: JANUVIA Take 1 tablet (50 mg total) by mouth daily.   spironolactone 25 MG tablet Commonly known as: ALDACTONE TAKE 1 TABLET BY MOUTH EVERY DAY AS NEEDED FOR EDEMA        History (reviewed): Past Medical History:  Diagnosis Date   Arthritis    Asthma    no inhaler   Atrial fibrillation ([place]) 10/16/2020   Coronary artery disease    cardiologist-  dr spruill; last visit 3 mos ago per pt   Enterobacter sepsis ([place]) 04/13/2020   GERD (gastroesophageal reflux disease)    History of MI (myocardial infarction)    1985   Hydronephrosis, left    Hypertension    Myocardial infarction ([place])    Nephrolithiasis    left   Neuromuscular disorder (HCC)    TINGLING IN BOTH HANDS   Presence of tooth-root and mandibular implants    lower dental implants   Prostate cancer ([place])    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   QT prolongation 10/16/2020   Shortness of breath    WITH EXERTION   Thigh abscess 04/13/2020   Type 2 diabetes mellitus ([place])    Zoster 09/13/2021   Past Surgical History:  Procedure Laterality Date   BUNIONECTOMY     CYSTOSCOPY W/  RETROGRADES Left 03/14/2014   Procedure: CYSTOSCOPY WITH LEFT RETROGRADE PYELOGRAM, Left Ureteroscopy, [place] ureteral Stent No string;  Surgeon: [name], MD;  Location: [facility];  Service: Urology;  Laterality: Left;   CYSTOSCOPY W/ URETERAL STENT PLACEMENT Bilateral 01/15/2020   Procedure: CYSTOSCOPY WITH RETROGRADE PYELOGRAM/URETERAL STENT PLACEMENT;  Surgeon: Alexis Frock, MD;  Location: [facility];  Service: Urology;  Laterality: Bilateral;   CYSTOSCOPY/URETEROSCOPY/HOLMIUM LASER/STENT PLACEMENT Bilateral 03/17/2020   Procedure: CYSTOSCOPY/URETEROSCOPY/HOLMIUM LASER/STENT LEFT PLACEMENT;  Surgeon: Festus Aloe, MD;  Location: [facility];  Service: Urology;  Laterality: Bilateral;   HERNIA REPAIR     INCISION AND DRAINAGE Left 03/09/2020   Procedure: INCISION AND DRAINAGE LEFT  HIP WITH LINER EXCHANGE;  Surgeon: Gaynelle Arabian, MD;  Location: [facility];  Service: Orthopedics;  Laterality: Left;   IR FLUORO GUIDE CV LINE RIGHT  03/10/2020   IR RADIOLOGIST EVAL & MGMT  04/01/2020   IR RADIOLOGIST EVAL & MGMT  04/15/2020   IR RADIOLOGIST EVAL & MGMT  04/30/2020   IR REMOVAL TUN CV CATH W/O FL  04/22/2020   IR US GUIDE BX ASP/DRAIN  03/17/2020   IR US GUIDE VASC ACCESS RIGHT  03/10/2020   PROSTATE BIOPSY     TOTAL HIP ARTHROPLASTY Left 03-20-2009   TOTAL HIP ARTHROPLASTY  04/09/2012   Procedure: TOTAL HIP ARTHROPLASTY;  Surgeon: [name], MD;  Location: [facility];  Service: Orthopedics;  Laterality: Right;   TOTAL KNEE ARTHROPLASTY Left 05-16-2008   Family History  Problem Relation Age of Onset   Cancer Mother        breast   Multiple sclerosis Daughter    Cancer Father        prostate   Diabetes Father    Cancer Maternal Uncle        bone   Social History   Socioeconomic History   Marital status: Widowed    Spouse name: Not on file   Number of children: 4   Years of education: 9   Highest education level: 9th grade  Occupational History   Occupation: Retired   Tobacco Use   Smoking status: Former    Packs/day: 1.00    Years: 25.00    Total pack years: 25.00    Types: Cigarettes    Quit date: 04/11/1984    Years since quitting: 37.4   Smokeless tobacco: Former    Quit date: 04/02/1985  Vaping Use   Vaping Use: Never used  Substance and Sexual Activity   Alcohol use: Not Currently    Comment: RARE   Drug use: No   Sexual activity: Not Currently  Other Topics Concern   Not on file  Social History Narrative   Lives alone. He had four children and one has deceased. He enjoys watching television.   Social Determinants of Health   Financial Resource Strain: Low Risk  (09/21/2021)   Overall Financial Resource Strain (CARDIA)    Difficulty of Paying Living Expenses: Not hard at all  Food Insecurity: No Food Insecurity (09/21/2021)   Hunger Vital Sign    Worried About Running Out of Food in the Last Year: Never true    Ran Out of Food in the Last Year: Never true  Transportation Needs: No Transportation Needs (09/21/2021)   PRAPARE - Hydrologist (Medical): No    Lack of Transportation (Non-Medical): No  Physical Activity: Sufficiently Active (09/21/2021)   Exercise Vital Sign    Days of Exercise per Week: 5 days    Minutes of Exercise per Session: 40 min  Stress: No Stress Concern Present (09/21/2021)   [place]    Feeling of Stress : Not at all  Social Connections: Moderately Isolated (09/21/2021)   Social Connection and Isolation Panel [NHANES]    Frequency of Communication with Friends and Family: More than three times a week    Frequency of Social Gatherings with Friends and Family: Twice a week    Attends Religious Services: More than 4 times per year    Active Member of [employer] or Organizations: No    Attends Archivist Meetings: Never    Marital  Status: Widowed    Activities of Daily Living    09/21/2021    1:50 PM  In your  present state of health, do you have any difficulty performing the following activities:  Hearing? 0  Vision? 1  Comment at times.  Difficulty concentrating or making decisions? 1  Comment sometimes.  Walking or climbing stairs? 0  Dressing or bathing? 0  Doing errands, shopping? 0  Preparing Food and eating ? N  Using the Toilet? N  In the past six months, have you accidently leaked urine? N  Do you have problems with loss of bowel control? N  Managing your Medications? N  Managing your Finances? N  Housekeeping or managing your Housekeeping? N    Patient Education/ Literacy How often do you need to have someone help you when you read instructions, pamphlets, or other written materials from your doctor or pharmacy?: 4 - Often What is the last grade level you completed in school?: 9th  Exercise Current Exercise Habits: Structured exercise class, Type of exercise: stretching;strength training/weights, Time (Minutes): 45, Frequency (Times/Week): 3, Weekly Exercise (Minutes/Week): 135, Intensity: Moderate, Exercise limited by: None identified  Diet Patient reports consuming 2 meals a day and 1 snack(s) a day Patient reports that his primary diet is: Regular Patient reports that she does have regular access to food.   Depression Screen    09/21/2021    1:48 PM 09/13/2021    9:15 AM 03/22/2021   11:26 AM 12/07/2020   11:01 AM 10/22/2020    2:36 PM 10/16/2020    8:50 AM 05/19/2020   10:48 AM  PHQ 2/9 Scores  PHQ - 2 Score 0 0 0 0 0 0 0     Fall Risk    09/21/2021    1:48 PM 09/13/2021    9:15 AM 03/22/2021   11:26 AM 10/22/2020    2:36 PM 10/16/2020    8:50 AM  Fall Risk   Falls in the past year? 0 0 0 1 1  Number falls in past yr: 0 0  1 0  Injury with Fall? 0 0  1 0  Risk for fall due to : No Fall Risks No Fall Risks   Impaired balance/gait;Orthopedic patient;History of fall(s)  Follow up Falls evaluation completed Falls evaluation completed   Falls evaluation  completed;Education provided     Objective:  [name] seemed alert and oriented and he participated appropriately during our telephone visit.  Blood Pressure Weight BMI  BP Readings from Last 3 Encounters:  09/14/21 (!) 152/64  09/13/21 (!) 160/75  07/09/21 (!) 153/61   Wt Readings from Last 3 Encounters:  09/14/21 229 lb 9.6 oz (104.1 kg)  09/13/21 230 lb (104.3 kg)  07/09/21 232 lb (105.2 kg)   BMI Readings from Last 1 Encounters:  09/14/21 30.29 kg/m    *Unable to obtain current vital signs, weight, and BMI due to telephone visit type  Hearing/Vision  [name] did not seem to have difficulty with hearing/understanding during the telephone conversation Reports that he has had a formal eye exam by an eye care professional within the past year Reports that he has not had a formal hearing evaluation within the past year *Unable to fully assess hearing and vision during telephone visit type  Cognitive Function:    09/21/2021    1:54 PM  6CIT Screen  What Year? 0 points  What month? 0 points  What time? 0 points  Count back from 20 0 points  Months  in reverse 4 points  Repeat phrase 8 points  Total Score 12 points   (Normal:0-7, Significant for Dysfunction: >8)  Normal Cognitive Function Screening: Yes   Immunization & Health Maintenance Record Immunization History  Administered Date(s) Administered   Fluad Quad(high Dose 65+) 01/22/2019, 01/21/2020, 03/10/2021   Influenza, High Dose Seasonal PF 02/03/2017, 04/02/2018   Moderna SARS-COV2 Booster Vaccination 03/18/2020   Moderna Sars-Covid-2 Vaccination 06/22/2019, 03/18/2020   Pfizer Covid-19 Vaccine Bivalent Booster 5y-11y 03/10/2021   Pneumococcal Conjugate-13 04/24/2019   Pneumococcal Polysaccharide-23 10/23/2018   Tdap 04/11/2015    Health Maintenance  Topic Date Due   COVID-19 Vaccine (3 - Mixed Product risk series) 12/10/2021 (Originally 04/07/2021)   Zoster Vaccines- Shingrix (1 of 2) 12/10/2021  (Originally 08/05/1964)   OPHTHALMOLOGY EXAM  10/05/2021   INFLUENZA VACCINE  11/09/2021   FOOT EXAM  12/02/2021   HEMOGLOBIN A1C  03/16/2022   TETANUS/TDAP  04/10/2025   Pneumonia Vaccine 7+ Years old  Completed   Hepatitis C Screening  Completed   HPV VACCINES  Aged Out   COLONOSCOPY (Pts 45-61yr Insurance coverage will need to be confirmed)  Discontinued       Assessment  This is a routine wellness examination for [name]  Health Maintenance: Due or Overdue There are no preventive care reminders to display for this patient.  Aaron Demarkdoes not need a referral for Community Assistance: Care Management:   no Social Work:    no Prescription Assistance:  no Nutrition/Diabetes Education:  no   Plan:  Personalized Goals  Goals Addressed               This Visit's Progress     Patient Stated (pt-stated)        09/21/2021 AWV Goal: Diabetes Management  Patient will maintain an A1C level below 7.0 Patient will not develop any diabetic foot complications Patient will not experience any hypoglycemic episodes over the next 3 months Patient will notify our office of any CBG readings outside of the provider recommended range by calling [phone] will adhere to provider recommendations for diabetes management  Patient Self Management Activities take all medications as prescribed and report any negative side effects monitor and record blood sugar readings as directed adhere to a low carbohydrate diet that incorporates lean proteins, vegetables, whole grains, low glycemic fruits check feet daily noting any sores, cracks, injuries, or callous formations see PCP or podiatrist if he notices any changes in his legs, feet, or toenails Patient will visit PCP and have an A1C level checked every 3 to 6 months as directed  have a yearly eye exam to monitor for vascular changes associated with diabetes and will request that the report be sent to his pcp.   consult with his PCP regarding any changes in his health or new or worsening symptoms        Personalized Health Maintenance & Screening Recommendations  Shingrix vaccine  Lung Cancer Screening Recommended: no (Low Dose CT Chest recommended if Age 76-80years, 30 pack-year currently smoking OR have quit w/in past 15 years) Hepatitis C Screening recommended: no HIV Screening recommended: no  Advanced Directives: Written information was not prepared per patient's request.  Referrals & Orders No orders of the defined types were placed in this encounter.   Follow-up Plan Follow-up with Aaron Nutting DO as planned Schedule your shingrix vaccine at your pharmacy. Medicare wellness visit in one year. AVS printed and mailed to the patient.   I have personally  reviewed and noted the following in the patient's chart:   Medical and social history Use of alcohol, tobacco or illicit drugs  Current medications and supplements Functional ability and status Nutritional status Physical activity Advanced directives List of other physicians Hospitalizations, surgeries, and ER visits in previous 12 months Vitals Screenings to include cognitive, depression, and falls Referrals and appointments  In addition, I have reviewed and discussed with [name] certain preventive protocols, quality metrics, and best practice recommendations. A written personalized care plan for preventive services as well as general preventive health recommendations is available and can be mailed to the patient at his request.      Tinnie Gens, RN-BSN  09/21/2021

## 2021-09-21 NOTE — Patient Instructions (Addendum)
[place] Maintenance Summary and Written Plan of Care  [name] ,  Thank you for allowing me to perform your Medicare Annual Wellness Visit and for your ongoing commitment to your health.   Health Maintenance & Immunization History Health Maintenance  Topic Date Due   COVID-19 Vaccine (3 - Mixed Product risk series) 12/10/2021 (Originally 04/07/2021)   Zoster Vaccines- Shingrix (1 of 2) 12/10/2021 (Originally 08/05/1964)   OPHTHALMOLOGY EXAM  10/05/2021   INFLUENZA VACCINE  11/09/2021   FOOT EXAM  12/02/2021   HEMOGLOBIN A1C  03/16/2022   TETANUS/TDAP  04/10/2025   Pneumonia Vaccine 66+ Years old  Completed   Hepatitis C Screening  Completed   HPV VACCINES  Aged Out   COLONOSCOPY (Pts 45-72yr Insurance coverage will need to be confirmed)  Discontinued   Immunization History  Administered Date(s) Administered   Fluad Quad(high Dose 76+) 01/22/2019, 01/21/2020, 03/10/2021   Influenza, High Dose Seasonal PF 02/03/2017, 04/02/2018   Moderna SARS-COV2 Booster Vaccination 03/18/2020   Moderna Sars-Covid-2 Vaccination 06/22/2019, 03/18/2020   Pfizer Covid-19 Vaccine Bivalent Booster 5y-11y 03/10/2021   Pneumococcal Conjugate-13 04/24/2019   Pneumococcal Polysaccharide-23 10/23/2018   Tdap 04/11/2015    These are the patient goals that we discussed:  Goals Addressed               This Visit's Progress     Patient Stated (pt-stated)        09/21/2021 AWV Goal: Diabetes Management  Patient will maintain an A1C level below 7.0 Patient will not develop any diabetic foot complications Patient will not experience any hypoglycemic episodes over the next 3 months Patient will notify our office of any CBG readings outside of the provider recommended range by calling [phone] will adhere to provider recommendations for diabetes management  Patient Self Management Activities take all medications as prescribed and report any negative side  effects monitor and record blood sugar readings as directed adhere to a low carbohydrate diet that incorporates lean proteins, vegetables, whole grains, low glycemic fruits check feet daily noting any sores, cracks, injuries, or callous formations see PCP or podiatrist if he notices any changes in his legs, feet, or toenails Patient will visit PCP and have an A1C level checked every 3 to 6 months as directed  have a yearly eye exam to monitor for vascular changes associated with diabetes and will request that the report be sent to his pcp.  consult with his PCP regarding any changes in his health or new or worsening symptoms          This is a list of Health Maintenance Items that are overdue or due now: Shingrix vaccine  Orders/Referrals Placed Today: No orders of the defined types were placed in this encounter.  (Contact our referral department at [phone] you have not spoken with someone about your referral appointment within the next 5 days)    Follow-up Plan Follow-up with MLuetta Nutting DO as planned Schedule your shingrix vaccine at your pharmacy. Medicare wellness visit in one year. AVS printed and mailed to the patient.      Health Maintenance, Male Adopting a healthy lifestyle and getting preventive care are important in promoting health and wellness. Ask your health care provider about: The right schedule for you to have regular tests and exams. Things you can do on your own to prevent diseases and keep yourself healthy. What should I know about diet, weight, and exercise? Eat a healthy diet  Eat a diet  that includes plenty of vegetables, fruits, low-fat dairy products, and lean protein. Do not eat a lot of foods that are high in solid fats, added sugars, or sodium. Maintain a healthy weight Body mass index (BMI) is a measurement that can be used to identify possible weight problems. It estimates body fat based on height and weight. Your health care  provider can help determine your BMI and help you achieve or maintain a healthy weight. Get regular exercise Get regular exercise. This is one of the most important things you can do for your health. Most adults should: Exercise for at least 150 minutes each week. The exercise should increase your heart rate and make you sweat (moderate-intensity exercise). Do strengthening exercises at least twice a week. This is in addition to the moderate-intensity exercise. Spend less time sitting. Even light physical activity can be beneficial. Watch cholesterol and blood lipids Have your blood tested for lipids and cholesterol at 76 years of age, then have this test every 5 years. You may need to have your cholesterol levels checked more often if: Your lipid or cholesterol levels are high. You are older than 76 years of age. You are at high risk for heart disease. What should I know about cancer screening? Many types of cancers can be detected early and may often be prevented. Depending on your health history and family history, you may need to have cancer screening at various ages. This may include screening for: Colorectal cancer. Prostate cancer. Skin cancer. Lung cancer. What should I know about heart disease, diabetes, and high blood pressure? Blood pressure and heart disease High blood pressure causes heart disease and increases the risk of stroke. This is more likely to develop in people who have high blood pressure readings or are overweight. Talk with your health care provider about your target blood pressure readings. Have your blood pressure checked: Every 3-5 years if you are 24-74 years of age. Every year if you are 36 years old or older. If you are between the ages of 27 and 21 and are a current or former smoker, ask your health care provider if you should have a one-time screening for abdominal aortic aneurysm (AAA). Diabetes Have regular diabetes screenings. This checks your fasting  blood sugar level. Have the screening done: Once every three years after age 71 if you are at a normal weight and have a low risk for diabetes. More often and at a younger age if you are overweight or have a high risk for diabetes. What should I know about preventing infection? Hepatitis B If you have a higher risk for hepatitis B, you should be screened for this virus. Talk with your health care provider to find out if you are at risk for hepatitis B infection. Hepatitis C Blood testing is recommended for: Everyone born from [birth year] through 1965. Anyone with known risk factors for hepatitis C. Sexually transmitted infections (STIs) You should be screened each year for STIs, including gonorrhea and chlamydia, if: You are sexually active and are younger than 76 years of age. You are older than 76 years of age and your health care provider tells you that you are at risk for this type of infection. Your sexual activity has changed since you were last screened, and you are at increased risk for chlamydia or gonorrhea. Ask your health care provider if you are at risk. Ask your health care provider about whether you are at high risk for HIV. Your health care provider may recommend  a prescription medicine to help prevent HIV infection. If you choose to take medicine to prevent HIV, you should first get tested for HIV. You should then be tested every 3 months for as long as you are taking the medicine. Follow these instructions at home: Alcohol use Do not drink alcohol if your health care provider tells you not to drink. If you drink alcohol: Limit how much you have to 0-2 drinks a day. Know how much alcohol is in your drink. In the [place] one drink equals one 12 oz bottle of beer (355 mL), one 5 oz glass of wine (148 mL), or one 1 oz glass of hard liquor (44 mL). Lifestyle Do not use any products that contain nicotine or tobacco. These products include cigarettes, chewing tobacco, and vaping devices,  such as e-cigarettes. If you need help quitting, ask your health care provider. Do not use street drugs. Do not share needles. Ask your health care provider for help if you need support or information about quitting drugs. General instructions Schedule regular health, dental, and eye exams. Stay current with your vaccines. Tell your health care provider if: You often feel depressed. You have ever been abused or do not feel safe at home. Summary Adopting a healthy lifestyle and getting preventive care are important in promoting health and wellness. Follow your health care provider's instructions about healthy diet, exercising, and getting tested or screened for diseases. Follow your health care provider's instructions on monitoring your cholesterol and blood pressure. This information is not intended to replace advice given to you by your health care provider. Make sure you discuss any questions you have with your health care provider. Document Revised: 08/17/2020 Document Reviewed: 08/17/2020 Elsevier Patient Education  [place].

## 2021-09-27 ENCOUNTER — Ambulatory Visit: Payer: HMO

## 2021-09-27 ENCOUNTER — Ambulatory Visit: Payer: HMO | Admitting: Infectious Disease

## 2021-09-29 ENCOUNTER — Encounter: Payer: Self-pay | Admitting: Infectious Diseases

## 2021-10-04 ENCOUNTER — Other Ambulatory Visit: Payer: Self-pay | Admitting: Family Medicine

## 2021-10-05 DIAGNOSIS — E119 Type 2 diabetes mellitus without complications: Secondary | ICD-10-CM | POA: Diagnosis not present

## 2021-10-05 DIAGNOSIS — H04123 Dry eye syndrome of bilateral lacrimal glands: Secondary | ICD-10-CM | POA: Diagnosis not present

## 2021-10-05 DIAGNOSIS — H524 Presbyopia: Secondary | ICD-10-CM | POA: Diagnosis not present

## 2021-10-05 DIAGNOSIS — H52203 Unspecified astigmatism, bilateral: Secondary | ICD-10-CM | POA: Diagnosis not present

## 2021-10-05 DIAGNOSIS — Z961 Presence of intraocular lens: Secondary | ICD-10-CM | POA: Diagnosis not present

## 2021-10-05 LAB — HM DIABETES EYE EXAM

## 2021-10-08 DIAGNOSIS — I10 Essential (primary) hypertension: Secondary | ICD-10-CM

## 2021-10-08 DIAGNOSIS — E785 Hyperlipidemia, unspecified: Secondary | ICD-10-CM

## 2021-10-08 DIAGNOSIS — I4891 Unspecified atrial fibrillation: Secondary | ICD-10-CM

## 2021-10-08 DIAGNOSIS — E11649 Type 2 diabetes mellitus with hypoglycemia without coma: Secondary | ICD-10-CM

## 2021-10-11 ENCOUNTER — Other Ambulatory Visit (HOSPITAL_COMMUNITY): Payer: Self-pay

## 2021-10-18 ENCOUNTER — Encounter: Payer: Self-pay | Admitting: Endocrinology

## 2021-10-26 ENCOUNTER — Other Ambulatory Visit (HOSPITAL_COMMUNITY): Payer: Self-pay

## 2021-10-26 ENCOUNTER — Other Ambulatory Visit: Payer: Self-pay

## 2021-10-26 ENCOUNTER — Ambulatory Visit (INDEPENDENT_AMBULATORY_CARE_PROVIDER_SITE_OTHER): Payer: PPO | Admitting: Infectious Disease

## 2021-10-26 VITALS — BP 127/74 | HR 55 | Resp 16 | Ht 73.0 in | Wt 227.0 lb

## 2021-10-26 DIAGNOSIS — I4891 Unspecified atrial fibrillation: Secondary | ICD-10-CM

## 2021-10-26 DIAGNOSIS — T8459XD Infection and inflammatory reaction due to other internal joint prosthesis, subsequent encounter: Secondary | ICD-10-CM | POA: Diagnosis not present

## 2021-10-26 DIAGNOSIS — Z96649 Presence of unspecified artificial hip joint: Secondary | ICD-10-CM | POA: Diagnosis not present

## 2021-10-26 DIAGNOSIS — L299 Pruritus, unspecified: Secondary | ICD-10-CM

## 2021-10-26 DIAGNOSIS — T8452XD Infection and inflammatory reaction due to internal left hip prosthesis, subsequent encounter: Secondary | ICD-10-CM

## 2021-10-26 DIAGNOSIS — N183 Chronic kidney disease, stage 3 unspecified: Secondary | ICD-10-CM | POA: Diagnosis not present

## 2021-10-26 MED ORDER — LEVOFLOXACIN 500 MG PO TABS
500.0000 mg | ORAL_TABLET | Freq: Every day | ORAL | 11 refills | Status: DC
  Filled 2021-10-26: qty 30, 30d supply, fill #0

## 2021-10-26 MED ORDER — LEVOFLOXACIN 500 MG PO TABS: 500.0000 mg | ORAL_TABLET | Freq: Every day | ORAL | 11 refills | Status: DC

## 2021-10-26 NOTE — Progress Notes (Signed)
Subjective:  Chief complaint:    Patient ID: [name], male    DOB: [birth year], 76 y.o.   MRN: [id number]  HPI  76 year old Black man with PMHx of CAD, DM2, HTN, CKD, OA, s/p left hip [name] (2001), prostate cancer, PAF, left ureteral stone s/p stenting (01/2020) who was sent from ortho clinic on 11/23 by [name]. He grew Enterobacter from blood, urine and hip aspirate. He is sp surgery with Irrigation and debridement, left hip, with bearing surface exchange. Enterobacter grew on cultures.  We had him scheduled to complete antibiotics on 10 January.  In the interim he developed a fluid collection in his thigh that was drained and seemed rather bloody but which also grew Enterobacter from culture.   He finished cefepime and we started bactrim DS BID he ran into problems with hyperkalemia and elevated creatinine also in the context of other potassium sparing and potassium raising medications   We obtained Nuzyra PA though due to his deductible not having yet been met he would have to pay roughly $2k upfront.   He ended up doing that but then turned out that he need to pay $640 a month even though he had met that amount.   I then took [name] off he [name] because clinically seem d to be doing well and his CRP had normalized though his sed rate had not normalized I did come down from 140 into the 90s.  I also felt like continue him on this tetracycline would just be to cost prohibitive.  Unfortunately since he came off antibiotics he experienced recurrence of infection at his hip with increasing pain and also pus which began coming through the skin.  He was seen by Aaron.  Maureen [name]  who aspirated the area and sent for culture. Exact same Enterobacter grew yet again from culture in June of 2022.  After careful consideration we decided to start levofloxacin despite risk of C. difficile colitis, Achilles tendinopathy confusion problems with blood sugar control and QT prolongation in  the context of him being on amiodarone and having slight QT prolongation at baseline.  Since starting levofloxacin he to be having no adverse effects from this.  He returned to clinic and we obtained a twelve-lead EKG.  If EKG shows normal sinus rhythm with heart first-degree AV block but no significant ST or T wave changes and a QT and QTc of 429 and 451 which was largely unchanged from his last EKG obtained.  Last time I saw [name] is having some pruritus that he did to the levofloxacin.  He continues to have this but he is able to tolerate it.  He has seen [name] for his diabetes mellitus.  I was told by triage staff that his levofloxacin had been discontinued possibly by [name] but I do not see mention of this in his note indeed he was not off levofloxacin.  His pain has been stable and seems to have been improved.  He did have a flare of zoster recently which caused him to have pain in his left lower extremity and also up in his hip.  This is improved however.  He does still have itching that is worse when he goes to bed or when he is "winding down.  He previously very much would like to come off the antibiotics would like to push through 1 year from when it recurred which would be in July.  Returns to clinic now in July a year later.  We had further discussions about continuing antibiotics versus stopping them I think in my opinion it be prudent to continue him on them for now and revisit this in 6 months because I have a high suspicion he will have recurrence once he comes off.       Past Medical History:  Diagnosis Date   Arthritis    Asthma    no inhaler   Atrial fibrillation ([place]) 10/16/2020   Coronary artery disease    cardiologist-  [name]; last visit 3 mos ago per pt   Enterobacter sepsis ([place]) 04/13/2020   GERD (gastroesophageal reflux disease)    History of MI (myocardial infarction)    1985   Hydronephrosis, left    Hypertension    Myocardial  infarction ([place])    Nephrolithiasis    left   Neuromuscular disorder ([facility])    TINGLING IN BOTH HANDS   Presence of tooth-root and mandibular implants    lower dental implants   Prostate cancer ([place])    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   QT prolongation 10/16/2020   Shortness of breath    WITH EXERTION   Thigh abscess 04/13/2020   Type 2 diabetes mellitus ([place])    Zoster 09/13/2021    Past Surgical History:  Procedure Laterality Date   BUNIONECTOMY     CYSTOSCOPY W/ RETROGRADES Left 03/14/2014   Procedure: CYSTOSCOPY WITH LEFT RETROGRADE PYELOGRAM, Left Ureteroscopy, [place] ureteral Stent No string;  Surgeon: Aaron Persons, MD;  Location: [facility];  Service: Urology;  Laterality: Left;   CYSTOSCOPY W/ URETERAL STENT PLACEMENT Bilateral 01/15/2020   Procedure: CYSTOSCOPY WITH RETROGRADE PYELOGRAM/URETERAL STENT PLACEMENT;  Surgeon: Aaron Frock, MD;  Location: [facility];  Service: Urology;  Laterality: Bilateral;   CYSTOSCOPY/URETEROSCOPY/HOLMIUM LASER/STENT PLACEMENT Bilateral 03/17/2020   Procedure: CYSTOSCOPY/URETEROSCOPY/HOLMIUM LASER/STENT LEFT PLACEMENT;  Surgeon: Aaron Aloe, MD;  Location: [facility];  Service: Urology;  Laterality: Bilateral;   HERNIA REPAIR     INCISION AND DRAINAGE Left 03/09/2020   Procedure: INCISION AND DRAINAGE LEFT HIP WITH LINER EXCHANGE;  Surgeon: Aaron Arabian, MD;  Location: [facility];  Service: Orthopedics;  Laterality: Left;   IR FLUORO GUIDE CV LINE RIGHT  03/10/2020   IR RADIOLOGIST EVAL & MGMT  04/01/2020   IR RADIOLOGIST EVAL & MGMT  04/15/2020   IR RADIOLOGIST EVAL & MGMT  04/30/2020   IR REMOVAL TUN CV CATH W/O FL  04/22/2020   IR US GUIDE BX ASP/DRAIN  03/17/2020   IR US GUIDE VASC ACCESS RIGHT  03/10/2020   PROSTATE BIOPSY     TOTAL HIP ARTHROPLASTY Left 03-20-2009   TOTAL HIP ARTHROPLASTY  04/09/2012   Procedure: TOTAL HIP ARTHROPLASTY;  Surgeon: Aaron Alf, MD;  Location: [facility];  Service: Orthopedics;  Laterality:  Right;   TOTAL KNEE ARTHROPLASTY Left 05-16-2008    Family History  Problem Relation Age of Onset   Cancer Mother        breast   Multiple sclerosis Daughter    Cancer Father        prostate   Diabetes Father    Cancer Maternal Uncle        bone      Social History   Socioeconomic History   Marital status: Widowed    Spouse name: Not on file   Number of children: 4   Years of education: 9   Highest education level: 9th grade  Occupational History   Occupation: Retired  Tobacco Use   Smoking  status: Former    Packs/day: 1.00    Years: 25.00    Total pack years: 25.00    Types: Cigarettes    Quit date: 04/11/1984    Years since quitting: 37.5   Smokeless tobacco: Former    Quit date: 04/02/1985  Vaping Use   Vaping Use: Never used  Substance and Sexual Activity   Alcohol use: Not Currently    Comment: RARE   Drug use: No   Sexual activity: Not Currently  Other Topics Concern   Not on file  Social History Narrative   Lives alone. He had four children and one has deceased. He enjoys watching television.   Social Determinants of Health   Financial Resource Strain: Low Risk  (09/21/2021)   Overall Financial Resource Strain (CARDIA)    Difficulty of Paying Living Expenses: Not hard at all  Food Insecurity: No Food Insecurity (09/21/2021)   Hunger Vital Sign    Worried About Running Out of Food in the Last Year: Never true    Ran Out of Food in the Last Year: Never true  Transportation Needs: No Transportation Needs (09/21/2021)   PRAPARE - Hydrologist (Medical): No    Lack of Transportation (Non-Medical): No  Physical Activity: Sufficiently Active (09/21/2021)   Exercise Vital Sign    Days of Exercise per Week: 5 days    Minutes of Exercise per Session: 40 min  Stress: No Stress Concern Present (09/21/2021)   [place]    Feeling of Stress : Not at all  Social  Connections: Moderately Isolated (09/21/2021)   Social Connection and Isolation Panel [NHANES]    Frequency of Communication with Friends and Family: More than three times a week    Frequency of Social Gatherings with Friends and Family: Twice a week    Attends Religious Services: More than 4 times per year    Active Member of [employer] or Organizations: No    Attends Archivist Meetings: Never    Marital Status: Widowed    Allergies  Allergen Reactions   [place]fish Allergy Rash     Current Outpatient Medications:    allopurinol (ZYLOPRIM) 100 MG tablet, TAKE 1/2 TABLET BY MOUTH EVERY DAY, Disp: 45 tablet, Rfl: 1   amoxicillin (AMOXIL) 500 MG capsule, TAKE 1 CAPSULE TWICE A DAY UNTIL FINISHED, Disp: , Rfl:    aspirin 325 MG tablet, Take by mouth., Disp: , Rfl:    atorvastatin (LIPITOR) 40 MG tablet, TAKE 1 TABLET BY MOUTH EVERY DAY FOR HYPERLIPIDEMIA, Disp: , Rfl:    clotrimazole-betamethasone (LOTRISONE) cream, APPLY 1 APPLICATION TOPICALLY DAILY, Disp: 30 g, Rfl: 0   co-enzyme Q-10 30 MG capsule, Take by mouth., Disp: , Rfl:    colchicine 0.6 MG tablet, TAKE 1 TABLET BY MOUTH EVERY DAY, Disp: 90 tablet, Rfl: 1   colesevelam (WELCHOL) 625 MG tablet, TAKE 2 TABLETS (1,250 MG TOTAL) BY MOUTH DAILY., Disp: 180 tablet, Rfl: 3   Continuous Blood Gluc Sensor (FREESTYLE LIBRE 3 SENSOR) MISC, 1 DEVICE BY DOES NOT APPLY ROUTE EVERY 14 (FOURTEEN) DAYS. PLACE 1 SENSOR ON THE SKIN EVERY 14 DAYS. USE TO CHECK GLUCOSE CONTINUOUSLY, Disp: 2 each, Rfl: 3   cycloSPORINE (RESTASIS) 0.05 % ophthalmic emulsion, Place 1 drop into both eyes 2 (two) times daily., Disp: , Rfl:    docusate sodium (COLACE) 100 MG capsule, Take 100 mg by mouth daily as needed for mild constipation., Disp: , Rfl:  ferrous sulfate 325 (65 FE) MG tablet, Take 325 mg by mouth daily with breakfast., Disp: , Rfl:    gabapentin (NEURONTIN) 300 MG capsule, Take 1 capsule by mouth 2 (two) times daily., Disp: , Rfl:    glimepiride  (AMARYL) 4 MG tablet, TAKE 1 TABLET BY MOUTH EVERY DAY FOR DIABETES, Disp: , Rfl:    hydrALAZINE (APRESOLINE) 25 MG tablet, Take 1 tablet by mouth 3 (three) times daily., Disp: , Rfl:    hydrochlorothiazide (HYDRODIURIL) 25 MG tablet, , Disp: , Rfl:    hydrOXYzine (VISTARIL) 25 MG capsule, TAKE 1 CAPSULE BY MOUTH 3 TIMES DAILY AS NEEDED FOR ITCHING., Disp: , Rfl:    losartan (COZAAR) 25 MG tablet, TAKE 1 TABLET (25 MG TOTAL) BY MOUTH DAILY., Disp: 90 tablet, Rfl: 0   methocarbamol (ROBAXIN) 500 MG tablet, Take one tablet by mouth every eight hours as needed for muscle tightness/spasms., Disp: , Rfl:    metoprolol succinate (TOPROL-XL) 50 MG 24 hr tablet, Take 50 mg by mouth daily., Disp: , Rfl:    omeprazole (PRILOSEC) 40 MG capsule, Take 40 mg by mouth daily., Disp: , Rfl:    sitaGLIPtin (JANUVIA) 50 MG tablet, Take 1 tablet (50 mg total) by mouth daily., Disp: 30 tablet, Rfl: 0   spironolactone (ALDACTONE) 25 MG tablet, TAKE 1 TABLET BY MOUTH EVERY DAY AS NEEDED FOR EDEMA, Disp: 90 tablet, Rfl: 1   torsemide (DEMADEX) 20 MG tablet, , Disp: , Rfl:    albuterol (VENTOLIN HFA) 108 (90 Base) MCG/ACT inhaler, Inhale 2 puffs into the lungs every 6 (six) hours as needed for wheezing or shortness of breath., Disp: 8 g, Rfl: 0   potassium chloride (KLOR-CON M10) 10 MEQ tablet, , Disp: , Rfl:    predniSONE (DELTASONE) 50 MG tablet, , Disp: , Rfl:    Review of Systems  Constitutional:  Negative for activity change, appetite change, chills, diaphoresis, fatigue, fever and unexpected weight change.  HENT:  Negative for congestion, rhinorrhea, sinus pressure, sneezing, sore throat and trouble swallowing.   Eyes:  Negative for photophobia and visual disturbance.  Respiratory:  Negative for cough, chest tightness, shortness of breath, wheezing and stridor.   Cardiovascular:  Negative for chest pain, palpitations and leg swelling.  Gastrointestinal:  Negative for abdominal distention, abdominal pain, anal  bleeding, blood in stool, constipation, diarrhea, nausea and vomiting.  Genitourinary:  Negative for difficulty urinating, dysuria, flank pain and hematuria.  Musculoskeletal:  Negative for arthralgias, back pain, gait problem, joint swelling and myalgias.  Skin:  Negative for color change, pallor, rash and wound.  Neurological:  Negative for dizziness, tremors, weakness and light-headedness.  Hematological:  Negative for adenopathy. Does not bruise/bleed easily.  Psychiatric/Behavioral:  Negative for agitation, behavioral problems, confusion, decreased concentration, dysphoric mood and sleep disturbance.        Objective:   Physical Exam Constitutional:      Appearance: He is well-developed.  HENT:     Head: Normocephalic and atraumatic.  Eyes:     Conjunctiva/sclera: Conjunctivae normal.  Cardiovascular:     Rate and Rhythm: Normal rate and regular rhythm.  Pulmonary:     Effort: Pulmonary effort is normal. No respiratory distress.     Breath sounds: No wheezing.  Abdominal:     General: There is no distension.     Palpations: Abdomen is soft.  Musculoskeletal:        General: No tenderness. Normal range of motion.     Cervical back: Normal range of motion  and neck supple.  Skin:    General: Skin is warm and dry.     Coloration: Skin is not pale.     Findings: No erythema or rash.  Neurological:     General: No focal deficit present.     Mental Status: He is alert and oriented to person, place, and time.  Psychiatric:        Mood and Affect: Mood normal.        Behavior: Behavior normal.        Thought Content: Thought content normal.        Judgment: Judgment normal.            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  Had recurrence after coming off of Nuzyra now on levofloxacin for more than a year.  We will recheck sed rate CRP BMP and CBC with  differential.  We will push for levofloxacin I have sent a new prescription for this for 6 more months and revisit whether or not to trial him off of it     Pruritis: Improved   QT prolongation: has been prolonged but stable  Chronic kidney disease checking BMP  Atrial fibrillation on anticoagulation and amiodarone   I spent 40 minutes with the patient including than 50% of the time in face to face counseling of the patient regarding pros and cons of continuing antibiotics in the setting of prosthetic joint infection with multiple recurrences,  along with review of medical records in preparation for the visit and during the visit and in coordination of his care.

## 2021-10-27 LAB — CBC WITH DIFFERENTIAL/PLATELET
Absolute Monocytes: 546 cells/uL (ref 200–950)
Basophils Absolute: 21 cells/uL (ref 0–200)
Basophils Relative: 0.5 %
Eosinophils Absolute: 130 cells/uL (ref 15–500)
Eosinophils Relative: 3.1 %
HCT: 29 % — ABNORMAL LOW (ref 38.5–50.0)
Hemoglobin: 9.6 g/dL — ABNORMAL LOW (ref 13.2–17.1)
Lymphs Abs: 1407 cells/uL (ref 850–3900)
MCH: 32.1 pg (ref 27.0–33.0)
MCHC: 33.1 g/dL (ref 32.0–36.0)
MCV: 97 fL (ref 80.0–100.0)
MPV: 9.3 fL (ref 7.5–12.5)
Monocytes Relative: 13 %
Neutro Abs: 2096 cells/uL (ref 1500–7800)
Neutrophils Relative %: 49.9 %
Platelets: 287 10*3/uL (ref 140–400)
RBC: 2.99 10*6/uL — ABNORMAL LOW (ref 4.20–5.80)
RDW: 13.4 % (ref 11.0–15.0)
Total Lymphocyte: 33.5 %
WBC: 4.2 10*3/uL (ref 3.8–10.8)

## 2021-10-27 LAB — BASIC METABOLIC PANEL WITH GFR
BUN/Creatinine Ratio: 15 (calc) (ref 6–22)
BUN: 26 mg/dL — ABNORMAL HIGH (ref 7–25)
CO2: 22 mmol/L (ref 20–32)
Calcium: 8.9 mg/dL (ref 8.6–10.3)
Chloride: 112 mmol/L — ABNORMAL HIGH (ref 98–110)
Creat: 1.7 mg/dL — ABNORMAL HIGH (ref 0.70–1.28)
Glucose, Bld: 79 mg/dL (ref 65–99)
Potassium: 5.6 mmol/L — ABNORMAL HIGH (ref 3.5–5.3)
Sodium: 138 mmol/L (ref 135–146)
eGFR: 41 mL/min/{1.73_m2} — ABNORMAL LOW (ref >=60)

## 2021-10-27 LAB — C-REACTIVE PROTEIN: CRP: 0.2 mg/L (ref <8.0)

## 2021-10-27 LAB — SEDIMENTATION RATE: Sed Rate: 33 mm/h — ABNORMAL HIGH (ref 0–20)

## 2021-11-03 ENCOUNTER — Encounter: Payer: HMO | Admitting: Infectious Diseases

## 2021-11-08 ENCOUNTER — Encounter: Payer: Self-pay | Admitting: Family Medicine

## 2021-11-08 ENCOUNTER — Ambulatory Visit (INDEPENDENT_AMBULATORY_CARE_PROVIDER_SITE_OTHER): Payer: HMO | Admitting: Family Medicine

## 2021-11-08 DIAGNOSIS — I739 Peripheral vascular disease, unspecified: Secondary | ICD-10-CM

## 2021-11-08 DIAGNOSIS — E11649 Type 2 diabetes mellitus with hypoglycemia without coma: Secondary | ICD-10-CM | POA: Diagnosis not present

## 2021-11-08 DIAGNOSIS — T8459XD Infection and inflammatory reaction due to other internal joint prosthesis, subsequent encounter: Secondary | ICD-10-CM

## 2021-11-08 DIAGNOSIS — I10 Essential (primary) hypertension: Secondary | ICD-10-CM | POA: Diagnosis not present

## 2021-11-08 DIAGNOSIS — Z96649 Presence of unspecified artificial hip joint: Secondary | ICD-10-CM

## 2021-11-08 MED ORDER — TRAMADOL HCL 50 MG PO TABS: 50.0000 mg | ORAL_TABLET | Freq: Three times a day (TID) | ORAL | 0 refills | Status: AC | PRN

## 2021-11-08 NOTE — Assessment & Plan Note (Signed)
ABIs without significant change from 2020.  He does have some mild PAD.  I think most of his pain is neuropathic in nature.  Likely related to lumbar spine disease.  He is on gabapentin but this does not seem to be helping a lot.  Adding tramadol as needed.  Alert for QT prolongation given prior to the sending prescription.  I reviewed this medication and effect on QT appears minimal at normal dosing and strength.

## 2021-11-08 NOTE — Assessment & Plan Note (Signed)
Continues to see infectious disease.  Remains on Levaquin long-term.  Stable symptoms at this time.

## 2021-11-08 NOTE — Progress Notes (Signed)
[name] - 76 y.o. male MRN [id number]  Date of birth: Dec 02, 1945  Subjective Chief Complaint  Patient presents with   Hypertension    HPI [name] is a 76 y.o. here today for follow up.   BP has been well controlled at home.  Tolerating medications well at current strength.  No side effects at this time.  He continues to see cardiology (Dr. [name]) as well due to history of A. Fib.  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  Had recurrence after coming off of Nuzyra.  Now treated with levaquin.  Plan is to keep him on this for at least 6 months  He is seeing endocrine for management of diabetes.  Last a1c 6.5%.  Doing well with current medications and denies hypoglycemia.    He continues to have leg pain.  ABI's without significant change from 2020.  Describes burning and aching pain.  Tried gabapentin and lyrica previously but this wasn't very helpful.    ROS:  A comprehensive ROS was completed and negative except as noted per HPI  Allergies  Allergen Reactions   Shellfish Allergy Rash    Past Medical History:  Diagnosis Date   Arthritis    Asthma    no inhaler   Atrial fibrillation ([place]) 10/16/2020   Coronary artery disease    cardiologist-  dr spruill; last visit 3 mos ago per pt   Enterobacter sepsis ([place]) 04/13/2020   GERD (gastroesophageal reflux disease)    History of MI (myocardial infarction)    1985   Hydronephrosis, left    Hypertension    Myocardial infarction ([place])    Nephrolithiasis    left   Neuromuscular disorder (HCC)    TINGLING IN BOTH HANDS   Presence of tooth-root and mandibular implants    lower dental implants   Prostate cancer ([place])    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   QT prolongation 10/16/2020   Shortness of breath    WITH EXERTION   Thigh abscess 04/13/2020   Type 2 diabetes mellitus ([employer])    Zoster 09/13/2021    Past Surgical History:  Procedure Laterality Date   BUNIONECTOMY      CYSTOSCOPY W/ RETROGRADES Left 03/14/2014   Procedure: CYSTOSCOPY WITH LEFT RETROGRADE PYELOGRAM, Left Ureteroscopy, [place] ureteral Stent No string;  Surgeon: [name], MD;  Location: [facility];  Service: Urology;  Laterality: Left;   CYSTOSCOPY W/ URETERAL STENT PLACEMENT Bilateral 01/15/2020   Procedure: CYSTOSCOPY WITH RETROGRADE PYELOGRAM/URETERAL STENT PLACEMENT;  Surgeon: Alexis Frock, MD;  Location[facility];  Service: Urology;  Laterality: Bilateral;   CYSTOSCOPY/URETEROSCOPY/HOLMIUM LASER/STENT PLACEMENT Bilateral 03/17/2020   Procedure: CYSTOSCOPY/URETEROSCOPY/HOLMIUM LASER/STENT LEFT PLACEMENT;  Surgeon: Festus Aloe, MD;  Location[facility];  Service: Urology;  Laterality: Bilateral;   HERNIA REPAIR     INCISION AND DRAINAGE Left 03/09/2020   Procedure: INCISION AND DRAINAGE LEFT HIP WITH LINER EXCHANGE;  Surgeon: Gaynelle Arabian, MD;  Location[facility];  Service: Orthopedics;  Laterality: Left;   IR FLUORO GUIDE CV LINE RIGHT  03/10/2020   IR RADIOLOGIST EVAL & MGMT  04/01/2020   IR RADIOLOGIST EVAL & MGMT  04/15/2020   IR RADIOLOGIST EVAL & MGMT  04/30/2020   IR REMOVAL TUN CV CATH W/O FL  04/22/2020   IR US GUIDE BX ASP/DRAIN  03/17/2020   IR US GUIDE VASC ACCESS RIGHT  03/10/2020   PROSTATE BIOPSY     TOTAL HIP  ARTHROPLASTY Left 03-20-2009   TOTAL HIP ARTHROPLASTY  04/09/2012   Procedure: TOTAL HIP ARTHROPLASTY;  Surgeon: [name], MD;  Location[facility];  Service: Orthopedics;  Laterality: Right;   TOTAL KNEE ARTHROPLASTY Left 05-16-2008    Social History   Socioeconomic History   Marital status: Widowed    Spouse name: Not on file   Number of children: 4   Years of education: 9   Highest education level: 9th grade  Occupational History   Occupation: Retired  Tobacco Use   Smoking status: Former    Packs/day: 1.00    Years: 25.00    Total pack years: 25.00    Types: Cigarettes    Quit date: 04/11/1984    Years since quitting: 37.6    Smokeless tobacco: Former    Quit date: 04/02/1985  Vaping Use   Vaping Use: Never used  Substance and Sexual Activity   Alcohol use: Not Currently    Comment: RARE   Drug use: No   Sexual activity: Not Currently  Other Topics Concern   Not on file  Social History Narrative   Lives alone. He had four children and one has deceased. He enjoys watching television.   Social Determinants of Health   Financial Resource Strain: Low Risk  (09/21/2021)   Overall Financial Resource Strain (CARDIA)    Difficulty of Paying Living Expenses: Not hard at all  Food Insecurity: No Food Insecurity (09/21/2021)   Hunger Vital Sign    Worried About Running Out of Food in the Last Year: Never true    Ran Out of Food in the Last Year: Never true  Transportation Needs: No Transportation Needs (09/21/2021)   PRAPARE - Hydrologist (Medical): No    Lack of Transportation (Non-Medical): No  Physical Activity: Sufficiently Active (09/21/2021)   Exercise Vital Sign    Days of Exercise per Week: 5 days    Minutes of Exercise per Session: 40 min  Stress: No Stress Concern Present (09/21/2021)   [place]    Feeling of Stress : Not at all  Social Connections: Moderately Isolated (09/21/2021)   Social Connection and Isolation Panel [NHANES]    Frequency of Communication with Friends and Family: More than three times a week    Frequency of Social Gatherings with Friends and Family: Twice a week    Attends Religious Services: More than 4 times per year    Active Member of [employer] or Organizations: No    Attends Archivist Meetings: Never    Marital Status: Widowed    Family History  Problem Relation Age of Onset   Cancer Mother        breast   Multiple sclerosis Daughter    Cancer Father        prostate   Diabetes Father    Cancer Maternal Uncle        bone    Health Maintenance  Topic Date Due    Diabetic kidney evaluation - Urine ACR  02/05/2021   COVID-19 Vaccine (3 - Mixed Product risk series) 12/10/2021 (Originally 04/07/2021)   Zoster Vaccines- Shingrix (1 of 2) 12/10/2021 (Originally 08/05/1964)   INFLUENZA VACCINE  11/09/2021   FOOT EXAM  12/02/2021   HEMOGLOBIN A1C  03/16/2022   OPHTHALMOLOGY EXAM  10/06/2022   Diabetic kidney evaluation - GFR measurement  10/27/2022   TETANUS/TDAP  04/10/2025   Pneumonia Vaccine 49+ Years old  Completed  Hepatitis C Screening  Completed   HPV VACCINES  Aged Out   COLONOSCOPY (Pts 45-25yr Insurance coverage will need to be confirmed)  Discontinued     ----------------------------------------------------------------------------------------------------------------------------------------------------------------------------------------------------------------- Physical Exam BP (!) 166/61 (BP Location: Left Arm, Patient Position: Sitting, Cuff Size: Normal)   Pulse 61   Ht '6\' 1"'$  (1.854 m)   Wt 226 lb (102.5 kg)   SpO2 98%   BMI 29.82 kg/m   Physical Exam Constitutional:      Appearance: Normal appearance.  Eyes:     General: No scleral icterus. Cardiovascular:     Rate and Rhythm: Normal rate and regular rhythm.  Pulmonary:     Effort: Pulmonary effort is normal.     Breath sounds: Normal breath sounds.  Musculoskeletal:     Cervical back: Neck supple.  Neurological:     Mental Status: He is alert.  Psychiatric:        Mood and Affect: Mood normal.        Behavior: Behavior normal.     ------------------------------------------------------------------------------------------------------------------------------------------------------------------------------------------------------------------- Assessment and Plan  Prosthetic hip infection ([place] Continues to see infectious disease.  Remains on Levaquin long-term.  Stable symptoms at this time.  Type 2 diabetes mellitus with hypoglycemia without coma ([facility]  per endocrinology.  Blood sugar stable at this time current medications.  Essential hypertension Blood pressure elevated at this time.  Readings at home have been better controlled.  He is taking medications as directed.  Intermittent claudication ([place] ABIs without significant change from 2020.  He does have some mild PAD.  I think most of his pain is neuropathic in nature.  Likely related to lumbar spine disease.  He is on gabapentin but this does not seem to be helping a lot.  Adding tramadol as needed.  Alert for QT prolongation given prior to the sending prescription.  I reviewed this medication and effect on QT appears minimal at normal dosing and strength.   Meds ordered this encounter  Medications   traMADol (ULTRAM) 50 MG tablet    Sig: Take 1 tablet (50 mg total) by mouth every 8 (eight) hours as needed for up to 5 days for moderate pain or severe pain.    Dispense:  15 tablet    Refill:  0    Return in about 6 months (around 05/11/2022) for HTN.    This visit occurred during the SARS-CoV-2 public health emergency.  Safety protocols were in place, including screening questions prior to the visit, additional usage of staff PPE, and extensive cleaning of exam room while observing appropriate contact time as indicated for disinfecting solutions.

## 2021-11-08 NOTE — Assessment & Plan Note (Signed)
Blood pressure elevated at this time.  Readings at home have been better controlled.  He is taking medications as directed.

## 2021-11-08 NOTE — Assessment & Plan Note (Signed)
Management per endocrinology.  Blood sugar stable at this time current medications.

## 2021-11-12 ENCOUNTER — Other Ambulatory Visit: Payer: Self-pay | Admitting: Family Medicine

## 2021-11-18 ENCOUNTER — Ambulatory Visit (INDEPENDENT_AMBULATORY_CARE_PROVIDER_SITE_OTHER): Payer: HMO | Admitting: Pharmacist

## 2021-11-18 DIAGNOSIS — I4891 Unspecified atrial fibrillation: Secondary | ICD-10-CM

## 2021-11-18 DIAGNOSIS — E11649 Type 2 diabetes mellitus with hypoglycemia without coma: Secondary | ICD-10-CM

## 2021-11-18 DIAGNOSIS — E785 Hyperlipidemia, unspecified: Secondary | ICD-10-CM

## 2021-11-18 DIAGNOSIS — I10 Essential (primary) hypertension: Secondary | ICD-10-CM

## 2021-11-18 NOTE — Progress Notes (Signed)
Chronic Care Management Pharmacy Note  11/18/2021 Name:  [name] MRN:  [id number] DOB:  15-Jun-1945  Summary: addressed DM, HTN, HLD, Afib.   Of note, he had ten episodes of hypoglycemia recently, patient does not attribute this to any particular pattern or change in diet/medicine. Hypoglycemia was resolved by him keeping peppermints on hand. Counseled on hypoglycemia management.  Glucose per CGM:  Date of Download: 11/18/2021 % Time CGM is active: 94% Average Glucose: 108 mg/dL Glucose Management Indicator: 5.9  Glucose Variability: 19.7 (goal <36%) Time in Goal:  - Time in range 70-180: 98% - Time above range: 0% - Time below range: 2% Observed patterns: 10 readings of hypoglycemia <70   Date of Download: 09/10/21 % Time CGM is active: 51% Average Glucose: 134 mg/dL Glucose Management Indicator: 6.5  Glucose Variability: 17.4 (goal <36%) Time in Goal:  - Time in range 70-180: 97% - Time above range: 3% - Time below range: 0% Observed patterns: breakfast  Cereal, sometimes eggs, sometimes oatmeal   BP numbers as follows:  145/64 166/72 152/74 154/64 164/71 151/69 151/64 149/53 148/64 138/64  Previous readings from our last discussion: ---------- 149/58 149/65 157/73 156/77 144/68 148/67 152/77 144/69  Recommendations/Changes made from today's visit:  - Increase hydralazine to 32m TID - Encouraged patient to discuss hypoglycemia at upcoming endocrinology visit. Concerned about hypoglycemia frequency; possibly an opportunity to discontinue glimepiride.  Plan: f/u with pharmacist in 1-2  months  Subjective: Aaron PASSOWis an 76y.o. year old male who is a primary patient of MLuetta Nutting DO.  The CCM team was consulted for assistance with disease management and care coordination needs.    Engaged with patient by telephone for follow up visit in response to provider referral for pharmacy case management and/or care coordination services.    Consent to Services:  The patient was given information about Chronic Care Management services, agreed to services, and gave verbal consent prior to initiation of services.  Please see initial visit note for detailed documentation.   Patient Care Team: MLuetta Nutting DO as PCP - General (Family Medicine) HCharolette Forward MD as Consulting Physician (Cardiology) KDarius Bump RArrowhead Behavioral Healthas Pharmacist (Pharmacist)  Recent office visits:  09/04/20-Cody MZigmund Daniel DO (PCP) Follow up visit. Freestyle Libre ordered. Follow up in 3 months. 08/07/20-Cody MZigmund Daniel DO (PCP) Hypertension follow up. Start clotrimazole/betamethasone. Labs ordered. Follow up in 4 weeks. 06/15/20-Cody MZigmund Daniel DO (PCP) Diabetic follow up. Follow up in 2 months.   Recent consult visits:  10/22/20-Cornelius N. VTommy Medal MD (Infectious Disease) Follow up visit. Follow up in 2 months. 10/16/20-Cornelius N. VTommy Medal MD (Infectious Disease) Follow up visit. Start on levofloxacin at 500 mg. 10/05/20-Mark THazle Nordmann(Ophthalmology) Diabetic Eye exam. 09/18/20-Cornelius N. VTommy Medal MD (Infectious Disease) Follow up visit. Labs ordered. Follow up in 2 months. 09/04/20-Sean ELoanne Drilling MD (Endocrinology) Diabetic follow up. Decrease Glimepiride 1 mg daily. 08/26/20-Cornelius N. VTommy Medal MD (Infectious Disease) Follow up visit. Labs ordered. 07/03/20-Sean ELoanne Drilling MD (Endocrinology) Diabetic follow up. Reduce the glimepiride to 2 mg each morning. Follow up in 2 months. 06/26/20-Cornelius N. VTommy Medal MD (Infectious Disease) Follow up visit. Labs ordered. Follow up in 2 months. 06/26/20-[name], [name] (Podiatry) Seen for bunion.   Hospital visits:  Medication Reconciliation was completed by comparing discharge summary, patient's EMR and Pharmacy list, and upon discussion with patient.   Admitted to the hospital on 08/01/20 due to Chest pain. [facility]20[facility]  [facility] Discharge:?? -started None noted   Medication Changes at Hospital Discharge: -Changed None noted   Medications Discontinued at Hospital Discharge: -Stopped None noted   Medications that remain the same after Hospital Discharge:??  -All other medications will remain the same.    Objective:  Lab Results  Component Value Date   CREATININE 1.70 (H) 10/26/2021   CREATININE 1.80 (H) 09/13/2021   CREATININE 1.59 (H) 05/08/2021    Lab Results  Component Value Date   HGBA1C 6.5 (A) 09/14/2021   Last diabetic Eye exam:  Lab Results  Component Value Date/Time   HMDIABEYEEXA No Retinopathy 10/05/2021 12:00 AM       Component Value Date/Time   CHOL 159 08/12/2020 0855   TRIG 110 08/12/2020 0855   HDL 41 08/12/2020 0855   CHOLHDL 5.0 (H) 05/18/2020 0000   VLDL 34.6 10/23/2018 1018   LDLCALC 97 08/12/2020 0855   LDLCALC 146 (H) 05/18/2020 0000   LDLDIRECT 132 (H) 10/24/2019 0807       Latest Ref Rng & Units 08/26/2020    3:14 PM 08/12/2020    8:55 AM 05/18/2020   12:00 AM  Hepatic Function  Total Protein 6.1 - 8.1 g/dL 7.1   7.6   Albumin 3.5 - 5.0  4.2       AST 10 - 35 U/L '11  12     12   ' ALT 9 - 46 U/L '10  11     12   ' Alk Phosphatase 25 - 125  89       Total Bilirubin 0.2 - 1.2 mg/dL 0.2   0.3   Bilirubin, Direct 0.0 - 0.2 mg/dL   0.1      This result is from an external source.        Latest Ref Rng & Units 10/26/2021   10:33 AM 09/13/2021    9:35 AM 05/08/2021    4:20 AM  CBC  WBC 3.8 - 10.8 Thousand/uL 4.2  4.6  5.2   Hemoglobin 13.2 - 17.1 g/dL 9.6  9.9  9.3   Hematocrit 38.5 - 50.0 % 29.0  30.1  28.0   Platelets 140 - 400 Thousand/uL 287  293  329     No results found for: "VD25OH"  Clinical ASCVD: Yes  The ASCVD Risk score (Arnett DK, et al., 2019) failed to calculate for the following reasons:   The patient has a prior MI or stroke diagnosis     Social History   Tobacco Use  Smoking Status Former   Packs/day:  1.00   Years: 25.00   Total pack years: 25.00   Types: Cigarettes   Quit date: 04/11/1984   Years since quitting: 37.6  Smokeless Tobacco Former   Quit date: 04/02/1985   BP Readings from Last 3 Encounters:  11/08/21 (!) 166/61  10/26/21 127/74  09/14/21 (!) 152/64   Pulse Readings from Last 3 Encounters:  11/08/21 61  10/26/21 (!) 55  09/14/21 60   Wt Readings from Last 3 Encounters:  11/08/21 226 lb (102.5 kg)  10/26/21 227 lb (103 kg)  09/14/21 229 lb 9.6 oz (104.1 kg)    Assessment: Review of patient past medical history, allergies, medications, health status, including review of consultants reports, laboratory and other test data, was performed as part of comprehensive evaluation and provision of chronic care management services.   SDOH:  (Social Determinants of Health) assessments and interventions performed:    CCM Care Plan  Allergies  Allergen  Reactions   Shellfish Allergy Rash    Medications Reviewed Today     Reviewed by Luetta Nutting, DO (Physician) on 11/08/21 at 2248  Med List Status: <None>   Medication Order Taking? Sig Documenting Provider Last Dose Status Informant  albuterol (VENTOLIN HFA) 108 (90 Base) MCG/ACT inhaler [id number] Yes Inhale 2 puffs into the lungs every 6 (six) hours as needed for wheezing or shortness of breath. Luetta Nutting, DO Taking Active   allopurinol (ZYLOPRIM) 100 MG tablet [id number] Yes TAKE 1/2 TABLET BY MOUTH EVERY DAY Luetta Nutting, DO Taking Active   amiodarone (PACERONE) 200 MG tablet [id number] Yes Take 200 mg by mouth daily. Dr. [name] [provider] Taking Active   aspirin 325 MG tablet [id number] Yes Take by mouth. [provider] Taking Active   atorvastatin (LIPITOR) 40 MG tablet [id number] Yes TAKE 1 TABLET BY MOUTH EVERY DAY FOR HYPERLIPIDEMIA [provider] Taking Active   clotrimazole-betamethasone (LOTRISONE) cream [id number] Yes APPLY 1 APPLICATION TOPICALLY DAILY Luetta Nutting, DO  Taking Active   co-enzyme Q-10 30 MG capsule [id number] Yes Take by mouth. [provider] Taking Active   colchicine 0.6 MG tablet [id number] Yes TAKE 1 TABLET BY MOUTH EVERY DAY Luetta Nutting, DO Taking Active   colesevelam [facility]) 625 MG tablet [id number] Yes TAKE 2 TABLETS (1,250 MG TOTAL) BY MOUTH DAILY. Renato Shin, MD Taking Active   Continuous Blood Gluc Sensor (FREESTYLE LIBRE 3 SENSOR) [place] [id number] Yes 1 DEVICE BY DOES NOT APPLY ROUTE EVERY 14 (FOURTEEN) DAYS. PLACE 1 SENSOR ON THE SKIN EVERY 14 DAYS. USE TO CHECK GLUCOSE CONTINUOUSLY Luetta Nutting, DO Taking Active   cycloSPORINE (RESTASIS) 0.05 % ophthalmic emulsion [id number] Yes Place 1 drop into both eyes 2 (two) times daily. [provider] Taking Active Child           Med Note Broadus John, REGEENA   Tue Mar 03, 2020  7:06 PM)    docusate sodium (COLACE) 100 MG capsule [id number] Yes Take 100 mg by mouth daily as needed for mild constipation. [provider] Taking Active   ferrous sulfate 325 (65 FE) MG tablet [id number] Yes Take 325 mg by mouth daily with breakfast. [provider] Taking Active   gabapentin (NEURONTIN) 300 MG capsule [id number] Yes Take 1 capsule by mouth 2 (two) times daily. [provider] Taking Active   glimepiride (AMARYL) 4 MG tablet [id number] Yes TAKE 1 TABLET BY MOUTH EVERY DAY FOR DIABETES [provider] Taking Active   hydrALAZINE (APRESOLINE) 25 MG tablet [id number] Yes Take 1 tablet by mouth 3 (three) times daily. [provider] Taking Active   hydrochlorothiazide (HYDRODIURIL) 25 MG tablet [id number] Yes  [provider] Taking Active   hydrOXYzine (VISTARIL) 25 MG capsule [id number] Yes TAKE 1 CAPSULE BY MOUTH 3 TIMES DAILY AS NEEDED FOR ITCHING. [provider] Taking Active   levofloxacin (LEVAQUIN) 500 MG tablet [id number] Yes Take 1 tablet by mouth daily. Tommy Medal, Lavell Islam, MD Taking Active   losartan (COZAAR) 25 MG  tablet [id number] Yes TAKE 1 TABLET (25 MG TOTAL) BY MOUTH DAILY. Luetta Nutting, DO Taking Active   methocarbamol (ROBAXIN) 500 MG tablet [id number] Yes Take one tablet by mouth every eight hours as needed for muscle tightness/spasms. [provider] Taking Active   metoprolol succinate (TOPROL-XL) 50 MG 24 hr tablet [id number] Yes Take 50 mg by mouth daily. [provider] Taking Active   omeprazole (PRILOSEC) 40 MG capsule [id number] Yes Take 40 mg by mouth  daily. [provider] Taking Active   potassium chloride (KLOR-CON M10) 10 MEQ tablet [id number] Yes  [provider] Taking Active   predniSONE (DELTASONE) 50 MG tablet [id number] Yes  [provider] Taking Active   sitaGLIPtin (JANUVIA) 50 MG tablet [id number] Yes Take 1 tablet (50 mg total) by mouth daily. Orson Eva, MD Taking Active Child  spironolactone (ALDACTONE) 25 MG tablet [id number] Yes TAKE 1 TABLET BY MOUTH EVERY DAY AS NEEDED FOR EDEMA Luetta Nutting, DO Taking Active   torsemide (DEMADEX) 20 MG tablet [id number] Yes  [provider] Taking Active   traMADol (ULTRAM) 50 MG tablet [id number] Yes Take 1 tablet (50 mg total) by mouth every 8 (eight) hours as needed for up to 5 days for moderate pain or severe pain. 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   Thigh abscess 04/13/2020   CKD (chronic kidney disease) 04/13/2020   Bacteremia    Effusion of hip joint, left    Hip pain 03/03/2020   Gout of right hand 02/23/2020   Hyperkalemia 01/14/2020   Hydroureteronephrosis 01/14/2020   Colon cancer screening 04/28/2019   Intermittent claudication ([place]) 01/22/2019   GERD (gastroesophageal reflux disease) 10/23/2018   Pseudophakia 08/11/2016    Uncontrolled type 2 diabetes mellitus with hyperglycemia, without long-term current use of insulin ([place]) 06/26/2016   Hyperlipidemia with target LDL less than 70 06/26/2016   Angina pectoris ([place]) 06/26/2016   Type 2 diabetes mellitus with hypoglycemia without coma ([place]) 06/24/2016   Essential hypertension 06/24/2016   CAD (coronary artery disease) 06/24/2016   Asthma 06/24/2016   Malignant neoplasm of prostate ([place]) 12/28/2015    Immunization History  Administered Date(s) Administered   Fluad Quad(high Dose 65+) 01/22/2019, 01/21/2020, 03/10/2021   Influenza, High Dose Seasonal PF 02/03/2017, 04/02/2018   Moderna SARS-COV2 Booster Vaccination 03/18/2020   Moderna Sars-Covid-2 Vaccination 06/22/2019, 03/18/2020   Pfizer Covid-19 Vaccine Bivalent Booster 5y-11y 03/10/2021   Pneumococcal Conjugate-13 04/24/2019   Pneumococcal Polysaccharide-23 10/23/2018   Tdap 04/11/2015    Conditions to be addressed/monitored: Atrial Fibrillation, HTN, HLD, and DMII  There are no care plans that you recently modified to display for this patient.       Medication Assistance: None required.  Patient affirms current coverage meets needs.  Patient's preferred pharmacy [facility]:  [id number]Phone: [phone]: [phone] [facility] [place]Phone: [phone]: [phone] [place]Phone: [phone]: [phone]  Uses pill box? No - pulls from original bottles and working well Pt endorses 90% compliance  Follow Up:  Patient agrees to Care Plan and Follow-up.  Plan: Telephone follow up appointment with care management team member scheduled for:  2 months  KLarinda Buttery PharmD Clinical Pharmacist [facility]  [phone]

## 2021-11-18 NOTE — Patient Instructions (Signed)
Visit Information  Thank you for taking time to visit with me today. Please don't hesitate to contact me if I can be of assistance to you before our next scheduled telephone appointment.  Following are the goals we discussed today:   Patient Goals/Self-Care Activities Over the next 60 days, patient will:  take medications as prescribed and check blood pressure daily, document, and provide at future appointments  Follow Up Plan: Telephone follow up appointment with care management team member scheduled for: 2 months  Please call the care guide team at [phone] if you need to cancel or reschedule your appointment.    The patient verbalized understanding of instructions, educational materials, and care plan provided today and agreed to receive a mailed copy of patient instructions, educational materials, and care plan.   [name]

## 2021-11-24 ENCOUNTER — Other Ambulatory Visit: Payer: Self-pay | Admitting: Family Medicine

## 2021-12-03 ENCOUNTER — Encounter (HOSPITAL_COMMUNITY): Payer: Self-pay

## 2021-12-03 ENCOUNTER — Other Ambulatory Visit: Payer: Self-pay

## 2021-12-03 ENCOUNTER — Emergency Department (HOSPITAL_COMMUNITY)
Admission: EM | Admit: 2021-12-03 | Discharge: 2021-12-04 | Disposition: A | Discharge status: Home / Self Care | Payer: HMO | Attending: Emergency Medicine | Admitting: Emergency Medicine

## 2021-12-03 ENCOUNTER — Emergency Department (HOSPITAL_COMMUNITY): Payer: HMO

## 2021-12-03 DIAGNOSIS — S20211A Contusion of right front wall of thorax, initial encounter: Secondary | ICD-10-CM | POA: Insufficient documentation

## 2021-12-03 DIAGNOSIS — W1839XA Other fall on same level, initial encounter: Secondary | ICD-10-CM | POA: Insufficient documentation

## 2021-12-03 DIAGNOSIS — Z7982 Long term (current) use of aspirin: Secondary | ICD-10-CM | POA: Insufficient documentation

## 2021-12-03 DIAGNOSIS — S29011A Strain of muscle and tendon of front wall of thorax, initial encounter: Secondary | ICD-10-CM | POA: Diagnosis present

## 2021-12-03 DIAGNOSIS — R079 Chest pain, unspecified: Secondary | ICD-10-CM | POA: Diagnosis not present

## 2021-12-03 NOTE — ED Triage Notes (Signed)
Fall yesterday after tripping on the curb.   C/o right rib pain below his breast area.

## 2021-12-03 NOTE — ED Provider Triage Note (Signed)
Emergency Medicine Provider Triage Evaluation Note  [name] , a 76 y.o. male  was evaluated in triage.  Pt complains of fall. He states that yesterday he tripped over a curb and landed on the ground on his right side. Denies hitting his head or LOC. Endorses pain only when he takes a deep breath. He is not anticoagulated. Denies any other injuries or complaints  Review of Systems  Positive:  Negative:   Physical Exam  BP (!) 160/71 (BP Location: Left Arm)   Pulse 64   Temp 98.4 F (36.9 C) (Oral)   Resp 18   SpO2 99%  Gen:   Awake, no distress   Resp:  Normal effort  MSK:   Moves extremities without difficulty  Other:  Mild tenderness to palpation over the right anterior ribs. No obvious deformity  Medical Decision Making  Medically screening exam initiated at 11:40 PM.  Appropriate orders placed.  [name] was informed that the remainder of the evaluation will be completed by another provider, this initial triage assessment does not replace that evaluation, and the importance of remaining in the ED until their evaluation is complete.     Bud Face, Hershal Coria 12/03/21 2342

## 2021-12-04 NOTE — ED Provider Notes (Signed)
[facility] DEPT Provider Note   CSN: [id number] Arrival date & time: 12/03/21  2309     History  Chief Complaint  Patient presents with   Lytle Michaels    [name] is a 76 y.o. male who presents for evaluation of rib pain.  Patient states that he fell onto his right side trying to step up over a curb.  He landed on grass.  He did not hit his head or lose consciousness.  Injury occurred yesterday.  He is complaining of pain under the right breast in the rib cage area.  He states that it only hurts when he takes a very deep breath.  He went to make sure he did not have any problems with his lungs.  He denies coughing, shortness of breath.   Fall       Home Medications Prior to Admission medications   Medication Sig Start Date End Date Taking? Authorizing Provider  albuterol (VENTOLIN HFA) 108 (90 Base) MCG/ACT inhaler Inhale 2 puffs into the lungs every 6 (six) hours as needed for wheezing or shortness of breath. 06/09/21   Luetta Nutting, DO  allopurinol (ZYLOPRIM) 100 MG tablet TAKE 1/2 TABLET BY MOUTH EVERY DAY 08/30/21   Luetta Nutting, DO  amiodarone (PACERONE) 200 MG tablet Take 200 mg by mouth daily. Dr. [name]    [provider]  amLODipine (NORVASC) 10 MG tablet TAKE 1 TABLET BY MOUTH EVERY DAY 11/24/21   Luetta Nutting, DO  aspirin 325 MG tablet Take by mouth.    [provider]  atorvastatin (LIPITOR) 40 MG tablet TAKE 1 TABLET BY MOUTH EVERY DAY FOR HYPERLIPIDEMIA    [provider]  clotrimazole-betamethasone (LOTRISONE) cream APPLY 1 APPLICATION TOPICALLY DAILY 11/05/20   Luetta Nutting, DO  co-enzyme Q-10 30 MG capsule Take by mouth.    [provider]  colchicine 0.6 MG tablet TAKE 1 TABLET BY MOUTH EVERY DAY 05/13/21   Luetta Nutting, DO  colesevelam (WELCHOL) 625 MG tablet TAKE 2 TABLETS (1,250 MG TOTAL) BY MOUTH DAILY. 06/23/21   Renato Shin, MD  Continuous Blood Gluc Sensor (FREESTYLE LIBRE 3 SENSOR)  MISC 1 DEVICE BY DOES NOT APPLY ROUTE EVERY 14 (FOURTEEN) DAYS. PLACE 1 SENSOR ON THE SKIN EVERY 14 DAYS. USE TO CHECK GLUCOSE CONTINUOUSLY 08/30/21   Luetta Nutting, DO  cycloSPORINE (RESTASIS) 0.05 % ophthalmic emulsion Place 1 drop into both eyes 2 (two) times daily. 09/23/19   [provider]  docusate sodium (COLACE) 100 MG capsule Take 100 mg by mouth daily as needed for mild constipation.    [provider]  ferrous sulfate 325 (65 FE) MG tablet Take 325 mg by mouth daily with breakfast.    [provider]  gabapentin (NEURONTIN) 300 MG capsule Take 1 capsule by mouth 2 (two) times daily.    [provider]  glimepiride (AMARYL) 4 MG tablet TAKE 1 TABLET BY MOUTH EVERY DAY FOR DIABETES    [provider]  hydrALAZINE (APRESOLINE) 10 MG tablet TAKE 2 TABLETS BY MOUTH 3 TIMES A DAY 11/22/21   Luetta Nutting, DO  hydrALAZINE (APRESOLINE) 25 MG tablet Take 1 tablet by mouth 3 (three) times daily.    [provider]  hydrochlorothiazide (HYDRODIURIL) 25 MG tablet     [provider]  hydrOXYzine (VISTARIL) 25 MG capsule TAKE 1 CAPSULE BY MOUTH 3 TIMES DAILY AS NEEDED FOR ITCHING.    [provider]  levofloxacin (LEVAQUIN) 500 MG tablet Take 1 tablet  by mouth daily. 10/26/21   Truman Hayward, MD  losartan (COZAAR) 25 MG tablet TAKE 1 TABLET (25 MG TOTAL) BY MOUTH DAILY. 07/06/21   Luetta Nutting, DO  methocarbamol (ROBAXIN) 500 MG tablet Take one tablet by mouth every eight hours as needed for muscle tightness/spasms. 06/18/21   [provider]  metoprolol succinate (TOPROL-XL) 50 MG 24 hr tablet Take 50 mg by mouth daily. 08/09/20   [provider]  omeprazole (PRILOSEC) 40 MG capsule Take 40 mg by mouth daily.    [provider]  potassium chloride (KLOR-CON M10) 10 MEQ tablet     [provider]  predniSONE (DELTASONE) 50 MG tablet     [provider]  sitaGLIPtin (JANUVIA) 50 MG  tablet Take 1 tablet (50 mg total) by mouth daily. 06/26/16   Orson Eva, MD  spironolactone (ALDACTONE) 25 MG tablet TAKE 1 TABLET BY MOUTH EVERY DAY AS NEEDED FOR EDEMA 07/19/21   Luetta Nutting, DO  torsemide (DEMADEX) 20 MG tablet     [provider]      Allergies    Shellfish allergy    Review of Systems   Review of Systems  Physical Exam Updated Vital Signs BP (!) 160/71 (BP Location: Left Arm)   Pulse 64   Temp 98.4 F (36.9 C) (Oral)   Resp 18   SpO2 99%  Physical Exam Vitals and nursing note reviewed.  Constitutional:      General: He is not in acute distress.    Appearance: He is well-developed. He is not diaphoretic.  HENT:     Head: Normocephalic and atraumatic.  Eyes:     General: No scleral icterus.    Conjunctiva/sclera: Conjunctivae normal.  Cardiovascular:     Rate and Rhythm: Normal rate and regular rhythm.     Heart sounds: Normal heart sounds.  Pulmonary:     Effort: Pulmonary effort is normal. No respiratory distress.     Breath sounds: Normal breath sounds.  Chest:    Abdominal:     Palpations: Abdomen is soft.     Tenderness: There is no abdominal tenderness.  Musculoskeletal:     Cervical back: Normal range of motion and neck supple.  Skin:    General: Skin is warm and dry.  Neurological:     Mental Status: He is alert.  Psychiatric:        Behavior: Behavior normal.     ED Results / Procedures / Treatments   Labs (all labs ordered are listed, but only abnormal results are displayed) Labs Reviewed - No data to display  EKG None  Radiology DG Chest 2 View  Result Date: 12/03/2021 CLINICAL DATA:  Fall, rib/chest pain. EXAM: CHEST - 2 VIEW COMPARISON:  None Available. FINDINGS: The heart size and mediastinal contours are within normal limits. Both lungs are clear. Thoracic spondylosis. No appreciable rib fracture. IMPRESSION: No active cardiopulmonary disease. Electronically Signed   By: Keane Police D.O.   On: 12/03/2021  23:58    Procedures Procedures    Medications Ordered in ED Medications - No data to display  ED Course/ Medical Decision Making/ A&P                           Medical Decision Making Patient here for evaluation of rib pain.  No obvious fracture on x-ray.  He has some pain when he takes a very deep breath but is not guarding or having  splinted breathing.  Pain is minimal.  He is able to sleep.  Patient provided with reassurance.  Discussed home care and return precautions.  He appears appropriate for discharge at this time.  Amount and/or Complexity of Data Reviewed Radiology: ordered and independent interpretation performed.    Details: I visualized and independently interpreted chest x-ray which shows no acute findings of rib fracture           Final Clinical Impression(s) / ED Diagnoses Final diagnoses:  Contusion of rib on right side, initial encounter    Rx / DC Orders ED Discharge Orders     None         Margarita Mail, PA-C 12/04/21 0109    Orpah Greek, MD 12/04/21 [phone]

## 2021-12-04 NOTE — Discharge Instructions (Signed)
General instructions Do not use any products that contain nicotine or tobacco, such as cigarettes, e-cigarettes, and chewing tobacco. These can delay healing. If you need help quitting, ask your health care provider. Do deep-breathing exercises as told by your health care provider. If you were given an incentive spirometer, use it every 1-2 hours while you are awake, or as recommended by your health care provider. This device measures how well you are filling your lungs with each breath. Keep all follow-up visits. This is important. Contact a health care provider if you have: Increased bruising or swelling. Pain that is not controlled with treatment. A fever. Get help right away if you: Have difficulty breathing or shortness of breath. Develop a continual cough, or you cough up thick or bloody mucus from your lungs (sputum). Feel nauseous or you vomit. Have pain in your abdomen.

## 2021-12-09 DIAGNOSIS — I4891 Unspecified atrial fibrillation: Secondary | ICD-10-CM

## 2021-12-09 DIAGNOSIS — E11649 Type 2 diabetes mellitus with hypoglycemia without coma: Secondary | ICD-10-CM

## 2021-12-09 DIAGNOSIS — I10 Essential (primary) hypertension: Secondary | ICD-10-CM

## 2021-12-09 DIAGNOSIS — E785 Hyperlipidemia, unspecified: Secondary | ICD-10-CM

## 2021-12-10 ENCOUNTER — Other Ambulatory Visit (INDEPENDENT_AMBULATORY_CARE_PROVIDER_SITE_OTHER): Payer: HMO

## 2021-12-10 ENCOUNTER — Telehealth: Payer: Self-pay

## 2021-12-10 ENCOUNTER — Other Ambulatory Visit: Payer: Self-pay | Admitting: Endocrinology

## 2021-12-10 DIAGNOSIS — E11649 Type 2 diabetes mellitus with hypoglycemia without coma: Secondary | ICD-10-CM

## 2021-12-10 DIAGNOSIS — E782 Mixed hyperlipidemia: Secondary | ICD-10-CM | POA: Diagnosis not present

## 2021-12-10 LAB — LIPID PANEL
Cholesterol: 121 mg/dL (ref 0–200)
HDL: 37.4 mg/dL — ABNORMAL LOW (ref >39.00)
LDL Cholesterol: 54 mg/dL (ref 0–99)
NonHDL: 83.42
Total CHOL/HDL Ratio: 3
Triglycerides: 146 mg/dL (ref 0.0–149.0)
VLDL: 29.2 mg/dL (ref 0.0–40.0)

## 2021-12-10 LAB — BASIC METABOLIC PANEL
BUN: 25 mg/dL — ABNORMAL HIGH (ref 6–23)
CO2: 22 mEq/L (ref 19–32)
Calcium: 8.8 mg/dL (ref 8.4–10.5)
Chloride: 110 mEq/L (ref 96–112)
Creatinine, Ser: 1.63 mg/dL — ABNORMAL HIGH (ref 0.40–1.50)
GFR: 40.72 mL/min — ABNORMAL LOW (ref >60.00)
Glucose, Bld: 117 mg/dL — ABNORMAL HIGH (ref 70–99)
Potassium: 4.8 mEq/L (ref 3.5–5.1)
Sodium: 138 mEq/L (ref 135–145)

## 2021-12-10 LAB — HEMOGLOBIN A1C: Hgb A1c MFr Bld: 6.2 % (ref 4.6–6.5)

## 2021-12-10 LAB — MICROALBUMIN / CREATININE URINE RATIO
Creatinine,U: 202.3 mg/dL
Microalb Creat Ratio: 7.4 mg/g (ref 0.0–30.0)
Microalb, Ur: 15 mg/dL — ABNORMAL HIGH (ref 0.0–1.9)

## 2021-12-10 NOTE — Telephone Encounter (Signed)
Patient requesting refill on repaglinide. Looks like another provider stopped medication back in July. Is it ok to fill?

## 2021-12-12 ENCOUNTER — Other Ambulatory Visit: Payer: Self-pay | Admitting: Family Medicine

## 2021-12-15 ENCOUNTER — Encounter: Payer: Self-pay | Admitting: Endocrinology

## 2021-12-15 ENCOUNTER — Ambulatory Visit: Payer: HMO | Admitting: Endocrinology

## 2021-12-15 VITALS — BP 138/70 | HR 62 | Ht 73.0 in | Wt 230.0 lb

## 2021-12-15 DIAGNOSIS — E1169 Type 2 diabetes mellitus with other specified complication: Secondary | ICD-10-CM

## 2021-12-15 DIAGNOSIS — E669 Obesity, unspecified: Secondary | ICD-10-CM

## 2021-12-15 DIAGNOSIS — E785 Hyperlipidemia, unspecified: Secondary | ICD-10-CM

## 2021-12-15 DIAGNOSIS — N183 Chronic kidney disease, stage 3 unspecified: Secondary | ICD-10-CM | POA: Diagnosis not present

## 2021-12-15 NOTE — Progress Notes (Signed)
Patient ID: [name], male   DOB: 27-May-1945, 76 y.o.   MRN: [id number]           Reason for Appointment: Type II Diabetes follow-up   History of Present Illness   Diagnosis date: 2001  Previous history:  He was previously on metformin which was stopped because of renal dysfunction Amaryl was added in 2013 starting at 4 mg daily Januvia was started in 2018 His A1c has ranged between 5.9 and 9.3 but generally lower since 5/22  Recent history:     Non-insulin hypoglycemic drugs: Januvia 50 mg daily, Prandin 0.5 mg 3 times daily, WelChol 1250 mg daily     Side effects from medications: None  Current self management, blood sugar patterns and problems identified:  A1c is better at 6.2, was 6.5, compared to 6.0 He is able to use a CGM with freestyle libre 3 now and this could be downloaded  Although his blood sugars are averaging about 102 before breakfast his lab glucose was 117 fasting  He was changed from Amaryl to Prandin to avoid hypoglycemia on the last visit and to control his readings after meals better He has tried to take this a few minutes before starting to eat as directed Also is trying to eat some yogurt and boiled egg in the morning at breakfast instead of just cereal She is still waiting to see the dietitian However on the Elenor Legato it appears that sometimes he is having low blood sugars between 9 AM and noon even though his breakfast is around 6-6:30 AM He has difficulty doing much exercise because of leg pain Also not able to lose weight, currently BMI is just over 30   Exercise: limited      Monitors blood glucose: With freestyle libre 3  Interpretation of his freestyle [employer] is as follows  Blood sugars are relatively stable with GV only 17 Although he has average blood sugar in the low 100s and lowest blood sugars appear to be between 10 AM-12 noon including occasionally readings below 70 Has only minimal hyperglycemia times 1 in the morning  hours after 6 AM on average blood sugars usually not exceeding 160 No hypoglycemia overnight events blood sugars are in the low 100 range Blood sugars tend to be relatively lower in the afternoons and evenings and no significant hyperglycemia after lunch or dinner Postprandial averages currently are 114 after breakfast, 98 after lunch and 104 after dinner  CGM use % of time   2-week average/GV 102  Time in range        99%  % Time Above 180   % Time above 250   % Time Below 70 1      TIME in range 96% with 0% below 70  PRE-MEAL Fasting Lunch Dinner Bedtime Overall  Glucose range:       Mean/median: 125   132 130   POST-MEAL PC Breakfast PC Lunch PC Dinner  Glucose range:     Mean/median: 160      Weight control:  Wt Readings from Last 3 Encounters:  12/15/21 230 lb (104.3 kg)  11/08/21 226 lb (102.5 kg)  10/26/21 227 lb (103 kg)            Diabetes labs:  Lab Results  Component Value Date   HGBA1C 6.2 12/10/2021   HGBA1C 6.5 (A) 09/14/2021   HGBA1C 6.0 (A) 06/07/2021   Lab Results  Component Value Date   MICROALBUR 15.0 (H) 12/10/2021   [place]  54 12/10/2021   CREATININE 1.63 (H) 12/10/2021     Allergies as of 12/15/2021       Reactions   Shellfish Allergy Rash        Medication List        Accurate as of December 15, 2021  8:45 AM. If you have any questions, ask your nurse or doctor.          albuterol 108 (90 Base) MCG/ACT inhaler Commonly known as: VENTOLIN HFA Inhale 2 puffs into the lungs every 6 (six) hours as needed for wheezing or shortness of breath.   allopurinol 100 MG tablet Commonly known as: ZYLOPRIM TAKE 1/2 TABLET BY MOUTH EVERY DAY   amiodarone 200 MG tablet Commonly known as: PACERONE Take 200 mg by mouth daily. Dr. [name]   amLODipine 10 MG tablet Commonly known as: NORVASC TAKE 1 TABLET BY MOUTH EVERY DAY   aspirin 325 MG tablet Take by mouth.   atorvastatin 40 MG tablet Commonly known as: LIPITOR TAKE 1  TABLET BY MOUTH EVERY DAY FOR HYPERLIPIDEMIA   clotrimazole-betamethasone cream Commonly known as: LOTRISONE APPLY 1 APPLICATION TOPICALLY DAILY   co-enzyme Q-10 30 MG capsule Take by mouth.   colchicine 0.6 MG tablet TAKE 1 TABLET BY MOUTH EVERY DAY   colesevelam 625 MG tablet Commonly known as: WELCHOL TAKE 2 TABLETS (1,250 MG TOTAL) BY MOUTH DAILY.   cycloSPORINE 0.05 % ophthalmic emulsion Commonly known as: RESTASIS Place 1 drop into both eyes 2 (two) times daily.   docusate sodium 100 MG capsule Commonly known as: COLACE Take 100 mg by mouth daily as needed for mild constipation.   ferrous sulfate 325 (65 FE) MG tablet Take 325 mg by mouth daily with breakfast.   FreeStyle Libre 3 Sensor Misc 1 DEVICE BY DOES NOT APPLY ROUTE EVERY 14 (FOURTEEN) DAYS. PLACE 1 SENSOR ON THE SKIN EVERY 14 DAYS. USE TO CHECK GLUCOSE CONTINUOUSLY   gabapentin 300 MG capsule Commonly known as: NEURONTIN Take 1 capsule by mouth 2 (two) times daily.   hydrALAZINE 25 MG tablet Commonly known as: APRESOLINE Take 1 tablet by mouth 3 (three) times daily.   hydrALAZINE 10 MG tablet Commonly known as: APRESOLINE TAKE 2 TABLETS BY MOUTH 3 TIMES A DAY   hydrochlorothiazide 25 MG tablet Commonly known as: HYDRODIURIL   hydrOXYzine 25 MG capsule Commonly known as: VISTARIL TAKE 1 CAPSULE BY MOUTH 3 TIMES DAILY AS NEEDED FOR ITCHING.   Klor-Con M10 10 MEQ tablet Generic drug: potassium chloride   levofloxacin 500 MG tablet Commonly known as: LEVAQUIN Take 1 tablet by mouth daily.   losartan 25 MG tablet Commonly known as: COZAAR TAKE 1 TABLET (25 MG TOTAL) BY MOUTH DAILY.   methocarbamol 500 MG tablet Commonly known as: ROBAXIN Take one tablet by mouth every eight hours as needed for muscle tightness/spasms.   metoprolol succinate 50 MG 24 hr tablet Commonly known as: TOPROL-XL Take 50 mg by mouth daily.   omeprazole 40 MG capsule Commonly known as: PRILOSEC Take 40 mg by mouth  daily.   predniSONE 50 MG tablet Commonly known as: DELTASONE   repaglinide 0.5 MG tablet Commonly known as: PRANDIN Take 1 tablet (0.5 mg total) by mouth 3 (three) times daily before meals.   sitaGLIPtin 50 MG tablet Commonly known as: JANUVIA Take 1 tablet (50 mg total) by mouth daily.   spironolactone 25 MG tablet Commonly known as: ALDACTONE TAKE 1 TABLET BY MOUTH EVERY DAY AS NEEDED FOR EDEMA   torsemide  20 MG tablet Commonly known as: DEMADEX        Allergies:  Allergies  Allergen Reactions   Shellfish Allergy Rash    Past Medical History:  Diagnosis Date   Arthritis    Asthma    no inhaler   Atrial fibrillation ([place]) 10/16/2020   Coronary artery disease    cardiologist-  dr spruill; last visit 3 mos ago per pt   Enterobacter sepsis ([facility]) 04/13/2020   GERD (gastroesophageal reflux disease)    History of MI (myocardial infarction)    1985   Hydronephrosis, left    Hypertension    Myocardial infarction ([facility])    Nephrolithiasis    left   Neuromuscular disorder ([facility])    TINGLING IN BOTH HANDS   Presence of tooth-root and mandibular implants    lower dental implants   Prostate cancer ([facility])    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   QT prolongation 10/16/2020   Shortness of breath    WITH EXERTION   Thigh abscess 04/13/2020   Type 2 diabetes mellitus ([place])    Zoster 09/13/2021    Past Surgical History:  Procedure Laterality Date   BUNIONECTOMY     CYSTOSCOPY W/ RETROGRADES Left 03/14/2014   Procedure: CYSTOSCOPY WITH LEFT RETROGRADE PYELOGRAM, Left Ureteroscopy, [place] ureteral Stent No string;  Surgeon: [name], MD;  Location: [facility];  Service: Urology;  Laterality: Left;   CYSTOSCOPY W/ URETERAL STENT PLACEMENT Bilateral 01/15/2020   Procedure: CYSTOSCOPY WITH RETROGRADE PYELOGRAM/URETERAL STENT PLACEMENT;  Surgeon: Alexis Frock, MD;  Location: [facility];  Service: Urology;  Laterality: Bilateral;   CYSTOSCOPY/URETEROSCOPY/HOLMIUM  LASER/STENT PLACEMENT Bilateral 03/17/2020   Procedure: CYSTOSCOPY/URETEROSCOPY/HOLMIUM LASER/STENT LEFT PLACEMENT;  Surgeon: Festus Aloe, MD;  Location: [facility];  Service: Urology;  Laterality: Bilateral;   HERNIA REPAIR     INCISION AND DRAINAGE Left 03/09/2020   Procedure: INCISION AND DRAINAGE LEFT HIP WITH LINER EXCHANGE;  Surgeon: Gaynelle Arabian, MD;  Location: [facility];  Service: Orthopedics;  Laterality: Left;   IR FLUORO GUIDE CV LINE RIGHT  03/10/2020   IR RADIOLOGIST EVAL & MGMT  04/01/2020   IR RADIOLOGIST EVAL & MGMT  04/15/2020   IR RADIOLOGIST EVAL & MGMT  04/30/2020   IR REMOVAL TUN CV CATH W/O FL  04/22/2020   IR US GUIDE BX ASP/DRAIN  03/17/2020   IR US GUIDE VASC ACCESS RIGHT  03/10/2020   PROSTATE BIOPSY     TOTAL HIP ARTHROPLASTY Left 03-20-2009   TOTAL HIP ARTHROPLASTY  04/09/2012   Procedure: TOTAL HIP ARTHROPLASTY;  Surgeon: [name], MD;  Location: [facility];  Service: Orthopedics;  Laterality: Right;   TOTAL KNEE ARTHROPLASTY Left 05-16-2008    Family History  Problem Relation Age of Onset   Cancer Mother        breast   Multiple sclerosis Daughter    Cancer Father        prostate   Diabetes Father    Cancer Maternal Uncle        bone    Social History:  reports that he quit smoking about 37 years ago. His smoking use included cigarettes. He has a 25.00 pack-year smoking history. He quit smokeless tobacco use about 36 years ago. He reports that he does not currently use alcohol. He reports that he does not use drugs.  Review of Systems:  Last diabetic eye exam date 09/2000  Last foot exam date: 8/22 He has symptoms of burning and pain in his feet  not always relieved by gabapentin He sometimes just takes Tylenol His PCP had prescribed Lyrica but he did not apparently tolerate this  Hypertension: His medications include 25 mg losartan  BP Readings from Last 3 Encounters:  12/15/21 138/70  12/03/21 (!) 160/71  11/08/21 (!) 166/61   Lab Results   Component Value Date   CREATININE 1.63 (H) 12/10/2021   CREATININE 1.70 (H) 10/26/2021   CREATININE 1.80 (H) 09/13/2021    Lipids: He has been managed with 40 mg rosuvastatin and WelChol followed by cardiology     Lab Results  Component Value Date   CHOL 121 12/10/2021   CHOL 159 08/12/2020   CHOL 223 (H) 05/18/2020   Lab Results  Component Value Date   HDL 37.40 (L) 12/10/2021   HDL 41 08/12/2020   HDL 45 05/18/2020   Lab Results  Component Value Date   LDLCALC 54 12/10/2021   LDLCALC 97 08/12/2020   LDLCALC 146 (H) 05/18/2020   Lab Results  Component Value Date   TRIG 146.0 12/10/2021   TRIG 110 08/12/2020   TRIG 182 (H) 05/18/2020   Lab Results  Component Value Date   CHOLHDL 3 12/10/2021   CHOLHDL 5.0 (H) 05/18/2020   CHOLHDL 5 10/23/2018   Lab Results  Component Value Date   LDLDIRECT 132 (H) 10/24/2019      Examination:   BP 138/70   Pulse 62   Ht '6\' 1"'$  (1.854 m)   Wt 230 lb (104.3 kg)   SpO2 98%   BMI 30.34 kg/m   Body mass index is 30.34 kg/m.    ASSESSMENT/ PLAN:    Diabetes type 2 on oral agents:   Current regimen: Prandin, Januvia and WelChol  A1c is 6.2 with recent CGM indicating an average of only 102  His blood sugars are excellent and improved even without taking Amaryl and only continuing low-dose Januvia and Prandin Blood sugars are not as high with Prandin at breakfast although not clear why he has some low normal readings or low readings late morning despite eating some protein in the morning Does not appear to have any high readings after lunch As above his freestyle libre may be slightly lower than actual glucose  Discussed that he can stop taking Prandin at lunch and if he continues to have low sugars after breakfast may stop the morning dose also  Chronic kidney disease: Likely from multiple factors Asked him to make a follow-up with his nephrologist Urine microalbumin normal   LIPIDS: LDL is excellent and he will  continue to follow-up with cardiology Also will need to see if his cardiologist is checking his thyroid levels while taking amiodarone   There are no Patient Instructions on file for this visit.   Elayne Snare 12/15/2021, 8:45 AM

## 2021-12-15 NOTE — Patient Instructions (Signed)
Stop Prandin at lunch

## 2021-12-17 ENCOUNTER — Encounter: Payer: Self-pay | Admitting: Endocrinology

## 2021-12-17 ENCOUNTER — Encounter: Payer: Self-pay | Admitting: Dietician

## 2021-12-17 ENCOUNTER — Encounter: Payer: HMO | Attending: Endocrinology | Admitting: Dietician

## 2021-12-17 DIAGNOSIS — N1832 Chronic kidney disease, stage 3b: Secondary | ICD-10-CM

## 2021-12-17 DIAGNOSIS — E11649 Type 2 diabetes mellitus with hypoglycemia without coma: Secondary | ICD-10-CM | POA: Diagnosis not present

## 2021-12-17 NOTE — Patient Instructions (Addendum)
Drink more water rather than sweetened drinks. Avoid added salt. Rinse off canned beans Eat at home more than out Avoid any "Lite Salt" and "No Salt" or other seasons with Potassium in the ingredient list.  THROW IT OUT Choose foods off the low potassium list overall. Chow chow could be a low sodium condiment.  Read the label. When you eat out, ask them to prepare your food without salt.

## 2021-12-17 NOTE — Progress Notes (Signed)
Diabetes Self-Management Education  Visit Type: First/Initial  Appt. Start Time: 1635 Appt. End Time: 2330  12/17/2021  [name], identified by name and date of birth, is a 76 y.o. male with a diagnosis of Diabetes: Type 2.   ASSESSMENT Patient is here today alone. Referred by Dr. Dwyane Dee for Type 2 diabetes. He is not sure what he needs to learn. He is eating a fair amount of sugar through muffins, treats, and regular soda.  Blood glucose is well controlled.  Sensor reading currently 101 and steady. Potassium has been high.  Uses Potassium containing salt substitutes at home.  Continues spironolactone per patient.  History includes Type 2 diabetes (2001), HTN, CKD, GERD, MI Medications include Repaglinide, januvia, torsemide, spironolactone, hydrochlorothiazide, iron, co-Q10 Labs noted to include:  eGFR 41, BUN 25, Creatine 1.63 (10/26/2021), A1C 6.2% 12/10/2021, potasium was 4.8 12/10/21 decreased from 5.6 72023 and he stated that he stopped the potassium supplement CGM:  Libre 3 - 102 before breakfast Low blood glucose 2 times in the past week 62-69 (after breakfast or after dinner) and treats appropriately  Weight hx: 64" 229 lbs 12/17/2021 215 lbs 04/2020 after being hospitalized for sepsis  Patient lives alone.  Does not cook much and eats out frequently. He is retired from the [employer]. Allergic to Shellfish. Unable to walk much due to leg weakness (likely due to back per patient) and works on his upper body and legs from a sitting position at the [facility].  Height _0  (1.88 m), weight 229 lb (103.9 kg). Body mass index is 29.4 kg/m.   Diabetes Self-Management Education - 12/17/21 1451       Visit Information   Visit Type First/Initial      Initial Visit   Diabetes Type Type 2    Date Diagnosed 2002    Are you currently following a meal plan? No    Are you taking your medications as prescribed? Yes      Health Coping   How would you rate your overall  health? Poor      Psychosocial Assessment   Patient Belief/Attitude about Diabetes Motivated to manage diabetes    What is the hardest part about your diabetes right now, causing you the most concern, or is the most worrisome to you about your diabetes?   Being active;Making healty food and beverage choices    Self-care barriers None    Self-management support Doctor's office    Other persons present Patient    Patient Concerns Nutrition/Meal planning    Special Needs None    Preferred Learning Style No preference indicated    Learning Readiness Ready    How often do you need to have someone help you when you read instructions, pamphlets, or other written materials from your doctor or pharmacy? 1 - Never    What is the last grade level you completed in school? 9      Pre-Education Assessment   Patient understands the diabetes disease and treatment process. Needs Review    Patient understands incorporating nutritional management into lifestyle. Needs Review    Patient undertands incorporating physical activity into lifestyle. Needs Review    Patient understands using medications safely. Needs Review    Patient understands monitoring blood glucose, interpreting and using results Needs Review    Patient understands prevention, detection, and treatment of acute complications. Needs Review    Patient understands prevention, detection, and treatment of chronic complications. Needs Review    Patient understands  how to develop strategies to address psychosocial issues. Needs Review    Patient understands how to develop strategies to promote health/change behavior. Needs Review      Complications   Last HgB A1C per patient/outside source 6.2 %   12/10/2021   How often do you check your blood sugar? 3-4 times/day    Fasting Blood glucose range (mg/dL) 70-129    Number of hypoglycemic episodes per month 2    Can you tell when your blood sugar is low? Yes    What do you do if your blood sugar is  low? eats a peppermint and drinks OJ    Number of hyperglycemic episodes ( >231m/dL): Never    Have you had a dilated eye exam in the past 12 months? Yes    Have you had a dental exam in the past 12 months? Yes    Are you checking your feet? Yes    How many days per week are you checking your feet? 7      Dietary Intake   Breakfast 1 cup apple cheerios with almond milk, yogurt, nuts, boiled egg    Snack (morning) occasional boiled egg, tomato sandwich on white bread OR blueberry muffin    Lunch tomato sandwich on white OR BBQ (out) OR mixed vegetables   1200   Snack (afternoon) rare fruit    Dinner tomato sandwhich OR blueberry muffin OR BBQ OR pizza OR salad with grilled chicken with ranch from a restaurant    Snack (evening) blueberry muffin or pumpkin roll OR ice cream sandwich    Beverage(s) OJ, 2 cans regular sprite, water, coffee with cream and sweet and low      Activity / Exercise   Activity / Exercise Type ADL's;Light (walking / raking leaves)      Patient Education   Previous Diabetes Education Yes (please comment)   through [facility] (comment);Plate Method   nutrition therapy for CKD and diabetes   Being Active Role of exercise on diabetes management, blood pressure control and cardiac health.    Medications Reviewed patients medication for diabetes, action, purpose, timing of dose and side effects.    Monitoring Yearly dilated eye exam;Daily foot exams;Taught/evaluated CGM (comment)   99% in range   Acute complications Taught prevention, symptoms, and  treatment of hypoglycemia - the 15 rule.      Individualized Goals (developed by patient)   Nutrition General guidelines for healthy choices and portions discussed;Other (comment)   renal   Physical Activity Exercise 3-5 times per week;30 minutes per day    Medications take my medication as prescribed    Monitoring  Consistenly use CGM    Problem Solving Eating Pattern    Health Coping Discuss  barriers to diabetes care with support person/system (comment specifics as needed)      Post-Education Assessment   Patient understands the diabetes disease and treatment process. Demonstrates understanding / competency    Patient understands incorporating nutritional management into lifestyle. Comprehends key points    Patient undertands incorporating physical activity into lifestyle. Comprehends key points    Patient understands using medications safely. Comphrehends key points    Patient understands monitoring blood glucose, interpreting and using results Comprehends key points    Patient understands prevention, detection, and treatment of acute complications. Comprehends key points    Patient understands prevention, detection, and treatment of chronic complications. Comprehends key points    Patient understands how to develop strategies to address psychosocial issues.  Comprehends key points    Patient understands how to develop strategies to promote health/change behavior. Comprehends key points      Outcomes   Expected Outcomes Demonstrated interest in learning. Expect positive outcomes    Future DMSE PRN    Program Status Completed             Individualized Plan for Diabetes Self-Management Training:   Learning Objective:  Patient will have a greater understanding of diabetes self-management. Patient education plan is to attend individual and/or group sessions per assessed needs and concerns.   Plan:   Patient Instructions  Drink more water rather than sweetened drinks. Avoid added salt. Rinse off canned beans Eat at home more than out Avoid any "Lite Salt" and "No Salt" or other seasons with Potassium in the ingredient list.  THROW IT OUT Choose foods off the low potassium list overall. Chow chow could be a low sodium condiment.  Read the label. When you eat out, ask them to prepare your food without salt.  Expected Outcomes:  Demonstrated interest in learning. Expect  positive outcomes  Education material provided: Food label handouts, NKD national kidney diet - Dish up a Kidney-Friendly Meal for Patients with Chronic Kidney Disease (not on dialysis)  If problems or questions, patient to contact team via:  Phone  Future DSME appointment: PRN

## 2021-12-30 ENCOUNTER — Ambulatory Visit (INDEPENDENT_AMBULATORY_CARE_PROVIDER_SITE_OTHER): Payer: HMO | Admitting: Pharmacist

## 2021-12-30 DIAGNOSIS — I10 Essential (primary) hypertension: Secondary | ICD-10-CM

## 2021-12-30 DIAGNOSIS — E785 Hyperlipidemia, unspecified: Secondary | ICD-10-CM

## 2021-12-30 DIAGNOSIS — E11649 Type 2 diabetes mellitus with hypoglycemia without coma: Secondary | ICD-10-CM

## 2021-12-30 NOTE — Progress Notes (Signed)
Chronic Care Management Pharmacy Note  12/30/2021 Name:  [name] MRN:  [id number] DOB:  1945-05-09  Summary: addressed DM, HTN, HLD, Afib.   Patient is doing well on medications, lab values at goal. He states the colesevelam has become unaffordable ($200) and he did not want to fill it. He also expresses cost difficulty with colchicine.   Glucose per CGM:  Date of Download: 12/30/21 % Time CGM is active: 90% Average Glucose: 107 mg/dL Glucose Management Indicator: 5.9%  Glucose Variability: 14% (goal <36%) Time in Goal:  - Time in range 70-180: 100% - Time above range: 0% - Time below range: 0% Observed patterns: no more hypoglycemia after endocrine decreased prandin from TID to BID and discontinued glimepiride.   Date of Download: 11/18/2021 % Time CGM is active: 94% Average Glucose: 108 mg/dL Glucose Management Indicator: 5.9  Glucose Variability: 19.7 (goal <36%) Time in Goal:  - Time in range 70-180: 98% - Time above range: 0% - Time below range: 2% Observed patterns: 10 readings of hypoglycemia <70   BP numbers as follows:  145/64 166/72 152/74 154/64 164/71 151/69 151/64 149/53 148/64 138/64   Recommendations/Changes made from today's visit:  - Recommend monitor lipids off of colsevelam, as patient will run out and not refill. This may be appropriate given currently controlled LDL. - Provided patient assistance application for colchicine, arranged to be placed at front desk for pickup, fill out, and return.  Plan: f/u with pharmacist in 1-2  months  Subjective: [name] is an 76 y.o. year old male who is a primary patient of Luetta Nutting, DO.  The CCM team was consulted for assistance with disease management and care coordination needs.    Engaged with patient by telephone for follow up visit in response to provider referral for pharmacy case management and/or care coordination services.   Consent to Services:  The patient was given  information about Chronic Care Management services, agreed to services, and gave verbal consent prior to initiation of services.  Please see initial visit note for detailed documentation.   Patient Care Team: Luetta Nutting, DO as PCP - General (Family Medicine) Charolette Forward, MD as Consulting Physician (Cardiology) [name], [facility] as Pharmacist (Pharmacist)  Recent office visits:  09/04/20-Cody Zigmund Daniel, DO (PCP) Follow up visit. Freestyle Libre ordered. Follow up in 3 months. 08/07/20-Cody Zigmund Daniel, DO (PCP) Hypertension follow up. Start clotrimazole/betamethasone. Labs ordered. Follow up in 4 weeks. 06/15/20-Cody Zigmund Daniel, DO (PCP) Diabetic follow up. Follow up in 2 months.   Recent consult visits:  10/22/20-Cornelius N. Tommy Medal, MD (Infectious Disease) Follow up visit. Follow up in 2 months. 10/16/20-Cornelius N. Tommy Medal, MD (Infectious Disease) Follow up visit. Start on levofloxacin at 500 mg. 10/05/20-Mark Hazle Nordmann (Ophthalmology) Diabetic Eye exam. 09/18/20-Cornelius N. Tommy Medal, MD (Infectious Disease) Follow up visit. Labs ordered. Follow up in 2 months. 09/04/20-Sean Loanne Drilling, MD (Endocrinology) Diabetic follow up. Decrease Glimepiride 1 mg daily. 08/26/20-Cornelius N. Tommy Medal, MD (Infectious Disease) Follow up visit. Labs ordered. 07/03/20-Sean Loanne Drilling, MD (Endocrinology) Diabetic follow up. Reduce the glimepiride to 2 mg each morning. Follow up in 2 months. 06/26/20-Cornelius N. Tommy Medal, MD (Infectious Disease) Follow up visit. Labs ordered. Follow up in 2 months. 06/26/20-[name], [name] (Podiatry) Seen for bunion.   Hospital visits:  Medication Reconciliation was completed by comparing discharge summary, patient's EMR and Pharmacy list, and upon discussion with patient.   Admitted to the hospital on 08/01/20 due to Chest pain. Discharge date was  08/01/20. Discharged from [facility]?Medications Started at [facility]  Discharge:?? -started None noted   Medication Changes at Hospital Discharge: -Changed None noted   Medications Discontinued at Hospital Discharge: -Stopped None noted   Medications that remain the same after Hospital Discharge:??  -All other medications will remain the same.    Objective:  Lab Results  Component Value Date   CREATININE 1.63 (H) 12/10/2021   CREATININE 1.70 (H) 10/26/2021   CREATININE 1.80 (H) 09/13/2021    Lab Results  Component Value Date   HGBA1C 6.2 12/10/2021   Last diabetic Eye exam:  Lab Results  Component Value Date/Time   HMDIABEYEEXA No Retinopathy 10/05/2021 12:00 AM       Component Value Date/Time   CHOL 121 12/10/2021 0821   TRIG 146.0 12/10/2021 0821   HDL 37.40 (L) 12/10/2021 0821   CHOLHDL 3 12/10/2021 0821   VLDL 29.2 12/10/2021 0821   LDLCALC 54 12/10/2021 0821   LDLCALC 146 (H) 05/18/2020 0000   LDLDIRECT 132 (H) 10/24/2019 0807       Latest Ref Rng & Units 08/26/2020    3:14 PM 08/12/2020    8:55 AM 05/18/2020   12:00 AM  Hepatic Function  Total Protein 6.1 - 8.1 g/dL 7.1   7.6   Albumin 3.5 - 5.0  4.2       AST 10 - 35 U/L '11  12     12   ' ALT 9 - 46 U/L '10  11     12   ' Alk Phosphatase 25 - 125  89       Total Bilirubin 0.2 - 1.2 mg/dL 0.2   0.3   Bilirubin, Direct 0.0 - 0.2 mg/dL   0.1      This result is from an external source.        Latest Ref Rng & Units 10/26/2021   10:33 AM 09/13/2021    9:35 AM 05/08/2021    4:20 AM  CBC  WBC 3.8 - 10.8 Thousand/uL 4.2  4.6  5.2   Hemoglobin 13.2 - 17.1 g/dL 9.6  9.9  9.3   Hematocrit 38.5 - 50.0 % 29.0  30.1  28.0   Platelets 140 - 400 Thousand/uL 287  293  329     No results found for: "VD25OH"  Clinical ASCVD: Yes  The ASCVD Risk score (Arnett DK, et al., 2019) failed to calculate for the following reasons:   The patient has a prior MI or stroke diagnosis     Social History   Tobacco Use  Smoking Status Former   Packs/day: 1.00   Years: 25.00   Total pack  years: 25.00   Types: Cigarettes   Quit date: 04/11/1984   Years since quitting: 37.7  Smokeless Tobacco Former   Quit date: 04/02/1985   BP Readings from Last 3 Encounters:  12/15/21 138/70  12/03/21 (!) 160/71  11/08/21 (!) 166/61   Pulse Readings from Last 3 Encounters:  12/15/21 62  12/03/21 64  11/08/21 61   Wt Readings from Last 3 Encounters:  12/17/21 229 lb (103.9 kg)  12/15/21 230 lb (104.3 kg)  11/08/21 226 lb (102.5 kg)    Assessment: Review of patient past medical history, allergies, medications, health status, including review of consultants reports, laboratory and other test data, was performed as part of comprehensive evaluation and provision of chronic care management services.   SDOH:  (Social Determinants of Health) assessments and interventions performed:  CCM Care Plan  Allergies  Allergen Reactions   Shellfish Allergy Rash    Medications Reviewed Today     Reviewed by Clydell Hakim, RD (Registered Dietitian) on 12/17/21 at 1450  Med List Status: <None>   Medication Order Taking? Sig Documenting Provider Last Dose Status Informant  albuterol (VENTOLIN HFA) 108 (90 Base) MCG/ACT inhaler [id number] Yes Inhale 2 puffs into the lungs every 6 (six) hours as needed for wheezing or shortness of breath. Luetta Nutting, DO Taking Active   allopurinol (ZYLOPRIM) 100 MG tablet [id number] Yes TAKE 1/2 TABLET BY MOUTH EVERY DAY Luetta Nutting, DO Taking Active   amiodarone (PACERONE) 200 MG tablet [id number] Yes Take 200 mg by mouth daily. Dr. [name] [provider] Taking Active   amLODipine (NORVASC) 10 MG tablet [id number] Yes TAKE 1 TABLET BY MOUTH EVERY DAY Luetta Nutting, DO Taking Active   aspirin 325 MG tablet [id number] Yes Take by mouth. [provider] Taking Active   atorvastatin (LIPITOR) 40 MG tablet [id number] Yes TAKE 1 TABLET BY MOUTH EVERY DAY FOR HYPERLIPIDEMIA [provider] Taking Active   clotrimazole-betamethasone  (LOTRISONE) cream [id number]  APPLY 1 APPLICATION TOPICALLY DAILY Luetta Nutting, DO  Active   co-enzyme Q-10 30 MG capsule [id number] Yes Take by mouth. [provider] Taking Active   colchicine 0.6 MG tablet [id number] Yes TAKE 1 TABLET BY MOUTH EVERY DAY Luetta Nutting, DO Taking Active   colesevelam [facility]) 625 MG tablet [id number] Yes TAKE 2 TABLETS (1,250 MG TOTAL) BY MOUTH DAILY. Renato Shin, MD Taking Active   Continuous Blood Gluc Sensor (FREESTYLE LIBRE 3 SENSOR) [place] [id number] Yes 1 DEVICE BY DOES NOT APPLY ROUTE EVERY 14 (FOURTEEN) DAYS. PLACE 1 SENSOR ON THE SKIN EVERY 14 DAYS. USE TO CHECK GLUCOSE CONTINUOUSLY Luetta Nutting, DO Taking Active   cycloSPORINE (RESTASIS) 0.05 % ophthalmic emulsion [id number] Yes Place 1 drop into both eyes 2 (two) times daily. [provider] Taking Active Child           Med Note Broadus John, REGEENA   Tue Mar 03, 2020  7:06 PM)    docusate sodium (COLACE) 100 MG capsule [id number] No Take 100 mg by mouth daily as needed for mild constipation.  Patient not taking: Reported on 12/15/2021   [provider] Not Taking Active   ferrous sulfate 325 (65 FE) MG tablet [id number] Yes Take 325 mg by mouth daily with breakfast. [provider] Taking Active   gabapentin (NEURONTIN) 300 MG capsule [id number] Yes Take 1 capsule by mouth 2 (two) times daily. [provider] Taking Active   hydrALAZINE (APRESOLINE) 10 MG tablet [id number] Yes TAKE 2 TABLETS BY MOUTH 3 TIMES A DAY Luetta Nutting, DO Taking Active   hydrALAZINE (APRESOLINE) 25 MG tablet [id number] Yes Take 1 tablet by mouth 3 (three) times daily. [provider] Taking Active   hydrochlorothiazide (HYDRODIURIL) 25 MG tablet [id number] Yes  [provider] Taking Active   hydrOXYzine (VISTARIL) 25 MG capsule [id number] Yes TAKE 1 CAPSULE BY MOUTH 3 TIMES DAILY AS NEEDED FOR ITCHING. [provider] Taking Active   levofloxacin (LEVAQUIN) 500 MG  tablet [id number] Yes Take 1 tablet by mouth daily. Tommy Medal, Lavell Islam, MD Taking Active   losartan (COZAAR) 25 MG tablet [id number] Yes TAKE 1 TABLET (25 MG TOTAL) BY MOUTH DAILY. Luetta Nutting, DO Taking Active   metoprolol succinate (TOPROL-XL) 50 MG 24 hr tablet [id number] Yes Take 50 mg by mouth daily. [provider] Taking Active  omeprazole (PRILOSEC) 40 MG capsule [id number] Yes Take 40 mg by mouth daily. [provider] Taking Active   polyethylene glycol (MIRALAX / GLYCOLAX) 17 g packet [id number] Yes Take 17 g by mouth daily. [provider] Taking Active   potassium chloride (KLOR-CON M10) 10 MEQ tablet [id number] Yes  [provider] Taking Active   repaglinide (PRANDIN) 0.5 MG tablet [id number] Yes Take 1 tablet (0.5 mg total) by mouth 3 (three) times daily before meals. Elayne Snare, MD Taking Active   sitaGLIPtin (JANUVIA) 50 MG tablet [id number] Yes Take 1 tablet (50 mg total) by mouth daily. 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   Thigh abscess 04/13/2020   CKD (chronic kidney disease) 04/13/2020   Bacteremia    Effusion of hip joint, left    Hip pain 03/03/2020   Gout of right hand 02/23/2020   Hyperkalemia 01/14/2020   Hydroureteronephrosis 01/14/2020   Colon cancer screening 04/28/2019   Intermittent claudication ([place]) 01/22/2019   GERD (gastroesophageal reflux disease) 10/23/2018   Pseudophakia 08/11/2016   Uncontrolled type 2 diabetes mellitus with hyperglycemia,  without long-term current use of insulin ([place]) 06/26/2016   Hyperlipidemia with target LDL less than 70 06/26/2016   Angina pectoris ([place]) 06/26/2016   Type 2 diabetes mellitus with hypoglycemia without coma ([place]) 06/24/2016   Essential hypertension 06/24/2016   CAD (coronary artery disease) 06/24/2016   Asthma 06/24/2016   Malignant neoplasm of prostate ([place]) 12/28/2015    Immunization History  Administered Date(s) Administered   Fluad Quad(high Dose 65+) 01/22/2019, 01/21/2020, 03/10/2021   Influenza, High Dose Seasonal PF 02/03/2017, 04/02/2018   Moderna SARS-COV2 Booster Vaccination 03/18/2020   Moderna Sars-Covid-2 Vaccination 06/22/2019, 03/18/2020   Pfizer Covid-19 Vaccine Bivalent Booster 5y-11y 03/10/2021   Pneumococcal Conjugate-13 04/24/2019   Pneumococcal Polysaccharide-23 10/23/2018   Tdap 04/11/2015    Conditions to be addressed/monitored: Atrial Fibrillation, HTN, HLD, and DMII  There are no care plans that you recently modified to display for this patient.       Medication Assistance: None required.  Patient affirms current coverage meets needs.  Patient's preferred pharmacy [facility]  [id number] [address]Phone: [phone]: [phone] [facility] [address]Phone: [phone]: [address]Phone: [phone]: [phone]  Uses pill box? No - pulls from original bottles and working well Pt endorses 90% compliance  Follow Up:  Patient agrees to Care Plan and Follow-up.  Plan: Telephone follow up appointment with care management team member scheduled for:  2 months  KLarinda Buttery PharmD Clinical Pharmacist [phone]

## 2022-01-07 DIAGNOSIS — Z96652 Presence of left artificial knee joint: Secondary | ICD-10-CM | POA: Diagnosis not present

## 2022-01-07 DIAGNOSIS — Z96642 Presence of left artificial hip joint: Secondary | ICD-10-CM | POA: Diagnosis not present

## 2022-01-07 DIAGNOSIS — M25561 Pain in right knee: Secondary | ICD-10-CM | POA: Diagnosis not present

## 2022-01-07 DIAGNOSIS — M1711 Unilateral primary osteoarthritis, right knee: Secondary | ICD-10-CM | POA: Diagnosis not present

## 2022-01-18 ENCOUNTER — Other Ambulatory Visit: Payer: Self-pay | Admitting: Family Medicine

## 2022-01-18 DIAGNOSIS — I10 Essential (primary) hypertension: Secondary | ICD-10-CM

## 2022-02-16 ENCOUNTER — Other Ambulatory Visit: Payer: Self-pay | Admitting: Family Medicine

## 2022-02-24 ENCOUNTER — Other Ambulatory Visit: Payer: Self-pay | Admitting: Family Medicine

## 2022-03-08 ENCOUNTER — Other Ambulatory Visit: Payer: Self-pay | Admitting: Endocrinology

## 2022-03-14 IMAGING — US US BIOPSY FNA W/ IMAGING
1 series · 13 of 17 positions shown · non-contrast
Comparison: Left hip CT-03/01/2020

INDICATION: History of left total hip replacement, now with concern for
infection.

Please perform ultrasound-guided aspiration and/or drainage catheter
placement for infection source control purposes.
EXAM:
ULTRASOUND-GUIDED DRAINAGE CATHETER PLACEMENT INTO DOMINANT FLUID
COLLECTION INVOLVING THE PROXIMAL LATERAL ASPECT THE LEFT THIGH

[Series 1: us biopsy fna w/ imaging · 13 of 17 slices shown]
[im 1/17]
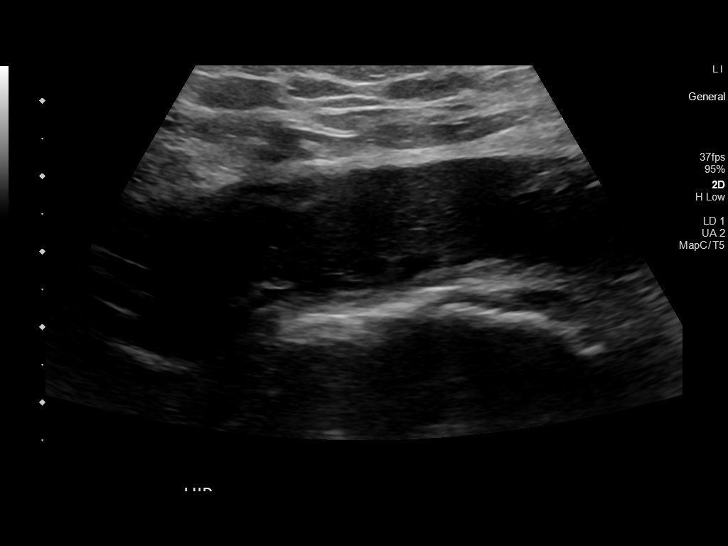
[im 2/17]
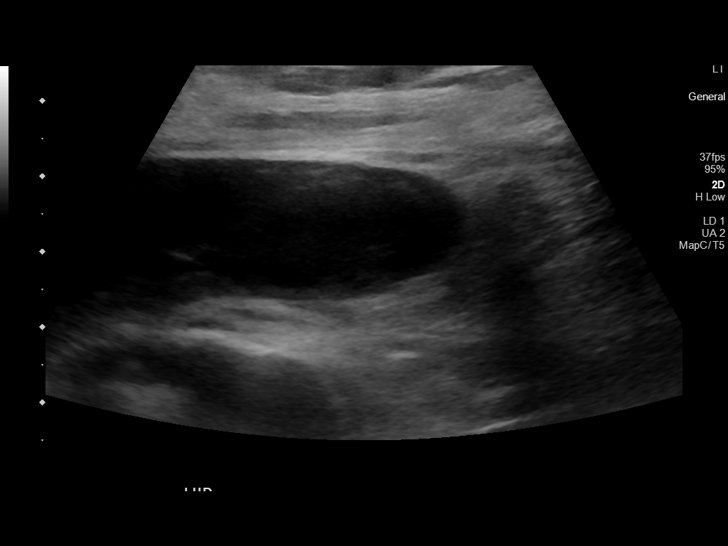
[im 4/17]
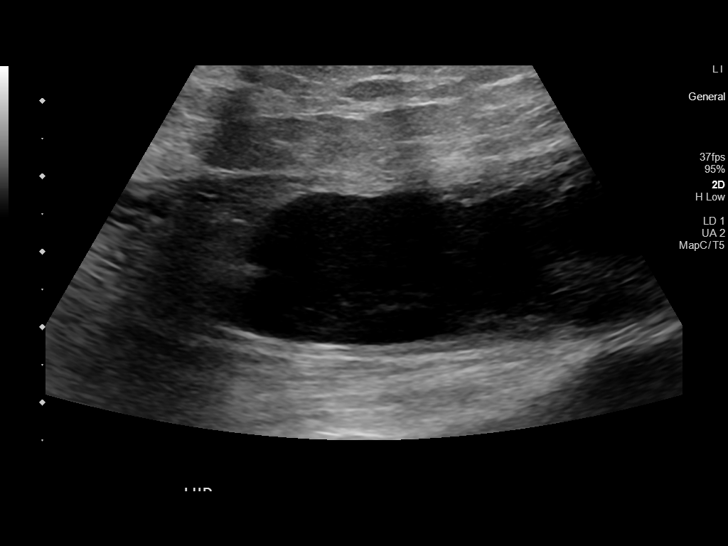
[im 5/17]
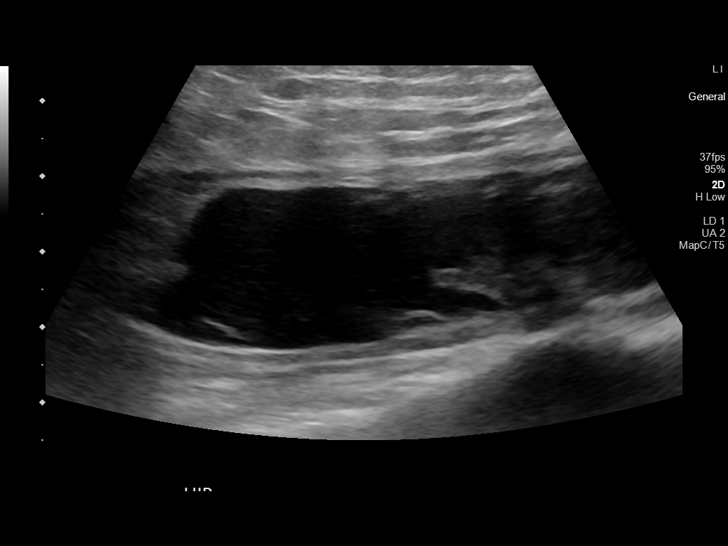
[im 6/17]
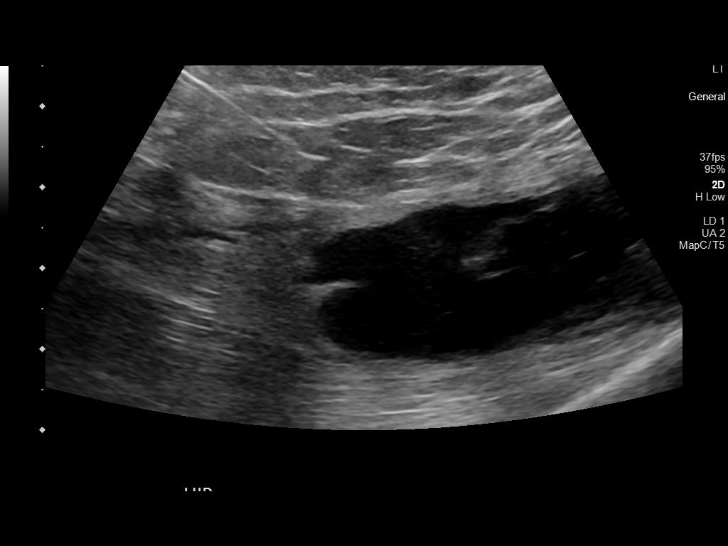
[im 8/17]
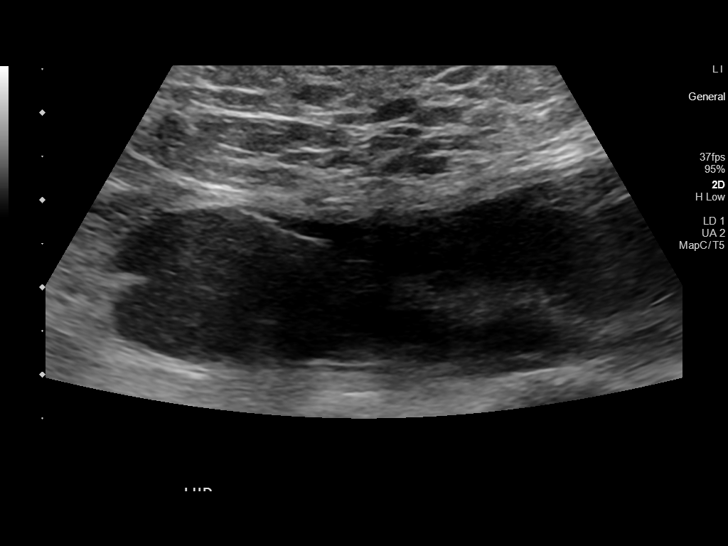
[im 9/17]
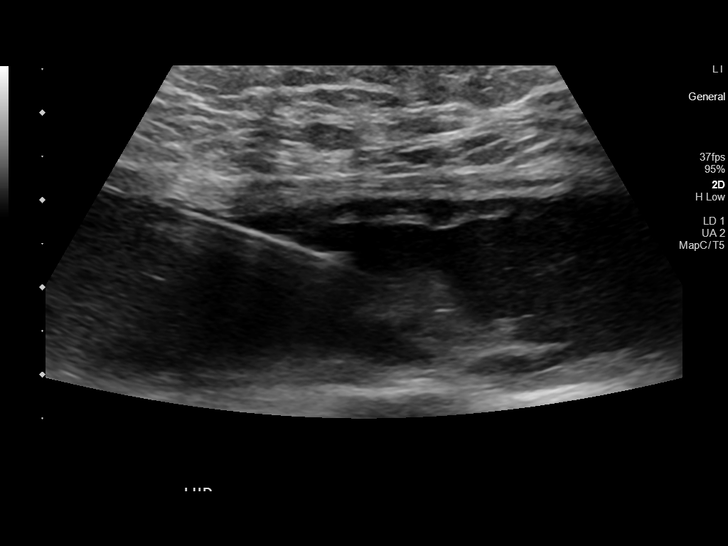
[im 10/17]
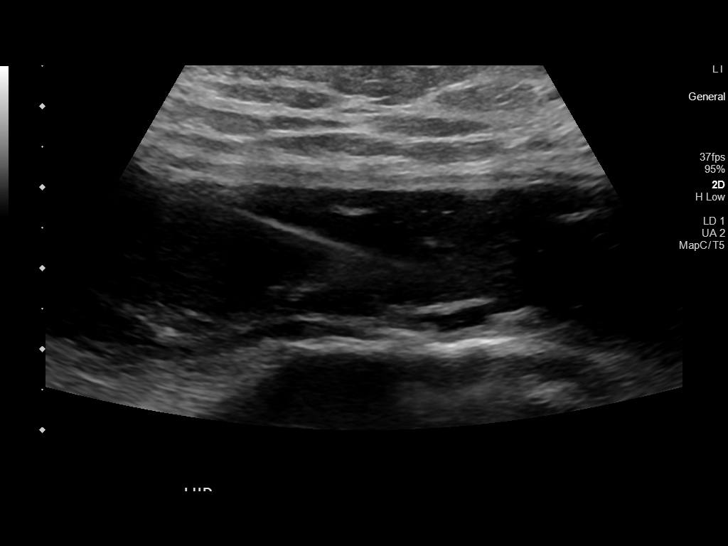
[im 12/17]
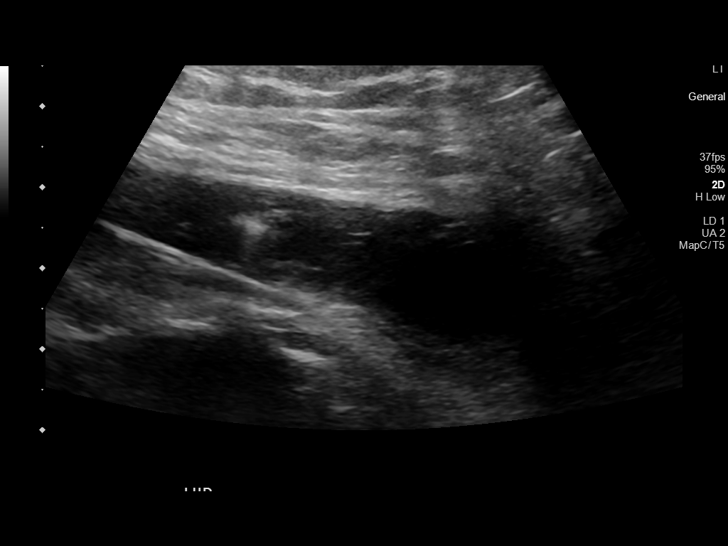
[im 13/17]
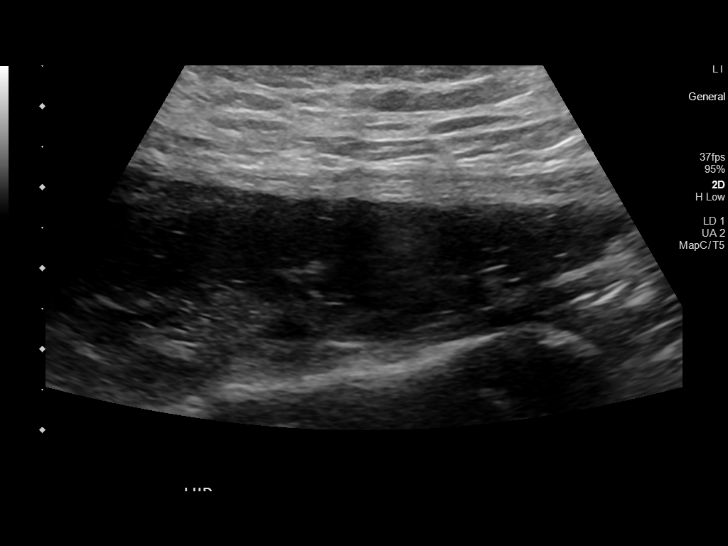
[im 14/17]
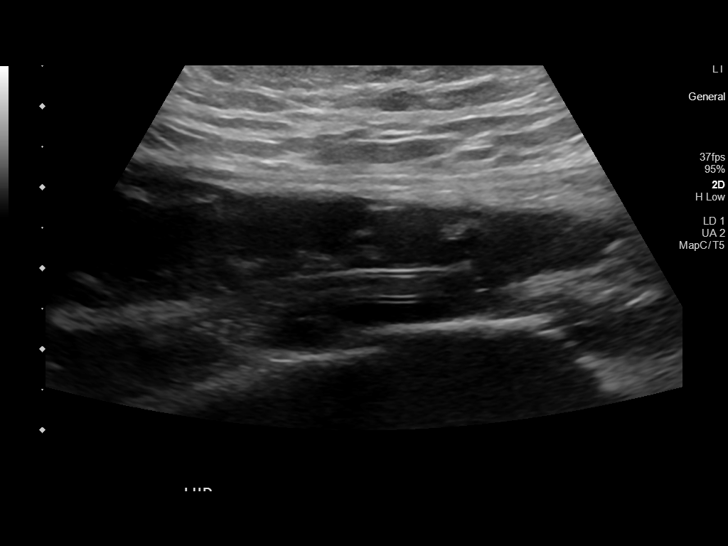
[im 16/17]
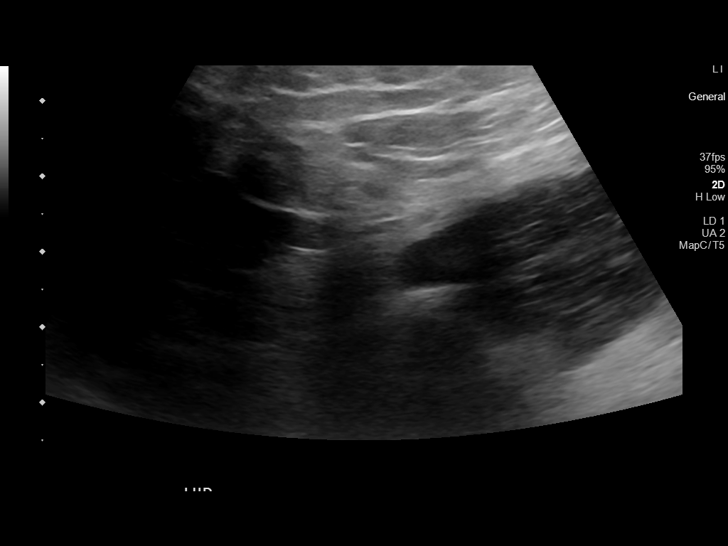
[im 17/17]
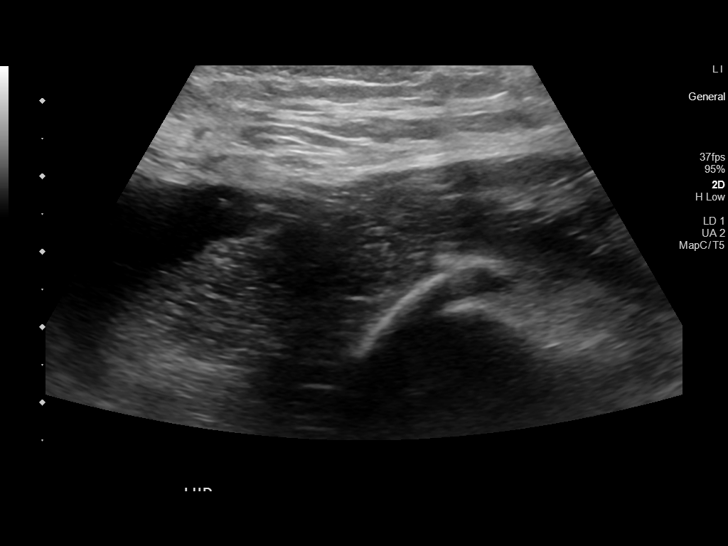

[13 of 17 positions shown; findings below may reference images not displayed]

MEDICATIONS:
The patient is currently admitted to the hospital and receiving
intravenous antibiotics. The antibiotics were administered within an
appropriate time frame prior to the initiation of the procedure.

ANESTHESIA/SEDATION:
Moderate (conscious) sedation was employed during this procedure. A
total of Versed 2 mg and Fentanyl 100 mcg was administered
intravenously.

Moderate Sedation Time: 18 minutes. The patient's level of
consciousness and vital signs were monitored continuously by
radiology nursing throughout the procedure under my direct
supervision.

CONTRAST:  None

COMPLICATIONS:
None immediate.

PROCEDURE:
Informed written consent was obtained from the patient after a
discussion of the risks, benefits and alternatives to treatment. The
patient was placed supine on his hospital bed with ultrasound
imaging demonstrating a large (at least 7.6 x 1.6 cm complex fluid
collection with the subcutaneous tissues involving the lateral
aspect of the proximal left thigh (representative image 2). The
procedure was planned. A timeout was performed prior to the
initiation of the procedure.

The skin overlying the lateral aspect the left thigh was prepped and
draped in the usual sterile fashion. The overlying soft tissues were
anesthetized with 1% lidocaine with epinephrine.

Utilizing a caudal to cranial approach, an 18 gauge trocar needle
was advanced through near the entirety of the abscess/fluid
collection within the left lateral thigh and a short Amplatz super
stiff wire was coiled within the cranial component of the
collection. Appropriate positioning was confirmed ultrasound
imaging. The tract was serially dilated allowing placement of a 12
French all-purpose drainage catheter. Appropriate positioning was
confirmed with a limited postprocedural CT scan.

Next, approximately 50 cc of purulent fluid was aspirated. The tube
was connected to a JP bulb and sutured in place. A dressing was
placed. The patient tolerated the procedure well without immediate
post procedural complication.
IMPRESSION: Successful CT guided placement of a 12 French all purpose drain
catheter into the complex fluid collection involving the proximal
lateral aspect the left thigh with aspiration of 50 cc of purulent
fluid. Samples were sent to the laboratory as requested by the
ordering clinical team.

## 2022-03-15 IMAGING — DX DG ABD PORTABLE 1V
2 series · 2 of 2 positions shown · non-contrast
Comparison: March 04, 2020

CLINICAL DATA: SBO

EXAM:
PORTABLE ABDOMEN - 1 VIEW

[abdomen kub (1 of 2)]
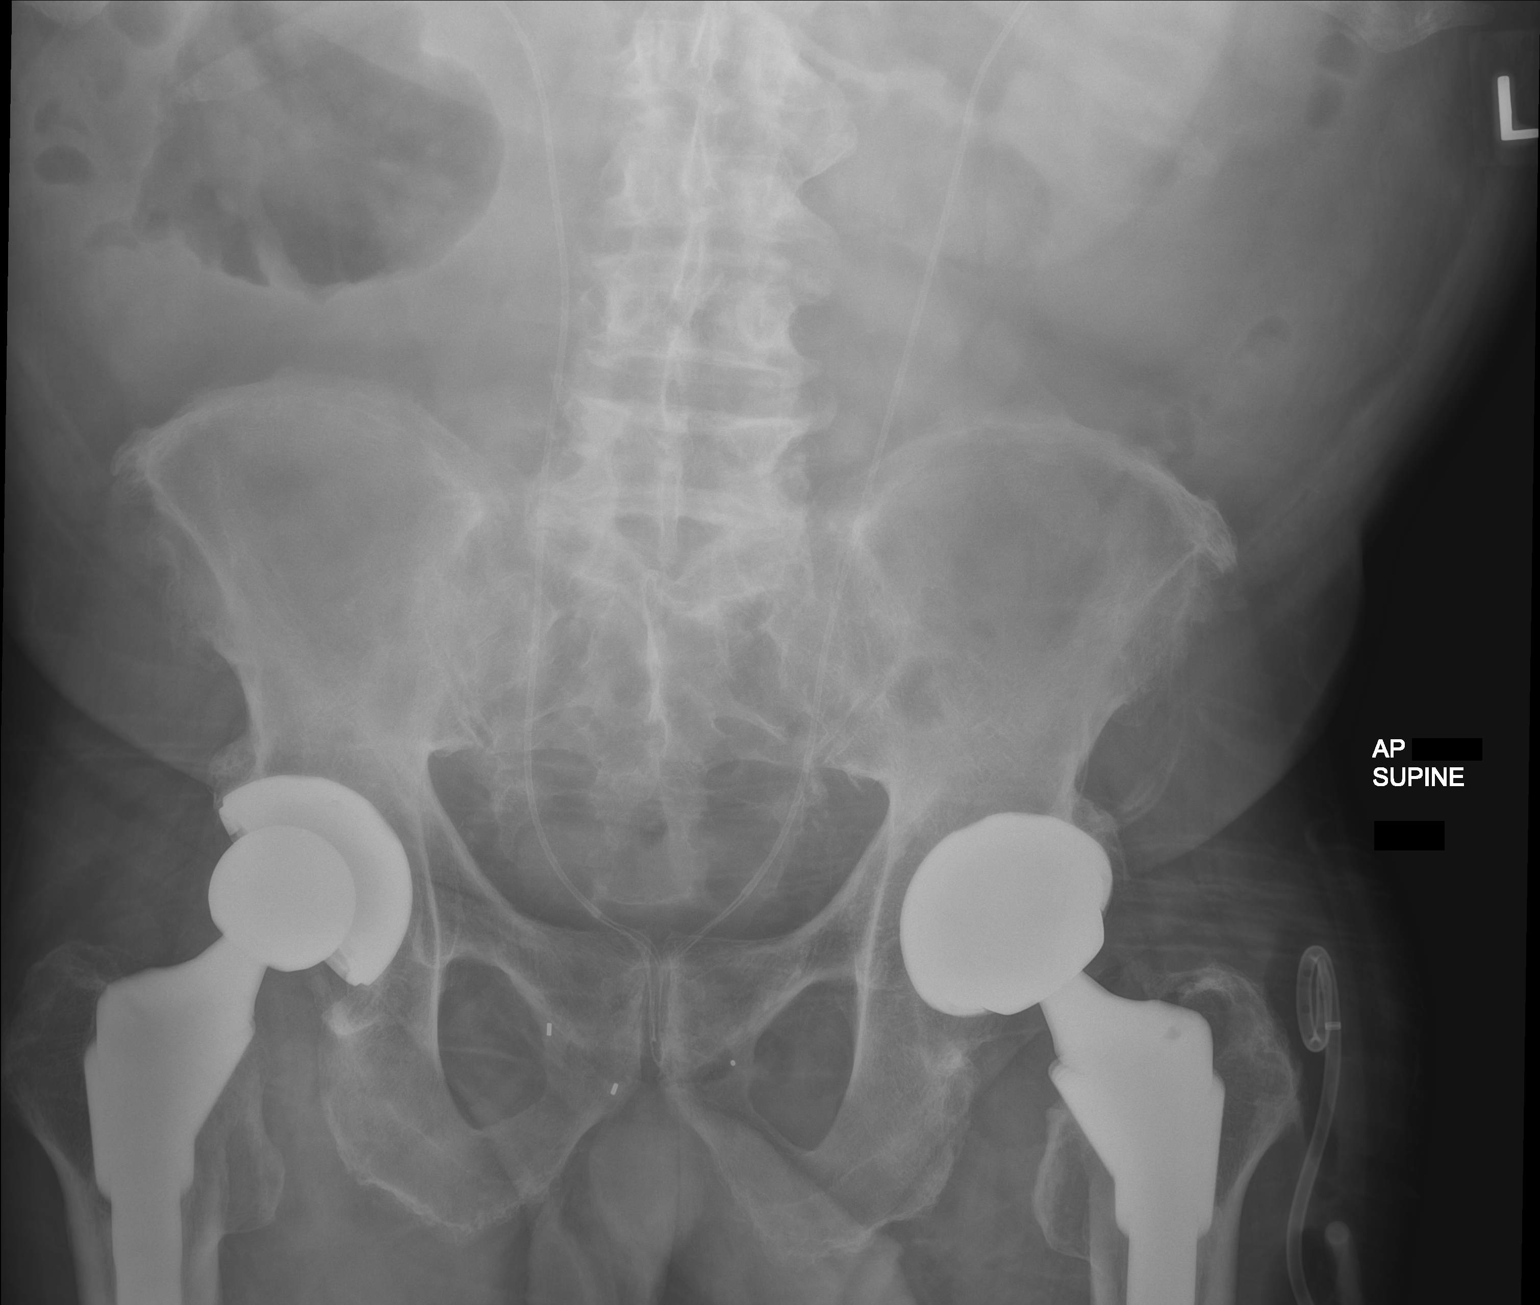

[abdomen kub (2 of 2)]
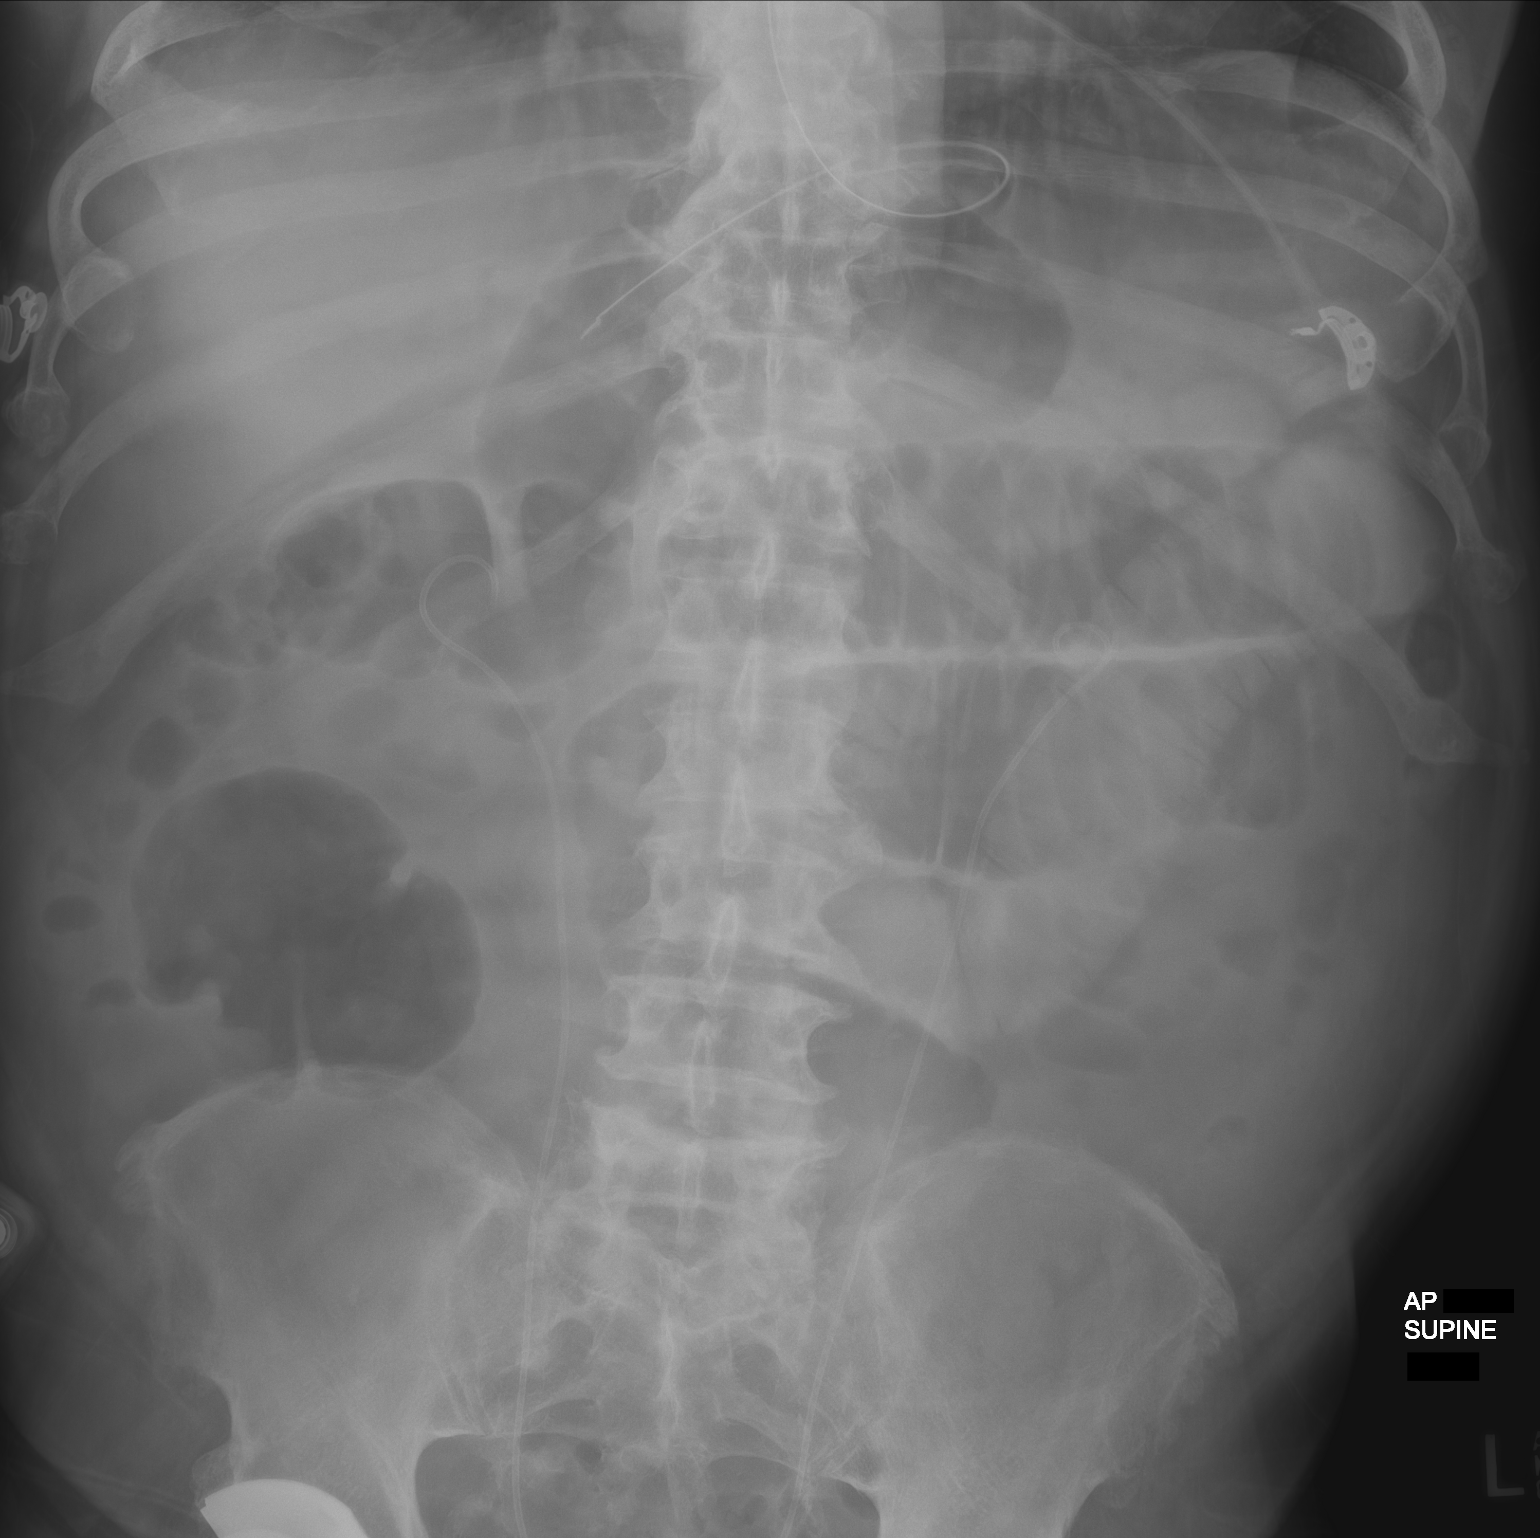

[2 of 2 positions shown; findings below may reference images not displayed]

FINDINGS: Persistent gaseous dilation of loops of small bowel. Small bowel
loop measures up to 5.2 cm, similar in comparison to prior. There is
enteric contrast within a dilated loop of proximal small bowel.
Enteric tube is coiled within the stomach with tip terminating over
the distal stomach. Bilateral double-J stents. LEFT-sided surgical
drain overlying the LEFT lateral leg. Soft tissue air of the LEFT
lateral leg. Status post bilateral hip arthroplasty. Degenerative
changes of the lower lumbar spine.
IMPRESSION: Persistent gaseous dilation of loops of small bowel, similar in
comparison to prior. Enteric contrast is within the dilated proximal
small bowel.

## 2022-03-16 IMAGING — DX DG ABD PORTABLE 1V
2 series · 2 of 2 positions shown · non-contrast
Comparison: 03/05/2020 abdominal radiographs and prior.

CLINICAL DATA: Small-bowel obstruction.

EXAM:
PORTABLE ABDOMEN - 1 VIEW

[abdomen kub (1 of 2)]
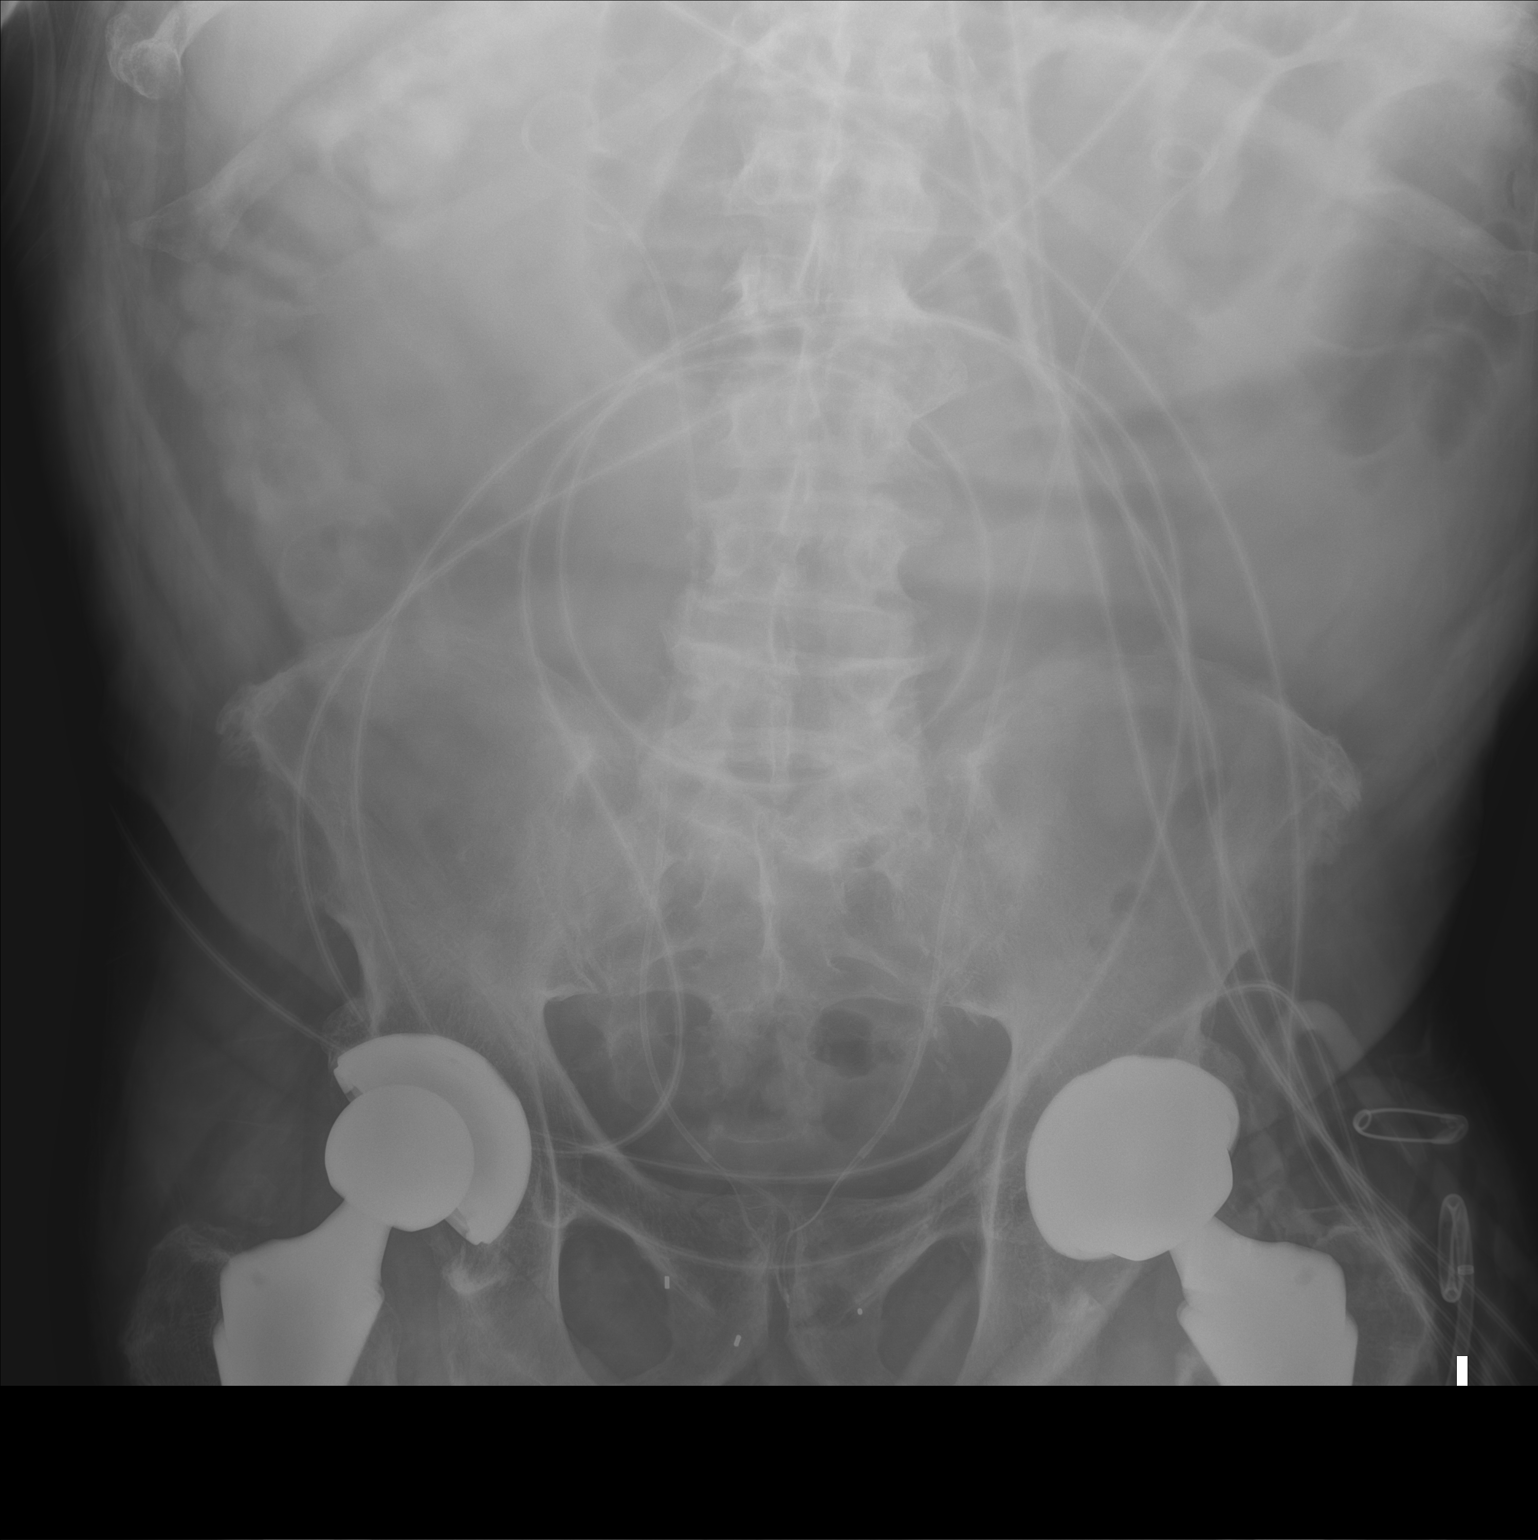

[abdomen kub (2 of 2)]
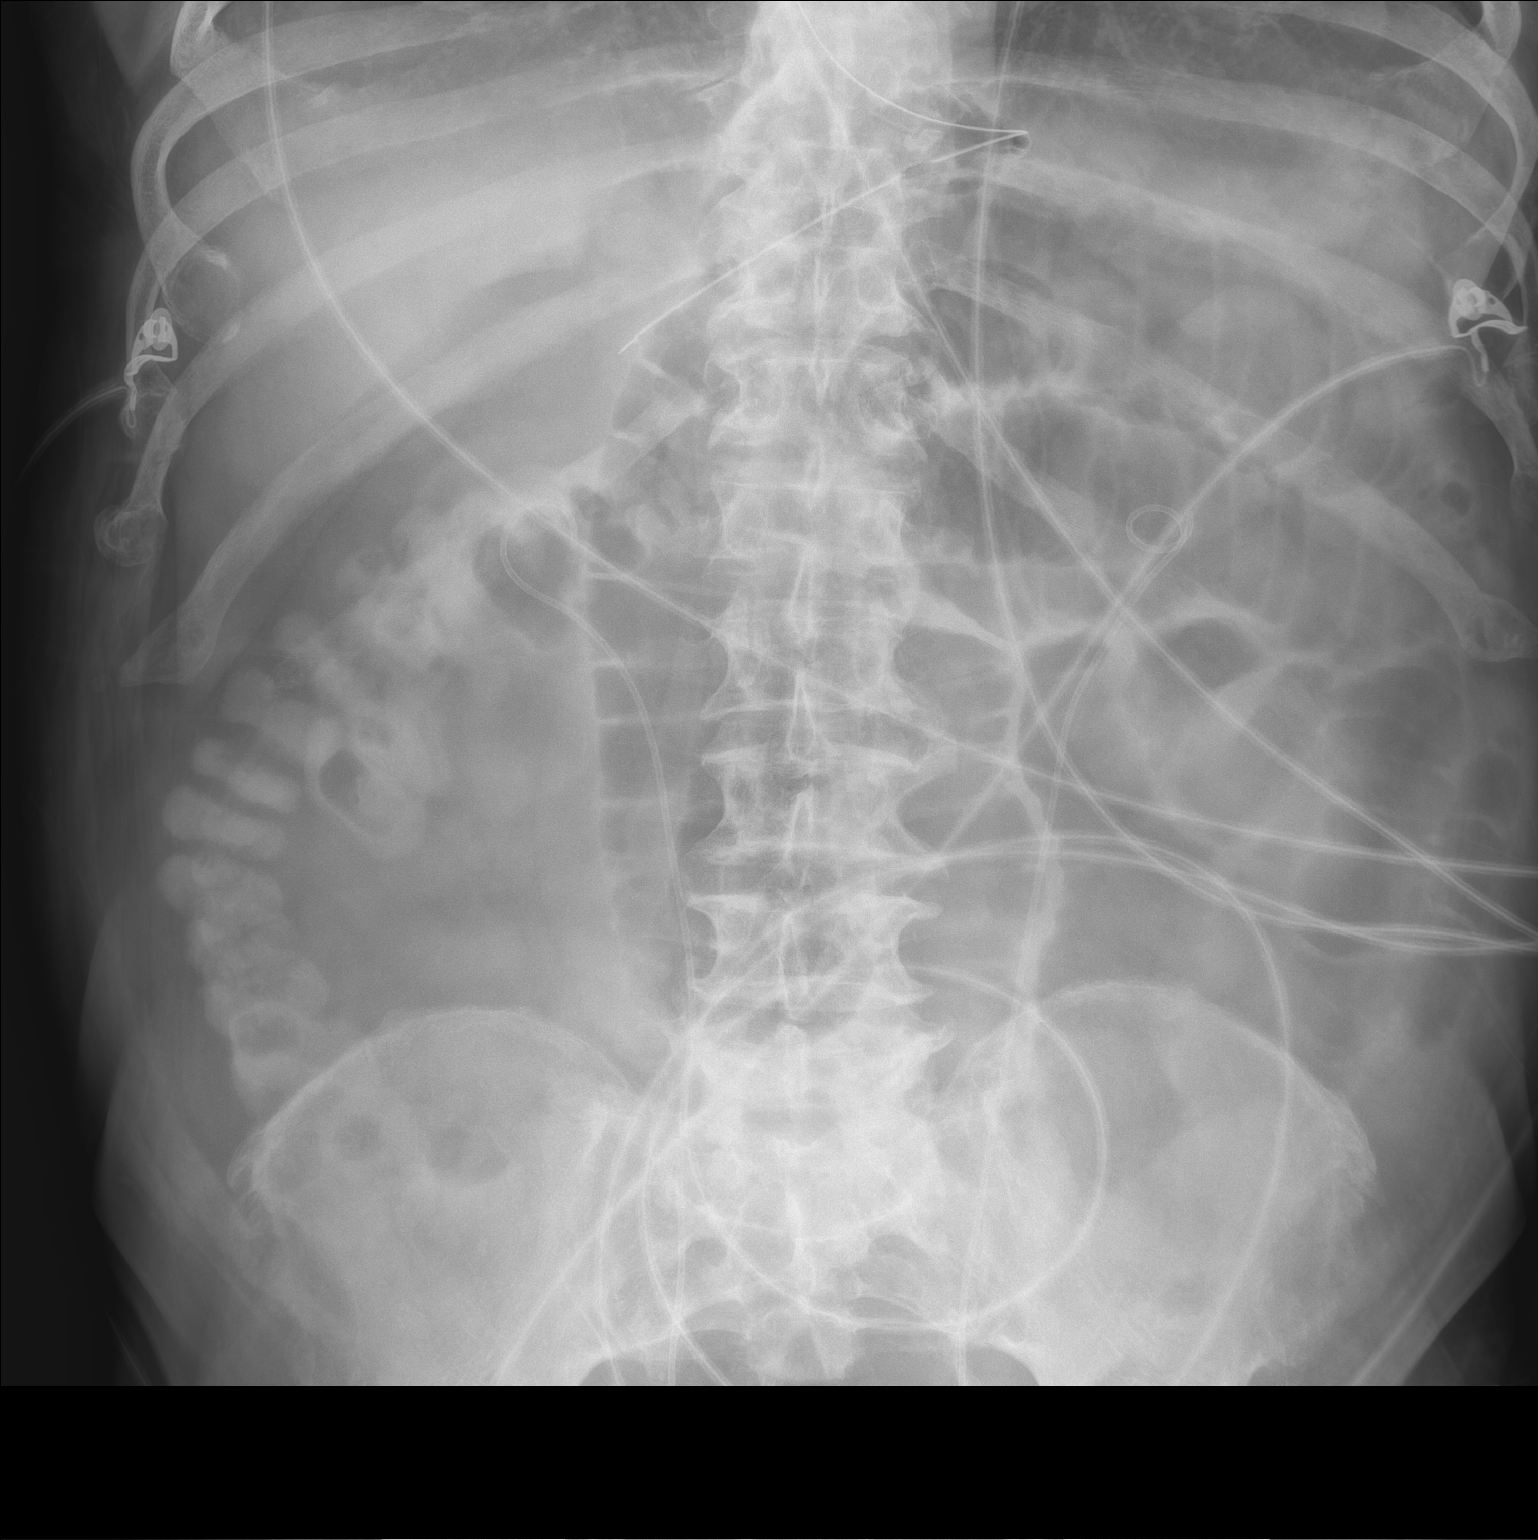

[2 of 2 positions shown; findings below may reference images not displayed]

FINDINGS: Indwelling enteric tube with tip and side hole overlying the gastric
body. Increased conspicuity of gas-filled mildly dilated small bowel
loops. Residual enteric contrast opacifies nondilated ascending
colon.

Bilateral double-J ureteral stents are unchanged. Sequela of
bilateral hip arthroplasties. Partially imaged left hip surgical
drain.
IMPRESSION: Mildly dilated small bowel loops, more conspicuous than prior exam.

Indwelling enteric tube and bilateral ureteral stents, unchanged.

## 2022-03-31 ENCOUNTER — Other Ambulatory Visit: Payer: Self-pay | Admitting: Family Medicine

## 2022-04-19 ENCOUNTER — Encounter: Payer: Self-pay | Admitting: Endocrinology

## 2022-04-19 ENCOUNTER — Ambulatory Visit (INDEPENDENT_AMBULATORY_CARE_PROVIDER_SITE_OTHER): Payer: PPO | Admitting: Endocrinology

## 2022-04-19 VITALS — BP 146/68 | HR 61 | Ht 74.0 in | Wt 230.0 lb

## 2022-04-19 DIAGNOSIS — I10 Essential (primary) hypertension: Secondary | ICD-10-CM

## 2022-04-19 DIAGNOSIS — E669 Obesity, unspecified: Secondary | ICD-10-CM

## 2022-04-19 DIAGNOSIS — E1169 Type 2 diabetes mellitus with other specified complication: Secondary | ICD-10-CM

## 2022-04-19 DIAGNOSIS — E782 Mixed hyperlipidemia: Secondary | ICD-10-CM | POA: Diagnosis not present

## 2022-04-19 LAB — POCT GLYCOSYLATED HEMOGLOBIN (HGB A1C): Hemoglobin A1C: 6.1 % — AB (ref 4.0–5.6)

## 2022-04-19 NOTE — Progress Notes (Signed)
Patient ID: [name], male   DOB: 10-22-1945, 77 y.o.   MRN: [id number]           Reason for Appointment: Type II Diabetes follow-up   History of Present Illness   Diagnosis date: 2001  Previous history:  He was previously on metformin which was stopped because of renal dysfunction Amaryl was added in 2013 starting at 4 mg daily Januvia was started in 2018 His A1c has ranged between 5.9 and 9.3 but generally lower since 5/22  Recent history:     Non-insulin hypoglycemic drugs: Januvia 50 mg daily, Prandin 0.5 mg 2 times daily,  Not on WelChol 1250 mg daily    Side effects from medications: None  Current self management, blood sugar patterns and problems identified:  A1c is 6.1, unchanged He is continuing to use a CGM with freestyle libre 3  Blood sugars are extremely stable for the day with average in the low 100s and no postprandial hyperglycemia as well as likely no hypoglycemia also from current data  He ran out of WelChol a month ago but was only taking 2 tablets a day and blood sugars are looking stable On his previous visit was told to stop Prandin at lunch to avoid low sugars, usually eating smaller meal at lunch As before is not able to do much physical activity because of leg pain and other issues  He has seen the dietitian in 9/23 but has not been able to lose any weight  Previously was not able to afford Januvia but able to do so now, does not appear to have taken Rybelsus   Nutrition counseling: 9/23  Exercise: limited      Monitors blood glucose: With freestyle libre 3  Interpretation of his freestyle [employer] is as follows  Blood sugars are very consistent throughout the day Average blood sugar lowest 112 overnight and 128 after breakfast and 126 late afternoon Overall AVERAGE 119 Time in range 100%  Previous data:  CGM use % of time   2-week average/GV 102  Time in range        99%  % Time Above 180   % Time above 250   % Time Below  70 1    Weight control:  Wt Readings from Last 3 Encounters:  04/19/22 230 lb (104.3 kg)  12/17/21 229 lb (103.9 kg)  12/15/21 230 lb (104.3 kg)           Diabetes labs:  Lab Results  Component Value Date   HGBA1C 6.1 (A) 04/19/2022   HGBA1C 6.2 12/10/2021   HGBA1C 6.5 (A) 09/14/2021   Lab Results  Component Value Date   MICROALBUR 15.0 (H) 12/10/2021   LDLCALC 54 12/10/2021   CREATININE 1.63 (H) 12/10/2021     Allergies as of 04/19/2022       Reactions   Shellfish Allergy Rash        Medication List        Accurate as of April 19, 2022  3:03 PM. If you have any questions, ask your nurse or doctor.          albuterol 108 (90 Base) MCG/ACT inhaler Commonly known as: VENTOLIN HFA Inhale 2 puffs into the lungs every 6 (six) hours as needed for wheezing or shortness of breath.   allopurinol 100 MG tablet Commonly known as: ZYLOPRIM TAKE 1/2 TABLET BY MOUTH EVERY DAY   amiodarone 200 MG tablet Commonly known as: PACERONE Take 200 mg by mouth daily. Dr.  Harwani   amLODipine 10 MG tablet Commonly known as: NORVASC TAKE 1 TABLET BY MOUTH EVERY DAY   aspirin 325 MG tablet Take by mouth.   atorvastatin 40 MG tablet Commonly known as: LIPITOR TAKE 1 TABLET BY MOUTH EVERY DAY FOR HYPERLIPIDEMIA   clotrimazole-betamethasone cream Commonly known as: LOTRISONE APPLY 1 APPLICATION TOPICALLY DAILY   co-enzyme Q-10 30 MG capsule Take by mouth.   colchicine 0.6 MG tablet TAKE 1 TABLET BY MOUTH EVERY DAY   colesevelam 625 MG tablet Commonly known as: WELCHOL TAKE 2 TABLETS (1,250 MG TOTAL) BY MOUTH DAILY.   cycloSPORINE 0.05 % ophthalmic emulsion Commonly known as: RESTASIS Place 1 drop into both eyes 2 (two) times daily.   docusate sodium 100 MG capsule Commonly known as: COLACE Take 100 mg by mouth daily as needed for mild constipation.   ferrous sulfate 325 (65 FE) MG tablet Take 325 mg by mouth daily with breakfast.   FreeStyle Libre 3  Sensor Misc 1 DEVICE BY DOES NOT APPLY ROUTE EVERY 14 (FOURTEEN) DAYS. PLACE 1 SENSOR ON THE SKIN EVERY 14 DAYS. USE TO CHECK GLUCOSE CONTINUOUSLY   gabapentin 300 MG capsule Commonly known as: NEURONTIN Take 1 capsule by mouth 2 (two) times daily.   hydrALAZINE 10 MG tablet Commonly known as: APRESOLINE TAKE 2 TABLETS BY MOUTH 3 TIMES A DAY   hydrochlorothiazide 25 MG tablet Commonly known as: HYDRODIURIL   hydrOXYzine 25 MG capsule Commonly known as: VISTARIL TAKE 1 CAPSULE BY MOUTH 3 TIMES DAILY AS NEEDED FOR ITCHING.   Klor-Con M10 10 MEQ tablet Generic drug: potassium chloride   levofloxacin 500 MG tablet Commonly known as: LEVAQUIN Take 1 tablet by mouth daily.   metoprolol succinate 50 MG 24 hr tablet Commonly known as: TOPROL-XL Take 50 mg by mouth daily.   omeprazole 40 MG capsule Commonly known as: PRILOSEC TAKE 1 CAPSULE BY MOUTH EVERY DAY IN THE MORNING AND AT BEDTIME   polyethylene glycol 17 g packet Commonly known as: MIRALAX / GLYCOLAX Take 17 g by mouth daily.   repaglinide 0.5 MG tablet Commonly known as: PRANDIN TAKE 1 TABLET (0.5 MG TOTAL) BY MOUTH 3 (THREE) TIMES DAILY BEFORE MEALS.   sitaGLIPtin 50 MG tablet Commonly known as: JANUVIA Take 1 tablet (50 mg total) by mouth daily.   spironolactone 25 MG tablet Commonly known as: ALDACTONE TAKE 1 TABLET BY MOUTH EVERY DAY AS NEEDED FOR EDEMA   torsemide 20 MG tablet Commonly known as: DEMADEX        Allergies:  Allergies  Allergen Reactions   Shellfish Allergy Rash    Past Medical History:  Diagnosis Date   Arthritis    Asthma    no inhaler   Atrial fibrillation ([place]) 10/16/2020   Coronary artery disease    cardiologist-  dr spruill; last visit 3 mos ago per pt   Enterobacter sepsis ([place]) 04/13/2020   GERD (gastroesophageal reflux disease)    History of MI (myocardial infarction)    1985   Hydronephrosis, left    Hypertension    Myocardial infarction [facility])    Nephrolithiasis     left   Neuromuscular disorder ([facility])    TINGLING IN BOTH HANDS   Presence of tooth-root and mandibular implants    lower dental implants   Prostate cancer ([place])    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   QT prolongation 10/16/2020   Shortness of breath    WITH EXERTION   Thigh abscess 04/13/2020   Type 2  diabetes mellitus ([place])    Zoster 09/13/2021    Past Surgical History:  Procedure Laterality Date   BUNIONECTOMY     CYSTOSCOPY W/ RETROGRADES Left 03/14/2014   Procedure: CYSTOSCOPY WITH LEFT RETROGRADE PYELOGRAM, Left Ureteroscopy, [place] ureteral Stent No string;  Surgeon: [name], MD;  Location: [facility];  Service: Urology;  Laterality: Left;   CYSTOSCOPY W/ URETERAL STENT PLACEMENT Bilateral 01/15/2020   Procedure: CYSTOSCOPY WITH RETROGRADE PYELOGRAM/URETERAL STENT PLACEMENT;  Surgeon: Alexis Frock, MD;  Location: [facility];  Service: Urology;  Laterality: Bilateral;   CYSTOSCOPY/URETEROSCOPY/HOLMIUM LASER/STENT PLACEMENT Bilateral 03/17/2020   Procedure: CYSTOSCOPY/URETEROSCOPY/HOLMIUM LASER/STENT LEFT PLACEMENT;  Surgeon: Festus Aloe, MD;  Location: [facility];  Service: Urology;  Laterality: Bilateral;   HERNIA REPAIR     INCISION AND DRAINAGE Left 03/09/2020   Procedure: INCISION AND DRAINAGE LEFT HIP WITH LINER EXCHANGE;  Surgeon: Gaynelle Arabian, MD;  Location: [facility];  Service: Orthopedics;  Laterality: Left;   IR FLUORO GUIDE CV LINE RIGHT  03/10/2020   IR RADIOLOGIST EVAL & MGMT  04/01/2020   IR RADIOLOGIST EVAL & MGMT  04/15/2020   IR RADIOLOGIST EVAL & MGMT  04/30/2020   IR REMOVAL TUN CV CATH W/O FL  04/22/2020   IR US GUIDE BX ASP/DRAIN  03/17/2020   IR US GUIDE VASC ACCESS RIGHT  03/10/2020   PROSTATE BIOPSY     TOTAL HIP ARTHROPLASTY Left 03-20-2009   TOTAL HIP ARTHROPLASTY  04/09/2012   Procedure: TOTAL HIP ARTHROPLASTY;  Surgeon: [name], MD;  Location: [facility];  Service: Orthopedics;  Laterality: Right;   TOTAL KNEE ARTHROPLASTY Left  05-16-2008    Family History  Problem Relation Age of Onset   Cancer Mother        breast   Multiple sclerosis Daughter    Cancer Father        prostate   Diabetes Father    Cancer Maternal Uncle        bone    Social History:  reports that he quit smoking about 38 years ago. His smoking use included cigarettes. He has a 25.00 pack-year smoking history. He quit smokeless tobacco use about 37 years ago. He reports that he does not currently use alcohol. He reports that he does not use drugs.  Review of Systems:  Last diabetic eye exam date 09/2000  Last foot exam date: 8/22 He has symptoms of burning and pain in his feet not always relieved by gabapentin He sometimes takes Tylenol His PCP had prescribed Lyrica but he did not apparently tolerate this  Hypertension: His medications include 25 mg losartan, off amlodipine for the last month or so and has not had a renewal Blood pressure was better at the second attempt Also at home his blood pressure in the 875 systolic this morning  BP Readings from Last 3 Encounters:  04/19/22 (!) 146/68  12/15/21 138/70  12/03/21 (!) 160/71   Lab Results  Component Value Date   CREATININE 1.63 (H) 12/10/2021   CREATININE 1.70 (H) 10/26/2021   CREATININE 1.80 (H) 09/13/2021    Lipids: He has been managed with 40 mg rosuvastatin and WelChol followed by cardiology     Lab Results  Component Value Date   CHOL 121 12/10/2021   CHOL 159 08/12/2020   CHOL 223 (H) 05/18/2020   Lab Results  Component Value Date   HDL 37.40 (L) 12/10/2021   HDL 41 08/12/2020   HDL 45 05/18/2020   Lab Results  Component  Value Date   LDLCALC 54 12/10/2021   LDLCALC 97 08/12/2020   LDLCALC 146 (H) 05/18/2020   Lab Results  Component Value Date   TRIG 146.0 12/10/2021   TRIG 110 08/12/2020   TRIG 182 (H) 05/18/2020   Lab Results  Component Value Date   CHOLHDL 3 12/10/2021   CHOLHDL 5.0 (H) 05/18/2020   CHOLHDL 5 10/23/2018   Lab Results   Component Value Date   LDLDIRECT 132 (H) 10/24/2019      Examination:   BP (!) 146/68   Pulse 61   Ht '6\' 2"'$  (1.88 m)   Wt 230 lb (104.3 kg)   SpO2 100%   BMI 29.53 kg/m   Body mass index is 29.53 kg/m.    ASSESSMENT/ PLAN:    Diabetes type 2 on oral agents:   Current regimen: Prandin, Januvia 50 mg  A1c is 6.2 with recent CGM indicating an average of 119 and 100% within target range   His blood sugars are excellent as judged by his freestyle libre sensor Lab glucose not recently checked Discussed that previous prescription of WelChol 2 tablets daily was subtherapeutic and likely does not need to continue this, also previously lipids were well-controlled otherwise  He will follow-up in about 4 months with a new endocrinologist in the practice Chronic kidney disease: Followed by nephrology  HYPERTENSION: Blood pressure is not controlled and needs to discuss renewal of his amlodipine with his PCP and regular follow-up   Patient Instructions  Stop Welchol  Restart amlodipine   Elayne Snare 04/19/2022, 3:03 PM

## 2022-04-19 NOTE — Patient Instructions (Addendum)
Stop Welchol  Restart amlodipine

## 2022-04-27 NOTE — Progress Notes (Signed)
Subjective:  Chief complaint: Left prosthetic hip infection   Patient ID: [name], male    DOB: 1945/05/02, 77 y.o.   MRN: [id number]  HPI  77 year old Black man with PMHx of CAD, DM2, HTN, CKD, OA, s/p left hip arthoplasty (2001), prostate cancer, PAF, left ureteral stone s/p stenting (01/2020) who was sent from ortho clinic on 11/23 by Dr Wynelle Link. He grew Enterobacter from blood, urine and hip aspirate. He is sp surgery with Irrigation and debridement, left hip, with bearing surface exchange. Enterobacter grew on cultures.  We had him scheduled to complete antibiotics on 10 January.  In the interim he developed a fluid collection in his thigh that was drained and seemed rather bloody but which also grew Enterobacter from culture.   He finished cefepime and we started bactrim DS BID he ran into problems with hyperkalemia and elevated creatinine also in the context of other potassium sparing and potassium raising medications   We obtained Nuzyra PA though due to his deductible not having yet been met he would have to pay roughly $2k upfront.   He ended up doing that but then turned out that he need to pay $640 a month even though he had met that amount.   I then took [name] off he [name] because clinically seem d to be doing well and his CRP had normalized though his sed rate had not normalized I did come down from 140 into the 90s.  I also felt like continue him on this tetracycline would just be to cost prohibitive.  Unfortunately since he came off antibiotics he experienced recurrence of infection at his hip with increasing pain and also pus which began coming through the skin.  He was seen by Dr.  [name]  who aspirated the area and sent for culture. Exact same Enterobacter grew yet again from culture in June of 2022.  After careful consideration we decided to start levofloxacin despite risk of C. difficile colitis, Achilles tendinopathy confusion problems with blood sugar  control and QT prolongation in the context of him being on amiodarone and having slight QT prolongation at baseline.  Since starting levofloxacin he to be having no adverse effects from this.  He returned to clinic and we obtained a twelve-lead EKG.  If EKG shows normal sinus rhythm with heart first-degree AV block but no significant ST or T wave changes and a QT and QTc of 429 and 451 which was largely unchanged from his last EKG obtained.  Last time I saw [name] is having some pruritus that he did to the levofloxacin.  He continues to have this but he is able to tolerate it.  He has seen Dr. Loanne Drilling for his diabetes mellitus.  I was told by triage staff that his levofloxacin had been discontinued possibly by Dr. Loanne Drilling but I do not see mention of this in his note indeed he was not off levofloxacin.  His pain has been stable and seems to have been improved.  He did have a flare of zoster recently which caused him to have pain in his left lower extremity and also up in his hip.  This is improved however.  He does still have itching that is worse when he goes to bed or when he is "winding down.  He previously very much would like to come off the antibiotics would like to push through 1 year from when it recurred which would be in July.  Returns to clinic now in July  a year later.  We had further discussions about continuing antibiotics versus stopping them I think in my opinion it be prudent to continue him on them for now and revisit this in 6 months because I have a high suspicion he will have recurrence once he comes off.  [name] is appears to be doing relatively well today his hip pain is stable he really only has it when he sits in his recliner and sounds when he gets up in the middle the night to go to the bathroom.  He had a right-sided knee effusion that continues to bother him but has responded to treatment with aspiration and injection of corticosteroids with Dr. [name].  He did  have an episode of transient confusion when he was driving his car and lost his way in [place] but I do not think this is related to levofloxacin because it was transient     Past Medical History:  Diagnosis Date   Arthritis    Asthma    no inhaler   Atrial fibrillation ([place]) 10/16/2020   Coronary artery disease    cardiologist-  dr spruill; last visit 3 mos ago per pt   Enterobacter sepsis ([place]) 04/13/2020   GERD (gastroesophageal reflux disease)    History of MI (myocardial infarction)    1985   Hydronephrosis, left    Hypertension    Myocardial infarction ([place])    Nephrolithiasis    left   Neuromuscular disorder ([place])    TINGLING IN BOTH HANDS   Presence of tooth-root and mandibular implants    lower dental implants   Prostate cancer ([place])    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   QT prolongation 10/16/2020   Shortness of breath    WITH EXERTION   Thigh abscess 04/13/2020   Type 2 diabetes mellitus ([place])    Zoster 09/13/2021    Past Surgical History:  Procedure Laterality Date   BUNIONECTOMY     CYSTOSCOPY W/ RETROGRADES Left 03/14/2014   Procedure: CYSTOSCOPY WITH LEFT RETROGRADE PYELOGRAM, Left Ureteroscopy, Lweft ureteral Stent No string;  Surgeon: [name], MD;  Location: [facility];  Service: Urology;  Laterality: Left;   CYSTOSCOPY W/ URETERAL STENT PLACEMENT Bilateral 01/15/2020   Procedure: CYSTOSCOPY WITH RETROGRADE PYELOGRAM/URETERAL STENT PLACEMENT;  Surgeon: Alexis Frock, MD;  [facility];  Service: Urology;  Laterality: Bilateral;   CYSTOSCOPY/URETEROSCOPY/HOLMIUM LASER/STENT PLACEMENT Bilateral 03/17/2020   Procedure: CYSTOSCOPY/URETEROSCOPY/HOLMIUM LASER/STENT LEFT PLACEMENT;  Surgeon: Festus Aloe, MD;  [facility];  Service: Urology;  Laterality: Bilateral;   HERNIA REPAIR     INCISION AND DRAINAGE Left 03/09/2020   Procedure: INCISION AND DRAINAGE LEFT HIP WITH LINER EXCHANGE;  Surgeon: Gaynelle Arabian, MD;  [facility];  Service: Orthopedics;  Laterality: Left;   IR FLUORO GUIDE CV LINE RIGHT  03/10/2020   IR RADIOLOGIST EVAL & MGMT  04/01/2020   IR RADIOLOGIST EVAL & MGMT  04/15/2020   IR RADIOLOGIST EVAL & MGMT  04/30/2020   IR REMOVAL TUN CV CATH W/O FL  04/22/2020   IR US GUIDE BX ASP/DRAIN  03/17/2020   IR US GUIDE VASC ACCESS RIGHT  03/10/2020   PROSTATE BIOPSY     TOTAL HIP ARTHROPLASTY Left 03-20-2009   TOTAL HIP ARTHROPLASTY  04/09/2012   Procedure: TOTAL HIP ARTHROPLASTY;  Surgeon: [name], MD;  [facility];  Service: Orthopedics;  Laterality: Right;   TOTAL KNEE ARTHROPLASTY Left 05-16-2008    Family History  Problem Relation Age of  Onset   Cancer Mother        breast   Multiple sclerosis Daughter    Cancer Father        prostate   Diabetes Father    Cancer Maternal Uncle        bone      Social History   Socioeconomic History   Marital status: Widowed    Spouse name: Not on file   Number of children: 4   Years of education: 9   Highest education level: 9th grade  Occupational History   Occupation: Retired  Tobacco Use   Smoking status: Former    Packs/day: 1.00    Years: 25.00    Total pack years: 25.00    Types: Cigarettes    Quit date: 04/11/1984    Years since quitting: 38.0   Smokeless tobacco: Former    Quit date: 04/02/1985  Vaping Use   Vaping Use: Never used  Substance and Sexual Activity   Alcohol use: Not Currently    Comment: RARE   Drug use: No   Sexual activity: Not Currently  Other Topics Concern   Not on file  Social History Narrative   Lives alone. He had four children and one has deceased. He enjoys watching television.   Social Determinants of Health   Financial Resource Strain: Low Risk  (09/21/2021)   Overall Financial Resource Strain (CARDIA)    Difficulty of Paying Living Expenses: Not hard at all  Food Insecurity: No Food Insecurity (09/21/2021)   Hunger Vital Sign    Worried About Running Out of Food in the Last Year:  Never true    Ran Out of Food in the Last Year: Never true  Transportation Needs: No Transportation Needs (09/21/2021)   PRAPARE - Hydrologist (Medical): No    Lack of Transportation (Non-Medical): No  Physical Activity: Sufficiently Active (09/21/2021)   Exercise Vital Sign    Days of Exercise per Week: 5 days    Minutes of Exercise per Session: 40 min  Stress: No Stress Concern Present (09/21/2021)   [place]    Feeling of Stress : Not at all  Social Connections: Moderately Isolated (09/21/2021)   Social Connection and Isolation Panel [NHANES]    Frequency of Communication with Friends and Family: More than three times a week    Frequency of Social Gatherings with Friends and Family: Twice a week    Attends Religious Services: More than 4 times per year    Active Member of [employer] or Organizations: No    Attends Archivist Meetings: Never    Marital Status: Widowed    Allergies  Allergen Reactions   Shellfish Allergy Rash     Current Outpatient Medications:    albuterol (VENTOLIN HFA) 108 (90 Base) MCG/ACT inhaler, Inhale 2 puffs into the lungs every 6 (six) hours as needed for wheezing or shortness of breath., Disp: 8 g, Rfl: 0   allopurinol (ZYLOPRIM) 100 MG tablet, TAKE 1/2 TABLET BY MOUTH EVERY DAY, Disp: 45 tablet, Rfl: 1   amiodarone (PACERONE) 200 MG tablet, Take 200 mg by mouth daily. Dr. [name], Disp: , Rfl:    amLODipine (NORVASC) 10 MG tablet, TAKE 1 TABLET BY MOUTH EVERY DAY, Disp: 90 tablet, Rfl: 1   aspirin 325 MG tablet, Take by mouth., Disp: , Rfl:    atorvastatin (LIPITOR) 40 MG tablet, TAKE 1 TABLET BY MOUTH EVERY DAY FOR HYPERLIPIDEMIA,  Disp: , Rfl:    clotrimazole-betamethasone (LOTRISONE) cream, APPLY 1 APPLICATION TOPICALLY DAILY, Disp: 30 g, Rfl: 0   co-enzyme Q-10 30 MG capsule, Take by mouth., Disp: , Rfl:    colchicine 0.6 MG tablet, TAKE 1 TABLET BY  MOUTH EVERY DAY, Disp: 90 tablet, Rfl: 1   colesevelam (WELCHOL) 625 MG tablet, TAKE 2 TABLETS (1,250 MG TOTAL) BY MOUTH DAILY., Disp: 180 tablet, Rfl: 3   Continuous Blood Gluc Sensor (FREESTYLE LIBRE 3 SENSOR) MISC, 1 DEVICE BY DOES NOT APPLY ROUTE EVERY 14 (FOURTEEN) DAYS. PLACE 1 SENSOR ON THE SKIN EVERY 14 DAYS. USE TO CHECK GLUCOSE CONTINUOUSLY, Disp: 2 each, Rfl: 3   cycloSPORINE (RESTASIS) 0.05 % ophthalmic emulsion, Place 1 drop into both eyes 2 (two) times daily., Disp: , Rfl:    docusate sodium (COLACE) 100 MG capsule, Take 100 mg by mouth daily as needed for mild constipation., Disp: , Rfl:    ferrous sulfate 325 (65 FE) MG tablet, Take 325 mg by mouth daily with breakfast., Disp: , Rfl:    gabapentin (NEURONTIN) 300 MG capsule, Take 1 capsule by mouth 2 (two) times daily., Disp: , Rfl:    hydrALAZINE (APRESOLINE) 10 MG tablet, TAKE 2 TABLETS BY MOUTH 3 TIMES A DAY, Disp: 540 tablet, Rfl: 0   hydrochlorothiazide (HYDRODIURIL) 25 MG tablet, , Disp: , Rfl:    hydrOXYzine (VISTARIL) 25 MG capsule, TAKE 1 CAPSULE BY MOUTH 3 TIMES DAILY AS NEEDED FOR ITCHING., Disp: , Rfl:    levofloxacin (LEVAQUIN) 500 MG tablet, Take 1 tablet by mouth daily., Disp: 30 tablet, Rfl: 11   metoprolol succinate (TOPROL-XL) 50 MG 24 hr tablet, Take 50 mg by mouth daily., Disp: , Rfl:    omeprazole (PRILOSEC) 40 MG capsule, TAKE 1 CAPSULE BY MOUTH EVERY DAY IN THE MORNING AND AT BEDTIME, Disp: 180 capsule, Rfl: 0   polyethylene glycol (MIRALAX / GLYCOLAX) 17 g packet, Take 17 g by mouth daily., Disp: , Rfl:    potassium chloride (KLOR-CON M10) 10 MEQ tablet, , Disp: , Rfl:    repaglinide (PRANDIN) 0.5 MG tablet, TAKE 1 TABLET (0.5 MG TOTAL) BY MOUTH 3 (THREE) TIMES DAILY BEFORE MEALS., Disp: 270 tablet, Rfl: 1   sitaGLIPtin (JANUVIA) 50 MG tablet, Take 1 tablet (50 mg total) by mouth daily., Disp: 30 tablet, Rfl: 0   spironolactone (ALDACTONE) 25 MG tablet, TAKE 1 TABLET BY MOUTH EVERY DAY AS NEEDED FOR EDEMA,  Disp: 90 tablet, Rfl: 1   torsemide (DEMADEX) 20 MG tablet, , Disp: , Rfl:    Review of Systems  Constitutional:  Negative for activity change, appetite change, chills, diaphoresis, fatigue, fever and unexpected weight change.  HENT:  Negative for congestion, rhinorrhea, sinus pressure, sneezing, sore throat and trouble swallowing.   Eyes:  Negative for photophobia and visual disturbance.  Respiratory:  Negative for cough, chest tightness, shortness of breath, wheezing and stridor.   Cardiovascular:  Negative for chest pain, palpitations and leg swelling.  Gastrointestinal:  Negative for abdominal distention, abdominal pain, anal bleeding, blood in stool, constipation, diarrhea, nausea and vomiting.  Genitourinary:  Negative for difficulty urinating, dysuria, flank pain and hematuria.  Musculoskeletal:  Positive for arthralgias. Negative for back pain, gait problem, joint swelling and myalgias.  Skin:  Negative for color change, pallor, rash and wound.  Neurological:  Negative for dizziness, tremors, weakness and light-headedness.  Hematological:  Negative for adenopathy. Does not bruise/bleed easily.  Psychiatric/Behavioral:  Negative for agitation, behavioral problems, confusion, decreased concentration, dysphoric mood  and sleep disturbance.        Objective:   Physical Exam Constitutional:      Appearance: He is well-developed.  HENT:     Head: Normocephalic and atraumatic.  Eyes:     Conjunctiva/sclera: Conjunctivae normal.  Cardiovascular:     Rate and Rhythm: Normal rate and regular rhythm.  Pulmonary:     Effort: Pulmonary effort is normal. No respiratory distress.     Breath sounds: No wheezing.  Abdominal:     General: There is no distension.     Palpations: Abdomen is soft.  Musculoskeletal:        General: No tenderness. Normal range of motion.     Cervical back: Normal range of motion and neck supple.  Skin:    General: Skin is warm and dry.     Coloration: Skin  is not pale.     Findings: No erythema or rash.  Neurological:     General: No focal deficit present.     Mental Status: He is alert and oriented to person, place, and time.  Psychiatric:        Mood and Affect: Mood normal.        Behavior: Behavior normal.        Thought Content: Thought content normal.        Judgment: Judgment normal.            Assessment & Plan:   Prosthetic joint infection  I will sed rate CRP BMP with GFR CBC differential  I am continue his levofloxacin prescription   QT prolongation and risk of this with fluoroquinolone and amiodarone noted but QT has been stable  Chronic kidney disease following up check BMP  Hypertension: Poorly controlled in clinic.   Vaccine counseling: Recommended RSV vaccine he has had the most recent COVID-19 and flu shot that was not in our system.

## 2022-04-28 ENCOUNTER — Telehealth: Payer: Self-pay

## 2022-04-28 ENCOUNTER — Ambulatory Visit: Payer: PPO | Admitting: Infectious Disease

## 2022-04-28 ENCOUNTER — Other Ambulatory Visit: Payer: Self-pay

## 2022-04-28 ENCOUNTER — Encounter: Payer: Self-pay | Admitting: Infectious Disease

## 2022-04-28 VITALS — BP 158/62 | HR 77 | Resp 16 | Ht 74.0 in | Wt 230.0 lb

## 2022-04-28 DIAGNOSIS — I159 Secondary hypertension, unspecified: Secondary | ICD-10-CM | POA: Diagnosis not present

## 2022-04-28 DIAGNOSIS — N1832 Chronic kidney disease, stage 3b: Secondary | ICD-10-CM | POA: Diagnosis not present

## 2022-04-28 DIAGNOSIS — Z87891 Personal history of nicotine dependence: Secondary | ICD-10-CM

## 2022-04-28 DIAGNOSIS — T8459XD Infection and inflammatory reaction due to other internal joint prosthesis, subsequent encounter: Secondary | ICD-10-CM

## 2022-04-28 DIAGNOSIS — I2510 Atherosclerotic heart disease of native coronary artery without angina pectoris: Secondary | ICD-10-CM

## 2022-04-28 DIAGNOSIS — A4159 Other Gram-negative sepsis: Secondary | ICD-10-CM | POA: Diagnosis not present

## 2022-04-28 DIAGNOSIS — I4891 Unspecified atrial fibrillation: Secondary | ICD-10-CM

## 2022-04-28 DIAGNOSIS — Z7185 Encounter for immunization safety counseling: Secondary | ICD-10-CM

## 2022-04-28 DIAGNOSIS — Z96649 Presence of unspecified artificial hip joint: Secondary | ICD-10-CM | POA: Diagnosis not present

## 2022-04-28 HISTORY — DX: Encounter for immunization safety counseling: Z71.85

## 2022-04-28 MED ORDER — LEVOFLOXACIN 500 MG PO TABS: 500.0000 mg | ORAL_TABLET | Freq: Every day | ORAL | 11 refills | Status: DC

## 2022-04-28 NOTE — Telephone Encounter (Signed)
Contacted patient and scheduled an appointment on 05/03/2022 at 1:30pm with Dr. Zigmund Daniel.

## 2022-04-28 NOTE — Patient Instructions (Signed)
[name] get an RSV vaccine at your pharmacy

## 2022-04-28 NOTE — Telephone Encounter (Signed)
Pt lvm stating Dr. Tommy Medal was concerned about his blood pressure readings: 173/64 & 168/63.  Please contact the patient to schedule office visit for hypertension. Thanks

## 2022-04-29 LAB — CBC WITH DIFFERENTIAL/PLATELET
Absolute Monocytes: 533 cells/uL (ref 200–950)
Basophils Absolute: 19 cells/uL (ref 0–200)
Basophils Relative: 0.5 %
Eosinophils Absolute: 137 cells/uL (ref 15–500)
Eosinophils Relative: 3.7 %
HCT: 29.4 % — ABNORMAL LOW (ref 38.5–50.0)
Hemoglobin: 9.9 g/dL — ABNORMAL LOW (ref 13.2–17.1)
Lymphs Abs: 1169 cells/uL (ref 850–3900)
MCH: 33.2 pg — ABNORMAL HIGH (ref 27.0–33.0)
MCHC: 33.7 g/dL (ref 32.0–36.0)
MCV: 98.7 fL (ref 80.0–100.0)
MPV: 9.4 fL (ref 7.5–12.5)
Monocytes Relative: 14.4 %
Neutro Abs: 1843 cells/uL (ref 1500–7800)
Neutrophils Relative %: 49.8 %
Platelets: 272 10*3/uL (ref 140–400)
RBC: 2.98 10*6/uL — ABNORMAL LOW (ref 4.20–5.80)
RDW: 13.1 % (ref 11.0–15.0)
Total Lymphocyte: 31.6 %
WBC: 3.7 10*3/uL — ABNORMAL LOW (ref 3.8–10.8)

## 2022-04-29 LAB — BASIC METABOLIC PANEL WITH GFR
BUN/Creatinine Ratio: 20 (calc) (ref 6–22)
BUN: 32 mg/dL — ABNORMAL HIGH (ref 7–25)
CO2: 19 mmol/L — ABNORMAL LOW (ref 20–32)
Calcium: 9 mg/dL (ref 8.6–10.3)
Chloride: 112 mmol/L — ABNORMAL HIGH (ref 98–110)
Creat: 1.59 mg/dL — ABNORMAL HIGH (ref 0.70–1.28)
Glucose, Bld: 95 mg/dL (ref 65–99)
Potassium: 4.8 mmol/L (ref 3.5–5.3)
Sodium: 141 mmol/L (ref 135–146)
eGFR: 45 mL/min/{1.73_m2} — ABNORMAL LOW (ref >=60)

## 2022-04-29 LAB — C-REACTIVE PROTEIN: CRP: 0.8 mg/L (ref <8.0)

## 2022-04-29 LAB — SEDIMENTATION RATE: Sed Rate: 33 mm/h — ABNORMAL HIGH (ref 0–20)

## 2022-05-03 ENCOUNTER — Encounter: Payer: Self-pay | Admitting: Family Medicine

## 2022-05-03 ENCOUNTER — Ambulatory Visit (INDEPENDENT_AMBULATORY_CARE_PROVIDER_SITE_OTHER): Payer: PPO | Admitting: Family Medicine

## 2022-05-03 VITALS — BP 155/61 | HR 58 | Ht 74.0 in | Wt 232.0 lb

## 2022-05-03 DIAGNOSIS — I1A0 Resistant hypertension: Secondary | ICD-10-CM

## 2022-05-03 DIAGNOSIS — M1990 Unspecified osteoarthritis, unspecified site: Secondary | ICD-10-CM

## 2022-05-03 NOTE — Assessment & Plan Note (Signed)
Blood pressure remains elevated.  Recheck was a little better.  Referring to advanced hypertension clinic.  Pain may be contributing as well.

## 2022-05-03 NOTE — Assessment & Plan Note (Addendum)
He may use Tylenol 500 to 1000 mg every 8 hours as needed.  Would not recommend chronic oral NSAIDs due to CKD.  Additionally he can try Voltaren gel to the knees.

## 2022-05-03 NOTE — Progress Notes (Signed)
[name] - 77 y.o. male MRN [id number]  Date of birth: December 31, 1945  Subjective Chief Complaint  Patient presents with   Hypertension    HPI [name] is a 77 y.o. male here today for follow up visit.    His blood pressure has been increased recently.  He continues on HCTZ, aldactone, amlodipine and hydralazine. He is taking these as directed.  He denies symptoms including chest pain, shortness of breath, palpitations, headache or vision changes.  He does report increased pain in his knee and hip joints.  Continues to undergo treatment for chronic infection of his right hip this is managed by infectious disease.  Continues to see endocrinology for management of diabetes.  Blood sugars have been stable.  Tolerating current medications without hypoglycemia.   ROS:  A comprehensive ROS was completed and negative except as noted per HPI  Allergies  Allergen Reactions   Shellfish Allergy Rash    Past Medical History:  Diagnosis Date   Arthritis    Asthma    no inhaler   Atrial fibrillation ([employer]) 10/16/2020   CKD (chronic kidney disease) 04/13/2020   Coronary artery disease    cardiologist-  dr spruill; last visit 3 mos ago per pt   Enterobacter sepsis ([place]) 04/13/2020   GERD (gastroesophageal reflux disease)    History of MI (myocardial infarction)    1985   Hydronephrosis, left    Hypertension    Myocardial infarction ([place])    Nephrolithiasis    left   Neuromuscular disorder ([facility])    TINGLING IN BOTH HANDS   Presence of tooth-root and mandibular implants    lower dental implants   Prostate cancer ([place])    Prosthetic hip infection ([place]) 04/13/2020   QT prolongation 10/16/2020   Shortness of breath    WITH EXERTION   Thigh abscess 04/13/2020   Type 2 diabetes mellitus ([place])    Vaccine counseling 04/28/2022   Zoster 09/13/2021    Past Surgical History:  Procedure Laterality Date   BUNIONECTOMY     CYSTOSCOPY W/ RETROGRADES Left 03/14/2014   Procedure:  CYSTOSCOPY WITH LEFT RETROGRADE PYELOGRAM, Left Ureteroscopy, Lweft ureteral Stent No string;  Surgeon: [name], MD;  Location: [facility];  Service: Urology;  Laterality: Left;   CYSTOSCOPY W/ URETERAL STENT PLACEMENT Bilateral 01/15/2020   Procedure: CYSTOSCOPY WITH RETROGRADE PYELOGRAM/URETERAL STENT PLACEMENT;  Surgeon: Alexis Frock, MD;  [facility];  Service: Urology;  Laterality: Bilateral;   CYSTOSCOPY/URETEROSCOPY/HOLMIUM LASER/STENT PLACEMENT Bilateral 03/17/2020   Procedure: CYSTOSCOPY/URETEROSCOPY/HOLMIUM LASER/STENT LEFT PLACEMENT;  Surgeon: Festus Aloe, MD;  [facility];  Service: Urology;  Laterality: Bilateral;   HERNIA REPAIR     INCISION AND DRAINAGE Left 03/09/2020   Procedure: INCISION AND DRAINAGE LEFT HIP WITH LINER EXCHANGE;  Surgeon: Gaynelle Arabian, MD;  [facility];  Service: Orthopedics;  Laterality: Left;   IR FLUORO GUIDE CV LINE RIGHT  03/10/2020   IR RADIOLOGIST EVAL & MGMT  04/01/2020   IR RADIOLOGIST EVAL & MGMT  04/15/2020   IR RADIOLOGIST EVAL & MGMT  04/30/2020   IR REMOVAL TUN CV CATH W/O FL  04/22/2020   IR US GUIDE BX ASP/DRAIN  03/17/2020   IR US GUIDE VASC ACCESS RIGHT  03/10/2020   PROSTATE BIOPSY     TOTAL HIP ARTHROPLASTY Left 03-20-2009   TOTAL HIP ARTHROPLASTY  04/09/2012   Procedure: TOTAL HIP ARTHROPLASTY;  Surgeon: [name], MD;  [facility];  Service: Orthopedics;  Laterality:  Right;   TOTAL KNEE ARTHROPLASTY Left 05-16-2008    Social History   Socioeconomic History   Marital status: Widowed    Spouse name: Not on file   Number of children: 4   Years of education: 9   Highest education level: 9th grade  Occupational History   Occupation: Retired  Tobacco Use   Smoking status: Former    Packs/day: 1.00    Years: 25.00    Total pack years: 25.00    Types: Cigarettes    Quit date: 04/11/1984    Years since quitting: 38.0   Smokeless tobacco: Former    Quit date: 04/02/1985  Vaping  Use   Vaping Use: Never used  Substance and Sexual Activity   Alcohol use: Not Currently    Comment: RARE   Drug use: No   Sexual activity: Not Currently  Other Topics Concern   Not on file  Social History Narrative   Lives alone. He had four children and one has deceased. He enjoys watching television.   Social Determinants of Health   Financial Resource Strain: Low Risk  (09/21/2021)   Overall Financial Resource Strain (CARDIA)    Difficulty of Paying Living Expenses: Not hard at all  Food Insecurity: No Food Insecurity (09/21/2021)   Hunger Vital Sign    Worried About Running Out of Food in the Last Year: Never true    Ran Out of Food in the Last Year: Never true  Transportation Needs: No Transportation Needs (09/21/2021)   PRAPARE - Hydrologist (Medical): No    Lack of Transportation (Non-Medical): No  Physical Activity: Sufficiently Active (09/21/2021)   Exercise Vital Sign    Days of Exercise per Week: 5 days    Minutes of Exercise per Session: 40 min  Stress: No Stress Concern Present (09/21/2021)   [place]    Feeling of Stress : Not at all  Social Connections: Moderately Isolated (09/21/2021)   Social Connection and Isolation Panel [NHANES]    Frequency of Communication with Friends and Family: More than three times a week    Frequency of Social Gatherings with Friends and Family: Twice a week    Attends Religious Services: More than 4 times per year    Active Member of [employer] or Organizations: No    Attends Archivist Meetings: Never    Marital Status: Widowed    Family History  Problem Relation Age of Onset   Cancer Mother        breast   Multiple sclerosis Daughter    Cancer Father        prostate   Diabetes Father    Cancer Maternal Uncle        bone    Health Maintenance  Topic Date Due   INFLUENZA VACCINE  11/09/2021   FOOT EXAM  12/02/2021    COVID-19 Vaccine (3 - Mixed Product risk series) 05/20/2023 (Originally 04/07/2021)   Zoster Vaccines- Shingrix (1 of 2) 08/02/2023 (Originally 08/05/1964)   Medicare Annual Wellness (AWV)  09/22/2022   OPHTHALMOLOGY EXAM  10/06/2022   HEMOGLOBIN A1C  10/18/2022   Diabetic kidney evaluation - Urine ACR  12/11/2022   Diabetic kidney evaluation - eGFR measurement  04/29/2023   DTaP/Tdap/Td (2 - Td or Tdap) 04/10/2025   Pneumonia Vaccine 27+ Years old  Completed   Hepatitis C Screening  Completed   HPV VACCINES  Aged Out   COLONOSCOPY (Pts 45-51yr  Insurance coverage will need to be confirmed)  Discontinued     ----------------------------------------------------------------------------------------------------------------------------------------------------------------------------------------------------------------- Physical Exam BP (!) 155/61   Pulse (!) 58   Ht '6\' 2"'$  (1.88 m)   Wt 232 lb (105.2 kg)   SpO2 99%   BMI 29.79 kg/m   Physical Exam Constitutional:      Appearance: Normal appearance.  Eyes:     General: No scleral icterus. Cardiovascular:     Rate and Rhythm: Normal rate and regular rhythm.  Pulmonary:     Effort: Pulmonary effort is normal.     Breath sounds: Normal breath sounds.  Musculoskeletal:     Cervical back: Neck supple.  Neurological:     Mental Status: He is alert.  Psychiatric:        Mood and Affect: Mood normal.        Behavior: Behavior normal.     ------------------------------------------------------------------------------------------------------------------------------------------------------------------------------------------------------------------- Assessment and Plan  HTN (hypertension) Blood pressure remains elevated.  Recheck was a little better.  Referring to advanced hypertension clinic.  Pain may be contributing as well.  Osteoarthritis He may use Tylenol 500 to 1000 mg every 8 hours as needed.  Would not recommend chronic oral  NSAIDs due to CKD.  Additionally he can try Voltaren gel to the knees.   No orders of the defined types were placed in this encounter.   Return in about 3 months (around 08/02/2022) for HTN/Joint pain.    This visit occurred during the SARS-CoV-2 public health emergency.  Safety protocols were in place, including screening questions prior to the visit, additional usage of staff PPE, and extensive cleaning of exam room while observing appropriate contact time as indicated for disinfecting solutions.

## 2022-05-03 NOTE — Patient Instructions (Signed)
Continue tylenol. You may use '3000mg'$ /day.  I would recommend '1000mg'$  every 8 hours as needed.  Try voltaren gel on knees.  You may purchase this over the counter as well.

## 2022-05-13 ENCOUNTER — Ambulatory Visit: Payer: HMO | Admitting: Family Medicine

## 2022-05-18 DIAGNOSIS — J449 Chronic obstructive pulmonary disease, unspecified: Secondary | ICD-10-CM | POA: Diagnosis not present

## 2022-05-18 DIAGNOSIS — E1122 Type 2 diabetes mellitus with diabetic chronic kidney disease: Secondary | ICD-10-CM | POA: Diagnosis not present

## 2022-05-18 DIAGNOSIS — Z6832 Body mass index (BMI) 32.0-32.9, adult: Secondary | ICD-10-CM | POA: Diagnosis not present

## 2022-05-18 DIAGNOSIS — I129 Hypertensive chronic kidney disease with stage 1 through stage 4 chronic kidney disease, or unspecified chronic kidney disease: Secondary | ICD-10-CM | POA: Diagnosis not present

## 2022-05-18 DIAGNOSIS — N1831 Chronic kidney disease, stage 3a: Secondary | ICD-10-CM | POA: Diagnosis not present

## 2022-05-18 DIAGNOSIS — Z7984 Long term (current) use of oral hypoglycemic drugs: Secondary | ICD-10-CM | POA: Diagnosis not present

## 2022-05-18 NOTE — Progress Notes (Unsigned)
Advanced Hypertension Clinic Initial Assessment:    Date:  05/19/2022   ID:  [name], DOB 1946-01-29, MRN [id number]  PCP:  Aaron Nutting, DO  Cardiologist:  None  Nephrologist:  Referring MD: Aaron Nutting, DO   CC: Hypertension  History of Present Illness:    [name] is a 77 y.o. male with a hx of hypertension, chronic infection of right hip (managed by ID), DM2, CAD, osteoarthritis, prostate cancer here to establish care in the Advanced Hypertension Clinic. He was referred by his primary care provider, Aaron. Zigmund [name], due to elevated blood pressure despite HCTZ, Spironolactone, Alodipine, Hydralazine.   [name] was diagnosed with hypertension in the 1990s at the time of his MI.=Blood pressure checked with arm cuff at home. Readings have been mostly 140s/60s. he reports tobacco use remotely having quit 1986. Alcohol use rarely. For exercise he goes to [name] and does predominantly upper body exercises. he eats at home and outside of the home and does not follow low sodium diet. Drinks water and Spite during the day.   Notes his atrial fibrillation when he strains but overall not bothersome. Unclear why he is not on [name], will request notes from primary cardiologist Aaron. Terrence [name]. Reports no chest pain, pressure, tightness. Notes occasional lightheadedness when he gets up in the morning or if he is tired. Does endorse odd sleep schedule going to bed at 4pm and wakes up at 11pm-12am then dozes on and off until about 4am when he gets up to go to the [facility].   Takes Hydralazine 7AM, 12PM, 5PM. Does often check his blood pressure prior to his medications.  Previous antihypertensives: Losartan - Potassium rlrvated  Past Medical History:  Diagnosis Date   Arthritis    Asthma    no inhaler   Atrial fibrillation ([place]) 10/16/2020   CKD (chronic kidney disease) 04/13/2020   Coronary artery disease    cardiologist-  [name]; last visit 3 mos ago per pt    Enterobacter sepsis ([place]) 04/13/2020   GERD (gastroesophageal reflux disease)    History of MI (myocardial infarction)    1985   Hydronephrosis, left    Hypertension    Myocardial infarction ([place])    Nephrolithiasis    left   Neuromuscular disorder (HCC)    TINGLING IN BOTH HANDS   Presence of tooth-root and mandibular implants    lower dental implants   Prostate cancer ([place])    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   QT prolongation 10/16/2020   Shortness of breath    WITH EXERTION   Thigh abscess 04/13/2020   Type 2 diabetes mellitus ([place])    Vaccine counseling 04/28/2022   Zoster 09/13/2021    Past Surgical History:  Procedure Laterality Date   BUNIONECTOMY     CYSTOSCOPY W/ RETROGRADES Left 03/14/2014   Procedure: CYSTOSCOPY WITH LEFT RETROGRADE PYELOGRAM, Left Ureteroscopy, [place] ureteral Stent No string;  Surgeon: [name], MD;  Location: [facility];  Service: Urology;  Laterality: Left;   CYSTOSCOPY W/ URETERAL STENT PLACEMENT Bilateral 01/15/2020   Procedure: CYSTOSCOPY WITH RETROGRADE PYELOGRAM/URETERAL STENT PLACEMENT;  Surgeon: Alexis Frock, MD;  Location: [facility];  Service: Urology;  Laterality: Bilateral;   CYSTOSCOPY/URETEROSCOPY/HOLMIUM LASER/STENT PLACEMENT Bilateral 03/17/2020   Procedure: CYSTOSCOPY/URETEROSCOPY/HOLMIUM LASER/STENT LEFT PLACEMENT;  Surgeon: Festus Aloe, MD;  Location: [facility];  Service: Urology;  Laterality: Bilateral;   HERNIA REPAIR     INCISION AND DRAINAGE Left 03/09/2020   Procedure: INCISION AND  DRAINAGE LEFT HIP WITH LINER EXCHANGE;  Surgeon: Gaynelle Arabian, MD;  Location: [facility];  Service: Orthopedics;  Laterality: Left;   IR FLUORO GUIDE CV LINE RIGHT  03/10/2020   IR RADIOLOGIST EVAL & MGMT  04/01/2020   IR RADIOLOGIST EVAL & MGMT  04/15/2020   IR RADIOLOGIST EVAL & MGMT  04/30/2020   IR REMOVAL TUN CV CATH W/O FL  04/22/2020   IR US GUIDE BX ASP/DRAIN  03/17/2020   IR US GUIDE VASC ACCESS RIGHT  03/10/2020    PROSTATE BIOPSY     TOTAL HIP ARTHROPLASTY Left 03-20-2009   TOTAL HIP ARTHROPLASTY  04/09/2012   Procedure: TOTAL HIP ARTHROPLASTY;  Surgeon: Aaron Alf, MD;  Location: [facility];  Service: Orthopedics;  Laterality: Right;   TOTAL KNEE ARTHROPLASTY Left 05-16-2008    Current Medications: Current Meds  Medication Sig   albuterol (VENTOLIN HFA) 108 (90 Base) MCG/ACT inhaler Inhale 2 puffs into the lungs every 6 (six) hours as needed for wheezing or shortness of breath.   allopurinol (ZYLOPRIM) 100 MG tablet TAKE 1/2 TABLET BY MOUTH EVERY DAY   amiodarone (PACERONE) 200 MG tablet Take 200 mg by mouth daily. Aaron. Terrence [name]   amLODipine (NORVASC) 10 MG tablet TAKE 1 TABLET BY MOUTH EVERY DAY   aspirin 325 MG tablet Take by mouth.   clotrimazole-betamethasone (LOTRISONE) cream APPLY 1 APPLICATION TOPICALLY DAILY   co-enzyme Q-10 30 MG capsule Take by mouth.   colchicine 0.6 MG tablet TAKE 1 TABLET BY MOUTH EVERY DAY   colesevelam (WELCHOL) 625 MG tablet TAKE 2 TABLETS (1,250 MG TOTAL) BY MOUTH DAILY.   Continuous Blood Gluc Sensor (FREESTYLE LIBRE 3 SENSOR) MISC 1 DEVICE BY DOES NOT APPLY ROUTE EVERY 14 (FOURTEEN) DAYS. PLACE 1 SENSOR ON THE SKIN EVERY 14 DAYS. USE TO CHECK GLUCOSE CONTINUOUSLY   docusate sodium (COLACE) 100 MG capsule Take 100 mg by mouth daily as needed for mild constipation.   ferrous sulfate 325 (65 FE) MG tablet Take 325 mg by mouth daily with breakfast.   gabapentin (NEURONTIN) 300 MG capsule Take 1 capsule by mouth as needed.   hydrOXYzine (VISTARIL) 25 MG capsule TAKE 1 CAPSULE BY MOUTH 3 TIMES DAILY AS NEEDED FOR ITCHING.   levofloxacin (LEVAQUIN) 500 MG tablet Take 1 tablet by mouth daily.   metoprolol succinate (TOPROL-XL) 50 MG 24 hr tablet Take 50 mg by mouth daily.   omeprazole (PRILOSEC) 40 MG capsule TAKE 1 CAPSULE BY MOUTH EVERY DAY IN THE MORNING AND AT BEDTIME   polyethylene glycol (MIRALAX / GLYCOLAX) 17 g packet Take 17 g by mouth daily.   repaglinide  (PRANDIN) 0.5 MG tablet TAKE 1 TABLET (0.5 MG TOTAL) BY MOUTH 3 (THREE) TIMES DAILY BEFORE MEALS.   rosuvastatin (CRESTOR) 40 MG tablet Take 40 mg by mouth daily.   sitaGLIPtin (JANUVIA) 50 MG tablet Take 1 tablet (50 mg total) by mouth daily.   spironolactone (ALDACTONE) 25 MG tablet TAKE 1 TABLET BY MOUTH EVERY DAY AS NEEDED FOR EDEMA   [DISCONTINUED] hydrALAZINE (APRESOLINE) 10 MG tablet TAKE 2 TABLETS BY MOUTH 3 TIMES A DAY     Allergies:   Shellfish allergy   Social History   Socioeconomic History   Marital status: Widowed    Spouse name: Not on file   Number of children: 4   Years of education: 9   Highest education level: 9th grade  Occupational History   Occupation: Retired  Tobacco Use   Smoking status: Former  Packs/day: 1.00    Years: 25.00    Total pack years: 25.00    Types: Cigarettes    Quit date: 04/11/1984    Years since quitting: 38.1   Smokeless tobacco: Former    Quit date: 04/02/1985  Vaping Use   Vaping Use: Never used  Substance and Sexual Activity   Alcohol use: Not Currently    Comment: RARE   Drug use: No   Sexual activity: Not Currently  Other Topics Concern   Not on file  Social History Narrative   Lives alone. He had four children and one has deceased. He enjoys watching television.   Social Determinants of Health   Financial Resource Strain: Low Risk  (09/21/2021)   Overall Financial Resource Strain (CARDIA)    Difficulty of Paying Living Expenses: Not hard at all  Food Insecurity: No Food Insecurity (09/21/2021)   Hunger Vital Sign    Worried About Running Out of Food in the Last Year: Never true    Ran Out of Food in the Last Year: Never true  Transportation Needs: No Transportation Needs (09/21/2021)   PRAPARE - Hydrologist (Medical): No    Lack of Transportation (Non-Medical): No  Physical Activity: Sufficiently Active (09/21/2021)   Exercise Vital Sign    Days of Exercise per Week: 5 days    Minutes  of Exercise per Session: 40 min  Stress: No Stress Concern Present (09/21/2021)   [place]    Feeling of Stress : Not at all  Social Connections: Moderately Isolated (09/21/2021)   Social Connection and Isolation Panel [NHANES]    Frequency of Communication with Friends and Family: More than three times a week    Frequency of Social Gatherings with Friends and Family: Twice a week    Attends Religious Services: More than 4 times per year    Active Member of [employer] or Organizations: No    Attends Archivist Meetings: Never    Marital Status: Widowed     Family History: The patient's family history includes Cancer in his father, maternal uncle, and mother; Diabetes in his father; Multiple sclerosis in his daughter.  ROS:   Please see the history of present illness.    All other systems reviewed and are negative.  EKGs/Labs/Other Studies Reviewed:    EKG:  EKG is ordered today.  The ekg ordered today demonstrates SB 59 bpm with first degree AV blcok PR 334.   Recent Labs: 04/28/2022: BUN 32; Creat 1.59; Hemoglobin 9.9; Platelets 272; Potassium 4.8; Sodium 141   Recent Lipid Panel    Component Value Date/Time   CHOL 121 12/10/2021 0821   TRIG 146.0 12/10/2021 0821   HDL 37.40 (L) 12/10/2021 0821   CHOLHDL 3 12/10/2021 0821   VLDL 29.2 12/10/2021 0821   LDLCALC 54 12/10/2021 0821   LDLCALC 146 (H) 05/18/2020 0000   LDLDIRECT 132 (H) 10/24/2019 0807    Physical Exam:   VS:  BP (!) 154/67 (BP Location: Left Arm, Patient Position: Sitting, Cuff Size: Normal)   Pulse (!) 59   Ht 6' (1.829 m)   Wt 233 lb (105.7 kg)   BMI 31.60 kg/m  , BMI Body mass index is 31.6 kg/m. GENERAL:  Well appearing, overweight HEENT: Pupils equal round and reactive, fundi not visualized, oral mucosa unremarkable NECK:  No jugular venous distention, waveform within normal limits, carotid upstroke brisk and symmetric, no  bruits, no thyromegaly LYMPHATICS:  No cervical adenopathy LUNGS:  Clear to auscultation bilaterally HEART:  RRR.  PMI not displaced or sustained,S1 and S2 within normal limits, no S3, no S4, no clicks, no rubs, no murmurs ABD:  Flat, positive bowel sounds normal in frequency in pitch, no bruits, no rebound, no guarding, no midline pulsatile mass, no hepatomegaly, no splenomegaly EXT:  2 plus pulses throughout, no edema, no cyanosis no clubbing SKIN:  No rashes no nodules NEURO:  Cranial nerves II through XII grossly intact, motor grossly intact throughout PSYCH:  Cognitively intact, oriented to person place and time   ASSESSMENT/PLAN:    HTN - BP not at goal <130/80. Readings at home 140s/60s. Increase Hydralazine to '25mg'$  TID. Educated to check BP at home at least 1 hour after medications.  Continue Amlodipine '10mg'$  QD, Spironolactone '25mg'$  QD, TOprol '50mg'$  QD. Previously on Losartan which was stopped due to hyperkalemia per his report. Renal duplex to r/o stenosis as contributory to hypertension.  BP symmetrical, no concern for aortic coarctation. Labs in the next couple weeks including thyroid panel, metanephrines, catecholamines, cortisol, CMP Suspect BP control would improve with low salt diet, referred to PREP program at [facility].  He does have an odd sleep schedule going to bed at 4pm, waking at 11pm, then nodding off/on until he gets up at 4am. Discussed sleep hygiene. Could consider sleep study at follow up if BP control remains difficult.   CKD - Careful titration of diuretic and antihypertensive.  BMP ordered for monitoring.   CAD - Prior MI 30. Myoview 05/2021 low risk study. Follows with Aaron. Terrence [name]. GDMT Atorvastatin '40mg'$  QD.   PAF / On Amiodarone therapy - SB by EKG today. Reports rare palpitations. Follows with Aaron. Terrence [name]  HLD, LDL goal < 70 - 12/2021 LDL 54. Continue Rosuvastatin.   DM2 - Follows with endocrinology.  Obesity - Weight loss via diet and exercise encouraged.  Discussed the impact being overweight would have on cardiovascular risk. Refer to [employer] program at [facility].  Screening for Secondary Hypertension:     05/19/2022    9:12 AM  Causes  Renovascular HTN Screened  Thyroid Disease Screened  Hyperaldosteronism Not Screened     - Comments already on Arlyce Harman - could stop in future to assess if indicated  Pheochromocytoma Screened  Cushing's Syndrome Screened  Coarctation of the Aorta N/A     - Comments symmetrical BP  Compliance Screened    Relevant Labs/Studies:    Latest Ref Rng & Units 04/28/2022    8:40 AM 12/10/2021    8:21 AM 10/26/2021   10:33 AM  Basic Labs  Sodium 135 - 146 mmol/L 141  138  138   Potassium 3.5 - 5.3 mmol/L 4.8  4.8  5.6   Creatinine 0.70 - 1.28 mg/dL 1.59  1.63  1.70        Latest Ref Rng & Units 10/23/2018   10:18 AM 06/24/2016    2:04 PM  Thyroid   TSH 0.35 - 4.50 uIU/mL 1.72  1.307                 05/19/2022    9:08 AM  Renovascular   Renal Artery [place] Completed Yes      he  interested in enrolling in the PREP exercise and nutrition program through the [facility].     Disposition:    FU with MD/PharmD in 6-8 weeks    Medication Adjustments/Labs and Tests Ordered: Current medicines are reviewed at length with the patient today.  Concerns  regarding medicines are outlined above.  Orders Placed This Encounter  Procedures   Metanephrines, plasma   Catecholamines, fractionated, plasma   Thyroid Panel With TSH   Cortisol   Comprehensive metabolic panel   Amb Referral To Provider Referral Exercise Program (P.R.E.P)   EKG 12-Lead   VAS US RENAL ARTERY DUPLEX   Meds ordered this encounter  Medications   hydrALAZINE (APRESOLINE) 25 MG tablet    Sig: Take 1 tablet (25 mg total) by mouth 3 (three) times daily.    Dispense:  270 tablet    Refill:  3     Signed, Loel Dubonnet, NP  05/19/2022 9:13 AM    [facility]

## 2022-05-19 ENCOUNTER — Encounter (HOSPITAL_COMMUNITY): Payer: Self-pay | Admitting: *Deleted

## 2022-05-19 ENCOUNTER — Encounter (HOSPITAL_BASED_OUTPATIENT_CLINIC_OR_DEPARTMENT_OTHER): Payer: Self-pay | Admitting: Family

## 2022-05-19 ENCOUNTER — Ambulatory Visit (INDEPENDENT_AMBULATORY_CARE_PROVIDER_SITE_OTHER): Payer: PPO | Admitting: Family

## 2022-05-19 VITALS — BP 154/67 | HR 59 | Ht 72.0 in | Wt 233.0 lb

## 2022-05-19 DIAGNOSIS — I25118 Atherosclerotic heart disease of native coronary artery with other forms of angina pectoris: Secondary | ICD-10-CM | POA: Diagnosis not present

## 2022-05-19 DIAGNOSIS — Z6831 Body mass index (BMI) 31.0-31.9, adult: Secondary | ICD-10-CM

## 2022-05-19 DIAGNOSIS — Z79899 Other long term (current) drug therapy: Secondary | ICD-10-CM

## 2022-05-19 DIAGNOSIS — I10 Essential (primary) hypertension: Secondary | ICD-10-CM | POA: Diagnosis not present

## 2022-05-19 DIAGNOSIS — I480 Paroxysmal atrial fibrillation: Secondary | ICD-10-CM | POA: Diagnosis not present

## 2022-05-19 DIAGNOSIS — E785 Hyperlipidemia, unspecified: Secondary | ICD-10-CM | POA: Diagnosis not present

## 2022-05-19 MED ORDER — HYDRALAZINE HCL 25 MG PO TABS: 25.0000 mg | ORAL_TABLET | Freq: Three times a day (TID) | ORAL | 3 refills | Status: DC

## 2022-05-19 NOTE — Patient Instructions (Addendum)
Medication Instructions:  Your physician has recommended you make the following change in your medication:  Change: Hydralazine '25mg'$  three times per day   Labwork: Please return for Lab work in the next week or so for CMP, Catecholamines, Metanephrines, cortisol and thyroid panel. You may come to the...   [facility] (3rd floor) [address], [place], [place] [address]  Open: 8am-Noon and 1pm-4:30pm  Please ring the doorbell on the small table when you exit the elevator and the Lab Tech will come get you  [place] at [facility] [address] [place], [place], [place] [address] Open: 8am-1pm, then 2pm-4:30pm   [place]- Please see attached locations sheet stapled to your lab work with address and hours.     Testing/Procedures: Your physician has requested that you have a renal artery duplex. During this test, an ultrasound is used to evaluate blood flow to the kidneys. Allow one hour for this exam. Do not eat after midnight the day before and avoid carbonated beverages. Take your medications as you usually do.    Follow-Up: Follow up in 6-8 Weeks with Dr. Oval Linsey, Laurann Montana, NP or [facility] in ADV HTN CLINIC   Special Instructions:  You will receive a phone call from the PREP exercise and nutrition program to schedule an initial assessment. DASH Eating Plan DASH stands for Dietary Approaches to Stop Hypertension. The DASH eating plan is a healthy eating plan that has been shown to: Reduce high blood pressure (hypertension). Reduce your risk for type 2 diabetes, heart disease, and stroke. Help with weight loss. What are tips for following this plan? Reading food labels Check food labels for the amount of salt (sodium) per serving. Choose foods with less than 5 percent of the Daily Value of sodium. Generally, foods with less than 300 milligrams (mg) of sodium per serving fit into this eating plan. To find whole grains, look for the  word "whole" as the first word in the ingredient list. Shopping Buy products labeled as "low-sodium" or "no salt added." Buy fresh foods. Avoid canned foods and pre-made or frozen meals. Cooking Avoid adding salt when cooking. Use salt-free seasonings or herbs instead of table salt or sea salt. Check with your health care provider or pharmacist before using salt substitutes. Do not fry foods. Cook foods using healthy methods such as baking, boiling, grilling, roasting, and broiling instead. Cook with heart-healthy oils, such as olive, canola, avocado, soybean, or sunflower oil. Meal planning  Eat a balanced diet that includes: 4 or more servings of fruits and 4 or more servings of vegetables each day. Try to fill one-half of your plate with fruits and vegetables. 6-8 servings of whole grains each day. Less than 6 oz (170 g) of lean meat, poultry, or fish each day. A 3-oz (85-g) serving of meat is about the same size as a deck of cards. One egg equals 1 oz (28 g). 2-3 servings of low-fat dairy each day. One serving is 1 cup (237 mL). 1 serving of nuts, seeds, or beans 5 times each week. 2-3 servings of heart-healthy fats. Healthy fats called omega-3 fatty acids are found in foods such as walnuts, flaxseeds, fortified milks, and eggs. These fats are also found in cold-water fish, such as sardines, salmon, and mackerel. Limit how much you eat of: Canned or prepackaged foods. Food that is high in trans fat, such as some fried foods. Food that is high in saturated fat, such as fatty meat. Desserts and other  sweets, sugary drinks, and other foods with added sugar. Full-fat dairy products. Do not salt foods before eating. Do not eat more than 4 egg yolks a week. Try to eat at least 2 vegetarian meals a week. Eat more home-cooked food and less restaurant, buffet, and fast food. Lifestyle When eating at a restaurant, ask that your food be prepared with less salt or no salt, if possible. If you  drink alcohol: Limit how much you use to: 0-1 drink a day for women who are not pregnant. 0-2 drinks a day for men. Be aware of how much alcohol is in your drink. In the [place] one drink equals one 12 oz bottle of beer (355 mL), one 5 oz glass of wine (148 mL), or one 1 oz glass of hard liquor (44 mL). General information Avoid eating more than 2,300 mg of salt a day. If you have hypertension, you may need to reduce your sodium intake to 1,500 mg a day. Work with your health care provider to maintain a healthy body weight or to lose weight. Ask what an ideal weight is for you. Get at least 30 minutes of exercise that causes your heart to beat faster (aerobic exercise) most days of the week. Activities may include walking, swimming, or biking. Work with your health care provider or dietitian to adjust your eating plan to your individual calorie needs. What foods should I eat? Fruits All fresh, dried, or frozen fruit. Canned fruit in natural juice (without added sugar). Vegetables Fresh or frozen vegetables (raw, steamed, roasted, or grilled). Low-sodium or reduced-sodium tomato and vegetable juice. Low-sodium or reduced-sodium tomato sauce and tomato paste. Low-sodium or reduced-sodium canned vegetables. Grains Whole-grain or whole-wheat bread. Whole-grain or whole-wheat pasta. Brown rice. [place]. Bulgur. Whole-grain and low-sodium cereals. Pita bread. Low-fat, low-sodium crackers. Whole-wheat flour tortillas. Meats and other proteins Skinless chicken or [place]. Ground chicken or [place]. Pork with fat trimmed off. Fish and seafood. Egg whites. Dried beans, peas, or lentils. Unsalted nuts, nut butters, and seeds. Unsalted canned beans. Lean cuts of beef with fat trimmed off. Low-sodium, lean precooked or cured meat, such as sausages or meat loaves. Dairy Low-fat (1%) or fat-free (skim) milk. Reduced-fat, low-fat, or fat-free cheeses. Nonfat, low-sodium ricotta or cottage cheese. Low-fat or  nonfat yogurt. Low-fat, low-sodium cheese. Fats and oils Soft margarine without trans fats. Vegetable oil. Reduced-fat, low-fat, or light mayonnaise and salad dressings (reduced-sodium). Canola, safflower, olive, avocado, soybean, and sunflower oils. Avocado. Seasonings and condiments Herbs. Spices. Seasoning mixes without salt. Other foods Unsalted popcorn and pretzels. Fat-free sweets. The items listed above may not be a complete list of foods and beverages you can eat. Contact a dietitian for more information. What foods should I avoid? Fruits Canned fruit in a light or heavy syrup. Fried fruit. Fruit in cream or butter sauce. Vegetables Creamed or fried vegetables. Vegetables in a cheese sauce. Regular canned vegetables (not low-sodium or reduced-sodium). Regular canned tomato sauce and paste (not low-sodium or reduced-sodium). Regular tomato and vegetable juice (not low-sodium or reduced-sodium). Angie Fava. Olives. Grains Baked goods made with fat, such as croissants, muffins, or some breads. Dry pasta or rice meal packs. Meats and other proteins Fatty cuts of meat. Ribs. Fried meat. Berniece Salines. Bologna, salami, and other precooked or cured meats, such as sausages or meat loaves. Fat from the back of a pig (fatback). Bratwurst. Salted nuts and seeds. Canned beans with added salt. Canned or smoked fish. Whole eggs or egg yolks. Chicken or [place] with skin.  Dairy Whole or 2% milk, cream, and half-and-half. Whole or full-fat cream cheese. Whole-fat or sweetened yogurt. Full-fat cheese. Nondairy creamers. Whipped toppings. Processed cheese and cheese spreads. Fats and oils Butter. Stick margarine. Lard. Shortening. Ghee. Bacon fat. Tropical oils, such as coconut, palm kernel, or palm oil. Seasonings and condiments Onion salt, garlic salt, seasoned salt, table salt, and sea salt. Worcestershire sauce. Tartar sauce. Barbecue sauce. Teriyaki sauce. Soy sauce, including reduced-sodium. Steak sauce.  Canned and packaged gravies. Fish sauce. Oyster sauce. Cocktail sauce. Store-bought horseradish. Ketchup. Mustard. Meat flavorings and tenderizers. Bouillon cubes. Hot sauces. Pre-made or packaged marinades. Pre-made or packaged taco seasonings. Relishes. Regular salad dressings. Other foods Salted popcorn and pretzels. The items listed above may not be a complete list of foods and beverages you should avoid. Contact a dietitian for more information. Where to find more information National Heart, Lung, and Blood Institute: https://[place] Helbert-eaton.com/ American Heart Association: www.heart.org Academy of Nutrition and Dietetics: www.eatright.[place]: www.kidney.org Summary The DASH eating plan is a healthy eating plan that has been shown to reduce high blood pressure (hypertension). It may also reduce your risk for type 2 diabetes, heart disease, and stroke. When on the DASH eating plan, aim to eat more fresh fruits and vegetables, whole grains, lean proteins, low-fat dairy, and heart-healthy fats. With the DASH eating plan, you should limit salt (sodium) intake to 2,300 mg a day. If you have hypertension, you may need to reduce your sodium intake to 1,500 mg a day. Work with your health care provider or dietitian to adjust your eating plan to your individual calorie needs. This information is not intended to replace advice given to you by your health care provider. Make sure you discuss any questions you have with your health care provider. Document Revised: 03/01/2019 Document Reviewed: 03/01/2019 Elsevier Patient Education  [place].

## 2022-05-20 ENCOUNTER — Other Ambulatory Visit: Payer: Self-pay | Admitting: Family Medicine

## 2022-05-21 ENCOUNTER — Other Ambulatory Visit: Payer: Self-pay | Admitting: Family Medicine

## 2022-05-24 DIAGNOSIS — E785 Hyperlipidemia, unspecified: Secondary | ICD-10-CM | POA: Diagnosis not present

## 2022-05-24 DIAGNOSIS — I25118 Atherosclerotic heart disease of native coronary artery with other forms of angina pectoris: Secondary | ICD-10-CM | POA: Diagnosis not present

## 2022-05-24 DIAGNOSIS — Z6831 Body mass index (BMI) 31.0-31.9, adult: Secondary | ICD-10-CM | POA: Diagnosis not present

## 2022-05-24 DIAGNOSIS — I10 Essential (primary) hypertension: Secondary | ICD-10-CM | POA: Diagnosis not present

## 2022-06-03 ENCOUNTER — Other Ambulatory Visit (HOSPITAL_BASED_OUTPATIENT_CLINIC_OR_DEPARTMENT_OTHER): Payer: Self-pay | Admitting: Family

## 2022-06-03 DIAGNOSIS — I10 Essential (primary) hypertension: Secondary | ICD-10-CM

## 2022-06-03 DIAGNOSIS — E875 Hyperkalemia: Secondary | ICD-10-CM

## 2022-06-03 DIAGNOSIS — R7989 Other specified abnormal findings of blood chemistry: Secondary | ICD-10-CM

## 2022-06-03 LAB — COMPREHENSIVE METABOLIC PANEL
ALT: 20 IU/L (ref 0–44)
AST: 19 IU/L (ref 0–40)
Albumin/Globulin Ratio: 1.4 (ref 1.2–2.2)
Albumin: 4.1 g/dL (ref 3.8–4.8)
Alkaline Phosphatase: 65 IU/L (ref 44–121)
BUN/Creatinine Ratio: 16 (ref 10–24)
BUN: 26 mg/dL (ref 8–27)
Bilirubin Total: 0.2 mg/dL (ref 0.0–1.2)
CO2: 18 mmol/L — ABNORMAL LOW (ref 20–29)
Calcium: 9.1 mg/dL (ref 8.6–10.2)
Chloride: 109 mmol/L — ABNORMAL HIGH (ref 96–106)
Creatinine, Ser: 1.58 mg/dL — ABNORMAL HIGH (ref 0.76–1.27)
Globulin, Total: 2.9 g/dL (ref 1.5–4.5)
Glucose: 107 mg/dL — ABNORMAL HIGH (ref 70–99)
Potassium: 5.4 mmol/L — ABNORMAL HIGH (ref 3.5–5.2)
Sodium: 140 mmol/L (ref 134–144)
Total Protein: 7 g/dL (ref 6.0–8.5)
eGFR: 45 mL/min/{1.73_m2} — ABNORMAL LOW (ref >59)

## 2022-06-03 LAB — CORTISOL: Cortisol: 11.5 ug/dL (ref 6.2–19.4)

## 2022-06-03 LAB — CATECHOLAMINES, FRACTIONATED, PLASMA
Dopamine: <30 pg/mL (ref 0–48)
Epinephrine: 46 pg/mL (ref 0–62)
Norepinephrine: 1042 pg/mL — ABNORMAL HIGH (ref 0–874)

## 2022-06-03 LAB — THYROID PANEL WITH TSH
Free Thyroxine Index: 2.4 (ref 1.2–4.9)
T3 Uptake Ratio: 24 % (ref 24–39)
T4, Total: 9.8 ug/dL (ref 4.5–12.0)
TSH: 9.39 u[IU]/mL — ABNORMAL HIGH (ref 0.450–4.500)

## 2022-06-03 LAB — METANEPHRINES, PLASMA
Metanephrine, Free: 38.8 pg/mL (ref 0.0–88.0)
Normetanephrine, Free: 164.5 pg/mL (ref 0.0–285.2)

## 2022-06-03 MED ORDER — SPIRONOLACTONE 25 MG PO TABS: 12.5000 mg | ORAL_TABLET | Freq: Every day | ORAL | 1 refills | Status: DC

## 2022-06-07 ENCOUNTER — Other Ambulatory Visit: Payer: Self-pay | Admitting: Pharmacist

## 2022-06-07 NOTE — Progress Notes (Signed)
Patient appearing on report for [facility] Hypertension Control report due to last documented ambulatory blood pressure of 154/67 on 05/19/22. Next appointment with PCP is 08/02/22. Of note, patient sees HTN clinic 06/30/22.    Outreached patient to discuss hypertension control and medication management.   Current antihypertensives: hydralazine '25mg'$  TID (sometimes forgets and it goes a bit late, but still getting TID mostly. Sometimes skips noon dose), amlodipine '10mg'$  daily,   Patient has an automated upper arm home BP machine.  Current blood pressure readings: 153/57, 151/66, 153/59, 149/58, 150/54. 143/58 this morning.  Patient denies hypotensive signs and symptoms including dizziness, lightheadedness.  Patient denies hypertensive symptoms including headache, chest pain, shortness of breath.   Assessment/Plan: - Currently uncontrolled - - Reviewed appropriate administration of medication regimen - Reviewed appropriate home BP monitoring technique (avoid caffeine, smoking, and exercise for 30 minutes before checking, rest for at least 5 minutes before taking BP, sit with feet flat on the floor and back against a hard surface, uncross legs, and rest arm on flat surface) - Reviewed to check blood pressure daily, document, and provide at next provider visit - Recommend continue current regimen,  advised patient to not skip the noontime hydralazine dose even if it gets a few hours later than noon. Patient verbalized understanding.  Larinda Buttery, PharmD Clinical Pharmacist [facility] [phone]

## 2022-06-10 ENCOUNTER — Telehealth (HOSPITAL_BASED_OUTPATIENT_CLINIC_OR_DEPARTMENT_OTHER): Payer: Self-pay

## 2022-06-10 ENCOUNTER — Ambulatory Visit (INDEPENDENT_AMBULATORY_CARE_PROVIDER_SITE_OTHER): Payer: PPO

## 2022-06-10 DIAGNOSIS — I10 Essential (primary) hypertension: Secondary | ICD-10-CM | POA: Diagnosis not present

## 2022-06-10 DIAGNOSIS — Z6831 Body mass index (BMI) 31.0-31.9, adult: Secondary | ICD-10-CM | POA: Diagnosis not present

## 2022-06-10 DIAGNOSIS — I25118 Atherosclerotic heart disease of native coronary artery with other forms of angina pectoris: Secondary | ICD-10-CM

## 2022-06-10 DIAGNOSIS — E785 Hyperlipidemia, unspecified: Secondary | ICD-10-CM

## 2022-06-10 NOTE — Telephone Encounter (Addendum)
Results called to patient who verbalizes understanding!    ----- Message from Loel Dubonnet, NP sent at 06/10/2022 11:28 AM EST ----- No renal artery stenosis. Good result!

## 2022-06-17 ENCOUNTER — Other Ambulatory Visit: Payer: Self-pay | Admitting: Family Medicine

## 2022-06-23 ENCOUNTER — Other Ambulatory Visit: Payer: Self-pay | Admitting: Family Medicine

## 2022-06-30 ENCOUNTER — Ambulatory Visit (INDEPENDENT_AMBULATORY_CARE_PROVIDER_SITE_OTHER): Payer: PPO | Admitting: Family

## 2022-06-30 ENCOUNTER — Encounter (HOSPITAL_BASED_OUTPATIENT_CLINIC_OR_DEPARTMENT_OTHER): Payer: Self-pay | Admitting: Family

## 2022-06-30 VITALS — BP 150/82 | HR 58 | Ht 72.0 in | Wt 229.0 lb

## 2022-06-30 DIAGNOSIS — Z79899 Other long term (current) drug therapy: Secondary | ICD-10-CM | POA: Diagnosis not present

## 2022-06-30 DIAGNOSIS — I10 Essential (primary) hypertension: Secondary | ICD-10-CM

## 2022-06-30 DIAGNOSIS — R7989 Other specified abnormal findings of blood chemistry: Secondary | ICD-10-CM

## 2022-06-30 DIAGNOSIS — E785 Hyperlipidemia, unspecified: Secondary | ICD-10-CM

## 2022-06-30 DIAGNOSIS — E875 Hyperkalemia: Secondary | ICD-10-CM

## 2022-06-30 DIAGNOSIS — I25118 Atherosclerotic heart disease of native coronary artery with other forms of angina pectoris: Secondary | ICD-10-CM | POA: Diagnosis not present

## 2022-06-30 DIAGNOSIS — I480 Paroxysmal atrial fibrillation: Secondary | ICD-10-CM | POA: Diagnosis not present

## 2022-06-30 MED ORDER — HYDRALAZINE HCL 50 MG PO TABS: 50.0000 mg | ORAL_TABLET | Freq: Three times a day (TID) | ORAL | 3 refills | Status: DC

## 2022-06-30 NOTE — Patient Instructions (Addendum)
Medication Instructions:  Your physician has recommended you make the following change in your medication:   Increase: Hydralazine 50mg  three times day    Labwork: Your physician recommends that you return for lab work today- BMP   Your physician recommends that you return for lab work  the day of your follow up appointment for Thyroid Panel    Follow-Up: May 16th at [place] with Laurann Montana, NP in ADV HTN CLINIC   Special Instructions:  Tips to Measure your Blood Pressure Correctly  To determine whether you have hypertension, a medical professional will take a blood pressure reading. How you prepare for the test, the position of your arm, and other factors can change a blood pressure reading by 10% or more. That could be enough to hide high blood pressure, start you on a drug you don't really need, or lead your doctor to incorrectly adjust your medications.  National and international guidelines offer specific instructions for measuring blood pressure. If a doctor, nurse, or medical assistant isn't doing it right, don't hesitate to ask him or her to get with the guidelines.  Here's what you can do to ensure a correct reading:  Don't drink a caffeinated beverage or smoke during the 30 minutes before the test.  Sit quietly for five minutes before the test begins.  During the measurement, sit in a chair with your feet on the floor and your arm supported so your elbow is at about heart level.  The inflatable part of the cuff should completely cover at least 80% of your upper arm, and the cuff should be placed on bare skin, not over a shirt.  Don't talk during the measurement.  Have your blood pressure measured twice, with a brief break in between. If the readings are different by 5 points or more, have it done a third time.  In 2017, new guidelines from the [employer], the [employer] of Cardiology, and nine other health organizations lowered the diagnosis of high blood  pressure to 130/80 mm Hg or higher for all adults. The guidelines also redefined the various blood pressure categories to now include normal, elevated, Stage 1 hypertension, Stage 2 hypertension, and hypertensive crisis (see "Blood pressure categories").  Blood pressure categories  Blood pressure category SYSTOLIC (upper number)  DIASTOLIC (lower number)  Normal Less than 120 mm Hg and Less than 80 mm Hg  Elevated 120-129 mm Hg and Less than 80 mm Hg  High blood pressure: Stage 1 hypertension 130-139 mm Hg or 80-89 mm Hg  High blood pressure: Stage 2 hypertension 140 mm Hg or higher or 90 mm Hg or higher  Hypertensive crisis (consult your doctor immediately) Higher than 180 mm Hg and/or Higher than 120 mm Hg  Source: American Heart Association and American Stroke Association. For more on getting your blood pressure under control, buy Controlling Your Blood Pressure, a Special Health Report from [facility].   Blood Pressure Log   Date   Time  Blood Pressure  Position  Example: Nov 1 9 AM 124/78 sitting

## 2022-06-30 NOTE — Progress Notes (Signed)
Advanced Hypertension Clinic Initial Assessment:    Date:  06/30/2022   ID:  [name], DOB July 03, 1945, MRN [id number]  PCP:  Luetta Nutting, DO  Cardiologist:  None  Nephrologist:  Referring MD: Luetta Nutting, DO   CC: Hypertension  History of Present Illness:    [name] is a 77 y.o. male with a hx of hypertension, chronic infection of right hip (managed by ID), DM2, CAD, osteoarthritis, prostate cancer here to follow up in the Advanced Hypertension Clinic. He was referred by his primary care provider, Dr. Zigmund [name], due to elevated blood pressure despite HCTZ, Spironolactone, Alodipine, Hydralazine.   Initial ADV HTN clinic visit 05/19/22. Follows with Dr. Terrence [name] for general cardiology. [name] was diagnosed with hypertension in the 1990s at the time of his MI. Blood pressure checked with arm cuff at home mostly 140s/60s. he reports tobacco use remotely having quit 1986. Alcohol use rarely. For exercise he goes to [facility] and does predominantly upper body exercises. he eats at home and outside of the home and does not follow low sodium diet. Drinks water and Spite during the day.   Notes his atrial fibrillation when he strains but overall not bothersome. Unclear why he is not on [facility], notes from primary cardiologist received but atrial fibrillation not clearly addressed.   At initial visit Hydralazine increased to 25mg  TID. Amlodipine, Spironolactone, Toprol continued. Renal duplex 06/10/22 with no stenosis. Metanephrines, catecholamines, cortisol were normal. Mildly elevated TSH but T3/T4 normal with plans to repeat in 3 months. Potassium mildly elevated and spironolactone reduced to 12.5mg  QD.   He presents today for follow up with family member.  BP at home routinely in the 140s-150s though is checking prior to medications.  I have asked him to check blood pressure at least an hour after his medications.  Does eat out often at restaurants at [place]  and discussed tactics for following low-sodium, heart healthy diet.  Notes 1 week ago he hit his elbow and has developed what appears to be a fluid-filled cyst on his left elbow.  No bruising or pain but he tells me it feels like a "cushion "when he rests his arm on that elbow.  Continues to exercise at the [facility].  Reports no shortness of breath nor dyspnea on exertion. Reports no chest pain, pressure, or tightness. No edema, orthopnea, PND. Reports no palpitations.    Previous antihypertensives: Losartan - Potassium rlrvated  Past Medical History:  Diagnosis Date   Arthritis    Asthma    no inhaler   Atrial fibrillation ([place]) 10/16/2020   CKD (chronic kidney disease) 04/13/2020   Coronary artery disease    cardiologist-  dr spruill; last visit 3 mos ago per pt   Enterobacter sepsis ([place]) 04/13/2020   GERD (gastroesophageal reflux disease)    History of MI (myocardial infarction)    1985   Hydronephrosis, left    Hypertension    Myocardial infarction ([place])    Nephrolithiasis    left   Neuromuscular disorder (HCC)    TINGLING IN BOTH HANDS   Presence of tooth-root and mandibular implants    lower dental implants   Prostate cancer ([place])    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   QT prolongation 10/16/2020   Shortness of breath    WITH EXERTION   Thigh abscess 04/13/2020   Type 2 diabetes mellitus ([place])    Vaccine counseling 04/28/2022   Zoster 09/13/2021    Past Surgical History:  Procedure Laterality Date   BUNIONECTOMY     CYSTOSCOPY W/ RETROGRADES Left 03/14/2014   Procedure: CYSTOSCOPY WITH LEFT RETROGRADE PYELOGRAM, Left Ureteroscopy, [place] ureteral Stent No string;  Surgeon: [name], MD;  Location: [facility];  Service: Urology;  Laterality: Left;   CYSTOSCOPY W/ URETERAL STENT PLACEMENT Bilateral 01/15/2020   Procedure: CYSTOSCOPY WITH RETROGRADE PYELOGRAM/URETERAL STENT PLACEMENT;  Surgeon: Alexis Frock, MD;  Location: [facility];  Service:  Urology;  Laterality: Bilateral;   CYSTOSCOPY/URETEROSCOPY/HOLMIUM LASER/STENT PLACEMENT Bilateral 03/17/2020   Procedure: CYSTOSCOPY/URETEROSCOPY/HOLMIUM LASER/STENT LEFT PLACEMENT;  Surgeon: Aaron Aloe, MD;  Location: [facility];  Service: Urology;  Laterality: Bilateral;   HERNIA REPAIR     INCISION AND DRAINAGE Left 03/09/2020   Procedure: INCISION AND DRAINAGE LEFT HIP WITH LINER EXCHANGE;  Surgeon: Aaron Arabian, MD;  Location: [facility];  Service: Orthopedics;  Laterality: Left;   IR FLUORO GUIDE CV LINE RIGHT  03/10/2020   IR RADIOLOGIST EVAL & MGMT  04/01/2020   IR RADIOLOGIST EVAL & MGMT  04/15/2020   IR RADIOLOGIST EVAL & MGMT  04/30/2020   IR REMOVAL TUN CV CATH W/O FL  04/22/2020   IR US GUIDE BX ASP/DRAIN  03/17/2020   IR US GUIDE VASC ACCESS RIGHT  03/10/2020   PROSTATE BIOPSY     TOTAL HIP ARTHROPLASTY Left 03-20-2009   TOTAL HIP ARTHROPLASTY  04/09/2012   Procedure: TOTAL HIP ARTHROPLASTY;  Surgeon: Aaron Alf, MD;  Location: [facility];  Service: Orthopedics;  Laterality: Right;   TOTAL KNEE ARTHROPLASTY Left 05-16-2008    Current Medications: Current Meds  Medication Sig   albuterol (VENTOLIN HFA) 108 (90 Base) MCG/ACT inhaler Inhale 2 puffs into the lungs every 6 (six) hours as needed for wheezing or shortness of breath.   allopurinol (ZYLOPRIM) 100 MG tablet TAKE 1/2 TABLET BY MOUTH EVERY DAY   amiodarone (PACERONE) 200 MG tablet Take 200 mg by mouth daily. Dr. Terrence [name]   amLODipine (NORVASC) 10 MG tablet TAKE 1 TABLET BY MOUTH EVERY DAY   aspirin 325 MG tablet Take by mouth.   colchicine 0.6 MG tablet TAKE 1 TABLET BY MOUTH EVERY DAY   Continuous Blood Gluc Sensor (FREESTYLE LIBRE 3 SENSOR) MISC 1 DEVICE BY DOES NOT APPLY ROUTE EVERY 14 (FOURTEEN) DAYS. PLACE 1 SENSOR ON THE SKIN EVERY 14 DAYS. USE TO CHECK GLUCOSE CONTINUOUSLY   docusate sodium (COLACE) 100 MG capsule Take 100 mg by mouth daily as needed for mild constipation.   ferrous sulfate 325 (65 FE) MG tablet  Take 325 mg by mouth daily with breakfast.   levofloxacin (LEVAQUIN) 500 MG tablet Take 1 tablet by mouth daily.   metoprolol succinate (TOPROL-XL) 50 MG 24 hr tablet Take 50 mg by mouth daily.   repaglinide (PRANDIN) 0.5 MG tablet TAKE 1 TABLET (0.5 MG TOTAL) BY MOUTH 3 (THREE) TIMES DAILY BEFORE MEALS.   sitaGLIPtin (JANUVIA) 50 MG tablet Take 1 tablet (50 mg total) by mouth daily.   spironolactone (ALDACTONE) 25 MG tablet Take 0.5 tablets (12.5 mg total) by mouth daily.   [DISCONTINUED] hydrALAZINE (APRESOLINE) 25 MG tablet Take 1 tablet (25 mg total) by mouth 3 (three) times daily.     Allergies:   Shellfish allergy   Social History   Socioeconomic History   Marital status: Widowed    Spouse name: Not on file   Number of children: 4   Years of education: 9   Highest education level: 9th grade  Occupational History   Occupation: Retired  Tobacco Use   Smoking status: Former    Packs/day: 1.00    Years: 25.00    Additional pack years: 0.00    Total pack years: 25.00    Types: Cigarettes    Quit date: 04/11/1984    Years since quitting: 38.2   Smokeless tobacco: Former    Quit date: 04/02/1985  Vaping Use   Vaping Use: Never used  Substance and Sexual Activity   Alcohol use: Not Currently    Comment: RARE   Drug use: No   Sexual activity: Not Currently  Other Topics Concern   Not on file  Social History Narrative   Lives alone. He had four children and one has deceased. He enjoys watching television.   Social Determinants of Health   Financial Resource Strain: Low Risk  (09/21/2021)   Overall Financial Resource Strain (CARDIA)    Difficulty of Paying Living Expenses: Not hard at all  Food Insecurity: No Food Insecurity (09/21/2021)   Hunger Vital Sign    Worried About Running Out of Food in the Last Year: Never true    Ran Out of Food in the Last Year: Never true  Transportation Needs: No Transportation Needs (09/21/2021)   PRAPARE - Radiographer, therapeutic (Medical): No    Lack of Transportation (Non-Medical): No  Physical Activity: Sufficiently Active (09/21/2021)   Exercise Vital Sign    Days of Exercise per Week: 5 days    Minutes of Exercise per Session: 40 min  Stress: No Stress Concern Present (09/21/2021)   [place]    Feeling of Stress : Not at all  Social Connections: Moderately Isolated (09/21/2021)   Social Connection and Isolation Panel [NHANES]    Frequency of Communication with Friends and Family: More than three times a week    Frequency of Social Gatherings with Friends and Family: Twice a week    Attends Religious Services: More than 4 times per year    Active Member of [employer] or Organizations: No    Attends Archivist Meetings: Never    Marital Status: Widowed     Family History: The patient's family history includes Cancer in his father, maternal uncle, and mother; Diabetes in his father; Multiple sclerosis in his daughter.  ROS:   Please see the history of present illness.    All other systems reviewed and are negative.  EKGs/Labs/Other Studies Reviewed:    EKG:  EKG is not ordered today.    Recent Labs: 04/28/2022: Hemoglobin 9.9; Platelets 272 05/24/2022: ALT 20; BUN 26; Creatinine, Ser 1.58; Potassium 5.4; Sodium 140; TSH 9.390   Recent Lipid Panel    Component Value Date/Time   CHOL 121 12/10/2021 0821   TRIG 146.0 12/10/2021 0821   HDL 37.40 (L) 12/10/2021 0821   CHOLHDL 3 12/10/2021 0821   VLDL 29.2 12/10/2021 0821   LDLCALC 54 12/10/2021 0821   LDLCALC 146 (H) 05/18/2020 0000   LDLDIRECT 132 (H) 10/24/2019 0807    Physical Exam:   VS:  BP (!) 150/82   Pulse (!) 58   Ht 6' (1.829 m)   Wt 229 lb (103.9 kg)   BMI 31.06 kg/m  , BMI Body mass index is 31.06 kg/m. GENERAL:  Well appearing, overweight HEENT: Pupils equal round and reactive, fundi not visualized, oral mucosa unremarkable NECK:  No jugular  venous distention, waveform within normal limits, carotid upstroke brisk and symmetric, no bruits, no thyromegaly LYMPHATICS:  No cervical adenopathy LUNGS:  Clear to auscultation bilaterally HEART:  RRR.  PMI not displaced or sustained,S1 and S2 within normal limits, no S3, no S4, no clicks, no rubs, no murmurs ABD:  Flat, positive bowel sounds normal in frequency in pitch, no bruits, no rebound, no guarding, no midline pulsatile mass, no hepatomegaly, no splenomegaly EXT:  2 plus pulses throughout, no edema, no cyanosis no clubbing SKIN:  No rashes no nodules.  Left elbow cyst soft, nontender 3" in diameter NEURO:  Cranial nerves II through XII grossly intact, motor grossly intact throughout PSYCH:  Cognitively intact, oriented to person place and time   ASSESSMENT/PLAN:    HTN - BP not at goal <130/80. Increase Hydralazine to 50mg  TID.  Reiterated need to check blood pressure after medications.  Continue Amlodipine 10mg  QD, Spironolactone 25mg  QD, Toprol 50mg  QD. Previously on Losartan which was stopped due to hyperkalemia per his report. Secondary workup thus far unremarkable. Heart healthy diet and regular cardiovascular exercise encouraged.   He does have an odd sleep schedule going to bed at 4pm, waking at 11pm, then nodding off/on until he gets up at 4am. Discussed sleep hygiene. Could consider sleep study at follow up if BP control remains difficult.   Hyperkalemia - 05/24/22 K 5.4 and spironolactone dose reduced to 12.5 mg daily.  Update BMP today for monitoring.  Left elbow cyst -1 week ago he hit elbow and has since developed what appears to be a fluid-filled left elbow cyst (likely bursal cyst). Is not painful.  See image under media tab.  Will reach out to PCP to see whether he thinks could be drained in clinic or if ultrasound required.  CKD - Careful titration of diuretic and antihypertensive.  Abnormal TSH -2/50/24 TSH 9.39, T4, 9.8, T3 24, free thyroxine index 2.4.  TSH  abnormal but remainder of thyroid labs are normal.  Repeat thyroid study already ordered for 53-month follow-up. Careful monitoring given amiodarone use.  CAD - Prior MI 27. Myoview 05/2021 low risk study. Stable with no anginal symptoms. No indication for ischemic evaluation.  Follows with Dr. Terrence [name]. Has discontinued statin, unclear why and not addressed at this clinic visit. Will need addressed at follow up.   PAF / On Amiodarone therapy - RRR by auscultation. Reports rare palpitations. Follows with Dr. Terrence [name]. Not on [place], unclear why and will route to Dr. Terrence [name] for advisement.  HLD, LDL goal < 70 - 12/2021 LDL 54. No longer on Rosuvasatin, unclear why and will address at follow up.   DM2 - Follows with endocrinology.  Obesity - Weight loss via diet and exercise encouraged. Discussed the impact being overweight would have on cardiovascular risk.Congratulated on 4 lb weight loss.  Screening for Secondary Hypertension:  Click here to document screening for secondary causes of HTN  :[id number]     05/19/2022    9:12 AM 06/30/2022    1:21 PM  Causes  Renovascular HTN Screened Screened     - Comments  Renal duplex no stenosis  Thyroid Disease Screened Screened     - Comments  abnormal TSH but T3/T4 normal  Hyperaldosteronism Not Screened      - Comments already on Arlyce Harman - could stop in future to assess if indicated   Pheochromocytoma Screened Screened     - Comments  normal catecholamines, metanephrines  Cushing's Syndrome Screened Screened     - Comments  normal cortisol  Coarctation of the Aorta N/A      - Comments symmetrical  BP   Compliance Screened     Relevant Labs/Studies:    Latest Ref Rng & Units 05/24/2022    7:08 AM 04/28/2022    8:40 AM 12/10/2021    8:21 AM  Basic Labs  Sodium 134 - 144 mmol/L 140  141  138   Potassium 3.5 - 5.2 mmol/L 5.4  4.8  4.8   Creatinine 0.76 - 1.27 mg/dL 1.58  1.59  1.63        Latest Ref Rng & Units 05/24/2022    7:08 AM 10/23/2018    10:18 AM  Thyroid   TSH 0.450 - 4.500 uIU/mL 9.390  1.72           Latest Ref Rng & Units 05/24/2022    7:08 AM  Metanephrines/Catecholamines   Epinephrine 0 - 62 pg/mL 46   Norepinephrine 0 - 874 pg/mL 1,042   Dopamine 0 - 48 pg/mL <30   Metanephrines 0.0 - 88.0 pg/mL 38.8   Normetanephrines  0.0 - 285.2 pg/mL 164.5           06/10/2022    8:45 AM  Renovascular   Renal Artery [place] Completed Yes     Disposition:    FU with MD/PharmD/APP in 6-8 weeks   Medication Adjustments/Labs and Tests Ordered: Current medicines are reviewed at length with the patient today.  Concerns regarding medicines are outlined above.  No orders of the defined types were placed in this encounter.  Meds ordered this encounter  Medications   hydrALAZINE (APRESOLINE) 50 MG tablet    Sig: Take 1 tablet (50 mg total) by mouth 3 (three) times daily.    Dispense:  270 tablet    Refill:  3     Signed, Loel Dubonnet, NP  06/30/2022 1:21 PM    [facility]

## 2022-07-01 ENCOUNTER — Telehealth (HOSPITAL_BASED_OUTPATIENT_CLINIC_OR_DEPARTMENT_OTHER): Payer: Self-pay

## 2022-07-01 DIAGNOSIS — I10 Essential (primary) hypertension: Secondary | ICD-10-CM

## 2022-07-01 LAB — BASIC METABOLIC PANEL
BUN/Creatinine Ratio: 20 (ref 10–24)
BUN: 42 mg/dL — ABNORMAL HIGH (ref 8–27)
CO2: 16 mmol/L — ABNORMAL LOW (ref 20–29)
Calcium: 9.2 mg/dL (ref 8.6–10.2)
Chloride: 107 mmol/L — ABNORMAL HIGH (ref 96–106)
Creatinine, Ser: 2.1 mg/dL — ABNORMAL HIGH (ref 0.76–1.27)
Glucose: 101 mg/dL — ABNORMAL HIGH (ref 70–99)
Potassium: 5.7 mmol/L — ABNORMAL HIGH (ref 3.5–5.2)
Sodium: 138 mmol/L (ref 134–144)
eGFR: 32 mL/min/{1.73_m2} — ABNORMAL LOW (ref >59)

## 2022-07-01 LAB — THYROID PANEL WITH TSH
Free Thyroxine Index: 2.9 (ref 1.2–4.9)
T3 Uptake Ratio: 26 % (ref 24–39)
T4, Total: 11.1 ug/dL (ref 4.5–12.0)
TSH: 12.2 u[IU]/mL — ABNORMAL HIGH (ref 0.450–4.500)

## 2022-07-01 NOTE — Telephone Encounter (Addendum)
Results called to patient who verbalizes understanding! Repeat labs ordered, BMP lab slips to be placed at front desk for patient to pick up and repeat thyroid panel will be mailed to patient to avoid confusion on Monday lab draw. Arlyce Harman removed from [employer].     ----- Message from Loel Dubonnet, NP sent at 07/01/2022  7:37 AM EDT ----- Thyroid panel shows elevated TSH but T3 and T4 remain normal.  This was supposed to be collected in 3 months but appears was collected early.  Recommend repeat thyroid panel in 3 months.  Kidney function shows significant decrease in function as well as elevated potassium. Ensure avoiding salt substitutes, avoiding electrolyte drinks.  Stop spironolactone.  Needs to significantly increase hydration.  Repeat BMP in Monday or Tuesday for monitoring of renal function and potassium.

## 2022-07-04 DIAGNOSIS — I10 Essential (primary) hypertension: Secondary | ICD-10-CM | POA: Diagnosis not present

## 2022-07-04 LAB — BASIC METABOLIC PANEL
BUN/Creatinine Ratio: 16 (ref 10–24)
BUN: 30 mg/dL — ABNORMAL HIGH (ref 8–27)
CO2: 16 mmol/L — ABNORMAL LOW (ref 20–29)
Calcium: 8.7 mg/dL (ref 8.6–10.2)
Chloride: 108 mmol/L — ABNORMAL HIGH (ref 96–106)
Creatinine, Ser: 1.85 mg/dL — ABNORMAL HIGH (ref 0.76–1.27)
Glucose: 114 mg/dL — ABNORMAL HIGH (ref 70–99)
Potassium: 5.4 mmol/L — ABNORMAL HIGH (ref 3.5–5.2)
Sodium: 138 mmol/L (ref 134–144)
eGFR: 37 mL/min/{1.73_m2} — ABNORMAL LOW (ref >59)

## 2022-07-06 ENCOUNTER — Telehealth (HOSPITAL_BASED_OUTPATIENT_CLINIC_OR_DEPARTMENT_OTHER): Payer: Self-pay

## 2022-07-06 NOTE — Telephone Encounter (Signed)
-----   Message from Loel Dubonnet, NP sent at 07/06/2022 10:18 AM EDT ----- Kidney function improved from previous. However, potassium remains mildly elevated. Recommend initiation of Lokelma 10mg  daily with repeat BMP in one week. Elevated potassium likely due to his kidney function as we hav estopped all medications that could contribute. Be sure not taking over the counter potassium supplement and avoiding electrolyte drinks, salt substitute.

## 2022-07-06 NOTE — Telephone Encounter (Signed)
Attempted to call patient, went directly to voicemail.  Message left requesting return call.  Georgana Curio MHA RN CCM

## 2022-07-07 MED ORDER — LOKELMA 10 G PO PACK: 10.0000 g | PACK | Freq: Every day | ORAL | 2 refills | Status: DC

## 2022-07-07 NOTE — Addendum Note (Signed)
Addended by: Georgana Curio D on: 07/07/2022 09:01 AM   Modules accepted: Orders

## 2022-07-07 NOTE — Telephone Encounter (Signed)
Pt returned call, provided lab results and follow up instructions.  Pt. Verbalized understanding of instructions as given. Orders entered for lab work in 1 week.  Prescription for lokelma sent to pharmacy.   Georgana Curio MHA RN CCM

## 2022-07-14 ENCOUNTER — Telehealth: Payer: Self-pay | Admitting: Family

## 2022-07-14 MED ORDER — LOKELMA 10 G PO PACK: 10.0000 g | PACK | Freq: Every day | ORAL | 0 refills | Status: AC

## 2022-07-14 NOTE — Telephone Encounter (Signed)
Pt c/o medication issue:  1. Name of Medication: sodium zirconium cyclosilicate (LOKELMA) 10 g PACK packet   2. How are you currently taking this medication (dosage and times per day)? Not currently taking  3. Are you having a reaction (difficulty breathing--STAT)? no  4. What is your medication issue? Patient's daughter calling to follow up on the medication. She says the patient has not received the prescription yet and is supposed to have lab work a week after starting it. She requests the call go back to the patient at [phone]. She says if there are any questions for her she can be reached at [phone].

## 2022-07-14 NOTE — Telephone Encounter (Signed)
Results given and documented in separate encounter via Georgana Curio

## 2022-07-14 NOTE — Telephone Encounter (Signed)
Left message for patient to call back   "Please provide 7 sample packets of Lokelma.  He should take one packet daily for 3 days, then have repeat BMP done within the next 1-3 days. We can determine based on lab work whether we need to use additional packets.  If a persistent issue, we can reach out to nephrology about alternate potassium binder.    Loel Dubonnet, NP"

## 2022-07-14 NOTE — Addendum Note (Signed)
Addended by: Gerald Stabs on: 07/14/2022 04:46 PM   Modules accepted: Orders

## 2022-07-14 NOTE — Telephone Encounter (Signed)
Patient returned call to the patient, provided the following recommendations.  Patient is agreeable to coming to get samples and doing labs. Samples placed up front.      "Please provide 7 sample packets of Lokelma.  He should take one packet daily for 3 days, then have repeat BMP done within the next 1-3 days. We can determine based on lab work whether we need to use additional packets.  If a persistent issue, we can reach out to nephrology about alternate potassium binder.    Loel Dubonnet, NP"

## 2022-07-14 NOTE — Telephone Encounter (Signed)
Per record, "E-Prescribing Status: Receipt confirmed by pharmacy (07/07/2022  9:00 AM EDT)"   Calling pharmacy to confirm if prescription is ready for pick up by patient. Per pharmacy drug is not covered under patients formulary.   Routing to provider as patient may need updated labs before new medications can be prescribed.

## 2022-07-14 NOTE — Telephone Encounter (Signed)
Please provide 7 sample packets of Lokelma.  He should take one packet daily for 3 days, then have repeat BMP done within the next 1-3 days. We can determine based on lab work whether we need to use additional packets.  If a persistent issue, we can reach out to nephrology about alternate potassium binder.   Loel Dubonnet, NP

## 2022-07-15 ENCOUNTER — Other Ambulatory Visit: Payer: Self-pay | Admitting: Family Medicine

## 2022-07-15 DIAGNOSIS — I10 Essential (primary) hypertension: Secondary | ICD-10-CM

## 2022-07-15 NOTE — Telephone Encounter (Signed)
We had stopped it due to persistent hyperkalemia.  His cardiologist kept adding this back on.  I would have him contact his cardiologist to see if he wants him to continue.    CM

## 2022-07-16 ENCOUNTER — Other Ambulatory Visit: Payer: Self-pay | Admitting: Family Medicine

## 2022-07-26 DIAGNOSIS — I10 Essential (primary) hypertension: Secondary | ICD-10-CM | POA: Diagnosis not present

## 2022-07-27 ENCOUNTER — Telehealth (HOSPITAL_BASED_OUTPATIENT_CLINIC_OR_DEPARTMENT_OTHER): Payer: Self-pay | Admitting: *Deleted

## 2022-07-27 DIAGNOSIS — I10 Essential (primary) hypertension: Secondary | ICD-10-CM

## 2022-07-27 LAB — THYROID PANEL WITH TSH
Free Thyroxine Index: 3.1 (ref 1.2–4.9)
T3 Uptake Ratio: 26 % (ref 24–39)
T4, Total: 11.9 ug/dL (ref 4.5–12.0)
TSH: 9.42 u[IU]/mL — ABNORMAL HIGH (ref 0.450–4.500)

## 2022-07-27 NOTE — Telephone Encounter (Signed)
-----   Message from Alver Sorrow, NP sent at 07/27/2022  7:58 AM EDT ----- TSH mildly elevated but improved from previous. Normal T3/T4. Indicative of subclinical hypothyroidism, no indication for treatment.   Labs are overall stable. Good result.

## 2022-07-27 NOTE — Telephone Encounter (Signed)
Reviewed labs with patient  Per patient since stopping his fluid pill, thinks Spironolactone has noticed some swelling in is feet and legs Also having some increased shortness of breath  Advised would discuss with [name] when she returns to office tomorrow   Will forward for review

## 2022-07-28 MED ORDER — FUROSEMIDE 20 MG PO TABS: 20.0000 mg | ORAL_TABLET | Freq: Every day | ORAL | 3 refills | Status: DC

## 2022-07-28 NOTE — Telephone Encounter (Signed)
May Rx Furosemide 20mg  QD.   Unfortunately he was supposed to have BMP done for labs and instead they did his thyroid panel early. He can simply do a repeat BMP in one week after starting Furosemide.   Furosemide is a fluid pill that will lower potassium rather than raise it.  Alver Sorrow, NP

## 2022-07-28 NOTE — Telephone Encounter (Signed)
Returned call to patient, provided the following information. He is agreeable to starting new medication, labs have been printed and mailed to patient.      "May Rx Furosemide 20mg  QD.    Unfortunately he was supposed to have BMP done for labs and instead they did his thyroid panel early. He can simply do a repeat BMP in one week after starting Furosemide.    Furosemide is a fluid pill that will lower potassium rather than raise it.   Alver Sorrow, NP"

## 2022-08-02 ENCOUNTER — Ambulatory Visit (INDEPENDENT_AMBULATORY_CARE_PROVIDER_SITE_OTHER): Payer: PPO | Admitting: Family Medicine

## 2022-08-02 ENCOUNTER — Encounter: Payer: Self-pay | Admitting: Family Medicine

## 2022-08-02 VITALS — BP 162/58 | HR 59 | Ht 72.0 in | Wt 232.0 lb

## 2022-08-02 DIAGNOSIS — R600 Localized edema: Secondary | ICD-10-CM | POA: Diagnosis not present

## 2022-08-02 DIAGNOSIS — N1832 Chronic kidney disease, stage 3b: Secondary | ICD-10-CM | POA: Diagnosis not present

## 2022-08-02 DIAGNOSIS — I159 Secondary hypertension, unspecified: Secondary | ICD-10-CM | POA: Diagnosis not present

## 2022-08-02 DIAGNOSIS — I4891 Unspecified atrial fibrillation: Secondary | ICD-10-CM | POA: Diagnosis not present

## 2022-08-02 DIAGNOSIS — E1122 Type 2 diabetes mellitus with diabetic chronic kidney disease: Secondary | ICD-10-CM | POA: Diagnosis not present

## 2022-08-02 DIAGNOSIS — M542 Cervicalgia: Secondary | ICD-10-CM | POA: Diagnosis not present

## 2022-08-02 MED ORDER — FREESTYLE LIBRE 3 SENSOR MISC: 3 refills | Status: DC

## 2022-08-02 NOTE — Assessment & Plan Note (Signed)
Well controlled.  Managed by endocrinology.

## 2022-08-02 NOTE — Assessment & Plan Note (Signed)
BP is elevated today.  Readings are better at home.  He will continue to follow with advanced HTN clinic.  Has upcoming renal duplex US.

## 2022-08-02 NOTE — Progress Notes (Signed)
[name] - 77 y.o. male MRN [id number]  Date of birth: 11/05/1945  Subjective Chief Complaint  Patient presents with   Hypertension    HPI [name] is a 77 y.o. male here today for follow up visit.   He reports that he is doing pretty well.  Continues to see ID and remains on levaquin indefinitely for chronic hip prosthesis infection.    BP is elevated today in clinic.  Readings at home have been better.   He is feeling pretty well.  He is seeing cardiology and remains on amiodarone for this.  Referred to advanced hypertension clinic as well.  Hydralazine recently increased.  Remains on amlodipine, aldactone and toprol.  He had hyperkalemia with losartan.  Renal US has been ordered.    Diabetes is managed by endocrinology.  He is doing well with current medications of prandin.  Last A1c indicates blood sugars are well controlled.  He is having some mild neck pain on the R side.  It started a few days ago and has improved since onset.  No radiation into arm.   ROS:  A comprehensive ROS was completed and negative except as noted per HPI  Allergies  Allergen Reactions   Shellfish Allergy Rash    Past Medical History:  Diagnosis Date   Arthritis    Asthma    no inhaler   Atrial fibrillation 10/16/2020   CKD (chronic kidney disease) 04/13/2020   Coronary artery disease    cardiologist-  dr spruill; last visit 3 mos ago per pt   Enterobacter sepsis 04/13/2020   GERD (gastroesophageal reflux disease)    History of MI (myocardial infarction)    1985   Hydronephrosis, left    Hypertension    Myocardial infarction    Nephrolithiasis    left   Neuromuscular disorder    TINGLING IN BOTH HANDS   Presence of tooth-root and mandibular implants    lower dental implants   Prostate cancer    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   QT prolongation 10/16/2020   Shortness of breath    WITH EXERTION   Thigh abscess 04/13/2020   Type 2 diabetes mellitus    Vaccine  counseling 04/28/2022   Zoster 09/13/2021    Past Surgical History:  Procedure Laterality Date   BUNIONECTOMY     CYSTOSCOPY W/ RETROGRADES Left 03/14/2014   Procedure: CYSTOSCOPY WITH LEFT RETROGRADE PYELOGRAM, Left Ureteroscopy, [place] ureteral Stent No string;  Surgeon: Danae Chen, MD;  Location: [facility] [place];  Service: Urology;  Laterality: Left;   CYSTOSCOPY W/ URETERAL STENT PLACEMENT Bilateral 01/15/2020   Procedure: CYSTOSCOPY WITH RETROGRADE PYELOGRAM/URETERAL STENT PLACEMENT;  Surgeon: Sebastian Ache, MD;  Location: [facility];  Service: Urology;  Laterality: Bilateral;   CYSTOSCOPY/URETEROSCOPY/HOLMIUM LASER/STENT PLACEMENT Bilateral 03/17/2020   Procedure: CYSTOSCOPY/URETEROSCOPY/HOLMIUM LASER/STENT LEFT PLACEMENT;  Surgeon: Jerilee Field, MD;  Location: [facility];  Service: Urology;  Laterality: Bilateral;   HERNIA REPAIR     INCISION AND DRAINAGE Left 03/09/2020   Procedure: INCISION AND DRAINAGE LEFT HIP WITH LINER EXCHANGE;  Surgeon: Ollen Gross, MD;  Location: [facility];  Service: Orthopedics;  Laterality: Left;   IR FLUORO GUIDE CV LINE RIGHT  03/10/2020   IR RADIOLOGIST EVAL & MGMT  04/01/2020   IR RADIOLOGIST EVAL & MGMT  04/15/2020   IR RADIOLOGIST EVAL & MGMT  04/30/2020   IR REMOVAL TUN CV CATH W/O FL  04/22/2020   IR US GUIDE BX ASP/DRAIN  03/17/2020   IR  US GUIDE VASC ACCESS RIGHT  03/10/2020   PROSTATE BIOPSY     TOTAL HIP ARTHROPLASTY Left 03-20-2009   TOTAL HIP ARTHROPLASTY  04/09/2012   Procedure: TOTAL HIP ARTHROPLASTY;  Surgeon: Loanne Drilling, MD;  Location: [facility];  Service: Orthopedics;  Laterality: Right;   TOTAL KNEE ARTHROPLASTY Left 05-16-2008    Social History   Socioeconomic History   Marital status: Widowed    Spouse name: Not on file   Number of children: 4   Years of education: 9   Highest education level: 9th grade  Occupational History   Occupation: Retired  Tobacco Use   Smoking status: Former    Packs/day: 1.00    Years:  25.00    Additional pack years: 0.00    Total pack years: 25.00    Types: Cigarettes    Quit date: 04/11/1984    Years since quitting: 38.3   Smokeless tobacco: Former    Quit date: 04/02/1985  Vaping Use   Vaping Use: Never used  Substance and Sexual Activity   Alcohol use: Not Currently    Comment: RARE   Drug use: No   Sexual activity: Not Currently  Other Topics Concern   Not on file  Social History Narrative   Lives alone. He had four children and one has deceased. He enjoys watching television.   Social Determinants of Health   Financial Resource Strain: Low Risk  (09/21/2021)   Overall Financial Resource Strain (CARDIA)    Difficulty of Paying Living Expenses: Not hard at all  Food Insecurity: No Food Insecurity (09/21/2021)   Hunger Vital Sign    Worried About Running Out of Food in the Last Year: Never true    Ran Out of Food in the Last Year: Never true  Transportation Needs: No Transportation Needs (09/21/2021)   PRAPARE - Administrator, Civil Service (Medical): No    Lack of Transportation (Non-Medical): No  Physical Activity: Sufficiently Active (09/21/2021)   Exercise Vital Sign    Days of Exercise per Week: 5 days    Minutes of Exercise per Session: 40 min  Stress: No Stress Concern Present (09/21/2021)   [employer] of Occupational Health - Occupational Stress Questionnaire    Feeling of Stress : Not at all  Social Connections: Moderately Isolated (09/21/2021)   Social Connection and Isolation Panel [NHANES]    Frequency of Communication with Friends and Family: More than three times a week    Frequency of Social Gatherings with Friends and Family: Twice a week    Attends Religious Services: More than 4 times per year    Active Member of [employer] or Organizations: No    Attends Banker Meetings: Never    Marital Status: Widowed    Family History  Problem Relation Age of Onset   Cancer Mother        breast   Multiple sclerosis  Daughter    Cancer Father        prostate   Diabetes Father    Cancer Maternal Uncle        bone    Health Maintenance  Topic Date Due   Medicare Annual Wellness (AWV)  09/22/2022   COVID-19 Vaccine (3 - Mixed Product risk series) 05/20/2023 (Originally 04/07/2021)   Zoster Vaccines- Shingrix (1 of 2) 08/02/2023 (Originally 08/05/1964)   OPHTHALMOLOGY EXAM  10/06/2022   HEMOGLOBIN A1C  10/18/2022   INFLUENZA VACCINE  11/10/2022   Diabetic kidney evaluation - Urine ACR  12/11/2022   Diabetic kidney evaluation - eGFR measurement  07/04/2023   FOOT EXAM  08/02/2023   DTaP/Tdap/Td (2 - Td or Tdap) 04/10/2025   Pneumonia Vaccine 16+ Years old  Completed   Hepatitis C Screening  Completed   HPV VACCINES  Aged Out   COLONOSCOPY (Pts 45-31yrs Insurance coverage will need to be confirmed)  Discontinued     ----------------------------------------------------------------------------------------------------------------------------------------------------------------------------------------------------------------- Physical Exam BP (!) 162/58 (BP Location: Right Arm, Patient Position: Sitting, Cuff Size: Large)   Pulse (!) 59   Ht 6' (1.829 m)   Wt 232 lb (105.2 kg)   SpO2 98%   BMI 31.46 kg/m   Physical Exam Constitutional:      Appearance: Normal appearance.  HENT:     Head: Normocephalic and atraumatic.  Eyes:     General: No scleral icterus. Cardiovascular:     Rate and Rhythm: Normal rate and regular rhythm.  Pulmonary:     Effort: Pulmonary effort is normal.     Breath sounds: Normal breath sounds.  Musculoskeletal:     Cervical back: Neck supple.  Neurological:     Mental Status: He is alert.  Psychiatric:        Mood and Affect: Mood normal.        Behavior: Behavior normal.      ------------------------------------------------------------------------------------------------------------------------------------------------------------------------------------------------------------------- Assessment and Plan  Atrial fibrillation (HCC) Continue on amiodarone.  Managed by cardiology.    HTN (hypertension) BP is elevated today.  Readings are better at home.  He will continue to follow with advanced HTN clinic.  Has upcoming renal duplex US.    Type 2 diabetes mellitus with chronic kidney disease Well controlled.  Managed by endocrinology.   Lower extremity edema Chronic in nature.  Likely related to chronic infection of hip affecting lymphatic drainage, venous insufficiency and vasodilating BP medications.  No pain noted.    CKD (chronic kidney disease) Renal function remains stable.   Neck pain Improving.  Given handout for HEP.     No orders of the defined types were placed in this encounter.   No follow-ups on file.    This visit occurred during the SARS-CoV-2 public health emergency.  Safety protocols were in place, including screening questions prior to the visit, additional usage of staff PPE, and extensive cleaning of exam room while observing appropriate contact time as indicated for disinfecting solutions.

## 2022-08-02 NOTE — Assessment & Plan Note (Signed)
Chronic in nature.  Likely related to chronic infection of hip affecting lymphatic drainage, venous insufficiency and vasodilating BP medications.  No pain noted.

## 2022-08-02 NOTE — Assessment & Plan Note (Signed)
Renal function remains stable.  

## 2022-08-02 NOTE — Assessment & Plan Note (Signed)
Improving.  Given handout for HEP.

## 2022-08-02 NOTE — Assessment & Plan Note (Signed)
Continue on amiodarone.  Managed by cardiology.

## 2022-08-08 DIAGNOSIS — I10 Essential (primary) hypertension: Secondary | ICD-10-CM | POA: Diagnosis not present

## 2022-08-08 LAB — BASIC METABOLIC PANEL
BUN/Creatinine Ratio: 17 (ref 10–24)
BUN: 35 mg/dL — ABNORMAL HIGH (ref 8–27)
CO2: 16 mmol/L — ABNORMAL LOW (ref 20–29)
Calcium: 8.9 mg/dL (ref 8.6–10.2)
Chloride: 108 mmol/L — ABNORMAL HIGH (ref 96–106)
Creatinine, Ser: 2.09 mg/dL — ABNORMAL HIGH (ref 0.76–1.27)
Glucose: 108 mg/dL — ABNORMAL HIGH (ref 70–99)
Potassium: 5.3 mmol/L — ABNORMAL HIGH (ref 3.5–5.2)
Sodium: 140 mmol/L (ref 134–144)
eGFR: 32 mL/min/{1.73_m2} — ABNORMAL LOW (ref >59)

## 2022-08-09 ENCOUNTER — Telehealth (HOSPITAL_BASED_OUTPATIENT_CLINIC_OR_DEPARTMENT_OTHER): Payer: Self-pay

## 2022-08-09 DIAGNOSIS — E875 Hyperkalemia: Secondary | ICD-10-CM

## 2022-08-09 DIAGNOSIS — N289 Disorder of kidney and ureter, unspecified: Secondary | ICD-10-CM

## 2022-08-09 NOTE — Telephone Encounter (Addendum)
Left message for patient to call back     ----- Message from Alver Sorrow, NP sent at 08/09/2022  5:03 PM EDT ----- Kidney function decreased from previous.  Potassium remains elevated.  Unfortunately his insurance does not cover Lokelma.  He does have additional sample packets of the Lokelma.  Recommend he take the 5 remaining packets - taking one packet daily over the next 5 days.  Recommend he follow-up with nephrology regarding renal function and persistent hyperkalemia.  Please inquire which nephrologist he sees and we can route results to them.

## 2022-08-10 MED ORDER — LOKELMA 10 G PO PACK: 10.0000 g | PACK | Freq: Every day | ORAL | 0 refills | Status: DC

## 2022-08-10 NOTE — Addendum Note (Signed)
Addended by: Regis Bill B on: 08/10/2022 09:58 AM   Modules accepted: Orders

## 2022-08-10 NOTE — Telephone Encounter (Signed)
Patient is returning call.  °

## 2022-08-10 NOTE — Telephone Encounter (Signed)
Advised patient, verbalized understanding  Patient does not have nephrologist, referral placed and will fax records  He will come to office today to get [facility]

## 2022-08-17 ENCOUNTER — Other Ambulatory Visit (HOSPITAL_BASED_OUTPATIENT_CLINIC_OR_DEPARTMENT_OTHER): Payer: Self-pay | Admitting: *Deleted

## 2022-08-17 ENCOUNTER — Other Ambulatory Visit: Payer: PPO

## 2022-08-17 DIAGNOSIS — E875 Hyperkalemia: Secondary | ICD-10-CM

## 2022-08-17 LAB — BASIC METABOLIC PANEL
BUN/Creatinine Ratio: 20 (ref 10–24)
BUN: 39 mg/dL — ABNORMAL HIGH (ref 8–27)
CO2: 20 mmol/L (ref 20–29)
Calcium: 8.6 mg/dL (ref 8.6–10.2)
Chloride: 108 mmol/L — ABNORMAL HIGH (ref 96–106)
Creatinine, Ser: 1.94 mg/dL — ABNORMAL HIGH (ref 0.76–1.27)
Glucose: 86 mg/dL (ref 70–99)
Potassium: 4.2 mmol/L (ref 3.5–5.2)
Sodium: 140 mmol/L (ref 134–144)
eGFR: 35 mL/min/{1.73_m2} — ABNORMAL LOW (ref >59)

## 2022-08-18 ENCOUNTER — Ambulatory Visit: Payer: PPO | Admitting: "Endocrinology

## 2022-08-19 ENCOUNTER — Encounter: Payer: Self-pay | Admitting: "Endocrinology

## 2022-08-19 ENCOUNTER — Ambulatory Visit (INDEPENDENT_AMBULATORY_CARE_PROVIDER_SITE_OTHER): Payer: PPO | Admitting: "Endocrinology

## 2022-08-19 VITALS — BP 142/60 | HR 61 | Ht 72.0 in | Wt 231.6 lb

## 2022-08-19 DIAGNOSIS — E7800 Pure hypercholesterolemia, unspecified: Secondary | ICD-10-CM

## 2022-08-19 DIAGNOSIS — E11649 Type 2 diabetes mellitus with hypoglycemia without coma: Secondary | ICD-10-CM

## 2022-08-19 DIAGNOSIS — Z7984 Long term (current) use of oral hypoglycemic drugs: Secondary | ICD-10-CM

## 2022-08-19 DIAGNOSIS — E038 Other specified hypothyroidism: Secondary | ICD-10-CM

## 2022-08-19 LAB — POCT GLYCOSYLATED HEMOGLOBIN (HGB A1C): Hemoglobin A1C: 5.9 % — AB (ref 4.0–5.6)

## 2022-08-19 MED ORDER — LEVOTHYROXINE SODIUM 75 MCG PO TABS: 75.0000 ug | ORAL_TABLET | Freq: Every day | ORAL | 0 refills | Status: DC

## 2022-08-19 MED ORDER — EMPAGLIFLOZIN 10 MG PO TABS
10.0000 mg | ORAL_TABLET | Freq: Every day | ORAL | 2 refills | Status: DC
Start: 2022-08-19 — End: 2022-10-24

## 2022-08-19 NOTE — Progress Notes (Signed)
Outpatient Endocrinology Note Aaron Washburn, MD  08/19/22   [name] 10/05/1945 [id number]  Referring Provider: Everrett Coombe, DO Primary Care Provider: Everrett Coombe, DO Reason for consultation: Subjective   Assessment & Plan  Diagnoses and all orders for this visit:  Type 2 diabetes mellitus with hypoglycemia without coma, without long-term current use of insulin (HCC) -     POCT glycosylated hemoglobin (Hb A1C) -     Lipid panel; Future -     Microalbumin / creatinine urine ratio; Future  Long term (current) use of oral hypoglycemic drugs  Pure hypercholesterolemia  Subclinical hypothyroidism -     TSH; Future  Other orders -     empagliflozin (JARDIANCE) 10 MG TABS tablet; Take 1 tablet (10 mg total) by mouth daily before breakfast. -     levothyroxine (SYNTHROID) 75 MCG tablet; Take 1 tablet (75 mcg total) by mouth daily.    Diabetes complicated by neuropathy, nephropathy, CAD Hba1c goal less than 7.0, current Hba1c is 5.9 . Will recommend for the following change of medications to: Stop prandin 0.5 mg qd Continue januvia 50 mg qd Start Jardianec 10 mg qd if affordable and stop Januvia   No known contraindications to any of above medications No UTI  Hyperlipidemia -Last LDL at goal: 53 -on rosuvastatin 40 mg QD -Follow low fat diet and exercise   -Blood pressure goal <140/90 - Microalbumin/creatinine goal < 30 -Nor on ACE/ARB, due seeing nephrology -diet changes including salt restriction -limit eating outside -counseled BP targets per standards of diabetes care -Uncontrolled blood pressure can lead to retinopathy, nephropathy and cardiovascular and atherosclerotic heart disease  -Newly diagnosed subclinical hypothyroidism  TSH persistently 9-12, with normal Total T4 Start levothyroxine 75 mcg qam   Reviewed and counseled on: -A1C target -Blood sugar targets -Complications of uncontrolled diabetes  -Checking blood sugar before  meals and bedtime and bring log next visit -All medications with mechanism of action and side effects -Hypoglycemia management: rule of 15's, Glucagon Emergency Kit and medical alert ID -low-carb low-fat plate-method diet -At least 20 minutes of physical activity per day -Annual dilated retinal eye exam and foot exam -compliance and follow up needs -follow up as scheduled or earlier if problem gets worse  Call if blood sugar is less than 70 or consistently above 250    Take a 15 gm snack of carbohydrate at bedtime before you go to sleep if your blood sugar is less than 100.    If you are going to fast after midnight for a test or procedure, ask your physician for instructions on how to reduce/decrease your insulin dose.    Call if blood sugar is less than 70 or consistently above 250  -Treating a low sugar by rule of 15  (15 gms of sugar every 15 min until sugar is more than 70) If you feel your sugar is low, test your sugar to be sure If your sugar is low (less than 70), then take 15 grams of a fast acting Carbohydrate (3-4 glucose tablets or glucose gel or 4 ounces of juice or regular soda) Recheck your sugar 15 min after treating low to make sure it is more than 70 If sugar is still less than 70, treat again with 15 grams of carbohydrate          Don't drive the hour of hypoglycemia  If unconscious/unable to eat or drink by mouth, use glucagon injection or nasal spray baqsimi and call 911. Can  repeat again in 15 min if still unconscious.  Return in about 3 months (around 11/19/2022) for DM and thyroid, labs before next visit .   I have reviewed current medications, nurse's notes, allergies, vital signs, past medical and surgical history, family medical history, and social history for this encounter. Counseled patient on symptoms, examination findings, lab findings, imaging results, treatment decisions and monitoring and prognosis. The patient understood the recommendations and agrees  with the treatment plan. All questions regarding treatment plan were fully answered.  Aaron [place], MD  08/19/22    History of Present Illness [name] is a 77 y.o. year old male who presents for evaluation of Type 2 diabetes mellitus.  [name] was first diagnosed in 2001.   Diabetes education +  Currently taking, Non-insulin hypoglycemic drugs: Januvia 50 mg daily (gets samples for the year by another provider), Prandin 0.5 mg qam  Previous history:  He was previously on metformin which was stopped because of renal dysfunction Amaryl was added in 2013 starting at 4 mg daily Januvia was started in 2018  Side effects from medications: None  COMPLICATIONS +  MI , - Stroke -  retinopathy, last eye exam 2023 +  neuropathy +  nephropathy  BLOOD SUGAR DATA  CGM interpretation: At today's visit, we reviewed her CGM downloads. The full report is scanned in the media. Reviewing the CGM trends, BG well controlled except around mid day may have lows around 60s.   Physical Exam  BP (!) 142/60 (BP Location: Left Arm, Patient Position: Sitting, Cuff Size: Normal)   Pulse 61   Ht 6' (1.829 m)   Wt 231 lb 9.6 oz (105.1 kg)   SpO2 98%   BMI 31.41 kg/m    Constitutional: well developed, well nourished Head: normocephalic, atraumatic Eyes: sclera anicteric, no redness Neck: supple Lungs: normal respiratory effort Neurology: alert and oriented Skin: dry, no appreciable rashes Musculoskeletal: no appreciable defects Psychiatric: normal mood and affect Diabetic Foot Exam - Simple   No data filed      Current Medications Patient's Medications  New Prescriptions   EMPAGLIFLOZIN (JARDIANCE) 10 MG TABS TABLET    Take 1 tablet (10 mg total) by mouth daily before breakfast.   LEVOTHYROXINE (SYNTHROID) 75 MCG TABLET    Take 1 tablet (75 mcg total) by mouth daily.  Previous Medications   ALBUTEROL (VENTOLIN HFA) 108 (90 BASE) MCG/ACT INHALER    Inhale 2 puffs into  the lungs every 6 (six) hours as needed for wheezing or shortness of breath.   ALLOPURINOL (ZYLOPRIM) 100 MG TABLET    TAKE 1/2 TABLET BY MOUTH EVERY DAY   AMIODARONE (PACERONE) 200 MG TABLET    Take 200 mg by mouth daily. [name]   AMLODIPINE (NORVASC) 10 MG TABLET    TAKE 1 TABLET BY MOUTH EVERY DAY   ASPIRIN EC 81 MG TABLET    Take 81 mg by mouth daily. Swallow whole.   COLCHICINE 0.6 MG TABLET    TAKE 1 TABLET BY MOUTH EVERY DAY   CONTINUOUS GLUCOSE SENSOR (FREESTYLE LIBRE 3 SENSOR) MISC    USE 1 AS DIRECTED EVERY 14 DAYS TO CHECK GLUCOSE CONTINUOUSLY   DOCUSATE SODIUM (COLACE) 100 MG CAPSULE    Take 100 mg by mouth daily as needed for mild constipation.   FERROUS SULFATE 325 (65 FE) MG TABLET    Take 325 mg by mouth daily with breakfast.   FUROSEMIDE (LASIX) 20 MG TABLET    Take 1  tablet (20 mg total) by mouth daily.   HYDRALAZINE (APRESOLINE) 50 MG TABLET    Take 1 tablet (50 mg total) by mouth 3 (three) times daily.   LEVOFLOXACIN (LEVAQUIN) 500 MG TABLET    Take 1 tablet by mouth daily.   METOPROLOL SUCCINATE (TOPROL-XL) 50 MG 24 HR TABLET    Take 50 mg by mouth daily.   OMEPRAZOLE (PRILOSEC) 40 MG CAPSULE    Take 40 mg by mouth daily.   REPAGLINIDE (PRANDIN) 0.5 MG TABLET    TAKE 1 TABLET (0.5 MG TOTAL) BY MOUTH 3 (THREE) TIMES DAILY BEFORE MEALS.   ROSUVASTATIN (CRESTOR) 40 MG TABLET    Take 40 mg by mouth daily.   SITAGLIPTIN (JANUVIA) 50 MG TABLET    Take 1 tablet (50 mg total) by mouth daily.   SODIUM ZIRCONIUM CYCLOSILICATE (LOKELMA) 10 G PACK PACKET    Take 10 g by mouth daily.  Modified Medications   No medications on file  Discontinued Medications   No medications on file    Allergies Allergies  Allergen Reactions   Shellfish Allergy Rash    Past Medical History Past Medical History:  Diagnosis Date   Arthritis    Asthma    no inhaler   Atrial fibrillation (HCC) 10/16/2020   CKD (chronic kidney disease) 04/13/2020   Coronary artery disease    cardiologist-   dr spruill; last visit 3 mos ago per pt   Enterobacter sepsis (HCC) 04/13/2020   GERD (gastroesophageal reflux disease)    History of MI (myocardial infarction)    1985   Hydronephrosis, left    Hypertension    Myocardial infarction (HCC)    Nephrolithiasis    left   Neuromuscular disorder (HCC)    TINGLING IN BOTH HANDS   Presence of tooth-root and mandibular implants    lower dental implants   Prostate cancer (HCC)    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   QT prolongation 10/16/2020   Shortness of breath    WITH EXERTION   Thigh abscess 04/13/2020   Type 2 diabetes mellitus (HCC)    Vaccine counseling 04/28/2022   Zoster 09/13/2021    Past Surgical History Past Surgical History:  Procedure Laterality Date   BUNIONECTOMY     CYSTOSCOPY W/ RETROGRADES Left 03/14/2014   Procedure: CYSTOSCOPY WITH LEFT RETROGRADE PYELOGRAM, Left Ureteroscopy, [place] ureteral Stent No string;  Surgeon: Danae Chen, MD;  Location: [facility];  Service: Urology;  Laterality: Left;   CYSTOSCOPY W/ URETERAL STENT PLACEMENT Bilateral 01/15/2020   Procedure: CYSTOSCOPY WITH RETROGRADE PYELOGRAM/URETERAL STENT PLACEMENT;  Surgeon: Sebastian Ache, MD;  Location: [facility];  Service: Urology;  Laterality: Bilateral;   CYSTOSCOPY/URETEROSCOPY/HOLMIUM LASER/STENT PLACEMENT Bilateral 03/17/2020   Procedure: CYSTOSCOPY/URETEROSCOPY/HOLMIUM LASER/STENT LEFT PLACEMENT;  Surgeon: Jerilee Field, MD;  Location: [facility];  Service: Urology;  Laterality: Bilateral;   HERNIA REPAIR     INCISION AND DRAINAGE Left 03/09/2020   Procedure: INCISION AND DRAINAGE LEFT HIP WITH LINER EXCHANGE;  Surgeon: Ollen Gross, MD;  Location: [facility];  Service: Orthopedics;  Laterality: Left;   IR FLUORO GUIDE CV LINE RIGHT  03/10/2020   IR RADIOLOGIST EVAL & MGMT  04/01/2020   IR RADIOLOGIST EVAL & MGMT  04/15/2020   IR RADIOLOGIST EVAL & MGMT  04/30/2020   IR REMOVAL TUN CV CATH W/O FL  04/22/2020   IR US GUIDE BX  ASP/DRAIN  03/17/2020   IR US GUIDE VASC ACCESS RIGHT  03/10/2020   PROSTATE BIOPSY  TOTAL HIP ARTHROPLASTY Left 03-20-2009   TOTAL HIP ARTHROPLASTY  04/09/2012   Procedure: TOTAL HIP ARTHROPLASTY;  Surgeon: Loanne Drilling, MD;  Location: [facility];  Service: Orthopedics;  Laterality: Right;   TOTAL KNEE ARTHROPLASTY Left 05-16-2008    Family History family history includes Cancer in his father, maternal uncle, and mother; Diabetes in his father; Multiple sclerosis in his daughter.  Social History Social History   Socioeconomic History   Marital status: Widowed    Spouse name: Not on file   Number of children: 4   Years of education: 9   Highest education level: 9th grade  Occupational History   Occupation: Retired  Tobacco Use   Smoking status: Former    Packs/day: 1.00    Years: 25.00    Additional pack years: 0.00    Total pack years: 25.00    Types: Cigarettes    Quit date: 04/11/1984    Years since quitting: 38.3   Smokeless tobacco: Former    Quit date: 04/02/1985  Vaping Use   Vaping Use: Never used  Substance and Sexual Activity   Alcohol use: Not Currently    Comment: RARE   Drug use: No   Sexual activity: Not Currently  Other Topics Concern   Not on file  Social History Narrative   Lives alone. He had four children and one has deceased. He enjoys watching television.   Social Determinants of Health   Financial Resource Strain: Low Risk  (09/21/2021)   Overall Financial Resource Strain (CARDIA)    Difficulty of Paying Living Expenses: Not hard at all  Food Insecurity: No Food Insecurity (09/21/2021)   Hunger Vital Sign    Worried About Running Out of Food in the Last Year: Never true    Ran Out of Food in the Last Year: Never true  Transportation Needs: No Transportation Needs (09/21/2021)   PRAPARE - Administrator, Civil Service (Medical): No    Lack of Transportation (Non-Medical): No  Physical Activity: Sufficiently Active (09/21/2021)    Exercise Vital Sign    Days of Exercise per Week: 5 days    Minutes of Exercise per Session: 40 min  Stress: No Stress Concern Present (09/21/2021)   [employer] of Occupational Health - Occupational Stress Questionnaire    Feeling of Stress : Not at all  Social Connections: Moderately Isolated (09/21/2021)   Social Connection and Isolation Panel [NHANES]    Frequency of Communication with Friends and Family: More than three times a week    Frequency of Social Gatherings with Friends and Family: Twice a week    Attends Religious Services: More than 4 times per year    Active Member of [employer] or Organizations: No    Attends Banker Meetings: Never    Marital Status: Widowed  Intimate Partner Violence: Not At Risk (09/21/2021)   Humiliation, Afraid, Rape, and Kick questionnaire    Fear of Current or Ex-Partner: No    Emotionally Abused: No    Physically Abused: No    Sexually Abused: No    Lab Results  Component Value Date   HGBA1C 5.9 (A) 08/19/2022   Lab Results  Component Value Date   CHOL 121 12/10/2021   Lab Results  Component Value Date   HDL 37.40 (L) 12/10/2021   Lab Results  Component Value Date   LDLCALC 54 12/10/2021   Lab Results  Component Value Date   TRIG 146.0 12/10/2021   Lab Results  Component  Value Date   CHOLHDL 3 12/10/2021   Lab Results  Component Value Date   CREATININE 1.94 (H) 08/17/2022   Lab Results  Component Value Date   GFR 40.72 (L) 12/10/2021   Lab Results  Component Value Date   MICROALBUR 15.0 (H) 12/10/2021      Component Value Date/Time   NA 140 08/17/2022 0847   K 4.2 08/17/2022 0847   CL 108 (H) 08/17/2022 0847   CO2 20 08/17/2022 0847   GLUCOSE 86 08/17/2022 0847   GLUCOSE 95 04/28/2022 0840   BUN 39 (H) 08/17/2022 0847   CREATININE 1.94 (H) 08/17/2022 0847   CREATININE 1.59 (H) 04/28/2022 0840   CALCIUM 8.6 08/17/2022 0847   PROT 7.0 05/24/2022 0708   ALBUMIN 4.1 05/24/2022 0708   AST 19  05/24/2022 0708   ALT 20 05/24/2022 0708   ALKPHOS 65 05/24/2022 0708   BILITOT 0.2 05/24/2022 0708   GFRNONAA 45 (L) 05/08/2021 0420   GFRNONAA 58 (L) 10/16/2020 0916   GFRAA 67 10/16/2020 0916      Latest Ref Rng & Units 08/17/2022    8:47 AM 08/08/2022    8:35 AM 07/04/2022    8:26 AM  BMP  Glucose 70 - 99 mg/dL 86  161  096   BUN 8 - 27 mg/dL 39  35  30   Creatinine 0.76 - 1.27 mg/dL 0.45  4.09  8.11   BUN/Creat Ratio 10 - 24 20  17  16    Sodium 134 - 144 mmol/L 140  140  138   Potassium 3.5 - 5.2 mmol/L 4.2  5.3  5.4   Chloride 96 - 106 mmol/L 108  108  108   CO2 20 - 29 mmol/L 20  16  16    Calcium 8.6 - 10.2 mg/dL 8.6  8.9  8.7        Component Value Date/Time   WBC 3.7 (L) 04/28/2022 0840   RBC 2.98 (L) 04/28/2022 0840   HGB 9.9 (L) 04/28/2022 0840   HCT 29.4 (L) 04/28/2022 0840   PLT 272 04/28/2022 0840   MCV 98.7 04/28/2022 0840   MCH 33.2 (H) 04/28/2022 0840   MCHC 33.7 04/28/2022 0840   RDW 13.1 04/28/2022 0840   LYMPHSABS 1,169 04/28/2022 0840   MONOABS 0.6 08/01/2020 0837   EOSABS 137 04/28/2022 0840   BASOSABS 19 04/28/2022 0840     Parts of this note may have been dictated using voice recognition software. There may be variances in spelling and vocabulary which are unintentional. Not all errors are proofread. Please notify the Thereasa Parkin if any discrepancies are noted or if the meaning of any statement is not clear.

## 2022-08-24 ENCOUNTER — Other Ambulatory Visit: Payer: Self-pay | Admitting: Family Medicine

## 2022-08-24 DIAGNOSIS — I159 Secondary hypertension, unspecified: Secondary | ICD-10-CM

## 2022-08-25 ENCOUNTER — Encounter (HOSPITAL_BASED_OUTPATIENT_CLINIC_OR_DEPARTMENT_OTHER): Payer: Self-pay | Admitting: Family

## 2022-08-25 ENCOUNTER — Ambulatory Visit (HOSPITAL_BASED_OUTPATIENT_CLINIC_OR_DEPARTMENT_OTHER): Payer: PPO | Admitting: Family

## 2022-08-25 VITALS — BP 172/66 | HR 63 | Ht 72.0 in | Wt 230.0 lb

## 2022-08-25 DIAGNOSIS — I480 Paroxysmal atrial fibrillation: Secondary | ICD-10-CM

## 2022-08-25 DIAGNOSIS — E785 Hyperlipidemia, unspecified: Secondary | ICD-10-CM

## 2022-08-25 DIAGNOSIS — Z79899 Other long term (current) drug therapy: Secondary | ICD-10-CM | POA: Diagnosis not present

## 2022-08-25 DIAGNOSIS — E875 Hyperkalemia: Secondary | ICD-10-CM

## 2022-08-25 DIAGNOSIS — N289 Disorder of kidney and ureter, unspecified: Secondary | ICD-10-CM

## 2022-08-25 DIAGNOSIS — I10 Essential (primary) hypertension: Secondary | ICD-10-CM | POA: Diagnosis not present

## 2022-08-25 DIAGNOSIS — R7989 Other specified abnormal findings of blood chemistry: Secondary | ICD-10-CM

## 2022-08-25 DIAGNOSIS — I25118 Atherosclerotic heart disease of native coronary artery with other forms of angina pectoris: Secondary | ICD-10-CM

## 2022-08-25 MED ORDER — HYDRALAZINE HCL 25 MG PO TABS: 75.0000 mg | ORAL_TABLET | Freq: Three times a day (TID) | ORAL | 3 refills | Status: DC

## 2022-08-25 NOTE — Progress Notes (Signed)
Advanced Hypertension Clinic Initial Assessment:    Date:  08/25/2022   ID:  [name], DOB 1946/03/24, MRN [id number]  PCP:  Everrett Coombe, DO  Cardiologist:  None  Nephrologist:  Referring MD: Everrett Coombe, DO   CC: Hypertension  History of Present Illness:    [name] is a 77 y.o. male with a hx of hypertension, chronic infection of right hip (managed by ID), DM2, CAD, osteoarthritis, prostate cancer here to follow up in the Advanced [facility]pertension Clinic. He was referred by his primary care provider, Dr. Ashley Royalty, due to elevated blood pressure despite HCTZ, Spironolactone, Alodipine, Hydralazine. Last seen 06/30/22.  Initial ADV HTN clinic visit 05/19/22. Follows with [name] for general cardiology. [name] was diagnosed with hypertension in the 1990s at the time of his MI. Blood pressure checked with arm cuff at home mostly 140s/60s. he reports tobacco use remotely having quit 1986. Alcohol use rarely. For exercise he goes to [facility] and does predominantly upper body exercises. he eats at home and outside of the home and does not follow low sodium diet. Drinks water and Spite during the day.   Notes his atrial fibrillation when he strains but overall not bothersome. Unclear why he is not on [facility], notes from primary cardiologist received but atrial fibrillation not clearly addressed.   At initial visit Hydralazine increased to 25mg  TID. Amlodipine, Spironolactone, Toprol continued. Renal duplex 06/10/22 with no stenosis. Metanephrines, catecholamines, cortisol were normal. Mildly elevated TSH but T3/T4 normal with plans to repeat in 3 months. Potassium mildly elevated and spironolactone reduced to 12.5mg  QD.   Last seen 06/30/22 at which time hydralazine was increased to 50 mg 3 times daily.  He did have a cyst to his left elbow and was recommended to follow-up with primary care provider regarding this. He was referred to nephrology due to persistent  hyperkalemia requiring Lokelma. Appointment not yet scheduled.   Presents today for follow up with family member. Feeling overall well. Elbow cyst spontaneously resolved without intervention. His blood pressure has been persistently difficult to control.  BP log reviewed with range 129/49-155/60. Average SBP 143. Notes for some reason the pharmacy provided him and 25 mg tablets of hydralazine last fill and has been taking 2 of them twice per day to make 50 mg twice daily dosing.   Reports no shortness of breath nor dyspnea on exertion. Reports no chest pain, pressure, or tightness. No edema, orthopnea, PND. Reports no palpitations.    Previous antihypertensives: Losartan - Potassium elevated  Past Medical History:  Diagnosis Date   Arthritis    Asthma    no inhaler   Atrial fibrillation (HCC) 10/16/2020   CKD (chronic kidney disease) 04/13/2020   Coronary artery disease    cardiologist-  dr spruill; last visit 3 mos ago per pt   Enterobacter sepsis (HCC) 04/13/2020   GERD (gastroesophageal reflux disease)    History of MI (myocardial infarction)    1985   Hydronephrosis, left    Hypertension    Myocardial infarction [facility])    Nephrolithiasis    left   Neuromuscular disorder (HCC)    TINGLING IN BOTH HANDS   Presence of tooth-root and mandibular implants    lower dental implants   Prostate cancer (HCC)    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   QT prolongation 10/16/2020   Shortness of breath    WITH EXERTION   Thigh abscess 04/13/2020   Type 2 diabetes mellitus (HCC)  Vaccine counseling 04/28/2022   Zoster 09/13/2021    Past Surgical History:  Procedure Laterality Date   BUNIONECTOMY     CYSTOSCOPY W/ RETROGRADES Left 03/14/2014   Procedure: CYSTOSCOPY WITH LEFT RETROGRADE PYELOGRAM, Left Ureteroscopy, Lweft ureteral Stent No string;  Surgeon: Danae Chen, MD;  Location: [facility];  Service: Urology;  Laterality: Left;   CYSTOSCOPY W/ URETERAL STENT  PLACEMENT Bilateral 01/15/2020   Procedure: CYSTOSCOPY WITH RETROGRADE PYELOGRAM/URETERAL STENT PLACEMENT;  Surgeon: Sebastian Ache, MD;  Location: [facility];  Service: Urology;  Laterality: Bilateral;   CYSTOSCOPY/URETEROSCOPY/HOLMIUM LASER/STENT PLACEMENT Bilateral 03/17/2020   Procedure: CYSTOSCOPY/URETEROSCOPY/HOLMIUM LASER/STENT LEFT PLACEMENT;  Surgeon: Jerilee Field, MD;  Location: [facility];  Service: Urology;  Laterality: Bilateral;   HERNIA REPAIR     INCISION AND DRAINAGE Left 03/09/2020   Procedure: INCISION AND DRAINAGE LEFT HIP WITH LINER EXCHANGE;  Surgeon: Ollen Gross, MD;  Location: [facility];  Service: Orthopedics;  Laterality: Left;   IR FLUORO GUIDE CV LINE RIGHT  03/10/2020   IR RADIOLOGIST EVAL & MGMT  04/01/2020   IR RADIOLOGIST EVAL & MGMT  04/15/2020   IR RADIOLOGIST EVAL & MGMT  04/30/2020   IR REMOVAL TUN CV CATH W/O FL  04/22/2020   IR US GUIDE BX ASP/DRAIN  03/17/2020   IR US GUIDE VASC ACCESS RIGHT  03/10/2020   PROSTATE BIOPSY     TOTAL HIP ARTHROPLASTY Left 03-20-2009   TOTAL HIP ARTHROPLASTY  04/09/2012   Procedure: TOTAL HIP ARTHROPLASTY;  Surgeon: Loanne Drilling, MD;  Location: [facility];  Service: Orthopedics;  Laterality: Right;   TOTAL KNEE ARTHROPLASTY Left 05-16-2008    Current Medications: No outpatient medications have been marked as taking for the 08/25/22 encounter (Appointment) with Alver Sorrow, NP.     Allergies:   Shellfish allergy   Social History   Socioeconomic History   Marital status: Widowed    Spouse name: Not on file   Number of children: 4   Years of education: 9   Highest education level: 9th grade  Occupational History   Occupation: Retired  Tobacco Use   Smoking status: Former    Packs/day: 1.00    Years: 25.00    Additional pack years: 0.00    Total pack years: 25.00    Types: Cigarettes    Quit date: 04/11/1984    Years since quitting: 38.3   Smokeless tobacco: Former    Quit date: 04/02/1985  Vaping Use   Vaping  Use: Never used  Substance and Sexual Activity   Alcohol use: Not Currently    Comment: RARE   Drug use: No   Sexual activity: Not Currently  Other Topics Concern   Not on file  Social History Narrative   Lives alone. He had four children and one has deceased. He enjoys watching television.   Social Determinants of Health   Financial Resource Strain: Low Risk  (09/21/2021)   Overall Financial Resource Strain (CARDIA)    Difficulty of Paying Living Expenses: Not hard at all  Food Insecurity: No Food Insecurity (09/21/2021)   Hunger Vital Sign    Worried About Running Out of Food in the Last Year: Never true    Ran Out of Food in the Last Year: Never true  Transportation Needs: No Transportation Needs (09/21/2021)   PRAPARE - Administrator, Civil Service (Medical): No    Lack of Transportation (Non-Medical): No  Physical Activity: Sufficiently Active (09/21/2021)   Exercise Vital Sign  Days of Exercise per Week: 5 days    Minutes of Exercise per Session: 40 min  Stress: No Stress Concern Present (09/21/2021)   [employer] of Occupational Health - Occupational Stress Questionnaire    Feeling of Stress : Not at all  Social Connections: Moderately Isolated (09/21/2021)   Social Connection and Isolation Panel [NHANES]    Frequency of Communication with Friends and Family: More than three times a week    Frequency of Social Gatherings with Friends and Family: Twice a week    Attends Religious Services: More than 4 times per year    Active Member of [employer] or Organizations: No    Attends Banker Meetings: Never    Marital Status: Widowed     Family History: The patient's family history includes Cancer in his father, maternal uncle, and mother; Diabetes in his father; Multiple sclerosis in his daughter.  ROS:   Please see the history of present illness.    All other systems reviewed and are negative.  EKGs/Labs/Other Studies Reviewed:    EKG:  EKG is  not ordered today.    Recent Labs: 04/28/2022: Hemoglobin 9.9; Platelets 272 05/24/2022: ALT 20 07/26/2022: TSH 9.420 08/17/2022: BUN 39; Creatinine, Ser 1.94; Potassium 4.2; Sodium 140   Recent Lipid Panel    Component Value Date/Time   CHOL 121 12/10/2021 0821   TRIG 146.0 12/10/2021 0821   HDL 37.40 (L) 12/10/2021 0821   CHOLHDL 3 12/10/2021 0821   VLDL 29.2 12/10/2021 0821   LDLCALC 54 12/10/2021 0821   LDLCALC 146 (H) 05/18/2020 0000   LDLDIRECT 132 (H) 10/24/2019 1610    Physical Exam:   VS:  There were no vitals taken for this visit. , BMI There is no height or weight on file to calculate BMI. GENERAL:  Well appearing, overweight HEENT: Pupils equal round and reactive, fundi not visualized, oral mucosa unremarkable NECK:  No jugular venous distention, waveform within normal limits, carotid upstroke brisk and symmetric, no bruits, no thyromegaly LYMPHATICS:  No cervical adenopathy LUNGS:  Clear to auscultation bilaterally HEART:  RRR.  PMI not displaced or sustained,S1 and S2 within normal limits, no S3, no S4, no clicks, no rubs, no murmurs ABD:  Flat, positive bowel sounds normal in frequency in pitch, no bruits, no rebound, no guarding, no midline pulsatile mass, no hepatomegaly, no splenomegaly EXT:  2 plus pulses throughout, no edema, no cyanosis no clubbing SKIN:  No rashes no nodules. NEURO:  Cranial nerves II through XII grossly intact, motor grossly intact throughout PSYCH:  Cognitively intact, oriented to person place and time   ASSESSMENT/PLAN:    HTN - BP not at goal <130/80. Increase Hydralazine to 75mg  TID.  Reiterated need to check blood pressure after medications.  Continue Amlodipine 10mg  QD, Spironolactone 25mg  QD, Toprol 50mg  QD.  Losartan, spironolactone previously discontinued due to hyperemia. Secondary workup thus far unremarkable. Heart healthy diet and regular cardiovascular exercise encouraged.   Call to check in in 2 weeks.  If BP not at goal plan  to increase hydralazine to 100 mg TID at that time.  Hyperkalemia - 05/24/22 K 5.4 ? 06/29/21 K 5.7 ? 07/04/22 K 5.4 ? 08/08/22 K 5.3 ? 08/17/22 K 4.2. Required multiple packets of Lokelma to correct which unfortunately his insurance does not cover.  Hyperkalemia presently resolved.  Spironolactone has been discontinued.  Has been referred to nephrology.  CKD - Careful titration of diuretic and antihypertensive. Has been referred to nephrology and awaiting appt.  Abnormal TSH / Subclinical hypothyroidism - 08/19/2022 Endocrinology started low-dose levothyroxine.  Continued monitoring by their office.  CAD - Prior MI 33. Myoview 05/2021 low risk study. Stable with no anginal symptoms. No indication for ischemic evaluation.  Follows with [name].  Continue aspirin, rosuvastatin, Jardiance, metoprolol.  PAF / On Amiodarone therapy - RRR by auscultation. Reports rare palpitations. Follows with [name]. Not on OAC, unclear why and records reviewed from [name] do not clearly state why. Will defer to [name] as he is primary general cardiologist.  HLD, LDL goal < 70 - 12/2021 LDL 54. Continue Rosuvastatin.   DM2 - Follows with endocrinology.  Appreciate inclusion of Jardiance for cardioprotective benefit.  Obesity - Weight loss via diet and exercise encouraged. Discussed the impact being overweight would have on cardiovascular risk.Congratulated on 4 lb weight loss.  Screening for Secondary Hypertension:  Click here to document screening for secondary causes of HTN  :[id number]}     05/19/2022    9:12 AM 06/30/2022    1:21 PM  Causes  Renovascular HTN Screened Screened     - Comments  Renal duplex no stenosis  Thyroid Disease Screened Screened     - Comments  abnormal TSH but T3/T4 normal  Hyperaldosteronism Not Screened      - Comments already on Cleda Daub - could stop in future to assess if indicated   Pheochromocytoma Screened Screened     - Comments  normal catecholamines,  metanephrines  Cushing's Syndrome Screened Screened     - Comments  normal cortisol  Coarctation of the Aorta N/A      - Comments symmetrical BP   Compliance Screened     Relevant Labs/Studies:    Latest Ref Rng & Units 08/17/2022    8:47 AM 08/08/2022    8:35 AM 07/04/2022    8:26 AM  Basic Labs  Sodium 134 - 144 mmol/L 140  140  138   Potassium 3.5 - 5.2 mmol/L 4.2  5.3  5.4   Creatinine 0.76 - 1.27 mg/dL 4.09  8.11  9.14        Latest Ref Rng & Units 07/26/2022    8:28 AM 06/30/2022    9:05 AM  Thyroid   TSH 0.450 - 4.500 uIU/mL 9.420  12.200           Latest Ref Rng & Units 05/24/2022    7:08 AM  Metanephrines/Catecholamines   Epinephrine 0 - 62 pg/mL 46   Norepinephrine 0 - 874 pg/mL 1,042   Dopamine 0 - 48 pg/mL <30   Metanephrines 0.0 - 88.0 pg/mL 38.8   Normetanephrines  0.0 - 285.2 pg/mL 164.5           06/10/2022    8:45 AM  Renovascular   Renal Artery [place] Completed Yes     Disposition:    FU with MD/PharmD/APP in 4 months   Medication Adjustments/Labs and Tests Ordered: Current medicines are reviewed at length with the patient today.  Concerns regarding medicines are outlined above.  No orders of the defined types were placed in this encounter.  No orders of the defined types were placed in this encounter.    Signed, Alver Sorrow, NP  08/25/2022 8:02 AM    [facility]

## 2022-08-25 NOTE — Patient Instructions (Signed)
Medication Instructions:  Your physician has recommended you make the following change in your medication:   Increase: Hydralazine 75mg  three times daily  You may take a 50mg  and 25mg  tablet together or Three 25mg  tablets together  *If you need a refill on your cardiac medications before your next appointment, please call your pharmacy*  Follow-Up: At [facility], you and your health needs are our priority.  As part of our continuing mission to provide you with exceptional heart care, we have created designated Provider Care Teams.  These Care Teams include your primary Cardiologist (physician) and Advanced Practice Providers (APPs -  Physician Assistants and Nurse Practitioners) who all work together to provide you with the care you need, when you need it.  We recommend signing up for the patient portal called "MyChart".  Sign up information is provided on this After Visit Summary.  MyChart is used to connect with patients for Virtual Visits (Telemedicine).  Patients are able to view lab/test results, encounter notes, upcoming appointments, etc.  Non-urgent messages can be sent to your provider as well.   To learn more about what you can do with MyChart, go to [email or web address].    Your next appointment:   4 month(s)  Provider:   Gillian Shields, NP    Other Instructions We will call you in two weeks to check in one medication change.

## 2022-08-31 ENCOUNTER — Other Ambulatory Visit: Payer: Self-pay | Admitting: Endocrinology

## 2022-09-02 ENCOUNTER — Encounter (HOSPITAL_BASED_OUTPATIENT_CLINIC_OR_DEPARTMENT_OTHER): Payer: Self-pay | Admitting: Emergency Medicine

## 2022-09-02 ENCOUNTER — Emergency Department (HOSPITAL_BASED_OUTPATIENT_CLINIC_OR_DEPARTMENT_OTHER)
Admission: EM | Admit: 2022-09-02 | Discharge: 2022-09-02 | Disposition: A | Discharge status: Home / Self Care | Payer: PPO | Attending: Emergency Medicine | Admitting: Emergency Medicine

## 2022-09-02 ENCOUNTER — Emergency Department (HOSPITAL_BASED_OUTPATIENT_CLINIC_OR_DEPARTMENT_OTHER): Payer: PPO

## 2022-09-02 ENCOUNTER — Other Ambulatory Visit: Payer: Self-pay

## 2022-09-02 ENCOUNTER — Other Ambulatory Visit (HOSPITAL_BASED_OUTPATIENT_CLINIC_OR_DEPARTMENT_OTHER): Payer: Self-pay

## 2022-09-02 DIAGNOSIS — Z79899 Other long term (current) drug therapy: Secondary | ICD-10-CM | POA: Diagnosis not present

## 2022-09-02 DIAGNOSIS — I2510 Atherosclerotic heart disease of native coronary artery without angina pectoris: Secondary | ICD-10-CM | POA: Diagnosis not present

## 2022-09-02 DIAGNOSIS — M12561 Traumatic arthropathy, right knee: Secondary | ICD-10-CM | POA: Diagnosis not present

## 2022-09-02 DIAGNOSIS — E1122 Type 2 diabetes mellitus with diabetic chronic kidney disease: Secondary | ICD-10-CM | POA: Diagnosis not present

## 2022-09-02 DIAGNOSIS — Z7982 Long term (current) use of aspirin: Secondary | ICD-10-CM | POA: Insufficient documentation

## 2022-09-02 DIAGNOSIS — M25561 Pain in right knee: Secondary | ICD-10-CM | POA: Diagnosis not present

## 2022-09-02 DIAGNOSIS — I4891 Unspecified atrial fibrillation: Secondary | ICD-10-CM | POA: Insufficient documentation

## 2022-09-02 DIAGNOSIS — M1711 Unilateral primary osteoarthritis, right knee: Secondary | ICD-10-CM | POA: Diagnosis not present

## 2022-09-02 DIAGNOSIS — N1832 Chronic kidney disease, stage 3b: Secondary | ICD-10-CM | POA: Insufficient documentation

## 2022-09-02 DIAGNOSIS — I129 Hypertensive chronic kidney disease with stage 1 through stage 4 chronic kidney disease, or unspecified chronic kidney disease: Secondary | ICD-10-CM | POA: Insufficient documentation

## 2022-09-02 MED ORDER — OXYCODONE HCL 5 MG PO TABS
5.0000 mg | ORAL_TABLET | Freq: Once | ORAL | Status: AC
  Administered 2022-09-02: 5 mg via ORAL
  Filled 2022-09-02: qty 1

## 2022-09-02 MED ORDER — OXYCODONE HCL 5 MG PO TABS: 5.0000 mg | ORAL_TABLET | Freq: Three times a day (TID) | ORAL | 0 refills | Status: AC | PRN

## 2022-09-02 NOTE — ED Provider Notes (Addendum)
[place] EMERGENCY DEPARTMENT AT [facility] Provider Note   CSN: [id number] Arrival date & time: 09/02/22  8295     History  Chief Complaint  Patient presents with   Knee Pain    [name] is a 77 y.o. male with pmh CKD3b,HTN,T2DM,Afib,CAD, Bilateral hip and L knee OA s/p arthroplasty.  Patient reports falling a few days ago on Monday. He says he stumbled while using his walker and had to go down. He fell on concrete, did not hit his head, endorses that his L shoulder took much of the fall but he did hit his R knee. No punctures or wounds. Says no pops or cracks heard but may have twisted it.The knee was swollen with fluid prior to this, no real change. Says he has been getting it drained by ortho and the effusion has been from OA. Has had persistent pain since the fall and reduced range of motion and trouble walking. Has tried tylenol, topical pain reliever and ice with no real improvement. Says that he does have some pain in his shoulder but no weakness, loss of sensation or reduced mobility. This has gotten better since the fall. Denies taking blood thinners.  [id number]   Knee Pain      Home Medications Prior to Admission medications   Medication Sig Start Date End Date Taking? Authorizing Provider  oxyCODONE (ROXICODONE) 5 MG immediate release tablet Take 1 tablet (5 mg total) by mouth every 8 (eight) hours as needed for up to 5 days for severe pain. 09/02/22 09/07/22 Yes Willette Cluster, MD  albuterol (VENTOLIN HFA) 108 (90 Base) MCG/ACT inhaler Inhale 2 puffs into the lungs every 6 (six) hours as needed for wheezing or shortness of breath. 06/09/21   Everrett Coombe, DO  allopurinol (ZYLOPRIM) 100 MG tablet TAKE 1/2 TABLET BY MOUTH EVERY DAY 04/01/22   Everrett Coombe, DO  amiodarone (PACERONE) 200 MG tablet Take 200 mg by mouth daily. [name]    [provider]  amLODipine (NORVASC) 10 MG tablet TAKE 1 TABLET BY MOUTH EVERY DAY 08/24/22   Everrett Coombe,  DO  aspirin EC 81 MG tablet Take 81 mg by mouth daily. Swallow whole.    [provider]  colchicine 0.6 MG tablet TAKE 1 TABLET BY MOUTH EVERY DAY 05/20/22   Everrett Coombe, DO  Continuous Glucose Sensor (FREESTYLE LIBRE 3 SENSOR) MISC USE 1 AS DIRECTED EVERY 14 DAYS TO CHECK GLUCOSE CONTINUOUSLY 08/02/22   Everrett Coombe, DO  docusate sodium (COLACE) 100 MG capsule Take 100 mg by mouth daily as needed for mild constipation.    [provider]  empagliflozin (JARDIANCE) 10 MG TABS tablet Take 1 tablet (10 mg total) by mouth daily before breakfast. 08/19/22   Motwani, Carin Hock, MD  ferrous sulfate 325 (65 FE) MG tablet Take 325 mg by mouth daily with breakfast.    [provider]  furosemide (LASIX) 20 MG tablet Take 1 tablet (20 mg total) by mouth daily. 07/28/22 07/23/23  Alver Sorrow, NP  hydrALAZINE (APRESOLINE) 25 MG tablet Take 3 tablets (75 mg total) by mouth 3 (three) times daily. 08/25/22   Alver Sorrow, NP  levofloxacin (LEVAQUIN) 500 MG tablet Take 1 tablet by mouth daily. 04/28/22   Randall Hiss, MD  levothyroxine (SYNTHROID) 75 MCG tablet Take 1 tablet (75 mcg total) by mouth daily. 08/19/22   Altamese Ledbetter, MD  metoprolol succinate (TOPROL-XL) 50 MG 24 hr tablet Take 50 mg by mouth daily. 08/09/20  [provider]  omeprazole (PRILOSEC) 40 MG capsule Take 40 mg by mouth daily.    [provider]  repaglinide (PRANDIN) 0.5 MG tablet TAKE 1 TABLET (0.5 MG TOTAL) BY MOUTH 3 (THREE) TIMES DAILY BEFORE MEALS. 03/08/22   Reather Littler, MD  rosuvastatin (CRESTOR) 40 MG tablet Take 40 mg by mouth daily.    [provider]  sitaGLIPtin (JANUVIA) 50 MG tablet Take 1 tablet (50 mg total) by mouth daily. 06/26/16   Catarina Hartshorn, MD      Allergies    Shellfish allergy    Review of Systems   Review of Systems  All other systems reviewed and are negative.   Physical Exam Updated Vital Signs BP (!) 163/73   Pulse (!) 58   Temp (P)  98.2 F (36.8 C)   Resp (P) 18   Ht 6' (1.829 m)   Wt 104 kg   SpO2 97%   BMI 31.10 kg/m  Physical Exam Constitutional:      General: He is in acute distress.     Appearance: Normal appearance. He is obese. He is not ill-appearing.  Musculoskeletal:     Comments: R knee: diffuse swelling most prominent at the superior lateral>medial aspect of the patella. Limited active and passive extension and flexion. 5/5 strength of the knee to flexion and extension.Medial joint line tenderness. No step offs, bony deformity or crepitus. No pain or joint laxity with drawer signs and varus/valgus maneuvers. Mcmurrays limited by rom.  L shoulder: No step offs, bony deformity or crepitus.Full active and passive rom. 5/5 strength in all directions of the shoulder joint.  Neurological:     Mental Status: He is alert.     ED Results / Procedures / Treatments   Labs (all labs ordered are listed, but only abnormal results are displayed) Labs Reviewed - No data to display  EKG None  Radiology DG Knee Complete 4 Views Right  Result Date: 09/02/2022 CLINICAL DATA:  Right knee pain after fall several days ago. EXAM: RIGHT KNEE - COMPLETE 4+ VIEW COMPARISON:  None Available. FINDINGS: No definite fracture or dislocation is noted. Small suprapatellar joint effusion is noted. Moderate narrowing of medial joint space is noted. Mild osteophyte formation is noted laterally. IMPRESSION: Moderate degenerative joint disease is noted medially. Small suprapatellar joint effusion. No fracture or dislocation. Electronically Signed   By: Lupita Raider M.D.   On: 09/02/2022 08:18    Procedures Procedures    Medications Ordered in ED Medications  oxyCODONE (Oxy IR/ROXICODONE) immediate release tablet 5 mg (5 mg Oral Given 09/02/22 0849)    ED Course/ Medical Decision Making/ A&P                             Medical Decision Making Amount and/or Complexity of Data Reviewed Radiology:  ordered.  Risk Prescription drug management.  Patient presents a few days after fall on R knee. Has baseline effusion from OA, subjectively no worse. No warmth or erythema.Does have limited active and passive ROM as well as midline joint pain. No joint laxity. No step offs, bony deformity or crepitus at the patella. Will get xray to rule out fracture. Low suspicion for ligamentous injury given no worsening effusion or laxity. Patient has CKD3b, pain meds would be limited to tylenol or lower dose opioids such as oxycodone. Would also benefit from PT.  Update: Xray R knee without any fracture or dislocation. Bedside ultrasound with  effusion but no evidence of meniscal tear.Will discharge with a few days of oxycodone. Has appointment with orthopedist next month. Should follow up with his PCP to get PT scheduled. Final Clinical Impression(s) / ED Diagnoses Final diagnoses:  Arthropathy, traumatic, knee, right    Rx / DC Orders ED Discharge Orders          Ordered    oxyCODONE (ROXICODONE) 5 MG immediate release tablet  Every 8 hours PRN        09/02/22 0856              Willette Cluster, MD 09/02/22 1610    Willette Cluster, MD 09/02/22 9604    Blane Ohara, MD 09/02/22 1538

## 2022-09-02 NOTE — ED Notes (Signed)
Ice pack applied to R knee

## 2022-09-02 NOTE — ED Triage Notes (Signed)
Pt c/o right knee pain since falling Monday.

## 2022-09-02 NOTE — ED Notes (Signed)
Discharge instructions, pain management, follow up care, and prescriptions reviewed and explained, pt verbalized understanding. Ice pack provided. Pt caox4 and ambulatory with his walker as normal on d/c.

## 2022-09-02 NOTE — Discharge Instructions (Signed)
You were evaluated after a fall on your right knee. There was no evidence of fracture on your xray. The exam was not consistent with ligament or cartilage tear. There is no further imaging that needs to be done and no further emergency room work up. You should follow up with your orthopedist as scheduled next month. We will discharge you with a few days of oxycodone. These can be sedating so be careful with driving or with working, also they can increase risk of falls so be cautious and do not continue using if you get really sleepy and are having worsened balance. You would benefit from outpatient physical therapy, you should follow up with your primary care doctor within 1-2 weeks to get this arranged. Keep moving your knee and walking as much as possible to retain strength and your range of motion.

## 2022-09-08 ENCOUNTER — Telehealth (HOSPITAL_BASED_OUTPATIENT_CLINIC_OR_DEPARTMENT_OTHER): Payer: Self-pay

## 2022-09-08 NOTE — Telephone Encounter (Signed)
BP not at goal. Increase Hydralazine to 100mg  TID.   Alver Sorrow, NP

## 2022-09-08 NOTE — Telephone Encounter (Signed)
  Pt c/o BP issue: STAT if pt c/o blurred vision, one-sided weakness or slurred speech  1. What are your last 5 BP readings?   BP  05/17 AM 140/63 61           PM 146/50 61 05/18 - 148/54 55 05/20 - 138/54 64 05/21 - 146/54 64 05/22 - 141/53 64 05/24 - 151/58 62 05/25 - 143/55 58 05/26 - 131/50 60 05/27 - 150/66 61 05/28 - 150/59 58 05/30 - 149/68 69  2. Are you having any other symptoms (ex. Dizziness, headache, blurred vision, passed out)? No symptoms   3. What is your BP issue?  Pt called to provide BP readings

## 2022-09-08 NOTE — Telephone Encounter (Addendum)
Left message for patient to call back   ----- Message from Marlene Lard, RN sent at 08/25/2022  8:36 AM EDT ----- Call for BP

## 2022-09-08 NOTE — Telephone Encounter (Signed)
Patient is returning call. States he will call back when home to give BP readings.

## 2022-09-08 NOTE — Telephone Encounter (Signed)
Will forward to Caitlin W NP for review  

## 2022-09-08 NOTE — Telephone Encounter (Signed)
Left message to call back  

## 2022-09-09 MED ORDER — HYDRALAZINE HCL 100 MG PO TABS: 100.0000 mg | ORAL_TABLET | Freq: Three times a day (TID) | ORAL | 3 refills | Status: DC

## 2022-09-09 NOTE — Telephone Encounter (Signed)
Per Gillian Shields, NP   "BP not at goal. Increase Hydralazine to 100mg  TID.    Alver Sorrow, NP"   Returned call to patient, reviewed the above recommendations with the patient who verbalizes understanding. He will take four of his 25mg  tablets 3 times per day to use them up and then pick up the new prescription for 100mg  tablets.

## 2022-09-09 NOTE — Telephone Encounter (Signed)
Patient returned RN's call. 

## 2022-09-09 NOTE — Addendum Note (Signed)
Addended by: Marlene Lard on: 09/09/2022 08:40 AM   Modules accepted: Orders

## 2022-09-20 ENCOUNTER — Other Ambulatory Visit: Payer: Self-pay | Admitting: Family Medicine

## 2022-09-21 ENCOUNTER — Telehealth (HOSPITAL_BASED_OUTPATIENT_CLINIC_OR_DEPARTMENT_OTHER): Payer: Self-pay | Admitting: *Deleted

## 2022-09-21 NOTE — Telephone Encounter (Signed)
Called [facility] to follow up on referral placed 08/10/2022 MD reviewed records and said patient needed appointment 1 to 2 months, they will reach out to patient to get scheduled

## 2022-09-27 ENCOUNTER — Ambulatory Visit (INDEPENDENT_AMBULATORY_CARE_PROVIDER_SITE_OTHER): Payer: PPO | Admitting: Family Medicine

## 2022-09-27 DIAGNOSIS — Z0000 Encounter for general adult medical examination without abnormal findings: Secondary | ICD-10-CM

## 2022-09-27 NOTE — Patient Instructions (Addendum)
MEDICARE ANNUAL WELLNESS VISIT Health Maintenance Summary and Written Plan of Care  [name] ,  Thank you for allowing me to perform your Medicare Annual Wellness Visit and for your ongoing commitment to your health.   Health Maintenance & Immunization History Health Maintenance  Topic Date Due   COVID-19 Vaccine (3 - Mixed Product risk series) 05/20/2023 (Originally 04/07/2021)   Zoster Vaccines- Shingrix (1 of 2) 08/02/2023 (Originally 08/05/1964)   OPHTHALMOLOGY EXAM  10/06/2022   INFLUENZA VACCINE  11/10/2022   Diabetic kidney evaluation - Urine ACR  12/11/2022   HEMOGLOBIN A1C  02/19/2023   FOOT EXAM  08/02/2023   Diabetic kidney evaluation - eGFR measurement  08/17/2023   Medicare Annual Wellness (AWV)  09/27/2023   DTaP/Tdap/Td (2 - Td or Tdap) 04/10/2025   Pneumonia Vaccine 69+ Years old  Completed   Hepatitis C Screening  Completed   HPV VACCINES  Aged Out   Colonoscopy  Discontinued   Immunization History  Administered Date(s) Administered   Fluad Quad(high Dose 65+) 01/22/2019, 01/21/2020, 03/10/2021   Influenza, High Dose Seasonal PF 02/03/2017, 04/02/2018   Moderna SARS-COV2 Booster Vaccination 03/18/2020   Moderna Sars-Covid-2 Vaccination 06/22/2019, 03/18/2020   Pfizer Covid-19 Vaccine Bivalent Booster 5y-11y 03/10/2021   Pneumococcal Conjugate-13 04/24/2019   Pneumococcal Polysaccharide-23 10/23/2018   Tdap 04/11/2015    These are the patient goals that we discussed:  Goals Addressed               This Visit's Progress     Patient Stated (pt-stated)        Patient stated that he would like to be more active and build strength in his legs.         This is a list of Health Maintenance Items that are overdue or due now: Shingles vaccine  Orders/Referrals Placed Today: Orders Placed This Encounter  Procedures   AMB Referral to Community Care Coordinaton ([facility] Patients)    Referral Priority:   Routine    Referral Type:   Consultation     Referral Reason:   Care Coordination    Number of Visits Requested:   1   (Contact our referral department at [phone] if you have not spoken with someone about your referral appointment within the next 5 days)    Follow-up Plan Follow-up with Everrett Coombe, DO as planned Schedule shingles vaccine at the pharmacy. Medicare wellness visit in one year.  AVS printed and mailed to the patient.       Health Maintenance, Male Adopting a healthy lifestyle and getting preventive care are important in promoting health and wellness. Ask your health care provider about: The right schedule for you to have regular tests and exams. Things you can do on your own to prevent diseases and keep yourself healthy. What should I know about diet, weight, and exercise? Eat a healthy diet  Eat a diet that includes plenty of vegetables, fruits, low-fat dairy products, and lean protein. Do not eat a lot of foods that are high in solid fats, added sugars, or sodium. Maintain a healthy weight Body mass index (BMI) is a measurement that can be used to identify possible weight problems. It estimates body fat based on height and weight. Your health care provider can help determine your BMI and help you achieve or maintain a healthy weight. Get regular exercise Get regular exercise. This is one of the most important things you can do for your health. Most adults should: Exercise for at least 150 minutes  each week. The exercise should increase your heart rate and make you sweat (moderate-intensity exercise). Do strengthening exercises at least twice a week. This is in addition to the moderate-intensity exercise. Spend less time sitting. Even light physical activity can be beneficial. Watch cholesterol and blood lipids Have your blood tested for lipids and cholesterol at 77 years of age, then have this test every 5 years. You may need to have your cholesterol levels checked more often if: Your lipid or  cholesterol levels are high. You are older than 77 years of age. You are at high risk for heart disease. What should I know about cancer screening? Many types of cancers can be detected early and may often be prevented. Depending on your health history and family history, you may need to have cancer screening at various ages. This may include screening for: Colorectal cancer. Prostate cancer. Skin cancer. Lung cancer. What should I know about heart disease, diabetes, and high blood pressure? Blood pressure and heart disease High blood pressure causes heart disease and increases the risk of stroke. This is more likely to develop in people who have high blood pressure readings or are overweight. Talk with your health care provider about your target blood pressure readings. Have your blood pressure checked: Every 3-5 years if you are 55-12 years of age. Every year if you are 41 years old or older. If you are between the ages of 35 and 52 and are a current or former smoker, ask your health care provider if you should have a one-time screening for abdominal aortic aneurysm (AAA). Diabetes Have regular diabetes screenings. This checks your fasting blood sugar level. Have the screening done: Once every three years after age 73 if you are at a normal weight and have a low risk for diabetes. More often and at a younger age if you are overweight or have a high risk for diabetes. What should I know about preventing infection? Hepatitis B If you have a higher risk for hepatitis B, you should be screened for this virus. Talk with your health care provider to find out if you are at risk for hepatitis B infection. Hepatitis C Blood testing is recommended for: Everyone born from [birth year] through 1965. Anyone with known risk factors for hepatitis C. Sexually transmitted infections (STIs) You should be screened each year for STIs, including gonorrhea and chlamydia, if: You are sexually active and are younger  than 77 years of age. You are older than 77 years of age and your health care provider tells you that you are at risk for this type of infection. Your sexual activity has changed since you were last screened, and you are at increased risk for chlamydia or gonorrhea. Ask your health care provider if you are at risk. Ask your health care provider about whether you are at high risk for HIV. Your health care provider may recommend a prescription medicine to help prevent HIV infection. If you choose to take medicine to prevent HIV, you should first get tested for HIV. You should then be tested every 3 months for as long as you are taking the medicine. Follow these instructions at home: Alcohol use Do not drink alcohol if your health care provider tells you not to drink. If you drink alcohol: Limit how much you have to 0-2 drinks a day. Know how much alcohol is in your drink. In the [place] one drink equals one 12 oz bottle of beer (355 mL), one 5 oz glass of wine (148  mL), or one 1 oz glass of hard liquor (44 mL). Lifestyle Do not use any products that contain nicotine or tobacco. These products include cigarettes, chewing tobacco, and vaping devices, such as e-cigarettes. If you need help quitting, ask your health care provider. Do not use street drugs. Do not share needles. Ask your health care provider for help if you need support or information about quitting drugs. General instructions Schedule regular health, dental, and eye exams. Stay current with your vaccines. Tell your health care provider if: You often feel depressed. You have ever been abused or do not feel safe at home. Summary Adopting a healthy lifestyle and getting preventive care are important in promoting health and wellness. Follow your health care provider's instructions about healthy diet, exercising, and getting tested or screened for diseases. Follow your health care provider's instructions on monitoring your cholesterol and  blood pressure. This information is not intended to replace advice given to you by your health care provider. Make sure you discuss any questions you have with your health care provider. Document Revised: 08/17/2020 Document Reviewed: 08/17/2020 Elsevier Patient Education  2024 [employer].

## 2022-09-27 NOTE — Progress Notes (Signed)
MEDICARE ANNUAL WELLNESS VISIT  09/27/2022  Telephone Visit Disclaimer This Medicare AWV was conducted by telephone due to national recommendations for restrictions regarding the COVID-19 Pandemic (e.g. social distancing).  I verified, using two identifiers, that I am speaking with [name] or their authorized healthcare agent. I discussed the limitations, risks, security, and privacy concerns of performing an evaluation and management service by telephone and the potential availability of an in-person appointment in the future. The patient expressed understanding and agreed to proceed.  Location of Patient: Home Location of Provider (nurse):  In the office.  Subjective:    [name] is a 77 y.o. male patient of Everrett Coombe, DO who had a Medicare Annual Wellness Visit today via telephone. [name] is Retired and lives alone. he has 4 living children. he reports that he is socially active and does interact with friends/family regularly. he is minimally physically active and enjoys watching television and going to the [employer].  Patient Care Team: Everrett Coombe, DO as PCP - General (Family Medicine) Rinaldo Cloud, MD as Consulting Physician (Cardiology) Gabriel Carina, [facility] as Pharmacist (Pharmacist)     09/27/2022    8:26 AM 09/02/2022    6:54 AM 12/03/2021   11:32 PM 09/21/2021    1:53 PM 05/08/2021    4:03 AM 11/30/2020   11:25 AM 03/03/2020    6:09 PM  Advanced Directives  Does Patient Have a Medical Advance Directive? Yes No No No No No No  Type of Advance Directive Living will        Does patient want to make changes to medical advance directive? No - Patient declined        Would patient like information on creating a medical advance directive?   No - Patient declined No - Patient declined  No - Patient declined No - Patient declined    Hospital Utilization Over the Past 12 Months: # of hospitalizations or ER visits: 2 # of surgeries: 0  Review of Systems     Patient reports that his overall health is unchanged compared to last year.  History obtained from chart review and the patient  Patient Reported Readings (BP, Pulse, CBG, Weight, etc) none  Pain Assessment Pain : No/denies pain     Current Medications & Allergies (verified) Allergies as of 09/27/2022       Reactions   Shellfish Allergy Rash        Medication List        Accurate as of September 27, 2022  8:43 AM. If you have any questions, ask your nurse or doctor.          albuterol 108 (90 Base) MCG/ACT inhaler Commonly known as: VENTOLIN HFA Inhale 2 puffs into the lungs every 6 (six) hours as needed for wheezing or shortness of breath.   allopurinol 100 MG tablet Commonly known as: ZYLOPRIM TAKE 1/2 TABLET BY MOUTH EVERY DAY   amiodarone 200 MG tablet Commonly known as: PACERONE Take 200 mg by mouth daily. [name]   amLODipine 10 MG tablet Commonly known as: NORVASC TAKE 1 TABLET BY MOUTH EVERY DAY   aspirin EC 81 MG tablet Take 81 mg by mouth daily. Swallow whole.   colchicine 0.6 MG tablet TAKE 1 TABLET BY MOUTH EVERY DAY   docusate sodium 100 MG capsule Commonly known as: COLACE Take 100 mg by mouth daily as needed for mild constipation.   empagliflozin 10 MG Tabs tablet Commonly known as: Jardiance Take 1  tablet (10 mg total) by mouth daily before breakfast.   ferrous sulfate 325 (65 FE) MG tablet Take 325 mg by mouth daily with breakfast.   FreeStyle Libre 3 Sensor Misc USE 1 AS DIRECTED EVERY 14 DAYS TO CHECK GLUCOSE CONTINUOUSLY   furosemide 20 MG tablet Commonly known as: LASIX Take 1 tablet (20 mg total) by mouth daily.   hydrALAZINE 100 MG tablet Commonly known as: APRESOLINE Take 1 tablet (100 mg total) by mouth 3 (three) times daily.   levofloxacin 500 MG tablet Commonly known as: LEVAQUIN Take 1 tablet by mouth daily.   levothyroxine 75 MCG tablet Commonly known as: SYNTHROID Take 1 tablet (75 mcg total) by mouth  daily.   metoprolol succinate 50 MG 24 hr tablet Commonly known as: TOPROL-XL Take 50 mg by mouth daily.   omeprazole 40 MG capsule Commonly known as: PRILOSEC TAKE 1 CAPSULE BY MOUTH EVERY DAY IN THE MORNING AND AT BEDTIME   repaglinide 0.5 MG tablet Commonly known as: PRANDIN TAKE 1 TABLET (0.5 MG TOTAL) BY MOUTH 3 (THREE) TIMES DAILY BEFORE MEALS.   rosuvastatin 40 MG tablet Commonly known as: CRESTOR Take 40 mg by mouth daily.   sitaGLIPtin 50 MG tablet Commonly known as: JANUVIA Take 1 tablet (50 mg total) by mouth daily.   spironolactone 25 MG tablet Commonly known as: ALDACTONE Take 12.5 mg by mouth daily.        History (reviewed): Past Medical History:  Diagnosis Date   Arthritis    Asthma    no inhaler   Atrial fibrillation (HCC) 10/16/2020   CKD (chronic kidney disease) 04/13/2020   Coronary artery disease    cardiologist-  dr spruill; last visit 3 mos ago per pt   Enterobacter sepsis (HCC) 04/13/2020   GERD (gastroesophageal reflux disease)    History of MI (myocardial infarction)    1985   Hydronephrosis, left    Hypertension    Myocardial infarction (HCC)    Nephrolithiasis    left   Neuromuscular disorder (HCC)    TINGLING IN BOTH HANDS   Presence of tooth-root and mandibular implants    lower dental implants   Prostate cancer (HCC)    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   QT prolongation 10/16/2020   Shortness of breath    WITH EXERTION   Thigh abscess 04/13/2020   Type 2 diabetes mellitus (HCC)    Vaccine counseling 04/28/2022   Zoster 09/13/2021   Past Surgical History:  Procedure Laterality Date   BUNIONECTOMY     CYSTOSCOPY W/ RETROGRADES Left 03/14/2014   Procedure: CYSTOSCOPY WITH LEFT RETROGRADE PYELOGRAM, Left Ureteroscopy, Lweft ureteral Stent No string;  Surgeon: Danae Chen, MD;  Location: [facility];  Service: Urology;  Laterality: Left;   CYSTOSCOPY W/ URETERAL STENT PLACEMENT Bilateral 01/15/2020    Procedure: CYSTOSCOPY WITH RETROGRADE PYELOGRAM/URETERAL STENT PLACEMENT;  Surgeon: Sebastian Ache, MD;  Location: [facility];  Service: Urology;  Laterality: Bilateral;   CYSTOSCOPY/URETEROSCOPY/HOLMIUM LASER/STENT PLACEMENT Bilateral 03/17/2020   Procedure: CYSTOSCOPY/URETEROSCOPY/HOLMIUM LASER/STENT LEFT PLACEMENT;  Surgeon: Jerilee Field, MD;  Location: [facility];  Service: Urology;  Laterality: Bilateral;   HERNIA REPAIR     INCISION AND DRAINAGE Left 03/09/2020   Procedure: INCISION AND DRAINAGE LEFT HIP WITH LINER EXCHANGE;  Surgeon: Ollen Gross, MD;  Location: [facility];  Service: Orthopedics;  Laterality: Left;   IR FLUORO GUIDE CV LINE RIGHT  03/10/2020   IR RADIOLOGIST EVAL & MGMT  04/01/2020   IR RADIOLOGIST EVAL &  MGMT  04/15/2020   IR RADIOLOGIST EVAL & MGMT  04/30/2020   IR REMOVAL TUN CV CATH W/O FL  04/22/2020   IR US GUIDE BX ASP/DRAIN  03/17/2020   IR US GUIDE VASC ACCESS RIGHT  03/10/2020   PROSTATE BIOPSY     TOTAL HIP ARTHROPLASTY Left 03-20-2009   TOTAL HIP ARTHROPLASTY  04/09/2012   Procedure: TOTAL HIP ARTHROPLASTY;  Surgeon: Loanne Drilling, MD;  Location: [facility];  Service: Orthopedics;  Laterality: Right;   TOTAL KNEE ARTHROPLASTY Left 05-16-2008   Family History  Problem Relation Age of Onset   Cancer Mother        breast   Multiple sclerosis Daughter    Cancer Father        prostate   Diabetes Father    Cancer Maternal Uncle        bone   Social History   Socioeconomic History   Marital status: Widowed    Spouse name: Not on file   Number of children: 5   Years of education: 9   Highest education level: 9th grade  Occupational History   Occupation: Retired  Tobacco Use   Smoking status: Former    Packs/day: 1.00    Years: 25.00    Additional pack years: 0.00    Total pack years: 25.00    Types: Cigarettes    Quit date: 04/11/1984    Years since quitting: 38.4   Smokeless tobacco: Former    Quit date: 04/02/1985  Vaping Use   Vaping Use: Never  used  Substance and Sexual Activity   Alcohol use: Not Currently    Comment: RARE   Drug use: No   Sexual activity: Not Currently  Other Topics Concern   Not on file  Social History Narrative   Lives alone. He had five children and one has deceased. He enjoys watching television.   Social Determinants of Health   Financial Resource Strain: High Risk (09/27/2022)   Overall Financial Resource Strain (CARDIA)    Difficulty of Paying Living Expenses: Hard  Food Insecurity: No Food Insecurity (09/27/2022)   Hunger Vital Sign    Worried About Running Out of Food in the Last Year: Never true    Ran Out of Food in the Last Year: Never true  Transportation Needs: No Transportation Needs (09/27/2022)   PRAPARE - Administrator, Civil Service (Medical): No    Lack of Transportation (Non-Medical): No  Physical Activity: Sufficiently Active (09/27/2022)   Exercise Vital Sign    Days of Exercise per Week: 3 days    Minutes of Exercise per Session: 60 min  Stress: No Stress Concern Present (09/27/2022)   [employer] of Occupational Health - Occupational Stress Questionnaire    Feeling of Stress : Not at all  Social Connections: Moderately Isolated (09/27/2022)   Social Connection and Isolation Panel [NHANES]    Frequency of Communication with Friends and Family: More than three times a week    Frequency of Social Gatherings with Friends and Family: More than three times a week    Attends Religious Services: More than 4 times per year    Active Member of [employer] or Organizations: No    Attends Banker Meetings: Never    Marital Status: Widowed    Activities of Daily Living    09/27/2022    8:32 AM  In your present state of health, do you have any difficulty performing the following activities:  Hearing? 1  Comment sometimes  Vision? 1  Comment sometimes  Difficulty concentrating or making decisions? 1  Comment some forgetfullness  Walking or climbing stairs? 1   Comment sometimes  Dressing or bathing? 0  Doing errands, shopping? 0  Preparing Food and eating ? N  Using the Toilet? N  In the past six months, have you accidently leaked urine? N  Do you have problems with loss of bowel control? N  Managing your Medications? N  Managing your Finances? N  Housekeeping or managing your Housekeeping? N    Patient Education/ Literacy How often do you need to have someone help you when you read instructions, pamphlets, or other written materials from your doctor or pharmacy?: 2 - Rarely What is the last grade level you completed in school?: 9th grade  Exercise    Diet Patient reports consuming  2-3  meals a day and 2 snack(s) a day Patient reports that his primary diet is: Regular Patient reports that she does have regular access to food.   Depression Screen    09/27/2022    8:28 AM 04/28/2022    8:30 AM 12/17/2021    2:43 PM 10/26/2021   10:02 AM 09/21/2021    1:48 PM 09/13/2021    9:15 AM 03/22/2021   11:26 AM  PHQ 2/9 Scores  PHQ - 2 Score 0 0 0 0 0 0 0     Fall Risk    09/27/2022    8:27 AM 04/28/2022    8:30 AM 12/17/2021    2:43 PM 10/26/2021   10:02 AM 09/21/2021    1:48 PM  Fall Risk   Falls in the past year? 1 0 1 0 0  Number falls in past yr: 0 0 1 0 0  Injury with Fall? 0 0 1 0 0  Comment   rib    Risk for fall due to : History of fall(s);Impaired balance/gait    No Fall Risks  Follow up Falls evaluation completed;Education provided;Falls prevention discussed    Falls evaluation completed     Objective:  [name] seemed alert and oriented and he participated appropriately during our telephone visit.  Blood Pressure Weight BMI  BP Readings from Last 3 Encounters:  09/02/22 (!) 169/66  08/25/22 (!) 172/66  08/19/22 (!) 142/60   Wt Readings from Last 3 Encounters:  09/02/22 229 lb 4.5 oz (104 kg)  08/25/22 230 lb (104.3 kg)  08/19/22 231 lb 9.6 oz (105.1 kg)   BMI Readings from Last 1 Encounters:  09/02/22 31.10  kg/m    *Unable to obtain current vital signs, weight, and BMI due to telephone visit type  Hearing/Vision  [name] did not seem to have difficulty with hearing/understanding during the telephone conversation Reports that he has had a formal eye exam by an eye care professional within the past year Reports that he has not had a formal hearing evaluation within the past year *Unable to fully assess hearing and vision during telephone visit type  Cognitive Function:    09/27/2022    8:34 AM 09/21/2021    1:54 PM  6CIT Screen  What Year? 0 points 0 points  What month? 0 points 0 points  What time? 0 points 0 points  Count back from 20 0 points 0 points  Months in reverse 4 points 4 points  Repeat phrase 6 points 8 points  Total Score 10 points 12 points   (Normal:0-7, Significant for Dysfunction: >8)  Normal Cognitive Function Screening: No: he  has been experiencing some memory loss.   Immunization & Health Maintenance Record Immunization History  Administered Date(s) Administered   Fluad Quad(high Dose 65+) 01/22/2019, 01/21/2020, 03/10/2021   Influenza, High Dose Seasonal PF 02/03/2017, 04/02/2018   Moderna SARS-COV2 Booster Vaccination 03/18/2020   Moderna Sars-Covid-2 Vaccination 06/22/2019, 03/18/2020   Pfizer Covid-19 Vaccine Bivalent Booster 5y-11y 03/10/2021   Pneumococcal Conjugate-13 04/24/2019   Pneumococcal Polysaccharide-23 10/23/2018   Tdap 04/11/2015    Health Maintenance  Topic Date Due   COVID-19 Vaccine (3 - Mixed Product risk series) 05/20/2023 (Originally 04/07/2021)   Zoster Vaccines- Shingrix (1 of 2) 08/02/2023 (Originally 08/05/1964)   OPHTHALMOLOGY EXAM  10/06/2022   INFLUENZA VACCINE  11/10/2022   Diabetic kidney evaluation - Urine ACR  12/11/2022   HEMOGLOBIN A1C  02/19/2023   FOOT EXAM  08/02/2023   Diabetic kidney evaluation - eGFR measurement  08/17/2023   Medicare Annual Wellness (AWV)  09/27/2023   DTaP/Tdap/Td (2 - Td or Tdap)  04/10/2025   Pneumonia Vaccine 4+ Years old  Completed   Hepatitis C Screening  Completed   HPV VACCINES  Aged Out   Colonoscopy  Discontinued       Assessment  This is a routine wellness examination for [name].  Health Maintenance: Due or Overdue There are no preventive care reminders to display for this patient.   [name] does not need a referral for Community Assistance: Care Management:   no Social Work:    no Prescription Assistance:  no Nutrition/Diabetes Education:  no   Plan:  Personalized Goals  Goals Addressed               This Visit's Progress     Patient Stated (pt-stated)        Patient stated that he would like to be more active and build strength in his legs.       Personalized Health Maintenance & Screening Recommendations  Shingles vaccine  Lung Cancer Screening Recommended: no (Low Dose CT Chest recommended if Age 26-80 years, 20 pack-year currently smoking OR have quit w/in past 15 years) Hepatitis C Screening recommended: no HIV Screening recommended: no  Advanced Directives: Written information was not prepared per patient's request.  Referrals & Orders Orders Placed This Encounter  Procedures   AMB Referral to Community Care Coordinaton ([facility]O Patients)    Follow-up Plan Follow-up with Everrett Coombe, DO as planned Schedule shingles vaccine at the pharmacy. Medicare wellness visit in one year.  AVS printed and mailed to the patient.    I have personally reviewed and noted the following in the patient's chart:   Medical and social history Use of alcohol, tobacco or illicit drugs  Current medications and supplements Functional ability and status Nutritional status Physical activity Advanced directives List of other physicians Hospitalizations, surgeries, and ER visits in previous 12 months Vitals Screenings to include cognitive, depression, and falls Referrals and appointments  In addition, I have  reviewed and discussed with [name] certain preventive protocols, quality metrics, and best practice recommendations. A written personalized care plan for preventive services as well as general preventive health recommendations is available and can be mailed to the patient at his request.      Modesto Charon, RN BSN  09/27/2022

## 2022-09-29 ENCOUNTER — Other Ambulatory Visit: Payer: Self-pay | Admitting: Family Medicine

## 2022-09-29 ENCOUNTER — Telehealth: Payer: Self-pay | Admitting: *Deleted

## 2022-09-29 NOTE — Telephone Encounter (Signed)
   Telephone encounter was:  Successful.  09/29/2022 Name: [name] MRN: [id number] DOB: [birth year]  [name] is a 77 y.o. year old male who is a primary care patient of Everrett Coombe, DO . The community resource team was consulted for assistance with Needs a rolator walker , and a shower chair , misses some medicines and close to donut hole for some like jardiance . Also towards the end of year his daily antbiotic becomes more unaffordable   Care guide performed the following interventions: Patient provided with information about care guide support team and interviewed to confirm resource needs.  Follow Up Plan: Will follow up   Alois Cliche -[facility], Population Health [phone] [address] [place] , [place] [address] Email : [email or web address]

## 2022-09-30 ENCOUNTER — Telehealth: Payer: Self-pay | Admitting: *Deleted

## 2022-09-30 ENCOUNTER — Other Ambulatory Visit: Payer: Self-pay | Admitting: Family Medicine

## 2022-09-30 ENCOUNTER — Telehealth: Payer: Self-pay

## 2022-09-30 DIAGNOSIS — R2681 Unsteadiness on feet: Secondary | ICD-10-CM | POA: Insufficient documentation

## 2022-09-30 MED ORDER — AMBULATORY NON FORMULARY MEDICATION: 0 refills | Status: DC

## 2022-09-30 NOTE — Telephone Encounter (Signed)
   Telephone encounter was:  Unsuccessful.  09/30/2022 Name: [name] MRN: [id number] DOB: [birth year]  Unsuccessful outbound call made today to assist with:  Transportation Needs   Outreach Attempt:  2nd Attempt  A HIPAA compliant voice message was left requesting a return call.  Instructed patient to call back at [phone].  Yehuda Mao Greenauer -[facility] [place], Population Health [phone] [address] [place] , [place] [address] Email : [email or web address]

## 2022-09-30 NOTE — Telephone Encounter (Signed)
Faxed Rx for [employer] and shower bench to [employer].  Fax included insurance, rx, and Conserv[employer], museum/gallery  Phone: [phone] Fax: [phone]

## 2022-10-03 ENCOUNTER — Telehealth: Payer: Self-pay

## 2022-10-03 ENCOUNTER — Telehealth: Payer: Self-pay | Admitting: *Deleted

## 2022-10-03 NOTE — Progress Notes (Signed)
   Care Guide Note  10/03/2022 Name: [name] MRN: [id number] DOB: March 07, 1946  Referred by: Everrett Coombe, DO Reason for referral : Care Coordination (Outreach to schedule with Pharm d for med review )   [name] is a 77 y.o. year old male who is a primary care patient of Everrett Coombe, DO. CLOYCE BLANKENHORN was referred to the pharmacist for assistance related to HTN.    Successful contact was made with the patient to discuss pharmacy services including being ready for the pharmacist to call at least 5 minutes before the scheduled appointment time, to have medication bottles and any blood sugar or blood pressure readings ready for review. The patient agreed to meet with the pharmacist via with the pharmacist via telephone visit on (date/time).  10/18/2022  Penne Lash, RMA Care Guide [facility]  [place], [place] [address] Direct Dial: [phone] [email or web address]

## 2022-10-03 NOTE — Telephone Encounter (Signed)
   Telephone encounter was:  Unsuccessful.  10/03/2022 Name: KYRIAN STAGE MRN: [id number] DOB: 1945/06/05  Unsuccessful outbound call made today to assist with:  Transportation Needs   Outreach Attempt:  1st Attempt  A HIPAA compliant voice message was left requesting a return call.  Instructed patient to call back at [phone].  Yehuda Mao Greenauer -[facility] , Population Health [phone] [address] [place] , [name] [address] Email : [email or web address]

## 2022-10-03 NOTE — Progress Notes (Signed)
   Care Guide Note  10/03/2022 Name: [name] MRN: [id number] DOB: 01/24/1946  Referred by: Everrett Coombe, DO Reason for referral : Care Coordination (Outreach to schedule with Pharm d for med review )   [name] is a 77 y.o. year old male who is a primary care patient of Everrett Coombe, DO. [name] was referred to the pharmacist for assistance related to HTN, HLD, and DM.    An unsuccessful telephone outreach was attempted today to contact the patient who was referred to the pharmacy team for assistance with medication management. Additional attempts will be made to contact the patient.   Penne Lash, RMA Care Guide [facility]  [place], [place] [address] Direct Dial: [phone] [name]

## 2022-10-06 ENCOUNTER — Ambulatory Visit (INDEPENDENT_AMBULATORY_CARE_PROVIDER_SITE_OTHER): Payer: PPO | Admitting: Podiatry

## 2022-10-06 ENCOUNTER — Encounter: Payer: Self-pay | Admitting: Podiatry

## 2022-10-06 DIAGNOSIS — M79674 Pain in right toe(s): Secondary | ICD-10-CM

## 2022-10-06 DIAGNOSIS — B351 Tinea unguium: Secondary | ICD-10-CM

## 2022-10-06 DIAGNOSIS — H04123 Dry eye syndrome of bilateral lacrimal glands: Secondary | ICD-10-CM | POA: Diagnosis not present

## 2022-10-06 DIAGNOSIS — M79675 Pain in left toe(s): Secondary | ICD-10-CM

## 2022-10-06 DIAGNOSIS — Z961 Presence of intraocular lens: Secondary | ICD-10-CM | POA: Diagnosis not present

## 2022-10-06 DIAGNOSIS — H52203 Unspecified astigmatism, bilateral: Secondary | ICD-10-CM | POA: Diagnosis not present

## 2022-10-06 DIAGNOSIS — H524 Presbyopia: Secondary | ICD-10-CM | POA: Diagnosis not present

## 2022-10-06 DIAGNOSIS — E1169 Type 2 diabetes mellitus with other specified complication: Secondary | ICD-10-CM | POA: Diagnosis not present

## 2022-10-06 LAB — HM DIABETES EYE EXAM

## 2022-10-06 NOTE — Progress Notes (Signed)
Subjective:   Patient ID: [name], male   DOB: 77 y.o.   MRN: [id number]   HPI Patient presents with caregiver with very thickened deformed nailbeds 1-5 both feet that get incurvated and can be sore at times   ROS      Objective:  Physical Exam  Neurovascular status found to be intact with thick yellow brittle nailbeds 1-5 both feet that are tender with ingrown left big toe     Assessment:  Chronic mycotic nail infection 1-5 both feet with pain     Plan:  Debridement nailbeds 1-5 both feet no angiogenic bleeding reappoint routine care

## 2022-10-06 NOTE — Patient Instructions (Signed)

## 2022-10-10 DIAGNOSIS — H35373 Puckering of macula, bilateral: Secondary | ICD-10-CM | POA: Diagnosis not present

## 2022-10-10 DIAGNOSIS — H4323 Crystalline deposits in vitreous body, bilateral: Secondary | ICD-10-CM | POA: Diagnosis not present

## 2022-10-12 ENCOUNTER — Encounter: Payer: Self-pay | Admitting: Podiatry

## 2022-10-12 ENCOUNTER — Ambulatory Visit (INDEPENDENT_AMBULATORY_CARE_PROVIDER_SITE_OTHER): Payer: PPO | Admitting: Podiatry

## 2022-10-12 DIAGNOSIS — L600 Ingrowing nail: Secondary | ICD-10-CM | POA: Diagnosis not present

## 2022-10-12 NOTE — Progress Notes (Signed)
Subjective:   Patient ID: [name], male   DOB: 77 y.o.   MRN: [id number]   HPI Patient presents painful ingrown toenail left big toe lateral border that hurts with shoe gear or sheets at night with irritation no drainage   ROS      Objective:  Physical Exam  Neurovascular status intact with incurvation of the lateral border left big toe painful when pressed with mild erythema no drainage noted     Assessment:  Chronic ingrown toenail deformity left hallux did not respond to debridement     Plan:  Recommended correction of deformity explained procedure risk signed consent form and today I infiltrated 60 mg like Marcaine mixture sterile prep done using sterile instrumentation remove the lateral border exposed matrix applied phenol 3 applications 30 seconds followed by alcohol lavage sterile dressing gave instructions on soaks and wear dressing for 24 hours take it off earlier if throbbing were to occur.

## 2022-10-14 DIAGNOSIS — Z8546 Personal history of malignant neoplasm of prostate: Secondary | ICD-10-CM | POA: Diagnosis not present

## 2022-10-18 ENCOUNTER — Other Ambulatory Visit: Payer: PPO

## 2022-10-19 ENCOUNTER — Ambulatory Visit: Payer: PPO | Admitting: Podiatry

## 2022-10-19 DIAGNOSIS — Z8546 Personal history of malignant neoplasm of prostate: Secondary | ICD-10-CM | POA: Diagnosis not present

## 2022-10-19 DIAGNOSIS — R351 Nocturia: Secondary | ICD-10-CM | POA: Diagnosis not present

## 2022-10-19 DIAGNOSIS — N401 Enlarged prostate with lower urinary tract symptoms: Secondary | ICD-10-CM | POA: Diagnosis not present

## 2022-10-19 NOTE — Progress Notes (Signed)
10/19/2022 Name: [name] MRN: [id number] DOB: 04-28-1945  Chief Complaint  Patient presents with   Medication Management   [name] is a 77 y.o. year old male who presented for a telephone visit.   They were referred to the pharmacist by their PCP for assistance in managing medication access.   Subjective:  Care Team: Primary Care Provider: Everrett Coombe, DO ; Next Scheduled Visit: 10/25  Medication Access/Adherence  Current Pharmacy:  [id number] [place], [place] - [address] [place] [address] Phone: [phone] Fax: [phone]  [facility] [place] [facility] [place] [facility] [address] [place] [address] Phone: [phone] Fax: [phone]  [facility] - [facility] [address] [place] [address] Phone: [phone] Fax: [phone]  [facility] [place] - [facility] [address] [place] [address] Phone: [phone] Fax: [phone]  -Patient reports affordability concerns with their medications: Yes -Jardiance, Januvia and Colchicine -Patient reports access/transportation concerns to their pharmacy: No -Patient reports adherence concerns with their medications:  Yes  -Patient is not taking cochicine due to cost, and he is also not taking levothyroxine and unsure if he should be or not  Diabetes: Current medications: Jardiance 10mg  daily, januvia 50mg  daily, repaglinide 0.5mg  TID -Patient states repaglinide was decreased to taking once daily  -Using Freestyle Libre 3 for CGM -Current FBG readings:  102, 106 -Patient denies hypoglycemic s/sx including dizziness, shakiness, sweating.  -Patient denies hyperglycemic symptoms including polyuria, polydipsia, polyphagia, nocturia, neuropathy, blurred vision. -Current medication access support: None at this time  Hypertension: Current medications: spironolactone  12.5mg  daily, metoprolol succ 50mg  daily, hydralazine 100mg  TID, furosemide 20mg  daily, amlodipine 10mg  daily  -Patient has a validated, automated, upper arm home BP cuff -Current blood pressure readings readings: 143-145/50-60 -Patient denies hypotensive s/sx including dizziness, lightheadedness.  -Patient denies hypertensive symptoms including headache, chest pain, shortness of breath  Hyperlipidemia/ASCVD Risk Reduction Current lipid lowering medications: Atorvastatin 40mg  daily  Antiplatelet regimen: ASA 81mg  daily   Objective: Lab Results  Component Value Date   HGBA1C 5.9 (A) 08/19/2022   Lab Results  Component Value Date   CREATININE 1.94 (H) 08/17/2022   BUN 39 (H) 08/17/2022   NA 140 08/17/2022   K 4.2 08/17/2022   CL 108 (H) 08/17/2022   CO2 20 08/17/2022   Lab Results  Component Value Date   CHOL 121 12/10/2021   HDL 37.40 (L) 12/10/2021   LDLCALC 54 12/10/2021   LDLDIRECT 132 (H) 10/24/2019   TRIG 146.0 12/10/2021   CHOLHDL 3 12/10/2021   Medications Reviewed Today     Reviewed by Lenna Gilford, RPH (Pharmacist) on 10/18/22 at 1520  Med List Status: <None>   Medication Order Taking? Sig Documenting Provider Last Dose Status Informant  albuterol (VENTOLIN HFA) 108 (90 Base) MCG/ACT inhaler [id number] Yes Inhale 2 puffs into the lungs every 6 (six) hours as needed for wheezing or shortness of breath. Everrett Coombe, DO Taking Active   allopurinol (ZYLOPRIM) 100 MG tablet [id number] Yes TAKE 1/2 TABLET BY MOUTH DAILY Everrett Coombe, DO Taking Active   AMBULATORY NON FORMULARY MEDICATION [id number]  Please provide rollator walker with hand brakes and seat.  Dx: R26.81 Gait instability Everrett Coombe, DO  Active   AMBULATORY NON FORMULARY MEDICATION [id number]  Please provide shower bench.  R26.81 Gait instability Everrett Coombe, DO  Active   amiodarone (PACERONE) 200 MG tablet [id number] Yes Take 200 mg by mouth  daily. [name] [provider] Taking Active    amLODipine (NORVASC) 10 MG tablet [id number] Yes TAKE 1 TABLET BY MOUTH EVERY DAY Everrett Coombe, DO Taking Active   aspirin EC 81 MG tablet [id number] Yes Take 81 mg by mouth daily. Swallow whole. [provider] Taking Active   atorvastatin (LIPITOR) 40 MG tablet [id number] Yes Take 40 mg by mouth daily. [provider] Taking Active   colchicine 0.6 MG tablet [id number] No TAKE 1 TABLET BY MOUTH EVERY DAY  Patient not taking: Reported on 09/27/2022   Everrett Coombe, DO Not Taking Active   Continuous Glucose Sensor (FREESTYLE LIBRE 3 SENSOR) [place] [id number] Yes USE 1 AS DIRECTED EVERY 14 DAYS TO CHECK GLUCOSE CONTINUOUSLY Everrett Coombe, DO Taking Active   docusate sodium (COLACE) 100 MG capsule [id number] Yes Take 100 mg by mouth daily as needed for mild constipation. [provider] Taking Active   empagliflozin (JARDIANCE) 10 MG TABS tablet [id number] Yes Take 1 tablet (10 mg total) by mouth daily before breakfast. Altamese Boomer, MD Taking Active   ferrous sulfate 325 (65 FE) MG tablet [id number] Yes Take 325 mg by mouth daily with breakfast. [provider] Taking Active   furosemide (LASIX) 20 MG tablet [id number] Yes Take 1 tablet (20 mg total) by mouth daily. Alver Sorrow, NP Taking Active   hydrALAZINE (APRESOLINE) 100 MG tablet [id number] Yes Take 1 tablet (100 mg total) by mouth 3 (three) times daily. Alver Sorrow, NP Taking Active   levofloxacin (LEVAQUIN) 500 MG tablet [id number] Yes Take 1 tablet by mouth daily. Daiva Eves, Lisette Grinder, MD Taking Active   levothyroxine (SYNTHROID) 75 MCG tablet [id number] No Take 1 tablet (75 mcg total) by mouth daily.  Patient not taking: Reported on 10/18/2022   Altamese San Perlita, MD Not Taking Active   metoprolol succinate (TOPROL-XL) 50 MG 24 hr tablet [id number] Yes Take 50 mg by mouth daily. [provider] Taking Active   omeprazole (PRILOSEC) 40 MG capsule [id number] Yes TAKE 1 CAPSULE BY MOUTH EVERY DAY IN  THE MORNING AND AT BEDTIME Everrett Coombe, DO Taking Active   repaglinide (PRANDIN) 0.5 MG tablet [id number] Yes TAKE 1 TABLET (0.5 MG TOTAL) BY MOUTH 3 (THREE) TIMES DAILY BEFORE MEALS. Reather Littler, MD Taking Active            Med Note Littie Deeds, [facility]   Tue Oct 18, 2022  3:04 PM) Decreased to once daily  sitaGLIPtin (JANUVIA) 50 MG tablet [id number] Yes Take 1 tablet (50 mg total) by mouth daily. Catarina Hartshorn, MD Taking Active Child  spironolactone (ALDACTONE) 25 MG tablet [id number] Yes Take 12.5 mg by mouth daily. [provider] Taking Active            Assessment/Plan:   Medication Access/Adherence: -Based on household income, patient would qualify for Colcrys 0.6mg  daily PAP through [facility]; will coordinate with medication assistance team to get application process initiated -Consulting Dr. Roosevelt Locks on levothyroxine and will inform patient whether he is to be taking or not  Diabetes: - Currently controlled - Recommend to continue current regimen - Consulting Dr. Roosevelt Locks to verify repaglinide dosing - Meets financial criteria for Jardiance and Januvia patient assistance program through [employer]. Will collaborate with provider, CPhT, and patient to pursue assistance.   Hypertension: - Currently uncontrolled - Patient is followed by advanced hypertension clinic and sees them again in October - Reviewed appropriate blood pressure monitoring technique and reviewed goal blood pressure. Recommended to continue to  check home blood pressure and heart rate daily and record values.  Hyperlipidemia/ASCVD Risk Reduction: - Currently controlled.  - Continue current regimen  Follow Up Plan: Will consult patient regarding levothyroxine and will monitor PAP progress to get him and provider(s) informed  Lenna Gilford, PharmD, DPLA

## 2022-10-20 ENCOUNTER — Ambulatory Visit: Payer: PPO | Admitting: Podiatry

## 2022-10-20 ENCOUNTER — Encounter: Payer: Self-pay | Admitting: Podiatry

## 2022-10-20 DIAGNOSIS — L03032 Cellulitis of left toe: Secondary | ICD-10-CM

## 2022-10-20 NOTE — Progress Notes (Signed)
Subjective:   Patient ID: [name], male   DOB: 77 y.o.   MRN: [id number]   HPI Patient states he is developed a blister on top of his left big toe and it is irritative and just started in the last couple days   ROS      Objective:  Physical Exam  Neuro vas scaler status intact a blister on the lateral side of the left hallux proximal to where we did the procedure on the toe localized no proximal edema erythema drainage noted     Assessment:  Blister slight abscess left dorsal hallux localized     Plan:  Reviewed with patient sterile opening of the area of the fluid with sterile no no pus was noted I applied sterile dressing instructed on continued soaks and it should heal uneventfully.  Encouraged him if any issues were to occur swelling pain or other pathology to let us know immediately

## 2022-10-24 ENCOUNTER — Encounter: Payer: Self-pay | Admitting: Family Medicine

## 2022-10-24 ENCOUNTER — Telehealth: Payer: Self-pay

## 2022-10-24 NOTE — Progress Notes (Signed)
   10/24/2022  Patient ID: [name], male   DOB: [birth year], 77 y.o.   MRN: [id number]  S/O Patient outreach to follow-up with [name] after consulting Dr. Roosevelt Locks on current medication reigmen  Medication Access/Adherence -Per endo (Dr. Roosevelt Locks), patient is to be taking levothyroxine 75mg .  I contacted the pharmacy to have them refill this, but patient picked up a 90 day supply on 5/12. -Endocrinology also intended for patient to stop Prandin.  Jardiance was prescribed to be taken if covered; and if not, he is to take Januvia.  A/P  Medication Access/Adherence -Patient was able to locate levothyroxine 75mg  and states he HAS been taking this -He also has 1 month on hand of Jardiance and Januvia.  I educated him to continue to take [place] but hold Januvia.  Working on PAP for [employer]; but if this does not get approved, he can switch back to Januvia for cost-savings. -Patient is aware to no longer take Prandin  Follow-up:  PAP applications for Colcrys and Jardiance were mailed to patient.  I will follow up to make sure these were received and that he is working on them in 7-10 days.  Lenna Gilford, PharmD, DPLA

## 2022-10-25 ENCOUNTER — Telehealth: Payer: Self-pay

## 2022-10-25 ENCOUNTER — Other Ambulatory Visit (HOSPITAL_COMMUNITY): Payer: Self-pay

## 2022-10-25 NOTE — Telephone Encounter (Signed)
-----   Message from Lenna Gilford sent at 10/19/2022 12:14 PM EDT ----- Peri Jefferson afternoon,  Mr. Sharpley would qualify for PAP for his Jardiance 10mg  daily, Januvia 50mg  daily, and Colcrys 0.6mg  daily.  Would you all please start the application process for him?  Thank you!  Lenna Gilford, PharmD, DPLA

## 2022-10-25 NOTE — Telephone Encounter (Signed)
PAP application for JARDIANCE(BICARES), MERCK(JANUVIA) AND COLCRYS(TAKEDA)  has been mailed to pt home.   I will fax PCP pages once I receive pt pages   PLEASE BE ADVISED

## 2022-10-27 ENCOUNTER — Telehealth: Payer: Self-pay | Admitting: Podiatry

## 2022-10-27 NOTE — Telephone Encounter (Signed)
Pt has formed a blister on the great toe after an ingrown nail was removed. There is no pain but he is a diabetic & was concerned; he has been soaking his foot & wanted to know if he needs to be seen or continue at home treatment. He is a [facility] pt. Please advise

## 2022-10-29 ENCOUNTER — Encounter (HOSPITAL_COMMUNITY): Payer: Self-pay

## 2022-10-29 ENCOUNTER — Emergency Department (HOSPITAL_COMMUNITY)
Admission: EM | Admit: 2022-10-29 | Discharge: 2022-10-29 | Disposition: A | Discharge status: Home / Self Care | Payer: PPO | Attending: Emergency Medicine | Admitting: Emergency Medicine

## 2022-10-29 ENCOUNTER — Other Ambulatory Visit: Payer: Self-pay

## 2022-10-29 DIAGNOSIS — M79675 Pain in left toe(s): Secondary | ICD-10-CM | POA: Insufficient documentation

## 2022-10-29 DIAGNOSIS — Z4801 Encounter for change or removal of surgical wound dressing: Secondary | ICD-10-CM | POA: Diagnosis not present

## 2022-10-29 DIAGNOSIS — Z7984 Long term (current) use of oral hypoglycemic drugs: Secondary | ICD-10-CM | POA: Insufficient documentation

## 2022-10-29 DIAGNOSIS — Z79899 Other long term (current) drug therapy: Secondary | ICD-10-CM | POA: Insufficient documentation

## 2022-10-29 DIAGNOSIS — E1122 Type 2 diabetes mellitus with diabetic chronic kidney disease: Secondary | ICD-10-CM | POA: Diagnosis not present

## 2022-10-29 DIAGNOSIS — I129 Hypertensive chronic kidney disease with stage 1 through stage 4 chronic kidney disease, or unspecified chronic kidney disease: Secondary | ICD-10-CM | POA: Diagnosis not present

## 2022-10-29 DIAGNOSIS — N189 Chronic kidney disease, unspecified: Secondary | ICD-10-CM | POA: Diagnosis not present

## 2022-10-29 DIAGNOSIS — Z7982 Long term (current) use of aspirin: Secondary | ICD-10-CM | POA: Diagnosis not present

## 2022-10-29 DIAGNOSIS — Z5189 Encounter for other specified aftercare: Secondary | ICD-10-CM

## 2022-10-29 DIAGNOSIS — I2510 Atherosclerotic heart disease of native coronary artery without angina pectoris: Secondary | ICD-10-CM | POA: Insufficient documentation

## 2022-10-29 DIAGNOSIS — Z4800 Encounter for change or removal of nonsurgical wound dressing: Secondary | ICD-10-CM | POA: Insufficient documentation

## 2022-10-29 NOTE — Discharge Instructions (Signed)
Exam was reassuring, I recommend keeping the toe clean, clean and change the dressings once daily, recommend applying antiseptic ointment to the area daily.  May use over-the-counter pain medication as needed.  Follow-up with podiatry for further evaluation.  Come back to the emergency department if you develop chest pain, shortness of breath, severe abdominal pain, uncontrolled nausea, vomiting, diarrhea.

## 2022-10-29 NOTE — ED Provider Notes (Signed)
[place] EMERGENCY DEPARTMENT AT [facility] Provider Note   CSN: [id number] Arrival date & time: 10/29/22  0102     History  Chief Complaint  Patient presents with   Toe Pain    [name] is a 77 y.o. male.  HPI   Patient with medical history including hypertension, type 2 diabetes, CKD, atrial fibrillation, septic joint presenting with complaints of left great toe pain, states that he saw his podiatrist 2 weeks ago because he had a large pimple on the side of his great toe, he states that the podiatrist drained it and he was sent home.  He states since then he has been soaking his foot in Epsom salt and keeping it clean.  He states that starting today he noticed some discharge within his sock, and he notes that his wound looks little abnormal, denies any worsening pain, no redness, no fevers chills no recent trauma to the area.  States that he is can to see his podiatrist on Monday for reassessment.  Patient states he came because he wanted make sure everything was okay  Reviewed patient's chart patient had bacteremia which seeded into his prostatic joints, currently followed by ID is on Levaquin, saw his podiatrist 2 weeks ago, underwent I&D, was not started on additional antibiotics  Home Medications Prior to Admission medications   Medication Sig Start Date End Date Taking? Authorizing Provider  albuterol (VENTOLIN HFA) 108 (90 Base) MCG/ACT inhaler Inhale 2 puffs into the lungs every 6 (six) hours as needed for wheezing or shortness of breath. 06/09/21   Everrett Coombe, DO  allopurinol (ZYLOPRIM) 100 MG tablet TAKE 1/2 TABLET BY MOUTH DAILY 09/29/22   Everrett Coombe, DO  AMBULATORY NON FORMULARY MEDICATION Please provide rollator walker with hand brakes and seat.  Dx: R26.81 Gait instability 09/30/22   Everrett Coombe, DO  AMBULATORY NON FORMULARY MEDICATION Please provide shower bench.  R26.81 Gait instability 09/30/22   Everrett Coombe, DO  amiodarone (PACERONE) 200  MG tablet Take 200 mg by mouth daily. [name]    [provider]  amLODipine (NORVASC) 10 MG tablet TAKE 1 TABLET BY MOUTH EVERY DAY 08/24/22   Everrett Coombe, DO  aspirin EC 81 MG tablet Take 81 mg by mouth daily. Swallow whole.    [provider]  atorvastatin (LIPITOR) 40 MG tablet Take 40 mg by mouth daily.    [provider]  colchicine 0.6 MG tablet TAKE 1 TABLET BY MOUTH EVERY DAY 05/20/22   Everrett Coombe, DO  Continuous Glucose Sensor (FREESTYLE LIBRE 3 SENSOR) MISC USE 1 AS DIRECTED EVERY 14 DAYS TO CHECK GLUCOSE CONTINUOUSLY 08/02/22   Everrett Coombe, DO  docusate sodium (COLACE) 100 MG capsule Take 100 mg by mouth daily as needed for mild constipation.    [provider]  empagliflozin (JARDIANCE) 10 MG TABS tablet Take 10 mg by mouth daily.    [provider]  ferrous sulfate 325 (65 FE) MG tablet Take 325 mg by mouth daily with breakfast.    [provider]  furosemide (LASIX) 20 MG tablet Take 1 tablet (20 mg total) by mouth daily. 07/28/22 07/23/23  Alver Sorrow, NP  hydrALAZINE (APRESOLINE) 100 MG tablet Take 1 tablet (100 mg total) by mouth 3 (three) times daily. 09/09/22   Alver Sorrow, NP  levofloxacin (LEVAQUIN) 500 MG tablet Take 1 tablet by mouth daily. 04/28/22   Randall Hiss, MD  levothyroxine (SYNTHROID) 75 MCG tablet Take 1 tablet (  75 mcg total) by mouth daily. 08/19/22   Altamese Sheridan, MD  metoprolol succinate (TOPROL-XL) 50 MG 24 hr tablet Take 50 mg by mouth daily. 08/09/20   [provider]  omeprazole (PRILOSEC) 40 MG capsule TAKE 1 CAPSULE BY MOUTH EVERY DAY IN THE MORNING AND AT BEDTIME 09/20/22   Everrett Coombe, DO  spironolactone (ALDACTONE) 25 MG tablet Take 12.5 mg by mouth daily. 07/14/22   [provider]      Allergies    Shellfish allergy    Review of Systems   Review of Systems  Constitutional:  Negative for chills and fever.  Respiratory:  Negative for shortness of  breath.   Cardiovascular:  Negative for chest pain.  Gastrointestinal:  Negative for abdominal pain.  Skin:  Positive for wound.  Neurological:  Negative for headaches.    Physical Exam Updated Vital Signs BP (!) 156/56 (BP Location: Right Arm)   Pulse 77   Temp 98.4 F (36.9 C) (Oral)   Resp 18   Ht 6' (1.829 m)   Wt 104.3 kg   SpO2 98%   BMI 31.19 kg/m  Physical Exam Vitals and nursing note reviewed.  Constitutional:      General: He is not in acute distress.    Appearance: He is not ill-appearing.  HENT:     Head: Normocephalic and atraumatic.     Nose: No congestion.  Eyes:     Conjunctiva/sclera: Conjunctivae normal.  Cardiovascular:     Rate and Rhythm: Normal rate and regular rhythm.     Pulses: Normal pulses.  Pulmonary:     Effort: Pulmonary effort is normal.  Musculoskeletal:     Comments: Focused exam of the left foot reveals skin discoloration at the distal lateral aspect of the great toe, with a small open wound just distal to the PIP joint, no surrounding erythema, no drainage or discharge present, was not warm to the touch, he has full range of motion at the PIP and MTP joint, sensation intact to light touch, 2-second capillary refill, 2+ dorsal pedal pulses, please see picture for full detail  Skin:    General: Skin is warm and dry.  Neurological:     Mental Status: He is alert.  Psychiatric:        Mood and Affect: Mood normal.        ED Results / Procedures / Treatments   Labs (all labs ordered are listed, but only abnormal results are displayed) Labs Reviewed - No data to display  EKG None  Radiology No results found.  Procedures Procedures    Medications Ordered in ED Medications - No data to display  ED Course/ Medical Decision Making/ A&P                             Medical Decision Making  This patient presents to the ED for concern of toe pain, this involves an extensive number of treatment options, and is a complaint  that carries with it a high risk of complications and morbidity.  The differential diagnosis includes fracture, cellulitis, vascular injury    Additional history obtained:  Additional history obtained from N/A External records from outside source obtained and reviewed including podiatry notes, infectious disease notes   Co morbidities that complicate the patient evaluation  Bacteremia, CAD, diabetes  Social Determinants of Health:  Geriatric    Lab Tests:  I Ordered, and personally interpreted labs.  The pertinent results  include: N/A   Imaging Studies ordered:  I ordered imaging studies including N/A I independently visualized and interpreted imaging which showed N/A I agree with the radiologist interpretation   Cardiac Monitoring:  The patient was maintained on a cardiac monitor.  I personally viewed and interpreted the cardiac monitored which showed an underlying rhythm of: n/a   Medicines ordered and prescription drug management:  I ordered medication including n/a I have reviewed the patients home medicines and have made adjustments as needed  Critical Interventions:  N/a   Reevaluation:  Presents with concerns of toe pain, he had benign physical exam, he is in agreement with discharge at this time.  Consultations Obtained:  N/a    Test Considered:  X-ray-defers respiratory fractures very low at this time, no traumatic injury associated this pain, there is no bone deformities noted my exam.    Rule out Suspicion for septic arthritis low at this time as is now erythema noted around the joint itself, presentation stage of etiology.  I doubt vascular injury as is neurovascular intact on my exam.    Dispostion and problem list  After consideration of the diagnostic results and the patients response to treatment, I feel that the patent would benefit from discharge.  Toe wound-does not appear to be any active infection my exam will defer on  antibiotics, especially since patient is currently on Levaquin, will have him continue to apply topical antiseptic ointment to the area, keeping it clean, and have him follow-up with podiatry on Monday.            Final Clinical Impression(s) / ED Diagnoses Final diagnoses:  Visit for wound check    Rx / DC Orders ED Discharge Orders     None         Carroll Sage, PA-C 10/29/22 0401    Glynn Octave, MD 10/29/22 [phone]

## 2022-10-29 NOTE — ED Triage Notes (Signed)
Pt states he had an ingrown toenail removed from left great toe x 2 weeks ago and has since had pain, swelling and drainage from site of removal.

## 2022-10-31 ENCOUNTER — Ambulatory Visit (INDEPENDENT_AMBULATORY_CARE_PROVIDER_SITE_OTHER): Payer: PPO | Admitting: Podiatry

## 2022-10-31 DIAGNOSIS — T25432S Corrosion of unspecified degree of left toe(s) (nail), sequela: Secondary | ICD-10-CM

## 2022-10-31 MED ORDER — SILVER SULFADIAZINE 1 % EX CREA: 1.0000 | TOPICAL_CREAM | Freq: Every day | CUTANEOUS | 0 refills | Status: DC

## 2022-10-31 NOTE — Progress Notes (Signed)
Chief Complaint  Patient presents with   Blister    Pt came in on 10/20/2022 to get an ingrown procedure done and on 10/28/2022 he noticed a blister so he went to the ER, he has been having discharge since then.    HPI: Patient had a PNA procedure on the left hallux lateral border by Dr. Charlsie Merles in early July.  He developed reaction to the chemical and has a subsequent chemical burn to the area.  He became very concerned and went to the ER with a few days ago.  He is already on Levaquin from infectious disease for bacteremia.  He states been on this for the past 3 years.  No additional treatment was performed at the ED.  He has been putting Neosporin on the area with a bandaid.    Past Medical History:  Diagnosis Date   Arthritis    Asthma    no inhaler   Atrial fibrillation (HCC) 10/16/2020   CKD (chronic kidney disease) 04/13/2020   Coronary artery disease    cardiologist-  dr spruill; last visit 3 mos ago per pt   Enterobacter sepsis (HCC) 04/13/2020   GERD (gastroesophageal reflux disease)    History of MI (myocardial infarction)    1985   Hydronephrosis, left    Hypertension    Myocardial infarction (HCC)    Nephrolithiasis    left   Neuromuscular disorder (HCC)    TINGLING IN BOTH HANDS   Presence of tooth-root and mandibular implants    lower dental implants   Prostate cancer (HCC)    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   QT prolongation 10/16/2020   Shortness of breath    WITH EXERTION   Thigh abscess 04/13/2020   Type 2 diabetes mellitus (HCC)    Vaccine counseling 04/28/2022   Zoster 09/13/2021    Past Surgical History:  Procedure Laterality Date   BUNIONECTOMY     CYSTOSCOPY W/ RETROGRADES Left 03/14/2014   Procedure: CYSTOSCOPY WITH LEFT RETROGRADE PYELOGRAM, Left Ureteroscopy, Lweft ureteral Stent No string;  Surgeon: Danae Chen, MD;  Location: [facility];  Service: Urology;  Laterality: Left;   CYSTOSCOPY W/ URETERAL STENT PLACEMENT  Bilateral 01/15/2020   Procedure: CYSTOSCOPY WITH RETROGRADE PYELOGRAM/URETERAL STENT PLACEMENT;  Surgeon: Sebastian Ache, MD;  Location: [facility];  Service: Urology;  Laterality: Bilateral;   CYSTOSCOPY/URETEROSCOPY/HOLMIUM LASER/STENT PLACEMENT Bilateral 03/17/2020   Procedure: CYSTOSCOPY/URETEROSCOPY/HOLMIUM LASER/STENT LEFT PLACEMENT;  Surgeon: Jerilee Field, MD;  Location: [facility];  Service: Urology;  Laterality: Bilateral;   HERNIA REPAIR     INCISION AND DRAINAGE Left 03/09/2020   Procedure: INCISION AND DRAINAGE LEFT HIP WITH LINER EXCHANGE;  Surgeon: Ollen Gross, MD;  Location: [facility];  Service: Orthopedics;  Laterality: Left;   IR FLUORO GUIDE CV LINE RIGHT  03/10/2020   IR RADIOLOGIST EVAL & MGMT  04/01/2020   IR RADIOLOGIST EVAL & MGMT  04/15/2020   IR RADIOLOGIST EVAL & MGMT  04/30/2020   IR REMOVAL TUN CV CATH W/O FL  04/22/2020   IR US GUIDE BX ASP/DRAIN  03/17/2020   IR US GUIDE VASC ACCESS RIGHT  03/10/2020   PROSTATE BIOPSY     TOTAL HIP ARTHROPLASTY Left 03-20-2009   TOTAL HIP ARTHROPLASTY  04/09/2012   Procedure: TOTAL HIP ARTHROPLASTY;  Surgeon: Loanne Drilling, MD;  Location: [facility];  Service: Orthopedics;  Laterality: Right;   TOTAL KNEE ARTHROPLASTY Left 05-16-2008    Allergies  Allergen Reactions   Shellfish Allergy Rash  Physical Exam: There were no vitals filed for this visit.  On exam there is palpable pedal pulses on the left foot.  There is superficial blistering at the proximal nail fold where the PNA had been performed.  No clinical signs of infection in urine at this time.  There are some fibrous tissue present as well.  No active drainage is noted.  No active bleeding is noted.  There is tenderness to the area.  Assessment/Plan of Care: 1. Chemical burn of toe of left foot, sequela     Meds ordered this encounter  Medications   silver sulfADIAZINE (SILVADENE) 1 % cream    Sig: Apply 1 Application topically daily.    Dispense:  50 g    Refill:  0    Discussed clinical findings with patient today.  Informed the patient that the phenol that is used to perform the chemical matrixectomy can cause a chemical burn.  Most patients do not experience this but few do and need additional treatment for this.  Will have him start using Silvadene cream instead of Neosporin to the area.  Will also have him wrap with gauze instead of using Band-Aids.  He may still perform Epsom salt soaks daily for 15 to 20 minutes.  I will have him continue with this and follow-up in approximately 2 weeks for recheck.   Clerance Lav, [name], FACFAS [facility]     [address] [place], [place] [address]                Office [phone]  Fax [phone]

## 2022-11-03 NOTE — Telephone Encounter (Signed)
RECEIVED PT PAGES FOR JARDIANCE(BICARES), JANUVIA(MERCK), AND COLCRYS(TAKEDA) AND FAXED PROVIDER PAGES TO OFFICE OF C[facility]   PLEASE BE ADVISED

## 2022-11-08 ENCOUNTER — Other Ambulatory Visit: Payer: Self-pay

## 2022-11-08 ENCOUNTER — Ambulatory Visit (INDEPENDENT_AMBULATORY_CARE_PROVIDER_SITE_OTHER): Payer: PPO | Admitting: Infectious Disease

## 2022-11-08 ENCOUNTER — Encounter: Payer: Self-pay | Admitting: Infectious Disease

## 2022-11-08 VITALS — BP 169/70 | HR 59 | Temp 98.2°F | Resp 16 | Wt 219.0 lb

## 2022-11-08 DIAGNOSIS — N189 Chronic kidney disease, unspecified: Secondary | ICD-10-CM | POA: Diagnosis not present

## 2022-11-08 DIAGNOSIS — R9431 Abnormal electrocardiogram [ECG] [EKG]: Secondary | ICD-10-CM | POA: Diagnosis not present

## 2022-11-08 DIAGNOSIS — R7881 Bacteremia: Secondary | ICD-10-CM

## 2022-11-08 DIAGNOSIS — T8459XD Infection and inflammatory reaction due to other internal joint prosthesis, subsequent encounter: Secondary | ICD-10-CM | POA: Diagnosis not present

## 2022-11-08 DIAGNOSIS — T8452XD Infection and inflammatory reaction due to internal left hip prosthesis, subsequent encounter: Secondary | ICD-10-CM

## 2022-11-08 DIAGNOSIS — E1165 Type 2 diabetes mellitus with hyperglycemia: Secondary | ICD-10-CM

## 2022-11-08 DIAGNOSIS — Z96649 Presence of unspecified artificial hip joint: Secondary | ICD-10-CM | POA: Diagnosis not present

## 2022-11-08 DIAGNOSIS — M25461 Effusion, right knee: Secondary | ICD-10-CM

## 2022-11-08 MED ORDER — LEVOFLOXACIN 500 MG PO TABS: 500.0000 mg | ORAL_TABLET | Freq: Every day | ORAL | 11 refills | Status: DC

## 2022-11-08 NOTE — Progress Notes (Signed)
Subjective:  Chief complaint: For left prosthetic hip infection currently suffered on bilateral hip pain and recent knee pain and effusion   Patient ID: [name], male    DOB: 1946-02-13, 77 y.o.   MRN: [id number]  HPI  77 year old Black man with PMHx of CAD, DM2, HTN, CKD, OA, s/p left hip arthoplasty (2001), prostate cancer, PAF, left ureteral stone s/p stenting (01/2020) who was sent from ortho clinic on 11/23 by Dr Lequita Halt. He grew Enterobacter from blood, urine and hip aspirate. He is sp surgery with Irrigation and debridement, left hip, with bearing surface exchange. Enterobacter grew on cultures.  We had him scheduled to complete antibiotics on 10 January.  In the interim he developed a fluid collection in his thigh that was drained and seemed rather bloody but which also grew Enterobacter from culture.   He finished cefepime and we started bactrim DS BID he ran into problems with hyperkalemia and elevated creatinine also in the context of other potassium sparing and potassium raising medications  '  Interim narrative:   We obtained [name] though due to his deductible not having yet been met he would have to pay roughly $2k upfront.   He ended up doing that but then turned out that he need to pay $640 a month even though he had met that amount.   I then took [name] off he [name] because clinically seem d to be doing well and his CRP had normalized though his sed rate had not normalized I did come down from 140 into the 90s.  I also felt like continue him on this tetracycline would just be to cost prohibitive.  Unfortunately since he came off antibiotics he experienced recurrence of infection at his hip with increasing pain and also pus which began coming through the skin.  He was seen by Dr.  Despina Hick  who aspirated the area and sent for culture. Exact same Enterobacter grew yet again from culture in June of 2022.  After careful consideration we decided to start  levofloxacin despite risk of C. difficile colitis, Achilles tendinopathy confusion problems with blood sugar control and QT prolongation in the context of him being on amiodarone and having slight QT prolongation at baseline.  Since starting levofloxacin he to be having no adverse effects from this.  He returned to clinic and we obtained a twelve-lead EKG.  If EKG shows normal sinus rhythm with heart first-degree AV block but no significant ST or T wave changes and a QT and QTc of 429 and 451 which was largely unchanged from his last EKG obtained.  1 time when I saw [name] is having some pruritus that he did to the levofloxacin.  He continues to have this but he is able to tolerate it.  I was told by triage staff that his levofloxacin had been discontinued possibly by Dr. Everardo All but I do not see mention of this in his note indeed he was not off levofloxacin.  His pain has been stable and seems to have been improved.  He did have a flare of zoster recently which caused him to have pain in his left lower extremity and also up in his hip.  This is improved however.  He does still have itching that is worse when he goes to bed or when he is "winding down.  He previously very much would like to come off the antibiotics would like to push through 1 year from when it recurred which would be  in July.  Returns to clinic now in July a year later.  We had further discussions about continuing antibiotics versus stopping them I think in my opinion it be prudent to continue him on them for now and revisit this in 6 months because I have a high suspicion he will have recurrence once he comes off.  [name] is appears to be doing relatively well today his hip pain is stable he really only has it when he sits in his recliner and sounds when he gets up in the middle the night to go to the bathroom.  He had a right-sided knee effusion that continues to bother him but has responded to treatment with aspiration  and injection of corticosteroids with Dr. Despina Hick.  He did have an episode of transient confusion when he was driving his car and lost his way in [place] but I do not think this is related to levofloxacin because it was transient  It sounds as if his hip pain is fairly stable and largely bilateral with left not being worse than right and pain being more so when he bears weight sometimes also when he is at rest but not continuously.  He has suffered more Arthrum pain in his knee where he had a recurrent effusion has been drained again by Dr. Despina Hick and also responded to colchicine.     Past Medical History:  Diagnosis Date   Arthritis    Asthma    no inhaler   Atrial fibrillation (HCC) 10/16/2020   CKD (chronic kidney disease) 04/13/2020   Coronary artery disease    cardiologist-  dr spruill; last visit 3 mos ago per pt   Enterobacter sepsis (HCC) 04/13/2020   GERD (gastroesophageal reflux disease)    History of MI (myocardial infarction)    1985   Hydronephrosis, left    Hypertension    Myocardial infarction (HCC)    Nephrolithiasis    left   Neuromuscular disorder (HCC)    TINGLING IN BOTH HANDS   Presence of tooth-root and mandibular implants    lower dental implants   Prostate cancer (HCC)    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   QT prolongation 10/16/2020   Shortness of breath    WITH EXERTION   Thigh abscess 04/13/2020   Type 2 diabetes mellitus (HCC)    Vaccine counseling 04/28/2022   Zoster 09/13/2021    Past Surgical History:  Procedure Laterality Date   BUNIONECTOMY     CYSTOSCOPY W/ RETROGRADES Left 03/14/2014   Procedure: CYSTOSCOPY WITH LEFT RETROGRADE PYELOGRAM, Left Ureteroscopy, [place] ureteral Stent No string;  Surgeon: Danae Chen, MD;  Location: [facility] [place];  Service: Urology;  Laterality: Left;   CYSTOSCOPY W/ URETERAL STENT PLACEMENT Bilateral 01/15/2020   Procedure: CYSTOSCOPY WITH RETROGRADE PYELOGRAM/URETERAL STENT  PLACEMENT;  Surgeon: Sebastian Ache, MD;  Location: [facility];  Service: Urology;  Laterality: Bilateral;   CYSTOSCOPY/URETEROSCOPY/HOLMIUM LASER/STENT PLACEMENT Bilateral 03/17/2020   Procedure: CYSTOSCOPY/URETEROSCOPY/HOLMIUM LASER/STENT LEFT PLACEMENT;  Surgeon: Jerilee Field, MD;  Location: [facility];  Service: Urology;  Laterality: Bilateral;   HERNIA REPAIR     INCISION AND DRAINAGE Left 03/09/2020   Procedure: INCISION AND DRAINAGE LEFT HIP WITH LINER EXCHANGE;  Surgeon: Ollen Gross, MD;  Location: [facility];  Service: Orthopedics;  Laterality: Left;   IR FLUORO GUIDE CV LINE RIGHT  03/10/2020   IR RADIOLOGIST EVAL & MGMT  04/01/2020   IR RADIOLOGIST EVAL & MGMT  04/15/2020   IR RADIOLOGIST EVAL & MGMT  04/30/2020  IR REMOVAL TUN CV CATH W/O FL  04/22/2020   IR US GUIDE BX ASP/DRAIN  03/17/2020   IR US GUIDE VASC ACCESS RIGHT  03/10/2020   PROSTATE BIOPSY     TOTAL HIP ARTHROPLASTY Left 03-20-2009   TOTAL HIP ARTHROPLASTY  04/09/2012   Procedure: TOTAL HIP ARTHROPLASTY;  Surgeon: Loanne Drilling, MD;  Location: [facility];  Service: Orthopedics;  Laterality: Right;   TOTAL KNEE ARTHROPLASTY Left 05-16-2008    Family History  Problem Relation Age of Onset   Cancer Mother        breast   Multiple sclerosis Daughter    Cancer Father        prostate   Diabetes Father    Cancer Maternal Uncle        bone      Social History   Socioeconomic History   Marital status: Widowed    Spouse name: Not on file   Number of children: 5   Years of education: 9   Highest education level: 9th grade  Occupational History   Occupation: Retired  Tobacco Use   Smoking status: Former    Current packs/day: 0.00    Average packs/day: 1 pack/day for 25.0 years (25.0 ttl pk-yrs)    Types: Cigarettes    Start date: 04/12/1959    Quit date: 04/11/1984    Years since quitting: 38.6   Smokeless tobacco: Former    Quit date: 04/02/1985  Vaping Use   Vaping status: Never Used  Substance and Sexual  Activity   Alcohol use: Not Currently    Comment: RARE   Drug use: No   Sexual activity: Not Currently  Other Topics Concern   Not on file  Social History Narrative   Lives alone. He had five children and one has deceased. He enjoys watching television.   Social Determinants of Health   Financial Resource Strain: Medium Risk (09/29/2022)   Overall Financial Resource Strain (CARDIA)    Difficulty of Paying Living Expenses: Somewhat hard  Food Insecurity: No Food Insecurity (09/27/2022)   Hunger Vital Sign    Worried About Running Out of Food in the Last Year: Never true    Ran Out of Food in the Last Year: Never true  Transportation Needs: No Transportation Needs (09/27/2022)   PRAPARE - Administrator, Civil Service (Medical): No    Lack of Transportation (Non-Medical): No  Physical Activity: Sufficiently Active (09/27/2022)   Exercise Vital Sign    Days of Exercise per Week: 3 days    Minutes of Exercise per Session: 60 min  Stress: No Stress Concern Present (09/27/2022)   [employer] of Occupational Health - Occupational Stress Questionnaire    Feeling of Stress : Not at all  Social Connections: Moderately Isolated (09/27/2022)   Social Connection and Isolation Panel [NHANES]    Frequency of Communication with Friends and Family: More than three times a week    Frequency of Social Gatherings with Friends and Family: More than three times a week    Attends Religious Services: More than 4 times per year    Active Member of [employer] or Organizations: No    Attends Banker Meetings: Never    Marital Status: Widowed    Allergies  Allergen Reactions   Shellfish Allergy Rash     Current Outpatient Medications:    albuterol (VENTOLIN HFA) 108 (90 Base) MCG/ACT inhaler, Inhale 2 puffs into the lungs every 6 (six) hours as needed for wheezing  or shortness of breath., Disp: 8 g, Rfl: 0   allopurinol (ZYLOPRIM) 100 MG tablet, TAKE 1/2 TABLET BY MOUTH DAILY,  Disp: 45 tablet, Rfl: 1   AMBULATORY NON FORMULARY MEDICATION, Please provide rollator walker with hand brakes and seat.  Dx: R26.81 Gait instability, Disp: 1 Device, Rfl: 0   AMBULATORY NON FORMULARY MEDICATION, Please provide shower bench.  R26.81 Gait instability, Disp: 1 Device, Rfl: 0   amiodarone (PACERONE) 200 MG tablet, Take 200 mg by mouth daily. [name], Disp: , Rfl:    amLODipine (NORVASC) 10 MG tablet, TAKE 1 TABLET BY MOUTH EVERY DAY, Disp: 90 tablet, Rfl: 1   aspirin EC 81 MG tablet, Take 81 mg by mouth daily. Swallow whole., Disp: , Rfl:    atorvastatin (LIPITOR) 40 MG tablet, Take 40 mg by mouth daily., Disp: , Rfl:    colchicine 0.6 MG tablet, TAKE 1 TABLET BY MOUTH EVERY DAY, Disp: 90 tablet, Rfl: 1   Continuous Glucose Sensor (FREESTYLE LIBRE 3 SENSOR) MISC, USE 1 AS DIRECTED EVERY 14 DAYS TO CHECK GLUCOSE CONTINUOUSLY, Disp: 2 each, Rfl: 3   docusate sodium (COLACE) 100 MG capsule, Take 100 mg by mouth daily as needed for mild constipation., Disp: , Rfl:    empagliflozin (JARDIANCE) 10 MG TABS tablet, Take 10 mg by mouth daily., Disp: , Rfl:    ferrous sulfate 325 (65 FE) MG tablet, Take 325 mg by mouth daily with breakfast., Disp: , Rfl:    furosemide (LASIX) 20 MG tablet, Take 1 tablet (20 mg total) by mouth daily., Disp: 90 tablet, Rfl: 3   hydrALAZINE (APRESOLINE) 100 MG tablet, Take 1 tablet (100 mg total) by mouth 3 (three) times daily., Disp: 270 tablet, Rfl: 3   levofloxacin (LEVAQUIN) 500 MG tablet, Take 1 tablet by mouth daily., Disp: 30 tablet, Rfl: 11   levothyroxine (SYNTHROID) 75 MCG tablet, Take 1 tablet (75 mcg total) by mouth daily., Disp: 90 tablet, Rfl: 0   metoprolol succinate (TOPROL-XL) 50 MG 24 hr tablet, Take 50 mg by mouth daily., Disp: , Rfl:    omeprazole (PRILOSEC) 40 MG capsule, TAKE 1 CAPSULE BY MOUTH EVERY DAY IN THE MORNING AND AT BEDTIME, Disp: 180 capsule, Rfl: 0   silver sulfADIAZINE (SILVADENE) 1 % cream, Apply 1 Application topically  daily., Disp: 50 g, Rfl: 0   spironolactone (ALDACTONE) 25 MG tablet, Take 12.5 mg by mouth daily., Disp: , Rfl:    Review of Systems  Constitutional:  Negative for activity change, appetite change, chills, diaphoresis, fatigue, fever and unexpected weight change.  HENT:  Negative for congestion, rhinorrhea, sinus pressure, sneezing, sore throat and trouble swallowing.   Eyes:  Negative for photophobia and visual disturbance.  Respiratory:  Negative for cough, chest tightness, shortness of breath, wheezing and stridor.   Cardiovascular:  Negative for chest pain, palpitations and leg swelling.  Gastrointestinal:  Negative for abdominal distention, abdominal pain, anal bleeding, blood in stool, constipation, diarrhea, nausea and vomiting.  Genitourinary:  Negative for difficulty urinating, dysuria, flank pain and hematuria.  Musculoskeletal:  Positive for arthralgias and joint swelling. Negative for back pain, gait problem and myalgias.  Skin:  Negative for color change, pallor, rash and wound.  Neurological:  Negative for dizziness, tremors, weakness, light-headedness and headaches.  Hematological:  Negative for adenopathy. Does not bruise/bleed easily.  Psychiatric/Behavioral:  Negative for agitation, behavioral problems, confusion, decreased concentration, dysphoric mood, hallucinations, sleep disturbance and suicidal ideas.        Objective:   Physical  Exam Constitutional:      Appearance: He is well-developed.  HENT:     Head: Normocephalic and atraumatic.  Eyes:     Conjunctiva/sclera: Conjunctivae normal.  Cardiovascular:     Rate and Rhythm: Normal rate and regular rhythm.  Pulmonary:     Effort: Pulmonary effort is normal. No respiratory distress.     Breath sounds: No wheezing.  Abdominal:     General: There is no distension.     Palpations: Abdomen is soft.  Musculoskeletal:        General: No tenderness. Normal range of motion.     Cervical back: Normal range of motion  and neck supple.  Skin:    General: Skin is warm and dry.     Coloration: Skin is not pale.     Findings: No erythema or rash.  Neurological:     General: No focal deficit present.     Mental Status: He is alert and oriented to person, place, and time.  Psychiatric:        Mood and Affect: Mood normal.        Behavior: Behavior normal.        Thought Content: Thought content normal.        Judgment: Judgment normal.           Assessment & Plan:  Prosthetic joint infection:  Recheck sed rate CRP BMP with GFR CBC with differential I will continue his levofloxacin prescription  Fear if we stop his levofloxacin he will experience recurrence.  If that is the case I think he will ultimately need to two-stage surgery if that is medically appropriate for him.    QT prolongation at risk of this in context of his amiodarone and fluoroquinolone: QT has been stable.  Chronic kidney disease will continue to monitor and rechecking BMP today.   Prosthetic joint infection  I will sed rate CRP BMP with GFR CBC differential  I am continue his levofloxacin prescription   QT prolongation and risk of this with fluoroquinolone and amiodarone noted but QT has been stable  Chronic kidney disease following up check BMP

## 2022-11-10 NOTE — Telephone Encounter (Signed)
RECEIVED PT PAGES FOR JARDIANCE (BICARES) AND FAXED PROVIDER PAGES TO OFFICE OF KOMAL MOTWANI.   PLEASE BE ADVISED

## 2022-11-11 NOTE — Telephone Encounter (Signed)
Were the provider portion of the PAP form ever received and completed? Thanks in advance.

## 2022-11-14 ENCOUNTER — Ambulatory Visit: Payer: PPO | Admitting: Podiatry

## 2022-11-14 ENCOUNTER — Encounter: Payer: Self-pay | Admitting: Podiatry

## 2022-11-14 DIAGNOSIS — B351 Tinea unguium: Secondary | ICD-10-CM | POA: Diagnosis not present

## 2022-11-14 DIAGNOSIS — M79675 Pain in left toe(s): Secondary | ICD-10-CM | POA: Diagnosis not present

## 2022-11-14 DIAGNOSIS — L03032 Cellulitis of left toe: Secondary | ICD-10-CM | POA: Diagnosis not present

## 2022-11-14 DIAGNOSIS — M79674 Pain in right toe(s): Secondary | ICD-10-CM

## 2022-11-14 NOTE — Telephone Encounter (Signed)
This is the fax machine at our workstation.  So it would be preferable if only paperwork for Dr. Tamera Punt or myself come to this machine.    Thanks!  CM

## 2022-11-14 NOTE — Progress Notes (Signed)
Subjective:   Patient ID: [name], male   DOB: 77 y.o.   MRN: [id number]   HPI Patient presents with several problems with 1 being crusted irritation with slight drainage still noted left big toe lateral border and also has thickened nailbeds 1-5 both feet that we try to work on and they started to give him problems again   ROS      Objective:  Physical Exam  Neurovascular status intact muscle strength adequate no proximal edema edema drainage noted crusted tissue around the left hallux lateral side localized elongated nailbeds 1-5 both feet     Assessment:  Ingrown toenail deformity left hallux that has low-grade localized paronychia improving but still present along with mycotic nail infection 1-5 both feet that is bothersome for him     Plan:  H&P reviewed both conditions.  For paronychia continue soaks and padding applied today I did do courtesy trimming of his nails and I recommended no treatment for the thickened fungal infection but did discuss if it gets worse we could consider something oral or laser

## 2022-11-16 ENCOUNTER — Other Ambulatory Visit: Payer: PPO

## 2022-11-16 DIAGNOSIS — H43391 Other vitreous opacities, right eye: Secondary | ICD-10-CM | POA: Diagnosis not present

## 2022-11-16 DIAGNOSIS — H4321 Crystalline deposits in vitreous body, right eye: Secondary | ICD-10-CM | POA: Diagnosis not present

## 2022-11-17 NOTE — Telephone Encounter (Signed)
PAP: Application for COLCRYS has been submitted to PAP Companies: TAKEDA, via fax  PAP: Application for Jardiance has been submitted to PAP Companies: BICARES, via fax   PAP: Application for JANUVIA has been submitted to PAP Companies: [employer], via Music therapist BE ADVISED JANUVIA APP IS SNAIL MAIL HAS TO BE MAILED TO COMPANY MAY TAKE A LITTLE LONGER TO GET RESPONSES.

## 2022-11-22 DIAGNOSIS — H35373 Puckering of macula, bilateral: Secondary | ICD-10-CM | POA: Diagnosis not present

## 2022-11-22 DIAGNOSIS — H4323 Crystalline deposits in vitreous body, bilateral: Secondary | ICD-10-CM | POA: Diagnosis not present

## 2022-11-22 DIAGNOSIS — H26493 Other secondary cataract, bilateral: Secondary | ICD-10-CM | POA: Diagnosis not present

## 2022-11-23 ENCOUNTER — Ambulatory Visit: Payer: PPO | Admitting: "Endocrinology

## 2022-11-24 ENCOUNTER — Encounter: Payer: Self-pay | Admitting: "Endocrinology

## 2022-11-24 ENCOUNTER — Ambulatory Visit (INDEPENDENT_AMBULATORY_CARE_PROVIDER_SITE_OTHER): Payer: PPO | Admitting: "Endocrinology

## 2022-11-24 VITALS — BP 140/60 | HR 59 | Ht 72.0 in | Wt 218.2 lb

## 2022-11-24 DIAGNOSIS — E7800 Pure hypercholesterolemia, unspecified: Secondary | ICD-10-CM

## 2022-11-24 DIAGNOSIS — Z7984 Long term (current) use of oral hypoglycemic drugs: Secondary | ICD-10-CM

## 2022-11-24 DIAGNOSIS — E1140 Type 2 diabetes mellitus with diabetic neuropathy, unspecified: Secondary | ICD-10-CM | POA: Diagnosis not present

## 2022-11-24 DIAGNOSIS — M1711 Unilateral primary osteoarthritis, right knee: Secondary | ICD-10-CM | POA: Diagnosis not present

## 2022-11-24 DIAGNOSIS — E038 Other specified hypothyroidism: Secondary | ICD-10-CM | POA: Diagnosis not present

## 2022-11-24 LAB — POCT GLYCOSYLATED HEMOGLOBIN (HGB A1C): Hemoglobin A1C: 6.5 % — AB (ref 4.0–5.6)

## 2022-11-24 MED ORDER — LEVOTHYROXINE SODIUM 75 MCG PO TABS
75.0000 ug | ORAL_TABLET | Freq: Every day | ORAL | 0 refills | Status: DC
Start: 2022-11-24 — End: 2023-02-22

## 2022-11-24 MED ORDER — EMPAGLIFLOZIN 25 MG PO TABS: 25.0000 mg | ORAL_TABLET | Freq: Every day | ORAL | 1 refills | Status: DC

## 2022-11-24 NOTE — Patient Instructions (Signed)

## 2022-11-24 NOTE — Progress Notes (Signed)
Outpatient Endocrinology Note Aaron Des Lacs, MD  11/24/22   [name] [id number]  Referring Provider: Everrett Coombe, DO Primary Care Provider: Everrett Coombe, DO Reason for consultation: Subjective   Assessment & Plan  Diagnoses and all orders for this visit:  Type 2 diabetes mellitus with diabetic neuropathy, without long-term current use of insulin (HCC) -     POCT glycosylated hemoglobin (Hb A1C) -     empagliflozin (JARDIANCE) 25 MG TABS tablet; Take 1 tablet (25 mg total) by mouth daily before breakfast.  Long term (current) use of oral hypoglycemic drugs  Pure hypercholesterolemia  Subclinical hypothyroidism -     levothyroxine (SYNTHROID) 75 MCG tablet; Take 1 tablet (75 mcg total) by mouth daily.    Diabetes complicated by neuropathy, nephropathy, CAD Hba1c goal less than 7.0, current Hba1c is 6.5% Will recommend for the following change of medications to: Start Jardiance 25 mg every day, stop januvia 50 mg every day   No known contraindications/side effects to any of above medications No UTI  Hyperlipidemia -Last LDL at goal: 53 -on rosuvastatin 40 mg QD -Follow low fat diet and exercise   -Blood pressure goal <140/90 - Microalbumin/creatinine goal < 30 -Nor on ACE/ARB, due seeing nephrology -diet changes including salt restriction -limit eating outside -counseled BP targets per standards of diabetes care -Uncontrolled blood pressure can lead to retinopathy, nephropathy and cardiovascular and atherosclerotic heart disease  -Newly diagnosed subclinical hypothyroidism  TSH persistently 9-12, with normal Total T4 Start levothyroxine 75 mcg qam   Reviewed and counseled on: -A1C target -Blood sugar targets -Complications of uncontrolled diabetes  -Checking blood sugar before meals and bedtime and bring log next visit -All medications with mechanism of action and side effects -Hypoglycemia management: rule of 15's, Glucagon  Emergency Kit and medical alert ID -low-carb low-fat plate-method diet -At least 20 minutes of physical activity per day -Annual dilated retinal eye exam and foot exam -compliance and follow up needs -follow up as scheduled or earlier if problem gets worse  Call if blood sugar is less than 70 or consistently above 250    Take a 15 gm snack of carbohydrate at bedtime before you go to sleep if your blood sugar is less than 100.    If you are going to fast after midnight for a test or procedure, ask your physician for instructions on how to reduce/decrease your insulin dose.    Call if blood sugar is less than 70 or consistently above 250  -Treating a low sugar by rule of 15  (15 gms of sugar every 15 min until sugar is more than 70) If you feel your sugar is low, test your sugar to be sure If your sugar is low (less than 70), then take 15 grams of a fast acting Carbohydrate (3-4 glucose tablets or glucose gel or 4 ounces of juice or regular soda) Recheck your sugar 15 min after treating low to make sure it is more than 70 If sugar is still less than 70, treat again with 15 grams of carbohydrate          Don't drive the hour of hypoglycemia  If unconscious/unable to eat or drink by mouth, use glucagon injection or nasal spray baqsimi and call 911. Can repeat again in 15 min if still unconscious.  Return in about 3 months (around 02/24/2023) for visit and 8 am labs before next visit.   I have reviewed current medications, nurse's notes, allergies, vital signs, past  medical and surgical history, family medical history, and social history for this encounter. Counseled patient on symptoms, examination findings, lab findings, imaging results, treatment decisions and monitoring and prognosis. The patient understood the recommendations and agrees with the treatment plan. All questions regarding treatment plan were fully answered.  Aaron Yaphank, MD  11/24/22    History of Present  Illness [name] is a 77 y.o. year old male who presents for evaluation of Type 2 diabetes mellitus.  [name] was first diagnosed in 2001.   Diabetes education +  Currently taking, Januvia 50 mg every day  Previous history: Non-insulin hypoglycemic drugs: Januvia 50 mg daily (gets samples for the year by another provider),  Took Prandin 0.5 mg qam, jardiance 10 mg every day without issues  He was previously on metformin which was stopped because of renal dysfunction Amaryl was added in 2013 starting at 4 mg daily Januvia was started in 2018  Side effects from medications: None  COMPLICATIONS +  MI , - Stroke -  retinopathy, last eye exam 2023 +  neuropathy +  nephropathy  BLOOD SUGAR DATA  CGM interpretation: At today's visit, we reviewed her CGM downloads. The full report is scanned in the media. Reviewing the CGM trends, BG are well controlled throughout the day.  Physical Exam  BP (!) 140/60   Pulse (!) 59   Ht 6' (1.829 m)   Wt 218 lb 3.2 oz (99 kg)   SpO2 96%   BMI 29.59 kg/m    Constitutional: well developed, well nourished Head: normocephalic, atraumatic Eyes: sclera anicteric, no redness Neck: supple Lungs: normal respiratory effort Neurology: alert and oriented Skin: dry, no appreciable rashes Musculoskeletal: no appreciable defects Psychiatric: normal mood and affect Diabetic Foot Exam - Simple   No data filed      Current Medications Patient's Medications  New Prescriptions   EMPAGLIFLOZIN (JARDIANCE) 25 MG TABS TABLET    Take 1 tablet (25 mg total) by mouth daily before breakfast.  Previous Medications   ALBUTEROL (VENTOLIN HFA) 108 (90 BASE) MCG/ACT INHALER    Inhale 2 puffs into the lungs every 6 (six) hours as needed for wheezing or shortness of breath.   ALLOPURINOL (ZYLOPRIM) 100 MG TABLET    TAKE 1/2 TABLET BY MOUTH DAILY   AMBULATORY NON FORMULARY MEDICATION    Please provide rollator walker with hand brakes and seat.  Dx:  R26.81 Gait instability   AMBULATORY NON FORMULARY MEDICATION    Please provide shower bench.  R26.81 Gait instability   AMIODARONE (PACERONE) 200 MG TABLET    Take 200 mg by mouth daily. [name]   AMLODIPINE (NORVASC) 10 MG TABLET    TAKE 1 TABLET BY MOUTH EVERY DAY   ASPIRIN EC 81 MG TABLET    Take 81 mg by mouth daily. Swallow whole.   ATORVASTATIN (LIPITOR) 40 MG TABLET    Take 40 mg by mouth daily.   COLCHICINE 0.6 MG TABLET    TAKE 1 TABLET BY MOUTH EVERY DAY   CONTINUOUS GLUCOSE SENSOR (FREESTYLE LIBRE 3 SENSOR) MISC    USE 1 AS DIRECTED EVERY 14 DAYS TO CHECK GLUCOSE CONTINUOUSLY   DOCUSATE SODIUM (COLACE) 100 MG CAPSULE    Take 100 mg by mouth daily as needed for mild constipation.   FERROUS SULFATE 325 (65 FE) MG TABLET    Take 325 mg by mouth daily with breakfast.   FUROSEMIDE (LASIX) 20 MG TABLET    Take 1 tablet (20 mg  total) by mouth daily.   HYDRALAZINE (APRESOLINE) 100 MG TABLET    Take 1 tablet (100 mg total) by mouth 3 (three) times daily.   LEVOFLOXACIN (LEVAQUIN) 500 MG TABLET    Take 1 tablet by mouth daily.   METOPROLOL SUCCINATE (TOPROL-XL) 50 MG 24 HR TABLET    Take 50 mg by mouth daily.   OMEPRAZOLE (PRILOSEC) 40 MG CAPSULE    TAKE 1 CAPSULE BY MOUTH EVERY DAY IN THE MORNING AND AT BEDTIME   SILVER SULFADIAZINE (SILVADENE) 1 % CREAM    Apply 1 Application topically daily.   SPIRONOLACTONE (ALDACTONE) 25 MG TABLET    Take 12.5 mg by mouth daily.  Modified Medications   Modified Medication Previous Medication   LEVOTHYROXINE (SYNTHROID) 75 MCG TABLET levothyroxine (SYNTHROID) 75 MCG tablet      Take 1 tablet (75 mcg total) by mouth daily.    Take 1 tablet (75 mcg total) by mouth daily.  Discontinued Medications   EMPAGLIFLOZIN (JARDIANCE) 10 MG TABS TABLET    Take 10 mg by mouth daily.    Allergies Allergies  Allergen Reactions   Shellfish Allergy Rash    Past Medical History Past Medical History:  Diagnosis Date   Arthritis    Asthma    no inhaler    Atrial fibrillation (HCC) 10/16/2020   CKD (chronic kidney disease) 04/13/2020   Coronary artery disease    cardiologist-  dr spruill; last visit 3 mos ago per pt   Enterobacter sepsis (HCC) 04/13/2020   GERD (gastroesophageal reflux disease)    History of MI (myocardial infarction)    1985   Hydronephrosis, left    Hypertension    Myocardial infarction (HCC)    Nephrolithiasis    left   Neuromuscular disorder (HCC)    TINGLING IN BOTH HANDS   Presence of tooth-root and mandibular implants    lower dental implants   Prostate cancer (HCC)    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   QT prolongation 10/16/2020   Shortness of breath    WITH EXERTION   Thigh abscess 04/13/2020   Type 2 diabetes mellitus (HCC)    Vaccine counseling 04/28/2022   Zoster 09/13/2021    Past Surgical History Past Surgical History:  Procedure Laterality Date   BUNIONECTOMY     CYSTOSCOPY W/ RETROGRADES Left 03/14/2014   Procedure: CYSTOSCOPY WITH LEFT RETROGRADE PYELOGRAM, Left Ureteroscopy, [place] ureteral Stent No string;  Surgeon: Danae Chen, MD;  Location: [facility];  Service: Urology;  Laterality: Left;   CYSTOSCOPY W/ URETERAL STENT PLACEMENT Bilateral 01/15/2020   Procedure: CYSTOSCOPY WITH RETROGRADE PYELOGRAM/URETERAL STENT PLACEMENT;  Surgeon: Sebastian Ache, MD;  Location: [facility];  Service: Urology;  Laterality: Bilateral;   CYSTOSCOPY/URETEROSCOPY/HOLMIUM LASER/STENT PLACEMENT Bilateral 03/17/2020   Procedure: CYSTOSCOPY/URETEROSCOPY/HOLMIUM LASER/STENT LEFT PLACEMENT;  Surgeon: Jerilee Field, MD;  Location: [facility];  Service: Urology;  Laterality: Bilateral;   HERNIA REPAIR     INCISION AND DRAINAGE Left 03/09/2020   Procedure: INCISION AND DRAINAGE LEFT HIP WITH LINER EXCHANGE;  Surgeon: Ollen Gross, MD;  Location: [facility];  Service: Orthopedics;  Laterality: Left;   IR FLUORO GUIDE CV LINE RIGHT  03/10/2020   IR RADIOLOGIST EVAL & MGMT  04/01/2020   IR RADIOLOGIST  EVAL & MGMT  04/15/2020   IR RADIOLOGIST EVAL & MGMT  04/30/2020   IR REMOVAL TUN CV CATH W/O FL  04/22/2020   IR US GUIDE BX ASP/DRAIN  03/17/2020   IR US GUIDE VASC ACCESS RIGHT  03/10/2020   PROSTATE BIOPSY     TOTAL HIP ARTHROPLASTY Left 03-20-2009   TOTAL HIP ARTHROPLASTY  04/09/2012   Procedure: TOTAL HIP ARTHROPLASTY;  Surgeon: Loanne Drilling, MD;  Location: [facility];  Service: Orthopedics;  Laterality: Right;   TOTAL KNEE ARTHROPLASTY Left 05-16-2008    Family History family history includes Cancer in his father, maternal uncle, and mother; Diabetes in his father; Multiple sclerosis in his daughter.  Social History Social History   Socioeconomic History   Marital status: Widowed    Spouse name: Not on file   Number of children: 5   Years of education: 9   Highest education level: 9th grade  Occupational History   Occupation: Retired  Tobacco Use   Smoking status: Former    Current packs/day: 0.00    Average packs/day: 1 pack/day for 25.0 years (25.0 ttl pk-yrs)    Types: Cigarettes    Start date: 04/12/1959    Quit date: 04/11/1984    Years since quitting: 38.6   Smokeless tobacco: Former    Quit date: 04/02/1985  Vaping Use   Vaping status: Never Used  Substance and Sexual Activity   Alcohol use: Not Currently    Comment: RARE   Drug use: No   Sexual activity: Not Currently  Other Topics Concern   Not on file  Social History Narrative   Lives alone. He had five children and one has deceased. He enjoys watching television.   Social Determinants of Health   Financial Resource Strain: Medium Risk (09/29/2022)   Overall Financial Resource Strain (CARDIA)    Difficulty of Paying Living Expenses: Somewhat hard  Food Insecurity: No Food Insecurity (09/27/2022)   Hunger Vital Sign    Worried About Running Out of Food in the Last Year: Never true    Ran Out of Food in the Last Year: Never true  Transportation Needs: No Transportation Needs (09/27/2022)   PRAPARE -  Administrator, Civil Service (Medical): No    Lack of Transportation (Non-Medical): No  Physical Activity: Sufficiently Active (09/27/2022)   Exercise Vital Sign    Days of Exercise per Week: 3 days    Minutes of Exercise per Session: 60 min  Stress: No Stress Concern Present (09/27/2022)   [employer] of Occupational Health - Occupational Stress Questionnaire    Feeling of Stress : Not at all  Social Connections: Moderately Isolated (09/27/2022)   Social Connection and Isolation Panel [NHANES]    Frequency of Communication with Friends and Family: More than three times a week    Frequency of Social Gatherings with Friends and Family: More than three times a week    Attends Religious Services: More than 4 times per year    Active Member of [employer] or Organizations: No    Attends Banker Meetings: Never    Marital Status: Widowed  Intimate Partner Violence: Not At Risk (09/27/2022)   Humiliation, Afraid, Rape, and Kick questionnaire    Fear of Current or Ex-Partner: No    Emotionally Abused: No    Physically Abused: No    Sexually Abused: No    Lab Results  Component Value Date   HGBA1C 6.5 (A) 11/24/2022   Lab Results  Component Value Date   CHOL 121 12/10/2021   Lab Results  Component Value Date   HDL 37.40 (L) 12/10/2021   Lab Results  Component Value Date   LDLCALC 54 12/10/2021   Lab Results  Component Value Date  TRIG 146.0 12/10/2021   Lab Results  Component Value Date   CHOLHDL 3 12/10/2021   Lab Results  Component Value Date   CREATININE 1.92 (H) 11/08/2022   Lab Results  Component Value Date   GFR 40.72 (L) 12/10/2021   Lab Results  Component Value Date   MICROALBUR 15.0 (H) 12/10/2021      Component Value Date/Time   NA 137 11/08/2022 0846   NA 140 08/17/2022 0847   K 4.5 11/08/2022 0846   CL 108 11/08/2022 0846   CO2 21 11/08/2022 0846   GLUCOSE 134 (H) 11/08/2022 0846   BUN 28 (H) 11/08/2022 0846   BUN 39 (H)  08/17/2022 0847   CREATININE 1.92 (H) 11/08/2022 0846   CALCIUM 9.2 11/08/2022 0846   PROT 7.0 05/24/2022 0708   ALBUMIN 4.1 05/24/2022 0708   AST 19 05/24/2022 0708   ALT 20 05/24/2022 0708   ALKPHOS 65 05/24/2022 0708   BILITOT 0.2 05/24/2022 0708   GFRNONAA 45 (L) 05/08/2021 0420   GFRNONAA 58 (L) 10/16/2020 0916   GFRAA 67 10/16/2020 0916      Latest Ref Rng & Units 11/08/2022    8:46 AM 08/17/2022    8:47 AM 08/08/2022    8:35 AM  BMP  Glucose 65 - 99 mg/dL 161  86  096   BUN 7 - 25 mg/dL 28  39  35   Creatinine 0.70 - 1.28 mg/dL 0.45  4.09  8.11   BUN/Creat Ratio 6 - 22 (calc) 15  20  17    Sodium 135 - 146 mmol/L 137  140  140   Potassium 3.5 - 5.3 mmol/L 4.5  4.2  5.3   Chloride 98 - 110 mmol/L 108  108  108   CO2 20 - 32 mmol/L 21  20  16    Calcium 8.6 - 10.3 mg/dL 9.2  8.6  8.9        Component Value Date/Time   WBC 4.1 11/08/2022 0846   RBC 3.12 (L) 11/08/2022 0846   HGB 10.0 (L) 11/08/2022 0846   HCT 29.5 (L) 11/08/2022 0846   PLT 296 11/08/2022 0846   MCV 94.6 11/08/2022 0846   MCH 32.1 11/08/2022 0846   MCHC 33.9 11/08/2022 0846   RDW 13.1 11/08/2022 0846   LYMPHSABS 1,111 11/08/2022 0846   MONOABS 0.6 08/01/2020 0837   EOSABS 90 11/08/2022 0846   BASOSABS 8 11/08/2022 0846     Parts of this note may have been dictated using voice recognition software. There may be variances in spelling and vocabulary which are unintentional. Not all errors are proofread. Please notify the Thereasa Parkin if any discrepancies are noted or if the meaning of any statement is not clear.

## 2022-11-25 ENCOUNTER — Encounter: Payer: Self-pay | Admitting: "Endocrinology

## 2022-12-02 NOTE — Telephone Encounter (Signed)
PAP: Patient assistance application for [place] has been approved by PAP Companies: [employer] from 11/21/2022 to 12/312024. Medication should be delivered to PAP Delivery: Home For further shipping updates, please contact [employer] at [phone] Pt ID is: NO ID  PLEASE BE ADVISED  IT WAS SENT OUT ON 11/22/2022 AND RECEIVED ON 11/25/2022.

## 2022-12-14 DIAGNOSIS — H35373 Puckering of macula, bilateral: Secondary | ICD-10-CM | POA: Diagnosis not present

## 2022-12-20 DIAGNOSIS — H35372 Puckering of macula, left eye: Secondary | ICD-10-CM | POA: Diagnosis not present

## 2022-12-20 DIAGNOSIS — H4323 Crystalline deposits in vitreous body, bilateral: Secondary | ICD-10-CM | POA: Diagnosis not present

## 2022-12-20 DIAGNOSIS — H35371 Puckering of macula, right eye: Secondary | ICD-10-CM | POA: Diagnosis not present

## 2022-12-20 DIAGNOSIS — H26491 Other secondary cataract, right eye: Secondary | ICD-10-CM | POA: Diagnosis not present

## 2022-12-22 NOTE — Telephone Encounter (Signed)
PAP: Patient assistance application for COLCRYS has been approved by PAP Companies: [employer] from 11/22/2022 to 04/11/2023. Medication should be delivered to PAP Delivery: Home For further shipping updates, please contact [employer] [phone] Pt ID is: NO ID  PLEASE BE ADVISED

## 2022-12-23 ENCOUNTER — Other Ambulatory Visit: Payer: Self-pay | Admitting: Family Medicine

## 2022-12-23 DIAGNOSIS — I129 Hypertensive chronic kidney disease with stage 1 through stage 4 chronic kidney disease, or unspecified chronic kidney disease: Secondary | ICD-10-CM | POA: Diagnosis not present

## 2022-12-23 DIAGNOSIS — N1832 Chronic kidney disease, stage 3b: Secondary | ICD-10-CM | POA: Diagnosis not present

## 2022-12-23 DIAGNOSIS — N200 Calculus of kidney: Secondary | ICD-10-CM | POA: Diagnosis not present

## 2022-12-23 DIAGNOSIS — E1122 Type 2 diabetes mellitus with diabetic chronic kidney disease: Secondary | ICD-10-CM | POA: Diagnosis not present

## 2022-12-23 DIAGNOSIS — N1330 Unspecified hydronephrosis: Secondary | ICD-10-CM | POA: Diagnosis not present

## 2022-12-28 DIAGNOSIS — H35372 Puckering of macula, left eye: Secondary | ICD-10-CM | POA: Diagnosis not present

## 2023-01-03 DIAGNOSIS — H26491 Other secondary cataract, right eye: Secondary | ICD-10-CM | POA: Diagnosis not present

## 2023-01-03 DIAGNOSIS — H35373 Puckering of macula, bilateral: Secondary | ICD-10-CM | POA: Diagnosis not present

## 2023-01-03 DIAGNOSIS — H4323 Crystalline deposits in vitreous body, bilateral: Secondary | ICD-10-CM | POA: Diagnosis not present

## 2023-01-08 ENCOUNTER — Other Ambulatory Visit (HOSPITAL_BASED_OUTPATIENT_CLINIC_OR_DEPARTMENT_OTHER): Payer: Self-pay | Admitting: Family

## 2023-01-08 DIAGNOSIS — I10 Essential (primary) hypertension: Secondary | ICD-10-CM

## 2023-01-09 DIAGNOSIS — H33022 Retinal detachment with multiple breaks, left eye: Secondary | ICD-10-CM | POA: Diagnosis not present

## 2023-01-09 DIAGNOSIS — H35373 Puckering of macula, bilateral: Secondary | ICD-10-CM | POA: Diagnosis not present

## 2023-01-10 ENCOUNTER — Telehealth: Payer: Self-pay

## 2023-01-10 NOTE — Progress Notes (Signed)
01/10/2023  Patient ID: [name], male   DOB: 06/21/1945, 77 y.o.   MRN: [id number]  Outreach attempt to check on medication management of chronic conditions and see if patient has successfully received Jardiance and Colcrys through PAP's he has been enrolled in.  I was not able to reach the patient, but a HIPAA compliant voicemail with my direct phone number was left.  Lenna Gilford, PharmD, DPLA

## 2023-01-11 DIAGNOSIS — H33012 Retinal detachment with single break, left eye: Secondary | ICD-10-CM | POA: Diagnosis not present

## 2023-01-11 DIAGNOSIS — H33022 Retinal detachment with multiple breaks, left eye: Secondary | ICD-10-CM | POA: Diagnosis not present

## 2023-01-16 ENCOUNTER — Ambulatory Visit (HOSPITAL_BASED_OUTPATIENT_CLINIC_OR_DEPARTMENT_OTHER): Payer: PPO | Admitting: Family

## 2023-01-16 DIAGNOSIS — H35371 Puckering of macula, right eye: Secondary | ICD-10-CM | POA: Diagnosis not present

## 2023-01-16 DIAGNOSIS — H35372 Puckering of macula, left eye: Secondary | ICD-10-CM | POA: Diagnosis not present

## 2023-01-16 DIAGNOSIS — H26491 Other secondary cataract, right eye: Secondary | ICD-10-CM | POA: Diagnosis not present

## 2023-01-16 DIAGNOSIS — H4323 Crystalline deposits in vitreous body, bilateral: Secondary | ICD-10-CM | POA: Diagnosis not present

## 2023-01-16 DIAGNOSIS — H33022 Retinal detachment with multiple breaks, left eye: Secondary | ICD-10-CM | POA: Diagnosis not present

## 2023-01-17 ENCOUNTER — Encounter (HOSPITAL_BASED_OUTPATIENT_CLINIC_OR_DEPARTMENT_OTHER): Payer: Self-pay | Admitting: Family

## 2023-01-17 ENCOUNTER — Ambulatory Visit (HOSPITAL_BASED_OUTPATIENT_CLINIC_OR_DEPARTMENT_OTHER): Payer: PPO | Admitting: Family

## 2023-01-17 VITALS — BP 128/60 | HR 59 | Ht 72.0 in | Wt 216.3 lb

## 2023-01-17 DIAGNOSIS — I10 Essential (primary) hypertension: Secondary | ICD-10-CM

## 2023-01-17 DIAGNOSIS — I25118 Atherosclerotic heart disease of native coronary artery with other forms of angina pectoris: Secondary | ICD-10-CM | POA: Diagnosis not present

## 2023-01-17 DIAGNOSIS — E785 Hyperlipidemia, unspecified: Secondary | ICD-10-CM | POA: Diagnosis not present

## 2023-01-17 MED ORDER — FUROSEMIDE 20 MG PO TABS: 20.0000 mg | ORAL_TABLET | Freq: Every day | ORAL | 3 refills | Status: DC

## 2023-01-17 MED ORDER — SPIRONOLACTONE 25 MG PO TABS: 12.5000 mg | ORAL_TABLET | Freq: Every day | ORAL | 3 refills | Status: DC

## 2023-01-17 MED ORDER — HYDRALAZINE HCL 100 MG PO TABS: 100.0000 mg | ORAL_TABLET | Freq: Three times a day (TID) | ORAL | 3 refills | Status: DC

## 2023-01-17 NOTE — Patient Instructions (Signed)
Medication Instructions:  Your physician recommends that you continue on your current medications as directed. Please refer to the Current Medication list given to you today.  We have sent an updated Prescription to [facility] on [address]   Follow-Up: 1 year with [facility] with Dr. Duke Salvia or Gillian Shields, NP

## 2023-01-17 NOTE — Progress Notes (Unsigned)
Advanced Hypertension Clinic Initial Assessment:    Date:  01/17/2023   ID:  [name], DOB 1946/02/07, MRN [id number]  PCP:  Everrett Coombe, DO  Cardiologist:  None  Nephrologist:  Referring MD: Everrett Coombe, DO   CC: Hypertension  History of Present Illness:    [name] is a 77 y.o. male with a hx of hypertension, chronic infection of right hip (managed by ID), DM2, CAD, osteoarthritis, prostate cancer here to follow up in the Advanced Hypertension Clinic. He was referred by his primary care provider, Dr. Ashley Royalty, due to elevated blood pressure despite HCTZ, Spironolactone, Alodipine, Hydralazine.   Initial ADV HTN clinic visit 05/19/22. Follows with [name] for general cardiology. [name] was diagnosed with hypertension in the 1990s at the time of his MI. Blood pressure checked with arm cuff at home mostly 140s/60s. he reports tobacco use remotely having quit 1986. Alcohol use rarely. For exercise he goes to [facility] and does predominantly upper body exercises. he eats at home and outside of the home and does not follow low sodium diet. Drinks water and Spite during the day.   Notes his atrial fibrillation when he strains but overall not bothersome. Unclear why he is not on [facility], notes from primary cardiologist received but atrial fibrillation not clearly addressed.   At initial visit Hydralazine increased to 25mg  TID. Amlodipine, Spironolactone, Toprol continued. Renal duplex 06/10/22 with no stenosis. Metanephrines, catecholamines, cortisol were normal. Mildly elevated TSH but T3/T4 normal with plans to repeat in 3 months. Potassium mildly elevated and spironolactone reduced to 12.5mg  QD.   Last seen 06/30/22 at which time hydralazine was increased to 50 mg 3 times daily.  He did have a cyst to his left elbow and was recommended to follow-up with primary care provider regarding this. He was referred to nephrology due to persistent hyperkalemia requiring  Lokelma. Appointment not yet scheduled.   Presents today for follow up with family member. Feeling overall well. Elbow cyst spontaneously resolved without intervention. His blood pressure has been persistently difficult to control.  BP log reviewed with range 129/49-155/60. Average SBP 143. Notes for some reason the pharmacy provided him and 25 mg tablets of hydralazine last fill and has been taking 2 of them twice per day to make 50 mg twice daily dosing.   Reports no shortness of breath nor dyspnea on exertion. Reports no chest pain, pressure, or tightness. No edema, orthopnea, PND. Reports no palpitations.    At last visit Hydralazine increased to 75mg  TID. Via subsequent phone call, it was increased to 100mg  TID.   142/57  137/55-152/64  230 lbs ? 216 lbs     Previous antihypertensives: Losartan - Potassium elevated  Past Medical History:  Diagnosis Date   Arthritis    Asthma    no inhaler   Atrial fibrillation (HCC) 10/16/2020   CKD (chronic kidney disease) 04/13/2020   Coronary artery disease    cardiologist-  dr spruill; last visit 3 mos ago per pt   Enterobacter sepsis (HCC) 04/13/2020   GERD (gastroesophageal reflux disease)    History of MI (myocardial infarction)    1985   Hydronephrosis, left    Hypertension    Myocardial infarction (HCC)    Nephrolithiasis    left   Neuromuscular disorder (HCC)    TINGLING IN BOTH HANDS   Presence of tooth-root and mandibular implants    lower dental implants   Prostate cancer (HCC)    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   QT prolongation 10/16/2020   Shortness of breath    WITH EXERTION   Thigh abscess 04/13/2020   Type 2 diabetes mellitus (HCC)    Vaccine counseling 04/28/2022   Zoster 09/13/2021    Past Surgical History:  Procedure Laterality Date   BUNIONECTOMY     CYSTOSCOPY W/ RETROGRADES Left 03/14/2014   Procedure: CYSTOSCOPY WITH LEFT RETROGRADE PYELOGRAM, Left Ureteroscopy, Lweft ureteral Stent No  string;  Surgeon: Danae Chen, MD;  Location: [facility] ;  Service: Urology;  Laterality: Left;   CYSTOSCOPY W/ URETERAL STENT PLACEMENT Bilateral 01/15/2020   Procedure: CYSTOSCOPY WITH RETROGRADE PYELOGRAM/URETERAL STENT PLACEMENT;  Surgeon: Sebastian Ache, MD;  Location: [facility];  Service: Urology;  Laterality: Bilateral;   CYSTOSCOPY/URETEROSCOPY/HOLMIUM LASER/STENT PLACEMENT Bilateral 03/17/2020   Procedure: CYSTOSCOPY/URETEROSCOPY/HOLMIUM LASER/STENT LEFT PLACEMENT;  Surgeon: Jerilee Field, MD;  Location: [facility];  Service: Urology;  Laterality: Bilateral;   HERNIA REPAIR     INCISION AND DRAINAGE Left 03/09/2020   Procedure: INCISION AND DRAINAGE LEFT HIP WITH LINER EXCHANGE;  Surgeon: Ollen Gross, MD;  Location: [facility];  Service: Orthopedics;  Laterality: Left;   IR FLUORO GUIDE CV LINE RIGHT  03/10/2020   IR RADIOLOGIST EVAL & MGMT  04/01/2020   IR RADIOLOGIST EVAL & MGMT  04/15/2020   IR RADIOLOGIST EVAL & MGMT  04/30/2020   IR REMOVAL TUN CV CATH W/O FL  04/22/2020   IR US GUIDE BX ASP/DRAIN  03/17/2020   IR US GUIDE VASC ACCESS RIGHT  03/10/2020   PROSTATE BIOPSY     TOTAL HIP ARTHROPLASTY Left 03-20-2009   TOTAL HIP ARTHROPLASTY  04/09/2012   Procedure: TOTAL HIP ARTHROPLASTY;  Surgeon: Loanne Drilling, MD;  Location: [facility];  Service: Orthopedics;  Laterality: Right;   TOTAL KNEE ARTHROPLASTY Left 05-16-2008    Current Medications: Current Meds  Medication Sig   albuterol (VENTOLIN HFA) 108 (90 Base) MCG/ACT inhaler Inhale 2 puffs into the lungs every 6 (six) hours as needed for wheezing or shortness of breath.   allopurinol (ZYLOPRIM) 100 MG tablet TAKE 1/2 TABLET BY MOUTH DAILY   AMBULATORY NON FORMULARY MEDICATION Please provide rollator Taydon Nasworthy with hand brakes and seat.  Dx: R26.81 Gait instability   AMBULATORY NON FORMULARY MEDICATION Please provide shower bench.  R26.81 Gait instability   amiodarone (PACERONE) 200 MG tablet Take 200 mg by mouth daily.  [name]   amLODipine (NORVASC) 10 MG tablet TAKE 1 TABLET BY MOUTH EVERY DAY   aspirin EC 81 MG tablet Take 81 mg by mouth daily. Swallow whole.   colchicine 0.6 MG tablet TAKE 1 TABLET BY MOUTH EVERY DAY   Continuous Glucose Sensor (FREESTYLE LIBRE 3 SENSOR) MISC USE 1 AS DIRECTED EVERY 14 DAYS TO CHECK GLUCOSE CONTINUOUSLY   docusate sodium (COLACE) 100 MG capsule Take 100 mg by mouth daily as needed for mild constipation.   empagliflozin (JARDIANCE) 25 MG TABS tablet Take 1 tablet (25 mg total) by mouth daily before breakfast.   ferrous sulfate 325 (65 FE) MG tablet Take 325 mg by mouth daily with breakfast.   furosemide (LASIX) 20 MG tablet Take 1 tablet (20 mg total) by mouth daily.   hydrALAZINE (APRESOLINE) 100 MG tablet Take 1 tablet (100 mg total) by mouth 3 (three) times daily.   levofloxacin (LEVAQUIN) 500 MG tablet Take 1 tablet by mouth daily.   levothyroxine (SYNTHROID) 75 MCG tablet Take 1 tablet (75 mcg total) by mouth daily.   metoprolol succinate (  TOPROL-XL) 50 MG 24 hr tablet Take 50 mg by mouth daily.   omeprazole (PRILOSEC) 40 MG capsule TAKE 1 CAPSULE BY MOUTH EVERY MORNING AND AT BEDTIME   rosuvastatin (CRESTOR) 40 MG tablet Take 40 mg by mouth daily.   silver sulfADIAZINE (SILVADENE) 1 % cream Apply 1 Application topically daily.   spironolactone (ALDACTONE) 25 MG tablet TAKE 1/2 TABLET BY MOUTH EVERY DAY   [DISCONTINUED] atorvastatin (LIPITOR) 40 MG tablet Take 40 mg by mouth daily.     Allergies:   Shellfish allergy   Social History   Socioeconomic History   Marital status: Widowed    Spouse name: Not on file   Number of children: 5   Years of education: 9   Highest education level: 9th grade  Occupational History   Occupation: Retired  Tobacco Use   Smoking status: Former    Current packs/day: 0.00    Average packs/day: 1 pack/day for 25.0 years (25.0 ttl pk-yrs)    Types: Cigarettes    Start date: 04/12/1959    Quit date: 04/11/1984    Years since  quitting: 38.7   Smokeless tobacco: Former    Quit date: 04/02/1985  Vaping Use   Vaping status: Never Used  Substance and Sexual Activity   Alcohol use: Not Currently    Comment: RARE   Drug use: No   Sexual activity: Not Currently  Other Topics Concern   Not on file  Social History Narrative   Lives alone. He had five children and one has deceased. He enjoys watching television.   Social Determinants of Health   Financial Resource Strain: Medium Risk (09/29/2022)   Overall Financial Resource Strain (CARDIA)    Difficulty of Paying Living Expenses: Somewhat hard  Food Insecurity: No Food Insecurity (09/27/2022)   Hunger Vital Sign    Worried About Running Out of Food in the Last Year: Never true    Ran Out of Food in the Last Year: Never true  Transportation Needs: No Transportation Needs (09/27/2022)   PRAPARE - Administrator, Civil Service (Medical): No    Lack of Transportation (Non-Medical): No  Physical Activity: Sufficiently Active (09/27/2022)   Exercise Vital Sign    Days of Exercise per Week: 3 days    Minutes of Exercise per Session: 60 min  Stress: No Stress Concern Present (09/27/2022)   [employer] of Occupational Health - Occupational Stress Questionnaire    Feeling of Stress : Not at all  Social Connections: Moderately Isolated (09/27/2022)   Social Connection and Isolation Panel [NHANES]    Frequency of Communication with Friends and Family: More than three times a week    Frequency of Social Gatherings with Friends and Family: More than three times a week    Attends Religious Services: More than 4 times per year    Active Member of [employer] or Organizations: No    Attends Banker Meetings: Never    Marital Status: Widowed     Family History: The patient's family history includes Cancer in his father, maternal uncle, and mother; Diabetes in his father; Multiple sclerosis in his daughter.  ROS:   Please see the history of present  illness.    All other systems reviewed and are negative.  EKGs/Labs/Other Studies Reviewed:    EKG:  EKG is not ordered today.    Recent Labs: 05/24/2022: ALT 20 07/26/2022: TSH 9.420 11/08/2022: BUN 28; Creat 1.92; Hemoglobin 10.0; Platelets 296; Potassium 4.5; Sodium 137   Recent  Lipid Panel    Component Value Date/Time   CHOL 121 12/10/2021 0821   TRIG 146.0 12/10/2021 0821   HDL 37.40 (L) 12/10/2021 0821   CHOLHDL 3 12/10/2021 0821   VLDL 29.2 12/10/2021 0821   LDLCALC 54 12/10/2021 0821   LDLCALC 146 (H) 05/18/2020 0000   LDLDIRECT 132 (H) 10/24/2019 0807    Physical Exam:   VS:  BP 128/60   Pulse (!) 59   Ht 6' (1.829 m)   Wt 216 lb 4.8 oz (98.1 kg)   SpO2 97%   BMI 29.34 kg/m  , BMI Body mass index is 29.34 kg/m. GENERAL:  Well appearing, overweight HEENT: Pupils equal round and reactive, fundi not visualized, oral mucosa unremarkable NECK:  No jugular venous distention, waveform within normal limits, carotid upstroke brisk and symmetric, no bruits, no thyromegaly LYMPHATICS:  No cervical adenopathy LUNGS:  Clear to auscultation bilaterally HEART:  RRR.  PMI not displaced or sustained,S1 and S2 within normal limits, no S3, no S4, no clicks, no rubs, no murmurs ABD:  Flat, positive bowel sounds normal in frequency in pitch, no bruits, no rebound, no guarding, no midline pulsatile mass, no hepatomegaly, no splenomegaly EXT:  2 plus pulses throughout, no edema, no cyanosis no clubbing SKIN:  No rashes no nodules. NEURO:  Cranial nerves II through XII grossly intact, motor grossly intact throughout PSYCH:  Cognitively intact, oriented to person place and time   ASSESSMENT/PLAN:    HTN - BP not at goal <130/80. Increase Hydralazine to 75mg  TID.  Reiterated need to check blood pressure after medications.  Continue Amlodipine 10mg  QD, Spironolactone 12.5mg  QD, Toprol 50mg  QD.  Losartan previously discontinued due to hyperkalemia. Secondary workup thus far  unremarkable. Heart healthy diet and regular cardiovascular exercise encouraged.   Call to check in in 2 weeks.  If BP not at goal plan to increase hydralazine to 100 mg TID at that time.  Hyperkalemia - 05/24/22 K 5.4 ? 06/29/21 K 5.7 ? 07/04/22 K 5.4 ? 08/08/22 K 5.3 ? 08/17/22 K 4.2. Required multiple packets of Lokelma to correct which unfortunately his insurance does not cover.  Hyperkalemia presently resolved.  Spironolactone has been discontinued.  Has been referred to nephrology.  CKD - Careful titration of diuretic and antihypertensive. Has been referred to nephrology and awaiting appt.   Abnormal TSH / Subclinical hypothyroidism - 08/19/2022 Endocrinology started low-dose levothyroxine.  Continued monitoring by their office.  CAD - Prior MI 12. Myoview 05/2021 low risk study. Stable with no anginal symptoms. No indication for ischemic evaluation.  Follows with [name].  Continue aspirin, rosuvastatin, Jardiance, metoprolol.  PAF / On Amiodarone therapy - RRR by auscultation. Reports rare palpitations. Follows with [name]. Not on OAC, unclear why and records reviewed from [name] do not clearly state why. Will defer to [name] as he is primary general cardiologist.  HLD, LDL goal < 70 - 12/2021 LDL 54. Continue Rosuvastatin.   DM2 - Follows with endocrinology.  Appreciate inclusion of Jardiance for cardioprotective benefit.  Obesity - Weight loss via diet and exercise encouraged. Discussed the impact being overweight would have on cardiovascular risk.  Screening for Secondary Hypertension:  Click here to document screening for secondary causes of HTN  :[id number]}     05/19/2022    9:12 AM 06/30/2022    1:21 PM  Causes  Renovascular HTN Screened Screened     - Comments  Renal duplex no stenosis  Thyroid Disease Screened Screened     -  Comments  abnormal TSH but T3/T4 normal  Hyperaldosteronism Not Screened      - Comments already on Cleda Daub - could stop in future to  assess if indicated   Pheochromocytoma Screened Screened     - Comments  normal catecholamines, metanephrines  Cushing's Syndrome Screened Screened     - Comments  normal cortisol  Coarctation of the Aorta N/A      - Comments symmetrical BP   Compliance Screened     Relevant Labs/Studies:    Latest Ref Rng & Units 11/08/2022    8:46 AM 08/17/2022    8:47 AM 08/08/2022    8:35 AM  Basic Labs  Sodium 135 - 146 mmol/L 137  140  140   Potassium 3.5 - 5.3 mmol/L 4.5  4.2  5.3   Creatinine 0.70 - 1.28 mg/dL 0.86  5.78  4.69        Latest Ref Rng & Units 07/26/2022    8:28 AM 06/30/2022    9:05 AM  Thyroid   TSH 0.450 - 4.500 uIU/mL 9.420  12.200           Latest Ref Rng & Units 05/24/2022    7:08 AM  Metanephrines/Catecholamines   Epinephrine 0 - 62 pg/mL 46   Norepinephrine 0 - 874 pg/mL 1,042   Dopamine 0 - 48 pg/mL <30   Metanephrines 0.0 - 88.0 pg/mL 38.8   Normetanephrines  0.0 - 285.2 pg/mL 164.5           06/10/2022    8:45 AM  Renovascular   Renal Artery [place] Completed Yes     Disposition:    FU with MD/PharmD/APP in 4 months   Medication Adjustments/Labs and Tests Ordered: Current medicines are reviewed at length with the patient today.  Concerns regarding medicines are outlined above.  No orders of the defined types were placed in this encounter.  No orders of the defined types were placed in this encounter.    Signed, Alver Sorrow, NP  01/17/2023 8:06 AM    [facility]

## 2023-01-30 DIAGNOSIS — H26491 Other secondary cataract, right eye: Secondary | ICD-10-CM | POA: Diagnosis not present

## 2023-01-30 DIAGNOSIS — H33022 Retinal detachment with multiple breaks, left eye: Secondary | ICD-10-CM | POA: Diagnosis not present

## 2023-01-30 DIAGNOSIS — H4323 Crystalline deposits in vitreous body, bilateral: Secondary | ICD-10-CM | POA: Diagnosis not present

## 2023-01-30 DIAGNOSIS — H35373 Puckering of macula, bilateral: Secondary | ICD-10-CM | POA: Diagnosis not present

## 2023-02-03 ENCOUNTER — Encounter: Payer: Self-pay | Admitting: Family Medicine

## 2023-02-03 ENCOUNTER — Ambulatory Visit (INDEPENDENT_AMBULATORY_CARE_PROVIDER_SITE_OTHER): Payer: PPO | Admitting: Family Medicine

## 2023-02-03 VITALS — BP 143/67 | HR 60 | Temp 97.6°F | Ht 72.0 in | Wt 218.1 lb

## 2023-02-03 DIAGNOSIS — N1832 Chronic kidney disease, stage 3b: Secondary | ICD-10-CM

## 2023-02-03 DIAGNOSIS — E785 Hyperlipidemia, unspecified: Secondary | ICD-10-CM

## 2023-02-03 DIAGNOSIS — R2681 Unsteadiness on feet: Secondary | ICD-10-CM | POA: Diagnosis not present

## 2023-02-03 DIAGNOSIS — Z23 Encounter for immunization: Secondary | ICD-10-CM

## 2023-02-03 DIAGNOSIS — I159 Secondary hypertension, unspecified: Secondary | ICD-10-CM | POA: Diagnosis not present

## 2023-02-03 DIAGNOSIS — I4891 Unspecified atrial fibrillation: Secondary | ICD-10-CM | POA: Diagnosis not present

## 2023-02-03 DIAGNOSIS — E1122 Type 2 diabetes mellitus with diabetic chronic kidney disease: Secondary | ICD-10-CM

## 2023-02-03 DIAGNOSIS — I2510 Atherosclerotic heart disease of native coronary artery without angina pectoris: Secondary | ICD-10-CM

## 2023-02-03 MED ORDER — AMBULATORY NON FORMULARY MEDICATION: 0 refills | Status: DC

## 2023-02-03 NOTE — Assessment & Plan Note (Signed)
Continue on amiodarone.  Managed by cardiology.

## 2023-02-03 NOTE — Assessment & Plan Note (Signed)
BP is fairly well controlled. Readings are better at home.  He will continue to follow with advanced HTN clinic.

## 2023-02-03 NOTE — Assessment & Plan Note (Signed)
I have prescribed rollator walker for him.

## 2023-02-03 NOTE — Assessment & Plan Note (Signed)
Well controlled.  Managed by endocrinology.

## 2023-02-03 NOTE — Assessment & Plan Note (Signed)
He denies any anginal symptoms at this time.  Continue to control risk factors including blood pressure, diabetes and lipids.

## 2023-02-03 NOTE — Assessment & Plan Note (Signed)
Tolerating atorvastatin well, continue at current strength.   

## 2023-02-03 NOTE — Progress Notes (Signed)
[name] - 77 y.o. male MRN [id number]  Date of birth: 1945-09-05  Subjective Chief Complaint  Patient presents with   Blood Pressure Check    HPI [name] is a 77 y.o. male here today for follow up visit.   He reports that he is doing pretty well.   Remains on amlodipine, metoprolol and hydralazine for management of HTN.  BP is elevated on initial check.  Seeing advance HTN clinic.  He does also see general cardiology for history of A. Fib and remains on amiodarone.  This has been stable.  Denies anginal symptoms, headache, or vision changes.   Seeing endocrinology for mgmt of T2DM.  Last A1c was 6.5%.   Remains on chronic levaquin for management prosthetic hip infection  ROS:  A comprehensive ROS was completed and negative except as noted per HPI  Allergies  Allergen Reactions   Shellfish Allergy Rash    Past Medical History:  Diagnosis Date   Arthritis    Asthma    no inhaler   Atrial fibrillation (HCC) 10/16/2020   CKD (chronic kidney disease) 04/13/2020   Coronary artery disease    cardiologist-  dr spruill; last visit 3 mos ago per pt   Enterobacter sepsis (HCC) 04/13/2020   GERD (gastroesophageal reflux disease)    History of MI (myocardial infarction)    1985   Hydronephrosis, left    Hypertension    Myocardial infarction (HCC)    Nephrolithiasis    left   Neuromuscular disorder (HCC)    TINGLING IN BOTH HANDS   Presence of tooth-root and mandibular implants    lower dental implants   Prostate cancer (HCC)    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   QT prolongation 10/16/2020   Shortness of breath    WITH EXERTION   Thigh abscess 04/13/2020   Type 2 diabetes mellitus (HCC)    Vaccine counseling 04/28/2022   Zoster 09/13/2021    Past Surgical History:  Procedure Laterality Date   BUNIONECTOMY     CYSTOSCOPY W/ RETROGRADES Left 03/14/2014   Procedure: CYSTOSCOPY WITH LEFT RETROGRADE PYELOGRAM, Left Ureteroscopy, Lweft ureteral  Stent No string;  Surgeon: Danae Chen, MD;  Location: [facility];  Service: Urology;  Laterality: Left;   CYSTOSCOPY W/ URETERAL STENT PLACEMENT Bilateral 01/15/2020   Procedure: CYSTOSCOPY WITH RETROGRADE PYELOGRAM/URETERAL STENT PLACEMENT;  Surgeon: Sebastian Ache, MD;  Location: [facility];  Service: Urology;  Laterality: Bilateral;   CYSTOSCOPY/URETEROSCOPY/HOLMIUM LASER/STENT PLACEMENT Bilateral 03/17/2020   Procedure: CYSTOSCOPY/URETEROSCOPY/HOLMIUM LASER/STENT LEFT PLACEMENT;  Surgeon: Jerilee Field, MD;  Location: [facility];  Service: Urology;  Laterality: Bilateral;   HERNIA REPAIR     INCISION AND DRAINAGE Left 03/09/2020   Procedure: INCISION AND DRAINAGE LEFT HIP WITH LINER EXCHANGE;  Surgeon: Ollen Gross, MD;  Location: [facility];  Service: Orthopedics;  Laterality: Left;   IR FLUORO GUIDE CV LINE RIGHT  03/10/2020   IR RADIOLOGIST EVAL & MGMT  04/01/2020   IR RADIOLOGIST EVAL & MGMT  04/15/2020   IR RADIOLOGIST EVAL & MGMT  04/30/2020   IR REMOVAL TUN CV CATH W/O FL  04/22/2020   IR US GUIDE BX ASP/DRAIN  03/17/2020   IR US GUIDE VASC ACCESS RIGHT  03/10/2020   PROSTATE BIOPSY     TOTAL HIP ARTHROPLASTY Left 03-20-2009   TOTAL HIP ARTHROPLASTY  04/09/2012   Procedure: TOTAL HIP ARTHROPLASTY;  Surgeon: Loanne Drilling, MD;  Location: [facility];  Service: Orthopedics;  Laterality: Right;  TOTAL KNEE ARTHROPLASTY Left 05-16-2008    Social History   Socioeconomic History   Marital status: Widowed    Spouse name: Not on file   Number of children: 5   Years of education: 9   Highest education level: 9th grade  Occupational History   Occupation: Retired  Tobacco Use   Smoking status: Former    Current packs/day: 0.00    Average packs/day: 1 pack/day for 25.0 years (25.0 ttl pk-yrs)    Types: Cigarettes    Start date: 04/12/1959    Quit date: 04/11/1984    Years since quitting: 38.8   Smokeless tobacco: Former    Quit date: 04/02/1985  Vaping Use   Vaping status:  Never Used  Substance and Sexual Activity   Alcohol use: Not Currently    Comment: RARE   Drug use: No   Sexual activity: Not Currently  Other Topics Concern   Not on file  Social History Narrative   Lives alone. He had five children and one has deceased. He enjoys watching television.   Social Determinants of Health   Financial Resource Strain: Medium Risk (09/29/2022)   Overall Financial Resource Strain (CARDIA)    Difficulty of Paying Living Expenses: Somewhat hard  Food Insecurity: No Food Insecurity (09/27/2022)   Hunger Vital Sign    Worried About Running Out of Food in the Last Year: Never true    Ran Out of Food in the Last Year: Never true  Transportation Needs: No Transportation Needs (09/27/2022)   PRAPARE - Administrator, Civil Service (Medical): No    Lack of Transportation (Non-Medical): No  Physical Activity: Sufficiently Active (09/27/2022)   Exercise Vital Sign    Days of Exercise per Week: 3 days    Minutes of Exercise per Session: 60 min  Stress: No Stress Concern Present (09/27/2022)   [employer] of Occupational Health - Occupational Stress Questionnaire    Feeling of Stress : Not at all  Social Connections: Moderately Isolated (09/27/2022)   Social Connection and Isolation Panel [NHANES]    Frequency of Communication with Friends and Family: More than three times a week    Frequency of Social Gatherings with Friends and Family: More than three times a week    Attends Religious Services: More than 4 times per year    Active Member of [employer] or Organizations: No    Attends Banker Meetings: Never    Marital Status: Widowed    Family History  Problem Relation Age of Onset   Cancer Mother        breast   Multiple sclerosis Daughter    Cancer Father        prostate   Diabetes Father    Cancer Maternal Uncle        bone    Health Maintenance  Topic Date Due   INFLUENZA VACCINE  11/10/2022   Diabetic kidney evaluation -  Urine ACR  12/11/2022   COVID-19 Vaccine (3 - Mixed Product risk series) 05/20/2023 (Originally 04/07/2021)   Zoster Vaccines- Shingrix (1 of 2) 08/02/2023 (Originally 08/05/1964)   HEMOGLOBIN A1C  05/27/2023   FOOT EXAM  08/02/2023   Medicare Annual Wellness (AWV)  09/27/2023   OPHTHALMOLOGY EXAM  10/06/2023   Diabetic kidney evaluation - eGFR measurement  11/08/2023   DTaP/Tdap/Td (2 - Td or Tdap) 04/10/2025   Pneumonia Vaccine 39+ Years old  Completed   Hepatitis C Screening  Completed   HPV VACCINES  Aged Out  Colonoscopy  Discontinued     ----------------------------------------------------------------------------------------------------------------------------------------------------------------------------------------------------------------- Physical Exam BP (!) 143/67 (BP Location: Left Arm, Patient Position: Sitting, Cuff Size: Normal)   Pulse 60   Temp 97.6 F (36.4 C) (Oral)   Ht 6' (1.829 m)   Wt 218 lb 1.9 oz (98.9 kg)   SpO2 97%   BMI 29.58 kg/m   Physical Exam Constitutional:      Appearance: Normal appearance.  HENT:     Head: Normocephalic and atraumatic.  Eyes:     General: No scleral icterus. Cardiovascular:     Rate and Rhythm: Normal rate and regular rhythm.  Pulmonary:     Effort: Pulmonary effort is normal.     Breath sounds: Normal breath sounds.  Musculoskeletal:     Cervical back: Neck supple.  Neurological:     Mental Status: He is alert.  Psychiatric:        Mood and Affect: Mood normal.        Behavior: Behavior normal.     ------------------------------------------------------------------------------------------------------------------------------------------------------------------------------------------------------------------- Assessment and Plan  Type 2 diabetes mellitus with chronic kidney disease (HCC) Well controlled.  Managed by endocrinology.   CAD (coronary artery disease) He denies any anginal symptoms at this time.   Continue to control risk factors including blood pressure, diabetes and lipids.  Hyperlipidemia with target LDL less than 70 Tolerating atorvastatin well, continue at current strength.   Atrial fibrillation (HCC) Continue on amiodarone.  Managed by cardiology.    Gait instability I have prescribed rollator walker for him.   HTN (hypertension) BP is fairly well controlled. Readings are better at home.  He will continue to follow with advanced HTN clinic.     Meds ordered this encounter  Medications   AMBULATORY NON FORMULARY MEDICATION    Sig: Please provide rollator walker with hand brakes and seat.  Dx: R26.81 Gait instability    Dispense:  1 Device    Refill:  0    No follow-ups on file.    This visit occurred during the SARS-CoV-2 public health emergency.  Safety protocols were in place, including screening questions prior to the visit, additional usage of staff PPE, and extensive cleaning of exam room while observing appropriate contact time as indicated for disinfecting solutions.

## 2023-02-15 ENCOUNTER — Other Ambulatory Visit: Payer: Self-pay | Admitting: Family Medicine

## 2023-02-15 DIAGNOSIS — I159 Secondary hypertension, unspecified: Secondary | ICD-10-CM

## 2023-02-17 ENCOUNTER — Other Ambulatory Visit (INDEPENDENT_AMBULATORY_CARE_PROVIDER_SITE_OTHER): Payer: PPO

## 2023-02-17 DIAGNOSIS — E038 Other specified hypothyroidism: Secondary | ICD-10-CM

## 2023-02-17 DIAGNOSIS — E11649 Type 2 diabetes mellitus with hypoglycemia without coma: Secondary | ICD-10-CM | POA: Diagnosis not present

## 2023-02-17 LAB — MICROALBUMIN / CREATININE URINE RATIO
Creatinine,U: 118.6 mg/dL
Microalb Creat Ratio: 2.1 mg/g (ref 0.0–30.0)
Microalb, Ur: 2.5 mg/dL — ABNORMAL HIGH (ref 0.0–1.9)

## 2023-02-17 LAB — TSH: TSH: 1.35 u[IU]/mL (ref 0.35–5.50)

## 2023-02-18 LAB — LIPID PANEL
Cholesterol: 129 mg/dL (ref <200)
HDL: 39 mg/dL — ABNORMAL LOW (ref >=40)
LDL Cholesterol (Calc): 67 mg/dL
Non-HDL Cholesterol (Calc): 90 mg/dL (ref <130)
Total CHOL/HDL Ratio: 3.3 (calc) (ref <5.0)
Triglycerides: 157 mg/dL — ABNORMAL HIGH (ref <150)

## 2023-02-20 ENCOUNTER — Other Ambulatory Visit: Payer: Self-pay | Admitting: "Endocrinology

## 2023-02-20 DIAGNOSIS — E038 Other specified hypothyroidism: Secondary | ICD-10-CM

## 2023-02-22 DIAGNOSIS — N281 Cyst of kidney, acquired: Secondary | ICD-10-CM | POA: Diagnosis not present

## 2023-02-22 DIAGNOSIS — N401 Enlarged prostate with lower urinary tract symptoms: Secondary | ICD-10-CM | POA: Diagnosis not present

## 2023-02-22 DIAGNOSIS — R351 Nocturia: Secondary | ICD-10-CM | POA: Diagnosis not present

## 2023-02-22 DIAGNOSIS — N200 Calculus of kidney: Secondary | ICD-10-CM | POA: Diagnosis not present

## 2023-02-27 ENCOUNTER — Ambulatory Visit (INDEPENDENT_AMBULATORY_CARE_PROVIDER_SITE_OTHER): Payer: PPO | Admitting: "Endocrinology

## 2023-02-27 ENCOUNTER — Encounter: Payer: Self-pay | Admitting: "Endocrinology

## 2023-02-27 VITALS — BP 140/54 | HR 63 | Ht 72.0 in | Wt 219.4 lb

## 2023-02-27 DIAGNOSIS — E7800 Pure hypercholesterolemia, unspecified: Secondary | ICD-10-CM

## 2023-02-27 DIAGNOSIS — E038 Other specified hypothyroidism: Secondary | ICD-10-CM | POA: Diagnosis not present

## 2023-02-27 DIAGNOSIS — E1140 Type 2 diabetes mellitus with diabetic neuropathy, unspecified: Secondary | ICD-10-CM | POA: Diagnosis not present

## 2023-02-27 DIAGNOSIS — Z7984 Long term (current) use of oral hypoglycemic drugs: Secondary | ICD-10-CM | POA: Diagnosis not present

## 2023-02-27 DIAGNOSIS — E1165 Type 2 diabetes mellitus with hyperglycemia: Secondary | ICD-10-CM | POA: Diagnosis not present

## 2023-02-27 LAB — POCT GLYCOSYLATED HEMOGLOBIN (HGB A1C): Hemoglobin A1C: 6.9 % — AB (ref 4.0–5.6)

## 2023-02-27 NOTE — Patient Instructions (Signed)

## 2023-02-27 NOTE — Progress Notes (Signed)
Outpatient Endocrinology Note Aaron Birch Tree, MD  02/27/23   [name] January 11, 1946 [id number]  Referring Provider: Everrett Coombe, DO Primary Care Provider: Everrett Coombe, DO Reason for consultation: Subjective   Assessment & Plan  Diagnoses and all orders for this visit:  Type 2 diabetes mellitus with diabetic neuropathy, without long-term current use of insulin (HCC) -     POCT glycosylated hemoglobin (Hb A1C)  Subclinical hypothyroidism  Long term (current) use of oral hypoglycemic drugs  Pure hypercholesterolemia   On levothyroxine 75 mcg po every day TSH WNL, FT4 checked previously was normal Continue current dose   Diabetes complicated by neuropathy, nephropathy, CAD Hba1c goal less than 7.0, current Hba1c is 6.9% Will recommend for the following change of medications to: Continue Jardiance 25 mg every day  Januvia 50 mg every day-PRN BG >200  No known contraindications/side effects to any of above medications No UTI  Hyperlipidemia -Last LDL at goal: 53 -on rosuvastatin 40 mg QD -Follow low fat diet and exercise   -Blood pressure goal <140/90 - Microalbumin/creatinine goal < 30 -Nor on ACE/ARB, due seeing nephrology -diet changes including salt restriction -limit eating outside -counseled BP targets per standards of diabetes care -Uncontrolled blood pressure can lead to retinopathy, nephropathy and cardiovascular and atherosclerotic heart disease  -Newly diagnosed subclinical hypothyroidism  TSH persistently 9-12, with normal Total T4 Start levothyroxine 75 mcg qam   Reviewed and counseled on: -A1C target -Blood sugar targets -Complications of uncontrolled diabetes  -Checking blood sugar before meals and bedtime and bring log next visit -All medications with mechanism of action and side effects -Hypoglycemia management: rule of 15's, Glucagon Emergency Kit and medical alert ID -low-carb low-fat plate-method diet -At least 20  minutes of physical activity per day -Annual dilated retinal eye exam and foot exam -compliance and follow up needs -follow up as scheduled or earlier if problem gets worse  Call if blood sugar is less than 70 or consistently above 250    Take a 15 gm snack of carbohydrate at bedtime before you go to sleep if your blood sugar is less than 100.    If you are going to fast after midnight for a test or procedure, ask your physician for instructions on how to reduce/decrease your insulin dose.    Call if blood sugar is less than 70 or consistently above 250  -Treating a low sugar by rule of 15  (15 gms of sugar every 15 min until sugar is more than 70) If you feel your sugar is low, test your sugar to be sure If your sugar is low (less than 70), then take 15 grams of a fast acting Carbohydrate (3-4 glucose tablets or glucose gel or 4 ounces of juice or regular soda) Recheck your sugar 15 min after treating low to make sure it is more than 70 If sugar is still less than 70, treat again with 15 grams of carbohydrate          Don't drive the hour of hypoglycemia  If unconscious/unable to eat or drink by mouth, use glucagon injection or nasal spray baqsimi and call 911. Can repeat again in 15 min if still unconscious.  Return in about 3 months (around 05/30/2023).   I have reviewed current medications, nurse's notes, allergies, vital signs, past medical and surgical history, family medical history, and social history for this encounter. Counseled patient on symptoms, examination findings, lab findings, imaging results, treatment decisions and monitoring and prognosis. The patient  understood the recommendations and agrees with the treatment plan. All questions regarding treatment plan were fully answered.  Aaron Viera East, MD  02/27/23    History of Present Illness [name] is a 77 y.o. year old male who presents for evaluation of Type 2 diabetes mellitus.  [name] was first  diagnosed in 2001.   Diabetes education +  Currently taking, Jardiance 25 mg every day  Stopped Januvia 50 mg every day  Previous history: Non-insulin hypoglycemic drugs: Januvia 50 mg daily (gets samples for the year by another provider),  Took Prandin 0.5 mg qam, jardiance 10 mg every day without issues  He was previously on metformin which was stopped because of renal dysfunction Amaryl was added in 2013 starting at 4 mg daily Januvia was started in 2018  Side effects from medications: None  COMPLICATIONS +  MI , - Stroke -  retinopathy, last eye exam 2023 +  neuropathy +  nephropathy  BLOOD SUGAR DATA  CGM interpretation: At today's visit, we reviewed her CGM downloads. The full report is scanned in the media. Reviewing the CGM trends, BG are well controlled throughout the day.  Physical Exam  BP (!) 140/54   Pulse 63   Ht 6' (1.829 m)   Wt 219 lb 6.4 oz (99.5 kg)   SpO2 95%   BMI 29.76 kg/m    Constitutional: well developed, well nourished Head: normocephalic, atraumatic Eyes: sclera anicteric, no redness Neck: supple Lungs: normal respiratory effort Neurology: alert and oriented Skin: dry, no appreciable rashes Musculoskeletal: no appreciable defects Psychiatric: normal mood and affect Diabetic Foot Exam - Simple   No data filed      Current Medications Patient's Medications  New Prescriptions   No medications on file  Previous Medications   ALBUTEROL (VENTOLIN HFA) 108 (90 BASE) MCG/ACT INHALER    Inhale 2 puffs into the lungs every 6 (six) hours as needed for wheezing or shortness of breath.   ALLOPURINOL (ZYLOPRIM) 100 MG TABLET    TAKE 1/2 TABLET BY MOUTH DAILY   AMBULATORY NON FORMULARY MEDICATION    Please provide shower bench.  R26.81 Gait instability   AMBULATORY NON FORMULARY MEDICATION    Please provide rollator walker with hand brakes and seat.  Dx: R26.81 Gait instability   AMIODARONE (PACERONE) 200 MG TABLET    Take 200 mg by mouth daily.  [name]   AMLODIPINE (NORVASC) 10 MG TABLET    TAKE 1 TABLET BY MOUTH EVERY DAY   ASPIRIN EC 81 MG TABLET    Take 81 mg by mouth daily. Swallow whole.   COLCHICINE 0.6 MG TABLET    TAKE 1 TABLET BY MOUTH EVERY DAY   CONTINUOUS GLUCOSE SENSOR (FREESTYLE LIBRE 3 SENSOR) MISC    USE 1 AS DIRECTED EVERY 14 DAYS TO CHECK GLUCOSE CONTINUOUSLY   DOCUSATE SODIUM (COLACE) 100 MG CAPSULE    Take 100 mg by mouth daily as needed for mild constipation.   EMPAGLIFLOZIN (JARDIANCE) 25 MG TABS TABLET    Take 1 tablet (25 mg total) by mouth daily before breakfast.   FERROUS SULFATE 325 (65 FE) MG TABLET    Take 325 mg by mouth daily with breakfast.   FUROSEMIDE (LASIX) 20 MG TABLET    Take 1 tablet (20 mg total) by mouth daily.   HYDRALAZINE (APRESOLINE) 100 MG TABLET    Take 1 tablet (100 mg total) by mouth 3 (three) times daily.   LEVOFLOXACIN (LEVAQUIN) 500 MG TABLET  Take 1 tablet by mouth daily.   LEVOTHYROXINE (SYNTHROID) 75 MCG TABLET    TAKE 1 TABLET BY MOUTH EVERY DAY   METOPROLOL SUCCINATE (TOPROL-XL) 50 MG 24 HR TABLET    Take 50 mg by mouth daily.   OMEPRAZOLE (PRILOSEC) 40 MG CAPSULE    TAKE 1 CAPSULE BY MOUTH EVERY MORNING AND AT BEDTIME   ROSUVASTATIN (CRESTOR) 40 MG TABLET    Take 40 mg by mouth daily.   SILVER SULFADIAZINE (SILVADENE) 1 % CREAM    Apply 1 Application topically daily.   SPIRONOLACTONE (ALDACTONE) 25 MG TABLET    Take 0.5 tablets (12.5 mg total) by mouth daily.  Modified Medications   No medications on file  Discontinued Medications   No medications on file    Allergies Allergies  Allergen Reactions   Shellfish Allergy Rash    Past Medical History Past Medical History:  Diagnosis Date   Arthritis    Asthma    no inhaler   Atrial fibrillation (HCC) 10/16/2020   CKD (chronic kidney disease) 04/13/2020   Coronary artery disease    cardiologist-  dr spruill; last visit 3 mos ago per pt   Enterobacter sepsis (HCC) 04/13/2020   GERD (gastroesophageal reflux  disease)    History of MI (myocardial infarction)    1985   Hydronephrosis, left    Hypertension    Myocardial infarction (HCC)    Nephrolithiasis    left   Neuromuscular disorder (HCC)    TINGLING IN BOTH HANDS   Presence of tooth-root and mandibular implants    lower dental implants   Prostate cancer (HCC)    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   QT prolongation 10/16/2020   Shortness of breath    WITH EXERTION   Thigh abscess 04/13/2020   Type 2 diabetes mellitus (HCC)    Vaccine counseling 04/28/2022   Zoster 09/13/2021    Past Surgical History Past Surgical History:  Procedure Laterality Date   BUNIONECTOMY     CYSTOSCOPY W/ RETROGRADES Left 03/14/2014   Procedure: CYSTOSCOPY WITH LEFT RETROGRADE PYELOGRAM, Left Ureteroscopy, [place] ureteral Stent No string;  Surgeon: Danae Chen, MD;  Location: [facility] [place];  Service: Urology;  Laterality: Left;   CYSTOSCOPY W/ URETERAL STENT PLACEMENT Bilateral 01/15/2020   Procedure: CYSTOSCOPY WITH RETROGRADE PYELOGRAM/URETERAL STENT PLACEMENT;  Surgeon: Sebastian Ache, MD;  Location: [facility];  Service: Urology;  Laterality: Bilateral;   CYSTOSCOPY/URETEROSCOPY/HOLMIUM LASER/STENT PLACEMENT Bilateral 03/17/2020   Procedure: CYSTOSCOPY/URETEROSCOPY/HOLMIUM LASER/STENT LEFT PLACEMENT;  Surgeon: Jerilee Field, MD;  Location: [facility];  Service: Urology;  Laterality: Bilateral;   HERNIA REPAIR     INCISION AND DRAINAGE Left 03/09/2020   Procedure: INCISION AND DRAINAGE LEFT HIP WITH LINER EXCHANGE;  Surgeon: Ollen Gross, MD;  Location: [facility];  Service: Orthopedics;  Laterality: Left;   IR FLUORO GUIDE CV LINE RIGHT  03/10/2020   IR RADIOLOGIST EVAL & MGMT  04/01/2020   IR RADIOLOGIST EVAL & MGMT  04/15/2020   IR RADIOLOGIST EVAL & MGMT  04/30/2020   IR REMOVAL TUN CV CATH W/O FL  04/22/2020   IR US GUIDE BX ASP/DRAIN  03/17/2020   IR US GUIDE VASC ACCESS RIGHT  03/10/2020   PROSTATE BIOPSY     TOTAL HIP ARTHROPLASTY  Left 03-20-2009   TOTAL HIP ARTHROPLASTY  04/09/2012   Procedure: TOTAL HIP ARTHROPLASTY;  Surgeon: Loanne Drilling, MD;  Location: [facility];  Service: Orthopedics;  Laterality: Right;   TOTAL KNEE ARTHROPLASTY Left 05-16-2008  Family History family history includes Cancer in his father, maternal uncle, and mother; Diabetes in his father; Multiple sclerosis in his daughter.  Social History Social History   Socioeconomic History   Marital status: Widowed    Spouse name: Not on file   Number of children: 5   Years of education: 9   Highest education level: 9th grade  Occupational History   Occupation: Retired  Tobacco Use   Smoking status: Former    Current packs/day: 0.00    Average packs/day: 1 pack/day for 25.0 years (25.0 ttl pk-yrs)    Types: Cigarettes    Start date: 04/12/1959    Quit date: 04/11/1984    Years since quitting: 38.9   Smokeless tobacco: Former    Quit date: 04/02/1985  Vaping Use   Vaping status: Never Used  Substance and Sexual Activity   Alcohol use: Not Currently    Comment: RARE   Drug use: No   Sexual activity: Not Currently  Other Topics Concern   Not on file  Social History Narrative   Lives alone. He had five children and one has deceased. He enjoys watching television.   Social Determinants of Health   Financial Resource Strain: Medium Risk (09/29/2022)   Overall Financial Resource Strain (CARDIA)    Difficulty of Paying Living Expenses: Somewhat hard  Food Insecurity: No Food Insecurity (09/27/2022)   Hunger Vital Sign    Worried About Running Out of Food in the Last Year: Never true    Ran Out of Food in the Last Year: Never true  Transportation Needs: No Transportation Needs (09/27/2022)   PRAPARE - Administrator, Civil Service (Medical): No    Lack of Transportation (Non-Medical): No  Physical Activity: Sufficiently Active (09/27/2022)   Exercise Vital Sign    Days of Exercise per Week: 3 days    Minutes of Exercise per  Session: 60 min  Stress: No Stress Concern Present (09/27/2022)   [employer] of Occupational Health - Occupational Stress Questionnaire    Feeling of Stress : Not at all  Social Connections: Moderately Isolated (09/27/2022)   Social Connection and Isolation Panel [NHANES]    Frequency of Communication with Friends and Family: More than three times a week    Frequency of Social Gatherings with Friends and Family: More than three times a week    Attends Religious Services: More than 4 times per year    Active Member of [employer] or Organizations: No    Attends Banker Meetings: Never    Marital Status: Widowed  Intimate Partner Violence: Not At Risk (09/27/2022)   Humiliation, Afraid, Rape, and Kick questionnaire    Fear of Current or Ex-Partner: No    Emotionally Abused: No    Physically Abused: No    Sexually Abused: No    Lab Results  Component Value Date   HGBA1C 6.9 (A) 02/27/2023   Lab Results  Component Value Date   CHOL 129 02/17/2023   Lab Results  Component Value Date   HDL 39 (L) 02/17/2023   Lab Results  Component Value Date   LDLCALC 67 02/17/2023   Lab Results  Component Value Date   TRIG 157 (H) 02/17/2023   Lab Results  Component Value Date   CHOLHDL 3.3 02/17/2023   Lab Results  Component Value Date   CREATININE 1.92 (H) 11/08/2022   Lab Results  Component Value Date   GFR 40.72 (L) 12/10/2021   Lab Results  Component Value  Date   MICROALBUR 2.5 (H) 02/17/2023      Component Value Date/Time   NA 137 11/08/2022 0846   NA 140 08/17/2022 0847   K 4.5 11/08/2022 0846   CL 108 11/08/2022 0846   CO2 21 11/08/2022 0846   GLUCOSE 134 (H) 11/08/2022 0846   BUN 28 (H) 11/08/2022 0846   BUN 39 (H) 08/17/2022 0847   CREATININE 1.92 (H) 11/08/2022 0846   CALCIUM 9.2 11/08/2022 0846   PROT 7.0 05/24/2022 0708   ALBUMIN 4.1 05/24/2022 0708   AST 19 05/24/2022 0708   ALT 20 05/24/2022 0708   ALKPHOS 65 05/24/2022 0708   BILITOT 0.2  05/24/2022 0708   GFRNONAA 45 (L) 05/08/2021 0420   GFRNONAA 58 (L) 10/16/2020 0916   GFRAA 67 10/16/2020 0916      Latest Ref Rng & Units 11/08/2022    8:46 AM 08/17/2022    8:47 AM 08/08/2022    8:35 AM  BMP  Glucose 65 - 99 mg/dL 784  86  696   BUN 7 - 25 mg/dL 28  39  35   Creatinine 0.70 - 1.28 mg/dL 2.95  2.84  1.32   BUN/Creat Ratio 6 - 22 (calc) 15  20  17    Sodium 135 - 146 mmol/L 137  140  140   Potassium 3.5 - 5.3 mmol/L 4.5  4.2  5.3   Chloride 98 - 110 mmol/L 108  108  108   CO2 20 - 32 mmol/L 21  20  16    Calcium 8.6 - 10.3 mg/dL 9.2  8.6  8.9        Component Value Date/Time   WBC 4.1 11/08/2022 0846   RBC 3.12 (L) 11/08/2022 0846   HGB 10.0 (L) 11/08/2022 0846   HCT 29.5 (L) 11/08/2022 0846   PLT 296 11/08/2022 0846   MCV 94.6 11/08/2022 0846   MCH 32.1 11/08/2022 0846   MCHC 33.9 11/08/2022 0846   RDW 13.1 11/08/2022 0846   LYMPHSABS 1,111 11/08/2022 0846   MONOABS 0.6 08/01/2020 0837   EOSABS 90 11/08/2022 0846   BASOSABS 8 11/08/2022 0846     Parts of this note may have been dictated using voice recognition software. There may be variances in spelling and vocabulary which are unintentional. Not all errors are proofread. Please notify the Thereasa Parkin if any discrepancies are noted or if the meaning of any statement is not clear.

## 2023-02-28 DIAGNOSIS — H35371 Puckering of macula, right eye: Secondary | ICD-10-CM | POA: Diagnosis not present

## 2023-02-28 DIAGNOSIS — H35372 Puckering of macula, left eye: Secondary | ICD-10-CM | POA: Diagnosis not present

## 2023-03-14 DIAGNOSIS — I129 Hypertensive chronic kidney disease with stage 1 through stage 4 chronic kidney disease, or unspecified chronic kidney disease: Secondary | ICD-10-CM | POA: Diagnosis not present

## 2023-03-14 DIAGNOSIS — N1832 Chronic kidney disease, stage 3b: Secondary | ICD-10-CM | POA: Diagnosis not present

## 2023-03-14 DIAGNOSIS — N1330 Unspecified hydronephrosis: Secondary | ICD-10-CM | POA: Diagnosis not present

## 2023-03-14 DIAGNOSIS — E1122 Type 2 diabetes mellitus with diabetic chronic kidney disease: Secondary | ICD-10-CM | POA: Diagnosis not present

## 2023-03-14 DIAGNOSIS — N200 Calculus of kidney: Secondary | ICD-10-CM | POA: Diagnosis not present

## 2023-03-14 LAB — LAB REPORT - SCANNED
Albumin, Urine POC: 49.9
Creatinine, POC: 98.9 mg/dL
EGFR: 30
Microalb Creat Ratio: 50

## 2023-03-18 ENCOUNTER — Other Ambulatory Visit: Payer: Self-pay | Admitting: Family Medicine

## 2023-03-28 DIAGNOSIS — N1832 Chronic kidney disease, stage 3b: Secondary | ICD-10-CM | POA: Diagnosis not present

## 2023-04-13 DIAGNOSIS — M1711 Unilateral primary osteoarthritis, right knee: Secondary | ICD-10-CM | POA: Diagnosis not present

## 2023-04-17 DIAGNOSIS — E785 Hyperlipidemia, unspecified: Secondary | ICD-10-CM | POA: Diagnosis not present

## 2023-04-17 DIAGNOSIS — I10 Essential (primary) hypertension: Secondary | ICD-10-CM | POA: Diagnosis not present

## 2023-04-17 DIAGNOSIS — E119 Type 2 diabetes mellitus without complications: Secondary | ICD-10-CM | POA: Diagnosis not present

## 2023-04-17 DIAGNOSIS — N1832 Chronic kidney disease, stage 3b: Secondary | ICD-10-CM | POA: Diagnosis not present

## 2023-04-25 DIAGNOSIS — H35352 Cystoid macular degeneration, left eye: Secondary | ICD-10-CM | POA: Diagnosis not present

## 2023-04-25 DIAGNOSIS — H35372 Puckering of macula, left eye: Secondary | ICD-10-CM | POA: Diagnosis not present

## 2023-04-25 DIAGNOSIS — H26491 Other secondary cataract, right eye: Secondary | ICD-10-CM | POA: Diagnosis not present

## 2023-04-25 DIAGNOSIS — H4323 Crystalline deposits in vitreous body, bilateral: Secondary | ICD-10-CM | POA: Diagnosis not present

## 2023-04-25 DIAGNOSIS — H33022 Retinal detachment with multiple breaks, left eye: Secondary | ICD-10-CM | POA: Diagnosis not present

## 2023-04-25 DIAGNOSIS — H35371 Puckering of macula, right eye: Secondary | ICD-10-CM | POA: Diagnosis not present

## 2023-04-27 DIAGNOSIS — N1832 Chronic kidney disease, stage 3b: Secondary | ICD-10-CM | POA: Diagnosis not present

## 2023-05-05 ENCOUNTER — Telehealth: Payer: Self-pay

## 2023-05-05 NOTE — Telephone Encounter (Signed)
Sample will be placed up front for patient to pick up   Medication Samples have been provided to the patient.  Drug name: Jardiance        Strength: 25mg          Qty: 4 boxes 7 tabs in each   LOT: [id number]  Exp.Date: 05/2024  Dosing instructions: Take 1 tablet daily   The patient has been instructed regarding the correct time, dose, and frequency of taking this medication, including desired effects and most common side effects.   [name] [name] 12:41 PM 05/05/2023

## 2023-05-05 NOTE — Telephone Encounter (Signed)
PAP: Patient assistance application for Jardiance through [employer]) has been mailed to pt's home address on file. Provider portion of application will be faxed to provider's office.

## 2023-05-08 ENCOUNTER — Other Ambulatory Visit: Payer: Self-pay

## 2023-05-08 NOTE — Progress Notes (Unsigned)
   05/08/2023  Patient ID: AMILIO ZEHNDER, male   DOB: 1945/07/22, 78 y.o.   MRN: [id number]  Subjective/objective Patient out reach to follow-up regarding patient assistance programs  Medication access/affordability -Mr. Klabunde is currently enrolled in [employer] patient assistance for Jardiance 25 mg daily and medication assistance team is working with patient and provider for 2025 reenrollment.  Patient is currently out of this medication. -Patient is also currently enrolled in [employer] patient assistance program, but this program is going away in 2025 -Patient's co-pay for 1 month supply of colchicine 0.5 mg daily would be $37, or a 46-month supply would be $110.  Co-pay on a discount card versus insurance would be $27 for 1 month.  Assessment/plan  Medication access/affordability -Endocrinology has a 4-week sample of Jardiance that patient is going to pick up today while waiting for additional refills from patient assistance program -Discussed options for colchicine affordability, as well as looking at cheaper alternative.  Patient prefers to stay on colchicine 0.6 mg daily based on efficacy.  He would like a 30-day supply with refills sent to his pharmacy along with discount card information.  Prescription pending for Dr. Ashley Royalty to sign if in agreement.  Follow-up: 3 months  Lenna Gilford, PharmD, DPLA

## 2023-05-09 ENCOUNTER — Other Ambulatory Visit: Payer: PPO

## 2023-05-09 MED ORDER — COLCHICINE 0.6 MG PO TABS: 0.6000 mg | ORAL_TABLET | Freq: Every day | ORAL | 12 refills | Status: DC

## 2023-05-10 ENCOUNTER — Encounter (HOSPITAL_COMMUNITY): Payer: Self-pay | Admitting: Emergency Medicine

## 2023-05-10 ENCOUNTER — Emergency Department (HOSPITAL_COMMUNITY): Payer: PPO

## 2023-05-10 ENCOUNTER — Emergency Department (HOSPITAL_COMMUNITY)
Admission: EM | Admit: 2023-05-10 | Discharge: 2023-05-11 | Disposition: A | Discharge status: Home / Self Care | Payer: PPO | Attending: Student | Admitting: Student

## 2023-05-10 ENCOUNTER — Other Ambulatory Visit: Payer: Self-pay

## 2023-05-10 DIAGNOSIS — W19XXXA Unspecified fall, initial encounter: Secondary | ICD-10-CM | POA: Insufficient documentation

## 2023-05-10 DIAGNOSIS — E119 Type 2 diabetes mellitus without complications: Secondary | ICD-10-CM | POA: Insufficient documentation

## 2023-05-10 DIAGNOSIS — I2510 Atherosclerotic heart disease of native coronary artery without angina pectoris: Secondary | ICD-10-CM | POA: Insufficient documentation

## 2023-05-10 DIAGNOSIS — Z79899 Other long term (current) drug therapy: Secondary | ICD-10-CM | POA: Diagnosis not present

## 2023-05-10 DIAGNOSIS — S0990XA Unspecified injury of head, initial encounter: Secondary | ICD-10-CM

## 2023-05-10 DIAGNOSIS — J329 Chronic sinusitis, unspecified: Secondary | ICD-10-CM | POA: Diagnosis not present

## 2023-05-10 DIAGNOSIS — I10 Essential (primary) hypertension: Secondary | ICD-10-CM | POA: Insufficient documentation

## 2023-05-10 DIAGNOSIS — S060X0A Concussion without loss of consciousness, initial encounter: Secondary | ICD-10-CM | POA: Diagnosis not present

## 2023-05-10 DIAGNOSIS — Z7982 Long term (current) use of aspirin: Secondary | ICD-10-CM | POA: Diagnosis not present

## 2023-05-10 DIAGNOSIS — R42 Dizziness and giddiness: Secondary | ICD-10-CM | POA: Diagnosis not present

## 2023-05-10 DIAGNOSIS — I6523 Occlusion and stenosis of bilateral carotid arteries: Secondary | ICD-10-CM | POA: Diagnosis not present

## 2023-05-10 DIAGNOSIS — R519 Headache, unspecified: Secondary | ICD-10-CM | POA: Diagnosis not present

## 2023-05-10 MED ORDER — ACETAMINOPHEN 325 MG PO TABS
650.0000 mg | ORAL_TABLET | Freq: Once | ORAL | Status: AC
  Administered 2023-05-10: 650 mg via ORAL
  Filled 2023-05-10: qty 2

## 2023-05-10 NOTE — ED Triage Notes (Signed)
Patient to ED by POV for a fall. He states he fell today and hit his head on the ground causing dizziness. He c/o headaches and blurred vision.

## 2023-05-10 NOTE — Discharge Instructions (Addendum)
Please use Tylenol or ibuprofen for pain.  You may use 600 mg ibuprofen every 6 hours or 1000 mg of Tylenol every 6 hours.  You may choose to alternate between the 2.  This would be most effective.  Not to exceed 4 g of Tylenol within 24 hours.  Not to exceed 3200 mg ibuprofen 24 hours.  Please return if your headache does not improve over the next several days or is worsening despite treatment.

## 2023-05-10 NOTE — ED Provider Notes (Signed)
[name] Provider Note   CSN: [id number] Arrival date & time: 05/10/23  1329     History  Chief Complaint  Patient presents with   Head Injury    [name] is a 78 y.o. male with past medical history seen for hypertension, diabetes, coronary artery disease, GERD Who does not take any blood thinners presents with mechanical, nonsyncopal fall, and head injury.  Patient reports that he stumbled over his feet due to something on the ground, and struck the right side of his head.  He denies loss of conscious.  He reports some mild headache that feels almost like a hangover, some right neck and shoulder pain.  He did not take anything for pain prior to arrival.  He reports some brief dizziness, blurry vision which has since resolved.  He denies any nausea, vomiting, he denies any numbness, tingling.   Head Injury      Home Medications Prior to Admission medications   Medication Sig Start Date End Date Taking? Authorizing Provider  albuterol (VENTOLIN HFA) 108 (90 Base) MCG/ACT inhaler Inhale 2 puffs into the lungs every 6 (six) hours as needed for wheezing or shortness of breath. 06/09/21   Everrett Coombe, DO  allopurinol (ZYLOPRIM) 100 MG tablet TAKE 1/2 TABLET BY MOUTH DAILY 03/20/23   Everrett Coombe, DO  amiodarone (PACERONE) 200 MG tablet Take 200 mg by mouth daily. [name]    [provider]  amLODipine (NORVASC) 10 MG tablet TAKE 1 TABLET BY MOUTH EVERY DAY 02/15/23   Everrett Coombe, DO  aspirin EC 81 MG tablet Take 81 mg by mouth daily. Swallow whole.    [provider]  chlorthalidone (HYGROTON) 25 MG tablet Take 25 mg by mouth 3 (three) times a week. 03/15/23   [provider]  colchicine 0.6 MG tablet Take 1 tablet (0.6 mg total) by mouth daily. Please bill discount card vs insurance- BIN [phone], PCN [id number], Group [id number], Member ID [id number] 05/09/23   Everrett Coombe, DO  Continuous Glucose Sensor  (FREESTYLE LIBRE 3 SENSOR) MISC USE 1 AS DIRECTED EVERY 14 DAYS TO CHECK GLUCOSE CONTINUOUSLY 08/02/22   Everrett Coombe, DO  docusate sodium (COLACE) 100 MG capsule Take 100 mg by mouth daily as needed for mild constipation.    [provider]  empagliflozin (JARDIANCE) 25 MG TABS tablet Take 1 tablet (25 mg total) by mouth daily before breakfast. 11/24/22   Motwani, Carin Hock, MD  ferrous sulfate 325 (65 FE) MG tablet Take 325 mg by mouth daily with breakfast.    [provider]  furosemide (LASIX) 20 MG tablet Take 1 tablet (20 mg total) by mouth daily. 01/17/23 01/12/24  Alver Sorrow, NP  hydrALAZINE (APRESOLINE) 100 MG tablet Take 1 tablet (100 mg total) by mouth 3 (three) times daily. 01/17/23   Alver Sorrow, NP  levofloxacin (LEVAQUIN) 500 MG tablet Take 1 tablet by mouth daily. 11/08/22   Randall Hiss, MD  levothyroxine (SYNTHROID) 75 MCG tablet TAKE 1 TABLET BY MOUTH EVERY DAY 02/22/23   Altamese , MD  metoprolol succinate (TOPROL-XL) 50 MG 24 hr tablet Take 50 mg by mouth daily. 08/09/20   [provider]  omeprazole (PRILOSEC) 40 MG capsule TAKE 1 CAPSULE BY MOUTH EVERY MORNING AND AT BEDTIME 12/23/22   Everrett Coombe, DO  rosuvastatin (CRESTOR) 40 MG tablet Take 40 mg by mouth daily. 12/10/22   [provider]  silver sulfADIAZINE (SILVADENE) 1 % cream  Apply 1 Application topically daily. 10/31/22   McCaughan, Dia D, [name]  spironolactone (ALDACTONE) 25 MG tablet Take 0.5 tablets (12.5 mg total) by mouth daily. 01/17/23   Alver Sorrow, NP      Allergies    Shellfish allergy    Review of Systems   Review of Systems  All other systems reviewed and are negative.   Physical Exam Updated Vital Signs BP (!) 161/68 (BP Location: Left Arm)   Pulse 63   Temp 98.2 F (36.8 C) (Oral)   Resp 18   Ht 6\' 2"  (1.88 m)   Wt 97.5 kg   SpO2 99%   BMI 27.60 kg/m  Physical Exam Vitals and nursing note reviewed.  Constitutional:      General:  He is not in acute distress.    Appearance: Normal appearance.  HENT:     Head: Normocephalic and atraumatic.  Eyes:     General:        Right eye: No discharge.        Left eye: No discharge.  Cardiovascular:     Rate and Rhythm: Normal rate and regular rhythm.     Heart sounds: No murmur heard.    No friction rub. No gallop.  Pulmonary:     Effort: Pulmonary effort is normal.     Breath sounds: Normal breath sounds.  Abdominal:     General: Bowel sounds are normal.     Palpations: Abdomen is soft.  Musculoskeletal:     Comments: Mild tenderness to palpation cervical paraspinous muscles on the right, right shoulder with no step-off, deformity.  Intact crossarm adduction on the right, and left.  Intact strength 5/5 of bilateral upper and lower extremities.  Normal sensation throughout.  Skin:    General: Skin is warm and dry.     Capillary Refill: Capillary refill takes less than 2 seconds.  Neurological:     Mental Status: He is alert and oriented to person, place, and time.     Comments: Cranial nerves II through XII grossly intact.  Intact finger-nose, intact heel-to-shin.  Romberg negative, gait normal.  Alert and oriented x3.  Moves all 4 limbs spontaneously, normal coordination.  No pronator drift.  Intact strength 5 out of 5 bilateral upper and lower extremities.  Psychiatric:        Mood and Affect: Mood normal.        Behavior: Behavior normal.     ED Results / Procedures / Treatments   Labs (all labs ordered are listed, but only abnormal results are displayed) Labs Reviewed - No data to display  EKG None  Radiology CT Head Wo Contrast Result Date: 05/10/2023 CLINICAL DATA:  Fall and trauma to the head and neck. Dizziness. Blurry vision. Headache. EXAM: CT HEAD WITHOUT CONTRAST CT CERVICAL SPINE WITHOUT CONTRAST TECHNIQUE: Multidetector CT imaging of the head and cervical spine was performed following the standard protocol without intravenous contrast. Multiplanar  CT image reconstructions of the cervical spine were also generated. RADIATION DOSE REDUCTION: This exam was performed according to the departmental dose-optimization program which includes automated exposure control, adjustment of the mA and/or kV according to patient size and/or use of iterative reconstruction technique. COMPARISON:  None Available. FINDINGS: CT HEAD FINDINGS Brain: The ventricles and sulci are appropriate size for the patient's age. The gray-white matter discrimination is preserved. There is no acute intracranial hemorrhage. No mass effect or midline shift. No extra-axial fluid collection. Vascular: No hyperdense vessel or unexpected calcification. Skull: Normal.  Negative for fracture or focal lesion. Sinuses/Orbits: There is mucoperiosteal thickening of paranasal sinuses with opacification of the right maxillary sinus. The mastoid air cells are clear. Other: None CT CERVICAL SPINE FINDINGS Alignment: No acute subluxation. Skull base and vertebrae: No acute fracture. Soft tissues and spinal canal: No prevertebral fluid or swelling. No visible canal hematoma. Disc levels:  No acute findings.  Degenerative changes. Upper chest: Negative. Other: Mild bilateral carotid bulb calcified plaques. IMPRESSION: 1. No acute intracranial pathology. 2. No acute/traumatic cervical spine pathology. 3. Paranasal sinus disease. Electronically Signed   By: Elgie Collard M.D.   On: 05/10/2023 15:18   CT Cervical Spine Wo Contrast Result Date: 05/10/2023 CLINICAL DATA:  Fall and trauma to the head and neck. Dizziness. Blurry vision. Headache. EXAM: CT HEAD WITHOUT CONTRAST CT CERVICAL SPINE WITHOUT CONTRAST TECHNIQUE: Multidetector CT imaging of the head and cervical spine was performed following the standard protocol without intravenous contrast. Multiplanar CT image reconstructions of the cervical spine were also generated. RADIATION DOSE REDUCTION: This exam was performed according to the departmental  dose-optimization program which includes automated exposure control, adjustment of the mA and/or kV according to patient size and/or use of iterative reconstruction technique. COMPARISON:  None Available. FINDINGS: CT HEAD FINDINGS Brain: The ventricles and sulci are appropriate size for the patient's age. The gray-white matter discrimination is preserved. There is no acute intracranial hemorrhage. No mass effect or midline shift. No extra-axial fluid collection. Vascular: No hyperdense vessel or unexpected calcification. Skull: Normal. Negative for fracture or focal lesion. Sinuses/Orbits: There is mucoperiosteal thickening of paranasal sinuses with opacification of the right maxillary sinus. The mastoid air cells are clear. Other: None CT CERVICAL SPINE FINDINGS Alignment: No acute subluxation. Skull base and vertebrae: No acute fracture. Soft tissues and spinal canal: No prevertebral fluid or swelling. No visible canal hematoma. Disc levels:  No acute findings.  Degenerative changes. Upper chest: Negative. Other: Mild bilateral carotid bulb calcified plaques. IMPRESSION: 1. No acute intracranial pathology. 2. No acute/traumatic cervical spine pathology. 3. Paranasal sinus disease. Electronically Signed   By: Elgie Collard M.D.   On: 05/10/2023 15:18    Procedures Procedures    Medications Ordered in ED Medications  acetaminophen (TYLENOL) tablet 650 mg (has no administration in time range)    ED Course/ Medical Decision Making/ A&P                                 Medical Decision Making  This patient is a 78 y.o. male  who presents to the ED for concern of head injury.   Differential diagnoses prior to evaluation: The emergent differential diagnosis includes, but is not limited to,  epidural hematoma, subdural hematoma, skull fracture, subarachnoid hemorrhage, unstable cervical spine fracture, concussion vs other MSK injury . This is not an exhaustive differential.   Past Medical History  / Co-morbidities / Social History: hypertension, diabetes, coronary artery disease, GERD Who does not take any blood thinners  Physical Exam: Physical exam performed. The pertinent findings include: Mild tenderness to palpation cervical paraspinous muscles on the right, right shoulder with no step-off, deformity.  Intact crossarm adduction on the right, and left.  Intact strength 5/5 of bilateral upper and lower extremities.  Normal sensation throughout.  Cranial nerves II through XII grossly intact.  Intact finger-nose, intact heel-to-shin.  Romberg negative, gait normal.  Alert and oriented x3.  Moves all 4 limbs spontaneously, normal coordination.  No pronator drift.  Intact strength 5 out of 5 bilateral upper and lower extremities.   Somewhat hypertensive in the ED, blood pressure 161/68 max.  No chest pain, no focal neurologic deficits, no shortness of breath.  Lab Tests/Imaging studies: I personally interpreted labs/imaging and the pertinent results include:  ct head and c spine wo contrast which showed no evidence of acute intracranial abnormality, paranasal sinus disease noted. I agree with the radiologist interpretation.   Medications: We discussed migraine cocktail versus Tylenol, ibuprofen, rest for likely minor concussion with no other evidence of traumatic injury.  He had a reassuring story of mechanical, nonsyncopal fall.   Disposition: After consideration of the diagnostic results and the patients response to treatment, I feel that patient is stable for discharge, mild congestion noted. Otherwise unremarkable, non traumatic exam. No laceration or abrasion noted on head.   emergency department workup does not suggest an emergent condition requiring admission or immediate intervention beyond what has been performed at this time. The plan is: as above. The patient is safe for discharge and has been instructed to return immediately for worsening symptoms, change in symptoms or any other  concerns.  Final Clinical Impression(s) / ED Diagnoses Final diagnoses:  Injury of head, initial encounter  Concussion without loss of consciousness, initial encounter    Rx / DC Orders ED Discharge Orders     None         [place] 05/10/23 1847    Glendora Score, MD 05/11/23 1208

## 2023-05-10 NOTE — ED Notes (Signed)
Nurse informed of abnormal bp. Nurse verbalized understanding.

## 2023-05-10 NOTE — ED Provider Triage Note (Signed)
Emergency Medicine Provider Triage Evaluation Note  [name] , a 78 y.o. male  was evaluated in triage.  Pt complains of fall.  Reports that he fell and hit his head around 1130 today.  Denies loss of consciousness or blood thinner use.  He does endorse right-sided headache and blurry vision since onset of symptoms.  No weakness, confusion, or slurred speech.  Review of Systems  Positive: Headache, fall Negative: Losey, blood thinner use  Physical Exam  BP (!) 144/56 (BP Location: Left Arm)   Pulse 66   Temp 97.9 F (36.6 C) (Oral)   Resp 18   Ht 6\' 2"  (1.88 m)   Wt 97.5 kg   SpO2 98%   BMI 27.60 kg/m  Gen:   Awake, no distress   Resp:  Normal effort  MSK:   Moves extremities without difficulty  Other:    Medical Decision Making  Medically screening exam initiated at 1:50 PM.  Appropriate orders placed.  BO TEICHER was informed that the remainder of the evaluation will be completed by another provider, this initial triage assessment does not replace that evaluation, and the importance of remaining in the ED until their evaluation is complete.   Maxwell Marion, PA-C 05/10/23 1350

## 2023-05-11 ENCOUNTER — Other Ambulatory Visit: Payer: Self-pay | Admitting: Family Medicine

## 2023-05-16 ENCOUNTER — Ambulatory Visit: Payer: PPO | Admitting: Infectious Disease

## 2023-05-17 ENCOUNTER — Ambulatory Visit (INDEPENDENT_AMBULATORY_CARE_PROVIDER_SITE_OTHER): Payer: PPO | Admitting: Podiatry

## 2023-05-17 ENCOUNTER — Encounter: Payer: Self-pay | Admitting: Podiatry

## 2023-05-17 DIAGNOSIS — M79675 Pain in left toe(s): Secondary | ICD-10-CM

## 2023-05-17 DIAGNOSIS — B351 Tinea unguium: Secondary | ICD-10-CM

## 2023-05-17 DIAGNOSIS — M79674 Pain in right toe(s): Secondary | ICD-10-CM

## 2023-05-17 NOTE — Telephone Encounter (Signed)
 PAP: Application for Anoro Ellipta and Jardiance  has been submitted to [employer]), via fax

## 2023-05-17 NOTE — Progress Notes (Signed)
 Subjective:   Patient ID: [name], male   DOB: 78 y.o.   MRN: [id number]   HPI Patient presents with caregiver with severe thickening nailbeds 1-5 both feet painful when pressed inability for him to cut   ROS      Objective:  Physical Exam  Neurovascular status intact thick yellow crumbling nailbeds 1-5 both feet painful with incurvation of the borders     Assessment:  Mycotic nail infection with pain 1-5 both feet     Plan:  Debridement nailbeds 1-5 both feet nitrogen to bleeding reappoint routine care

## 2023-05-21 ENCOUNTER — Emergency Department (HOSPITAL_BASED_OUTPATIENT_CLINIC_OR_DEPARTMENT_OTHER): Payer: PPO

## 2023-05-21 ENCOUNTER — Other Ambulatory Visit: Payer: Self-pay | Admitting: "Endocrinology

## 2023-05-21 ENCOUNTER — Encounter (HOSPITAL_BASED_OUTPATIENT_CLINIC_OR_DEPARTMENT_OTHER): Payer: Self-pay | Admitting: Emergency Medicine

## 2023-05-21 ENCOUNTER — Emergency Department (HOSPITAL_BASED_OUTPATIENT_CLINIC_OR_DEPARTMENT_OTHER)
Admission: EM | Admit: 2023-05-21 | Discharge: 2023-05-21 | Disposition: A | Discharge status: Home / Self Care | Payer: PPO | Attending: Emergency Medicine | Admitting: Emergency Medicine

## 2023-05-21 ENCOUNTER — Other Ambulatory Visit: Payer: Self-pay

## 2023-05-21 DIAGNOSIS — E038 Other specified hypothyroidism: Secondary | ICD-10-CM

## 2023-05-21 DIAGNOSIS — Z79899 Other long term (current) drug therapy: Secondary | ICD-10-CM | POA: Diagnosis not present

## 2023-05-21 DIAGNOSIS — U071 COVID-19: Secondary | ICD-10-CM | POA: Diagnosis not present

## 2023-05-21 DIAGNOSIS — Z7982 Long term (current) use of aspirin: Secondary | ICD-10-CM | POA: Diagnosis not present

## 2023-05-21 DIAGNOSIS — I2510 Atherosclerotic heart disease of native coronary artery without angina pectoris: Secondary | ICD-10-CM | POA: Insufficient documentation

## 2023-05-21 DIAGNOSIS — I10 Essential (primary) hypertension: Secondary | ICD-10-CM | POA: Diagnosis not present

## 2023-05-21 DIAGNOSIS — R059 Cough, unspecified: Secondary | ICD-10-CM | POA: Diagnosis not present

## 2023-05-21 LAB — RESP PANEL BY RT-PCR (RSV, FLU A&B, COVID)  RVPGX2
Influenza A by PCR: NEGATIVE
Influenza B by PCR: NEGATIVE
Resp Syncytial Virus by PCR: NEGATIVE
SARS Coronavirus 2 by RT PCR: POSITIVE — AB

## 2023-05-21 MED ORDER — DEXAMETHASONE 4 MG PO TABS
10.0000 mg | ORAL_TABLET | Freq: Once | ORAL | Status: AC
  Administered 2023-05-21: 10 mg via ORAL
  Filled 2023-05-21: qty 3

## 2023-05-21 NOTE — ED Provider Notes (Signed)
 [place] EMERGENCY DEPARTMENT AT [facility] Provider Note   CSN: [id number] Arrival date & time: 05/21/23  9148     History  Chief Complaint  Patient presents with   Cough    JOASH TONY is a 78 y.o. male.  Flulike symptoms last couple days of cough runny nose weakness dizziness.  Denies any shortness of breath fever chest pain at this time.  Nothing makes it worse or better.No sick contacts, history of hypertension arthritis CAD.  Denies any nausea vomiting diarrhea.  His biggest issue today is just ongoing sinus congestion.  The history is provided by the patient.       Home Medications Prior to Admission medications   Medication Sig Start Date End Date Taking? Authorizing Provider  albuterol  (VENTOLIN  HFA) 108 (90 Base) MCG/ACT inhaler Inhale 2 puffs into the lungs every 6 (six) hours as needed for wheezing or shortness of breath. 06/09/21   Alvia Bring, DO  allopurinol  (ZYLOPRIM ) 100 MG tablet TAKE 1/2 TABLET BY MOUTH DAILY 03/20/23   Matthews, Cody, DO  amiodarone  (PACERONE ) 200 MG tablet Take 200 mg by mouth daily. Dr. Levern    [provider]  amLODipine  (NORVASC ) 10 MG tablet TAKE 1 TABLET BY MOUTH EVERY DAY 02/15/23   Matthews, Cody, DO  aspirin  EC 81 MG tablet Take 81 mg by mouth daily. Swallow whole.    [provider]  chlorthalidone (HYGROTON) 25 MG tablet Take 25 mg by mouth 3 (three) times a week. 03/15/23   [provider]  colchicine  0.6 MG tablet Take 1 tablet (0.6 mg total) by mouth daily. Please bill discount card vs insurance- BIN [phone], PCN [employer], Group [id number], Member ID [id number] 05/09/23   Alvia Bring, DO  Continuous Glucose Sensor (FREESTYLE LIBRE 3 SENSOR) MISC USE 1 AS DIRECTED EVERY 14 DAYS TO CHECK GLUCOSE CONTINUOUSLY 05/11/23   Alvia Bring, DO  docusate sodium  (COLACE) 100 MG capsule Take 100 mg by mouth daily as needed for mild constipation.    [provider]  empagliflozin  (JARDIANCE ) 25 MG TABS  tablet Take 1 tablet (25 mg total) by mouth daily before breakfast. 11/24/22   Motwani, Obadiah, MD  ferrous sulfate  325 (65 FE) MG tablet Take 325 mg by mouth daily with breakfast.    [provider]  furosemide  (LASIX ) 20 MG tablet Take 1 tablet (20 mg total) by mouth daily. 01/17/23 01/12/24  Vannie Reche RAMAN, NP  hydrALAZINE  (APRESOLINE ) 100 MG tablet Take 1 tablet (100 mg total) by mouth 3 (three) times daily. 01/17/23   Vannie Reche RAMAN, NP  levofloxacin  (LEVAQUIN ) 500 MG tablet Take 1 tablet by mouth daily. 11/08/22   Fleeta Kathie Jomarie LOISE, MD  levothyroxine  (SYNTHROID ) 75 MCG tablet TAKE 1 TABLET BY MOUTH EVERY DAY 02/22/23   Motwani, Komal, MD  metoprolol  succinate (TOPROL -XL) 50 MG 24 hr tablet Take 50 mg by mouth daily. 08/09/20   [provider]  omeprazole  (PRILOSEC) 40 MG capsule TAKE 1 CAPSULE BY MOUTH EVERY MORNING AND AT BEDTIME 12/23/22   Alvia Bring, DO  rosuvastatin  (CRESTOR ) 40 MG tablet Take 40 mg by mouth daily. 12/10/22   [provider]  silver  sulfADIAZINE  (SILVADENE ) 1 % cream Apply 1 Application topically daily. 10/31/22   McCaughan, Dia D, [name]  spironolactone  (ALDACTONE ) 25 MG tablet Take 0.5 tablets (12.5 mg total) by mouth daily. 01/17/23   Walker, Caitlin S, NP      Allergies    Shellfish allergy    Review of Systems  Review of Systems  Physical Exam Updated Vital Signs BP (!) 143/65 (BP Location: Left Arm)   Pulse 62   Temp 98.7 F (37.1 C) (Oral)   Resp 18   Ht 6' 2 (1.88 m)   Wt 97.5 kg   SpO2 99%   BMI 27.60 kg/m  Physical Exam Vitals and nursing note reviewed.  Constitutional:      General: He is not in acute distress.    Appearance: He is well-developed. He is not ill-appearing.  HENT:     Head: Normocephalic and atraumatic.     Nose: Nose normal.     Mouth/Throat:     Mouth: Mucous membranes are moist.  Eyes:     Extraocular Movements: Extraocular movements intact.     Conjunctiva/sclera: Conjunctivae normal.      Pupils: Pupils are equal, round, and reactive to light.  Cardiovascular:     Rate and Rhythm: Normal rate and regular rhythm.     Pulses: Normal pulses.     Heart sounds: Normal heart sounds. No murmur heard. Pulmonary:     Effort: Pulmonary effort is normal. No respiratory distress.     Breath sounds: Normal breath sounds.  Abdominal:     Palpations: Abdomen is soft.     Tenderness: There is no abdominal tenderness.  Musculoskeletal:        General: No swelling.     Cervical back: Normal range of motion and neck supple.  Skin:    General: Skin is warm and dry.     Capillary Refill: Capillary refill takes less than 2 seconds.  Neurological:     General: No focal deficit present.     Mental Status: He is alert and oriented to person, place, and time.     Cranial Nerves: No cranial nerve deficit.     Sensory: No sensory deficit.     Motor: No weakness.     Coordination: Coordination normal.  Psychiatric:        Mood and Affect: Mood normal.     ED Results / Procedures / Treatments   Labs (all labs ordered are listed, but only abnormal results are displayed) Labs Reviewed  RESP PANEL BY RT-PCR (RSV, FLU A&B, COVID)  [id number] - Abnormal; Notable for the following components:      Result Value   SARS Coronavirus 2 by RT PCR POSITIVE (*)    All other components within normal limits    EKG None  Radiology DG Chest Portable 1 View Result Date: 05/21/2023 CLINICAL DATA:  Cough. EXAM: PORTABLE CHEST 1 VIEW COMPARISON:  12/03/2021 FINDINGS: The lungs are clear without focal pneumonia, edema, pneumothorax or pleural effusion. Cardiopericardial silhouette is at upper limits of normal for size. No acute bony abnormality. IMPRESSION: No active disease. Electronically Signed   By: Camellia Candle M.D.   On: 05/21/2023 09:48    Procedures Procedures    Medications Ordered in ED Medications  dexamethasone  (DECADRON ) tablet 10 mg (has no administration in time range)    ED Course/  Medical Decision Making/ A&P                                 Medical Decision Making Amount and/or Complexity of Data Reviewed Radiology: ordered.  Risk Prescription drug management.   KENDRICKS REAP is here with sinus congestion flulike symptoms.  Normal vitals.  No fever.  Very well-appearing.  Neurologically is intact.  He has had  some cough sinus congestion and weakness lightheadedness at times for the last 5 days.  No nausea vomiting diarrhea.  Feeling better now but still with some back congestion.  Patient likely with COVID flu RSV we will get chest x-ray for pneumonia evaluation.  However I have no concern for stroke ACS PE or other acute process.  He is well-appearing.  Will give him a dose of Decadron  for symptomatic relief.  Per my review and interpretation labs patient is positive for COVID.  No pneumonia or pneumothorax seen on chest x-ray.  Discharged in good condition.  Understands return precautions.  This chart was dictated using voice recognition software.  Despite best efforts to proofread,  errors can occur which can change the documentation meaning.         Final Clinical Impression(s) / ED Diagnoses Final diagnoses:  COVID-19    Rx / DC Orders ED Discharge Orders     None         Ruthe Cornet, DO 05/21/23 1112

## 2023-05-21 NOTE — ED Notes (Signed)
 Reviewed AVS/discharge instruction with patient. Time allotted for and all questions answered. Patient is agreeable for d/c and escorted to ed exit by staff.

## 2023-05-21 NOTE — ED Triage Notes (Signed)
 C/o cough, burning in nose, and dizziness "for few minutes after waking up" since Wednesday. Denies fevers, SHOB or CP.

## 2023-05-22 NOTE — Telephone Encounter (Signed)
 Received fax from [facility].  They need patients proof of income.  Called and spoke with patient and he will bring to the office.

## 2023-05-23 ENCOUNTER — Ambulatory Visit: Payer: PPO | Admitting: Infectious Disease

## 2023-06-01 ENCOUNTER — Ambulatory Visit: Payer: PPO | Admitting: "Endocrinology

## 2023-06-04 ENCOUNTER — Other Ambulatory Visit: Payer: Self-pay | Admitting: Family Medicine

## 2023-06-06 ENCOUNTER — Other Ambulatory Visit: Payer: Self-pay

## 2023-06-06 ENCOUNTER — Encounter (HOSPITAL_BASED_OUTPATIENT_CLINIC_OR_DEPARTMENT_OTHER): Payer: Self-pay

## 2023-06-06 ENCOUNTER — Emergency Department (HOSPITAL_BASED_OUTPATIENT_CLINIC_OR_DEPARTMENT_OTHER)
Admission: EM | Admit: 2023-06-06 | Discharge: 2023-06-06 | Disposition: A | Discharge status: Home / Self Care | Payer: PPO | Attending: Emergency Medicine | Admitting: Emergency Medicine

## 2023-06-06 ENCOUNTER — Emergency Department (HOSPITAL_BASED_OUTPATIENT_CLINIC_OR_DEPARTMENT_OTHER): Payer: PPO

## 2023-06-06 DIAGNOSIS — Z87891 Personal history of nicotine dependence: Secondary | ICD-10-CM | POA: Insufficient documentation

## 2023-06-06 DIAGNOSIS — I2510 Atherosclerotic heart disease of native coronary artery without angina pectoris: Secondary | ICD-10-CM | POA: Diagnosis not present

## 2023-06-06 DIAGNOSIS — E1122 Type 2 diabetes mellitus with diabetic chronic kidney disease: Secondary | ICD-10-CM | POA: Diagnosis not present

## 2023-06-06 DIAGNOSIS — W228XXA Striking against or struck by other objects, initial encounter: Secondary | ICD-10-CM | POA: Insufficient documentation

## 2023-06-06 DIAGNOSIS — I129 Hypertensive chronic kidney disease with stage 1 through stage 4 chronic kidney disease, or unspecified chronic kidney disease: Secondary | ICD-10-CM | POA: Insufficient documentation

## 2023-06-06 DIAGNOSIS — S0990XA Unspecified injury of head, initial encounter: Secondary | ICD-10-CM | POA: Insufficient documentation

## 2023-06-06 DIAGNOSIS — N189 Chronic kidney disease, unspecified: Secondary | ICD-10-CM | POA: Diagnosis not present

## 2023-06-06 DIAGNOSIS — J45909 Unspecified asthma, uncomplicated: Secondary | ICD-10-CM | POA: Insufficient documentation

## 2023-06-06 MED ORDER — NAPROXEN 250 MG PO TABS
500.0000 mg | ORAL_TABLET | Freq: Once | ORAL | Status: AC
  Administered 2023-06-06: 500 mg via ORAL
  Filled 2023-06-06: qty 2

## 2023-06-06 NOTE — ED Provider Notes (Signed)
 [facility] Emergency Department Provider Note MRN:  [id number]  Arrival date & time: 06/06/23     Chief Complaint   Head Injury   History of Present Illness   [name] is a 78 y.o. year-old male with a history of A-fib, CKD presenting to the ED with chief complaint of head injury.  Hit head on a piece of work equipment yesterday.  No loss consciousness, does not use blood thinners.  Continued soreness to the left side of the head and left lateral neck, here for evaluation.  Review of Systems  A thorough review of systems was obtained and all systems are negative except as noted in the HPI and PMH.   Patient's Health History    Past Medical History:  Diagnosis Date   Arthritis    Asthma    no inhaler   Atrial fibrillation (HCC) 10/16/2020   CKD (chronic kidney disease) 04/13/2020   Coronary artery disease    cardiologist-  dr spruill; last visit 3 mos ago per pt   Enterobacter sepsis (HCC) 04/13/2020   GERD (gastroesophageal reflux disease)    History of MI (myocardial infarction)    1985   Hydronephrosis, left    Hypertension    Myocardial infarction (HCC)    Nephrolithiasis    left   Neuromuscular disorder (HCC)    TINGLING IN BOTH HANDS   Presence of tooth-root and mandibular implants    lower dental implants   Prostate cancer (HCC)    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   QT prolongation 10/16/2020   Shortness of breath    WITH EXERTION   Thigh abscess 04/13/2020   Type 2 diabetes mellitus (HCC)    Vaccine counseling 04/28/2022   Zoster 09/13/2021    Past Surgical History:  Procedure Laterality Date   BUNIONECTOMY     CYSTOSCOPY W/ RETROGRADES Left 03/14/2014   Procedure: CYSTOSCOPY WITH LEFT RETROGRADE PYELOGRAM, Left Ureteroscopy, [place] ureteral Stent No string;  Surgeon: Danae Chen, MD;  Location: [facility] [place];  Service: Urology;  Laterality: Left;   CYSTOSCOPY W/ URETERAL STENT PLACEMENT Bilateral  01/15/2020   Procedure: CYSTOSCOPY WITH RETROGRADE PYELOGRAM/URETERAL STENT PLACEMENT;  Surgeon: Sebastian Ache, MD;  Location: [facility];  Service: Urology;  Laterality: Bilateral;   CYSTOSCOPY/URETEROSCOPY/HOLMIUM LASER/STENT PLACEMENT Bilateral 03/17/2020   Procedure: CYSTOSCOPY/URETEROSCOPY/HOLMIUM LASER/STENT LEFT PLACEMENT;  Surgeon: Jerilee Field, MD;  Location: [facility];  Service: Urology;  Laterality: Bilateral;   HERNIA REPAIR     INCISION AND DRAINAGE Left 03/09/2020   Procedure: INCISION AND DRAINAGE LEFT HIP WITH LINER EXCHANGE;  Surgeon: Ollen Gross, MD;  Location: [facility];  Service: Orthopedics;  Laterality: Left;   IR FLUORO GUIDE CV LINE RIGHT  03/10/2020   IR RADIOLOGIST EVAL & MGMT  04/01/2020   IR RADIOLOGIST EVAL & MGMT  04/15/2020   IR RADIOLOGIST EVAL & MGMT  04/30/2020   IR REMOVAL TUN CV CATH W/O FL  04/22/2020   IR US GUIDE BX ASP/DRAIN  03/17/2020   IR US GUIDE VASC ACCESS RIGHT  03/10/2020   PROSTATE BIOPSY     TOTAL HIP ARTHROPLASTY Left 03-20-2009   TOTAL HIP ARTHROPLASTY  04/09/2012   Procedure: TOTAL HIP ARTHROPLASTY;  Surgeon: Loanne Drilling, MD;  Location: [facility];  Service: Orthopedics;  Laterality: Right;   TOTAL KNEE ARTHROPLASTY Left 05-16-2008    Family History  Problem Relation Age of Onset   Cancer Mother        breast   Multiple  sclerosis Daughter    Cancer Father        prostate   Diabetes Father    Cancer Maternal Uncle        bone    Social History   Socioeconomic History   Marital status: Widowed    Spouse name: Not on file   Number of children: 5   Years of education: 9   Highest education level: 9th grade  Occupational History   Occupation: Retired  Tobacco Use   Smoking status: Former    Current packs/day: 0.00    Average packs/day: 1 pack/day for 25.0 years (25.0 ttl pk-yrs)    Types: Cigarettes    Start date: 04/12/1959    Quit date: 04/11/1984    Years since quitting: 39.1   Smokeless tobacco: Former    Quit date: 04/02/1985   Vaping Use   Vaping status: Never Used  Substance and Sexual Activity   Alcohol use: Not Currently    Comment: RARE   Drug use: No   Sexual activity: Not Currently  Other Topics Concern   Not on file  Social History Narrative   Lives alone. He had five children and one has deceased. He enjoys watching television.   Social Drivers of Health   Financial Resource Strain: Medium Risk (09/29/2022)   Overall Financial Resource Strain (CARDIA)    Difficulty of Paying Living Expenses: Somewhat hard  Food Insecurity: No Food Insecurity (09/27/2022)   Hunger Vital Sign    Worried About Running Out of Food in the Last Year: Never true    Ran Out of Food in the Last Year: Never true  Transportation Needs: No Transportation Needs (09/27/2022)   PRAPARE - Administrator, Civil Service (Medical): No    Lack of Transportation (Non-Medical): No  Physical Activity: Sufficiently Active (09/27/2022)   Exercise Vital Sign    Days of Exercise per Week: 3 days    Minutes of Exercise per Session: 60 min  Stress: No Stress Concern Present (09/27/2022)   [employer] of Occupational Health - Occupational Stress Questionnaire    Feeling of Stress : Not at all  Social Connections: Moderately Isolated (09/27/2022)   Social Connection and Isolation Panel [NHANES]    Frequency of Communication with Friends and Family: More than three times a week    Frequency of Social Gatherings with Friends and Family: More than three times a week    Attends Religious Services: More than 4 times per year    Active Member of [employer] or Organizations: No    Attends Banker Meetings: Never    Marital Status: Widowed  Intimate Partner Violence: Not At Risk (09/27/2022)   Humiliation, Afraid, Rape, and Kick questionnaire    Fear of Current or Ex-Partner: No    Emotionally Abused: No    Physically Abused: No    Sexually Abused: No     Physical Exam   Vitals:   06/06/23 0044 06/06/23 0400  BP:  (!) 144/71 (!) 141/64  Pulse: 67 60  Resp: 18 18  Temp: 98 F (36.7 C) 98 F (36.7 C)  SpO2: 99% 99%    CONSTITUTIONAL: Well-appearing, NAD NEURO/PSYCH:  Alert and oriented x 3, no focal deficits EYES:  eyes equal and reactive ENT/NECK:  no LAD, no JVD CARDIO: Regular rate, well-perfused, normal S1 and S2 PULM:  CTAB no wheezing or rhonchi GI/GU:  non-distended, non-tender MSK/SPINE:  No gross deformities, no edema SKIN:  no rash, atraumatic   *Additional  and/or pertinent findings included in MDM below  Diagnostic and Interventional Summary    EKG Interpretation Date/Time:    Ventricular Rate:    PR Interval:    QRS Duration:    QT Interval:    QTC Calculation:   R Axis:      Text Interpretation:         Labs Reviewed - No data to display  CT Head Wo Contrast  Final Result      Medications  naproxen (NAPROSYN) tablet 500 mg (has no administration in time range)     Procedures  /  Critical Care Procedures  ED Course and Medical Decision Making  Initial Impression and Ddx Head trauma without obvious external signs of trauma on exam, normal neurological exam, does not take blood thinners, normal vital signs.  Patient is here out of abundance of caution.  Overall doubt emergent process.  Past medical/surgical history that increases complexity of ED encounter: None  Interpretation of Diagnostics CT head obtained in triage was unremarkable.  Patient Reassessment and Ultimate Disposition/Management     Discharge  Patient management required discussion with the following services or consulting groups:  None  Complexity of Problems Addressed Acute illness or injury that poses threat of life of bodily function  Additional Data Reviewed and Analyzed Further history obtained from: None  Additional Factors Impacting ED Encounter Risk None  Elmer Sow. Pilar Plate, MD [facility] Emergency Medicine [facility] [email or web address]  Final  Clinical Impressions(s) / ED Diagnoses     ICD-10-CM   1. Traumatic injury of head, initial encounter  S09.90XA       ED Discharge Orders     None        Discharge Instructions Discussed with and Provided to Patient:   Discharge Instructions      You were evaluated in the Emergency Department and after careful evaluation, we did not find any emergent condition requiring admission or further testing in the hospital.  Your exam/testing today is overall reassuring.  CT scan did not show any significant injuries.  Recommend Tylenol and Motrin at home for any lingering discomfort.  Please return to the Emergency Department if you experience any worsening of your condition.   Thank you for allowing [place] to be a part of your care.      Sabas Sous, MD 06/06/23 [phone]

## 2023-06-06 NOTE — Progress Notes (Unsigned)
 Subjective:  Chief complaint: Follow-up for prosthetic hip infection   Patient ID: [name], male    DOB: 1945/09/22, 78 y.o.   MRN: [id number]  HPI  78 year old Black man with PMHx of CAD, DM2, HTN, CKD, OA, s/p left hip arthoplasty (2001), prostate cancer, PAF, left ureteral stone s/p stenting (01/2020) who was sent from ortho clinic on 11/23 by Dr Lequita Halt. He grew Enterobacter from blood, urine and hip aspirate. He is sp surgery with Irrigation and debridement, left hip, with bearing surface exchange. Enterobacter grew on cultures.  We had him scheduled to complete antibiotics on 10 January.  In the interim he developed a fluid collection in his thigh that was drained and seemed rather bloody but which also grew Enterobacter from culture.   He finished cefepime and we started bactrim DS BID he ran into problems with hyperkalemia and elevated creatinine also in the context of other potassium sparing and potassium raising medications  '  Interim narrative:   We obtained [name] though due to his deductible not having yet been met he would have to pay roughly $2k upfront.   He ended up doing that but then turned out that he need to pay $640 a month even though he had met that amount.   I then took [place] off he [name] because clinically seem d to be doing well and his CRP had normalized though his sed rate had not normalized I did come down from 140 into the 90s.  I also felt like continue him on this tetracycline would just be to cost prohibitive.  Unfortunately since he came off antibiotics he experienced recurrence of infection at his hip with increasing pain and also pus which began coming through the skin.  He was seen by Dr.  Despina Hick  who aspirated the area and sent for culture. Exact same Enterobacter grew yet again from culture in June of 2022.  After careful consideration we decided to start levofloxacin despite risk of C. difficile colitis, Achilles tendinopathy  confusion problems with blood sugar control and QT prolongation in the context of him being on amiodarone and having slight QT prolongation at baseline.  Since starting levofloxacin he to be having no adverse effects from this.  He returned to clinic and we obtained a twelve-lead EKG.  If EKG shows normal sinus rhythm with heart first-degree AV block but no significant ST or T wave changes and a QT and QTc of 429 and 451 which was largely unchanged from his last EKG obtained.  1 time when I saw [name] is having some pruritus that he did to the levofloxacin.  He continues to have this but he is able to tolerate it.  I was told by triage staff that his levofloxacin had been discontinued possibly by Dr. Everardo All but I do not see mention of this in his note indeed he was not off levofloxacin.  His pain has been stable and seems to have been improved.  He did have a flare of zoster recently which caused him to have pain in his left lower extremity and also up in his hip.  This is improved however.  He does still have itching that is worse when he goes to bed or when he is "winding down.  He previously very much would like to come off the antibiotics would like to push through 1 year from when it recurred which would be in July.  Returns to clinic now in July a year later.  We had further discussions about continuing antibiotics versus stopping them I think in my opinion it be prudent to continue him on them for now and revisit this in 6 months because I have a high suspicion he will have recurrence once he comes off.  He appears to be doing relatively well today his hip pain is stable he really only has it when he sits in his recliner and sounds when he gets up in the middle the night to go to the bathroom.  He had a right-sided knee effusion that continues to bother him but has responded to treatment with aspiration and injection of corticosteroids with Dr. Despina Hick.  He did have an episode of  transient confusion when he was driving his car and lost his way in [place] but I do not think this is related to levofloxacin because it was transient"   Discussed the use of AI scribe software for clinical note transcription with the patient, who gave verbal consent to proceed.  History of Present Illness   The patient, with a history of prosthetic hip infection, presents after two recent falls. The first fall occurred at [facility] where he tripped over a piece of steel and hit his head. [facility] and fell backwards off a bench, hitting his arm. He denies any significant injuries from these falls and reports that scans showed no fractures. He was in the ER yesterday and had Xray hip, CT head which were unremarkable.  He also reports a lump on his arm that has been present for a while. It was initially the size of a pea and has grown slightly. Two doctors have examined it and suggested it might be a "growth" but no definitive diagnosis or treatment plan has been proposed.  The patient's hip infection has been managed with long-term levofloxacin, which he reports has caused dark spots on his skin. His last flare of the infection, which was managed with fluid drainage and resumptioins of antibiotics occurred 2 years ago.Marland Kitchen He continues to see his orthopedic surgeon, Dr. Despina Hick for follow-up but is largely seeing him for OA of knee and has had fluid removed from time to time for that reason he says. He also does have diagnosis of gout.            Past Medical History:  Diagnosis Date   Arthritis    Asthma    no inhaler   Atrial fibrillation (HCC) 10/16/2020   CKD (chronic kidney disease) 04/13/2020   Coronary artery disease    cardiologist-  dr spruill; last visit 3 mos ago per pt   Enterobacter sepsis (HCC) 04/13/2020   GERD (gastroesophageal reflux disease)    History of MI (myocardial infarction)    1985   Hydronephrosis, left     Hypertension    Myocardial infarction (HCC)    Nephrolithiasis    left   Neuromuscular disorder (HCC)    TINGLING IN BOTH HANDS   Presence of tooth-root and mandibular implants    lower dental implants   Prostate cancer (HCC)    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   QT prolongation 10/16/2020   Shortness of breath    WITH EXERTION   Thigh abscess 04/13/2020   Type 2 diabetes mellitus (HCC)    Vaccine counseling 04/28/2022   Zoster 09/13/2021    Past Surgical History:  Procedure Laterality Date   BUNIONECTOMY     CYSTOSCOPY W/ RETROGRADES Left 03/14/2014  Procedure: CYSTOSCOPY WITH LEFT RETROGRADE PYELOGRAM, Left Ureteroscopy, [place] ureteral Stent No string;  Surgeon: Danae Chen, MD;  Location: [facility];  Service: Urology;  Laterality: Left;   CYSTOSCOPY W/ URETERAL STENT PLACEMENT Bilateral 01/15/2020   Procedure: CYSTOSCOPY WITH RETROGRADE PYELOGRAM/URETERAL STENT PLACEMENT;  Surgeon: Sebastian Ache, MD;  Location: [facility];  Service: Urology;  Laterality: Bilateral;   CYSTOSCOPY/URETEROSCOPY/HOLMIUM LASER/STENT PLACEMENT Bilateral 03/17/2020   Procedure: CYSTOSCOPY/URETEROSCOPY/HOLMIUM LASER/STENT LEFT PLACEMENT;  Surgeon: Jerilee Field, MD;  Location: [facility];  Service: Urology;  Laterality: Bilateral;   HERNIA REPAIR     INCISION AND DRAINAGE Left 03/09/2020   Procedure: INCISION AND DRAINAGE LEFT HIP WITH LINER EXCHANGE;  Surgeon: Ollen Gross, MD;  Location: [facility];  Service: Orthopedics;  Laterality: Left;   IR FLUORO GUIDE CV LINE RIGHT  03/10/2020   IR RADIOLOGIST EVAL & MGMT  04/01/2020   IR RADIOLOGIST EVAL & MGMT  04/15/2020   IR RADIOLOGIST EVAL & MGMT  04/30/2020   IR REMOVAL TUN CV CATH W/O FL  04/22/2020   IR US GUIDE BX ASP/DRAIN  03/17/2020   IR US GUIDE VASC ACCESS RIGHT  03/10/2020   PROSTATE BIOPSY     TOTAL HIP ARTHROPLASTY Left 03-20-2009   TOTAL HIP ARTHROPLASTY  04/09/2012   Procedure: TOTAL HIP ARTHROPLASTY;  Surgeon: Loanne Drilling, MD;  Location: [facility];  Service: Orthopedics;  Laterality: Right;   TOTAL KNEE ARTHROPLASTY Left 05-16-2008    Family History  Problem Relation Age of Onset   Cancer Mother        breast   Multiple sclerosis Daughter    Cancer Father        prostate   Diabetes Father    Cancer Maternal Uncle        bone      Social History   Socioeconomic History   Marital status: Widowed    Spouse name: Not on file   Number of children: 5   Years of education: 9   Highest education level: 9th grade  Occupational History   Occupation: Retired  Tobacco Use   Smoking status: Former    Current packs/day: 0.00    Average packs/day: 1 pack/day for 25.0 years (25.0 ttl pk-yrs)    Types: Cigarettes    Start date: 04/12/1959    Quit date: 04/11/1984    Years since quitting: 39.1   Smokeless tobacco: Former    Quit date: 04/02/1985  Vaping Use   Vaping status: Never Used  Substance and Sexual Activity   Alcohol use: Not Currently    Comment: RARE   Drug use: No   Sexual activity: Not Currently  Other Topics Concern   Not on file  Social History Narrative   Lives alone. He had five children and one has deceased. He enjoys watching television.   Social Drivers of Health   Financial Resource Strain: Medium Risk (09/29/2022)   Overall Financial Resource Strain (CARDIA)    Difficulty of Paying Living Expenses: Somewhat hard  Food Insecurity: No Food Insecurity (09/27/2022)   Hunger Vital Sign    Worried About Running Out of Food in the Last Year: Never true    Ran Out of Food in the Last Year: Never true  Transportation Needs: No Transportation Needs (09/27/2022)   PRAPARE - Administrator, Civil Service (Medical): No    Lack of Transportation (Non-Medical): No  Physical Activity: Sufficiently Active (09/27/2022)   Exercise Vital Sign    Days of  Exercise per Week: 3 days    Minutes of Exercise per Session: 60 min  Stress: No Stress Concern Present (09/27/2022)   [employer] of Occupational Health - Occupational Stress Questionnaire    Feeling of Stress : Not at all  Social Connections: Moderately Isolated (09/27/2022)   Social Connection and Isolation Panel [NHANES]    Frequency of Communication with Friends and Family: More than three times a week    Frequency of Social Gatherings with Friends and Family: More than three times a week    Attends Religious Services: More than 4 times per year    Active Member of [employer] or Organizations: No    Attends Banker Meetings: Never    Marital Status: Widowed    Allergies  Allergen Reactions   Shellfish Allergy Rash     Current Outpatient Medications:    albuterol (VENTOLIN HFA) 108 (90 Base) MCG/ACT inhaler, Inhale 2 puffs into the lungs every 6 (six) hours as needed for wheezing or shortness of breath., Disp: 8 g, Rfl: 0   allopurinol (ZYLOPRIM) 100 MG tablet, TAKE 1/2 TABLET BY MOUTH DAILY, Disp: 45 tablet, Rfl: 1   amiodarone (PACERONE) 200 MG tablet, Take 200 mg by mouth daily. [name], Disp: , Rfl:    amLODipine (NORVASC) 10 MG tablet, TAKE 1 TABLET BY MOUTH EVERY DAY, Disp: 90 tablet, Rfl: 1   aspirin EC 81 MG tablet, Take 81 mg by mouth daily. Swallow whole., Disp: , Rfl:    chlorthalidone (HYGROTON) 25 MG tablet, Take 25 mg by mouth 3 (three) times a week., Disp: , Rfl:    colchicine 0.6 MG tablet, Take 1 tablet (0.6 mg total) by mouth daily. Please bill discount card vs insurance- BIN [phone], PCN [id number], Group [id number], Member ID [id number], Disp: 30 tablet, Rfl: 12   Continuous Glucose Sensor (FREESTYLE LIBRE 3 SENSOR) MISC, USE 1 AS DIRECTED EVERY 14 DAYS TO CHECK GLUCOSE CONTINUOUSLY, Disp: 2 each, Rfl: 3   docusate sodium (COLACE) 100 MG capsule, Take 100 mg by mouth daily as needed for mild constipation., Disp: , Rfl:    empagliflozin (JARDIANCE) 25 MG TABS tablet, Take 1 tablet (25 mg total) by mouth daily before breakfast., Disp: 90 tablet, Rfl: 1   ferrous sulfate 325 (65 FE) MG  tablet, Take 325 mg by mouth daily with breakfast., Disp: , Rfl:    furosemide (LASIX) 20 MG tablet, Take 1 tablet (20 mg total) by mouth daily., Disp: 90 tablet, Rfl: 3   hydrALAZINE (APRESOLINE) 100 MG tablet, Take 1 tablet (100 mg total) by mouth 3 (three) times daily., Disp: 270 tablet, Rfl: 3   levofloxacin (LEVAQUIN) 500 MG tablet, Take 1 tablet by mouth daily., Disp: 30 tablet, Rfl: 11   levothyroxine (SYNTHROID) 75 MCG tablet, TAKE 1 TABLET BY MOUTH EVERY DAY, Disp: 90 tablet, Rfl: 0   metoprolol succinate (TOPROL-XL) 50 MG 24 hr tablet, Take 50 mg by mouth daily., Disp: , Rfl:    omeprazole (PRILOSEC) 40 MG capsule, TAKE 1 CAPSULE BY MOUTH EVERY MORNING AND AT BEDTIME, Disp: 180 capsule, Rfl: 0   rosuvastatin (CRESTOR) 40 MG tablet, TAKE 1 TABLET BY MOUTH EVERY DAY, Disp: 90 tablet, Rfl: 3   silver sulfADIAZINE (SILVADENE) 1 % cream, Apply 1 Application topically daily., Disp: 50 g, Rfl: 0   spironolactone (ALDACTONE) 25 MG tablet, Take 0.5 tablets (12.5 mg total) by mouth daily., Disp: 45 tablet, Rfl: 3   Review of Systems  Constitutional:  Negative for activity  change, appetite change, chills, diaphoresis, fatigue, fever and unexpected weight change.  HENT:  Negative for congestion, rhinorrhea, sinus pressure, sneezing, sore throat and trouble swallowing.   Eyes:  Negative for photophobia and visual disturbance.  Respiratory:  Negative for cough, chest tightness, shortness of breath, wheezing and stridor.   Cardiovascular:  Negative for chest pain, palpitations and leg swelling.  Gastrointestinal:  Negative for abdominal distention, abdominal pain, anal bleeding, blood in stool, constipation, diarrhea, nausea and vomiting.  Genitourinary:  Negative for difficulty urinating, dysuria, flank pain and hematuria.  Musculoskeletal:  Positive for arthralgias and joint swelling. Negative for back pain, gait problem and myalgias.  Skin:  Negative for color change, pallor, rash and wound.   Neurological:  Negative for dizziness, tremors, weakness and light-headedness.  Hematological:  Negative for adenopathy. Does not bruise/bleed easily.  Psychiatric/Behavioral:  Negative for agitation, behavioral problems, confusion, decreased concentration, dysphoric mood and sleep disturbance.        Objective:   Physical Exam Constitutional:      Appearance: He is well-developed.  HENT:     Head: Normocephalic and atraumatic.  Eyes:     Conjunctiva/sclera: Conjunctivae normal.  Cardiovascular:     Rate and Rhythm: Normal rate and regular rhythm.  Pulmonary:     Effort: Pulmonary effort is normal. No respiratory distress.     Breath sounds: No wheezing.  Abdominal:     General: There is no distension.     Palpations: Abdomen is soft.  Musculoskeletal:        General: No tenderness. Normal range of motion.     Cervical back: Normal range of motion and neck supple.  Skin:    General: Skin is warm and dry.     Coloration: Skin is not pale.     Findings: No erythema or rash.  Neurological:     General: No focal deficit present.     Mental Status: He is alert and oriented to person, place, and time.  Psychiatric:        Mood and Affect: Mood normal.        Behavior: Behavior normal.        Thought Content: Thought content normal.        Judgment: Judgment normal.     He has what appears to be a lipoma on wrist. Seems a bit too proximal on arm to be a ganglion cyst       Assessment & Plan:     Assessment and Plan    Recurrent Falls Two recent falls, one resulting in a head injury and the other in an arm injury. No fractures or significant injuries reported. -Encourage patient to be mindful of surroundings to prevent future falls.  Arm Mass A mass on the arm that has been present for a while and has grown from the size of a pea. Differential diagnosis includes lipoma or ganglion cyst. No plans for surgical intervention discussed. -Continue monitoring the mass and  report any changes to primary care physician.  Prosthetic Hip Infection Chronic hip infection managed with long-term Levofloxacin. No recent flares reported. -Continue Levofloxacin. -Check kidney function today, CBC w diff, ESR and CRP and send results to kidney doctor and [name]. -Plan for follow-up in six months.   CKD: sending labs to his Nephrologist though we need name of MD or practice   Cardiovascular disease including atrial fibrillation on amiodarone which with levaquin does post risk of QT prolongation. will check CMP and CBC as mentioned and send  to his cardiologist  Knee Effusion Recurrent knee effusions requiring periodic drainage. -Continue current management with Dr. Antony Odea.  General Health Maintenance -Check if COVID-19 booster vaccine is available today. If not, advise patient to get it at a pharmacy.

## 2023-06-06 NOTE — ED Triage Notes (Signed)
 Pt presents via POV c/o hitting head on workout equipment yesterday am. Denies LOC. Denies anticoagulation use. Reports doesn't remember hitting head but now it is sore. Ambulatory to triage. A&O x4.

## 2023-06-06 NOTE — Discharge Instructions (Signed)
 You were evaluated in the Emergency Department and after careful evaluation, we did not find any emergent condition requiring admission or further testing in the hospital.  Your exam/testing today is overall reassuring.  CT scan did not show any significant injuries.  Recommend Tylenol and Motrin at home for any lingering discomfort.  Please return to the Emergency Department if you experience any worsening of your condition.   Thank you for allowing [place] to be a part of your care.

## 2023-06-07 ENCOUNTER — Ambulatory Visit (INDEPENDENT_AMBULATORY_CARE_PROVIDER_SITE_OTHER): Payer: PPO | Admitting: Infectious Disease

## 2023-06-07 ENCOUNTER — Other Ambulatory Visit: Payer: Self-pay

## 2023-06-07 ENCOUNTER — Encounter: Payer: Self-pay | Admitting: Infectious Disease

## 2023-06-07 VITALS — BP 133/68 | HR 66 | Ht 74.0 in | Wt 220.0 lb

## 2023-06-07 DIAGNOSIS — T8451XD Infection and inflammatory reaction due to internal right hip prosthesis, subsequent encounter: Secondary | ICD-10-CM

## 2023-06-07 DIAGNOSIS — Z96649 Presence of unspecified artificial hip joint: Secondary | ICD-10-CM | POA: Diagnosis not present

## 2023-06-07 DIAGNOSIS — E1122 Type 2 diabetes mellitus with diabetic chronic kidney disease: Secondary | ICD-10-CM | POA: Diagnosis not present

## 2023-06-07 DIAGNOSIS — W19XXXA Unspecified fall, initial encounter: Secondary | ICD-10-CM | POA: Diagnosis not present

## 2023-06-07 DIAGNOSIS — T8459XD Infection and inflammatory reaction due to other internal joint prosthesis, subsequent encounter: Secondary | ICD-10-CM | POA: Diagnosis not present

## 2023-06-07 DIAGNOSIS — N1832 Chronic kidney disease, stage 3b: Secondary | ICD-10-CM

## 2023-06-07 DIAGNOSIS — R296 Repeated falls: Secondary | ICD-10-CM

## 2023-06-07 DIAGNOSIS — A4159 Other Gram-negative sepsis: Secondary | ICD-10-CM

## 2023-06-07 DIAGNOSIS — I4891 Unspecified atrial fibrillation: Secondary | ICD-10-CM

## 2023-06-07 DIAGNOSIS — I2510 Atherosclerotic heart disease of native coronary artery without angina pectoris: Secondary | ICD-10-CM

## 2023-06-07 DIAGNOSIS — Z7185 Encounter for immunization safety counseling: Secondary | ICD-10-CM

## 2023-06-07 MED ORDER — LEVOFLOXACIN 500 MG PO TABS: 500.0000 mg | ORAL_TABLET | Freq: Every day | ORAL | 11 refills | Status: DC

## 2023-06-08 LAB — CBC WITH DIFFERENTIAL/PLATELET
Absolute Lymphocytes: 1049 {cells}/uL (ref 850–3900)
Absolute Monocytes: 527 {cells}/uL (ref 200–950)
Basophils Absolute: 12 {cells}/uL (ref 0–200)
Basophils Relative: 0.3 %
Eosinophils Absolute: 160 {cells}/uL (ref 15–500)
Eosinophils Relative: 4.1 %
HCT: 28.7 % — ABNORMAL LOW (ref 38.5–50.0)
Hemoglobin: 9.3 g/dL — ABNORMAL LOW (ref 13.2–17.1)
MCH: 32.2 pg (ref 27.0–33.0)
MCHC: 32.4 g/dL (ref 32.0–36.0)
MCV: 99.3 fL (ref 80.0–100.0)
MPV: 9.2 fL (ref 7.5–12.5)
Monocytes Relative: 13.5 %
Neutro Abs: 2153 {cells}/uL (ref 1500–7800)
Neutrophils Relative %: 55.2 %
Platelets: 255 10*3/uL (ref 140–400)
RBC: 2.89 10*6/uL — ABNORMAL LOW (ref 4.20–5.80)
RDW: 13.1 % (ref 11.0–15.0)
Total Lymphocyte: 26.9 %
WBC: 3.9 10*3/uL (ref 3.8–10.8)

## 2023-06-08 LAB — C-REACTIVE PROTEIN: CRP: <3 mg/L (ref <8.0)

## 2023-06-08 LAB — COMPLETE METABOLIC PANEL WITH GFR
AG Ratio: 1.3 (calc) (ref 1.0–2.5)
ALT: 10 U/L (ref 9–46)
AST: 12 U/L (ref 10–35)
Albumin: 3.9 g/dL (ref 3.6–5.1)
Alkaline phosphatase (APISO): 63 U/L (ref 35–144)
BUN/Creatinine Ratio: 16 (calc) (ref 6–22)
BUN: 32 mg/dL — ABNORMAL HIGH (ref 7–25)
CO2: 23 mmol/L (ref 20–32)
Calcium: 8.5 mg/dL — ABNORMAL LOW (ref 8.6–10.3)
Chloride: 111 mmol/L — ABNORMAL HIGH (ref 98–110)
Creat: 1.97 mg/dL — ABNORMAL HIGH (ref 0.70–1.28)
Globulin: 3.1 g/dL (ref 1.9–3.7)
Glucose, Bld: 138 mg/dL — ABNORMAL HIGH (ref 65–99)
Potassium: 5.1 mmol/L (ref 3.5–5.3)
Sodium: 140 mmol/L (ref 135–146)
Total Bilirubin: 0.3 mg/dL (ref 0.2–1.2)
Total Protein: 7 g/dL (ref 6.1–8.1)
eGFR: 34 mL/min/{1.73_m2} — ABNORMAL LOW (ref >=60)

## 2023-06-08 LAB — SEDIMENTATION RATE: Sed Rate: 36 mm/h — ABNORMAL HIGH (ref 0–20)

## 2023-06-09 NOTE — Telephone Encounter (Signed)
 Unable to reach patient-patient did not return his proof of income requested by [facility] for patient assistacne

## 2023-06-13 DIAGNOSIS — I10 Essential (primary) hypertension: Secondary | ICD-10-CM | POA: Diagnosis not present

## 2023-06-13 DIAGNOSIS — N1832 Chronic kidney disease, stage 3b: Secondary | ICD-10-CM | POA: Diagnosis not present

## 2023-06-14 ENCOUNTER — Telehealth: Payer: Self-pay

## 2023-06-14 NOTE — Progress Notes (Signed)
   06/14/2023  Patient ID: [name], male   DOB: [birth year], 78 y.o.   MRN: [id number]  Patient outreach to follow-up regarding [employer] PAP application for Jardiance 25mg .  Program is still needing proof of income to process application.  Patient states cardiology stopped this medication based on kidney function, and he does not wish to continue to pursue PAP.  Medication previously prescribed by Dr. Roosevelt Locks with endocrinology, so I am coordinating with provider to make them aware of this information.  Lenna Gilford, PharmD, DPLA

## 2023-06-15 DIAGNOSIS — I10 Essential (primary) hypertension: Secondary | ICD-10-CM | POA: Diagnosis not present

## 2023-06-15 DIAGNOSIS — N189 Chronic kidney disease, unspecified: Secondary | ICD-10-CM | POA: Diagnosis not present

## 2023-06-15 DIAGNOSIS — N1832 Chronic kidney disease, stage 3b: Secondary | ICD-10-CM | POA: Diagnosis not present

## 2023-06-15 DIAGNOSIS — I129 Hypertensive chronic kidney disease with stage 1 through stage 4 chronic kidney disease, or unspecified chronic kidney disease: Secondary | ICD-10-CM | POA: Diagnosis not present

## 2023-06-15 DIAGNOSIS — E1122 Type 2 diabetes mellitus with diabetic chronic kidney disease: Secondary | ICD-10-CM | POA: Diagnosis not present

## 2023-06-15 DIAGNOSIS — D631 Anemia in chronic kidney disease: Secondary | ICD-10-CM | POA: Diagnosis not present

## 2023-06-19 ENCOUNTER — Other Ambulatory Visit: Payer: Self-pay

## 2023-06-19 NOTE — Progress Notes (Signed)
   06/19/2023  Patient ID: [name], male   DOB: 10/04/1945, 78 y.o.   MRN: [id number]  Patient out reach to inform [name] that endocrinologist, Dr. Roosevelt Locks, would like patient to resume Jardiance 25 mg daily for diabetes control as well as renal protection.  Patient states that he has resumed this medication, because cardiology advised him to last week, also.  Patient previously enrolled in [employer] patient assistance program to receive this medication at no cost.  Proof of income is needed for processing of his 2025 reenrollment application.  Patient plans to bring this to me at primary care [facility] Thursday, 3/13.  [name], PharmD, DPLA

## 2023-06-20 ENCOUNTER — Ambulatory Visit: Payer: Self-pay | Admitting: Family Medicine

## 2023-06-20 ENCOUNTER — Telehealth: Payer: Self-pay

## 2023-06-20 NOTE — Telephone Encounter (Signed)
 Pt called office reporting that has blood sugar has been elevated readings between 200-350,pt is taking his jardiance 25mg  daily.

## 2023-06-20 NOTE — Telephone Encounter (Signed)
  Chief Complaint: hyperglycemia Symptoms: mild headache, mild dizziness, hyperglycemia Frequency: since last night Pertinent Negatives: Patient denies fever, increased urination, frequent urination, nausea, vomiting, difficulty breathing, blurry vision. Disposition: [] ED /[] Urgent Care (no appt availability in office) / [x] Appointment(In office/virtual)/ []  [place] [facility]/ [] Home Care/ [] Refused Recommended Disposition /[] [place] [facility]/ []  Follow-up with PCP Additional Notes: Patient states today is the first day he has gotten to 300 or higher. He states it has been running in the 200s. Patient and family member on the phone for triage. Patient states he had been off his Jardiance for a couple of weeks and on Thursday his nephrologist put him back on Jardiance and chlorthalidone. Patient given home care advice to monitor symptoms and blood glucose and verbalizes understanding to call back for new or concerning symptoms. Per patient request, scheduled him for PCP appt.  Copied from CRM [id number]. Topic: Clinical - Red Word Triage >> Jun 20, 2023  2:51 PM [name] wrote: Red Word that prompted transfer to Nurse Triage: His blood sugar started going up yesterday. Last night and this morning it was 350. He has a headache and dizzy Reason for Disposition  [1] Blood glucose > 300 mg/dL (14.7 mmol/L) AND [8] does not  use insulin (e.g., not insulin-dependent; most people with type 2 diabetes)  Answer Assessment - Initial Assessment Questions 1. BLOOD GLUCOSE: "What is your blood glucose level?"      350. 223.  2. ONSET: "When did you check the blood glucose?"     223, this afternoon. 350 last night around midnight. He states it currently 274.  3. USUAL RANGE: "What is your glucose level usually?" (e.g., usual fasting morning value, usual evening value)     He states he usually is in the 140 range.  4. KETONES: "Do you check for ketones (urine or blood test strips)?" If Yes, ask:  "What does the test show now?"      He states his doctor checked both in urine and blood.   5. TYPE 1 or 2:  "Do you know what type of diabetes you have?"  (e.g., Type 1, Type 2, Gestational; doesn't know)      Type 2.  6. INSULIN: "Do you take insulin?" "What type of insulin(s) do you use? What is the mode of delivery? (syringe, pen; injection or pump)?"      No.  7. DIABETES PILLS: "Do you take any pills for your diabetes?" If Yes, ask: "Have you missed taking any pills recently?"     Jardiance. He states he was taken off the medication for about 3 weeks.  He states he was seen by the kidney doctor on Thursday and placed back on Jardiance and chlorthalidone.   8. OTHER SYMPTOMS: "Do you have any symptoms?" (e.g., fever, frequent urination, difficulty breathing, dizziness, weakness, vomiting)     1/10 headache, mild dizziness.  Protocols used: Diabetes - High Blood Sugar-A-AH

## 2023-06-22 ENCOUNTER — Ambulatory Visit (INDEPENDENT_AMBULATORY_CARE_PROVIDER_SITE_OTHER): Admitting: "Endocrinology

## 2023-06-22 ENCOUNTER — Encounter: Payer: Self-pay | Admitting: "Endocrinology

## 2023-06-22 VITALS — BP 130/80 | HR 70 | Ht 74.0 in | Wt 213.0 lb

## 2023-06-22 DIAGNOSIS — Z7984 Long term (current) use of oral hypoglycemic drugs: Secondary | ICD-10-CM | POA: Diagnosis not present

## 2023-06-22 DIAGNOSIS — E1165 Type 2 diabetes mellitus with hyperglycemia: Secondary | ICD-10-CM

## 2023-06-22 DIAGNOSIS — E782 Mixed hyperlipidemia: Secondary | ICD-10-CM

## 2023-06-22 DIAGNOSIS — E1140 Type 2 diabetes mellitus with diabetic neuropathy, unspecified: Secondary | ICD-10-CM | POA: Diagnosis not present

## 2023-06-22 LAB — POCT GLYCOSYLATED HEMOGLOBIN (HGB A1C): Hemoglobin A1C: 7.3 % — AB (ref 4.0–5.6)

## 2023-06-22 MED ORDER — SITAGLIPTIN PHOSPHATE 50 MG PO TABS: 50.0000 mg | ORAL_TABLET | Freq: Every day | ORAL | 1 refills | Status: AC

## 2023-06-22 MED ORDER — EMPAGLIFLOZIN 25 MG PO TABS: 25.0000 mg | ORAL_TABLET | Freq: Every day | ORAL | 1 refills | Status: DC

## 2023-06-22 NOTE — Progress Notes (Signed)
 Outpatient Endocrinology Note Aaron [place], MD  06/22/23   [name] January 04, 1946 [id number]  Referring Provider: Everrett Coombe, DO Primary Care Provider: Everrett Coombe, DO Reason for consultation: Subjective   Assessment & Plan  Diagnoses and all orders for this visit:  Uncontrolled type 2 diabetes mellitus with hyperglycemia (HCC) -     POCT glycosylated hemoglobin (Hb A1C) -     empagliflozin (JARDIANCE) 25 MG TABS tablet; Take 1 tablet (25 mg total) by mouth daily before breakfast. -     sitaGLIPtin (JANUVIA) 50 MG tablet; Take 1 tablet (50 mg total) by mouth daily.  Long term (current) use of oral hypoglycemic drugs  Mixed hypercholesterolemia and hypertriglyceridemia  Type 2 diabetes mellitus with diabetic neuropathy, without long-term current use of insulin (HCC)   On levothyroxine 75 mcg po every day TSH WNL, FT4 checked previously was normal Continue current dose   Diabetes complicated by neuropathy, nephropathy, CAD Hba1c goal less than 7.0, current Hba1c is 7.3% Will recommend for the following change of medications to: Jardiance 25 mg every day Januvia 50 mg every day  No known contraindications/side effects to any of above medications No UTI  Hyperlipidemia -Last LDL at goal: 53 -on rosuvastatin 40 mg QD -Follow low fat diet and exercise   -Blood pressure goal <140/90 - Microalbumin/creatinine goal < 30 -Not on ACE/ARB, defer to nephrology -diet changes including salt restriction -limit eating outside -counseled BP targets per standards of diabetes care -Uncontrolled blood pressure can lead to retinopathy, nephropathy and cardiovascular and atherosclerotic heart disease  -Newly diagnosed subclinical hypothyroidism  TSH persistently 9-12, with normal Total T4 Start levothyroxine 75 mcg qam   Reviewed and counseled on: -A1C target -Blood sugar targets -Complications of uncontrolled diabetes  -Checking blood sugar before meals  and bedtime and bring log next visit -All medications with mechanism of action and side effects -Hypoglycemia management: rule of 15's, Glucagon Emergency Kit and medical alert ID -low-carb low-fat plate-method diet -At least 20 minutes of physical activity per day -Annual dilated retinal eye exam and foot exam -compliance and follow up needs -follow up as scheduled or earlier if problem gets worse  Call if blood sugar is less than 70 or consistently above 250    Take a 15 gm snack of carbohydrate at bedtime before you go to sleep if your blood sugar is less than 100.    If you are going to fast after midnight for a test or procedure, ask your physician for instructions on how to reduce/decrease your insulin dose.    Call if blood sugar is less than 70 or consistently above 250  -Treating a low sugar by rule of 15  (15 gms of sugar every 15 min until sugar is more than 70) If you feel your sugar is low, test your sugar to be sure If your sugar is low (less than 70), then take 15 grams of a fast acting Carbohydrate (3-4 glucose tablets or glucose gel or 4 ounces of juice or regular soda) Recheck your sugar 15 min after treating low to make sure it is more than 70 If sugar is still less than 70, treat again with 15 grams of carbohydrate          Don't drive the hour of hypoglycemia  If unconscious/unable to eat or drink by mouth, use glucagon injection or nasal spray baqsimi and call 911. Can repeat again in 15 min if still unconscious.  Return in about 3 months (around  09/22/2023).   I have reviewed current medications, nurse's notes, allergies, vital signs, past medical and surgical history, family medical history, and social history for this encounter. Counseled patient on symptoms, examination findings, lab findings, imaging results, treatment decisions and monitoring and prognosis. The patient understood the recommendations and agrees with the treatment plan. All questions regarding  treatment plan were fully answered.  Aaron [place], MD  06/22/23    History of Present Illness [name] is a 78 y.o. year old male who presents for evaluation of Type 2 diabetes mellitus.  [name] was first diagnosed in 2001.   Diabetes education +  Currently taking, Jardiance 25 mg every day  Stopped Januvia 50 mg every day  Previous history: Non-insulin hypoglycemic drugs: Januvia 50 mg daily (gets samples for the year by another provider),  Took Prandin 0.5 mg qam, jardiance 10 mg every day without issues  He was previously on metformin which was stopped because of renal dysfunction Amaryl was added in 2013 starting at 4 mg daily Januvia was started in 2018  Side effects from medications: None  COMPLICATIONS +  MI , - Stroke -  retinopathy, last eye exam 2023 +  neuropathy +  nephropathy  BLOOD SUGAR DATA  CGM interpretation: At today's visit, we reviewed her CGM downloads. The full report is scanned in the media. Reviewing the CGM trends, BG are mildly elevated in the daytime.  Physical Exam  BP 130/80   Pulse 70   Ht 6\' 2"  (1.88 m)   Wt 213 lb (96.6 kg)   SpO2 95%   BMI 27.35 kg/m    Constitutional: well developed, well nourished Head: normocephalic, atraumatic Eyes: sclera anicteric, no redness Neck: supple Lungs: normal respiratory effort Neurology: alert and oriented Skin: dry, no appreciable rashes Musculoskeletal: no appreciable defects Psychiatric: normal mood and affect Diabetic Foot Exam - Simple   No data filed      Current Medications Patient's Medications  New Prescriptions   SITAGLIPTIN (JANUVIA) 50 MG TABLET    Take 1 tablet (50 mg total) by mouth daily.  Previous Medications   ALBUTEROL (VENTOLIN HFA) 108 (90 BASE) MCG/ACT INHALER    Inhale 2 puffs into the lungs every 6 (six) hours as needed for wheezing or shortness of breath.   ALLOPURINOL (ZYLOPRIM) 100 MG TABLET    TAKE 1/2 TABLET BY MOUTH DAILY   AMIODARONE  (PACERONE) 200 MG TABLET    Take 200 mg by mouth daily. Dr. Sharyn [name]   AMLODIPINE (NORVASC) 10 MG TABLET    TAKE 1 TABLET BY MOUTH EVERY DAY   ASPIRIN EC 81 MG TABLET    Take 81 mg by mouth daily. Swallow whole.   CHLORTHALIDONE (HYGROTON) 25 MG TABLET    Take 25 mg by mouth 3 (three) times a week.   COLCHICINE 0.6 MG TABLET    Take 1 tablet (0.6 mg total) by mouth daily. Please bill discount card vs insurance- BIN [phone], PCN [id number], Group [id number], Member ID [id number]   CONTINUOUS GLUCOSE SENSOR (FREESTYLE LIBRE 3 SENSOR) MISC    USE 1 AS DIRECTED EVERY 14 DAYS TO CHECK GLUCOSE CONTINUOUSLY   DOCUSATE SODIUM (COLACE) 100 MG CAPSULE    Take 100 mg by mouth daily as needed for mild constipation.   FERROUS SULFATE 325 (65 FE) MG TABLET    Take 325 mg by mouth daily with breakfast.   FUROSEMIDE (LASIX) 20 MG TABLET    Take 1 tablet (20 mg total) by mouth daily.  HYDRALAZINE (APRESOLINE) 100 MG TABLET    Take 1 tablet (100 mg total) by mouth 3 (three) times daily.   LEVOFLOXACIN (LEVAQUIN) 500 MG TABLET    Take 1 tablet by mouth daily.   LEVOTHYROXINE (SYNTHROID) 75 MCG TABLET    TAKE 1 TABLET BY MOUTH EVERY DAY   METOPROLOL SUCCINATE (TOPROL-XL) 50 MG 24 HR TABLET    Take 50 mg by mouth daily.   OMEPRAZOLE (PRILOSEC) 40 MG CAPSULE    TAKE 1 CAPSULE BY MOUTH EVERY MORNING AND AT BEDTIME   ROSUVASTATIN (CRESTOR) 40 MG TABLET    TAKE 1 TABLET BY MOUTH EVERY DAY   SILVER SULFADIAZINE (SILVADENE) 1 % CREAM    Apply 1 Application topically daily.   SPIRONOLACTONE (ALDACTONE) 25 MG TABLET    Take 0.5 tablets (12.5 mg total) by mouth daily.  Modified Medications   Modified Medication Previous Medication   EMPAGLIFLOZIN (JARDIANCE) 25 MG TABS TABLET empagliflozin (JARDIANCE) 25 MG TABS tablet      Take 1 tablet (25 mg total) by mouth daily before breakfast.    Take 1 tablet (25 mg total) by mouth daily before breakfast.  Discontinued Medications   No medications on file    Allergies Allergies  Allergen  Reactions   Shellfish Allergy Rash    Past Medical History Past Medical History:  Diagnosis Date   Arthritis    Asthma    no inhaler   Atrial fibrillation (HCC) 10/16/2020   CKD (chronic kidney disease) 04/13/2020   Coronary artery disease    cardiologist-  dr spruill; last visit 3 mos ago per pt   Enterobacter sepsis (HCC) 04/13/2020   GERD (gastroesophageal reflux disease)    History of MI (myocardial infarction)    1985   Hydronephrosis, left    Hypertension    Myocardial infarction (HCC)    Nephrolithiasis    left   Neuromuscular disorder (HCC)    TINGLING IN BOTH HANDS   Presence of tooth-root and mandibular implants    lower dental implants   Prostate cancer (HCC)    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   QT prolongation 10/16/2020   Shortness of breath    WITH EXERTION   Thigh abscess 04/13/2020   Type 2 diabetes mellitus (HCC)    Vaccine counseling 04/28/2022   Zoster 09/13/2021    Past Surgical History Past Surgical History:  Procedure Laterality Date   BUNIONECTOMY     CYSTOSCOPY W/ RETROGRADES Left 03/14/2014   Procedure: CYSTOSCOPY WITH LEFT RETROGRADE PYELOGRAM, Left Ureteroscopy, [place]we[place] ureteral Stent No string;  Surgeon: Danae Chen, MD;  Location: [facility];  Service: Urology;  Laterality: Left;   CYSTOSCOPY W/ URETERAL STENT PLACEMENT Bilateral 01/15/2020   Procedure: CYSTOSCOPY WITH RETROGRADE PYELOGRAM/URETERAL STENT PLACEMENT;  Surgeon: Sebastian Ache, MD;  Location: [facility];  Service: Urology;  Laterality: Bilateral;   CYSTOSCOPY/URETEROSCOPY/HOLMIUM LASER/STENT PLACEMENT Bilateral 03/17/2020   Procedure: CYSTOSCOPY/URETEROSCOPY/HOLMIUM LASER/STENT LEFT PLACEMENT;  Surgeon: Jerilee Field, MD;  Location: [facility];  Service: Urology;  Laterality: Bilateral;   HERNIA REPAIR     INCISION AND DRAINAGE Left 03/09/2020   Procedure: INCISION AND DRAINAGE LEFT HIP WITH LINER EXCHANGE;  Surgeon: Ollen Gross, MD;  Location: [facility];   Service: Orthopedics;  Laterality: Left;   IR FLUORO GUIDE CV LINE RIGHT  03/10/2020   IR RADIOLOGIST EVAL & MGMT  04/01/2020   IR RADIOLOGIST EVAL & MGMT  04/15/2020   IR RADIOLOGIST EVAL & MGMT  04/30/2020   IR REMOVAL TUN  CV CATH W/O FL  04/22/2020   IR US GUIDE BX ASP/DRAIN  03/17/2020   IR US GUIDE VASC ACCESS RIGHT  03/10/2020   PROSTATE BIOPSY     TOTAL HIP ARTHROPLASTY Left 03-20-2009   TOTAL HIP ARTHROPLASTY  04/09/2012   Procedure: TOTAL HIP ARTHROPLASTY;  Surgeon: Loanne Drilling, MD;  Location: [facility];  Service: Orthopedics;  Laterality: Right;   TOTAL KNEE ARTHROPLASTY Left 05-16-2008    Family History family history includes Cancer in his father, maternal uncle, and mother; Diabetes in his father; Multiple sclerosis in his daughter.  Social History Social History   Socioeconomic History   Marital status: Widowed    Spouse name: Not on file   Number of children: 5   Years of education: 9   Highest education level: 9th grade  Occupational History   Occupation: Retired  Tobacco Use   Smoking status: Former    Current packs/day: 0.00    Average packs/day: 1 pack/day for 25.0 years (25.0 ttl pk-yrs)    Types: Cigarettes    Start date: 04/12/1959    Quit date: 04/11/1984    Years since quitting: 39.2   Smokeless tobacco: Former    Quit date: 04/02/1985  Vaping Use   Vaping status: Never Used  Substance and Sexual Activity   Alcohol use: Not Currently    Comment: RARE   Drug use: No   Sexual activity: Not Currently  Other Topics Concern   Not on file  Social History Narrative   Lives alone. He had five children and one has deceased. He enjoys watching television.   Social Drivers of Health   Financial Resource Strain: Medium Risk (09/29/2022)   Overall Financial Resource Strain (CARDIA)    Difficulty of Paying Living Expenses: Somewhat hard  Food Insecurity: No Food Insecurity (09/27/2022)   Hunger Vital Sign    Worried About Running Out of Food in the Last Year:  Never true    Ran Out of Food in the Last Year: Never true  Transportation Needs: No Transportation Needs (09/27/2022)   PRAPARE - Administrator, Civil Service (Medical): No    Lack of Transportation (Non-Medical): No  Physical Activity: Sufficiently Active (09/27/2022)   Exercise Vital Sign    Days of Exercise per Week: 3 days    Minutes of Exercise per Session: 60 min  Stress: No Stress Concern Present (09/27/2022)   [employer] of Occupational Health - Occupational Stress Questionnaire    Feeling of Stress : Not at all  Social Connections: Moderately Isolated (09/27/2022)   Social Connection and Isolation Panel [NHANES]    Frequency of Communication with Friends and Family: More than three times a week    Frequency of Social Gatherings with Friends and Family: More than three times a week    Attends Religious Services: More than 4 times per year    Active Member of [employer] or Organizations: No    Attends Banker Meetings: Never    Marital Status: Widowed  Intimate Partner Violence: Not At Risk (09/27/2022)   Humiliation, Afraid, Rape, and Kick questionnaire    Fear of Current or Ex-Partner: No    Emotionally Abused: No    Physically Abused: No    Sexually Abused: No    Lab Results  Component Value Date   HGBA1C 7.3 (A) 06/22/2023   Lab Results  Component Value Date   CHOL 129 02/17/2023   Lab Results  Component Value Date   HDL 39 (  L) 02/17/2023   Lab Results  Component Value Date   LDLCALC 67 02/17/2023   Lab Results  Component Value Date   TRIG 157 (H) 02/17/2023   Lab Results  Component Value Date   CHOLHDL 3.3 02/17/2023   Lab Results  Component Value Date   CREATININE 1.97 (H) 06/07/2023   Lab Results  Component Value Date   GFR 40.72 (L) 12/10/2021   Lab Results  Component Value Date   MICROALBUR 2.5 (H) 02/17/2023      Component Value Date/Time   NA 140 06/07/2023 0902   NA 140 08/17/2022 0847   K 5.1 06/07/2023  0902   CL 111 (H) 06/07/2023 0902   CO2 23 06/07/2023 0902   GLUCOSE 138 (H) 06/07/2023 0902   BUN 32 (H) 06/07/2023 0902   BUN 39 (H) 08/17/2022 0847   CREATININE 1.97 (H) 06/07/2023 0902   CALCIUM 8.5 (L) 06/07/2023 0902   PROT 7.0 06/07/2023 0902   PROT 7.0 05/24/2022 0708   ALBUMIN 4.1 05/24/2022 0708   AST 12 06/07/2023 0902   ALT 10 06/07/2023 0902   ALKPHOS 65 05/24/2022 0708   BILITOT 0.3 06/07/2023 0902   BILITOT 0.2 05/24/2022 0708   GFRNONAA 45 (L) 05/08/2021 0420   GFRNONAA 58 (L) 10/16/2020 0916   GFRAA 67 10/16/2020 0916      Latest Ref Rng & Units 06/07/2023    9:02 AM 11/08/2022    8:46 AM 08/17/2022    8:47 AM  BMP  Glucose 65 - 99 mg/dL 161  096  86   BUN 7 - 25 mg/dL 32  28  39   Creatinine 0.70 - 1.28 mg/dL 0.45  4.09  8.11   BUN/Creat Ratio 6 - 22 (calc) 16  15  20    Sodium 135 - 146 mmol/L 140  137  140   Potassium 3.5 - 5.3 mmol/L 5.1  4.5  4.2   Chloride 98 - 110 mmol/L 111  108  108   CO2 20 - 32 mmol/L 23  21  20    Calcium 8.6 - 10.3 mg/dL 8.5  9.2  8.6        Component Value Date/Time   WBC 3.9 06/07/2023 0902   RBC 2.89 (L) 06/07/2023 0902   HGB 9.3 (L) 06/07/2023 0902   HCT 28.7 (L) 06/07/2023 0902   PLT 255 06/07/2023 0902   MCV 99.3 06/07/2023 0902   MCH 32.2 06/07/2023 0902   MCHC 32.4 06/07/2023 0902   RDW 13.1 06/07/2023 0902   LYMPHSABS 1,111 11/08/2022 0846   MONOABS 0.6 08/01/2020 0837   EOSABS 160 06/07/2023 0902   BASOSABS 12 06/07/2023 0902     Parts of this note may have been dictated using voice recognition software. There may be variances in spelling and vocabulary which are unintentional. Not all errors are proofread. Please notify the Thereasa Parkin if any discrepancies are noted or if the meaning of any statement is not clear.

## 2023-06-22 NOTE — Patient Instructions (Signed)

## 2023-06-22 NOTE — Telephone Encounter (Signed)
 Spoke to pt regarding Januvia 50 mg, Pt stated he stopped it because he was thought that Dr Roosevelt Locks D/C it. Transferred pt to front desk to schedule sooner appt.

## 2023-06-22 NOTE — Progress Notes (Signed)
   06/22/2023  Patient ID: [name], male   DOB: 03-May-1945, 78 y.o.   MRN: [id number]  Patient dropped copy of POI off at Dr. Anastasio Auerbach office.  Faxed into [employer] for continued processing of PAP application for Jardiance 25mg  daily.  Lenna Gilford, PharmD, DPLA

## 2023-06-23 ENCOUNTER — Encounter: Payer: Self-pay | Admitting: "Endocrinology

## 2023-06-29 DIAGNOSIS — I10 Essential (primary) hypertension: Secondary | ICD-10-CM | POA: Diagnosis not present

## 2023-06-29 DIAGNOSIS — N1832 Chronic kidney disease, stage 3b: Secondary | ICD-10-CM | POA: Diagnosis not present

## 2023-07-04 ENCOUNTER — Ambulatory Visit: Admitting: "Endocrinology

## 2023-07-05 ENCOUNTER — Ambulatory Visit: Admitting: Family Medicine

## 2023-07-11 ENCOUNTER — Ambulatory Visit (INDEPENDENT_AMBULATORY_CARE_PROVIDER_SITE_OTHER): Admitting: Family Medicine

## 2023-07-11 ENCOUNTER — Encounter: Payer: Self-pay | Admitting: Family Medicine

## 2023-07-11 VITALS — BP 163/64 | HR 59 | Ht 74.0 in | Wt 212.0 lb

## 2023-07-11 DIAGNOSIS — E1122 Type 2 diabetes mellitus with diabetic chronic kidney disease: Secondary | ICD-10-CM

## 2023-07-11 DIAGNOSIS — I159 Secondary hypertension, unspecified: Secondary | ICD-10-CM | POA: Diagnosis not present

## 2023-07-11 DIAGNOSIS — R2681 Unsteadiness on feet: Secondary | ICD-10-CM | POA: Diagnosis not present

## 2023-07-11 DIAGNOSIS — E785 Hyperlipidemia, unspecified: Secondary | ICD-10-CM | POA: Diagnosis not present

## 2023-07-11 DIAGNOSIS — T8459XD Infection and inflammatory reaction due to other internal joint prosthesis, subsequent encounter: Secondary | ICD-10-CM | POA: Diagnosis not present

## 2023-07-11 DIAGNOSIS — N1832 Chronic kidney disease, stage 3b: Secondary | ICD-10-CM

## 2023-07-11 DIAGNOSIS — Z96649 Presence of unspecified artificial hip joint: Secondary | ICD-10-CM

## 2023-07-11 MED ORDER — AMBULATORY NON FORMULARY MEDICATION: 0 refills | Status: AC

## 2023-07-11 NOTE — Assessment & Plan Note (Signed)
 He has unstable gait and is at increased risk for falls.  I have prescribed rollator walker.

## 2023-07-11 NOTE — Assessment & Plan Note (Signed)
Tolerating atorvastatin well, continue at current strength.   

## 2023-07-11 NOTE — Assessment & Plan Note (Signed)
 Blood pressure is elevated today.  Nephrology may add hydralazine back on after updated labs in a couple weeks.  Will defer to nephrology and cardiology for continued management.

## 2023-07-11 NOTE — Assessment & Plan Note (Signed)
Well controlled.  Managed by endocrinology.

## 2023-07-11 NOTE — Assessment & Plan Note (Signed)
 Followed by ID.  He continues on Levaquin.  May do a trial off of this after the next 6 months.

## 2023-07-11 NOTE — Progress Notes (Signed)
 [name] - 78 y.o. male MRN [id number]  Date of birth: 1945/05/29  Subjective Chief Complaint  Patient presents with   Medical Management of Chronic Issues    HPI [name] is a 78 y.o. male here today for follow up visit.   He reports that he is doing pretty well at this time.  Reports that there is some dispute between his nephrologist as well as cardiologist regarding management of his antihypertensive medications.  Reports that for some reason his cardiologist had him discontinue Jardiance which his nephrologist disagreed with.  He has since restarted this.  His blood pressure was better controlled with hydralazine however his nephrologist asked him to hold this until all lab work returns.  He does continue on amlodipine, chlorthalidone, Aldactone and metoprolol.  A1c has remained pretty well-controlled.  He is seeing endocrinology.  Continues to see infectious disease for chronic infection of the left hip.  He reports that infectious disease is looking at possibly trying him off of Levaquin after the next 6 months.  He still needs rollator for gait abnormality.  ROS:  A comprehensive ROS was completed and negative except as noted per HPI  Allergies  Allergen Reactions   Shellfish Allergy Rash    Past Medical History:  Diagnosis Date   Arthritis    Asthma    no inhaler   Atrial fibrillation (HCC) 10/16/2020   CKD (chronic kidney disease) 04/13/2020   Coronary artery disease    cardiologist-  dr spruill; last visit 3 mos ago per pt   Enterobacter sepsis (HCC) 04/13/2020   GERD (gastroesophageal reflux disease)    History of MI (myocardial infarction)    1985   Hydronephrosis, left    Hypertension    Myocardial infarction (HCC)    Nephrolithiasis    left   Neuromuscular disorder (HCC)    TINGLING IN BOTH HANDS   Presence of tooth-root and mandibular implants    lower dental implants   Prostate cancer (HCC)    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    QT prolongation 10/16/2020   Shortness of breath    WITH EXERTION   Thigh abscess 04/13/2020   Type 2 diabetes mellitus (HCC)    Vaccine counseling 04/28/2022   Zoster 09/13/2021    Past Surgical History:  Procedure Laterality Date   BUNIONECTOMY     CYSTOSCOPY W/ RETROGRADES Left 03/14/2014   Procedure: CYSTOSCOPY WITH LEFT RETROGRADE PYELOGRAM, Left Ureteroscopy, [place] ureteral Stent No string;  Surgeon: Danae Chen, MD;  Location: [facility];  Service: Urology;  Laterality: Left;   CYSTOSCOPY W/ URETERAL STENT PLACEMENT Bilateral 01/15/2020   Procedure: CYSTOSCOPY WITH RETROGRADE PYELOGRAM/URETERAL STENT PLACEMENT;  Surgeon: Sebastian Ache, MD;  Location: [facility];  Service: Urology;  Laterality: Bilateral;   CYSTOSCOPY/URETEROSCOPY/HOLMIUM LASER/STENT PLACEMENT Bilateral 03/17/2020   Procedure: CYSTOSCOPY/URETEROSCOPY/HOLMIUM LASER/STENT LEFT PLACEMENT;  Surgeon: Jerilee Field, MD;  Location: [facility];  Service: Urology;  Laterality: Bilateral;   HERNIA REPAIR     INCISION AND DRAINAGE Left 03/09/2020   Procedure: INCISION AND DRAINAGE LEFT HIP WITH LINER EXCHANGE;  Surgeon: Ollen Gross, MD;  Location: [facility];  Service: Orthopedics;  Laterality: Left;   IR FLUORO GUIDE CV LINE RIGHT  03/10/2020   IR RADIOLOGIST EVAL & MGMT  04/01/2020   IR RADIOLOGIST EVAL & MGMT  04/15/2020   IR RADIOLOGIST EVAL & MGMT  04/30/2020   IR REMOVAL TUN CV CATH W/O FL  04/22/2020   IR US GUIDE BX ASP/DRAIN  03/17/2020   IR US GUIDE VASC ACCESS RIGHT  03/10/2020   PROSTATE BIOPSY     TOTAL HIP ARTHROPLASTY Left 03-20-2009   TOTAL HIP ARTHROPLASTY  04/09/2012   Procedure: TOTAL HIP ARTHROPLASTY;  Surgeon: Loanne Drilling, MD;  Location: [facility];  Service: Orthopedics;  Laterality: Right;   TOTAL KNEE ARTHROPLASTY Left 05-16-2008    Social History   Socioeconomic History   Marital status: Widowed    Spouse name: Not on file   Number of children: 5   Years of education: 9   Highest  education level: 9th grade  Occupational History   Occupation: Retired  Tobacco Use   Smoking status: Former    Current packs/day: 0.00    Average packs/day: 1 pack/day for 25.0 years (25.0 ttl pk-yrs)    Types: Cigarettes    Start date: 04/12/1959    Quit date: 04/11/1984    Years since quitting: 39.2   Smokeless tobacco: Former    Quit date: 04/02/1985  Vaping Use   Vaping status: Never Used  Substance and Sexual Activity   Alcohol use: Not Currently    Comment: RARE   Drug use: No   Sexual activity: Not Currently  Other Topics Concern   Not on file  Social History Narrative   Lives alone. He had five children and one has deceased. He enjoys watching television.   Social Drivers of Health   Financial Resource Strain: Medium Risk (09/29/2022)   Overall Financial Resource Strain (CARDIA)    Difficulty of Paying Living Expenses: Somewhat hard  Food Insecurity: No Food Insecurity (09/27/2022)   Hunger Vital Sign    Worried About Running Out of Food in the Last Year: Never true    Ran Out of Food in the Last Year: Never true  Transportation Needs: No Transportation Needs (09/27/2022)   PRAPARE - Administrator, Civil Service (Medical): No    Lack of Transportation (Non-Medical): No  Physical Activity: Sufficiently Active (09/27/2022)   Exercise Vital Sign    Days of Exercise per Week: 3 days    Minutes of Exercise per Session: 60 min  Stress: No Stress Concern Present (09/27/2022)   [employer] of Occupational Health - Occupational Stress Questionnaire    Feeling of Stress : Not at all  Social Connections: Moderately Isolated (09/27/2022)   Social Connection and Isolation Panel [NHANES]    Frequency of Communication with Friends and Family: More than three times a week    Frequency of Social Gatherings with Friends and Family: More than three times a week    Attends Religious Services: More than 4 times per year    Active Member of [employer] or Organizations: No     Attends Banker Meetings: Never    Marital Status: Widowed    Family History  Problem Relation Age of Onset   Cancer Mother        breast   Multiple sclerosis Daughter    Cancer Father        prostate   Diabetes Father    Cancer Maternal Uncle        bone    Health Maintenance  Topic Date Due   Zoster Vaccines- Shingrix (1 of 2) 08/02/2023 (Originally 08/05/1964)   FOOT EXAM  08/02/2023   COVID-19 Vaccine (4 - Mixed Product risk 2024-25 season) 08/04/2023   Medicare Annual Wellness (AWV)  09/27/2023   OPHTHALMOLOGY EXAM  10/06/2023   INFLUENZA VACCINE  11/10/2023   HEMOGLOBIN A1C  12/23/2023   Diabetic kidney evaluation - Urine ACR  03/13/2024   Diabetic kidney evaluation - eGFR measurement  06/06/2024   DTaP/Tdap/Td (2 - Td or Tdap) 04/10/2025   Pneumonia Vaccine 26+ Years old  Completed   Hepatitis C Screening  Completed   HPV VACCINES  Aged Out   Colonoscopy  Discontinued     ----------------------------------------------------------------------------------------------------------------------------------------------------------------------------------------------------------------- Physical Exam BP (!) 163/64 (BP Location: Left Arm, Patient Position: Sitting, Cuff Size: Normal)   Pulse (!) 59   Ht 6\' 2"  (1.88 m)   Wt 212 lb (96.2 kg)   SpO2 96%   BMI 27.22 kg/m   Physical Exam Constitutional:      Appearance: Normal appearance.  HENT:     Head: Normocephalic and atraumatic.  Eyes:     General: No scleral icterus. Cardiovascular:     Rate and Rhythm: Normal rate and regular rhythm.  Pulmonary:     Effort: Pulmonary effort is normal.     Breath sounds: Normal breath sounds.  Musculoskeletal:     Cervical back: Neck supple.  Neurological:     Mental Status: He is alert.     Comments: Abnormal, antalgic gait.  Psychiatric:        Mood and Affect: Mood normal.        Behavior: Behavior normal.      ------------------------------------------------------------------------------------------------------------------------------------------------------------------------------------------------------------------- Assessment and Plan  HTN (hypertension) Blood pressure is elevated today.  Nephrology may add hydralazine back on after updated labs in a couple weeks.  Will defer to nephrology and cardiology for continued management.  Hyperlipidemia with target LDL less than 70 Tolerating atorvastatin well, continue at current strength.   Prosthetic hip infection (HCC) Followed by ID.  He continues on Levaquin.  May do a trial off of this after the next 6 months.  Type 2 diabetes mellitus with chronic kidney disease (HCC) Well controlled.  Managed by endocrinology.   Gait instability He has unstable gait and is at increased risk for falls.  I have prescribed rollator walker.   Meds ordered this encounter  Medications   AMBULATORY NON FORMULARY MEDICATION    Sig: Please provide rollator walker with hand brakes and seat.  Dx: R26.81 Gait instability    Dispense:  1 Device    Refill:  0    Return in about 6 months (around 01/10/2024) for Hypertension.    This visit occurred during the SARS-CoV-2 public health emergency.  Safety protocols were in place, including screening questions prior to the visit, additional usage of staff PPE, and extensive cleaning of exam room while observing appropriate contact time as indicated for disinfecting solutions.

## 2023-07-18 DIAGNOSIS — N1832 Chronic kidney disease, stage 3b: Secondary | ICD-10-CM | POA: Diagnosis not present

## 2023-07-18 DIAGNOSIS — R2681 Unsteadiness on feet: Secondary | ICD-10-CM | POA: Diagnosis not present

## 2023-08-07 ENCOUNTER — Other Ambulatory Visit: Payer: PPO

## 2023-08-13 ENCOUNTER — Other Ambulatory Visit: Payer: Self-pay | Admitting: Family Medicine

## 2023-08-13 DIAGNOSIS — I159 Secondary hypertension, unspecified: Secondary | ICD-10-CM

## 2023-08-20 ENCOUNTER — Other Ambulatory Visit: Payer: Self-pay | Admitting: "Endocrinology

## 2023-08-20 DIAGNOSIS — E038 Other specified hypothyroidism: Secondary | ICD-10-CM

## 2023-08-21 NOTE — Telephone Encounter (Signed)
 Requested Prescriptions     Pending Prescriptions Disp Refills    levothyroxine (SYNTHROID) 75 MCG tablet [Pharmacy Med Name: LEVOTHYROXINE 75 MCG TABLET] 90 tablet 0     Sig: TAKE 1 TABLET BY MOUTH EVERY DAY

## 2023-08-23 DIAGNOSIS — N183 Chronic kidney disease, stage 3 unspecified: Secondary | ICD-10-CM | POA: Diagnosis not present

## 2023-08-23 DIAGNOSIS — N200 Calculus of kidney: Secondary | ICD-10-CM | POA: Diagnosis not present

## 2023-08-23 DIAGNOSIS — Z8546 Personal history of malignant neoplasm of prostate: Secondary | ICD-10-CM | POA: Diagnosis not present

## 2023-09-12 ENCOUNTER — Other Ambulatory Visit: Payer: Self-pay | Admitting: Family Medicine

## 2023-09-20 DIAGNOSIS — E1122 Type 2 diabetes mellitus with diabetic chronic kidney disease: Secondary | ICD-10-CM | POA: Diagnosis not present

## 2023-09-20 DIAGNOSIS — N1832 Chronic kidney disease, stage 3b: Secondary | ICD-10-CM | POA: Diagnosis not present

## 2023-09-20 DIAGNOSIS — D631 Anemia in chronic kidney disease: Secondary | ICD-10-CM | POA: Diagnosis not present

## 2023-09-20 DIAGNOSIS — I129 Hypertensive chronic kidney disease with stage 1 through stage 4 chronic kidney disease, or unspecified chronic kidney disease: Secondary | ICD-10-CM | POA: Diagnosis not present

## 2023-09-21 DIAGNOSIS — M1711 Unilateral primary osteoarthritis, right knee: Secondary | ICD-10-CM | POA: Diagnosis not present

## 2023-09-22 ENCOUNTER — Encounter: Payer: Self-pay | Admitting: "Endocrinology

## 2023-09-22 ENCOUNTER — Ambulatory Visit (INDEPENDENT_AMBULATORY_CARE_PROVIDER_SITE_OTHER): Admitting: "Endocrinology

## 2023-09-22 VITALS — BP 136/60 | HR 85 | Ht 74.0 in | Wt 195.0 lb

## 2023-09-22 DIAGNOSIS — E782 Mixed hyperlipidemia: Secondary | ICD-10-CM

## 2023-09-22 DIAGNOSIS — E038 Other specified hypothyroidism: Secondary | ICD-10-CM

## 2023-09-22 DIAGNOSIS — E1165 Type 2 diabetes mellitus with hyperglycemia: Secondary | ICD-10-CM

## 2023-09-22 DIAGNOSIS — Z7984 Long term (current) use of oral hypoglycemic drugs: Secondary | ICD-10-CM | POA: Diagnosis not present

## 2023-09-22 LAB — POCT GLYCOSYLATED HEMOGLOBIN (HGB A1C): Hemoglobin A1C: 6.8 % — AB (ref 4.0–5.6)

## 2023-09-22 MED ORDER — GLIPIZIDE 2.5 MG PO TABS: 1.0000 | ORAL_TABLET | ORAL | 3 refills | Status: DC | PRN

## 2023-09-22 NOTE — Progress Notes (Signed)
 Outpatient Endocrinology Note Aaron Newcomer, MD  09/22/23   [name] 78/31/1947 [id number]  Referring Provider: Adela Holter, DO Primary Care Provider: Adela Holter, DO Reason for consultation: Subjective   Assessment & Plan  Diagnoses and all orders for this visit:  Uncontrolled type 2 diabetes mellitus with hyperglycemia (HCC) -     POCT glycosylated hemoglobin (Hb A1C)  Long term (current) use of oral hypoglycemic drugs  Mixed hypercholesterolemia and hypertriglyceridemia  Subclinical hypothyroidism -     TSH + free T4  Other orders -     glipiZIDE 2.5 MG TABS; Take 1 tablet by mouth as needed (for blood sugar >200).   05/2022 Developed subclinical hypothyroidism  TSH persistently 9-12, with normal Total T4 On levothyroxine  75 mcg po every day TSH WNL, FT4 checked previously was normal Continue current dose Lab today   Diabetes complicated by neuropathy, nephropathy, CAD Hba1c goal less than 7.0, current Hba1c is 6.8% Will recommend for the following change of medications to: Jardiance  25 mg every day Januvia  50 mg every day Glipizide 2.5 mg prn BG >200 (gets steroid shot in knee)  No known contraindications/side effects to any of above medications No UTI  Hyperlipidemia -02/2023 Last LDL at goal: 67 -on rosuvastatin  40 mg QD -Follow low fat diet and exercise   -Blood pressure goal <140/90 - Microalbumin/creatinine goal < 30 -Not on ACE/ARB, defer to nephrology -diet changes including salt restriction -limit eating outside -counseled BP targets per standards of diabetes care -Uncontrolled blood pressure can lead to retinopathy, nephropathy and cardiovascular and atherosclerotic heart disease   Reviewed and counseled on: -A1C target -Blood sugar targets -Complications of uncontrolled diabetes  -Checking blood sugar before meals and bedtime and bring log next visit -All medications with mechanism of action and side  effects -Hypoglycemia management: rule of 15's, Glucagon Emergency Kit and medical alert ID -low-carb low-fat plate-method diet -At least 20 minutes of physical activity per day -Annual dilated retinal eye exam and foot exam -compliance and follow up needs -follow up as scheduled or earlier if problem gets worse  Call if blood sugar is less than 70 or consistently above 250    Take a 15 gm snack of carbohydrate at bedtime before you go to sleep if your blood sugar is less than 100.    If you are going to fast after midnight for a test or procedure, ask your physician for instructions on how to reduce/decrease your insulin  dose.    Call if blood sugar is less than 70 or consistently above 250  -Treating a low sugar by rule of 15  (15 gms of sugar every 15 min until sugar is more than 70) If you feel your sugar is low, test your sugar to be sure If your sugar is low (less than 70), then take 15 grams of a fast acting Carbohydrate (3-4 glucose tablets or glucose gel or 4 ounces of juice or regular soda) Recheck your sugar 15 min after treating low to make sure it is more than 70 If sugar is still less than 70, treat again with 15 grams of carbohydrate          Don't drive the hour of hypoglycemia  If unconscious/unable to eat or drink by mouth, use glucagon injection or nasal spray baqsimi and call 911. Can repeat again in 15 min if still unconscious.  Return in about 6 months (around 03/23/2024) for visit, labs today.   I have reviewed current medications, nurse's  notes, allergies, vital signs, past medical and surgical history, family medical history, and social history for this encounter. Counseled patient on symptoms, examination findings, lab findings, imaging results, treatment decisions and monitoring and prognosis. The patient understood the recommendations and agrees with the treatment plan. All questions regarding treatment plan were fully answered.  Aaron Newcomer, MD   09/22/23    History of Present Illness [name] is a 78 y.o. year old male who presents for evaluation of Type 2 diabetes mellitus.  [name] was first diagnosed in 2001.   Diabetes education +  Currently taking, Jardiance  25 mg every day  Stopped Januvia  50 mg every day  Previous history: Non-insulin  hypoglycemic drugs: Januvia  50 mg daily (gets samples for the year by another provider),  Took Prandin  0.5 mg qam, jardiance  10 mg every day without issues  He was previously on metformin  which was stopped because of renal dysfunction Amaryl  was added in 2013 starting at 4 mg daily Januvia  was started in 2018  Side effects from medications: None  COMPLICATIONS +  MI , - Stroke -  retinopathy, last eye exam 2023 +  neuropathy +  nephropathy  BLOOD SUGAR DATA  CGM interpretation: At today's visit, we reviewed her CGM downloads. The full report is scanned in the media. Reviewing the CGM trends, BG are well controlled across the day.  Physical Exam  BP 136/60   Pulse 85   Ht 6' 2 (1.88 m)   Wt 195 lb (88.5 kg)   SpO2 98%   BMI 25.04 kg/m    Constitutional: well developed, well nourished Head: normocephalic, atraumatic Eyes: sclera anicteric, no redness Neck: supple Lungs: normal respiratory effort Neurology: alert and oriented Skin: dry, no appreciable rashes Musculoskeletal: no appreciable defects Psychiatric: normal mood and affect Diabetic Foot Exam - Simple   No data filed      Current Medications Patient's Medications  New Prescriptions   GLIPIZIDE 2.5 MG TABS    Take 1 tablet by mouth as needed (for blood sugar >200).  Previous Medications   ALBUTEROL  (VENTOLIN  HFA) 108 (90 BASE) MCG/ACT INHALER    Inhale 2 puffs into the lungs every 6 (six) hours as needed for wheezing or shortness of breath.   ALLOPURINOL  (ZYLOPRIM ) 100 MG TABLET    TAKE 1/2 TABLET BY MOUTH DAILY   AMBULATORY NON FORMULARY MEDICATION    Please provide rollator  walker with hand brakes and seat.  Dx: R26.81 Gait instability   AMIODARONE  (PACERONE ) 200 MG TABLET    Take 200 mg by mouth daily. Dr. Glena [name]   AMLODIPINE  (NORVASC ) 10 MG TABLET    TAKE 1 TABLET BY MOUTH EVERY DAY   ASPIRIN  EC 81 MG TABLET    Take 81 mg by mouth daily. Swallow whole.   CHLORTHALIDONE (HYGROTON) 25 MG TABLET    Take 25 mg by mouth 3 (three) times a week.   COLCHICINE  0.6 MG TABLET    Take 1 tablet (0.6 mg total) by mouth daily. Please bill discount card vs insurance- BIN [phone], PCN [id number], Group [id number], Member ID [id number]   CONTINUOUS GLUCOSE SENSOR (FREESTYLE LIBRE 3 SENSOR) MISC    USE 1 AS DIRECTED EVERY 14 DAYS TO CHECK GLUCOSE CONTINUOUSLY   DOCUSATE SODIUM  (COLACE) 100 MG CAPSULE    Take 100 mg by mouth daily as needed for mild constipation.   EMPAGLIFLOZIN  (JARDIANCE ) 25 MG TABS TABLET    Take 1 tablet (25 mg total) by mouth daily before breakfast.  FERROUS SULFATE  325 (65 FE) MG TABLET    Take 325 mg by mouth daily with breakfast.   FUROSEMIDE  (LASIX ) 20 MG TABLET    Take 1 tablet (20 mg total) by mouth daily.   HYDRALAZINE  (APRESOLINE ) 50 MG TABLET    Take 50 mg by mouth 2 (two) times daily.   LEVOFLOXACIN  (LEVAQUIN ) 500 MG TABLET    Take 1 tablet by mouth daily.   LEVOTHYROXINE  (SYNTHROID ) 75 MCG TABLET    TAKE 1 TABLET BY MOUTH EVERY DAY   METOPROLOL  SUCCINATE (TOPROL -XL) 50 MG 24 HR TABLET    Take 50 mg by mouth daily.   OMEPRAZOLE  (PRILOSEC) 40 MG CAPSULE    TAKE 1 CAPSULE BY MOUTH EVERY MORNING AND AT BEDTIME   ROSUVASTATIN  (CRESTOR ) 40 MG TABLET    TAKE 1 TABLET BY MOUTH EVERY DAY   SILVER  SULFADIAZINE  (SILVADENE ) 1 % CREAM    Apply 1 Application topically daily.   SITAGLIPTIN  (JANUVIA ) 50 MG TABLET    Take 1 tablet (50 mg total) by mouth daily.   SPIRONOLACTONE  (ALDACTONE ) 25 MG TABLET    Take 0.5 tablets (12.5 mg total) by mouth daily.  Modified Medications   No medications on file  Discontinued Medications   No medications on file    Allergies Allergies   Allergen Reactions   Shellfish Allergy Rash    Past Medical History Past Medical History:  Diagnosis Date   Arthritis    Asthma    no inhaler   Atrial fibrillation (HCC) 10/16/2020   CKD (chronic kidney disease) 04/13/2020   Coronary artery disease    cardiologist-  dr spruill; last visit 3 mos ago per pt   Enterobacter sepsis (HCC) 04/13/2020   GERD (gastroesophageal reflux disease)    History of MI (myocardial infarction)    1985   Hydronephrosis, left    Hypertension    Myocardial infarction (HCC)    Nephrolithiasis    left   Neuromuscular disorder (HCC)    TINGLING IN BOTH HANDS   Presence of tooth-root and mandibular implants    lower dental implants   Prostate cancer (HCC)    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   QT prolongation 10/16/2020   Shortness of breath    WITH EXERTION   Thigh abscess 04/13/2020   Type 2 diabetes mellitus (HCC)    Vaccine counseling 04/28/2022   Zoster 09/13/2021    Past Surgical History Past Surgical History:  Procedure Laterality Date   BUNIONECTOMY     CYSTOSCOPY W/ RETROGRADES Left 03/14/2014   Procedure: CYSTOSCOPY WITH LEFT RETROGRADE PYELOGRAM, Left Ureteroscopy, [place] ureteral Stent No string;  Surgeon: Arleen Bells, MD;  Location: [facility];  Service: Urology;  Laterality: Left;   CYSTOSCOPY W/ URETERAL STENT PLACEMENT Bilateral 01/15/2020   Procedure: CYSTOSCOPY WITH RETROGRADE PYELOGRAM/URETERAL STENT PLACEMENT;  Surgeon: Osborn Blaze, MD;  Location: [facility];  Service: Urology;  Laterality: Bilateral;   CYSTOSCOPY/URETEROSCOPY/HOLMIUM LASER/STENT PLACEMENT Bilateral 03/17/2020   Procedure: CYSTOSCOPY/URETEROSCOPY/HOLMIUM LASER/STENT LEFT PLACEMENT;  Surgeon: Christina Coyer, MD;  Location: [facility];  Service: Urology;  Laterality: Bilateral;   HERNIA REPAIR     INCISION AND DRAINAGE Left 03/09/2020   Procedure: INCISION AND DRAINAGE LEFT HIP WITH LINER EXCHANGE;  Surgeon: Liliane Rei, MD;  Location:  [facility];  Service: Orthopedics;  Laterality: Left;   IR FLUORO GUIDE CV LINE RIGHT  03/10/2020   IR RADIOLOGIST EVAL & MGMT  04/01/2020   IR RADIOLOGIST EVAL & MGMT  04/15/2020   IR  RADIOLOGIST EVAL & MGMT  04/30/2020   IR REMOVAL TUN CV CATH W/O FL  04/22/2020   IR US  GUIDE BX ASP/DRAIN  03/17/2020   IR US  GUIDE VASC ACCESS RIGHT  03/10/2020   PROSTATE BIOPSY     TOTAL HIP ARTHROPLASTY Left 03-20-2009   TOTAL HIP ARTHROPLASTY  04/09/2012   Procedure: TOTAL HIP ARTHROPLASTY;  Surgeon: Aurther Blue, MD;  Location: [facility];  Service: Orthopedics;  Laterality: Right;   TOTAL KNEE ARTHROPLASTY Left 05-16-2008    Family History family history includes Cancer in his father, maternal uncle, and mother; Diabetes in his father; Multiple sclerosis in his daughter.  Social History Social History   Socioeconomic History   Marital status: Widowed    Spouse name: Not on file   Number of children: 5   Years of education: 9   Highest education level: 9th grade  Occupational History   Occupation: Retired  Tobacco Use   Smoking status: Former    Current packs/day: 0.00    Average packs/day: 1 pack/day for 25.0 years (25.0 ttl pk-yrs)    Types: Cigarettes    Start date: 04/12/1959    Quit date: 04/11/1984    Years since quitting: 39.4   Smokeless tobacco: Former    Quit date: 04/02/1985  Vaping Use   Vaping status: Never Used  Substance and Sexual Activity   Alcohol use: Not Currently    Comment: RARE   Drug use: No   Sexual activity: Not Currently  Other Topics Concern   Not on file  Social History Narrative   Lives alone. He had five children and one has deceased. He enjoys watching television.   Social Drivers of Health   Financial Resource Strain: Medium Risk (09/29/2022)   Overall Financial Resource Strain (CARDIA)    Difficulty of Paying Living Expenses: Somewhat hard  Food Insecurity: No Food Insecurity (09/27/2022)   Hunger Vital Sign    Worried About Running Out of Food in the  Last Year: Never true    Ran Out of Food in the Last Year: Never true  Transportation Needs: No Transportation Needs (09/27/2022)   PRAPARE - Administrator, Civil Service (Medical): No    Lack of Transportation (Non-Medical): No  Physical Activity: Sufficiently Active (09/27/2022)   Exercise Vital Sign    Days of Exercise per Week: 3 days    Minutes of Exercise per Session: 60 min  Stress: No Stress Concern Present (09/27/2022)   [employer] of Occupational Health - Occupational Stress Questionnaire    Feeling of Stress : Not at all  Social Connections: Moderately Isolated (09/27/2022)   Social Connection and Isolation Panel    Frequency of Communication with Friends and Family: More than three times a week    Frequency of Social Gatherings with Friends and Family: More than three times a week    Attends Religious Services: More than 4 times per year    Active Member of [employer] or Organizations: No    Attends Banker Meetings: Never    Marital Status: Widowed  Intimate Partner Violence: Not At Risk (09/27/2022)   Humiliation, Afraid, Rape, and Kick questionnaire    Fear of Current or Ex-Partner: No    Emotionally Abused: No    Physically Abused: No    Sexually Abused: No    Lab Results  Component Value Date   HGBA1C 6.8 (A) 09/22/2023   Lab Results  Component Value Date   CHOL 129 02/17/2023  Lab Results  Component Value Date   HDL 39 (L) 02/17/2023   Lab Results  Component Value Date   LDLCALC 67 02/17/2023   Lab Results  Component Value Date   TRIG 157 (H) 02/17/2023   Lab Results  Component Value Date   CHOLHDL 3.3 02/17/2023   Lab Results  Component Value Date   CREATININE 1.97 (H) 06/07/2023   Lab Results  Component Value Date   GFR 40.72 (L) 12/10/2021   Lab Results  Component Value Date   MICROALBUR 2.5 (H) 02/17/2023      Component Value Date/Time   NA 140 06/07/2023 0902   NA 140 08/17/2022 0847   K 5.1 06/07/2023  0902   CL 111 (H) 06/07/2023 0902   CO2 23 06/07/2023 0902   GLUCOSE 138 (H) 06/07/2023 0902   BUN 32 (H) 06/07/2023 0902   BUN 39 (H) 08/17/2022 0847   CREATININE 1.97 (H) 06/07/2023 0902   CALCIUM  8.5 (L) 06/07/2023 0902   PROT 7.0 06/07/2023 0902   PROT 7.0 05/24/2022 0708   ALBUMIN 4.1 05/24/2022 0708   AST 12 06/07/2023 0902   ALT 10 06/07/2023 0902   ALKPHOS 65 05/24/2022 0708   BILITOT 0.3 06/07/2023 0902   BILITOT 0.2 05/24/2022 0708   GFRNONAA 45 (L) 05/08/2021 0420   GFRNONAA 58 (L) 10/16/2020 0916   GFRAA 67 10/16/2020 0916      Latest Ref Rng & Units 06/07/2023    9:02 AM 11/08/2022    8:46 AM 08/17/2022    8:47 AM  BMP  Glucose 65 - 99 mg/dL 784  696  86   BUN 7 - 25 mg/dL 32  28  39   Creatinine 0.70 - 1.28 mg/dL 2.95  2.84  1.32   BUN/Creat Ratio 6 - 22 (calc) 16  15  20    Sodium 135 - 146 mmol/L 140  137  140   Potassium 3.5 - 5.3 mmol/L 5.1  4.5  4.2   Chloride 98 - 110 mmol/L 111  108  108   CO2 20 - 32 mmol/L 23  21  20    Calcium  8.6 - 10.3 mg/dL 8.5  9.2  8.6        Component Value Date/Time   WBC 3.9 06/07/2023 0902   RBC 2.89 (L) 06/07/2023 0902   HGB 9.3 (L) 06/07/2023 0902   HCT 28.7 (L) 06/07/2023 0902   PLT 255 06/07/2023 0902   MCV 99.3 06/07/2023 0902   MCH 32.2 06/07/2023 0902   MCHC 32.4 06/07/2023 0902   RDW 13.1 06/07/2023 0902   LYMPHSABS 1,111 11/08/2022 0846   MONOABS 0.6 08/01/2020 0837   EOSABS 160 06/07/2023 0902   BASOSABS 12 06/07/2023 0902     Parts of this note may have been dictated using voice recognition software. There may be variances in spelling and vocabulary which are unintentional. Not all errors are proofread. Please notify the [facility] if any discrepancies are noted or if the meaning of any statement is not clear.

## 2023-09-23 LAB — T4, FREE: Free T4: 2 ng/dL — ABNORMAL HIGH (ref 0.8–1.8)

## 2023-09-23 LAB — TSH+FREE T4: TSH W/REFLEX TO FT4: 0.26 m[IU]/L — ABNORMAL LOW (ref 0.40–4.50)

## 2023-09-25 ENCOUNTER — Ambulatory Visit: Payer: Self-pay | Admitting: "Endocrinology

## 2023-09-25 ENCOUNTER — Other Ambulatory Visit: Payer: Self-pay | Admitting: "Endocrinology

## 2023-09-25 ENCOUNTER — Other Ambulatory Visit: Payer: Self-pay

## 2023-09-25 DIAGNOSIS — E038 Other specified hypothyroidism: Secondary | ICD-10-CM

## 2023-09-25 MED ORDER — LEVOTHYROXINE SODIUM 75 MCG PO TABS: ORAL_TABLET | ORAL | 1 refills | Status: DC

## 2023-09-26 ENCOUNTER — Other Ambulatory Visit: Payer: Self-pay | Admitting: Family Medicine

## 2023-10-03 ENCOUNTER — Ambulatory Visit: Payer: PPO

## 2023-10-03 VITALS — Ht 73.0 in | Wt 196.0 lb

## 2023-10-03 DIAGNOSIS — Z0000 Encounter for general adult medical examination without abnormal findings: Secondary | ICD-10-CM | POA: Diagnosis not present

## 2023-10-03 NOTE — Patient Instructions (Signed)
  [name] , Thank you for taking time to come for your Medicare Wellness Visit. I appreciate your ongoing commitment to your health goals. Please review the following plan we discussed and let me know if I can assist you in the future.   These are the goals we discussed:  Goals       Medication Management      Patient Goals/Self-Care Activities Over the next 60 days, patient will:  take medications as prescribed and check blood pressure daily, document, and provide at future appointments  Follow Up Plan: Telephone follow up appointment with care management team member scheduled for: 2 months      Patient Stated (pt-stated)      09/21/2021 AWV Goal: Diabetes Management  Patient will maintain an A1C level below 7.0 Patient will not develop any diabetic foot complications Patient will not experience any hypoglycemic episodes over the next 3 months Patient will notify our office of any CBG readings outside of the provider recommended range by calling [phone] Patient will adhere to provider recommendations for diabetes management  Patient Self Management Activities take all medications as prescribed and report any negative side effects monitor and record blood sugar readings as directed adhere to a low carbohydrate diet that incorporates lean proteins, vegetables, whole grains, low glycemic fruits check feet daily noting any sores, cracks, injuries, or callous formations see PCP or podiatrist if he notices any changes in his legs, feet, or toenails Patient will visit PCP and have an A1C level checked every 3 to 6 months as directed  have a yearly eye exam to monitor for vascular changes associated with diabetes and will request that the report be sent to his pcp.  consult with his PCP regarding any changes in his health or new or worsening symptoms       Patient Stated (pt-stated)      Patient stated that he would like to be more active and build strength in his legs.       Patient Stated      Patient states he would like to maintain a healthy lifestyle.         This is a list of the screening recommended for you and due dates:  Health Maintenance  Topic Date Due   Zoster (Shingles) Vaccine (1 of 2) Never done   Complete foot exam   08/02/2023   COVID-19 Vaccine (4 - Mixed Product risk 2024-25 season) 08/04/2023   Eye exam for diabetics  10/06/2023   Flu Shot  11/10/2023   Yearly kidney health urinalysis for diabetes  03/13/2024   Hemoglobin A1C  03/23/2024   Yearly kidney function blood test for diabetes  06/06/2024   Medicare Annual Wellness Visit  10/02/2024   DTaP/Tdap/Td vaccine (2 - Td or Tdap) 04/10/2025   Pneumococcal Vaccine for age over 75  Completed   Hepatitis C Screening  Completed   Hepatitis B Vaccine  Aged Out   HPV Vaccine  Aged Out   Meningitis B Vaccine  Aged Out   Colon Cancer Screening  Discontinued

## 2023-10-03 NOTE — Progress Notes (Signed)
 Subjective:   [name] is a 78 y.o. male who presents for Medicare Annual/Subsequent preventive examination.  Visit Complete: Virtual I connected with  [name] on 10/03/23 by a audio enabled telemedicine application and verified that I am speaking with the correct person using two identifiers.  Patient Location: Home  Provider Location: Office/Clinic  I discussed the limitations of evaluation and management by telemedicine. The patient expressed understanding and agreed to proceed.  Vital Signs: Because this visit was a virtual/telehealth visit, some criteria may be missing or patient reported. Any vitals not documented were not able to be obtained and vitals that have been documented are patient reported.  Patient Medicare AWV questionnaire was completed by the patient on n/a; I have confirmed that all information answered by patient is correct and no changes since this date.  Cardiac Risk Factors include: male gender;obesity (BMI >30kg/m2);diabetes mellitus;advanced age (>79men, >22 women);hypertension;smoking/ tobacco exposure;dyslipidemia;family history of premature cardiovascular disease     Objective:    Today's Vitals   10/03/23 0855  Weight: 196 lb (88.9 kg)  Height: 6' 1 (1.854 m)   Body mass index is 25.86 kg/m.     10/03/2023    9:08 AM 06/06/2023   12:46 AM 05/21/2023    9:22 AM 05/10/2023    1:44 PM 10/29/2022    1:10 AM 09/27/2022    8:26 AM 09/02/2022    6:54 AM  Advanced Directives  Does Patient Have a Medical Advance Directive? Yes Yes No No No Yes No  Type of Advance Directive Living will Living will    Living will   Does patient want to make changes to medical advance directive? No - Patient declined     No - Patient declined   Would patient like information on creating a medical advance directive?   No - Patient declined No - Patient declined       Current Medications (verified) Outpatient Encounter Medications as of 10/03/2023   Medication Sig   albuterol  (VENTOLIN  HFA) 108 (90 Base) MCG/ACT inhaler Inhale 2 puffs into the lungs every 6 (six) hours as needed for wheezing or shortness of breath.   allopurinol  (ZYLOPRIM ) 100 MG tablet TAKE 1/2 TABLET BY MOUTH DAILY   AMBULATORY NON FORMULARY MEDICATION Please provide rollator walker with hand brakes and seat.  Dx: R26.81 Gait instability   amiodarone  (PACERONE ) 200 MG tablet Take 200 mg by mouth daily. Dr. Levern   amLODipine  (NORVASC ) 10 MG tablet TAKE 1 TABLET BY MOUTH EVERY DAY   aspirin  EC 81 MG tablet Take 81 mg by mouth daily. Swallow whole.   chlorthalidone (HYGROTON) 25 MG tablet Take 25 mg by mouth 3 (three) times a week.   colchicine  0.6 MG tablet Take 1 tablet (0.6 mg total) by mouth daily. Please bill discount card vs insurance- BIN [phone], PCN [id number], Group [id number], Member ID [id number]   Continuous Glucose Sensor (FREESTYLE LIBRE 3 PLUS SENSOR) MISC CHANGE SENSOR EVERY 15 DAYS   docusate sodium  (COLACE) 100 MG capsule Take 100 mg by mouth daily as needed for mild constipation.   empagliflozin  (JARDIANCE ) 25 MG TABS tablet Take 1 tablet (25 mg total) by mouth daily before breakfast.   ferrous sulfate  325 (65 FE) MG tablet Take 325 mg by mouth daily with breakfast.   furosemide  (LASIX ) 20 MG tablet Take 1 tablet (20 mg total) by mouth daily.   glipiZIDE  2.5 MG TABS Take 1 tablet by mouth as needed (for blood sugar >200).   levofloxacin  (  LEVAQUIN ) 500 MG tablet Take 1 tablet by mouth daily.   levothyroxine  (SYNTHROID ) 75 MCG tablet 1 pill every morning except Sundays   metoprolol  succinate (TOPROL -XL) 50 MG 24 hr tablet Take 50 mg by mouth daily.   omeprazole  (PRILOSEC) 40 MG capsule TAKE 1 CAPSULE BY MOUTH EVERY MORNING AND AT BEDTIME   rosuvastatin  (CRESTOR ) 40 MG tablet TAKE 1 TABLET BY MOUTH EVERY DAY   silver  sulfADIAZINE  (SILVADENE ) 1 % cream Apply 1 Application topically daily.   sitaGLIPtin  (JANUVIA ) 50 MG tablet Take 1 tablet (50 mg total) by mouth daily.    spironolactone  (ALDACTONE ) 25 MG tablet Take 0.5 tablets (12.5 mg total) by mouth daily.   hydrALAZINE  (APRESOLINE ) 50 MG tablet Take 50 mg by mouth 2 (two) times daily. (Patient not taking: Reported on 10/03/2023)   No facility-administered encounter medications on file as of 10/03/2023.    Allergies (verified) Shellfish allergy   History: Past Medical History:  Diagnosis Date   Arthritis    Asthma    no inhaler   Atrial fibrillation (HCC) 10/16/2020   CKD (chronic kidney disease) 04/13/2020   Coronary artery disease    cardiologist-  dr spruill; last visit 3 mos ago per pt   Enterobacter sepsis (HCC) 04/13/2020   GERD (gastroesophageal reflux disease)    History of MI (myocardial infarction)    1985   Hydronephrosis, left    Hypertension    Myocardial infarction (HCC)    Nephrolithiasis    left   Neuromuscular disorder (HCC)    TINGLING IN BOTH HANDS   Presence of tooth-root and mandibular implants    lower dental implants   Prostate cancer (HCC)    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   QT prolongation 10/16/2020   Shortness of breath    WITH EXERTION   Thigh abscess 04/13/2020   Type 2 diabetes mellitus (HCC)    Vaccine counseling 04/28/2022   Zoster 09/13/2021   Past Surgical History:  Procedure Laterality Date   BUNIONECTOMY     CYSTOSCOPY W/ RETROGRADES Left 03/14/2014   Procedure: CYSTOSCOPY WITH LEFT RETROGRADE PYELOGRAM, Left Ureteroscopy, Lweft ureteral Stent No string;  Surgeon: Oliva VEAR Oiler, MD;  Location: [facility] [place];  Service: Urology;  Laterality: Left;   CYSTOSCOPY W/ URETERAL STENT PLACEMENT Bilateral 01/15/2020   Procedure: CYSTOSCOPY WITH RETROGRADE PYELOGRAM/URETERAL STENT PLACEMENT;  Surgeon: Alvaro Hummer, MD;  Location: [facility];  Service: Urology;  Laterality: Bilateral;   CYSTOSCOPY/URETEROSCOPY/HOLMIUM LASER/STENT PLACEMENT Bilateral 03/17/2020   Procedure: CYSTOSCOPY/URETEROSCOPY/HOLMIUM LASER/STENT LEFT PLACEMENT;   Surgeon: Nieves Cough, MD;  Location: [facility];  Service: Urology;  Laterality: Bilateral;   HERNIA REPAIR     INCISION AND DRAINAGE Left 03/09/2020   Procedure: INCISION AND DRAINAGE LEFT HIP WITH LINER EXCHANGE;  Surgeon: Melodi Lerner, MD;  Location: [facility];  Service: Orthopedics;  Laterality: Left;   IR FLUORO GUIDE CV LINE RIGHT  03/10/2020   IR RADIOLOGIST EVAL & MGMT  04/01/2020   IR RADIOLOGIST EVAL & MGMT  04/15/2020   IR RADIOLOGIST EVAL & MGMT  04/30/2020   IR REMOVAL TUN CV CATH W/O FL  04/22/2020   IR US  GUIDE BX ASP/DRAIN  03/17/2020   IR US  GUIDE VASC ACCESS RIGHT  03/10/2020   PROSTATE BIOPSY     TOTAL HIP ARTHROPLASTY Left 03-20-2009   TOTAL HIP ARTHROPLASTY  04/09/2012   Procedure: TOTAL HIP ARTHROPLASTY;  Surgeon: Lerner LULLA Melodi, MD;  Location: [facility];  Service: Orthopedics;  Laterality: Right;   TOTAL KNEE  ARTHROPLASTY Left 05-16-2008   Family History  Problem Relation Age of Onset   Cancer Mother        breast   Multiple sclerosis Daughter    Cancer Father        prostate   Diabetes Father    Cancer Maternal Uncle        bone   Social History   Socioeconomic History   Marital status: Widowed    Spouse name: Not on file   Number of children: 5   Years of education: 9   Highest education level: 9th grade  Occupational History   Occupation: Retired  Tobacco Use   Smoking status: Former    Current packs/day: 0.00    Average packs/day: 1 pack/day for 25.0 years (25.0 ttl pk-yrs)    Types: Cigarettes    Start date: 04/12/1959    Quit date: 04/11/1984    Years since quitting: 39.5   Smokeless tobacco: Former    Quit date: 04/02/1985  Vaping Use   Vaping status: Never Used  Substance and Sexual Activity   Alcohol use: Not Currently    Comment: RARE   Drug use: No   Sexual activity: Not Currently  Other Topics Concern   Not on file  Social History Narrative   Lives alone. He had five children and one has deceased. He enjoys watching television.    Social Drivers of Corporate investment banker Strain: Low Risk  (10/03/2023)   Overall Financial Resource Strain (CARDIA)    Difficulty of Paying Living Expenses: Not hard at all  Food Insecurity: No Food Insecurity (10/03/2023)   Hunger Vital Sign    Worried About Running Out of Food in the Last Year: Never true    Ran Out of Food in the Last Year: Never true  Transportation Needs: No Transportation Needs (10/03/2023)   PRAPARE - Administrator, Civil Service (Medical): No    Lack of Transportation (Non-Medical): No  Physical Activity: Sufficiently Active (10/03/2023)   Exercise Vital Sign    Days of Exercise per Week: 4 days    Minutes of Exercise per Session: 60 min  Stress: No Stress Concern Present (10/03/2023)   [employer] of Occupational Health - Occupational Stress Questionnaire    Feeling of Stress: Not at all  Social Connections: Moderately Integrated (10/03/2023)   Social Connection and Isolation Panel    Frequency of Communication with Friends and Family: More than three times a week    Frequency of Social Gatherings with Friends and Family: More than three times a week    Attends Religious Services: More than 4 times per year    Active Member of [employer] or Organizations: Yes    Attends Banker Meetings: More than 4 times per year    Marital Status: Widowed    Tobacco Counseling Counseling given: Not Answered   Clinical Intake:  Pre-visit preparation completed: Yes  Pain : No/denies pain     BMI - recorded: 25.86 Nutritional Status: BMI 25 -29 Overweight Nutritional Risks: None Diabetes: Yes CBG done?: Yes (110) CBG resulted in Enter/ Edit results?: No Did pt. [name] in CBG monitor from home?: No  How often do you need to have someone help you when you read instructions, pamphlets, or other written materials from your doctor or pharmacy?: 1 - Never What is the last grade level you completed in school?: 9  Interpreter Needed?:  No      Activities of Daily Living  10/03/2023    8:57 AM  In your present state of health, do you have any difficulty performing the following activities:  Hearing? 0  Vision? 1  Difficulty concentrating or making decisions? 1  Walking or climbing stairs? 1  Dressing or bathing? 0  Doing errands, shopping? 0  Preparing Food and eating ? N  Using the Toilet? N  In the past six months, have you accidently leaked urine? Y  Do you have problems with loss of bowel control? N  Managing your Medications? N  Managing your Finances? N  Housekeeping or managing your Housekeeping? N    Patient Care Team: Aaron Bring, DO as PCP - General (Family Medicine) Levern Hutching, MD as Consulting Physician (Cardiology) Deanna Channing LABOR, [facility] (Pharmacist) Magdalen Pasco RAMAN, [name] as Consulting Physician (Podiatry) Elner Arley LABOR, MD as Consulting Physician (Ophthalmology) Dartha Ernst, MD as Consulting Physician (Endocrinology) Macel Jayson PARAS, MD as Consulting Physician (Internal Medicine) Fleeta Rothman, Jomarie SAILOR, MD as Consulting Physician (Infectious Diseases)  Indicate any recent Medical Services you may have received from other than [facility] providers in the past year (date may be approximate).     Assessment:   This is a routine wellness examination for [name].  Hearing/Vision screen No results found.   Goals Addressed             This Visit's Progress    Patient Stated       Patient states he would like to maintain a healthy lifestyle.        Depression Screen    10/03/2023    9:07 AM 07/11/2023    9:30 AM 02/03/2023   10:21 AM 11/08/2022    8:34 AM 09/27/2022    8:28 AM 04/28/2022    8:30 AM 12/17/2021    2:43 PM  PHQ 2/9 Scores  PHQ - 2 Score 0 4 1 0 0 0 0  PHQ- 9 Score  9         Fall Risk    10/03/2023    9:09 AM 07/11/2023    9:30 AM 02/03/2023   10:21 AM 11/08/2022    8:34 AM 09/27/2022    8:27 AM  Fall Risk   Falls in the past year? 1 0 1 1 1   Number falls  in past yr: 0 0 1 1 0  Injury with Fall? 0 0 1 0 0  Risk for fall due to : Impaired mobility No Fall Risks Impaired balance/gait History of fall(s);Impaired balance/gait History of fall(s);Impaired balance/gait  Follow up Falls evaluation completed Falls evaluation completed Falls evaluation completed Falls evaluation completed Falls evaluation completed;Education provided;Falls prevention discussed    MEDICARE RISK AT HOME: Medicare Risk at Home Any stairs in or around the home?: Yes If so, are there any without handrails?: Yes Home free of loose throw rugs in walkways, pet beds, electrical cords, etc?: Yes Adequate lighting in your home to reduce risk of falls?: Yes Life alert?: No Use of a cane, walker or w/c?: Yes Grab bars in the bathroom?: Yes Shower chair or bench in shower?: Yes Elevated toilet seat or a handicapped toilet?: No  TIMED UP AND GO:  Was the test performed?  No    Cognitive Function:        10/03/2023    9:11 AM 09/27/2022    8:34 AM 09/21/2021    1:54 PM  6CIT Screen  What Year? 0 points 0 points 0 points  What month? 0 points 0 points 0 points  What time? 0 points 0 points 0 points  Count back from 20 0 points 0 points 0 points  Months in reverse 4 points 4 points 4 points  Repeat phrase 6 points 6 points 8 points  Total Score 10 points 10 points 12 points    Immunizations Immunization History  Administered Date(s) Administered   Fluad Quad(high Dose 65+) 01/22/2019, 01/21/2020, 03/10/2021   Fluad Trivalent(High Dose 65+) 02/03/2023   Influenza, High Dose Seasonal PF 02/03/2017, 04/02/2018   Moderna SARS-COV2 Booster Vaccination 03/18/2020   Moderna Sars-Covid-2 Vaccination 06/22/2019, 03/18/2020   Pfizer Covid-19 Vaccine Bivalent Booster 5y-11y 03/10/2021   Pfizer(Comirnaty)Fall Seasonal Vaccine 12 years and older 02/03/2023   Pneumococcal Conjugate-13 04/24/2019   Pneumococcal Polysaccharide-23 10/23/2018   Tdap 04/11/2015    TDAP status:  Up to date  Flu Vaccine status: Up to date  Pneumococcal vaccine status: Up to date  Covid-19 vaccine status: Information provided on how to obtain vaccines.   Qualifies for Shingles Vaccine? Yes   Zostavax completed No   Shingrix Completed?: No.    Education has been provided regarding the importance of this vaccine. Patient has been advised to call insurance company to determine out of pocket expense if they have not yet received this vaccine. Advised may also receive vaccine at local pharmacy or Health Dept. Verbalized acceptance and understanding.  Screening Tests Health Maintenance  Topic Date Due   Zoster Vaccines- Shingrix (1 of 2) Never done   FOOT EXAM  08/02/2023   COVID-19 Vaccine (4 - Mixed Product risk 2024-25 season) 08/04/2023   OPHTHALMOLOGY EXAM  10/06/2023   INFLUENZA VACCINE  11/10/2023   Diabetic kidney evaluation - Urine ACR  03/13/2024   HEMOGLOBIN A1C  03/23/2024   Diabetic kidney evaluation - eGFR measurement  06/06/2024   Medicare Annual Wellness (AWV)  10/02/2024   DTaP/Tdap/Td (2 - Td or Tdap) 04/10/2025   Pneumococcal Vaccine: 50+ Years  Completed   Hepatitis C Screening  Completed   Hepatitis B Vaccines  Aged Out   HPV VACCINES  Aged Out   Meningococcal B Vaccine  Aged Out   Colonoscopy  Discontinued    Health Maintenance  Health Maintenance Due  Topic Date Due   Zoster Vaccines- Shingrix (1 of 2) Never done   FOOT EXAM  08/02/2023   COVID-19 Vaccine (4 - Mixed Product risk 2024-25 season) 08/04/2023    Colorectal cancer screening: No longer required.   Lung Cancer Screening: (Low Dose CT Chest recommended if Age 21-80 years, 20 pack-year currently smoking OR have quit w/in 15years.) does not qualify.   Lung Cancer Screening Referral: n/a  Additional Screening:  Hepatitis C Screening: does not qualify; Completed 10/23/2018  Vision Screening: Recommended annual ophthalmology exams for early detection of glaucoma and other disorders of the  eye. Is the patient up to date with their annual eye exam?  Yes  Who is the provider or what is the name of the office in which the patient attends annual eye exams? Dr Elner  If pt is not established with a provider, would they like to be referred to a provider to establish care? N/a.   Dental Screening: Recommended annual dental exams for proper oral hygiene  Diabetic Foot Exam: Diabetic Foot Exam: Completed 08/02/2022  Community Resource Referral / Chronic Care Management: CRR required this visit?  No   CCM required this visit?  No     Plan:     I have personally reviewed and noted the following in the patient's  chart:   Medical and social history Use of alcohol, tobacco or illicit drugs  Current medications and supplements including opioid prescriptions. Patient is not currently taking opioid prescriptions. Functional ability and status Nutritional status Physical activity Advanced directives List of other physicians Hospitalizations, surgeries, and ER # 3 visits in previous 12 months Vitals Screenings to include cognitive, depression, and falls Referrals and appointments  In addition, I have reviewed and discussed with patient certain preventive protocols, quality metrics, and best practice recommendations. A written personalized care plan for preventive services as well as general preventive health recommendations were provided to patient.     Bonny Jon Mayor, CMA   10/03/2023   After Visit Summary: (MyChart) Due to this being a telephonic visit, the after visit summary with patients personalized plan was offered to patient via MyChart   Nurse Notes:   [name] is a 78 y.o. male patient of Aaron Bring, DO who had a Medicare Annual Wellness Visit today via telephone. [name] is Retired and lives alone. He has 4 living children. He reports that he is socially active and does interact with friends/family regularly. He is minimally physically active and  enjoys watching television and going to the [employer].

## 2023-10-11 ENCOUNTER — Encounter: Payer: Self-pay | Admitting: Podiatry

## 2023-10-11 ENCOUNTER — Ambulatory Visit: Admitting: Podiatry

## 2023-10-11 VITALS — Ht 71.0 in | Wt 196.0 lb

## 2023-10-11 DIAGNOSIS — M79674 Pain in right toe(s): Secondary | ICD-10-CM | POA: Diagnosis not present

## 2023-10-11 DIAGNOSIS — B351 Tinea unguium: Secondary | ICD-10-CM | POA: Diagnosis not present

## 2023-10-11 DIAGNOSIS — M79675 Pain in left toe(s): Secondary | ICD-10-CM

## 2023-10-11 NOTE — Progress Notes (Signed)
 Subjective:   Patient ID: [name], male   DOB: 78 y.o.   MRN: [id number]   HPI Patient presents with elongated nailbeds 1-5 both feet that are thick and painful and he cannot cut himself   ROS      Objective:  Physical Exam  Neurovascular status intact with thick yellow brittle nailbeds 1-5 both feet painful     Assessment:  Chronic mycotic nail infection 1-5 both feet     Plan:  Debride nailbeds 1-5 both feet iatrogenic bleeding reappoint routine care

## 2023-10-17 ENCOUNTER — Other Ambulatory Visit: Payer: Self-pay | Admitting: Family Medicine

## 2023-10-23 DIAGNOSIS — H33022 Retinal detachment with multiple breaks, left eye: Secondary | ICD-10-CM | POA: Diagnosis not present

## 2023-10-23 DIAGNOSIS — H35351 Cystoid macular degeneration, right eye: Secondary | ICD-10-CM | POA: Diagnosis not present

## 2023-10-23 DIAGNOSIS — H26491 Other secondary cataract, right eye: Secondary | ICD-10-CM | POA: Diagnosis not present

## 2023-10-23 DIAGNOSIS — H4323 Crystalline deposits in vitreous body, bilateral: Secondary | ICD-10-CM | POA: Diagnosis not present

## 2023-10-23 DIAGNOSIS — H35352 Cystoid macular degeneration, left eye: Secondary | ICD-10-CM | POA: Diagnosis not present

## 2023-10-23 DIAGNOSIS — H35373 Puckering of macula, bilateral: Secondary | ICD-10-CM | POA: Diagnosis not present

## 2023-11-15 DIAGNOSIS — H35371 Puckering of macula, right eye: Secondary | ICD-10-CM | POA: Diagnosis not present

## 2023-11-15 DIAGNOSIS — H26491 Other secondary cataract, right eye: Secondary | ICD-10-CM | POA: Diagnosis not present

## 2023-11-23 DIAGNOSIS — H33022 Retinal detachment with multiple breaks, left eye: Secondary | ICD-10-CM | POA: Diagnosis not present

## 2023-11-23 DIAGNOSIS — H35352 Cystoid macular degeneration, left eye: Secondary | ICD-10-CM | POA: Diagnosis not present

## 2023-11-23 DIAGNOSIS — H35371 Puckering of macula, right eye: Secondary | ICD-10-CM | POA: Diagnosis not present

## 2023-11-23 DIAGNOSIS — H26491 Other secondary cataract, right eye: Secondary | ICD-10-CM | POA: Diagnosis not present

## 2023-11-23 DIAGNOSIS — H35351 Cystoid macular degeneration, right eye: Secondary | ICD-10-CM | POA: Diagnosis not present

## 2023-11-23 DIAGNOSIS — H4323 Crystalline deposits in vitreous body, bilateral: Secondary | ICD-10-CM | POA: Diagnosis not present

## 2023-11-23 DIAGNOSIS — H35372 Puckering of macula, left eye: Secondary | ICD-10-CM | POA: Diagnosis not present

## 2023-12-04 NOTE — Progress Notes (Deleted)
 Subjective:  Chief complaint: Follow-up for prosthetic hip infection   Patient ID: [name], male    DOB: 03-02-1946, 78 y.o.   MRN: [id number]  HPI  78 year old Black man with PMHx of CAD, DM2, HTN, CKD, OA, s/p left hip arthoplasty (2001), prostate cancer, PAF, left ureteral stone s/p stenting (01/2020) who was sent from ortho clinic on 11/23 by Dr Melodi. He grew Enterobacter from blood, urine and hip aspirate. He is sp surgery with Irrigation and debridement, left hip, with bearing surface exchange. Enterobacter grew on cultures.  We had him scheduled to complete antibiotics on 10 January.  In the interim he developed a fluid collection in his thigh that was drained and seemed rather bloody but which also grew Enterobacter from culture.   He finished cefepime  and we started bactrim  DS BID he ran into problems with hyperkalemia and elevated creatinine also in the context of other potassium sparing and potassium raising medications  '  Interim narrative:   We obtained Nuzyra  PA though due to his deductible not having yet been met he would have to pay roughly $2k upfront.   He ended up doing that but then turned out that he need to pay $640 a month even though he had met that amount.   I then took [place] off he Nuzyra  because clinically seem d to be doing well and his CRP had normalized though his sed rate had not normalized I did come down from 140 into the 90s.  I also felt like continue him on this tetracycline would just be to cost prohibitive.  Unfortunately since he came off antibiotics he experienced recurrence of infection at his hip with increasing pain and also pus which began coming through the skin.  He was seen by Dr.  Hiram  who aspirated the area and sent for culture. Exact same Enterobacter grew yet again from culture in June of 2022.  After careful consideration we decided to start levofloxacin  despite risk of C. difficile colitis, Achilles tendinopathy  confusion problems with blood sugar control and QT prolongation in the context of him being on amiodarone  and having slight QT prolongation at baseline.  Since starting levofloxacin  he to be having no adverse effects from this.  He returned to clinic and we obtained a twelve-lead EKG.  If EKG shows normal sinus rhythm with heart first-degree AV block but no significant ST or T wave changes and a QT and QTc of 429 and 451 which was largely unchanged from his last EKG obtained.  1 time when I saw [name] is having some pruritus that he did to the levofloxacin .  He continues to have this but he is able to tolerate it.  I was told by triage staff that his levofloxacin  had been discontinued possibly by Dr. Kassie but I do not see mention of this in his note indeed he was not off levofloxacin .  His pain has been stable and seems to have been improved.  He did have a flare of zoster recently which caused him to have pain in his left lower extremity and also up in his hip.  This is improved however.  He does still have itching that is worse when he goes to bed or when he is winding down.  He previously very much would like to come off the antibiotics would like to push through 1 year from when it recurred which would be in July.  Returns to clinic now in July a year later.  We had further discussions about continuing antibiotics versus stopping them I think in my opinion it be prudent to continue him on them for now and revisit this in 6 months because I have a high suspicion he will have recurrence once he comes off.  He appears to be doing relatively well today his hip pain is stable he really only has it when he sits in his recliner and sounds when he gets up in the middle the night to go to the bathroom.  He had a right-sided knee effusion that continues to bother him but has responded to treatment with aspiration and injection of corticosteroids with Dr. Hiram.  He did have an episode of  transient confusion when he was driving his car and lost his way in [place] but I do not think this is related to levofloxacin  because it was transient   Discussed the use of AI scribe software for clinical note transcription with the patient, who gave verbal consent to proceed.  History of Present Illness                Past Medical History:  Diagnosis Date   Arthritis    Asthma    no inhaler   Atrial fibrillation (HCC) 10/16/2020   CKD (chronic kidney disease) 04/13/2020   Coronary artery disease    cardiologist-  dr spruill; last visit 3 mos ago per pt   Enterobacter sepsis (HCC) 04/13/2020   GERD (gastroesophageal reflux disease)    History of MI (myocardial infarction)    1985   Hydronephrosis, left    Hypertension    Myocardial infarction (HCC)    Nephrolithiasis    left   Neuromuscular disorder (HCC)    TINGLING IN BOTH HANDS   Presence of tooth-root and mandibular implants    lower dental implants   Prostate cancer (HCC)    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   QT prolongation 10/16/2020   Shortness of breath    WITH EXERTION   Thigh abscess 04/13/2020   Type 2 diabetes mellitus (HCC)    Vaccine counseling 04/28/2022   Zoster 09/13/2021    Past Surgical History:  Procedure Laterality Date   BUNIONECTOMY     CYSTOSCOPY W/ RETROGRADES Left 03/14/2014   Procedure: CYSTOSCOPY WITH LEFT RETROGRADE PYELOGRAM, Left Ureteroscopy, Lweft ureteral Stent No string;  Surgeon: Oliva VEAR Oiler, MD;  Location: [facility];  Service: Urology;  Laterality: Left;   CYSTOSCOPY W/ URETERAL STENT PLACEMENT Bilateral 01/15/2020   Procedure: CYSTOSCOPY WITH RETROGRADE PYELOGRAM/URETERAL STENT PLACEMENT;  Surgeon: Alvaro Hummer, MD;  Location: [facility];  Service: Urology;  Laterality: Bilateral;   CYSTOSCOPY/URETEROSCOPY/HOLMIUM LASER/STENT PLACEMENT Bilateral 03/17/2020   Procedure: CYSTOSCOPY/URETEROSCOPY/HOLMIUM LASER/STENT LEFT PLACEMENT;  Surgeon:  Nieves Cough, MD;  Location: [facility];  Service: Urology;  Laterality: Bilateral;   HERNIA REPAIR     INCISION AND DRAINAGE Left 03/09/2020   Procedure: INCISION AND DRAINAGE LEFT HIP WITH LINER EXCHANGE;  Surgeon: Melodi Lerner, MD;  Location: [facility];  Service: Orthopedics;  Laterality: Left;   IR FLUORO GUIDE CV LINE RIGHT  03/10/2020   IR RADIOLOGIST EVAL & MGMT  04/01/2020   IR RADIOLOGIST EVAL & MGMT  04/15/2020   IR RADIOLOGIST EVAL & MGMT  04/30/2020   IR REMOVAL TUN CV CATH W/O FL  04/22/2020   IR US  GUIDE BX ASP/DRAIN  03/17/2020   IR US  GUIDE VASC ACCESS RIGHT  03/10/2020   PROSTATE BIOPSY     TOTAL HIP ARTHROPLASTY Left 03-20-2009  TOTAL HIP ARTHROPLASTY  04/09/2012   Procedure: TOTAL HIP ARTHROPLASTY;  Surgeon: Dempsey LULLA Moan, MD;  Location: [facility];  Service: Orthopedics;  Laterality: Right;   TOTAL KNEE ARTHROPLASTY Left 05-16-2008    Family History  Problem Relation Age of Onset   Cancer Mother        breast   Multiple sclerosis Daughter    Cancer Father        prostate   Diabetes Father    Cancer Maternal Uncle        bone      Social History   Socioeconomic History   Marital status: Widowed    Spouse name: Not on file   Number of children: 5   Years of education: 9   Highest education level: 9th grade  Occupational History   Occupation: Retired  Tobacco Use   Smoking status: Former    Current packs/day: 0.00    Average packs/day: 1 pack/day for 25.0 years (25.0 ttl pk-yrs)    Types: Cigarettes    Start date: 04/12/1959    Quit date: 04/11/1984    Years since quitting: 39.6   Smokeless tobacco: Former    Quit date: 04/02/1985  Vaping Use   Vaping status: Never Used  Substance and Sexual Activity   Alcohol use: Not Currently    Comment: RARE   Drug use: No   Sexual activity: Not Currently  Other Topics Concern   Not on file  Social History Narrative   Lives alone. He had five children and one has deceased. He enjoys watching television.    Social Drivers of Corporate investment banker Strain: Low Risk  (10/03/2023)   Overall Financial Resource Strain (CARDIA)    Difficulty of Paying Living Expenses: Not hard at all  Food Insecurity: No Food Insecurity (10/03/2023)   Hunger Vital Sign    Worried About Running Out of Food in the Last Year: Never true    Ran Out of Food in the Last Year: Never true  Transportation Needs: No Transportation Needs (10/03/2023)   PRAPARE - Administrator, Civil Service (Medical): No    Lack of Transportation (Non-Medical): No  Physical Activity: Sufficiently Active (10/03/2023)   Exercise Vital Sign    Days of Exercise per Week: 4 days    Minutes of Exercise per Session: 60 min  Stress: No Stress Concern Present (10/03/2023)   [employer] of Occupational Health - Occupational Stress Questionnaire    Feeling of Stress: Not at all  Social Connections: Moderately Integrated (10/03/2023)   Social Connection and Isolation Panel    Frequency of Communication with Friends and Family: More than three times a week    Frequency of Social Gatherings with Friends and Family: More than three times a week    Attends Religious Services: More than 4 times per year    Active Member of [employer] or Organizations: Yes    Attends Banker Meetings: More than 4 times per year    Marital Status: Widowed    Allergies  Allergen Reactions   Shellfish Allergy Rash     Current Outpatient Medications:    albuterol  (VENTOLIN  HFA) 108 (90 Base) MCG/ACT inhaler, Inhale 2 puffs into the lungs every 6 (six) hours as needed for wheezing or shortness of breath., Disp: 8 g, Rfl: 0   allopurinol  (ZYLOPRIM ) 100 MG tablet, TAKE 1/2 TABLET BY MOUTH DAILY, Disp: 45 tablet, Rfl: 1   AMBULATORY NON FORMULARY MEDICATION, Please provide rollator walker  with hand brakes and seat.  Dx: R26.81 Gait instability, Disp: 1 Device, Rfl: 0   amiodarone  (PACERONE ) 200 MG tablet, Take 200 mg by mouth daily. Dr.  Levern, Disp: , Rfl:    amLODipine  (NORVASC ) 10 MG tablet, TAKE 1 TABLET BY MOUTH EVERY DAY, Disp: 90 tablet, Rfl: 1   aspirin  EC 81 MG tablet, Take 81 mg by mouth daily. Swallow whole., Disp: , Rfl:    chlorthalidone (HYGROTON) 25 MG tablet, Take 25 mg by mouth 3 (three) times a week., Disp: , Rfl:    colchicine  0.6 MG tablet, Take 1 tablet (0.6 mg total) by mouth daily. Please bill discount card vs insurance- BIN [phone], PCN [id number], Group [id number], Member ID [id number], Disp: 30 tablet, Rfl: 12   Continuous Glucose Sensor (FREESTYLE LIBRE 3 PLUS SENSOR) MISC, CHANGE SENSOR EVERY 15 DAYS, Disp: 2 each, Rfl: 3   docusate sodium  (COLACE) 100 MG capsule, Take 100 mg by mouth daily as needed for mild constipation., Disp: , Rfl:    empagliflozin  (JARDIANCE ) 25 MG TABS tablet, Take 1 tablet (25 mg total) by mouth daily before breakfast., Disp: 90 tablet, Rfl: 1   ferrous sulfate  325 (65 FE) MG tablet, Take 325 mg by mouth daily with breakfast., Disp: , Rfl:    furosemide  (LASIX ) 20 MG tablet, Take 1 tablet (20 mg total) by mouth daily., Disp: 90 tablet, Rfl: 3   glipiZIDE  2.5 MG TABS, Take 1 tablet by mouth as needed (for blood sugar >200)., Disp: 30 tablet, Rfl: 3   hydrALAZINE  (APRESOLINE ) 50 MG tablet, Take 50 mg by mouth 2 (two) times daily., Disp: , Rfl:    levofloxacin  (LEVAQUIN ) 500 MG tablet, Take 1 tablet by mouth daily., Disp: 30 tablet, Rfl: 11   levothyroxine  (SYNTHROID ) 75 MCG tablet, 1 pill every morning except Sundays, Disp: 90 tablet, Rfl: 1   metoprolol  succinate (TOPROL -XL) 50 MG 24 hr tablet, Take 50 mg by mouth daily., Disp: , Rfl:    omeprazole  (PRILOSEC) 40 MG capsule, TAKE 1 CAPSULE BY MOUTH EVERY MORNING AND AT BEDTIME, Disp: 180 capsule, Rfl: 0   rosuvastatin  (CRESTOR ) 40 MG tablet, TAKE 1 TABLET BY MOUTH EVERY DAY, Disp: 90 tablet, Rfl: 3   silver  sulfADIAZINE  (SILVADENE ) 1 % cream, Apply 1 Application topically daily., Disp: 50 g, Rfl: 0   sitaGLIPtin  (JANUVIA ) 50 MG tablet, Take 1  tablet (50 mg total) by mouth daily., Disp: 90 tablet, Rfl: 1   spironolactone  (ALDACTONE ) 25 MG tablet, Take 0.5 tablets (12.5 mg total) by mouth daily., Disp: 45 tablet, Rfl: 3   Review of Systems  Constitutional:  Negative for activity change, appetite change, chills, diaphoresis, fatigue, fever and unexpected weight change.  HENT:  Negative for congestion, rhinorrhea, sinus pressure, sneezing, sore throat and trouble swallowing.   Eyes:  Negative for photophobia and visual disturbance.  Respiratory:  Negative for cough, chest tightness, shortness of breath, wheezing and stridor.   Cardiovascular:  Negative for chest pain, palpitations and leg swelling.  Gastrointestinal:  Negative for abdominal distention, abdominal pain, anal bleeding, blood in stool, constipation, diarrhea, nausea and vomiting.  Genitourinary:  Negative for difficulty urinating, dysuria, flank pain and hematuria.  Musculoskeletal:  Positive for arthralgias and joint swelling. Negative for back pain, gait problem and myalgias.  Skin:  Negative for color change, pallor, rash and wound.  Neurological:  Negative for dizziness, tremors, weakness and light-headedness.  Hematological:  Negative for adenopathy. Does not bruise/bleed easily.  Psychiatric/Behavioral:  Negative for agitation, behavioral problems, confusion,  decreased concentration, dysphoric mood and sleep disturbance.        Objective:   Physical Exam Constitutional:      Appearance: He is well-developed.  HENT:     Head: Normocephalic and atraumatic.  Eyes:     Conjunctiva/sclera: Conjunctivae normal.  Cardiovascular:     Rate and Rhythm: Normal rate and regular rhythm.  Pulmonary:     Effort: Pulmonary effort is normal. No respiratory distress.     Breath sounds: No wheezing.  Abdominal:     General: There is no distension.     Palpations: Abdomen is soft.  Musculoskeletal:        General: No tenderness. Normal range of motion.     Cervical back:  Normal range of motion and neck supple.  Skin:    General: Skin is warm and dry.     Coloration: Skin is not pale.     Findings: No erythema or rash.  Neurological:     General: No focal deficit present.     Mental Status: He is alert and oriented to person, place, and time.  Psychiatric:        Mood and Affect: Mood normal.        Behavior: Behavior normal.        Thought Content: Thought content normal.        Judgment: Judgment normal.     He has what appears to be a lipoma on wrist. Seems a bit too proximal on arm to be a ganglion cyst       Assessment & Plan:     Assessment and Plan

## 2023-12-05 ENCOUNTER — Ambulatory Visit: Payer: PPO | Admitting: Infectious Disease

## 2023-12-05 DIAGNOSIS — N1832 Chronic kidney disease, stage 3b: Secondary | ICD-10-CM

## 2023-12-05 DIAGNOSIS — I159 Secondary hypertension, unspecified: Secondary | ICD-10-CM

## 2023-12-05 DIAGNOSIS — Z96649 Presence of unspecified artificial hip joint: Secondary | ICD-10-CM

## 2024-01-06 ENCOUNTER — Other Ambulatory Visit (HOSPITAL_BASED_OUTPATIENT_CLINIC_OR_DEPARTMENT_OTHER): Payer: Self-pay | Admitting: Family

## 2024-01-06 DIAGNOSIS — I10 Essential (primary) hypertension: Secondary | ICD-10-CM

## 2024-01-10 ENCOUNTER — Encounter: Payer: Self-pay | Admitting: Family Medicine

## 2024-01-10 ENCOUNTER — Ambulatory Visit: Admitting: Family Medicine

## 2024-01-10 VITALS — BP 134/56 | HR 56 | Ht 71.0 in | Wt 175.0 lb

## 2024-01-10 DIAGNOSIS — N1832 Chronic kidney disease, stage 3b: Secondary | ICD-10-CM | POA: Diagnosis not present

## 2024-01-10 DIAGNOSIS — Z7984 Long term (current) use of oral hypoglycemic drugs: Secondary | ICD-10-CM | POA: Diagnosis not present

## 2024-01-10 DIAGNOSIS — I159 Secondary hypertension, unspecified: Secondary | ICD-10-CM

## 2024-01-10 DIAGNOSIS — L918 Other hypertrophic disorders of the skin: Secondary | ICD-10-CM | POA: Insufficient documentation

## 2024-01-10 DIAGNOSIS — E032 Hypothyroidism due to medicaments and other exogenous substances: Secondary | ICD-10-CM

## 2024-01-10 DIAGNOSIS — E1122 Type 2 diabetes mellitus with diabetic chronic kidney disease: Secondary | ICD-10-CM | POA: Diagnosis not present

## 2024-01-10 DIAGNOSIS — E1169 Type 2 diabetes mellitus with other specified complication: Secondary | ICD-10-CM

## 2024-01-10 DIAGNOSIS — E785 Hyperlipidemia, unspecified: Secondary | ICD-10-CM

## 2024-01-10 DIAGNOSIS — L02419 Cutaneous abscess of limb, unspecified: Secondary | ICD-10-CM

## 2024-01-10 DIAGNOSIS — I4891 Unspecified atrial fibrillation: Secondary | ICD-10-CM

## 2024-01-10 DIAGNOSIS — E039 Hypothyroidism, unspecified: Secondary | ICD-10-CM | POA: Insufficient documentation

## 2024-01-10 NOTE — Assessment & Plan Note (Signed)
Continue on amiodarone.  Managed by cardiology.

## 2024-01-10 NOTE — Assessment & Plan Note (Signed)
Tolerating atorvastatin well, continue at current strength.   

## 2024-01-10 NOTE — Assessment & Plan Note (Signed)
 2/2 to amiodarone  use.  He is not taking. Discussed that last note said to continue but skip Sunday.  He will check to see if he still has this at home.

## 2024-01-10 NOTE — Assessment & Plan Note (Addendum)
 Bpi is well controlled.  Continue current medications for management of HTN.

## 2024-01-10 NOTE — Assessment & Plan Note (Signed)
 Skin tag of R ear treated with liquid nitrogen today.

## 2024-01-10 NOTE — Assessment & Plan Note (Signed)
 Continues to see ID.  He remains on chronic antibiotics.

## 2024-01-10 NOTE — Progress Notes (Signed)
 [name] - 78 y.o. male MRN [id number]  Date of birth: 11-12-1945  Subjective Chief Complaint  Patient presents with   Hypertension    HPI [name] is a 78 y.o. male here today for follow up visit.   He reports that he is doing pretty well.SABRA   He continues on amlodipine , chlorthalidone, metoprolol , hydralazine  and aldactone .  He is doing well with thiese.  He denies side effects at this time.  He does remain on amiodarone  for management of his A. Fib.  This is managed by cardiology.  He has not had chest pain, shortness of breath, palpitations, headache or vision changes.   He does continue to see endocrinology for management of diabetes and hypothyroidism.  He is doing pretty well with current medications for management of diabetes.  His last A1c was 6.8%.  He reports that thyroid  medication was discontinued as his free t4 levels were elevated.   Followed by ID.  He does continue on chronic antibiotics.  Denies new or worsening pain in the hip.   He has right ear skin tag that is bothering him.  It has gotten a little larger.  He denies bleeding.   ROS:  A comprehensive ROS was completed and negative except as noted per HPI  Allergies  Allergen Reactions   Shellfish Allergy Rash    Past Medical History:  Diagnosis Date   Arthritis    Asthma    no inhaler   Atrial fibrillation (HCC) 10/16/2020   CKD (chronic kidney disease) 04/13/2020   Coronary artery disease    cardiologist-  dr spruill; last visit 3 mos ago per pt   Enterobacter sepsis (HCC) 04/13/2020   GERD (gastroesophageal reflux disease)    History of MI (myocardial infarction)    1985   Hydronephrosis, left    Hypertension    Myocardial infarction (HCC)    Nephrolithiasis    left   Neuromuscular disorder (HCC)    TINGLING IN BOTH HANDS   Presence of tooth-root and mandibular implants    lower dental implants   Prostate cancer (HCC)    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   QT prolongation  10/16/2020   Shortness of breath    WITH EXERTION   Thigh abscess 04/13/2020   Type 2 diabetes mellitus (HCC)    Vaccine counseling 04/28/2022   Zoster 09/13/2021    Past Surgical History:  Procedure Laterality Date   BUNIONECTOMY     CYSTOSCOPY W/ RETROGRADES Left 03/14/2014   Procedure: CYSTOSCOPY WITH LEFT RETROGRADE PYELOGRAM, Left Ureteroscopy, Lweft ureteral Stent No string;  Surgeon: Oliva VEAR Oiler, MD;  Location: [facility];  Service: Urology;  Laterality: Left;   CYSTOSCOPY W/ URETERAL STENT PLACEMENT Bilateral 01/15/2020   Procedure: CYSTOSCOPY WITH RETROGRADE PYELOGRAM/URETERAL STENT PLACEMENT;  Surgeon: Alvaro Hummer, MD;  Location: [facility];  Service: Urology;  Laterality: Bilateral;   CYSTOSCOPY/URETEROSCOPY/HOLMIUM LASER/STENT PLACEMENT Bilateral 03/17/2020   Procedure: CYSTOSCOPY/URETEROSCOPY/HOLMIUM LASER/STENT LEFT PLACEMENT;  Surgeon: Nieves Cough, MD;  Location: [facility];  Service: Urology;  Laterality: Bilateral;   HERNIA REPAIR     INCISION AND DRAINAGE Left 03/09/2020   Procedure: INCISION AND DRAINAGE LEFT HIP WITH LINER EXCHANGE;  Surgeon: Melodi Lerner, MD;  Location: [facility];  Service: Orthopedics;  Laterality: Left;   IR FLUORO GUIDE CV LINE RIGHT  03/10/2020   IR RADIOLOGIST EVAL & MGMT  04/01/2020   IR RADIOLOGIST EVAL & MGMT  04/15/2020   IR RADIOLOGIST EVAL & MGMT  04/30/2020  IR REMOVAL TUN CV CATH W/O FL  04/22/2020   IR US  GUIDE BX ASP/DRAIN  03/17/2020   IR US  GUIDE VASC ACCESS RIGHT  03/10/2020   PROSTATE BIOPSY     TOTAL HIP ARTHROPLASTY Left 03-20-2009   TOTAL HIP ARTHROPLASTY  04/09/2012   Procedure: TOTAL HIP ARTHROPLASTY;  Surgeon: Dempsey LULLA Moan, MD;  Location: [facility];  Service: Orthopedics;  Laterality: Right;   TOTAL KNEE ARTHROPLASTY Left 05-16-2008    Social History   Socioeconomic History   Marital status: Widowed    Spouse name: Not on file   Number of children: 5   Years of education: 9   Highest education level:  9th grade  Occupational History   Occupation: Retired  Tobacco Use   Smoking status: Former    Current packs/day: 0.00    Average packs/day: 1 pack/day for 25.0 years (25.0 ttl pk-yrs)    Types: Cigarettes    Start date: 04/12/1959    Quit date: 04/11/1984    Years since quitting: 39.7   Smokeless tobacco: Former    Quit date: 04/02/1985  Vaping Use   Vaping status: Never Used  Substance and Sexual Activity   Alcohol use: Not Currently    Comment: RARE   Drug use: No   Sexual activity: Not Currently  Other Topics Concern   Not on file  Social History Narrative   Lives alone. He had five children and one has deceased. He enjoys watching television.   Social Drivers of Corporate investment banker Strain: Low Risk  (10/03/2023)   Overall Financial Resource Strain (CARDIA)    Difficulty of Paying Living Expenses: Not hard at all  Food Insecurity: No Food Insecurity (10/03/2023)   Hunger Vital Sign    Worried About Running Out of Food in the Last Year: Never true    Ran Out of Food in the Last Year: Never true  Transportation Needs: No Transportation Needs (10/03/2023)   PRAPARE - Administrator, Civil Service (Medical): No    Lack of Transportation (Non-Medical): No  Physical Activity: Sufficiently Active (10/03/2023)   Exercise Vital Sign    Days of Exercise per Week: 4 days    Minutes of Exercise per Session: 60 min  Stress: No Stress Concern Present (10/03/2023)   [employer] of Occupational Health - Occupational Stress Questionnaire    Feeling of Stress: Not at all  Social Connections: Moderately Integrated (10/03/2023)   Social Connection and Isolation Panel    Frequency of Communication with Friends and Family: More than three times a week    Frequency of Social Gatherings with Friends and Family: More than three times a week    Attends Religious Services: More than 4 times per year    Active Member of [employer] or Organizations: Yes    Attends Tax inspector Meetings: More than 4 times per year    Marital Status: Widowed    Family History  Problem Relation Age of Onset   Cancer Mother        breast   Multiple sclerosis Daughter    Cancer Father        prostate   Diabetes Father    Cancer Maternal Uncle        bone    Health Maintenance  Topic Date Due   FOOT EXAM  08/02/2023   Influenza Vaccine  11/10/2023   COVID-19 Vaccine (4 - Mixed Product risk 2024-25 season) 12/11/2023   Zoster Vaccines- Shingrix (1  of 2) 04/11/2024 (Originally 08/05/1964)   Diabetic kidney evaluation - Urine ACR  03/13/2024   HEMOGLOBIN A1C  03/23/2024   Diabetic kidney evaluation - eGFR measurement  06/06/2024   Medicare Annual Wellness (AWV)  10/02/2024   OPHTHALMOLOGY EXAM  10/22/2024   DTaP/Tdap/Td (3 - Td or Tdap) 09/05/2033   Pneumococcal Vaccine: 50+ Years  Completed   Hepatitis C Screening  Completed   HPV VACCINES  Aged Out   Meningococcal B Vaccine  Aged Out   Colonoscopy  Discontinued     ----------------------------------------------------------------------------------------------------------------------------------------------------------------------------------------------------------------- Physical Exam BP (!) 134/56 (BP Location: Left Arm, Patient Position: Sitting, Cuff Size: Normal)   Pulse (!) 56   Ht 5' 11 (1.803 m)   Wt 175 lb (79.4 kg)   SpO2 100%   BMI 24.41 kg/m   Physical Exam Constitutional:      Appearance: Normal appearance.  Eyes:     General: No scleral icterus. Cardiovascular:     Rate and Rhythm: Normal rate and regular rhythm.  Pulmonary:     Effort: Pulmonary effort is normal.     Breath sounds: Normal breath sounds.  Musculoskeletal:     Cervical back: Neck supple.  Neurological:     General: No focal deficit present.     Mental Status: He is alert.  Psychiatric:        Mood and Affect: Mood normal.        Behavior: Behavior normal.      ------------------------------------------------------------------------------------------------------------------------------------------------------------------------------------------------------------------- Assessment and Plan  Type 2 diabetes mellitus with chronic kidney disease (HCC) Well controlled.  No episodes of hypoglycemia. Managed by endocrinology.   HTN (hypertension) Bpi is well controlled.  Continue current medications for management of HTN.    Hyperlipidemia associated with type 2 diabetes mellitus (HCC) Tolerating atorvastatin  well, continue at current strength.   Atrial fibrillation (HCC) Continue on amiodarone .  Managed by cardiology.    [id number] Renal function remains stable. He does see nephrology.   Thigh abscess Continues to see ID.  He remains on chronic antibiotics.   Skin tag Skin tag of R ear treated with liquid nitrogen today.   Hypothyroid 2/2 to amiodarone  use.  He is not taking. Discussed that last note said to continue but skip Sunday.  He will check to see if he still has this at home.    No orders of the defined types were placed in this encounter.   No follow-ups on file.

## 2024-01-10 NOTE — Assessment & Plan Note (Signed)
 Renal function remains stable. He does see nephrology.

## 2024-01-10 NOTE — Assessment & Plan Note (Signed)
 Well controlled.  No episodes of hypoglycemia. Managed by endocrinology.

## 2024-01-25 ENCOUNTER — Telehealth: Payer: Self-pay

## 2024-01-25 NOTE — Telephone Encounter (Signed)
 Patient came in requesting medication refill for Levofloxacin  (Lefaquin) to be sent to the Pharmacy: [id number] - [place], [place] - [address] , best contact number is [phone]

## 2024-01-25 NOTE — Telephone Encounter (Signed)
 Per chart review pt still have refill at pharmacy.

## 2024-01-25 NOTE — Telephone Encounter (Signed)
 Left vm informing pt that pharmacy still has refills. Requested he follow up with [facility]. Lorenda CHRISTELLA Code, RMA

## 2024-02-01 DIAGNOSIS — H35371 Puckering of macula, right eye: Secondary | ICD-10-CM | POA: Diagnosis not present

## 2024-02-01 DIAGNOSIS — H35351 Cystoid macular degeneration, right eye: Secondary | ICD-10-CM | POA: Diagnosis not present

## 2024-02-01 DIAGNOSIS — H35352 Cystoid macular degeneration, left eye: Secondary | ICD-10-CM | POA: Diagnosis not present

## 2024-02-03 ENCOUNTER — Other Ambulatory Visit: Payer: Self-pay | Admitting: Family Medicine

## 2024-02-03 DIAGNOSIS — I159 Secondary hypertension, unspecified: Secondary | ICD-10-CM

## 2024-02-27 DIAGNOSIS — H33022 Retinal detachment with multiple breaks, left eye: Secondary | ICD-10-CM | POA: Diagnosis not present

## 2024-02-27 DIAGNOSIS — H35371 Puckering of macula, right eye: Secondary | ICD-10-CM | POA: Diagnosis not present

## 2024-02-27 DIAGNOSIS — H26491 Other secondary cataract, right eye: Secondary | ICD-10-CM | POA: Diagnosis not present

## 2024-02-27 DIAGNOSIS — H35352 Cystoid macular degeneration, left eye: Secondary | ICD-10-CM | POA: Diagnosis not present

## 2024-02-27 DIAGNOSIS — H4323 Crystalline deposits in vitreous body, bilateral: Secondary | ICD-10-CM | POA: Diagnosis not present

## 2024-02-27 DIAGNOSIS — H35351 Cystoid macular degeneration, right eye: Secondary | ICD-10-CM | POA: Diagnosis not present

## 2024-02-27 DIAGNOSIS — H35372 Puckering of macula, left eye: Secondary | ICD-10-CM | POA: Diagnosis not present

## 2024-03-12 ENCOUNTER — Other Ambulatory Visit: Payer: Self-pay | Admitting: Family Medicine

## 2024-03-13 ENCOUNTER — Other Ambulatory Visit (HOSPITAL_BASED_OUTPATIENT_CLINIC_OR_DEPARTMENT_OTHER): Payer: Self-pay | Admitting: Family

## 2024-03-13 ENCOUNTER — Other Ambulatory Visit: Payer: Self-pay | Admitting: Family Medicine

## 2024-03-13 DIAGNOSIS — I10 Essential (primary) hypertension: Secondary | ICD-10-CM

## 2024-03-20 ENCOUNTER — Other Ambulatory Visit

## 2024-03-25 ENCOUNTER — Ambulatory Visit: Admitting: "Endocrinology

## 2024-03-25 ENCOUNTER — Other Ambulatory Visit: Payer: Self-pay | Admitting: Family Medicine

## 2024-03-31 NOTE — Progress Notes (Unsigned)
 "  Subjective:  Chief complaint: Follow-up for prosthetic hip infection   Patient ID: [name], male    DOB: [birth year], 78 y.o.   MRN: [id number]  HPI  78 year old Black man with PMHx of CAD, DM2, HTN, CKD, OA, s/p left hip arthoplasty (2001), prostate cancer, PAF, left ureteral stone s/p stenting (01/2020) who was sent from ortho clinic on 11/23 by Dr Melodi. He grew Enterobacter from blood, urine and hip aspirate. He is sp surgery with Irrigation and debridement, left hip, with bearing surface exchange. Enterobacter grew on cultures.  We had him scheduled to complete antibiotics on 10 January.  In the interim he developed a fluid collection in his thigh that was drained and seemed rather bloody but which also grew Enterobacter from culture.   He finished cefepime  and we started bactrim  DS BID he ran into problems with hyperkalemia and elevated creatinine also in the context of other potassium sparing and potassium raising medications  '  Interim narrative:   We obtained Nuzyra  PA though due to his deductible not having yet been met he would have to pay roughly $2k upfront.   He ended up doing that but then turned out that he need to pay $640 a month even though he had met that amount.   I then took [place] off he Nuzyra  because clinically seem d to be doing well and his CRP had normalized though his sed rate had not normalized I did come down from 140 into the 90s.  I also felt like continue him on this tetracycline would just be to cost prohibitive.  Unfortunately since he came off antibiotics he experienced recurrence of infection at his hip with increasing pain and also pus which began coming through the skin.  He was seen by Dr.  Hiram  who aspirated the area and sent for culture. Exact same Enterobacter grew yet again from culture in June of 2022.  After careful consideration we decided to start levofloxacin  despite risk of C. difficile colitis, Achilles tendinopathy  confusion problems with blood sugar control and QT prolongation in the context of him being on amiodarone  and having slight QT prolongation at baseline.  Since starting levofloxacin  he to be having no adverse effects from this.  He returned to clinic and we obtained a twelve-lead EKG.  If EKG shows normal sinus rhythm with heart first-degree AV block but no significant ST or T wave changes and a QT and QTc of 429 and 451 which was largely unchanged from his last EKG obtained.  1 time when I saw [name] is having some pruritus that he did to the levofloxacin .  He continues to have this but he is able to tolerate it.  I was told by triage staff that his levofloxacin  had been discontinued possibly by Dr. Kassie but I do not see mention of this in his note indeed he was not off levofloxacin .  His pain has been stable and seems to have been improved.  He did have a flare of zoster recently which caused him to have pain in his left lower extremity and also up in his hip.  This is improved however.  He does still have itching that is worse when he goes to bed or when he is winding down.  He previously very much would like to come off the antibiotics would like to push through 1 year from when it recurred which would be in July.  Returns to clinic now in July a year  later.  We had further discussions about continuing antibiotics versus stopping them I think in my opinion it be prudent to continue him on them for now and revisit this in 6 months because I have a high suspicion he will have recurrence once he comes off.  He appears to be doing relatively well today his hip pain is stable he really only has it when he sits in his recliner and sounds when he gets up in the middle the night to go to the bathroom.  He had a right-sided knee effusion that continues to bother him but has responded to treatment with aspiration and injection of corticosteroids with Dr. Hiram.  He did have an episode of  transient confusion when he was driving his car and lost his way in [place] but I do not think this is related to levofloxacin  because it was transient   Discussed the use of AI scribe software for clinical note transcription with the patient, who gave verbal consent to proceed.  History of Present Illness                Past Medical History:  Diagnosis Date   Arthritis    Asthma    no inhaler   Atrial fibrillation (HCC) 10/16/2020   CKD (chronic kidney disease) 04/13/2020   Coronary artery disease    cardiologist-  dr spruill; last visit 3 mos ago per pt   Enterobacter sepsis (HCC) 04/13/2020   GERD (gastroesophageal reflux disease)    History of MI (myocardial infarction)    1985   Hydronephrosis, left    Hypertension    Myocardial infarction (HCC)    Nephrolithiasis    left   Neuromuscular disorder (HCC)    TINGLING IN BOTH HANDS   Presence of tooth-root and mandibular implants    lower dental implants   Prostate cancer (HCC)    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   QT prolongation 10/16/2020   Shortness of breath    WITH EXERTION   Thigh abscess 04/13/2020   Type 2 diabetes mellitus (HCC)    Vaccine counseling 04/28/2022   Zoster 09/13/2021    Past Surgical History:  Procedure Laterality Date   BUNIONECTOMY     CYSTOSCOPY W/ RETROGRADES Left 03/14/2014   Procedure: CYSTOSCOPY WITH LEFT RETROGRADE PYELOGRAM, Left Ureteroscopy, [place] ureteral Stent No string;  Surgeon: Oliva VEAR Oiler, MD;  Location: [facility];  Service: Urology;  Laterality: Left;   CYSTOSCOPY W/ URETERAL STENT PLACEMENT Bilateral 01/15/2020   Procedure: CYSTOSCOPY WITH RETROGRADE PYELOGRAM/URETERAL STENT PLACEMENT;  Surgeon: Alvaro Hummer, MD;  Location: [facility];  Service: Urology;  Laterality: Bilateral;   CYSTOSCOPY/URETEROSCOPY/HOLMIUM LASER/STENT PLACEMENT Bilateral 03/17/2020   Procedure: CYSTOSCOPY/URETEROSCOPY/HOLMIUM LASER/STENT LEFT  PLACEMENT;  Surgeon: Nieves Cough, MD;  Location: [facility];  Service: Urology;  Laterality: Bilateral;   HERNIA REPAIR     INCISION AND DRAINAGE Left 03/09/2020   Procedure: INCISION AND DRAINAGE LEFT HIP WITH LINER EXCHANGE;  Surgeon: Melodi Lerner, MD;  Location: [facility];  Service: Orthopedics;  Laterality: Left;   IR FLUORO GUIDE CV LINE RIGHT  03/10/2020   IR RADIOLOGIST EVAL & MGMT  04/01/2020   IR RADIOLOGIST EVAL & MGMT  04/15/2020   IR RADIOLOGIST EVAL & MGMT  04/30/2020   IR REMOVAL TUN CV CATH W/O FL  04/22/2020   IR US  GUIDE BX ASP/DRAIN  03/17/2020   IR US  GUIDE VASC ACCESS RIGHT  03/10/2020   PROSTATE BIOPSY     TOTAL HIP ARTHROPLASTY Left 03-20-2009  TOTAL HIP ARTHROPLASTY  04/09/2012   Procedure: TOTAL HIP ARTHROPLASTY;  Surgeon: Dempsey LULLA Moan, MD;  Location: [facility];  Service: Orthopedics;  Laterality: Right;   TOTAL KNEE ARTHROPLASTY Left 05-16-2008    Family History  Problem Relation Age of Onset   Cancer Mother        breast   Multiple sclerosis Daughter    Cancer Father        prostate   Diabetes Father    Cancer Maternal Uncle        bone      Social History   Socioeconomic History   Marital status: Widowed    Spouse name: Not on file   Number of children: 5   Years of education: 9   Highest education level: 9th grade  Occupational History   Occupation: Retired  Tobacco Use   Smoking status: Former    Current packs/day: 0.00    Average packs/day: 1 pack/day for 25.0 years (25.0 ttl pk-yrs)    Types: Cigarettes    Start date: 04/12/1959    Quit date: 04/11/1984    Years since quitting: 39.9   Smokeless tobacco: Former    Quit date: 04/02/1985  Vaping Use   Vaping status: Never Used  Substance and Sexual Activity   Alcohol use: Not Currently    Comment: RARE   Drug use: No   Sexual activity: Not Currently  Other Topics Concern   Not on file  Social History Narrative   Lives alone. He had five children and one  has deceased. He enjoys watching television.   Social Drivers of Health   Tobacco Use: Medium Risk (01/10/2024)   Patient History    Smoking Tobacco Use: Former    Smokeless Tobacco Use: Former    Passive Exposure: Not on Actuary Strain: Low Risk (10/03/2023)   Overall Financial Resource Strain (CARDIA)    Difficulty of Paying Living Expenses: Not hard at all  Food Insecurity: No Food Insecurity (10/03/2023)   Epic    Worried About Programme Researcher, Broadcasting/film/video in the Last Year: Never true    Ran Out of Food in the Last Year: Never true  Transportation Needs: No Transportation Needs (10/03/2023)   Epic    Lack of Transportation (Medical): No    Lack of Transportation (Non-Medical): No  Physical Activity: Sufficiently Active (10/03/2023)   Exercise Vital Sign    Days of Exercise per Week: 4 days    Minutes of Exercise per Session: 60 min  Stress: No Stress Concern Present (10/03/2023)   [employer] of Occupational Health - Occupational Stress Questionnaire    Feeling of Stress: Not at all  Social Connections: Moderately Integrated (10/03/2023)   Social Connection and Isolation Panel    Frequency of Communication with Friends and Family: More than three times a week    Frequency of Social Gatherings with Friends and Family: More than three times a week    Attends Religious Services: More than 4 times per year    Active Member of [employer] or Organizations: Yes    Attends Banker Meetings: More than 4 times per year    Marital Status: Widowed  Depression (PHQ2-9): High Risk (01/10/2024)   Depression (PHQ2-9)    PHQ-2 Score: 11  Alcohol Screen: Low Risk (10/03/2023)   Alcohol Screen    Last Alcohol Screening Score (AUDIT): 0  Housing: Low Risk (10/03/2023)   Epic    Unable to Pay for Housing in the Last  Year: No    Number of Times Moved in the Last Year: 0    Homeless in the Last Year: No  Utilities: Not At Risk (10/03/2023)   Epic     Threatened with loss of utilities: No  Health Literacy: Adequate Health Literacy (10/03/2023)   B1300 Health Literacy    Frequency of need for help with medical instructions: Never    Allergies  Allergen Reactions   Shellfish Allergy Rash     Current Outpatient Medications:    albuterol  (VENTOLIN  HFA) 108 (90 Base) MCG/ACT inhaler, Inhale 2 puffs into the lungs every 6 (six) hours as needed for wheezing or shortness of breath., Disp: 8 g, Rfl: 0   allopurinol  (ZYLOPRIM ) 100 MG tablet, TAKE 1/2 TABLET BY MOUTH DAILY, Disp: 45 tablet, Rfl: 1   AMBULATORY NON FORMULARY MEDICATION, Please provide rollator walker with hand brakes and seat.  Dx: R26.81 Gait instability, Disp: 1 Device, Rfl: 0   amiodarone  (PACERONE ) 200 MG tablet, Take 200 mg by mouth daily. Dr. Levern, Disp: , Rfl:    amLODipine  (NORVASC ) 10 MG tablet, TAKE 1 TABLET BY MOUTH EVERY DAY, Disp: 90 tablet, Rfl: 1   aspirin  EC 81 MG tablet, Take 81 mg by mouth daily. Swallow whole., Disp: , Rfl:    chlorthalidone (HYGROTON) 25 MG tablet, Take 25 mg by mouth 3 (three) times a week., Disp: , Rfl:    Cholecalciferol (VITAMIN D3) 1.25 MG (50000 UT) CAPS, Take 1 capsule by mouth once a week., Disp: , Rfl:    colchicine  0.6 MG tablet, Take 1 tablet (0.6 mg total) by mouth daily. Please bill discount card vs insurance- BIN [phone], PCN [id number], Group [id number], Member ID [id number], Disp: 30 tablet, Rfl: 12   Continuous Glucose Sensor (FREESTYLE LIBRE 3 PLUS SENSOR) MISC, CHANGE SENSOR EVERY 15 DAYS, Disp: 2 each, Rfl: 3   docusate sodium  (COLACE) 100 MG capsule, Take 100 mg by mouth daily as needed for mild constipation., Disp: , Rfl:    empagliflozin  (JARDIANCE ) 25 MG TABS tablet, Take 1 tablet (25 mg total) by mouth daily before breakfast., Disp: 90 tablet, Rfl: 1   ferrous sulfate  325 (65 FE) MG tablet, Take 325 mg by mouth daily with breakfast., Disp: , Rfl:    furosemide  (LASIX ) 20 MG tablet, TAKE 1 TABLET BY MOUTH EVERY DAY,  Disp: 90 tablet, Rfl: 0   hydrALAZINE  (APRESOLINE ) 50 MG tablet, Take 50 mg by mouth 2 (two) times daily. (Patient not taking: Reported on 01/10/2024), Disp: , Rfl:    levofloxacin  (LEVAQUIN ) 500 MG tablet, Take 1 tablet by mouth daily., Disp: 30 tablet, Rfl: 11   levothyroxine  (SYNTHROID ) 75 MCG tablet, 1 pill every morning except Sundays (Patient not taking: Reported on 01/10/2024), Disp: 90 tablet, Rfl: 1   metoprolol  succinate (TOPROL -XL) 50 MG 24 hr tablet, Take 50 mg by mouth daily., Disp: , Rfl:    ofloxacin (OCUFLOX) 0.3 % ophthalmic solution, Place 1 drop into the right eye 4 (four) times daily., Disp: , Rfl:    omeprazole  (PRILOSEC) 40 MG capsule, TAKE 1 CAPSULE BY MOUTH EVERY MORNING AND AT BEDTIME, Disp: 180 capsule, Rfl: 0   prednisoLONE acetate (PRED FORTE) 1 % ophthalmic suspension, Place 1 drop into the right eye 4 (four) times daily., Disp: , Rfl:    rosuvastatin  (CRESTOR ) 40 MG tablet, TAKE 1 TABLET BY MOUTH EVERY DAY (Patient not taking: Reported on 01/10/2024), Disp: 90 tablet, Rfl: 3   silver  sulfADIAZINE  (SILVADENE ) 1 % cream, Apply 1 Application  topically daily., Disp: 50 g, Rfl: 0   sitaGLIPtin  (JANUVIA ) 50 MG tablet, Take 1 tablet (50 mg total) by mouth daily., Disp: 90 tablet, Rfl: 1   spironolactone  (ALDACTONE ) 25 MG tablet, TAKE 1/2 TABLET BY MOUTH EVERY DAY, Disp: 7 tablet, Rfl: 0   Review of Systems     Objective:   Physical Exam   He has what appears to be a lipoma on wrist. Seems a bit too proximal on arm to be a ganglion cyst       Assessment & Plan:     Assessment and Plan                     "

## 2024-04-01 ENCOUNTER — Ambulatory Visit: Admitting: Infectious Disease

## 2024-04-01 ENCOUNTER — Other Ambulatory Visit: Payer: Self-pay

## 2024-04-01 ENCOUNTER — Encounter: Payer: Self-pay | Admitting: Infectious Disease

## 2024-04-01 VITALS — BP 124/70 | HR 58 | Temp 97.6°F | Ht 71.0 in | Wt 204.0 lb

## 2024-04-01 DIAGNOSIS — Z96641 Presence of right artificial hip joint: Secondary | ICD-10-CM | POA: Diagnosis not present

## 2024-04-01 DIAGNOSIS — N189 Chronic kidney disease, unspecified: Secondary | ICD-10-CM | POA: Diagnosis not present

## 2024-04-01 DIAGNOSIS — T8452XD Infection and inflammatory reaction due to internal left hip prosthesis, subsequent encounter: Secondary | ICD-10-CM | POA: Diagnosis not present

## 2024-04-01 DIAGNOSIS — L299 Pruritus, unspecified: Secondary | ICD-10-CM | POA: Insufficient documentation

## 2024-04-01 DIAGNOSIS — L853 Xerosis cutis: Secondary | ICD-10-CM | POA: Diagnosis not present

## 2024-04-01 DIAGNOSIS — Z96649 Presence of unspecified artificial hip joint: Secondary | ICD-10-CM

## 2024-04-01 DIAGNOSIS — B9689 Other specified bacterial agents as the cause of diseases classified elsewhere: Secondary | ICD-10-CM | POA: Diagnosis not present

## 2024-04-01 DIAGNOSIS — N183 Chronic kidney disease, stage 3 unspecified: Secondary | ICD-10-CM

## 2024-04-01 DIAGNOSIS — Z7185 Encounter for immunization safety counseling: Secondary | ICD-10-CM

## 2024-04-01 MED ORDER — LEVOFLOXACIN 500 MG PO TABS: 500.0000 mg | ORAL_TABLET | Freq: Every day | ORAL | 11 refills | Status: DC

## 2024-04-02 ENCOUNTER — Ambulatory Visit: Payer: Self-pay

## 2024-04-02 LAB — CBC WITH DIFFERENTIAL/PLATELET
Absolute Lymphocytes: 1029 {cells}/uL (ref 850–3900)
Absolute Monocytes: 588 {cells}/uL (ref 200–950)
Basophils Absolute: 20 {cells}/uL (ref 0–200)
Basophils Relative: 0.4 %
Eosinophils Absolute: 221 {cells}/uL (ref 15–500)
Eosinophils Relative: 4.5 %
HCT: 30.4 % — ABNORMAL LOW (ref 39.4–51.1)
Hemoglobin: 10.2 g/dL — ABNORMAL LOW (ref 13.2–17.1)
MCH: 32.5 pg (ref 27.0–33.0)
MCHC: 33.6 g/dL (ref 31.6–35.4)
MCV: 96.8 fL (ref 81.4–101.7)
MPV: 9 fL (ref 7.5–12.5)
Monocytes Relative: 12 %
Neutro Abs: 3043 {cells}/uL (ref 1500–7800)
Neutrophils Relative %: 62.1 %
Platelets: 305 Thousand/uL (ref 140–400)
RBC: 3.14 Million/uL — ABNORMAL LOW (ref 4.20–5.80)
RDW: 13.7 % (ref 11.0–15.0)
Total Lymphocyte: 21 %
WBC: 4.9 Thousand/uL (ref 3.8–10.8)

## 2024-04-02 LAB — BASIC METABOLIC PANEL WITHOUT GFR
BUN/Creatinine Ratio: 14 (calc) (ref 6–22)
BUN: 44 mg/dL — ABNORMAL HIGH (ref 7–25)
CO2: 23 mmol/L (ref 20–32)
Calcium: 9 mg/dL (ref 8.6–10.3)
Chloride: 104 mmol/L (ref 98–110)
Creat: 3.06 mg/dL — ABNORMAL HIGH (ref 0.70–1.28)
Glucose, Bld: 123 mg/dL — ABNORMAL HIGH (ref 65–99)
Potassium: 4.3 mmol/L (ref 3.5–5.3)
Sodium: 136 mmol/L (ref 135–146)

## 2024-04-02 LAB — SEDIMENTATION RATE: Sed Rate: 48 mm/h — ABNORMAL HIGH (ref 0–20)

## 2024-04-02 LAB — C-REACTIVE PROTEIN: CRP: <3 mg/L

## 2024-04-02 NOTE — Telephone Encounter (Signed)
-----   Message from Jomarie Fleeta Rothman, MD sent at 04/02/2024  8:35 AM EST ----- His renal function is worse. Can he change to levaquin  500mg  every other day and see PCP re his creatinine going up

## 2024-04-02 NOTE — Telephone Encounter (Signed)
 Spoke with pt regarding results. Understands MD would like for him to switch Levquin 500 MG to every other day. Pt did voice he is working with his pcp regarding his kidneys. Lorenda CHRISTELLA Code, RMA

## 2024-04-03 ENCOUNTER — Ambulatory Visit: Admitting: Podiatry

## 2024-04-03 ENCOUNTER — Encounter: Payer: Self-pay | Admitting: Podiatry

## 2024-04-03 DIAGNOSIS — M79675 Pain in left toe(s): Secondary | ICD-10-CM | POA: Diagnosis not present

## 2024-04-03 DIAGNOSIS — B351 Tinea unguium: Secondary | ICD-10-CM | POA: Diagnosis not present

## 2024-04-03 DIAGNOSIS — M79674 Pain in right toe(s): Secondary | ICD-10-CM

## 2024-04-03 NOTE — Progress Notes (Signed)
 Subjective:   Patient ID: [name], male   DOB: 78 y.o.   MRN: [id number]   HPI Patient presents with elongated nailbeds 1-5 both feet thick dystrophic painful   ROS      Objective:  Physical Exam  Neuro vascular status intact with mycotic nail infection 1-5 both feet that are painful     Assessment:  Chronic mycotic nail infection 1-5 both feet with pain     Plan:  Debride painful nailbeds 1-5 both feet Neutra genic bleeding reappoint routine care

## 2024-04-06 ENCOUNTER — Other Ambulatory Visit (HOSPITAL_BASED_OUTPATIENT_CLINIC_OR_DEPARTMENT_OTHER): Payer: Self-pay | Admitting: Family

## 2024-04-06 DIAGNOSIS — I10 Essential (primary) hypertension: Secondary | ICD-10-CM

## 2024-04-12 ENCOUNTER — Other Ambulatory Visit

## 2024-04-13 LAB — TSH+FREE T4: TSH W/REFLEX TO FT4: 1.02 m[IU]/L (ref 0.40–4.50)

## 2024-04-17 ENCOUNTER — Ambulatory Visit (INDEPENDENT_AMBULATORY_CARE_PROVIDER_SITE_OTHER): Admitting: "Endocrinology

## 2024-04-17 ENCOUNTER — Encounter: Payer: Self-pay | Admitting: "Endocrinology

## 2024-04-17 VITALS — BP 122/60 | HR 85 | Ht 71.0 in | Wt 206.0 lb

## 2024-04-17 DIAGNOSIS — E782 Mixed hyperlipidemia: Secondary | ICD-10-CM | POA: Diagnosis not present

## 2024-04-17 DIAGNOSIS — Z7984 Long term (current) use of oral hypoglycemic drugs: Secondary | ICD-10-CM

## 2024-04-17 DIAGNOSIS — E1165 Type 2 diabetes mellitus with hyperglycemia: Secondary | ICD-10-CM

## 2024-04-17 DIAGNOSIS — E038 Other specified hypothyroidism: Secondary | ICD-10-CM

## 2024-04-17 LAB — POCT GLYCOSYLATED HEMOGLOBIN (HGB A1C): Hemoglobin A1C: 6.9 % — AB (ref 4.0–5.6)

## 2024-04-17 MED ORDER — EMPAGLIFLOZIN 25 MG PO TABS: 25.0000 mg | ORAL_TABLET | Freq: Every day | ORAL | 3 refills | Status: DC

## 2024-04-17 NOTE — Progress Notes (Signed)
 "     Outpatient Endocrinology Note Aaron Birmingham, MD  04/17/2024   ALEXSANDRO SALEK 07-09-1945 [id number]  Referring Provider: Alvia Bring, DO Primary Care Provider: Alvia Bring, DO Reason for consultation: Subjective   Assessment & Plan  Diagnoses and all orders for this visit:  Uncontrolled type 2 diabetes mellitus with hyperglycemia (HCC) -     POCT glycosylated hemoglobin (Hb A1C) -     Microalbumin / creatinine urine ratio -     Lipid panel -     Comprehensive metabolic panel with GFR -     empagliflozin  (JARDIANCE ) 25 MG TABS tablet; Take 1 tablet (25 mg total) by mouth daily before breakfast.  Long term (current) use of oral hypoglycemic drugs  Mixed hypercholesterolemia and hypertriglyceridemia  Subclinical hypothyroidism   05/2022 Developed subclinical hypothyroidism  TSH persistently 9-12, with normal Total T4 On levothyroxine  75 mcg po every day TSH WNL, FT4 checked previously was normal Continue current dose Lab WNL  Diabetes complicated by neuropathy, nephropathy, CAD Hba1c goal less than 7.0, current Hba1c is  Lab Results  Component Value Date   HGBA1C 6.9 (A) 04/17/2024   HGBA1C 6.8 (A) 09/22/2023   HGBA1C 7.3 (A) 06/22/2023    Will recommend for the following change of medications to: Jardiance  25 mg every day Januvia  50 mg every day Glipizide  2.5 mg prn BG >200 (gets steroid shot in knee)  No known contraindications/side effects to any of above medications No UTI  Hyperlipidemia -02/2023 Last LDL at goal: 67 -on rosuvastatin  40 mg QD -Follow low fat diet and exercise   -Blood pressure goal <140/90 - Microalbumin/creatinine goal < 30 -Not on ACE/ARB, defer to nephrology -diet changes including salt restriction -limit eating outside -counseled BP targets per standards of diabetes care -Uncontrolled blood pressure can lead to retinopathy, nephropathy and cardiovascular and atherosclerotic heart disease   Reviewed and counseled  on: -A1C target -Blood sugar targets -Complications of uncontrolled diabetes  -Checking blood sugar before meals and bedtime and bring log next visit -All medications with mechanism of action and side effects -Hypoglycemia management: rule of 15's, Glucagon Emergency Kit and medical alert ID -low-carb low-fat plate-method diet -At least 20 minutes of physical activity per day -Annual dilated retinal eye exam and foot exam -compliance and follow up needs -follow up as scheduled or earlier if problem gets worse  Call if blood sugar is less than 70 or consistently above 250    Take a 15 gm snack of carbohydrate at bedtime before you go to sleep if your blood sugar is less than 100.    If you are going to fast after midnight for a test or procedure, ask your physician for instructions on how to reduce/decrease your insulin  dose.    Call if blood sugar is less than 70 or consistently above 250  -Treating a low sugar by rule of 15  (15 gms of sugar every 15 min until sugar is more than 70) If you feel your sugar is low, test your sugar to be sure If your sugar is low (less than 70), then take 15 grams of a fast acting Carbohydrate (3-4 glucose tablets or glucose gel or 4 ounces of juice or regular soda) Recheck your sugar 15 min after treating low to make sure it is more than 70 If sugar is still less than 70, treat again with 15 grams of carbohydrate          Don't drive the hour of hypoglycemia  If unconscious/unable  to eat or drink by mouth, use glucagon injection or nasal spray baqsimi and call 911. Can repeat again in 15 min if still unconscious.  Return in about 6 months (around 10/15/2024) for 8 am labs the day of next visit .   I have reviewed current medications, nurse's notes, allergies, vital signs, past medical and surgical history, family medical history, and social history for this encounter. Counseled patient on symptoms, examination findings, lab findings, imaging results,  treatment decisions and monitoring and prognosis. The patient understood the recommendations and agrees with the treatment plan. All questions regarding treatment plan were fully answered.  Aaron Birmingham, MD  04/17/2024    History of Present Illness DMARION PERFECT is a 79 y.o. year old male who presents for follow up of Type 2 diabetes mellitus.  RAIFE LIZER was first diagnosed in 2001.   Diabetes education +  Currently taking, Jardiance  25 mg every day Januvia  50 mg every day Glipizide  2.5 mg prn BG >200 (gets steroid shot in knee)  Stopped Januvia  50 mg every day  Previous history: Non-insulin  hypoglycemic drugs: Januvia  50 mg daily (gets samples for the year by another provider),  Took Prandin  0.5 mg qam, jardiance  10 mg every day without issues  He was previously on metformin  which was stopped because of renal dysfunction Amaryl  was added in 2013 starting at 4 mg daily Januvia  was started in 2018  Side effects from medications: None  COMPLICATIONS +  MI , - Stroke -  retinopathy, last eye exam 2023 +  neuropathy +  nephropathy  BLOOD SUGAR DATA  CGM interpretation: At today's visit, we reviewed her CGM downloads. The full report is scanned in the media. Reviewing the CGM trends, BG are well controlled across the day.  Physical Exam  BP 122/60   Pulse 85   Ht 5' 11 (1.803 m)   Wt 206 lb (93.4 kg)   SpO2 96%   BMI 28.73 kg/m    Constitutional: well developed, well nourished Head: normocephalic, atraumatic Eyes: sclera anicteric, no redness Neck: supple Lungs: normal respiratory effort Neurology: alert and oriented Skin: dry, no appreciable rashes Musculoskeletal: no appreciable defects Psychiatric: normal mood and affect Diabetic Foot Exam - Simple   Simple Foot Form Diabetic Foot exam was performed with the following findings: Yes 04/17/2024  8:31 AM  Visual Inspection No deformities, no ulcerations, no other skin breakdown bilaterally:  Yes Sensation Testing Intact to touch and monofilament testing bilaterally: Yes Pulse Check Posterior Tibialis and Dorsalis pulse intact bilaterally: Yes Comments R foot metatarsal bony protrusion       Current Medications Patient's Medications  New Prescriptions   No medications on file  Previous Medications   ALBUTEROL  (VENTOLIN  HFA) 108 (90 BASE) MCG/ACT INHALER    Inhale 2 puffs into the lungs every 6 (six) hours as needed for wheezing or shortness of breath.   ALLOPURINOL  (ZYLOPRIM ) 100 MG TABLET    TAKE 1/2 TABLET BY MOUTH DAILY   AMBULATORY NON FORMULARY MEDICATION    Please provide rollator walker with hand brakes and seat.  Dx: R26.81 Gait instability   AMIODARONE  (PACERONE ) 200 MG TABLET    Take 200 mg by mouth daily. Dr. Levern   AMLODIPINE  (NORVASC ) 10 MG TABLET    TAKE 1 TABLET BY MOUTH EVERY DAY   ASPIRIN  EC 81 MG TABLET    Take 81 mg by mouth daily. Swallow whole.   CHLORTHALIDONE (HYGROTON) 25 MG TABLET    Take 25 mg by mouth  3 (three) times a week.   CHOLECALCIFEROL (VITAMIN D3) 1.25 MG (50000 UT) CAPS    Take 1 capsule by mouth once a week.   COLCHICINE  0.6 MG TABLET    Take 1 tablet (0.6 mg total) by mouth daily. Please bill discount card vs insurance- BIN [phone], PCN [id number], Group [id number], Member ID [id number]   CONTINUOUS GLUCOSE SENSOR (FREESTYLE LIBRE 3 PLUS SENSOR) MISC    CHANGE SENSOR EVERY 15 DAYS   DOCUSATE SODIUM  (COLACE) 100 MG CAPSULE    Take 100 mg by mouth daily as needed for mild constipation.   FERROUS SULFATE  325 (65 FE) MG TABLET    Take 325 mg by mouth daily with breakfast.   FUROSEMIDE  (LASIX ) 20 MG TABLET    Take 1 tablet (20 mg total) by mouth daily. PLEASE KEEP UPCOMING APPOINTMENT IN ORDER TO RECEIVE ADDITIONAL REFILLS, THANK YOU!   GLIPIZIDE  2.5 MG TABS    Take 1 tablet by mouth as needed.   HYDRALAZINE  (APRESOLINE ) 50 MG TABLET    Take 50 mg by mouth 2 (two) times daily.   LEVOFLOXACIN  (LEVAQUIN ) 500 MG TABLET    Take 1 tablet by mouth daily.    LEVOTHYROXINE  (SYNTHROID ) 75 MCG TABLET    1 pill every morning except Sundays   METOPROLOL  SUCCINATE (TOPROL -XL) 50 MG 24 HR TABLET    Take 50 mg by mouth daily.   OFLOXACIN (OCUFLOX) 0.3 % OPHTHALMIC SOLUTION    Place 1 drop into the right eye 4 (four) times daily.   OMEPRAZOLE  (PRILOSEC) 40 MG CAPSULE    TAKE 1 CAPSULE BY MOUTH EVERY MORNING AND AT BEDTIME   PREDNISOLONE ACETATE (PRED FORTE) 1 % OPHTHALMIC SUSPENSION    Place 1 drop into the right eye 4 (four) times daily.   ROSUVASTATIN  (CRESTOR ) 40 MG TABLET    TAKE 1 TABLET BY MOUTH EVERY DAY   SILVER  SULFADIAZINE  (SILVADENE ) 1 % CREAM    Apply 1 Application topically daily.   SITAGLIPTIN  (JANUVIA ) 50 MG TABLET    Take 1 tablet (50 mg total) by mouth daily.   SPIRONOLACTONE  (ALDACTONE ) 25 MG TABLET    TAKE 1/2 TABLET BY MOUTH EVERY DAY  Modified Medications   Modified Medication Previous Medication   EMPAGLIFLOZIN  (JARDIANCE ) 25 MG TABS TABLET empagliflozin  (JARDIANCE ) 25 MG TABS tablet      Take 1 tablet (25 mg total) by mouth daily before breakfast.    Take 1 tablet (25 mg total) by mouth daily before breakfast.  Discontinued Medications   No medications on file    Allergies Allergies  Allergen Reactions   Shellfish Allergy Rash    Past Medical History Past Medical History:  Diagnosis Date   Arthritis    Asthma    no inhaler   Atrial fibrillation (HCC) 10/16/2020   CKD (chronic kidney disease) 04/13/2020   Coronary artery disease    cardiologist-  dr spruill; last visit 3 mos ago per pt   Enterobacter sepsis (HCC) 04/13/2020   GERD (gastroesophageal reflux disease)    History of MI (myocardial infarction)    1985   Hydronephrosis, left    Hypertension    Myocardial infarction (HCC)    Nephrolithiasis    left   Neuromuscular disorder (HCC)    TINGLING IN BOTH HANDS   Presence of tooth-root and mandibular implants    lower dental implants   Prostate cancer (HCC)    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   Pruritus  04/01/2024   QT prolongation 10/16/2020  Shortness of breath    WITH EXERTION   Thigh abscess 04/13/2020   Type 2 diabetes mellitus (HCC)    Vaccine counseling 04/28/2022   Xerosis of skin 04/01/2024   Zoster 09/13/2021    Past Surgical History Past Surgical History:  Procedure Laterality Date   BUNIONECTOMY     CYSTOSCOPY W/ RETROGRADES Left 03/14/2014   Procedure: CYSTOSCOPY WITH LEFT RETROGRADE PYELOGRAM, Left Ureteroscopy, [place] ureteral Stent No string;  Surgeon: Oliva VEAR Oiler, MD;  Location: [facility] [place];  Service: Urology;  Laterality: Left;   CYSTOSCOPY W/ URETERAL STENT PLACEMENT Bilateral 01/15/2020   Procedure: CYSTOSCOPY WITH RETROGRADE PYELOGRAM/URETERAL STENT PLACEMENT;  Surgeon: Alvaro Hummer, MD;  Location: [facility];  Service: Urology;  Laterality: Bilateral;   CYSTOSCOPY/URETEROSCOPY/HOLMIUM LASER/STENT PLACEMENT Bilateral 03/17/2020   Procedure: CYSTOSCOPY/URETEROSCOPY/HOLMIUM LASER/STENT LEFT PLACEMENT;  Surgeon: Nieves Cough, MD;  Location: [facility];  Service: Urology;  Laterality: Bilateral;   HERNIA REPAIR     INCISION AND DRAINAGE Left 03/09/2020   Procedure: INCISION AND DRAINAGE LEFT HIP WITH LINER EXCHANGE;  Surgeon: Melodi Lerner, MD;  Location: [facility];  Service: Orthopedics;  Laterality: Left;   IR FLUORO GUIDE CV LINE RIGHT  03/10/2020   IR RADIOLOGIST EVAL & MGMT  04/01/2020   IR RADIOLOGIST EVAL & MGMT  04/15/2020   IR RADIOLOGIST EVAL & MGMT  04/30/2020   IR REMOVAL TUN CV CATH W/O FL  04/22/2020   IR US  GUIDE BX ASP/DRAIN  03/17/2020   IR US  GUIDE VASC ACCESS RIGHT  03/10/2020   PROSTATE BIOPSY     TOTAL HIP ARTHROPLASTY Left 03-20-2009   TOTAL HIP ARTHROPLASTY  04/09/2012   Procedure: TOTAL HIP ARTHROPLASTY;  Surgeon: Lerner LULLA Melodi, MD;  Location: [facility];  Service: Orthopedics;  Laterality: Right;   TOTAL KNEE ARTHROPLASTY Left 05-16-2008    Family History family history includes Cancer in his father, maternal uncle, and mother;  Diabetes in his father; Multiple sclerosis in his daughter.  Social History Social History   Socioeconomic History   Marital status: Widowed    Spouse name: Not on file   Number of children: 5   Years of education: 9   Highest education level: 9th grade  Occupational History   Occupation: Retired  Tobacco Use   Smoking status: Former    Current packs/day: 0.00    Average packs/day: 1 pack/day for 25.0 years (25.0 ttl pk-yrs)    Types: Cigarettes    Start date: 04/12/1959    Quit date: 04/11/1984    Years since quitting: 40.0   Smokeless tobacco: Former    Quit date: 04/02/1985  Vaping Use   Vaping status: Never Used  Substance and Sexual Activity   Alcohol use: Not Currently    Comment: RARE   Drug use: No   Sexual activity: Not Currently  Other Topics Concern   Not on file  Social History Narrative   Lives alone. He had five children and one has deceased. He enjoys watching television.   Social Drivers of Health   Tobacco Use: Medium Risk (04/17/2024)   Patient History    Smoking Tobacco Use: Former    Smokeless Tobacco Use: Former    Passive Exposure: Not on Actuary Strain: Low Risk (10/03/2023)   Overall Financial Resource Strain (CARDIA)    Difficulty of Paying Living Expenses: Not hard at all  Food Insecurity: No Food Insecurity (10/03/2023)   Epic    Worried About Radiation Protection Practitioner of Food in the Last Year:  Never true    Ran Out of Food in the Last Year: Never true  Transportation Needs: No Transportation Needs (10/03/2023)   Epic    Lack of Transportation (Medical): No    Lack of Transportation (Non-Medical): No  Physical Activity: Sufficiently Active (10/03/2023)   Exercise Vital Sign    Days of Exercise per Week: 4 days    Minutes of Exercise per Session: 60 min  Stress: No Stress Concern Present (10/03/2023)   [employer] of Occupational Health - Occupational Stress Questionnaire    Feeling of Stress: Not at all  Social Connections:  Moderately Integrated (10/03/2023)   Social Connection and Isolation Panel    Frequency of Communication with Friends and Family: More than three times a week    Frequency of Social Gatherings with Friends and Family: More than three times a week    Attends Religious Services: More than 4 times per year    Active Member of [employer] or Organizations: Yes    Attends Banker Meetings: More than 4 times per year    Marital Status: Widowed  Intimate Partner Violence: Not At Risk (10/03/2023)   Epic    Fear of Current or Ex-Partner: No    Emotionally Abused: No    Physically Abused: No    Sexually Abused: No  Depression (PHQ2-9): High Risk (01/10/2024)   Depression (PHQ2-9)    PHQ-2 Score: 11  Alcohol Screen: Low Risk (10/03/2023)   Alcohol Screen    Last Alcohol Screening Score (AUDIT): 0  Housing: Low Risk (10/03/2023)   Epic    Unable to Pay for Housing in the Last Year: No    Number of Times Moved in the Last Year: 0    Homeless in the Last Year: No  Utilities: Not At Risk (10/03/2023)   Epic    Threatened with loss of utilities: No  Health Literacy: Adequate Health Literacy (10/03/2023)   B1300 Health Literacy    Frequency of need for help with medical instructions: Never    Lab Results  Component Value Date   HGBA1C 6.9 (A) 04/17/2024   Lab Results  Component Value Date   CHOL 129 02/17/2023   Lab Results  Component Value Date   HDL 39 (L) 02/17/2023   Lab Results  Component Value Date   LDLCALC 67 02/17/2023   Lab Results  Component Value Date   TRIG 157 (H) 02/17/2023   Lab Results  Component Value Date   CHOLHDL 3.3 02/17/2023   Lab Results  Component Value Date   CREATININE 3.06 (H) 04/01/2024   Lab Results  Component Value Date   GFR 40.72 (L) 12/10/2021   Lab Results  Component Value Date   MICROALBUR 41.3 (H) 06/18/2015      Component Value Date/Time   NA 136 04/01/2024 1017   NA 140 08/17/2022 0847   K 4.3 04/01/2024 1017   CL 104  04/01/2024 1017   CO2 23 04/01/2024 1017   GLUCOSE 123 (H) 04/01/2024 1017   BUN 44 (H) 04/01/2024 1017   BUN 39 (H) 08/17/2022 0847   CREATININE 3.06 (H) 04/01/2024 1017   CALCIUM  9.0 04/01/2024 1017   PROT 7.0 06/07/2023 0902   PROT 7.0 05/24/2022 0708   ALBUMIN 4.1 05/24/2022 0708   AST 12 06/07/2023 0902   ALT 10 06/07/2023 0902   ALKPHOS 65 05/24/2022 0708   BILITOT 0.3 06/07/2023 0902   BILITOT 0.2 05/24/2022 0708   GFRNONAA 45 (L) 05/08/2021 0420   GFRNONAA  58 (L) 10/16/2020 0916   GFRAA 67 10/16/2020 0916      Latest Ref Rng & Units 04/01/2024   10:17 AM 06/07/2023    9:02 AM 11/08/2022    8:46 AM  BMP  Glucose 65 - 99 mg/dL 876  861  865   BUN 7 - 25 mg/dL 44  32  28   Creatinine 0.70 - 1.28 mg/dL 6.93  8.02  8.07   BUN/Creat Ratio 6 - 22 (calc) 14  16  15    Sodium 135 - 146 mmol/L 136  140  137   Potassium 3.5 - 5.3 mmol/L 4.3  5.1  4.5   Chloride 98 - 110 mmol/L 104  111  108   CO2 20 - 32 mmol/L 23  23  21    Calcium  8.6 - 10.3 mg/dL 9.0  8.5  9.2        Component Value Date/Time   WBC 4.9 04/01/2024 1017   RBC 3.14 (L) 04/01/2024 1017   HGB 10.2 (L) 04/01/2024 1017   HCT 30.4 (L) 04/01/2024 1017   PLT 305 04/01/2024 1017   MCV 96.8 04/01/2024 1017   MCH 32.5 04/01/2024 1017   MCHC 33.6 04/01/2024 1017   RDW 13.7 04/01/2024 1017   LYMPHSABS 1,111 11/08/2022 0846   MONOABS 0.6 08/01/2020 0837   EOSABS 221 04/01/2024 1017   BASOSABS 20 04/01/2024 1017     Parts of this note may have been dictated using voice recognition software. There may be variances in spelling and vocabulary which are unintentional. Not all errors are proofread. Please notify the dino if any discrepancies are noted or if the meaning of any statement is not clear.   "

## 2024-04-17 NOTE — Patient Instructions (Signed)

## 2024-05-01 ENCOUNTER — Other Ambulatory Visit (HOSPITAL_BASED_OUTPATIENT_CLINIC_OR_DEPARTMENT_OTHER): Payer: Self-pay | Admitting: Family

## 2024-05-01 DIAGNOSIS — I10 Essential (primary) hypertension: Secondary | ICD-10-CM

## 2024-05-04 ENCOUNTER — Other Ambulatory Visit (HOSPITAL_BASED_OUTPATIENT_CLINIC_OR_DEPARTMENT_OTHER): Payer: Self-pay

## 2024-05-04 ENCOUNTER — Other Ambulatory Visit: Payer: Self-pay | Admitting: "Endocrinology

## 2024-05-04 DIAGNOSIS — E038 Other specified hypothyroidism: Secondary | ICD-10-CM

## 2024-05-10 ENCOUNTER — Other Ambulatory Visit: Payer: Self-pay

## 2024-05-10 ENCOUNTER — Inpatient Hospital Stay (HOSPITAL_BASED_OUTPATIENT_CLINIC_OR_DEPARTMENT_OTHER)
Admission: EM | Admit: 2024-05-10 | Discharge: 2024-05-16 | DRG: 536 | Disposition: A | Discharge status: Skilled Nursing Facility | Attending: Family Medicine | Admitting: Family Medicine

## 2024-05-10 ENCOUNTER — Emergency Department (HOSPITAL_BASED_OUTPATIENT_CLINIC_OR_DEPARTMENT_OTHER)

## 2024-05-10 ENCOUNTER — Encounter (HOSPITAL_BASED_OUTPATIENT_CLINIC_OR_DEPARTMENT_OTHER): Payer: Self-pay | Admitting: Emergency Medicine

## 2024-05-10 ENCOUNTER — Emergency Department (HOSPITAL_BASED_OUTPATIENT_CLINIC_OR_DEPARTMENT_OTHER): Admitting: Radiology

## 2024-05-10 DIAGNOSIS — E785 Hyperlipidemia, unspecified: Secondary | ICD-10-CM | POA: Diagnosis present

## 2024-05-10 DIAGNOSIS — Z79899 Other long term (current) drug therapy: Secondary | ICD-10-CM

## 2024-05-10 DIAGNOSIS — Z96642 Presence of left artificial hip joint: Secondary | ICD-10-CM | POA: Diagnosis present

## 2024-05-10 DIAGNOSIS — S72001A Fracture of unspecified part of neck of right femur, initial encounter for closed fracture: Principal | ICD-10-CM | POA: Diagnosis present

## 2024-05-10 DIAGNOSIS — W000XXA Fall on same level due to ice and snow, initial encounter: Secondary | ICD-10-CM | POA: Diagnosis present

## 2024-05-10 DIAGNOSIS — Z87891 Personal history of nicotine dependence: Secondary | ICD-10-CM

## 2024-05-10 DIAGNOSIS — I5032 Chronic diastolic (congestive) heart failure: Secondary | ICD-10-CM | POA: Diagnosis present

## 2024-05-10 DIAGNOSIS — Z8546 Personal history of malignant neoplasm of prostate: Secondary | ICD-10-CM

## 2024-05-10 DIAGNOSIS — D631 Anemia in chronic kidney disease: Secondary | ICD-10-CM | POA: Diagnosis present

## 2024-05-10 DIAGNOSIS — Z91013 Allergy to seafood: Secondary | ICD-10-CM

## 2024-05-10 DIAGNOSIS — Z79890 Hormone replacement therapy (postmenopausal): Secondary | ICD-10-CM

## 2024-05-10 DIAGNOSIS — N1832 Chronic kidney disease, stage 3b: Secondary | ICD-10-CM | POA: Diagnosis present

## 2024-05-10 DIAGNOSIS — N179 Acute kidney failure, unspecified: Secondary | ICD-10-CM | POA: Diagnosis present

## 2024-05-10 DIAGNOSIS — I4891 Unspecified atrial fibrillation: Secondary | ICD-10-CM | POA: Diagnosis present

## 2024-05-10 DIAGNOSIS — E1122 Type 2 diabetes mellitus with diabetic chronic kidney disease: Secondary | ICD-10-CM | POA: Diagnosis present

## 2024-05-10 DIAGNOSIS — Z96652 Presence of left artificial knee joint: Secondary | ICD-10-CM | POA: Diagnosis present

## 2024-05-10 DIAGNOSIS — Z7982 Long term (current) use of aspirin: Secondary | ICD-10-CM

## 2024-05-10 DIAGNOSIS — I2510 Atherosclerotic heart disease of native coronary artery without angina pectoris: Secondary | ICD-10-CM | POA: Diagnosis present

## 2024-05-10 DIAGNOSIS — K219 Gastro-esophageal reflux disease without esophagitis: Secondary | ICD-10-CM | POA: Diagnosis present

## 2024-05-10 DIAGNOSIS — S72111A Displaced fracture of greater trochanter of right femur, initial encounter for closed fracture: Principal | ICD-10-CM | POA: Diagnosis present

## 2024-05-10 DIAGNOSIS — Z555 Less than a high school diploma: Secondary | ICD-10-CM

## 2024-05-10 DIAGNOSIS — I440 Atrioventricular block, first degree: Secondary | ICD-10-CM | POA: Diagnosis present

## 2024-05-10 DIAGNOSIS — Z833 Family history of diabetes mellitus: Secondary | ICD-10-CM

## 2024-05-10 DIAGNOSIS — E039 Hypothyroidism, unspecified: Secondary | ICD-10-CM | POA: Diagnosis present

## 2024-05-10 DIAGNOSIS — I130 Hypertensive heart and chronic kidney disease with heart failure and stage 1 through stage 4 chronic kidney disease, or unspecified chronic kidney disease: Secondary | ICD-10-CM | POA: Diagnosis present

## 2024-05-10 DIAGNOSIS — Z820 Family history of epilepsy and other diseases of the nervous system: Secondary | ICD-10-CM

## 2024-05-10 DIAGNOSIS — Y92007 Garden or yard of unspecified non-institutional (private) residence as the place of occurrence of the external cause: Secondary | ICD-10-CM

## 2024-05-10 DIAGNOSIS — M9701XA Periprosthetic fracture around internal prosthetic right hip joint, initial encounter: Secondary | ICD-10-CM | POA: Diagnosis present

## 2024-05-10 DIAGNOSIS — I4892 Unspecified atrial flutter: Secondary | ICD-10-CM | POA: Diagnosis present

## 2024-05-10 DIAGNOSIS — Z7984 Long term (current) use of oral hypoglycemic drugs: Secondary | ICD-10-CM

## 2024-05-10 DIAGNOSIS — Y9301 Activity, walking, marching and hiking: Secondary | ICD-10-CM | POA: Diagnosis present

## 2024-05-10 DIAGNOSIS — I252 Old myocardial infarction: Secondary | ICD-10-CM

## 2024-05-10 LAB — CBC WITH DIFFERENTIAL/PLATELET
Abs Immature Granulocytes: 0.01 10*3/uL (ref 0.00–0.07)
Basophils Absolute: 0 10*3/uL (ref 0.0–0.1)
Basophils Relative: 0 %
Eosinophils Absolute: 0 10*3/uL (ref 0.0–0.5)
Eosinophils Relative: 0 %
HCT: 28 % — ABNORMAL LOW (ref 39.0–52.0)
Hemoglobin: 9.6 g/dL — ABNORMAL LOW (ref 13.0–17.0)
Immature Granulocytes: 0 %
Lymphocytes Relative: 11 %
Lymphs Abs: 0.8 10*3/uL (ref 0.7–4.0)
MCH: 32.2 pg (ref 26.0–34.0)
MCHC: 34.3 g/dL (ref 30.0–36.0)
MCV: 94 fL (ref 80.0–100.0)
Monocytes Absolute: 0.9 10*3/uL (ref 0.1–1.0)
Monocytes Relative: 12 %
Neutro Abs: 5.6 10*3/uL (ref 1.7–7.7)
Neutrophils Relative %: 77 %
Platelets: 278 10*3/uL (ref 150–400)
RBC: 2.98 MIL/uL — ABNORMAL LOW (ref 4.22–5.81)
RDW: 13.1 % (ref 11.5–15.5)
WBC: 7.3 10*3/uL (ref 4.0–10.5)
nRBC: 0 % (ref 0.0–0.2)

## 2024-05-10 LAB — COMPREHENSIVE METABOLIC PANEL WITH GFR
ALT: 16 U/L (ref 0–44)
AST: 18 U/L (ref 15–41)
Albumin: 4.1 g/dL (ref 3.5–5.0)
Alkaline Phosphatase: 70 U/L (ref 38–126)
Anion gap: 15 (ref 5–15)
BUN: 60 mg/dL — ABNORMAL HIGH (ref 8–23)
CO2: 21 mmol/L — ABNORMAL LOW (ref 22–32)
Calcium: 9.2 mg/dL (ref 8.9–10.3)
Chloride: 99 mmol/L (ref 98–111)
Creatinine, Ser: 3.57 mg/dL — ABNORMAL HIGH (ref 0.61–1.24)
GFR, Estimated: 17 mL/min — ABNORMAL LOW
Glucose, Bld: 166 mg/dL — ABNORMAL HIGH (ref 70–99)
Potassium: 4.7 mmol/L (ref 3.5–5.1)
Sodium: 135 mmol/L (ref 135–145)
Total Bilirubin: 0.4 mg/dL (ref 0.0–1.2)
Total Protein: 8.9 g/dL — ABNORMAL HIGH (ref 6.5–8.1)

## 2024-05-10 MED ORDER — FENTANYL CITRATE (PF) 50 MCG/ML IJ SOSY
25.0000 ug | PREFILLED_SYRINGE | Freq: Once | INTRAMUSCULAR | Status: AC
  Administered 2024-05-10: 25 ug via INTRAVENOUS
  Filled 2024-05-10: qty 1

## 2024-05-10 MED ORDER — OXYCODONE-ACETAMINOPHEN 5-325 MG PO TABS
1.0000 | ORAL_TABLET | Freq: Once | ORAL | Status: AC
  Administered 2024-05-10: 1 via ORAL
  Filled 2024-05-10: qty 1

## 2024-05-10 NOTE — ED Provider Notes (Incomplete)
 "  EMERGENCY DEPARTMENT AT [facility] Provider Note   CSN: [id number] Arrival date & time: 05/10/24  1737     Patient presents with: [name]   [name] is a 79 y.o. male.  {Add pertinent medical, surgical, social history, OB history to YEP:[id number]} HPI       Past Medical History:  Diagnosis Date   Arthritis    Asthma    no inhaler   Atrial fibrillation (HCC) 10/16/2020   CKD (chronic kidney disease) 04/13/2020   Coronary artery disease    cardiologist-  dr spruill; last visit 3 mos ago per pt   Enterobacter sepsis (HCC) 04/13/2020   GERD (gastroesophageal reflux disease)    History of MI (myocardial infarction)    1985   Hydronephrosis, left    Hypertension    Myocardial infarction [facility])    Nephrolithiasis    left   Neuromuscular disorder (HCC)    TINGLING IN BOTH HANDS   Presence of tooth-root and mandibular implants    lower dental implants   Prostate cancer (HCC)    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 Authorizing Provider  albuterol  (VENTOLIN  HFA) 108 (90 Base) MCG/ACT inhaler Inhale 2 puffs into the lungs every 6 (six) hours as needed for wheezing or shortness of breath. 06/09/21   Alvia Bring, DO  allopurinol  (ZYLOPRIM ) 100 MG tablet TAKE 1/2 TABLET BY MOUTH DAILY 03/13/24   Alvia Bring, DO  AMBULATORY NON FORMULARY MEDICATION Please provide rollator walker with hand brakes and seat.  Dx: R26.81 Gait instability 07/11/23   Alvia Bring, DO  amiodarone  (PACERONE ) 200 MG tablet Take 200 mg by mouth daily. Dr. Levern    [provider]  amLODipine  (NORVASC ) 10 MG tablet TAKE 1 TABLET BY MOUTH  EVERY DAY 02/05/24   Alvia Bring, DO  aspirin  EC 81 MG tablet Take 81 mg by mouth daily. Swallow whole.    [provider]  chlorthalidone (HYGROTON) 25 MG tablet Take 25 mg by mouth 3 (three) times a week. 03/15/23   [provider]  Cholecalciferol (VITAMIN D3) 1.25 MG (50000 UT) CAPS Take 1 capsule by mouth once a week. 12/31/23   [provider]  colchicine  0.6 MG tablet Take 1 tablet (0.6 mg total) by mouth daily. Please bill discount card vs insurance- BIN [phone], PCN [employer], Group [id number], Member ID [id number] 05/09/23   Alvia Bring, DO  Continuous Glucose Sensor (FREESTYLE LIBRE 3 PLUS SENSOR) MISC CHANGE SENSOR EVERY 15 DAYS 03/12/24   Alvia Bring, DO  docusate sodium  (COLACE) 100 MG capsule Take 100 mg by mouth daily as needed for mild constipation.    [provider]  empagliflozin  (JARDIANCE ) 25 MG TABS tablet Take 1 tablet (25 mg total) by mouth daily before breakfast. 04/17/24   Motwani, Obadiah, MD  ferrous sulfate  325 (65 FE) MG tablet Take 325 mg by mouth daily with breakfast.    [provider]  furosemide  (LASIX ) 20 MG tablet TAKE 1 TABLET BY MOUTH DAILY. PLEASE KEEP UPCOMING APPOINTMENT FOR ADDITIONAL REFILLS, THANK YOU! 05/07/24   Vannie Reche RAMAN, NP  glipiZIDE  2.5 MG TABS Take 1 tablet by  mouth as needed. 03/26/24   [provider]  hydrALAZINE  (APRESOLINE ) 50 MG tablet Take 50 mg by mouth 2 (two) times daily. 06/10/23   [provider]  levofloxacin  (LEVAQUIN ) 500 MG tablet Take 1 tablet by mouth daily. 04/01/24   Fleeta Kathie Jomarie LOISE, MD  levothyroxine  (SYNTHROID ) 75 MCG tablet 1 PILL EVERY MORNING EXCEPT SUNDAYS 05/06/24   Motwani, Komal, MD  metoprolol  succinate (TOPROL -XL) 50 MG 24 hr tablet Take 50 mg by mouth daily. 08/09/20   [provider]  ofloxacin (OCUFLOX) 0.3 % ophthalmic solution Place 1 drop into the right eye 4 (four) times daily. 11/10/23   [provider]  omeprazole  (PRILOSEC) 40 MG capsule  TAKE 1 CAPSULE BY MOUTH EVERY MORNING AND AT BEDTIME 03/25/24   Alvia Bring, DO  prednisoLONE  acetate (PRED FORTE ) 1 % ophthalmic suspension Place 1 drop into the right eye 4 (four) times daily. 11/10/23   [provider]  rosuvastatin  (CRESTOR ) 40 MG tablet TAKE 1 TABLET BY MOUTH EVERY DAY 06/05/23   Alvia Bring, DO  silver  sulfADIAZINE  (SILVADENE ) 1 % cream Apply 1 Application topically daily. 10/31/22   McCaughan, Dia D, [name]  sitaGLIPtin  (JANUVIA ) 50 MG tablet Take 1 tablet (50 mg total) by mouth daily. 06/22/23   Motwani, Komal, MD  spironolactone  (ALDACTONE ) 25 MG tablet TAKE 1/2 TABLET BY MOUTH EVERY DAY 03/15/24   Walker, Caitlin S, NP    Allergies: Shellfish allergy    Review of Systems  Updated Vital Signs BP 131/71   Pulse 65   Temp 98.3 F (36.8 C) (Oral)   Resp 18   SpO2 98%   Physical Exam  (all labs ordered are listed, but only abnormal results are displayed) Labs Reviewed  CBC WITH DIFFERENTIAL/PLATELET - Abnormal; Notable for the following components:      Result Value   RBC 2.98 (*)    Hemoglobin 9.6 (*)    HCT 28.0 (*)    All other components within normal limits  COMPREHENSIVE METABOLIC PANEL WITH GFR - Abnormal; Notable for the following components:   CO2 21 (*)    Glucose, Bld 166 (*)    BUN 60 (*)    Creatinine, Ser 3.57 (*)    Total Protein 8.9 (*)    GFR, Estimated 17 (*)    All other components within normal limits  URINALYSIS, ROUTINE W REFLEX MICROSCOPIC    EKG: None  Radiology: DG Hip Unilat  With Pelvis 2-3 Views Right Result Date: 05/10/2024 EXAM: 2 or 3 VIEW(S) XRAY OF THE PELVIS AND RIGHT HIP 05/10/2024 06:32:20 PM COMPARISON: Left hip CT 04/01/2020. CLINICAL HISTORY: Injury-related pain after a fall. FINDINGS: BONES AND JOINTS: SI joints are symmetric. Bilateral total hip arthroplasty is in place. Possible acute nondisplaced fracture of the right greater trochanter. Bilateral hips demonstrate normal alignment. SOFT TISSUES:  Surgical clips are present in the lower pelvis. Vascular calcifications are noted. LUMBAR SPINE: Degenerative changes of the lumbar spine. IMPRESSION: 1. Possible acute nondisplaced fracture of the right greater trochanter. Electronically signed by: Greig Pique MD 05/10/2024 07:24 PM EST RP Workstation: [id number]    {Document cardiac monitor, telemetry assessment procedure when appropriate:32947} Procedures   Medications Ordered in the ED  oxyCODONE -acetaminophen  (PERCOCET/ROXICET) 5-325 MG per tablet 1 tablet (1 tablet Oral Given 05/10/24 1759)  fentaNYL  (SUBLIMAZE ) injection 25 mcg (25 mcg Intravenous Given 05/10/24 2307)      {Click here for ABCD2, HEART and other calculators REFRESH Note before signing:1}  Medical Decision Making Amount and/or Complexity of Data Reviewed Labs: ordered. Radiology: ordered.  Risk Prescription drug management.   ***  {Document critical care time when appropriate  Document review of labs and clinical decision tools ie CHADS2VASC2, etc  Document your independent review of radiology images and any outside records  Document your discussion with family members, caretakers and with consultants  Document social determinants of health affecting pt's care  Document your decision making why or why not admission, treatments were needed:32947:::1}   Final diagnoses:  None    ED Discharge Orders     None        "

## 2024-05-10 NOTE — ED Triage Notes (Signed)
 Fall slip on ice around 3pm Right hip pain Denies hitting head no LOC Able to move leg but painful

## 2024-05-10 NOTE — ED Provider Notes (Signed)
 " [place] EMERGENCY DEPARTMENT AT [facility] Provider Note   CSN: [id number] Arrival date & time: 05/10/24  1737     Patient presents with: [name]   BRENDA COWHER is a 79 y.o. male.   HPI     79 year old male with a history of type 2 diabetes, hyper cholesterolemia, subclinical hypothyroidism, CKD, coronary artery disease who presents with concern for right hip pain after a fall.   Reports that he slipped on the ice around 3 PM onto his right hip.  He has right hip pain.  He denies any other injuries from the fall.  His left shoulder felt sore for a little bit but feels improved.  He denies any chest pain, abdominal pain, numbness, weakness, knee pain, other injuries.  He denies any recent illness.  He does report that he has had headaches frequently at night.  He is not sure if he hit his head because it happened so quickly.  Did not lose consciousness.  Past Medical History:  Diagnosis Date   Arthritis    Asthma    no inhaler   Atrial fibrillation (HCC) 10/16/2020   CKD (chronic kidney disease) 04/13/2020   Coronary artery disease    cardiologist-  dr spruill; last visit 3 mos ago per pt   Enterobacter sepsis (HCC) 04/13/2020   GERD (gastroesophageal reflux disease)    History of MI (myocardial infarction)    1985   Hydronephrosis, left    Hypertension    Myocardial infarction [facility])    Nephrolithiasis    left   Neuromuscular disorder (HCC)    TINGLING IN BOTH HANDS   Presence of tooth-root and mandibular implants    lower dental implants   Prostate cancer (HCC)    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 Authorizing Provider  albuterol  (VENTOLIN   HFA) 108 (90 Base) MCG/ACT inhaler Inhale 2 puffs into the lungs every 6 (six) hours as needed for wheezing or shortness of breath. 06/09/21  Yes Alvia Bring, DO  allopurinol  (ZYLOPRIM ) 100 MG tablet TAKE 1/2 TABLET BY MOUTH DAILY 03/13/24  Yes Alvia Bring, DO  amiodarone  (PACERONE ) 200 MG tablet Take 200 mg by mouth daily. Dr. Levern   Yes [provider]  amLODipine  (NORVASC ) 10 MG tablet TAKE 1 TABLET BY MOUTH EVERY DAY 02/05/24  Yes Alvia Bring, DO  aspirin  EC 81 MG tablet Take 81 mg by mouth daily. Swallow whole.   Yes [provider]  Bromfenac Sodium 0.09 % SOLN Place 1 drop into the right eye daily. 05/09/24  Yes [provider]  Cholecalciferol (VITAMIN D3) 1.25 MG (50000 UT) CAPS Take 50,000 Units by mouth once a week. 12/31/23  Yes [provider]  docusate sodium  (COLACE) 100 MG capsule Take 100 mg by mouth daily as needed for mild constipation.   Yes [provider]  empagliflozin  (JARDIANCE ) 25 MG TABS tablet Take 1 tablet (25 mg total) by mouth daily before breakfast. 04/17/24  Yes Motwani, Komal, MD  ferrous sulfate  325 (65 FE) MG tablet Take 325 mg by mouth daily with breakfast.   Yes [provider]  furosemide  (LASIX ) 20 MG tablet  TAKE 1 TABLET BY MOUTH DAILY. PLEASE KEEP UPCOMING APPOINTMENT FOR ADDITIONAL REFILLS, THANK YOU! Patient taking differently: Take 20 mg by mouth daily. 05/07/24  Yes Vannie Reche RAMAN, NP  glipiZIDE  2.5 MG TABS Take 2.5 tablets by mouth at bedtime. 03/26/24  Yes [provider]  levofloxacin  (LEVAQUIN ) 500 MG tablet Take 1 tablet by mouth daily. Patient taking differently: Take 500 mg by mouth every other day. 04/01/24  Yes Fleeta Rothman, Jomarie SAILOR, MD  levothyroxine  (SYNTHROID ) 75 MCG tablet 1 PILL EVERY MORNING EXCEPT SUNDAYS Patient taking differently: Take 75 mcg by mouth See admin instructions. 1 pill every morning except Sundays 05/06/24  Yes Motwani, Komal, MD  metoprolol  succinate  (TOPROL -XL) 50 MG 24 hr tablet Take 50 mg by mouth daily. 08/09/20  Yes [provider]  omeprazole  (PRILOSEC) 40 MG capsule TAKE 1 CAPSULE BY MOUTH EVERY MORNING AND AT BEDTIME Patient taking differently: Take 40 mg by mouth in the morning and at bedtime. 03/25/24  Yes Alvia Bring, DO  rosuvastatin  (CRESTOR ) 40 MG tablet TAKE 1 TABLET BY MOUTH EVERY DAY 06/05/23  Yes Alvia Bring, DO  sitaGLIPtin  (JANUVIA ) 50 MG tablet Take 1 tablet (50 mg total) by mouth daily. 06/22/23  Yes Motwani, Komal, MD  spironolactone  (ALDACTONE ) 25 MG tablet TAKE 1/2 TABLET BY MOUTH EVERY DAY 03/15/24  Yes Vannie Reche RAMAN, NP  AMBULATORY NON FORMULARY MEDICATION Please provide rollator walker with hand brakes and seat.  Dx: R26.81 Gait instability 07/11/23   Alvia Bring, DO  chlorthalidone (HYGROTON) 25 MG tablet Take 25 mg by mouth 3 (three) times a week. Patient not taking: Reported on 05/11/2024 03/15/23   [provider]  colchicine  0.6 MG tablet Take 1 tablet (0.6 mg total) by mouth daily. Please bill discount card vs insurance- BIN [phone], PCN [employer], Group [phone], Member ID [id number] Patient not taking: Reported on 05/11/2024 05/09/23   Alvia Bring, DO  Continuous Glucose Sensor (FREESTYLE LIBRE 3 PLUS SENSOR) MISC CHANGE SENSOR EVERY 15 DAYS 03/12/24   Alvia Bring, DO  hydrALAZINE  (APRESOLINE ) 50 MG tablet Take 50 mg by mouth 2 (two) times daily. Patient not taking: Reported on 05/11/2024 06/10/23   [provider]  ofloxacin (OCUFLOX) 0.3 % ophthalmic solution Place 1 drop into the right eye 4 (four) times daily. Patient not taking: Reported on 05/11/2024 11/10/23   [provider]  prednisoLONE  acetate (PRED FORTE ) 1 % ophthalmic suspension Place 1 drop into the right eye 4 (four) times daily. Patient not taking: Reported on 05/11/2024 11/10/23   [provider]  silver  sulfADIAZINE  (SILVADENE ) 1 % cream Apply 1 Application topically daily. Patient not taking: Reported on  05/11/2024 10/31/22   McCaughan, Dia D, [name]    Allergies: Shellfish allergy    Review of Systems  Updated Vital Signs BP (!) 111/57 (BP Location: Right Arm)   Pulse 60   Temp 98.9 F (37.2 C) (Oral)   Resp 19   Ht 5' 11 (1.803 m)   Wt 97.1 kg   SpO2 98%   BMI 29.86 kg/m   Physical Exam Vitals and nursing note reviewed.  Constitutional:      General: He is not in acute distress.    Appearance: He is well-developed. He is not diaphoretic.  HENT:     Head: Normocephalic and atraumatic.  Eyes:     Conjunctiva/sclera: Conjunctivae normal.  Cardiovascular:     Rate and Rhythm: Normal rate and regular rhythm.     Heart sounds: Normal heart sounds. No  murmur heard.    No friction rub. No gallop.  Pulmonary:     Effort: Pulmonary effort is normal. No respiratory distress.     Breath sounds: Normal breath sounds. No wheezing or rales.  Abdominal:     General: There is no distension.     Palpations: Abdomen is soft.     Tenderness: There is no abdominal tenderness. There is no guarding.  Musculoskeletal:        General: Tenderness (right hip) present.     Cervical back: Normal range of motion.  Skin:    General: Skin is warm and dry.  Neurological:     Mental Status: He is alert and oriented to person, place, and time.     Cranial Nerves: No cranial nerve deficit.     Sensory: No sensory deficit.     (all labs ordered are listed, but only abnormal results are displayed) Labs Reviewed  CBC WITH DIFFERENTIAL/PLATELET - Abnormal; Notable for the following components:      Result Value   RBC 2.98 (*)    Hemoglobin 9.6 (*)    HCT 28.0 (*)    All other components within normal limits  COMPREHENSIVE METABOLIC PANEL WITH GFR - Abnormal; Notable for the following components:   CO2 21 (*)    Glucose, Bld 166 (*)    BUN 60 (*)    Creatinine, Ser 3.57 (*)    Total Protein 8.9 (*)    GFR, Estimated 17 (*)    All other components within normal limits  URINALYSIS, ROUTINE W  REFLEX MICROSCOPIC - Abnormal; Notable for the following components:   Glucose, UA >1,000 (*)    Hgb urine dipstick TRACE (*)    Protein, ur TRACE (*)    All other components within normal limits  GLUCOSE, CAPILLARY - Abnormal; Notable for the following components:   Glucose-Capillary 148 (*)    All other components within normal limits  GLUCOSE, CAPILLARY - Abnormal; Notable for the following components:   Glucose-Capillary 167 (*)    All other components within normal limits  GLUCOSE, CAPILLARY - Abnormal; Notable for the following components:   Glucose-Capillary 230 (*)    All other components within normal limits  GLUCOSE, CAPILLARY - Abnormal; Notable for the following components:   Glucose-Capillary 138 (*)    All other components within normal limits  BASIC METABOLIC PANEL WITH GFR  CBC    EKG: None  Radiology: CT Head Wo Contrast Result Date: 05/11/2024 EXAM: CT HEAD WITHOUT CONTRAST 05/10/2024 11:52:25 PM TECHNIQUE: CT of the head was performed without the administration of intravenous contrast. Automated exposure control, iterative reconstruction, and/or weight based adjustment of the mA/kV was utilized to reduce the radiation dose to as low as reasonably achievable. COMPARISON: 2 / 25 / 25 CLINICAL HISTORY: Head trauma, minor (age >= 65y). FINDINGS: BRAIN AND VENTRICLES: No acute hemorrhage. No evidence of acute infarct. No hydrocephalus. No extra-axial collection. No mass effect or midline shift. Atherosclerotic calcifications within the cavernous internal carotid arteries. ORBITS: Bilateral lens replacement. SINUSES: Complete opacification of right maxillary sinus. SOFT TISSUES AND SKULL: No acute soft tissue abnormality. No skull fracture. IMPRESSION: 1. No acute intracranial abnormality. 2. Chronic right maxillary sinusitis. Electronically signed by: Norman Gatlin MD 05/11/2024 01:14 AM EST RP Workstation: [id number]   CT Cervical Spine Wo Contrast Result Date:  05/11/2024 EXAM: CT CERVICAL SPINE WITHOUT CONTRAST 05/10/2024 11:52:25 PM TECHNIQUE: CT of the cervical spine was performed without the administration of intravenous contrast. Multiplanar reformatted images  are provided for review. Automated exposure control, iterative reconstruction, and/or weight based adjustment of the mA/kV was utilized to reduce the radiation dose to as low as reasonably achievable. COMPARISON: Comparison with 05/10/2023. CLINICAL HISTORY: Neck trauma (Age >= 65y). Neck trauma. Age: >= 65 years. FINDINGS: BONES AND ALIGNMENT: No acute fracture or traumatic malalignment. DEGENERATIVE CHANGES: Multilevel age commensurate spondylosis. Advanced facet arthropathy on the right at C7-T1. No cervical spinal canal narrowing. SOFT TISSUES: No prevertebral soft tissue swelling. IMPRESSION: 1. No evidence of acute traumatic injury. Electronically signed by: Norman Gatlin MD 05/11/2024 01:12 AM EST RP Workstation: [id number]   CT Hip Right Wo Contrast Result Date: 05/11/2024 EXAM: CT OF THE RIGHT HIP WITHOUT IV CONTRAST 05/10/2024 11:52:25 PM TECHNIQUE: CT of the right hip was performed without the administration of intravenous contrast. Multiplanar reformatted images are provided for review. Automated exposure control, iterative reconstruction, and/or weight based adjustment of the mA/kV was utilized to reduce the radiation dose to as low as reasonably achievable. COMPARISON: Right hip X-ray 04/13/2024. CLINICAL HISTORY: Hip trauma, fracture suspected, X-ray done. FINDINGS: BONES: Right hip total arthroplasty is present in anatomic alignment. No evidence for hardware loosening. There is an acute comminuted fracture of the greater trochanter and anterior proximal diaphysis. Anterior osseous fragments are offset 1 cm anteriorly. No aggressive appearing osseous abnormality or periostitis. SOFT TISSUE: Peripheral vascular calcifications are present. No significant soft tissue edema or fluid collections.  No soft tissue mass. JOINT: Right hip total arthroplasty is present in anatomic alignment. No osseous erosions. INTRAPELVIC CONTENTS: Limited images of the intrapelvic contents are unremarkable. IMPRESSION: 1. Acute comminuted fracture of the greater trochanter and anterior proximal diaphysis with 1 cm anterior offset of osseous fragments. 2. Right hip total arthroplasty in anatomic alignment without evidence of hardware loosening. Electronically signed by: Greig Pique MD 05/11/2024 12:54 AM EST RP Workstation: [id number]   DG Hip Unilat  With Pelvis 2-3 Views Right Result Date: 05/10/2024 EXAM: 2 or 3 VIEW(S) XRAY OF THE PELVIS AND RIGHT HIP 05/10/2024 06:32:20 PM COMPARISON: Left hip CT 04/01/2020. CLINICAL HISTORY: Injury-related pain after a fall. FINDINGS: BONES AND JOINTS: SI joints are symmetric. Bilateral total hip arthroplasty is in place. Possible acute nondisplaced fracture of the right greater trochanter. Bilateral hips demonstrate normal alignment. SOFT TISSUES: Surgical clips are present in the lower pelvis. Vascular calcifications are noted. LUMBAR SPINE: Degenerative changes of the lumbar spine. IMPRESSION: 1. Possible acute nondisplaced fracture of the right greater trochanter. Electronically signed by: Greig Pique MD 05/10/2024 07:24 PM EST RP Workstation: [id number]     Procedures   Medications Ordered in the ED  HYDROmorphone  (DILAUDID ) injection 1 mg (has no administration in time range)  oxyCODONE  (Oxy IR/ROXICODONE ) immediate release tablet 5 mg (5 mg Oral Given 05/11/24 1209)  metoCLOPramide  (REGLAN ) injection 10 mg (has no administration in time range)  0.9 %  sodium chloride  infusion ( Intravenous New Bag/Given 05/11/24 0757)  levofloxacin  (LEVAQUIN ) tablet 500 mg (has no administration in time range)  amLODipine  (NORVASC ) tablet 10 mg (10 mg Oral Given 05/11/24 0959)  metoprolol  succinate (TOPROL -XL) 24 hr tablet 50 mg (50 mg Oral Given 05/11/24 0959)  pantoprazole   (PROTONIX ) EC tablet 40 mg (40 mg Oral Given 05/11/24 0959)  acetaminophen  (TYLENOL ) tablet 650 mg (has no administration in time range)    Or  acetaminophen  (TYLENOL ) suppository 650 mg (has no administration in time range)  traZODone  (DESYREL ) tablet 25 mg (has no administration in time range)  insulin  aspart (novoLOG ) injection 0-15 Units (2  Units Subcutaneous Given 05/11/24 1649)  insulin  aspart (novoLOG ) injection 0-5 Units (has no administration in time range)  artificial tears ophthalmic solution 2 drop (has no administration in time range)  aspirin  EC tablet 81 mg (81 mg Oral Given 05/11/24 1343)  ferrous sulfate  tablet 325 mg (has no administration in time range)  rosuvastatin  (CRESTOR ) tablet 10 mg (10 mg Oral Given 05/11/24 1343)  oxyCODONE -acetaminophen  (PERCOCET/ROXICET) 5-325 MG per tablet 1 tablet (1 tablet Oral Given 05/10/24 1759)  fentaNYL  (SUBLIMAZE ) injection 25 mcg (25 mcg Intravenous Given 05/10/24 2307)  fentaNYL  (SUBLIMAZE ) injection 50 mcg (50 mcg Intravenous Given 05/11/24 0414)                                      79 year old male with a history of type 2 diabetes, hyper cholesterolemia, subclinical hypothyroidism, CKD, coronary artery disease who presents with concern for right hip pain after a fall.   X-ray of the right hip shows possible acute nondisplaced fracture of the right greater trochanter.  Discussed with Dr. Burnetta who is on-call for [facility] and recommends CT for further evaluation.  Patient denies any acute medical concerns.  Will obtain CT right hip, as well as CT head and cervical spine given age with history of fall, he is not certain if he hit his head, and also reports frequent headaches prior to this.  Labs show worsening CKD since last February, stable anemia.  Signed out to Dr. Bari with CTs pending.     Final diagnoses:  Closed fracture of right hip, initial encounter [facility])    ED Discharge Orders     None           Dreama Longs, MD 05/11/24 1747  "

## 2024-05-11 ENCOUNTER — Other Ambulatory Visit: Payer: Self-pay

## 2024-05-11 DIAGNOSIS — S72001A Fracture of unspecified part of neck of right femur, initial encounter for closed fracture: Secondary | ICD-10-CM | POA: Diagnosis not present

## 2024-05-11 LAB — URINALYSIS, ROUTINE W REFLEX MICROSCOPIC
Bacteria, UA: NONE SEEN
Bilirubin Urine: NEGATIVE
Glucose, UA: >1000 mg/dL — AB
Ketones, ur: NEGATIVE mg/dL
Leukocytes,Ua: NEGATIVE
Nitrite: NEGATIVE
Specific Gravity, Urine: 1.02 (ref 1.005–1.030)
pH: 6 (ref 5.0–8.0)

## 2024-05-11 LAB — GLUCOSE, CAPILLARY
Glucose-Capillary: 148 mg/dL — ABNORMAL HIGH (ref 70–99)
Glucose-Capillary: 167 mg/dL — ABNORMAL HIGH (ref 70–99)
Glucose-Capillary: 230 mg/dL — ABNORMAL HIGH (ref 70–99)
Glucose-Capillary: 138 mg/dL — ABNORMAL HIGH (ref 70–99)
Glucose-Capillary: 157 mg/dL — ABNORMAL HIGH (ref 70–99)

## 2024-05-11 MED ORDER — ACETAMINOPHEN 650 MG RE SUPP: 650.0000 mg | Freq: Four times a day (QID) | RECTAL | Status: DC | PRN

## 2024-05-11 MED ORDER — HYDROMORPHONE HCL 1 MG/ML IJ SOLN: 1.0000 mg | INTRAMUSCULAR | Status: DC | PRN

## 2024-05-11 MED ORDER — PREDNISOLONE ACETATE 1 % OP SUSP: 1.0000 [drp] | Freq: Four times a day (QID) | OPHTHALMIC | Status: DC

## 2024-05-11 MED ORDER — ROSUVASTATIN CALCIUM 10 MG PO TABS
10.0000 mg | ORAL_TABLET | Freq: Every day | ORAL | Status: DC
  Administered 2024-05-11 – 2024-05-16 (×6): 10 mg via ORAL
  Filled 2024-05-11 (×6): qty 1

## 2024-05-11 MED ORDER — OXYCODONE HCL 5 MG PO TABS
5.0000 mg | ORAL_TABLET | ORAL | Status: DC | PRN
  Administered 2024-05-11 – 2024-05-12 (×3): 5 mg via ORAL
  Filled 2024-05-11 (×3): qty 1

## 2024-05-11 MED ORDER — INSULIN ASPART 100 UNIT/ML IJ SOLN
0.0000 [IU] | INTRAMUSCULAR | Status: DC
  Administered 2024-05-11: 3 [IU] via SUBCUTANEOUS
  Filled 2024-05-11: qty 3

## 2024-05-11 MED ORDER — INSULIN ASPART 100 UNIT/ML IJ SOLN
0.0000 [IU] | Freq: Three times a day (TID) | INTRAMUSCULAR | Status: DC
  Administered 2024-05-11: 2 [IU] via SUBCUTANEOUS
  Administered 2024-05-11: 5 [IU] via SUBCUTANEOUS
  Administered 2024-05-12: 2 [IU] via SUBCUTANEOUS
  Administered 2024-05-12: 3 [IU] via SUBCUTANEOUS
  Filled 2024-05-11: qty 3
  Filled 2024-05-11: qty 2
  Filled 2024-05-11: qty 3
  Filled 2024-05-11: qty 5

## 2024-05-11 MED ORDER — POLYVINYL ALCOHOL 1.4 % OP SOLN
2.0000 [drp] | OPHTHALMIC | Status: DC | PRN
  Administered 2024-05-13 – 2024-05-15 (×3): 2 [drp] via OPHTHALMIC
  Filled 2024-05-11: qty 15

## 2024-05-11 MED ORDER — INSULIN ASPART 100 UNIT/ML IJ SOLN: 0.0000 [IU] | Freq: Every day | INTRAMUSCULAR | Status: DC

## 2024-05-11 MED ORDER — PANTOPRAZOLE SODIUM 40 MG PO TBEC
40.0000 mg | DELAYED_RELEASE_TABLET | Freq: Every day | ORAL | Status: DC
  Administered 2024-05-11 – 2024-05-16 (×6): 40 mg via ORAL
  Filled 2024-05-11 (×6): qty 1

## 2024-05-11 MED ORDER — ENOXAPARIN SODIUM 40 MG/0.4ML IJ SOSY: 40.0000 mg | PREFILLED_SYRINGE | INTRAMUSCULAR | Status: DC

## 2024-05-11 MED ORDER — FENTANYL CITRATE (PF) 50 MCG/ML IJ SOSY
50.0000 ug | PREFILLED_SYRINGE | Freq: Once | INTRAMUSCULAR | Status: AC
  Administered 2024-05-11: 50 ug via INTRAVENOUS
  Filled 2024-05-11: qty 1

## 2024-05-11 MED ORDER — SODIUM CHLORIDE 0.9 % IV SOLN: INTRAVENOUS | Status: AC

## 2024-05-11 MED ORDER — LEVOFLOXACIN 500 MG PO TABS
500.0000 mg | ORAL_TABLET | ORAL | Status: DC
  Administered 2024-05-12 – 2024-05-16 (×3): 500 mg via ORAL
  Filled 2024-05-11 (×3): qty 1

## 2024-05-11 MED ORDER — METOCLOPRAMIDE HCL 5 MG/ML IJ SOLN: 10.0000 mg | Freq: Four times a day (QID) | INTRAMUSCULAR | Status: DC | PRN

## 2024-05-11 MED ORDER — FERROUS SULFATE 325 (65 FE) MG PO TABS
325.0000 mg | ORAL_TABLET | Freq: Every day | ORAL | Status: DC
  Administered 2024-05-12 – 2024-05-16 (×5): 325 mg via ORAL
  Filled 2024-05-11 (×5): qty 1

## 2024-05-11 MED ORDER — AMLODIPINE BESYLATE 10 MG PO TABS
10.0000 mg | ORAL_TABLET | Freq: Every day | ORAL | Status: DC
  Administered 2024-05-11 – 2024-05-16 (×5): 10 mg via ORAL
  Filled 2024-05-11 (×6): qty 1

## 2024-05-11 MED ORDER — TRAZODONE HCL 50 MG PO TABS
25.0000 mg | ORAL_TABLET | Freq: Every evening | ORAL | Status: DC | PRN
  Administered 2024-05-11: 25 mg via ORAL
  Filled 2024-05-11: qty 1

## 2024-05-11 MED ORDER — ACETAMINOPHEN 325 MG PO TABS: 650.0000 mg | ORAL_TABLET | Freq: Four times a day (QID) | ORAL | Status: DC | PRN

## 2024-05-11 MED ORDER — ASPIRIN 81 MG PO TBEC
81.0000 mg | DELAYED_RELEASE_TABLET | Freq: Every day | ORAL | Status: DC
  Administered 2024-05-11 – 2024-05-16 (×6): 81 mg via ORAL
  Filled 2024-05-11 (×6): qty 1

## 2024-05-11 MED ORDER — ROSUVASTATIN CALCIUM 20 MG PO TABS: 40.0000 mg | ORAL_TABLET | Freq: Every day | ORAL | Status: DC

## 2024-05-11 MED ORDER — METOPROLOL SUCCINATE ER 50 MG PO TB24
50.0000 mg | ORAL_TABLET | Freq: Every day | ORAL | Status: DC
  Administered 2024-05-11 – 2024-05-16 (×5): 50 mg via ORAL
  Filled 2024-05-11 (×6): qty 1

## 2024-05-11 NOTE — Progress Notes (Signed)
 Discussed with orthopedic surgery Dr. Rankin Pizza, who will see the patient in consultation. States this fracture can likely be managed nonoperatively.

## 2024-05-11 NOTE — Progress Notes (Signed)
 Plan of Care Note for accepted transfer   Patient: [name] MRN: [id number]   DOA: 05/10/2024  Facility requesting transfer: [facility] Requesting Provider: Bari Pfeiffer, MD Reason for transfer: Right hip fracture Facility course: 79 year old male with history of type 2 diabetes mellitus, dyslipidemia, subclinical hypothyroidism, CKD and coronary artery disease, s/p right THA, coming with a mechanical fall with subsequent right hip fracture confirmed on hip CT.  The case was discussed with Dr. Burnetta who recommended admission to [facility].  The patient will be kept NPO.  His creatinine is up from 3.06 on 04/02/2019 5-3.57 and BUN is up from 44-60.  H&H were 9.6 and 28 compared to 10.2 and 30.4 then. EKG showed sinus rhythm with a rate of 65 with prolonged PR interval and borderline prolonged QT interval.  Plan of care: The patient is accepted for admission to Telemetry unit, at [facility]..  The patient will be under the care and responsibility of the ER physician until arrival to [facility].  Author: Madison DELENA Peaches, MD 05/11/2024  Check [email or web address] for on-call coverage.  Nursing staff, Please call [facility] Admits & Consults System-Wide number on Amion as soon as patient's arrival, so appropriate admitting provider can evaluate the pt.

## 2024-05-11 NOTE — Plan of Care (Signed)
 ?  Problem: Clinical Measurements: ?Goal: Ability to maintain clinical measurements within normal limits will improve ?Outcome: Progressing ?Goal: Will remain free from infection ?Outcome: Progressing ?Goal: Diagnostic test results will improve ?Outcome: Progressing ?  ?

## 2024-05-11 NOTE — Consult Note (Signed)
 "  ORTHOPAEDIC CONSULTATION  REQUESTING PHYSICIAN: Zella Katha HERO, MD  PCP:  Alvia Bring, DO  Chief Complaint:   HPI: [name] is a 79 y.o. male who complains of left hip pain s/p fall on ice yesterday. He was seen at [facility] ER and transferred to [facility].  He has a history of total hip replacement of the bilateral hips performed by Dr. Hiram.  He states that the right total hip was performed in 2013 and the left total hip in 2010 and required revision for infection in 2024.  He has had no issue with his left hip until this most recent fall. He has pain with movement of the left hip, but no radiation of the pain and no associated numbness or tingling.  He has a history of a fib rate controlled, CKD and CAD with prior MI.   Past Medical History:  Diagnosis Date   Arthritis    Asthma    no inhaler   Atrial fibrillation (HCC) 10/16/2020   CKD (chronic kidney disease) 04/13/2020   Coronary artery disease    cardiologist-  dr spruill; last visit 3 mos ago per pt   Enterobacter sepsis (HCC) 04/13/2020   GERD (gastroesophageal reflux disease)    History of MI (myocardial infarction)    1985   Hydronephrosis, left    Hypertension    Myocardial infarction (HCC)    Nephrolithiasis    left   Neuromuscular disorder (HCC)    TINGLING IN BOTH HANDS   Presence of tooth-root and mandibular implants    lower dental implants   Prostate cancer (HCC)    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   Pruritus 04/01/2024   QT prolongation 10/16/2020   Shortness of breath    WITH EXERTION   Thigh abscess 04/13/2020   Type 2 diabetes mellitus (HCC)    Vaccine counseling 04/28/2022   Xerosis of skin 04/01/2024   Zoster 09/13/2021   Past Surgical History:  Procedure Laterality Date   BUNIONECTOMY     CYSTOSCOPY W/ RETROGRADES Left 03/14/2014   Procedure: CYSTOSCOPY WITH LEFT RETROGRADE PYELOGRAM, Left Ureteroscopy, [place] ureteral Stent No string;  Surgeon: Oliva VEAR Oiler,  MD;  Location: [facility];  Service: Urology;  Laterality: Left;   CYSTOSCOPY W/ URETERAL STENT PLACEMENT Bilateral 01/15/2020   Procedure: CYSTOSCOPY WITH RETROGRADE PYELOGRAM/URETERAL STENT PLACEMENT;  Surgeon: Alvaro Hummer, MD;  Location: [facility];  Service: Urology;  Laterality: Bilateral;   CYSTOSCOPY/URETEROSCOPY/HOLMIUM LASER/STENT PLACEMENT Bilateral 03/17/2020   Procedure: CYSTOSCOPY/URETEROSCOPY/HOLMIUM LASER/STENT LEFT PLACEMENT;  Surgeon: Nieves Cough, MD;  Location: [facility];  Service: Urology;  Laterality: Bilateral;   HERNIA REPAIR     INCISION AND DRAINAGE Left 03/09/2020   Procedure: INCISION AND DRAINAGE LEFT HIP WITH LINER EXCHANGE;  Surgeon: Melodi Lerner, MD;  Location: [facility];  Service: Orthopedics;  Laterality: Left;   IR FLUORO GUIDE CV LINE RIGHT  03/10/2020   IR RADIOLOGIST EVAL & MGMT  04/01/2020   IR RADIOLOGIST EVAL & MGMT  04/15/2020   IR RADIOLOGIST EVAL & MGMT  04/30/2020   IR REMOVAL TUN CV CATH W/O FL  04/22/2020   IR US  GUIDE BX ASP/DRAIN  03/17/2020   IR US  GUIDE VASC ACCESS RIGHT  03/10/2020   PROSTATE BIOPSY     TOTAL HIP ARTHROPLASTY Left 03-20-2009   TOTAL HIP ARTHROPLASTY  04/09/2012   Procedure: TOTAL HIP ARTHROPLASTY;  Surgeon: Lerner LULLA Melodi, MD;  Location: [facility];  Service: Orthopedics;  Laterality: Right;   TOTAL  KNEE ARTHROPLASTY Left 05-16-2008   Social History   Socioeconomic History   Marital status: Widowed    Spouse name: Not on file   Number of children: 5   Years of education: 9   Highest education level: 9th grade  Occupational History   Occupation: Retired  Tobacco Use   Smoking status: Former    Current packs/day: 0.00    Average packs/day: 1 pack/day for 25.0 years (25.0 ttl pk-yrs)    Types: Cigarettes    Start date: 04/12/1959    Quit date: 04/11/1984    Years since quitting: 40.1   Smokeless tobacco: Former    Quit date: 04/02/1985  Vaping Use   Vaping status: Never Used  Substance and Sexual Activity    Alcohol  use: Not Currently    Comment: RARE   Drug use: No   Sexual activity: Not Currently  Other Topics Concern   Not on file  Social History Narrative   Lives alone. He had five children and one has deceased. He enjoys watching television.   Social Drivers of Health   Tobacco Use: Medium Risk (05/10/2024)   Patient History    Smoking Tobacco Use: Former    Smokeless Tobacco Use: Former    Passive Exposure: Not on Actuary Strain: Low Risk (10/03/2023)   Overall Financial Resource Strain (CARDIA)    Difficulty of Paying Living Expenses: Not hard at all  Food Insecurity: No Food Insecurity (05/11/2024)   Epic    Worried About Programme Researcher, Broadcasting/film/video in the Last Year: Never true    Ran Out of Food in the Last Year: Never true  Transportation Needs: No Transportation Needs (05/11/2024)   Epic    Lack of Transportation (Medical): No    Lack of Transportation (Non-Medical): No  Physical Activity: Sufficiently Active (10/03/2023)   Exercise Vital Sign    Days of Exercise per Week: 4 days    Minutes of Exercise per Session: 60 min  Stress: No Stress Concern Present (10/03/2023)   [employer] of Occupational Health - Occupational Stress Questionnaire    Feeling of Stress: Not at all  Social Connections: Moderately Integrated (05/11/2024)   Social Connection and Isolation Panel    Frequency of Communication with Friends and Family: More than three times a week    Frequency of Social Gatherings with Friends and Family: More than three times a week    Attends Religious Services: More than 4 times per year    Active Member of [employer] or Organizations: Not on file    Attends Banker Meetings: More than 4 times per year    Marital Status: Widowed  Depression (PHQ2-9): High Risk (01/10/2024)   Depression (PHQ2-9)    PHQ-2 Score: 11  Alcohol  Screen: Low Risk (10/03/2023)   Alcohol  Screen    Last Alcohol  Screening Score (AUDIT): 0  Housing: Low Risk (05/11/2024)    Epic    Unable to Pay for Housing in the Last Year: No    Number of Times Moved in the Last Year: 0    Homeless in the Last Year: No  Utilities: Not At Risk (05/11/2024)   Epic    Threatened with loss of utilities: No  Health Literacy: Adequate Health Literacy (10/03/2023)   B1300 Health Literacy    Frequency of need for help with medical instructions: Never   Family History  Problem Relation Age of Onset   Cancer Mother        breast  Multiple sclerosis Daughter    Cancer Father        prostate   Diabetes Father    Cancer Maternal Uncle        bone   Allergies[1] Prior to Admission medications  Medication Sig Start Date End Date Taking? Authorizing Provider  albuterol  (VENTOLIN  HFA) 108 (90 Base) MCG/ACT inhaler Inhale 2 puffs into the lungs every 6 (six) hours as needed for wheezing or shortness of breath. 06/09/21  Yes Alvia Bring, DO  allopurinol  (ZYLOPRIM ) 100 MG tablet TAKE 1/2 TABLET BY MOUTH DAILY 03/13/24  Yes Alvia Bring, DO  amiodarone  (PACERONE ) 200 MG tablet Take 200 mg by mouth daily. Dr. Levern   Yes [provider]  amLODipine  (NORVASC ) 10 MG tablet TAKE 1 TABLET BY MOUTH EVERY DAY 02/05/24  Yes Alvia Bring, DO  aspirin  EC 81 MG tablet Take 81 mg by mouth daily. Swallow whole.   Yes [provider]  Bromfenac Sodium 0.09 % SOLN Place 1 drop into the right eye daily. 05/09/24  Yes [provider]  Cholecalciferol (VITAMIN D3) 1.25 MG (50000 UT) CAPS Take 50,000 Units by mouth once a week. 12/31/23  Yes [provider]  docusate sodium  (COLACE) 100 MG capsule Take 100 mg by mouth daily as needed for mild constipation.   Yes [provider]  empagliflozin  (JARDIANCE ) 25 MG TABS tablet Take 1 tablet (25 mg total) by mouth daily before breakfast. 04/17/24  Yes Motwani, Komal, MD  ferrous sulfate  325 (65 FE) MG tablet Take 325 mg by mouth daily with breakfast.   Yes [provider]  furosemide  (LASIX ) 20 MG tablet  TAKE 1 TABLET BY MOUTH DAILY. PLEASE KEEP UPCOMING APPOINTMENT FOR ADDITIONAL REFILLS, THANK YOU! Patient taking differently: Take 20 mg by mouth daily. 05/07/24  Yes Vannie Reche RAMAN, NP  glipiZIDE  2.5 MG TABS Take 2.5 tablets by mouth at bedtime. 03/26/24  Yes [provider]  levofloxacin  (LEVAQUIN ) 500 MG tablet Take 1 tablet by mouth daily. Patient taking differently: Take 500 mg by mouth every other day. 04/01/24  Yes Fleeta Rothman, Jomarie SAILOR, MD  levothyroxine  (SYNTHROID ) 75 MCG tablet 1 PILL EVERY MORNING EXCEPT SUNDAYS Patient taking differently: Take 75 mcg by mouth See admin instructions. 1 pill every morning except Sundays 05/06/24  Yes Motwani, Komal, MD  metoprolol  succinate (TOPROL -XL) 50 MG 24 hr tablet Take 50 mg by mouth daily. 08/09/20  Yes [provider]  omeprazole  (PRILOSEC) 40 MG capsule TAKE 1 CAPSULE BY MOUTH EVERY MORNING AND AT BEDTIME Patient taking differently: Take 40 mg by mouth in the morning and at bedtime. 03/25/24  Yes Alvia Bring, DO  rosuvastatin  (CRESTOR ) 40 MG tablet TAKE 1 TABLET BY MOUTH EVERY DAY 06/05/23  Yes Alvia Bring, DO  sitaGLIPtin  (JANUVIA ) 50 MG tablet Take 1 tablet (50 mg total) by mouth daily. 06/22/23  Yes Motwani, Komal, MD  spironolactone  (ALDACTONE ) 25 MG tablet TAKE 1/2 TABLET BY MOUTH EVERY DAY 03/15/24  Yes Vannie Reche RAMAN, NP  AMBULATORY NON FORMULARY MEDICATION Please provide rollator walker with hand brakes and seat.  Dx: R26.81 Gait instability 07/11/23   Alvia Bring, DO  chlorthalidone (HYGROTON) 25 MG tablet Take 25 mg by mouth 3 (three) times a week. Patient not taking: Reported on 05/11/2024 03/15/23   [provider]  colchicine  0.6 MG tablet Take 1 tablet (0.6 mg total) by mouth daily. Please bill discount card vs insurance- BIN [id number], PCN [id number], Group [id number], Member ID [id number] Patient not taking: Reported on  05/11/2024 05/09/23   Alvia Bring, DO  Continuous Glucose Sensor (FREESTYLE LIBRE 3 PLUS SENSOR)  MISC CHANGE SENSOR EVERY 15 DAYS 03/12/24   Alvia Bring, DO  hydrALAZINE  (APRESOLINE ) 50 MG tablet Take 50 mg by mouth 2 (two) times daily. Patient not taking: Reported on 05/11/2024 06/10/23   [provider]  ofloxacin (OCUFLOX) 0.3 % ophthalmic solution Place 1 drop into the right eye 4 (four) times daily. Patient not taking: Reported on 05/11/2024 11/10/23   [provider]  prednisoLONE  acetate (PRED FORTE ) 1 % ophthalmic suspension Place 1 drop into the right eye 4 (four) times daily. Patient not taking: Reported on 05/11/2024 11/10/23   [provider]  silver  sulfADIAZINE  (SILVADENE ) 1 % cream Apply 1 Application topically daily. Patient not taking: Reported on 05/11/2024 10/31/22   McCaughan, Dia D, [name]   CT Head Wo Contrast Result Date: 05/11/2024 EXAM: CT HEAD WITHOUT CONTRAST 05/10/2024 11:52:25 PM TECHNIQUE: CT of the head was performed without the administration of intravenous contrast. Automated exposure control, iterative reconstruction, and/or weight based adjustment of the mA/kV was utilized to reduce the radiation dose to as low as reasonably achievable. COMPARISON: 2 / 25 / 25 CLINICAL HISTORY: Head trauma, minor (age >= 65y). FINDINGS: BRAIN AND VENTRICLES: No acute hemorrhage. No evidence of acute infarct. No hydrocephalus. No extra-axial collection. No mass effect or midline shift. Atherosclerotic calcifications within the cavernous internal carotid arteries. ORBITS: Bilateral lens replacement. SINUSES: Complete opacification of right maxillary sinus. SOFT TISSUES AND SKULL: No acute soft tissue abnormality. No skull fracture. IMPRESSION: 1. No acute intracranial abnormality. 2. Chronic right maxillary sinusitis. Electronically signed by: Norman Gatlin MD 05/11/2024 01:14 AM EST RP Workstation: [id number]   CT Cervical Spine Wo Contrast Result Date: 05/11/2024 EXAM: CT CERVICAL SPINE WITHOUT CONTRAST 05/10/2024 11:52:25 PM TECHNIQUE: CT of the cervical spine  was performed without the administration of intravenous contrast. Multiplanar reformatted images are provided for review. Automated exposure control, iterative reconstruction, and/or weight based adjustment of the mA/kV was utilized to reduce the radiation dose to as low as reasonably achievable. COMPARISON: Comparison with 05/10/2023. CLINICAL HISTORY: Neck trauma (Age >= 65y). Neck trauma. Age: >= 65 years. FINDINGS: BONES AND ALIGNMENT: No acute fracture or traumatic malalignment. DEGENERATIVE CHANGES: Multilevel age commensurate spondylosis. Advanced facet arthropathy on the right at C7-T1. No cervical spinal canal narrowing. SOFT TISSUES: No prevertebral soft tissue swelling. IMPRESSION: 1. No evidence of acute traumatic injury. Electronically signed by: Norman Gatlin MD 05/11/2024 01:12 AM EST RP Workstation: [id number]   CT Hip Right Wo Contrast Result Date: 05/11/2024 EXAM: CT OF THE RIGHT HIP WITHOUT IV CONTRAST 05/10/2024 11:52:25 PM TECHNIQUE: CT of the right hip was performed without the administration of intravenous contrast. Multiplanar reformatted images are provided for review. Automated exposure control, iterative reconstruction, and/or weight based adjustment of the mA/kV was utilized to reduce the radiation dose to as low as reasonably achievable. COMPARISON: Right hip X-ray 04/13/2024. CLINICAL HISTORY: Hip trauma, fracture suspected, X-ray done. FINDINGS: BONES: Right hip total arthroplasty is present in anatomic alignment. No evidence for hardware loosening. There is an acute comminuted fracture of the greater trochanter and anterior proximal diaphysis. Anterior osseous fragments are offset 1 cm anteriorly. No aggressive appearing osseous abnormality or periostitis. SOFT TISSUE: Peripheral vascular calcifications are present. No significant soft tissue edema or fluid collections. No soft tissue mass. JOINT: Right hip total arthroplasty is present in anatomic alignment. No osseous erosions.  INTRAPELVIC CONTENTS: Limited images of the intrapelvic contents are unremarkable. IMPRESSION:  1. Acute comminuted fracture of the greater trochanter and anterior proximal diaphysis with 1 cm anterior offset of osseous fragments. 2. Right hip total arthroplasty in anatomic alignment without evidence of hardware loosening. Electronically signed by: Greig Pique MD 05/11/2024 12:54 AM EST RP Workstation: [id number]   DG Hip Unilat  With Pelvis 2-3 Views Right Result Date: 05/10/2024 EXAM: 2 or 3 VIEW(S) XRAY OF THE PELVIS AND RIGHT HIP 05/10/2024 06:32:20 PM COMPARISON: Left hip CT 04/01/2020. CLINICAL HISTORY: Injury-related pain after a fall. FINDINGS: BONES AND JOINTS: SI joints are symmetric. Bilateral total hip arthroplasty is in place. Possible acute nondisplaced fracture of the right greater trochanter. Bilateral hips demonstrate normal alignment. SOFT TISSUES: Surgical clips are present in the lower pelvis. Vascular calcifications are noted. LUMBAR SPINE: Degenerative changes of the lumbar spine. IMPRESSION: 1. Possible acute nondisplaced fracture of the right greater trochanter. Electronically signed by: Greig Pique MD 05/10/2024 07:24 PM EST RP Workstation: [id number]    Positive ROS: All other systems have been reviewed and were otherwise negative with the exception of those mentioned in the HPI and as above.  Physical Exam: General: Alert, no acute distress Cardiovascular: No pedal edema Respiratory: No cyanosis, no use of accessory musculature GI: No organomegaly, abdomen is soft and non-tender Skin: No lesions in the area of chief complaint Neurologic: Sensation intact distally Psychiatric: Patient is competent for consent with normal mood and affect Lymphatic: No axillary or cervical lymphadenopathy  MUSCULOSKELETAL: Left lower extremity - skin incisions over the posterior hip well healed, no ecchymosis - tenderness to the lateral hip - pain with ROM of the hip - no tenderness  over the knee or ankle - able to dorsiflex and plantarflex the ankle actively - sensation intact to light touch sural, saphenous, tibial, deep and superficial peroneal nerve distributions - 2+ DP pulse  Imaging:  X-rays: 3 views of  left hip and pelvis were obtained and reviewed.  My independent interpretation is as follows: evidence of a minimally displaced periprosthetic greater trochanteric femur fracture with well fixed stem without evidence of subsidence  CT of the left hip obtained and reviewed. My independent interpretation is as follow: there is a mild to moderately displaced greater trochanteric periprosthetic femur fracture with stable appearance of the femoral stem.  Assessment: 79 year old male with prior bilateral THA with Dr. Hiram now s/p fall with a periprosthetic femur fracture and stable femoral stem.  The patient may be treated nonoperatively for his greater trochanteric periprosthetic fracture with protected weight bearing.  He should take aspirin  81 mg BID x 6 weeks for DVT ppx. The patient expressed understanding and agreement with the plan.  Plan: Protected weight bearing with walker LLE Plan for nonoperative treatment PT/OT for mobilization Aspirin  81 mg BID x 6 weeks for DVT ppx Pain control Bowel regimen: docusate Dispo: ok to follow up with Dr. Hiram in 2 weeks for repeat x-rays left hip    Rankin LELON Pizza, MD    05/11/2024 1:30 PM     [1]  Allergies Allergen Reactions   Shellfish Allergy Rash   "

## 2024-05-11 NOTE — ED Provider Notes (Signed)
 Patient signed out pending CT of the hip.  CT confirms greater trochanteric hip fracture.  Discussed with Dr. Burnetta.  Recommends admission to [facility].  NPO.  Will plan for admission to the hospitalist.    Aaron Charmaine FALCON, MD 05/11/24 [phone]

## 2024-05-11 NOTE — H&P (Signed)
 " History and Physical  RUSHI CHASEN FMW:[id number] DOB: 1945-05-17 DOA: 05/10/2024  PCP: Alvia Bring, DO   Chief Complaint: Fall, right hip pain  HPI: [name] is a 79 y.o. male with medical history significant for atrial fibrillation, CKD stage IIIb, hypertension, CAD, chronic left prosthetic hip infection on long-term antibiotics being admitted to the hospital with a right greater trochanteric hip fracture after a slip and fall on ice yesterday 1/30.  History is provided by the patient, says he was walking outside, he slipped and fell, does not think he hit his head or lost consciousness.  Denies any recent illness.  Had immediate pain in the right hip, he can move it but it is very painful.  Workup in the emergency department as detailed below shows evidence of acute fracture confirmed on CT.  Review of Systems: Please see HPI for pertinent positives and negatives. A complete 10 system review of systems are otherwise negative.  Past Medical History:  Diagnosis Date   Arthritis    Asthma    no inhaler   Atrial fibrillation (HCC) 10/16/2020   CKD (chronic kidney disease) 04/13/2020   Coronary artery disease    cardiologist-  dr spruill; last visit 3 mos ago per pt   Enterobacter sepsis (HCC) 04/13/2020   GERD (gastroesophageal reflux disease)    History of MI (myocardial infarction)    1985   Hydronephrosis, left    Hypertension    Myocardial infarction (HCC)    Nephrolithiasis    left   Neuromuscular disorder (HCC)    TINGLING IN BOTH HANDS   Presence of tooth-root and mandibular implants    lower dental implants   Prostate cancer (HCC)    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   Pruritus 04/01/2024   QT prolongation 10/16/2020   Shortness of breath    WITH EXERTION   Thigh abscess 04/13/2020   Type 2 diabetes mellitus (HCC)    Vaccine counseling 04/28/2022   Xerosis of skin 04/01/2024   Zoster 09/13/2021   Past Surgical History:  Procedure Laterality Date    BUNIONECTOMY     CYSTOSCOPY W/ RETROGRADES Left 03/14/2014   Procedure: CYSTOSCOPY WITH LEFT RETROGRADE PYELOGRAM, Left Ureteroscopy, Lweft ureteral Stent No string;  Surgeon: Oliva VEAR Oiler, MD;  Location: [facility] [place];  Service: Urology;  Laterality: Left;   CYSTOSCOPY W/ URETERAL STENT PLACEMENT Bilateral 01/15/2020   Procedure: CYSTOSCOPY WITH RETROGRADE PYELOGRAM/URETERAL STENT PLACEMENT;  Surgeon: Alvaro Hummer, MD;  Location: [facility];  Service: Urology;  Laterality: Bilateral;   CYSTOSCOPY/URETEROSCOPY/HOLMIUM LASER/STENT PLACEMENT Bilateral 03/17/2020   Procedure: CYSTOSCOPY/URETEROSCOPY/HOLMIUM LASER/STENT LEFT PLACEMENT;  Surgeon: Nieves Cough, MD;  Location: [facility];  Service: Urology;  Laterality: Bilateral;   HERNIA REPAIR     INCISION AND DRAINAGE Left 03/09/2020   Procedure: INCISION AND DRAINAGE LEFT HIP WITH LINER EXCHANGE;  Surgeon: Melodi Lerner, MD;  Location: [facility];  Service: Orthopedics;  Laterality: Left;   IR FLUORO GUIDE CV LINE RIGHT  03/10/2020   IR RADIOLOGIST EVAL & MGMT  04/01/2020   IR RADIOLOGIST EVAL & MGMT  04/15/2020   IR RADIOLOGIST EVAL & MGMT  04/30/2020   IR REMOVAL TUN CV CATH W/O FL  04/22/2020   IR US  GUIDE BX ASP/DRAIN  03/17/2020   IR US  GUIDE VASC ACCESS RIGHT  03/10/2020   PROSTATE BIOPSY     TOTAL HIP ARTHROPLASTY Left 03-20-2009   TOTAL HIP ARTHROPLASTY  04/09/2012   Procedure: TOTAL HIP ARTHROPLASTY;  Surgeon: Lerner LULLA Melodi,  MD;  Location: [facility];  Service: Orthopedics;  Laterality: Right;   TOTAL KNEE ARTHROPLASTY Left 05-16-2008   Social History:  reports that he quit smoking about 40 years ago. His smoking use included cigarettes. He started smoking about 65 years ago. He has a 25 pack-year smoking history. He quit smokeless tobacco use about 39 years ago. He reports that he does not currently use alcohol . He reports that he does not use drugs.  Allergies[1]  Family History  Problem Relation Age of Onset   Cancer Mother         breast   Multiple sclerosis Daughter    Cancer Father        prostate   Diabetes Father    Cancer Maternal Uncle        bone     Prior to Admission medications  Medication Sig Start Date End Date Taking? Authorizing Provider  albuterol  (VENTOLIN  HFA) 108 (90 Base) MCG/ACT inhaler Inhale 2 puffs into the lungs every 6 (six) hours as needed for wheezing or shortness of breath. 06/09/21   Alvia Bring, DO  allopurinol  (ZYLOPRIM ) 100 MG tablet TAKE 1/2 TABLET BY MOUTH DAILY 03/13/24   Alvia Bring, DO  AMBULATORY NON FORMULARY MEDICATION Please provide rollator walker with hand brakes and seat.  Dx: R26.81 Gait instability 07/11/23   Alvia Bring, DO  amiodarone  (PACERONE ) 200 MG tablet Take 200 mg by mouth daily. Dr. Levern    [provider]  amLODipine  (NORVASC ) 10 MG tablet TAKE 1 TABLET BY MOUTH EVERY DAY 02/05/24   Alvia Bring, DO  aspirin  EC 81 MG tablet Take 81 mg by mouth daily. Swallow whole.    [provider]  chlorthalidone (HYGROTON) 25 MG tablet Take 25 mg by mouth 3 (three) times a week. 03/15/23   [provider]  Cholecalciferol (VITAMIN D3) 1.25 MG (50000 UT) CAPS Take 1 capsule by mouth once a week. 12/31/23   [provider]  colchicine  0.6 MG tablet Take 1 tablet (0.6 mg total) by mouth daily. Please bill discount card vs insurance- BIN [phone], PCN [id number], Group [id number], Member ID [id number] 05/09/23   Alvia Bring, DO  Continuous Glucose Sensor (FREESTYLE LIBRE 3 PLUS SENSOR) MISC CHANGE SENSOR EVERY 15 DAYS 03/12/24   Alvia Bring, DO  docusate sodium  (COLACE) 100 MG capsule Take 100 mg by mouth daily as needed for mild constipation.    [provider]  empagliflozin  (JARDIANCE ) 25 MG TABS tablet Take 1 tablet (25 mg total) by mouth daily before breakfast. 04/17/24   Motwani, Obadiah, MD  ferrous sulfate  325 (65 FE) MG tablet Take 325 mg by mouth daily with breakfast.    [provider]  furosemide  (LASIX ) 20 MG tablet  TAKE 1 TABLET BY MOUTH DAILY. PLEASE KEEP UPCOMING APPOINTMENT FOR ADDITIONAL REFILLS, THANK YOU! 05/07/24   Vannie Reche RAMAN, NP  glipiZIDE  2.5 MG TABS Take 1 tablet by mouth as needed. 03/26/24   [provider]  hydrALAZINE  (APRESOLINE ) 50 MG tablet Take 50 mg by mouth 2 (two) times daily. 06/10/23   [provider]  levofloxacin  (LEVAQUIN ) 500 MG tablet Take 1 tablet by mouth daily. 04/01/24   Fleeta Kathie Jomarie LOISE, MD  levothyroxine  (SYNTHROID ) 75 MCG tablet 1 PILL EVERY MORNING EXCEPT SUNDAYS 05/06/24   Motwani, Komal, MD  metoprolol  succinate (TOPROL -XL) 50 MG 24 hr tablet Take 50 mg by mouth daily. 08/09/20   [provider]  ofloxacin (OCUFLOX) 0.3 % ophthalmic solution Place 1 drop into the  right eye 4 (four) times daily. 11/10/23   [provider]  omeprazole  (PRILOSEC) 40 MG capsule TAKE 1 CAPSULE BY MOUTH EVERY MORNING AND AT BEDTIME 03/25/24   Alvia Bring, DO  prednisoLONE  acetate (PRED FORTE ) 1 % ophthalmic suspension Place 1 drop into the right eye 4 (four) times daily. 11/10/23   [provider]  rosuvastatin  (CRESTOR ) 40 MG tablet TAKE 1 TABLET BY MOUTH EVERY DAY 06/05/23   Alvia Bring, DO  silver  sulfADIAZINE  (SILVADENE ) 1 % cream Apply 1 Application topically daily. 10/31/22   McCaughan, Dia D, [name]  sitaGLIPtin  (JANUVIA ) 50 MG tablet Take 1 tablet (50 mg total) by mouth daily. 06/22/23   Dartha Ernst, MD  spironolactone  (ALDACTONE ) 25 MG tablet TAKE 1/2 TABLET BY MOUTH EVERY DAY 03/15/24   Vannie Reche RAMAN, NP    Physical Exam: BP (!) 153/72   Pulse 64   Temp 98.7 F (37.1 C) (Oral)   Resp 19   Ht 5' 11 (1.803 m)   Wt 97.1 kg   SpO2 97%   BMI 29.86 kg/m  General:  Alert, oriented, calm, in no acute distress, resting comfortably pleasant and cooperative Eyes: EOMI, clear conjuctivae, white sclerea Neck: supple, no masses, trachea mildline  Cardiovascular: RRR, no murmurs or rubs, no peripheral edema  Respiratory: clear to  auscultation bilaterally, no wheezes, no crackles  Abdomen: soft, nontender, nondistended, normal bowel tones heard  Skin: dry, no rashes  Musculoskeletal: no joint effusions, range of motion not tested due to right hip fracture Psychiatric: appropriate affect, normal speech  Neurologic: extraocular muscles intact, clear speech, moving all extremities with intact sensorium         Labs on Admission:  Basic Metabolic Panel: Recent Labs  Lab 05/10/24 2308  NA 135  K 4.7  CL 99  CO2 21*  GLUCOSE 166*  BUN 60*  CREATININE 3.57*  CALCIUM  9.2   Liver Function Tests: Recent Labs  Lab 05/10/24 2308  AST 18  ALT 16  ALKPHOS 70  BILITOT 0.4  PROT 8.9*  ALBUMIN 4.1   No results for input(s): LIPASE, AMYLASE in the last 168 hours. No results for input(s): AMMONIA in the last 168 hours. CBC: Recent Labs  Lab 05/10/24 2308  WBC 7.3  NEUTROABS 5.6  HGB 9.6*  HCT 28.0*  MCV 94.0  PLT 278   Cardiac Enzymes: No results for input(s): CKTOTAL, CKMB, CKMBINDEX, TROPONINI in the last 168 hours. BNP (last 3 results) No results for input(s): BNP in the last 8760 hours.  ProBNP (last 3 results) No results for input(s): PROBNP in the last 8760 hours.  CBG: Recent Labs  Lab 05/11/24 0453 05/11/24 0739  GLUCAP 148* 167*    Radiological Exams on Admission: CT Head Wo Contrast Result Date: 05/11/2024 EXAM: CT HEAD WITHOUT CONTRAST 05/10/2024 11:52:25 PM TECHNIQUE: CT of the head was performed without the administration of intravenous contrast. Automated exposure control, iterative reconstruction, and/or weight based adjustment of the mA/kV was utilized to reduce the radiation dose to as low as reasonably achievable. COMPARISON: 2 / 25 / 25 CLINICAL HISTORY: Head trauma, minor (age >= 65y). FINDINGS: BRAIN AND VENTRICLES: No acute hemorrhage. No evidence of acute infarct. No hydrocephalus. No extra-axial collection. No mass effect or midline shift. Atherosclerotic  calcifications within the cavernous internal carotid arteries. ORBITS: Bilateral lens replacement. SINUSES: Complete opacification of right maxillary sinus. SOFT TISSUES AND SKULL: No acute soft tissue abnormality. No skull fracture. IMPRESSION: 1. No acute intracranial abnormality. 2. Chronic right maxillary  sinusitis. Electronically signed by: Norman Gatlin MD 05/11/2024 01:14 AM EST RP Workstation: [id number]   CT Cervical Spine Wo Contrast Result Date: 05/11/2024 EXAM: CT CERVICAL SPINE WITHOUT CONTRAST 05/10/2024 11:52:25 PM TECHNIQUE: CT of the cervical spine was performed without the administration of intravenous contrast. Multiplanar reformatted images are provided for review. Automated exposure control, iterative reconstruction, and/or weight based adjustment of the mA/kV was utilized to reduce the radiation dose to as low as reasonably achievable. COMPARISON: Comparison with 05/10/2023. CLINICAL HISTORY: Neck trauma (Age >= 65y). Neck trauma. Age: >= 65 years. FINDINGS: BONES AND ALIGNMENT: No acute fracture or traumatic malalignment. DEGENERATIVE CHANGES: Multilevel age commensurate spondylosis. Advanced facet arthropathy on the right at C7-T1. No cervical spinal canal narrowing. SOFT TISSUES: No prevertebral soft tissue swelling. IMPRESSION: 1. No evidence of acute traumatic injury. Electronically signed by: Norman Gatlin MD 05/11/2024 01:12 AM EST RP Workstation: [id number]   CT Hip Right Wo Contrast Result Date: 05/11/2024 EXAM: CT OF THE RIGHT HIP WITHOUT IV CONTRAST 05/10/2024 11:52:25 PM TECHNIQUE: CT of the right hip was performed without the administration of intravenous contrast. Multiplanar reformatted images are provided for review. Automated exposure control, iterative reconstruction, and/or weight based adjustment of the mA/kV was utilized to reduce the radiation dose to as low as reasonably achievable. COMPARISON: Right hip X-ray 04/13/2024. CLINICAL HISTORY: Hip trauma, fracture  suspected, X-ray done. FINDINGS: BONES: Right hip total arthroplasty is present in anatomic alignment. No evidence for hardware loosening. There is an acute comminuted fracture of the greater trochanter and anterior proximal diaphysis. Anterior osseous fragments are offset 1 cm anteriorly. No aggressive appearing osseous abnormality or periostitis. SOFT TISSUE: Peripheral vascular calcifications are present. No significant soft tissue edema or fluid collections. No soft tissue mass. JOINT: Right hip total arthroplasty is present in anatomic alignment. No osseous erosions. INTRAPELVIC CONTENTS: Limited images of the intrapelvic contents are unremarkable. IMPRESSION: 1. Acute comminuted fracture of the greater trochanter and anterior proximal diaphysis with 1 cm anterior offset of osseous fragments. 2. Right hip total arthroplasty in anatomic alignment without evidence of hardware loosening. Electronically signed by: Greig Pique MD 05/11/2024 12:54 AM EST RP Workstation: [id number]   DG Hip Unilat  With Pelvis 2-3 Views Right Result Date: 05/10/2024 EXAM: 2 or 3 VIEW(S) XRAY OF THE PELVIS AND RIGHT HIP 05/10/2024 06:32:20 PM COMPARISON: Left hip CT 04/01/2020. CLINICAL HISTORY: Injury-related pain after a fall. FINDINGS: BONES AND JOINTS: SI joints are symmetric. Bilateral total hip arthroplasty is in place. Possible acute nondisplaced fracture of the right greater trochanter. Bilateral hips demonstrate normal alignment. SOFT TISSUES: Surgical clips are present in the lower pelvis. Vascular calcifications are noted. LUMBAR SPINE: Degenerative changes of the lumbar spine. IMPRESSION: 1. Possible acute nondisplaced fracture of the right greater trochanter. Electronically signed by: Greig Pique MD 05/10/2024 07:24 PM EST RP Workstation: [id number]   Assessment/Plan JORDYNN PERRIER is a 79 y.o. male with medical history significant for atrial fibrillation, CKD stage IIIb, hypertension, CAD, chronic left  prosthetic hip infection on long-term antibiotics being admitted to the hospital with a right greater trochanteric hip fracture after a slip and fall on ice yesterday 1/30.   Right greater trochanteric hip fracture-due to mechanical fall on ice yesterday 1/30. -Inpatient admission -Pain and nausea control as needed -Bedrest -ER provider consulted Dr. Burnetta orthopedic surgery -Will keep n.p.o. this morning until surgical plans are elucidated  AKI superimposed on CKD stage IIIb-patient has fluctuating renal function, followed by cardiology and nephrology.  Previous baseline creatinine seems to  be around 2.0, though this was over a year ago, most recent creatinine December 2025 was 3.06. -Hold chlorthalidone, Aldactone  and Lasix  and avoid other nephrotoxins -Hydrate gently with normal saline -Trend renal function with daily labs  Hypertension-continue home amlodipine , patient states hydralazine  was recently discontinued  Chronic left hip prosthetic infection-followed by ID -Continue long-term Levaquin  daily  Type 2 diabetes-hold oral antihyperglycemic's, will place on carb modified diet when able to eat, with sliding scale insulin   Hypothyroidism-Synthroid   Atrial fibrillation-patient not on anticoagulation, states his cardiologist discontinued his amiodarone , appears to be in sinus rhythm per most recent EKG  GERD-omeprazole   DVT prophylaxis: SCDs only due to AKI, and impending surgery.    Code Status: Full Code  Consults called: Orthopedic surgery  Admission status: The appropriate patient status for this patient is INPATIENT. Inpatient status is judged to be reasonable and necessary in order to provide the required intensity of service to ensure the patient's safety. The patient's presenting symptoms, physical exam findings, and initial radiographic and laboratory data in the context of their chronic comorbidities is felt to place them at high risk for further clinical  deterioration. Furthermore, it is not anticipated that the patient will be medically stable for discharge from the hospital within 2 midnights of admission.    I certify that at the point of admission it is my clinical judgment that the patient will require inpatient hospital care spanning beyond 2 midnights from the point of admission due to high intensity of service, high risk for further deterioration and high frequency of surveillance required  Time spent: 53 minutes  Cher Franzoni CHRISTELLA Gail MD [facility] Pager [phone]  If 7PM-7AM, please contact night-coverage [email or web address] Password [email or web address]  05/11/2024, 9:32 AM      [1]  Allergies Allergen Reactions   Shellfish Allergy Rash   "

## 2024-05-11 NOTE — Plan of Care (Signed)
" °  Problem: Education: Goal: Knowledge of General Education information will improve Description: Including pain rating scale, medication(s)/side effects and non-pharmacologic comfort measures Outcome: Progressing   Problem: Health Behavior/Discharge Planning: Goal: Ability to manage health-related needs will improve Outcome: Progressing   Problem: Clinical Measurements: Goal: Ability to maintain clinical measurements within normal limits will improve Outcome: Progressing Goal: Will remain free from infection Outcome: Progressing Goal: Diagnostic test results will improve Outcome: Progressing Goal: Respiratory complications will improve Outcome: Progressing Goal: Cardiovascular complication will be avoided Outcome: Progressing   Problem: Activity: Goal: Risk for activity intolerance will decrease Outcome: Progressing   Problem: Nutrition: Goal: Adequate nutrition will be maintained Outcome: Progressing   Problem: Coping: Goal: Level of anxiety will decrease Outcome: Progressing   Problem: Elimination: Goal: Will not experience complications related to bowel motility Outcome: Progressing Goal: Will not experience complications related to urinary retention Outcome: Progressing   Problem: Pain Managment: Goal: General experience of comfort will improve and/or be controlled Outcome: Progressing   Problem: Safety: Goal: Ability to remain free from injury will improve Outcome: Progressing   Problem: Skin Integrity: Goal: Risk for impaired skin integrity will decrease Outcome: Progressing   Problem: Activity: Goal: Risk for activity intolerance will decrease Outcome: Progressing   Problem: Elimination: Goal: Will not experience complications related to bowel motility Outcome: Progressing   Problem: Safety: Goal: Ability to remain free from injury will improve Outcome: Progressing   "

## 2024-05-12 LAB — GLUCOSE, CAPILLARY
Glucose-Capillary: 147 mg/dL — ABNORMAL HIGH (ref 70–99)
Glucose-Capillary: 179 mg/dL — ABNORMAL HIGH (ref 70–99)
Glucose-Capillary: 187 mg/dL — ABNORMAL HIGH (ref 70–99)
Glucose-Capillary: 170 mg/dL — ABNORMAL HIGH (ref 70–99)

## 2024-05-12 LAB — BASIC METABOLIC PANEL WITH GFR
Anion gap: 11 (ref 5–15)
BUN: 57 mg/dL — ABNORMAL HIGH (ref 8–23)
CO2: 23 mmol/L (ref 22–32)
Calcium: 9 mg/dL (ref 8.9–10.3)
Chloride: 101 mmol/L (ref 98–111)
Creatinine, Ser: 3.41 mg/dL — ABNORMAL HIGH (ref 0.61–1.24)
GFR, Estimated: 18 mL/min — ABNORMAL LOW
Glucose, Bld: 163 mg/dL — ABNORMAL HIGH (ref 70–99)
Potassium: 4.6 mmol/L (ref 3.5–5.1)
Sodium: 134 mmol/L — ABNORMAL LOW (ref 135–145)

## 2024-05-12 LAB — CBC
HCT: 26 % — ABNORMAL LOW (ref 39.0–52.0)
Hemoglobin: 8.6 g/dL — ABNORMAL LOW (ref 13.0–17.0)
MCH: 32.6 pg (ref 26.0–34.0)
MCHC: 33.1 g/dL (ref 30.0–36.0)
MCV: 98.5 fL (ref 80.0–100.0)
Platelets: 234 10*3/uL (ref 150–400)
RBC: 2.64 MIL/uL — ABNORMAL LOW (ref 4.22–5.81)
RDW: 13 % (ref 11.5–15.5)
WBC: 6.2 10*3/uL (ref 4.0–10.5)
nRBC: 0 % (ref 0.0–0.2)

## 2024-05-12 MED ORDER — OXYCODONE HCL 5 MG PO TABS
10.0000 mg | ORAL_TABLET | ORAL | Status: DC | PRN
  Administered 2024-05-12 – 2024-05-16 (×8): 10 mg via ORAL
  Filled 2024-05-12 (×8): qty 2

## 2024-05-12 MED ORDER — EMPAGLIFLOZIN 10 MG PO TABS
10.0000 mg | ORAL_TABLET | Freq: Every day | ORAL | Status: DC
  Administered 2024-05-13: 10 mg via ORAL
  Filled 2024-05-12: qty 1

## 2024-05-12 MED ORDER — ACETAMINOPHEN 500 MG PO TABS
1000.0000 mg | ORAL_TABLET | Freq: Four times a day (QID) | ORAL | Status: DC
  Administered 2024-05-12 – 2024-05-16 (×16): 1000 mg via ORAL
  Filled 2024-05-12 (×16): qty 2

## 2024-05-12 MED ORDER — LINAGLIPTIN 5 MG PO TABS
5.0000 mg | ORAL_TABLET | Freq: Every day | ORAL | Status: DC
  Administered 2024-05-12 – 2024-05-16 (×5): 5 mg via ORAL
  Filled 2024-05-12 (×5): qty 1

## 2024-05-12 MED ORDER — POLYETHYLENE GLYCOL 3350 17 G PO PACK
17.0000 g | PACK | Freq: Two times a day (BID) | ORAL | Status: DC
  Administered 2024-05-12 – 2024-05-16 (×7): 17 g via ORAL
  Filled 2024-05-12 (×8): qty 1

## 2024-05-12 MED ORDER — ACETAMINOPHEN 650 MG RE SUPP: 650.0000 mg | Freq: Four times a day (QID) | RECTAL | Status: DC

## 2024-05-12 MED ORDER — EMPAGLIFLOZIN 25 MG PO TABS: 25.0000 mg | ORAL_TABLET | Freq: Every day | ORAL | Status: DC

## 2024-05-12 MED ORDER — AMIODARONE HCL 200 MG PO TABS
200.0000 mg | ORAL_TABLET | Freq: Every day | ORAL | Status: DC
  Administered 2024-05-13 – 2024-05-16 (×4): 200 mg via ORAL
  Filled 2024-05-12 (×5): qty 1

## 2024-05-12 NOTE — Evaluation (Signed)
 Physical Therapy Evaluation Patient Details Name: [name] MRN: [id number] DOB: 1945-05-05 Today's Date: 05/12/2024  History of Present Illness  79 year old male with prior bilateral THA with Dr. Hiram now s/p fall with a right periprosthetic femur fracture and stable femoral stem.  Per ortho: The patient may be treated nonoperatively for his greater trochanteric periprosthetic fracture with protected weight bearing.  Hx of R THA 2013, L THA 2001, S/P I&D and bearing surface exchange 03/09/20 of L posterior THA, DM, CAD, CKD, MI  Clinical Impression  Pt admitted with above diagnosis.  Pt currently with functional limitations due to the deficits listed below (see PT Problem List). Pt will benefit from acute skilled PT to increase their independence and safety with mobility to allow discharge.  Pt with R periprosthetic femur fracture with plan to treat conservatively.  Pt assisted with mobilizing however only able to tolerate ambulating short distance in room.  Pt then assisted to [facility] per his request and RN notified.  Pt currently at least mod assist (utilized +2 for safety today).  Pt is very unsteady upon standing and initiating gait with antalgic gait pattern (reporting 10/10 pain with movement).  Pt reports he lives with 2 friends that could assist him upon d/c.  However pt is currently increased assist and also has 6 steps to his bedroom.  Patient will benefit from continued inpatient follow up therapy, <3 hours/day.  If pt returns home, would benefit from HHPT and BSC as well as assist with any mobilizing.          If plan is discharge home, recommend the following: A lot of help with walking and/or transfers;A lot of help with bathing/dressing/bathroom;Assistance with cooking/housework;Assist for transportation;Help with stairs or ramp for entrance   Can travel by private vehicle        Equipment Recommendations BSC/3in1  Recommendations for Other Services       Functional Status  Assessment Patient has had a recent decline in their functional status and demonstrates the ability to make significant improvements in function in a reasonable and predictable amount of time.     Precautions / Restrictions Precautions Precautions: Fall Restrictions Weight Bearing Restrictions Per Provider Order: Yes Other Position/Activity Restrictions: protected weight bearing      Mobility  Bed Mobility               General bed mobility comments: pt sitting EOB on arrival    Transfers Overall transfer level: Needs assistance Equipment used: Rolling walker (2 wheels) Transfers: Sit to/from Stand, Bed to chair/wheelchair/BSC Sit to Stand: Mod assist, +2 safety/equipment Stand pivot transfers: Min assist, +2 safety/equipment         General transfer comment: verbal cues for UE and LE positioning for pain control    Ambulation/Gait Ambulation/Gait assistance: Min assist, +2 safety/equipment Gait Distance (Feet): 10 Feet Assistive device: Rolling walker (2 wheels) Gait Pattern/deviations: Step-to pattern, Decreased stance time - right, Antalgic Gait velocity: decr     General Gait Details: pt provided with cues for step to gait and had difficulty advancing R LE so R LE lagged behind and mostly forefoot contact; pt instructed on increased weight bearing through UEs on RW with R stance  Stairs            Wheelchair Mobility     Tilt Bed    Modified Rankin (Stroke Patients Only)       Balance Overall balance assessment: History of Falls  Pertinent Vitals/Pain Pain Assessment Pain Assessment: 0-10 Pain Score: 4  Pain Location: right hip Pain Descriptors / Indicators: Aching, Sore Pain Intervention(s): Repositioned, Monitored during session, Premedicated before session    Home Living Family/patient expects to be discharged to:: Private residence Living Arrangements: Other  relatives Available Help at Discharge: Friend(s);Family Type of Home: House Home Access: Level entry     Alternate Level Stairs-Number of Steps: 6 to his bedroom Home Layout: Two level;Bed/bath upstairs Home Equipment: Rolling Walker (2 wheels);Wheelchair - manual      Prior Function Prior Level of Function : Independent/Modified Independent                     Extremity/Trunk Assessment        Lower Extremity Assessment Lower Extremity Assessment: RLE deficits/detail RLE Deficits / Details: pt uses UEs to self assist with positioning and placement due to pain       Communication   Communication Communication: No apparent difficulties    Cognition Arousal: Alert Behavior During Therapy: WFL for tasks assessed/performed   PT - Cognitive impairments: No apparent impairments                         Following commands: Intact       Cueing       General Comments      Exercises     Assessment/Plan    PT Assessment Patient needs continued PT services  PT Problem List Decreased mobility;Decreased strength;Decreased knowledge of precautions;Decreased balance;Decreased activity tolerance;Decreased knowledge of use of DME;Pain       PT Treatment Interventions Gait training;DME instruction;Functional mobility training;Therapeutic activities;Therapeutic exercise;Stair training;Patient/family education    PT Goals (Current goals can be found in the Care Plan section)  Acute Rehab PT Goals PT Goal Formulation: With patient Time For Goal Achievement: 05/19/24 Potential to Achieve Goals: Good    Frequency Min 3X/week     Co-evaluation               AM-PAC PT 6 Clicks Mobility  Outcome Measure Help needed turning from your back to your side while in a flat bed without using bedrails?: A Little Help needed moving from lying on your back to sitting on the side of a flat bed without using bedrails?: A Little Help needed moving to and from  a bed to a chair (including a wheelchair)?: A Lot Help needed standing up from a chair using your arms (e.g., wheelchair or bedside chair)?: A Lot Help needed to walk in hospital room?: A Lot Help needed climbing 3-5 steps with a railing? : Total 6 Click Score: 13    End of Session Equipment Utilized During Treatment: Gait belt Activity Tolerance: Patient tolerated treatment well Patient left: with call bell/phone within reach;Other (comment) (with call bell, pt to call for assist after using [facility]) Nurse Communication: Mobility status (aware pt on BSC, utilize +2 assist for safety) PT Visit Diagnosis: Unsteadiness on feet (R26.81);Difficulty in walking, not elsewhere classified (R26.2);Pain Pain - Right/Left: Right Pain - part of body: Hip    Time: 8586-8571 PT Time Calculation (min) (ACUTE ONLY): 15 min   Charges:   PT Evaluation $PT Eval Low Complexity: 1 Low   PT General Charges $$ ACUTE PT VISIT: 1 Visit        Tari PT, DPT Physical Therapist Acute Rehabilitation Services Office: [phone]   Tari CROME Payson 05/12/2024, 2:52 PM

## 2024-05-12 NOTE — Progress Notes (Signed)
 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 Helga managed by Dr. ALusio as well for recurrence of purulence from area and had been on levofloxacin  suppression  1/30 presented [facility] patient fell on the ice on 1/30 able to move R leg but painful-CT of hip showed fracture-BUN/creatinine up over baseline at 60/3.5 (44/3.0 previously)--hemoglobin 10.2 platelet 305 Orthopedics consulted and recommended nonoperative management aspirin  81 twice daily for 6 weeks with pain control   Assessment  & Plan :    R Hip fracture periprosthetic fracture unstable femoral stem He can have protected weightbearing with walker to left lower extremity per Ortho and DVT prophylaxis aspirin  81 twice daily for 6 weeks His pain is not controlled and is 10/10 and he has not been up with therapy so I have scheduled Tylenol  1000 every 6, I have increased his oxycodone  from 5 every 4 as needed to 10 Q3 as needed and he can have Dilaudid  for severe pain in the IV 1 mg every 3 as needed Therapy to see him and make decisions about planning etc.  Left hip septic arthritis since 2021 Continues on Levaquin  for suppression sees Dr. Lindia needs outpatient follow-up with them and will follow-up with Dr. Theotis as an outpatient  Paroxysmal A-flutter not on anticoagulation CHADVASC >4 Continues on metoprolol  50 XL daily, not on blood thinner Resume amiodarone   200 daily Monitor trends  HFpEF Last echo EF 55-60% no wall motion abnormality At this time hold Aldactone  0.5, Lasix  20, Jardiance  25 and reassess in the next 24 hours--- holding chlorthalidone 25 3 times daily as well as hydralazine  50 twice daily as not taking Continue amlodipine  10  Prostate cancer Outpatient surveillance PSA etc. as per urology/his oncologist  Previous nephrolithiasis with stent placements Outpatient follow-up-stable at this time  diabetes mellitus type 2 With sitagliptin  50 resume with drug formulary replacement Tradjenta  5 Holding glipizide  2.5 at bedtime for now Resume Jardiance  25 CBGs are predominantly below 180 so I have stopped sliding scale and coverage   Data Reviewed today: Sodium 134 potassium 4.6 BUN/creatinine 57/3.4 WBC 6.2 hemoglobin 8.6 platelet 234  DVT prophylaxis: Aspirin  81 twice daily  Dispo/Global plan: Await mobility and discussion about the same     Subjective:   Coherent awake alert no distress  Is having moderate pain in his right hip is unable to straight leg raise has not been out of bed states that the pain is 10/10 when he puts the leg down  Objective + exam Vitals:   05/11/24 0500 05/11/24 1244 05/11/24 2058 05/12/24 0627  BP: (!) 153/72 (!) 111/57 127/78 (!) 116/55  Pulse: 64 60 62 63  Resp: 19  18 16   Temp: 98.7 F (37.1 C) 98.9 F (37.2 C) 98.8 F (37.1 C) 98.3 F (36.8 C)  TempSrc: Oral Oral Oral Oral  SpO2:  98% 97% 99%  Weight: 97.1 kg     Height: 5' 11 (1.803 m)      Filed  Weights   05/11/24 0500  Weight: 97.1 kg     Examination: EOMI NCAT no icterus no pallor no wheeze no rales rhonchi CTAB no added sound ROM intact moving 4 limbs equally except for right which is painful to passive ROM and extension flexion S1-S2 no murmur seems to be in sinus   Scheduled Meds:  amLODipine   10 mg Oral Daily   aspirin  EC  81 mg Oral Daily   ferrous sulfate   325 mg Oral Q breakfast   insulin  aspart  0-15  Units Subcutaneous TID WC   insulin  aspart  0-5 Units Subcutaneous QHS   levofloxacin   500 mg Oral QODAY   metoprolol  succinate  50 mg Oral Daily   pantoprazole   40 mg Oral Daily   rosuvastatin   10 mg Oral Daily   Continuous Infusions: acetaminophen  **OR** acetaminophen , artificial tears, HYDROmorphone  (DILAUDID ) injection, metoCLOPramide  (REGLAN ) injection, oxyCODONE , traZODone   I spent 36 minutes today before, during and after this patient interview and examination--reviewing pertinent data, coordinating the patient's care and in communication with the care team and other medical professionals  Colen Grimes, MD  [facility]

## 2024-05-12 NOTE — Plan of Care (Signed)
   Problem: Education: Goal: Knowledge of General Education information will improve Description: Including pain rating scale, medication(s)/side effects and non-pharmacologic comfort measures Outcome: Progressing   Problem: Health Behavior/Discharge Planning: Goal: Ability to manage health-related needs will improve Outcome: Progressing   Problem: Clinical Measurements: Goal: Respiratory complications will improve Outcome: Progressing Goal: Cardiovascular complication will be avoided Outcome: Progressing   Problem: Activity: Goal: Risk for activity intolerance will decrease Outcome: Progressing   Problem: Nutrition: Goal: Adequate nutrition will be maintained Outcome: Progressing   Problem: Coping: Goal: Level of anxiety will decrease Outcome: Progressing   Problem: Elimination: Goal: Will not experience complications related to urinary retention Outcome: Progressing

## 2024-05-13 LAB — GLUCOSE, CAPILLARY
Glucose-Capillary: 134 mg/dL — ABNORMAL HIGH (ref 70–99)
Glucose-Capillary: 141 mg/dL — ABNORMAL HIGH (ref 70–99)
Glucose-Capillary: 143 mg/dL — ABNORMAL HIGH (ref 70–99)
Glucose-Capillary: 146 mg/dL — ABNORMAL HIGH (ref 70–99)
Glucose-Capillary: 157 mg/dL — ABNORMAL HIGH (ref 70–99)
Glucose-Capillary: 158 mg/dL — ABNORMAL HIGH (ref 70–99)
Glucose-Capillary: 163 mg/dL — ABNORMAL HIGH (ref 70–99)

## 2024-05-13 MED ORDER — SPIRONOLACTONE 12.5 MG HALF TABLET
12.5000 mg | ORAL_TABLET | Freq: Every day | ORAL | Status: DC
  Administered 2024-05-13: 12.5 mg via ORAL
  Filled 2024-05-13 (×2): qty 1

## 2024-05-13 MED ORDER — FUROSEMIDE 20 MG PO TABS
20.0000 mg | ORAL_TABLET | Freq: Every day | ORAL | Status: DC
  Filled 2024-05-13: qty 1

## 2024-05-13 NOTE — Progress Notes (Signed)
 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 Helga managed by Dr. ALusio as well for recurrence of purulence from area and had been on levofloxacin  suppression  1/30 presented [facility] patient fell on the ice on 1/30 able to move R leg but painful-CT of hip showed fracture-BUN/creatinine up over baseline at 60/3.5 (44/3.0 previously)--hemoglobin 10.2 platelet 305 Orthopedics consulted and recommended nonoperative management aspirin  81 twice daily for 6 weeks with pain control   Assessment  & Plan :    R Hip fracture periprosthetic fracture unstable femoral stem He can have protected weightbearing with walker to left lower extremity per Ortho and DVT prophylaxis aspirin  81 twice daily for 6 weeks Better controlled on Tylenol  1000 every 6, I have increased his oxycodone  10 Q3 ER-not requiring Dilaudid  so I have discontinued Needs skilled care as unable to navigate/mobilize and needing to plus assistance TOC consulted  Left hip septic arthritis since 2021 Continues on Levaquin  for suppression sees Dr. Lindia needs outpatient follow-up with them and will follow-up with Dr. Hiram as an outpatient  Paroxysmal A-flutter not on anticoagulation CHADVASC >4 Continues on metoprolol  50 XL daily, not on blood thinner-he is not sure why Resume amiodarone  200 daily Monitor trends  HFpEF Last echo EF 55-60% no wall motion  abnormality Aldactone  12.5 Lasix  20 resumed not taking chlorthalidone or hydralazine  therefore held Continue amlodipine  10  Prostate cancer Outpatient surveillance PSA etc. as per urology/his oncologist HE has some azotemia--see below  AKi on admit Possibly 2/2 meds? PTA lasix  aldactone  jardiance  Held all but aldactone  Would recheck in am labs and bladder scan today.  If > 200 would cath   Previous nephrolithiasis with stent placements Outpatient follow-up-stable at this time  diabetes mellitus type 2 With sitagliptin  50 resume with drug formulary replacement Tradjenta  5 Holding glipizide  2.5 at bedtime for now Resume Jardiance  25 CBGs  = 1 40-1 60 eating 100% of meals   Data Reviewed today: No labs today  DVT prophylaxis: Aspirin  81 twice daily  Dispo/Global plan: Await mobility and discussion about the same     Subjective:   Coherent awake alert no distress  Moderate pain but a little bit better than yesterday No chest pain no fever   Objective + exam Vitals:   05/12/24 1440 05/12/24 2009 05/13/24 0432 05/13/24 0958  BP: (!) 125/57 127/69 136/63 112/62  Pulse: 73 62 66 61  Resp: 17 17 16    Temp: 98.9 F (37.2 C) 98.6 F (37 C) 98.4 F (36.9 C) 98.3 F (36.8 C)  TempSrc: Oral Oral Oral Oral  SpO2: 100% 95% 97% 97%  Weight:      Height:       Filed Weights   05/11/24 0500  Weight: 97.1 kg     Examination: EOMI NCAT no icterus no pallor no wheeze no rales rhonchi CTAB no added sound ROM intact moving 4 limbs equally except for right-this is a little less painful S1-S2 no murmur  Scheduled Meds:  acetaminophen   1,000 mg Oral Q6H   Or   acetaminophen   650 mg Rectal Q6H   amiodarone   200 mg Oral Daily   amLODipine   10 mg Oral Daily   aspirin  EC  81 mg Oral Daily   empagliflozin   10 mg Oral QAC breakfast   ferrous sulfate   325 mg Oral Q breakfast   levofloxacin   500 mg Oral QODAY   linagliptin   5 mg Oral Daily   metoprolol  succinate  50 mg Oral  Daily   pantoprazole   40 mg Oral Daily   polyethylene glycol  17 g Oral BID   rosuvastatin   10 mg Oral Daily   Continuous Infusions: artificial tears, HYDROmorphone  (DILAUDID ) injection, oxyCODONE , traZODone   I spent 36 minutes today before, during and after this patient interview and examination--reviewing pertinent data, coordinating the patient's care and in communication with the care team and other medical professionals  Colen Grimes, MD  [facility]

## 2024-05-13 NOTE — Plan of Care (Signed)

## 2024-05-14 ENCOUNTER — Inpatient Hospital Stay (HOSPITAL_COMMUNITY)

## 2024-05-14 LAB — CBC WITH DIFFERENTIAL/PLATELET
Abs Immature Granulocytes: 0.02 10*3/uL (ref 0.00–0.07)
Basophils Absolute: 0 10*3/uL (ref 0.0–0.1)
Basophils Relative: 0 %
Eosinophils Absolute: 0.2 10*3/uL (ref 0.0–0.5)
Eosinophils Relative: 3 %
HCT: 25.3 % — ABNORMAL LOW (ref 39.0–52.0)
Hemoglobin: 8.2 g/dL — ABNORMAL LOW (ref 13.0–17.0)
Immature Granulocytes: 0 %
Lymphocytes Relative: 16 %
Lymphs Abs: 1.1 10*3/uL (ref 0.7–4.0)
MCH: 32 pg (ref 26.0–34.0)
MCHC: 32.4 g/dL (ref 30.0–36.0)
MCV: 98.8 fL (ref 80.0–100.0)
Monocytes Absolute: 0.8 10*3/uL (ref 0.1–1.0)
Monocytes Relative: 11 %
Neutro Abs: 4.9 10*3/uL (ref 1.7–7.7)
Neutrophils Relative %: 70 %
Platelets: 270 10*3/uL (ref 150–400)
RBC: 2.56 MIL/uL — ABNORMAL LOW (ref 4.22–5.81)
RDW: 12.9 % (ref 11.5–15.5)
WBC: 7.1 10*3/uL (ref 4.0–10.5)
nRBC: 0 % (ref 0.0–0.2)

## 2024-05-14 LAB — GLUCOSE, CAPILLARY
Glucose-Capillary: 127 mg/dL — ABNORMAL HIGH (ref 70–99)
Glucose-Capillary: 127 mg/dL — ABNORMAL HIGH (ref 70–99)
Glucose-Capillary: 130 mg/dL — ABNORMAL HIGH (ref 70–99)
Glucose-Capillary: 153 mg/dL — ABNORMAL HIGH (ref 70–99)
Glucose-Capillary: 168 mg/dL — ABNORMAL HIGH (ref 70–99)
Glucose-Capillary: 168 mg/dL — ABNORMAL HIGH (ref 70–99)

## 2024-05-14 LAB — BASIC METABOLIC PANEL WITH GFR
Anion gap: 14 (ref 5–15)
BUN: 62 mg/dL — ABNORMAL HIGH (ref 8–23)
CO2: 21 mmol/L — ABNORMAL LOW (ref 22–32)
Calcium: 9 mg/dL (ref 8.9–10.3)
Chloride: 99 mmol/L (ref 98–111)
Creatinine, Ser: 3.13 mg/dL — ABNORMAL HIGH (ref 0.61–1.24)
GFR, Estimated: 20 mL/min — ABNORMAL LOW
Glucose, Bld: 136 mg/dL — ABNORMAL HIGH (ref 70–99)
Potassium: 4.5 mmol/L (ref 3.5–5.1)
Sodium: 134 mmol/L — ABNORMAL LOW (ref 135–145)

## 2024-05-14 MED ORDER — SODIUM CHLORIDE 0.9 % IV SOLN: INTRAVENOUS | Status: AC

## 2024-05-14 NOTE — NC FL2 (Signed)
 " [place]  MEDICAID FL2 LEVEL OF CARE FORM     IDENTIFICATION  Patient Name: [name] Birthdate: 1945-11-08 Sex: male Admission Date (Current Location): 05/10/2024  [facility] and [place] Number:  Producer, Television/film/video and Address:  [facility],  [address], [place] [address]      Provider Number: [id number]  Attending Physician Name and Address:  Royal Sill, MD  Relative Name and Phone Number:  Trudy Priestly  Daughter, Emergency Contact  [phone] [facility] Phone)    Current Level of Care: Hospital Recommended Level of Care: Skilled Nursing Facility Prior Approval Number:    Date Approved/Denied:   PASRR Number: [id number]  Discharge Plan: SNF    Current Diagnoses: Patient Active Problem List   Diagnosis Date Noted   Closed right hip fracture (HCC) 05/11/2024   Pruritus 04/01/2024   Xerosis of skin 04/01/2024   Skin tag 01/10/2024   Hypothyroid 01/10/2024   Effusion, right knee 11/08/2022   Gait instability 09/30/2022   Lower extremity edema 08/02/2022   Neck pain 08/02/2022   Osteoarthritis 05/03/2022   Vaccine counseling 04/28/2022   Zoster 09/13/2021   Itching 04/08/2021   Atrial fibrillation (HCC) 10/16/2020   QT prolongation 10/16/2020   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   Thigh abscess 04/13/2020   CKD3b 04/13/2020   Hip pain 03/03/2020   Gout of right hand 02/23/2020   Hyperkalemia 01/14/2020   Hydroureteronephrosis 01/14/2020   Colon cancer screening 04/28/2019   Intermittent claudication 01/22/2019   GERD (gastroesophageal reflux disease) 10/23/2018   Pseudophakia 08/11/2016   Hyperlipidemia associated with type 2 diabetes mellitus (HCC) 06/26/2016   Angina pectoris 06/26/2016   Type 2 diabetes mellitus with chronic kidney disease (HCC) 06/24/2016   HTN (hypertension) 06/24/2016   CAD (coronary artery disease) 06/24/2016   Asthma 06/24/2016   Malignant neoplasm of prostate (HCC) 12/28/2015    Orientation  RESPIRATION BLADDER Height & Weight     Self, Time, Situation, Place  Normal Continent Weight: 97.1 kg Height:  5' 11 (180.3 cm)  BEHAVIORAL SYMPTOMS/MOOD NEUROLOGICAL BOWEL NUTRITION STATUS      Continent Diet (Carb modified)  AMBULATORY STATUS COMMUNICATION OF NEEDS Skin   Limited Assist Verbally Normal                       Personal Care Assistance Level of Assistance  Bathing, Feeding, Dressing Bathing Assistance: Limited assistance Feeding assistance: Limited assistance Dressing Assistance: Limited assistance     Functional Limitations Info  Sight, Hearing, Speech Sight Info: Adequate Hearing Info: Adequate Speech Info: Adequate    SPECIAL CARE FACTORS FREQUENCY  PT (By licensed PT), OT (By licensed OT)     PT Frequency: 5x/wk OT Frequency: 5x/wk            Contractures Contractures Info: Not present    Additional Factors Info  Code Status, Allergies, Psychotropic Code Status Info: Full code Allergies Info: Shellfish Allergy Psychotropic Info: N/A         Current Medications (05/14/2024):  This is the current hospital active medication list Current Facility-Administered Medications  Medication Dose Route Frequency Provider Last Rate Last Admin   0.9 %  sodium chloride  infusion   Intravenous Continuous Samtani, Jai-Gurmukh, MD       acetaminophen  (TYLENOL ) tablet 1,000 mg  1,000 mg Oral Q6H Samtani, Jai-Gurmukh, MD   1,000 mg at 05/14/24 1228   Or   acetaminophen  (TYLENOL ) suppository 650 mg  650 mg Rectal Q6H Samtani, Jai-Gurmukh, MD  amiodarone  (PACERONE ) tablet 200 mg  200 mg Oral Daily Samtani, Jai-Gurmukh, MD   200 mg at 05/14/24 9045   amLODipine  (NORVASC ) tablet 10 mg  10 mg Oral Daily Zella, Mir M, MD   10 mg at 05/14/24 9045   artificial tears ophthalmic solution 2 drop  2 drop Both Eyes PRN Zella Katha HERO, MD   2 drop at 05/13/24 9187   aspirin  EC tablet 81 mg  81 mg Oral Daily Ikramullah, Mir M, MD   81 mg at 05/14/24 9045    ferrous sulfate  tablet 325 mg  325 mg Oral Q breakfast Zella, Mir M, MD   325 mg at 05/14/24 9045   HYDROmorphone  (DILAUDID ) injection 1 mg  1 mg Intravenous Q3H PRN Zella, Mir M, MD       levofloxacin  (LEVAQUIN ) tablet 500 mg  500 mg Oral QODAY Ikramullah, Mir M, MD   500 mg at 05/14/24 9045   linagliptin  (TRADJENTA ) tablet 5 mg  5 mg Oral Daily Samtani, Jai-Gurmukh, MD   5 mg at 05/14/24 9045   metoprolol  succinate (TOPROL -XL) 24 hr tablet 50 mg  50 mg Oral Daily Ikramullah, Mir M, MD   50 mg at 05/14/24 9045   oxyCODONE  (Oxy IR/ROXICODONE ) immediate release tablet 10 mg  10 mg Oral Q3H PRN Samtani, Jai-Gurmukh, MD   10 mg at 05/14/24 9045   pantoprazole  (PROTONIX ) EC tablet 40 mg  40 mg Oral Daily Ikramullah, Mir M, MD   40 mg at 05/14/24 9045   polyethylene glycol (MIRALAX  / GLYCOLAX ) packet 17 g  17 g Oral BID Samtani, Jai-Gurmukh, MD   17 g at 05/13/24 0810   rosuvastatin  (CRESTOR ) tablet 10 mg  10 mg Oral Daily Ikramullah, Mir M, MD   10 mg at 05/14/24 9046   traZODone  (DESYREL ) tablet 25 mg  25 mg Oral QHS PRN Zella Katha HERO, MD   25 mg at 05/11/24 2106     Discharge Medications: Please see discharge summary for a list of discharge medications.  Relevant Imaging Results:  Relevant Lab Results:   Additional Information SSN: [id number]  Alfonse JONELLE Rex, RN     "

## 2024-05-15 DIAGNOSIS — S72001A Fracture of unspecified part of neck of right femur, initial encounter for closed fracture: Secondary | ICD-10-CM | POA: Diagnosis not present

## 2024-05-15 LAB — CBC WITH DIFFERENTIAL/PLATELET
Abs Immature Granulocytes: 0.03 10*3/uL (ref 0.00–0.07)
Basophils Absolute: 0 10*3/uL (ref 0.0–0.1)
Basophils Relative: 0 %
Eosinophils Absolute: 0.2 10*3/uL (ref 0.0–0.5)
Eosinophils Relative: 4 %
HCT: 23.8 % — ABNORMAL LOW (ref 39.0–52.0)
Hemoglobin: 7.7 g/dL — ABNORMAL LOW (ref 13.0–17.0)
Immature Granulocytes: 1 %
Lymphocytes Relative: 15 %
Lymphs Abs: 0.9 10*3/uL (ref 0.7–4.0)
MCH: 32.4 pg (ref 26.0–34.0)
MCHC: 32.4 g/dL (ref 30.0–36.0)
MCV: 100 fL (ref 80.0–100.0)
Monocytes Absolute: 0.8 10*3/uL (ref 0.1–1.0)
Monocytes Relative: 14 %
Neutro Abs: 3.7 10*3/uL (ref 1.7–7.7)
Neutrophils Relative %: 66 %
Platelets: 268 10*3/uL (ref 150–400)
RBC: 2.38 MIL/uL — ABNORMAL LOW (ref 4.22–5.81)
RDW: 13 % (ref 11.5–15.5)
WBC: 5.6 10*3/uL (ref 4.0–10.5)
nRBC: 0 % (ref 0.0–0.2)

## 2024-05-15 LAB — BASIC METABOLIC PANEL WITH GFR
Anion gap: 13 (ref 5–15)
BUN: 66 mg/dL — ABNORMAL HIGH (ref 8–23)
CO2: 22 mmol/L (ref 22–32)
Calcium: 8.8 mg/dL — ABNORMAL LOW (ref 8.9–10.3)
Chloride: 101 mmol/L (ref 98–111)
Creatinine, Ser: 3.03 mg/dL — ABNORMAL HIGH (ref 0.61–1.24)
GFR, Estimated: 20 mL/min — ABNORMAL LOW
Glucose, Bld: 114 mg/dL — ABNORMAL HIGH (ref 70–99)
Potassium: 4.2 mmol/L (ref 3.5–5.1)
Sodium: 135 mmol/L (ref 135–145)

## 2024-05-15 NOTE — Plan of Care (Signed)
  Problem: Nutrition: Goal: Adequate nutrition will be maintained Outcome: Completed/Met

## 2024-05-15 NOTE — Progress Notes (Signed)
 Mobility Specialist - Progress Note   05/15/24 1246  Mobility  Activity Ambulated with assistance  Level of Assistance Contact guard assist, steadying assist  Assistive Device Front wheel walker  Distance Ambulated (ft) 60 ft  Range of Motion/Exercises Active  RLE Weight Bearing Per Provider Order WBAT  Activity Response Tolerated fair  Mobility visit 1 Mobility  Mobility Specialist Start Time (ACUTE ONLY) 1230  Mobility Specialist Stop Time (ACUTE ONLY) 1246  Mobility Specialist Time Calculation (min) (ACUTE ONLY) 16 min   Pt was found on recliner chair and agreeable to mobilize. Stated pain got better with ambulation. At EOS returned to recliner chair with all needs met. Call bell in reach.  Erminio Leos,  Mobility Specialist Can be reached via Secure Chat

## 2024-05-15 NOTE — TOC Progression Note (Addendum)
 Transition of Care [facility]) - Progression Note    Patient Details  Name: [name] MRN: [id number] Date of Birth: 1945-04-12  Transition of Care [facility]) CM/SW Contact  Alfonse JONELLE Rex, RN Phone Number: 05/15/2024, 8:45 AM  Clinical Narrative:   SNF bed offer received from patient's preferred facility, [facility]. Call to [employer], spoke with Jori, request for short term rehab/SNF and non emergency transport, auth pending.   -2:53pm Call from [facility] with [employer], reports SNF request approved x 7 days Auth ID [id number], request for non emergency ambulance sent for medical review, await determination.     Expected Discharge Plan: Skilled Nursing Facility Barriers to Discharge: Continued Medical Work up               Expected Discharge Plan and Services       Living arrangements for the past 2 months: Single Family Home                                       Social Drivers of Health (SDOH) Interventions SDOH Screenings   Food Insecurity: No Food Insecurity (05/11/2024)  Housing: Low Risk (05/11/2024)  Transportation Needs: No Transportation Needs (05/11/2024)  Utilities: Not At Risk (05/11/2024)  Alcohol  Screen: Low Risk (10/03/2023)  Depression (PHQ2-9): High Risk (01/10/2024)  Financial Resource Strain: Low Risk (10/03/2023)  Physical Activity: Sufficiently Active (10/03/2023)  Social Connections: Moderately Integrated (05/11/2024)  Stress: No Stress Concern Present (10/03/2023)  Tobacco Use: Medium Risk (05/10/2024)  Health Literacy: Adequate Health Literacy (10/03/2023)    Readmission Risk Interventions    05/14/2024    2:39 PM  Readmission Risk Prevention Plan  Transportation Screening Complete  PCP or Specialist Appt within 5-7 Days Complete  Home Care Screening Complete  Medication Review (RN CM) Complete

## 2024-05-15 NOTE — Progress Notes (Signed)
 " PROGRESS NOTE    [name]  FMW:[id number] DOB: Jul 09, 1945 DOA: 05/10/2024 PCP: Alvia Bring, DO   Brief Narrative:   79 y.o. male with medical history significant for atrial fibrillation, CKD stage IIIb, hypertension, CAD, chronic left prosthetic hip infection on long-term antibiotics was admitted with a right greater trochanteric hip fracture after a slip and fall on ice on 05/10/2024.  Orthopedics recommended nonoperative conservative management.  PT recommended SNF placement.  Assessment & Plan:   Right greater trochanteric hip fracture after a mechanical fall -Orthopedics recommended nonoperative conservative management.  PT recommended SNF placement.  Currently stable for discharge to SNF. - Pain management.  Fall precautions.  AKI on CKD stage IIIb - Most recent creatinine in December 2025 was 3.06; prior creatinine over a year ago was around 2 - Creatinine improving to 3.03 today.  Currently on gentle IV fluids.  Repeat a.m. creatinine  Anemia of chronic disease - From chronic illnesses.  Hemoglobin stable.  Monitor intermittently  Chronic left hip prosthetic infection - Follows up with ID as an outpatient.  Continue oral Levaquin   Hypertension Hyperlipidemia - Continue amlodipine , metoprolol  succinate and statin  Paroxysmal atrial flutter -Oral anticoagulation.  Currently rate controlled.  Continue amiodarone  and metoprolol  succinate  Chronic diastolic heart failure - Compensated.  Strict input and output.  Daily weights.  Diuretics held because of AKI.  Apparently not taking hydralazine .  Outpatient follow-up with cardiology  History of prostate cancer - Outpatient follow-up with urology/oncology  Diabetes mellitus type 2 -Blood sugars stable.  Continue linagliptin .  Glipizide  and Jardiance  on hold.  GERD -Continue PPI  DVT prophylaxis: SCDs Code Status: Full Family Communication: None at bedside Disposition Plan: Status is: Inpatient Remains  inpatient appropriate because: Of severity of illness    Consultants: Orthopedics  Procedures: None  Antimicrobials: Levaquin    Subjective: Patient seen and examined at bedside.  Continues to have intermittent right hip pain.  No fever, vomiting, worsening shortness of breath reported.  Objective: Vitals:   05/14/24 0517 05/14/24 1731 05/14/24 2004 05/15/24 0516  BP: (!) 131/59 (!) 112/55 135/68 129/61  Pulse: 68 66 65 (!) 58  Resp: 18  18 18   Temp: 98.3 F (36.8 C) 98.5 F (36.9 C) 98.6 F (37 C) 98.1 F (36.7 C)  TempSrc: Oral Oral Oral Oral  SpO2: 100% 96% 98% 99%  Weight:      Height:        Intake/Output Summary (Last 24 hours) at 05/15/2024 1034 Last data filed at 05/15/2024 0630 Gross per 24 hour  Intake 1714.82 ml  Output 1600 ml  Net 114.82 ml   Filed Weights   05/11/24 0500  Weight: 97.1 kg    Examination:  General exam: Appears calm and comfortable  Respiratory system: Bilateral decreased breath sounds at bases Cardiovascular system: S1 & S2 heard, mild intermittent bradycardia present Gastrointestinal system: Abdomen is nondistended, soft and nontender. Normal bowel sounds heard. Extremities: No cyanosis, clubbing, edema  Central nervous system: Alert and oriented. No focal neurological deficits. Moving extremities Skin: No rashes, lesions or ulcers Psychiatry: Affect is mostly flat.  Not agitated.   Data Reviewed: I have personally reviewed following labs and imaging studies  CBC: Recent Labs  Lab 05/10/24 2308 05/12/24 0536 05/14/24 0507 05/15/24 0510  WBC 7.3 6.2 7.1 5.6  NEUTROABS 5.6  --  4.9 3.7  HGB 9.6* 8.6* 8.2* 7.7*  HCT 28.0* 26.0* 25.3* 23.8*  MCV 94.0 98.5 98.8 100.0  PLT 278 234 270 268  Basic Metabolic Panel: Recent Labs  Lab 05/10/24 2308 05/12/24 0536 05/14/24 0507 05/15/24 0510  NA 135 134* 134* 135  K 4.7 4.6 4.5 4.2  CL 99 101 99 101  CO2 21* 23 21* 22  GLUCOSE 166* 163* 136* 114*  BUN 60* 57* 62* 66*   CREATININE 3.57* 3.41* 3.13* 3.03*  CALCIUM  9.2 9.0 9.0 8.8*   GFR: Estimated Creatinine Clearance: 23.9 mL/min (A) (by C-G formula based on SCr of 3.03 mg/dL (H)). Liver Function Tests: Recent Labs  Lab 05/10/24 2308  AST 18  ALT 16  ALKPHOS 70  BILITOT 0.4  PROT 8.9*  ALBUMIN 4.1   No results for input(s): LIPASE, AMYLASE in the last 168 hours. No results for input(s): AMMONIA in the last 168 hours. Coagulation Profile: No results for input(s): INR, PROTIME in the last 168 hours. Cardiac Enzymes: No results for input(s): CKTOTAL, CKMB, CKMBINDEX, TROPONINI in the last 168 hours. BNP (last 3 results) No results for input(s): PROBNP in the last 8760 hours. HbA1C: No results for input(s): HGBA1C in the last 72 hours. CBG: Recent Labs  Lab 05/14/24 0732 05/14/24 1118 05/14/24 1526 05/14/24 1959 05/14/24 2337  GLUCAP 168* 168* 127* 153* 130*   Lipid Profile: No results for input(s): CHOL, HDL, LDLCALC, TRIG, CHOLHDL, LDLDIRECT in the last 72 hours. Thyroid  Function Tests: No results for input(s): TSH, T4TOTAL, FREET4, T3FREE, THYROIDAB in the last 72 hours. Anemia Panel: No results for input(s): VITAMINB12, FOLATE, FERRITIN, TIBC, IRON, RETICCTPCT in the last 72 hours. Sepsis Labs: No results for input(s): PROCALCITON, LATICACIDVEN in the last 168 hours.  No results found for this or any previous visit (from the past 240 hours).       Radiology Studies: DG Sacrum/Coccyx Result Date: 05/14/2024 CLINICAL DATA:  Clemens on Friday, pain EXAM: SACRUM AND COCCYX - 2+ VIEW COMPARISON:  05/10/2024 FINDINGS: Frontal and lateral views of the sacrum and coccyx are obtained. There are no acute displaced sacral or coccygeal fractures. Sacroiliac joints are unremarkable. Bones are diffusely osteopenic. Bilateral hip arthroplasties are noted. IMPRESSION: 1. No evidence of sacrococcygeal fracture. Electronically Signed   By:  Ozell Daring M.D.   On: 05/14/2024 20:08        Scheduled Meds:  acetaminophen   1,000 mg Oral Q6H   Or   acetaminophen   650 mg Rectal Q6H   amiodarone   200 mg Oral Daily   amLODipine   10 mg Oral Daily   aspirin  EC  81 mg Oral Daily   ferrous sulfate   325 mg Oral Q breakfast   levofloxacin   500 mg Oral QODAY   linagliptin   5 mg Oral Daily   metoprolol  succinate  50 mg Oral Daily   pantoprazole   40 mg Oral Daily   polyethylene glycol  17 g Oral BID   rosuvastatin   10 mg Oral Daily   Continuous Infusions:  sodium chloride  40 mL/hr at 05/14/24 1735          Sophie Mao, MD [facility] 05/15/2024, 10:34 AM   "

## 2024-05-16 DIAGNOSIS — S72001A Fracture of unspecified part of neck of right femur, initial encounter for closed fracture: Secondary | ICD-10-CM | POA: Diagnosis not present

## 2024-05-16 LAB — CBC WITH DIFFERENTIAL/PLATELET
Abs Immature Granulocytes: 0.02 10*3/uL (ref 0.00–0.07)
Basophils Absolute: 0 10*3/uL (ref 0.0–0.1)
Basophils Relative: 0 %
Eosinophils Absolute: 0.3 10*3/uL (ref 0.0–0.5)
Eosinophils Relative: 6 %
HCT: 22.9 % — ABNORMAL LOW (ref 39.0–52.0)
Hemoglobin: 7.6 g/dL — ABNORMAL LOW (ref 13.0–17.0)
Immature Granulocytes: 0 %
Lymphocytes Relative: 14 %
Lymphs Abs: 0.7 10*3/uL (ref 0.7–4.0)
MCH: 32.9 pg (ref 26.0–34.0)
MCHC: 33.2 g/dL (ref 30.0–36.0)
MCV: 99.1 fL (ref 80.0–100.0)
Monocytes Absolute: 0.7 10*3/uL (ref 0.1–1.0)
Monocytes Relative: 15 %
Neutro Abs: 3 10*3/uL (ref 1.7–7.7)
Neutrophils Relative %: 65 %
Platelets: 274 10*3/uL (ref 150–400)
RBC: 2.31 MIL/uL — ABNORMAL LOW (ref 4.22–5.81)
RDW: 12.8 % (ref 11.5–15.5)
WBC: 4.6 10*3/uL (ref 4.0–10.5)
nRBC: 0 % (ref 0.0–0.2)

## 2024-05-16 LAB — BASIC METABOLIC PANEL WITH GFR
Anion gap: 11 (ref 5–15)
BUN: 61 mg/dL — ABNORMAL HIGH (ref 8–23)
CO2: 22 mmol/L (ref 22–32)
Calcium: 8.8 mg/dL — ABNORMAL LOW (ref 8.9–10.3)
Chloride: 103 mmol/L (ref 98–111)
Creatinine, Ser: 2.89 mg/dL — ABNORMAL HIGH (ref 0.61–1.24)
GFR, Estimated: 22 mL/min — ABNORMAL LOW
Glucose, Bld: 133 mg/dL — ABNORMAL HIGH (ref 70–99)
Potassium: 4.7 mmol/L (ref 3.5–5.1)
Sodium: 136 mmol/L (ref 135–145)

## 2024-05-16 LAB — MAGNESIUM: Magnesium: 3.4 mg/dL — ABNORMAL HIGH (ref 1.7–2.4)

## 2024-05-16 MED ORDER — OXYCODONE HCL 5 MG PO TABS: 5.0000 mg | ORAL_TABLET | Freq: Four times a day (QID) | ORAL | 0 refills | Status: AC | PRN

## 2024-05-16 MED ORDER — LEVOFLOXACIN 500 MG PO TABS: 500.0000 mg | ORAL_TABLET | ORAL | Status: AC

## 2024-05-16 MED ORDER — POLYETHYLENE GLYCOL 3350 17 G PO PACK: 17.0000 g | PACK | Freq: Two times a day (BID) | ORAL | Status: AC

## 2024-05-16 MED ORDER — ACETAMINOPHEN 500 MG PO TABS: 1000.0000 mg | ORAL_TABLET | Freq: Four times a day (QID) | ORAL | Status: AC

## 2024-05-16 MED ORDER — ASPIRIN 81 MG PO TBEC: 81.0000 mg | DELAYED_RELEASE_TABLET | Freq: Two times a day (BID) | ORAL | Status: AC

## 2024-05-16 NOTE — Discharge Summary (Signed)
 Physician Discharge Summary  [name] FMW:[id number] DOB: [birth year] DOA: 05/10/2024  PCP: Aaron Bring, DO  Admit date: 05/10/2024 Discharge date: 05/16/2024  Admitted From: Home Disposition: SNF  Recommendations for Outpatient Follow-up:  Follow up with SNF provider at earliest convenience Outpatient follow-up with orthopedics Follow up in ED if symptoms worsen or new appear   Home Health: No Equipment/Devices: None  Discharge Condition: Stable CODE STATUS: Full Diet recommendation: Carb modified  Brief/Interim Summary: 79 y.o. male with medical history significant for atrial fibrillation, CKD stage IIIb, hypertension, CAD, chronic left prosthetic hip infection on long-term antibiotics was admitted with a right greater trochanteric hip fracture after a slip and fall on ice on 05/10/2024.  Orthopedics recommended nonoperative conservative management.  PT recommended SNF placement.  He will be discharged to SNF once bed is available.  Discharge Diagnoses:   Right greater trochanteric hip fracture after a mechanical fall -Orthopedics recommended nonoperative conservative management.  PT recommended SNF placement.  Currently stable for discharge to SNF. - Pain management.  Fall precautions. - He will be discharged to SNF once bed is available.   AKI on CKD stage IIIb - Most recent creatinine in December 2025 was 3.06; prior creatinine over a year ago was around 2 - Creatinine improving to 2.89 today.  Off IV fluids.  Outpatient follow-up of creatinine   Anemia of chronic disease - From chronic illnesses.  Hemoglobin stable.  Monitor intermittently as an outpatient   Chronic left hip prosthetic infection - Follows up with ID as an outpatient.  Continue oral Levaquin    Hypertension Hyperlipidemia - Continue amlodipine , metoprolol  succinate and statin   Paroxysmal atrial flutter - Not on oral anticoagulation.  Currently rate controlled.  Continue amiodarone  and  metoprolol  succinate   Chronic diastolic heart failure - Compensated.  Continue diet and fluid restriction.  Diuretics held because of AKI.  Apparently not taking hydralazine .  Outpatient follow-up with cardiology   History of prostate cancer - Outpatient follow-up with urology/oncology   Diabetes mellitus type 2 -Blood sugars stable.  Continue linagliptin .  Resume glipizide  on discharge.  Keep Jardiance  on hold.   GERD -Continue PPI   Discharge Instructions  Discharge Instructions     Diet general   Complete by: As directed    Increase activity slowly   Complete by: As directed       Allergies as of 05/16/2024       Reactions   Shellfish Allergy Rash        Medication List     STOP taking these medications    chlorthalidone 25 MG tablet Commonly known as: HYGROTON   colchicine  0.6 MG tablet   empagliflozin  25 MG Tabs tablet Commonly known as: Jardiance    furosemide  20 MG tablet Commonly known as: LASIX    hydrALAZINE  50 MG tablet Commonly known as: APRESOLINE    ofloxacin 0.3 % ophthalmic solution Commonly known as: OCUFLOX   prednisoLONE  acetate 1 % ophthalmic suspension Commonly known as: PRED FORTE    silver  sulfADIAZINE  1 % cream Commonly known as: Silvadene    spironolactone  25 MG tablet Commonly known as: ALDACTONE        TAKE these medications    acetaminophen  500 MG tablet Commonly known as: TYLENOL  Take 2 tablets (1,000 mg total) by mouth every 6 (six) hours.   albuterol  108 (90 Base) MCG/ACT inhaler Commonly known as: VENTOLIN  HFA Inhale 2 puffs into the lungs every 6 (six) hours as needed for wheezing or shortness of breath.   allopurinol  100 MG tablet  Commonly known as: ZYLOPRIM  TAKE 1/2 TABLET BY MOUTH DAILY   AMBULATORY NON FORMULARY MEDICATION Please provide rollator walker with hand brakes and seat.  Dx: R26.81 Gait instability   amiodarone  200 MG tablet Commonly known as: PACERONE  Take 200 mg by mouth daily. Dr.  Levern   amLODipine  10 MG tablet Commonly known as: NORVASC  TAKE 1 TABLET BY MOUTH EVERY DAY   aspirin  EC 81 MG tablet Take 1 tablet (81 mg total) by mouth 2 (two) times daily. For 6 weeks for DVT prophylaxis as per Ortho recs What changed:  when to take this additional instructions   Bromfenac Sodium 0.09 % Soln Place 1 drop into the right eye daily.   docusate sodium  100 MG capsule Commonly known as: COLACE Take 100 mg by mouth daily as needed for mild constipation.   ferrous sulfate  325 (65 FE) MG tablet Take 325 mg by mouth daily with breakfast.   FreeStyle Libre 3 Plus Sensor Misc CHANGE SENSOR EVERY 15 DAYS   glipiZIDE  2.5 MG Tabs Take 2.5 tablets by mouth at bedtime.   levofloxacin  500 MG tablet Commonly known as: LEVAQUIN  Take 1 tablet (500 mg total) by mouth every other day.   levothyroxine  75 MCG tablet Commonly known as: SYNTHROID  1 PILL EVERY MORNING EXCEPT SUNDAYS What changed: See the new instructions.   metoprolol  succinate 50 MG 24 hr tablet Commonly known as: TOPROL -XL Take 50 mg by mouth daily.   omeprazole  40 MG capsule Commonly known as: PRILOSEC TAKE 1 CAPSULE BY MOUTH EVERY MORNING AND AT BEDTIME What changed: See the new instructions.   oxyCODONE  5 MG immediate release tablet Commonly known as: Roxicodone  Take 1 tablet (5 mg total) by mouth every 6 (six) hours as needed for severe pain (pain score 7-10).   polyethylene glycol 17 g packet Commonly known as: MIRALAX  / GLYCOLAX  Take 17 g by mouth 2 (two) times daily.   rosuvastatin  40 MG tablet Commonly known as: CRESTOR  TAKE 1 TABLET BY MOUTH EVERY DAY   sitaGLIPtin  50 MG tablet Commonly known as: Januvia  Take 1 tablet (50 mg total) by mouth daily.   Vitamin D3 1.25 MG (50000 UT) Caps Take 50,000 Units by mouth once a week.        Contact information for follow-up providers     Melodi Lerner, MD Follow up in 2 week(s).   Specialty: Orthopedic Surgery Contact  information: [address] [place] [address] [phone]              Contact information for after-discharge care     Destination     [facility] .   Service: Skilled Nursing Contact information: [address] [place]  [address] [phone]                    Allergies[1]  Consultations: Orthopedics   Procedures/Studies: DG Sacrum/Coccyx Result Date: 05/14/2024 CLINICAL DATA:  Clemens on Friday, pain EXAM: SACRUM AND COCCYX - 2+ VIEW COMPARISON:  05/10/2024 FINDINGS: Frontal and lateral views of the sacrum and coccyx are obtained. There are no acute displaced sacral or coccygeal fractures. Sacroiliac joints are unremarkable. Bones are diffusely osteopenic. Bilateral hip arthroplasties are noted. IMPRESSION: 1. No evidence of sacrococcygeal fracture. Electronically Signed   By: Ozell Daring M.D.   On: 05/14/2024 20:08   CT Head Wo Contrast Result Date: 05/11/2024 EXAM: CT HEAD WITHOUT CONTRAST 05/10/2024 11:52:25 PM TECHNIQUE: CT of the head was performed without the administration of intravenous contrast. Automated exposure  control, iterative reconstruction, and/or weight based adjustment of the mA/kV was utilized to reduce the radiation dose to as low as reasonably achievable. COMPARISON: 2 / 25 / 25 CLINICAL HISTORY: Head trauma, minor (age >= 65y). FINDINGS: BRAIN AND VENTRICLES: No acute hemorrhage. No evidence of acute infarct. No hydrocephalus. No extra-axial collection. No mass effect or midline shift. Atherosclerotic calcifications within the cavernous internal carotid arteries. ORBITS: Bilateral lens replacement. SINUSES: Complete opacification of right maxillary sinus. SOFT TISSUES AND SKULL: No acute soft tissue abnormality. No skull fracture. IMPRESSION: 1. No acute intracranial abnormality. 2. Chronic right maxillary sinusitis. Electronically signed by: Norman Gatlin MD 05/11/2024 01:14 AM EST RP Workstation:  [id number]   CT Cervical Spine Wo Contrast Result Date: 05/11/2024 EXAM: CT CERVICAL SPINE WITHOUT CONTRAST 05/10/2024 11:52:25 PM TECHNIQUE: CT of the cervical spine was performed without the administration of intravenous contrast. Multiplanar reformatted images are provided for review. Automated exposure control, iterative reconstruction, and/or weight based adjustment of the mA/kV was utilized to reduce the radiation dose to as low as reasonably achievable. COMPARISON: Comparison with 05/10/2023. CLINICAL HISTORY: Neck trauma (Age >= 65y). Neck trauma. Age: >= 65 years. FINDINGS: BONES AND ALIGNMENT: No acute fracture or traumatic malalignment. DEGENERATIVE CHANGES: Multilevel age commensurate spondylosis. Advanced facet arthropathy on the right at C7-T1. No cervical spinal canal narrowing. SOFT TISSUES: No prevertebral soft tissue swelling. IMPRESSION: 1. No evidence of acute traumatic injury. Electronically signed by: Norman Gatlin MD 05/11/2024 01:12 AM EST RP Workstation: [id number]   CT Hip Right Wo Contrast Result Date: 05/11/2024 EXAM: CT OF THE RIGHT HIP WITHOUT IV CONTRAST 05/10/2024 11:52:25 PM TECHNIQUE: CT of the right hip was performed without the administration of intravenous contrast. Multiplanar reformatted images are provided for review. Automated exposure control, iterative reconstruction, and/or weight based adjustment of the mA/kV was utilized to reduce the radiation dose to as low as reasonably achievable. COMPARISON: Right hip X-ray 04/13/2024. CLINICAL HISTORY: Hip trauma, fracture suspected, X-ray done. FINDINGS: BONES: Right hip total arthroplasty is present in anatomic alignment. No evidence for hardware loosening. There is an acute comminuted fracture of the greater trochanter and anterior proximal diaphysis. Anterior osseous fragments are offset 1 cm anteriorly. No aggressive appearing osseous abnormality or periostitis. SOFT TISSUE: Peripheral vascular calcifications are  present. No significant soft tissue edema or fluid collections. No soft tissue mass. JOINT: Right hip total arthroplasty is present in anatomic alignment. No osseous erosions. INTRAPELVIC CONTENTS: Limited images of the intrapelvic contents are unremarkable. IMPRESSION: 1. Acute comminuted fracture of the greater trochanter and anterior proximal diaphysis with 1 cm anterior offset of osseous fragments. 2. Right hip total arthroplasty in anatomic alignment without evidence of hardware loosening. Electronically signed by: Greig Pique MD 05/11/2024 12:54 AM EST RP Workstation: [id number]   DG Hip Unilat  With Pelvis 2-3 Views Right Result Date: 05/10/2024 EXAM: 2 or 3 VIEW(S) XRAY OF THE PELVIS AND RIGHT HIP 05/10/2024 06:32:20 PM COMPARISON: Left hip CT 04/01/2020. CLINICAL HISTORY: Injury-related pain after a fall. FINDINGS: BONES AND JOINTS: SI joints are symmetric. Bilateral total hip arthroplasty is in place. Possible acute nondisplaced fracture of the right greater trochanter. Bilateral hips demonstrate normal alignment. SOFT TISSUES: Surgical clips are present in the lower pelvis. Vascular calcifications are noted. LUMBAR SPINE: Degenerative changes of the lumbar spine. IMPRESSION: 1. Possible acute nondisplaced fracture of the right greater trochanter. Electronically signed by: Greig Pique MD 05/10/2024 07:24 PM EST RP Workstation: [id number]      Subjective: Patient seen and examined at bedside.  Denies worsening shortness of breath, chest pain or vomiting.  Continues to have intermittent right hip pain.   Discharge Exam: Vitals:   05/15/24 2021 05/16/24 0507  BP: 136/73 129/60  Pulse: (!) 58 (!) 55  Resp: 18 18  Temp: 98 F (36.7 C) 97.9 F (36.6 C)  SpO2: 97% 100%    General: On room air.  No distress ENT/neck: No thyromegaly.  JVD is not elevated  respiratory: Decreased breath sounds at bases bilaterally with some crackles; no wheezing  CVS: S1-S2 heard, intermittently  bradycardic Abdominal: Soft, nontender, slightly distended; no organomegaly, bowel sounds are heard Extremities: Trace lower extremity edema; no cyanosis  CNS: Awake and alert.  No focal neurologic deficit.  Moves extremities Lymph: No obvious lymphadenopathy Skin: No obvious ecchymosis/lesions  psych: Mostly flat affect.  No signs of agitation     The results of significant diagnostics from this hospitalization (including imaging, microbiology, ancillary and laboratory) are listed below for reference.     Microbiology: No results found for this or any previous visit (from the past 240 hours).   Labs: BNP (last 3 results) No results for input(s): BNP in the last 8760 hours. Basic Metabolic Panel: Recent Labs  Lab 05/10/24 2308 05/12/24 0536 05/14/24 0507 05/15/24 0510 05/16/24 0514  NA 135 134* 134* 135 136  K 4.7 4.6 4.5 4.2 4.7  CL 99 101 99 101 103  CO2 21* 23 21* 22 22  GLUCOSE 166* 163* 136* 114* 133*  BUN 60* 57* 62* 66* 61*  CREATININE 3.57* 3.41* 3.13* 3.03* 2.89*  CALCIUM  9.2 9.0 9.0 8.8* 8.8*  MG  --   --   --   --  3.4*   Liver Function Tests: Recent Labs  Lab 05/10/24 2308  AST 18  ALT 16  ALKPHOS 70  BILITOT 0.4  PROT 8.9*  ALBUMIN 4.1   No results for input(s): LIPASE, AMYLASE in the last 168 hours. No results for input(s): AMMONIA in the last 168 hours. CBC: Recent Labs  Lab 05/10/24 2308 05/12/24 0536 05/14/24 0507 05/15/24 0510 05/16/24 0514  WBC 7.3 6.2 7.1 5.6 4.6  NEUTROABS 5.6  --  4.9 3.7 3.0  HGB 9.6* 8.6* 8.2* 7.7* 7.6*  HCT 28.0* 26.0* 25.3* 23.8* 22.9*  MCV 94.0 98.5 98.8 100.0 99.1  PLT 278 234 270 268 274   Cardiac Enzymes: No results for input(s): CKTOTAL, CKMB, CKMBINDEX, TROPONINI in the last 168 hours. BNP: Invalid input(s): POCBNP CBG: Recent Labs  Lab 05/14/24 0732 05/14/24 1118 05/14/24 1526 05/14/24 1959 05/14/24 2337  GLUCAP 168* 168* 127* 153* 130*   D-Dimer No results for input(s):  DDIMER in the last 72 hours. Hgb A1c No results for input(s): HGBA1C in the last 72 hours. Lipid Profile No results for input(s): CHOL, HDL, LDLCALC, TRIG, CHOLHDL, LDLDIRECT in the last 72 hours. Thyroid  function studies No results for input(s): TSH, T4TOTAL, T3FREE, THYROIDAB in the last 72 hours.  Invalid input(s): FREET3 Anemia work up No results for input(s): VITAMINB12, FOLATE, FERRITIN, TIBC, IRON, RETICCTPCT in the last 72 hours. Urinalysis    Component Value Date/Time   COLORURINE YELLOW 05/11/2024 0024   APPEARANCEUR CLEAR 05/11/2024 0024   LABSPEC 1.020 05/11/2024 0024   PHURINE 6.0 05/11/2024 0024   GLUCOSEU >1,000 (A) 05/11/2024 0024   HGBUR TRACE (A) 05/11/2024 0024   BILIRUBINUR NEGATIVE 05/11/2024 0024   BILIRUBINUR negative 02/21/2020 1132   KETONESUR NEGATIVE 05/11/2024 0024   PROTEINUR TRACE (A) 05/11/2024 0024   UROBILINOGEN 0.2 02/21/2020  1132   UROBILINOGEN 0.2 10/24/2014 1035   NITRITE NEGATIVE 05/11/2024 0024   LEUKOCYTESUR NEGATIVE 05/11/2024 0024   Sepsis Labs Recent Labs  Lab 05/12/24 0536 05/14/24 0507 05/15/24 0510 05/16/24 0514  WBC 6.2 7.1 5.6 4.6   Microbiology No results found for this or any previous visit (from the past 240 hours).   Time coordinating discharge: 35 minutes  SIGNED:   Sophie Mao, MD  [facility] 05/16/2024, 9:47 AM      [1]  Allergies Allergen Reactions   Shellfish Allergy Rash

## 2024-05-16 NOTE — TOC Transition Note (Signed)
 Transition of Care [facility]) - Discharge Note   Patient Details  Name: [name] MRN: [id number] Date of Birth: 04/30/1945  Transition of Care [facility]) CM/SW Contact:  Alfonse JONELLE Rex, RN Phone Number: 05/16/2024, 1:11 PM   Clinical Narrative:   DC to SNF-[facility]. RM 121[address], [facility] for transport. No further CM needs at this time.     Final next level of care: Skilled Nursing Facility Barriers to Discharge: Barriers Resolved   Patient Goals and CMS Choice Patient states their goals for this hospitalization and ongoing recovery are:: short term rehab CMS Medicare.gov Compare Post Acute Care list provided to:: Patient Choice offered to / list presented to : Patient [place] ownership interest in [facility].provided to:: Patient    Discharge Placement              Patient chooses bed at: [facility] Patient to be transferred to facility by: [facility] Name of family member notified: pt notified family Patient and family notified of of transfer: 05/16/24  Discharge Plan and Services Additional resources added to the After Visit Summary for                                       Social Drivers of Health (SDOH) Interventions SDOH Screenings   Food Insecurity: No Food Insecurity (05/11/2024)  Housing: Low Risk (05/11/2024)  Transportation Needs: No Transportation Needs (05/11/2024)  Utilities: Not At Risk (05/11/2024)  Alcohol  Screen: Low Risk (10/03/2023)  Depression (PHQ2-9): High Risk (01/10/2024)  Financial Resource Strain: Low Risk (10/03/2023)  Physical Activity: Sufficiently Active (10/03/2023)  Social Connections: Moderately Integrated (05/11/2024)  Stress: No Stress Concern Present (10/03/2023)  Tobacco Use: Medium Risk (05/10/2024)  Health Literacy: Adequate Health Literacy (10/03/2023)     Readmission Risk Interventions    05/14/2024    2:39 PM  Readmission Risk Prevention Plan  Transportation Screening Complete  PCP or Specialist  Appt within 5-7 Days Complete  Home Care Screening Complete  Medication Review (RN CM) Complete

## 2024-05-16 NOTE — TOC Progression Note (Signed)
 Transition of Care [facility]) - Progression Note    Patient Details  Name: [name] MRN: [id number] Date of Birth: 1945-09-15  [facility]) CM/SW Contact  Alfonse JONELLE Rex, RN Phone Number: 05/16/2024, 8:51 AM  Clinical Narrative:   Received voicemail from [facility] with [employer], confirmed non emergency ambulance approved. Auth ID [id number]    Expected Discharge Plan: Skilled Nursing Facility Barriers to Discharge: Continued Medical Work up               Expected Discharge Plan and Services       Living arrangements for the past 2 months: Single Family Home                                       Social Drivers of Health (SDOH) Interventions SDOH Screenings   Food Insecurity: No Food Insecurity (05/11/2024)  Housing: Low Risk (05/11/2024)  Transportation Needs: No Transportation Needs (05/11/2024)  Utilities: Not At Risk (05/11/2024)  Alcohol  Screen: Low Risk (10/03/2023)  Depression (PHQ2-9): High Risk (01/10/2024)  Financial Resource Strain: Low Risk (10/03/2023)  Physical Activity: Sufficiently Active (10/03/2023)  Social Connections: Moderately Integrated (05/11/2024)  Stress: No Stress Concern Present (10/03/2023)  Tobacco Use: Medium Risk (05/10/2024)  Health Literacy: Adequate Health Literacy (10/03/2023)    Readmission Risk Interventions    05/14/2024    2:39 PM  Readmission Risk Prevention Plan  Transportation Screening Complete  PCP or Specialist Appt within 5-7 Days Complete  Home Care Screening Complete  Medication Review (RN CM) Complete

## 2024-05-27 ENCOUNTER — Encounter (HOSPITAL_BASED_OUTPATIENT_CLINIC_OR_DEPARTMENT_OTHER): Admitting: Family

## 2024-07-10 ENCOUNTER — Ambulatory Visit: Admitting: Family Medicine

## 2024-07-11 ENCOUNTER — Ambulatory Visit: Admitting: Family Medicine

## 2024-07-18 ENCOUNTER — Ambulatory Visit: Admitting: "Endocrinology

## 2024-07-18 ENCOUNTER — Other Ambulatory Visit

## 2024-07-23 ENCOUNTER — Ambulatory Visit: Payer: Self-pay | Admitting: Infectious Disease

## 2024-10-03 ENCOUNTER — Ambulatory Visit
